# Patient Record
Sex: Male | Born: 1940
Health system: Southern US, Community
[De-identification: ages and names within clinical notes are randomized; demographics above are authoritative.]

## PROBLEM LIST (undated history)

## (undated) DIAGNOSIS — Z9289 Personal history of other medical treatment: Secondary | ICD-10-CM

## (undated) DIAGNOSIS — E781 Pure hyperglyceridemia: Secondary | ICD-10-CM

## (undated) DIAGNOSIS — I4891 Unspecified atrial fibrillation: Secondary | ICD-10-CM

## (undated) DIAGNOSIS — I350 Nonrheumatic aortic (valve) stenosis: Secondary | ICD-10-CM

## (undated) DIAGNOSIS — A0472 Enterocolitis due to Clostridium difficile, not specified as recurrent: Secondary | ICD-10-CM

## (undated) DIAGNOSIS — N183 Chronic kidney disease, stage 3 unspecified: Secondary | ICD-10-CM

## (undated) DIAGNOSIS — R011 Cardiac murmur, unspecified: Secondary | ICD-10-CM

## (undated) DIAGNOSIS — L400 Psoriasis vulgaris: Secondary | ICD-10-CM

## (undated) DIAGNOSIS — E785 Hyperlipidemia, unspecified: Secondary | ICD-10-CM

## (undated) DIAGNOSIS — R0600 Dyspnea, unspecified: Secondary | ICD-10-CM

## (undated) DIAGNOSIS — E1151 Type 2 diabetes mellitus with diabetic peripheral angiopathy without gangrene: Secondary | ICD-10-CM

## (undated) DIAGNOSIS — M1990 Unspecified osteoarthritis, unspecified site: Secondary | ICD-10-CM

## (undated) DIAGNOSIS — I509 Heart failure, unspecified: Secondary | ICD-10-CM

## (undated) DIAGNOSIS — Z860100 Personal history of colon polyps, unspecified: Secondary | ICD-10-CM

## (undated) DIAGNOSIS — I739 Peripheral vascular disease, unspecified: Secondary | ICD-10-CM

## (undated) DIAGNOSIS — K5520 Angiodysplasia of colon without hemorrhage: Secondary | ICD-10-CM

## (undated) DIAGNOSIS — I480 Paroxysmal atrial fibrillation: Secondary | ICD-10-CM

## (undated) DIAGNOSIS — K5521 Angiodysplasia of colon with hemorrhage: Secondary | ICD-10-CM

## (undated) DIAGNOSIS — I808 Phlebitis and thrombophlebitis of other sites: Secondary | ICD-10-CM

## (undated) DIAGNOSIS — E119 Type 2 diabetes mellitus without complications: Secondary | ICD-10-CM

## (undated) DIAGNOSIS — R7989 Other specified abnormal findings of blood chemistry: Secondary | ICD-10-CM

## (undated) DIAGNOSIS — I771 Stricture of artery: Secondary | ICD-10-CM

## (undated) DIAGNOSIS — I779 Disorder of arteries and arterioles, unspecified: Secondary | ICD-10-CM

## (undated) DIAGNOSIS — E039 Hypothyroidism, unspecified: Secondary | ICD-10-CM

## (undated) DIAGNOSIS — I2510 Atherosclerotic heart disease of native coronary artery without angina pectoris: Secondary | ICD-10-CM

## (undated) DIAGNOSIS — B958 Unspecified staphylococcus as the cause of diseases classified elsewhere: Secondary | ICD-10-CM

## (undated) DIAGNOSIS — L409 Psoriasis, unspecified: Secondary | ICD-10-CM

## (undated) DIAGNOSIS — I6529 Occlusion and stenosis of unspecified carotid artery: Secondary | ICD-10-CM

## (undated) DIAGNOSIS — I499 Cardiac arrhythmia, unspecified: Secondary | ICD-10-CM

## (undated) DIAGNOSIS — D509 Iron deficiency anemia, unspecified: Secondary | ICD-10-CM

## (undated) DIAGNOSIS — I5032 Chronic diastolic (congestive) heart failure: Secondary | ICD-10-CM

## (undated) DIAGNOSIS — I10 Essential (primary) hypertension: Secondary | ICD-10-CM

## (undated) DIAGNOSIS — R7881 Bacteremia: Secondary | ICD-10-CM

## (undated) DIAGNOSIS — T7840XA Allergy, unspecified, initial encounter: Secondary | ICD-10-CM

## (undated) DIAGNOSIS — I4819 Other persistent atrial fibrillation: Secondary | ICD-10-CM

## (undated) DIAGNOSIS — I214 Non-ST elevation (NSTEMI) myocardial infarction: Secondary | ICD-10-CM

## (undated) DIAGNOSIS — I5033 Acute on chronic diastolic (congestive) heart failure: Secondary | ICD-10-CM

## (undated) DIAGNOSIS — K219 Gastro-esophageal reflux disease without esophagitis: Secondary | ICD-10-CM

## (undated) HISTORY — DX: Chronic diastolic (congestive) heart failure: I50.32

## (undated) HISTORY — DX: Psoriasis vulgaris: L40.0

## (undated) HISTORY — DX: Personal history of colon polyps, unspecified: Z86.0100

## (undated) HISTORY — DX: Peripheral vascular disease, unspecified: I73.9

## (undated) HISTORY — DX: Acute on chronic diastolic (congestive) heart failure: I50.33

## (undated) HISTORY — DX: Nonrheumatic aortic (valve) stenosis: I35.0

## (undated) HISTORY — DX: Occlusion and stenosis of unspecified carotid artery: I65.29

## (undated) HISTORY — DX: Phlebitis and thrombophlebitis of other sites: I80.8

## (undated) HISTORY — DX: Stricture of artery: I77.1

## (undated) HISTORY — DX: Bacteremia: R78.81

## (undated) HISTORY — DX: Personal history of colonic polyps: Z86.010

## (undated) HISTORY — DX: Pure hyperglyceridemia: E78.1

## (undated) HISTORY — DX: Allergy, unspecified, initial encounter: T78.40XA

## (undated) HISTORY — DX: Unspecified osteoarthritis, unspecified site: M19.90

## (undated) HISTORY — DX: Paroxysmal atrial fibrillation: I48.0

## (undated) HISTORY — DX: Type 2 diabetes mellitus with diabetic peripheral angiopathy without gangrene: E11.51

## (undated) HISTORY — PX: KNEE ARTHROSCOPY: SUR90

## (undated) HISTORY — DX: Hypothyroidism, unspecified: E03.9

## (undated) HISTORY — DX: Angiodysplasia of colon with hemorrhage: K55.21

## (undated) HISTORY — DX: Other persistent atrial fibrillation: I48.19

## (undated) HISTORY — DX: Enterocolitis due to Clostridium difficile, not specified as recurrent: A04.72

## (undated) HISTORY — DX: Personal history of other medical treatment: Z92.89

## (undated) HISTORY — DX: Hyperlipidemia, unspecified: E78.5

## (undated) HISTORY — PX: SHOULDER OPEN ROTATOR CUFF REPAIR: SHX2407

## (undated) HISTORY — DX: Essential (primary) hypertension: I10

## (undated) HISTORY — DX: Other specified abnormal findings of blood chemistry: R79.89

## (undated) HISTORY — DX: Type 2 diabetes mellitus without complications: E11.9

## (undated) HISTORY — DX: Angiodysplasia of colon without hemorrhage: K55.20

## (undated) HISTORY — DX: Iron deficiency anemia, unspecified: D50.9

## (undated) HISTORY — DX: Atherosclerotic heart disease of native coronary artery without angina pectoris: I25.10

---

## 2001-05-13 ENCOUNTER — Encounter: Payer: Self-pay | Admitting: Emergency Medicine

## 2001-05-13 ENCOUNTER — Emergency Department (HOSPITAL_COMMUNITY): Admission: EM | Admit: 2001-05-13 | Discharge: 2001-05-13 | Payer: Self-pay | Admitting: Emergency Medicine

## 2003-11-29 ENCOUNTER — Emergency Department (HOSPITAL_COMMUNITY): Admission: EM | Admit: 2003-11-29 | Discharge: 2003-11-29 | Payer: Self-pay | Admitting: Emergency Medicine

## 2003-12-04 ENCOUNTER — Ambulatory Visit (HOSPITAL_COMMUNITY): Admission: RE | Admit: 2003-12-04 | Discharge: 2003-12-04 | Payer: Self-pay | Admitting: Specialist

## 2006-03-19 ENCOUNTER — Ambulatory Visit: Payer: Self-pay | Admitting: Internal Medicine

## 2006-04-09 ENCOUNTER — Ambulatory Visit: Payer: Self-pay | Admitting: Internal Medicine

## 2006-04-09 ENCOUNTER — Encounter (INDEPENDENT_AMBULATORY_CARE_PROVIDER_SITE_OTHER): Payer: Self-pay | Admitting: Specialist

## 2006-04-09 HISTORY — PX: COLONOSCOPY W/ POLYPECTOMY: SHX1380

## 2007-06-03 ENCOUNTER — Ambulatory Visit: Payer: Self-pay | Admitting: Internal Medicine

## 2007-06-17 ENCOUNTER — Encounter: Payer: Self-pay | Admitting: Internal Medicine

## 2007-06-17 ENCOUNTER — Ambulatory Visit: Payer: Self-pay | Admitting: Internal Medicine

## 2007-06-17 HISTORY — PX: COLONOSCOPY W/ POLYPECTOMY: SHX1380

## 2007-08-21 ENCOUNTER — Emergency Department (HOSPITAL_COMMUNITY): Admission: EM | Admit: 2007-08-21 | Discharge: 2007-08-21 | Payer: Self-pay | Admitting: Emergency Medicine

## 2008-06-06 ENCOUNTER — Emergency Department (HOSPITAL_COMMUNITY): Admission: EM | Admit: 2008-06-06 | Discharge: 2008-06-06 | Payer: Self-pay | Admitting: Emergency Medicine

## 2009-08-29 ENCOUNTER — Ambulatory Visit (HOSPITAL_COMMUNITY): Admission: RE | Admit: 2009-08-29 | Discharge: 2009-08-30 | Payer: Self-pay | Admitting: Orthopedic Surgery

## 2010-02-03 ENCOUNTER — Emergency Department (HOSPITAL_COMMUNITY): Admission: EM | Admit: 2010-02-03 | Discharge: 2010-02-03 | Payer: Self-pay | Admitting: Emergency Medicine

## 2010-06-18 ENCOUNTER — Encounter (INDEPENDENT_AMBULATORY_CARE_PROVIDER_SITE_OTHER): Payer: Self-pay | Admitting: *Deleted

## 2010-08-26 NOTE — Letter (Signed)
Summary: Colonoscopy Letter  [facility]  [place], [place] [address]   Phone: [phone]  Fax: [phone]      June 18, 2010 MRN: [id number]   MAKO ZHAO [address] [place],   [address]   Dear Mr. CORONA,   According to your medical record, it is time for you to schedule a Colonoscopy. The American Cancer Society recommends this procedure as a method to detect early colon cancer. Patients with a family history of colon cancer, or a personal history of colon polyps or inflammatory bowel disease are at increased risk.  This letter has been generated based on the recommendations made at the time of your procedure. If you feel that in your particular situation this may no longer apply, please contact our office.  Please call our office at [phone] to schedule this appointment or to update your records at your earliest convenience.  Thank you for cooperating with [name] to provide you with the very best care possible.   Sincerely,   Gatha Mayer, M.D.  [facility] [phone]

## 2010-10-16 LAB — CBC
HCT: 32.4 % — ABNORMAL LOW (ref 39.0–52.0)
MCHC: 37 g/dL — ABNORMAL HIGH (ref 30.0–36.0)
MCV: 81.9 fL (ref 78.0–100.0)
Platelets: 257 10*3/uL (ref 150–400)

## 2010-10-16 LAB — URINALYSIS, ROUTINE W REFLEX MICROSCOPIC
Bilirubin Urine: NEGATIVE
Glucose, UA: NEGATIVE mg/dL
Hgb urine dipstick: NEGATIVE
Ketones, ur: NEGATIVE mg/dL
Protein, ur: NEGATIVE mg/dL
Urobilinogen, UA: 0.2 mg/dL (ref 0.0–1.0)

## 2010-10-16 LAB — DIFFERENTIAL
Basophils Absolute: 0.1 10*3/uL (ref 0.0–0.1)
Eosinophils Relative: 2 % (ref 0–5)
Lymphocytes Relative: 17 % (ref 12–46)
Lymphs Abs: 0.8 10*3/uL (ref 0.7–4.0)
Neutro Abs: 3.2 10*3/uL (ref 1.7–7.7)

## 2010-10-16 LAB — APTT: aPTT: 33 seconds (ref 24–37)

## 2010-10-16 LAB — GLUCOSE, CAPILLARY
Glucose-Capillary: 163 mg/dL — ABNORMAL HIGH (ref 70–99)
Glucose-Capillary: 217 mg/dL — ABNORMAL HIGH (ref 70–99)

## 2010-10-16 LAB — COMPREHENSIVE METABOLIC PANEL
AST: 19 U/L (ref 0–37)
Albumin: 3.9 g/dL (ref 3.5–5.2)
BUN: 18 mg/dL (ref 6–23)
Calcium: 8.8 mg/dL (ref 8.4–10.5)
Chloride: 99 mEq/L (ref 96–112)
Creatinine, Ser: 0.94 mg/dL (ref 0.4–1.5)
GFR calc Af Amer: >60 mL/min (ref >60)
GFR calc non Af Amer: >60 mL/min (ref >60)
Total Bilirubin: 0.5 mg/dL (ref 0.3–1.2)

## 2010-10-16 LAB — PROTIME-INR: Prothrombin Time: 13.3 seconds (ref 11.6–15.2)

## 2011-01-02 ENCOUNTER — Encounter: Payer: Self-pay | Admitting: Internal Medicine

## 2011-01-27 ENCOUNTER — Ambulatory Visit (AMBULATORY_SURGERY_CENTER): Payer: Medicare Other | Admitting: *Deleted

## 2011-01-27 VITALS — Ht 71.0 in | Wt 254.3 lb

## 2011-01-27 DIAGNOSIS — Z860100 Personal history of colon polyps, unspecified: Secondary | ICD-10-CM

## 2011-01-27 DIAGNOSIS — Z1211 Encounter for screening for malignant neoplasm of colon: Secondary | ICD-10-CM

## 2011-01-27 MED ORDER — PEG-KCL-NACL-NASULF-NA ASC-C 100 G PO SOLR: ORAL | Status: DC

## 2011-02-09 ENCOUNTER — Telehealth: Payer: Self-pay | Admitting: Internal Medicine

## 2011-02-09 NOTE — Telephone Encounter (Signed)
Patient's wife states he had [place] food on Saturday and hot chocolate mix with water this am, no food this morning. Wife was asking could he still have exam. Explained to wife that as long as he starts clear liquid today and no solid foods today he should be okay to have exam. Explained to take prep like instructed and push fluids today and tonight. She understands. Sundra Aland

## 2011-02-10 ENCOUNTER — Encounter: Payer: Self-pay | Admitting: Internal Medicine

## 2011-02-10 ENCOUNTER — Ambulatory Visit (AMBULATORY_SURGERY_CENTER): Payer: Medicare Other | Admitting: Internal Medicine

## 2011-02-10 VITALS — BP 153/72 | HR 84 | Temp 98.5°F | Resp 18 | Ht 71.0 in | Wt 254.0 lb

## 2011-02-10 DIAGNOSIS — Z86010 Personal history of colonic polyps: Secondary | ICD-10-CM

## 2011-02-10 DIAGNOSIS — Z1211 Encounter for screening for malignant neoplasm of colon: Secondary | ICD-10-CM

## 2011-02-10 DIAGNOSIS — K648 Other hemorrhoids: Secondary | ICD-10-CM

## 2011-02-10 HISTORY — PX: COLONOSCOPY: SHX174

## 2011-02-10 MED ORDER — SODIUM CHLORIDE 0.9 % IV SOLN: 500.0000 mL | INTRAVENOUS | Status: DC

## 2011-02-10 NOTE — Patient Instructions (Signed)
Please refer to your blue and neon green sheets for instructions regarding diet and activity for the rest of today.  You may resume your medications as you would normally take them.  

## 2011-02-11 ENCOUNTER — Telehealth: Payer: Self-pay

## 2011-02-11 NOTE — Telephone Encounter (Signed)

## 2011-04-17 LAB — BASIC METABOLIC PANEL
CO2: 26
Chloride: 106
GFR calc Af Amer: >60
Potassium: 4.3
Sodium: 138

## 2011-09-29 DIAGNOSIS — R5383 Other fatigue: Secondary | ICD-10-CM | POA: Diagnosis not present

## 2011-09-29 DIAGNOSIS — Z6835 Body mass index (BMI) 35.0-35.9, adult: Secondary | ICD-10-CM | POA: Diagnosis not present

## 2011-09-29 DIAGNOSIS — E119 Type 2 diabetes mellitus without complications: Secondary | ICD-10-CM | POA: Diagnosis not present

## 2011-09-29 DIAGNOSIS — R5381 Other malaise: Secondary | ICD-10-CM | POA: Diagnosis not present

## 2011-09-29 DIAGNOSIS — L218 Other seborrheic dermatitis: Secondary | ICD-10-CM | POA: Diagnosis not present

## 2011-09-29 DIAGNOSIS — E039 Hypothyroidism, unspecified: Secondary | ICD-10-CM | POA: Diagnosis not present

## 2011-12-21 ENCOUNTER — Encounter (HOSPITAL_COMMUNITY): Payer: Self-pay | Admitting: Emergency Medicine

## 2011-12-21 ENCOUNTER — Inpatient Hospital Stay (HOSPITAL_COMMUNITY)
Admission: EM | Admit: 2011-12-21 | Discharge: 2011-12-23 | DRG: 812 | Disposition: A | Discharge status: Home / Self Care | Payer: Medicare Other | Attending: Internal Medicine | Admitting: Internal Medicine

## 2011-12-21 DIAGNOSIS — D649 Anemia, unspecified: Secondary | ICD-10-CM

## 2011-12-21 DIAGNOSIS — E1151 Type 2 diabetes mellitus with diabetic peripheral angiopathy without gangrene: Secondary | ICD-10-CM | POA: Diagnosis present

## 2011-12-21 DIAGNOSIS — E785 Hyperlipidemia, unspecified: Secondary | ICD-10-CM | POA: Diagnosis present

## 2011-12-21 DIAGNOSIS — E079 Disorder of thyroid, unspecified: Secondary | ICD-10-CM

## 2011-12-21 DIAGNOSIS — D509 Iron deficiency anemia, unspecified: Secondary | ICD-10-CM | POA: Diagnosis not present

## 2011-12-21 DIAGNOSIS — E869 Volume depletion, unspecified: Secondary | ICD-10-CM | POA: Diagnosis present

## 2011-12-21 DIAGNOSIS — E039 Hypothyroidism, unspecified: Secondary | ICD-10-CM | POA: Diagnosis present

## 2011-12-21 DIAGNOSIS — E119 Type 2 diabetes mellitus without complications: Secondary | ICD-10-CM | POA: Diagnosis not present

## 2011-12-21 DIAGNOSIS — E669 Obesity, unspecified: Secondary | ICD-10-CM | POA: Diagnosis present

## 2011-12-21 DIAGNOSIS — Z860100 Personal history of colon polyps, unspecified: Secondary | ICD-10-CM

## 2011-12-21 DIAGNOSIS — M109 Gout, unspecified: Secondary | ICD-10-CM | POA: Diagnosis present

## 2011-12-21 DIAGNOSIS — I10 Essential (primary) hypertension: Secondary | ICD-10-CM | POA: Diagnosis present

## 2011-12-21 DIAGNOSIS — Z7982 Long term (current) use of aspirin: Secondary | ICD-10-CM

## 2011-12-21 DIAGNOSIS — Z6832 Body mass index (BMI) 32.0-32.9, adult: Secondary | ICD-10-CM

## 2011-12-21 DIAGNOSIS — Z79899 Other long term (current) drug therapy: Secondary | ICD-10-CM

## 2011-12-21 DIAGNOSIS — R197 Diarrhea, unspecified: Secondary | ICD-10-CM

## 2011-12-21 DIAGNOSIS — M129 Arthropathy, unspecified: Secondary | ICD-10-CM | POA: Diagnosis present

## 2011-12-21 DIAGNOSIS — R000 Tachycardia, unspecified: Secondary | ICD-10-CM | POA: Diagnosis present

## 2011-12-21 DIAGNOSIS — I959 Hypotension, unspecified: Secondary | ICD-10-CM | POA: Diagnosis not present

## 2011-12-21 MED ORDER — SODIUM CHLORIDE 0.9 % IV BOLUS (SEPSIS)
1000.0000 mL | Freq: Once | INTRAVENOUS | Status: AC
  Administered 2011-12-22: 1000 mL via INTRAVENOUS

## 2011-12-21 MED ORDER — LOPERAMIDE HCL 2 MG PO CAPS
4.0000 mg | ORAL_CAPSULE | Freq: Once | ORAL | Status: AC
  Administered 2011-12-22: 4 mg via ORAL
  Filled 2011-12-21: qty 2

## 2011-12-21 NOTE — ED Provider Notes (Signed)
History     CSN: [id number]  Arrival date & time 12/21/11  1956   First MD Initiated Contact with Patient 12/21/11 2320      Chief Complaint  Patient presents with  . Dizziness    HPI  History provided by the patient and wife. Patient is a 71 year old male with history of hypertension, hyperlipidemia, diabetes who presents with complaints of lightheadedness and low blood pressure. Symptoms began earlier today and have been persistent. Patient also reports having history of multiple episodes of diarrhea over the past 2 days. Patient has been on several times an hour with loose watery stools. He denies any blood or mucus. Patient has had 2 episodes of diarrhea since waiting in the emergency room. His symptoms have been associated with slight nausea but he denies any other associated symptoms. Patient denies any fever, chills, sweats, vomiting or abdominal pains. Patient denies any chest pain or heart palpitations. Patient has continued to drink well. Patient is also taking his normal blood pressure medications regularly.    Past Medical History  Diagnosis Date  . Diabetes mellitus   . Hypertension   . Hyperlipidemia   . Gout   . Arthritis   . Thyroid disease     hypothyroidism  . Personal history of colonic polyps 2007, 2008    adenoma each time, largest 12 mm in 2007    Past Surgical History  Procedure Date  . Rotator cuff repair     left  . Knee arthroscopy     right  . Colonoscopy w/ polypectomy 04/09/2006    12 mm adenoma  . Colonoscopy w/ polypectomy 06/17/2007    5 mm adenoma  . Colonoscopy 02/10/2011    internal hemorrhoids    History reviewed. No pertinent family history.  History  Substance Use Topics  . Smoking status: Former Smoker    Quit date: 03/29/2006  . Smokeless tobacco: Not on file  . Alcohol Use: 0.6 oz/week    1 Cans of beer per week     occasional alcohol intake      Review of Systems  Constitutional: Positive for fever. Negative for  chills.  Respiratory: Negative for cough and shortness of breath.   Cardiovascular: Negative for chest pain.  Gastrointestinal: Positive for diarrhea. Negative for nausea, vomiting, abdominal pain and constipation.  Skin: Negative for rash.  Neurological: Positive for dizziness, light-headedness and headaches.    Allergies  Percocet  Home Medications   Current Outpatient Rx  Name Route Sig Dispense Refill  . ASPIRIN 81 MG PO TABS Oral Take 81 mg by mouth daily.    [name] CLONIDINE HCL 0.1 MG PO TABS Oral Take 0.1 mg by mouth 2 (two) times daily.    . IRON 325 (65 FE) MG PO TABS Oral Take 1 tablet by mouth daily.    [name] GLIPIZIDE 10 MG PO TABS Oral Take 10 mg by mouth daily.      [name] KRILL OIL 500 MG PO CAPS Oral Take 1 capsule by mouth daily.      [name] LEVOTHYROXINE SODIUM 150 MCG PO TABS Oral Take 150 mcg by mouth daily.    [name] LISINOPRIL-HYDROCHLOROTHIAZIDE 20-12.5 MG PO TABS Oral Take 1 tablet by mouth 2 (two) times daily.      [name] METFORMIN HCL 1000 MG PO TABS Oral Take 1,000 mg by mouth 2 (two) times daily with a meal.      . RED YEAST RICE PO Oral Take 1 tablet by mouth daily.  BP 116/63  Pulse 99  Temp(Src) 98.1 F (36.7 C) (Oral)  Resp 20  SpO2 99%  Physical Exam  Nursing note and vitals reviewed. Constitutional: He is oriented to person, place, and time. He appears well-developed and well-nourished. No distress.  HENT:  Head: Normocephalic and atraumatic.  Neck: Normal range of motion. Neck supple.       No meningeal signs  Cardiovascular: Normal rate and regular rhythm.   No murmur heard. Pulmonary/Chest: Effort normal and breath sounds normal.  Abdominal: Soft. He exhibits no distension. There is no tenderness. There is no rebound and no guarding.       Obese  Musculoskeletal: He exhibits no tenderness.  Neurological: He is alert and oriented to person, place, and time. He has normal strength. No cranial nerve deficit or sensory deficit.  Skin: Skin is warm. There is  pallor.  Psychiatric: He has a normal mood and affect. His behavior is normal.    ED Course  Procedures   Results for orders placed during the hospital encounter of 12/21/11  CBC      Component Value Range   WBC 5.3  4.0 - 10.5 (K/uL)   RBC 3.24 (*) 4.22 - 5.81 (MIL/uL)   Hemoglobin 8.5 (*) 13.0 - 17.0 (g/dL)   HCT 25.8 (*) 39.0 - 52.0 (%)   MCV 79.6  78.0 - 100.0 (fL)   MCH 26.2  26.0 - 34.0 (pg)   MCHC 32.9  30.0 - 36.0 (g/dL)   RDW 16.9 (*) 11.5 - 15.5 (%)   Platelets 206  150 - 400 (K/uL)  DIFFERENTIAL      Component Value Range   Neutrophils Relative 73  43 - 77 (%)   Neutro Abs 3.9  1.7 - 7.7 (K/uL)   Lymphocytes Relative 15  12 - 46 (%)   Lymphs Abs 0.8  0.7 - 4.0 (K/uL)   Monocytes Relative 9  3 - 12 (%)   Monocytes Absolute 0.5  0.1 - 1.0 (K/uL)   Eosinophils Relative 2  0 - 5 (%)   Eosinophils Absolute 0.1  0.0 - 0.7 (K/uL)   Basophils Relative 1  0 - 1 (%)   Basophils Absolute 0.0  0.0 - 0.1 (K/uL)  COMPREHENSIVE METABOLIC PANEL      Component Value Range   Sodium 138  135 - 145 (mEq/L)   Potassium 4.1  3.5 - 5.1 (mEq/L)   Chloride 103  96 - 112 (mEq/L)   CO2 22  19 - 32 (mEq/L)   Glucose, Bld 141 (*) 70 - 99 (mg/dL)   BUN 31 (*) 6 - 23 (mg/dL)   Creatinine, Ser 1.16  0.50 - 1.35 (mg/dL)   Calcium 9.1  8.4 - 10.5 (mg/dL)   Total Protein 7.2  6.0 - 8.3 (g/dL)   Albumin 3.5  3.5 - 5.2 (g/dL)   AST 14  0 - 37 (U/L)   ALT 10  0 - 53 (U/L)   Alkaline Phosphatase 93  39 - 117 (U/L)   Total Bilirubin 0.2 (*) 0.3 - 1.2 (mg/dL)   GFR calc non Af Amer 62 (*) >90 (mL/min)   GFR calc Af Amer 72 (*) >90 (mL/min)  BASIC METABOLIC PANEL      Component Value Range   Sodium 136  135 - 145 (mEq/L)   Potassium 4.2  3.5 - 5.1 (mEq/L)   Chloride 103  96 - 112 (mEq/L)   CO2 22  19 - 32 (mEq/L)   Glucose, Bld 186 (*)  70 - 99 (mg/dL)   BUN 26 (*) 6 - 23 (mg/dL)   Creatinine, Ser 1.07  0.50 - 1.35 (mg/dL)   Calcium 8.5  8.4 - 10.5 (mg/dL)   GFR calc non Af Amer 69 (*) >90  (mL/min)   GFR calc Af Amer 80 (*) >90 (mL/min)  TSH      Component Value Range   TSH 3.880  0.350 - 4.500 (uIU/mL)  VITAMIN B12      Component Value Range   Vitamin B-12 324  211 - 911 (pg/mL)  FOLATE      Component Value Range   Folate 10.3    IRON AND TIBC      Component Value Range   Iron 21 (*) 42 - 135 (ug/dL)   TIBC 265  215 - 435 (ug/dL)   Saturation Ratios 8 (*) 20 - 55 (%)   UIBC 244  125 - 400 (ug/dL)  RETICULOCYTES      Component Value Range   Retic Ct Pct 3.7 (*) 0.4 - 3.1 (%)   RBC. 2.81 (*) 4.22 - 5.81 (MIL/uL)   Retic Count, Manual 104.0  19.0 - 186.0 (K/uL)  CBC      Component Value Range   WBC 4.0  4.0 - 10.5 (K/uL)   RBC 2.81 (*) 4.22 - 5.81 (MIL/uL)   Hemoglobin 7.5 (*) 13.0 - 17.0 (g/dL)   HCT 22.3 (*) 39.0 - 52.0 (%)   MCV 79.4  78.0 - 100.0 (fL)   MCH 26.7  26.0 - 34.0 (pg)   MCHC 33.6  30.0 - 36.0 (g/dL)   RDW 17.0 (*) 11.5 - 15.5 (%)   Platelets 171  150 - 400 (K/uL)  FERRITIN      Component Value Range   Ferritin 557 (*) 22 - 322 (ng/mL)  PREPARE RBC (CROSSMATCH)      Component Value Range   Order Confirmation ORDER PROCESSED BY BLOOD BANK    TYPE AND SCREEN      Component Value Range   ABO/RH(D) A POS     Antibody Screen NEG     Sample Expiration 12/25/2011     Unit Number [id number]     Blood Component Type RED CELLS,LR     Unit division 00     Status of Unit ISSUED     Transfusion Status OK TO TRANSFUSE     Crossmatch Result Compatible     Unit Number [id number]     Blood Component Type RED CELLS,LR     Unit division 00     Status of Unit ISSUED     Transfusion Status OK TO TRANSFUSE     Crossmatch Result Compatible    ABO/RH      Component Value Range   ABO/RH(D) A POS    MAGNESIUM      Component Value Range   Magnesium 1.7  1.5 - 2.5 (mg/dL)  OCCULT BLOOD, POC DEVICE      Component Value Range   Fecal Occult Bld NEGATIVE         Ct Abdomen Pelvis Wo Contrast  12/22/2011  *RADIOLOGY REPORT*  Clinical Data: Severe  diarrhea and unexplained anemia. Hypotension.  CT ABDOMEN AND PELVIS WITHOUT CONTRAST  Technique:  Multidetector CT imaging of the abdomen and pelvis was performed following the standard protocol without intravenous contrast.  Comparison: None.  Findings: The lung bases are clear.  The unenhanced appearance of the liver, spleen, pancreas, adrenal glands, kidneys, abdominal aorta, and retroperitoneal lymph nodes is unremarkable.  There appear to be a vague low attenuation changes in the kidneys likely representing small cysts.  Cholelithiasis with large stones in the gallbladder neck region.  No gallbladder distension or and edema. The stomach and small bowel are decompressed.  Stool filled colon without distension.  No free air or free fluid in the abdomen. Prominent visceral adipose tissues.  Pelvis:  Calcification of the prostate gland without significant enlargement.  No bladder wall thickening.  Diverticula in the sigmoid colon without inflammation.  No free or loculated pelvic fluid collections.  The appendix is normal.  No retroperitoneal hematomas.  Normal alignment of the lumbar vertebrae with mild degenerative change.  IMPRESSION: Cholelithiasis.  The no acute process demonstrated in the abdomen pelvis.  No evidence of retroperitoneal or abdominal hematoma.  Original Report Authenticated By: Neale Burly, M.D.     1. Anemia   2. Diarrhea   3. DM (diabetes mellitus)   4. Iron deficiency anemia, unspecified       MDM  Patient seen and evaluated. Patient in no acute distress.  Patient with slight improvement of symptoms with fluids but still feeling very weak and fatigued. Hemoglobin low at concerning. Patient with negative Hemoccult. Patient discussed with attending physician and will plan for admission.   Spoke with triad hospitalist. They'll see patient and admit.     Date: 12/22/2011  Rate: 90  Rhythm: normal sinus rhythm  QRS Axis: normal  Intervals: normal  ST/T Wave  abnormalities: nonspecific T wave changes  Conduction Disutrbances:none  Narrative Interpretation:   Old EKG Reviewed: unchanged from 08/28/2009    Martie Lee, [place] 12/22/11 2315

## 2011-12-21 NOTE — ED Notes (Signed)
Pt family states pt was out in the yard and became dizzy so she took him in the house and checked his blood sugar which was normal and so she checked his blood pressure and thought it to be low  Pt states he continues to have light headedness   Pt is also c/o headache and states on the way here he started having some nausea

## 2011-12-21 NOTE — ED Notes (Signed)
Pt also reveals that he has been having more urgency with his urine lately as well.

## 2011-12-21 NOTE — ED Notes (Signed)
Pt sts he has been having severe diarrhea x 4 days. Was feeling weak earlier today with lower than normal blood pressure. Sts that he has been feeling nauseous today as well, no emesis. NAD. O2 sat 100% on room air. No other complaints at this time.

## 2011-12-22 ENCOUNTER — Inpatient Hospital Stay (HOSPITAL_COMMUNITY): Payer: Medicare Other

## 2011-12-22 ENCOUNTER — Encounter (HOSPITAL_COMMUNITY): Payer: Self-pay | Admitting: Internal Medicine

## 2011-12-22 DIAGNOSIS — E1151 Type 2 diabetes mellitus with diabetic peripheral angiopathy without gangrene: Secondary | ICD-10-CM | POA: Diagnosis present

## 2011-12-22 DIAGNOSIS — I959 Hypotension, unspecified: Secondary | ICD-10-CM | POA: Diagnosis not present

## 2011-12-22 DIAGNOSIS — R197 Diarrhea, unspecified: Secondary | ICD-10-CM

## 2011-12-22 DIAGNOSIS — Z7982 Long term (current) use of aspirin: Secondary | ICD-10-CM | POA: Diagnosis not present

## 2011-12-22 DIAGNOSIS — E869 Volume depletion, unspecified: Secondary | ICD-10-CM | POA: Diagnosis present

## 2011-12-22 DIAGNOSIS — I10 Essential (primary) hypertension: Secondary | ICD-10-CM | POA: Diagnosis present

## 2011-12-22 DIAGNOSIS — E119 Type 2 diabetes mellitus without complications: Secondary | ICD-10-CM

## 2011-12-22 DIAGNOSIS — M129 Arthropathy, unspecified: Secondary | ICD-10-CM | POA: Diagnosis present

## 2011-12-22 DIAGNOSIS — M109 Gout, unspecified: Secondary | ICD-10-CM | POA: Diagnosis present

## 2011-12-22 DIAGNOSIS — E669 Obesity, unspecified: Secondary | ICD-10-CM | POA: Diagnosis present

## 2011-12-22 DIAGNOSIS — D509 Iron deficiency anemia, unspecified: Secondary | ICD-10-CM | POA: Diagnosis not present

## 2011-12-22 DIAGNOSIS — E079 Disorder of thyroid, unspecified: Secondary | ICD-10-CM | POA: Diagnosis present

## 2011-12-22 DIAGNOSIS — E860 Dehydration: Secondary | ICD-10-CM | POA: Diagnosis not present

## 2011-12-22 DIAGNOSIS — Z79899 Other long term (current) drug therapy: Secondary | ICD-10-CM | POA: Diagnosis not present

## 2011-12-22 DIAGNOSIS — Z86010 Personal history of colonic polyps: Secondary | ICD-10-CM | POA: Diagnosis not present

## 2011-12-22 DIAGNOSIS — R112 Nausea with vomiting, unspecified: Secondary | ICD-10-CM | POA: Diagnosis not present

## 2011-12-22 DIAGNOSIS — R42 Dizziness and giddiness: Secondary | ICD-10-CM | POA: Diagnosis not present

## 2011-12-22 DIAGNOSIS — E039 Hypothyroidism, unspecified: Secondary | ICD-10-CM | POA: Diagnosis present

## 2011-12-22 DIAGNOSIS — E785 Hyperlipidemia, unspecified: Secondary | ICD-10-CM | POA: Diagnosis present

## 2011-12-22 DIAGNOSIS — D649 Anemia, unspecified: Secondary | ICD-10-CM | POA: Diagnosis not present

## 2011-12-22 DIAGNOSIS — R000 Tachycardia, unspecified: Secondary | ICD-10-CM | POA: Diagnosis present

## 2011-12-22 DIAGNOSIS — K8020 Calculus of gallbladder without cholecystitis without obstruction: Secondary | ICD-10-CM | POA: Diagnosis not present

## 2011-12-22 DIAGNOSIS — Z6832 Body mass index (BMI) 32.0-32.9, adult: Secondary | ICD-10-CM | POA: Diagnosis not present

## 2011-12-22 LAB — VITAMIN B12: Vitamin B-12: 324 pg/mL (ref 211–911)

## 2011-12-22 LAB — RETICULOCYTES
RBC.: 2.81 MIL/uL — ABNORMAL LOW (ref 4.22–5.81)
Retic Count, Absolute: 104 10*3/uL (ref 19.0–186.0)

## 2011-12-22 LAB — DIFFERENTIAL
Basophils Relative: 1 % (ref 0–1)
Lymphocytes Relative: 15 % (ref 12–46)
Lymphs Abs: 0.8 10*3/uL (ref 0.7–4.0)
Monocytes Absolute: 0.5 10*3/uL (ref 0.1–1.0)
Monocytes Relative: 9 % (ref 3–12)
Neutro Abs: 3.9 10*3/uL (ref 1.7–7.7)

## 2011-12-22 LAB — CBC
HCT: 22.3 % — ABNORMAL LOW (ref 39.0–52.0)
HCT: 25.8 % — ABNORMAL LOW (ref 39.0–52.0)
Hemoglobin: 8.5 g/dL — ABNORMAL LOW (ref 13.0–17.0)
MCHC: 32.9 g/dL (ref 30.0–36.0)
RDW: 17 % — ABNORMAL HIGH (ref 11.5–15.5)
WBC: 4 10*3/uL (ref 4.0–10.5)

## 2011-12-22 LAB — IRON AND TIBC: UIBC: 244 ug/dL (ref 125–400)

## 2011-12-22 LAB — COMPREHENSIVE METABOLIC PANEL
BUN: 31 mg/dL — ABNORMAL HIGH (ref 6–23)
CO2: 22 mEq/L (ref 19–32)
Chloride: 103 mEq/L (ref 96–112)
Creatinine, Ser: 1.16 mg/dL (ref 0.50–1.35)
GFR calc Af Amer: 72 mL/min — ABNORMAL LOW (ref >90)
GFR calc non Af Amer: 62 mL/min — ABNORMAL LOW (ref >90)
Glucose, Bld: 141 mg/dL — ABNORMAL HIGH (ref 70–99)
Total Bilirubin: 0.2 mg/dL — ABNORMAL LOW (ref 0.3–1.2)

## 2011-12-22 LAB — BASIC METABOLIC PANEL
BUN: 26 mg/dL — ABNORMAL HIGH (ref 6–23)
Calcium: 8.5 mg/dL (ref 8.4–10.5)
GFR calc Af Amer: 80 mL/min — ABNORMAL LOW (ref >90)
GFR calc non Af Amer: 69 mL/min — ABNORMAL LOW (ref >90)
Potassium: 4.2 mEq/L (ref 3.5–5.1)
Sodium: 136 mEq/L (ref 135–145)

## 2011-12-22 LAB — PREPARE RBC (CROSSMATCH)

## 2011-12-22 LAB — MAGNESIUM: Magnesium: 1.7 mg/dL (ref 1.5–2.5)

## 2011-12-22 LAB — FERRITIN: Ferritin: 557 ng/mL — ABNORMAL HIGH (ref 22–322)

## 2011-12-22 LAB — FOLATE: Folate: 10.3 ng/mL

## 2011-12-22 MED ORDER — DEXTROSE-NACL 5-0.9 % IV SOLN
INTRAVENOUS | Status: DC
  Administered 2011-12-22: 05:00:00 via INTRAVENOUS

## 2011-12-22 MED ORDER — ONDANSETRON HCL 4 MG/2ML IJ SOLN: 4.0000 mg | Freq: Three times a day (TID) | INTRAMUSCULAR | Status: AC | PRN

## 2011-12-22 MED ORDER — PANTOPRAZOLE SODIUM 40 MG PO TBEC
40.0000 mg | DELAYED_RELEASE_TABLET | Freq: Every day | ORAL | Status: DC
  Administered 2011-12-22 – 2011-12-23 (×2): 40 mg via ORAL
  Filled 2011-12-22 (×2): qty 1

## 2011-12-22 MED ORDER — ACETAMINOPHEN 325 MG PO TABS
650.0000 mg | ORAL_TABLET | Freq: Once | ORAL | Status: AC
  Administered 2011-12-22: 650 mg via ORAL
  Filled 2011-12-22: qty 2

## 2011-12-22 MED ORDER — KETOROLAC TROMETHAMINE 30 MG/ML IJ SOLN
30.0000 mg | Freq: Once | INTRAMUSCULAR | Status: AC
  Administered 2011-12-22: 30 mg via INTRAVENOUS
  Filled 2011-12-22: qty 1

## 2011-12-22 MED ORDER — DIPHENHYDRAMINE HCL 50 MG/ML IJ SOLN
25.0000 mg | Freq: Once | INTRAMUSCULAR | Status: AC
  Administered 2011-12-22: 25 mg via INTRAVENOUS
  Filled 2011-12-22: qty 1

## 2011-12-22 MED ORDER — SODIUM CHLORIDE 0.9 % IJ SOLN
3.0000 mL | Freq: Two times a day (BID) | INTRAMUSCULAR | Status: DC
  Administered 2011-12-22 – 2011-12-23 (×3): 3 mL via INTRAVENOUS

## 2011-12-22 MED ORDER — LEVOTHYROXINE SODIUM 150 MCG PO TABS
150.0000 ug | ORAL_TABLET | Freq: Every day | ORAL | Status: DC
  Administered 2011-12-22 – 2011-12-23 (×2): 150 ug via ORAL
  Filled 2011-12-22 (×5): qty 1

## 2011-12-22 MED ORDER — METOCLOPRAMIDE HCL 5 MG/ML IJ SOLN
10.0000 mg | Freq: Once | INTRAMUSCULAR | Status: AC
  Administered 2011-12-22: 10 mg via INTRAVENOUS
  Filled 2011-12-22: qty 2

## 2011-12-22 NOTE — ED Notes (Signed)
Pt hemoccult card neg

## 2011-12-22 NOTE — Progress Notes (Signed)
Pt  Having 3 beats of vt freq  About every minute or so.  Vs 147/64, 99.3, 103, 97% RA, and 18 resp.  infom sent via text page to Dr Broadus John.

## 2011-12-22 NOTE — H&P (Signed)
PCP:   [name], MD, MD   Chief Complaint: Dizziness   HPI: Michael [name] is an 71 y.o. male with history of hypertension on several medications including diuretic, history of diabetes, hyperlipidemia, hypothyroidism on supplement, colonic polyp, iron deficiency anemia on supplement, present to the emergency room as his wife check his blood pressure and found it to be in the 0000000 systolic. He also experienced lightheadedness but no frank syncope. Evaluation in emergency room did confirm hypotension, but responded promptly to IV fluid. He was found to have a hemoglobin of 8.5 g per decaliter, with MCV of 79, and black stool but guaiac negative. He has no abdominal pain, nausea, vomiting, but admitted to having severe diarrhea for several days. He was also found to have slight elevation of BUN to 31, normal creatinine. He has normal platelet count and normal white count as well.  There was no recent CBC to compare. He stated that he had colonoscopy about a year or so ago and no polyps or cancer was found. Hospitalist was asked to admit him for volume depletion, anemia, and hypotension.  Rewiew of Systems:  The patient denies anorexia, fever, weight loss,, vision loss, decreased hearing, hoarseness, chest pain, syncope, dyspnea on exertion, peripheral edema, balance deficits, hemoptysis, abdominal pain, , hematochezia, severe indigestion/heartburn, hematuria, incontinence, genital sores, muscle weakness, suspicious skin lesions, transient blindness, difficulty walking, depression, unusual weight change, abnormal bleeding, enlarged lymph nodes, angioedema, and breast masses.    Past Medical History  Diagnosis Date  . Diabetes mellitus   . Hypertension   . Hyperlipidemia   . Gout   . Arthritis   . Thyroid disease     hypothyroidism  . Personal history of colonic polyps 2007, 2008    adenoma each time, largest 12 mm in 2007    Past Surgical History  Procedure Date  . Rotator cuff repair       left  . Knee arthroscopy     right  . Colonoscopy w/ polypectomy 04/09/2006    12 mm adenoma  . Colonoscopy w/ polypectomy 06/17/2007    5 mm adenoma  . Colonoscopy 02/10/2011    internal hemorrhoids    Medications:  HOME MEDS: Prior to Admission medications   Medication Sig Start Date End Date Taking? Authorizing Provider  aspirin 81 MG tablet Take 81 mg by mouth daily.   Yes Historical Provider, MD  cloNIDine (CATAPRES) 0.1 MG tablet Take 0.1 mg by mouth 2 (two) times daily.   Yes Historical Provider, MD  Ferrous Sulfate (IRON) 325 (65 FE) MG TABS Take 1 tablet by mouth daily.   Yes Historical Provider, MD  glipiZIDE (GLUCOTROL) 10 MG tablet Take 10 mg by mouth daily.     Yes Historical Provider, MD  Javier Docker Oil 500 MG CAPS Take 1 capsule by mouth daily.     Yes Historical Provider, MD  levothyroxine (SYNTHROID, LEVOTHROID) 150 MCG tablet Take 150 mcg by mouth daily.   Yes Historical Provider, MD  lisinopril-hydrochlorothiazide (PRINZIDE,ZESTORETIC) 20-12.5 MG per tablet Take 1 tablet by mouth 2 (two) times daily.     Yes Historical Provider, MD  metFORMIN (GLUCOPHAGE) 1000 MG tablet Take 1,000 mg by mouth 2 (two) times daily with a meal.     Yes Historical Provider, MD  Red Yeast Rice Extract (RED YEAST RICE PO) Take 1 tablet by mouth daily.     Yes Historical Provider, MD     Allergies:  Allergies  Allergen Reactions  . Percocet (Oxycodone-Acetaminophen) Nausea  And Vomiting    Social History:   reports that he quit smoking about 5 years ago. He does not have any smokeless tobacco history on file. He reports that he drinks about .6 ounces of alcohol per week. He reports that he does not use illicit drugs.  Family History: History reviewed. No pertinent family history.   Physical Exam: Filed Vitals:   12/21/11 2307 12/21/11 2308 12/22/11 0419 12/22/11 0430  BP: 99/70 116/63 171/80 150/67  Pulse: 95 99 114   Temp:   98.3 F (36.8 C)   TempSrc:   Oral   Resp:   20    Height:   6' (1.829 m)   Weight:   112.3 kg (247 lb 9.2 oz)   SpO2:   98%    Blood pressure 150/67, pulse 114, temperature 98.3 F (36.8 C), temperature source Oral, resp. rate 20, height 6' (1.829 m), weight 112.3 kg (247 lb 9.2 oz), SpO2 98.00%.  GEN:  Pleasant person lying in the stretcher in no acute distress; cooperative with exam PSYCH:  alert and oriented x4; does not appear anxious or depressed; affect is appropriate. HEENT: Mucous membranes pink and anicteric; PERRLA; EOM intact; no cervical lymphadenopathy nor thyromegaly or carotid bruit; no JVD; Breasts:: Not examined CHEST WALL: No tenderness CHEST: Normal respiration, clear to auscultation bilaterally HEART: Regular rate and rhythm; no murmurs rubs or gallops BACK: No kyphosis or scoliosis; no CVA tenderness ABDOMEN: Obese, soft non-tender; no masses, no organomegaly, normal abdominal bowel sounds; no pannus; no intertriginous candida. Rectal Exam: Per ER, dark stool guaiac-negative. EXTREMITIES: No bone or joint deformity; age-appropriate arthropathy of the hands and knees; no edema; no ulcerations. Genitalia: not examined PULSES: 2+ and symmetric SKIN: Normal hydration no rash or ulceration CNS: Cranial nerves 2-12 grossly intact no focal lateralizing neurologic deficit   Labs & Imaging Results for orders placed during the hospital encounter of 12/21/11 (from the past 48 hour(s))  CBC     Status: Abnormal   Collection Time   12/22/11 12:23 AM      Component Value Range Comment   WBC 5.3  4.0 - 10.5 (K/uL)    RBC 3.24 (*) 4.22 - 5.81 (MIL/uL)    Hemoglobin 8.5 (*) 13.0 - 17.0 (g/dL)    HCT 25.8 (*) 39.0 - 52.0 (%)    MCV 79.6  78.0 - 100.0 (fL)    MCH 26.2  26.0 - 34.0 (pg)    MCHC 32.9  30.0 - 36.0 (g/dL)    RDW 16.9 (*) 11.5 - 15.5 (%)    Platelets 206  150 - 400 (K/uL)   DIFFERENTIAL     Status: Normal   Collection Time   12/22/11 12:23 AM      Component Value Range Comment   Neutrophils Relative 73  43 -  77 (%)    Neutro Abs 3.9  1.7 - 7.7 (K/uL)    Lymphocytes Relative 15  12 - 46 (%)    Lymphs Abs 0.8  0.7 - 4.0 (K/uL)    Monocytes Relative 9  3 - 12 (%)    Monocytes Absolute 0.5  0.1 - 1.0 (K/uL)    Eosinophils Relative 2  0 - 5 (%)    Eosinophils Absolute 0.1  0.0 - 0.7 (K/uL)    Basophils Relative 1  0 - 1 (%)    Basophils Absolute 0.0  0.0 - 0.1 (K/uL)   COMPREHENSIVE METABOLIC PANEL     Status: Abnormal   Collection Time  12/22/11 12:23 AM      Component Value Range Comment   Sodium 138  135 - 145 (mEq/L)    Potassium 4.1  3.5 - 5.1 (mEq/L)    Chloride 103  96 - 112 (mEq/L)    CO2 22  19 - 32 (mEq/L)    Glucose, Bld 141 (*) 70 - 99 (mg/dL)    BUN 31 (*) 6 - 23 (mg/dL)    Creatinine, Ser 1.16  0.50 - 1.35 (mg/dL)    Calcium 9.1  8.4 - 10.5 (mg/dL)    Total Protein 7.2  6.0 - 8.3 (g/dL)    Albumin 3.5  3.5 - 5.2 (g/dL)    AST 14  0 - 37 (U/L)    ALT 10  0 - 53 (U/L)    Alkaline Phosphatase 93  39 - 117 (U/L)    Total Bilirubin 0.2 (*) 0.3 - 1.2 (mg/dL)    GFR calc non Af Amer 62 (*) >90 (mL/min)    GFR calc Af Amer 72 (*) >90 (mL/min)    No results found.    Assessment Present on Admission:  .Hypotension .DM (diabetes mellitus) .Thyroid disease Unexplained anemia Dehydration diarrhea  PLAN: Will admit him to telemetry, and perform serial H&H every 6 hours. I will put him on a PPI. Will do anemia workup. I suspect he'll need a GI consult for possible EGD. Although his abdominal exam is benign, since the anemia is unexplained, I will obtain a plain abdominal CT. Because of his diarrhea, will send stool for study including PCR C. difficile, along with routine stool culture. With dehydration, and receiving IV fluid, it's quite possible his hemoglobin will drop further. With respect to his diabetes, we'll stop his medication given he is made n.p.o., and insulin sliding scale will be given. For his thyroid disease, we'll continue supplements and check TSH. He is a bit  tachycardic, which I suspect is volume depleted, and will follow with fluid replacement. He is stable, full code, and will be admitted to telemetry under triad hospitalist service.  I have stopped his diuretic along with his other antihypertensive medications for now pending further evaluation. I have also stopped his iron in case he needs an EGD.   Other plans as per orders.    Autie Vasudevan 12/22/2011, 4:33 AM

## 2011-12-22 NOTE — Progress Notes (Addendum)
Inpatient Diabetes Program Recommendations  AACE/ADA: New Consensus Statement on Inpatient Glycemic Control (2009)  Target Ranges:  Prepandial:   less than 140 mg/dL      Peak postprandial:   less than 180 mg/dL (1-2 hours)      Critically ill patients:  140 - 180 mg/dL   Reason for Visit: Pt has diabetes and takes Glipizide at home.  Inpatient Diabetes Program Recommendations Correction (SSI): Please check cbg's and use sensitive correction insulin Novolog scale tidwc. (Can use q 4hrs when NPO)  Note: Thank you, Rosita Kea, RN, CNS, Diabetes Coordinator [phone])

## 2011-12-22 NOTE — Progress Notes (Signed)
Pt seen and examined, admitted this am by Dr.Le Feels weak, but denies any other complaints No further diarrhea H/o Iron deficiency anemia, chronic intermittent diarrhea or constipation Colonoscopy normal in 7/12 by Dr.[name] CT Abd pelvis normal, heme negative black stool in ER Anemia panel pending, Transfuse 1unit PRBC today Will request GI eval for possible EGD/capsule endo Hold BP meds today Was made NPO by admitter for possible endoscopy will DC this.  [facility] [phone]

## 2011-12-22 NOTE — ED Notes (Signed)
Report called to 5E. Patient stable at this time. NAD. Patient taken to floor by Elveria Rising RN.

## 2011-12-22 NOTE — Consult Note (Signed)
Referring Provider: Dr.Joseph-TriadPrimary Care Physician:  [name], MD, MD Primary Gastroenterologist:  Dr.[name]  Reason for Consultation:  [name] deficiency,diarrhea  HPI: Michael [name] is a 71 y.o. male with hx of HTN, Hypothyroidism,AODM, arthritis and hx of colon polyps.He had colonoscopy in 2007,and in 2008 with adenom,atous polyps. Last colon was done in 01/2011 -no recurrent polyps,did have int . Hemorrhoids. Pt 's wife says he has had chronic problems with loose stools for several years-usually post prandial urgency with 3-4 Bm's per day. He has intermittent cramping and discomfort. Appetite good, weight stable. No changes in meds for some time. Wife says diarrhea has been no different in past several weeks. He was told by his primary MD Dr. [name] in March that he was Fe deficient, and was started on oral iron 325 mg daily.  Patient says his stools have been dark since starting iron-no change in diarrhea over the past couple weeks. Patient had complained of dizziness and lightheadedness early today, wife  checked his blood pressure which was 90 systolic and therefore, brought here to the emergency room.  In ER pt documented heme negative.  Labs show hgb 7.5/hct 22.3,mcv 79, lft's wnl. Hgb in feb 2011 was 12.  Past Medical History  Diagnosis Date  . Diabetes mellitus   . Hypertension   . Hyperlipidemia   . Gout   . Arthritis   . Thyroid disease     hypothyroidism  . Personal history of colonic polyps 2007, 2008    adenoma each time, largest 12 mm in 2007    Past Surgical History  Procedure Date  . Rotator cuff repair     left  . Knee arthroscopy     right  . Colonoscopy w/ polypectomy 04/09/2006    12 mm adenoma  . Colonoscopy w/ polypectomy 06/17/2007    5 mm adenoma  . Colonoscopy 02/10/2011    internal hemorrhoids    Prior to Admission medications   Medication Sig Start Date End Date Taking? Authorizing Provider  aspirin 81 MG tablet Take 81 mg by  mouth daily.   Yes Historical Provider, MD  cloNIDine (CATAPRES) 0.1 MG tablet Take 0.1 mg by mouth 2 (two) times daily.   Yes Historical Provider, MD  Ferrous Sulfate (IRON) 325 (65 FE) MG TABS Take 1 tablet by mouth daily.   Yes Historical Provider, MD  glipiZIDE (GLUCOTROL) 10 MG tablet Take 10 mg by mouth daily.     Yes Historical Provider, MD  Javier Docker Oil 500 MG CAPS Take 1 capsule by mouth daily.     Yes Historical Provider, MD  levothyroxine (SYNTHROID, LEVOTHROID) 150 MCG tablet Take 150 mcg by mouth daily.   Yes Historical Provider, MD  lisinopril-hydrochlorothiazide (PRINZIDE,ZESTORETIC) 20-12.5 MG per tablet Take 1 tablet by mouth 2 (two) times daily.     Yes Historical Provider, MD  metFORMIN (GLUCOPHAGE) 1000 MG tablet Take 1,000 mg by mouth 2 (two) times daily with a meal.     Yes Historical Provider, MD  Red Yeast Rice Extract (RED YEAST RICE PO) Take 1 tablet by mouth daily.     Yes Historical Provider, MD    Current Facility-Administered Medications  Medication Dose Route Frequency Provider Last Rate Last Dose  . acetaminophen (TYLENOL) tablet 650 mg  650 mg Oral Once Orvan Falconer, MD   650 mg at 12/22/11 0905  . diphenhydrAMINE (BENADRYL) injection 25 mg  25 mg Intravenous Once Orvan Falconer, MD   25 mg at 12/22/11 0905  . levothyroxine (  SYNTHROID, LEVOTHROID) tablet 150 mcg  150 mcg Oral Daily Orvan Falconer, MD   150 mcg at 12/22/11 0803  . loperamide (IMODIUM) capsule 4 mg  4 mg Oral Once Martie Lee, PA   4 mg at 12/22/11 0008  . ondansetron (ZOFRAN) injection 4 mg  4 mg Intravenous Q8H PRN Martie Lee, PA      . pantoprazole (PROTONIX) EC tablet 40 mg  40 mg Oral Q0600 Orvan Falconer, MD      . sodium chloride 0.9 % bolus 1,000 mL  1,000 mL Intravenous Once Martie Lee, PA   1,000 mL at 12/22/11 0005  . sodium chloride 0.9 % injection 3 mL  3 mL Intravenous Q12H Orvan Falconer, MD   3 mL at 12/22/11 0914  . DISCONTD: dextrose 5 %-0.9 % sodium chloride infusion   Intravenous Continuous [name], MD        Allergies as of 12/21/2011 - Review Complete 12/21/2011  Allergen Reaction Noted  . Percocet (oxycodone-acetaminophen) Nausea And Vomiting 01/27/2011    History reviewed. No pertinent family history.  History   Social History  . Marital Status: Married    Spouse Name: N/A    Number of Children: N/A  . Years of Education: N/A   Occupational History  . Not on file.   Social History Main Topics  . Smoking status: Former Smoker    Quit date: 03/29/2006  . Smokeless tobacco: Not on file  . Alcohol Use: 0.6 oz/week    1 Cans of beer per week     occasional alcohol intake  . Drug Use: No  . Sexually Active: Not on file   Other Topics Concern  . Not on file   Social History Narrative  . No narrative on file    Review of Systems: Pertinent positive and negative review of systems were noted in the above HPI section.  All other review of systems was otherwise negative.  Physical Exam: Vital signs in last 24 hours: Temp:  [98.1 F (36.7 C)-100 F (37.8 C)] 99.6 F (37.6 C) (05/28 1016) Pulse Rate:  [89-114] 96  (05/28 1016) Resp:  [18-20] 20  (05/28 1016) BP: (97-171)/(61-80) 155/65 mmHg (05/28 1016) SpO2:  [92 %-99 %] 92 % (05/28 1016) Weight:  [247 lb 9.2 oz (112.3 kg)] 247 lb 9.2 oz (112.3 kg) (05/28 0419) Last BM Date: 12/21/11 General:   Alert,  Well-developed, well-nourished, pleasant and cooperative in NAD,sleeepy Head:  Normocephalic and atraumatic. Eyes:  Sclera clear, no icterus.   Conjunctiva pink. Ears:  Normal auditory acuity. Nose:  No deformity, discharge,  or lesions. Mouth:  No deformity or lesions.   Neck:  Supple; no masses or thyromegaly. Lungs:  Clear throughout to auscultation.   No wheezes, crackles, or rhonchi. Heart:  Regular rate and rhythm; no murmurs, clicks, rubs,  or gallops. Abdomen:  Soft,nontender, BS active,nonpalp mass or hsm.   Rectal:  Deferred,documented heme negative earlier today  Msk:  Symmetrical without  gross deformities. . Pulses:  Normal pulses noted. Extremities:  Without clubbing or edema. Neurologic:  Alert and  oriented x4;  grossly normal neurologically. Skin:  Intact without significant lesions or rashes.. Psych:  Alert and cooperative. Normal mood and affect.  Intake/Output from previous day:   Intake/Output this shift: Total I/O In: 350 [Blood:350] Out: -   Lab Results:  Basename 12/22/11 0502 12/22/11 0023  WBC 4.0 5.3  HGB 7.5* 8.5*  HCT 22.3* 25.8*  PLT 171 206  BMET  Basename 12/22/11 0500 12/22/11 0023  NA 136 138  K 4.2 4.1  CL 103 103  CO2 22 22  GLUCOSE 186* 141*  BUN 26* 31*  CREATININE 1.07 1.16  CALCIUM 8.5 9.1   LFT  Basename 12/22/11 0023  PROT 7.2  ALBUMIN 3.5  AST 14  ALT 10  ALKPHOS 93  BILITOT 0.2*  BILIDIR --  IBILI --   PT/INR No results found for this basename: LABPROT:2,INR:2 in the last 72 hours      Studies/Results: Ct Abdomen Pelvis Wo Contrast  12/22/2011  *RADIOLOGY REPORT*  Clinical Data: Severe diarrhea and unexplained anemia. Hypotension.  CT ABDOMEN AND PELVIS WITHOUT CONTRAST  Technique:  Multidetector CT imaging of the abdomen and pelvis was performed following the standard protocol without intravenous contrast.  Comparison: None.  Findings: The lung bases are clear.  The unenhanced appearance of the liver, spleen, pancreas, adrenal glands, kidneys, abdominal aorta, and retroperitoneal lymph nodes is unremarkable.  There appear to be a vague low attenuation changes in the kidneys likely representing small cysts.  Cholelithiasis with large stones in the gallbladder neck region.  No gallbladder distension or and edema. The stomach and small bowel are decompressed.  Stool filled colon without distension.  No free air or free fluid in the abdomen. Prominent visceral adipose tissues.  Pelvis:  Calcification of the prostate gland without significant enlargement.  No bladder wall thickening.  Diverticula in the sigmoid colon  without inflammation.  No free or loculated pelvic fluid collections.  The appendix is normal.  No retroperitoneal hematomas.  Normal alignment of the lumbar vertebrae with mild degenerative change.  IMPRESSION: Cholelithiasis.  The no acute process demonstrated in the abdomen pelvis.  No evidence of retroperitoneal or abdominal hematoma.  Original Report Authenticated By: Neale Burly, M.D.    IMPRESSION:  #1  71 yo male with dizziness, and hypotension- ?etiology. On antihypertensives at home. ? Volume depletion but pt and family do not think his diarrhea sxs are any different than baseline. #2 Normocytic anemia- ,pt told Fe deficient 3 months ago, and started oral Fe- will obtain labs from his primary.  Pt is heme negative. R/o malabsorption, r/o myelodysplasia,intermittent GI losses #3 AODM #4 chronic diarrhea- R/O IBS, r/o med induced- glucophage, r/o celiac disease #5 Hypothyroidism #6  hx HTN #7 Hx adenomatous polyps- negative colon 01/2011  PLAN: #1 Pt to be transfused today #2  fe studies, B12/folate #3 Obtain labs from Dr. Chales Abrahams office #4 schedule for EGD with small bowel bx's in am with Dr. Atilano Ina discussed in detail with pt  And he is agreeable to proceed.  If this is unrevealing will consider outpt capsule endoscopy. Amy Esterwood  12/22/2011, 10:24 AM      GI ATTENDING  HISTORY, LABS, X-RAYS, PRIOR ENDOSCOPY REPORTS REVIEWED. PATIENT SEEN AND EXAMINED. WIFE, SON, MINISTER IN ROOM. AGREE WITH H&P AS OUTLINED BY AE,PAC. NEW PROBLEM OF IRON DEFICIENCY ANEMIA SINCE MARCH. PRIOR HX COLONIC ADENOMAS. ALSO, WITH CHRONIC ONGOING DIARRHEA. ADMITTED WITH DIZZINESS, LOW BP, AND ANEMIA. DENIES GI BLEED. STOOL HEME NEGATIVE. EXAM NEGATIVE EXCEPT FOR OBESITY AND PALLOR. AGREE WITH TRANSFUSIONS, ADDITIONAL BLOOD WORK AND ENDOSCOPY TOMORROW.The nature of the procedure, as well as the risks, benefits, and alternatives were carefully and thoroughly reviewed with the patient. Ample  time for discussion and questions allowed. The patient understood, was satisfied, and agreed to proceed. IF EGD NEGATIVE, OUTPATIENT CAPSULE ENDOSCOPY +/- HEMATOLOGY EVALUATION.  Docia Chuck. Geri Seminole., M.D. [facility] Division of  Gastroenterology .

## 2011-12-22 NOTE — Plan of Care (Signed)
Problem: Phase I Progression Outcomes Goal: Other Phase I Outcomes/Goals Outcome: Progressing hgb 7.5 2 units of prbcs given as ordered.  Cbc ordered for the am.

## 2011-12-22 NOTE — Progress Notes (Signed)
Pt wife was asking if the Dr could do a blood test to check for arthritis.  Encouraged her to ask in the am when the Dr rounds.

## 2011-12-22 NOTE — Progress Notes (Signed)
Pt had a 4 beat run of vt Dr Broadus John informed while rounding on pt.

## 2011-12-23 ENCOUNTER — Encounter (HOSPITAL_COMMUNITY): Payer: Self-pay | Admitting: *Deleted

## 2011-12-23 ENCOUNTER — Encounter (HOSPITAL_COMMUNITY)
Admission: EM | Disposition: A | Discharge status: Home / Self Care | Payer: Self-pay | Source: Home / Self Care | Attending: Internal Medicine

## 2011-12-23 DIAGNOSIS — R112 Nausea with vomiting, unspecified: Secondary | ICD-10-CM

## 2011-12-23 DIAGNOSIS — E860 Dehydration: Secondary | ICD-10-CM

## 2011-12-23 DIAGNOSIS — D509 Iron deficiency anemia, unspecified: Secondary | ICD-10-CM | POA: Diagnosis not present

## 2011-12-23 DIAGNOSIS — R197 Diarrhea, unspecified: Secondary | ICD-10-CM

## 2011-12-23 DIAGNOSIS — D649 Anemia, unspecified: Secondary | ICD-10-CM | POA: Diagnosis not present

## 2011-12-23 DIAGNOSIS — I959 Hypotension, unspecified: Secondary | ICD-10-CM

## 2011-12-23 HISTORY — PX: ESOPHAGOGASTRODUODENOSCOPY: SHX5428

## 2011-12-23 LAB — TYPE AND SCREEN
ABO/RH(D): A POS
Unit division: 0

## 2011-12-23 LAB — CBC
MCH: 26.4 pg (ref 26.0–34.0)
MCHC: 32.6 g/dL (ref 30.0–36.0)
Platelets: 154 10*3/uL (ref 150–400)

## 2011-12-23 LAB — BASIC METABOLIC PANEL
Calcium: 8.8 mg/dL (ref 8.4–10.5)
Creatinine, Ser: 1.14 mg/dL (ref 0.50–1.35)
GFR calc non Af Amer: 64 mL/min — ABNORMAL LOW (ref >90)
Sodium: 139 mEq/L (ref 135–145)

## 2011-12-23 SURGERY — EGD (ESOPHAGOGASTRODUODENOSCOPY)
Anesthesia: Moderate Sedation

## 2011-12-23 MED ORDER — BUTAMBEN-TETRACAINE-BENZOCAINE 2-2-14 % EX AERO
INHALATION_SPRAY | CUTANEOUS | Status: DC | PRN
  Administered 2011-12-23: 2 via TOPICAL

## 2011-12-23 MED ORDER — SODIUM CHLORIDE 0.9 % IV SOLN: INTRAVENOUS | Status: DC

## 2011-12-23 MED ORDER — IRON 325 (65 FE) MG PO TABS: 1.0000 | ORAL_TABLET | Freq: Every day | ORAL | Status: DC

## 2011-12-23 MED ORDER — FENTANYL NICU IV SYRINGE 50 MCG/ML
INJECTION | INTRAMUSCULAR | Status: DC | PRN
  Administered 2011-12-23 (×3): 25 ug via INTRAVENOUS

## 2011-12-23 MED ORDER — MIDAZOLAM HCL 10 MG/2ML IJ SOLN
INTRAMUSCULAR | Status: DC | PRN
  Administered 2011-12-23: 2 mg via INTRAVENOUS
  Administered 2011-12-23: 1 mg via INTRAVENOUS
  Administered 2011-12-23 (×2): 2 mg via INTRAVENOUS

## 2011-12-23 NOTE — Progress Notes (Signed)
Patient ID: Michael [name], male   DOB: 1941-02-20, 71 y.o.   MRN: [id number] [facility] Progress Note  Subjective: Feels better in general than on admit-fatigued. No c/o dizziness,lightheadedness etc. No further diarrhea so stool for c. Diff still not collected.  Labs from Dr. Velora [name]' office received- no fe studies done, hgb was 9.5 in March 2013  Objective:  Vital signs in last 24 hours: Temp:  [98.6 F (37 C)-100 F (37.8 C)] 98.7 F (37.1 C) (05/29 0600) Pulse Rate:  [88-99] 90  (05/29 0600) Resp:  [16-20] 18  (05/29 0600) BP: (94-174)/(57-81) 156/65 mmHg (05/29 0600) SpO2:  [92 %-97 %] 97 % (05/29 0600) Weight:  [241 lb 2.9 oz (109.4 kg)] 241 lb 2.9 oz (109.4 kg) (05/29 0500) Last BM Date: 12/21/11 General:   Alert,  Well-developed,    in NAD Heart:  Regular rate and rhythm; no murmurs Pulm;clear Abdomen:  Soft, nontender and nondistended. Normal bowel sounds, without guarding Extremities:  Without edema. Neurologic:  Alert and  oriented x4;  grossly normal neurologically. Psych:  Alert and cooperative. Normal mood and affect.  Intake/Output from previous day: 05/28 0701 - 05/29 0700 In: 1300 [P.O.:600; Blood:700] Out: -  Intake/Output this shift:    Lab Results:  Basename 12/23/11 0453 12/22/11 0502 12/22/11 0023  WBC 3.5* 4.0 5.3  HGB 9.2* 7.5* 8.5*  HCT 28.2* 22.3* 25.8*  PLT 154 171 206   BMET  Basename 12/23/11 0453 12/22/11 0500 12/22/11 0023  NA 139 136 138  K 4.8 4.2 4.1  CL 104 103 103  CO2 25 22 22   GLUCOSE 202* 186* 141*  BUN 20 26* 31*  CREATININE 1.14 1.07 1.16  CALCIUM 8.8 8.5 9.1   Ferritin 557, fe= 21, tibc=265, fe sat=8    Assessment / Plan: #1 71 yo male with hypotension-resolved / volume related, he was on  Lisinopril, and clonidine at home-both on hold here. #2 Fe  deficiency anemia,progressive, currently heme negative, on oral fe since 3 /2013 For EGD with small bowel bx's this am, then should be Ok from Michael [name] standpoint  for discharge- if bx's negative, we can arrange for outpatient capsule endoscopy with Dr. Carlean [name]. Would consider giving IV  Fe replacement while here. #3 chronic diarrhea- likely IBS vs med induced- would d/c Glucophage and assess response , doubt infectious, no diarrhea since admit. Principal Problem:  *Hypotension Active Problems:  Anemia  Diarrhea  DM (diabetes mellitus)  Thyroid disease  Iron deficiency anemia, unspecified     LOS: 2 days   Amy Esterwood  12/23/2011, 8:50 AM    GI ATTENDING  EGD UNREMARKABLE. DUODENAL BX TAKEN. WE WILL ARRANGE OUT PATIENT CAPSULE ENDOSCOPY AND FOLLOW UP WITH DR Michael [name] ([facility] PRIMARY GI). OK FOR D/C HOME.  Docia Chuck. Geri Seminole., M.D. [facility] Division of Gastroenterology

## 2011-12-23 NOTE — Discharge Summary (Signed)
Physician Discharge Summary  Michael [name] [id number] DOB: 07-Jan-1941 DOA: 12/21/2011  PCP: [name], MD, MD  Admit date: 12/21/2011 Discharge date: 12/23/2011  Discharge Diagnoses:  Principal Problem:  *Hypotension Active Problems:  Anemia  Diarrhea  DM (diabetes mellitus)  Thyroid disease  Iron deficiency anemia, unspecified   Discharge Condition: Stable  Disposition: Home with follow up with GI and PCP  History of present illness:  From original HPI:   Michael [name] is an 71 y.o. male with history of hypertension on several medications including diuretic, history of diabetes, hyperlipidemia, hypothyroidism on supplement, colonic polyp, iron deficiency anemia on supplement, present to the emergency room as his wife check his blood pressure and found it to be in the 0000000 systolic. He also experienced lightheadedness but no frank syncope. Evaluation in emergency room did confirm hypotension, but responded promptly to IV fluid. He was found to have a hemoglobin of 8.5 g per decaliter, with MCV of 79, and black stool but guaiac negative. He has no abdominal pain, nausea, vomiting, but admitted to having severe diarrhea for several days. He was also found to have slight elevation of BUN to 31, normal creatinine. He has normal platelet count and normal white count as well. There was no recent CBC to compare. He stated that he had colonoscopy about a year or so ago and no polyps or cancer was found. Hospitalist was asked to admit him for volume depletion, anemia, and hypotension.   Hospital Course:  While in house patient's blood pressure medication was held.  He was also transfused two units of blood.  EGD was performed which did not show any abnormalities although there were several duodenal biopsies obtained.  After examination by GI patient was indicated to follow up with them for further testing and evaluation (capsule endoscopy).  Currently patient has follow up exam with GI.    Patient has no active bleeding and his hypotension resolved with IVF rehydration.  Will plan on discharging off of hypertensive medication and aspirin.  Will have patient follow up with primary care physician for further recommendations.  Discharge Exam: Filed Vitals:   12/23/11 1502  BP: 149/59  Pulse: 82  Temp: 97.9 F (36.6 C)  Resp: 14   Filed Vitals:   12/23/11 1020 12/23/11 1030 12/23/11 1031 12/23/11 1502  BP: 132/66 130/68 130/68 149/59  Pulse:    82  Temp:    97.9 F (36.6 C)  TempSrc:    Oral  Resp: 11 14 14 14   Height:      Weight:      SpO2: 96% 96% 96% 96%   General: Alert, awake, oriented x3, in no acute distress. HEENT: No bruits, no goiter. Heart: Regular rate and rhythm, without murmurs, rubs, gallops. Lungs: Clear to auscultation bilaterally. Abdomen: Soft, nontender, nondistended, positive bowel sounds. Extremities: No clubbing cyanosis or edema with positive pedal pulses. Neuro: Grossly intact, nonfocal.   Discharge Instructions  Discharge Orders    Future Appointments: Provider: Department: Dept Phone: Center:   12/25/2011 2:00 PM [facility] [id number] [facility]   12/28/2011 8:00 AM [facility] [id number] [facility]     Future Orders Please Complete By Expires   Diet - low sodium heart healthy      Increase activity slowly      Discharge instructions      Comments:   Please follow up with Gastroenterology as indicated per your conversation recently with the hospital gastroenterologist.  Also  take your Iron supplement to 3 times per day.   Call MD for:  temperature >100.4      Call MD for:  persistant dizziness or light-headedness        Medication List  As of 12/23/2011  4:00 PM   STOP taking these medications         aspirin 81 MG tablet      cloNIDine 0.1 MG tablet      lisinopril-hydrochlorothiazide 20-12.5 MG per tablet      RED YEAST RICE PO         TAKE  these medications         glipiZIDE 10 MG tablet   Commonly known as: GLUCOTROL   Take 10 mg by mouth daily.      Iron 325 (65 FE) MG Tabs   Take 1 tablet by mouth daily. Take 1 tablet by mouth three times daily      Krill Oil 500 MG Caps   Take 1 capsule by mouth daily.      levothyroxine 150 MCG tablet   Commonly known as: SYNTHROID, LEVOTHROID   Take 150 mcg by mouth daily.      metFORMIN 1000 MG tablet   Commonly known as: GLUCOPHAGE   Take 1,000 mg by mouth 2 (two) times daily with a meal.              The results of significant diagnostics from this hospitalization (including imaging, microbiology, ancillary and laboratory) are listed below for reference.    Significant Diagnostic Studies: Ct Abdomen Pelvis Wo Contrast  12/22/2011  *RADIOLOGY REPORT*  Clinical Data: Severe diarrhea and unexplained anemia. Hypotension.  CT ABDOMEN AND PELVIS WITHOUT CONTRAST  Technique:  Multidetector CT imaging of the abdomen and pelvis was performed following the standard protocol without intravenous contrast.  Comparison: None.  Findings: The lung bases are clear.  The unenhanced appearance of the liver, spleen, pancreas, adrenal glands, kidneys, abdominal aorta, and retroperitoneal lymph nodes is unremarkable.  There appear to be a vague low attenuation changes in the kidneys likely representing small cysts.  Cholelithiasis with large stones in the gallbladder neck region.  No gallbladder distension or and edema. The stomach and small bowel are decompressed.  Stool filled colon without distension.  No free air or free fluid in the abdomen. Prominent visceral adipose tissues.  Pelvis:  Calcification of the prostate gland without significant enlargement.  No bladder wall thickening.  Diverticula in the sigmoid colon without inflammation.  No free or loculated pelvic fluid collections.  The appendix is normal.  No retroperitoneal hematomas.  Normal alignment of the lumbar vertebrae with mild  degenerative change.  IMPRESSION: Cholelithiasis.  The no acute process demonstrated in the abdomen pelvis.  No evidence of retroperitoneal or abdominal hematoma.  Original Report Authenticated By: Neale Burly, M.D.    Microbiology: No results found for this or any previous visit (from the past 240 hour(s)).   Labs: Basic Metabolic Panel:  Lab [id number] 12/22/11 0500 12/22/11 0023  NA 139 136 138  K 4.8 4.2 --  CL 104 103 103  CO2 25 22 22   GLUCOSE 202* 186* 141*  BUN 20 26* 31*  CREATININE 1.14 1.07 1.16  CALCIUM 8.8 8.5 9.1  MG -- 1.7 --  PHOS -- -- --   Liver Function Tests:  Lab 12/22/11 0023  AST 14  ALT 10  ALKPHOS 93  BILITOT 0.2*  PROT 7.2  ALBUMIN 3.5   No results found  for this basename: LIPASE:5,AMYLASE:5 in the last 168 hours No results found for this basename: AMMONIA:5 in the last 168 hours CBC:  Lab 12/23/11 0453 12/22/11 0502 12/22/11 0023  WBC 3.5* 4.0 5.3  NEUTROABS -- -- 3.9  HGB 9.2* 7.5* 8.5*  HCT 28.2* 22.3* 25.8*  MCV 81.0 79.4 79.6  PLT 154 171 206   Cardiac Enzymes: No results found for this basename: CKTOTAL:5,CKMB:5,CKMBINDEX:5,TROPONINI:5 in the last 168 hours BNP: No components found with this basename: POCBNP:5 CBG: No results found for this basename: GLUCAP:5 in the last 168 hours  Time coordinating discharge: 35 minutes placing orders, examining patient, documenting, physical exam  Signed:  [place], [facility] 12/23/2011, 4:00 PM

## 2011-12-23 NOTE — Op Note (Signed)
[facility] [place], [place]  [id number]  ENDOSCOPY PROCEDURE REPORT  [name]:  Michael [name], Michael [name]  MR#:  [id number] BIRTHDATE:  07/13/1941, 71 yrs. old  GENDER:  male  ENDOSCOPIST:  Docia Chuck. Geri Seminole, MD Referred by:  [facility],  PROCEDURE DATE:  12/23/2011 PROCEDURE:  EGD with biopsy, [id number] ASA CLASS:  Class II INDICATIONS:  iron deficiency anemia  MEDICATIONS:   Fentanyl 75 mcg IV, Versed 7 mg IV TOPICAL ANESTHETIC:  Cetacaine Spray  DESCRIPTION OF PROCEDURE:   After Michael risks benefits and alternatives of Michael procedure were thoroughly explained, informed consent was obtained.  Michael Pentax Gastroscope [id number] endoscope was introduced through Michael mouth and advanced to Michael third portion of Michael duodenum, without limitations.  Michael instrument was slowly withdrawn as Michael mucosa was fully examined. <<PROCEDUREIMAGES>>  Michael upper, middle, and distal third of Michael esophagus were carefully inspected and no abnormalities were noted. Michael z-line was well seen at Michael GEJ. Michael endoscope was pushed into Michael fundus which was normal including a retroflexed view. Michael antrum,gastric body, first, second, and third part of Michael duodenum were unremarkable. Multiple duoenal bx taken from each portion. Retroflexed views revealed no abnormalities.    Michael scope was then withdrawn from Michael [name] and Michael procedure completed.  COMPLICATIONS:  None  ENDOSCOPIC IMPRESSION: 1) Normal EGD to D3  RECOMMENDATIONS: 1) Await duodenal biopsy results 2) Capsule endoscopy as out [name] 3) GI follow-up with Dr [name] thereafter  [place]  ______________________________ Docia Chuck. Geri Seminole, MD  CC:  Bernerd Limbo, MD;  Silvano Rusk, MD;  Michael [name]  n. eSIGNED:   Docia Chuck. Geri Seminole at 12/23/2011 10:04 AM  Alba Cory, [id number]

## 2011-12-23 NOTE — Progress Notes (Signed)
Inpatient Diabetes Program Recommendations  AACE/ADA: New Consensus Statement on Inpatient Glycemic Control (2009)  Target Ranges:  Prepandial:   less than 140 mg/dL      Peak postprandial:   less than 180 mg/dL (1-2 hours)      Critically ill patients:  140 - 180 mg/dL   Reason for Visit: Pt has diabetes on Glipizide at home. Added carbohydrate modifed to diet orders.  Lab glucose at 200 mg/dL this am.  Please see request below.  Inpatient Diabetes Program Recommendations Correction (SSI): Please check cbg's and use sensitive correction insulin Novolog scale tidwc.  Note: Thank you, Rosita Kea, RN, CNS, Diabetes Coordinator [phone])

## 2011-12-24 ENCOUNTER — Encounter: Payer: Self-pay | Admitting: Internal Medicine

## 2011-12-24 ENCOUNTER — Encounter (HOSPITAL_COMMUNITY): Payer: Self-pay | Admitting: Internal Medicine

## 2011-12-24 NOTE — ED Provider Notes (Signed)
Medical screening examination/treatment/procedure(s) were performed by non-physician practitioner and as supervising physician I was immediately available for consultation/collaboration.   Trisha Mangle, MD 12/24/11 [phone]

## 2011-12-28 ENCOUNTER — Ambulatory Visit (INDEPENDENT_AMBULATORY_CARE_PROVIDER_SITE_OTHER): Payer: Medicare Other | Admitting: Internal Medicine

## 2011-12-28 DIAGNOSIS — D500 Iron deficiency anemia secondary to blood loss (chronic): Secondary | ICD-10-CM

## 2011-12-28 HISTORY — PX: GIVENS CAPSULE STUDY: SHX5432

## 2011-12-28 NOTE — Progress Notes (Signed)
Pt here at 0800am and reports he did the prep as ordered and had had several BMs. Leads attached to pt and then connected to computer module. Pt swallowed capsule without difficulty and lay on his side x 30 minutes w/o any complaints or signs of distress. Pt sent home with instructions. Capsule  LOT 2012- 45/20143S     25 Pt back at 4pm, leads removed, pt denied any problems and was discharged home.

## 2012-01-06 ENCOUNTER — Encounter: Payer: Self-pay | Admitting: Physician Assistant

## 2012-01-06 ENCOUNTER — Other Ambulatory Visit (INDEPENDENT_AMBULATORY_CARE_PROVIDER_SITE_OTHER): Payer: Medicare Other

## 2012-01-06 ENCOUNTER — Telehealth: Payer: Self-pay | Admitting: Internal Medicine

## 2012-01-06 ENCOUNTER — Telehealth: Payer: Self-pay

## 2012-01-06 DIAGNOSIS — D500 Iron deficiency anemia secondary to blood loss (chronic): Secondary | ICD-10-CM | POA: Diagnosis not present

## 2012-01-06 DIAGNOSIS — E119 Type 2 diabetes mellitus without complications: Secondary | ICD-10-CM | POA: Diagnosis not present

## 2012-01-06 LAB — CBC WITH DIFFERENTIAL/PLATELET
Eosinophils Relative: 1.2 % (ref 0.0–5.0)
HCT: 29.5 % — ABNORMAL LOW (ref 39.0–52.0)
Hemoglobin: 9.9 g/dL — ABNORMAL LOW (ref 13.0–17.0)
Lymphocytes Relative: 11.4 % — ABNORMAL LOW (ref 12.0–46.0)
Lymphs Abs: 0.7 10*3/uL (ref 0.7–4.0)
Monocytes Relative: 5.3 % (ref 3.0–12.0)
Platelets: 246 10*3/uL (ref 150.0–400.0)
WBC: 5.8 10*3/uL (ref 4.5–10.5)

## 2012-01-06 NOTE — Telephone Encounter (Signed)
Patient advised he will come for labs one day this week and see Dr [name] on 01/19/12

## 2012-01-06 NOTE — Telephone Encounter (Signed)
Message copied by Marlon Pel on Wed Jan 06, 2012  8:09 AM ------      Message from: Gatha Mayer      Created: Tue Jan 05, 2012  4:17 PM      Regarding: results and follow-up       Capsule endoscopy shows some AVM's - can be leaking blood from these            I need to discuss with him in office, and he also needs a CBC done before that visit to follow-up anemia            ? If he could see me this week - would be good but not essential

## 2012-01-06 NOTE — Telephone Encounter (Signed)
All questions answered.  He came for labs this am.

## 2012-01-11 DIAGNOSIS — E119 Type 2 diabetes mellitus without complications: Secondary | ICD-10-CM | POA: Diagnosis not present

## 2012-01-11 DIAGNOSIS — M130 Polyarthritis, unspecified: Secondary | ICD-10-CM | POA: Diagnosis not present

## 2012-01-11 DIAGNOSIS — Z87891 Personal history of nicotine dependence: Secondary | ICD-10-CM | POA: Diagnosis not present

## 2012-01-11 DIAGNOSIS — M109 Gout, unspecified: Secondary | ICD-10-CM | POA: Diagnosis not present

## 2012-01-19 ENCOUNTER — Encounter: Payer: Self-pay | Admitting: Internal Medicine

## 2012-01-19 ENCOUNTER — Ambulatory Visit (INDEPENDENT_AMBULATORY_CARE_PROVIDER_SITE_OTHER): Payer: Medicare Other | Admitting: Internal Medicine

## 2012-01-19 VITALS — BP 136/60 | HR 88 | Ht 71.0 in | Wt 246.0 lb

## 2012-01-19 DIAGNOSIS — D500 Iron deficiency anemia secondary to blood loss (chronic): Secondary | ICD-10-CM | POA: Diagnosis not present

## 2012-01-19 DIAGNOSIS — K5520 Angiodysplasia of colon without hemorrhage: Secondary | ICD-10-CM | POA: Diagnosis not present

## 2012-01-19 NOTE — Patient Instructions (Addendum)
You have AVM's (angiodysloasia is another name) of the small intestine that are leaking blood. Iron therapy can treat this but you may need to have the AVM's cauterized. Let's recheck your hemoglobin on July 8 - come to the lab in this office.  The heart rhythm problem you had in the hospital was transient non-sustained ventricular tachycardia. I think it was related to your anemia stressing your heart but check with Dr. Coletta Memos about this to see if he thinks anything else should be done.  Remember to not overdo it, rise slowly and move slower than usual until we can get the blood counts higher.   We will call you in July after the blood test.  Copy to Dr. Coletta Memos also  Gatha Mayer, MD, [facility]

## 2012-01-20 DIAGNOSIS — D500 Iron deficiency anemia secondary to blood loss (chronic): Secondary | ICD-10-CM | POA: Insufficient documentation

## 2012-01-20 DIAGNOSIS — K5521 Angiodysplasia of colon with hemorrhage: Secondary | ICD-10-CM | POA: Insufficient documentation

## 2012-01-20 HISTORY — DX: Angiodysplasia of colon with hemorrhage: K55.21

## 2012-01-20 NOTE — Progress Notes (Signed)
Patient ID: Michael [name], male   DOB: 1940/10/25, 71 y.o.   MRN: [id number]  The patient is here with his wife to discuss the results of his recent small bowel capsule endoscopy after upper endoscopy and colonoscopy did not reveal the cause of heme positive iron deficiency anemia. He had been hospitalized for that problem due to weakness and profound anemia. The capsule endoscopy showed angiodysplasia or AVMs in what is thought to be the proximal small bowel. He is currently on iron therapy with a small rise in hemoglobin over the last couple weeks, last hemoglobin being in mid-June. He has some dark stools on iron but does not think he bleeding. He has some postural hypotension symptoms when he rises quickly, he will become dizzy and he feels somewhat weak. He gets short of breath at times. He is not having chest pain.    Medications, allergies, past medical history, past surgical history, family history and social history are reviewed and updated in the EMR.   Physical exam is limited to general appearance is that of an obese somewhat chronically ill man, and vital signs which are listed.  Lab Results  Component Value Date   WBC 5.8 01/06/2012   HGB 9.9* 01/06/2012   HCT 29.5* 01/06/2012   MCV 79.9 01/06/2012   PLT 246.0 01/06/2012    Assessment and plan:  1. Iron deficiency anemia secondary to blood loss (chronic)   2. Angiodysplasia of intestinal tract    I am not entirely clear that I could reach the AVMs with a push enteroscopy. It is possible based upon the time they were seen. He has not any worse, he will go ahead and continue on iron and will recheck his hemoglobin several weeks. Depending upon what that shows a male he is doing we will continue with iron therapy orally, consider IV iron which we discuss today or possibly a push enteroscopy. There it should always be the option of a double balloon enteroscopy at one of the tertiary centers should it come to that but fortunately he has  not any worse on iron therapy at this point and hopefully he will improve with that.  He had some transient ventricular tachycardia when he was hospitalized with anemia. It was nonsustained. I suspect that nothing further needs to be done with this but I advised the patient and his wife to check with her primary care physician about this. He is also advised to move slowly in place himself well he is still anemic, and to rise slowly as he may suffer from some postural hypotension-like symptoms until his hemoglobin improves more.  I appreciate the opportunity to care for this patient.   CC: [name], MD

## 2012-02-01 ENCOUNTER — Other Ambulatory Visit (INDEPENDENT_AMBULATORY_CARE_PROVIDER_SITE_OTHER): Payer: Medicare Other

## 2012-02-01 DIAGNOSIS — D500 Iron deficiency anemia secondary to blood loss (chronic): Secondary | ICD-10-CM

## 2012-02-01 LAB — CBC WITH DIFFERENTIAL/PLATELET
Basophils Relative: 0.6 % (ref 0.0–3.0)
Eosinophils Relative: 2 % (ref 0.0–5.0)
HCT: 29.7 % — ABNORMAL LOW (ref 39.0–52.0)
Hemoglobin: 10.2 g/dL — ABNORMAL LOW (ref 13.0–17.0)
Lymphs Abs: 0.6 10*3/uL — ABNORMAL LOW (ref 0.7–4.0)
MCV: 80.4 fl (ref 78.0–100.0)
Monocytes Absolute: 0.3 10*3/uL (ref 0.1–1.0)
Neutro Abs: 3.7 10*3/uL (ref 1.4–7.7)
Platelets: 229 10*3/uL (ref 150.0–400.0)
WBC: 4.6 10*3/uL (ref 4.5–10.5)

## 2012-02-02 ENCOUNTER — Other Ambulatory Visit: Payer: Self-pay

## 2012-02-02 DIAGNOSIS — D649 Anemia, unspecified: Secondary | ICD-10-CM

## 2012-02-02 NOTE — Progress Notes (Signed)
Quick Note:  Hgb is climbing - a little I want him to have a CBC again in 2 weeks - use anemia dx on problem list  Remind him to let us know if he has any dark black stools (tar-like, not coal colored - from iron) or red blood per rectum ______

## 2012-02-19 ENCOUNTER — Other Ambulatory Visit (INDEPENDENT_AMBULATORY_CARE_PROVIDER_SITE_OTHER): Payer: Medicare Other

## 2012-02-19 DIAGNOSIS — D649 Anemia, unspecified: Secondary | ICD-10-CM

## 2012-02-19 LAB — CBC WITH DIFFERENTIAL/PLATELET
Basophils Absolute: 0 10*3/uL (ref 0.0–0.1)
Eosinophils Absolute: 0.1 10*3/uL (ref 0.0–0.7)
Lymphs Abs: 0.7 10*3/uL (ref 0.7–4.0)
MCHC: 34.2 g/dL (ref 30.0–36.0)
MCV: 81.5 fl (ref 78.0–100.0)
Monocytes Absolute: 0.3 10*3/uL (ref 0.1–1.0)
Neutrophils Relative %: 69.5 % (ref 43.0–77.0)
Platelets: 212 10*3/uL (ref 150.0–400.0)
RDW: 19.1 % — ABNORMAL HIGH (ref 11.5–14.6)
WBC: 3.6 10*3/uL — ABNORMAL LOW (ref 4.5–10.5)

## 2012-02-23 ENCOUNTER — Telehealth: Payer: Self-pay | Admitting: Internal Medicine

## 2012-02-23 NOTE — Telephone Encounter (Signed)
Dr. [name] I reviewed with the patient that labs are stable from last week please advise next step and future lab plans?

## 2012-02-23 NOTE — Progress Notes (Signed)
Quick Note:  Sorry for delay  Hgb stable - review of older labs shows that iron levels were ok  He may actually have an anemia of chronic disease and iron deficiency  Let's give iron another 6 weeks and check CBC then  If still a problem may need to see hematologist (blood specialist) if Dr. Coletta Memos agrees  I will copy dr. Coletta Memos ______

## 2012-02-23 NOTE — Telephone Encounter (Signed)
Left message for patient to call back  

## 2012-02-24 ENCOUNTER — Other Ambulatory Visit: Payer: Self-pay

## 2012-02-24 DIAGNOSIS — D500 Iron deficiency anemia secondary to blood loss (chronic): Secondary | ICD-10-CM

## 2012-03-02 DIAGNOSIS — E119 Type 2 diabetes mellitus without complications: Secondary | ICD-10-CM | POA: Diagnosis not present

## 2012-03-02 DIAGNOSIS — H2510 Age-related nuclear cataract, unspecified eye: Secondary | ICD-10-CM | POA: Diagnosis not present

## 2012-03-07 DIAGNOSIS — M25549 Pain in joints of unspecified hand: Secondary | ICD-10-CM | POA: Diagnosis not present

## 2012-03-07 DIAGNOSIS — Z87891 Personal history of nicotine dependence: Secondary | ICD-10-CM | POA: Diagnosis not present

## 2012-03-07 DIAGNOSIS — D649 Anemia, unspecified: Secondary | ICD-10-CM | POA: Diagnosis not present

## 2012-03-07 DIAGNOSIS — I10 Essential (primary) hypertension: Secondary | ICD-10-CM | POA: Diagnosis not present

## 2012-03-07 DIAGNOSIS — G90519 Complex regional pain syndrome I of unspecified upper limb: Secondary | ICD-10-CM | POA: Diagnosis not present

## 2012-03-16 DIAGNOSIS — M130 Polyarthritis, unspecified: Secondary | ICD-10-CM | POA: Diagnosis not present

## 2012-03-16 DIAGNOSIS — M25549 Pain in joints of unspecified hand: Secondary | ICD-10-CM | POA: Diagnosis not present

## 2012-03-16 DIAGNOSIS — M109 Gout, unspecified: Secondary | ICD-10-CM | POA: Diagnosis not present

## 2012-03-16 DIAGNOSIS — L408 Other psoriasis: Secondary | ICD-10-CM | POA: Diagnosis not present

## 2012-03-23 DIAGNOSIS — M109 Gout, unspecified: Secondary | ICD-10-CM | POA: Diagnosis not present

## 2012-03-23 DIAGNOSIS — M130 Polyarthritis, unspecified: Secondary | ICD-10-CM | POA: Diagnosis not present

## 2012-03-23 DIAGNOSIS — L408 Other psoriasis: Secondary | ICD-10-CM | POA: Diagnosis not present

## 2012-04-06 ENCOUNTER — Other Ambulatory Visit (INDEPENDENT_AMBULATORY_CARE_PROVIDER_SITE_OTHER): Payer: Medicare Other

## 2012-04-06 DIAGNOSIS — D500 Iron deficiency anemia secondary to blood loss (chronic): Secondary | ICD-10-CM

## 2012-04-06 LAB — CBC WITH DIFFERENTIAL/PLATELET
Basophils Relative: 0.4 % (ref 0.0–3.0)
Eosinophils Absolute: 0.1 10*3/uL (ref 0.0–0.7)
Eosinophils Relative: 1.7 % (ref 0.0–5.0)
Lymphocytes Relative: 15.3 % (ref 12.0–46.0)
Neutrophils Relative %: 75.7 % (ref 43.0–77.0)
Platelets: 212 10*3/uL (ref 150.0–400.0)
RBC: 3.93 Mil/uL — ABNORMAL LOW (ref 4.22–5.81)
WBC: 4.9 10*3/uL (ref 4.5–10.5)

## 2012-04-06 NOTE — Progress Notes (Signed)
Quick Note:  Hgb about the same Not really responding to iron  I recommend he follow-up with dr. Coletta Memos to see if hematologye evaluation appropriate - it may be but would defer to Dr. Coletta Memos  Please copy Dr. Coletta Memos on this note and lab result ______

## 2012-04-14 DIAGNOSIS — M109 Gout, unspecified: Secondary | ICD-10-CM | POA: Diagnosis not present

## 2012-04-14 DIAGNOSIS — L408 Other psoriasis: Secondary | ICD-10-CM | POA: Diagnosis not present

## 2012-04-14 DIAGNOSIS — M130 Polyarthritis, unspecified: Secondary | ICD-10-CM | POA: Diagnosis not present

## 2012-04-14 DIAGNOSIS — M25549 Pain in joints of unspecified hand: Secondary | ICD-10-CM | POA: Diagnosis not present

## 2012-05-09 DIAGNOSIS — Z23 Encounter for immunization: Secondary | ICD-10-CM | POA: Diagnosis not present

## 2012-05-09 DIAGNOSIS — Z6835 Body mass index (BMI) 35.0-35.9, adult: Secondary | ICD-10-CM | POA: Diagnosis not present

## 2012-05-09 DIAGNOSIS — D649 Anemia, unspecified: Secondary | ICD-10-CM | POA: Diagnosis not present

## 2012-05-09 DIAGNOSIS — Z87891 Personal history of nicotine dependence: Secondary | ICD-10-CM | POA: Diagnosis not present

## 2012-05-09 DIAGNOSIS — E119 Type 2 diabetes mellitus without complications: Secondary | ICD-10-CM | POA: Diagnosis not present

## 2012-05-25 DIAGNOSIS — D529 Folate deficiency anemia, unspecified: Secondary | ICD-10-CM | POA: Diagnosis not present

## 2012-05-25 DIAGNOSIS — Z79899 Other long term (current) drug therapy: Secondary | ICD-10-CM | POA: Diagnosis not present

## 2012-05-25 DIAGNOSIS — M25549 Pain in joints of unspecified hand: Secondary | ICD-10-CM | POA: Diagnosis not present

## 2012-05-25 DIAGNOSIS — L4050 Arthropathic psoriasis, unspecified: Secondary | ICD-10-CM | POA: Diagnosis not present

## 2012-07-11 DIAGNOSIS — Z79899 Other long term (current) drug therapy: Secondary | ICD-10-CM | POA: Diagnosis not present

## 2012-07-11 DIAGNOSIS — D529 Folate deficiency anemia, unspecified: Secondary | ICD-10-CM | POA: Diagnosis not present

## 2012-07-11 DIAGNOSIS — L4050 Arthropathic psoriasis, unspecified: Secondary | ICD-10-CM | POA: Diagnosis not present

## 2012-08-03 DIAGNOSIS — L408 Other psoriasis: Secondary | ICD-10-CM | POA: Diagnosis not present

## 2012-08-03 DIAGNOSIS — Z79899 Other long term (current) drug therapy: Secondary | ICD-10-CM | POA: Diagnosis not present

## 2012-08-09 DIAGNOSIS — H539 Unspecified visual disturbance: Secondary | ICD-10-CM | POA: Diagnosis not present

## 2012-08-09 DIAGNOSIS — I10 Essential (primary) hypertension: Secondary | ICD-10-CM | POA: Diagnosis not present

## 2012-08-09 DIAGNOSIS — D649 Anemia, unspecified: Secondary | ICD-10-CM | POA: Diagnosis not present

## 2012-08-09 DIAGNOSIS — E119 Type 2 diabetes mellitus without complications: Secondary | ICD-10-CM | POA: Diagnosis not present

## 2012-08-10 DIAGNOSIS — H348192 Central retinal vein occlusion, unspecified eye, stable: Secondary | ICD-10-CM | POA: Diagnosis not present

## 2012-08-10 DIAGNOSIS — H43819 Vitreous degeneration, unspecified eye: Secondary | ICD-10-CM | POA: Diagnosis not present

## 2012-08-10 DIAGNOSIS — H3581 Retinal edema: Secondary | ICD-10-CM | POA: Diagnosis not present

## 2012-08-10 DIAGNOSIS — E119 Type 2 diabetes mellitus without complications: Secondary | ICD-10-CM | POA: Diagnosis not present

## 2012-08-16 ENCOUNTER — Telehealth: Payer: Self-pay | Admitting: Internal Medicine

## 2012-08-16 NOTE — Telephone Encounter (Signed)
1 week history of abdominal burning and pain.  He has a history of iron def anemia and pain.  His wife reports that the pain is unimproved after 1 week.  He will come in and see Alonza Bogus, PA tomorrow at 10:00

## 2012-08-17 ENCOUNTER — Ambulatory Visit (INDEPENDENT_AMBULATORY_CARE_PROVIDER_SITE_OTHER): Payer: Medicare Other | Admitting: Physician Assistant

## 2012-08-17 ENCOUNTER — Other Ambulatory Visit (INDEPENDENT_AMBULATORY_CARE_PROVIDER_SITE_OTHER): Payer: Medicare Other

## 2012-08-17 ENCOUNTER — Encounter: Payer: Self-pay | Admitting: Physician Assistant

## 2012-08-17 VITALS — BP 140/72 | HR 88 | Ht 71.0 in | Wt 251.2 lb

## 2012-08-17 DIAGNOSIS — R1084 Generalized abdominal pain: Secondary | ICD-10-CM

## 2012-08-17 DIAGNOSIS — K219 Gastro-esophageal reflux disease without esophagitis: Secondary | ICD-10-CM | POA: Diagnosis not present

## 2012-08-17 DIAGNOSIS — M1990 Unspecified osteoarthritis, unspecified site: Secondary | ICD-10-CM

## 2012-08-17 DIAGNOSIS — M129 Arthropathy, unspecified: Secondary | ICD-10-CM | POA: Diagnosis not present

## 2012-08-17 DIAGNOSIS — R12 Heartburn: Secondary | ICD-10-CM | POA: Diagnosis not present

## 2012-08-17 LAB — COMPREHENSIVE METABOLIC PANEL
ALT: 19 U/L (ref 0–53)
AST: 23 U/L (ref 0–37)
Albumin: 4.3 g/dL (ref 3.5–5.2)
Calcium: 10 mg/dL (ref 8.4–10.5)
Chloride: 102 mEq/L (ref 96–112)
Potassium: 4.6 mEq/L (ref 3.5–5.1)
Sodium: 136 mEq/L (ref 135–145)
Total Protein: 7.9 g/dL (ref 6.0–8.3)

## 2012-08-17 LAB — CBC WITH DIFFERENTIAL/PLATELET
Basophils Absolute: 0 10*3/uL (ref 0.0–0.1)
Eosinophils Relative: 2.5 % (ref 0.0–5.0)
Lymphocytes Relative: 20.2 % (ref 12.0–46.0)
Monocytes Relative: 11.7 % (ref 3.0–12.0)
Platelets: 150 10*3/uL (ref 150.0–400.0)
RDW: 23 % — ABNORMAL HIGH (ref 11.5–14.6)
WBC: 2.7 10*3/uL — ABNORMAL LOW (ref 4.5–10.5)

## 2012-08-17 LAB — LIPASE: Lipase: 42 U/L (ref 11.0–59.0)

## 2012-08-17 MED ORDER — DICYCLOMINE HCL 10 MG PO CAPS: ORAL_CAPSULE | ORAL | Status: DC

## 2012-08-17 MED ORDER — OMEPRAZOLE 40 MG PO CPDR: 40.0000 mg | DELAYED_RELEASE_CAPSULE | Freq: Every day | ORAL | Status: DC

## 2012-08-17 NOTE — Patient Instructions (Addendum)
Please go to the basement level to have your labs drawn.  We sent prescriptions for Bentyl and Omeprazole to [facility]   You have been scheduled for a CT scan of the abdomen and pelvis at [facility] ([address]---this is in the same building as Press photographer).   You are scheduled on Fri 08-19-2012 at 2:00 Pm . You should arrive 15 minutes prior to your appointment time for registration. Please follow the written instructions below on the day of your exam:  WARNING: IF YOU ARE ALLERGIC TO IODINE/X-RAY DYE, PLEASE NOTIFY RADIOLOGY IMMEDIATELY AT [phone]! YOU WILL BE GIVEN A 13 HOUR PREMEDICATION PREP.  1) Do not eat or drink anything after 10:00 am  (4 hours prior to your test) 2) You have been given 2 bottles of oral contrast to drink. The solution may taste better if refrigerated, but do NOT add ice or any other liquid to this solution. Shake well before drinking.    Drink 1 bottle of contrast @ Noon (2 hours prior to your exam)  Drink 1 bottle of contrast @ 1:00 Pm  (1 hour prior to your exam)  You may take any medications as prescribed with a small amount of water except for the following: Metformin, Glucophage, Glucovance, Avandamet, Riomet, Fortamet, Actoplus Met, Janumet, Glumetza or Metaglip. The above medications must be held the day of the exam AND 48 hours after the exam.  The purpose of you drinking the oral contrast is to aid in the visualization of your intestinal tract. The contrast solution may cause some diarrhea. Before your exam is started, you will be given a small amount of fluid to drink. Depending on your individual set of symptoms, you may also receive an intravenous injection of x-ray contrast/dye. Plan on being at [facility] for 30 minutes or long, depending on the type of exam you are having performed.  If you have any questions regarding your exam or if you need to reschedule, you may call the CT department  at [phone] between the hours of 8:00 am and 5:00 pm, Monday-Friday.  ________________________________________________________________________

## 2012-08-17 NOTE — Progress Notes (Signed)
Subjective:    Patient ID: Michael [name], male    DOB: [birth year], 72 y.o.   MRN: [id number]  HPI Michael [name] is a pleasant 72 year old white male known to Dr. [name] who had undergone previous workup for iron deficiency anemia. He had endoscopy in May of 2013 which was normal and then colonoscopy in July of 2012 which was normal with the exception of internal hemorrhoids. He had capsule endoscopy done in June of 2013 which did show some proximal small bowel AVMs. At that time he was stable and not having any change in his hemoglobin and was decided not to move forward with push enteroscopy or double balloon enteroscopy and less he had recurrent more significant anemia. His hemoglobin in June of 2013 was 9.9 hematocrit of 29.5. He also has history of chronic inflammatory arthritis and has been followed by Dr. Charlestine Night. He had been on steroids long term and started methotrexate about 6 months ago in an effort to get him off of steroids. About 5 weeks ago ago he had his dose increased from 6 tablets weekly to 10 tablets weekly and shortly thereafter started having significant problems with heartburn and indigestion which she states was constant. He denies any dysphagia or odynophagia. They called Dr. Elmon Else office and were asked to start on omeprazole which he took for 14 days. He says after being on the omeprazole for a few days his symptoms improved significantly ,however he stopped taking it after 14 days because that was what was labeled on the box. Now over the past couple of weeks he has had ongoing problems with abdominal pain which he describes as being bilateral and crampy and sore in nature. He has not noticed any change in the symptoms with by mouth intake no change with bowel movements no diarrhea hematochezia or melena. He says his stools to stay dark because he is on iron. Has not had any fever no dysuria no nausea or vomiting. He is currently down to 5 mg per day of prednisone. His methotrexate has  been decreased to 6 tablets weekly over the past couple of weeks    Review of Systems  Constitutional: Negative.   HENT: Negative.   Eyes: Negative.   Respiratory: Negative.   Cardiovascular: Negative.   Gastrointestinal: Positive for abdominal pain.  Genitourinary: Negative.   Musculoskeletal: Positive for joint swelling and arthralgias.  Neurological: Negative.   Hematological: Negative.   Psychiatric/Behavioral: Negative.    Outpatient Encounter Prescriptions as of 08/17/2012  Medication Sig Dispense Refill  . acetaminophen (TYLENOL) 650 MG CR tablet Take 650 mg by mouth every 8 (eight) hours as needed. Take two tablets by mouth as needed      . allopurinol (ZYLOPRIM) 100 MG tablet Take 100 mg by mouth daily.      . Ferrous Sulfate (IRON) 325 (65 FE) MG TABS Take 1 tablet by mouth daily. Take 1 tablet by mouth three times daily  90 each  0  . gemfibrozil (LOPID) 600 MG tablet Take 600 mg by mouth 2 (two) times daily before a meal.      . glipiZIDE (GLUCOTROL) 10 MG tablet Take 10 mg by mouth daily.        [name] ketoconazole (NIZORAL) 2 % shampoo Apply 1 application topically daily.      Javier Docker Oil 500 MG CAPS Take 1 capsule by mouth daily.        [name] levothyroxine (SYNTHROID, LEVOTHROID) 150 MCG tablet Take 150 mcg by mouth daily.      [name]  lisinopril (PRINIVIL,ZESTRIL) 5 MG tablet Take 5 mg by mouth daily.      . metFORMIN (GLUCOPHAGE) 1000 MG tablet Take 1,000 mg by mouth 2 (two) times daily with a meal.        . methotrexate (RHEUMATREX) 2.5 MG tablet Take 2.5 mg by mouth once a week. Caution:Chemotherapy. Protect from light. Take 3 tablets by mouth in the morning and 3 tablets in the evening once a week      . Multiple Vitamin (MULTIVITAMIN) tablet Take 1 tablet by mouth daily.      . niacin (NIASPAN) 500 MG CR tablet Take 500 mg by mouth at bedtime.      . pravastatin (PRAVACHOL) 10 MG tablet Take 10 mg by mouth daily.      . sitaGLIPtin (JANUVIA) 100 MG tablet Take 100 mg by mouth  daily.      [name] dicyclomine (BENTYL) 10 MG capsule Take 1 tab 2-3 times daily as needed for cramping  90 capsule  1  . omeprazole (PRILOSEC) 40 MG capsule Take 1 capsule (40 mg total) by mouth daily.  90 capsule  3   Allergies  Allergen Reactions  . Percocet (Oxycodone-Acetaminophen) Nausea And Vomiting   Patient Active Problem List  Diagnosis  . Hypotension  . Diarrhea  . DM (diabetes mellitus)  . Thyroid disease  . Angiodysplasia of intestinal tract  . Iron deficiency anemia secondary to blood loss (chronic)   History   Social History Narrative  . No narrative on file   Family hx ;negative for colon cancer     Objective:   Physical Exam  well-developed older white male in no acute distress, pleasant accompanied by his wife blood pressure 140/72 pulse 88 height 5 foot 11 weight 251. HEENT; nontraumatic normocephalic EOMI PERRLA sclera anicteric, Supple no JVD, Cardiovascular; regular rate and rhythm with S1-S2 no murmur or gallop, Pulmonary;clear bilaterally, Abdomen; soft bowel sounds are active he is tender in the right upper quadrant and right mid quadrant and also mildly tender laterally in the left mid quadrant no guarding or rebound no palpable mass or hepatosplenomegaly, Rectal; exam not done, Extremities; no clubbing cyanosis or edema he does have arthritic changes of his hands bilaterally on Psych; mood and affect normal and appropriate       Assessment & Plan:  #74   72 year old white male with onset of heartburn and indigestion as well as rather generalized abdominal pain, more localized to the right abdomen on exam all coinciding with increased dose of methotrexate which is being used for his chronic inflammatory arthritis. I suspect his symptoms are all primarily side effects of methotrexate, he may have had an esophagitis which improved with PPI therapy. Etiology of the bilateral abdominal  pain is not clear-this again may be secondary to the methotrexate, will need to  rule out any hepatotoxicity, pancreatitis etc. Also rule out other intra-abdominal inflammatory process  #2 history of iron deficiency anemia felt secondary to proximal small bowel AVMs #3 adult-onset diabetes mellitus #4 hypothyroid  Plan; CBC with differential, lipase, and CMET today Restart PPI, he may finish what omeprazole 20 mg tablets he has at home and will send a prescription for omeprazole 40 mg by mouth daily to use over the next couple of months Add trial of Bentyl 10 mg by mouth twice a day as needed for cramping and discomfort CT scan of the abdomen and pelvis Patient and his wife are device to discuss ongoing GI symptoms with Dr. Shauna Hugh office and  see if further dose reduction of his methotrexate is indicated.

## 2012-08-19 ENCOUNTER — Ambulatory Visit (INDEPENDENT_AMBULATORY_CARE_PROVIDER_SITE_OTHER)
Admission: RE | Admit: 2012-08-19 | Discharge: 2012-08-19 | Disposition: A | Discharge status: Home / Self Care | Payer: Medicare Other | Source: Ambulatory Visit | Attending: Physician Assistant | Admitting: Physician Assistant

## 2012-08-19 DIAGNOSIS — R1084 Generalized abdominal pain: Secondary | ICD-10-CM

## 2012-08-19 DIAGNOSIS — K8020 Calculus of gallbladder without cholecystitis without obstruction: Secondary | ICD-10-CM | POA: Diagnosis not present

## 2012-08-19 MED ORDER — IOHEXOL 300 MG/ML  SOLN
100.0000 mL | Freq: Once | INTRAMUSCULAR | Status: AC | PRN
  Administered 2012-08-19: 100 mL via INTRAVENOUS

## 2012-08-19 NOTE — Progress Notes (Signed)
Agree with Ms. [name] management. Await CT. I would like to see him in follow-up in early March unless indicated otherwise by CT, clinical course.

## 2012-08-25 DIAGNOSIS — H348192 Central retinal vein occlusion, unspecified eye, stable: Secondary | ICD-10-CM | POA: Diagnosis not present

## 2012-09-02 DIAGNOSIS — Z79899 Other long term (current) drug therapy: Secondary | ICD-10-CM | POA: Diagnosis not present

## 2012-09-02 DIAGNOSIS — R6889 Other general symptoms and signs: Secondary | ICD-10-CM | POA: Diagnosis not present

## 2012-09-02 DIAGNOSIS — L408 Other psoriasis: Secondary | ICD-10-CM | POA: Diagnosis not present

## 2012-09-05 DIAGNOSIS — L4050 Arthropathic psoriasis, unspecified: Secondary | ICD-10-CM | POA: Diagnosis not present

## 2012-09-05 DIAGNOSIS — D709 Neutropenia, unspecified: Secondary | ICD-10-CM | POA: Diagnosis not present

## 2012-09-05 DIAGNOSIS — R1084 Generalized abdominal pain: Secondary | ICD-10-CM | POA: Diagnosis not present

## 2012-09-05 DIAGNOSIS — Z79899 Other long term (current) drug therapy: Secondary | ICD-10-CM | POA: Diagnosis not present

## 2012-09-10 ENCOUNTER — Observation Stay (HOSPITAL_COMMUNITY)
Admission: EM | Admit: 2012-09-10 | Discharge: 2012-09-11 | Disposition: A | Discharge status: Home / Self Care | Payer: Medicare Other | Attending: Internal Medicine | Admitting: Internal Medicine

## 2012-09-10 ENCOUNTER — Emergency Department (HOSPITAL_COMMUNITY): Payer: Medicare Other

## 2012-09-10 ENCOUNTER — Encounter (HOSPITAL_COMMUNITY): Payer: Self-pay | Admitting: Nurse Practitioner

## 2012-09-10 ENCOUNTER — Other Ambulatory Visit: Payer: Self-pay

## 2012-09-10 DIAGNOSIS — R079 Chest pain, unspecified: Secondary | ICD-10-CM | POA: Diagnosis present

## 2012-09-10 DIAGNOSIS — E039 Hypothyroidism, unspecified: Secondary | ICD-10-CM | POA: Insufficient documentation

## 2012-09-10 DIAGNOSIS — E669 Obesity, unspecified: Secondary | ICD-10-CM | POA: Insufficient documentation

## 2012-09-10 DIAGNOSIS — K209 Esophagitis, unspecified without bleeding: Secondary | ICD-10-CM | POA: Insufficient documentation

## 2012-09-10 DIAGNOSIS — R55 Syncope and collapse: Secondary | ICD-10-CM | POA: Diagnosis not present

## 2012-09-10 DIAGNOSIS — E119 Type 2 diabetes mellitus without complications: Secondary | ICD-10-CM | POA: Insufficient documentation

## 2012-09-10 DIAGNOSIS — E079 Disorder of thyroid, unspecified: Secondary | ICD-10-CM | POA: Diagnosis present

## 2012-09-10 DIAGNOSIS — R110 Nausea: Secondary | ICD-10-CM | POA: Diagnosis not present

## 2012-09-10 DIAGNOSIS — J40 Bronchitis, not specified as acute or chronic: Secondary | ICD-10-CM | POA: Diagnosis not present

## 2012-09-10 DIAGNOSIS — E785 Hyperlipidemia, unspecified: Secondary | ICD-10-CM | POA: Diagnosis not present

## 2012-09-10 DIAGNOSIS — Z79899 Other long term (current) drug therapy: Secondary | ICD-10-CM | POA: Insufficient documentation

## 2012-09-10 DIAGNOSIS — I10 Essential (primary) hypertension: Secondary | ICD-10-CM | POA: Diagnosis not present

## 2012-09-10 DIAGNOSIS — K5521 Angiodysplasia of colon with hemorrhage: Secondary | ICD-10-CM | POA: Insufficient documentation

## 2012-09-10 DIAGNOSIS — M129 Arthropathy, unspecified: Secondary | ICD-10-CM | POA: Diagnosis not present

## 2012-09-10 DIAGNOSIS — R404 Transient alteration of awareness: Secondary | ICD-10-CM | POA: Diagnosis not present

## 2012-09-10 DIAGNOSIS — M109 Gout, unspecified: Secondary | ICD-10-CM | POA: Diagnosis not present

## 2012-09-10 DIAGNOSIS — E1151 Type 2 diabetes mellitus with diabetic peripheral angiopathy without gangrene: Secondary | ICD-10-CM | POA: Diagnosis present

## 2012-09-10 LAB — CBC
Hemoglobin: 11.1 g/dL — ABNORMAL LOW (ref 13.0–17.0)
MCH: 31.4 pg (ref 26.0–34.0)
MCHC: 33.6 g/dL (ref 30.0–36.0)
Platelets: 139 10*3/uL — ABNORMAL LOW (ref 150–400)
RDW: 15.3 % (ref 11.5–15.5)

## 2012-09-10 LAB — COMPREHENSIVE METABOLIC PANEL
ALT: 14 U/L (ref 0–53)
AST: 17 U/L (ref 0–37)
Albumin: 3.9 g/dL (ref 3.5–5.2)
Alkaline Phosphatase: 41 U/L (ref 39–117)
Calcium: 9.2 mg/dL (ref 8.4–10.5)
GFR calc Af Amer: >90 mL/min (ref >90)
Potassium: 4.4 mEq/L (ref 3.5–5.1)
Sodium: 134 mEq/L — ABNORMAL LOW (ref 135–145)
Total Protein: 6.7 g/dL (ref 6.0–8.3)

## 2012-09-10 LAB — POCT I-STAT TROPONIN I: Troponin i, poc: 0.01 ng/mL (ref 0.00–0.08)

## 2012-09-10 LAB — TROPONIN I: Troponin I: <0.3 ng/mL (ref <0.30)

## 2012-09-10 LAB — GLUCOSE, CAPILLARY: Glucose-Capillary: 101 mg/dL — ABNORMAL HIGH (ref 70–99)

## 2012-09-10 MED ORDER — GEMFIBROZIL 600 MG PO TABS
600.0000 mg | ORAL_TABLET | Freq: Two times a day (BID) | ORAL | Status: DC
  Filled 2012-09-10 (×3): qty 1

## 2012-09-10 MED ORDER — ONDANSETRON HCL 4 MG PO TABS: 4.0000 mg | ORAL_TABLET | Freq: Four times a day (QID) | ORAL | Status: DC | PRN

## 2012-09-10 MED ORDER — SODIUM CHLORIDE 0.9 % IJ SOLN
3.0000 mL | Freq: Two times a day (BID) | INTRAMUSCULAR | Status: DC
  Administered 2012-09-10 – 2012-09-11 (×2): 3 mL via INTRAVENOUS

## 2012-09-10 MED ORDER — LEVOTHYROXINE SODIUM 150 MCG PO TABS
150.0000 ug | ORAL_TABLET | Freq: Every day | ORAL | Status: DC
Start: 2012-09-11 — End: 2012-09-11
  Administered 2012-09-11: 150 ug via ORAL
  Filled 2012-09-10 (×2): qty 1

## 2012-09-10 MED ORDER — ACETAMINOPHEN 325 MG PO TABS: 650.0000 mg | ORAL_TABLET | Freq: Four times a day (QID) | ORAL | Status: DC | PRN

## 2012-09-10 MED ORDER — ONDANSETRON HCL 4 MG/2ML IJ SOLN: 4.0000 mg | Freq: Four times a day (QID) | INTRAMUSCULAR | Status: DC | PRN

## 2012-09-10 MED ORDER — SIMVASTATIN 5 MG PO TABS
5.0000 mg | ORAL_TABLET | Freq: Every day | ORAL | Status: DC
  Administered 2012-09-10: 5 mg via ORAL
  Filled 2012-09-10 (×2): qty 1

## 2012-09-10 MED ORDER — SODIUM CHLORIDE 0.9 % IV SOLN: 250.0000 mL | INTRAVENOUS | Status: DC | PRN

## 2012-09-10 MED ORDER — LISINOPRIL 5 MG PO TABS
5.0000 mg | ORAL_TABLET | Freq: Every day | ORAL | Status: DC
  Administered 2012-09-10 – 2012-09-11 (×2): 5 mg via ORAL
  Filled 2012-09-10 (×2): qty 1

## 2012-09-10 MED ORDER — INSULIN ASPART 100 UNIT/ML ~~LOC~~ SOLN
0.0000 [IU] | Freq: Three times a day (TID) | SUBCUTANEOUS | Status: DC
  Administered 2012-09-11: 4 [IU] via SUBCUTANEOUS

## 2012-09-10 MED ORDER — ASPIRIN 81 MG PO CHEW
324.0000 mg | CHEWABLE_TABLET | Freq: Once | ORAL | Status: AC
  Administered 2012-09-10: 324 mg via ORAL
  Filled 2012-09-10: qty 4

## 2012-09-10 MED ORDER — ASPIRIN EC 81 MG PO TBEC
81.0000 mg | DELAYED_RELEASE_TABLET | Freq: Every day | ORAL | Status: DC
  Administered 2012-09-11: 81 mg via ORAL
  Filled 2012-09-10: qty 1

## 2012-09-10 MED ORDER — ENOXAPARIN SODIUM 60 MG/0.6ML ~~LOC~~ SOLN
60.0000 mg | SUBCUTANEOUS | Status: DC
  Administered 2012-09-10: 60 mg via SUBCUTANEOUS
  Filled 2012-09-10 (×2): qty 0.6

## 2012-09-10 MED ORDER — ONDANSETRON HCL 4 MG/2ML IJ SOLN
4.0000 mg | Freq: Once | INTRAMUSCULAR | Status: AC
  Administered 2012-09-10: 4 mg via INTRAVENOUS
  Filled 2012-09-10: qty 2

## 2012-09-10 MED ORDER — PANTOPRAZOLE SODIUM 40 MG PO TBEC
40.0000 mg | DELAYED_RELEASE_TABLET | Freq: Every day | ORAL | Status: DC
  Administered 2012-09-11: 40 mg via ORAL
  Filled 2012-09-10: qty 1

## 2012-09-10 MED ORDER — INSULIN ASPART 100 UNIT/ML ~~LOC~~ SOLN: 0.0000 [IU] | Freq: Every day | SUBCUTANEOUS | Status: DC

## 2012-09-10 MED ORDER — ALLOPURINOL 100 MG PO TABS
100.0000 mg | ORAL_TABLET | Freq: Every day | ORAL | Status: DC
  Administered 2012-09-11: 100 mg via ORAL
  Filled 2012-09-10 (×2): qty 1

## 2012-09-10 MED ORDER — SODIUM CHLORIDE 0.9 % IJ SOLN: 3.0000 mL | Freq: Two times a day (BID) | INTRAMUSCULAR | Status: DC

## 2012-09-10 MED ORDER — SODIUM CHLORIDE 0.9 % IJ SOLN: 3.0000 mL | INTRAMUSCULAR | Status: DC | PRN

## 2012-09-10 MED ORDER — ALBUTEROL SULFATE (5 MG/ML) 0.5% IN NEBU: 2.5000 mg | INHALATION_SOLUTION | RESPIRATORY_TRACT | Status: DC | PRN

## 2012-09-10 MED ORDER — ACETAMINOPHEN 650 MG RE SUPP: 650.0000 mg | Freq: Four times a day (QID) | RECTAL | Status: DC | PRN

## 2012-09-10 MED ORDER — ENOXAPARIN SODIUM 40 MG/0.4ML ~~LOC~~ SOLN: 40.0000 mg | SUBCUTANEOUS | Status: DC

## 2012-09-10 NOTE — H&P (Signed)
[facility] History and Physical  Michael [name] [id number] DOB: 1941/05/05 DOA: 09/10/2012   PCP: [name], MD  Specialists: He is followed by [facility] gastroenterology for history of AVMs and chronic bleeding.  Chief Complaint: Almost passed out  HPI: Michael [name] is a 72 y.o. male with a past medical history of type 2 diabetes, hypertension, gout, obesity, chronic GI bleeding who was in his usual state of health earlier today when he was the helping with the installation of a washer and dryer in his house. He was helping move the old washer and dryer out of the laundry room when the patient got lightheaded. He describes this as a swimmy sensation. Did not pass out. Did not have any focal weakness. He felt nauseated. He started sweating a lot. Denied any chest pain or shortness of breath during this episode. No fever or chills recently. No sickness recently. His wife got very concerned and she brought him to the hospital. In the hospital his symptoms resolved. Now is complaining of the feeling hungry. He denies any recent injuries or falls. He says, that he used to get leg swelling in the past, but not currently. He was seen by gastroenterology in January for esophagitis. It was felt to be secondary to Methotrexate and resolved after he was placed on a proton pump inhibitor. He also tells me that over the last week or so in the middle of the night he feels, as if he needs to burp. Gets discomfort in the middle of his chest which goes away after several minutes. Patient is not a very good historian. He did not know the names of many of his physicians. He did not know the medications that he takes at home.  Home Medications: Prior to Admission medications   Medication Sig Start Date End Date Taking? Authorizing Provider  acetaminophen (TYLENOL) 650 MG CR tablet Take 650 mg by mouth every 8 (eight) hours as needed. Take two tablets by mouth as needed    Historical Provider, MD  allopurinol  (ZYLOPRIM) 100 MG tablet Take 100 mg by mouth daily.    Historical Provider, MD  dicyclomine (BENTYL) 10 MG capsule Take 1 tab 2-3 times daily as needed for cramping 08/17/12   Amy S Esterwood, PA  Ferrous Sulfate (IRON) 325 (65 FE) MG TABS Take 1 tablet by mouth daily. Take 1 tablet by mouth three times daily 12/23/11   Velvet Bathe, MD  gemfibrozil (LOPID) 600 MG tablet Take 600 mg by mouth 2 (two) times daily before a meal.    Historical Provider, MD  glipiZIDE (GLUCOTROL) 10 MG tablet Take 10 mg by mouth daily.      Historical Provider, MD  ketoconazole (NIZORAL) 2 % shampoo Apply 1 application topically daily.    Historical Provider, MD  Javier Docker Oil 500 MG CAPS Take 1 capsule by mouth daily.      Historical Provider, MD  levothyroxine (SYNTHROID, LEVOTHROID) 150 MCG tablet Take 150 mcg by mouth daily.    Historical Provider, MD  lisinopril (PRINIVIL,ZESTRIL) 5 MG tablet Take 5 mg by mouth daily.    Historical Provider, MD  metFORMIN (GLUCOPHAGE) 1000 MG tablet Take 1,000 mg by mouth 2 (two) times daily with a meal.      Historical Provider, MD  methotrexate (RHEUMATREX) 2.5 MG tablet Take 2.5 mg by mouth once a week. Caution:Chemotherapy. Protect from light. Take 3 tablets by mouth in the morning and 3 tablets in the evening once a week  Historical Provider, MD  Multiple Vitamin (MULTIVITAMIN) tablet Take 1 tablet by mouth daily.    Historical Provider, MD  niacin (NIASPAN) 500 MG CR tablet Take 500 mg by mouth at bedtime.    Historical Provider, MD  omeprazole (PRILOSEC) 40 MG capsule Take 1 capsule (40 mg total) by mouth daily. 08/17/12   Amy S Esterwood, PA  pravastatin (PRAVACHOL) 10 MG tablet Take 10 mg by mouth daily.    Historical Provider, MD  sitaGLIPtin (JANUVIA) 100 MG tablet Take 100 mg by mouth daily.    Historical Provider, MD    Allergies:  Allergies  Allergen Reactions  . Percocet (Oxycodone-Acetaminophen) Nausea And Vomiting    Past Medical History: Past Medical History   Diagnosis Date  . Diabetes mellitus   . Hypertension   . Hyperlipidemia   . Gout   . Arthritis   . Thyroid disease     hypothyroidism  . Personal history of colonic polyps 2007, 2008    adenoma each time, largest 12 mm in 2007  . Iron deficiency anemia   . AVM (arteriovenous malformation) of colon     Past Surgical History  Procedure Laterality Date  . Rotator cuff repair      left  . Knee arthroscopy      right  . Colonoscopy w/ polypectomy  04/09/2006    12 mm adenoma  . Colonoscopy w/ polypectomy  06/17/2007    5 mm adenoma  . Colonoscopy  02/10/2011    internal hemorrhoids  . Esophagogastroduodenoscopy  12/23/2011    Procedure: ESOPHAGOGASTRODUODENOSCOPY (EGD);  Surgeon: Irene Shipper, MD;  Location: [facility];  Service: Endoscopy;  Laterality: N/A;  with small bowel bx's  . Givens capsule study  12/28/2011    Social History:  reports that he quit smoking about 6 years ago. He has never used smokeless tobacco. He reports that he drinks about 0.6 ounces of alcohol per week. He reports that he does not use illicit drugs.  Living Situation: Lives in [place] with his wife Activity Level: Independent with his usual activities   Family History:  Family History  Problem Relation Age of Onset  . Malignant hyperthermia Neg Hx   . Heart attack Father      Review of Systems - History obtained from the patient General ROS: positive for  - fatigue Psychological ROS: negative Ophthalmic ROS: negative ENT ROS: negative Allergy and Immunology ROS: negative Hematological and Lymphatic ROS: negative Endocrine ROS: negative Respiratory ROS: no cough, shortness of breath, or wheezing Cardiovascular ROS: as in hpi Gastrointestinal ROS: as in hpi Genito-Urinary ROS: no dysuria, trouble voiding, or hematuria Musculoskeletal ROS: negative Neurological ROS: no TIA or stroke symptoms Dermatological ROS: negative  Physical Examination  Filed Vitals:   09/10/12 1415 09/10/12  1417 09/10/12 1419 09/10/12 1550  BP: 120/76 130/63 128/67 145/52  Pulse: 79 85 85 75  Temp:      TempSrc:      Resp:    18  SpO2:    98%   General appearance: alert, cooperative, appears stated age and no distress Head: Normocephalic, without obvious abnormality, atraumatic Eyes: conjunctivae/corneas clear. PERRL, EOM's intact. Throat: lips, mucosa, and tongue normal; teeth and gums normal Neck: no adenopathy, no carotid bruit, no JVD, supple, symmetrical, trachea midline and thyroid not enlarged, symmetric, no tenderness/mass/nodules Resp: clear to auscultation bilaterally Cardio: regular rate and rhythm, S1, S2 normal. Systolic murmur at aortic area. No click, rub or gallop. Possible bruit left carotid. GI: soft, non-tender; bowel  sounds normal; no masses,  no organomegaly Extremities: extremities normal, atraumatic, no cyanosis or edema Pulses: 2+ and symmetric Skin: Skin color, texture, turgor normal. No rashes or lesions Lymph nodes: Cervical, supraclavicular, and axillary nodes normal. Neurologic: He is alert and oriented x3. No focal neurological deficits are present  Laboratory Data: Results for orders placed during the hospital encounter of 09/10/12 (from the past 48 hour(s))  CBC     Status: Abnormal   Collection Time    09/10/12  2:01 PM      Result Value Range   WBC 4.3  4.0 - 10.5 K/uL   RBC 3.54 (*) 4.22 - 5.81 MIL/uL   Hemoglobin 11.1 (*) 13.0 - 17.0 g/dL   HCT 33.0 (*) 39.0 - 52.0 %   MCV 93.2  78.0 - 100.0 fL   MCH 31.4  26.0 - 34.0 pg   MCHC 33.6  30.0 - 36.0 g/dL   RDW 15.3  11.5 - 15.5 %   Platelets 139 (*) 150 - 400 K/uL  COMPREHENSIVE METABOLIC PANEL     Status: Abnormal   Collection Time    09/10/12  2:01 PM      Result Value Range   Sodium 134 (*) 135 - 145 mEq/L   Potassium 4.4  3.5 - 5.1 mEq/L   Chloride 101  96 - 112 mEq/L   CO2 24  19 - 32 mEq/L   Glucose, Bld 279 (*) 70 - 99 mg/dL   BUN 22  6 - 23 mg/dL   Creatinine, Ser 0.91  0.50 - 1.35  mg/dL   Calcium 9.2  8.4 - 10.5 mg/dL   Total Protein 6.7  6.0 - 8.3 g/dL   Albumin 3.9  3.5 - 5.2 g/dL   AST 17  0 - 37 U/L   ALT 14  0 - 53 U/L   Alkaline Phosphatase 41  39 - 117 U/L   Total Bilirubin 0.4  0.3 - 1.2 mg/dL   GFR calc non Af Amer 84 (*) >90 mL/min   GFR calc Af Amer >90  >90 mL/min   Comment:            The eGFR has been calculated     using the CKD EPI equation.     This calculation has not been     validated in all clinical     situations.     eGFR's persistently     <90 mL/min signify     possible Chronic Kidney Disease.  MAGNESIUM     Status: None   Collection Time    09/10/12  2:01 PM      Result Value Range   Magnesium 1.7  1.5 - 2.5 mg/dL  POCT I-STAT TROPONIN I     Status: None   Collection Time    09/10/12  2:43 PM      Result Value Range   Troponin i, poc 0.01  0.00 - 0.08 ng/mL   Comment 3            Comment: Due to the release kinetics of cTnI,     a negative result within the first hours     of the onset of symptoms does not rule out     myocardial infarction with certainty.     If myocardial infarction is still suspected,     repeat the test at appropriate intervals.    Radiology Reports: Dg Chest 2 View  09/10/2012  *RADIOLOGY REPORT*  Clinical Data: Cough, congestion.  CHEST - 2 VIEW  Comparison: 08/28/2009  Findings: Cardiomegaly.  Mild peribronchial thickening.  No confluent airspace opacities or effusions.  No edema.  No acute bony abnormality.  IMPRESSION: Cardiomegaly, bronchitic changes.   Original Report Authenticated By: Rolm Baptise, M.D.     Electrocardiogram: Sinus rhythm 70 beats per minute. Normal axis. Intervals are normal. There is subtle ST changes and T inversion seen in V5 and V6. Subtle difference from EKG done in May of 2013.  Problem List  Principal Problem:   Near syncope Active Problems:   DM (diabetes mellitus)   Thyroid disease   Chest pain at rest   HTN (hypertension), benign   Obesity    Gout   Assessment: This is a 72 year old, Caucasian male with medical history as stated earlier, who presents after what sounds like a near syncopal episode at home with exertion. This could be an angina equivalent. His chest discomfort in the middle of the night is more suspicious for GI etiology. However, he does have multiple risk factors for coronary artery disease. He reports having a stress test many years ago. No report is available in our system. Likelihood of venous thromboembolism is quite low.  Plan: #1 near syncope: Orthostatics were performed, which were normal. We will admit the patient to telemetry. Cycle troponins. He did have a subtle EKG changes. EKG will be repeated. Echocardiogram and carotid Dopplers will be obtained. If he rules out for ACS and if he is asymptomatic, most of the workup can be pursued as an outpatient. Aspirin will be provided.  #2 history of type 2 diabetes: Sliding scale insulin will be provided. CBGs will be monitored.  #3 history of hypothyroidism: Continue with l-thyroxine. No indication to check thyroid function tests. Can be deferred to PCP.  #4 history of chronic GI bleed secondary to AVMs: His hemoglobin is baseline. Continue with iron supplementation post discharge. Continue with PPI for esophagitis that was diagnosed recently. Off note it was felt that the Methotrexate was contributing to his presentation and was discontinued after consultation with his rheumatologist.  DVT Prophylaxis: Enoxaparin Code Status: Full code Family Communication: Discussed with patient at bedside.  Disposition Plan: Likely return home when better  Further management decisions will depend on results of further testing and patient's response to treatment.  [facility]  [facility] Pager [phone]  If 7PM-7AM, please contact night-coverage [email or web address] Password [facility]  09/10/2012, 4:31 PM

## 2012-09-10 NOTE — ED Notes (Signed)
To [employer], Therapist, sports

## 2012-09-10 NOTE — ED Notes (Addendum)
Per EMS: Near syncope associated with BM; displayed diaphoresis and nausea. 62, 160/90 sitting, 78, 140/82 standing. Glucose 315. No chest pain or SOB. Refused Zofran en route.  20g PIV in right hand. Hx of  DM, gout, htn, hyperlipidemia.

## 2012-09-10 NOTE — ED Notes (Signed)
[id number] Expected date:09/10/12<BR> Expected time:12:09 PM<BR> Means of arrival:<BR> Comments:<BR> Near syncope

## 2012-09-10 NOTE — ED Provider Notes (Signed)
History     CSN: [id number]  Arrival date & time 09/10/12  1228   First MD Initiated Contact with Patient 09/10/12 1237      Chief Complaint  Patient presents with  . Near Syncope  . Nausea    (Consider location/radiation/quality/duration/timing/severity/associated sxs/prior treatment) HPI Comments: Mr. Forlenza presents via EMS from home.  He moved a washer and dryer this morning and was working with some wall board when he became suddenly nauseated.  He is reported to become sweaty and complained to his wife of feeling extremely lightheaded.  The lightheadedness has since resolved, however he reports ongoing nausea.  He denies fainting, CP, SOB, palpitations, and vomiting.  During this episode his blood sugar was checked by his wife and noted to be 280.  He had an elevate systolic BP also ([id number]).  Over the last 1-2 weeks he has complained of intermittent chest discomfort.  It has been especially noticeable at night.  He describes it as feeling like a burp that will not "come up".  He also reports having intermittent leg and calf cramps.  He however denies claudication and reflux.    The history is provided by the patient and the spouse. No language interpreter was used.    Past Medical History  Diagnosis Date  . Diabetes mellitus   . Hypertension   . Hyperlipidemia   . Gout   . Arthritis   . Thyroid disease     hypothyroidism  . Personal history of colonic polyps 2007, 2008    adenoma each time, largest 12 mm in 2007  . Iron deficiency anemia   . AVM (arteriovenous malformation) of colon     Past Surgical History  Procedure Laterality Date  . Rotator cuff repair      left  . Knee arthroscopy      right  . Colonoscopy w/ polypectomy  04/09/2006    12 mm adenoma  . Colonoscopy w/ polypectomy  06/17/2007    5 mm adenoma  . Colonoscopy  02/10/2011    internal hemorrhoids  . Esophagogastroduodenoscopy  12/23/2011    Procedure: ESOPHAGOGASTRODUODENOSCOPY (EGD);  Surgeon: Irene Shipper, MD;  Location: [facility];  Service: Endoscopy;  Laterality: N/A;  with small bowel bx's  . Givens capsule study  12/28/2011    Family History  Problem Relation Age of Onset  . Malignant hyperthermia Neg Hx     History  Substance Use Topics  . Smoking status: Former Smoker    Quit date: 03/29/2006  . Smokeless tobacco: Never Used  . Alcohol Use: 0.6 oz/week    1 Cans of beer per week     Comment: occasional alcohol intake      Review of Systems  Constitutional: Positive for diaphoresis and fatigue. Negative for fever, chills, activity change and appetite change.  HENT: Negative for nosebleeds, congestion, sore throat, rhinorrhea, trouble swallowing and neck stiffness.   Eyes: Negative for visual disturbance.  Respiratory: Negative for cough, choking, chest tightness, shortness of breath and wheezing.   Cardiovascular: Negative for chest pain, palpitations and leg swelling.  Gastrointestinal: Positive for nausea. Negative for vomiting, abdominal pain, diarrhea and constipation.  Endocrine: Negative for polyuria.  Genitourinary: Negative for dysuria.  Musculoskeletal: Negative for myalgias, back pain and joint swelling.  Skin: Negative for rash and wound.  Neurological: Positive for weakness (diffuse), light-headedness and headaches. Negative for dizziness, syncope and numbness.  Psychiatric/Behavioral: Negative for confusion.    Allergies  Percocet  Home Medications   Current  Outpatient Rx  Name  Route  Sig  Dispense  Refill  . acetaminophen (TYLENOL) 650 MG CR tablet   Oral   Take 650 mg by mouth every 8 (eight) hours as needed. Take two tablets by mouth as needed         . allopurinol (ZYLOPRIM) 100 MG tablet   Oral   Take 100 mg by mouth daily.         [name] dicyclomine (BENTYL) 10 MG capsule      Take 1 tab 2-3 times daily as needed for cramping   90 capsule   1   . Ferrous Sulfate (IRON) 325 (65 FE) MG TABS   Oral   Take 1 tablet by mouth daily.  Take 1 tablet by mouth three times daily   90 each   0   . gemfibrozil (LOPID) 600 MG tablet   Oral   Take 600 mg by mouth 2 (two) times daily before a meal.         . glipiZIDE (GLUCOTROL) 10 MG tablet   Oral   Take 10 mg by mouth daily.           [name] ketoconazole (NIZORAL) 2 % shampoo   Topical   Apply 1 application topically daily.         Javier Docker Oil 500 MG CAPS   Oral   Take 1 capsule by mouth daily.           [name] levothyroxine (SYNTHROID, LEVOTHROID) 150 MCG tablet   Oral   Take 150 mcg by mouth daily.         [name] lisinopril (PRINIVIL,ZESTRIL) 5 MG tablet   Oral   Take 5 mg by mouth daily.         . metFORMIN (GLUCOPHAGE) 1000 MG tablet   Oral   Take 1,000 mg by mouth 2 (two) times daily with a meal.           . methotrexate (RHEUMATREX) 2.5 MG tablet   Oral   Take 2.5 mg by mouth once a week. Caution:Chemotherapy. Protect from light. Take 3 tablets by mouth in the morning and 3 tablets in the evening once a week         . Multiple Vitamin (MULTIVITAMIN) tablet   Oral   Take 1 tablet by mouth daily.         . niacin (NIASPAN) 500 MG CR tablet   Oral   Take 500 mg by mouth at bedtime.         [name] omeprazole (PRILOSEC) 40 MG capsule   Oral   Take 1 capsule (40 mg total) by mouth daily.   90 capsule   3   . pravastatin (PRAVACHOL) 10 MG tablet   Oral   Take 10 mg by mouth daily.         . sitaGLIPtin (JANUVIA) 100 MG tablet   Oral   Take 100 mg by mouth daily.           BP 128/67  Pulse 85  Temp(Src) 98 F (36.7 C) (Oral)  Resp 18  SpO2 98%  Physical Exam  Nursing note and vitals reviewed. Constitutional: He is oriented to person, place, and time. He appears well-nourished. No distress.  Pt is obese   HENT:  Head: Normocephalic and atraumatic.  Right Ear: External ear normal.  Left Ear: External ear normal.  Nose: Nose normal.  Mouth/Throat: Oropharynx is clear and moist. No oropharyngeal exudate.  Eyes: Conjunctivae  are normal. Pupils are equal, round, and reactive to light. Right eye exhibits no discharge. Left eye exhibits no discharge. No scleral icterus.  Neck: Normal range of motion. Neck supple. No JVD present. No tracheal deviation present.  No carotid bruits   Cardiovascular: Normal rate, regular rhythm, S1 normal, S2 normal, normal heart sounds, intact distal pulses and normal pulses.   No extrasystoles are present. PMI is not displaced.  Exam reveals no gallop, no friction rub and no decreased pulses.   No murmur heard. Pulmonary/Chest: Effort normal and breath sounds normal. No stridor. No respiratory distress. He has no wheezes. He has no rales. He exhibits no tenderness.  Abdominal: Soft. Bowel sounds are normal. He exhibits no distension and no mass. There is no tenderness. There is no rebound and no guarding.  Musculoskeletal: Normal range of motion. He exhibits no edema and no tenderness.  Lymphadenopathy:    He has no cervical adenopathy.  Neurological: He is alert and oriented to person, place, and time. No cranial nerve deficit. He exhibits normal muscle tone.  Skin: Skin is warm. No rash noted. He is not diaphoretic. No erythema. No pallor.  Psychiatric: He has a normal mood and affect. His behavior is normal.    ED Course  Procedures (including critical care time)  Labs Reviewed  CBC  COMPREHENSIVE METABOLIC PANEL  CALCIUM, IONIZED  MAGNESIUM   No results found.   No diagnosis found.   Date: 09/10/2012 @ 1227  Rate: 70 bpm  Rhythm: sinus  QRS Axis: normal  Intervals: normal  ST/T Wave abnormalities: nonspecific ST changes  Conduction Disutrbances:note repol abnormality  Narrative Interpretation:   Old EKG Reviewed: unchanged      MDM  Pt presents for evaluation after a near syncopal event.  He has also been experiencing intermittent chest pain at night and intermittent leg cramping.  He currently c/o ongoing nausea, note stable VS and nl respiratory effort, NAD.   Will obtain orthostatic VS, basic labs, EKG, CXR, serum magnesium and ionized calcium.    1600.  Pt stable, NAD.  Labs are unremarkable.  Trop is negative x1.  CXR is clear.  He has multiple risk factors for CAD and the episodes of discomfort he and his wife describe are concerning for heart related chest discomfort.  Discussed his evaluation with Dr. Maryland Pink.  Plan admit for further mgmnt.     Perlie Mayo, MD 09/10/12 [phone]

## 2012-09-10 NOTE — ED Notes (Signed)
Per pt:  Pt had a BM and then about an hour afterwards, as he was cutting tile boards, he began feeling light-headed and nauseated.  Currently, feeling only nausea.  Wife at bedside.

## 2012-09-10 NOTE — Progress Notes (Signed)
Rx:  Brief Lovenox note  Wt=113 kg BMI= 34 Rx adjusted Lovenox to 0.5mg /kg (60mg ) daily for DVT prophylaxis in pt with BMI >30.   Dorrene German 09/10/2012 6:55 PM

## 2012-09-11 DIAGNOSIS — R55 Syncope and collapse: Secondary | ICD-10-CM | POA: Diagnosis not present

## 2012-09-11 DIAGNOSIS — R42 Dizziness and giddiness: Secondary | ICD-10-CM

## 2012-09-11 DIAGNOSIS — I10 Essential (primary) hypertension: Secondary | ICD-10-CM

## 2012-09-11 DIAGNOSIS — I517 Cardiomegaly: Secondary | ICD-10-CM | POA: Diagnosis not present

## 2012-09-11 DIAGNOSIS — E079 Disorder of thyroid, unspecified: Secondary | ICD-10-CM | POA: Diagnosis not present

## 2012-09-11 DIAGNOSIS — E119 Type 2 diabetes mellitus without complications: Secondary | ICD-10-CM | POA: Diagnosis not present

## 2012-09-11 LAB — TROPONIN I: Troponin I: <0.3 ng/mL (ref <0.30)

## 2012-09-11 LAB — COMPREHENSIVE METABOLIC PANEL
ALT: 14 U/L (ref 0–53)
AST: 22 U/L (ref 0–37)
Albumin: 3.6 g/dL (ref 3.5–5.2)
Calcium: 8.9 mg/dL (ref 8.4–10.5)
GFR calc Af Amer: 81 mL/min — ABNORMAL LOW (ref >90)
Sodium: 139 mEq/L (ref 135–145)
Total Protein: 6.6 g/dL (ref 6.0–8.3)

## 2012-09-11 LAB — CBC
HCT: 30.9 % — ABNORMAL LOW (ref 39.0–52.0)
Hemoglobin: 10.6 g/dL — ABNORMAL LOW (ref 13.0–17.0)
MCH: 31.9 pg (ref 26.0–34.0)
MCHC: 34.3 g/dL (ref 30.0–36.0)

## 2012-09-11 LAB — GLUCOSE, CAPILLARY: Glucose-Capillary: 174 mg/dL — ABNORMAL HIGH (ref 70–99)

## 2012-09-11 MED ORDER — PREDNISONE 5 MG PO TABS
5.0000 mg | ORAL_TABLET | Freq: Every morning | ORAL | Status: DC
  Administered 2012-09-11: 5 mg via ORAL
  Filled 2012-09-11: qty 1

## 2012-09-11 NOTE — Progress Notes (Signed)
VASCULAR LAB PRELIMINARY  PRELIMINARY  PRELIMINARY  PRELIMINARY  Carotid Dopplers completed.    Preliminary report:  There is <40% right ICA stenosis. Right vertebral artery flow is abnormal, exhibiting the "bunny sign," suggestive of a more proximal stenosis.  There is 40-50% left ICA stenosis.  Left vertebral artery flow is antegrade  Valisha Heslin, RVT 09/11/2012, 9:34 AM

## 2012-09-11 NOTE — Discharge Summary (Signed)
[facility]  Physician Discharge Summary   Patient ID: Michael [name] MRN: [id number] DOB/AGE: 70-Apr-1942 70 y.o.  Admit date: 09/10/2012 Discharge date: 09/11/2012  PCP: [name], MD  DISCHARGE DIAGNOSES:  Principal Problem:   Near syncope Active Problems:   DM (diabetes mellitus)   Thyroid disease   Chest pain at rest   HTN (hypertension), benign   Obesity   Gout   RECOMMENDATIONS FOR OUTPATIENT FOLLOW UP: 1. ECHO done today. Results not available at discharge. 2. Please consider referral to cardiology for stress testing if ECHO looks fine. 3. Has minimal stenosis in carotids. Will need yearly surveillance  DISCHARGE CONDITION: fair  Diet recommendation: Mod Carb  Filed Weights   09/10/12 1829  Weight: 118.389 kg (261 lb)    INITIAL HISTORY: Michael [name] is a 72 y.o. male with a past medical history of type 2 diabetes, hypertension, gout, obesity, chronic GI bleeding who was in his usual state of health on the day of admission when he was the helping with the installation of a washer and dryer in his house. He was helping move the old washer and dryer out of the laundry room when the patient got lightheaded. He describes this as a swimmy sensation. Did not pass out. Did not have any focal weakness. He felt nauseated. He started sweating a lot. Denied any chest pain or shortness of breath during this episode. No fever or chills recently. No sickness recently. His wife got very concerned and she brought him to the hospital. In the hospital his symptoms resolved. He denied any recent injuries or falls. He says, that he used to get leg swelling in the past, but not currently. He was seen by gastroenterology in January for esophagitis. It was felt to be secondary to Methotrexate and resolved after he was placed on a proton pump inhibitor. He also tells me that over the last week or so in the middle of the night he feels, as if he needs to burp. Gets discomfort in the  middle of his chest which goes away after several minutes. Patient is not a very good historian.   Consultations:  None  Procedures: ECHO done 2/16. Report not available  Carotid Doppler: Prelim Report: There is <40% right ICA stenosis. Right vertebral artery flow is abnormal, exhibiting the "bunny sign," suggestive of a more proximal stenosis. There is 40-50% left ICA stenosis. Left vertebral artery flow is antegrade   HOSPITAL COURSE:   #1 Near syncope/Chest Discomfort: Etiology unclear. He does have a history of orthostatic hypotension per patient. But orthostatics performed here were normal. No arrythmias noted on tele. No neurological deficits. Patient feels better today. No recurrence of symptoms. No chest pain. Troponins are normal. EKG has non specific changes. ECHO done but report is pending. If he doesn't have aortic stenosis or other concerning findings he should be considered for a stress test or referral to cardiology. PCP to see preliminary carotid dopplers report above. PCP to consider additional testing as needed. Aspirin was considered but because of GI bleed issues we will hold off for now. If he indeed does have heart disease risk-benefit ratio will change.   #2 History of type 2 diabetes: Continue home medication regimen.   #3 History of hypothyroidism: Continue with l-thyroxine.  #4 History of chronic GI bleed secondary to AVMs: His hemoglobin is baseline. Continue with iron supplementation post discharge. Continue with PPI for esophagitis that was diagnosed recently. Off note it was felt that the Methotrexate was  contributing to his presentation and was discontinued after consultation with his rheumatologist.   #5 Chronic arthritis: On prednisone at home. Continue with same.  Overall patient is stable. ECHo report to be followed up on. But he need not wait for results. If he can ambulate in hallway with no difficulty he can be discharged.   PERTINENT LABS:  The  results of significant diagnostics from this hospitalization (including imaging, microbiology, ancillary and laboratory) are listed below for reference.     Labs: Basic Metabolic Panel:  Recent Labs Lab 09/10/12 1401 09/11/12 0053  NA 134* 139  K 4.4 3.8  CL 101 102  CO2 24 27  GLUCOSE 279* 114*  BUN 22 21  CREATININE 0.91 1.06  CALCIUM 9.2 8.9  MG 1.7  --    Liver Function Tests:  Recent Labs Lab 09/10/12 1401 09/11/12 0053  AST 17 22  ALT 14 14  ALKPHOS 41 34*  BILITOT 0.4 0.4  PROT 6.7 6.6  ALBUMIN 3.9 3.6   CBC:  Recent Labs Lab 09/10/12 1401 09/11/12 0053  WBC 4.3 3.7*  HGB 11.1* 10.6*  HCT 33.0* 30.9*  MCV 93.2 93.1  PLT 139* 135*   Cardiac Enzymes:  Recent Labs Lab 09/10/12 2005 09/11/12 0053 09/11/12 0650  TROPONINI <0.30 <0.30 <0.30   CBG:  Recent Labs Lab 09/10/12 1833 09/10/12 2131 09/11/12 0725  GLUCAP 101* 106* 174*     IMAGING STUDIES Dg Chest 2 View  09/10/2012  *RADIOLOGY REPORT*  Clinical Data: Cough, congestion.  CHEST - 2 VIEW  Comparison: 08/28/2009  Findings: Cardiomegaly.  Mild peribronchial thickening.  No confluent airspace opacities or effusions.  No edema.  No acute bony abnormality.  IMPRESSION: Cardiomegaly, bronchitic changes.   Original Report Authenticated By: Rolm Baptise, M.D.    Ct Abdomen Pelvis W Contrast  08/19/2012  *RADIOLOGY REPORT*  Clinical Data: Diffuse abdominal pain and nausea.  CT ABDOMEN AND PELVIS WITH CONTRAST  Technique:  Multidetector CT imaging of the abdomen and pelvis was performed following the standard protocol during bolus administration of intravenous contrast.  Contrast: 132mL OMNIPAQUE IOHEXOL 300 MG/ML  SOLN  Comparison: 12/22/2011  Findings: Mild distal esophageal wall thickening is noted, suspicious for esophagitis.  Calcified gallstones are again seen, however there is no evidence of cholecystitis or biliary ductal dilatation.  The liver, spleen, pancreas, adrenal glands, and kidneys  are normal appearance.  No evidence of hydronephrosis.  A few tiny sub-centimeter renal cysts are noted.  No soft tissue masses or lymphadenopathy identified within the abdomen or pelvis.  Normal appendix is visualized.  No evidence of inflammatory process or abnormal fluid collections.  No evidence of bowel wall thickening, dilatation, or hernia.  IMPRESSION:  1.  Mild distal esophageal wall thickening, suspicious for esophagitis. 2.  Cholelithiasis.  No evidence of cholecystitis or biliary dilatation.   Original Report Authenticated By: Earle Gell, M.D.     DISCHARGE EXAMINATION: Filed Vitals:   09/10/12 1550 09/10/12 1829 09/10/12 2144 09/11/12 0600  BP: 145/52 167/93 154/58 134/78  Pulse: 75 70 71 74  Temp:  97.6 F (36.4 C) 98.3 F (36.8 C) 97.9 F (36.6 C)  TempSrc:  Oral Oral Oral  Resp: 18 18 20 18   Height:  5\' 10"  (1.778 m)    Weight:  118.389 kg (261 lb)    SpO2: 98% 98% 96% 97%   General appearance: alert, cooperative, appears stated age and no distress Resp: clear to auscultation bilaterally Cardio: regular rate and rhythm, S1, S2 normal,  no murmur, click, rub or gallop GI: soft, non-tender; bowel sounds normal; no masses,  no organomegaly Neurologic: Alert and oriented X 3, normal strength and tone. Normal symmetric reflexes. Normal coordination and gait  DISPOSITION: Home with wife  Discharge Orders   Future Orders Complete By Expires     Diet Carb Modified  As directed     Discharge instructions  As directed     Comments:      Call your PCP on Monday to schedule follow up. Seek attention if you develop chest pain or shortness of breath. The results of your Echocardiogram will be available later today or tomorrow.    Increase activity slowly  As directed       Current Discharge Medication List    CONTINUE these medications which have NOT CHANGED   Details  allopurinol (ZYLOPRIM) 100 MG tablet Take 200 mg by mouth every morning.     ferrous sulfate 325 (65 FE) MG  tablet Take 325 mg by mouth 3 (three) times daily with meals.    gemfibrozil (LOPID) 600 MG tablet Take 600 mg by mouth 2 (two) times daily before a meal.    glipiZIDE (GLUCOTROL XL) 10 MG 24 hr tablet Take 10 mg by mouth every morning.     KRILL OIL 1000 MG CAPS Take 1 capsule by mouth every morning.    levothyroxine (SYNTHROID, LEVOTHROID) 150 MCG tablet Take 150 mcg by mouth daily before breakfast.    lisinopril (PRINIVIL,ZESTRIL) 20 MG tablet Take 20 mg by mouth every morning.     metFORMIN (GLUCOPHAGE) 1000 MG tablet Take 1,000 mg by mouth 2 (two) times daily with a meal.      niacin 500 MG CR capsule Take 1,000 mg by mouth every morning.    omeprazole (PRILOSEC) 40 MG capsule Take 1 capsule (40 mg total) by mouth daily. Qty: 90 capsule, Refills: 3    pravastatin (PRAVACHOL) 10 MG tablet Take 10 mg by mouth at bedtime.    predniSONE (DELTASONE) 5 MG tablet Take 5 mg by mouth every morning.     sitaGLIPtin (JANUVIA) 100 MG tablet Take 100 mg by mouth every morning.    acetaminophen (TYLENOL) 650 MG CR tablet Take 650 mg by mouth every 8 (eight) hours as needed. Take two tablets by mouth as needed       Follow-up Information   Follow up with BOUSKA,DAVID E, MD. Schedule an appointment as soon as possible for a visit in 1 day. (to discuss referral to cardiology for testing and to go over ECHO report.)    Contact information:   [id number] [place] [address] [phone]       TOTAL DISCHARGE TIME: 35 mins  [facility] Pager [phone]  09/11/2012, 11:06 AM

## 2012-09-11 NOTE — Progress Notes (Signed)
He is taken to his vehicle in no distress after receiving and signing for receipt of d/c instructions.  He had ambulated without difficulty before d/c, and Dr. [place] Pink notified of this--He states it is ok to d/c pt.

## 2012-09-11 NOTE — Progress Notes (Signed)
  Echocardiogram 2D Echocardiogram has been performed.  Michael [name] 09/11/2012, 11:32 AM

## 2012-09-20 DIAGNOSIS — I517 Cardiomegaly: Secondary | ICD-10-CM | POA: Diagnosis not present

## 2012-09-20 DIAGNOSIS — I10 Essential (primary) hypertension: Secondary | ICD-10-CM | POA: Diagnosis not present

## 2012-09-20 DIAGNOSIS — I6529 Occlusion and stenosis of unspecified carotid artery: Secondary | ICD-10-CM | POA: Diagnosis not present

## 2012-09-20 DIAGNOSIS — R51 Headache: Secondary | ICD-10-CM | POA: Diagnosis not present

## 2012-09-22 DIAGNOSIS — H348192 Central retinal vein occlusion, unspecified eye, stable: Secondary | ICD-10-CM | POA: Diagnosis not present

## 2012-09-22 DIAGNOSIS — H3581 Retinal edema: Secondary | ICD-10-CM | POA: Diagnosis not present

## 2012-10-20 DIAGNOSIS — H43819 Vitreous degeneration, unspecified eye: Secondary | ICD-10-CM | POA: Diagnosis not present

## 2012-10-20 DIAGNOSIS — E119 Type 2 diabetes mellitus without complications: Secondary | ICD-10-CM | POA: Diagnosis not present

## 2012-10-20 DIAGNOSIS — H348192 Central retinal vein occlusion, unspecified eye, stable: Secondary | ICD-10-CM | POA: Diagnosis not present

## 2012-10-20 DIAGNOSIS — H3581 Retinal edema: Secondary | ICD-10-CM | POA: Diagnosis not present

## 2012-10-25 DIAGNOSIS — R6889 Other general symptoms and signs: Secondary | ICD-10-CM | POA: Diagnosis not present

## 2012-10-25 DIAGNOSIS — L408 Other psoriasis: Secondary | ICD-10-CM | POA: Diagnosis not present

## 2012-10-25 DIAGNOSIS — Z79899 Other long term (current) drug therapy: Secondary | ICD-10-CM | POA: Diagnosis not present

## 2012-10-26 DIAGNOSIS — M25549 Pain in joints of unspecified hand: Secondary | ICD-10-CM | POA: Diagnosis not present

## 2012-10-26 DIAGNOSIS — D529 Folate deficiency anemia, unspecified: Secondary | ICD-10-CM | POA: Diagnosis not present

## 2012-10-26 DIAGNOSIS — L4050 Arthropathic psoriasis, unspecified: Secondary | ICD-10-CM | POA: Diagnosis not present

## 2012-10-26 DIAGNOSIS — D709 Neutropenia, unspecified: Secondary | ICD-10-CM | POA: Diagnosis not present

## 2012-11-15 DIAGNOSIS — Z79899 Other long term (current) drug therapy: Secondary | ICD-10-CM | POA: Diagnosis not present

## 2012-11-15 DIAGNOSIS — R6889 Other general symptoms and signs: Secondary | ICD-10-CM | POA: Diagnosis not present

## 2012-11-15 DIAGNOSIS — L408 Other psoriasis: Secondary | ICD-10-CM | POA: Diagnosis not present

## 2012-11-16 DIAGNOSIS — D529 Folate deficiency anemia, unspecified: Secondary | ICD-10-CM | POA: Diagnosis not present

## 2012-11-16 DIAGNOSIS — Z79899 Other long term (current) drug therapy: Secondary | ICD-10-CM | POA: Diagnosis not present

## 2012-11-16 DIAGNOSIS — L4050 Arthropathic psoriasis, unspecified: Secondary | ICD-10-CM | POA: Diagnosis not present

## 2012-12-01 DIAGNOSIS — H43819 Vitreous degeneration, unspecified eye: Secondary | ICD-10-CM | POA: Diagnosis not present

## 2012-12-01 DIAGNOSIS — H3581 Retinal edema: Secondary | ICD-10-CM | POA: Diagnosis not present

## 2012-12-01 DIAGNOSIS — E119 Type 2 diabetes mellitus without complications: Secondary | ICD-10-CM | POA: Diagnosis not present

## 2012-12-01 DIAGNOSIS — H348192 Central retinal vein occlusion, unspecified eye, stable: Secondary | ICD-10-CM | POA: Diagnosis not present

## 2012-12-08 ENCOUNTER — Encounter: Payer: Self-pay | Admitting: Internal Medicine

## 2012-12-08 ENCOUNTER — Ambulatory Visit (INDEPENDENT_AMBULATORY_CARE_PROVIDER_SITE_OTHER): Payer: Medicare Other | Admitting: Internal Medicine

## 2012-12-08 ENCOUNTER — Other Ambulatory Visit (INDEPENDENT_AMBULATORY_CARE_PROVIDER_SITE_OTHER): Payer: Medicare Other

## 2012-12-08 VITALS — BP 138/68 | HR 84 | Temp 98.6°F | Ht 71.0 in | Wt 250.0 lb

## 2012-12-08 DIAGNOSIS — I10 Essential (primary) hypertension: Secondary | ICD-10-CM

## 2012-12-08 DIAGNOSIS — E669 Obesity, unspecified: Secondary | ICD-10-CM | POA: Diagnosis not present

## 2012-12-08 DIAGNOSIS — E119 Type 2 diabetes mellitus without complications: Secondary | ICD-10-CM

## 2012-12-08 DIAGNOSIS — E039 Hypothyroidism, unspecified: Secondary | ICD-10-CM

## 2012-12-08 DIAGNOSIS — E785 Hyperlipidemia, unspecified: Secondary | ICD-10-CM | POA: Diagnosis not present

## 2012-12-08 DIAGNOSIS — Z125 Encounter for screening for malignant neoplasm of prostate: Secondary | ICD-10-CM

## 2012-12-08 DIAGNOSIS — D638 Anemia in other chronic diseases classified elsewhere: Secondary | ICD-10-CM | POA: Diagnosis not present

## 2012-12-08 LAB — HEPATIC FUNCTION PANEL
ALT: 20 U/L (ref 0–53)
Bilirubin, Direct: 0 mg/dL (ref 0.0–0.3)
Total Bilirubin: 0.7 mg/dL (ref 0.3–1.2)

## 2012-12-08 LAB — CBC
HCT: 28.3 % — ABNORMAL LOW (ref 39.0–52.0)
Hemoglobin: 10.1 g/dL — ABNORMAL LOW (ref 13.0–17.0)
Platelets: 196 10*3/uL (ref 150.0–400.0)
RDW: 16.3 % — ABNORMAL HIGH (ref 11.5–14.6)
WBC: 2.8 10*3/uL — ABNORMAL LOW (ref 4.5–10.5)

## 2012-12-08 LAB — LDL CHOLESTEROL, DIRECT: Direct LDL: 52.8 mg/dL

## 2012-12-08 LAB — LIPID PANEL: Total CHOL/HDL Ratio: 5

## 2012-12-08 LAB — BASIC METABOLIC PANEL
BUN: 26 mg/dL — ABNORMAL HIGH (ref 6–23)
CO2: 29 mEq/L (ref 19–32)
Calcium: 8.8 mg/dL (ref 8.4–10.5)
Chloride: 106 mEq/L (ref 96–112)
Creatinine, Ser: 1 mg/dL (ref 0.4–1.5)
Glucose, Bld: 141 mg/dL — ABNORMAL HIGH (ref 70–99)

## 2012-12-08 LAB — PSA, MEDICARE: PSA: 0.4 ng/ml (ref 0.10–4.00)

## 2012-12-08 LAB — HEMOGLOBIN A1C: Hgb A1c MFr Bld: 5.6 % (ref 4.6–6.5)

## 2012-12-08 LAB — TSH: TSH: 1.9 u[IU]/mL (ref 0.35–5.50)

## 2012-12-08 NOTE — Progress Notes (Signed)
HPI  Pt presents to the clinic today to establish care. He is transferring care from Dr. Coletta Memos. He does have multiple chronic medical conditions. He does have high blood pressure, but does not monitor his blood pressure at home. He does have DM 2. His sugars range from 130-190. He is non compliant with his diet. He has no concerns today.  Flu: no Pneumovax: no Tetanus: 2011 Eye doctor: yearly Dentist: no (no teeth) Colonoscopy: 2012 (5 years)  Past Medical History  Diagnosis Date  . Diabetes mellitus   . Hypertension   . Hyperlipidemia   . Gout   . Arthritis   . Thyroid disease     hypothyroidism  . Personal history of colonic polyps 2007, 2008    adenoma each time, largest 12 mm in 2007  . Iron deficiency anemia   . AVM (arteriovenous malformation) of colon   . Allergy   . H/O transfusion of whole blood     Current Outpatient Prescriptions  Medication Sig Dispense Refill  . acetaminophen (TYLENOL) 650 MG CR tablet Take 650 mg by mouth every 8 (eight) hours as needed. Take two tablets by mouth as needed      . allopurinol (ZYLOPRIM) 100 MG tablet Take 200 mg by mouth every morning.       . ferrous sulfate 325 (65 FE) MG tablet Take 325 mg by mouth 3 (three) times daily with meals.      [name] gemfibrozil (LOPID) 600 MG tablet Take 600 mg by mouth 2 (two) times daily before a meal.      . glipiZIDE (GLUCOTROL XL) 10 MG 24 hr tablet Take 10 mg by mouth every morning.       [name] KRILL OIL 1000 MG CAPS Take 1 capsule by mouth every morning.      [name] levothyroxine (SYNTHROID, LEVOTHROID) 150 MCG tablet Take 150 mcg by mouth daily before breakfast.      . lisinopril (PRINIVIL,ZESTRIL) 20 MG tablet Take 20 mg by mouth every morning.       . metFORMIN (GLUCOPHAGE) 1000 MG tablet Take 1,000 mg by mouth 2 (two) times daily with a meal.        . niacin 500 MG CR capsule Take 1,000 mg by mouth every morning.      [name] omeprazole (PRILOSEC) 40 MG capsule Take 1 capsule (40 mg total) by mouth daily.   90 capsule  3  . pravastatin (PRAVACHOL) 10 MG tablet Take 10 mg by mouth at bedtime.      . predniSONE (DELTASONE) 5 MG tablet Take 5 mg by mouth every morning.        No current facility-administered medications for this visit.    Allergies  Allergen Reactions  . Percocet (Oxycodone-Acetaminophen) Nausea And Vomiting    Family History  Problem Relation Age of Onset  . Malignant hyperthermia Neg Hx   . Heart attack Father   . Lung cancer Father   . Diabetes Father   . Stroke Father   . Hypertension Father   . Stroke Mother   . Hypertension Mother     History   Social History  . Marital Status: Married    Spouse Name: N/A    Number of Children: N/A  . Years of Education: 12   Occupational History  .  Retired    Social History Main Topics  . Smoking status: Former Smoker    Quit date: 03/29/2006  . Smokeless tobacco: Never Used  . Alcohol Use: 0.6 oz/week  1 Cans of beer per week     Comment: occasional alcohol intake  . Drug Use: No  . Sexually Active: Not Currently   Other Topics Concern  . Not on file   Social History Narrative   Regular exercise-no   Caffeine Use-yes    ROS:  Constitutional: Denies fever, malaise, fatigue, headache or abrupt weight changes.  HEENT: Denies eye pain, eye redness, ear pain, ringing in the ears, wax buildup, runny nose, nasal congestion, bloody nose, or sore throat. Respiratory: Denies difficulty breathing, shortness of breath, cough or sputum production.   Cardiovascular: Denies chest pain, chest tightness, palpitations or swelling in the hands or feet.  Gastrointestinal: Denies urgency, pain with urination, blood in urine, odor or discharge. Musculoskeletal: Pt reports intermittent diarrhea. Denies abdominal pain, bloating, constipation, or blood in the stool.  GU: Denies frequency, s decrease in range of motion, difficulty with gait, muscle pain or joint pain and swelling.  Skin: Pt reports psoriasis of left arm  Denies redness, or ulcercations.  Neurological: Denies dizziness, difficulty with memory, difficulty with speech or problems with balance and coordination.   No other specific complaints in a complete review of systems (except as listed in HPI above).  PE:  BP 138/68  Pulse 84  Temp(Src) 98.6 F (37 C) (Oral)  Ht 5\' 11"  (1.803 m)  Wt 250 lb (113.399 kg)  BMI 34.88 kg/m2  SpO2 96% Wt Readings from Last 3 Encounters:  12/08/12 250 lb (113.399 kg)  09/10/12 261 lb (118.389 kg)  08/17/12 251 lb 3.2 oz (113.944 kg)    General: Appears his stated age, obese but well developed, well nourished in NAD. HEENT: Head: normal shape and size; Eyes: sclera white, no icterus, conjunctiva pink, PERRLA and EOMs intact; Ears: Tm's gray and intact, normal light reflex; Nose: mucosa pink and moist, septum midline; Throat/Mouth: Teeth present, mucosa pink and moist, no lesions or ulcerations noted.  Neck: Normal range of motion. Neck supple, trachea midline. No massses, lumps or thyromegaly present.  Cardiovascular: Normal rate and rhythm. S1,S2 noted. Aortic murmer noted. No rubs or gallops noted. No JVD or BLE edema. No carotid bruits noted. Pulmonary/Chest: Normal effort and positive vesicular breath sounds. No respiratory distress. No wheezes, rales or ronchi noted.  Abdomen: Soft and nontender. Normal bowel sounds, no bruits noted. No distention or masses noted. Liver, spleen and kidneys non palpable. Musculoskeletal: Normal range of motion. No signs of joint swelling. No difficulty with gait.  Neurological: Alert and oriented. Cranial nerves II-XII intact. Coordination normal. +DTRs bilaterally. Psychiatric: Mood and affect normal. Behavior is normal. Judgment and thought content normal.      Assessment and Plan:  Preventative Health:  Pt declines all preventative vaccines today Pt encouraged to work on diet and exercise Will obtain lab work today

## 2012-12-08 NOTE — Assessment & Plan Note (Signed)
Will check CBC today Stay off aspirin at this time

## 2012-12-08 NOTE — Assessment & Plan Note (Signed)
Will check TSH today Will refill synthroid based on levels

## 2012-12-08 NOTE — Assessment & Plan Note (Signed)
Will check A1C today Will titrate meds based on level Encouraged pt to work on diet and exercise Advised pt to avoid carbs and complex sugars in his diet

## 2012-12-08 NOTE — Assessment & Plan Note (Signed)
Well controlled Continue current therapy Encouraged pt to work on diet and exercise Encouraged pt to avoid salt in his diet

## 2012-12-08 NOTE — Patient Instructions (Signed)
Health Maintenance, Males A healthy lifestyle and preventative care can promote health and wellness.  Maintain regular health, dental, and eye exams.  Eat a healthy diet. Foods like vegetables, fruits, whole grains, low-fat dairy products, and lean protein foods contain the nutrients you need without too many calories. Decrease your intake of foods high in solid fats, added sugars, and salt. Get information about a proper diet from your caregiver, if necessary.  Regular physical exercise is one of the most important things you can do for your health. Most adults should get at least 150 minutes of moderate-intensity exercise (any activity that increases your heart rate and causes you to sweat) each week. In addition, most adults need muscle-strengthening exercises on 2 or more days a week.   Maintain a healthy weight. The body mass index (BMI) is a screening tool to identify possible weight problems. It provides an estimate of body fat based on height and weight. Your caregiver can help determine your BMI, and can help you achieve or maintain a healthy weight. For adults 20 years and older:  A BMI below 18.5 is considered underweight.  A BMI of 18.5 to 24.9 is normal.  A BMI of 25 to 29.9 is considered overweight.  A BMI of 30 and above is considered obese.  Maintain normal blood lipids and cholesterol by exercising and minimizing your intake of saturated fat. Eat a balanced diet with plenty of fruits and vegetables. Blood tests for lipids and cholesterol should begin at age 20 and be repeated every 5 years. If your lipid or cholesterol levels are high, you are over 50, or you are a high risk for heart disease, you may need your cholesterol levels checked more frequently.Ongoing high lipid and cholesterol levels should be treated with medicines, if diet and exercise are not effective.  If you smoke, find out from your caregiver how to quit. If you do not use tobacco, do not start.  If you  choose to drink alcohol, do not exceed 2 drinks per day. One drink is considered to be 12 ounces (355 mL) of beer, 5 ounces (148 mL) of wine, or 1.5 ounces (44 mL) of liquor.  Avoid use of street drugs. Do not share needles with anyone. Ask for help if you need support or instructions about stopping the use of drugs.  High blood pressure causes heart disease and increases the risk of stroke. Blood pressure should be checked at least every 1 to 2 years. Ongoing high blood pressure should be treated with medicines if weight loss and exercise are not effective.  If you are 45 to 70 years old, ask your caregiver if you should take aspirin to prevent heart disease.  Diabetes screening involves taking a blood sample to check your fasting blood sugar level. This should be done once every 3 years, after age 45, if you are within normal weight and without risk factors for diabetes. Testing should be considered at a younger age or be carried out more frequently if you are overweight and have at least 1 risk factor for diabetes.  Colorectal cancer can be detected and often prevented. Most routine colorectal cancer screening begins at the age of 50 and continues through age 75. However, your caregiver may recommend screening at an earlier age if you have risk factors for colon cancer. On a yearly basis, your caregiver may provide home test kits to check for hidden blood in the stool. Use of a small camera at the end of a tube,   to directly examine the colon (sigmoidoscopy or colonoscopy), can detect the earliest forms of colorectal cancer. Talk to your caregiver about this at age 50, when routine screening begins. Direct examination of the colon should be repeated every 5 to 10 years through age 75, unless early forms of pre-cancerous polyps or small growths are found.  Hepatitis C blood testing is recommended for all people born from 1945 through 1965 and any individual with known risks for hepatitis C.  Healthy  men should no longer receive prostate-specific antigen (PSA) blood tests as part of routine cancer screening. Consult with your caregiver about prostate cancer screening.  Testicular cancer screening is not recommended for adolescents or adult males who have no symptoms. Screening includes self-exam, caregiver exam, and other screening tests. Consult with your caregiver about any symptoms you have or any concerns you have about testicular cancer.  Practice safe sex. Use condoms and avoid high-risk sexual practices to reduce the spread of sexually transmitted infections (STIs).  Use sunscreen with a sun protection factor (SPF) of 30 or greater. Apply sunscreen liberally and repeatedly throughout the day. You should seek shade when your shadow is shorter than you. Protect yourself by wearing long sleeves, pants, a wide-brimmed hat, and sunglasses year round, whenever you are outdoors.  Notify your caregiver of new moles or changes in moles, especially if there is a change in shape or color. Also notify your caregiver if a mole is larger than the size of a pencil eraser.  A one-time screening for abdominal aortic aneurysm (AAA) and surgical repair of large AAAs by sound wave imaging (ultrasonography) is recommended for ages 65 to 75 years who are current or former smokers.  Stay current with your immunizations. Document Released: 01/09/2008 Document Revised: 10/05/2011 Document Reviewed: 12/08/2010 [employer] Patient Information 2013 [employer].  

## 2012-12-08 NOTE — Assessment & Plan Note (Signed)
encouraged pt to work on diet and exercise

## 2012-12-09 ENCOUNTER — Other Ambulatory Visit: Payer: Self-pay | Admitting: Internal Medicine

## 2012-12-09 ENCOUNTER — Telehealth: Payer: Self-pay | Admitting: Oncology

## 2012-12-09 DIAGNOSIS — D709 Neutropenia, unspecified: Secondary | ICD-10-CM

## 2012-12-09 NOTE — Telephone Encounter (Signed)
S/W PT WIFE IN RE NP APPT 06/19 @ 10:30 W/DR. [place] PACKET MAILED.

## 2013-01-06 ENCOUNTER — Other Ambulatory Visit: Payer: Self-pay | Admitting: Oncology

## 2013-01-06 DIAGNOSIS — D709 Neutropenia, unspecified: Secondary | ICD-10-CM

## 2013-01-12 ENCOUNTER — Ambulatory Visit: Payer: Medicare Other

## 2013-01-12 ENCOUNTER — Other Ambulatory Visit (HOSPITAL_BASED_OUTPATIENT_CLINIC_OR_DEPARTMENT_OTHER): Payer: Medicare Other | Admitting: Lab

## 2013-01-12 ENCOUNTER — Telehealth: Payer: Self-pay | Admitting: Oncology

## 2013-01-12 ENCOUNTER — Encounter: Payer: Self-pay | Admitting: Oncology

## 2013-01-12 ENCOUNTER — Ambulatory Visit (HOSPITAL_BASED_OUTPATIENT_CLINIC_OR_DEPARTMENT_OTHER): Payer: Medicare Other | Admitting: Oncology

## 2013-01-12 VITALS — BP 132/79 | HR 80 | Temp 97.8°F | Resp 18 | Ht 71.0 in | Wt 248.8 lb

## 2013-01-12 DIAGNOSIS — D709 Neutropenia, unspecified: Secondary | ICD-10-CM

## 2013-01-12 DIAGNOSIS — D500 Iron deficiency anemia secondary to blood loss (chronic): Secondary | ICD-10-CM | POA: Diagnosis not present

## 2013-01-12 LAB — COMPREHENSIVE METABOLIC PANEL (CC13)
Albumin: 3.8 g/dL (ref 3.5–5.0)
CO2: 25 mEq/L (ref 22–29)
Chloride: 104 mEq/L (ref 98–107)
Glucose: 231 mg/dl — ABNORMAL HIGH (ref 70–99)
Potassium: 4.3 mEq/L (ref 3.5–5.1)
Sodium: 138 mEq/L (ref 136–145)
Total Protein: 7.1 g/dL (ref 6.4–8.3)

## 2013-01-12 LAB — CBC WITH DIFFERENTIAL/PLATELET
Eosinophils Absolute: 0.1 10*3/uL (ref 0.0–0.5)
MONO#: 0.3 10*3/uL (ref 0.1–0.9)
NEUT#: 2.5 10*3/uL (ref 1.5–6.5)
Platelets: 185 10*3/uL (ref 140–400)
RBC: 3.33 10*6/uL — ABNORMAL LOW (ref 4.20–5.82)
RDW: 18.1 % — ABNORMAL HIGH (ref 11.0–14.6)
WBC: 3.5 10*3/uL — ABNORMAL LOW (ref 4.0–10.3)

## 2013-01-12 LAB — CHCC SMEAR

## 2013-01-12 NOTE — Progress Notes (Signed)
Checked in new patient. No financial issues. Didn't ask about POA/living will. They want communication phone and mail only.

## 2013-01-12 NOTE — Telephone Encounter (Signed)
Gave pt appt for lab and MD December 2014

## 2013-01-12 NOTE — Progress Notes (Signed)
Reason for Referral: Leukocytopenia and anemia.   HPI: Michael [name]  Is a pleasant 72 year old gentleman native of [place] where he lived the majority of his life. He is a gentleman with past medical history significant for diabetes and what appears to be autoimmune arthritis. He has been on methotrexate for his arthritis but he could not tolerate it and currently he is on prednisone for that. He also has a history of AVM of the colon and have had recurrent GI bleeds at times. He was referred to me for the evaluation or fluctuating total white cell count. His most recent CBC on 12/08/2012 showed his white cell count total of 2.8 but no differential was noted. On 09/11/2012 his total white cell count was 3.7 with the normal range of 4.0. His white cell count fluctuated as normal as 4.5 and 2011 down to 3.5 in May of 2013 to normal again in June of 2013 the total white cell count 5.8. His differential was always been normal with a with percentage of neutrophils within normal range.  Clinically, Michael [name] is asymptomatic from this. He does not report any recurrent sinopulmonary infections. He does not report cellulitis or rhonchi this or recurrent systemic infections. He had not had any weight loss or decline in his energy or performance status is continued to perform activities of daily living without any hindrance or decline. He does report that prednisone helps with his arthritis mostly in his hands.   Past Medical History  Diagnosis Date  . Diabetes mellitus   . Hypertension   . Hyperlipidemia   . Gout   . Arthritis   . Thyroid disease     hypothyroidism  . Personal history of colonic polyps 2007, 2008    adenoma each time, largest 12 mm in 2007  . Iron deficiency anemia   . AVM (arteriovenous malformation) of colon   . Allergy   . H/O transfusion of whole blood   :  Past Surgical History  Procedure Laterality Date  . Rotator cuff repair      left  . Knee arthroscopy      right  .  Colonoscopy w/ polypectomy  04/09/2006    12 mm adenoma  . Colonoscopy w/ polypectomy  06/17/2007    5 mm adenoma  . Colonoscopy  02/10/2011    internal hemorrhoids  . Esophagogastroduodenoscopy  12/23/2011    Procedure: ESOPHAGOGASTRODUODENOSCOPY (EGD);  Surgeon: Irene Shipper, MD;  Location: [facility];  Service: Endoscopy;  Laterality: N/A;  with small bowel bx's  . Givens capsule study  12/28/2011  :   Current Outpatient Prescriptions  Medication Sig Dispense Refill  . acetaminophen (TYLENOL) 650 MG CR tablet Take 650 mg by mouth every 8 (eight) hours as needed. Take two tablets by mouth as needed      . allopurinol (ZYLOPRIM) 100 MG tablet Take 200 mg by mouth every morning.       . ferrous sulfate 325 (65 FE) MG tablet Take 325 mg by mouth 3 (three) times daily with meals.      [name] gemfibrozil (LOPID) 600 MG tablet Take 600 mg by mouth 2 (two) times daily before a meal.      . glipiZIDE (GLUCOTROL XL) 10 MG 24 hr tablet Take 10 mg by mouth every morning.       [name] KRILL OIL 1000 MG CAPS Take 1 capsule by mouth every morning.      [name] levothyroxine (SYNTHROID, LEVOTHROID) 150 MCG tablet Take 150 mcg by  mouth daily before breakfast.      . lisinopril (PRINIVIL,ZESTRIL) 20 MG tablet Take 20 mg by mouth every morning.       . metFORMIN (GLUCOPHAGE) 1000 MG tablet Take 1,000 mg by mouth 2 (two) times daily with a meal.        . methotrexate (RHEUMATREX) 2.5 MG tablet Take 2.5 mg by mouth daily.      . niacin 500 MG CR capsule Take 1,000 mg by mouth every morning.      . pravastatin (PRAVACHOL) 10 MG tablet Take 10 mg by mouth at bedtime.      . predniSONE (DELTASONE) 5 MG tablet Take 5 mg by mouth every morning.       [name] omeprazole (PRILOSEC) 40 MG capsule Take 1 capsule (40 mg total) by mouth daily.  90 capsule  3   No current facility-administered medications for this visit.      Allergies  Allergen Reactions  . Percocet (Oxycodone-Acetaminophen) Nausea And Vomiting  :  Family History   Problem Relation Age of Onset  . Malignant hyperthermia Neg Hx   . Heart attack Father   . Lung cancer Father   . Diabetes Father   . Stroke Father   . Hypertension Father   . Stroke Mother   . Hypertension Mother   :  History   Social History  . Marital Status: Married    Spouse Name: N/A    Number of Children: N/A  . Years of Education: 12   Occupational History  .  Retired    Social History Main Topics  . Smoking status: Former Smoker    Quit date: 03/29/2006  . Smokeless tobacco: Never Used  . Alcohol Use: 0.6 oz/week    1 Cans of beer per week     Comment: occasional alcohol intake  . Drug Use: No  . Sexually Active: Not Currently   Other Topics Concern  . Not on file   Social History Narrative   Regular exercise-no   Caffeine Use-yes  :  A comprehensive review of systems was negative.  Exam: ECOG 0 Blood pressure 132/79, pulse 80, temperature 97.8 F (36.6 C), temperature source Oral, resp. rate 18, height 5\' 11"  (1.803 m), weight 248 lb 12.8 oz (112.855 kg), SpO2 99.00%. General appearance: alert and cooperative Head: Normocephalic, without obvious abnormality, atraumatic Eyes: conjunctivae/corneas clear. PERRL, EOM's intact. Fundi benign. Throat: lips, mucosa, and tongue normal; teeth and gums normal Neck: no adenopathy, no carotid bruit, no JVD, supple, symmetrical, trachea midline and thyroid not enlarged, symmetric, no tenderness/mass/nodules Back: symmetric, no curvature. ROM normal. No CVA tenderness. Resp: clear to auscultation bilaterally Chest wall: no tenderness Cardio: regular rate and rhythm, S1, S2 normal, no murmur, click, rub or gallop GI: soft, non-tender; bowel sounds normal; no masses,  no organomegaly Extremities: extremities normal, atraumatic, no cyanosis or edema Skin: Skin color, texture, turgor normal. No rashes or lesions Lymph nodes: Cervical, supraclavicular, and axillary nodes normal.   Recent Labs  01/12/13 1053  WBC  3.5*  HGB 11.0*  HCT 30.8*  PLT 185      Blood smear review: The smear was personally reviewed today and showed mild microcytosis and hypochromia. There is no schistocytes or fragments. The white cells appeared normal without any evidence of dysplasia or immaturity blasts.    Assessment and Plan:   71 year old gentleman with the following issues:  1. Leukocytopenia: His differential today appears normal with a total right cell count of 3.5 which is  close to the normal range of 3.9. His neutrophil percentage is 72% with an absolute neutrophil count of 2.5 all within normal range. The etiology of his fluctuating total right cell count most likely related to an out of a meal and phenomenon versus reactive benign leukopenia. He does have what appears to be autoimmune arthritis and certainly manifestation of autoimmune disease can cause mild leukocytopenia. Other conditions to be considered would include myelodysplastic syndrome or lymphoproliferative disorders are unlikely at this point. From a management standpoint, really no any further intervention is needed other than observation and surveillance and I will repeat his blood counts in about 6 months to confirm the presence of a normal differential. If any abnormalities noted on his differential with the next check we will consider further evaluation with peripheral blood flow cytometry and less likely a bone marrow biopsy.  2. Mild anemia and elevated RDW. This is most likely related to iron deficiency anemia and chronic blood loss from his AVM. He is on iron supplements at this point which seems to be adequate.  All his questions was answered today and he'll followup in 6 months.

## 2013-01-16 DIAGNOSIS — Z79899 Other long term (current) drug therapy: Secondary | ICD-10-CM | POA: Diagnosis not present

## 2013-01-16 DIAGNOSIS — L408 Other psoriasis: Secondary | ICD-10-CM | POA: Diagnosis not present

## 2013-01-16 DIAGNOSIS — R6889 Other general symptoms and signs: Secondary | ICD-10-CM | POA: Diagnosis not present

## 2013-01-17 DIAGNOSIS — Z79899 Other long term (current) drug therapy: Secondary | ICD-10-CM | POA: Diagnosis not present

## 2013-01-17 DIAGNOSIS — D529 Folate deficiency anemia, unspecified: Secondary | ICD-10-CM | POA: Diagnosis not present

## 2013-01-17 DIAGNOSIS — L4050 Arthropathic psoriasis, unspecified: Secondary | ICD-10-CM | POA: Diagnosis not present

## 2013-01-30 DIAGNOSIS — E119 Type 2 diabetes mellitus without complications: Secondary | ICD-10-CM | POA: Diagnosis not present

## 2013-01-30 DIAGNOSIS — H43819 Vitreous degeneration, unspecified eye: Secondary | ICD-10-CM | POA: Diagnosis not present

## 2013-01-30 DIAGNOSIS — H3581 Retinal edema: Secondary | ICD-10-CM | POA: Diagnosis not present

## 2013-01-30 DIAGNOSIS — H348192 Central retinal vein occlusion, unspecified eye, stable: Secondary | ICD-10-CM | POA: Diagnosis not present

## 2013-03-10 ENCOUNTER — Ambulatory Visit: Payer: Medicare Other | Admitting: Internal Medicine

## 2013-03-20 DIAGNOSIS — H3581 Retinal edema: Secondary | ICD-10-CM | POA: Diagnosis not present

## 2013-03-20 DIAGNOSIS — H348192 Central retinal vein occlusion, unspecified eye, stable: Secondary | ICD-10-CM | POA: Diagnosis not present

## 2013-04-11 DIAGNOSIS — Z79899 Other long term (current) drug therapy: Secondary | ICD-10-CM | POA: Diagnosis not present

## 2013-04-11 DIAGNOSIS — D529 Folate deficiency anemia, unspecified: Secondary | ICD-10-CM | POA: Diagnosis not present

## 2013-04-11 DIAGNOSIS — L4050 Arthropathic psoriasis, unspecified: Secondary | ICD-10-CM | POA: Diagnosis not present

## 2013-05-16 ENCOUNTER — Ambulatory Visit (INDEPENDENT_AMBULATORY_CARE_PROVIDER_SITE_OTHER): Payer: Medicare Other | Admitting: Internal Medicine

## 2013-05-16 ENCOUNTER — Other Ambulatory Visit (INDEPENDENT_AMBULATORY_CARE_PROVIDER_SITE_OTHER): Payer: Medicare Other

## 2013-05-16 ENCOUNTER — Encounter: Payer: Self-pay | Admitting: Internal Medicine

## 2013-05-16 VITALS — BP 142/80 | HR 68 | Ht 71.0 in | Wt 250.2 lb

## 2013-05-16 DIAGNOSIS — E079 Disorder of thyroid, unspecified: Secondary | ICD-10-CM

## 2013-05-16 DIAGNOSIS — E119 Type 2 diabetes mellitus without complications: Secondary | ICD-10-CM | POA: Diagnosis not present

## 2013-05-16 DIAGNOSIS — E781 Pure hyperglyceridemia: Secondary | ICD-10-CM | POA: Diagnosis not present

## 2013-05-16 DIAGNOSIS — I10 Essential (primary) hypertension: Secondary | ICD-10-CM

## 2013-05-16 DIAGNOSIS — E669 Obesity, unspecified: Secondary | ICD-10-CM

## 2013-05-16 DIAGNOSIS — M1990 Unspecified osteoarthritis, unspecified site: Secondary | ICD-10-CM

## 2013-05-16 DIAGNOSIS — M109 Gout, unspecified: Secondary | ICD-10-CM

## 2013-05-16 DIAGNOSIS — M129 Arthropathy, unspecified: Secondary | ICD-10-CM

## 2013-05-16 LAB — MICROALBUMIN / CREATININE URINE RATIO
Creatinine,U: 109.1 mg/dL
Microalb Creat Ratio: 1.1 mg/g (ref 0.0–30.0)
Microalb, Ur: 1.2 mg/dL (ref 0.0–1.9)

## 2013-05-16 LAB — LIPID PANEL
Cholesterol: 131 mg/dL (ref 0–200)
Total CHOL/HDL Ratio: 4

## 2013-05-16 NOTE — Assessment & Plan Note (Signed)
TSH normal

## 2013-05-16 NOTE — Assessment & Plan Note (Signed)
No issues Continue allopurinol

## 2013-05-16 NOTE — Assessment & Plan Note (Signed)
Follows with Dr. Tanda Rockers Continue current medications

## 2013-05-16 NOTE — Assessment & Plan Note (Signed)
On lopid and pravastatin Will recheck lipid profile today

## 2013-05-16 NOTE — Assessment & Plan Note (Signed)
Slightly elevated today Continue lisiniprol-consider increase if continues to remain elevated

## 2013-05-16 NOTE — Assessment & Plan Note (Signed)
Weight stable Try to incoporate aerobic exercise into your regimen

## 2013-05-16 NOTE — Assessment & Plan Note (Signed)
Will recheck A1C and microalbumin today Pt not checking his blood sugars routinely Foot exam today Pt declines preventative vaccines

## 2013-05-16 NOTE — Progress Notes (Signed)
Subjective:    Patient ID: Michael [name], male    DOB: 03/17/1941, 72 y.o.   MRN: [id number]  HPI  Pt presents to the clinic today for 6 month follow up of chronic medical conditions.  DM2: sugars range 150-200. He is on metformin and glipizide. He denies hypoglycemic events.  Hypothyroidism: TSH last check in 11/2012. On synthroid- no issues.  HTN: on lisinopril. Mildly elevated today but pt reports he is very nervous when he comes to the clinic.  Gout: no recent flares on allopurinol.  Hyperlipidemia: on pravastatin and lopid. Triglycerides very elevated at last visit. He has been trying to work on diet and exercise.  Arthritis: on prednisone and rheumetrex. Follows with Dr. Tanda Rockers.  Review of Systems      Past Medical History  Diagnosis Date  . Diabetes mellitus   . Hypertension   . Hyperlipidemia   . Gout   . Arthritis   . Thyroid disease     hypothyroidism  . Personal history of colonic polyps 2007, 2008    adenoma each time, largest 12 mm in 2007  . Iron deficiency anemia   . AVM (arteriovenous malformation) of colon   . Allergy   . H/O transfusion of whole blood     Current Outpatient Prescriptions  Medication Sig Dispense Refill  . acetaminophen (TYLENOL) 650 MG CR tablet Take 650 mg by mouth every 8 (eight) hours as needed. Take two tablets by mouth as needed      . allopurinol (ZYLOPRIM) 100 MG tablet Take 200 mg by mouth every morning.       . ferrous sulfate 325 (65 FE) MG tablet Take 325 mg by mouth 3 (three) times daily with meals.      [name] gemfibrozil (LOPID) 600 MG tablet Take 600 mg by mouth 2 (two) times daily before a meal.      . glipiZIDE (GLUCOTROL XL) 10 MG 24 hr tablet Take 10 mg by mouth every morning.       [name] KRILL OIL 1000 MG CAPS Take 1 capsule by mouth every morning.      [name] levothyroxine (SYNTHROID, LEVOTHROID) 150 MCG tablet Take 150 mcg by mouth daily before breakfast.      . lisinopril (PRINIVIL,ZESTRIL) 20 MG tablet Take 20 mg by mouth  every morning.       . metFORMIN (GLUCOPHAGE) 1000 MG tablet Take 1,000 mg by mouth 2 (two) times daily with a meal.        . methotrexate (RHEUMATREX) 2.5 MG tablet Take 2.5 mg by mouth daily.      . niacin 500 MG CR capsule Take 1,000 mg by mouth every morning.      [name] omeprazole (PRILOSEC) 40 MG capsule Take 1 capsule (40 mg total) by mouth daily.  90 capsule  3  . pravastatin (PRAVACHOL) 10 MG tablet Take 10 mg by mouth at bedtime.      . predniSONE (DELTASONE) 5 MG tablet Take 5 mg by mouth every morning.        No current facility-administered medications for this visit.    Allergies  Allergen Reactions  . Percocet [Oxycodone-Acetaminophen] Nausea And Vomiting    Family History  Problem Relation Age of Onset  . Malignant hyperthermia Neg Hx   . Heart attack Father   . Lung cancer Father   . Diabetes Father   . Stroke Father   . Hypertension Father   . Stroke Mother   . Hypertension Mother  History   Social History  . Marital Status: Married    Spouse Name: N/A    Number of Children: N/A  . Years of Education: 12   Occupational History  .  Retired    Social History Main Topics  . Smoking status: Former Smoker    Quit date: 03/29/2006  . Smokeless tobacco: Never Used  . Alcohol Use: 0.6 oz/week    1 Cans of beer per week     Comment: occasional alcohol intake  . Drug Use: No  . Sexual Activity: Not Currently   Other Topics Concern  . Not on file   Social History Narrative   Regular exercise-no   Caffeine Use-yes     Constitutional: Denies fever, malaise, fatigue, headache or abrupt weight changes.  Respiratory: Denies difficulty breathing, shortness of breath, cough or sputum production.   Cardiovascular: Denies chest pain, chest tightness, palpitations or swelling in the hands or feet.  Gastrointestinal: Denies abdominal pain, bloating, constipation, diarrhea or blood in the stool.  Musculoskeletal: Pt reports joint pain in hands. Denies decrease  in range of motion, difficulty with gait, muscle pain or joint swelling.  Skin: Denies redness, rashes, lesions or ulcercations.  Neurological: Denies dizziness, difficulty with memory, difficulty with speech or problems with balance and coordination.   No other specific complaints in a complete review of systems (except as listed in HPI above).  Objective:   Physical Exam   BP 142/80  Pulse 68  Ht 5\' 11"  (1.803 m)  Wt 250 lb 4 oz (113.513 kg)  BMI 34.92 kg/m2  SpO2 98% Wt Readings from Last 3 Encounters:  05/16/13 250 lb 4 oz (113.513 kg)  01/12/13 248 lb 12.8 oz (112.855 kg)  12/08/12 250 lb (113.399 kg)    General: Appears his stated age, obese but well developed, well nourished in NAD. Cardiovascular: Normal rate and rhythm. S1,S2 noted.  No murmur, rubs or gallops noted. No JVD or BLE edema. No carotid bruits noted. Pulmonary/Chest: Normal effort and positive vesicular breath sounds. No respiratory distress. No wheezes, rales or ronchi noted.  Abdomen: Soft and nontender. Normal bowel sounds, no bruits noted. No distention or masses noted. Liver, spleen and kidneys non palpable. Musculoskeletal: Normal range of motion. No signs of joint swelling. No difficulty with gait.  Neurological: Alert and oriented. Cranial nerves II-XII intact. Coordination normal. +DTRs bilaterally.  BMET    Component Value Date/Time   NA 138 01/12/2013 1053   NA 140 12/08/2012 1049   K 4.3 01/12/2013 1053   K 4.4 12/08/2012 1049   CL 104 01/12/2013 1053   CL 106 12/08/2012 1049   CO2 25 01/12/2013 1053   CO2 29 12/08/2012 1049   GLUCOSE 231* 01/12/2013 1053   GLUCOSE 141* 12/08/2012 1049   BUN 23.2 01/12/2013 1053   BUN 26* 12/08/2012 1049   CREATININE 1.1 01/12/2013 1053   CREATININE 1.0 12/08/2012 1049   CALCIUM 9.3 01/12/2013 1053   CALCIUM 8.8 12/08/2012 1049   GFRNONAA 70* 09/11/2012 0053   GFRAA 81* 09/11/2012 0053    Lipid Panel     Component Value Date/Time   CHOL 138 12/08/2012 1049   TRIG  514.0 Triglyceride is over 400; calculations on Lipids are invalid.* 12/08/2012 1049   HDL 26.90* 12/08/2012 1049   CHOLHDL 5 12/08/2012 1049   VLDL 102.8* 12/08/2012 1049    CBC    Component Value Date/Time   WBC 3.5* 01/12/2013 1053   WBC 2.8* 12/08/2012 1049   RBC 3.33*  01/12/2013 1053   RBC 3.14* 12/08/2012 1049   RBC 2.81* 12/22/2011 0502   HGB 11.0* 01/12/2013 1053   HGB 10.1* 12/08/2012 1049   HCT 30.8* 01/12/2013 1053   HCT 28.3* 12/08/2012 1049   PLT 185 01/12/2013 1053   PLT 196.0 12/08/2012 1049   MCV 92.5 01/12/2013 1053   MCV 90.3 12/08/2012 1049   MCH 33.1 01/12/2013 1053   MCH 31.9 09/11/2012 0053   MCHC 35.8 01/12/2013 1053   MCHC 35.5 12/08/2012 1049   RDW 18.1* 01/12/2013 1053   RDW 16.3* 12/08/2012 1049   LYMPHSABS 0.5* 01/12/2013 1053   LYMPHSABS 0.5* 08/17/2012 1124   MONOABS 0.3 01/12/2013 1053   MONOABS 0.3 08/17/2012 1124   EOSABS 0.1 01/12/2013 1053   EOSABS 0.1 08/17/2012 1124   BASOSABS 0.0 01/12/2013 1053   BASOSABS 0.0 08/17/2012 1124    Hgb A1C Lab Results  Component Value Date   HGBA1C 5.6 12/08/2012        Assessment & Plan:

## 2013-05-16 NOTE — Patient Instructions (Signed)
Hypertriglyceridemia  Diet for High blood levels of Triglycerides Most fats in food are triglycerides. Triglycerides in your blood are stored as fat in your body. High levels of triglycerides in your blood may put you at a greater risk for heart disease and stroke.  Normal triglyceride levels are less than 150 mg/dL. Borderline high levels are 150-199 mg/dl. High levels are 200 - 499 mg/dL, and very high triglyceride levels are greater than 500 mg/dL. The decision to treat high triglycerides is generally based on the level. For people with borderline or high triglyceride levels, treatment includes weight loss and exercise. Drugs are recommended for people with very high triglyceride levels. Many people who need treatment for high triglyceride levels have metabolic syndrome. This syndrome is a collection of disorders that often include: insulin resistance, high blood pressure, blood clotting problems, high cholesterol and triglycerides. TESTING PROCEDURE FOR TRIGLYCERIDES  You should not eat 4 hours before getting your triglycerides measured. The normal range of triglycerides is between 10 and 250 milligrams per deciliter (mg/dl). Some people may have extreme levels (1000 or above), but your triglyceride level may be too high if it is above 150 mg/dl, depending on what other risk factors you have for heart disease.  People with high blood triglycerides may also have high blood cholesterol levels. If you have high blood cholesterol as well as high blood triglycerides, your risk for heart disease is probably greater than if you only had high triglycerides. High blood cholesterol is one of the main risk factors for heart disease. CHANGING YOUR DIET  Your weight can affect your blood triglyceride level. If you are more than 20% above your ideal body weight, you may be able to lower your blood triglycerides by losing weight. Eating less and exercising regularly is the best way to combat this. Fat provides more  calories than any other food. The best way to lose weight is to eat less fat. Only 30% of your total calories should come from fat. Less than 7% of your diet should come from saturated fat. A diet low in fat and saturated fat is the same as a diet to decrease blood cholesterol. By eating a diet lower in fat, you may lose weight, lower your blood cholesterol, and lower your blood triglyceride level.  Eating a diet low in fat, especially saturated fat, may also help you lower your blood triglyceride level. Ask your dietitian to help you figure how much fat you can eat based on the number of calories your caregiver has prescribed for you.  Exercise, in addition to helping with weight loss may also help lower triglyceride levels.   Alcohol can increase blood triglycerides. You may need to stop drinking alcoholic beverages.  Too much carbohydrate in your diet may also increase your blood triglycerides. Some complex carbohydrates are necessary in your diet. These may include bread, rice, potatoes, other starchy vegetables and cereals.  Reduce "simple" carbohydrates. These may include pure sugars, candy, honey, and jelly without losing other nutrients. If you have the kind of high blood triglycerides that is affected by the amount of carbohydrates in your diet, you will need to eat less sugar and less high-sugar foods. Your caregiver can help you with this.  Adding 2-4 grams of fish oil (EPA+ DHA) may also help lower triglycerides. Speak with your caregiver before adding any supplements to your regimen. Following the Diet  Maintain your ideal weight. Your caregivers can help you with a diet. Generally, eating less food and getting more   exercise will help you lose weight. Joining a weight control group may also help. Ask your caregivers for a good weight control group in your area.  Eat low-fat foods instead of high-fat foods. This can help you lose weight too.  These foods are lower in fat. Eat MORE of these:    Dried beans, peas, and lentils.  Egg whites.  Low-fat cottage cheese.  Fish.  Lean cuts of meat, such as round, sirloin, rump, and flank (cut extra fat off meat you fix).  Whole grain breads, cereals and pasta.  Skim and nonfat dry milk.  Low-fat yogurt.  Poultry without the skin.  Cheese made with skim or part-skim milk, such as mozzarella, parmesan, farmers', ricotta, or pot cheese. These are higher fat foods. Eat LESS of these:   Whole milk and foods made from whole milk, such as American, blue, cheddar, monterey jack, and swiss cheese  High-fat meats, such as luncheon meats, sausages, knockwurst, bratwurst, hot dogs, ribs, corned beef, ground pork, and regular ground beef.  Fried foods. Limit saturated fats in your diet. Substituting unsaturated fat for saturated fat may decrease your blood triglyceride level. You will need to read package labels to know which products contain saturated fats.  These foods are high in saturated fat. Eat LESS of these:   Fried pork skins.  Whole milk.  Skin and fat from poultry.  Palm oil.  Butter.  Shortening.  Cream cheese.  Bacon.  Margarines and baked goods made from listed oils.  Vegetable shortenings.  Chitterlings.  Fat from meats.  Coconut oil.  Palm kernel oil.  Lard.  Cream.  Sour cream.  Fatback.  Coffee whiteners and non-dairy creamers made with these oils.  Cheese made from whole milk. Use unsaturated fats (both polyunsaturated and monounsaturated) moderately. Remember, even though unsaturated fats are better than saturated fats; you still want a diet low in total fat.  These foods are high in unsaturated fat:   Canola oil.  Sunflower oil.  Mayonnaise.  Almonds.  Peanuts.  Pine nuts.  Margarines made with these oils.  Safflower oil.  Olive oil.  Avocados.  Cashews.  Peanut butter.  Sunflower seeds.  Soybean oil.  Peanut  oil.  Olives.  Pecans.  Walnuts.  Pumpkin seeds. Avoid sugar and other high-sugar foods. This will decrease carbohydrates without decreasing other nutrients. Sugar in your food goes rapidly to your blood. When there is excess sugar in your blood, your liver may use it to make more triglycerides. Sugar also contains calories without other important nutrients.  Eat LESS of these:   Sugar, brown sugar, powdered sugar, jam, jelly, preserves, honey, syrup, molasses, pies, candy, cakes, cookies, frosting, pastries, colas, soft drinks, punches, fruit drinks, and regular gelatin.  Avoid alcohol. Alcohol, even more than sugar, may increase blood triglycerides. In addition, alcohol is high in calories and low in nutrients. Ask for sparkling water, or a diet soft drink instead of an alcoholic beverage. Suggestions for planning and preparing meals   Bake, broil, grill or roast meats instead of frying.  Remove fat from meats and skin from poultry before cooking.  Add spices, herbs, lemon juice or vinegar to vegetables instead of salt, rich sauces or gravies.  Use a non-stick skillet without fat or use no-stick sprays.  Cool and refrigerate stews and broth. Then remove the hardened fat floating on the surface before serving.  Refrigerate meat drippings and skim off fat to make low-fat gravies.  Serve more fish.  Use less butter,   margarine and other high-fat spreads on bread or vegetables.  Use skim or reconstituted non-fat dry milk for cooking.  Cook with low-fat cheeses.  Substitute low-fat yogurt or cottage cheese for all or part of the sour cream in recipes for sauces, dips or congealed salads.  Use half yogurt/half mayonnaise in salad recipes.  Substitute evaporated skim milk for cream. Evaporated skim milk or reconstituted non-fat dry milk can be whipped and substituted for whipped cream in certain recipes.  Choose fresh fruits for dessert instead of high-fat foods such as pies or  cakes. Fruits are naturally low in fat. When Dining Out   Order low-fat appetizers such as fruit or vegetable juice, pasta with vegetables or tomato sauce.  Select clear, rather than cream soups.  Ask that dressings and gravies be served on the side. Then use less of them.  Order foods that are baked, broiled, poached, steamed, stir-fried, or roasted.  Ask for margarine instead of butter, and use only a small amount.  Drink sparkling water, unsweetened tea or coffee, or diet soft drinks instead of alcohol or other sweet beverages. QUESTIONS AND ANSWERS ABOUT OTHER FATS IN THE BLOOD: SATURATED FAT, TRANS FAT, AND CHOLESTEROL What is trans fat? Trans fat is a type of fat that is formed when vegetable oil is hardened through a process called hydrogenation. This process helps makes foods more solid, gives them shape, and prolongs their shelf life. Trans fats are also called hydrogenated or partially hydrogenated oils.  What do saturated fat, trans fat, and cholesterol in foods have to do with heart disease? Saturated fat, trans fat, and cholesterol in the diet all raise the level of LDL "bad" cholesterol in the blood. The higher the LDL cholesterol, the greater the risk for coronary heart disease (CHD). Saturated fat and trans fat raise LDL similarly.  What foods contain saturated fat, trans fat, and cholesterol? High amounts of saturated fat are found in animal products, such as fatty cuts of meat, chicken skin, and full-fat dairy products like butter, whole milk, cream, and cheese, and in tropical vegetable oils such as palm, palm kernel, and coconut oil. Trans fat is found in some of the same foods as saturated fat, such as vegetable shortening, some margarines (especially hard or stick margarine), crackers, cookies, baked goods, fried foods, salad dressings, and other processed foods made with partially hydrogenated vegetable oils. Small amounts of trans fat also occur naturally in some animal  products, such as milk products, beef, and lamb. Foods high in cholesterol include liver, other organ meats, egg yolks, shrimp, and full-fat dairy products. How can I use the new food label to make heart-healthy food choices? Check the Nutrition Facts panel of the food label. Choose foods lower in saturated fat, trans fat, and cholesterol. For saturated fat and cholesterol, you can also use the Percent Daily Value (%DV): 5% DV or less is low, and 20% DV or more is high. (There is no %DV for trans fat.) Use the Nutrition Facts panel to choose foods low in saturated fat and cholesterol, and if the trans fat is not listed, read the ingredients and limit products that list shortening or hydrogenated or partially hydrogenated vegetable oil, which tend to be high in trans fat. POINTS TO REMEMBER:   Discuss your risk for heart disease with your caregivers, and take steps to reduce risk factors.  Change your diet. Choose foods that are low in saturated fat, trans fat, and cholesterol.  Add exercise to your daily routine if   it is not already being done. Participate in physical activity of moderate intensity, like brisk walking, for at least 30 minutes on most, and preferably all days of the week. No time? Break the 30 minutes into three, 10-minute segments during the day.  Stop smoking. If you do smoke, contact your caregiver to discuss ways in which they can help you quit.  Do not use street drugs.  Maintain a normal weight.  Maintain a healthy blood pressure.  Keep up with your blood work for checking the fats in your blood as directed by your caregiver. Document Released: 04/30/2004 Document Revised: 01/12/2012 Document Reviewed: 11/26/2008 [employer] Patient Information 2014 [employer].  

## 2013-05-24 DIAGNOSIS — L4050 Arthropathic psoriasis, unspecified: Secondary | ICD-10-CM | POA: Diagnosis not present

## 2013-05-24 DIAGNOSIS — Z79899 Other long term (current) drug therapy: Secondary | ICD-10-CM | POA: Diagnosis not present

## 2013-05-25 DIAGNOSIS — H3581 Retinal edema: Secondary | ICD-10-CM | POA: Diagnosis not present

## 2013-05-25 DIAGNOSIS — H348192 Central retinal vein occlusion, unspecified eye, stable: Secondary | ICD-10-CM | POA: Diagnosis not present

## 2013-06-07 DIAGNOSIS — L4050 Arthropathic psoriasis, unspecified: Secondary | ICD-10-CM | POA: Diagnosis not present

## 2013-06-07 DIAGNOSIS — D709 Neutropenia, unspecified: Secondary | ICD-10-CM | POA: Diagnosis not present

## 2013-06-07 DIAGNOSIS — D696 Thrombocytopenia, unspecified: Secondary | ICD-10-CM | POA: Diagnosis not present

## 2013-06-07 DIAGNOSIS — D529 Folate deficiency anemia, unspecified: Secondary | ICD-10-CM | POA: Diagnosis not present

## 2013-06-20 ENCOUNTER — Other Ambulatory Visit: Payer: Self-pay | Admitting: Internal Medicine

## 2013-06-28 DIAGNOSIS — L4050 Arthropathic psoriasis, unspecified: Secondary | ICD-10-CM | POA: Diagnosis not present

## 2013-06-28 DIAGNOSIS — Z79899 Other long term (current) drug therapy: Secondary | ICD-10-CM | POA: Diagnosis not present

## 2013-07-10 ENCOUNTER — Other Ambulatory Visit: Payer: Self-pay

## 2013-07-10 MED ORDER — METFORMIN HCL 1000 MG PO TABS: 1000.0000 mg | ORAL_TABLET | Freq: Two times a day (BID) | ORAL | Status: DC

## 2013-07-10 NOTE — Telephone Encounter (Signed)
Michael [name] left v/m requesting refill metformin to [facility] [address]. Advised Michael Lofstrom done.

## 2013-07-12 DIAGNOSIS — H3581 Retinal edema: Secondary | ICD-10-CM | POA: Diagnosis not present

## 2013-07-12 DIAGNOSIS — H348192 Central retinal vein occlusion, unspecified eye, stable: Secondary | ICD-10-CM | POA: Diagnosis not present

## 2013-07-12 DIAGNOSIS — E119 Type 2 diabetes mellitus without complications: Secondary | ICD-10-CM | POA: Diagnosis not present

## 2013-07-12 DIAGNOSIS — H43819 Vitreous degeneration, unspecified eye: Secondary | ICD-10-CM | POA: Diagnosis not present

## 2013-07-14 ENCOUNTER — Telehealth: Payer: Self-pay | Admitting: Oncology

## 2013-07-14 ENCOUNTER — Ambulatory Visit (HOSPITAL_BASED_OUTPATIENT_CLINIC_OR_DEPARTMENT_OTHER): Payer: Medicare Other | Admitting: Oncology

## 2013-07-14 ENCOUNTER — Other Ambulatory Visit (HOSPITAL_BASED_OUTPATIENT_CLINIC_OR_DEPARTMENT_OTHER): Payer: Medicare Other

## 2013-07-14 VITALS — BP 124/65 | HR 80 | Temp 97.6°F | Resp 18 | Ht 71.0 in | Wt 244.2 lb

## 2013-07-14 DIAGNOSIS — D500 Iron deficiency anemia secondary to blood loss (chronic): Secondary | ICD-10-CM

## 2013-07-14 DIAGNOSIS — Z862 Personal history of diseases of the blood and blood-forming organs and certain disorders involving the immune mechanism: Secondary | ICD-10-CM

## 2013-07-14 LAB — CBC WITH DIFFERENTIAL/PLATELET
BASO%: 0.5 % (ref 0.0–2.0)
Basophils Absolute: 0 10*3/uL (ref 0.0–0.1)
Eosinophils Absolute: 0.1 10*3/uL (ref 0.0–0.5)
LYMPH%: 7.6 % — ABNORMAL LOW (ref 14.0–49.0)
MCHC: 34 g/dL (ref 32.0–36.0)
MCV: 94.4 fL (ref 79.3–98.0)
MONO#: 0.5 10*3/uL (ref 0.1–0.9)
MONO%: 7.3 % (ref 0.0–14.0)
NEUT#: 5.5 10*3/uL (ref 1.5–6.5)
RBC: 3.33 10*6/uL — ABNORMAL LOW (ref 4.20–5.82)
RDW: 16.6 % — ABNORMAL HIGH (ref 11.0–14.6)
WBC: 6.7 10*3/uL (ref 4.0–10.3)

## 2013-07-14 LAB — CHCC SMEAR

## 2013-07-14 NOTE — Progress Notes (Signed)
Hematology and Oncology Follow Up Visit  Michael [name] [id number] May 05, 1941 72 y.o. 07/14/2013 10:40 AM Michael Organ, NP   Principle Diagnosis: 72 year old gentleman diagnosed with leukocytopenia and mild anemia. He was initially evaluated in June of 2014. His leukocytopenia is likely reactive and have resolved and his anemia is related to iron deficiency from AVM.   Current therapy: Oral iron replacement.  Interim History:  Michael [name] is a pleasant 72 year old gentleman presents today for a followup visit. Since his last visit, he been doing relatively fair without any new complications. He is tolerating oral iron replacement without any complications. He is not reporting any nausea or vomiting. Has not reported any hematochezia or melena. Has not reported any decline in his energy or performance status. Has not reported any recent hospitalizations or illnesses.  Medications: I have reviewed the patient's current medications.  Current Outpatient Prescriptions  Medication Sig Dispense Refill  . acetaminophen (TYLENOL) 650 MG CR tablet Take 650 mg by mouth every 8 (eight) hours as needed. Take two tablets by mouth as needed      . allopurinol (ZYLOPRIM) 100 MG tablet Take 200 mg by mouth every morning.       . ferrous sulfate 325 (65 FE) MG tablet Take 325 mg by mouth 3 (three) times daily with meals.      [name] gemfibrozil (LOPID) 600 MG tablet Take 600 mg by mouth 2 (two) times daily before a meal.      . glipiZIDE (GLUCOTROL XL) 10 MG 24 hr tablet Take 10 mg by mouth every morning.       [name] KRILL OIL 1000 MG CAPS Take 1 capsule by mouth every morning.      [name] levothyroxine (SYNTHROID, LEVOTHROID) 150 MCG tablet TAKE 1 TABLET BY MOUTH DAILY  90 tablet  0  . lisinopril (PRINIVIL,ZESTRIL) 20 MG tablet Take 20 mg by mouth every morning.       . metFORMIN (GLUCOPHAGE) 1000 MG tablet Take 1 tablet (1,000 mg total) by mouth 2 (two) times daily with a meal.  180 tablet  0  .  methotrexate (RHEUMATREX) 2.5 MG tablet Take 2.5 mg by mouth daily.      . niacin 500 MG CR capsule Take 1,000 mg by mouth every morning.      [name] omeprazole (PRILOSEC) 40 MG capsule Take 1 capsule (40 mg total) by mouth daily.  90 capsule  3  . pravastatin (PRAVACHOL) 10 MG tablet Take 10 mg by mouth at bedtime.      . predniSONE (DELTASONE) 5 MG tablet Take 5 mg by mouth every morning.        No current facility-administered medications for this visit.     Allergies:  Allergies  Allergen Reactions  . Percocet [Oxycodone-Acetaminophen] Nausea And Vomiting    Past Medical History, Surgical history, Social history, and Family History were reviewed and updated.  Review of Systems:  Remaining ROS negative. Physical Exam: Blood pressure 124/65, pulse 80, temperature 97.6 F (36.4 C), temperature source Oral, resp. rate 18, height 5\' 11"  (1.803 m), weight 244 lb 3.2 oz (110.768 kg). ECOG: 1 General appearance: alert Head: Normocephalic, without obvious abnormality, atraumatic Neck: no adenopathy, no carotid bruit, no JVD, supple, symmetrical, trachea midline and thyroid not enlarged, symmetric, no tenderness/mass/nodules Lymph nodes: Cervical, supraclavicular, and axillary nodes normal. Heart:regular rate and rhythm, S1, S2 normal, no murmur, click, rub or gallop Lung:chest clear, no wheezing, rales, normal symmetric air entry, Heart exam - S1, S2 normal,  no murmur, no gallop, rate regular Abdomin: soft, non-tender, without masses or organomegaly EXT:no erythema, induration, or nodules   Lab Results: Lab Results  Component Value Date   WBC 6.7 07/14/2013   HGB 10.7* 07/14/2013   HCT 31.4* 07/14/2013   MCV 94.4 07/14/2013   PLT 254 07/14/2013     Chemistry      Component Value Date/Time   NA 138 01/12/2013 1053   NA 140 12/08/2012 1049   K 4.3 01/12/2013 1053   K 4.4 12/08/2012 1049   CL 104 01/12/2013 1053   CL 106 12/08/2012 1049   CO2 25 01/12/2013 1053   CO2 29 12/08/2012  1049   BUN 23.2 01/12/2013 1053   BUN 26* 12/08/2012 1049   CREATININE 1.1 01/12/2013 1053   CREATININE 1.0 12/08/2012 1049      Component Value Date/Time   CALCIUM 9.3 01/12/2013 1053   CALCIUM 8.8 12/08/2012 1049   ALKPHOS 49 01/12/2013 1053   ALKPHOS 37* 12/08/2012 1049   AST 13 01/12/2013 1053   AST 20 12/08/2012 1049   ALT 9 01/12/2013 1053   ALT 20 12/08/2012 1049   BILITOT 0.77 01/12/2013 1053   BILITOT 0.7 12/08/2012 1049      Impression and Plan:  72 year old gentleman with the following issues:   1. Leukocytopenia: His white cell count is normal today. This most likely reactive finding due to autoimmune arthritis and we will continue to monitor him for that. It is possible that you have a lymphoproliferative disorder and for that we will continue to observe him.  2. Iron deficiency anemia: He is on iron replacement orally and his hemoglobin is relatively stable. I discussed with him the possibility of using IV iron in the future if needed to.  3. Followup: Will be in 6 months for a routine followup and laboratory testing.   Michael Vidas, MD 12/19/201410:40 AM

## 2013-07-14 NOTE — Telephone Encounter (Signed)
Called pt and left message regarding June 2015 lab and MD

## 2013-08-24 DIAGNOSIS — H348192 Central retinal vein occlusion, unspecified eye, stable: Secondary | ICD-10-CM | POA: Diagnosis not present

## 2013-08-24 DIAGNOSIS — E119 Type 2 diabetes mellitus without complications: Secondary | ICD-10-CM | POA: Diagnosis not present

## 2013-08-24 DIAGNOSIS — H3581 Retinal edema: Secondary | ICD-10-CM | POA: Diagnosis not present

## 2013-08-24 DIAGNOSIS — H43819 Vitreous degeneration, unspecified eye: Secondary | ICD-10-CM | POA: Diagnosis not present

## 2013-09-11 ENCOUNTER — Other Ambulatory Visit: Payer: Self-pay | Admitting: Internal Medicine

## 2013-10-04 ENCOUNTER — Other Ambulatory Visit: Payer: Self-pay | Admitting: Physician Assistant

## 2013-10-04 ENCOUNTER — Other Ambulatory Visit: Payer: Self-pay | Admitting: Internal Medicine

## 2013-10-09 DIAGNOSIS — L4050 Arthropathic psoriasis, unspecified: Secondary | ICD-10-CM | POA: Diagnosis not present

## 2013-10-09 DIAGNOSIS — Z79899 Other long term (current) drug therapy: Secondary | ICD-10-CM | POA: Diagnosis not present

## 2013-10-09 DIAGNOSIS — D696 Thrombocytopenia, unspecified: Secondary | ICD-10-CM | POA: Diagnosis not present

## 2013-10-09 DIAGNOSIS — D529 Folate deficiency anemia, unspecified: Secondary | ICD-10-CM | POA: Diagnosis not present

## 2013-10-09 DIAGNOSIS — D709 Neutropenia, unspecified: Secondary | ICD-10-CM | POA: Diagnosis not present

## 2013-10-09 LAB — CBC AND DIFFERENTIAL
HEMOGLOBIN: 12.4 g/dL — AB (ref 13.5–17.5)
Platelets: 161 10*3/uL (ref 150–399)
WBC: 4.6 10^3/mL

## 2013-10-09 LAB — HEPATIC FUNCTION PANEL
ALT: 17 U/L (ref 10–40)
AST: 17 U/L (ref 14–40)
Alkaline Phosphatase: 61 U/L (ref 25–125)
BILIRUBIN, TOTAL: 0.8 mg/dL

## 2013-10-09 LAB — BASIC METABOLIC PANEL
BUN: 20 mg/dL (ref 4–21)
Creatinine: 1 mg/dL (ref 0.6–1.3)
Glucose: 263 mg/dL
Potassium: 4.3 mmol/L (ref 3.4–5.3)
SODIUM: 137 mmol/L (ref 137–147)

## 2013-10-19 DIAGNOSIS — H43819 Vitreous degeneration, unspecified eye: Secondary | ICD-10-CM | POA: Diagnosis not present

## 2013-10-19 DIAGNOSIS — H3581 Retinal edema: Secondary | ICD-10-CM | POA: Diagnosis not present

## 2013-10-19 DIAGNOSIS — E119 Type 2 diabetes mellitus without complications: Secondary | ICD-10-CM | POA: Diagnosis not present

## 2013-10-19 DIAGNOSIS — H348192 Central retinal vein occlusion, unspecified eye, stable: Secondary | ICD-10-CM | POA: Diagnosis not present

## 2013-11-01 ENCOUNTER — Ambulatory Visit: Payer: Medicare Other | Admitting: Physician Assistant

## 2013-11-06 ENCOUNTER — Ambulatory Visit (INDEPENDENT_AMBULATORY_CARE_PROVIDER_SITE_OTHER): Payer: Medicare Other | Admitting: Internal Medicine

## 2013-11-06 ENCOUNTER — Encounter: Payer: Self-pay | Admitting: Internal Medicine

## 2013-11-06 VITALS — BP 176/80 | HR 76 | Temp 98.3°F | Resp 16 | Wt 246.0 lb

## 2013-11-06 DIAGNOSIS — E119 Type 2 diabetes mellitus without complications: Secondary | ICD-10-CM

## 2013-11-06 DIAGNOSIS — L4050 Arthropathic psoriasis, unspecified: Secondary | ICD-10-CM | POA: Insufficient documentation

## 2013-11-06 DIAGNOSIS — N320 Bladder-neck obstruction: Secondary | ICD-10-CM

## 2013-11-06 DIAGNOSIS — R51 Headache: Secondary | ICD-10-CM

## 2013-11-06 DIAGNOSIS — D500 Iron deficiency anemia secondary to blood loss (chronic): Secondary | ICD-10-CM

## 2013-11-06 DIAGNOSIS — R519 Headache, unspecified: Secondary | ICD-10-CM | POA: Insufficient documentation

## 2013-11-06 DIAGNOSIS — I6529 Occlusion and stenosis of unspecified carotid artery: Secondary | ICD-10-CM | POA: Insufficient documentation

## 2013-11-06 DIAGNOSIS — M109 Gout, unspecified: Secondary | ICD-10-CM

## 2013-11-06 DIAGNOSIS — R202 Paresthesia of skin: Secondary | ICD-10-CM

## 2013-11-06 DIAGNOSIS — R209 Unspecified disturbances of skin sensation: Secondary | ICD-10-CM

## 2013-11-06 DIAGNOSIS — I10 Essential (primary) hypertension: Secondary | ICD-10-CM

## 2013-11-06 DIAGNOSIS — E559 Vitamin D deficiency, unspecified: Secondary | ICD-10-CM

## 2013-11-06 MED ORDER — CARVEDILOL 12.5 MG PO TABS: 12.5000 mg | ORAL_TABLET | Freq: Two times a day (BID) | ORAL | Status: DC

## 2013-11-06 MED ORDER — VITAMIN D 1000 UNITS PO TABS: 1000.0000 [IU] | ORAL_TABLET | Freq: Every day | ORAL | Status: DC

## 2013-11-06 NOTE — Assessment & Plan Note (Signed)
Dr Gwenyth Bender

## 2013-11-06 NOTE — Progress Notes (Signed)
Pre visit review using our clinic review tool, if applicable. No additional management support is needed unless otherwise documented below in the visit note. 

## 2013-11-06 NOTE — Assessment & Plan Note (Signed)
Treat HTN Labs

## 2013-11-06 NOTE — Assessment & Plan Note (Signed)
Continue with current prescription therapy as reflected on the Med list. Labs  

## 2013-11-06 NOTE — Assessment & Plan Note (Signed)
On MTX Prednisone prn

## 2013-11-06 NOTE — Assessment & Plan Note (Signed)
Continue with current prescription therapy as reflected on the Med list. Carot doppler

## 2013-11-06 NOTE — Assessment & Plan Note (Signed)
See Rx 

## 2013-11-06 NOTE — Assessment & Plan Note (Signed)
Continue with current prescription therapy as reflected on the Med list.  

## 2013-11-07 ENCOUNTER — Other Ambulatory Visit (INDEPENDENT_AMBULATORY_CARE_PROVIDER_SITE_OTHER): Payer: Medicare Other

## 2013-11-07 ENCOUNTER — Telehealth: Payer: Self-pay | Admitting: Internal Medicine

## 2013-11-07 DIAGNOSIS — I6529 Occlusion and stenosis of unspecified carotid artery: Secondary | ICD-10-CM | POA: Diagnosis not present

## 2013-11-07 DIAGNOSIS — E119 Type 2 diabetes mellitus without complications: Secondary | ICD-10-CM | POA: Diagnosis not present

## 2013-11-07 DIAGNOSIS — L4050 Arthropathic psoriasis, unspecified: Secondary | ICD-10-CM

## 2013-11-07 DIAGNOSIS — M109 Gout, unspecified: Secondary | ICD-10-CM

## 2013-11-07 DIAGNOSIS — R51 Headache: Secondary | ICD-10-CM | POA: Diagnosis not present

## 2013-11-07 DIAGNOSIS — I10 Essential (primary) hypertension: Secondary | ICD-10-CM

## 2013-11-07 DIAGNOSIS — N320 Bladder-neck obstruction: Secondary | ICD-10-CM | POA: Diagnosis not present

## 2013-11-07 DIAGNOSIS — R209 Unspecified disturbances of skin sensation: Secondary | ICD-10-CM | POA: Diagnosis not present

## 2013-11-07 DIAGNOSIS — R202 Paresthesia of skin: Secondary | ICD-10-CM

## 2013-11-07 DIAGNOSIS — D500 Iron deficiency anemia secondary to blood loss (chronic): Secondary | ICD-10-CM

## 2013-11-07 DIAGNOSIS — E559 Vitamin D deficiency, unspecified: Secondary | ICD-10-CM

## 2013-11-07 LAB — VITAMIN B12: Vitamin B-12: 600 pg/mL (ref 211–911)

## 2013-11-07 LAB — LIPID PANEL
CHOLESTEROL: 149 mg/dL (ref 0–200)
HDL: 27.7 mg/dL — ABNORMAL LOW (ref >39.00)
LDL CALC: -7 mg/dL — AB (ref 0–99)
Total CHOL/HDL Ratio: 5
Triglycerides: 643 mg/dL — ABNORMAL HIGH (ref 0.0–149.0)
VLDL: 128.6 mg/dL — ABNORMAL HIGH (ref 0.0–40.0)

## 2013-11-07 LAB — CBC WITH DIFFERENTIAL/PLATELET
BASOS ABS: 0 10*3/uL (ref 0.0–0.1)
Basophils Relative: 0.4 % (ref 0.0–3.0)
EOS ABS: 0.1 10*3/uL (ref 0.0–0.7)
Eosinophils Relative: 1.4 % (ref 0.0–5.0)
HCT: 34.3 % — ABNORMAL LOW (ref 39.0–52.0)
HEMOGLOBIN: 12 g/dL — AB (ref 13.0–17.0)
Lymphocytes Relative: 13.6 % (ref 12.0–46.0)
Lymphs Abs: 0.5 10*3/uL — ABNORMAL LOW (ref 0.7–4.0)
MCHC: 34.9 g/dL (ref 30.0–36.0)
MCV: 89.9 fl (ref 78.0–100.0)
Monocytes Absolute: 0.2 10*3/uL (ref 0.1–1.0)
Monocytes Relative: 5.1 % (ref 3.0–12.0)
NEUTROS ABS: 3.1 10*3/uL (ref 1.4–7.7)
NEUTROS PCT: 79.5 % — AB (ref 43.0–77.0)
Platelets: 172 10*3/uL (ref 150.0–400.0)
RBC: 3.81 Mil/uL — ABNORMAL LOW (ref 4.22–5.81)
RDW: 16 % — AB (ref 11.5–14.6)
WBC: 3.9 10*3/uL — ABNORMAL LOW (ref 4.5–10.5)

## 2013-11-07 LAB — IBC PANEL
Iron: 106 ug/dL (ref 42–165)
SATURATION RATIOS: 34 % (ref 20.0–50.0)
Transferrin: 222.7 mg/dL (ref 212.0–360.0)

## 2013-11-07 LAB — TSH: TSH: 1.23 u[IU]/mL (ref 0.35–5.50)

## 2013-11-07 LAB — URINALYSIS
Bilirubin Urine: NEGATIVE
HGB URINE DIPSTICK: NEGATIVE
Ketones, ur: NEGATIVE
LEUKOCYTES UA: NEGATIVE
NITRITE: NEGATIVE
Specific Gravity, Urine: 1.02 (ref 1.000–1.030)
TOTAL PROTEIN, URINE-UPE24: NEGATIVE
Urine Glucose: NEGATIVE
Urobilinogen, UA: 0.2 (ref 0.0–1.0)
pH: 6 (ref 5.0–8.0)

## 2013-11-07 LAB — HEPATIC FUNCTION PANEL
ALK PHOS: 60 U/L (ref 39–117)
ALT: 18 U/L (ref 0–53)
AST: 14 U/L (ref 0–37)
Albumin: 4.1 g/dL (ref 3.5–5.2)
BILIRUBIN DIRECT: 0.1 mg/dL (ref 0.0–0.3)
Total Bilirubin: 0.8 mg/dL (ref 0.3–1.2)
Total Protein: 6.5 g/dL (ref 6.0–8.3)

## 2013-11-07 LAB — HEMOGLOBIN A1C: Hgb A1c MFr Bld: 6.2 % (ref 4.6–6.5)

## 2013-11-07 LAB — URIC ACID: Uric Acid, Serum: 5 mg/dL (ref 4.0–7.8)

## 2013-11-07 LAB — PSA: PSA: 0.5 ng/mL (ref 0.10–4.00)

## 2013-11-07 LAB — SEDIMENTATION RATE: SED RATE: 22 mm/h (ref 0–22)

## 2013-11-07 NOTE — Telephone Encounter (Signed)
Relevant patient education mailed to patient.  

## 2013-11-08 ENCOUNTER — Other Ambulatory Visit: Payer: Self-pay | Admitting: Internal Medicine

## 2013-11-08 LAB — VITAMIN D 25 HYDROXY (VIT D DEFICIENCY, FRACTURES): Vit D, 25-Hydroxy: 20 ng/mL — ABNORMAL LOW (ref 30–89)

## 2013-11-08 MED ORDER — ERGOCALCIFEROL 1.25 MG (50000 UT) PO CAPS: 50000.0000 [IU] | ORAL_CAPSULE | ORAL | Status: DC

## 2013-11-12 NOTE — Progress Notes (Signed)
   Subjective:    Patient ID: Michael [name], male    DOB: 04/04/1941, 73 y.o.   MRN: [id number]  HPI  C/o HTN, PVD, DM, hypothyroidism  BP Readings from Last 3 Encounters:  11/06/13 176/80  07/14/13 124/65  05/16/13 142/80      Review of Systems  Constitutional: Negative for appetite change, fatigue and unexpected weight change.  HENT: Negative for congestion, nosebleeds, sneezing, sore throat and trouble swallowing.   Eyes: Negative for itching and visual disturbance.  Respiratory: Negative for cough.   Cardiovascular: Negative for chest pain, palpitations and leg swelling.  Gastrointestinal: Negative for nausea, diarrhea, blood in stool and abdominal distention.  Genitourinary: Negative for frequency and hematuria.  Musculoskeletal: Negative for back pain, gait problem, joint swelling and neck pain.  Skin: Negative for rash.  Neurological: Negative for dizziness, tremors, speech difficulty and weakness.  Psychiatric/Behavioral: Negative for sleep disturbance, dysphoric mood and agitation. The patient is not nervous/anxious.        Objective:   Physical Exam  Constitutional: He is oriented to person, place, and time. He appears well-developed. No distress.  NAD  HENT:  Mouth/Throat: Oropharynx is clear and moist.  Eyes: Conjunctivae are normal. Pupils are equal, round, and reactive to light.  Neck: Normal range of motion. No JVD present. No thyromegaly present.  Cardiovascular: Normal rate, regular rhythm, normal heart sounds and intact distal pulses.  Exam reveals no gallop and no friction rub.   No murmur heard. Pulmonary/Chest: Effort normal and breath sounds normal. No respiratory distress. He has no wheezes. He has no rales. He exhibits no tenderness.  Abdominal: Soft. Bowel sounds are normal. He exhibits no distension and no mass. There is no tenderness. There is no rebound and no guarding.  Musculoskeletal: Normal range of motion. He exhibits no edema and no  tenderness.  Lymphadenopathy:    He has no cervical adenopathy.  Neurological: He is alert and oriented to person, place, and time. He has normal reflexes. No cranial nerve deficit. He exhibits normal muscle tone. He displays a negative Romberg sign. Coordination and gait normal.  No meningeal signs  Skin: Skin is warm and dry. No rash noted.  Psychiatric: He has a normal mood and affect. His behavior is normal. Judgment and thought content normal.     Lab Results  Component Value Date   WBC 3.9* 11/07/2013   HGB 12.0* 11/07/2013   HCT 34.3* 11/07/2013   PLT 172.0 11/07/2013   GLUCOSE 231* 01/12/2013   CHOL 149 11/07/2013   TRIG 643.0* 11/07/2013   HDL 27.70* 11/07/2013   LDLDIRECT 55.8 05/16/2013   LDLCALC -7* 11/07/2013   ALT 18 11/07/2013   AST 14 11/07/2013   NA 137 10/09/2013   K 4.3 10/09/2013   CL 104 01/12/2013   CREATININE 1.0 10/09/2013   BUN 20 10/09/2013   CO2 25 01/12/2013   TSH 1.23 11/07/2013   PSA 0.50 11/07/2013   INR 1.02 08/28/2009   HGBA1C 6.2 11/07/2013   MICROALBUR 1.2 05/16/2013        Assessment & Plan:

## 2013-11-13 ENCOUNTER — Encounter: Payer: Self-pay | Admitting: Internal Medicine

## 2013-11-13 ENCOUNTER — Ambulatory Visit (INDEPENDENT_AMBULATORY_CARE_PROVIDER_SITE_OTHER): Payer: Medicare Other | Admitting: Internal Medicine

## 2013-11-13 VITALS — BP 126/72 | HR 76 | Temp 97.6°F | Resp 16 | Wt 246.0 lb

## 2013-11-13 DIAGNOSIS — I10 Essential (primary) hypertension: Secondary | ICD-10-CM | POA: Diagnosis not present

## 2013-11-13 DIAGNOSIS — R51 Headache: Secondary | ICD-10-CM

## 2013-11-13 DIAGNOSIS — E119 Type 2 diabetes mellitus without complications: Secondary | ICD-10-CM

## 2013-11-13 DIAGNOSIS — L4050 Arthropathic psoriasis, unspecified: Secondary | ICD-10-CM

## 2013-11-13 DIAGNOSIS — I6529 Occlusion and stenosis of unspecified carotid artery: Secondary | ICD-10-CM | POA: Diagnosis not present

## 2013-11-13 DIAGNOSIS — E559 Vitamin D deficiency, unspecified: Secondary | ICD-10-CM | POA: Insufficient documentation

## 2013-11-13 DIAGNOSIS — D500 Iron deficiency anemia secondary to blood loss (chronic): Secondary | ICD-10-CM

## 2013-11-13 NOTE — Assessment & Plan Note (Signed)
Much better 

## 2013-11-13 NOTE — Assessment & Plan Note (Signed)
Continue with current prescription therapy as reflected on the Med list.  

## 2013-11-13 NOTE — Assessment & Plan Note (Signed)
Start Rx 

## 2013-11-13 NOTE — Assessment & Plan Note (Signed)
Better  Continue with current prescription therapy as reflected on the Med list. NAS diet

## 2013-11-13 NOTE — Progress Notes (Signed)
   Subjective:     HPI  F/u HTN, PVD, DM, hypothyroidism, Vit D def F/u occip HAs - better  BP Readings from Last 3 Encounters:  11/13/13 126/72  11/06/13 176/80  07/14/13 124/65      Review of Systems  Constitutional: Negative for appetite change, fatigue and unexpected weight change.  HENT: Negative for congestion, nosebleeds, sneezing, sore throat and trouble swallowing.   Eyes: Negative for itching and visual disturbance.  Respiratory: Negative for cough.   Cardiovascular: Negative for chest pain, palpitations and leg swelling.  Gastrointestinal: Negative for nausea, diarrhea, blood in stool and abdominal distention.  Genitourinary: Negative for frequency and hematuria.  Musculoskeletal: Negative for back pain, gait problem, joint swelling and neck pain.  Skin: Negative for rash.  Neurological: Negative for dizziness, tremors, speech difficulty and weakness.  Psychiatric/Behavioral: Negative for sleep disturbance, dysphoric mood and agitation. The patient is not nervous/anxious.        Objective:   Physical Exam  Constitutional: He is oriented to person, place, and time. He appears well-developed. No distress.  NAD  HENT:  Mouth/Throat: Oropharynx is clear and moist.  Eyes: Conjunctivae are normal. Pupils are equal, round, and reactive to light.  Neck: Normal range of motion. No JVD present. No thyromegaly present.  Cardiovascular: Normal rate, regular rhythm, normal heart sounds and intact distal pulses.  Exam reveals no gallop and no friction rub.   No murmur heard. Pulmonary/Chest: Effort normal and breath sounds normal. No respiratory distress. He has no wheezes. He has no rales. He exhibits no tenderness.  Abdominal: Soft. Bowel sounds are normal. He exhibits no distension and no mass. There is no tenderness. There is no rebound and no guarding.  Musculoskeletal: Normal range of motion. He exhibits no edema and no tenderness.  Lymphadenopathy:    He has no  cervical adenopathy.  Neurological: He is alert and oriented to person, place, and time. He has normal reflexes. No cranial nerve deficit. He exhibits normal muscle tone. He displays a negative Romberg sign. Coordination and gait normal.  No meningeal signs  Skin: Skin is warm and dry. No rash noted.  Psychiatric: He has a normal mood and affect. His behavior is normal. Judgment and thought content normal.     Lab Results  Component Value Date   WBC 3.9* 11/07/2013   HGB 12.0* 11/07/2013   HCT 34.3* 11/07/2013   PLT 172.0 11/07/2013   GLUCOSE 231* 01/12/2013   CHOL 149 11/07/2013   TRIG 643.0* 11/07/2013   HDL 27.70* 11/07/2013   LDLDIRECT 55.8 05/16/2013   LDLCALC -7* 11/07/2013   ALT 18 11/07/2013   AST 14 11/07/2013   NA 137 10/09/2013   K 4.3 10/09/2013   CL 104 01/12/2013   CREATININE 1.0 10/09/2013   BUN 20 10/09/2013   CO2 25 01/12/2013   TSH 1.23 11/07/2013   PSA 0.50 11/07/2013   INR 1.02 08/28/2009   HGBA1C 6.2 11/07/2013   MICROALBUR 1.2 05/16/2013        Assessment & Plan:

## 2013-11-13 NOTE — Assessment & Plan Note (Signed)
Continue with current prescription therapy as reflected on the Med list. MTX gives diarrhea

## 2013-11-13 NOTE — Progress Notes (Deleted)
Pre visit review using our clinic review tool, if applicable. No additional management support is needed unless otherwise documented below in the visit note. 

## 2013-11-14 ENCOUNTER — Telehealth: Payer: Self-pay | Admitting: Internal Medicine

## 2013-11-14 NOTE — Telephone Encounter (Signed)
Relevant patient education mailed to patient.  

## 2013-11-21 ENCOUNTER — Telehealth: Payer: Self-pay

## 2013-11-21 NOTE — Telephone Encounter (Signed)
Relevant patient education mailed to patient.  

## 2013-12-06 ENCOUNTER — Other Ambulatory Visit: Payer: Self-pay | Admitting: Internal Medicine

## 2013-12-06 NOTE — Telephone Encounter (Signed)
Last filled 08/2013 #90 by Webb Silversmith-- I see that pt is now seeing you--please advise if okay to refill

## 2013-12-28 DIAGNOSIS — H43819 Vitreous degeneration, unspecified eye: Secondary | ICD-10-CM | POA: Diagnosis not present

## 2013-12-28 DIAGNOSIS — H3581 Retinal edema: Secondary | ICD-10-CM | POA: Diagnosis not present

## 2013-12-28 DIAGNOSIS — H348192 Central retinal vein occlusion, unspecified eye, stable: Secondary | ICD-10-CM | POA: Diagnosis not present

## 2014-01-10 ENCOUNTER — Other Ambulatory Visit: Payer: Self-pay | Admitting: *Deleted

## 2014-01-10 MED ORDER — GLUCOSE BLOOD VI STRP: ORAL_STRIP | Status: DC

## 2014-01-12 ENCOUNTER — Ambulatory Visit: Payer: Medicare Other | Admitting: Oncology

## 2014-01-12 ENCOUNTER — Other Ambulatory Visit: Payer: Medicare Other

## 2014-01-15 ENCOUNTER — Telehealth: Payer: Self-pay | Admitting: Oncology

## 2014-01-15 DIAGNOSIS — Z79899 Other long term (current) drug therapy: Secondary | ICD-10-CM | POA: Diagnosis not present

## 2014-01-15 DIAGNOSIS — L4050 Arthropathic psoriasis, unspecified: Secondary | ICD-10-CM | POA: Diagnosis not present

## 2014-01-15 NOTE — Telephone Encounter (Signed)
[facility] 6/19, r/s to 6/23 per pt

## 2014-01-16 ENCOUNTER — Ambulatory Visit (HOSPITAL_BASED_OUTPATIENT_CLINIC_OR_DEPARTMENT_OTHER): Payer: Medicare Other | Admitting: Oncology

## 2014-01-16 ENCOUNTER — Encounter: Payer: Self-pay | Admitting: Oncology

## 2014-01-16 ENCOUNTER — Other Ambulatory Visit (HOSPITAL_BASED_OUTPATIENT_CLINIC_OR_DEPARTMENT_OTHER): Payer: Medicare Other

## 2014-01-16 VITALS — BP 176/78 | HR 69 | Temp 96.8°F | Resp 19 | Ht 71.0 in | Wt 241.3 lb

## 2014-01-16 DIAGNOSIS — I6529 Occlusion and stenosis of unspecified carotid artery: Secondary | ICD-10-CM | POA: Diagnosis not present

## 2014-01-16 DIAGNOSIS — D72819 Decreased white blood cell count, unspecified: Secondary | ICD-10-CM

## 2014-01-16 DIAGNOSIS — D500 Iron deficiency anemia secondary to blood loss (chronic): Secondary | ICD-10-CM

## 2014-01-16 LAB — IRON AND TIBC CHCC
%SAT: 48 % (ref 20–55)
Iron: 135 ug/dL (ref 42–163)
TIBC: 281 ug/dL (ref 202–409)
UIBC: 147 ug/dL (ref 117–376)

## 2014-01-16 LAB — CBC WITH DIFFERENTIAL/PLATELET
BASO%: 0.7 % (ref 0.0–2.0)
Basophils Absolute: 0 10*3/uL (ref 0.0–0.1)
EOS%: 2.5 % (ref 0.0–7.0)
Eosinophils Absolute: 0.1 10*3/uL (ref 0.0–0.5)
HEMATOCRIT: 33.5 % — AB (ref 38.4–49.9)
HGB: 11.5 g/dL — ABNORMAL LOW (ref 13.0–17.1)
LYMPH%: 19.3 % (ref 14.0–49.0)
MCH: 30.9 pg (ref 27.2–33.4)
MCHC: 34.4 g/dL (ref 32.0–36.0)
MCV: 89.7 fL (ref 79.3–98.0)
MONO#: 0.2 10*3/uL (ref 0.1–0.9)
MONO%: 6 % (ref 0.0–14.0)
NEUT#: 2.7 10*3/uL (ref 1.5–6.5)
NEUT%: 71.5 % (ref 39.0–75.0)
PLATELETS: 161 10*3/uL (ref 140–400)
RBC: 3.73 10*6/uL — ABNORMAL LOW (ref 4.20–5.82)
RDW: 17.5 % — ABNORMAL HIGH (ref 11.0–14.6)
WBC: 3.8 10*3/uL — ABNORMAL LOW (ref 4.0–10.3)
lymph#: 0.7 10*3/uL — ABNORMAL LOW (ref 0.9–3.3)

## 2014-01-16 LAB — COMPREHENSIVE METABOLIC PANEL (CC13)
ALK PHOS: 63 U/L (ref 40–150)
ALT: 8 U/L (ref 0–55)
AST: 9 U/L (ref 5–34)
Albumin: 3.9 g/dL (ref 3.5–5.0)
Anion Gap: 9 mEq/L (ref 3–11)
BILIRUBIN TOTAL: 0.72 mg/dL (ref 0.20–1.20)
BUN: 15.5 mg/dL (ref 7.0–26.0)
CO2: 25 mEq/L (ref 22–29)
Calcium: 8.9 mg/dL (ref 8.4–10.4)
Chloride: 106 mEq/L (ref 98–109)
Creatinine: 1.1 mg/dL (ref 0.7–1.3)
Glucose: 230 mg/dl — ABNORMAL HIGH (ref 70–140)
Potassium: 4.1 mEq/L (ref 3.5–5.1)
Sodium: 140 mEq/L (ref 136–145)
Total Protein: 6.6 g/dL (ref 6.4–8.3)

## 2014-01-16 LAB — FERRITIN CHCC: Ferritin: 496 ng/ml — ABNORMAL HIGH (ref 22–316)

## 2014-01-16 NOTE — Progress Notes (Signed)
Hematology and Oncology Follow Up Visit  Michael [name] [id number] [birth year] 73 y.o. 01/16/2014 9:58 AM Michael [name], MDBaity, Michael Fare, NP   Principle Diagnosis: 73 year old gentleman diagnosed with leukocytopenia and mild anemia. He was initially evaluated in June of 2014. His leukocytopenia is likely reactive and have resolved and his anemia is related to iron deficiency from AVM.   Current therapy: Oral iron replacement three times a day.   Interim History:  [name] is a pleasant 73 year old gentleman presents today for a followup visit with his wife. Since his last visit, he been doing relatively fair without any new complications. He is tolerating oral iron replacement without any complications. He is not reporting any nausea or vomiting. Has not reported any hematochezia or melena. He is not reporting constipation but reports some diarrhea associated with methotrexate Has not reported any decline in his energy or performance status. Has not reported any recent hospitalizations or illnesses. Has not reported any headaches or blurry vision or double vision. Is not reporting any chest pain shortness of breath or cough. Has not reported any hemoptysis or hematemesis. Does not report any nausea or vomiting or abdominal pain. Did not report any frequency urgency or hesitancy. He does have skin rashes associated with psoriasis. Rest of his review of systems unremarkable  Medications: I have reviewed the patient's current medications.  Current Outpatient Prescriptions  Medication Sig Dispense Refill  . acetaminophen (TYLENOL) 650 MG CR tablet Take 650 mg by mouth every 8 (eight) hours as needed. Take two tablets by mouth as needed      . allopurinol (ZYLOPRIM) 100 MG tablet Take 200 mg by mouth every morning.       . carvedilol (COREG) 12.5 MG tablet Take 1 tablet (12.5 mg total) by mouth 2 (two) times daily with a meal.  60 tablet  11  . cholecalciferol (VITAMIN D) 1000 UNITS tablet Take 1 tablet  (1,000 Units total) by mouth daily.  100 tablet  3  . ergocalciferol (VITAMIN D2) 50000 UNITS capsule Take 1 capsule (50,000 Units total) by mouth once a week.  6 capsule  0  . ferrous sulfate 325 (65 FE) MG tablet Take 325 mg by mouth 3 (three) times daily with meals.      [name] gemfibrozil (LOPID) 600 MG tablet Take 600 mg by mouth 2 (two) times daily before a meal.      . glipiZIDE (GLUCOTROL XL) 10 MG 24 hr tablet Take 10 mg by mouth every morning.       [name] glucose blood (ACCU-CHEK COMPACT STRIPS) test strip Use twice daily as instructed. Dx: 250.00, 401.1  100 each  3  . KRILL OIL 1000 MG CAPS Take 1 capsule by mouth every morning.      [name] levothyroxine (SYNTHROID, LEVOTHROID) 150 MCG tablet TAKE 1 TABLET BY MOUTH EVERY DAY BEFORE BREAKFAST  90 tablet  1  . lisinopril (PRINIVIL,ZESTRIL) 20 MG tablet Take 20 mg by mouth every morning.       . metFORMIN (GLUCOPHAGE) 1000 MG tablet Take 1 tablet (1,000 mg total) by mouth 2 (two) times daily with a meal.  180 tablet  0  . methotrexate (RHEUMATREX) 2.5 MG tablet Take 2.5 mg by mouth 4 (four) times a week.       [name] omeprazole (PRILOSEC) 40 MG capsule TAKE ONE CAPSULE BY MOUTH M EVERY DAY  90 capsule  0  . pravastatin (PRAVACHOL) 10 MG tablet Take 10 mg by mouth at bedtime.      [name]  predniSONE (DELTASONE) 5 MG tablet Take 5 mg by mouth every morning.        No current facility-administered medications for this visit.     Allergies:  Allergies  Allergen Reactions  . Percocet [Oxycodone-Acetaminophen] Nausea And Vomiting    Past Medical History, Surgical history, Social history, and Family History were reviewed and updated.  Physical Exam: Blood pressure 176/78, pulse 69, temperature 96.8 F (36 C), temperature source Oral, resp. rate 19, height 5\' 11"  (1.803 m), weight 241 lb 4.8 oz (109.453 kg). ECOG: 1 General appearance: alert awake not in any distress. Head: Normocephalic, without obvious abnormality Neck: no adenopathy Lymph nodes:  Cervical, supraclavicular, and axillary nodes normal. Heart:regular rate and rhythm, S1, S2 normal, no murmur, click, rub or gallop Lung:chest clear, no wheezing, rales, normal symmetric air entry. Abdomin: soft, non-tender, without masses or organomegaly EXT:no erythema, induration, or nodules   Lab Results: Lab Results  Component Value Date   WBC 3.8* 01/16/2014   HGB 11.5* 01/16/2014   HCT 33.5* 01/16/2014   MCV 89.7 01/16/2014   PLT 161 01/16/2014     Chemistry      Component Value Date/Time   NA 137 10/09/2013   NA 138 01/12/2013 1053   NA 140 12/08/2012 1049   K 4.3 10/09/2013   K 4.3 01/12/2013 1053   CL 104 01/12/2013 1053   CL 106 12/08/2012 1049   CO2 25 01/12/2013 1053   CO2 29 12/08/2012 1049   BUN 20 10/09/2013   BUN 23.2 01/12/2013 1053   BUN 26* 12/08/2012 1049   CREATININE 1.0 10/09/2013   CREATININE 1.1 01/12/2013 1053   CREATININE 1.0 12/08/2012 1049   GLU 263 10/09/2013      Component Value Date/Time   CALCIUM 9.3 01/12/2013 1053   CALCIUM 8.8 12/08/2012 1049   ALKPHOS 60 11/07/2013 0754   ALKPHOS 49 01/12/2013 1053   AST 14 11/07/2013 0754   AST 13 01/12/2013 1053   ALT 18 11/07/2013 0754   ALT 9 01/12/2013 1053   BILITOT 0.8 11/07/2013 0754   BILITOT 0.77 01/12/2013 1053      Impression and Plan:  73 year old gentleman with the following issues:   1. 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 I doubt he has a lymphoproliferative disorder but we will continue to monitor. 2. Iron deficiency anemia: He is on iron replacement orally and his hemoglobin is relatively stable. I discussed with him the possibility of using IV iron in the future if needed to. For the time being he declines.  3. Followup: Will be in 6 months for a routine followup and laboratory testing.   [facility], MD 6/23/20159:58 AM

## 2014-01-17 ENCOUNTER — Telehealth: Payer: Self-pay | Admitting: Oncology

## 2014-01-17 NOTE — Telephone Encounter (Signed)
lvm for pt regarding to Dec appt....mailed pt appt sched/avs and letter °

## 2014-02-06 DIAGNOSIS — L4050 Arthropathic psoriasis, unspecified: Secondary | ICD-10-CM | POA: Diagnosis not present

## 2014-02-06 DIAGNOSIS — Z79899 Other long term (current) drug therapy: Secondary | ICD-10-CM | POA: Diagnosis not present

## 2014-02-06 DIAGNOSIS — R1084 Generalized abdominal pain: Secondary | ICD-10-CM | POA: Diagnosis not present

## 2014-02-06 DIAGNOSIS — D529 Folate deficiency anemia, unspecified: Secondary | ICD-10-CM | POA: Diagnosis not present

## 2014-02-07 ENCOUNTER — Other Ambulatory Visit: Payer: Self-pay | Admitting: Internal Medicine

## 2014-02-09 ENCOUNTER — Inpatient Hospital Stay (HOSPITAL_COMMUNITY)
Admission: EM | Admit: 2014-02-09 | Discharge: 2014-02-12 | DRG: 340 | Disposition: A | Discharge status: Home / Self Care | Payer: Medicare Other | Attending: General Surgery | Admitting: General Surgery

## 2014-02-09 ENCOUNTER — Encounter (HOSPITAL_COMMUNITY): Payer: Self-pay | Admitting: Emergency Medicine

## 2014-02-09 ENCOUNTER — Encounter (HOSPITAL_COMMUNITY): Admission: EM | Disposition: A | Discharge status: Home / Self Care | Payer: Self-pay | Source: Home / Self Care

## 2014-02-09 ENCOUNTER — Encounter: Payer: Self-pay | Admitting: Internal Medicine

## 2014-02-09 ENCOUNTER — Telehealth: Payer: Self-pay | Admitting: Internal Medicine

## 2014-02-09 ENCOUNTER — Emergency Department (HOSPITAL_COMMUNITY): Payer: Medicare Other | Admitting: Registered Nurse

## 2014-02-09 ENCOUNTER — Ambulatory Visit (INDEPENDENT_AMBULATORY_CARE_PROVIDER_SITE_OTHER): Payer: Medicare Other | Admitting: Internal Medicine

## 2014-02-09 ENCOUNTER — Encounter (HOSPITAL_COMMUNITY): Payer: Medicare Other | Admitting: Registered Nurse

## 2014-02-09 ENCOUNTER — Emergency Department (HOSPITAL_COMMUNITY): Payer: Medicare Other

## 2014-02-09 VITALS — BP 150/62 | HR 76 | Temp 98.2°F | Resp 16 | Wt 234.0 lb

## 2014-02-09 DIAGNOSIS — K3533 Acute appendicitis with perforation and localized peritonitis, with abscess: Secondary | ICD-10-CM | POA: Diagnosis present

## 2014-02-09 DIAGNOSIS — E039 Hypothyroidism, unspecified: Secondary | ICD-10-CM | POA: Diagnosis present

## 2014-02-09 DIAGNOSIS — Z833 Family history of diabetes mellitus: Secondary | ICD-10-CM

## 2014-02-09 DIAGNOSIS — R197 Diarrhea, unspecified: Secondary | ICD-10-CM

## 2014-02-09 DIAGNOSIS — Z8249 Family history of ischemic heart disease and other diseases of the circulatory system: Secondary | ICD-10-CM

## 2014-02-09 DIAGNOSIS — Z860100 Personal history of colon polyps, unspecified: Secondary | ICD-10-CM

## 2014-02-09 DIAGNOSIS — I6529 Occlusion and stenosis of unspecified carotid artery: Secondary | ICD-10-CM | POA: Diagnosis not present

## 2014-02-09 DIAGNOSIS — E669 Obesity, unspecified: Secondary | ICD-10-CM | POA: Diagnosis present

## 2014-02-09 DIAGNOSIS — Z6832 Body mass index (BMI) 32.0-32.9, adult: Secondary | ICD-10-CM | POA: Diagnosis not present

## 2014-02-09 DIAGNOSIS — Z801 Family history of malignant neoplasm of trachea, bronchus and lung: Secondary | ICD-10-CM | POA: Diagnosis not present

## 2014-02-09 DIAGNOSIS — M1990 Unspecified osteoarthritis, unspecified site: Secondary | ICD-10-CM | POA: Diagnosis not present

## 2014-02-09 DIAGNOSIS — K37 Unspecified appendicitis: Secondary | ICD-10-CM | POA: Diagnosis present

## 2014-02-09 DIAGNOSIS — R1031 Right lower quadrant pain: Secondary | ICD-10-CM | POA: Diagnosis not present

## 2014-02-09 DIAGNOSIS — Z885 Allergy status to narcotic agent status: Secondary | ICD-10-CM | POA: Diagnosis not present

## 2014-02-09 DIAGNOSIS — E1159 Type 2 diabetes mellitus with other circulatory complications: Secondary | ICD-10-CM | POA: Diagnosis not present

## 2014-02-09 DIAGNOSIS — E785 Hyperlipidemia, unspecified: Secondary | ICD-10-CM | POA: Diagnosis present

## 2014-02-09 DIAGNOSIS — E079 Disorder of thyroid, unspecified: Secondary | ICD-10-CM | POA: Diagnosis present

## 2014-02-09 DIAGNOSIS — M109 Gout, unspecified: Secondary | ICD-10-CM | POA: Diagnosis present

## 2014-02-09 DIAGNOSIS — M129 Arthropathy, unspecified: Secondary | ICD-10-CM | POA: Diagnosis present

## 2014-02-09 DIAGNOSIS — I10 Essential (primary) hypertension: Secondary | ICD-10-CM | POA: Diagnosis present

## 2014-02-09 DIAGNOSIS — K3580 Unspecified acute appendicitis: Secondary | ICD-10-CM

## 2014-02-09 DIAGNOSIS — Z823 Family history of stroke: Secondary | ICD-10-CM | POA: Diagnosis not present

## 2014-02-09 DIAGNOSIS — R110 Nausea: Secondary | ICD-10-CM | POA: Diagnosis present

## 2014-02-09 DIAGNOSIS — D509 Iron deficiency anemia, unspecified: Secondary | ICD-10-CM | POA: Diagnosis present

## 2014-02-09 DIAGNOSIS — K35891 Other acute appendicitis without perforation, with gangrene: Secondary | ICD-10-CM | POA: Diagnosis present

## 2014-02-09 DIAGNOSIS — K5520 Angiodysplasia of colon without hemorrhage: Secondary | ICD-10-CM | POA: Diagnosis present

## 2014-02-09 DIAGNOSIS — E119 Type 2 diabetes mellitus without complications: Secondary | ICD-10-CM | POA: Diagnosis present

## 2014-02-09 DIAGNOSIS — Z87891 Personal history of nicotine dependence: Secondary | ICD-10-CM

## 2014-02-09 HISTORY — PX: APPENDECTOMY: SHX54

## 2014-02-09 HISTORY — PX: LAPAROSCOPIC APPENDECTOMY: SHX408

## 2014-02-09 LAB — COMPREHENSIVE METABOLIC PANEL
ALBUMIN: 3.8 g/dL (ref 3.5–5.2)
ALT: 11 U/L (ref 0–53)
ANION GAP: 17 — AB (ref 5–15)
AST: 15 U/L (ref 0–37)
Alkaline Phosphatase: 68 U/L (ref 39–117)
BILIRUBIN TOTAL: 1 mg/dL (ref 0.3–1.2)
BUN: 34 mg/dL — AB (ref 6–23)
CALCIUM: 9.4 mg/dL (ref 8.4–10.5)
CO2: 20 mEq/L (ref 19–32)
CREATININE: 1.28 mg/dL (ref 0.50–1.35)
Chloride: 95 mEq/L — ABNORMAL LOW (ref 96–112)
GFR calc Af Amer: 63 mL/min — ABNORMAL LOW (ref >90)
GFR calc non Af Amer: 55 mL/min — ABNORMAL LOW (ref >90)
Glucose, Bld: 290 mg/dL — ABNORMAL HIGH (ref 70–99)
Potassium: 4 mEq/L (ref 3.7–5.3)
Sodium: 132 mEq/L — ABNORMAL LOW (ref 137–147)
Total Protein: 7.6 g/dL (ref 6.0–8.3)

## 2014-02-09 LAB — URINALYSIS, ROUTINE W REFLEX MICROSCOPIC
Bilirubin Urine: NEGATIVE
Glucose, UA: NEGATIVE mg/dL
Hgb urine dipstick: NEGATIVE
KETONES UR: NEGATIVE mg/dL
LEUKOCYTES UA: NEGATIVE
NITRITE: NEGATIVE
Protein, ur: NEGATIVE mg/dL
Specific Gravity, Urine: 1.022 (ref 1.005–1.030)
Urobilinogen, UA: 0.2 mg/dL (ref 0.0–1.0)
pH: 5 (ref 5.0–8.0)

## 2014-02-09 LAB — CBC
HEMATOCRIT: 28.5 % — AB (ref 39.0–52.0)
Hemoglobin: 10.2 g/dL — ABNORMAL LOW (ref 13.0–17.0)
MCH: 31.4 pg (ref 26.0–34.0)
MCHC: 35.8 g/dL (ref 30.0–36.0)
MCV: 87.7 fL (ref 78.0–100.0)
Platelets: 174 10*3/uL (ref 150–400)
RBC: 3.25 MIL/uL — ABNORMAL LOW (ref 4.22–5.81)
RDW: 17.7 % — ABNORMAL HIGH (ref 11.5–15.5)
WBC: 5.6 10*3/uL (ref 4.0–10.5)

## 2014-02-09 LAB — GLUCOSE, POCT (MANUAL RESULT ENTRY): POC Glucose: 300 mg/dl — AB (ref 70–99)

## 2014-02-09 LAB — GLUCOSE, CAPILLARY: Glucose-Capillary: 279 mg/dL — ABNORMAL HIGH (ref 70–99)

## 2014-02-09 SURGERY — APPENDECTOMY, LAPAROSCOPIC
Anesthesia: General | Site: Abdomen

## 2014-02-09 MED ORDER — SODIUM CHLORIDE 0.9 % IV SOLN
INTRAVENOUS | Status: DC
  Administered 2014-02-10: 01:00:00 via INTRAVENOUS

## 2014-02-09 MED ORDER — FENTANYL CITRATE 0.05 MG/ML IJ SOLN
INTRAMUSCULAR | Status: AC
  Filled 2014-02-09: qty 5

## 2014-02-09 MED ORDER — FENTANYL CITRATE 0.05 MG/ML IJ SOLN
INTRAMUSCULAR | Status: DC | PRN
  Administered 2014-02-09: 100 ug via INTRAVENOUS

## 2014-02-09 MED ORDER — ONDANSETRON HCL 4 MG/2ML IJ SOLN
INTRAMUSCULAR | Status: AC
  Filled 2014-02-09: qty 2

## 2014-02-09 MED ORDER — IOHEXOL 300 MG/ML  SOLN
100.0000 mL | Freq: Once | INTRAMUSCULAR | Status: AC | PRN
  Administered 2014-02-09: 100 mL via INTRAVENOUS

## 2014-02-09 MED ORDER — INSULIN ASPART 100 UNIT/ML ~~LOC~~ SOLN
SUBCUTANEOUS | Status: AC
  Filled 2014-02-09: qty 1

## 2014-02-09 MED ORDER — KETOROLAC TROMETHAMINE 30 MG/ML IJ SOLN: 15.0000 mg | Freq: Once | INTRAMUSCULAR | Status: AC | PRN

## 2014-02-09 MED ORDER — IOHEXOL 300 MG/ML  SOLN
50.0000 mL | Freq: Once | INTRAMUSCULAR | Status: AC | PRN
  Administered 2014-02-09: 50 mL via ORAL

## 2014-02-09 MED ORDER — INSULIN ASPART 100 UNIT/ML ~~LOC~~ SOLN
0.0000 [IU] | Freq: Every day | SUBCUTANEOUS | Status: DC
  Administered 2014-02-09: 3 [IU] via SUBCUTANEOUS
  Administered 2014-02-10: 2 [IU] via SUBCUTANEOUS

## 2014-02-09 MED ORDER — FENTANYL CITRATE 0.05 MG/ML IJ SOLN: 25.0000 ug | INTRAMUSCULAR | Status: DC | PRN

## 2014-02-09 MED ORDER — INSULIN ASPART 100 UNIT/ML ~~LOC~~ SOLN
SUBCUTANEOUS | Status: DC | PRN
  Administered 2014-02-09: 5 [IU] via SUBCUTANEOUS

## 2014-02-09 MED ORDER — PROPOFOL 10 MG/ML IV BOLUS
INTRAVENOUS | Status: AC
  Filled 2014-02-09: qty 20

## 2014-02-09 MED ORDER — INSULIN ASPART 100 UNIT/ML ~~LOC~~ SOLN: 5.0000 [IU] | Freq: Once | SUBCUTANEOUS | Status: DC

## 2014-02-09 MED ORDER — NEOSTIGMINE METHYLSULFATE 10 MG/10ML IV SOLN
INTRAVENOUS | Status: AC
  Filled 2014-02-09: qty 1

## 2014-02-09 MED ORDER — LACTATED RINGERS IV SOLN
INTRAVENOUS | Status: DC | PRN
  Administered 2014-02-09 (×2): via INTRAVENOUS

## 2014-02-09 MED ORDER — SODIUM CHLORIDE 0.9 % IV SOLN
INTRAVENOUS | Status: AC
  Filled 2014-02-09: qty 1

## 2014-02-09 MED ORDER — BUPIVACAINE HCL (PF) 0.25 % IJ SOLN
INTRAMUSCULAR | Status: AC
  Filled 2014-02-09: qty 30

## 2014-02-09 MED ORDER — GLYCOPYRROLATE 0.2 MG/ML IJ SOLN
INTRAMUSCULAR | Status: DC | PRN
  Administered 2014-02-09: 0.6 mg via INTRAVENOUS

## 2014-02-09 MED ORDER — BUPIVACAINE-EPINEPHRINE 0.5% -1:200000 IJ SOLN
INTRAMUSCULAR | Status: AC
  Filled 2014-02-09: qty 1

## 2014-02-09 MED ORDER — PROMETHAZINE HCL 25 MG/ML IJ SOLN
INTRAMUSCULAR | Status: AC
  Filled 2014-02-09: qty 1

## 2014-02-09 MED ORDER — PREDNISONE 5 MG PO TABS: 5.0000 mg | ORAL_TABLET | Freq: Every morning | ORAL | Status: DC

## 2014-02-09 MED ORDER — GLYCOPYRROLATE 0.2 MG/ML IJ SOLN
INTRAMUSCULAR | Status: AC
  Filled 2014-02-09: qty 2

## 2014-02-09 MED ORDER — MIDAZOLAM HCL 5 MG/5ML IJ SOLN
INTRAMUSCULAR | Status: DC | PRN
  Administered 2014-02-09: 1 mg via INTRAVENOUS

## 2014-02-09 MED ORDER — PROMETHAZINE HCL 25 MG/ML IJ SOLN
6.2500 mg | INTRAMUSCULAR | Status: DC | PRN
  Administered 2014-02-09: 6.25 mg via INTRAVENOUS

## 2014-02-09 MED ORDER — SODIUM CHLORIDE 0.9 % IV SOLN
1.0000 g | INTRAVENOUS | Status: AC
  Administered 2014-02-09: 1 g via INTRAVENOUS
  Filled 2014-02-09: qty 1

## 2014-02-09 MED ORDER — SUCCINYLCHOLINE CHLORIDE 20 MG/ML IJ SOLN
INTRAMUSCULAR | Status: DC | PRN
  Administered 2014-02-09: 100 mg via INTRAVENOUS

## 2014-02-09 MED ORDER — ROCURONIUM BROMIDE 100 MG/10ML IV SOLN
INTRAVENOUS | Status: DC | PRN
  Administered 2014-02-09: 5 mg via INTRAVENOUS
  Administered 2014-02-09: 30 mg via INTRAVENOUS

## 2014-02-09 MED ORDER — LIDOCAINE HCL (CARDIAC) 20 MG/ML IV SOLN
INTRAVENOUS | Status: DC | PRN
  Administered 2014-02-09: 75 mg via INTRAVENOUS
  Administered 2014-02-09: 25 mg via INTRATRACHEAL

## 2014-02-09 MED ORDER — ONDANSETRON HCL 4 MG/2ML IJ SOLN
4.0000 mg | Freq: Once | INTRAMUSCULAR | Status: AC
  Administered 2014-02-09: 4 mg via INTRAVENOUS
  Filled 2014-02-09: qty 2

## 2014-02-09 MED ORDER — NEOSTIGMINE METHYLSULFATE 10 MG/10ML IV SOLN
INTRAVENOUS | Status: DC | PRN
  Administered 2014-02-09: 4 mg via INTRAVENOUS

## 2014-02-09 MED ORDER — PROPOFOL 10 MG/ML IV BOLUS
INTRAVENOUS | Status: DC | PRN
  Administered 2014-02-09: 200 mg via INTRAVENOUS

## 2014-02-09 MED ORDER — BUPIVACAINE HCL 0.25 % IJ SOLN
INTRAMUSCULAR | Status: DC | PRN
  Administered 2014-02-09: 10 mL

## 2014-02-09 MED ORDER — ONDANSETRON HCL 4 MG/2ML IJ SOLN
INTRAMUSCULAR | Status: DC | PRN
  Administered 2014-02-09: 4 mg via INTRAVENOUS

## 2014-02-09 MED ORDER — MEPERIDINE HCL 50 MG/ML IJ SOLN: 6.2500 mg | INTRAMUSCULAR | Status: DC | PRN

## 2014-02-09 MED ORDER — MIDAZOLAM HCL 2 MG/2ML IJ SOLN
INTRAMUSCULAR | Status: AC
  Filled 2014-02-09: qty 2

## 2014-02-09 MED ORDER — HYDROMORPHONE HCL PF 1 MG/ML IJ SOLN
1.0000 mg | Freq: Once | INTRAMUSCULAR | Status: AC
  Administered 2014-02-09: 1 mg via INTRAVENOUS
  Filled 2014-02-09: qty 1

## 2014-02-09 MED ORDER — ROCURONIUM BROMIDE 100 MG/10ML IV SOLN
INTRAVENOUS | Status: AC
  Filled 2014-02-09: qty 1

## 2014-02-09 MED ORDER — LIDOCAINE HCL (CARDIAC) 20 MG/ML IV SOLN
INTRAVENOUS | Status: AC
  Filled 2014-02-09: qty 5

## 2014-02-09 MED ORDER — INSULIN ASPART 100 UNIT/ML ~~LOC~~ SOLN
0.0000 [IU] | Freq: Three times a day (TID) | SUBCUTANEOUS | Status: DC
  Administered 2014-02-10: 11 [IU] via SUBCUTANEOUS
  Administered 2014-02-10: 4 [IU] via SUBCUTANEOUS
  Administered 2014-02-10: 7 [IU] via SUBCUTANEOUS
  Administered 2014-02-11 (×2): 4 [IU] via SUBCUTANEOUS
  Administered 2014-02-12: 3 [IU] via SUBCUTANEOUS

## 2014-02-09 MED ORDER — SODIUM CHLORIDE 0.9 % IV BOLUS (SEPSIS)
1000.0000 mL | Freq: Once | INTRAVENOUS | Status: AC
  Administered 2014-02-09: 1000 mL via INTRAVENOUS

## 2014-02-09 SURGICAL SUPPLY — 47 items
ADH SKN CLS APL DERMABOND .7 (GAUZE/BANDAGES/DRESSINGS) ×1
APPLIER CLIP 5 13 M/L LIGAMAX5 (MISCELLANEOUS)
APPLIER CLIP ROT 10 11.4 M/L (STAPLE)
APR CLP MED LRG 11.4X10 (STAPLE)
APR CLP MED LRG 5 ANG JAW (MISCELLANEOUS)
BAG SPEC RTRVL LRG 6X4 10 (ENDOMECHANICALS) ×1
CABLE HI FREQUENCY MONOPOLAR (ELECTROSURGICAL) IMPLANT
CANISTER SUCTION 2500CC (MISCELLANEOUS) ×3 IMPLANT
CHLORAPREP W/TINT 26ML (MISCELLANEOUS) ×3 IMPLANT
CLIP APPLIE 5 13 M/L LIGAMAX5 (MISCELLANEOUS) IMPLANT
CLIP APPLIE ROT 10 11.4 M/L (STAPLE) IMPLANT
CUTTER FLEX LINEAR 45M (STAPLE) ×2 IMPLANT
DECANTER SPIKE VIAL GLASS SM (MISCELLANEOUS) ×3 IMPLANT
DERMABOND ADVANCED (GAUZE/BANDAGES/DRESSINGS) ×2
DERMABOND ADVANCED .7 DNX12 (GAUZE/BANDAGES/DRESSINGS) ×1 IMPLANT
DRAIN CHANNEL 19F RND (DRAIN) ×2 IMPLANT
DRAPE LAPAROSCOPIC ABDOMINAL (DRAPES) ×3 IMPLANT
DRAPE UTILITY XL STRL (DRAPES) ×3 IMPLANT
ELECT REM PT RETURN 9FT ADLT (ELECTROSURGICAL) ×3
ELECTRODE REM PT RTRN 9FT ADLT (ELECTROSURGICAL) ×1 IMPLANT
EVACUATOR SILICONE 100CC (DRAIN) ×2 IMPLANT
GLOVE BIO SURGEON STRL SZ 6.5 (GLOVE) ×2 IMPLANT
GLOVE BIO SURGEONS STRL SZ 6.5 (GLOVE) ×1
GLOVE BIOGEL PI IND STRL 7.0 (GLOVE) ×1 IMPLANT
GLOVE BIOGEL PI INDICATOR 7.0 (GLOVE) ×2
GOWN SPEC L4 XLG W/TWL (GOWN DISPOSABLE) ×6 IMPLANT
KIT BASIN OR (CUSTOM PROCEDURE TRAY) ×3 IMPLANT
POUCH SPECIMEN RETRIEVAL 10MM (ENDOMECHANICALS) ×3 IMPLANT
RELOAD 45 VASCULAR/THIN (ENDOMECHANICALS) IMPLANT
RELOAD STAPLE 45 2.5 WHT GRN (ENDOMECHANICALS) IMPLANT
RELOAD STAPLE 45 3.5 BLU ETS (ENDOMECHANICALS) IMPLANT
RELOAD STAPLE TA45 3.5 REG BLU (ENDOMECHANICALS) ×3 IMPLANT
SET IRRIG TUBING LAPAROSCOPIC (IRRIGATION / IRRIGATOR) ×3 IMPLANT
SHEARS HARMONIC ACE PLUS 36CM (ENDOMECHANICALS) IMPLANT
SOLUTION ANTI FOG 6CC (MISCELLANEOUS) ×3 IMPLANT
SPONGE GAUZE 4X4 12PLY (GAUZE/BANDAGES/DRESSINGS) ×2 IMPLANT
SUT ETHILON 2 0 PS N (SUTURE) ×2 IMPLANT
SUT VIC AB 4-0 PS2 27 (SUTURE) ×3 IMPLANT
TAPE CLOTH SURG 4X10 WHT LF (GAUZE/BANDAGES/DRESSINGS) ×2 IMPLANT
TOWEL OR 17X26 10 PK STRL BLUE (TOWEL DISPOSABLE) ×3 IMPLANT
TOWEL OR NON WOVEN STRL DISP B (DISPOSABLE) ×3 IMPLANT
TRAY FOLEY CATH 14FRSI W/METER (CATHETERS) ×3 IMPLANT
TRAY LAP CHOLE (CUSTOM PROCEDURE TRAY) ×3 IMPLANT
TROCAR BLADELESS OPT 5 75 (ENDOMECHANICALS) ×6 IMPLANT
TROCAR XCEL 12X100 BLDLESS (ENDOMECHANICALS) IMPLANT
TROCAR XCEL BLUNT TIP 100MML (ENDOMECHANICALS) ×3 IMPLANT
TUBING INSUFFLATION 10FT LAP (TUBING) ×3 IMPLANT

## 2014-02-09 NOTE — Assessment & Plan Note (Signed)
Worse CBG 300 now Go to ER

## 2014-02-09 NOTE — Anesthesia Preprocedure Evaluation (Signed)
Anesthesia Evaluation  Patient identified by MRN, date of birth, ID band Patient awake    Reviewed: Allergy & Precautions, H&P , NPO status , Patient's Chart, lab work & pertinent test results  Airway Mallampati: II TM Distance: >3 FB Neck ROM: full    Dental no notable dental hx.    Pulmonary neg pulmonary ROS, former smoker,    Pulmonary exam normal       Cardiovascular Exercise Tolerance: Good hypertension, Pt. on medications + Peripheral Vascular Disease  Echo and carotid dopplers reviewed.   Neuro/Psych negative psych ROS   GI/Hepatic negative GI ROS, Neg liver ROS, GERD-  Medicated,  Endo/Other  diabetes, Poorly Controlled, Type 2, Oral Hypoglycemic Agents  Renal/GU negative Renal ROS  negative genitourinary   Musculoskeletal   Abdominal (+) + obese,   Peds  Hematology   Anesthesia Other Findings   Reproductive/Obstetrics negative OB ROS                           Anesthesia Physical Anesthesia Plan  ASA: II  Anesthesia Plan: General   Post-op Pain Management:    Induction: Intravenous, Rapid sequence and Cricoid pressure planned  Airway Management Planned: Oral ETT  Additional Equipment:   Intra-op Plan:   Post-operative Plan: Extubation in OR  Informed Consent: I have reviewed the patients History and Physical, chart, labs and discussed the procedure including the risks, benefits and alternatives for the proposed anesthesia with the patient or authorized representative who has indicated his/her understanding and acceptance.   Dental Advisory Given  Plan Discussed with: CRNA and Surgeon  Anesthesia Plan Comments:         Anesthesia Quick Evaluation

## 2014-02-09 NOTE — Progress Notes (Signed)
Pre visit review using our clinic review tool, if applicable. No additional management support is needed unless otherwise documented below in the visit note. 

## 2014-02-09 NOTE — Anesthesia Postprocedure Evaluation (Signed)
Anesthesia Post Note  Patient: Michael [name]  Procedure(s) Performed: Procedure(s) (LRB): APPENDECTOMY LAPAROSCOPIC (N/A)  Anesthesia type: General  Patient location: PACU  Post pain: Pain level controlled  Post assessment: Post-op Vital signs reviewed  Last Vitals:  Filed Vitals:   02/09/14 2230  BP:   Pulse:   Temp:   Resp: 12    Post vital signs: Reviewed  Level of consciousness: sedated  Complications: No apparent anesthesia complications

## 2014-02-09 NOTE — Progress Notes (Signed)
Patient ID: Michael [name], male   DOB: 1941/03/06, 73 y.o.   MRN: [id number]   Subjective:     Abdominal Pain This is a new problem. The current episode started in the past 7 days. The onset quality is gradual. The pain is located in the generalized abdominal region. The pain is severe. The quality of the pain is cramping. The abdominal pain radiates to the RLQ. Associated symptoms include anorexia and diarrhea. Pertinent negatives include no dysuria, frequency, hematuria, nausea, vomiting or weight loss. The pain is aggravated by bowel movement. The treatment provided no relief.  CBG's 200-300  F/u HTN, PVD, DM, hypothyroidism, Vit D def F/u occip HAs - better  BP Readings from Last 3 Encounters:  02/09/14 150/62  01/16/14 176/78  11/13/13 126/72   Wt Readings from Last 3 Encounters:  02/09/14 234 lb (106.142 kg)  01/16/14 241 lb 4.8 oz (109.453 kg)  11/13/13 246 lb (111.585 kg)   Past Medical History  Diagnosis Date  . Diabetes mellitus   . Hypertension   . Hyperlipidemia   . Gout   . Arthritis   . Thyroid disease     hypothyroidism  . Personal history of colonic polyps 2007, 2008    adenoma each time, largest 12 mm in 2007  . Iron deficiency anemia   . AVM (arteriovenous malformation) of colon   . Allergy   . H/O transfusion of whole blood    Past Surgical History  Procedure Laterality Date  . Rotator cuff repair      left  . Knee arthroscopy      right  . Colonoscopy w/ polypectomy  04/09/2006    12 mm adenoma  . Colonoscopy w/ polypectomy  06/17/2007    5 mm adenoma  . Colonoscopy  02/10/2011    internal hemorrhoids  . Esophagogastroduodenoscopy  12/23/2011    Procedure: ESOPHAGOGASTRODUODENOSCOPY (EGD);  Surgeon: Irene Shipper, MD;  Location: [facility];  Service: Endoscopy;  Laterality: N/A;  with small bowel bx's  . Givens capsule study  12/28/2011    reports that he quit smoking about 7 years ago. He has never used smokeless tobacco. He reports that he drinks  about .6 ounces of alcohol per week. He reports that he does not use illicit drugs. family history includes Diabetes in his father; Heart attack in his father; Hypertension in his father and mother; Lung cancer in his father; Stroke in his father and mother. There is no history of Malignant hyperthermia. Allergies  Allergen Reactions  . Percocet [Oxycodone-Acetaminophen] Nausea And Vomiting    Current Outpatient Prescriptions on File Prior to Visit  Medication Sig Dispense Refill  . acetaminophen (TYLENOL) 650 MG CR tablet Take 650 mg by mouth every 8 (eight) hours as needed. Take two tablets by mouth as needed      . allopurinol (ZYLOPRIM) 100 MG tablet Take 200 mg by mouth every morning.       . carvedilol (COREG) 12.5 MG tablet Take 1 tablet (12.5 mg total) by mouth 2 (two) times daily with a meal.  60 tablet  11  . cholecalciferol (VITAMIN D) 1000 UNITS tablet Take 1 tablet (1,000 Units total) by mouth daily.  100 tablet  3  . ergocalciferol (VITAMIN D2) 50000 UNITS capsule Take 1 capsule (50,000 Units total) by mouth once a week.  6 capsule  0  . ferrous sulfate 325 (65 FE) MG tablet Take 325 mg by mouth 3 (three) times daily with meals.      [name]  gemfibrozil (LOPID) 600 MG tablet Take 600 mg by mouth 2 (two) times daily before a meal.      . glipiZIDE (GLUCOTROL XL) 10 MG 24 hr tablet Take 10 mg by mouth every morning.       [name] glucose blood (ACCU-CHEK COMPACT STRIPS) test strip Use twice daily as instructed. Dx: 250.00, 401.1  100 each  3  . KRILL OIL 1000 MG CAPS Take 1 capsule by mouth every morning.      [name] levothyroxine (SYNTHROID, LEVOTHROID) 150 MCG tablet TAKE 1 TABLET BY MOUTH EVERY DAY BEFORE BREAKFAST  90 tablet  1  . lisinopril (PRINIVIL,ZESTRIL) 20 MG tablet Take 20 mg by mouth every morning.       . metFORMIN (GLUCOPHAGE) 1000 MG tablet Take 1 tablet (1,000 mg total) by mouth 2 (two) times daily with a meal.  180 tablet  0  . pravastatin (PRAVACHOL) 10 MG tablet Take 10 mg by  mouth at bedtime.      . predniSONE (DELTASONE) 5 MG tablet Take 5 mg by mouth every morning.       . methotrexate (RHEUMATREX) 2.5 MG tablet Take 2.5 mg by mouth 4 (four) times a week.       [name] omeprazole (PRILOSEC) 40 MG capsule TAKE ONE CAPSULE BY MOUTH M EVERY DAY  90 capsule  0   No current facility-administered medications on file prior to visit.     Review of Systems  Constitutional: Positive for fatigue and unexpected weight change. Negative for chills, weight loss and appetite change.  HENT: Negative for congestion, nosebleeds, sneezing, sore throat and trouble swallowing.   Eyes: Negative for itching and visual disturbance.  Respiratory: Negative for cough.   Cardiovascular: Negative for chest pain, palpitations and leg swelling.  Gastrointestinal: Positive for abdominal pain, diarrhea and anorexia. Negative for nausea, vomiting, blood in stool, abdominal distention and rectal pain.  Endocrine: Positive for polyuria.  Genitourinary: Negative for dysuria, urgency, frequency, hematuria and testicular pain.  Musculoskeletal: Negative for back pain, gait problem, joint swelling and neck pain.  Skin: Negative for rash.  Neurological: Negative for dizziness, tremors, speech difficulty and weakness.  Psychiatric/Behavioral: Negative for sleep disturbance, dysphoric mood and agitation. The patient is not nervous/anxious.        Objective:   Physical Exam  Constitutional: He is oriented to person, place, and time. He appears well-developed. No distress.  NAD Looks tired  HENT:  Mouth/Throat: No oropharyngeal exudate.  HEENT - dryish  Eyes: Conjunctivae are normal. Pupils are equal, round, and reactive to light.  Neck: Normal range of motion. No JVD present. No thyromegaly present.  Cardiovascular: Normal rate, regular rhythm, normal heart sounds and intact distal pulses.  Exam reveals no gallop and no friction rub.   No murmur heard. Pulmonary/Chest: Effort normal and breath  sounds normal. No respiratory distress. He has no wheezes. He has no rales. He exhibits no tenderness.  Abdominal: Soft. Bowel sounds are normal. He exhibits distension. He exhibits no mass. There is tenderness (+/- rebound). There is rebound. There is no guarding.  RLQ  Musculoskeletal: Normal range of motion. He exhibits no edema and no tenderness.  Lymphadenopathy:    He has no cervical adenopathy.  Neurological: He is alert and oriented to person, place, and time. He has normal reflexes. No cranial nerve deficit. He exhibits normal muscle tone. He displays a negative Romberg sign. Coordination and gait normal.  No meningeal signs  Skin: Skin is warm and dry. No rash noted.  Psychiatric: He has a normal mood and affect. His behavior is normal. Judgment and thought content normal.     Lab Results  Component Value Date   WBC 3.8* 01/16/2014   HGB 11.5* 01/16/2014   HCT 33.5* 01/16/2014   PLT 161 01/16/2014   GLUCOSE 230* 01/16/2014   CHOL 149 11/07/2013   TRIG 643.0* 11/07/2013   HDL 27.70* 11/07/2013   LDLDIRECT 55.8 05/16/2013   LDLCALC -7* 11/07/2013   ALT 8 01/16/2014   AST 9 01/16/2014   NA 140 01/16/2014   K 4.1 01/16/2014   CL 104 01/12/2013   CREATININE 1.1 01/16/2014   BUN 15.5 01/16/2014   CO2 25 01/16/2014   TSH 1.23 11/07/2013   PSA 0.50 11/07/2013   INR 1.02 08/28/2009   HGBA1C 6.2 11/07/2013   MICROALBUR 1.2 05/16/2013        Assessment & Plan:

## 2014-02-09 NOTE — Telephone Encounter (Signed)
Patient's wife reports that the patient is having cramping and abdominal pain.  His wife also reports that the patient is not with her, but she relays nausea,  cramping diarrhea and constipation.  He is a diabetic and BS are greater than 350 for the last few days.  I have given him an appt for 02/15/14, but asked in the meantime he see his primary care about uncontrolled blood sugars.

## 2014-02-09 NOTE — ED Notes (Signed)
Pt given urinal and made aware of need for urine sample. 

## 2014-02-09 NOTE — ED Notes (Signed)
Patient transported to CT 

## 2014-02-09 NOTE — Addendum Note (Signed)
Addended by: Cresenciano Lick on: 02/09/2014 04:38 PM   Modules accepted: Orders

## 2014-02-09 NOTE — ED Notes (Signed)
Bed: [id number] Expected date:  Expected time:  Means of arrival:  Comments: EMS-found on floor

## 2014-02-09 NOTE — Assessment & Plan Note (Signed)
7/15 acute - hold MTX Pt was sent to ER now

## 2014-02-09 NOTE — ED Notes (Signed)
Pt sent here from PCP to r/o appendicitis. Pt c/o of mid-line abdominal pain that radiates to RLQ, tender to palpation, onset Monday. Pt also c/o diarrea onset Tuesday. Denies N/V but reports decreased appetite.

## 2014-02-09 NOTE — Patient Instructions (Signed)
Go to ER now

## 2014-02-09 NOTE — ED Provider Notes (Signed)
CSN: [id number]     Arrival date & time 02/09/14  1645 History   First MD Initiated Contact with Patient 02/09/14 1649     Chief Complaint  Patient presents with  . Abdominal Pain  . R/O appendicitis      (Consider location/radiation/quality/duration/timing/severity/associated sxs/prior Treatment) HPI Comments: 73 year old male with a past medical history of diabetes, hypertension, hyperlipidemia, gout, thyroid disease, colonic polyps, iron deficiency anemia and colon AVM presents to the emergency department from his primary care physician's office for further evaluation of right lower quadrant abdominal pain. Patient started experiencing right lower quadrant abdominal pain 5 days ago, gradually worsening. Pain worse when he lays flat, radiating towards his back, slightly relieved when he lays on his right side. States he's had nonbloody diarrhea for 4 days, describes it as a watery appearance. Admits to nausea without emesis. He's had a slight decreased appetite and is starting to feel weak and fatigued. Denies fever or chills. 4 days ago he noticed his sugar was elevated and is urinating more frequently. Currently denies increased urinary frequency, urgency or dysuria. Denies history of abdominal surgeries, however has had 3 colonoscopies were adenomatous polyps have been found. Denies fever or chills. His PCP was concerned of possible appendicitis and requested patient be transferred to the emergency department for labs and a CT scan.  Patient is a 73 y.o. male presenting with abdominal pain. The history is provided by the patient, medical records and the spouse.  Abdominal Pain Associated symptoms: diarrhea, fatigue and nausea     Past Medical History  Diagnosis Date  . Diabetes mellitus   . Hypertension   . Hyperlipidemia   . Gout   . Arthritis   . Thyroid disease     hypothyroidism  . Personal history of colonic polyps 2007, 2008    adenoma each time, largest 12 mm in 2007  . Iron  deficiency anemia   . AVM (arteriovenous malformation) of colon   . Allergy   . H/O transfusion of whole blood    Past Surgical History  Procedure Laterality Date  . Rotator cuff repair      left  . Knee arthroscopy      right  . Colonoscopy w/ polypectomy  04/09/2006    12 mm adenoma  . Colonoscopy w/ polypectomy  06/17/2007    5 mm adenoma  . Colonoscopy  02/10/2011    internal hemorrhoids  . Esophagogastroduodenoscopy  12/23/2011    Procedure: ESOPHAGOGASTRODUODENOSCOPY (EGD);  Surgeon: Irene Shipper, MD;  Location: [facility];  Service: Endoscopy;  Laterality: N/A;  with small bowel bx's  . Givens capsule study  12/28/2011   Family History  Problem Relation Age of Onset  . Malignant hyperthermia Neg Hx   . Heart attack Father   . Lung cancer Father   . Diabetes Father   . Stroke Father   . Hypertension Father   . Stroke Mother   . Hypertension Mother    History  Substance Use Topics  . Smoking status: Former Smoker    Quit date: 03/29/2006  . Smokeless tobacco: Never Used  . Alcohol Use: 0.6 oz/week    1 Cans of beer per week     Comment: occasional alcohol intake    Review of Systems  Constitutional: Positive for appetite change and fatigue.  Gastrointestinal: Positive for nausea, abdominal pain and diarrhea.  Neurological: Positive for weakness.  All other systems reviewed and are negative.     Allergies  Percocet  Home Medications  Prior to Admission medications   Medication Sig Start Date End Date Taking? Authorizing Provider  acetaminophen (TYLENOL) 650 MG CR tablet Take 650 mg by mouth every 8 (eight) hours as needed. Take two tablets by mouth as needed    Historical Provider, MD  allopurinol (ZYLOPRIM) 100 MG tablet Take 200 mg by mouth every morning.     Historical Provider, MD  carvedilol (COREG) 12.5 MG tablet Take 1 tablet (12.5 mg total) by mouth 2 (two) times daily with a meal. 11/06/13   Aleksei [name] V, MD  cholecalciferol (VITAMIN D)  1000 UNITS tablet Take 1 tablet (1,000 Units total) by mouth daily. 11/06/13 11/06/14  Aleksei [name] V, MD  ergocalciferol (VITAMIN D2) 50000 UNITS capsule Take 1 capsule (50,000 Units total) by mouth once a week. 11/08/13   Aleksei [name] V, MD  ferrous sulfate 325 (65 FE) MG tablet Take 325 mg by mouth 3 (three) times daily with meals.    Historical Provider, MD  gemfibrozil (LOPID) 600 MG tablet Take 600 mg by mouth 2 (two) times daily before a meal.    Historical Provider, MD  glipiZIDE (GLUCOTROL XL) 10 MG 24 hr tablet Take 10 mg by mouth every morning.     Historical Provider, MD  glucose blood (ACCU-CHEK COMPACT STRIPS) test strip Use twice daily as instructed. Dx: 250.00, 401.1 01/10/14   Aleksei [name] V, MD  levothyroxine (SYNTHROID, LEVOTHROID) 150 MCG tablet TAKE 1 TABLET BY MOUTH EVERY DAY BEFORE BREAKFAST    Aleksei [name] V, MD  lisinopril (PRINIVIL,ZESTRIL) 20 MG tablet Take 20 mg by mouth every morning.     Historical Provider, MD  metFORMIN (GLUCOPHAGE) 1000 MG tablet Take 1 tablet (1,000 mg total) by mouth 2 (two) times daily with a meal. 07/10/13   Webb Silversmith, NP  omeprazole (PRILOSEC) 40 MG capsule TAKE ONE CAPSULE BY MOUTH M EVERY DAY 10/04/13   Gatha Mayer, MD  predniSONE (DELTASONE) 5 MG tablet Take 5 mg by mouth every morning.     Historical Provider, MD   BP 164/75  Pulse 77  Temp(Src) 98.1 F (36.7 C) (Oral)  Resp 20  Ht 5\' 11"  (1.803 m)  Wt 234 lb (106.142 kg)  BMI 32.65 kg/m2  SpO2 100% Physical Exam  Nursing note and vitals reviewed. Constitutional: He is oriented to person, place, and time. He appears well-developed and well-nourished. No distress.  HENT:  Head: Normocephalic and atraumatic.  Mouth/Throat: Oropharynx is clear and moist.  Eyes: Conjunctivae are normal. No scleral icterus.  Neck: Normal range of motion. Neck supple.  Cardiovascular: Normal rate, regular rhythm and normal heart sounds.   Pulmonary/Chest: Effort normal and breath  sounds normal.  Abdominal: Soft. Bowel sounds are normal. He exhibits distension (mild). There is tenderness.  TTP peri-umbilical and lower abdomen, most prominent RLQ with guarding and rebound.  Musculoskeletal: Normal range of motion. He exhibits no edema.  Neurological: He is alert and oriented to person, place, and time.  Skin: Skin is warm and dry. He is not diaphoretic.  Psychiatric: He has a normal mood and affect. His behavior is normal.    ED Course  Procedures (including critical care time) Labs Review Labs Reviewed  CBC - Abnormal; Notable for the following:    RBC 3.25 (*)    Hemoglobin 10.2 (*)    HCT 28.5 (*)    RDW 17.7 (*)    All other components within normal limits  COMPREHENSIVE METABOLIC PANEL - Abnormal; Notable for the following:  Sodium 132 (*)    Chloride 95 (*)    Glucose, Bld 290 (*)    BUN 34 (*)    GFR calc non Af Amer 55 (*)    GFR calc Af Amer 63 (*)    Anion gap 17 (*)    All other components within normal limits  URINALYSIS, ROUTINE W REFLEX MICROSCOPIC - Abnormal; Notable for the following:    Color, Urine AMBER (*)    APPearance CLOUDY (*)    All other components within normal limits    Imaging Review Ct Abdomen Pelvis W Contrast  02/09/2014   CLINICAL DATA:  Abdominal pain.  Weakness.  Nausea and vomiting.  EXAM: CT ABDOMEN AND PELVIS WITH CONTRAST  TECHNIQUE: Multidetector CT imaging of the abdomen and pelvis was performed using the standard protocol following bolus administration of intravenous contrast.  CONTRAST:  176mL OMNIPAQUE IOHEXOL 300 MG/ML SOLN, 69mL OMNIPAQUE IOHEXOL 300 MG/ML SOLN  COMPARISON:  08/19/2012  FINDINGS: The appendix is thickened and spaced 1 cm and thickened and cement portion with surrounding significant inflammatory change. There is no extraluminal air and no defined fluid collection is seen to suggest an abscess. A appendix more distally is normal in caliber. Appendix extends posteriorly and inferiorly from the  cecal tip in the right lower quadrant.  Clear lung bases. Heart is normal in size. Liver is mildly enlarged with evidence suggesting fatty infiltration. No liver mass or focal lesion. Spleen is mildly enlarged measuring 15 cm. No splenic mass or focal lesion.  There are gallstones. No acute cholecystitis. No bile duct dilation. Normal pancreas and adrenal glands. Small low-density renal lesions, likely cysts. Kidneys are otherwise unremarkable. No hydronephrosis. Ureters and bladder are unremarkable.  No pathologically enlarged lymph nodes.  No ascites.  Colon and small bowel are unremarkable.  IMPRESSION: 1. Acute appendicitis. There is significant periappendiceal inflammation, but no extraluminal air to suggest perforation and no evidence of an abscess. 2. No other acute findings.   Electronically Signed   By: Lajean Manes M.D.   On: 02/09/2014 19:21     EKG Interpretation None      MDM   Final diagnoses:  Acute appendicitis, unspecified acute appendicitis type   Pt presenting from PCP for further evaluation of abdominal pain. He is non-toxic appearing and in NAD. AFVSS. He has guarding and rebound tenderness, mostly in RLQ. Labs, CT abd/pelvis pending. 7:00 PM Pt now vomiting. Will give zofran. 7:40 PM Patient no longer vomiting. CT scan showing acute appendicitis without abscess or perforation. Patient will be admitted to Gen. surgery, I spoke with Dr. Marcello Moores who will evaluate patient for admission.  Case discussed with attending Dr. Leonides Schanz who also evaluated patient and agrees with plan of care.   Illene Labrador, PA-C 02/09/14 1940

## 2014-02-09 NOTE — Transfer of Care (Signed)
Immediate Anesthesia Transfer of Care Note  Patient: Michael [name]  Procedure(s) Performed: Procedure(s): APPENDECTOMY LAPAROSCOPIC (N/A)  Patient Location: PACU  Anesthesia Type:General  Level of Consciousness: awake, alert , oriented and patient cooperative  Airway & Oxygen Therapy: Patient Spontanous Breathing and Patient connected to face mask oxygen  Post-op Assessment: Report given to PACU RN, Post -op Vital signs reviewed and stable and Patient moving all extremities X 4  Post vital signs: stable  Complications: No apparent anesthesia complications

## 2014-02-09 NOTE — ED Provider Notes (Signed)
Medical screening examination/treatment/procedure(s) were conducted as a shared visit with non-physician practitioner(s) and myself.  I personally evaluated the patient during the encounter.   EKG Interpretation None      Pt is a 73 y.o. male who presents emergency department with several days of right lower quadrant pain, chills and diarrhea. He also has had anorexia. He is exquisitely tender to palpation in his right lower corner and with voluntary guarding. Will obtain abdominal labs, CT imaging to evaluate for colitis versus appendicitis.  [place], DO 02/09/14 1925

## 2014-02-09 NOTE — H&P (Signed)
Michael [name] is an 73 y.o. male.   Chief Complaint: RLQ pain HPI: States vague abd pain for the past 5 days.  Got worse over the last 48h.  Associated with diarrhea and nausea.  Blood sugars have been elevated as well.  Takes prednisone for arthritis.  Hasn't had any in about a week.  Denies chest pain or SOB with exertion.    Past Medical History  Diagnosis Date  . Diabetes mellitus   . Hypertension   . Hyperlipidemia   . Gout   . Arthritis   . Thyroid disease     hypothyroidism  . Personal history of colonic polyps 2007, 2008    adenoma each time, largest 12 mm in 2007  . Iron deficiency anemia   . AVM (arteriovenous malformation) of colon   . Allergy   . H/O transfusion of whole blood     Past Surgical History  Procedure Laterality Date  . Rotator cuff repair      left  . Knee arthroscopy      right  . Colonoscopy w/ polypectomy  04/09/2006    12 mm adenoma  . Colonoscopy w/ polypectomy  06/17/2007    5 mm adenoma  . Colonoscopy  02/10/2011    internal hemorrhoids  . Esophagogastroduodenoscopy  12/23/2011    Procedure: ESOPHAGOGASTRODUODENOSCOPY (EGD);  Surgeon: Irene Shipper, MD;  Location: [facility];  Service: Endoscopy;  Laterality: N/A;  with small bowel bx's  . Givens capsule study  12/28/2011    Family History  Problem Relation Age of Onset  . Malignant hyperthermia Neg Hx   . Heart attack Father   . Lung cancer Father   . Diabetes Father   . Stroke Father   . Hypertension Father   . Stroke Mother   . Hypertension Mother    Social History:  reports that he quit smoking about 7 years ago. He has never used smokeless tobacco. He reports that he drinks about .6 ounces of alcohol per week. He reports that he does not use illicit drugs.  Allergies:  Allergies  Allergen Reactions  . Percocet [Oxycodone-Acetaminophen] Nausea And Vomiting     (Not in a hospital admission)  Results for orders placed during the hospital encounter of 02/09/14 (from the past 48  hour(s))  CBC     Status: Abnormal   Collection Time    02/09/14  5:09 PM      Result Value Ref Range   WBC 5.6  4.0 - 10.5 K/uL   RBC 3.25 (*) 4.22 - 5.81 MIL/uL   Hemoglobin 10.2 (*) 13.0 - 17.0 g/dL   HCT 28.5 (*) 39.0 - 52.0 %   MCV 87.7  78.0 - 100.0 fL   MCH 31.4  26.0 - 34.0 pg   MCHC 35.8  30.0 - 36.0 g/dL   RDW 17.7 (*) 11.5 - 15.5 %   Platelets 174  150 - 400 K/uL  COMPREHENSIVE METABOLIC PANEL     Status: Abnormal   Collection Time    02/09/14  5:09 PM      Result Value Ref Range   Sodium 132 (*) 137 - 147 mEq/L   Potassium 4.0  3.7 - 5.3 mEq/L   Chloride 95 (*) 96 - 112 mEq/L   CO2 20  19 - 32 mEq/L   Glucose, Bld 290 (*) 70 - 99 mg/dL   BUN 34 (*) 6 - 23 mg/dL   Creatinine, Ser 1.28  0.50 - 1.35 mg/dL   Calcium  9.4  8.4 - 10.5 mg/dL   Total Protein 7.6  6.0 - 8.3 g/dL   Albumin 3.8  3.5 - 5.2 g/dL   AST 15  0 - 37 U/L   ALT 11  0 - 53 U/L   Alkaline Phosphatase 68  39 - 117 U/L   Total Bilirubin 1.0  0.3 - 1.2 mg/dL   GFR calc non Af Amer 55 (*) >90 mL/min   GFR calc Af Amer 63 (*) >90 mL/min   Comment: (NOTE)     The eGFR has been calculated using the CKD EPI equation.     This calculation has not been validated in all clinical situations.     eGFR's persistently <90 mL/min signify possible Chronic Kidney     Disease.   Anion gap 17 (*) 5 - 15  URINALYSIS, ROUTINE W REFLEX MICROSCOPIC     Status: Abnormal   Collection Time    02/09/14  6:05 PM      Result Value Ref Range   Color, Urine AMBER (*) YELLOW   Comment: BIOCHEMICALS MAY BE AFFECTED BY COLOR   APPearance CLOUDY (*) CLEAR   Specific Gravity, Urine 1.022  1.005 - 1.030   pH 5.0  5.0 - 8.0   Glucose, UA NEGATIVE  NEGATIVE mg/dL   Hgb urine dipstick NEGATIVE  NEGATIVE   Bilirubin Urine NEGATIVE  NEGATIVE   Ketones, ur NEGATIVE  NEGATIVE mg/dL   Protein, ur NEGATIVE  NEGATIVE mg/dL   Urobilinogen, UA 0.2  0.0 - 1.0 mg/dL   Nitrite NEGATIVE  NEGATIVE   Leukocytes, UA NEGATIVE  NEGATIVE    Comment: MICROSCOPIC NOT DONE ON URINES WITH NEGATIVE PROTEIN, BLOOD, LEUKOCYTES, NITRITE, OR GLUCOSE <1000 mg/dL.   Ct Abdomen Pelvis W Contrast  02/09/2014   CLINICAL DATA:  Abdominal pain.  Weakness.  Nausea and vomiting.  EXAM: CT ABDOMEN AND PELVIS WITH CONTRAST  TECHNIQUE: Multidetector CT imaging of the abdomen and pelvis was performed using the standard protocol following bolus administration of intravenous contrast.  CONTRAST:  190m OMNIPAQUE IOHEXOL 300 MG/ML SOLN, 548mOMNIPAQUE IOHEXOL 300 MG/ML SOLN  COMPARISON:  08/19/2012  FINDINGS: The appendix is thickened and spaced 1 cm and thickened and cement portion with surrounding significant inflammatory change. There is no extraluminal air and no defined fluid collection is seen to suggest an abscess. A appendix more distally is normal in caliber. Appendix extends posteriorly and inferiorly from the cecal tip in the right lower quadrant.  Clear lung bases. Heart is normal in size. Liver is mildly enlarged with evidence suggesting fatty infiltration. No liver mass or focal lesion. Spleen is mildly enlarged measuring 15 cm. No splenic mass or focal lesion.  There are gallstones. No acute cholecystitis. No bile duct dilation. Normal pancreas and adrenal glands. Small low-density renal lesions, likely cysts. Kidneys are otherwise unremarkable. No hydronephrosis. Ureters and bladder are unremarkable.  No pathologically enlarged lymph nodes.  No ascites.  Colon and small bowel are unremarkable.  IMPRESSION: 1. Acute appendicitis. There is significant periappendiceal inflammation, but no extraluminal air to suggest perforation and no evidence of an abscess. 2. No other acute findings.   Electronically Signed   By: DaLajean Manes.D.   On: 02/09/2014 19:21    Review of Systems  Constitutional: Negative for fever and chills.  Eyes: Negative for blurred vision.  Respiratory: Negative for cough and shortness of breath.   Cardiovascular: Negative for chest  pain.  Gastrointestinal: Positive for nausea, abdominal pain and diarrhea. Negative  for vomiting and blood in stool.  Genitourinary: Negative for dysuria, urgency and frequency.  Musculoskeletal: Negative for myalgias.  Neurological: Negative for dizziness, focal weakness, weakness and headaches.    Blood pressure 152/61, pulse 70, temperature 97.5 F (36.4 C), temperature source Oral, resp. rate 18, height _0  (1.803 m), weight 234 lb (106.142 kg), SpO2 98.00%. Physical Exam  Constitutional: He is oriented to person, place, and time. He appears well-developed and well-nourished.  HENT:  Head: Normocephalic and atraumatic.  Eyes: Conjunctivae are normal. Pupils are equal, round, and reactive to light.  Neck: Normal range of motion.  Cardiovascular: Normal rate and regular rhythm.   Respiratory: Effort normal and breath sounds normal.  GI: Soft. Bowel sounds are normal. There is tenderness. There is no rebound and no guarding.  RLQ  Musculoskeletal: Normal range of motion.  Neurological: He is alert and oriented to person, place, and time.  Skin: Skin is warm and dry.     Assessment/Plan Pt with signs and symptoms of appedicitis.  Risks and benefits of intervention discussed with patient.  Risks of surgery include bleeding, infection, leak and hernia.  Pt has agreed to proceed with surgery.    Nirvana Blanchett C. 09/04/1066, 1:66 PM

## 2014-02-09 NOTE — Op Note (Signed)
Michael [name] [id number]   PRE-OPERATIVE DIAGNOSIS:  appendicitis  POST-OPERATIVE DIAGNOSIS:  Gangrenous appendicitis   Procedure(s): APPENDECTOMY LAPAROSCOPIC  SURGEON:  Surgeon(s): Leighton Ruff, MD  ASSISTANT: none   ANESTHESIA:   local and general  EBL:   20ml  Delay start of Pharmacological VTE agent (>24hrs) due to surgical blood loss or risk of bleeding:  no  DRAINS: (60F) Jackson-Pratt drain(s) with closed bulb suction in the RLQ   SPECIMEN:  Source of Specimen:  appendix  DISPOSITION OF SPECIMEN:  PATHOLOGY  COUNTS:  YES  PLAN OF CARE: Admit for overnight observation  PATIENT DISPOSITION:  PACU - hemodynamically stable.   INDICATIONS: Patient with concerning symptoms & work up suspicious for appendicitis.  Surgery was recommended:  The anatomy & physiology of the digestive tract was discussed.  The pathophysiology of appendicitis was discussed.  Natural history risks without surgery was discussed.   I feel the risks of no intervention will lead to serious problems that outweigh the operative risks; therefore, I recommended diagnostic laparoscopy with removal of appendix to remove the pathology.  Laparoscopic & open techniques were discussed.   I noted a good likelihood this will help address the problem.    Risks such as bleeding, infection, abscess, leak, reoperation, possible ostomy, hernia, heart attack, death, and other risks were discussed.  Goals of post-operative recovery were discussed as well.  We will work to minimize complications.  Questions were answered.  The patient expresses understanding & wishes to proceed with surgery.  OR FINDINGS: Partially obliterated appendix with abscess cavity, severe adhesions to abd sidewall  DESCRIPTION:   The patient was identified & brought into the operating room. The patient was positioned supine with left arm tucked. SCDs were active during the entire case. The patient underwent general anesthesia without any  difficulty.  A foley catheter was inserted under sterile conditions. The abdomen was prepped and draped in a sterile fashion. A Surgical Timeout confirmed our plan.   I made a transverse incision through the inferior umbilical fold.  I made a nick in the infraumbilical fascia and confirmed peritoneal entry.  I placed a stay suture and then the [facility] port.  We induced carbon dioxide insufflation.  Camera inspection revealed no injury.  I placed additional ports under direct laparoscopic visualization.  I mobilized the terminal ileum to proximal ascending colon in a lateral to medial fashion.  I took care to avoid injuring any retroperitoneal structures.   I freed the appendix off its attachments to the ascending colon and cecal mesentery.  I elevated the appendix. I was able to free off the base of the appendix which was still viable.  I stapled the appendix off the cecum using a laparoscopic stapler. I took a small cuff of viable cecum.  I placed the appendix inside an EndoCatch bag in pieces and removed out the [place] port.  I did copious irrigation. Hemostasis was good in the mesoappendix, colon mesentery, and retroperitoneum. Staple line was intact on the cecum with no bleeding. I washed out the pelvis, retrohepatic space and right paracolic gutter. I washed out the left side as well.  Hemostasis is good. There was no perforation or injury.  I decided to leave a drain due to perforation of appendix and to monitor for bleeding.  I brought this out through one of the port sites and sutured into place with 2-0 Nylon suture.   I aspirated the carbon dioxide. I removed the ports. I closed the umbilical fascia site using  a 0 Vicryl stitch. I closed skin using 4-0 vicryl stitch.  Sterile dressings were applied.  Patient was extubated and sent to the recovery room.  I discussed the operative findings with the patient's family. I suspect the patient is going used in the hospital at least overnight and will need  antibiotics for a few days. Questions answered. They expressed understanding and appreciation.

## 2014-02-09 NOTE — Assessment & Plan Note (Signed)
7/15 x5d - worse; r/o appendicitis Pt was sent to ER Will needs labs/CT

## 2014-02-09 NOTE — Telephone Encounter (Signed)
Left message for patient to call back  

## 2014-02-10 DIAGNOSIS — K3533 Acute appendicitis with perforation and localized peritonitis, with abscess: Secondary | ICD-10-CM | POA: Diagnosis present

## 2014-02-10 DIAGNOSIS — Z801 Family history of malignant neoplasm of trachea, bronchus and lung: Secondary | ICD-10-CM | POA: Diagnosis not present

## 2014-02-10 DIAGNOSIS — R197 Diarrhea, unspecified: Secondary | ICD-10-CM | POA: Diagnosis present

## 2014-02-10 DIAGNOSIS — Z885 Allergy status to narcotic agent status: Secondary | ICD-10-CM | POA: Diagnosis not present

## 2014-02-10 DIAGNOSIS — E669 Obesity, unspecified: Secondary | ICD-10-CM | POA: Diagnosis present

## 2014-02-10 DIAGNOSIS — K5520 Angiodysplasia of colon without hemorrhage: Secondary | ICD-10-CM | POA: Diagnosis present

## 2014-02-10 DIAGNOSIS — R1031 Right lower quadrant pain: Secondary | ICD-10-CM | POA: Diagnosis not present

## 2014-02-10 DIAGNOSIS — R110 Nausea: Secondary | ICD-10-CM | POA: Diagnosis present

## 2014-02-10 DIAGNOSIS — D509 Iron deficiency anemia, unspecified: Secondary | ICD-10-CM | POA: Diagnosis present

## 2014-02-10 DIAGNOSIS — E785 Hyperlipidemia, unspecified: Secondary | ICD-10-CM | POA: Diagnosis present

## 2014-02-10 DIAGNOSIS — Z87891 Personal history of nicotine dependence: Secondary | ICD-10-CM | POA: Diagnosis not present

## 2014-02-10 DIAGNOSIS — K35891 Other acute appendicitis without perforation, with gangrene: Secondary | ICD-10-CM | POA: Diagnosis present

## 2014-02-10 DIAGNOSIS — E079 Disorder of thyroid, unspecified: Secondary | ICD-10-CM | POA: Diagnosis present

## 2014-02-10 DIAGNOSIS — Z833 Family history of diabetes mellitus: Secondary | ICD-10-CM | POA: Diagnosis not present

## 2014-02-10 DIAGNOSIS — E119 Type 2 diabetes mellitus without complications: Secondary | ICD-10-CM | POA: Diagnosis present

## 2014-02-10 DIAGNOSIS — M109 Gout, unspecified: Secondary | ICD-10-CM | POA: Diagnosis present

## 2014-02-10 DIAGNOSIS — I10 Essential (primary) hypertension: Secondary | ICD-10-CM | POA: Diagnosis present

## 2014-02-10 DIAGNOSIS — Z6832 Body mass index (BMI) 32.0-32.9, adult: Secondary | ICD-10-CM | POA: Diagnosis not present

## 2014-02-10 DIAGNOSIS — Z86010 Personal history of colonic polyps: Secondary | ICD-10-CM | POA: Diagnosis not present

## 2014-02-10 DIAGNOSIS — M129 Arthropathy, unspecified: Secondary | ICD-10-CM | POA: Diagnosis present

## 2014-02-10 DIAGNOSIS — E039 Hypothyroidism, unspecified: Secondary | ICD-10-CM | POA: Diagnosis present

## 2014-02-10 DIAGNOSIS — Z823 Family history of stroke: Secondary | ICD-10-CM | POA: Diagnosis not present

## 2014-02-10 DIAGNOSIS — Z8249 Family history of ischemic heart disease and other diseases of the circulatory system: Secondary | ICD-10-CM | POA: Diagnosis not present

## 2014-02-10 LAB — GLUCOSE, CAPILLARY
GLUCOSE-CAPILLARY: 200 mg/dL — AB (ref 70–99)
GLUCOSE-CAPILLARY: 257 mg/dL — AB (ref 70–99)
Glucose-Capillary: 287 mg/dL — ABNORMAL HIGH (ref 70–99)
Glucose-Capillary: 218 mg/dL — ABNORMAL HIGH (ref 70–99)
Glucose-Capillary: 260 mg/dL — ABNORMAL HIGH (ref 70–99)
Glucose-Capillary: 206 mg/dL — ABNORMAL HIGH (ref 70–99)

## 2014-02-10 LAB — BASIC METABOLIC PANEL
Anion gap: 16 — ABNORMAL HIGH (ref 5–15)
BUN: 26 mg/dL — AB (ref 6–23)
CALCIUM: 8.9 mg/dL (ref 8.4–10.5)
CO2: 22 meq/L (ref 19–32)
Chloride: 96 mEq/L (ref 96–112)
Creatinine, Ser: 1.07 mg/dL (ref 0.50–1.35)
GFR calc Af Amer: 79 mL/min — ABNORMAL LOW (ref >90)
GFR calc non Af Amer: 68 mL/min — ABNORMAL LOW (ref >90)
GLUCOSE: 228 mg/dL — AB (ref 70–99)
Potassium: 4.2 mEq/L (ref 3.7–5.3)
SODIUM: 134 meq/L — AB (ref 137–147)

## 2014-02-10 LAB — CBC
HEMATOCRIT: 27.4 % — AB (ref 39.0–52.0)
Hemoglobin: 9.5 g/dL — ABNORMAL LOW (ref 13.0–17.0)
MCH: 30.9 pg (ref 26.0–34.0)
MCHC: 34.7 g/dL (ref 30.0–36.0)
MCV: 89.3 fL (ref 78.0–100.0)
Platelets: 172 10*3/uL (ref 150–400)
RBC: 3.07 MIL/uL — ABNORMAL LOW (ref 4.22–5.81)
RDW: 17.8 % — ABNORMAL HIGH (ref 11.5–15.5)
WBC: 10.2 10*3/uL (ref 4.0–10.5)

## 2014-02-10 MED ORDER — CARVEDILOL 12.5 MG PO TABS
12.5000 mg | ORAL_TABLET | Freq: Two times a day (BID) | ORAL | Status: DC
  Administered 2014-02-10 – 2014-02-12 (×4): 12.5 mg via ORAL
  Filled 2014-02-10 (×7): qty 1

## 2014-02-10 MED ORDER — LISINOPRIL 20 MG PO TABS
20.0000 mg | ORAL_TABLET | Freq: Every morning | ORAL | Status: DC
  Administered 2014-02-10 – 2014-02-12 (×3): 20 mg via ORAL
  Filled 2014-02-10 (×3): qty 1

## 2014-02-10 MED ORDER — ONDANSETRON HCL 4 MG PO TABS
4.0000 mg | ORAL_TABLET | Freq: Four times a day (QID) | ORAL | Status: DC | PRN
Start: 2014-02-10 — End: 2014-02-12

## 2014-02-10 MED ORDER — ALLOPURINOL 100 MG PO TABS
200.0000 mg | ORAL_TABLET | Freq: Every morning | ORAL | Status: DC
  Administered 2014-02-10 – 2014-02-12 (×3): 200 mg via ORAL
  Filled 2014-02-10 (×3): qty 2

## 2014-02-10 MED ORDER — GEMFIBROZIL 600 MG PO TABS
600.0000 mg | ORAL_TABLET | Freq: Two times a day (BID) | ORAL | Status: DC
  Administered 2014-02-10 – 2014-02-12 (×5): 600 mg via ORAL
  Filled 2014-02-10 (×7): qty 1

## 2014-02-10 MED ORDER — ONDANSETRON HCL 4 MG/2ML IJ SOLN
4.0000 mg | Freq: Four times a day (QID) | INTRAMUSCULAR | Status: DC | PRN
  Administered 2014-02-10: 4 mg via INTRAVENOUS
  Filled 2014-02-10: qty 2

## 2014-02-10 MED ORDER — FERROUS SULFATE 325 (65 FE) MG PO TABS
325.0000 mg | ORAL_TABLET | Freq: Three times a day (TID) | ORAL | Status: DC
  Administered 2014-02-10 – 2014-02-12 (×7): 325 mg via ORAL
  Filled 2014-02-10 (×10): qty 1

## 2014-02-10 MED ORDER — IBUPROFEN 600 MG PO TABS
600.0000 mg | ORAL_TABLET | Freq: Four times a day (QID) | ORAL | Status: DC | PRN
  Filled 2014-02-10 (×2): qty 1

## 2014-02-10 MED ORDER — KCL IN DEXTROSE-NACL 20-5-0.45 MEQ/L-%-% IV SOLN
INTRAVENOUS | Status: DC
  Administered 2014-02-10 (×2): via INTRAVENOUS
  Filled 2014-02-10 (×2): qty 1000

## 2014-02-10 MED ORDER — BIOTENE DRY MOUTH MT LIQD
15.0000 mL | Freq: Two times a day (BID) | OROMUCOSAL | Status: DC
  Administered 2014-02-10 (×2): 15 mL via OROMUCOSAL

## 2014-02-10 MED ORDER — PANTOPRAZOLE SODIUM 40 MG PO TBEC
80.0000 mg | DELAYED_RELEASE_TABLET | Freq: Every day | ORAL | Status: DC
  Administered 2014-02-10 – 2014-02-12 (×3): 80 mg via ORAL
  Filled 2014-02-10 (×3): qty 2

## 2014-02-10 MED ORDER — MORPHINE SULFATE 2 MG/ML IJ SOLN
2.0000 mg | INTRAMUSCULAR | Status: DC | PRN
  Administered 2014-02-10: 4 mg via INTRAVENOUS
  Administered 2014-02-10 (×2): 2 mg via INTRAVENOUS
  Administered 2014-02-10: 4 mg via INTRAVENOUS
  Administered 2014-02-11: 2 mg via INTRAVENOUS
  Filled 2014-02-10: qty 1
  Filled 2014-02-10: qty 2
  Filled 2014-02-10 (×2): qty 1
  Filled 2014-02-10: qty 2

## 2014-02-10 MED ORDER — SODIUM CHLORIDE 0.9 % IV SOLN
1.0000 g | INTRAVENOUS | Status: DC
  Administered 2014-02-10: 1 g via INTRAVENOUS
  Filled 2014-02-10: qty 1

## 2014-02-10 MED ORDER — GLIPIZIDE ER 10 MG PO TB24
10.0000 mg | ORAL_TABLET | Freq: Every day | ORAL | Status: DC
  Administered 2014-02-10 – 2014-02-12 (×3): 10 mg via ORAL
  Filled 2014-02-10 (×5): qty 1

## 2014-02-10 MED ORDER — VITAMIN D3 25 MCG (1000 UNIT) PO TABS
1000.0000 [IU] | ORAL_TABLET | Freq: Every day | ORAL | Status: DC
  Administered 2014-02-10 – 2014-02-12 (×3): 1000 [IU] via ORAL
  Filled 2014-02-10 (×3): qty 1

## 2014-02-10 MED ORDER — LEVOTHYROXINE SODIUM 150 MCG PO TABS
150.0000 ug | ORAL_TABLET | Freq: Every day | ORAL | Status: DC
  Administered 2014-02-10 – 2014-02-12 (×3): 150 ug via ORAL
  Filled 2014-02-10 (×4): qty 1

## 2014-02-10 MED ORDER — ENOXAPARIN SODIUM 40 MG/0.4ML ~~LOC~~ SOLN
40.0000 mg | SUBCUTANEOUS | Status: DC
  Administered 2014-02-10 – 2014-02-11 (×2): 40 mg via SUBCUTANEOUS
  Filled 2014-02-10 (×3): qty 0.4

## 2014-02-10 MED ORDER — OXYCODONE-ACETAMINOPHEN 5-325 MG PO TABS: 1.0000 | ORAL_TABLET | ORAL | Status: DC | PRN

## 2014-02-10 NOTE — Progress Notes (Addendum)
States he can NOT tolerate percocet dt history of gi upset.  States he can tolerate anything else [id number].  Gave him IV MSo4 tonight for abdominal/back pain of 8/10 and pt got relief.  Temps are low grade 99.2 and 100.1.  Instructed to DB&C and IS.   Continues to refuse to walk hall even after education/rationale.  PT verbalizes understanding without futher questions.  Will continue to monitor.

## 2014-02-10 NOTE — Progress Notes (Signed)
1 Day Post-Op Lap Appy Subjective: Feels ok, hungry.  Pain controlled  Objective: Vital signs in last 24 hours: Temp:  [97.5 F (36.4 C)-100.4 F (38 C)] 100.4 F (38 C) (07/18 0600) Pulse Rate:  [58-93] 89 (07/18 0800) Resp:  [10-20] 18 (07/18 0600) BP: (103-164)/(52-87) 150/72 mmHg (07/18 0800) SpO2:  [95 %-100 %] 95 % (07/18 0600) Weight:  [234 lb (106.142 kg)] 234 lb (106.142 kg) (07/17 1655)   Intake/Output from previous day: 07/17 0701 - 07/18 0700 In: 1575 [I.V.:1575] Out: 455 [Urine:400; Drains:55] Intake/Output this shift: Total I/O In: 240 [P.O.:240] Out: -    General appearance: alert and cooperative GI: normal findings: soft, tender in RLQ JP with bloody drainage  Incision: no significant drainage  Lab Results:   Recent Labs  02/09/14 1709 02/10/14 0557  WBC 5.6 10.2  HGB 10.2* 9.5*  HCT 28.5* 27.4*  PLT 174 172   BMET  Recent Labs  02/09/14 1709 02/10/14 0557  NA 132* 134*  K 4.0 4.2  CL 95* 96  CO2 20 22  GLUCOSE 290* 228*  BUN 34* 26*  CREATININE 1.28 1.07  CALCIUM 9.4 8.9   PT/INR No results found for this basename: LABPROT, INR,  in the last 72 hours ABG No results found for this basename: PHART, PCO2, PO2, HCO3,  in the last 72 hours  MEDS, Scheduled . allopurinol  200 mg Oral q morning - 10a  . antiseptic oral rinse  15 mL Mouth Rinse BID  . carvedilol  12.5 mg Oral BID WC  . cholecalciferol  1,000 Units Oral Daily  . enoxaparin (LOVENOX) injection  40 mg Subcutaneous Q24H  . ertapenem (INVANZ) IV  1 g Intravenous Q24H  . ferrous sulfate  325 mg Oral TID PC  . gemfibrozil  600 mg Oral BID AC  . glipiZIDE  10 mg Oral QAC breakfast  . insulin aspart      . insulin aspart  0-20 Units Subcutaneous TID WC  . insulin aspart  0-5 Units Subcutaneous QHS  . levothyroxine  150 mcg Oral QAC breakfast  . lisinopril  20 mg Oral q morning - 10a  . pantoprazole  80 mg Oral Daily  . promethazine        Studies/Results: Ct Abdomen  Pelvis W Contrast  02/09/2014   CLINICAL DATA:  Abdominal pain.  Weakness.  Nausea and vomiting.  EXAM: CT ABDOMEN AND PELVIS WITH CONTRAST  TECHNIQUE: Multidetector CT imaging of the abdomen and pelvis was performed using the standard protocol following bolus administration of intravenous contrast.  CONTRAST:  136mL OMNIPAQUE IOHEXOL 300 MG/ML SOLN, 67mL OMNIPAQUE IOHEXOL 300 MG/ML SOLN  COMPARISON:  08/19/2012  FINDINGS: The appendix is thickened and spaced 1 cm and thickened and cement portion with surrounding significant inflammatory change. There is no extraluminal air and no defined fluid collection is seen to suggest an abscess. A appendix more distally is normal in caliber. Appendix extends posteriorly and inferiorly from the cecal tip in the right lower quadrant.  Clear lung bases. Heart is normal in size. Liver is mildly enlarged with evidence suggesting fatty infiltration. No liver mass or focal lesion. Spleen is mildly enlarged measuring 15 cm. No splenic mass or focal lesion.  There are gallstones. No acute cholecystitis. No bile duct dilation. Normal pancreas and adrenal glands. Small low-density renal lesions, likely cysts. Kidneys are otherwise unremarkable. No hydronephrosis. Ureters and bladder are unremarkable.  No pathologically enlarged lymph nodes.  No ascites.  Colon and small bowel are  unremarkable.  IMPRESSION: 1. Acute appendicitis. There is significant periappendiceal inflammation, but no extraluminal air to suggest perforation and no evidence of an abscess. 2. No other acute findings.   Electronically Signed   By: Lajean Manes M.D.   On: 02/09/2014 19:21    Assessment: s/p Procedure(s): APPENDECTOMY LAPAROSCOPIC Patient Active Problem List   Diagnosis Date Noted  . RLQ abdominal pain 02/09/2014  . Diarrhea 02/09/2014  . Appendicitis 02/09/2014  . Unspecified vitamin D deficiency 11/13/2013  . Carotid stenosis 11/06/2013  . Headache(784.0) 11/06/2013  . Psoriatic arthritis  11/06/2013  . Hypertriglyceridemia 05/16/2013  . HTN (hypertension), benign 09/10/2012  . Obesity 09/10/2012  . Gout 09/10/2012  . Angiodysplasia of intestinal tract 01/20/2012  . Iron deficiency anemia secondary to blood loss (chronic) 01/20/2012  . DM (diabetes mellitus) 12/22/2011  . Thyroid disease 12/22/2011    gangrenous appendicits  Plan: Advance diet Ambulate Cont Invanz for intra-abd infection Recheck labs in AM   LOS: 1 day     .Rosario Adie, [facility], [place]   02/10/2014 10:19 AM

## 2014-02-11 LAB — CBC
HEMATOCRIT: 22.4 % — AB (ref 39.0–52.0)
Hemoglobin: 7.8 g/dL — ABNORMAL LOW (ref 13.0–17.0)
MCH: 30.8 pg (ref 26.0–34.0)
MCHC: 34.8 g/dL (ref 30.0–36.0)
MCV: 88.5 fL (ref 78.0–100.0)
Platelets: 149 10*3/uL — ABNORMAL LOW (ref 150–400)
RBC: 2.53 MIL/uL — ABNORMAL LOW (ref 4.22–5.81)
RDW: 17.5 % — ABNORMAL HIGH (ref 11.5–15.5)
WBC: 6.2 10*3/uL (ref 4.0–10.5)

## 2014-02-11 LAB — GLUCOSE, CAPILLARY
GLUCOSE-CAPILLARY: 106 mg/dL — AB (ref 70–99)
Glucose-Capillary: 200 mg/dL — ABNORMAL HIGH (ref 70–99)
Glucose-Capillary: 186 mg/dL — ABNORMAL HIGH (ref 70–99)
Glucose-Capillary: 104 mg/dL — ABNORMAL HIGH (ref 70–99)

## 2014-02-11 MED ORDER — HYDROCODONE-ACETAMINOPHEN 5-325 MG PO TABS
1.0000 | ORAL_TABLET | ORAL | Status: DC | PRN
  Administered 2014-02-11 (×2): 2 via ORAL
  Administered 2014-02-12: 1 via ORAL
  Filled 2014-02-11 (×2): qty 2
  Filled 2014-02-11: qty 1

## 2014-02-11 MED ORDER — AMOXICILLIN-POT CLAVULANATE 875-125 MG PO TABS
1.0000 | ORAL_TABLET | Freq: Two times a day (BID) | ORAL | Status: DC
  Administered 2014-02-11 – 2014-02-12 (×3): 1 via ORAL
  Filled 2014-02-11 (×4): qty 1

## 2014-02-11 NOTE — Progress Notes (Signed)
Nutrition Brief Note  Patient identified on the Malnutrition Screening Tool (MST) Report  Wt Readings from Last 15 Encounters:  02/09/14 234 lb (106.142 kg)  02/09/14 234 lb (106.142 kg)  02/09/14 234 lb (106.142 kg)  01/16/14 241 lb 4.8 oz (109.453 kg)  11/13/13 246 lb (111.585 kg)  11/06/13 246 lb (111.585 kg)  07/14/13 244 lb 3.2 oz (110.768 kg)  05/16/13 250 lb 4 oz (113.513 kg)  01/12/13 248 lb 12.8 oz (112.855 kg)  12/08/12 250 lb (113.399 kg)  09/10/12 261 lb (118.389 kg)  08/17/12 251 lb 3.2 oz (113.944 kg)  01/19/12 246 lb (111.585 kg)  12/23/11 241 lb 2.9 oz (109.4 kg)  12/23/11 241 lb 2.9 oz (109.4 kg)    Body mass index is 32.65 kg/(m^2). Patient meets criteria for obesity based on current BMI.   Current diet order is carbohydrate modified. Labs and medications reviewed.   Pt reports that he is eating well and that he does not need any nutritional supplementation at this time. He says that he was eating well prior to admission, but says that weight loss was due to diarrhea and dehydration.  No nutrition interventions warranted at this time. If nutrition issues arise, please consult RD.   Terrace Arabia RD, LDN

## 2014-02-11 NOTE — Progress Notes (Signed)
2 Days Post-Op Lap Appy Subjective: Feels ok, hungry.  Not ambulating much.    Objective: Vital signs in last 24 hours: Temp:  [98.8 F (37.1 C)-100.1 F (37.8 C)] 99.3 F (37.4 C) (07/19 0520) Pulse Rate:  [82-88] 86 (07/19 0520) Resp:  [12-20] 12 (07/19 0520) BP: (113-152)/(37-64) 113/37 mmHg (07/19 0520) SpO2:  [96 %-97 %] 97 % (07/19 0520)   Intake/Output from previous day: 07/18 0701 - 07/19 0700 In: [place] [P.O.:1140; I.V.:500; IV Piggyback:50] Out: 512 [Urine:500; Drains:12] Intake/Output this shift:   General appearance: alert and cooperative GI: normal findings: soft, tender in RLQ JP with minimal clear drainage  Incision: no significant drainage  Lab Results:   Recent Labs  02/10/14 0557 02/11/14 0546  WBC 10.2 6.2  HGB 9.5* 7.8*  HCT 27.4* 22.4*  PLT 172 149*   BMET  Recent Labs  02/09/14 1709 02/10/14 0557  NA 132* 134*  K 4.0 4.2  CL 95* 96  CO2 20 22  GLUCOSE 290* 228*  BUN 34* 26*  CREATININE 1.28 1.07  CALCIUM 9.4 8.9   PT/INR No results found for this basename: LABPROT, INR,  in the last 72 hours ABG No results found for this basename: PHART, PCO2, PO2, HCO3,  in the last 72 hours  MEDS, Scheduled . allopurinol  200 mg Oral q morning - 10a  . amoxicillin-clavulanate  1 tablet Oral Q12H  . carvedilol  12.5 mg Oral BID WC  . cholecalciferol  1,000 Units Oral Daily  . enoxaparin (LOVENOX) injection  40 mg Subcutaneous Q24H  . ferrous sulfate  325 mg Oral TID PC  . gemfibrozil  600 mg Oral BID AC  . glipiZIDE  10 mg Oral QAC breakfast  . insulin aspart  0-20 Units Subcutaneous TID WC  . insulin aspart  0-5 Units Subcutaneous QHS  . levothyroxine  150 mcg Oral QAC breakfast  . lisinopril  20 mg Oral q morning - 10a  . pantoprazole  80 mg Oral Daily    Studies/Results: Ct Abdomen Pelvis W Contrast  02/09/2014   CLINICAL DATA:  Abdominal pain.  Weakness.  Nausea and vomiting.  EXAM: CT ABDOMEN AND PELVIS WITH CONTRAST  TECHNIQUE:  Multidetector CT imaging of the abdomen and pelvis was performed using the standard protocol following bolus administration of intravenous contrast.  CONTRAST:  185mL OMNIPAQUE IOHEXOL 300 MG/ML SOLN, 42mL OMNIPAQUE IOHEXOL 300 MG/ML SOLN  COMPARISON:  08/19/2012  FINDINGS: The appendix is thickened and spaced 1 cm and thickened and cement portion with surrounding significant inflammatory change. There is no extraluminal air and no defined fluid collection is seen to suggest an abscess. A appendix more distally is normal in caliber. Appendix extends posteriorly and inferiorly from the cecal tip in the right lower quadrant.  Clear lung bases. Heart is normal in size. Liver is mildly enlarged with evidence suggesting fatty infiltration. No liver mass or focal lesion. Spleen is mildly enlarged measuring 15 cm. No splenic mass or focal lesion.  There are gallstones. No acute cholecystitis. No bile duct dilation. Normal pancreas and adrenal glands. Small low-density renal lesions, likely cysts. Kidneys are otherwise unremarkable. No hydronephrosis. Ureters and bladder are unremarkable.  No pathologically enlarged lymph nodes.  No ascites.  Colon and small bowel are unremarkable.  IMPRESSION: 1. Acute appendicitis. There is significant periappendiceal inflammation, but no extraluminal air to suggest perforation and no evidence of an abscess. 2. No other acute findings.   Electronically Signed   By: Dedra Skeens.D.  On: 02/09/2014 19:21    Assessment: s/p Procedure(s): APPENDECTOMY LAPAROSCOPIC Patient Active Problem List   Diagnosis Date Noted  . Gangrenous appendicitis 02/10/2014  . RLQ abdominal pain 02/09/2014  . Diarrhea 02/09/2014  . Appendicitis 02/09/2014  . Unspecified vitamin D deficiency 11/13/2013  . Carotid stenosis 11/06/2013  . Headache(784.0) 11/06/2013  . Psoriatic arthritis 11/06/2013  . Hypertriglyceridemia 05/16/2013  . HTN (hypertension), benign 09/10/2012  . Obesity 09/10/2012   . Gout 09/10/2012  . Angiodysplasia of intestinal tract 01/20/2012  . Iron deficiency anemia secondary to blood loss (chronic) 01/20/2012  . DM (diabetes mellitus) 12/22/2011  . Thyroid disease 12/22/2011    gangrenous appendicits  Plan: Continue diet Ambulate Will switch to PO abx Possible d/c in AM   LOS: 2 days     .Rosario Adie, [facility], [place]   02/11/2014 9:35 AM

## 2014-02-12 ENCOUNTER — Telehealth: Payer: Self-pay | Admitting: Internal Medicine

## 2014-02-12 LAB — GLUCOSE, CAPILLARY
GLUCOSE-CAPILLARY: 144 mg/dL — AB (ref 70–99)
GLUCOSE-CAPILLARY: 171 mg/dL — AB (ref 70–99)

## 2014-02-12 MED ORDER — METFORMIN HCL 1000 MG PO TABS: 1000.0000 mg | ORAL_TABLET | Freq: Two times a day (BID) | ORAL | Status: DC

## 2014-02-12 MED ORDER — HYDROCODONE-ACETAMINOPHEN 5-325 MG PO TABS: 1.0000 | ORAL_TABLET | ORAL | Status: DC | PRN

## 2014-02-12 MED ORDER — AMOXICILLIN-POT CLAVULANATE 875-125 MG PO TABS: 1.0000 | ORAL_TABLET | Freq: Two times a day (BID) | ORAL | Status: DC

## 2014-02-12 NOTE — Telephone Encounter (Signed)
RX approved.

## 2014-02-12 NOTE — Discharge Instructions (Signed)
[facility] ______[facility] °LAPAROSCOPIC SURGERY: POST OP INSTRUCTIONS °Always review your discharge instruction sheet given to you by the facility where your surgery was performed. °IF YOU HAVE DISABILITY OR FAMILY LEAVE FORMS, YOU MUST BRING THEM TO THE OFFICE FOR PROCESSING.   °DO NOT GIVE THEM TO YOUR DOCTOR. ° °1. A prescription for pain medication may be given to you upon discharge.  Take your pain medication as prescribed, if needed.  If narcotic pain medicine is not needed, then you may take acetaminophen (Tylenol) or ibuprofen (Advil) as needed. °2. Take your usually prescribed medications unless otherwise directed. °3. If you need a refill on your pain medication, please contact your pharmacy.  They will contact our office to request authorization. Prescriptions will not be filled after 5pm or on week-ends. °4. You should follow a light diet the first few days after arrival home, such as soup and crackers, etc.  Be sure to include lots of fluids daily. °5. Most patients will experience some swelling and bruising in the area of the incisions.  Ice packs will help.  Swelling and bruising can take several days to resolve.  °6. It is common to experience some constipation if taking pain medication after surgery.  Increasing fluid intake and taking a stool softener (such as Colace) will usually help or prevent this problem from occurring.  A mild laxative (Milk of Magnesia or Miralax) should be taken according to package instructions if there are no bowel movements after 48 hours. °7. Unless discharge instructions indicate otherwise, you may remove your bandages 24-48 hours after surgery, and you may shower at that time.  You may have steri-strips (small skin tapes) in place directly over the incision.  These strips should be left on the skin for 7-10 days.  If your surgeon used skin glue on the incision, you may shower in 24 hours.  The glue will flake off over the next 2-3 weeks.  Any sutures or  staples will be removed at the office during your follow-up visit. °8. ACTIVITIES:  You may resume regular (light) daily activities beginning the next day--such as daily self-care, walking, climbing stairs--gradually increasing activities as tolerated.  You may have sexual intercourse when it is comfortable.  Refrain from any heavy lifting or straining until approved by your doctor. °a. You may drive when you are no longer taking prescription pain medication, you can comfortably wear a seatbelt, and you can safely maneuver your car and apply brakes. °b. RETURN TO WORK:  __________________________________________________________ °9. You should see your doctor in the office for a follow-up appointment approximately 2-3 weeks after your surgery.  Make sure that you call for this appointment within a day or two after you arrive home to insure a convenient appointment time. °10. OTHER INSTRUCTIONS: __________________________________________________________________________________________________________________________ __________________________________________________________________________________________________________________________ °WHEN TO CALL YOUR DOCTOR: °1. Fever over 101.0 °2. Inability to urinate °3. Continued bleeding from incision. °4. Increased pain, redness, or drainage from the incision. °5. Increasing abdominal pain ° °The clinic staff is available to answer your questions during regular business hours.  Please don’t hesitate to call and ask to speak to one of the nurses for clinical concerns.  If you have a medical emergency, go to the nearest emergency room or call 911.  A surgeon from [facility] is always on call at the hospital. °[address], [address], [place], [place]  [address] ? [address] [address], [place], [place]   [address] °[phone] ? [phone] ? FAX [phone] °Web site:   [email or web address] °

## 2014-02-12 NOTE — Telephone Encounter (Signed)
Pt requesting refill for metformin to go to [facility] on [address] and [address]. Please advise.

## 2014-02-12 NOTE — Discharge Summary (Signed)
Patient ID: Michael [name] MRN: [id number] DOB/AGE: 1941-06-20 73 y.o.  Admit date: 02/09/2014 Discharge date: 02/12/2014  Procedures: Laparoscopic appendectomy on [id number] by Dr. Leighton Ruff  Consults: None  Reason for Admission: States vague abd pain for the past 5 days. Got worse over the last 48h. Associated with diarrhea and nausea. Blood sugars have been elevated as well. Takes prednisone for arthritis. Hasn't had any in about a week. Denies chest pain or SOB with exertion.   Admission Diagnoses:  1. Acute appendicitis 2. Diabetes mellitus 3. Hypertension 4. Hypothyroidism 5. Iron deficiency anemia  Hospital Course: The patient was admitted and taken to the operating room where he underwent a laparoscopic appendectomy for a gangrenous appendicitis. He did have a JP drain left in place. He was placed on Invanz. His diet was able to be advanced on postoperative day one and his Colbert Ewing was continued. This was switched to oral Augmentin on postoperative day 2. His JP drain had minimal clear drainage was discontinued at this time. On postoperative day 3, the patient was tolerating a regular diet and his pain was well-controlled. He was stable for discharge home at this time.  PE: Abdomen: Soft, minimally tender in the right lower quadrant, all incisions are clean, dry, and intact. He has active bowel sounds.  Discharge Diagnoses:  Active Problems:   Appendicitis   Gangrenous appendicitis  status post laparoscopic appendectomy Diabetes mellitus Hypertension Iron deficiency anemia Hypothyroidism  Discharge Medications:   Medication List         acetaminophen 650 MG CR tablet  Commonly known as:  TYLENOL  Take 650 mg by mouth every 8 (eight) hours as needed. Take two tablets by mouth as needed     allopurinol 100 MG tablet  Commonly known as:  ZYLOPRIM  Take 200 mg by mouth every morning.     amoxicillin-clavulanate 875-125 MG per tablet  Commonly known as:  AUGMENTIN   Take 1 tablet by mouth every 12 (twelve) hours.     carvedilol 12.5 MG tablet  Commonly known as:  COREG  Take 1 tablet (12.5 mg total) by mouth 2 (two) times daily with a meal.     cholecalciferol 1000 UNITS tablet  Commonly known as:  VITAMIN D  Take 1 tablet (1,000 Units total) by mouth daily.     ergocalciferol 50000 UNITS capsule  Commonly known as:  VITAMIN D2  Take 1 capsule (50,000 Units total) by mouth once a week.     ferrous sulfate 325 (65 FE) MG tablet  Take 325 mg by mouth 3 (three) times daily with meals.     gemfibrozil 600 MG tablet  Commonly known as:  LOPID  Take 600 mg by mouth 2 (two) times daily before a meal.     glipiZIDE 10 MG 24 hr tablet  Commonly known as:  GLUCOTROL XL  Take 10 mg by mouth every morning.     HYDROcodone-acetaminophen 5-325 MG per tablet  Commonly known as:  NORCO/VICODIN  Take 1-2 tablets by mouth every 4 (four) hours as needed for moderate pain or severe pain.     levothyroxine 150 MCG tablet  Commonly known as:  SYNTHROID, LEVOTHROID  Take 150 mcg by mouth daily before breakfast.     lisinopril 20 MG tablet  Commonly known as:  PRINIVIL,ZESTRIL  Take 20 mg by mouth every morning.     metFORMIN 1000 MG tablet  Commonly known as:  GLUCOPHAGE  Take 1 tablet (1,000 mg total) by mouth 2 (  two) times daily with a meal.     omeprazole 40 MG capsule  Commonly known as:  PRILOSEC  Take 40 mg by mouth daily.     predniSONE 5 MG tablet  Commonly known as:  DELTASONE  Take 5 mg by mouth every morning.        Discharge Instructions:     Follow-up Information   Follow up with [facility] On 03/06/2014. (2:45pm, arrive at 2:15 PM for paperwork)    Contact information:   [address] [place]   [place] [address] [phone]       Signed: Henreitta Cea 02/12/2014, 10:32 AM

## 2014-02-13 ENCOUNTER — Encounter (HOSPITAL_COMMUNITY): Payer: Self-pay | Admitting: General Surgery

## 2014-02-15 ENCOUNTER — Ambulatory Visit: Payer: Medicare Other | Admitting: Nurse Practitioner

## 2014-03-01 DIAGNOSIS — H43819 Vitreous degeneration, unspecified eye: Secondary | ICD-10-CM | POA: Diagnosis not present

## 2014-03-01 DIAGNOSIS — H348192 Central retinal vein occlusion, unspecified eye, stable: Secondary | ICD-10-CM | POA: Diagnosis not present

## 2014-03-01 DIAGNOSIS — E1139 Type 2 diabetes mellitus with other diabetic ophthalmic complication: Secondary | ICD-10-CM | POA: Diagnosis not present

## 2014-03-01 DIAGNOSIS — H3581 Retinal edema: Secondary | ICD-10-CM | POA: Diagnosis not present

## 2014-03-06 ENCOUNTER — Ambulatory Visit (INDEPENDENT_AMBULATORY_CARE_PROVIDER_SITE_OTHER): Payer: Medicare Other | Admitting: General Surgery

## 2014-03-06 ENCOUNTER — Encounter (INDEPENDENT_AMBULATORY_CARE_PROVIDER_SITE_OTHER): Payer: Self-pay

## 2014-03-06 VITALS — BP 128/80 | HR 72 | Temp 98.3°F | Resp 14 | Ht 71.0 in | Wt 242.6 lb

## 2014-03-06 DIAGNOSIS — Z9049 Acquired absence of other specified parts of digestive tract: Secondary | ICD-10-CM

## 2014-03-06 DIAGNOSIS — Z9889 Other specified postprocedural states: Secondary | ICD-10-CM

## 2014-03-06 DIAGNOSIS — K3580 Unspecified acute appendicitis: Secondary | ICD-10-CM

## 2014-03-06 NOTE — Progress Notes (Addendum)
  Subjective: Michael [name] is a 73 y.o. male who had a laparoscopic appendectomy on 02/09/14 by Dr. Marcello Moores  returns to the clinic today.  Pathology reveals acute full thickness appendicitis and serositis, no tumor seen.  The patient is tolerating their diet well and is having no severe pain.  Bowel function is good.  The pre-operative symptoms of abdominal pain, nausea, and vomiting have resolved.  No problems with the wounds.  Pt is returning to normal activity / work.   Objective: Vital signs in last 24 hours: Reviewed   PE: General:  Alert, NAD, pleasant Abdomen:  soft, NT/ND, +bs, incisions appear well-healed with no sign of infection or bleeding, LLQ incision mildly erythematous without fluctuance, no drainage   Assessment/Plan  1.  S/P Laparoscopic Appendectomy: doing well, may resume regular activity without restrictions, Pt will follow up with [name] PRN and knows to call with questions or concerns.  Apply hot compresses to area over left lower abdominal incision and apply bacitracin twice a day with a band aid and if not improved call the office, you may need to be seen or need an antibiotic.    Coralie Keens, PA-C 03/06/2014

## 2014-03-06 NOTE — Patient Instructions (Addendum)
He may resume a regular diet and full activity.  He may follow-up on a as needed basis.  Apply hot compresses to area over left lower abdominal incision and apply triple ABX ointment twice a day with a band aid and if not improved call the office, you may need to be seen or need an antibiotic.

## 2014-03-08 ENCOUNTER — Other Ambulatory Visit: Payer: Self-pay | Admitting: Internal Medicine

## 2014-03-19 ENCOUNTER — Encounter: Payer: Self-pay | Admitting: Internal Medicine

## 2014-03-19 ENCOUNTER — Other Ambulatory Visit (INDEPENDENT_AMBULATORY_CARE_PROVIDER_SITE_OTHER): Payer: Medicare Other

## 2014-03-19 ENCOUNTER — Ambulatory Visit (INDEPENDENT_AMBULATORY_CARE_PROVIDER_SITE_OTHER): Payer: Medicare Other | Admitting: Internal Medicine

## 2014-03-19 VITALS — BP 170/80 | HR 80 | Temp 97.7°F | Resp 16 | Wt 240.0 lb

## 2014-03-19 DIAGNOSIS — N058 Unspecified nephritic syndrome with other morphologic changes: Secondary | ICD-10-CM

## 2014-03-19 DIAGNOSIS — I6529 Occlusion and stenosis of unspecified carotid artery: Secondary | ICD-10-CM

## 2014-03-19 DIAGNOSIS — M109 Gout, unspecified: Secondary | ICD-10-CM | POA: Diagnosis not present

## 2014-03-19 DIAGNOSIS — E1129 Type 2 diabetes mellitus with other diabetic kidney complication: Secondary | ICD-10-CM

## 2014-03-19 DIAGNOSIS — K3580 Unspecified acute appendicitis: Secondary | ICD-10-CM

## 2014-03-19 DIAGNOSIS — K35891 Other acute appendicitis without perforation, with gangrene: Secondary | ICD-10-CM

## 2014-03-19 DIAGNOSIS — L4050 Arthropathic psoriasis, unspecified: Secondary | ICD-10-CM

## 2014-03-19 DIAGNOSIS — I6523 Occlusion and stenosis of bilateral carotid arteries: Secondary | ICD-10-CM

## 2014-03-19 DIAGNOSIS — I658 Occlusion and stenosis of other precerebral arteries: Secondary | ICD-10-CM

## 2014-03-19 DIAGNOSIS — E1121 Type 2 diabetes mellitus with diabetic nephropathy: Secondary | ICD-10-CM

## 2014-03-19 DIAGNOSIS — E669 Obesity, unspecified: Secondary | ICD-10-CM

## 2014-03-19 DIAGNOSIS — L408 Other psoriasis: Secondary | ICD-10-CM

## 2014-03-19 DIAGNOSIS — L409 Psoriasis, unspecified: Secondary | ICD-10-CM | POA: Insufficient documentation

## 2014-03-19 DIAGNOSIS — I10 Essential (primary) hypertension: Secondary | ICD-10-CM

## 2014-03-19 LAB — CBC WITH DIFFERENTIAL/PLATELET
BASOS ABS: 0 10*3/uL (ref 0.0–0.1)
Basophils Relative: 0.6 % (ref 0.0–3.0)
EOS ABS: 0.1 10*3/uL (ref 0.0–0.7)
Eosinophils Relative: 3.2 % (ref 0.0–5.0)
HEMATOCRIT: 33.2 % — AB (ref 39.0–52.0)
HEMOGLOBIN: 11.2 g/dL — AB (ref 13.0–17.0)
LYMPHS ABS: 0.8 10*3/uL (ref 0.7–4.0)
LYMPHS PCT: 21.8 % (ref 12.0–46.0)
MCHC: 33.7 g/dL (ref 30.0–36.0)
MCV: 88.6 fl (ref 78.0–100.0)
MONOS PCT: 8.5 % (ref 3.0–12.0)
Monocytes Absolute: 0.3 10*3/uL (ref 0.1–1.0)
NEUTROS ABS: 2.3 10*3/uL (ref 1.4–7.7)
Neutrophils Relative %: 65.9 % (ref 43.0–77.0)
PLATELETS: 229 10*3/uL (ref 150.0–400.0)
RBC: 3.75 Mil/uL — ABNORMAL LOW (ref 4.22–5.81)
RDW: 16.3 % — ABNORMAL HIGH (ref 11.5–15.5)
WBC: 3.5 10*3/uL — ABNORMAL LOW (ref 4.0–10.5)

## 2014-03-19 LAB — BASIC METABOLIC PANEL
BUN: 17 mg/dL (ref 6–23)
CO2: 27 meq/L (ref 19–32)
Calcium: 9.2 mg/dL (ref 8.4–10.5)
Chloride: 103 mEq/L (ref 96–112)
Creatinine, Ser: 1 mg/dL (ref 0.4–1.5)
GFR: 75.56 mL/min (ref >60.00)
Glucose, Bld: 175 mg/dL — ABNORMAL HIGH (ref 70–99)
Potassium: 4.5 mEq/L (ref 3.5–5.1)
SODIUM: 138 meq/L (ref 135–145)

## 2014-03-19 LAB — LIPID PANEL
CHOL/HDL RATIO: 5
CHOLESTEROL: 146 mg/dL (ref 0–200)
HDL: 28.2 mg/dL — AB (ref >39.00)
NONHDL: 117.8
Triglycerides: 354 mg/dL — ABNORMAL HIGH (ref 0.0–149.0)
VLDL: 70.8 mg/dL — ABNORMAL HIGH (ref 0.0–40.0)

## 2014-03-19 LAB — HEPATIC FUNCTION PANEL
ALT: 11 U/L (ref 0–53)
AST: 17 U/L (ref 0–37)
Albumin: 4.2 g/dL (ref 3.5–5.2)
Alkaline Phosphatase: 54 U/L (ref 39–117)
BILIRUBIN DIRECT: 0.1 mg/dL (ref 0.0–0.3)
BILIRUBIN TOTAL: 1 mg/dL (ref 0.2–1.2)
Total Protein: 7.5 g/dL (ref 6.0–8.3)

## 2014-03-19 LAB — LDL CHOLESTEROL, DIRECT: Direct LDL: 77.2 mg/dL

## 2014-03-19 LAB — HEMOGLOBIN A1C: Hgb A1c MFr Bld: 5.1 % (ref 4.6–6.5)

## 2014-03-19 MED ORDER — CLOBETASOL PROPIONATE 0.05 % EX SOLN: 1.0000 "application " | Freq: Two times a day (BID) | CUTANEOUS | Status: DC

## 2014-03-19 MED ORDER — TRIAMCINOLONE ACETONIDE 0.5 % EX OINT: 1.0000 "application " | TOPICAL_OINTMENT | Freq: Three times a day (TID) | CUTANEOUS | Status: DC

## 2014-03-19 NOTE — Assessment & Plan Note (Signed)
Continue with current prescription therapy as reflected on the Med list.  

## 2014-03-19 NOTE — Assessment & Plan Note (Signed)
Risks associated with diet and treatment noncompliance were discussed. Compliance was encouraged. Labs

## 2014-03-19 NOTE — Assessment & Plan Note (Signed)
7/15 appendectomy

## 2014-03-19 NOTE — Progress Notes (Signed)
Pre visit review using our clinic review tool, if applicable. No additional management support is needed unless otherwise documented below in the visit note. 

## 2014-03-19 NOTE — Assessment & Plan Note (Signed)
BP is ok at home

## 2014-03-19 NOTE — Assessment & Plan Note (Signed)
Wt Readings from Last 3 Encounters:  03/19/14 240 lb (108.863 kg)  03/06/14 242 lb 9.6 oz (110.043 kg)  02/09/14 234 lb (106.142 kg)

## 2014-03-19 NOTE — Assessment & Plan Note (Signed)
Chronic  8/15 worse off MTX Triamc oint Clobetasol sol

## 2014-03-19 NOTE — Assessment & Plan Note (Signed)
Re-order Carot doppler

## 2014-03-19 NOTE — Progress Notes (Signed)
   Subjective:     HPI  F/u HTN, PVD, DM, hypothyroidism, Vit D def F/u occip HAs - better. BP is ok at home  BP Readings from Last 3 Encounters:  03/19/14 170/80  03/06/14 128/80  02/12/14 135/94      Review of Systems  Constitutional: Negative for appetite change, fatigue and unexpected weight change.  HENT: Negative for congestion, nosebleeds, sneezing, sore throat and trouble swallowing.   Eyes: Negative for itching and visual disturbance.  Respiratory: Negative for cough.   Cardiovascular: Negative for chest pain, palpitations and leg swelling.  Gastrointestinal: Negative for nausea, diarrhea, blood in stool and abdominal distention.  Genitourinary: Negative for frequency and hematuria.  Musculoskeletal: Negative for back pain, gait problem, joint swelling and neck pain.  Skin: Negative for rash.  Neurological: Negative for dizziness, tremors, speech difficulty and weakness.  Psychiatric/Behavioral: Negative for sleep disturbance, dysphoric mood and agitation. The patient is not nervous/anxious.        Objective:   Physical Exam  Constitutional: He is oriented to person, place, and time. He appears well-developed. No distress.  NAD  HENT:  Mouth/Throat: Oropharynx is clear and moist.  Eyes: Conjunctivae are normal. Pupils are equal, round, and reactive to light.  Neck: Normal range of motion. No JVD present. No thyromegaly present.  Cardiovascular: Normal rate, regular rhythm, normal heart sounds and intact distal pulses.  Exam reveals no gallop and no friction rub.   No murmur heard. Pulmonary/Chest: Effort normal and breath sounds normal. No respiratory distress. He has no wheezes. He has no rales. He exhibits no tenderness.  Abdominal: Soft. Bowel sounds are normal. He exhibits no distension and no mass. There is no tenderness. There is no rebound and no guarding.  Musculoskeletal: Normal range of motion. He exhibits no edema and no tenderness.  Lymphadenopathy:      He has no cervical adenopathy.  Neurological: He is alert and oriented to person, place, and time. He has normal reflexes. No cranial nerve deficit. He exhibits normal muscle tone. He displays a negative Romberg sign. Coordination and gait normal.  No meningeal signs  Skin: Skin is warm and dry. No rash noted.  Psychiatric: He has a normal mood and affect. His behavior is normal. Judgment and thought content normal.  psoriatc patches - trunk, extr-s, scalp   Lab Results  Component Value Date   WBC 6.2 02/11/2014   HGB 7.8* 02/11/2014   HCT 22.4* 02/11/2014   PLT 149* 02/11/2014   GLUCOSE 228* 02/10/2014   CHOL 149 11/07/2013   TRIG 643.0* 11/07/2013   HDL 27.70* 11/07/2013   LDLDIRECT 55.8 05/16/2013   LDLCALC -7* 11/07/2013   ALT 11 02/09/2014   AST 15 02/09/2014   NA 134* 02/10/2014   K 4.2 02/10/2014   CL 96 02/10/2014   CREATININE 1.07 02/10/2014   BUN 26* 02/10/2014   CO2 22 02/10/2014   TSH 1.23 11/07/2013   PSA 0.50 11/07/2013   INR 1.02 08/28/2009   HGBA1C 6.2 11/07/2013   MICROALBUR 1.2 05/16/2013   Hosp records reviewed      Assessment & Plan:

## 2014-03-19 NOTE — Assessment & Plan Note (Signed)
Chronic Dr Charlestine Night  8/15 not worse off MTX

## 2014-03-27 ENCOUNTER — Other Ambulatory Visit: Payer: Self-pay | Admitting: *Deleted

## 2014-03-27 DIAGNOSIS — I6523 Occlusion and stenosis of bilateral carotid arteries: Secondary | ICD-10-CM

## 2014-03-30 ENCOUNTER — Ambulatory Visit (HOSPITAL_COMMUNITY): Payer: Medicare Other | Attending: Cardiology | Admitting: Radiology

## 2014-03-30 DIAGNOSIS — E785 Hyperlipidemia, unspecified: Secondary | ICD-10-CM | POA: Diagnosis not present

## 2014-03-30 DIAGNOSIS — I6529 Occlusion and stenosis of unspecified carotid artery: Secondary | ICD-10-CM | POA: Diagnosis not present

## 2014-03-30 DIAGNOSIS — I658 Occlusion and stenosis of other precerebral arteries: Secondary | ICD-10-CM | POA: Diagnosis not present

## 2014-03-30 DIAGNOSIS — I6523 Occlusion and stenosis of bilateral carotid arteries: Secondary | ICD-10-CM

## 2014-03-30 DIAGNOSIS — E119 Type 2 diabetes mellitus without complications: Secondary | ICD-10-CM | POA: Diagnosis not present

## 2014-03-30 DIAGNOSIS — Z87891 Personal history of nicotine dependence: Secondary | ICD-10-CM | POA: Insufficient documentation

## 2014-03-30 DIAGNOSIS — I10 Essential (primary) hypertension: Secondary | ICD-10-CM | POA: Diagnosis not present

## 2014-03-30 NOTE — Progress Notes (Signed)
Carotid Duplex performed. 

## 2014-04-07 ENCOUNTER — Telehealth: Payer: Self-pay

## 2014-04-07 NOTE — Telephone Encounter (Signed)
LVM for pt to call back.   RE: BP recheck via 5 min. nurse visit 

## 2014-04-09 ENCOUNTER — Telehealth: Payer: Self-pay | Admitting: Internal Medicine

## 2014-04-09 NOTE — Telephone Encounter (Signed)
Patient wife would like call back in regards to patient lab results.

## 2014-04-09 NOTE — Telephone Encounter (Signed)
Called pt gave her md response from 03/19/14 on labs. Also wife ask about carotid doppler results done 03/30/14 gave her md response...Michael [name]

## 2014-04-10 DIAGNOSIS — E119 Type 2 diabetes mellitus without complications: Secondary | ICD-10-CM | POA: Diagnosis not present

## 2014-04-10 DIAGNOSIS — L4050 Arthropathic psoriasis, unspecified: Secondary | ICD-10-CM | POA: Diagnosis not present

## 2014-04-10 DIAGNOSIS — R109 Unspecified abdominal pain: Secondary | ICD-10-CM | POA: Diagnosis not present

## 2014-04-26 DIAGNOSIS — H3581 Retinal edema: Secondary | ICD-10-CM | POA: Diagnosis not present

## 2014-04-26 DIAGNOSIS — H34811 Central retinal vein occlusion, right eye: Secondary | ICD-10-CM | POA: Diagnosis not present

## 2014-05-18 ENCOUNTER — Telehealth: Payer: Self-pay | Admitting: Internal Medicine

## 2014-05-18 MED ORDER — GLUCOSE BLOOD VI STRP: ORAL_STRIP | Status: DC

## 2014-05-18 MED ORDER — ONETOUCH ULTRA 2 W/DEVICE KIT: PACK | Status: AC

## 2014-05-18 MED ORDER — ONETOUCH ULTRASOFT LANCETS MISC: Status: AC

## 2014-05-18 NOTE — Telephone Encounter (Signed)
Wife called and request new meter as old meter no longer makes strips or availability of strips very limited or hard to obtain. [place]. Please advise.

## 2014-05-18 NOTE — Telephone Encounter (Signed)
Called pt no answer LMOM monitor/supplies send to [facility]...Michael [name]

## 2014-06-05 DIAGNOSIS — Z7952 Long term (current) use of systemic steroids: Secondary | ICD-10-CM | POA: Diagnosis not present

## 2014-06-05 DIAGNOSIS — L4050 Arthropathic psoriasis, unspecified: Secondary | ICD-10-CM | POA: Diagnosis not present

## 2014-06-05 DIAGNOSIS — M25431 Effusion, right wrist: Secondary | ICD-10-CM | POA: Diagnosis not present

## 2014-06-05 DIAGNOSIS — M25442 Effusion, left hand: Secondary | ICD-10-CM | POA: Diagnosis not present

## 2014-06-05 DIAGNOSIS — M25432 Effusion, left wrist: Secondary | ICD-10-CM | POA: Diagnosis not present

## 2014-06-05 DIAGNOSIS — M25441 Effusion, right hand: Secondary | ICD-10-CM | POA: Diagnosis not present

## 2014-06-15 ENCOUNTER — Other Ambulatory Visit: Payer: Self-pay | Admitting: Internal Medicine

## 2014-06-25 ENCOUNTER — Other Ambulatory Visit: Payer: Self-pay | Admitting: Internal Medicine

## 2014-07-03 ENCOUNTER — Telehealth: Payer: Self-pay | Admitting: Oncology

## 2014-07-03 NOTE — Telephone Encounter (Signed)
Pt's wife called to r/s due to pt has another MD visit, [name] confirmed labs/ov, mailed out new schedule...Marland KitchenMarland KitchenMarland Kitchen KJ

## 2014-07-05 ENCOUNTER — Ambulatory Visit: Payer: Medicare Other | Admitting: Oncology

## 2014-07-05 ENCOUNTER — Other Ambulatory Visit: Payer: Medicare Other

## 2014-07-05 DIAGNOSIS — H34811 Central retinal vein occlusion, right eye: Secondary | ICD-10-CM | POA: Diagnosis not present

## 2014-07-05 DIAGNOSIS — H3581 Retinal edema: Secondary | ICD-10-CM | POA: Diagnosis not present

## 2014-07-09 ENCOUNTER — Other Ambulatory Visit: Payer: Self-pay | Admitting: Internal Medicine

## 2014-07-10 ENCOUNTER — Other Ambulatory Visit (INDEPENDENT_AMBULATORY_CARE_PROVIDER_SITE_OTHER): Payer: Medicare Other

## 2014-07-10 ENCOUNTER — Ambulatory Visit (INDEPENDENT_AMBULATORY_CARE_PROVIDER_SITE_OTHER): Payer: Medicare Other | Admitting: Internal Medicine

## 2014-07-10 ENCOUNTER — Encounter: Payer: Self-pay | Admitting: Internal Medicine

## 2014-07-10 VITALS — BP 160/90 | HR 77 | Temp 97.8°F | Wt 237.0 lb

## 2014-07-10 DIAGNOSIS — I10 Essential (primary) hypertension: Secondary | ICD-10-CM | POA: Diagnosis not present

## 2014-07-10 DIAGNOSIS — M25431 Effusion, right wrist: Secondary | ICD-10-CM | POA: Diagnosis not present

## 2014-07-10 DIAGNOSIS — L4050 Arthropathic psoriasis, unspecified: Secondary | ICD-10-CM | POA: Diagnosis not present

## 2014-07-10 DIAGNOSIS — L409 Psoriasis, unspecified: Secondary | ICD-10-CM | POA: Diagnosis not present

## 2014-07-10 DIAGNOSIS — E1129 Type 2 diabetes mellitus with other diabetic kidney complication: Secondary | ICD-10-CM

## 2014-07-10 DIAGNOSIS — E1165 Type 2 diabetes mellitus with hyperglycemia: Secondary | ICD-10-CM | POA: Diagnosis not present

## 2014-07-10 DIAGNOSIS — I6529 Occlusion and stenosis of unspecified carotid artery: Secondary | ICD-10-CM

## 2014-07-10 DIAGNOSIS — M25519 Pain in unspecified shoulder: Secondary | ICD-10-CM | POA: Insufficient documentation

## 2014-07-10 DIAGNOSIS — IMO0002 Reserved for concepts with insufficient information to code with codable children: Secondary | ICD-10-CM

## 2014-07-10 DIAGNOSIS — E119 Type 2 diabetes mellitus without complications: Secondary | ICD-10-CM | POA: Diagnosis not present

## 2014-07-10 DIAGNOSIS — M25511 Pain in right shoulder: Secondary | ICD-10-CM

## 2014-07-10 DIAGNOSIS — M1000 Idiopathic gout, unspecified site: Secondary | ICD-10-CM | POA: Diagnosis not present

## 2014-07-10 DIAGNOSIS — Z7952 Long term (current) use of systemic steroids: Secondary | ICD-10-CM | POA: Diagnosis not present

## 2014-07-10 LAB — BASIC METABOLIC PANEL
BUN: 18 mg/dL (ref 6–23)
CALCIUM: 8.5 mg/dL (ref 8.4–10.5)
CO2: 24 mEq/L (ref 19–32)
Chloride: 104 mEq/L (ref 96–112)
Creatinine, Ser: 0.9 mg/dL (ref 0.4–1.5)
GFR: 92.97 mL/min (ref >60.00)
GLUCOSE: 266 mg/dL — AB (ref 70–99)
Potassium: 4 mEq/L (ref 3.5–5.1)
SODIUM: 134 meq/L — AB (ref 135–145)

## 2014-07-10 LAB — HEMOGLOBIN A1C: Hgb A1c MFr Bld: 7.6 % — ABNORMAL HIGH (ref 4.6–6.5)

## 2014-07-10 MED ORDER — GLIPIZIDE ER 10 MG PO TB24: 10.0000 mg | ORAL_TABLET | Freq: Every morning | ORAL | Status: DC

## 2014-07-10 MED ORDER — METFORMIN HCL 1000 MG PO TABS: ORAL_TABLET | ORAL | Status: DC

## 2014-07-10 NOTE — Progress Notes (Signed)
Pre visit review using our clinic review tool, if applicable. No additional management support is needed unless otherwise documented below in the visit note. 

## 2014-07-10 NOTE — Assessment & Plan Note (Addendum)
Chronic  Continue with current prescription therapy as reflected on the Med list.  

## 2014-07-10 NOTE — Assessment & Plan Note (Signed)
Continue with current prescription therapy as reflected on the Med list.  

## 2014-07-10 NOTE — Progress Notes (Signed)
   Subjective:     HPI  F/u HTN, PVD, DM, hypothyroidism, Vit D def F/u occip HAs - better. BP is ok at home C/o R shoulder bl pain after putting up tree decorations  BP Readings from Last 3 Encounters:  07/10/14 160/90  03/19/14 170/80  03/06/14 128/80      Review of Systems  Constitutional: Negative for appetite change, fatigue and unexpected weight change.  HENT: Negative for congestion, nosebleeds, sneezing, sore throat and trouble swallowing.   Eyes: Negative for itching and visual disturbance.  Respiratory: Negative for cough.   Cardiovascular: Negative for chest pain, palpitations and leg swelling.  Gastrointestinal: Negative for nausea, diarrhea, blood in stool and abdominal distention.  Genitourinary: Negative for frequency and hematuria.  Musculoskeletal: Negative for back pain, joint swelling, gait problem and neck pain.  Skin: Negative for rash.  Neurological: Negative for dizziness, tremors, speech difficulty and weakness.  Psychiatric/Behavioral: Negative for sleep disturbance, dysphoric mood and agitation. The patient is not nervous/anxious.        Objective:   Physical Exam  Constitutional: He is oriented to person, place, and time. He appears well-developed. No distress.  NAD  HENT:  Mouth/Throat: Oropharynx is clear and moist.  Eyes: Conjunctivae are normal. Pupils are equal, round, and reactive to light.  Neck: Normal range of motion. No JVD present. No thyromegaly present.  Cardiovascular: Normal rate, regular rhythm, normal heart sounds and intact distal pulses.  Exam reveals no gallop and no friction rub.   No murmur heard. Pulmonary/Chest: Effort normal and breath sounds normal. No respiratory distress. He has no wheezes. He has no rales. He exhibits no tenderness.  Abdominal: Soft. Bowel sounds are normal. He exhibits no distension and no mass. There is no tenderness. There is no rebound and no guarding.  Musculoskeletal: Normal range of motion. He  exhibits no edema or tenderness.  Lymphadenopathy:    He has no cervical adenopathy.  Neurological: He is alert and oriented to person, place, and time. He has normal reflexes. No cranial nerve deficit. He exhibits normal muscle tone. He displays a negative Romberg sign. Coordination and gait normal.  No meningeal signs  Skin: Skin is warm and dry. Rash noted.  Psychiatric: He has a normal mood and affect. His behavior is normal. Judgment and thought content normal.  psoriatc patches - trunk, extr-s, scalp R sh blade is tender in suprsinat area  Lab Results  Component Value Date   WBC 3.5* 03/19/2014   HGB 11.2* 03/19/2014   HCT 33.2* 03/19/2014   PLT 229.0 03/19/2014   GLUCOSE 175* 03/19/2014   CHOL 146 03/19/2014   TRIG 354.0* 03/19/2014   HDL 28.20* 03/19/2014   LDLDIRECT 77.2 03/19/2014   LDLCALC -7* 11/07/2013   ALT 11 03/19/2014   AST 17 03/19/2014   NA 138 03/19/2014   K 4.5 03/19/2014   CL 103 03/19/2014   CREATININE 1.0 03/19/2014   BUN 17 03/19/2014   CO2 27 03/19/2014   TSH 1.23 11/07/2013   PSA 0.50 11/07/2013   INR 1.02 08/28/2009   HGBA1C 5.1 03/19/2014   MICROALBUR 1.2 05/16/2013   Hosp records reviewed      Assessment & Plan:  Patient ID: Michael [name], male   DOB: [birth year], 73 y.o.   MRN: [id number]

## 2014-07-10 NOTE — Assessment & Plan Note (Addendum)
12/15 posterior - MSK strain Declined inj in trigger point on shoulder blade Massage and stretch Heat

## 2014-07-10 NOTE — Assessment & Plan Note (Signed)
Chronic Better

## 2014-07-10 NOTE — Assessment & Plan Note (Signed)
LABS Continue with current prescription therapy as reflected on the Med list.

## 2014-07-16 DIAGNOSIS — L4050 Arthropathic psoriasis, unspecified: Secondary | ICD-10-CM | POA: Diagnosis not present

## 2014-07-16 DIAGNOSIS — E119 Type 2 diabetes mellitus without complications: Secondary | ICD-10-CM | POA: Diagnosis not present

## 2014-07-16 DIAGNOSIS — M25431 Effusion, right wrist: Secondary | ICD-10-CM | POA: Diagnosis not present

## 2014-07-16 DIAGNOSIS — Z7952 Long term (current) use of systemic steroids: Secondary | ICD-10-CM | POA: Diagnosis not present

## 2014-07-26 ENCOUNTER — Other Ambulatory Visit (HOSPITAL_BASED_OUTPATIENT_CLINIC_OR_DEPARTMENT_OTHER): Payer: Medicare Other

## 2014-07-26 ENCOUNTER — Ambulatory Visit (HOSPITAL_BASED_OUTPATIENT_CLINIC_OR_DEPARTMENT_OTHER): Payer: Medicare Other | Admitting: Oncology

## 2014-07-26 VITALS — BP 188/64 | HR 80 | Temp 97.7°F | Resp 20 | Ht 71.0 in | Wt 238.6 lb

## 2014-07-26 DIAGNOSIS — D509 Iron deficiency anemia, unspecified: Secondary | ICD-10-CM | POA: Diagnosis not present

## 2014-07-26 DIAGNOSIS — I6529 Occlusion and stenosis of unspecified carotid artery: Secondary | ICD-10-CM

## 2014-07-26 DIAGNOSIS — D500 Iron deficiency anemia secondary to blood loss (chronic): Secondary | ICD-10-CM

## 2014-07-26 LAB — CBC WITH DIFFERENTIAL/PLATELET
BASO%: 1.2 % (ref 0.0–2.0)
BASOS ABS: 0.1 10*3/uL (ref 0.0–0.1)
EOS%: 1.2 % (ref 0.0–7.0)
Eosinophils Absolute: 0.1 10*3/uL (ref 0.0–0.5)
HCT: 37.6 % — ABNORMAL LOW (ref 38.4–49.9)
HEMOGLOBIN: 12.8 g/dL — AB (ref 13.0–17.1)
LYMPH%: 7.7 % — ABNORMAL LOW (ref 14.0–49.0)
MCH: 29.1 pg (ref 27.2–33.4)
MCHC: 33.9 g/dL (ref 32.0–36.0)
MCV: 85.9 fL (ref 79.3–98.0)
MONO#: 0.3 10*3/uL (ref 0.1–0.9)
MONO%: 6.6 % (ref 0.0–14.0)
NEUT#: 4.3 10*3/uL (ref 1.5–6.5)
NEUT%: 83.3 % — AB (ref 39.0–75.0)
Platelets: 171 10*3/uL (ref 140–400)
RBC: 4.38 10*6/uL (ref 4.20–5.82)
RDW: 18.2 % — ABNORMAL HIGH (ref 11.0–14.6)
WBC: 5.2 10*3/uL (ref 4.0–10.3)
lymph#: 0.4 10*3/uL — ABNORMAL LOW (ref 0.9–3.3)

## 2014-07-26 LAB — COMPREHENSIVE METABOLIC PANEL (CC13)
ALT: 13 U/L (ref 0–55)
ANION GAP: 10 meq/L (ref 3–11)
AST: 13 U/L (ref 5–34)
Albumin: 3.7 g/dL (ref 3.5–5.0)
Alkaline Phosphatase: 77 U/L (ref 40–150)
BUN: 17.1 mg/dL (ref 7.0–26.0)
CO2: 26 meq/L (ref 22–29)
Calcium: 9.2 mg/dL (ref 8.4–10.4)
Chloride: 101 mEq/L (ref 98–109)
Creatinine: 1.1 mg/dL (ref 0.7–1.3)
EGFR: 64 mL/min/{1.73_m2} — ABNORMAL LOW (ref >90)
Glucose: 417 mg/dl — ABNORMAL HIGH (ref 70–140)
Potassium: 5.1 mEq/L (ref 3.5–5.1)
Sodium: 137 mEq/L (ref 136–145)
TOTAL PROTEIN: 7.6 g/dL (ref 6.4–8.3)
Total Bilirubin: 0.67 mg/dL (ref 0.20–1.20)

## 2014-07-26 NOTE — Addendum Note (Signed)
Addended by: Randolm Idol on: 07/26/2014 01:40 PM   Modules accepted: Medications

## 2014-07-26 NOTE — Progress Notes (Signed)
Hematology and Oncology Follow Up Visit  Michael [name] [id number] [birth year] 73 y.o. 07/26/2014 1:09 PM Michael [name], MDPlotnikov, Evie Lacks, MD   Principle Diagnosis: 73 year old gentleman diagnosed with leukocytopenia and mild anemia. He was initially evaluated in June of 2014. His leukocytopenia is likely reactive and have resolved and his anemia is related to iron deficiency from AVM.   Current therapy: Oral iron replacement three times a day.   Interim History:  Michael [name] presents today for a followup visit with his wife. Since his last visit, he discontinued methotrexate due 2 GI toxicity. Since doing that, he has been doing relatively well.  He is tolerating oral iron replacement without any complications. He is not reporting any nausea or vomiting. Has not reported any hematochezia or melena. Has not reported any decline in his energy or performance status. Has not reported any recent hospitalizations or illnesses. Has not reported any headaches or blurry vision or double vision. Is not reporting any chest pain shortness of breath or cough. Has not reported any hemoptysis or hematemesis. Does not report any nausea or vomiting or abdominal pain. Did not report any frequency urgency or hesitancy. He does have skin rashes associated with psoriasis. Rest of his review of systems unremarkable  Medications: I have reviewed the patient's current medications.  Current Outpatient Prescriptions  Medication Sig Dispense Refill  . acetaminophen (TYLENOL) 650 MG CR tablet Take 650 mg by mouth every 8 (eight) hours as needed. Take two tablets by mouth as needed    . allopurinol (ZYLOPRIM) 100 MG tablet Take 200 mg by mouth every morning.     . Blood Glucose Monitoring Suppl (ONE TOUCH ULTRA 2) W/DEVICE KIT Use as directed Dx E11.9 1 each 0  . carvedilol (COREG) 12.5 MG tablet Take 1 tablet (12.5 mg total) by mouth 2 (two) times daily with a meal. 60 tablet 11  . cholecalciferol (VITAMIN D) 1000  UNITS tablet Take 1 tablet (1,000 Units total) by mouth daily. 100 tablet 3  . clobetasol (TEMOVATE) 0.05 % external solution Apply 1 application topically 2 (two) times daily. On scalp 50 mL 4  . ergocalciferol (VITAMIN D2) 50000 UNITS capsule Take 1 capsule (50,000 Units total) by mouth once a week. 6 capsule 0  . ferrous sulfate 325 (65 FE) MG tablet Take 325 mg by mouth 3 (three) times daily with meals.    [name] gemfibrozil (LOPID) 600 MG tablet Take 600 mg by mouth 2 (two) times daily before a meal.    . glipiZIDE (GLUCOTROL XL) 10 MG 24 hr tablet Take 1 tablet (10 mg total) by mouth every morning. 90 tablet 3  . glucose blood (ONE TOUCH ULTRA TEST) test strip Use as instructed 30 each 11  . Lancets (ONETOUCH ULTRASOFT) lancets Use to check blood sugars daily 30 each 11  . levothyroxine (SYNTHROID, LEVOTHROID) 150 MCG tablet Take 150 mcg by mouth daily before breakfast.    . levothyroxine (SYNTHROID, LEVOTHROID) 150 MCG tablet Take 1 tablet (150 mcg total) by mouth daily before breakfast. 90 tablet 2  . lisinopril (PRINIVIL,ZESTRIL) 20 MG tablet Take 20 mg by mouth every morning.     . metFORMIN (GLUCOPHAGE) 1000 MG tablet TAKE 1 TABLET BY MOUTH TWICE DAILY WITH A MEAL 180 tablet 3  . omeprazole (PRILOSEC) 40 MG capsule TAKE ONE CAPSULE BY MOUTH EVERY DAY 90 capsule 0  . predniSONE (DELTASONE) 5 MG tablet Take 5 mg by mouth every morning.     . triamcinolone ointment (KENALOG) 0.5 %  Apply 1 application topically 3 (three) times daily. 90 g 3   No current facility-administered medications for this visit.     Allergies:  Allergies  Allergen Reactions  . Percocet [Oxycodone-Acetaminophen] Nausea And Vomiting    Past Medical History, Surgical history, Social history, and Family History were reviewed and updated.  Physical Exam: Blood pressure 188/64, pulse 80, temperature 97.7 F (36.5 C), temperature source Oral, resp. rate 20, height 5' 11"  (1.803 m), weight 238 lb 9.6 oz (108.228 kg),  SpO2 97 %. ECOG: 1 General appearance: alert awake not in any distress. Head: Normocephalic, without obvious abnormality Neck: no adenopathy Lymph nodes: Cervical, supraclavicular, and axillary nodes normal. Heart:regular rate and rhythm, S1, S2 normal, no murmur, click, rub or gallop Lung:chest clear, no wheezing, rales, normal symmetric air entry. Abdomin: soft, non-tender, without masses or organomegaly EXT:no erythema, induration, or nodules   Lab Results: Lab Results  Component Value Date   WBC 5.2 07/26/2014   HGB 12.8* 07/26/2014   HCT 37.6* 07/26/2014   MCV 85.9 07/26/2014   PLT 171 07/26/2014     Chemistry      Component Value Date/Time   NA 134* 07/10/2014 1112   NA 140 01/16/2014 0930   NA 137 10/09/2013   K 4.0 07/10/2014 1112   K 4.1 01/16/2014 0930   CL 104 07/10/2014 1112   CL 104 01/12/2013 1053   CO2 24 07/10/2014 1112   CO2 25 01/16/2014 0930   BUN 18 07/10/2014 1112   BUN 15.5 01/16/2014 0930   BUN 20 10/09/2013   CREATININE 0.9 07/10/2014 1112   CREATININE 1.1 01/16/2014 0930   CREATININE 1.0 10/09/2013   GLU 263 10/09/2013      Component Value Date/Time   CALCIUM 8.5 07/10/2014 1112   CALCIUM 8.9 01/16/2014 0930   ALKPHOS 54 03/19/2014 1132   ALKPHOS 63 01/16/2014 0930   AST 17 03/19/2014 1132   AST 9 01/16/2014 0930   ALT 11 03/19/2014 1132   ALT 8 01/16/2014 0930   BILITOT 1.0 03/19/2014 1132   BILITOT 0.72 01/16/2014 0930      Impression and Plan:  73 year old gentleman with the following issues:   1. 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 I doubt he has a lymphoproliferative disorder. His white cell count has normalized since stopping methotrexate. No further intervention or follow-up needed regarding this issue.  2. Iron deficiency anemia: He is on iron replacement orally and his hemoglobin is close to normal at this point. I recommended continuing iron supplements as he is  doing.  3. Followup: Will be as needed move forward.   [facility], MD 12/31/20151:09 PM

## 2014-08-09 DIAGNOSIS — H3581 Retinal edema: Secondary | ICD-10-CM | POA: Diagnosis not present

## 2014-08-09 DIAGNOSIS — E119 Type 2 diabetes mellitus without complications: Secondary | ICD-10-CM | POA: Diagnosis not present

## 2014-08-09 DIAGNOSIS — H34811 Central retinal vein occlusion, right eye: Secondary | ICD-10-CM | POA: Diagnosis not present

## 2014-08-14 DIAGNOSIS — Z7952 Long term (current) use of systemic steroids: Secondary | ICD-10-CM | POA: Diagnosis not present

## 2014-09-04 DIAGNOSIS — D61818 Other pancytopenia: Secondary | ICD-10-CM | POA: Diagnosis not present

## 2014-09-04 DIAGNOSIS — L4050 Arthropathic psoriasis, unspecified: Secondary | ICD-10-CM | POA: Diagnosis not present

## 2014-09-04 DIAGNOSIS — Z7952 Long term (current) use of systemic steroids: Secondary | ICD-10-CM | POA: Diagnosis not present

## 2014-09-07 ENCOUNTER — Telehealth: Payer: Self-pay | Admitting: Internal Medicine

## 2014-09-07 MED ORDER — LISINOPRIL 20 MG PO TABS: 20.0000 mg | ORAL_TABLET | Freq: Every morning | ORAL | Status: DC

## 2014-09-07 NOTE — Telephone Encounter (Signed)
Pt wife called looking for a refill on lisinopril (PRINIVIL,ZESTRIL) 20 MG tablet [id number]   [facility] on [address]

## 2014-09-07 NOTE — Telephone Encounter (Signed)
Called pt no answer LMOM refill [facility] to [facility].../lmb

## 2014-09-24 DIAGNOSIS — H34811 Central retinal vein occlusion, right eye: Secondary | ICD-10-CM | POA: Diagnosis not present

## 2014-09-24 DIAGNOSIS — H3581 Retinal edema: Secondary | ICD-10-CM | POA: Diagnosis not present

## 2014-10-08 ENCOUNTER — Ambulatory Visit (INDEPENDENT_AMBULATORY_CARE_PROVIDER_SITE_OTHER): Payer: Medicare Other | Admitting: Internal Medicine

## 2014-10-08 ENCOUNTER — Other Ambulatory Visit (INDEPENDENT_AMBULATORY_CARE_PROVIDER_SITE_OTHER): Payer: Medicare Other

## 2014-10-08 ENCOUNTER — Encounter: Payer: Self-pay | Admitting: Internal Medicine

## 2014-10-08 VITALS — BP 124/76 | HR 76 | Temp 97.5°F | Resp 18 | Wt 242.0 lb

## 2014-10-08 DIAGNOSIS — E079 Disorder of thyroid, unspecified: Secondary | ICD-10-CM

## 2014-10-08 DIAGNOSIS — I10 Essential (primary) hypertension: Secondary | ICD-10-CM

## 2014-10-08 DIAGNOSIS — R197 Diarrhea, unspecified: Secondary | ICD-10-CM

## 2014-10-08 DIAGNOSIS — E1140 Type 2 diabetes mellitus with diabetic neuropathy, unspecified: Secondary | ICD-10-CM | POA: Diagnosis not present

## 2014-10-08 DIAGNOSIS — M1000 Idiopathic gout, unspecified site: Secondary | ICD-10-CM

## 2014-10-08 DIAGNOSIS — E669 Obesity, unspecified: Secondary | ICD-10-CM | POA: Diagnosis not present

## 2014-10-08 LAB — BASIC METABOLIC PANEL
BUN: 18 mg/dL (ref 6–23)
CALCIUM: 9.3 mg/dL (ref 8.4–10.5)
CO2: 27 mEq/L (ref 19–32)
Chloride: 101 mEq/L (ref 96–112)
Creatinine, Ser: 0.98 mg/dL (ref 0.40–1.50)
GFR: 79.91 mL/min (ref >60.00)
Glucose, Bld: 283 mg/dL — ABNORMAL HIGH (ref 70–99)
POTASSIUM: 4.8 meq/L (ref 3.5–5.1)
SODIUM: 132 meq/L — AB (ref 135–145)

## 2014-10-08 LAB — HEMOGLOBIN A1C: HEMOGLOBIN A1C: 8.6 % — AB (ref 4.6–6.5)

## 2014-10-08 MED ORDER — OMEPRAZOLE 40 MG PO CPDR: 40.0000 mg | DELAYED_RELEASE_CAPSULE | Freq: Every day | ORAL | Status: DC

## 2014-10-08 MED ORDER — BETAMETHASONE DIPROPIONATE AUG 0.05 % EX OINT: TOPICAL_OINTMENT | Freq: Two times a day (BID) | CUTANEOUS | Status: DC

## 2014-10-08 NOTE — Assessment & Plan Note (Signed)
On Omeprazole 

## 2014-10-08 NOTE — Assessment & Plan Note (Signed)
On Allopurinol 

## 2014-10-08 NOTE — Progress Notes (Signed)
   Subjective:     HPI  F/u HTN, PVD, DM, hypothyroidism, Vit D def F/u occip HAs - better. BP is ok at home F/u R shoulder bl pain after putting up tree decorations - better  BP Readings from Last 3 Encounters:  10/08/14 124/76  07/26/14 188/64  07/10/14 160/90    Wt Readings from Last 3 Encounters:  10/08/14 242 lb (109.77 kg)  07/26/14 238 lb 9.6 oz (108.228 kg)  07/10/14 237 lb (107.502 kg)     Review of Systems  Constitutional: Negative for appetite change, fatigue and unexpected weight change.  HENT: Negative for congestion, nosebleeds, sneezing, sore throat and trouble swallowing.   Eyes: Negative for itching and visual disturbance.  Respiratory: Negative for cough.   Cardiovascular: Negative for chest pain, palpitations and leg swelling.  Gastrointestinal: Negative for nausea, diarrhea, blood in stool and abdominal distention.  Genitourinary: Negative for frequency and hematuria.  Musculoskeletal: Negative for back pain, joint swelling, gait problem and neck pain.  Skin: Negative for rash.  Neurological: Negative for dizziness, tremors, speech difficulty and weakness.  Psychiatric/Behavioral: Negative for sleep disturbance, dysphoric mood and agitation. The patient is not nervous/anxious.        Objective:   Physical Exam  Constitutional: He is oriented to person, place, and time. He appears well-developed. No distress.  NAD  HENT:  Mouth/Throat: Oropharynx is clear and moist.  Eyes: Conjunctivae are normal. Pupils are equal, round, and reactive to light.  Neck: Normal range of motion. No JVD present. No thyromegaly present.  Cardiovascular: Normal rate, regular rhythm, normal heart sounds and intact distal pulses.  Exam reveals no gallop and no friction rub.   No murmur heard. Pulmonary/Chest: Effort normal and breath sounds normal. No respiratory distress. He has no wheezes. He has no rales. He exhibits no tenderness.  Abdominal: Soft. Bowel sounds are  normal. He exhibits no distension and no mass. There is no tenderness. There is no rebound and no guarding.  Musculoskeletal: Normal range of motion. He exhibits no edema or tenderness.  Lymphadenopathy:    He has no cervical adenopathy.  Neurological: He is alert and oriented to person, place, and time. He has normal reflexes. No cranial nerve deficit. He exhibits normal muscle tone. He displays a negative Romberg sign. Coordination and gait normal.  No meningeal signs  Skin: Skin is warm and dry. Rash noted.  Psychiatric: He has a normal mood and affect. His behavior is normal. Judgment and thought content normal.  psoriatc patches - trunk, extr-s, scalp R sh blade is less tender in suprsinat area  Lab Results  Component Value Date   WBC 5.2 07/26/2014   HGB 12.8* 07/26/2014   HCT 37.6* 07/26/2014   PLT 171 07/26/2014   GLUCOSE 417* 07/26/2014   CHOL 146 03/19/2014   TRIG 354.0* 03/19/2014   HDL 28.20* 03/19/2014   LDLDIRECT 77.2 03/19/2014   LDLCALC -7* 11/07/2013   ALT 13 07/26/2014   AST 13 07/26/2014   NA 137 07/26/2014   K 5.1 07/26/2014   CL 104 07/10/2014   CREATININE 1.1 07/26/2014   BUN 17.1 07/26/2014   CO2 26 07/26/2014   TSH 1.23 11/07/2013   PSA 0.50 11/07/2013   INR 1.02 08/28/2009   HGBA1C 7.6* 07/10/2014   MICROALBUR 1.2 05/16/2013         Assessment & Plan:

## 2014-10-08 NOTE — Assessment & Plan Note (Signed)
Chronic on Metformin, Glipizide

## 2014-10-08 NOTE — Assessment & Plan Note (Signed)
Loose wt 

## 2014-10-08 NOTE — Progress Notes (Signed)
Pre visit review using our clinic review tool, if applicable. No additional management support is needed unless otherwise documented below in the visit note. 

## 2014-10-08 NOTE — Patient Instructions (Signed)
Milk free diet - no cheese for 2 weeks Hold Metformin for 2 weeks

## 2014-10-08 NOTE — Assessment & Plan Note (Signed)
Milk free diet Hold Metformin

## 2014-10-08 NOTE — Assessment & Plan Note (Signed)
Chronic - on Lisinopril

## 2014-10-11 ENCOUNTER — Inpatient Hospital Stay (HOSPITAL_COMMUNITY)
Admission: EM | Admit: 2014-10-11 | Discharge: 2014-10-13 | DRG: 310 | Disposition: A | Discharge status: Home / Self Care | Payer: Medicare Other | Attending: Cardiology | Admitting: Cardiology

## 2014-10-11 ENCOUNTER — Emergency Department (HOSPITAL_COMMUNITY): Payer: Medicare Other

## 2014-10-11 ENCOUNTER — Encounter (HOSPITAL_COMMUNITY): Payer: Self-pay | Admitting: *Deleted

## 2014-10-11 ENCOUNTER — Telehealth: Payer: Self-pay | Admitting: Internal Medicine

## 2014-10-11 DIAGNOSIS — R079 Chest pain, unspecified: Secondary | ICD-10-CM | POA: Diagnosis not present

## 2014-10-11 DIAGNOSIS — Z8249 Family history of ischemic heart disease and other diseases of the circulatory system: Secondary | ICD-10-CM | POA: Diagnosis not present

## 2014-10-11 DIAGNOSIS — E039 Hypothyroidism, unspecified: Secondary | ICD-10-CM | POA: Diagnosis present

## 2014-10-11 DIAGNOSIS — E669 Obesity, unspecified: Secondary | ICD-10-CM | POA: Diagnosis present

## 2014-10-11 DIAGNOSIS — R7989 Other specified abnormal findings of blood chemistry: Secondary | ICD-10-CM | POA: Insufficient documentation

## 2014-10-11 DIAGNOSIS — I4891 Unspecified atrial fibrillation: Secondary | ICD-10-CM | POA: Diagnosis not present

## 2014-10-11 DIAGNOSIS — Z833 Family history of diabetes mellitus: Secondary | ICD-10-CM

## 2014-10-11 DIAGNOSIS — E1165 Type 2 diabetes mellitus with hyperglycemia: Secondary | ICD-10-CM | POA: Diagnosis not present

## 2014-10-11 DIAGNOSIS — R0602 Shortness of breath: Secondary | ICD-10-CM | POA: Diagnosis not present

## 2014-10-11 DIAGNOSIS — I4892 Unspecified atrial flutter: Secondary | ICD-10-CM | POA: Diagnosis present

## 2014-10-11 DIAGNOSIS — R0789 Other chest pain: Secondary | ICD-10-CM | POA: Diagnosis not present

## 2014-10-11 DIAGNOSIS — Z87891 Personal history of nicotine dependence: Secondary | ICD-10-CM

## 2014-10-11 DIAGNOSIS — R778 Other specified abnormalities of plasma proteins: Secondary | ICD-10-CM | POA: Insufficient documentation

## 2014-10-11 DIAGNOSIS — Z823 Family history of stroke: Secondary | ICD-10-CM | POA: Diagnosis not present

## 2014-10-11 DIAGNOSIS — E781 Pure hyperglyceridemia: Secondary | ICD-10-CM | POA: Diagnosis not present

## 2014-10-11 DIAGNOSIS — I499 Cardiac arrhythmia, unspecified: Secondary | ICD-10-CM | POA: Diagnosis not present

## 2014-10-11 DIAGNOSIS — Z801 Family history of malignant neoplasm of trachea, bronchus and lung: Secondary | ICD-10-CM

## 2014-10-11 DIAGNOSIS — I471 Supraventricular tachycardia: Secondary | ICD-10-CM | POA: Diagnosis not present

## 2014-10-11 DIAGNOSIS — M1990 Unspecified osteoarthritis, unspecified site: Secondary | ICD-10-CM | POA: Diagnosis present

## 2014-10-11 DIAGNOSIS — I10 Essential (primary) hypertension: Secondary | ICD-10-CM | POA: Diagnosis not present

## 2014-10-11 DIAGNOSIS — I248 Other forms of acute ischemic heart disease: Secondary | ICD-10-CM | POA: Diagnosis not present

## 2014-10-11 LAB — I-STAT TROPONIN, ED: Troponin i, poc: 0.01 ng/mL (ref 0.00–0.08)

## 2014-10-11 LAB — BASIC METABOLIC PANEL
Anion gap: 14 (ref 5–15)
BUN: 22 mg/dL (ref 6–23)
CHLORIDE: 103 mmol/L (ref 96–112)
CO2: 19 mmol/L (ref 19–32)
Calcium: 9 mg/dL (ref 8.4–10.5)
Creatinine, Ser: 0.95 mg/dL (ref 0.50–1.35)
GFR calc Af Amer: >90 mL/min (ref >90)
GFR, EST NON AFRICAN AMERICAN: 81 mL/min — AB (ref >90)
Glucose, Bld: 312 mg/dL — ABNORMAL HIGH (ref 70–99)
Potassium: 5.7 mmol/L — ABNORMAL HIGH (ref 3.5–5.1)
Sodium: 136 mmol/L (ref 135–145)

## 2014-10-11 LAB — TROPONIN I: Troponin I: <0.03 ng/mL (ref <0.031)

## 2014-10-11 LAB — TSH: TSH: 1.441 u[IU]/mL (ref 0.350–4.500)

## 2014-10-11 LAB — CBC
HEMATOCRIT: 35.9 % — AB (ref 39.0–52.0)
HEMOGLOBIN: 13.2 g/dL (ref 13.0–17.0)
MCH: 30.8 pg (ref 26.0–34.0)
MCHC: 36.8 g/dL — ABNORMAL HIGH (ref 30.0–36.0)
MCV: 83.7 fL (ref 78.0–100.0)
PLATELETS: 210 10*3/uL (ref 150–400)
RBC: 4.29 MIL/uL (ref 4.22–5.81)
RDW: 16 % — ABNORMAL HIGH (ref 11.5–15.5)
WBC: 6.4 10*3/uL (ref 4.0–10.5)

## 2014-10-11 LAB — HEPARIN LEVEL (UNFRACTIONATED): Heparin Unfractionated: <0.1 IU/mL — ABNORMAL LOW (ref 0.30–0.70)

## 2014-10-11 LAB — MRSA PCR SCREENING: MRSA BY PCR: NEGATIVE

## 2014-10-11 LAB — MAGNESIUM: Magnesium: 2 mg/dL (ref 1.5–2.5)

## 2014-10-11 LAB — PROTIME-INR
INR: 0.95 (ref 0.00–1.49)
Prothrombin Time: 12.8 seconds (ref 11.6–15.2)

## 2014-10-11 LAB — GLUCOSE, CAPILLARY: GLUCOSE-CAPILLARY: 244 mg/dL — AB (ref 70–99)

## 2014-10-11 LAB — BRAIN NATRIURETIC PEPTIDE: B NATRIURETIC PEPTIDE 5: 46 pg/mL (ref 0.0–100.0)

## 2014-10-11 MED ORDER — HEPARIN (PORCINE) IN NACL 100-0.45 UNIT/ML-% IJ SOLN
2300.0000 [IU]/h | INTRAMUSCULAR | Status: DC
  Administered 2014-10-11: 1300 [IU]/h via INTRAVENOUS
  Administered 2014-10-12: 1500 [IU]/h via INTRAVENOUS
  Administered 2014-10-12: 2100 [IU]/h via INTRAVENOUS
  Administered 2014-10-13: 2300 [IU]/h via INTRAVENOUS
  Filled 2014-10-11 (×7): qty 250

## 2014-10-11 MED ORDER — HEPARIN BOLUS VIA INFUSION
4000.0000 [IU] | Freq: Once | INTRAVENOUS | Status: AC
  Administered 2014-10-11 (×2): 4000 [IU] via INTRAVENOUS
  Filled 2014-10-11: qty 4000

## 2014-10-11 MED ORDER — FLECAINIDE ACETATE 100 MG PO TABS
300.0000 mg | ORAL_TABLET | Freq: Once | ORAL | Status: AC
  Administered 2014-10-11: 300 mg via ORAL
  Filled 2014-10-11: qty 3

## 2014-10-11 MED ORDER — ASPIRIN 81 MG PO CHEW
324.0000 mg | CHEWABLE_TABLET | Freq: Once | ORAL | Status: AC
  Administered 2014-10-11: 324 mg via ORAL
  Filled 2014-10-11: qty 4

## 2014-10-11 MED ORDER — PANTOPRAZOLE SODIUM 40 MG PO TBEC
40.0000 mg | DELAYED_RELEASE_TABLET | Freq: Every day | ORAL | Status: DC
  Administered 2014-10-11 – 2014-10-13 (×3): 40 mg via ORAL
  Filled 2014-10-11 (×3): qty 1

## 2014-10-11 MED ORDER — SODIUM CHLORIDE 0.9 % IV BOLUS (SEPSIS)
250.0000 mL | Freq: Once | INTRAVENOUS | Status: AC
Start: 2014-10-11 — End: 2014-10-11
  Administered 2014-10-11: 250 mL via INTRAVENOUS

## 2014-10-11 MED ORDER — GEMFIBROZIL 600 MG PO TABS
600.0000 mg | ORAL_TABLET | Freq: Two times a day (BID) | ORAL | Status: DC
  Administered 2014-10-12 (×2): 600 mg via ORAL
  Filled 2014-10-11 (×5): qty 1

## 2014-10-11 MED ORDER — CLOBETASOL PROPIONATE 0.05 % EX SOLN: 1.0000 "application " | Freq: Two times a day (BID) | CUTANEOUS | Status: DC

## 2014-10-11 MED ORDER — DILTIAZEM HCL 100 MG IV SOLR
5.0000 mg/h | INTRAVENOUS | Status: DC
  Administered 2014-10-11: 5 mg/h via INTRAVENOUS
  Administered 2014-10-11: 12.5 mg/h via INTRAVENOUS
  Administered 2014-10-11: 15 mg/h via INTRAVENOUS
  Administered 2014-10-12: 5 mg/h via INTRAVENOUS

## 2014-10-11 MED ORDER — VITAMIN D3 25 MCG (1000 UNIT) PO TABS
1000.0000 [IU] | ORAL_TABLET | Freq: Every day | ORAL | Status: DC
  Administered 2014-10-12 – 2014-10-13 (×2): 1000 [IU] via ORAL
  Filled 2014-10-11 (×2): qty 1

## 2014-10-11 MED ORDER — FERROUS SULFATE 325 (65 FE) MG PO TABS
325.0000 mg | ORAL_TABLET | Freq: Three times a day (TID) | ORAL | Status: DC
  Administered 2014-10-12 – 2014-10-13 (×4): 325 mg via ORAL
  Filled 2014-10-11 (×7): qty 1

## 2014-10-11 MED ORDER — PREDNISONE 5 MG PO TABS
5.0000 mg | ORAL_TABLET | Freq: Every morning | ORAL | Status: DC
  Administered 2014-10-12 – 2014-10-13 (×2): 5 mg via ORAL
  Filled 2014-10-11 (×2): qty 1

## 2014-10-11 MED ORDER — ONDANSETRON HCL 4 MG/2ML IJ SOLN: 4.0000 mg | Freq: Four times a day (QID) | INTRAMUSCULAR | Status: DC | PRN

## 2014-10-11 MED ORDER — ACETAMINOPHEN 325 MG PO TABS
650.0000 mg | ORAL_TABLET | Freq: Three times a day (TID) | ORAL | Status: DC | PRN
  Administered 2014-10-13: 650 mg via ORAL
  Filled 2014-10-11 (×2): qty 2

## 2014-10-11 MED ORDER — ALLOPURINOL 100 MG PO TABS
200.0000 mg | ORAL_TABLET | Freq: Every morning | ORAL | Status: DC
  Administered 2014-10-12 – 2014-10-13 (×2): 200 mg via ORAL
  Filled 2014-10-11 (×2): qty 2

## 2014-10-11 MED ORDER — DILTIAZEM LOAD VIA INFUSION
10.0000 mg | Freq: Once | INTRAVENOUS | Status: AC
  Administered 2014-10-11 (×2): 10 mg via INTRAVENOUS

## 2014-10-11 MED ORDER — VITAMIN D (ERGOCALCIFEROL) 1.25 MG (50000 UNIT) PO CAPS
50000.0000 [IU] | ORAL_CAPSULE | ORAL | Status: DC
  Administered 2014-10-13: 50000 [IU] via ORAL
  Filled 2014-10-11: qty 1

## 2014-10-11 MED ORDER — INSULIN ASPART 100 UNIT/ML ~~LOC~~ SOLN
0.0000 [IU] | Freq: Three times a day (TID) | SUBCUTANEOUS | Status: DC
  Administered 2014-10-12: 11 [IU] via SUBCUTANEOUS
  Administered 2014-10-12 (×2): 7 [IU] via SUBCUTANEOUS

## 2014-10-11 MED ORDER — SODIUM CHLORIDE 0.9 % IV BOLUS (SEPSIS)
250.0000 mL | Freq: Once | INTRAVENOUS | Status: AC
  Administered 2014-10-11: 250 mL via INTRAVENOUS

## 2014-10-11 MED ORDER — LEVOTHYROXINE SODIUM 150 MCG PO TABS
150.0000 ug | ORAL_TABLET | Freq: Every day | ORAL | Status: DC
  Administered 2014-10-12: 150 ug via ORAL
  Filled 2014-10-11 (×3): qty 1

## 2014-10-11 NOTE — ED Notes (Signed)
[place], [place] notified Re: pts HR not improving, verbal order to continue monitoring

## 2014-10-11 NOTE — Telephone Encounter (Signed)
PLEASE NOTE: All timestamps contained within this report are represented as [place] Standard Time. CONFIDENTIALTY NOTICE: This fax transmission is intended only for the addressee. It contains information that is legally privileged, confidential or otherwise protected from use or disclosure. If you are not the intended recipient, you are strictly prohibited from reviewing, disclosing, copying using or disseminating any of this information or taking any action in reliance on or regarding this information. If you have received this fax in error, please notify us immediately by telephone so that we can arrange for its return to [name]. Phone: [phone], Toll-Free: [phone], Fax: [phone] Page: 1 of 1 Call Id: [id number] [address] [place] [facility] - Client [place] Patient Name: Michael [name] DOB: April 27, 1941 Initial Comment Caller states husband was had a normal morning, but after walk, heart was racing. 217/117 pulse 116. After resting 152/103 hr 32, then laying down 128/86 hr 155. Nurse Assessment Nurse: [name], RN, [name] Date/Time [name] Time): 10/11/2014 11:22:56 AM Confirm and document reason for call. If symptomatic, describe symptoms. ---Caller states husband was had a normal morning, but after walk, heart was racing. 217/117 pulse 116. After resting 152/103 hr 132, then laying down 128/86 hr 155. - she checked his again and his BP is 154/111 and heart rate 134 and then up to 148 in just a second - states is heart is skipping beats and feel like it is about to beat out of his chest has never felt this way before - he is short of breath states he feels like he needs to burp and he can't feels a lot of pressure in chest and he can not get relief Has the patient traveled out of the country within the last 30 days? ---Not Applicable Does the patient require triage? ---Yes Related visit to physician within the last 2 weeks? ---N/A Does  the PT have any chronic conditions? (i.e. diabetes, asthma, etc.) ---Yes List chronic conditions. ---see record known: diabetic high blood pressure Guidelines Guideline Title Affirmed Question Affirmed Notes Heart Rate and Heartbeat Questions Sounds like a life-threatening emergency to the triager Final Disposition User Clinical Call [name], RN, [place] states EMS is there now - his BP is 150/122 heart rate 150 she did check his BS and it was 363 - they are going to take him to hospital - advised [name] to call us back if she needs anything we will make the office aware he is going to hospital via ambulance

## 2014-10-11 NOTE — ED Notes (Signed)
Pt in from home via [facility], pt noted to have fast HR 158, EMS administered 6 mg Adenisone & the 12 mg Adenisone with rhythm going from SVT to appearing to go to A flutter, pt rcvd 10 mg bolus of cardiazem x 2, pt was started on 5 mg Cardiazem drip, pt A&O x4, follows commands, speaks in complete sentences, denies CP, initially SOB with exertion, pt hypertensive at home

## 2014-10-11 NOTE — H&P (Signed)
Patient ID: Michael [name] MRN: [id number], DOB/AGE: [birth year]   Admit date: 10/11/2014   Primary Physician: [name], MD Primary Cardiologist: Dr. Mare Ferrari   Pt. Profile:   74 year old male with past medical history of HTN, HLD, DM, history of hypothyroidism, history of iron deficiency anemia as result of proximal small bowel AVM present with new onset afib with RVR  Problem List  Past Medical History  Diagnosis Date  . Diabetes mellitus   . Hypertension   . Hyperlipidemia   . Gout   . Arthritis   . Thyroid disease     hypothyroidism  . Personal history of colonic polyps 2007, 2008    adenoma each time, largest 12 mm in 2007  . Iron deficiency anemia   . AVM (arteriovenous malformation) of colon   . Allergy   . H/O transfusion of whole blood     Past Surgical History  Procedure Laterality Date  . Rotator cuff repair      left  . Knee arthroscopy      right  . Colonoscopy w/ polypectomy  04/09/2006    12 mm adenoma  . Colonoscopy w/ polypectomy  06/17/2007    5 mm adenoma  . Colonoscopy  02/10/2011    internal hemorrhoids  . Esophagogastroduodenoscopy  12/23/2011    Procedure: ESOPHAGOGASTRODUODENOSCOPY (EGD);  Surgeon: Irene Shipper, MD;  Location: [facility];  Service: Endoscopy;  Laterality: N/A;  with small bowel bx's  . Givens capsule study  12/28/2011  . Laparoscopic appendectomy N/A 02/09/2014    Procedure: APPENDECTOMY LAPAROSCOPIC;  Surgeon: Leighton Ruff, MD;  Location: [facility];  Service: General;  Laterality: N/A;  . Appendectomy  02/09/2014     Allergies  Allergies  Allergen Reactions  . Percocet [Oxycodone-Acetaminophen] Nausea And Vomiting    HPI  The patient is a pleasant 74 year old male with past medical history of HTN, HLD, DM, history of hypothyroidism, history of iron deficiency anemia as result of proximal small bowel AVM. Patient underwent by pill endoscopy in 2013 by Dr. [name] which diagnosed him with proximal small bowel AVM.  He has not had recurrent GI bleed since that time. His hemoglobin has since returned to normal range. According to the patient, he has been compliant with thyroid medication and has not had any problem with his thyroid recently. He denies any recent exertional chest discomfort, shortness breath, lower extremity edema, orthopnea or paroxysmal nocturnal dyspnea. He has not had any recent signs of infection. He drinks one cup of coffee per day. He does not drink energy drink. He denies excessive EtOH use. He denies any prior cardiac history or history of arrhythmia. 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  While walking his dog around 10:30 AM on 10/11/2014, patient had some shortness of breath followed by palpitation feeling. He decided to seek medical attention at [facility]. On arrival significant laboratory shows hemoglobin 13.2. Negative troponin. Chest x-ray showed increased pulmonary vascular congestion without overt pulmonary edema, normal heart size, aortic atherosclerosis. His rhythm was originally similar to SVT, however after 2 doses of adenosine, it appears he has underlying atrial fibrillation. He was placed on diltiazem drip without significant control of his heart rate. He is currently on 12.5 of diltiazem and his heart rate is in the 110 to 140s. Cardiology has been consulted for new onset atrial fibrillation with difficult to controled HR.  Home Medications  Prior to Admission medications  Medication Sig Start Date End Date Taking? Authorizing Provider  acetaminophen (TYLENOL) 650 MG CR tablet Take 650 mg by mouth every 8 (eight) hours as needed. Take two tablets by mouth as needed    Historical Provider, MD  allopurinol (ZYLOPRIM) 100 MG tablet Take 200 mg by mouth every morning.     Historical Provider, MD  augmented betamethasone dipropionate (DIPROLENE) 0.05 % ointment Apply topically 2 (two) times daily. 10/08/14   Aleksei  [name] V, MD  Blood Glucose Monitoring Suppl (ONE TOUCH ULTRA 2) W/DEVICE KIT Use as directed Dx E11.9 05/18/14   Aleksei [name] V, MD  carvedilol (COREG) 12.5 MG tablet Take 1 tablet (12.5 mg total) by mouth 2 (two) times daily with a meal. 11/06/13   Aleksei [name] V, MD  cholecalciferol (VITAMIN D) 1000 UNITS tablet Take 1 tablet (1,000 Units total) by mouth daily. 11/06/13 11/06/14  Aleksei [name] V, MD  clobetasol (TEMOVATE) 0.05 % external solution Apply 1 application topically 2 (two) times daily. On scalp 03/19/14   [name] V, MD  ergocalciferol (VITAMIN D2) 50000 UNITS capsule Take 1 capsule (50,000 Units total) by mouth once a week. 11/08/13   Aleksei [name] V, MD  ferrous sulfate 325 (65 FE) MG tablet Take 325 mg by mouth 3 (three) times daily with meals.    Historical Provider, MD  gemfibrozil (LOPID) 600 MG tablet Take 600 mg by mouth 2 (two) times daily before a meal.    Historical Provider, MD  glipiZIDE (GLUCOTROL XL) 10 MG 24 hr tablet Take 1 tablet (10 mg total) by mouth every morning. 07/10/14   Aleksei [name] V, MD  glucose blood (ONE TOUCH ULTRA TEST) test strip Use as instructed 05/18/14   [name] V, MD  Lancets (ONETOUCH ULTRASOFT) lancets Use to check blood sugars daily 05/18/14   Aleksei [name] V, MD  levothyroxine (SYNTHROID, LEVOTHROID) 150 MCG tablet Take 1 tablet (150 mcg total) by mouth daily before breakfast. 06/25/14   Aleksei [name] V, MD  lisinopril (PRINIVIL,ZESTRIL) 20 MG tablet Take 1 tablet (20 mg total) by mouth every morning. 09/07/14   Aleksei [name] V, MD  metFORMIN (GLUCOPHAGE) 1000 MG tablet TAKE 1 TABLET BY MOUTH TWICE DAILY WITH A MEAL 07/10/14   Aleksei [name] V, MD  omeprazole (PRILOSEC) 40 MG capsule Take 1 capsule (40 mg total) by mouth daily. 10/08/14   Aleksei [name] V, MD  predniSONE (DELTASONE) 5 MG tablet Take 5 mg by mouth every morning.     Historical Provider, MD    Family History  Family  History  Problem Relation Age of Onset  . Malignant hyperthermia Neg Hx   . Heart attack Father   . Lung cancer Father   . Diabetes Father   . Stroke Father   . Hypertension Father   . Stroke Mother   . Hypertension Mother   . Cancer Brother     liver  . Heart disease Brother     chf    Social History  History   Social History  . Marital Status: Married    Spouse Name: N/A  . Number of Children: N/A  . Years of Education: 12   Occupational History  .  Retired    Social History Main Topics  . Smoking status: Former Smoker    Quit date: 03/29/2006  . Smokeless tobacco: Never Used  . Alcohol Use: 0.6 oz/week    1 Cans of beer per week     Comment: occasional alcohol intake  .  Drug Use: No  . Sexual Activity: Not Currently   Other Topics Concern  . Not on file   Social History Narrative   Regular exercise-no   Caffeine Use-yes     Review of Systems General:  No chills, fever, night sweats or weight changes.  Cardiovascular:  No chest pain, dyspnea on exertion, edema, orthopnea, paroxysmal nocturnal dyspnea. +palpitations Dermatological: No rash, lesions/masses Respiratory: No coug + dyspnea Urologic: No hematuria, dysuria Abdominal:   No vomiting, diarrhea, bright red blood per rectum, melena, or hematemesis +nausea Neurologic:  No visual changes, wkns, changes in mental status. All other systems reviewed and are otherwise negative except as noted above.  Physical Exam  Blood pressure 125/98, pulse 96, temperature 98.8 F (37.1 C), temperature source Oral, resp. rate 21, SpO2 95 %.  General: Pleasant, NAD Psych: Normal affect. Neuro: Alert and oriented X 3. Moves all extremities spontaneously. HEENT: Normal  Neck: Supple without bruits or JVD. Lungs:  Resp regular and unlabored, CTA. Heart: tachycardic. no s3, s4. 2/6 systolic Murmur Abdomen: Soft, non-tender, non-distended, BS + x 4.  Extremities: No clubbing, cyanosis or edema. DP/PT/Radials 2+ and  equal bilaterally.  Labs  Troponin [facility] Test)  Recent Labs  10/11/14 1239  TROPIPOC 0.01   No results for input(s): CKTOTAL, CKMB, TROPONINI in the last 72 hours. Lab Results  Component Value Date   WBC 6.4 10/11/2014   HGB 13.2 10/11/2014   HCT 35.9* 10/11/2014   MCV 83.7 10/11/2014   PLT 210 10/11/2014    Recent Labs Lab 10/08/14 1122  NA 132*  K 4.8  CL 101  CO2 27  BUN 18  CREATININE 0.98  CALCIUM 9.3  GLUCOSE 283*   Lab Results  Component Value Date   CHOL 146 03/19/2014   HDL 28.20* 03/19/2014   LDLCALC -7* 11/07/2013   TRIG 354.0* 03/19/2014   No results found for: DDIMER   Radiology/Studies  Dg Chest Port 1 View  10/11/2014   CLINICAL DATA:  74 year old male with irregular heart rate and shortness of breath  EXAM: PORTABLE CHEST - 1 VIEW  COMPARISON:  Prior chest x-ray 09/10/2012  FINDINGS: Stable cardiac and mediastinal contours. Heart is within normal limits for size. Atherosclerotic calcification present in the transverse aorta. Increased pulmonary vascular congestion without overt edema. Extensive linear artifact projects over the chest in the form of numerous cardiac leads. No evidence of pneumothorax, pleural effusion or focal airspace consolidation. No acute osseous abnormality.  IMPRESSION: 1. Increased pulmonary vascular congestion without overt pulmonary edema. 2. The heart remains within normal limits for size. 3. Aortic atherosclerosis.   Electronically Signed   By: Jacqulynn Cadet M.D.   On: 10/11/2014 13:15    ECG  Atrial flutter ablation with RVR   Echocardiogram 08/2012  - Left ventricle: The cavity size was normal. There was moderate concentric hypertrophy. Systolic function was hyperdynamic. The estimated ejection fraction was in the range of 70% to 75%. Wall motion was normal; there were no regional wall motion abnormalities. - Aortic valve: Possibly reduced annular size as the result ofmoderate calcification  of theannulus. Elevated velocity in the LVOT/proximal aorta suggestsvery mild stenosis, possibly at the valvular or subvalvular level. Trivial regurgitation. Valve area: 1.57cm^2(VTI). Valve area: 1.52cm^2 (Vmax). - Mitral valve: Calcified annulus. - Left atrium: The atrium was mildly dilated. - Atrial septum: No defect or patent foramen ovale was identified.    ASSESSMENT AND PLAN  1. New onset A. fib with RVR  - CHA2DS2-Vasc score  3 (age, HTN, DM)  - check TSH  - patient felt the symptom abruptly started around 10:30am, was in his USOH prior to that. HR poorly controlled despite IV diltiazem and adenosine  - will discuss with Dr. [place], may consider flecainide to try to convert him.  - on coreg at home, consider addition of metoprolol to better rate control  - He is a candidate for NOAC based on renal function, however h/o GIB with AVM problematic, will discuss with MD  2. HTN 3. HLD 4. DM 5. history of hypothyroidism 6. history of iron deficiency anemia as result of proximal small bowel AVM 7. Murmur: obtain repeat echo  Michael Corrigan, PA-C 10/11/2014, 1:33 PM Patient seen and examined and history reviewed. Agree with above findings and plan. 74 yo WM presents with sudden onset of tachycardiac and dyspnea at 10:30 today. No prior symptoms. He is on chronic beta blocker. In ED found to be in AFib with RVR. Rate difficult to control. He still complains of dyspnea. He did eat an egg sandwich prior to coming to ED. He has no prior cardiac history. CHAD-Vasc score of 3. Currently on IV diltiazem at 12.5 mg/hr with rate still 160. Exam reveals clear lung fields. Systolic murmur 2/6 at the apex. ECG shows LVH with QRS widening and repolarization abnormality. Will admit to step down unit. Check serial troponins. Continue efforts at rate control with IV diltiazem. Titrate as long as systolic BP >93. Hold Coreg with low BP.  Continue IV heparin. Unfortunately patient has eaten  today otherwise would consider DCCV. Will try Flecainide 300 mg oral x 1 to see if we can convert. If not will keep NPO after MN and plan DCCV tomorrow. Will check Echo once HR is slower. It will not be a good study at current HR.  Shaquira Moroz [place], [place] 10/11/2014 2:01 PM

## 2014-10-11 NOTE — ED Provider Notes (Signed)
CSN: [id number]     Arrival date & time 10/11/14  1218 History   First MD Initiated Contact with Patient 10/11/14 1218     Chief Complaint  Patient presents with  . Irregular Heart Beat     (Consider location/radiation/quality/duration/timing/severity/associated sxs/prior Treatment) HPI Comments: Patient is a 74 yo M PMHx significant for DM, HTN, HLD presenting to the ED for evaluation of palpitations that began around 10:30AM this morning with associated dyspnea on exertion and chest tightness. Patient states he is continuing to have palpitations, but his DOE and CP have improved. Denies any history of similar symptoms. Patient states that he had a stress test many years ago    Past Medical History  Diagnosis Date  . Diabetes mellitus   . Hypertension   . Hyperlipidemia   . Gout   . Arthritis   . Thyroid disease     hypothyroidism  . Personal history of colonic polyps 2007, 2008    adenoma each time, largest 12 mm in 2007  . Iron deficiency anemia   . AVM (arteriovenous malformation) of colon   . Allergy   . H/O transfusion of whole blood    Past Surgical History  Procedure Laterality Date  . Rotator cuff repair      left  . Knee arthroscopy      right  . Colonoscopy w/ polypectomy  04/09/2006    12 mm adenoma  . Colonoscopy w/ polypectomy  06/17/2007    5 mm adenoma  . Colonoscopy  02/10/2011    internal hemorrhoids  . Esophagogastroduodenoscopy  12/23/2011    Procedure: ESOPHAGOGASTRODUODENOSCOPY (EGD);  Surgeon: Irene Shipper, MD;  Location: [facility];  Service: Endoscopy;  Laterality: N/A;  with small bowel bx's  . Givens capsule study  12/28/2011  . Laparoscopic appendectomy N/A 02/09/2014    Procedure: APPENDECTOMY LAPAROSCOPIC;  Surgeon: Leighton Ruff, MD;  Location: [facility];  Service: General;  Laterality: N/A;  . Appendectomy  02/09/2014   Family History  Problem Relation Age of Onset  . Malignant hyperthermia Neg Hx   . Heart attack Father   . Lung cancer  Father   . Diabetes Father   . Stroke Father   . Hypertension Father   . Stroke Mother   . Hypertension Mother   . Cancer Brother     liver  . Heart disease Brother     chf   History  Substance Use Topics  . Smoking status: Former Smoker    Quit date: 03/29/2006  . Smokeless tobacco: Never Used  . Alcohol Use: 0.6 oz/week    1 Cans of beer per week     Comment: occasional alcohol intake    Review of Systems  Respiratory: Positive for chest tightness and shortness of breath.   Cardiovascular: Positive for palpitations.  All other systems reviewed and are negative.     Allergies  Percocet  Home Medications   Prior to Admission medications   Medication Sig Start Date End Date Taking? Authorizing Provider  acetaminophen (TYLENOL) 650 MG CR tablet Take 650 mg by mouth every 8 (eight) hours as needed. Take two tablets by mouth as needed   Yes Historical Provider, MD  allopurinol (ZYLOPRIM) 100 MG tablet Take 200 mg by mouth every morning.    Yes Historical Provider, MD  Blood Glucose Monitoring Suppl (ONE TOUCH ULTRA 2) W/DEVICE KIT Use as directed Dx E11.9 05/18/14  Yes Aleksei [name] V, MD  carvedilol (COREG) 12.5 MG tablet Take 1 tablet (12.5  mg total) by mouth 2 (two) times daily with a meal. 11/06/13  Yes Aleksei [name] V, MD  cholecalciferol (VITAMIN D) 1000 UNITS tablet Take 1 tablet (1,000 Units total) by mouth daily. 11/06/13 11/06/14 Yes Aleksei [name] V, MD  clobetasol (TEMOVATE) 0.05 % external solution Apply 1 application topically 2 (two) times daily. On scalp 03/19/14  Yes Aleksei [name] V, MD  ergocalciferol (VITAMIN D2) 50000 UNITS capsule Take 1 capsule (50,000 Units total) by mouth once a week. 11/08/13  Yes Aleksei [name] V, MD  ferrous sulfate 325 (65 FE) MG tablet Take 325 mg by mouth 3 (three) times daily with meals.   Yes Historical Provider, MD  gemfibrozil (LOPID) 600 MG tablet Take 600 mg by mouth 2 (two) times daily before a meal.   Yes  Historical Provider, MD  glipiZIDE (GLUCOTROL XL) 10 MG 24 hr tablet Take 1 tablet (10 mg total) by mouth every morning. 07/10/14  Yes Aleksei [name] V, MD  glucose blood (ONE TOUCH ULTRA TEST) test strip Use as instructed 05/18/14  Yes Aleksei [name] V, MD  Lancets (ONETOUCH ULTRASOFT) lancets Use to check blood sugars daily 05/18/14  Yes Aleksei [name] V, MD  levothyroxine (SYNTHROID, LEVOTHROID) 150 MCG tablet Take 1 tablet (150 mcg total) by mouth daily before breakfast. 06/25/14  Yes Aleksei [name] V, MD  lisinopril (PRINIVIL,ZESTRIL) 20 MG tablet Take 1 tablet (20 mg total) by mouth every morning. 09/07/14  Yes Aleksei [name] V, MD  omeprazole (PRILOSEC) 40 MG capsule Take 1 capsule (40 mg total) by mouth daily. 10/08/14  Yes Aleksei [name] V, MD  predniSONE (DELTASONE) 5 MG tablet Take 5 mg by mouth every morning.    Yes Historical Provider, MD   BP 102/89 mmHg  Pulse 158  Temp(Src) 98.8 F (37.1 C) (Oral)  Resp 20  SpO2 99% Physical Exam  Constitutional: He is oriented to person, place, and time. He appears well-developed and well-nourished. No distress.  HENT:  Head: Normocephalic and atraumatic.  Right Ear: External ear normal.  Left Ear: External ear normal.  Nose: Nose normal.  Mouth/Throat: Oropharynx is clear and moist.  Eyes: Conjunctivae are normal.  Neck: Normal range of motion. Neck supple.  Cardiovascular: Intact distal pulses.  An irregularly irregular rhythm present. Tachycardia present.   Pulmonary/Chest: Effort normal and breath sounds normal. No respiratory distress.  Abdominal: Soft. There is no tenderness.  Musculoskeletal: Normal range of motion. He exhibits no edema.  Neurological: He is alert and oriented to person, place, and time.  Skin: Skin is warm and dry. He is not diaphoretic.  Psychiatric: He has a normal mood and affect.  Nursing note and vitals reviewed.   ED Course  Procedures (including critical care time) Medications   diltiazem (CARDIZEM) 1 mg/mL load via infusion 10 mg (10 mg Intravenous New Bag/Given 10/11/14 1443)    And  diltiazem (CARDIZEM) 100 mg in dextrose 5 % 100 mL (1 mg/mL) infusion (15 mg/hr Intravenous Restarted 10/11/14 1443)  heparin ADULT infusion 100 units/mL (25000 units/250 mL) (1,300 Units/hr Intravenous New Bag/Given 10/11/14 1432)  flecainide (TAMBOCOR) tablet 300 mg (not administered)  aspirin chewable tablet 324 mg (324 mg Oral Given 10/11/14 1317)  heparin bolus via infusion 4,000 Units (4,000 Units Intravenous New Bag/Given 10/11/14 1442)  sodium chloride 0.9 % bolus 250 mL (0 mLs Intravenous Stopped 10/11/14 1451)    Labs Review Labs Reviewed  CBC - Abnormal; Notable for the following:    HCT 35.9 (*)    MCHC 36.8 (*)  RDW 16.0 (*)    All other components within normal limits  BASIC METABOLIC PANEL - Abnormal; Notable for the following:    Potassium 5.7 (*)    Glucose, Bld 312 (*)    GFR calc non Af Amer 81 (*)    All other components within normal limits  BRAIN NATRIURETIC PEPTIDE  TROPONIN I  PROTIME-INR  MAGNESIUM  TSH  HEPARIN LEVEL (UNFRACTIONATED)  Randolm Idol, ED    Imaging Review Dg Chest Port 1 View  10/11/2014   CLINICAL DATA:  74 year old male with irregular heart rate and shortness of breath  EXAM: PORTABLE CHEST - 1 VIEW  COMPARISON:  Prior chest x-ray 09/10/2012  FINDINGS: Stable cardiac and mediastinal contours. Heart is within normal limits for size. Atherosclerotic calcification present in the transverse aorta. Increased pulmonary vascular congestion without overt edema. Extensive linear artifact projects over the chest in the form of numerous cardiac leads. No evidence of pneumothorax, pleural effusion or focal airspace consolidation. No acute osseous abnormality.  IMPRESSION: 1. Increased pulmonary vascular congestion without overt pulmonary edema. 2. The heart remains within normal limits for size. 3. Aortic atherosclerosis.   Electronically Signed    By: Jacqulynn Cadet M.D.   On: 10/11/2014 13:15     EKG Interpretation   Date/Time:  Thursday October 11 2014 12:30:28 EDT Ventricular Rate:  146 PR Interval:    QRS Duration: 96 QT Interval:  282 QTC Calculation: 439 R Axis:   53 Text Interpretation:  Atrial fibrillation with rapid V-rate Repolarization  abnormality, prob rate related agree Confirmed by Johnney Killian, MD, Jeannie Done  [phone]) on 10/11/2014 12:41:07 PM      CRITICAL CARE Performed by: Baron Sane L   Total critical care time: 35 minutes  Critical care time was exclusive of separately billable procedures and treating other patients.  Critical care was necessary to treat or prevent imminent or life-threatening deterioration.  Critical care was time spent personally by me on the following activities: development of treatment plan with patient and/or surrogate as well as nursing, discussions with consultants, evaluation of patient's response to treatment, examination of patient, obtaining history from patient or surrogate, ordering and performing treatments and interventions, ordering and review of laboratory studies, ordering and review of radiographic studies, pulse oximetry and re-evaluation of patient's condition.  MDM   Final diagnoses:  Atrial fibrillation and flutter    Filed Vitals:   10/11/14 1451  BP: 102/89  Pulse: 158  Temp:   Resp: 20    I have reviewed nursing notes, vital signs, and all appropriate lab and imaging results for this patient.  Patient with new onset A. Fib. Labs reviewed. CXR reviewed. Cardizem bolus and gtt started. Cardiology consulted and will admit the patient for further management and evaluation. Patient d/w with Dr. Johnney Killian, agrees with plan.    Baron Sane, PA-C 10/11/14 1457  Charlesetta Shanks, MD 10/13/14 1539

## 2014-10-11 NOTE — Progress Notes (Signed)
ANTICOAGULATION CONSULT NOTE - Initial Consult  Pharmacy Consult for heparin  Indication: atrial fibrillation  Allergies  Allergen Reactions  . Percocet [Oxycodone-Acetaminophen] Nausea And Vomiting    Patient Measurements:   Heparin Dosing Weight: 89 kg  Vital Signs: Temp: 98.8 F (37.1 C) (03/17 1232) Temp Source: Oral (03/17 1232) BP: 125/98 mmHg (03/17 1245) Pulse Rate: 96 (03/17 1300)  Labs:  Recent Labs  10/11/14 1230  HGB 13.2  HCT 35.9*  PLT 210    Estimated Creatinine Clearance: 85.9 mL/min (by C-G formula based on Cr of 0.98).   Medical History: Past Medical History  Diagnosis Date  . Diabetes mellitus   . Hypertension   . Hyperlipidemia   . Gout   . Arthritis   . Thyroid disease     hypothyroidism  . Personal history of colonic polyps 2007, 2008    adenoma each time, largest 12 mm in 2007  . Iron deficiency anemia   . AVM (arteriovenous malformation) of colon   . Allergy   . H/O transfusion of whole blood     Medications:  No anticoagulant  Admission:  74 yo M in ED with HR 158, given adenosine x 2 with rhythm going from SVT to A flutter.  IV diltiazem started.  Pharmacy consulted to dose heparin for afib.  Wt 109.8 kg; CBC WNL.  No anticoagulant PTA.    Goal of Therapy:  Heparin level 0.3-0.7 units/ml Monitor platelets by anticoagulation protocol: Yes   Plan:  Give 4000 units bolus x 1 Start heparin infusion at 1300 units/hr Check anti-Xa level in 8 hours and daily while on heparin Continue to monitor H&H and platelets  Eudelia Bunch, Pharm.D. BP:[phone] 10/11/2014 1:33 PM

## 2014-10-11 NOTE — ED Notes (Signed)
[place], MD at bedside

## 2014-10-11 NOTE — ED Notes (Signed)
Cardiology paged d/t pts HR 160, Eulis Foster, MD notified

## 2014-10-11 NOTE — ED Notes (Signed)
Wife's cell phone number if needed:  [phone], Michael [name]

## 2014-10-11 NOTE — ED Notes (Signed)
Pt undressed, in gown, on monitor, continuous pulse oximetry, blood pressure cuff and oxygen [place] (3L); visitor at bedside

## 2014-10-12 ENCOUNTER — Encounter (HOSPITAL_COMMUNITY): Payer: Self-pay | Admitting: Anesthesiology

## 2014-10-12 ENCOUNTER — Encounter (HOSPITAL_COMMUNITY)
Admission: EM | Disposition: A | Discharge status: Home / Self Care | Payer: Self-pay | Source: Home / Self Care | Attending: Cardiology

## 2014-10-12 DIAGNOSIS — E781 Pure hyperglyceridemia: Secondary | ICD-10-CM

## 2014-10-12 DIAGNOSIS — I248 Other forms of acute ischemic heart disease: Secondary | ICD-10-CM

## 2014-10-12 DIAGNOSIS — I4891 Unspecified atrial fibrillation: Secondary | ICD-10-CM

## 2014-10-12 LAB — CBC
HEMATOCRIT: 29.6 % — AB (ref 39.0–52.0)
Hemoglobin: 10.1 g/dL — ABNORMAL LOW (ref 13.0–17.0)
MCH: 28.8 pg (ref 26.0–34.0)
MCHC: 34.1 g/dL (ref 30.0–36.0)
MCV: 84.3 fL (ref 78.0–100.0)
Platelets: 151 10*3/uL (ref 150–400)
RBC: 3.51 MIL/uL — ABNORMAL LOW (ref 4.22–5.81)
RDW: 16.3 % — AB (ref 11.5–15.5)
WBC: 5.2 10*3/uL (ref 4.0–10.5)

## 2014-10-12 LAB — TROPONIN I
Troponin I: 0.35 ng/mL — ABNORMAL HIGH (ref <0.031)
Troponin I: 0.33 ng/mL — ABNORMAL HIGH (ref <0.031)
Troponin I: 0.24 ng/mL — ABNORMAL HIGH (ref <0.031)

## 2014-10-12 LAB — GLUCOSE, CAPILLARY
GLUCOSE-CAPILLARY: 215 mg/dL — AB (ref 70–99)
GLUCOSE-CAPILLARY: 224 mg/dL — AB (ref 70–99)

## 2014-10-12 LAB — LIPID PANEL
CHOL/HDL RATIO: 7.5 ratio
CHOLESTEROL: 165 mg/dL (ref 0–200)
HDL: 22 mg/dL — AB (ref >39)
LDL Cholesterol: UNDETERMINED mg/dL (ref 0–99)
TRIGLYCERIDES: 792 mg/dL — AB (ref <150)
VLDL: UNDETERMINED mg/dL (ref 0–40)

## 2014-10-12 LAB — BASIC METABOLIC PANEL
Anion gap: 9 (ref 5–15)
BUN: 26 mg/dL — ABNORMAL HIGH (ref 6–23)
CO2: 24 mmol/L (ref 19–32)
Calcium: 8.2 mg/dL — ABNORMAL LOW (ref 8.4–10.5)
Chloride: 106 mmol/L (ref 96–112)
Creatinine, Ser: 1.1 mg/dL (ref 0.50–1.35)
GFR calc Af Amer: 75 mL/min — ABNORMAL LOW (ref >90)
GFR, EST NON AFRICAN AMERICAN: 65 mL/min — AB (ref >90)
GLUCOSE: 199 mg/dL — AB (ref 70–99)
POTASSIUM: 3.7 mmol/L (ref 3.5–5.1)
SODIUM: 139 mmol/L (ref 135–145)

## 2014-10-12 LAB — HEPARIN LEVEL (UNFRACTIONATED)
HEPARIN UNFRACTIONATED: 0.12 [IU]/mL — AB (ref 0.30–0.70)
HEPARIN UNFRACTIONATED: 0.13 [IU]/mL — AB (ref 0.30–0.70)
Heparin Unfractionated: <0.1 IU/mL — ABNORMAL LOW (ref 0.30–0.70)

## 2014-10-12 SURGERY — CARDIOVERSION
Anesthesia: Monitor Anesthesia Care

## 2014-10-12 MED ORDER — HEPARIN BOLUS VIA INFUSION
2000.0000 [IU] | Freq: Once | INTRAVENOUS | Status: AC
  Administered 2014-10-12: 2000 [IU] via INTRAVENOUS
  Filled 2014-10-12: qty 2000

## 2014-10-12 MED ORDER — LIDOCAINE HCL (CARDIAC) 20 MG/ML IV SOLN
INTRAVENOUS | Status: AC
  Filled 2014-10-12: qty 10

## 2014-10-12 MED ORDER — OFF THE BEAT BOOK
Freq: Once | Status: AC
  Administered 2014-10-12: 16:00:00
  Filled 2014-10-12: qty 1

## 2014-10-12 MED ORDER — PERFLUTREN LIPID MICROSPHERE
1.0000 mL | INTRAVENOUS | Status: AC | PRN
  Administered 2014-10-12: 2 mL via INTRAVENOUS
  Filled 2014-10-12: qty 10

## 2014-10-12 MED ORDER — ASPIRIN 81 MG PO CHEW
81.0000 mg | CHEWABLE_TABLET | Freq: Every day | ORAL | Status: DC
  Administered 2014-10-12 – 2014-10-13 (×2): 81 mg via ORAL
  Filled 2014-10-12 (×2): qty 1

## 2014-10-12 MED ORDER — METOPROLOL TARTRATE 25 MG PO TABS
25.0000 mg | ORAL_TABLET | Freq: Two times a day (BID) | ORAL | Status: DC
  Administered 2014-10-12 – 2014-10-13 (×2): 25 mg via ORAL
  Filled 2014-10-12 (×4): qty 1

## 2014-10-12 MED ORDER — PERFLUTREN LIPID MICROSPHERE
INTRAVENOUS | Status: AC
  Administered 2014-10-12: 2 mL via INTRAVENOUS
  Filled 2014-10-12: qty 10

## 2014-10-12 NOTE — Progress Notes (Signed)
Patient converted on flecainide upon repeat examination. BP soft. Plan to add rate control medication once BP improve.  Hilbert Corrigan PA Pager: [phone]

## 2014-10-12 NOTE — Progress Notes (Signed)
ANTICOAGULATION CONSULT NOTE - Follow Up Consult  Pharmacy Consult for Heparin  Indication: atrial fibrillation  Allergies  Allergen Reactions  . Percocet [Oxycodone-Acetaminophen] Nausea And Vomiting    Patient Measurements: Height: 5\' 11"  (180.3 cm) Weight: 242 lb 15.2 oz (110.2 kg) IBW/kg (Calculated) : 75.3  Heparin weight= 99kg   Vital Signs: Temp: 98 F (36.7 C) (03/18 2008) Temp Source: Oral (03/18 2008) BP: 144/56 mmHg (03/18 1900) Pulse Rate: 78 (03/18 1622)  Labs:  Recent Labs  10/11/14 1230  10/11/14 2300 10/12/14 0240 10/12/14 0248 10/12/14 0832 10/12/14 0900 10/12/14 1950  HGB 13.2  --   --   --  10.1*  --   --   --   HCT 35.9*  --   --   --  29.6*  --   --   --   PLT 210  --   --   --  151  --   --   --   LABPROT 12.8  --   --   --   --   --   --   --   INR 0.95  --   --   --   --   --   --   --   HEPARINUNFRC  --   < > <0.10* 0.12*  --   --  <0.10* 0.13*  CREATININE 0.95  --   --   --  1.10  --   --   --   TROPONINI <0.03  --  0.35*  --  0.33* 0.24*  --   --   < > = values in this interval not displayed.  Estimated Creatinine Clearance: 76.7 mL/min (by C-G formula based on Cr of 1.1).   Assessment: 74 yo male with new onset afib on heparin and heparin level is <goal, but trending up.  No infusion interuptions or occlusions noted.   Goal of Therapy:  Heparin level 0.3-0.7 units/ml Monitor platelets by anticoagulation protocol: Yes   Plan:  Increase heparin infusion to 2100 units/hr Heparin level in 6 hours Daily heparin level and CBC  Kris Burd, Pharm.D., BCPS Clinical Pharmacist Pager [phone] 10/12/2014 9:00 PM

## 2014-10-12 NOTE — Progress Notes (Signed)
ANTICOAGULATION CONSULT NOTE - Follow Up Consult  Pharmacy Consult for Heparin  Indication: atrial fibrillation  Allergies  Allergen Reactions  . Percocet [Oxycodone-Acetaminophen] Nausea And Vomiting    Patient Measurements: Height: 5\' 11"  (180.3 cm) Weight: 242 lb 15.2 oz (110.2 kg) IBW/kg (Calculated) : 75.3 Vital Signs: Temp: 98.2 F (36.8 C) (03/17 2300) Temp Source: Oral (03/17 2300) BP: 105/35 mmHg (03/17 2300) Pulse Rate: 73 (03/17 2300)  Labs:  Recent Labs  10/11/14 1230 10/11/14 2300  HGB 13.2  --   HCT 35.9*  --   PLT 210  --   LABPROT 12.8  --   INR 0.95  --   HEPARINUNFRC  --  <0.10*  CREATININE 0.95  --   TROPONINI <0.03  --     Estimated Creatinine Clearance: 88.8 mL/min (by C-G formula based on Cr of 0.95).   Assessment: Undetectable heparin level, apparently was off for unknown amt of time in ED (will be less aggressive with bolus/increase), other labs as above.   Goal of Therapy:  Heparin level 0.3-0.7 units/ml Monitor platelets by anticoagulation protocol: Yes   Plan:  -Heparin 2000 units BOLUS -Increase heparin drip to 1500 units/hr -0900 HL -Daily CBC/HL -Monitor for bleeding  Narda Bonds 10/12/2014,12:06 AM

## 2014-10-12 NOTE — Discharge Instructions (Signed)

## 2014-10-12 NOTE — Progress Notes (Signed)
   10/12/14 1300  Clinical Encounter Type  Visited With Patient and family together  Visit Type Initial;Spiritual support;Social support   Chaplain visited with patient to notify him of an event that happened to his brother while his brother was in the hospital and on his way to visit the patient. Patient's brother and sister-in-law were on there way to visit the patient when the patient's brother unexpectedly fell out in the hallway on the second floor. A response team provided care for the patient's brother and the patient's brother is now receiving care in the ED. Patient's brother was concerned but also explained that his brother has had a lot of health issues lately. Patient's wife was also present while chaplain was giving this news and she said she was would go down and be with the patient's brother later today. Chaplain notified the patient of this news per request of the patient's sister-in-law. Chaplain will continue to provide emotional and spiritual support for patient and patient's family as needed. Jareli Highland, Claudius Sis, Chaplain  1:13 PM

## 2014-10-12 NOTE — Progress Notes (Signed)
Echocardiogram 2D Echocardiogram with Definity has been performed.  Michael [name] 10/12/2014, 1:53 PM

## 2014-10-12 NOTE — Progress Notes (Addendum)
Patient Name: Michael [name] Date of Encounter: 10/12/2014  Principal Problem:   Atrial fibrillation with RVR Active Problems:   Obesity   Hypertriglyceridemia   HTN (hypertension)   Poorly controlled diabetes mellitus   Atrial fibrillation   Length of Stay: 1  SUBJECTIVE  The patient feels well, denies chest pain or SOB.   CURRENT MEDS . allopurinol  200 mg Oral q morning - 10a  . cholecalciferol  1,000 Units Oral Daily  . clobetasol  1 application Topical BID  . ferrous sulfate  325 mg Oral TID WC  . gemfibrozil  600 mg Oral BID AC  . insulin aspart  0-20 Units Subcutaneous TID WC  . levothyroxine  150 mcg Oral QAC breakfast  . pantoprazole  40 mg Oral Daily  . predniSONE  5 mg Oral q morning - 10a  . [START ON 10/13/2014] Vitamin D (Ergocalciferol)  50,000 Units Oral Weekly    OBJECTIVE  Filed Vitals:   10/12/14 0747 10/12/14 0800 10/12/14 0900 10/12/14 1000  BP: 140/57 125/50 166/57 128/42  Pulse: 69 70 76 72  Temp: 97.6 F (36.4 C)     TempSrc: Oral     Resp: 13 11 12 15   Height:      Weight:      SpO2: 98% 97% 98% 98%    Intake/Output Summary (Last 24 hours) at 10/12/14 1043 Last data filed at 10/12/14 1000  Gross per 24 hour  Intake   1413 ml  Output    400 ml  Net   1013 ml   Filed Weights   10/11/14 2000  Weight: 242 lb 15.2 oz (110.2 kg)    PHYSICAL EXAM  General: Pleasant, NAD. Neuro: Alert and oriented X 3. Moves all extremities spontaneously. Psych: Normal affect. HEENT:  Normal  Neck: Supple without bruits or JVD. Lungs:  Resp regular and unlabored, CTA. Heart: RRR no s3, s4, or murmurs. Abdomen: Soft, non-tender, non-distended, BS + x 4.  Extremities: No clubbing, cyanosis or edema. DP/PT/Radials 2+ and equal bilaterally.  Accessory Clinical Findings  CBC  Recent Labs  10/11/14 1230 10/12/14 0248  WBC 6.4 5.2  HGB 13.2 10.1*  HCT 35.9* 29.6*  MCV 83.7 84.3  PLT 210 [id number]   Basic Metabolic Panel  Recent Labs  10/11/14 1230 10/12/14 0248  NA 136 139  K 5.7* 3.7  CL 103 106  CO2 19 24  GLUCOSE 312* 199*  BUN 22 26*  CREATININE 0.95 1.10  CALCIUM 9.0 8.2*  MG 2.0  --    Cardiac Enzymes  Recent Labs  10/11/14 2300 10/12/14 0248 10/12/14 0832  TROPONINI 0.35* 0.33* 0.24*    Recent Labs  10/12/14 0248  CHOL 165  HDL 22*  LDLCALC UNABLE TO CALCULATE IF TRIGLYCERIDE OVER 400 mg/dL  TRIG 792*  CHOLHDL 7.5   Thyroid Function Tests  Recent Labs  10/11/14 1250  TSH 1.441   Radiology/Studies  Dg Chest Port 1 View  10/11/2014   CLINICAL DATA:  74 year old male with irregular heart rate and shortness of breath    IMPRESSION: 1. Increased pulmonary vascular congestion without overt pulmonary edema. 2. The heart remains within normal limits for size. 3. Aortic atherosclerosis.     TELE: SR  ECG: SR, normal PR and QRS interval, non-specific ST-T wave changes, QT/QTc 420/459 ms     ASSESSMENT AND PLAN  74 year old male with past medical history of HTN, HLD, DM, history of hypothyroidism, history of iron deficiency anemia as result of  proximal small bowel AVM present with new onset afib with RVR  1. New onset a-fib with RVR - the rate couldn't be controlled with iv Cardizem and the patient was given Flecainide 300 mg PO x 1. He spontaneously cardioverted to SR the last night and remains in SR.  2. His troponin is elevated - based on low elevation and flat results I assume this is demand ischemia sec to a-fib with RVR, however he has never had ischemic workup.  I would suggest that we schedule him for a nuclear stress test for tomorrow. Echocardiogram is also still pending. IF abnormal LVEF or regional wall motion abnormalities, he will be scheduled for a cath.   I will start metoprolol 25 mg po BID and aspirin 81 mg po daily.  3. HTN - start metoprolol 25 mg po bid  4. Poorly controlled diabetes - check HbA1c  Signed, Dorothy Spark MD, [facility] 10/12/2014

## 2014-10-12 NOTE — Care Management Note (Signed)
    Page 1 of 1   10/12/2014     8:56:39 AM CARE MANAGEMENT NOTE 10/12/2014  Patient:  JARE, BIGOS   Account Number:  [id number]  Date Initiated:  10/12/2014  Documentation initiated by:  Elissa Hefty  Subjective/Objective Assessment:   adm w at fib     Action/Plan:   lives w wife, pcp dr Alain Marion   Anticipated DC Date:     Anticipated DC Plan:  HOME/SELF CARE         Choice offered to / List presented to:             Status of service:   Medicare Important Message given?   (If response is "NO", the following Medicare IM given date fields will be blank) Date Medicare IM given:   Medicare IM given by:   Date Additional Medicare IM given:   Additional Medicare IM given by:    Discharge Disposition:    Per UR Regulation:  Reviewed for med. necessity/level of care/duration of stay  If discussed at [place] of Stay Meetings, dates discussed:    Comments:

## 2014-10-12 NOTE — Progress Notes (Signed)
ANTICOAGULATION CONSULT NOTE - Follow Up Consult  Pharmacy Consult for Heparin  Indication: atrial fibrillation  Allergies  Allergen Reactions  . Percocet [Oxycodone-Acetaminophen] Nausea And Vomiting    Patient Measurements: Height: 5\' 11"  (180.3 cm) Weight: 242 lb 15.2 oz (110.2 kg) IBW/kg (Calculated) : 75.3  Heparin weight= 99kg   Vital Signs: Temp: 97.6 F (36.4 C) (03/18 0747) Temp Source: Oral (03/18 0747) BP: 128/42 mmHg (03/18 1000) Pulse Rate: 72 (03/18 1000)  Labs:  Recent Labs  10/11/14 1230 10/11/14 2300 10/12/14 0240 10/12/14 0248 10/12/14 0832 10/12/14 0900  HGB 13.2  --   --  10.1*  --   --   HCT 35.9*  --   --  29.6*  --   --   PLT 210  --   --  151  --   --   LABPROT 12.8  --   --   --   --   --   INR 0.95  --   --   --   --   --   HEPARINUNFRC  --  <0.10* 0.12*  --   --  <0.10*  CREATININE 0.95  --   --  1.10  --   --   TROPONINI <0.03 0.35*  --  0.33* 0.24*  --     Estimated Creatinine Clearance: 76.7 mL/min (by C-G formula based on Cr of 1.1).   Assessment: 74 yo male with new onset afib on heparin and HL is undetectable. No infusion interuptions or occlusions noted.   Goal of Therapy:  Heparin level 0.3-0.7 units/ml Monitor platelets by anticoagulation protocol: Yes   Plan:  -Heparin bolus 2000 units and increase infusion to 1800 units/hr -Heparin level in 8 hours and daily with CBC daily   Hildred Laser, Pharm D 10/12/2014 11:32 AM

## 2014-10-12 NOTE — Progress Notes (Signed)
Inpatient Diabetes Program Recommendations  AACE/ADA: New Consensus Statement on Inpatient Glycemic Control (2013)  Target Ranges:  Prepandial:   less than 140 mg/dL      Peak postprandial:   less than 180 mg/dL (1-2 hours)      Critically ill patients:  140 - 180 mg/dL    Results for Michael [name], Michael [name] (MRN [id number]) as of 10/12/2014 12:53  Ref. Range 10/11/2014 21:08 10/12/2014 07:35 10/12/2014 11:51  Glucose-Capillary Latest Range: 70-99 mg/dL 244 (H) 215 (H) 224 (H)   Diabetes history: DM 2 Outpatient Diabetes medications:  Glipizide 10 mg Daily Current orders for Inpatient glycemic control: Novolog 0-20 units TID  Inpatient Diabetes Program Recommendations Insulin - Basal: Patient consistently in the 200's. Normally on Glipizide at home. Please consider Lantus 10 units Q24 hrs.   Thanks,  Tama Headings RN, MSN, [facility] Inpatient Diabetes Coordinator Team Pager [phone]

## 2014-10-13 ENCOUNTER — Inpatient Hospital Stay (HOSPITAL_COMMUNITY): Payer: Medicare Other

## 2014-10-13 ENCOUNTER — Telehealth: Payer: Self-pay | Admitting: Cardiology

## 2014-10-13 DIAGNOSIS — R778 Other specified abnormalities of plasma proteins: Secondary | ICD-10-CM | POA: Insufficient documentation

## 2014-10-13 DIAGNOSIS — R7989 Other specified abnormal findings of blood chemistry: Secondary | ICD-10-CM

## 2014-10-13 DIAGNOSIS — R079 Chest pain, unspecified: Secondary | ICD-10-CM

## 2014-10-13 DIAGNOSIS — E669 Obesity, unspecified: Secondary | ICD-10-CM

## 2014-10-13 LAB — GLUCOSE, CAPILLARY
GLUCOSE-CAPILLARY: 252 mg/dL — AB (ref 70–99)
GLUCOSE-CAPILLARY: 192 mg/dL — AB (ref 70–99)
GLUCOSE-CAPILLARY: 267 mg/dL — AB (ref 70–99)
Glucose-Capillary: 251 mg/dL — ABNORMAL HIGH (ref 70–99)

## 2014-10-13 LAB — CBC
HCT: 30.6 % — ABNORMAL LOW (ref 39.0–52.0)
Hemoglobin: 10.2 g/dL — ABNORMAL LOW (ref 13.0–17.0)
MCH: 28.3 pg (ref 26.0–34.0)
MCHC: 33.3 g/dL (ref 30.0–36.0)
MCV: 84.8 fL (ref 78.0–100.0)
Platelets: 127 10*3/uL — ABNORMAL LOW (ref 150–400)
RBC: 3.61 MIL/uL — ABNORMAL LOW (ref 4.22–5.81)
RDW: 16 % — AB (ref 11.5–15.5)
WBC: 4.4 10*3/uL (ref 4.0–10.5)

## 2014-10-13 LAB — HEPARIN LEVEL (UNFRACTIONATED)
HEPARIN UNFRACTIONATED: 0.25 [IU]/mL — AB (ref 0.30–0.70)
HEPARIN UNFRACTIONATED: 0.3 [IU]/mL (ref 0.30–0.70)

## 2014-10-13 MED ORDER — REGADENOSON 0.4 MG/5ML IV SOLN
0.4000 mg | Freq: Once | INTRAVENOUS | Status: AC
  Administered 2014-10-13: 0.4 mg via INTRAVENOUS
  Filled 2014-10-13: qty 5

## 2014-10-13 MED ORDER — TECHNETIUM TC 99M SESTAMIBI - CARDIOLITE
30.0000 | Freq: Once | INTRAVENOUS | Status: AC | PRN
  Administered 2014-10-13: 11:00:00 30 via INTRAVENOUS

## 2014-10-13 MED ORDER — APIXABAN 5 MG PO TABS: 5.0000 mg | ORAL_TABLET | Freq: Two times a day (BID) | ORAL | Status: DC

## 2014-10-13 MED ORDER — DILTIAZEM HCL ER COATED BEADS 120 MG PO CP24
120.0000 mg | ORAL_CAPSULE | Freq: Every day | ORAL | Status: DC
  Administered 2014-10-13: 120 mg via ORAL
  Filled 2014-10-13: qty 1

## 2014-10-13 MED ORDER — TECHNETIUM TC 99M SESTAMIBI GENERIC - CARDIOLITE
10.0000 | Freq: Once | INTRAVENOUS | Status: AC | PRN
  Administered 2014-10-13: 10 via INTRAVENOUS

## 2014-10-13 MED ORDER — APIXABAN 5 MG PO TABS
5.0000 mg | ORAL_TABLET | Freq: Two times a day (BID) | ORAL | Status: DC
  Administered 2014-10-13: 5 mg via ORAL
  Filled 2014-10-13 (×2): qty 1

## 2014-10-13 MED ORDER — DILTIAZEM HCL ER COATED BEADS 120 MG PO CP24: 120.0000 mg | ORAL_CAPSULE | Freq: Every day | ORAL | Status: DC

## 2014-10-13 MED ORDER — ACETAMINOPHEN 325 MG PO TABS
ORAL_TABLET | ORAL | Status: AC
  Filled 2014-10-13: qty 2

## 2014-10-13 MED ORDER — REGADENOSON 0.4 MG/5ML IV SOLN
INTRAVENOUS | Status: AC
  Administered 2014-10-13: 0.4 mg via INTRAVENOUS
  Filled 2014-10-13: qty 5

## 2014-10-13 MED ORDER — METOPROLOL TARTRATE 25 MG PO TABS: 25.0000 mg | ORAL_TABLET | Freq: Two times a day (BID) | ORAL | Status: DC

## 2014-10-13 MED ORDER — DILTIAZEM HCL 60 MG PO TABS: 120.0000 mg | ORAL_TABLET | Freq: Every day | ORAL | Status: DC

## 2014-10-13 NOTE — Progress Notes (Addendum)
ANTICOAGULATION CONSULT NOTE - Follow Up Consult  Pharmacy Consult for Heparin  Indication: atrial fibrillation  Allergies  Allergen Reactions  . Percocet [Oxycodone-Acetaminophen] Nausea And Vomiting    Patient Measurements: Height: 5\' 11"  (180.3 cm) Weight: 242 lb 15.2 oz (110.2 kg) IBW/kg (Calculated) : 75.3 Vital Signs: Temp: 97.5 F (36.4 C) (03/18 2324) Temp Source: Oral (03/18 2324) BP: 178/81 mmHg (03/18 2325) Pulse Rate: 73 (03/18 2325)  Labs:  Recent Labs  10/11/14 1230 10/11/14 2300  10/12/14 0248 10/12/14 0832 10/12/14 0900 10/12/14 1950 10/13/14 0300  HGB 13.2  --   --  10.1*  --   --   --  10.2*  HCT 35.9*  --   --  29.6*  --   --   --  30.6*  PLT 210  --   --  151  --   --   --  127*  LABPROT 12.8  --   --   --   --   --   --   --   INR 0.95  --   --   --   --   --   --   --   HEPARINUNFRC  --  <0.10*  < >  --   --  <0.10* 0.13* 0.25*  CREATININE 0.95  --   --  1.10  --   --   --   --   TROPONINI <0.03 0.35*  --  0.33* 0.24*  --   --   --   < > = values in this interval not displayed.  Estimated Creatinine Clearance: 76.7 mL/min (by C-G formula based on Cr of 1.1).   Assessment: Sub-therapeutic heparin level despite rate increase, no issues per RN.   Goal of Therapy:  Heparin level 0.3-0.7 units/ml Monitor platelets by anticoagulation protocol: Yes   Plan:  -Increase heparin drip to 2300 units/hr -1100 HL -Daily CBC/HL -Monitor for bleeding  Narda Bonds 10/13/2014,4:09 AM  1100 HL came back therapeutic at 0.3.  Plan: Continue heparin gtt at 2300 units/hr Check 8 hr confirmatory HL at 1800 Monitor daily HL, CBC, s/s of bleed  Elenor Quinones, PharmD Clinical Pharmacist Resident Pager [phone] 10/13/2014 11:54 AM

## 2014-10-13 NOTE — Telephone Encounter (Signed)
Paged by patient's wife. They were unable to pick up metoprolol, diltiazem, and apixaban because [facility] was [facility]. I called the prescriptions in to the [facility] on [address]. They will pick up tonight.

## 2014-10-13 NOTE — Progress Notes (Signed)
  I spoke with Dr. Meda Coffee, who reviewed stress test findings. He is ok for discharge. Will change to PO Cardizem and transition to NOAC, 5 mg Eliquis BID. Will arrange close OP f/u.   Kiwan Gadsden 10/13/2014

## 2014-10-13 NOTE — Discharge Summary (Signed)
Physician Discharge Summary  Patient ID: Michael [name] MRN: [id number] DOB/AGE: 03/10/1941 74 y.o.   Primary Cardiologist: Dr. Mare Ferrari  Admit date: 10/11/2014 Discharge date: 10/13/2014  Admission Diagnoses: Atrial Fibrillation w/ RVR  Discharge Diagnoses:  Principal Problem:   Atrial fibrillation with RVR Active Problems:   Obesity   Hypertriglyceridemia   HTN (hypertension)   Poorly controlled diabetes mellitus   Atrial fibrillation   Discharged Condition: stable  Hospital Course: 74 year old male with past medical history of HTN, HLD, DM, history of hypothyroidism, history of iron deficiency anemia as result of proximal small bowel AVM who presented to ED 10/11/14 with  new onset afib with RVR. TRH was normal. He was placed on IV Cardizem for rate control and IV heparin. 2D echo showed normal LVF, with EF of 55-60%. However, cardiac enzymes were cycled and were mildly elevated x 3 at 0.35-->0.33-->0.24. Subsequently, he underwent a NST that demonstrated no reversible ischemia or infarction. The results were reviewed by Dr. Meda Coffee who agreed. He ultimately spontaneously converted to NSR on IV Cardizem and was transitioned to PO, 120 mg. He was also placed on metoprolol BID. He was also transitioned to Eliquis given elevated CHA2DS2 VASc score of 3 (HTN, DM, Age 20-74). He was last seen and examined by Dr. Meda Coffee who determined he was stable for discharge home. He will f/u in 1-2 weeks. F/u CBC recommended given prior history of GIB and start of Eliquis. PPI therapy was continued.    Consults: None  Significant Diagnostic Studies:      Cardiac Panel (last 3 results)  Recent Labs  10/11/14 2300 10/12/14 0248 10/12/14 0832  TROPONINI 0.35* 0.33* 0.24*    2D Echo 10/12/14 Study Conclusions  - Left ventricle: The cavity size was normal. Wall thickness was increased in a pattern of mild LVH. Systolic function was normal. The estimated ejection fraction was in the range of  55% to 60%. - Aortic valve: Small systolic gradient without signficant stenosis. Valve area (VTI): 1.49 cm^2. Valve area (Vmax): 1.22 cm^2. Valve area (Vmean): 1.27 cm^2. - Left atrium: The atrium was mildly dilated. - Atrial septum: No defect or patent foramen ovale was identified.   NST 10/12/14 FINDINGS: Perfusion: No decreased activity in the left ventricle on stress imaging to suggest reversible ischemia or infarction.  Wall Motion: There is hypokinesia of the septum. Overall mild global hypokinesia.  Left Ventricular Ejection Fraction: 49 %  End diastolic volume 128 ml  End systolic volume 75 ml  IMPRESSION: 1. No reversible ischemia or infarction.  2. Septal hypokinesia.  3. Left ventricular ejection fraction 49%  4. Intermediate-risk stress test findings*. (Based on EF)    Treatments: See Hospital Course  Discharge Exam: Blood pressure 147/72, pulse 64, temperature 97.5 F (36.4 C), temperature source Oral, resp. rate 18, height 5' 11"  (1.803 m), weight 242 lb 15.2 oz (110.2 kg), SpO2 97 %.   Disposition: 01-Home or Self Care      Discharge Instructions    Diet - low sodium heart healthy    Complete by:  As directed      Increase activity slowly    Complete by:  As directed             Medication List    STOP taking these medications        carvedilol 12.5 MG tablet  Commonly known as:  COREG      TAKE these medications        acetaminophen 650 MG CR tablet  Commonly known as:  TYLENOL  Take 650 mg by mouth every 8 (eight) hours as needed. Take two tablets by mouth as needed     allopurinol 100 MG tablet  Commonly known as:  ZYLOPRIM  Take 200 mg by mouth every morning.     apixaban 5 MG Tabs tablet  Commonly known as:  ELIQUIS  Take 1 tablet (5 mg total) by mouth 2 (two) times daily.     Cholecalciferol 1000 UNITS Tbdp  Take 1,000 Units by mouth daily.     clobetasol 0.05 % external solution  Commonly known as:   TEMOVATE  Apply 1 application topically 2 (two) times daily. Apply to scalp     diltiazem 120 MG 24 hr capsule  Commonly known as:  CARDIZEM CD  Take 1 capsule (120 mg total) by mouth daily.     ergocalciferol 50000 UNITS capsule  Commonly known as:  VITAMIN D2  Take 1 capsule (50,000 Units total) by mouth once a week.     ferrous sulfate 325 (65 FE) MG tablet  Take 325 mg by mouth 3 (three) times daily with meals.     gemfibrozil 600 MG tablet  Commonly known as:  LOPID  Take 600 mg by mouth 2 (two) times daily before a meal.     glipiZIDE 10 MG 24 hr tablet  Commonly known as:  GLUCOTROL XL  Take 1 tablet (10 mg total) by mouth every morning.     glucose blood test strip  Commonly known as:  ONE TOUCH ULTRA TEST  Use as instructed     levothyroxine 150 MCG tablet  Commonly known as:  SYNTHROID, LEVOTHROID  Take 1 tablet (150 mcg total) by mouth daily before breakfast.     lisinopril 20 MG tablet  Commonly known as:  PRINIVIL,ZESTRIL  Take 1 tablet (20 mg total) by mouth every morning.     metoprolol tartrate 25 MG tablet  Commonly known as:  LOPRESSOR  Take 1 tablet (25 mg total) by mouth 2 (two) times daily.     omeprazole 40 MG capsule  Commonly known as:  PRILOSEC  Take 1 capsule (40 mg total) by mouth daily.     ONE TOUCH ULTRA 2 W/DEVICE Kit  - Use as directed  - Dx E11.9     onetouch ultrasoft lancets  Use to check blood sugars daily     predniSONE 5 MG tablet  Commonly known as:  DELTASONE  Take 5 mg by mouth every morning.        TIME SPENT ON DISCHARGE, INCLUDING PHYSICIAN TIME: >30 MINUTES  Signed: Ruby Dilone 10/13/2014, 2:06 PM

## 2014-10-13 NOTE — Progress Notes (Signed)
Patient Profile; 74 year old male with past medical history of HTN, HLD, DM, history of hypothyroidism, history of iron deficiency anemia as result of proximal small bowel AVM presented with new onset afib with RVR.  Subjective: Currently asymptomatic. Denies CP.   Objective: Vital signs in last 24 hours: Temp:  [97.5 F (36.4 C)-98 F (36.7 C)] 97.6 F (36.4 C) (03/19 0716) Pulse Rate:  [64-78] 64 (03/19 0410) Resp:  [11-26] 18 (03/19 0716) BP: (92-178)/(40-112) 162/60 mmHg (03/19 0716) SpO2:  [96 %-99 %] 96 % (03/19 0716) Last BM Date: 10/12/14  Intake/Output from previous day: 03/18 0701 - 03/19 0700 In: 1597 [P.O.:1000; I.V.:597] Out: -  Intake/Output this shift:   Medications . acetaminophen      . allopurinol  200 mg Oral q morning - 10a  . aspirin  81 mg Oral Daily  . cholecalciferol  1,000 Units Oral Daily  . ferrous sulfate  325 mg Oral TID WC  . gemfibrozil  600 mg Oral BID AC  . insulin aspart  0-20 Units Subcutaneous TID WC  . levothyroxine  150 mcg Oral QAC breakfast  . metoprolol tartrate  25 mg Oral BID  . pantoprazole  40 mg Oral Daily  . predniSONE  5 mg Oral q morning - 10a  . Vitamin D (Ergocalciferol)  50,000 Units Oral Weekly   PE: General appearance: alert, cooperative and no distress Neck: no carotid bruit and no JVD Lungs: clear to auscultation bilaterally Heart: regular rate and rhythm, S1, S2 normal, no murmur, click, rub or gallop Extremities: no LEE Pulses: 2+ and symmetric Skin: wrm and dry Neurologic: Grossly normal  Lab Results:   Recent Labs  10/11/14 1230 10/12/14 0248 10/13/14 0300  WBC 6.4 5.2 4.4  HGB 13.2 10.1* 10.2*  HCT 35.9* 29.6* 30.6*  PLT 210 151 127*   BMET  Recent Labs  10/11/14 1230 10/12/14 0248  NA 136 139  K 5.7* 3.7  CL 103 106  CO2 19 24  GLUCOSE 312* 199*  BUN 22 26*  CREATININE 0.95 1.10  CALCIUM 9.0 8.2*   PT/INR  Recent Labs  10/11/14 1230  LABPROT 12.8  INR 0.95    Cholesterol  Recent Labs  10/12/14 0248  CHOL 165   Cardiac Panel (last 3 results)  Recent Labs  10/11/14 2300 10/12/14 0248 10/12/14 0832  TROPONINI 0.35* 0.33* 0.24*    Studies/Results: 2D Echo 10/12/14 Study Conclusions  - Left ventricle: The cavity size was normal. Wall thickness was increased in a pattern of mild LVH. Systolic function was normal. The estimated ejection fraction was in the range of 55% to 60%. - Aortic valve: Small systolic gradient without signficant stenosis. Valve area (VTI): 1.49 cm^2. Valve area (Vmax): 1.22 cm^2. Valve area (Vmean): 1.27 cm^2. - Left atrium: The atrium was mildly dilated. - Atrial septum: No defect or patent foramen ovale was identified.   NST 10/13/14 - pending   Assessment/Plan  Principal Problem:   Atrial fibrillation with RVR Active Problems:   Obesity   Hypertriglyceridemia   HTN (hypertension)   Poorly controlled diabetes mellitus   Atrial fibrillation  1. Atrial Fibrillation: spontaneous conversion to NSR on IV Cardizem. Symptoms resolved. TSH normal. Normal LVF on 2D echo with mildly dialated left atrium. CHA2DS2 VASc score is 3 (HTN, DM, Age 64-74). Will need transition to oral anticoagulation if NST is negative. Keep on IV heparin for now in case NST is abnormal and LHC is needed. Can convert to PO Cardizem today with 2hr  IV Cardizem overlap. Continue metoprolol.   2. Elevated troponins: ? True type I NSTEMI vs type II secondary to demand ischemia in the setting of rapid atrial fibrillation. NST completed. Results pending. If abnormal, plan for [facility] on Monday.   3. HTN: BP increased during stress test. No symptoms of CP for dyspnea. Continue to monitor. Continue metoprolol. Will convert to PO Cardizem today.    4. Hyperglyceridemia: triglycerides severely elevated at 792. Unable to calculate LDL. HDL is low at 22. LFTs ok. Currently on gemfibrozil.   5. DM: poorly controlled. Hgb A1c elevated at 8.6.  Continue SSI. Metformin on hold.    LOS: 2 days   Brittainy M. Ladoris Gene 10/13/2014 8:54 AM   The patient was seen, examined and discussed with Brittainy M. Rosita Fire, PA-C and I agree with the above.   74 year old male admitted with new onset atrial fibrillation that spontaneously cardioverted to SR with administration of flecainide. The patient had minimal troponin elevation most probably secondary to demand ischemia associated with a-fib with RVR. His echocardiogram showed normal LVEF and no regional wall motion abnormalities. He is undergoing a stress test today. If normal we will discharge him home. His BP is elevated, I would increase his metoprolol to 50 mg po BID. We will follow in the clinic within 1 week.  He also has poorly controlled DM, metformin should be restarted and he needs to follow with his PCP. His severely elevated TAG despite being on gemfibrozil is most probably sec to uncontrolled diabetes. We will refer to our lipid clinic.  Dorothy Spark, MD 10/13/2014

## 2014-10-15 LAB — HEMOGLOBIN A1C
Hgb A1c MFr Bld: 8.4 % — ABNORMAL HIGH (ref 4.8–5.6)
Mean Plasma Glucose: 194 mg/dL

## 2014-10-17 ENCOUNTER — Telehealth: Payer: Self-pay | Admitting: *Deleted

## 2014-10-17 NOTE — Telephone Encounter (Signed)
[name] - Client [name] Patient Name: Michael [name] Gender: Male DOB: 1941-03-21 Age: 74 Y 10 D Return Phone Number: [phone] (Primary) Address: [address] City/State/Zip: [place] [address] Client [name] - Client Client Site [name] - Day Physician [name], [name] Contact Type Call Call Type Triage / Clinical Caller Name [name] Relationship To Patient Spouse Appointment Disposition EMR Appointment Not Necessary Info pasted into Epic Yes Return Phone Number [phone] (Primary) Chief Complaint Heart palpitations or irregular heartbeat Initial Comment Caller states husband was had a normal morning, but after walk, heart was racing. 217/117 pulse 116. After resting 152/103 hr 32, then laying down 128/86 hr 155. PreDisposition Call Doctor Nurse Assessment Nurse: [name], RN, [name] Date/Time [name] Time): 10/11/2014 11:22:56 AM Confirm and document reason for call. If symptomatic, describe symptoms. ---Caller states husband was had a normal morning, but after walk, heart was racing. 217/117 pulse 116. After resting 152/103 hr 132, then laying down 128/86 hr 155. - she checked his again and his BP is 154/111 and heart rate 134 and then up to 148 in just a second - states is heart is skipping beats and feel like it is about to beat out of his chest has never felt this way before - he is short of breath states he feels like he needs to burp and he can't feels a lot of pressure in chest and he can not get relief Has the patient traveled out of the country within the last 30 days? ---Not Applicable Does the patient require triage? ---Yes Related visit to physician within the last 2 weeks? ---N/A Does the PT have any chronic conditions? (i.e. diabetes, asthma, etc.) ---Yes List chronic conditions. ---see record known: diabetic high blood  pressure Guidelines Guideline Title Affirmed Question Affirmed Notes Nurse Date/Time (Eastern Time) Heart Rate and Heartbeat Questions Sounds like a lifethreatening emergency to the triager Michael [name] 10/11/2014 11:26:48 AM PLEASE NOTE: All timestamps contained within this report are represented as [place] Standard Time. CONFIDENTIALTY NOTICE: This fax transmission is intended only for the addressee. It contains information that is legally privileged, confidential or otherwise protected from use or disclosure. If you are not the intended recipient, you are strictly prohibited from reviewing, disclosing, copying using or disseminating any of this information or taking any action in reliance on or regarding this information. If you have received this fax in error, please notify us immediately by telephone so that we can arrange for its return to [name]. Phone: [phone], Toll-Free: [phone], Fax: [phone] Page: 2 of 2 Call Id: [id number] [name]. Time [name] Time) Disposition Final User 10/11/2014 11:27:19 AM Call EMS 911 Now Michael [name] 10/11/2014 11:29:44 AM Send To RN Personal [name], RN, [name] 10/11/2014 11:36:59 AM 911 Follow Up Call Attempted [name], RN, [name] Reason: EMS in home with pt now 10/11/2014 11:37:10 AM Clinical Call Yes [name], RN, Edwena Bunde Understands: Yes Disagree/Comply: Comply Care Advice Given Per Guideline CALL EMS 911 NOW: Immediate medical attention is needed. You need to hang up and call 911 (or an ambulance). (Triager Discretion: I'll call you back in a few minutes to be sure you were able to reach them.) CARE ADVICE given per Palpitations (Adult) guideline. After Care Instructions Given Call Event Type User Date / Time Description Comments User: [name], RN Date/Time [name] Time): 10/11/2014 11:36:36 AM [name] states EMS is there now - his BP is 150/122 heart rate 150  she did check his BS and it was 56 - they are going to take  him to hospital - advised [name] to call us back if she needs anything we will make the office aware he is going to hospital via ambulance

## 2014-10-26 HISTORY — PX: CARDIAC CATHETERIZATION: SHX172

## 2014-11-05 DIAGNOSIS — H43811 Vitreous degeneration, right eye: Secondary | ICD-10-CM | POA: Diagnosis not present

## 2014-11-05 DIAGNOSIS — H3581 Retinal edema: Secondary | ICD-10-CM | POA: Diagnosis not present

## 2014-11-05 DIAGNOSIS — H34811 Central retinal vein occlusion, right eye: Secondary | ICD-10-CM | POA: Diagnosis not present

## 2014-11-07 ENCOUNTER — Encounter: Payer: Self-pay | Admitting: Nurse Practitioner

## 2014-11-07 ENCOUNTER — Ambulatory Visit (INDEPENDENT_AMBULATORY_CARE_PROVIDER_SITE_OTHER): Payer: Medicare Other | Admitting: Nurse Practitioner

## 2014-11-07 VITALS — BP 170/68 | HR 71 | Ht 71.0 in | Wt 242.6 lb

## 2014-11-07 DIAGNOSIS — I10 Essential (primary) hypertension: Secondary | ICD-10-CM

## 2014-11-07 DIAGNOSIS — I4819 Other persistent atrial fibrillation: Secondary | ICD-10-CM | POA: Insufficient documentation

## 2014-11-07 DIAGNOSIS — E785 Hyperlipidemia, unspecified: Secondary | ICD-10-CM | POA: Diagnosis not present

## 2014-11-07 DIAGNOSIS — I480 Paroxysmal atrial fibrillation: Secondary | ICD-10-CM

## 2014-11-07 LAB — CBC WITH DIFFERENTIAL/PLATELET
BASOS PCT: 0.6 % (ref 0.0–3.0)
Basophils Absolute: 0 10*3/uL (ref 0.0–0.1)
EOS ABS: 0.1 10*3/uL (ref 0.0–0.7)
Eosinophils Relative: 0.9 % (ref 0.0–5.0)
HCT: 31.2 % — ABNORMAL LOW (ref 39.0–52.0)
Hemoglobin: 10.9 g/dL — ABNORMAL LOW (ref 13.0–17.0)
LYMPHS ABS: 0.5 10*3/uL — AB (ref 0.7–4.0)
LYMPHS PCT: 8.6 % — AB (ref 12.0–46.0)
MCHC: 35 g/dL (ref 30.0–36.0)
MCV: 82.3 fl (ref 78.0–100.0)
MONO ABS: 0.3 10*3/uL (ref 0.1–1.0)
MONOS PCT: 4.8 % (ref 3.0–12.0)
NEUTROS ABS: 5.4 10*3/uL (ref 1.4–7.7)
Neutrophils Relative %: 85.1 % — ABNORMAL HIGH (ref 43.0–77.0)
Platelets: 253 10*3/uL (ref 150.0–400.0)
RBC: 3.78 Mil/uL — ABNORMAL LOW (ref 4.22–5.81)
RDW: 17.5 % — AB (ref 11.5–15.5)
WBC: 6.4 10*3/uL (ref 4.0–10.5)

## 2014-11-07 LAB — BASIC METABOLIC PANEL
BUN: 19 mg/dL (ref 6–23)
CALCIUM: 9 mg/dL (ref 8.4–10.5)
CO2: 27 mEq/L (ref 19–32)
CREATININE: 1.03 mg/dL (ref 0.40–1.50)
Chloride: 101 mEq/L (ref 96–112)
GFR: 75.43 mL/min (ref >60.00)
Glucose, Bld: 230 mg/dL — ABNORMAL HIGH (ref 70–99)
POTASSIUM: 4.2 meq/L (ref 3.5–5.1)
Sodium: 135 mEq/L (ref 135–145)

## 2014-11-07 MED ORDER — METOPROLOL TARTRATE 50 MG PO TABS: 50.0000 mg | ORAL_TABLET | Freq: Two times a day (BID) | ORAL | Status: DC

## 2014-11-07 NOTE — Patient Instructions (Addendum)
Medication Instructions:  Increase Lopressor ( 50 mg ) twice a day, new prescription sent into your pharmacy today  Labwork: bmet/cbc  Testing/Procedures: None  Follow-Up with Dr. Mare Ferrari in June  Any Other Special Instructions Will Be Listed Below (If Applicable).  Patient was given Eliquis sample and card

## 2014-11-07 NOTE — Progress Notes (Signed)
Patient Name: Michael [name] Date of Encounter: 11/07/2014  Primary Care Provider:  [name], MD Primary Cardiologist:  Vaughan Browner, MD   Chief Complaint  74 year old male with recent admission for A. fib and RVR who presents for follow-up.  Past Medical History   Past Medical History  Diagnosis Date  . Type II diabetes mellitus   . Hypertension   . Hyperlipidemia   . Gout   . Arthritis   . Hypothyroidism   . Personal history of colonic polyps 2007, 2008    adenoma each time, largest 12 mm in 2007  . Iron deficiency anemia   . AVM (arteriovenous malformation) of colon   . Allergy   . H/O transfusion of whole blood   . PAF (paroxysmal atrial fibrillation)     a. 09/2014: Converted on Dilt;  b. CHA2DS2VASc = 3-->eliquis;  c. 09/2014 Echo: EF 55-60%, mild LVH, mildly dil LA.  [name] Elevated troponin     a. 09/2014 in setting of AF RVR-->Myoview: EF 49%, no ischemia/infarct->Med Rx.   Past Surgical History  Procedure Laterality Date  . Rotator cuff repair      left  . Knee arthroscopy      right  . Colonoscopy w/ polypectomy  04/09/2006    12 mm adenoma  . Colonoscopy w/ polypectomy  06/17/2007    5 mm adenoma  . Colonoscopy  02/10/2011    internal hemorrhoids  . Esophagogastroduodenoscopy  12/23/2011    Procedure: ESOPHAGOGASTRODUODENOSCOPY (EGD);  Surgeon: Irene Shipper, MD;  Location: [facility];  Service: Endoscopy;  Laterality: N/A;  with small bowel bx's  . Givens capsule study  12/28/2011  . Laparoscopic appendectomy N/A 02/09/2014    Procedure: APPENDECTOMY LAPAROSCOPIC;  Surgeon: Leighton Ruff, MD;  Location: [facility];  Service: General;  Laterality: N/A;  . Appendectomy  02/09/2014    Allergies  Allergies  Allergen Reactions  . Percocet [Oxycodone-Acetaminophen] Nausea And Vomiting    HPI  74 year old male with the above problem list. He was recently admitted to [facility] secondary to atrial fibrillation with rapid ventricular response in mid March. In  that setting, he did have mild elevation of troponin. He was placed on IV diltiazem and subsequently converted spontaneously to sinus rhythm. He was also placed on eliquis in the setting of a CHA2DS2VASc of 3. Because of elevated troponin, he underwent stress testing which was negative for ischemia. Echocardiogram showed normal LV function. He was subsequently discharged. As he does have a history of small bowel AVMs with prior GI bleed, PPI therapy has been continued. Since discharge, he has done well. He has noted an occasional palpitation lasting 1 or 2 seconds but nothing sustained. He denies chest pain, dyspnea, PND, orthopnea, dizziness, syncope, edema, or early satiety.  Home Medications  Prior to Admission medications   Medication Sig Start Date End Date Taking? Authorizing Provider  acetaminophen (TYLENOL) 650 MG CR tablet Take 650 mg by mouth every 8 (eight) hours as needed. Take two tablets by mouth as needed   Yes Historical Provider, MD  allopurinol (ZYLOPRIM) 100 MG tablet Take 200 mg by mouth every morning.    Yes Historical Provider, MD  apixaban (ELIQUIS) 5 MG TABS tablet Take 1 tablet (5 mg total) by mouth 2 (two) times daily. 10/13/14  Yes Brittainy Erie Noe, PA-C  Blood Glucose Monitoring Suppl (ONE TOUCH ULTRA 2) W/DEVICE KIT Use as directed Dx E11.9 05/18/14  Yes Aleksei [name] V, MD  Cholecalciferol 1000 UNITS TBDP Take 1,000  Units by mouth daily.   Yes Historical Provider, MD  clobetasol (TEMOVATE) 0.05 % external solution Apply 1 application topically 2 (two) times daily. Apply to scalp   Yes Historical Provider, MD  diltiazem (CARDIZEM CD) 120 MG 24 hr capsule Take 1 capsule (120 mg total) by mouth daily. 10/13/14  Yes Brittainy Erie Noe, PA-C  ergocalciferol (VITAMIN D2) 50000 UNITS capsule Take 1 capsule (50,000 Units total) by mouth once a week. 11/08/13  Yes Aleksei [name] V, MD  ferrous sulfate 325 (65 FE) MG tablet Take 325 mg by mouth 3 (three) times daily with  meals.   Yes Historical Provider, MD  gemfibrozil (LOPID) 600 MG tablet Take 600 mg by mouth 2 (two) times daily before a meal.   Yes Historical Provider, MD  glipiZIDE (GLUCOTROL XL) 10 MG 24 hr tablet Take 1 tablet (10 mg total) by mouth every morning. 07/10/14  Yes Aleksei [name] V, MD  glucose blood (ONE TOUCH ULTRA TEST) test strip Use as instructed 05/18/14  Yes Aleksei [name] V, MD  Lancets (ONETOUCH ULTRASOFT) lancets Use to check blood sugars daily 05/18/14  Yes Aleksei [name] V, MD  levothyroxine (SYNTHROID, LEVOTHROID) 150 MCG tablet Take 1 tablet (150 mcg total) by mouth daily before breakfast. 06/25/14  Yes Aleksei [name] V, MD  lisinopril (PRINIVIL,ZESTRIL) 20 MG tablet Take 1 tablet (20 mg total) by mouth every morning. 09/07/14  Yes Aleksei [name] V, MD  metoprolol tartrate (LOPRESSOR) 50 MG tablet Take 1 tablet (50 mg total) by mouth 2 (two) times daily. 11/07/14  Yes Rogelia Mire, NP  omeprazole (PRILOSEC) 40 MG capsule Take 1 capsule (40 mg total) by mouth daily. 10/08/14  Yes Aleksei [name] V, MD  predniSONE (DELTASONE) 5 MG tablet Take 5 mg by mouth every morning.    Yes Historical Provider, MD    Review of Systems  As above, doing well.  He has noted an occasional, rare fluttering in his chest lasting a second or 2. He denies chest pain, dyspnea, pnd, orthopnea, n, v, dizziness, syncope, edema, weight gain, or early satiety.  All other systems reviewed and are otherwise negative except as noted above.  Physical Exam  VS:  BP 170/68 mmHg  Pulse 71  Ht 5' 11"  (1.803 m)  Wt 242 lb 9.6 oz (110.043 kg)  BMI 33.85 kg/m2 , BMI Body mass index is 33.85 kg/(m^2). GEN: Well nourished, well developed, in no acute distress. HEENT: normal. Neck: Supple, no JVD, carotid bruits, or masses. Cardiac: RRR, no rubs, or gallops. 2/6 systolic ejection murmur loudest at the upper sternal border but heard throughout. No clubbing, cyanosis, edema.  Radials/DP/PT 2+ and  equal bilaterally.  Respiratory:  Respirations regular and unlabored, clear to auscultation bilaterally. GI: Soft, nontender, nondistended, BS + x 4. MS: no deformity or atrophy. Skin: warm and dry, no rash. Neuro:  Strength and sensation are intact. Psych: Normal affect.  Accessory Clinical Findings  ECG - regular sinus rhythm, 71, LVH.  Assessment & Plan  1.  Paroxysmal atrial fibrillation: Status post recent admission. He has been maintaining sinus rhythm on beta blocker and diltiazem therapy. He also is tolerating eliquis. His blood pressure today is 170/68 and 160/90 on repeat. In that setting I will titrate his diltiazem to 180 mg daily. I will check a CBC and be met today in the setting of new eliquis therapy.  2. Hypertension: As above, blood pressure elevated today. He reports that it can run into the 160s and 170s at home.  I'm increasing diltiazem to 180 mg daily. He will continue to follow his blood pressures at home.  3. Diabetes mellitus: Followed by primary care.  4. Recent troponin elevation: This occurred in the setting of A. fib with RVR. Stress testing was negative and echo showed normal LV function. Continue beta blocker therapy. He is not on aspirin the setting of ongoing eliquis therapy.  5. History of GI bleed: Check H&H today.  6. Disposition: Follow-up with Dr. Mare Ferrari in 2-3 months or sooner if necessary.    Murray Hodgkins, NP 11/07/2014, 4:39 PM

## 2014-11-13 ENCOUNTER — Emergency Department (HOSPITAL_COMMUNITY): Payer: Medicare Other

## 2014-11-13 ENCOUNTER — Encounter (HOSPITAL_COMMUNITY): Payer: Self-pay | Admitting: Emergency Medicine

## 2014-11-13 ENCOUNTER — Inpatient Hospital Stay (HOSPITAL_COMMUNITY)
Admission: EM | Admit: 2014-11-13 | Discharge: 2014-11-16 | DRG: 287 | Disposition: A | Discharge status: Home / Self Care | Payer: Medicare Other | Attending: Interventional Cardiology | Admitting: Interventional Cardiology

## 2014-11-13 DIAGNOSIS — M109 Gout, unspecified: Secondary | ICD-10-CM | POA: Diagnosis present

## 2014-11-13 DIAGNOSIS — L4050 Arthropathic psoriasis, unspecified: Secondary | ICD-10-CM | POA: Diagnosis present

## 2014-11-13 DIAGNOSIS — I248 Other forms of acute ischemic heart disease: Secondary | ICD-10-CM | POA: Diagnosis not present

## 2014-11-13 DIAGNOSIS — E669 Obesity, unspecified: Secondary | ICD-10-CM | POA: Diagnosis present

## 2014-11-13 DIAGNOSIS — D500 Iron deficiency anemia secondary to blood loss (chronic): Secondary | ICD-10-CM | POA: Diagnosis not present

## 2014-11-13 DIAGNOSIS — E781 Pure hyperglyceridemia: Secondary | ICD-10-CM | POA: Diagnosis present

## 2014-11-13 DIAGNOSIS — J9811 Atelectasis: Secondary | ICD-10-CM | POA: Diagnosis not present

## 2014-11-13 DIAGNOSIS — R079 Chest pain, unspecified: Secondary | ICD-10-CM | POA: Diagnosis not present

## 2014-11-13 DIAGNOSIS — Z6833 Body mass index (BMI) 33.0-33.9, adult: Secondary | ICD-10-CM | POA: Diagnosis not present

## 2014-11-13 DIAGNOSIS — Q2733 Arteriovenous malformation of digestive system vessel: Secondary | ICD-10-CM

## 2014-11-13 DIAGNOSIS — I6523 Occlusion and stenosis of bilateral carotid arteries: Secondary | ICD-10-CM

## 2014-11-13 DIAGNOSIS — I771 Stricture of artery: Secondary | ICD-10-CM | POA: Diagnosis present

## 2014-11-13 DIAGNOSIS — Z86010 Personal history of colonic polyps: Secondary | ICD-10-CM

## 2014-11-13 DIAGNOSIS — I480 Paroxysmal atrial fibrillation: Principal | ICD-10-CM | POA: Diagnosis present

## 2014-11-13 DIAGNOSIS — Z885 Allergy status to narcotic agent status: Secondary | ICD-10-CM

## 2014-11-13 DIAGNOSIS — I4891 Unspecified atrial fibrillation: Secondary | ICD-10-CM | POA: Diagnosis not present

## 2014-11-13 DIAGNOSIS — E119 Type 2 diabetes mellitus without complications: Secondary | ICD-10-CM | POA: Diagnosis present

## 2014-11-13 DIAGNOSIS — E039 Hypothyroidism, unspecified: Secondary | ICD-10-CM | POA: Diagnosis present

## 2014-11-13 DIAGNOSIS — I5031 Acute diastolic (congestive) heart failure: Secondary | ICD-10-CM | POA: Diagnosis not present

## 2014-11-13 DIAGNOSIS — I10 Essential (primary) hypertension: Secondary | ICD-10-CM | POA: Diagnosis present

## 2014-11-13 DIAGNOSIS — I2510 Atherosclerotic heart disease of native coronary artery without angina pectoris: Secondary | ICD-10-CM | POA: Diagnosis not present

## 2014-11-13 DIAGNOSIS — M1990 Unspecified osteoarthritis, unspecified site: Secondary | ICD-10-CM | POA: Diagnosis present

## 2014-11-13 DIAGNOSIS — I669 Occlusion and stenosis of unspecified cerebral artery: Secondary | ICD-10-CM | POA: Diagnosis not present

## 2014-11-13 DIAGNOSIS — Z87891 Personal history of nicotine dependence: Secondary | ICD-10-CM

## 2014-11-13 DIAGNOSIS — Z7901 Long term (current) use of anticoagulants: Secondary | ICD-10-CM

## 2014-11-13 DIAGNOSIS — E1151 Type 2 diabetes mellitus with diabetic peripheral angiopathy without gangrene: Secondary | ICD-10-CM

## 2014-11-13 HISTORY — DX: Disorder of arteries and arterioles, unspecified: I77.9

## 2014-11-13 HISTORY — DX: Peripheral vascular disease, unspecified: I73.9

## 2014-11-13 HISTORY — DX: Cardiac arrhythmia, unspecified: I49.9

## 2014-11-13 HISTORY — DX: Atherosclerotic heart disease of native coronary artery without angina pectoris: I25.10

## 2014-11-13 HISTORY — DX: Psoriasis, unspecified: L40.9

## 2014-11-13 HISTORY — DX: Gastro-esophageal reflux disease without esophagitis: K21.9

## 2014-11-13 LAB — CBC
HEMATOCRIT: 33.4 % — AB (ref 39.0–52.0)
HEMOGLOBIN: 11.4 g/dL — AB (ref 13.0–17.0)
MCH: 28.6 pg (ref 26.0–34.0)
MCHC: 34.1 g/dL (ref 30.0–36.0)
MCV: 83.9 fL (ref 78.0–100.0)
Platelets: 184 10*3/uL (ref 150–400)
RBC: 3.98 MIL/uL — ABNORMAL LOW (ref 4.22–5.81)
RDW: 16.5 % — AB (ref 11.5–15.5)
WBC: 9.4 10*3/uL (ref 4.0–10.5)

## 2014-11-13 LAB — GLUCOSE, CAPILLARY
Glucose-Capillary: 286 mg/dL — ABNORMAL HIGH (ref 70–99)
Glucose-Capillary: 315 mg/dL — ABNORMAL HIGH (ref 70–99)
Glucose-Capillary: 188 mg/dL — ABNORMAL HIGH (ref 70–99)

## 2014-11-13 LAB — BASIC METABOLIC PANEL
Anion gap: 12 (ref 5–15)
BUN: 22 mg/dL (ref 6–23)
CO2: 22 mmol/L (ref 19–32)
Calcium: 8.7 mg/dL (ref 8.4–10.5)
Chloride: 102 mmol/L (ref 96–112)
Creatinine, Ser: 0.94 mg/dL (ref 0.50–1.35)
GFR calc Af Amer: >90 mL/min (ref >90)
GFR, EST NON AFRICAN AMERICAN: 82 mL/min — AB (ref >90)
Glucose, Bld: 393 mg/dL — ABNORMAL HIGH (ref 70–99)
POTASSIUM: 3.9 mmol/L (ref 3.5–5.1)
Sodium: 136 mmol/L (ref 135–145)

## 2014-11-13 LAB — HEPATIC FUNCTION PANEL
ALK PHOS: 61 U/L (ref 39–117)
ALT: 15 U/L (ref 0–53)
AST: 24 U/L (ref 0–37)
Albumin: 3.4 g/dL — ABNORMAL LOW (ref 3.5–5.2)
BILIRUBIN TOTAL: 0.6 mg/dL (ref 0.3–1.2)
Total Protein: 5.8 g/dL — ABNORMAL LOW (ref 6.0–8.3)

## 2014-11-13 LAB — MRSA PCR SCREENING: MRSA by PCR: NEGATIVE

## 2014-11-13 LAB — PROTIME-INR
INR: 1.15 (ref 0.00–1.49)
Prothrombin Time: 14.9 seconds (ref 11.6–15.2)

## 2014-11-13 LAB — I-STAT TROPONIN, ED: TROPONIN I, POC: 0.02 ng/mL (ref 0.00–0.08)

## 2014-11-13 LAB — BRAIN NATRIURETIC PEPTIDE: B Natriuretic Peptide: 502.4 pg/mL — ABNORMAL HIGH (ref 0.0–100.0)

## 2014-11-13 LAB — TROPONIN I
TROPONIN I: 0.84 ng/mL — AB (ref <0.031)
Troponin I: 1.86 ng/mL (ref <0.031)

## 2014-11-13 LAB — TSH: TSH: 1.442 u[IU]/mL (ref 0.350–4.500)

## 2014-11-13 MED ORDER — AMIODARONE HCL 200 MG PO TABS
400.0000 mg | ORAL_TABLET | Freq: Every day | ORAL | Status: DC
  Administered 2014-11-13 – 2014-11-15 (×3): 400 mg via ORAL
  Filled 2014-11-13 (×3): qty 2

## 2014-11-13 MED ORDER — PREDNISONE 5 MG PO TABS: 5.0000 mg | ORAL_TABLET | Freq: Every day | ORAL | Status: DC | PRN

## 2014-11-13 MED ORDER — FUROSEMIDE 10 MG/ML IJ SOLN
40.0000 mg | Freq: Once | INTRAMUSCULAR | Status: AC
  Administered 2014-11-13: 40 mg via INTRAVENOUS
  Filled 2014-11-13: qty 4

## 2014-11-13 MED ORDER — PANTOPRAZOLE SODIUM 40 MG PO TBEC
40.0000 mg | DELAYED_RELEASE_TABLET | Freq: Every day | ORAL | Status: DC
Start: 2014-11-13 — End: 2014-11-16
  Administered 2014-11-13 – 2014-11-16 (×4): 40 mg via ORAL
  Filled 2014-11-13 (×4): qty 1

## 2014-11-13 MED ORDER — LEVOTHYROXINE SODIUM 75 MCG PO TABS
150.0000 ug | ORAL_TABLET | Freq: Every day | ORAL | Status: DC
  Administered 2014-11-14 – 2014-11-16 (×3): 150 ug via ORAL
  Filled 2014-11-13 (×3): qty 2

## 2014-11-13 MED ORDER — AMIODARONE LOAD VIA INFUSION
150.0000 mg | Freq: Once | INTRAVENOUS | Status: AC
  Administered 2014-11-13: 150 mg via INTRAVENOUS
  Filled 2014-11-13: qty 83.34

## 2014-11-13 MED ORDER — FERROUS SULFATE 325 (65 FE) MG PO TABS
325.0000 mg | ORAL_TABLET | Freq: Three times a day (TID) | ORAL | Status: DC
  Administered 2014-11-13 – 2014-11-16 (×7): 325 mg via ORAL
  Filled 2014-11-13 (×7): qty 1

## 2014-11-13 MED ORDER — DEXTROSE 5 % IV SOLN
5.0000 mg/h | Freq: Once | INTRAVENOUS | Status: AC
  Administered 2014-11-13: 5 mg/h via INTRAVENOUS
  Filled 2014-11-13: qty 100

## 2014-11-13 MED ORDER — APIXABAN 5 MG PO TABS
5.0000 mg | ORAL_TABLET | Freq: Two times a day (BID) | ORAL | Status: DC
  Administered 2014-11-13: 5 mg via ORAL
  Filled 2014-11-13: qty 1

## 2014-11-13 MED ORDER — AMIODARONE HCL IN DEXTROSE 360-4.14 MG/200ML-% IV SOLN: 30.0000 mg/h | INTRAVENOUS | Status: DC

## 2014-11-13 MED ORDER — INSULIN ASPART 100 UNIT/ML ~~LOC~~ SOLN
0.0000 [IU] | Freq: Three times a day (TID) | SUBCUTANEOUS | Status: DC
  Administered 2014-11-13: 11 [IU] via SUBCUTANEOUS
  Administered 2014-11-14: 5 [IU] via SUBCUTANEOUS
  Administered 2014-11-14: 8 [IU] via SUBCUTANEOUS
  Administered 2014-11-14 – 2014-11-16 (×5): 3 [IU] via SUBCUTANEOUS

## 2014-11-13 MED ORDER — ACETAMINOPHEN 325 MG PO TABS
650.0000 mg | ORAL_TABLET | Freq: Four times a day (QID) | ORAL | Status: DC | PRN
Start: 2014-11-13 — End: 2014-11-15
  Administered 2014-11-13: 650 mg via ORAL
  Filled 2014-11-13: qty 2

## 2014-11-13 MED ORDER — AMIODARONE HCL 200 MG PO TABS: 400.0000 mg | ORAL_TABLET | Freq: Two times a day (BID) | ORAL | Status: DC

## 2014-11-13 MED ORDER — CHOLECALCIFEROL 25 MCG (1000 UT) PO TBDP: 1000.0000 [IU] | ORAL_TABLET | Freq: Every day | ORAL | Status: DC

## 2014-11-13 MED ORDER — AMIODARONE HCL IN DEXTROSE 360-4.14 MG/200ML-% IV SOLN
60.0000 mg/h | INTRAVENOUS | Status: DC
  Administered 2014-11-13: 60 mg/h via INTRAVENOUS
  Filled 2014-11-13 (×2): qty 200

## 2014-11-13 MED ORDER — METOPROLOL TARTRATE 50 MG PO TABS
50.0000 mg | ORAL_TABLET | Freq: Two times a day (BID) | ORAL | Status: DC
  Administered 2014-11-13 – 2014-11-16 (×7): 50 mg via ORAL
  Filled 2014-11-13 (×7): qty 1

## 2014-11-13 MED ORDER — DILTIAZEM HCL ER COATED BEADS 180 MG PO CP24
180.0000 mg | ORAL_CAPSULE | Freq: Every day | ORAL | Status: DC
  Administered 2014-11-13 – 2014-11-16 (×4): 180 mg via ORAL
  Filled 2014-11-13 (×4): qty 1

## 2014-11-13 MED ORDER — HEPARIN (PORCINE) IN NACL 100-0.45 UNIT/ML-% IJ SOLN
1700.0000 [IU]/h | INTRAMUSCULAR | Status: DC
  Administered 2014-11-13: 1400 [IU]/h via INTRAVENOUS
  Administered 2014-11-14 – 2014-11-15 (×2): 1700 [IU]/h via INTRAVENOUS
  Filled 2014-11-13 (×3): qty 250

## 2014-11-13 MED ORDER — MORPHINE SULFATE 4 MG/ML IJ SOLN
4.0000 mg | Freq: Once | INTRAMUSCULAR | Status: AC
Start: 2014-11-13 — End: 2014-11-13
  Administered 2014-11-13: 4 mg via INTRAVENOUS
  Filled 2014-11-13: qty 1

## 2014-11-13 MED ORDER — VITAMIN D 1000 UNITS PO TABS
1000.0000 [IU] | ORAL_TABLET | Freq: Every day | ORAL | Status: DC
  Administered 2014-11-13 – 2014-11-16 (×3): 1000 [IU] via ORAL
  Filled 2014-11-13 (×4): qty 1

## 2014-11-13 MED ORDER — GEMFIBROZIL 600 MG PO TABS
600.0000 mg | ORAL_TABLET | Freq: Two times a day (BID) | ORAL | Status: DC
  Administered 2014-11-13 – 2014-11-16 (×5): 600 mg via ORAL
  Filled 2014-11-13 (×9): qty 1

## 2014-11-13 MED ORDER — ALLOPURINOL 100 MG PO TABS
200.0000 mg | ORAL_TABLET | Freq: Every morning | ORAL | Status: DC
  Administered 2014-11-13 – 2014-11-16 (×4): 200 mg via ORAL
  Filled 2014-11-13 (×4): qty 2

## 2014-11-13 MED ORDER — CLOBETASOL PROPIONATE 0.05 % EX CREA
1.0000 "application " | TOPICAL_CREAM | Freq: Two times a day (BID) | CUTANEOUS | Status: DC
  Administered 2014-11-14 – 2014-11-16 (×3): 1 via TOPICAL
  Filled 2014-11-13: qty 15

## 2014-11-13 MED ORDER — LISINOPRIL 20 MG PO TABS
20.0000 mg | ORAL_TABLET | Freq: Every morning | ORAL | Status: DC
  Administered 2014-11-14: 20 mg via ORAL
  Filled 2014-11-13 (×2): qty 1

## 2014-11-13 MED ORDER — GLIPIZIDE ER 10 MG PO TB24
10.0000 mg | ORAL_TABLET | Freq: Every morning | ORAL | Status: DC
  Administered 2014-11-16: 10 mg via ORAL
  Filled 2014-11-13 (×3): qty 1

## 2014-11-13 MED ORDER — ONDANSETRON HCL 4 MG/2ML IJ SOLN
4.0000 mg | Freq: Once | INTRAMUSCULAR | Status: AC
  Administered 2014-11-13: 4 mg via INTRAVENOUS
  Filled 2014-11-13: qty 2

## 2014-11-13 NOTE — Progress Notes (Addendum)
Pt converted to sinus brady at 1513, EKG done to confirm it.  Orlie Pollen, PA notified, no new orders received.  Will continue to closely monitor.   Orders received to DC Amiodarone gtt and to give p.o.  Will continue to closely monitor.

## 2014-11-13 NOTE — ED Notes (Signed)
Dr. Miachel Roux at bedside (cardiology)

## 2014-11-13 NOTE — Progress Notes (Signed)
Called by RN re: elevated cardiac enzymes  Pt had a significantly elevated heart rate 4 hours despite IV Cardizem. He was started on IV amiodarone and spontaneously converted to SR. He was taken off IV Rx and is on po Cardizem and Amiodarone.  He was on Eliquis prior to admission and this was continued.  Initial point-of-care troponin was 0.02. However, a subsequent troponin was elevated at 0.84. Michael [name] has not had chest pain and currently he is resting comfortably.  Plan: Continue current medical therapy for now. If his next troponin is significantly elevated, he develops chest pain or his ECG becomes abnormal, will hold Eliquis and add heparin.   His EF was 55-60 percent by echocardiogram in March 2016 and this will not be repeated.  Reassess in a.m. or when necessary.  Rosaria Ferries, PA-C 11/13/2014 6:08 PM Beeper [phone]

## 2014-11-13 NOTE — Progress Notes (Signed)
Inpatient Diabetes Program Recommendations  AACE/ADA: New Consensus Statement on Inpatient Glycemic Control (2013)  Target Ranges:  Prepandial:   less than 140 mg/dL      Peak postprandial:   less than 180 mg/dL (1-2 hours)      Critically ill patients:  140 - 180 mg/dL   Results for CHER, [facility] (MRN [id number]) as of 11/13/2014 13:22  Ref. Range 11/13/2014 12:04  Glucose-Capillary Latest Ref Range: 70-99 mg/dL 286 (H)   Reason for Visit: A.Fib RVR  Diabetes history: DM 2 Outpatient Diabetes medications: Glipizide 10 mg Daily Current orders for Inpatient glycemic control: None  Inpatient Diabetes Program Recommendations  Correction (SSI): Patient's glucose on admission was 393 mg/dl and is now 286 mg/dl. While inpatient, please consider ordering CBG's and Novolog 0-15 units TID, and Novolog 0-5 units QHS for bedtime coverage.  Thanks,  Tama Headings RN, MSN, [facility] Inpatient Diabetes Coordinator Team Pager [phone]

## 2014-11-13 NOTE — Progress Notes (Signed)
Notified Dr. Philbert Riser that pt's troponin is trending up; last result is 1.86 which is up from 0.84 earlier today. Orders received to hold pt's scheduled dose of eliquis and start IV heparin. Pt is chest pain free. Will continue to monitor.

## 2014-11-13 NOTE — Progress Notes (Signed)
CRITICAL VALUE ALERT  Critical value received:  Troponin  Date of notification:  11/13/14  Time of notification:  [id number]  Critical value read back: yes  Nurse who received alert:  Gerlene Fee  MD notified (1st page):  Rosaria Ferries  Time of first page:  [phone]  MD notified (2nd page):  Time of second page:  Responding MD:  Margreta Journey  Time MD responded:  1800

## 2014-11-13 NOTE — H&P (Addendum)
Patient ID: Michael [name] MRN: [id number], DOB/AGE: 03/04/1941   Admit date: 11/13/2014   Primary Physician: [name], MD Primary Cardiologist: Vaughan Browner, MD   Pt. Profile:  Michael [name] is a 74 year old male with past medical history of HTN, HLD, DM, PAF, hypothyroidism, history of iron deficiency anemia as result of proximal small bowel AVM who presented to ED 11/13/14 for afib with RVR.   HPI:  74 year old male with the above problem list. He was recently admitted 10/11/14 to [facility] secondary to atrial fibrillation with rapid ventricular response. In that setting, he did have mild elevation of troponin. He was placed on IV diltiazem and subsequently converted spontaneously to sinus rhythm. He was also placed on eliquis in the setting of a CHA2DS2VASc of 3. Because of elevated troponin, he underwent stress testing which was negative for ischemia. Echocardiogram showed normal LV function. He was subsequently discharged. As he does have a history of small bowel AVMs with prior GI bleed, PPI therapy has been continued. Since discharge, he has done well. He was maintaining sinus rhythm during his last clinic visit  11/07/14 and diltiazem was increased to 160m daily.   He reported an occasional palpitation lasting 1 or 2 seconds since discharge but nothing sustained. Patient has missed yesterday's 4/18 does of PO Cardizem. He does not remember if he has increased does of diltiazem. He did not missed any other medications. He was doing well upuntil this 3am 4/19 when he woke up with sick stomach. He had a few small brown vomite and then he developed dyspnea, weakness, substernal chest pressure. No radiation of pain. Patient ate usual sandwitch last night. EMS reports one episode of vomiting with bile appearance while enroute. He has received 3277mASA, 1SL nitro, 1031mardizem bolus and 31m53mrdizem infusion 4 mg zofran by EMS. POC trop is negative. INR 1.15. Currently his chest pain  is 4/10, initially it was 8/10, and he was unable to describe type of chest pain. He denies dizziness, syncope, edema, early satiety, abd pain, bright red blood per rectum, melena, hematemesis, or change in vision. He is drinking one cup of caffeinated coffee everyday.  Problem List  Past Medical History  Diagnosis Date  . Type II diabetes mellitus   . Hypertension   . Hyperlipidemia   . Gout   . Arthritis   . Hypothyroidism   . Personal history of colonic polyps 2007, 2008    adenoma each time, largest 12 mm in 2007  . Iron deficiency anemia   . AVM (arteriovenous malformation) of colon   . Allergy   . H/O transfusion of whole blood   . PAF (paroxysmal atrial fibrillation)     a. 09/2014: Converted on Dilt;  b. CHA2DS2VASc = 3-->eliquis;  c. 09/2014 Echo: EF 55-60%, mild LVH, mildly dil LA.  . ElMarland Kitchenvated troponin     a. 09/2014 in setting of AF RVR-->Myoview: EF 49%, no ischemia/infarct->Med Rx.    Past Surgical History  Procedure Laterality Date  . Rotator cuff repair      left  . Knee arthroscopy      right  . Colonoscopy w/ polypectomy  04/09/2006    12 mm adenoma  . Colonoscopy w/ polypectomy  06/17/2007    5 mm adenoma  . Colonoscopy  02/10/2011    internal hemorrhoids  . Esophagogastroduodenoscopy  12/23/2011    Procedure: ESOPHAGOGASTRODUODENOSCOPY (EGD);  Surgeon: JohnIrene Shipper;  [facility];  Service: Endoscopy;  Laterality:  N/A;  with small bowel bx's  . Givens capsule study  12/28/2011  . Laparoscopic appendectomy N/A 02/09/2014    Procedure: APPENDECTOMY LAPAROSCOPIC;  Surgeon: Leighton Ruff, MD;  Location: [facility];  Service: General;  Laterality: N/A;  . Appendectomy  02/09/2014     Allergies  Allergies  Allergen Reactions  . Percocet [Oxycodone-Acetaminophen] Nausea And Vomiting     Home Medications  Prior to Admission medications   Medication Sig Start Date End Date Taking? Authorizing Provider  acetaminophen (TYLENOL) 650 MG CR tablet Take  1,300 mg by mouth every 8 (eight) hours as needed for pain.    Yes Historical Provider, MD  allopurinol (ZYLOPRIM) 100 MG tablet Take 200 mg by mouth every morning.    Yes Historical Provider, MD  apixaban (ELIQUIS) 5 MG TABS tablet Take 1 tablet (5 mg total) by mouth 2 (two) times daily. 10/13/14  Yes Brittainy Erie Noe, PA-C  Blood Glucose Monitoring Suppl (ONE TOUCH ULTRA 2) W/DEVICE KIT Use as directed Dx E11.9 05/18/14  Yes Aleksei [name] V, MD  Cholecalciferol 1000 UNITS TBDP Take 1,000 Units by mouth daily.   Yes Historical Provider, MD  clobetasol (TEMOVATE) 0.05 % external solution Apply 1 application topically 2 (two) times daily. Apply to scalp   Yes Historical Provider, MD  diltiazem (CARDIZEM CD) 120 MG 24 hr capsule Take 1 capsule (120 mg total) by mouth daily. 10/13/14  Yes Brittainy Erie Noe, PA-C  ergocalciferol (VITAMIN D2) 50000 UNITS capsule Take 1 capsule (50,000 Units total) by mouth once a week. 11/08/13  Yes Aleksei [name] V, MD  ferrous sulfate 325 (65 FE) MG tablet Take 325 mg by mouth 3 (three) times daily with meals.   Yes Historical Provider, MD  gemfibrozil (LOPID) 600 MG tablet Take 600 mg by mouth 2 (two) times daily before a meal.   Yes Historical Provider, MD  glipiZIDE (GLUCOTROL XL) 10 MG 24 hr tablet Take 1 tablet (10 mg total) by mouth every morning. 07/10/14  Yes Aleksei [name] V, MD  glucose blood (ONE TOUCH ULTRA TEST) test strip Use as instructed 05/18/14  Yes Aleksei [name] V, MD  Lancets (ONETOUCH ULTRASOFT) lancets Use to check blood sugars daily 05/18/14  Yes Aleksei [name] V, MD  levothyroxine (SYNTHROID, LEVOTHROID) 150 MCG tablet Take 1 tablet (150 mcg total) by mouth daily before breakfast. 06/25/14  Yes Aleksei [name] V, MD  lisinopril (PRINIVIL,ZESTRIL) 20 MG tablet Take 1 tablet (20 mg total) by mouth every morning. 09/07/14  Yes Aleksei [name] V, MD  metoprolol tartrate (LOPRESSOR) 50 MG tablet Take 1 tablet (50 mg total) by  mouth 2 (two) times daily. 11/07/14  Yes Rogelia Mire, NP  omeprazole (PRILOSEC) 40 MG capsule Take 1 capsule (40 mg total) by mouth daily. 10/08/14  Yes Aleksei [name] V, MD  predniSONE (DELTASONE) 5 MG tablet Take 5 mg by mouth daily as needed (hand swelling).    Yes Historical Provider, MD    Family History  Family History  Problem Relation Age of Onset  . Malignant hyperthermia Neg Hx   . Heart attack Father   . Lung cancer Father   . Diabetes Father   . Stroke Father   . Hypertension Father   . Stroke Mother   . Hypertension Mother   . Cancer Brother     liver  . Heart disease Brother     chf   Family Status  Relation Status Death Age  . Father Deceased   . Mother Deceased   .  Brother Deceased   . Brother Alive      Social History  History   Social History  . Marital Status: Married    Spouse Name: N/A  . Number of Children: N/A  . Years of Education: 12   Occupational History  .  Retired    Social History Main Topics  . Smoking status: Former Smoker    Quit date: 03/29/2006  . Smokeless tobacco: Never Used  . Alcohol Use: 0.6 oz/week    1 Cans of beer per week     Comment: occasional alcohol intake  . Drug Use: No  . Sexual Activity: Not Currently   Other Topics Concern  . Not on file   Social History Narrative   Regular exercise-no   Caffeine Use-yes     Review of Systems  All other systems reviewed and are otherwise negative except as noted above.  Physical Exam  Blood pressure 139/86, pulse 136, temperature 98 F (36.7 C), temperature source Oral, resp. rate 18, height _0  (1.803 m), weight 240 lb (108.863 kg), SpO2 100 %.  General: Pleasant, obese, NAD Psych: Normal affect. Neuro: Alert and oriented X 3. Moves all extremities spontaneously. HEENT: Normal  Neck: Supple without bruits. Mild JVD. Lungs:  Resp regular and unlabored. bibasilar rales.  Heart: irregularly irregular with rapid rate, no murmurs. Abdomen: Soft,  non-tender, non-distended, BS + x 4.  Extremities: No clubbing, cyanosis or edema. DP/Radials 2+ and equal bilaterally. Skin: warm and scattered psoriatic lesions.   Labs  Lab Results  Component Value Date   WBC 9.4 11/13/2014   HGB 11.4* 11/13/2014   HCT 33.4* 11/13/2014   MCV 83.9 11/13/2014   PLT 184 11/13/2014     Recent Labs Lab 11/13/14 0704  NA 136  K 3.9  CL 102  CO2 22  BUN 22  CREATININE 0.94  CALCIUM 8.7  GLUCOSE 393*   Lab Results  Component Value Date   CHOL 165 10/12/2014   HDL 22* 10/12/2014   LDLCALC UNABLE TO CALCULATE IF TRIGLYCERIDE OVER 400 mg/dL 10/12/2014   TRIG 792* 10/12/2014     Radiology/Studies  Dg Chest Port 1 View  11/13/2014   CLINICAL DATA:  Chest pain and short of breath  EXAM: PORTABLE CHEST - 1 VIEW  COMPARISON:  10/11/2014  FINDINGS: Pulmonary vascular congestion with probable interstitial edema. Findings suggest fluid overload. No pleural effusion.  Hypoventilation with mild bibasilar atelectasis.  IMPRESSION: Pulmonary vascular congestion, possible mild interstitial edema  Mild bibasilar atelectasis.   Electronically Signed   By: Franchot Gallo M.D.   On: 11/13/2014 07:41    ECG Ventricular Rate: 149 PR Interval:  QRS Duration: 101 QT Interval: 296 QTC Calculation: 466 R Axis: 40 Text Interpretation: Atrial fibrillation with rapid V-rate Probable LVH  with secondary repol abnrm ST depression, probably rate related Baseline  wander in lead(s) II V3   Echo 10/12/14 Study Conclusions  - Left ventricle: The cavity size was normal. Wall thickness was increased in a pattern of mild LVH. Systolic function was normal. The estimated ejection fraction was in the range of 55% to 60%. - Aortic valve: Small systolic gradient without signficant stenosis. Valve area (VTI): 1.49 cm^2. Valve area (Vmax): 1.22 cm^2. Valve area (Vmean): 1.27 cm^2. - Left atrium: The atrium was mildly dilated. - Atrial septum: No defect  or patent foramen ovale was identified.  ASSESSMENT AND PLAN Principal Problem:   Atrial fibrillation with RVR Active Problems:   DM (diabetes mellitus)  HTN (hypertension), benign   Iron deficiency anemia due to chronic blood loss   Obesity   Hypertriglyceridemia   Psoriatic arthritis   Michael [name] is a 74 year old male with past medical history of HTN, HLD, DM, PAF, hypothyroidism, history of iron deficiency anemia as result of proximal small bowel AVM who presented to ED 11/13/14 for afib with RVR.   PLAN:  1. A. Fib with RVR - CHA2DS2-Vasc score 3 (age, HTN, DM). On Eliquis. h/o GIB with AVM problematic. Hemoglobin improved to 11.4 from 10.2 on 10/13/14. -Patient felt the symptom abruptly started around 3am with vomite, was in his USOH prior to that. HR poorly controlled despite IV diltiazem. Continue dilt gtt and then dilt po 14m. Plan to cardioversion tomorrow. No ischemic workup now as POC is neg.  -On Lisinopril 28mqd and metoprolol 5013mID at home. He had not taking his home medicine today. Will resume home meds. - Echo 3/18 showed LVEF of 55-60% with thick wall in pattern of mild LVH. Chest x ray showed pulmonary vascular congestion, possible mild interstitial edema  Mild bibasilar atelectasis. Cr 0.94. Will add one does of IV lasix.  2. HTN - Improved since admission. Continue meds as above.  3. DM  - Uncontrolled. Manage by sliding scale.   Signed, BhaLeanor KailA-C 11/13/2014, 9:05 AM  I have examined the patient and reviewed assessment and plan and discussed with patient.  Agree with above as stated.  Plan for cardioversion in AM.  Give dose of Lasix.  R/o for MI.  Will start amiodarone to control rhythm since he is quite symptomatic when he goes into AFib.  Check baseline TSH and LFTs also.  He will need to go to stepdown bed at least at this time due to rapid heart rate.  COntinue Eliquis at this time.  Acute diastolic heart failure due to high rate.    Laelynn Blizzard S.

## 2014-11-13 NOTE — Care Management Note (Signed)
    Page 1 of 1   11/16/2014     4:27:58 PM CARE MANAGEMENT NOTE 11/16/2014  Patient:  Michael [name], Michael [name]   Account Number:  [id number]  Date Initiated:  11/13/2014  Documentation initiated by:  Elenor Quinones  Subjective/Objective Assessment:   Pt admitted with A fib with RVR, pt missed Cardizem dose on previous day from admit     Action/Plan:   Pt is from home.  CM will continue to monitor for medication needs/management as needed   Anticipated DC Date:  11/23/2014   Anticipated DC Plan:  HOME/SELF CARE         Choice offered to / List presented to:             Status of service:  Completed, signed off Medicare Important Message given?  YES (If response is "NO", the following Medicare IM given date fields will be blank) Date Medicare IM given:  11/16/2014 Medicare IM given by:  Elenor Quinones Date Additional Medicare IM given:   Additional Medicare IM given by:    Discharge Disposition:  HOME/SELF CARE  Per UR Regulation:  Reviewed for med. necessity/level of care/duration of stay  If discussed at [place] of Stay Meetings, dates discussed:    Comments:  11/13/14  Elenor Quinones, RN, BSN [phone].  Case Manger will continue to follow pt in order to  determine if pt will need medication assistance (pt missed previous dose). [name]

## 2014-11-13 NOTE — Progress Notes (Signed)
P.O Cardizem administered at 1243, Cardizem gtt turned off at 14:41.  Will continue to closely monitor.

## 2014-11-13 NOTE — ED Notes (Signed)
Pt states pain is 8/10-- dr lockwood aware-- orders received

## 2014-11-13 NOTE — Progress Notes (Signed)
ANTICOAGULATION CONSULT NOTE - Initial Consult  Pharmacy Consult for heparin Indication: chest pain/ACS  Allergies  Allergen Reactions  . Percocet [Oxycodone-Acetaminophen] Nausea And Vomiting    Patient Measurements: Height: 5' 11"  (180.3 cm) Weight: 240 lb 3.2 oz (108.954 kg) IBW/kg (Calculated) : 75.3 Heparin Dosing Weight: 99 kg  Vital Signs: Temp: 98.4 F (36.9 C) (04/19 2005) Temp Source: Oral (04/19 2005) BP: 135/58 mmHg (04/19 2005) Pulse Rate: 64 (04/19 2005)  Labs:  Recent Labs  11/13/14 0704 11/13/14 1513 11/13/14 1941  HGB 11.4*  --   --   HCT 33.4*  --   --   PLT 184  --   --   LABPROT 14.9  --   --   INR 1.15  --   --   CREATININE 0.94  --   --   TROPONINI  --  0.84* 1.86*    Estimated Creatinine Clearance: 89.2 mL/min (by C-G formula based on Cr of 0.94).   Medical History: Past Medical History  Diagnosis Date  . Hypertension   . Hyperlipidemia   . Gout   . Arthritis   . Hypothyroidism   . Personal history of colonic polyps 2007, 2008    adenoma each time, largest 12 mm in 2007  . Iron deficiency anemia   . AVM (arteriovenous malformation) of colon   . Allergy   . H/O transfusion of whole blood   . PAF (paroxysmal atrial fibrillation)     a. 09/2014: Converted on Dilt;  b. CHA2DS2VASc = 3-->eliquis;  c. 09/2014 Echo: EF 55-60%, mild LVH, mildly dil LA.  [name] Elevated troponin     a. 09/2014 in setting of AF RVR-->Myoview: EF 49%, no ischemia/infarct->Med Rx.  [name] Dysrhythmia     ATRIAL FIBRILATION  . Type II diabetes mellitus     TYPE 2  . GERD (gastroesophageal reflux disease)   . Psoriasis     Medications:  Prescriptions prior to admission  Medication Sig Dispense Refill Last Dose  . acetaminophen (TYLENOL) 650 MG CR tablet Take 1,300 mg by mouth every 8 (eight) hours as needed for pain.    unk  . allopurinol (ZYLOPRIM) 100 MG tablet Take 200 mg by mouth every morning.    11/12/2014 at Unknown time  . apixaban (ELIQUIS) 5 MG TABS tablet  Take 1 tablet (5 mg total) by mouth 2 (two) times daily. 60 tablet 10 11/12/2014 at Unknown time  . Blood Glucose Monitoring Suppl (ONE TOUCH ULTRA 2) W/DEVICE KIT Use as directed Dx E11.9 1 each 0 11/12/2014 at Unknown time  . Cholecalciferol 1000 UNITS TBDP Take 1,000 Units by mouth daily.   11/12/2014 at Unknown time  . clobetasol (TEMOVATE) 0.05 % external solution Apply 1 application topically 2 (two) times daily. Apply to scalp   11/12/2014 at Unknown time  . diltiazem (CARDIZEM CD) 120 MG 24 hr capsule Take 1 capsule (120 mg total) by mouth daily. 30 capsule 5 11/11/2014  . ergocalciferol (VITAMIN D2) 50000 UNITS capsule Take 1 capsule (50,000 Units total) by mouth once a week. 6 capsule 0 11/10/2014  . ferrous sulfate 325 (65 FE) MG tablet Take 325 mg by mouth 3 (three) times daily with meals.   11/12/2014 at Unknown time  . gemfibrozil (LOPID) 600 MG tablet Take 600 mg by mouth 2 (two) times daily before a meal.   11/12/2014 at Unknown time  . glipiZIDE (GLUCOTROL XL) 10 MG 24 hr tablet Take 1 tablet (10 mg total) by mouth every morning. [place]  tablet 3 11/12/2014 at Unknown time  . glucose blood (ONE TOUCH ULTRA TEST) test strip Use as instructed 30 each 11 11/12/2014 at Unknown time  . Lancets (ONETOUCH ULTRASOFT) lancets Use to check blood sugars daily 30 each 11 11/12/2014 at Unknown time  . levothyroxine (SYNTHROID, LEVOTHROID) 150 MCG tablet Take 1 tablet (150 mcg total) by mouth daily before breakfast. 90 tablet 2 11/12/2014 at Unknown time  . lisinopril (PRINIVIL,ZESTRIL) 20 MG tablet Take 1 tablet (20 mg total) by mouth every morning. 90 tablet 3 11/12/2014 at Unknown time  . metoprolol tartrate (LOPRESSOR) 50 MG tablet Take 1 tablet (50 mg total) by mouth 2 (two) times daily. 60 tablet 6 11/12/2014 at 2100  . omeprazole (PRILOSEC) 40 MG capsule Take 1 capsule (40 mg total) by mouth daily. 90 capsule 3 11/12/2014 at Unknown time  . predniSONE (DELTASONE) 5 MG tablet Take 5 mg by mouth daily as needed  (hand swelling).    11/12/2014 at Unknown time    Assessment: 74 year old man to start on IV heparin for r/o ACS.  Troponins are trending up.  Mr. Demeyer was on apixaban prior to admission.  His last dose was at 1243 today.  Apixaban now on hold and heparin infusion to start.  Heparin levels will be elevated due to apixaban exposure, so aPTTs will be used to monitor anticoagulation effects.  Goal of Therapy:  aPTT 66-102 seconds Monitor platelets by anticoagulation protocol: Yes   Plan:  Start heparin infusion at 1400 units/hr Check anti-Xa level in 6 hours and daily while on heparin Continue to monitor H&H and platelets  Candie Mile 11/13/2014,9:59 PM

## 2014-11-13 NOTE — ED Provider Notes (Signed)
CSN: [id number]     Arrival date & time 11/13/14  7681 History   First MD Initiated Contact with Patient 11/13/14 [phone]     Chief Complaint  Patient presents with  . Chest Pain  . Shortness of Breath   HPI  Patient presents with dyspnea, nausea, weakness. Symptoms began about 4 hours prior to my evaluation. She was generally well prior to that onset. Since onset symptoms of been persistent, with chest pressure throughout the anterior thorax. No syncope, though the patient feels lightheaded. No weakness symmetrically, no dysarthria, or facial droop.  The patient was recently hospitalized for atrial fibrillation. Patient was discharged on Eliquis, Cardizem. He has been taking his Eliquis, but was unable to take Cardizem yesterday.  EMS reports that the patient was tachycardic, diaphoretic in route. He received 60 mg Cardizem in route. History of present illness provided by the patient and his wife.   Past Medical History  Diagnosis Date  . Type II diabetes mellitus   . Hypertension   . Hyperlipidemia   . Gout   . Arthritis   . Hypothyroidism   . Personal history of colonic polyps 2007, 2008    adenoma each time, largest 12 mm in 2007  . Iron deficiency anemia   . AVM (arteriovenous malformation) of colon   . Allergy   . H/O transfusion of whole blood   . PAF (paroxysmal atrial fibrillation)     a. 09/2014: Converted on Dilt;  b. CHA2DS2VASc = 3-->eliquis;  c. 09/2014 Echo: EF 55-60%, mild LVH, mildly dil LA.  [name] Elevated troponin     a. 09/2014 in setting of AF RVR-->Myoview: EF 49%, no ischemia/infarct->Med Rx.   Past Surgical History  Procedure Laterality Date  . Rotator cuff repair      left  . Knee arthroscopy      right  . Colonoscopy w/ polypectomy  04/09/2006    12 mm adenoma  . Colonoscopy w/ polypectomy  06/17/2007    5 mm adenoma  . Colonoscopy  02/10/2011    internal hemorrhoids  . Esophagogastroduodenoscopy  12/23/2011    Procedure:  ESOPHAGOGASTRODUODENOSCOPY (EGD);  Surgeon: Irene Shipper, MD;  Location: [facility];  Service: Endoscopy;  Laterality: N/A;  with small bowel bx's  . Givens capsule study  12/28/2011  . Laparoscopic appendectomy N/A 02/09/2014    Procedure: APPENDECTOMY LAPAROSCOPIC;  Surgeon: Leighton Ruff, MD;  Location: [facility];  Service: General;  Laterality: N/A;  . Appendectomy  02/09/2014   Family History  Problem Relation Age of Onset  . Malignant hyperthermia Neg Hx   . Heart attack Father   . Lung cancer Father   . Diabetes Father   . Stroke Father   . Hypertension Father   . Stroke Mother   . Hypertension Mother   . Cancer Brother     liver  . Heart disease Brother     chf   History  Substance Use Topics  . Smoking status: Former Smoker    Quit date: 03/29/2006  . Smokeless tobacco: Never Used  . Alcohol Use: 0.6 oz/week    1 Cans of beer per week     Comment: occasional alcohol intake    Review of Systems  Constitutional:       Per HPI, otherwise negative  HENT:       Per HPI, otherwise negative  Respiratory:       Per HPI, otherwise negative  Cardiovascular:       Per HPI, otherwise negative  Gastrointestinal: Negative for vomiting.  Endocrine:       Negative aside from HPI  Genitourinary:       Neg aside from HPI   Musculoskeletal:       Per HPI, otherwise negative  Skin: Negative.   Neurological: Negative for syncope.      Allergies  Percocet  Home Medications   Prior to Admission medications   Medication Sig Start Date End Date Taking? Authorizing Provider  acetaminophen (TYLENOL) 650 MG CR tablet Take 1,300 mg by mouth every 8 (eight) hours as needed for pain.    Yes Historical Provider, MD  allopurinol (ZYLOPRIM) 100 MG tablet Take 200 mg by mouth every morning.    Yes Historical Provider, MD  apixaban (ELIQUIS) 5 MG TABS tablet Take 1 tablet (5 mg total) by mouth 2 (two) times daily. 10/13/14  Yes Brittainy Erie Noe, PA-C  Blood Glucose Monitoring Suppl  (ONE TOUCH ULTRA 2) W/DEVICE KIT Use as directed Dx E11.9 05/18/14  Yes Aleksei [name] V, MD  Cholecalciferol 1000 UNITS TBDP Take 1,000 Units by mouth daily.   Yes Historical Provider, MD  clobetasol (TEMOVATE) 0.05 % external solution Apply 1 application topically 2 (two) times daily. Apply to scalp   Yes Historical Provider, MD  diltiazem (CARDIZEM CD) 120 MG 24 hr capsule Take 1 capsule (120 mg total) by mouth daily. 10/13/14  Yes Brittainy Erie Noe, PA-C  ergocalciferol (VITAMIN D2) 50000 UNITS capsule Take 1 capsule (50,000 Units total) by mouth once a week. 11/08/13  Yes Aleksei [name] V, MD  ferrous sulfate 325 (65 FE) MG tablet Take 325 mg by mouth 3 (three) times daily with meals.   Yes Historical Provider, MD  gemfibrozil (LOPID) 600 MG tablet Take 600 mg by mouth 2 (two) times daily before a meal.   Yes Historical Provider, MD  glipiZIDE (GLUCOTROL XL) 10 MG 24 hr tablet Take 1 tablet (10 mg total) by mouth every morning. 07/10/14  Yes Aleksei [name] V, MD  glucose blood (ONE TOUCH ULTRA TEST) test strip Use as instructed 05/18/14  Yes Aleksei [name] V, MD  Lancets (ONETOUCH ULTRASOFT) lancets Use to check blood sugars daily 05/18/14  Yes Aleksei [name] V, MD  levothyroxine (SYNTHROID, LEVOTHROID) 150 MCG tablet Take 1 tablet (150 mcg total) by mouth daily before breakfast. 06/25/14  Yes Aleksei [name] V, MD  lisinopril (PRINIVIL,ZESTRIL) 20 MG tablet Take 1 tablet (20 mg total) by mouth every morning. 09/07/14  Yes Aleksei [name] V, MD  metoprolol tartrate (LOPRESSOR) 50 MG tablet Take 1 tablet (50 mg total) by mouth 2 (two) times daily. 11/07/14  Yes Rogelia Mire, NP  omeprazole (PRILOSEC) 40 MG capsule Take 1 capsule (40 mg total) by mouth daily. 10/08/14  Yes Aleksei [name] V, MD  predniSONE (DELTASONE) 5 MG tablet Take 5 mg by mouth daily as needed (hand swelling).    Yes Historical Provider, MD   BP 176/85 mmHg  Pulse 142  Temp(Src) 98 F (36.7 C)  (Oral)  Resp 18  Ht 5' 11"  (1.803 m)  Wt 240 lb (108.863 kg)  BMI 33.49 kg/m2  SpO2 100% Physical Exam  Constitutional: He is oriented to person, place, and time. He appears well-developed. He appears distressed.  HENT:  Head: Normocephalic and atraumatic.  Eyes: Conjunctivae and EOM are normal.  Cardiovascular: An irregularly irregular rhythm present. Tachycardia present.   Pulmonary/Chest: Effort normal. No stridor. No respiratory distress.  Abdominal: He exhibits no distension.  Musculoskeletal: He exhibits no edema.  Neurological:  He is alert and oriented to person, place, and time.  Skin: Skin is warm. He is diaphoretic. There is pallor.  Psychiatric: He has a normal mood and affect.  Nursing note and vitals reviewed.   ED Course  Procedures (including critical care time) Labs Review Labs Reviewed  BASIC METABOLIC PANEL - Abnormal; Notable for the following:    Glucose, Bld 393 (*)    GFR calc non Af Amer 82 (*)    All other components within normal limits  CBC - Abnormal; Notable for the following:    RBC 3.98 (*)    Hemoglobin 11.4 (*)    HCT 33.4 (*)    RDW 16.5 (*)    All other components within normal limits  PROTIME-INR  Randolm Idol, ED    Imaging Review Dg Chest Port 1 View  11/13/2014   CLINICAL DATA:  Chest pain and short of breath  EXAM: PORTABLE CHEST - 1 VIEW  COMPARISON:  10/11/2014  FINDINGS: Pulmonary vascular congestion with probable interstitial edema. Findings suggest fluid overload. No pleural effusion.  Hypoventilation with mild bibasilar atelectasis.  IMPRESSION: Pulmonary vascular congestion, possible mild interstitial edema  Mild bibasilar atelectasis.   Electronically Signed   By: Franchot Gallo M.D.   On: 11/13/2014 07:41     EKG Interpretation   Date/Time:  Tuesday November 13 2014 06:41:56 EDT Ventricular Rate:  149 PR Interval:    QRS Duration: 101 QT Interval:  296 QTC Calculation: 466 R Axis:   40 Text Interpretation:  Atrial  fibrillation with rapid V-rate Probable LVH  with secondary repol abnrm ST depression, probably rate related Baseline  wander in lead(s) II V3 Confirmed by Lita Mains  MD, DAVID ([id number]) on  11/13/2014 6:48:28 AM        Update: Cardiology has been contacted.   Update: Patient now has sharp CP   7:32 AM  Patient's pain has resolved. HR 130's     2D Echo 10/12/14 Study Conclusions  - Left ventricle: The cavity size was normal. Wall thickness was   increased in a pattern of mild LVH. Systolic function was normal.   The estimated ejection fraction was in the range of 55% to 60%. - Aortic valve: Small systolic gradient without signficant   stenosis. Valve area (VTI): 1.49 cm^2. Valve area (Vmax): 1.22   cm^2. Valve area (Vmean): 1.27 cm^2. - Left atrium: The atrium was mildly dilated. - Atrial septum: No defect or patent foramen ovale was identified.     NST 10/12/14 FINDINGS: Perfusion: No decreased activity in the left ventricle on stress imaging to suggest reversible ischemia or infarction.   Wall Motion: There is hypokinesia of the septum. Overall mild global hypokinesia.   Left Ventricular Ejection Fraction: 49 %   End diastolic volume 448 ml   End systolic volume 75 ml   IMPRESSION: 1. No reversible ischemia or infarction.   2. Septal hypokinesia.   3. Left ventricular ejection fraction 49%   4. Intermediate-risk stress test findings*.  (Based on EF)   CHADSVASC2 - 3   9:06 AM CP resolved HR 130's, dilt @10   10:15 AM HR 130-140- no CP. Cardiology has seen and evaluated the patient.   MDM  Patient with history of atrial fibrillation presents with chest pain. Patient has a history of atrial fibrillation with rapid ventricular response, recent admission, and was generally well prior to developing his symptoms a few hours ago. Your initial labs are reassuring for low suspicion of ongoing coronary ischemia, but the  patient remained with notable  arrhythmia in spite of initiation of Cardizem drip. No evidence for concurrent infection. After several hours of increased Cardizem drip, patient may tachycardic, though pain resolved. Patient required admission to the cardiology unit for further evaluation and management.  CRITICAL CARE Performed by: Carmin Muskrat Total critical care time: 35 Critical care time was exclusive of separately billable procedures and treating other patients. Critical care was necessary to treat or prevent imminent or life-threatening deterioration. Critical care was time spent personally by me on the following activities: development of treatment plan with patient and/or surrogate as well as nursing, discussions with consultants, evaluation of patient's response to treatment, examination of patient, obtaining history from patient or surrogate, ordering and performing treatments and interventions, ordering and review of laboratory studies, ordering and review of radiographic studies, pulse oximetry and re-evaluation of patient's condition.    Carmin Muskrat, MD 11/13/14 1016

## 2014-11-13 NOTE — ED Notes (Signed)
EMS reports called to pts home for increased sob, CP, n/v, and lightheadedness; pt reports missing yesterdays dose of PO cardizem; pt reports one episode of vomiting before calling 911; EMS reports one episode of vomiting with bile appearance while enroute; 324mg  ASA, 1 SL nitro, 10mg  cardizem bolus and 50mg  cardizem infusion 4 mg zofran given enroute.

## 2014-11-14 ENCOUNTER — Encounter (HOSPITAL_COMMUNITY)
Admission: EM | Disposition: A | Discharge status: Home / Self Care | Payer: Self-pay | Source: Home / Self Care | Attending: Interventional Cardiology

## 2014-11-14 DIAGNOSIS — I10 Essential (primary) hypertension: Secondary | ICD-10-CM

## 2014-11-14 DIAGNOSIS — I480 Paroxysmal atrial fibrillation: Principal | ICD-10-CM

## 2014-11-14 LAB — TROPONIN I
Troponin I: 1.02 ng/mL (ref <0.031)
Troponin I: 1.14 ng/mL (ref <0.031)
Troponin I: 2 ng/mL (ref <0.031)

## 2014-11-14 LAB — CBC
HEMATOCRIT: 31 % — AB (ref 39.0–52.0)
Hemoglobin: 10.3 g/dL — ABNORMAL LOW (ref 13.0–17.0)
MCH: 28.2 pg (ref 26.0–34.0)
MCHC: 33.2 g/dL (ref 30.0–36.0)
MCV: 84.9 fL (ref 78.0–100.0)
Platelets: 157 10*3/uL (ref 150–400)
RBC: 3.65 MIL/uL — ABNORMAL LOW (ref 4.22–5.81)
RDW: 16.8 % — AB (ref 11.5–15.5)
WBC: 4.9 10*3/uL (ref 4.0–10.5)

## 2014-11-14 LAB — GLUCOSE, CAPILLARY
GLUCOSE-CAPILLARY: 215 mg/dL — AB (ref 70–99)
Glucose-Capillary: 169 mg/dL — ABNORMAL HIGH (ref 70–99)
Glucose-Capillary: 253 mg/dL — ABNORMAL HIGH (ref 70–99)
Glucose-Capillary: 232 mg/dL — ABNORMAL HIGH (ref 70–99)

## 2014-11-14 LAB — HEPARIN LEVEL (UNFRACTIONATED)
Heparin Unfractionated: 0.97 IU/mL — ABNORMAL HIGH (ref 0.30–0.70)
Heparin Unfractionated: 0.66 IU/mL (ref 0.30–0.70)

## 2014-11-14 LAB — APTT
aPTT: 45 seconds — ABNORMAL HIGH (ref 24–37)
aPTT: 48 seconds — ABNORMAL HIGH (ref 24–37)

## 2014-11-14 LAB — OCCULT BLOOD X 1 CARD TO LAB, STOOL: Fecal Occult Bld: NEGATIVE

## 2014-11-14 SURGERY — CARDIOVERSION
Anesthesia: Monitor Anesthesia Care

## 2014-11-14 MED ORDER — ASPIRIN EC 81 MG PO TBEC
81.0000 mg | DELAYED_RELEASE_TABLET | Freq: Every day | ORAL | Status: DC
  Administered 2014-11-15 – 2014-11-16 (×2): 81 mg via ORAL
  Filled 2014-11-14 (×2): qty 1

## 2014-11-14 MED ORDER — SODIUM CHLORIDE 0.9 % IV SOLN
1.0000 mL/kg/h | INTRAVENOUS | Status: DC
  Administered 2014-11-15 (×2): 1 mL/kg/h via INTRAVENOUS

## 2014-11-14 MED ORDER — ASPIRIN 81 MG PO CHEW
81.0000 mg | CHEWABLE_TABLET | ORAL | Status: AC
  Administered 2014-11-15: 81 mg via ORAL
  Filled 2014-11-14: qty 1

## 2014-11-14 MED ORDER — LISINOPRIL 20 MG PO TABS
20.0000 mg | ORAL_TABLET | Freq: Every morning | ORAL | Status: DC
  Administered 2014-11-15 – 2014-11-16 (×2): 20 mg via ORAL
  Filled 2014-11-14 (×2): qty 1

## 2014-11-14 NOTE — Progress Notes (Signed)
[place] for heparin Indication: chest pain/ACS  Allergies  Allergen Reactions  . Percocet [Oxycodone-Acetaminophen] Nausea And Vomiting    Labs:  Recent Labs  11/13/14 0704  11/13/14 1941 11/14/14 0800 11/14/14 0810 11/14/14 1154 11/14/14 1642  HGB 11.4*  --   --  10.3*  --   --   --   HCT 33.4*  --   --  31.0*  --   --   --   PLT 184  --   --  157  --   --   --   APTT  --   --   --   --  45*  --  48*  LABPROT 14.9  --   --   --   --   --   --   INR 1.15  --   --   --   --   --   --   HEPARINUNFRC  --   --   --   --  0.97*  --  0.66  CREATININE 0.94  --   --   --   --   --   --   TROPONINI  --   < > 1.86*  --  1.14* 1.02*  --   < > = values in this interval not displayed.  Estimated Creatinine Clearance: 89.1 mL/min (by C-G formula based on Cr of 0.94).   Assessment: 74 year old man to start on IV heparin for r/o ACS.  Troponins are trending up.  Mr. Konopa was on apixaban prior to admission.  His last dose was 4/19.  Apixaban now on hold and heparin infusion to start.  Heparin levels will be elevated due to apixaban exposure, so aPTTs will be used to monitor anticoagulation effects.  Initial PTT = 45 seconds  Anticoag: Eliquis PTA for afib. APTT 48 with HL 0.66 in goal range for r/o MI.  Goal of Therapy:  HL 0.3-0.7 Monitor platelets by anticoagulation protocol: Yes   Plan:  Continue heparin at 1700 units/hr   Lamonica Trueba S. Alford Highland, PharmD, [place] Clinical Staff Pharmacist Pager [phone]   11/14/2014,5:40 PM

## 2014-11-14 NOTE — Progress Notes (Signed)
[place] for heparin Indication: chest pain/ACS  Allergies  Allergen Reactions  . Percocet [Oxycodone-Acetaminophen] Nausea And Vomiting    Labs:  Recent Labs  11/13/14 0055 11/13/14 0704 11/13/14 1513 11/13/14 1941 11/14/14 0800 11/14/14 0810  HGB  --  11.4*  --   --  10.3*  --   HCT  --  33.4*  --   --  31.0*  --   PLT  --  184  --   --  157  --   APTT  --   --   --   --   --  45*  LABPROT  --  14.9  --   --   --   --   INR  --  1.15  --   --   --   --   HEPARINUNFRC  --   --   --   --   --  0.97*  CREATININE  --  0.94  --   --   --   --   TROPONINI 2.00*  --  0.84* 1.86*  --   --     Estimated Creatinine Clearance: 89.1 mL/min (by C-G formula based on Cr of 0.94).   Assessment: 74 year old man to start on IV heparin for r/o ACS.  Troponins are trending up.  Mr. Pannier was on apixaban prior to admission.  His last dose was at 1243 today.  Apixaban now on hold and heparin infusion to start.  Heparin levels will be elevated due to apixaban exposure, so aPTTs will be used to monitor anticoagulation effects.  Initial PTT = 45 seconds  Goal of Therapy:  aPTT 66-102 seconds Monitor platelets by anticoagulation protocol: Yes   Plan:  Increase heparin to 1700 units / hr Follow up 6 hour PTT and Heparin level  Thank you. Anette Guarneri, PharmD [phone] 11/14/2014,9:22 AM

## 2014-11-14 NOTE — Progress Notes (Signed)
Patient: Michael [name] / Admit Date: 11/13/2014 / Date of Encounter: 11/14/2014, 9:35 AM   Subjective: Patient reports he feels better this morning. He denies chest pain, palpitations, shortness of breath, abdominal pain, nausea, vomiting.    Objective: Telemetry: Maintaining SR s/p spontaneous conversion yesterday, rate 55-60 Physical Exam: Blood pressure 101/89, pulse 64, temperature 98.4 F (36.9 C), temperature source Oral, resp. rate 19, height 5\' 11"  (1.803 m), weight 239 lb 12.2 oz (108.755 kg), SpO2 98 %. General: Well developed, well nourished WM, in no acute distress. Lying flat in bed Head: Normocephalic, atraumatic, sclera non-icteric, nares are without discharge. Neck:  JVP not elevated. Lungs: Clear bilaterally to auscultation without wheezes, rales, or rhonchi. Breathing is unlabored. Heart: RRR S1 S2 with no murmurs, rubs or gallops.  Abdomen: Soft, non-tender, non-distended with normoactive bowel sounds. No rebound/guarding. Extremities: No clubbing or cyanosis. No edema. Distal pedal pulses are 2+ and equal bilaterally. Neuro: Alert and oriented X 3. Moves all extremities spontaneously. Psych: Responds to questions appropriately with a normal affect.   Intake/Output Summary (Last 24 hours) at 11/14/14 0935 Last data filed at 11/13/14 2100  Gross per 24 hour  Intake    480 ml  Output      0 ml  Net    480 ml    Inpatient Medications:  . allopurinol  200 mg Oral q morning - 10a  . amiodarone  400 mg Oral Daily  . cholecalciferol  1,000 Units Oral Daily  . clobetasol cream  1 application Topical BID  . diltiazem  180 mg Oral Daily  . ferrous sulfate  325 mg Oral TID WC  . gemfibrozil  600 mg Oral BID AC  . glipiZIDE  10 mg Oral q morning - 10a  . insulin aspart  0-15 Units Subcutaneous TID WC  . levothyroxine  150 mcg Oral QAC breakfast  . lisinopril  20 mg Oral q morning - 10a  . metoprolol tartrate  50 mg Oral BID  . pantoprazole  40 mg Oral Daily    Infusions:  . heparin 1,400 Units/hr (11/13/14 2315)    Labs:  Recent Labs  11/13/14 0704  NA 136  K 3.9  CL 102  CO2 22  GLUCOSE 393*  BUN 22  CREATININE 0.94  CALCIUM 8.7    Recent Labs  11/13/14 1513  AST 24  ALT 15  ALKPHOS 61  BILITOT 0.6  PROT 5.8*  ALBUMIN 3.4*    Recent Labs  11/13/14 0704 11/14/14 0800  WBC 9.4 4.9  HGB 11.4* 10.3*  HCT 33.4* 31.0*  MCV 83.9 84.9  PLT 184 157    Recent Labs  11/13/14 0055 11/13/14 1513 11/13/14 1941  TROPONINI 2.00* 0.84* 1.86*    Radiology/Studies:  Dg Chest Port 1 View  11/13/2014   CLINICAL DATA:  Chest pain and short of breath  EXAM: PORTABLE CHEST - 1 VIEW  COMPARISON:  10/11/2014  FINDINGS: Pulmonary vascular congestion with probable interstitial edema. Findings suggest fluid overload. No pleural effusion.  Hypoventilation with mild bibasilar atelectasis.  IMPRESSION: Pulmonary vascular congestion, possible mild interstitial edema  Mild bibasilar atelectasis.   Electronically Signed   By: Franchot Gallo M.D.   On: 11/13/2014 07:41     Assessment and Plan  74 yo male with history of HTN, HLD, DM, PAF, hypothyroidism, and IDA s/p proximal small bowel AVM. Diagnosed with AF in 09/2014, spont converted to NSR, placed on Eliquis. Readmitted 04/19 for afib with RVR after missing 1 dose  of diltiazem. Started on IV amiodarone -> oral and spontaneously converted to SR. Initial troponin 0.02, subsequent rise to 0.84, latest 1.86. Eliquis held, heparin started. Last echo 03/18 EF 55-60%, mild LVH.   1. Paroxysmal atrial fibrillation with RVR - Now s/p spontaneous cardioversion, maintaining SR, HR/BP stable - Continue amiodarone 400 mg PO daily (new this admission), diltiazem 180 mg daily, metoprolol 50 mg BID - Continue heparin per pharmacy pending decision regarding ischemic workup  2. Elevated troponin - Troponin 0.02, 0.84, 1.86 - higher than expected with AF RVR although this did last quite a while. Will  discuss further plan with MD. May need cardiac cath to exclude ischemia as trigger. Stress test 09/2014 without ischemia or infarction, EF 49% with septal hypokinesia but normal WM/EF on echo.  - last dose of Eliquis was on 4/19 at 12:43pm. - Echo 03/18 EF 55-60%, mild LVH   3. HTN - follow on current regimen  4. IDA with history of small bowel AVM, no reported bleeding on anticoagulation - Hemoglobin stable (10.3, around baseline for patient even before Eliquis) - Continue iron supplementation   5. DM 6. Hypothyroidism, on levothyroxine 7. HLD  Signed, Melina Copa PA-C Pager: [phone]   Patient seen and examined. Agree with assessment and plan. Recommend cardiac cath with Trop increasing to 1.86 and with wall motion and EF 49% on prior nuclear imaging. 2/6 aortic murmur on exam; no significant stenosis on echo 09/2014. The risks and benefits of a cardiac catheterization including, but not limited to, death, stroke, MI, kidney damage and bleeding were discussed with the patient who indicates understanding and agrees to proceed. Last dose of eliquis was at noon yesterday, will set up for cath tomorrow afternoon and give clear liquids in am for breakfast.   Troy Sine, MD, [facility] 11/14/2014 3:21 PM

## 2014-11-15 ENCOUNTER — Encounter (HOSPITAL_COMMUNITY)
Admission: EM | Disposition: A | Discharge status: Home / Self Care | Payer: Self-pay | Source: Home / Self Care | Attending: Interventional Cardiology

## 2014-11-15 DIAGNOSIS — I248 Other forms of acute ischemic heart disease: Secondary | ICD-10-CM | POA: Insufficient documentation

## 2014-11-15 DIAGNOSIS — I2510 Atherosclerotic heart disease of native coronary artery without angina pectoris: Secondary | ICD-10-CM

## 2014-11-15 HISTORY — PX: LEFT HEART CATHETERIZATION WITH CORONARY ANGIOGRAM: SHX5451

## 2014-11-15 LAB — GLUCOSE, CAPILLARY
Glucose-Capillary: 198 mg/dL — ABNORMAL HIGH (ref 70–99)
Glucose-Capillary: 166 mg/dL — ABNORMAL HIGH (ref 70–99)
Glucose-Capillary: 164 mg/dL — ABNORMAL HIGH (ref 70–99)
Glucose-Capillary: 198 mg/dL — ABNORMAL HIGH (ref 70–99)

## 2014-11-15 LAB — CBC
HCT: 30.7 % — ABNORMAL LOW (ref 39.0–52.0)
Hemoglobin: 10 g/dL — ABNORMAL LOW (ref 13.0–17.0)
MCH: 27.9 pg (ref 26.0–34.0)
MCHC: 32.6 g/dL (ref 30.0–36.0)
MCV: 85.5 fL (ref 78.0–100.0)
PLATELETS: 140 10*3/uL — AB (ref 150–400)
RBC: 3.59 MIL/uL — ABNORMAL LOW (ref 4.22–5.81)
RDW: 16.6 % — AB (ref 11.5–15.5)
WBC: 3.9 10*3/uL — AB (ref 4.0–10.5)

## 2014-11-15 LAB — HEPARIN LEVEL (UNFRACTIONATED): HEPARIN UNFRACTIONATED: 0.46 [IU]/mL (ref 0.30–0.70)

## 2014-11-15 LAB — BASIC METABOLIC PANEL
Anion gap: 11 (ref 5–15)
BUN: 17 mg/dL (ref 6–23)
CHLORIDE: 104 mmol/L (ref 96–112)
CO2: 25 mmol/L (ref 19–32)
CREATININE: 1.1 mg/dL (ref 0.50–1.35)
Calcium: 8.3 mg/dL — ABNORMAL LOW (ref 8.4–10.5)
GFR calc non Af Amer: 65 mL/min — ABNORMAL LOW (ref >90)
GFR, EST AFRICAN AMERICAN: 75 mL/min — AB (ref >90)
GLUCOSE: 232 mg/dL — AB (ref 70–99)
POTASSIUM: 3.9 mmol/L (ref 3.5–5.1)
Sodium: 140 mmol/L (ref 135–145)

## 2014-11-15 SURGERY — LEFT HEART CATHETERIZATION WITH CORONARY ANGIOGRAM
Anesthesia: LOCAL

## 2014-11-15 MED ORDER — NITROGLYCERIN 1 MG/10 ML FOR IR/CATH LAB
INTRA_ARTERIAL | Status: AC
  Filled 2014-11-15: qty 10

## 2014-11-15 MED ORDER — VERAPAMIL HCL 2.5 MG/ML IV SOLN
INTRAVENOUS | Status: AC
  Filled 2014-11-15: qty 2

## 2014-11-15 MED ORDER — ACETAMINOPHEN 325 MG PO TABS: 650.0000 mg | ORAL_TABLET | ORAL | Status: DC | PRN

## 2014-11-15 MED ORDER — FENTANYL CITRATE (PF) 100 MCG/2ML IJ SOLN
INTRAMUSCULAR | Status: AC
  Filled 2014-11-15: qty 2

## 2014-11-15 MED ORDER — ASPIRIN 81 MG PO CHEW: 81.0000 mg | CHEWABLE_TABLET | Freq: Every day | ORAL | Status: DC

## 2014-11-15 MED ORDER — ONDANSETRON HCL 4 MG/2ML IJ SOLN: 4.0000 mg | Freq: Four times a day (QID) | INTRAMUSCULAR | Status: DC | PRN

## 2014-11-15 MED ORDER — MIDAZOLAM HCL 2 MG/2ML IJ SOLN
INTRAMUSCULAR | Status: AC
  Filled 2014-11-15: qty 2

## 2014-11-15 MED ORDER — HEPARIN (PORCINE) IN NACL 2-0.9 UNIT/ML-% IJ SOLN
INTRAMUSCULAR | Status: AC
  Filled 2014-11-15: qty 1000

## 2014-11-15 MED ORDER — SODIUM CHLORIDE 0.9 % IV SOLN
INTRAVENOUS | Status: AC
  Administered 2014-11-15: 16:00:00 via INTRAVENOUS

## 2014-11-15 MED ORDER — HEPARIN (PORCINE) IN NACL 100-0.45 UNIT/ML-% IJ SOLN
1700.0000 [IU]/h | INTRAMUSCULAR | Status: DC
  Administered 2014-11-16: 1700 [IU]/h via INTRAVENOUS
  Filled 2014-11-15: qty 250

## 2014-11-15 MED ORDER — HEPARIN SODIUM (PORCINE) 1000 UNIT/ML IJ SOLN
INTRAMUSCULAR | Status: AC
  Filled 2014-11-15: qty 1

## 2014-11-15 MED ORDER — LIDOCAINE HCL (PF) 1 % IJ SOLN
INTRAMUSCULAR | Status: AC
  Filled 2014-11-15: qty 30

## 2014-11-15 NOTE — Progress Notes (Signed)
ANTICOAGULATION CONSULT NOTE - Follow Up Consult  Pharmacy Consult for Heparin Indication: atrial fibrillation  Allergies  Allergen Reactions  . Percocet [Oxycodone-Acetaminophen] Nausea And Vomiting    Patient Measurements: Height: 5\' 11"  (180.3 cm) Weight: 239 lb 12.8 oz (108.773 kg) IBW/kg (Calculated) : 75.3 Heparin Dosing Weight: 98.4 kg  Vital Signs: Temp: 97.7 F (36.5 C) (04/21 1131) Temp Source: Oral (04/21 1131) BP: 157/72 mmHg (04/21 1131) Pulse Rate: 69 (04/21 1453)  Labs:  Recent Labs  11/13/14 0704  11/13/14 1941 11/14/14 0800 11/14/14 0810 11/14/14 1154 11/14/14 1642 11/15/14 0602  HGB 11.4*  --   --  10.3*  --   --   --  10.0*  HCT 33.4*  --   --  31.0*  --   --   --  30.7*  PLT 184  --   --  157  --   --   --  140*  APTT  --   --   --   --  45*  --  48*  --   LABPROT 14.9  --   --   --   --   --   --   --   INR 1.15  --   --   --   --   --   --   --   HEPARINUNFRC  --   --   --   --  0.97*  --  0.66 0.46  CREATININE 0.94  --   --   --   --   --   --  1.10  TROPONINI  --   < > 1.86*  --  1.14* 1.02*  --   --   < > = values in this interval not displayed.  Estimated Creatinine Clearance: 76.2 mL/min (by C-G formula based on Cr of 1.1).   Assessment: 74 year old man to start on IV heparin for r/o ACS.  Mr. Lewellyn was on apixaban prior to admission.  Anticoagulation: on apixaban PTA for afib.  Found to have NSTEMI. Troponins rising.  NSTEMI, likely type 2 demand ischemia from AF RVR but will stop eliquis and start heparin. Cath with mild-moderate disease, normal LV function, borderline significant AS. Resume heparin post-cath 8 hrs after TR band removed (1900)  Goal of Therapy:  Heparin level 0.3-0.7 units/ml Monitor platelets by anticoagulation protocol: Yes   Plan:  At 0300 on 4/22, resume IV heparin at 1700 units/hr Will check a heparin level 6 hrs afterwards. Daily HL/CBC.   Knoxx Boeding S. Alford Highland, PharmD, BCPS Clinical Staff  Pharmacist Pager [phone]  [name] Stillinger 11/15/2014,4:31 PM

## 2014-11-15 NOTE — CV Procedure (Signed)
     Left Heart Catheterization with Coronary Angiography Report  Michael [name]  74 y.o.  male 24-Jul-1941  Procedure Date: 11/15/2014 Referring Physician: Candee Furbish, M.D. Primary Cardiologist:Thomas Mare Ferrari, M.D.  INDICATIONS: Presentation with recurrent atrial fibrillation and rapid ventricular response. Elevated cardiac troponin levels.  PROCEDURE: 1. Left heart catheterization; 2. Coronary angiography; 3. Left ventriculography; 4. Gradient measurement across innominate artery stenosis  CONSENT:  The risks, benefits, and details of the procedure were explained in detail to the patient. Risks including death, stroke, heart attack, kidney injury, allergy, limb ischemia, bleeding and radiation injury were discussed.  The patient verbalized understanding and wanted to proceed.  Informed written consent was obtained.  PROCEDURE TECHNIQUE:  After Xylocaine anesthesia a 5 French Slender sheath was placed in the right radial artery with an angiocath and the modified Seldinger technique.  Coronary angiography was done using a 5 F JR4, JL 3.5 cm, and 3.5 EBU catheter.  Left ventriculography was done using the JR 4 catheter and hand injection.   The digital images were reviewed. We did a pullback pressure across the region of heavy calcification and stenosis in the innominate artery. The case was then terminated and hemostasis was achieved in the right radial using a wrist band and 9 cc of air.   CONTRAST:  Total of 110 cc.  COMPLICATIONS:  None   HEMODYNAMICS:  Aortic pressure 156/61 mmHg; LV pressure 173/0 mmHg; LVEDP 16 mmHg;  AV gradient 17 mmHg; pullback gradient across the innominate artery obstruction 34 mmHg (144/56 mmHg -107/54 mmHg)  ANGIOGRAPHIC DATA:   The left main coronary artery is widely patent.  The left anterior descending artery is widely patent and wraps around the left ventricular apex. The first diagonal contains 50-70% narrowing in its dominant branch..  The left  circumflex artery is patent. There is 2 obtuse marginal branches. The second obtuse marginal contains proximal eccentric 50-70% narrowing. The mid circumflex after the origin of the second obtuse marginal contains 50% narrowing..  The ramus intermedius is small and widely patent.  The right coronary artery is dominant and widely patent..   INNOMINATE ANGIOGRAPHY:  There is heavy calcification with at least 50% obstruction in the innominate artery before the large and   LEFT VENTRICULOGRAM:  Left ventricular angiogram was done in the 30 RAO projection and revealed normal LV cavity size with normal LV contractility and EF 65%..   IMPRESSIONS:  1. Mild to moderate diagonal, circumflex and obtuse marginal disease.  2. Normal left ventricular systolic function.  3. Borderline significant innominate artery stenosis.   RECOMMENDATION:  Upper extremity and carotid Doppler evaluation.

## 2014-11-15 NOTE — Progress Notes (Signed)
.  Patient: Michael [name] / Admit Date: 11/13/2014 / Date of Encounter: 11/15/2014, 8:04 AM   Subjective: Patient reports he feels better this morning. He denies chest pain, palpitations, shortness of breath, abdominal pain, nausea, vomiting.    Objective: Telemetry: Maintaining SR s/p spontaneous conversion yesterday, rate 55-60 Physical Exam: Blood pressure 149/59, pulse 63, temperature 97.8 F (36.6 C), temperature source Oral, resp. rate 14, height 5\' 11"  (1.803 m), weight 239 lb 12.8 oz (108.773 kg), SpO2 100 %. General: Well developed, well nourished WM, in no acute distress. Lying flat in bed Head: Normocephalic, atraumatic, sclera non-icteric, nares are without discharge. Neck:  JVP not elevated. Lungs: Clear bilaterally to auscultation without wheezes, rales, or rhonchi. Breathing is unlabored. Heart: RRR S1 S2 with systolic murmur. Abdomen: Soft, non-tender, non-distended with normoactive bowel sounds. No rebound/guarding. Extremities: No clubbing or cyanosis. No edema. Distal pedal pulses are 2+ and equal bilaterally. Neuro: Alert and oriented X 3. Moves all extremities spontaneously. Psych: Responds to questions appropriately with a normal affect.  No intake or output data in the 24 hours ending 11/15/14 0804  Inpatient Medications:  . allopurinol  200 mg Oral q morning - 10a  . amiodarone  400 mg Oral Daily  . aspirin EC  81 mg Oral Daily  . cholecalciferol  1,000 Units Oral Daily  . clobetasol cream  1 application Topical BID  . diltiazem  180 mg Oral Daily  . ferrous sulfate  325 mg Oral TID WC  . gemfibrozil  600 mg Oral BID AC  . glipiZIDE  10 mg Oral q morning - 10a  . insulin aspart  0-15 Units Subcutaneous TID WC  . levothyroxine  150 mcg Oral QAC breakfast  . lisinopril  20 mg Oral q morning - 10a  . metoprolol tartrate  50 mg Oral BID  . pantoprazole  40 mg Oral Daily   Infusions:  . sodium chloride 1 mL/kg/hr (11/15/14 0422)  . heparin 1,700 Units/hr  (11/14/14 1544)    Labs:  Recent Labs  11/13/14 0704  NA 136  K 3.9  CL 102  CO2 22  GLUCOSE 393*  BUN 22  CREATININE 0.94  CALCIUM 8.7    Recent Labs  11/13/14 1513  AST 24  ALT 15  ALKPHOS 61  BILITOT 0.6  PROT 5.8*  ALBUMIN 3.4*    Recent Labs  11/14/14 0800 11/15/14 0602  WBC 4.9 3.9*  HGB 10.3* 10.0*  HCT 31.0* 30.7*  MCV 84.9 85.5  PLT 157 140*    Recent Labs  11/13/14 1513 11/13/14 1941 11/14/14 0810 11/14/14 1154  TROPONINI 0.84* 1.86* 1.14* 1.02*    Radiology/Studies:  Dg Chest Port 1 View  11/13/2014   CLINICAL DATA:  Chest pain and short of breath  EXAM: PORTABLE CHEST - 1 VIEW  COMPARISON:  10/11/2014  FINDINGS: Pulmonary vascular congestion with probable interstitial edema. Findings suggest fluid overload. No pleural effusion.  Hypoventilation with mild bibasilar atelectasis.  IMPRESSION: Pulmonary vascular congestion, possible mild interstitial edema  Mild bibasilar atelectasis.   Electronically Signed   By: Franchot Gallo M.D.   On: 11/13/2014 07:41     Assessment and Plan  74 yo male with history of HTN, HLD, DM, PAF, hypothyroidism, and IDA s/p proximal small bowel AVM. Diagnosed with AF in 09/2014, spont converted to NSR, placed on Eliquis. Readmitted 04/19 for afib with RVR after missing 1 dose of diltiazem. Started on IV amiodarone -> oral and spontaneously converted to SR. Initial troponin  0.02, subsequent rise to 0.84, latest 1.86. Eliquis held 4/19, heparin started. Last echo 03/18 EF 55-60%, mild LVH.   1. Paroxysmal atrial fibrillation with RVR - Now s/p spontaneous cardioversion, maintaining SR, HR/BP stable - Continue amiodarone 400 mg PO daily (new this admission), diltiazem 180 mg daily, metoprolol 50 mg BID - Continue heparin per pharmacy pending decision regarding ischemic workup  2. Elevated troponin - Troponin 0.02, 0.84, 1.86 - higher than expected with AF RVR although this did last quite a while. Stress test 09/2014  without ischemia or infarction, EF 49% with septal hypokinesia but normal WM/EF on echo. Cath today. BMP pending. - last dose of Eliquis was on 4/19 at 12:43pm. - Echo 03/18 EF 55-60%, mild LVH   3. HTN - follow on current regimen. Schedule to add Lisinopril 20mg  today at [id number]. Systolic BPs in 0000000 range.   4. IDA with history of small bowel AVM, no reported bleeding on anticoagulation - Hemoglobin stable (10.0, around baseline for patient even before Eliquis). Fecal occult blood is negative.  - Continue iron supplementation   5. DM 6. Hypothyroidism, on levothyroxine 7. HLD  Signed, Bhagat,Bhavinkumar, PA-C [phone]   Personally seen and examined. Agree with above. RRR now (prior AFIB) Cath today On Hep IV Will restart Eliquis post cath Amio 400 for AFIB Rhythm control If stable post cath, possible DC tomorrow  Candee Furbish, MD

## 2014-11-15 NOTE — H&P (View-Only) (Signed)
.  Patient: Michael [name] / Admit Date: 11/13/2014 / Date of Encounter: 11/15/2014, 8:04 AM   Subjective: Patient reports he feels better this morning. He denies chest pain, palpitations, shortness of breath, abdominal pain, nausea, vomiting.    Objective: Telemetry: Maintaining SR s/p spontaneous conversion yesterday, rate 55-60 Physical Exam: Blood pressure 149/59, pulse 63, temperature 97.8 F (36.6 C), temperature source Oral, resp. rate 14, height 5\' 11"  (1.803 m), weight 239 lb 12.8 oz (108.773 kg), SpO2 100 %. General: Well developed, well nourished WM, in no acute distress. Lying flat in bed Head: Normocephalic, atraumatic, sclera non-icteric, nares are without discharge. Neck:  JVP not elevated. Lungs: Clear bilaterally to auscultation without wheezes, rales, or rhonchi. Breathing is unlabored. Heart: RRR S1 S2 with systolic murmur. Abdomen: Soft, non-tender, non-distended with normoactive bowel sounds. No rebound/guarding. Extremities: No clubbing or cyanosis. No edema. Distal pedal pulses are 2+ and equal bilaterally. Neuro: Alert and oriented X 3. Moves all extremities spontaneously. Psych: Responds to questions appropriately with a normal affect.  No intake or output data in the 24 hours ending 11/15/14 0804  Inpatient Medications:  . allopurinol  200 mg Oral q morning - 10a  . amiodarone  400 mg Oral Daily  . aspirin EC  81 mg Oral Daily  . cholecalciferol  1,000 Units Oral Daily  . clobetasol cream  1 application Topical BID  . diltiazem  180 mg Oral Daily  . ferrous sulfate  325 mg Oral TID WC  . gemfibrozil  600 mg Oral BID AC  . glipiZIDE  10 mg Oral q morning - 10a  . insulin aspart  0-15 Units Subcutaneous TID WC  . levothyroxine  150 mcg Oral QAC breakfast  . lisinopril  20 mg Oral q morning - 10a  . metoprolol tartrate  50 mg Oral BID  . pantoprazole  40 mg Oral Daily   Infusions:  . sodium chloride 1 mL/kg/hr (11/15/14 0422)  . heparin 1,700 Units/hr  (11/14/14 1544)    Labs:  Recent Labs  11/13/14 0704  NA 136  K 3.9  CL 102  CO2 22  GLUCOSE 393*  BUN 22  CREATININE 0.94  CALCIUM 8.7    Recent Labs  11/13/14 1513  AST 24  ALT 15  ALKPHOS 61  BILITOT 0.6  PROT 5.8*  ALBUMIN 3.4*    Recent Labs  11/14/14 0800 11/15/14 0602  WBC 4.9 3.9*  HGB 10.3* 10.0*  HCT 31.0* 30.7*  MCV 84.9 85.5  PLT 157 140*    Recent Labs  11/13/14 1513 11/13/14 1941 11/14/14 0810 11/14/14 1154  TROPONINI 0.84* 1.86* 1.14* 1.02*    Radiology/Studies:  Dg Chest Port 1 View  11/13/2014   CLINICAL DATA:  Chest pain and short of breath  EXAM: PORTABLE CHEST - 1 VIEW  COMPARISON:  10/11/2014  FINDINGS: Pulmonary vascular congestion with probable interstitial edema. Findings suggest fluid overload. No pleural effusion.  Hypoventilation with mild bibasilar atelectasis.  IMPRESSION: Pulmonary vascular congestion, possible mild interstitial edema  Mild bibasilar atelectasis.   Electronically Signed   By: Franchot Gallo M.D.   On: 11/13/2014 07:41     Assessment and Plan  74 yo male with history of HTN, HLD, DM, PAF, hypothyroidism, and IDA s/p proximal small bowel AVM. Diagnosed with AF in 09/2014, spont converted to NSR, placed on Eliquis. Readmitted 04/19 for afib with RVR after missing 1 dose of diltiazem. Started on IV amiodarone -> oral and spontaneously converted to SR. Initial troponin  0.02, subsequent rise to 0.84, latest 1.86. Eliquis held 4/19, heparin started. Last echo 03/18 EF 55-60%, mild LVH.   1. Paroxysmal atrial fibrillation with RVR - Now s/p spontaneous cardioversion, maintaining SR, HR/BP stable - Continue amiodarone 400 mg PO daily (new this admission), diltiazem 180 mg daily, metoprolol 50 mg BID - Continue heparin per pharmacy pending decision regarding ischemic workup  2. Elevated troponin - Troponin 0.02, 0.84, 1.86 - higher than expected with AF RVR although this did last quite a while. Stress test 09/2014  without ischemia or infarction, EF 49% with septal hypokinesia but normal WM/EF on echo. Cath today. BMP pending. - last dose of Eliquis was on 4/19 at 12:43pm. - Echo 03/18 EF 55-60%, mild LVH   3. HTN - follow on current regimen. Schedule to add Lisinopril 20mg  today at [id number]. Systolic BPs in 0000000 range.   4. IDA with history of small bowel AVM, no reported bleeding on anticoagulation - Hemoglobin stable (10.0, around baseline for patient even before Eliquis). Fecal occult blood is negative.  - Continue iron supplementation   5. DM 6. Hypothyroidism, on levothyroxine 7. HLD  Signed, Bhagat,Bhavinkumar, PA-C [phone]   Personally seen and examined. Agree with above. RRR now (prior AFIB) Cath today On Hep IV Will restart Eliquis post cath Amio 400 for AFIB Rhythm control If stable post cath, possible DC tomorrow  Candee Furbish, MD

## 2014-11-15 NOTE — Interval H&P Note (Signed)
Cath Lab Visit (complete for each Cath Lab visit)  Clinical Evaluation Leading to the Procedure:   ACS: Yes.    Non-ACS:    Anginal Classification: No Symptoms  Anti-ischemic medical therapy: Maximal Therapy (2 or more classes of medications)  Non-Invasive Test Results: No non-invasive testing performed  Prior CABG: No previous CABG      History and Physical Interval Note:  11/15/2014 1:10 PM  Michael [name]  has presented today for surgery, with the diagnosis of cp  The various methods of treatment have been discussed with the patient and family. After consideration of risks, benefits and other options for treatment, the patient has consented to  Procedure(s): LEFT HEART CATHETERIZATION WITH CORONARY ANGIOGRAM (N/A) as a surgical intervention .  The patient's history has been reviewed, patient examined, no change in status, stable for surgery.  I have reviewed the patient's chart and labs.  Questions were answered to the patient's satisfaction.     Sinclair Grooms

## 2014-11-15 NOTE — Progress Notes (Signed)
Offered Pt assistance with bath, Pt declined bath supplies given to Pt

## 2014-11-15 NOTE — Progress Notes (Signed)
[place] for heparin Indication: chest pain/ACS  Allergies  Allergen Reactions  . Percocet [Oxycodone-Acetaminophen] Nausea And Vomiting    Labs:  Recent Labs  11/13/14 0704  11/13/14 1941 11/14/14 0800 11/14/14 0810 11/14/14 1154 11/14/14 1642 11/15/14 0602  HGB 11.4*  --   --  10.3*  --   --   --  10.0*  HCT 33.4*  --   --  31.0*  --   --   --  30.7*  PLT 184  --   --  157  --   --   --  140*  APTT  --   --   --   --  45*  --  48*  --   LABPROT 14.9  --   --   --   --   --   --   --   INR 1.15  --   --   --   --   --   --   --   HEPARINUNFRC  --   --   --   --  0.97*  --  0.66 0.46  CREATININE 0.94  --   --   --   --   --   --  1.10  TROPONINI  --   < > 1.86*  --  1.14* 1.02*  --   --   < > = values in this interval not displayed.  Estimated Creatinine Clearance: 76.2 mL/min (by C-G formula based on Cr of 1.1).   Assessment: 74 year old man to start on IV heparin for r/o ACS.  Troponins are trending up.  Mr. Show was on apixaban prior to admission.  His last dose was at 1243 today.  Apixaban now on hold and heparin infusion to start.  Heparin levels will be elevated due to apixaban exposure, so aPTTs will be used to monitor anticoagulation effects.  HL therapeutic  Goal of Therapy:  Heparin level = 0.3 to 0.7 Monitor platelets by anticoagulation protocol: Yes   Plan:  Continue heparin at 1700 units / hr Follow up after cath -- for Eliquis resumption  Thank you. Anette Guarneri, PharmD [phone] 11/15/2014,10:02 AM

## 2014-11-16 ENCOUNTER — Encounter (HOSPITAL_COMMUNITY): Payer: Self-pay | Admitting: Interventional Cardiology

## 2014-11-16 DIAGNOSIS — I248 Other forms of acute ischemic heart disease: Secondary | ICD-10-CM

## 2014-11-16 DIAGNOSIS — I6523 Occlusion and stenosis of bilateral carotid arteries: Secondary | ICD-10-CM

## 2014-11-16 LAB — BASIC METABOLIC PANEL
Anion gap: 8 (ref 5–15)
BUN: 11 mg/dL (ref 6–23)
CO2: 26 mmol/L (ref 19–32)
Calcium: 8.2 mg/dL — ABNORMAL LOW (ref 8.4–10.5)
Chloride: 106 mmol/L (ref 96–112)
Creatinine, Ser: 1.01 mg/dL (ref 0.50–1.35)
GFR calc non Af Amer: 72 mL/min — ABNORMAL LOW (ref >90)
GFR, EST AFRICAN AMERICAN: 84 mL/min — AB (ref >90)
GLUCOSE: 191 mg/dL — AB (ref 70–99)
Potassium: 3.8 mmol/L (ref 3.5–5.1)
Sodium: 140 mmol/L (ref 135–145)

## 2014-11-16 LAB — CBC
HCT: 30.2 % — ABNORMAL LOW (ref 39.0–52.0)
HEMOGLOBIN: 9.9 g/dL — AB (ref 13.0–17.0)
MCH: 27.9 pg (ref 26.0–34.0)
MCHC: 32.8 g/dL (ref 30.0–36.0)
MCV: 85.1 fL (ref 78.0–100.0)
Platelets: 134 10*3/uL — ABNORMAL LOW (ref 150–400)
RBC: 3.55 MIL/uL — AB (ref 4.22–5.81)
RDW: 16.6 % — ABNORMAL HIGH (ref 11.5–15.5)
WBC: 4.5 10*3/uL (ref 4.0–10.5)

## 2014-11-16 LAB — GLUCOSE, CAPILLARY
GLUCOSE-CAPILLARY: 183 mg/dL — AB (ref 70–99)
Glucose-Capillary: 164 mg/dL — ABNORMAL HIGH (ref 70–99)

## 2014-11-16 LAB — HEPARIN LEVEL (UNFRACTIONATED): HEPARIN UNFRACTIONATED: 0.22 [IU]/mL — AB (ref 0.30–0.70)

## 2014-11-16 MED ORDER — AMIODARONE HCL 200 MG PO TABS: 200.0000 mg | ORAL_TABLET | Freq: Every day | ORAL | Status: DC

## 2014-11-16 MED ORDER — AMIODARONE HCL 200 MG PO TABS
200.0000 mg | ORAL_TABLET | Freq: Every day | ORAL | Status: DC
  Administered 2014-11-16: 200 mg via ORAL
  Filled 2014-11-16: qty 1

## 2014-11-16 MED ORDER — APIXABAN 5 MG PO TABS
5.0000 mg | ORAL_TABLET | Freq: Two times a day (BID) | ORAL | Status: DC
  Administered 2014-11-16: 5 mg via ORAL
  Filled 2014-11-16: qty 1

## 2014-11-16 MED ORDER — ASPIRIN 81 MG PO TBEC: 81.0000 mg | DELAYED_RELEASE_TABLET | Freq: Every day | ORAL | Status: DC

## 2014-11-16 MED ORDER — DILTIAZEM HCL ER COATED BEADS 180 MG PO CP24: 180.0000 mg | ORAL_CAPSULE | Freq: Every day | ORAL | Status: DC

## 2014-11-16 NOTE — Discharge Summary (Signed)
Discharge Summary   Patient ID: ANTAVIUS SPERBECK,  MRN: [id number], DOB/AGE: 02-Dec-1940 74 y.o.  Admit date: 11/13/2014 Discharge date: 11/16/2014  Primary Care Provider: [name] Primary Cardiologist: Vaughan Browner, MD   Discharge Diagnoses Principal Problem:   Atrial fibrillation with RVR Active Problems:   DM (diabetes mellitus)   HTN (hypertension), benign   Iron deficiency anemia due to chronic blood loss   Obesity   Hypertriglyceridemia   Psoriatic arthritis   Paroxysmal a-fib   Demand ischemia   Allergies Allergies  Allergen Reactions  . Percocet [Oxycodone-Acetaminophen] Nausea And Vomiting    Procedures  Cath 11/15/14 ANGIOGRAPHIC DATA: The left main coronary artery is widely patent.  The left anterior descending artery is widely patent and wraps around the left ventricular apex. The first diagonal contains 50-70% narrowing in its dominant branch.. The left circumflex artery is patent. There is 2 obtuse marginal branches. The second obtuse marginal contains proximal eccentric 50-70% narrowing. The mid circumflex after the origin of the second obtuse marginal contains 50% narrowing.. The ramus intermedius is small and widely patent. The right coronary artery is dominant and widely patent..  IMPRESSIONS:  1. Mild to moderate diagonal, circumflex and obtuse marginal disease. 2. Normal left ventricular systolic function. 3. Borderline significant innominate artery stenosis.  RECOMMENDATION: Upper extremity and carotid Doppler evaluation.  History of Present Illness  JAAN FISCHEL is a 74 year old male with past medical history of HTN, HLD, DM, PAF, hypothyroidism, history of iron deficiency anemia as result of proximal small bowel AVM who presented to ED 11/13/14 for afib with RVR.   He was recently admitted 10/11/14 to [facility] secondary to atrial fibrillation with rapid ventricular response. In that setting, he did have mild elevation of troponin. He  was placed on IV diltiazem and subsequently converted spontaneously to sinus rhythm. Stress test 09/2014 without ischemia or infarction, EF 49% with septal hypokinesia but normal WM/EF on echo. He was also placed on eliquis in the setting of a CHA2DS2VASc of 3. He was subsequently discharged. Since discharge, he has done well. He was maintaining sinus rhythm during his last clinic visit 11/07/14 and diltiazem was increased to 160m daily. He reported an occasional palpitation lasting 1 or 2 seconds since discharge but nothing sustained. Patient has missed does of PO Cardizem on 4/18. He does not remembered if he had increased does of diltiazem. He did not missed any other medications. He was in his USOH prior to 3am 4/19 when he woke up with sick stomach. He had a few small brown vomite and then he developed dyspnea, weakness, substernal chest pressure. No radiation of pain. EMS reported one episode of vomiting with bile appearance while enroute. He received 3284mASA, 1SL nitro, 1029mardizem bolus and 53m36mrdizem infusion 4 mg zofran by EMS. POC trop is negative. INR 1.15. In ED, his chest pain was 4/10, initially it was 8/10. He was unable to describe type of chest pain. He denied dizziness, syncope, edema, early satiety, abd pain, bright red blood per rectum, melena, hematemesis, or change in vision. He was drinking one cup of caffeinated coffee everyday.  - His HR was poorly controlled despite IV diltiazem in ED. He was admitted 4/19 with plan to cardioversion next day. No ischemic workup at that time as POC was neg.   Hospital Course  1. Paroxysmal atrial fibrillation with RVR -- Pt was admitted 4/19 with plan to cardioversion next day. Amio drip was started to control rhythm since he was quite  symptomatic when he goes into a.Fib. One does of IV Lasix was given on admission as chest x ray showed pulmonary vascular congestion, possible mild interstitial edema. He spontaneously converted to sinus brady  @ 1513 4/19. Cardioversion cancelled. TSH was normal. Amio and Cardizem gtt was discontinued and switched to amio 480m po qd and dilt 1864mdaily. Patient has maintained SR since then. Eliquis was held and IV heparin started 4/19 in a setting of elevated troponin. Eliquis resumed and amiodarone decreased to 20047md 11/16/14. He is discharged stably NSR  on miodarone to 200m62m daily, diltiazem 180 mg daily, metoprolol 50 mg BID and Eliquis 5mg 52m.   2. Elevated troponin - Initial point-of-care troponin was 0.02. However, a subsequent troponin was elevated at 0.84 and then to 1.84. Mr. JonesHlavacnot had chest pain or associated symptoms. Eliquis was held and IV heparin started at night of 4/19. He was schedule for cath due to higher than expected elevated trop with AF AVR. Cath 4/21 showed mild to moderate diagonal, circumflex and obtuse marginal disease, normal left ventricular systolic function and borderline significant innominate artery stenosis. 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 Pending detailed result.    3. HTN - Pt has a mild elevated systolic BPs. Started home Lisinopril 20mg 92my 4/19. Systolic BPs in [id number] since then.   Discharge Vitals Blood pressure 142/53, pulse 66, temperature 97.7 F (36.5 C), temperature source Oral, resp. rate 14, height 5' 11"  (1.803 m), weight 240 lb 4.8 oz (108.999 kg), SpO2 98 %.  Filed Weights   11/14/14 0404 11/15/14 0412 11/16/14 0419  Weight: 239 lb 12.2 oz (108.755 kg) 239 lb 12.8 oz (108.773 kg) 240 lb 4.8 oz (108.999 kg)    CBC  Recent Labs  11/15/14 0602 11/16/14 0637  WBC 3.9* 4.5  HGB 10.0* 9.9*  HCT 30.7* 30.2*  MCV 85.5 85.1  PLT 140* 134*  852sic Metabolic Panel  Recent Labs  11/15/14 0602 11/16/14 0637  NA 140 140  K 3.9 3.8  CL 104 106  CO2 25 26  GLUCOSE 232* 191*  BUN 17 11  CREATININE 1.10 1.01  CALCIUM  8.3* 8.2*   Liver Function Tests  Recent Labs  11/13/14 1513  AST 24  ALT 15  ALKPHOS 61  BILITOT 0.6  PROT 5.8*  ALBUMIN 3.4*   Cardiac Enzymes  Recent Labs  11/13/14 1941 11/14/14 0810 11/14/14 1154  TROPONINI 1.86* 1.14* 1.02*    Recent Labs  11/13/14 1513  TSH 1.442    Disposition  Pt is being discharged home today in good condition. Please review Carotid Duplex and Upper extremity arterial duplex result with patient during office visit.  Follow-up Plans & Appointments      Follow-up Information    Follow up with Scott Richardson Dopp On 12/10/2014.   Specialty:  Physician Assistant   Why:  @3 :20 post hospital   Contact information:   [address] [phone] Discharge Medications    Medication List    TAKE these medications        acetaminophen 650 MG CR tablet  Commonly known as:  TYLENOL  Take 1,300 mg by mouth every 8 (eight) hours as needed for pain.     allopurinol 100 MG tablet  Commonly known as:  ZYLOPRIM  Take 200 mg by mouth every morning.  amiodarone 200 MG tablet  Commonly known as:  PACERONE  Take 1 tablet (200 mg total) by mouth daily.     apixaban 5 MG Tabs tablet  Commonly known as:  ELIQUIS  Take 1 tablet (5 mg total) by mouth 2 (two) times daily.     aspirin 81 MG EC tablet  Take 1 tablet (81 mg total) by mouth daily.     Cholecalciferol 1000 UNITS Tbdp  Take 1,000 Units by mouth daily.     clobetasol 0.05 % external solution  Commonly known as:  TEMOVATE  Apply 1 application topically 2 (two) times daily. Apply to scalp     diltiazem 180 MG 24 hr capsule  Commonly known as:  CARDIZEM CD  Take 1 capsule (180 mg total) by mouth daily.     ergocalciferol 50000 UNITS capsule  Commonly known as:  VITAMIN D2  Take 1 capsule (50,000 Units total) by mouth once a week.     ferrous sulfate 325 (65 FE) MG tablet  Take 325 mg by mouth 3 (three) times daily with meals.       gemfibrozil 600 MG tablet  Commonly known as:  LOPID  Take 600 mg by mouth 2 (two) times daily before a meal.     glipiZIDE 10 MG 24 hr tablet  Commonly known as:  GLUCOTROL XL  Take 1 tablet (10 mg total) by mouth every morning.     glucose blood test strip  Commonly known as:  ONE TOUCH ULTRA TEST  Use as instructed     levothyroxine 150 MCG tablet  Commonly known as:  SYNTHROID, LEVOTHROID  Take 1 tablet (150 mcg total) by mouth daily before breakfast.     lisinopril 20 MG tablet  Commonly known as:  PRINIVIL,ZESTRIL  Take 1 tablet (20 mg total) by mouth every morning.     metoprolol 50 MG tablet  Commonly known as:  LOPRESSOR  Take 1 tablet (50 mg total) by mouth 2 (two) times daily.     omeprazole 40 MG capsule  Commonly known as:  PRILOSEC  Take 1 capsule (40 mg total) by mouth daily.     ONE TOUCH ULTRA 2 W/DEVICE Kit  - Use as directed  - Dx E11.9     onetouch ultrasoft lancets  Use to check blood sugars daily     predniSONE 5 MG tablet  Commonly known as:  DELTASONE  Take 5 mg by mouth daily as needed (hand swelling).        Outstanding Labs/Studies  TSH & LFT as he stated on amiodorone on this admission.   Duration of Discharge Encounter   Greater than 30 minutes including physician time.  Signed, Dewie Ahart PA-C 11/16/2014, 3:05 PM

## 2014-11-16 NOTE — Progress Notes (Signed)
*  PRELIMINARY RESULTS* Vascular Ultrasound Carotid Duplex has been completed.  Bilaterally 40-59% ICA stenosis. 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 Left vertebral is patent with antegrade flow.   Upper extremity arterial duplex has been completed. 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 LEFT= Left upper extremity is within normal limits.   Landry Mellow, RDMS, RVT  11/16/2014, 2:25 PM

## 2014-11-16 NOTE — Discharge Instructions (Signed)
Radial Site Care Refer to this sheet in the next few weeks. These instructions provide you with information on caring for yourself after your procedure. Your caregiver may also give you more specific instructions. Your treatment has been planned according to current medical practices, but problems sometimes occur. Call your caregiver if you have any problems or questions after your procedure. HOME CARE INSTRUCTIONS  You may shower the day after the procedure.Remove the bandage (dressing) and gently wash the site with plain soap and water.Gently pat the site dry.  Do not apply powder or lotion to the site.  Do not submerge the affected site in water for 3 to 5 days.  Inspect the site at least twice daily.  Do not flex or bend the affected arm for 24 hours.  No lifting over 5 pounds (2.3 kg) for 5 days after your procedure.  Do not drive home if you are discharged the same day of the procedure. Have someone else drive you.  You may drive 24 hours after the procedure unless otherwise instructed by your caregiver.  Do not operate machinery or power tools for 24 hours.  A responsible adult should be with you for the first 24 hours after you arrive home. What to expect:  Any bruising will usually fade within 1 to 2 weeks.  Blood that collects in the tissue (hematoma) may be painful to the touch. It should usually decrease in size and tenderness within 1 to 2 weeks. SEEK IMMEDIATE MEDICAL CARE IF:  You have unusual pain at the radial site.  You have redness, warmth, swelling, or pain at the radial site.  You have drainage (other than a small amount of blood on the dressing).  You have chills.  You have a fever or persistent symptoms for more than 72 hours.  You have a fever and your symptoms suddenly get worse.  Your arm becomes pale, cool, tingly, or numb.  You have heavy bleeding from the site. Hold pressure on the site. Document Released: 08/15/2010 Document Revised:  10/05/2011 Document Reviewed: 08/15/2010 [facility] Patient Information 2015 [place], [place]. This information is not intended to replace advice given to you by your health care provider. Make sure you discuss any questions you have with your health care provider.     Do not drink alcohol.  Do not drink caffeinated beverages such as coffee, soda, and some teas. You may drink decaffeinated coffee, soda, or tea. As This can Cause A. Fib.

## 2014-11-16 NOTE — Progress Notes (Addendum)
.  Patient: Michael [name] / Admit Date: 11/13/2014 / Date of Encounter: 11/16/2014, 7:54 AM   Subjective: He denies chest pain, palpitations, shortness of breath, abdominal pain, nausea or vomiting. Reports he had carotid Duplex previously with moderate disease.    Objective: Telemetry: Maintaining SR s/p spontaneous conversion yesterday, rate 55-60 Physical Exam: Blood pressure 153/77, pulse 63, temperature 97.7 F (36.5 C), temperature source Oral, resp. rate 14, height 5\' 11"  (1.803 m), weight 240 lb 4.8 oz (108.999 kg), SpO2 98 %. General: Well developed, well nourished WM, in no acute distress. Sitting in a chair. Head: Normocephalic, atraumatic, sclera non-icteric, nares are without discharge. Neck:  JVP not elevated. Lungs: Clear bilaterally to auscultation without wheezes, rales, or rhonchi. Breathing is unlabored. Heart: RRR S1 S2 with systolic murmur. Abdomen: Soft, non-tender, non-distended with normoactive bowel sounds. No rebound/guarding. Extremities: No clubbing or cyanosis. No edema. Distal pedal pulses are 2+ and equal bilaterally. Right wrist cath site without erythema or edema.   Neuro: Alert and oriented X 3. Moves all extremities spontaneously. Psych: Responds to questions appropriately with a normal affect.   Intake/Output Summary (Last 24 hours) at 11/16/14 0754 Last data filed at 11/16/14 0600  Gross per 24 hour  Intake    871 ml  Output      0 ml  Net    871 ml    Inpatient Medications:  . allopurinol  200 mg Oral q morning - 10a  . amiodarone  400 mg Oral Daily  . aspirin EC  81 mg Oral Daily  . cholecalciferol  1,000 Units Oral Daily  . clobetasol cream  1 application Topical BID  . diltiazem  180 mg Oral Daily  . ferrous sulfate  325 mg Oral TID WC  . gemfibrozil  600 mg Oral BID AC  . glipiZIDE  10 mg Oral q morning - 10a  . insulin aspart  0-15 Units Subcutaneous TID WC  . levothyroxine  150 mcg Oral QAC breakfast  . lisinopril  20 mg Oral q  morning - 10a  . metoprolol tartrate  50 mg Oral BID  . pantoprazole  40 mg Oral Daily   Infusions:  . heparin 1,700 Units/hr (11/16/14 0400)    Labs:  Recent Labs  11/15/14 0602 11/16/14 0637  NA 140 140  K 3.9 3.8  CL 104 106  CO2 25 26  GLUCOSE 232* 191*  BUN 17 11  CREATININE 1.10 1.01  CALCIUM 8.3* 8.2*    Recent Labs  11/13/14 1513  AST 24  ALT 15  ALKPHOS 61  BILITOT 0.6  PROT 5.8*  ALBUMIN 3.4*    Recent Labs  11/15/14 0602 11/16/14 0637  WBC 3.9* 4.5  HGB 10.0* 9.9*  HCT 30.7* 30.2*  MCV 85.5 85.1  PLT 140* 134*    Recent Labs  11/13/14 1513 11/13/14 1941 11/14/14 0810 11/14/14 1154  TROPONINI 0.84* 1.86* 1.14* 1.02*    Radiology/Studies:   Carotid Doppler 03/30/2014 Heterogeneous plaque, bilaterally. 40-59% RICA stenosis. [id number] LICA stenosis. Right vertebral artery has to and fro flow. Left vertebral artery is patent with antegrade flow. Normal subclavian arteries. Bilaterally. F/u in 6 months.   Cath 11/15/14 IMPRESSIONS:  1. Mild to moderate diagonal, circumflex and obtuse marginal disease. 2. Normal left ventricular systolic function. 3. Borderline significant innominate artery stenosis. RECOMMENDATION: Upper extremity and carotid Doppler evaluation.    Assessment and Plan  74 yo male with history of HTN, HLD, DM, PAF, hypothyroidism, and IDA s/p proximal small  bowel AVM. Diagnosed with AF in 09/2014, spont converted to NSR, placed on Eliquis. Readmitted 04/19 for afib with RVR after missing 1 dose of diltiazem. Started on IV amiodarone -> oral and spontaneously converted to SR. Initial troponin 0.02, subsequent rise to 0.84, latest 1.86. Eliquis held 4/19, heparin started. Last echo 03/18 EF 55-60%, mild LVH.   1. Paroxysmal atrial fibrillation with RVR - Now s/p spontaneous cardioversion, maintaining SR, HR/BP stable - Will decrease amiodarone to 200mg  PO daily (new this admission), diltiazem 180 mg daily, metoprolol 50 mg  BID - Stop heparin. Retart Eliquis 5mg  BID.   2. Elevated troponin - Troponin 0.02, 0.84, 1.86 - higher than expected with AF RVR although this did last quite a while. Stress test 09/2014 without ischemia or infarction, EF 49% with septal hypokinesia but normal WM/EF on echo. Echo 03/18 EF 55-60%, mild LVH. - Cath 4/21 showed mild to moderate diagonal, circumflex and obtuse marginal disease, normal left ventricular systolic function and  borderline significant innominate artery stenosis. Will get upper extremity and carotid doppler today as per Dr. Tamala Julian. - Last carotid doppler (03/30/2013)  showed moderate narrowing (40-59% RICA stenosis, A999333 LICA stenosis) and recommended repeat doppler US is 6 months. He is due for study.  -- Last dose of Eliquis was on 4/19 at 12:43pm. Will restart Eliquis today.  3. HTN - follow on current regimen. Lisinopril 20mg  started yesterday. Systolic BPs in [id number] range.   4. IDA with history of small bowel AVM, no reported bleeding on anticoagulation - Hemoglobin stable (10.0, around baseline for patient even before Eliquis). Fecal occult blood is negative.  - Continue iron supplementation   5. DM 6. Hypothyroidism, on levothyroxine 7. HLD  Signed, Bhagat,Bhavinkumar, PA-C   Patient seen and examined. Agree with assessment and plan. Cath data reviewed. 50 - 70% Dx and Lcx OM2 stenosis; medical therapy. Maintaing NSR, back on Eliquis. Prob dc post Carotid/UE duplex imaging later today.   Troy Sine, MD, [facility] 11/16/2014 11:01 AM

## 2014-11-20 ENCOUNTER — Telehealth: Payer: Self-pay | Admitting: Internal Medicine

## 2014-11-20 NOTE — Telephone Encounter (Signed)
We do not have samples any more. Can you call the pt and let them know?

## 2014-11-20 NOTE — Telephone Encounter (Signed)
I don't have samples. Sorry! AP

## 2014-11-20 NOTE — Telephone Encounter (Signed)
Patient is wondering if you have any samples of apixaban (ELIQUIS) 5 MG TABS tablet [id number] for about 2 1/2 weeks to last until he gets paid. Please advise

## 2014-11-23 ENCOUNTER — Other Ambulatory Visit: Payer: Self-pay | Admitting: Cardiology

## 2014-11-23 ENCOUNTER — Telehealth: Payer: Self-pay | Admitting: Cardiology

## 2014-11-23 MED ORDER — FUROSEMIDE 20 MG PO TABS: 20.0000 mg | ORAL_TABLET | Freq: Every day | ORAL | Status: DC

## 2014-11-23 NOTE — Telephone Encounter (Signed)
Spoke with wife and patient has had a 5 pound weight gain in 2 days and shortness of breath Denies any swelling in feet and legs but clothes feel tight Blood pressure 151/72 HR 67 Discussed with Dr Meda Coffee (DOD) and will have patient add Lasix 20 mg daily and call back next week with update If worse call back sooner  Wife aware

## 2014-11-23 NOTE — Telephone Encounter (Signed)
New message     Wife calling   Pt c/o swelling: STAT is pt has developed SOB within 24 hours  1. How long have you been experiencing swelling? Started noticing today    2. Where is the swelling located? Stomach / chest area   3.  Are you currently taking a "fluid pill"?  No   4.  Are you currently SOB? Wife stating he is sob.    5.  Have you traveled recently? No.   Gain 5 lb in 2 days,.

## 2014-11-27 ENCOUNTER — Telehealth: Payer: Self-pay | Admitting: Cardiology

## 2014-11-27 NOTE — Telephone Encounter (Signed)
New message    Wife calling stating her husband was swelling the nurse at Dr. Mare Ferrari the nurse called in lasix.    C/O legs cramps & constipation.

## 2014-11-27 NOTE — Telephone Encounter (Signed)
Left message to call back on home number and cell number

## 2014-11-27 NOTE — Telephone Encounter (Signed)
Follow Up  Pt wife called back to follow up on previous message sent. States that the pt's legs are cramping pretty badly//sr

## 2014-11-27 NOTE — Telephone Encounter (Signed)
Spoke with wife and patients swelling/weight doing better He has not had any Lasix today secondary to constipation and legs cramps Reviewed with  Dr. Mare Ferrari and will have patient start K+ 20 meq daily and check labs next week at visit bmet (last K+ 3.8) Left message to call back

## 2014-11-28 ENCOUNTER — Encounter: Payer: Self-pay | Admitting: Internal Medicine

## 2014-11-28 ENCOUNTER — Ambulatory Visit (INDEPENDENT_AMBULATORY_CARE_PROVIDER_SITE_OTHER): Payer: Medicare Other | Admitting: Internal Medicine

## 2014-11-28 VITALS — BP 150/60 | HR 71 | Temp 97.5°F | Wt 241.0 lb

## 2014-11-28 DIAGNOSIS — B029 Zoster without complications: Secondary | ICD-10-CM | POA: Diagnosis not present

## 2014-11-28 DIAGNOSIS — I6523 Occlusion and stenosis of bilateral carotid arteries: Secondary | ICD-10-CM

## 2014-11-28 DIAGNOSIS — M79605 Pain in left leg: Secondary | ICD-10-CM | POA: Diagnosis not present

## 2014-11-28 MED ORDER — HYDROCODONE-ACETAMINOPHEN 5-325 MG PO TABS: 1.0000 | ORAL_TABLET | Freq: Four times a day (QID) | ORAL | Status: DC | PRN

## 2014-11-28 MED ORDER — VALACYCLOVIR HCL 1 G PO TABS: 1000.0000 mg | ORAL_TABLET | Freq: Three times a day (TID) | ORAL | Status: DC

## 2014-11-28 MED ORDER — TRIAMCINOLONE ACETONIDE 0.1 % EX CREA: 1.0000 "application " | TOPICAL_CREAM | Freq: Two times a day (BID) | CUTANEOUS | Status: DC

## 2014-11-28 NOTE — Telephone Encounter (Signed)
Spoke with wife this am and she stated patient is only experiencing the pain/cramping in one leg from knee up to hip Per wife that leg now has a "rash" on it that is painful  Pain did come prior to rash After further discussing with wife, patient will see PCP  Keep scheduled appointment  Per  Dr. Mare Ferrari ok to hold off on starting K+ at this time

## 2014-11-28 NOTE — Patient Instructions (Signed)

## 2014-11-28 NOTE — Assessment & Plan Note (Signed)
5/16 LLE extensive Valtrex x 10 d Kenalog topically Norco prn  Potential benefits of a short term opioids use as well as potential risks (i.e. addiction risk, apnea etc) and complications (i.e. Somnolence, constipation and others) were explained to the patient and were aknowledged.

## 2014-11-28 NOTE — Assessment & Plan Note (Signed)
Valtrex x 10 d Kenalog topically Norco prn  Potential benefits of a short term opioids use as well as potential risks (i.e. addiction risk, apnea etc) and complications (i.e. Somnolence, constipation and others) were explained to the patient and were aknowledged.

## 2014-11-28 NOTE — Progress Notes (Signed)
Pre visit review using our clinic review tool, if applicable. No additional management support is needed unless otherwise documented below in the visit note. 

## 2014-11-28 NOTE — Progress Notes (Signed)
Subjective:     Rash This is a new problem. The current episode started yesterday. The affected locations include the left upper leg and left lower leg. The rash is characterized by blistering, burning and redness. He was exposed to nothing. Pertinent negatives include no anorexia, congestion, cough, diarrhea, fatigue or sore throat. Past treatments include nothing. There is no history of varicella.    F/u HTN, PVD, DM, hypothyroidism, Vit D def F/u occip HAs - better. BP is ok at home F/u R shoulder bl pain after putting up tree decorations - better  BP Readings from Last 3 Encounters:  11/28/14 150/60  11/16/14 142/53  11/07/14 170/68    Wt Readings from Last 3 Encounters:  11/28/14 241 lb (109.317 kg)  11/16/14 240 lb 4.8 oz (108.999 kg)  11/07/14 242 lb 9.6 oz (110.043 kg)     Review of Systems  Constitutional: Negative for appetite change, fatigue and unexpected weight change.  HENT: Negative for congestion, nosebleeds, sneezing, sore throat and trouble swallowing.   Eyes: Negative for itching and visual disturbance.  Respiratory: Negative for cough.   Cardiovascular: Negative for chest pain, palpitations and leg swelling.  Gastrointestinal: Negative for nausea, diarrhea, blood in stool, abdominal distention and anorexia.  Genitourinary: Negative for frequency and hematuria.  Musculoskeletal: Negative for back pain, joint swelling, gait problem and neck pain.  Skin: Positive for rash.  Neurological: Negative for dizziness, tremors, speech difficulty and weakness.  Psychiatric/Behavioral: Negative for sleep disturbance, dysphoric mood and agitation. The patient is not nervous/anxious.        Objective:   Physical Exam  Constitutional: He is oriented to person, place, and time. He appears well-developed. No distress.  NAD  HENT:  Mouth/Throat: Oropharynx is clear and moist.  Eyes: Conjunctivae are normal. Pupils are equal, round, and reactive to light.  Neck:  Normal range of motion. No JVD present. No thyromegaly present.  Cardiovascular: Normal rate, regular rhythm, normal heart sounds and intact distal pulses.  Exam reveals no gallop and no friction rub.   No murmur heard. Pulmonary/Chest: Effort normal and breath sounds normal. No respiratory distress. He has no wheezes. He has no rales. He exhibits no tenderness.  Abdominal: Soft. Bowel sounds are normal. He exhibits no distension and no mass. There is no tenderness. There is no rebound and no guarding.  Musculoskeletal: Normal range of motion. He exhibits no edema or tenderness.  Lymphadenopathy:    He has no cervical adenopathy.  Neurological: He is alert and oriented to person, place, and time. He has normal reflexes. No cranial nerve deficit. He exhibits normal muscle tone. He displays a negative Romberg sign. Coordination and gait normal.  No meningeal signs  Skin: Skin is warm and dry. Rash noted.  Psychiatric: He has a normal mood and affect. His behavior is normal. Judgment and thought content normal.  psoriatc patches - trunk, extr-s, scalp R sh blade is less tender in suprsinat area Extensive multiple eryth patches on the whole LLE w/clusters of vesicles  Lab Results  Component Value Date   WBC 4.5 11/16/2014   HGB 9.9* 11/16/2014   HCT 30.2* 11/16/2014   PLT 134* 11/16/2014   GLUCOSE 191* 11/16/2014   CHOL 165 10/12/2014   TRIG 792* 10/12/2014   HDL 22* 10/12/2014   LDLDIRECT 77.2 03/19/2014   LDLCALC UNABLE TO CALCULATE IF TRIGLYCERIDE OVER 400 mg/dL 10/12/2014   ALT 15 11/13/2014   AST 24 11/13/2014   NA 140 11/16/2014   K 3.8 11/16/2014  CL 106 11/16/2014   CREATININE 1.01 11/16/2014   BUN 11 11/16/2014   CO2 26 11/16/2014   TSH 1.442 11/13/2014   PSA 0.50 11/07/2013   INR 1.15 11/13/2014   HGBA1C 8.4* 10/12/2014   MICROALBUR 1.2 05/16/2013         Assessment & Plan:

## 2014-12-10 ENCOUNTER — Encounter: Payer: Self-pay | Admitting: Physician Assistant

## 2014-12-10 ENCOUNTER — Ambulatory Visit (INDEPENDENT_AMBULATORY_CARE_PROVIDER_SITE_OTHER): Payer: Medicare Other | Admitting: Physician Assistant

## 2014-12-10 VITALS — BP 156/66 | HR 63 | Ht 71.0 in | Wt 243.0 lb

## 2014-12-10 DIAGNOSIS — I10 Essential (primary) hypertension: Secondary | ICD-10-CM

## 2014-12-10 DIAGNOSIS — I480 Paroxysmal atrial fibrillation: Secondary | ICD-10-CM

## 2014-12-10 DIAGNOSIS — E785 Hyperlipidemia, unspecified: Secondary | ICD-10-CM

## 2014-12-10 DIAGNOSIS — I771 Stricture of artery: Secondary | ICD-10-CM

## 2014-12-10 DIAGNOSIS — I6523 Occlusion and stenosis of bilateral carotid arteries: Secondary | ICD-10-CM

## 2014-12-10 DIAGNOSIS — M79601 Pain in right arm: Secondary | ICD-10-CM

## 2014-12-10 DIAGNOSIS — Z79899 Other long term (current) drug therapy: Secondary | ICD-10-CM

## 2014-12-10 DIAGNOSIS — I5032 Chronic diastolic (congestive) heart failure: Secondary | ICD-10-CM

## 2014-12-10 DIAGNOSIS — Z8719 Personal history of other diseases of the digestive system: Secondary | ICD-10-CM

## 2014-12-10 NOTE — Progress Notes (Signed)
Cardiology Office Note   Date:  12/10/2014   ID:  Michael [name] [birth year], MRN [id number]  PCP:  Michael Kehr, MD  Cardiologist:  Dr. Darlin [name]    Chief Complaint  Patient presents with  . Coronary Artery Disease  . Atrial Fibrillation  . PAD     History of Present Illness: Michael [name] is a 74 y.o. male with a hx of PAF, HTN, HL, diabetes, prior GI bleed (2/2 small bowel AVM). He was admitted in March with atrial fibrillation with RVR. He converted to sinus rhythm with rate control therapy. He was placed on Eliquis for anticoagulation. He had elevated troponins. This was felt to be related to demand ischemia. Echocardiogram demonstrated normal LV function. Inpatient stress testing demonstrated no ischemia. He was seen in follow-up by Michael Bayley, NP 11/07/14.    Admitted 4/19-4/22 with chest pain in the setting of atrial fibrillation with RVR. He had evidence of vascular congestion and mild interstitial edema on chest x-ray and was given a dose of IV Lasix. He was also placed on amiodarone and converted to normal sinus rhythm. He again had elevated troponins.  Cardiac catheterization demonstrated mild to moderate diagonal, circumflex and OM disease, normal LV function and borderline significant innominate artery stenosis. The patient underwent carotid US and upper extremity arterial duplex. He has bilateral ICA stenosis 40-59%. There was evidence of more proximal obstruction in the right upper extremity based upon waveform dampening.  He returns for follow-up. He is here with his wife. Since discharge, he developed shingles on his left leg. The pain is improved. The rash is almost resolved. He denies chest discomfort or shortness of breath. He denies palpitations. He denies syncope. He does have occasional dizziness. This seems to occur when he's out at the store. He thinks it may be related to low sugars. He denies near syncope. He called in to our office recently with  increasing LE edema. He was placed on Lasix with improvement in his symptoms. He denies orthopnea or PND. He does report a history of bilateral hand injuries with chronic deformity. Over the last several months, he has started to develop right arm pain about his elbow and forearm. He also has occasional pallor. His hand will also become cold.   Studies/Reports Reviewed Today:  Carotid US 11/16/14 Bilat ICA 40-59% Right vertebral demonstrates "to-fro"; waveform, suggesting a more proximal obstruction. Left vertebral is patent with antegrade flow.  Upper Extremity Arterial Duplex 11/16/14 RUE demonstrates dampened waveforms suggestive of more proximal obstruction LUE waveforms WNL  LHC 11/15/14 LM:  widely patent. LAD:  widely patent; D1 50-70%  LCx:  Patent.  OM2 proximal eccentric 50-70%; mid CFX after origin of OM2 50% RI: widely patent. RCA:  widely patent.. INNOMINATE ANGIOGRAPHY:  There is heavy calcification with at least 50% obstruction in the innominate artery LV gram EF 65%..  Echo 10/12/14 - Mild LVH. EF 55% to 60%. - Aortic valve: Small systolic gradient without signficant stenosis. Valve area (VTI): 1.49 cm^2. Valve area (Vmax): 1.22 cm^2. Valve area (Vmean): 1.27 cm^2. - Left atrium: The atrium was mildly dilated. - Atrial septum: No defect or patent foramen ovale was identified.  Nuclear 10/13/14 IMPRESSION: 1. No reversible ischemia or infarction. 2. Septal hypokinesia. 3. Left ventricular ejection fraction 49% 4. Intermediate-risk stress test findings*.  (Based on EF)    Past Medical History  Diagnosis Date  . Hypertension   . Hyperlipidemia   . Gout   . Arthritis   .  Hypothyroidism   . Personal history of colonic polyps 2007, 2008    adenoma each time, largest 12 mm in 2007  . Iron deficiency anemia   . AVM (arteriovenous malformation) of colon   . Allergy   . H/O transfusion of whole blood   . PAF (paroxysmal atrial fibrillation)     a. 09/2014: Converted  on Dilt;  b. CHA2DS2VASc = 3-->eliquis;  c. 09/2014 Echo: EF 55-60%, mild LVH, mildly dil LA.  [name] Elevated troponin     a. 09/2014 in setting of AF RVR-->Myoview: EF 49%, no ischemia/infarct->Med Rx.  [name] Dysrhythmia     ATRIAL FIBRILATION  . Type II diabetes mellitus     TYPE 2  . GERD (gastroesophageal reflux disease)   . Psoriasis   . CAD (coronary artery disease)     a. Cath 10/2014 - Mild to moderate diagonal, circumflex and obtuse marginal disease. Innominate artery sternosis.  . Carotid artery disease     a. Carotid dopple 03/2014 15-83% RICA &  09-40% LICA stenosis b. 7/68    Past Surgical History  Procedure Laterality Date  . Rotator cuff repair      left  . Knee arthroscopy      right  . Colonoscopy w/ polypectomy  04/09/2006    12 mm adenoma  . Colonoscopy w/ polypectomy  06/17/2007    5 mm adenoma  . Colonoscopy  02/10/2011    internal hemorrhoids  . Esophagogastroduodenoscopy  12/23/2011    Procedure: ESOPHAGOGASTRODUODENOSCOPY (EGD);  Surgeon: Irene Shipper, MD;  Location: [facility];  Service: Endoscopy;  Laterality: N/A;  with small bowel bx's  . Givens capsule study  12/28/2011  . Laparoscopic appendectomy N/A 02/09/2014    Procedure: APPENDECTOMY LAPAROSCOPIC;  Surgeon: Leighton Ruff, MD;  Location: [facility];  Service: General;  Laterality: N/A;  . Appendectomy  02/09/2014  . Left heart catheterization with coronary angiogram N/A 11/15/2014    Procedure: LEFT HEART CATHETERIZATION WITH CORONARY ANGIOGRAM;  Surgeon: Belva Crome, MD;  Location: [name] CATH LAB;  Service: Cardiovascular;  Laterality: N/A;     Current Outpatient Prescriptions  Medication Sig Dispense Refill  . acetaminophen (TYLENOL) 650 MG CR tablet Take 1,300 mg by mouth every 8 (eight) hours as needed for pain.     [name] allopurinol (ZYLOPRIM) 100 MG tablet Take 200 mg by mouth every morning.     [name] amiodarone (PACERONE) 200 MG tablet Take 1 tablet (200 mg total) by mouth daily. 30 tablet 6  . apixaban (ELIQUIS)  5 MG TABS tablet Take 1 tablet (5 mg total) by mouth 2 (two) times daily. 60 tablet 10  . aspirin EC 81 MG EC tablet Take 1 tablet (81 mg total) by mouth daily. 30 tablet 11  . Blood Glucose Monitoring Suppl (ONE TOUCH ULTRA 2) W/DEVICE KIT Use as directed Dx E11.9 1 each 0  . Cholecalciferol 1000 UNITS TBDP Take 1,000 Units by mouth daily.    . clobetasol (TEMOVATE) 0.05 % external solution Apply 1 application topically 2 (two) times daily. Apply to scalp    . diltiazem (CARDIZEM CD) 180 MG 24 hr capsule Take 1 capsule (180 mg total) by mouth daily. 30 capsule 11  . ergocalciferol (VITAMIN D2) 50000 UNITS capsule Take 1 capsule (50,000 Units total) by mouth once a week. 6 capsule 0  . ferrous sulfate 325 (65 FE) MG tablet Take 325 mg by mouth 3 (three) times daily with meals.    . furosemide (LASIX) 20 MG tablet TAKE  1 TABLET(20 MG) BY MOUTH DAILY 90 tablet 0  . gemfibrozil (LOPID) 600 MG tablet Take 600 mg by mouth 2 (two) times daily before a meal.    . glipiZIDE (GLUCOTROL XL) 10 MG 24 hr tablet Take 1 tablet (10 mg total) by mouth every morning. 90 tablet 3  . glucose blood (ONE TOUCH ULTRA TEST) test strip Use as instructed 30 each 11  . HYDROcodone-acetaminophen (NORCO/VICODIN) 5-325 MG per tablet Take 1 tablet by mouth every 6 (six) hours as needed for moderate pain or severe pain. 90 tablet 0  . Lancets (ONETOUCH ULTRASOFT) lancets Use to check blood sugars daily 30 each 11  . levothyroxine (SYNTHROID, LEVOTHROID) 150 MCG tablet Take 1 tablet (150 mcg total) by mouth daily before breakfast. 90 tablet 2  . lisinopril (PRINIVIL,ZESTRIL) 20 MG tablet Take 1 tablet (20 mg total) by mouth every morning. 90 tablet 3  . metoprolol tartrate (LOPRESSOR) 50 MG tablet Take 1 tablet (50 mg total) by mouth 2 (two) times daily. 60 tablet 6  . omeprazole (PRILOSEC) 40 MG capsule Take 1 capsule (40 mg total) by mouth daily. 90 capsule 3  . predniSONE (DELTASONE) 5 MG tablet Take 5 mg by mouth daily as  needed (hand swelling).     . triamcinolone cream (KENALOG) 0.1 % Apply 1 application topically 2 (two) times daily. 80 g 3  . valACYclovir (VALTREX) 1000 MG tablet Take 1 tablet (1,000 mg total) by mouth 3 (three) times daily. 30 tablet 0   No current facility-administered medications for this visit.    Allergies:   Percocet    Social History:  The patient  reports that he quit smoking about 8 years ago. He has never used smokeless tobacco. He reports that he drinks about 0.6 oz of alcohol per week. He reports that he does not use illicit drugs.   Family History:  The patient's family history includes Cancer in his brother; Diabetes in his father; Heart attack in his father; Heart disease in his brother; Hypertension in his father and mother; Lung cancer in his father; Stroke in his father and mother. There is no history of Malignant hyperthermia.    ROS:   Please see the history of present illness.   Review of Systems  Constitution: Positive for chills.  HENT: Positive for headaches.   Skin: Positive for rash.  All other systems reviewed and are negative.    PHYSICAL EXAM: VS:  BP 156/66 mmHg  Pulse 63  Ht 5' 11"  (1.803 m)  Wt 243 lb (110.224 kg)  BMI 33.91 kg/m2  SpO2 97%    Wt Readings from Last 3 Encounters:  12/10/14 243 lb (110.224 kg)  11/28/14 241 lb (109.317 kg)  11/16/14 240 lb 4.8 oz (108.999 kg)     GEN: Well nourished, well developed, in no acute distress HEENT: normal Neck: no JVD, no masses Cardiac:  Normal S1/S2, RRR; 2/6 harsh systolic murmur RUSB,  no rubs or gallops, trace bilateral LE edema ; right wrist without hematoma or mass  Respiratory:  clear to auscultation bilaterally, no wheezing, rhonchi or rales. GI: soft, nontender, nondistended  MS: no deformity or atrophy Skin: warm and dry  Neuro:  CNs II-XII intact, Strength and sensation are intact Psych: Normal affect   EKG:  EKG is ordered today.  It demonstrates:   NSR, HR 63, normal axis,  inferolateral T-wave inversions   Recent Labs: 10/11/2014: Magnesium 2.0 11/13/2014: ALT 15; B Natriuretic Peptide 502.4*; TSH 1.442 11/16/2014: BUN 11;  Creatinine 1.01; Hemoglobin 9.9*; Platelets 134*; Potassium 3.8; Sodium 140    Lipid Panel    Component Value Date/Time   CHOL 165 10/12/2014 0248   TRIG 792* 10/12/2014 0248   HDL 22* 10/12/2014 0248   CHOLHDL 7.5 10/12/2014 0248   VLDL UNABLE TO CALCULATE IF TRIGLYCERIDE OVER 400 mg/dL 10/12/2014 0248   LDLCALC UNABLE TO CALCULATE IF TRIGLYCERIDE OVER 400 mg/dL 10/12/2014 0248   LDLDIRECT 77.2 03/19/2014 1132      ASSESSMENT AND PLAN:  PAF (paroxysmal atrial fibrillation)  Maintaining NSR. He is tolerating Eliquis. He denies any bleeding problems. He remains on amiodarone. I will obtain pulmonary function tests with DLCO for baseline. He will need follow-up LFTs and TSH when he sees Dr. Mare Ferrari next month. We will also need to consider repeat CBC at that time. He sees an eye doctor on a yearly basis.    Chronic diastolic CHF (congestive heart failure) Volume appears stable on low-dose Lasix. Check BMET today.   Coronary artery disease  No angina. I reviewed his cardiac catheterization findings with him today. Continue beta blocker. We will need to consider statin therapy. See discussion below regarding lipid clinic. Given his history of GI bleeding and the fact that he is on Eliquis, he does not need aspirin. Discontinue aspirin.   Carotid stenosis, bilateral  Recent carotid US demonstrates bilateral ICA 40-59%. Repeat carotid US will be obtained in one year.   Brachiocephalic artery stenosis, right He has symptoms suspicious for right arm claudication. He denies any steal symptoms. I reviewed his case today with Dr. Tamala Julian, who did his cardiac catheterization. We will arrange an aortic arch CT angiogram with upper extremity runoff to rule out significant right innominate artery stenosis.    Essential hypertension Blood  pressure elevated today. He tells me that his blood pressures at home are more optimal. Continue to monitor for now. Consider increasing lisinopril if his blood pressure remains greater than 140/90   Hyperlipidemia  His triglycerides are significantly elevated. He is on gemfibrozil. With coronary artery disease, he would benefit from statin therapy. I have recommended referral to our lipid clinic for better optimization of his lipids. This will be arranged.   History of GI bleed Recent hemoglobin stable. Consider repeat CBC at follow-up.    Current medicines are reviewed at length with the patient today.  Concerns regarding medicines are as outlined above.  The following changes have been made:    DC Aspirin     Labs/ tests ordered today include:  Orders Placed This Encounter  Procedures  . CT ANGIO CHEST AORTA W/CM &/OR WO/CM  . CT Angio Up Extrem Right W/Cm &/Or Wo/Cm  . Basic Metabolic Panel (BMET)  . Hepatic function panel  . TSH  . EKG 12-Lead  . Pulmonary function test    Disposition:   FU with  Dr. Mare Ferrari next month as planned.    Signed, Versie Starks, MHS 12/10/2014 [facility] [place], [place], [place]  [address] Phone: [phone]; Fax: [phone]

## 2014-12-10 NOTE — Patient Instructions (Addendum)
Medication Instructions:  Your physician recommends that you continue on your current medications as directed. Please refer to the Current Medication list given to you today.   Labwork: 1. TODAY BMET  2. 01/16/15 TSH AND LIVER PANEL TO BE DONE WHEN YOU SEE DR. BRACKBILL  Testing/Procedures: 1. YOU WILL NEED TO BE SCHEDULED FOR A CT-A CHEST TO BE DONE AT [place] (AORTIC ARCH CT-A WITH UE RUN OFF... R/O RIGHT INTERMITTENT ARTERY STENOSIS)  2. YOU WILL NEED TO BE SCHEDULED FOR CT-A UPPER EXTREMITY RIGHT; (AORTIC ARCH CT-A WITH UE RUN OFF... R/O RIGHT INTERMITTENT ARTERY STENOSIS)  3. Your physician has requested that you have a carotid duplex THIS IS TO BE DONE IN 1 YEAR. This test is an ultrasound of the carotid arteries in your neck. It looks at blood flow through these arteries that supply the brain with blood. Allow one hour for this exam. There are no restrictions or special instructions.  4. Your physician has recommended that you have a pulmonary function test AT [place]. Pulmonary Function Tests are a group of tests that measure how well air moves in and out of your lungs.  5. YOU ARE BEING REFERRED TO LIPID CLINIC; DX DYSLIPIDEMIA  Follow-Up: YOU HAVE A FOLLOW UP WITH DR. Mare Ferrari 01/16/15  Any Other Special Instructions Will Be Listed Below (If Applicable).

## 2014-12-11 ENCOUNTER — Telehealth: Payer: Self-pay | Admitting: *Deleted

## 2014-12-11 LAB — BASIC METABOLIC PANEL
BUN: 20 mg/dL (ref 6–23)
CO2: 26 meq/L (ref 19–32)
CREATININE: 1.19 mg/dL (ref 0.40–1.50)
Calcium: 9 mg/dL (ref 8.4–10.5)
Chloride: 103 mEq/L (ref 96–112)
GFR: 63.83 mL/min (ref >60.00)
GLUCOSE: 71 mg/dL (ref 70–99)
Potassium: 4.2 mEq/L (ref 3.5–5.1)
SODIUM: 136 meq/L (ref 135–145)

## 2014-12-11 NOTE — Telephone Encounter (Signed)
Pt's wife notified lab results as well as to stop the ASA per Brynda Rim. PA since pt is on Eliquis. Wife verbalized understanding by phone to results and instructions.

## 2014-12-13 ENCOUNTER — Ambulatory Visit (HOSPITAL_COMMUNITY)
Admission: RE | Admit: 2014-12-13 | Discharge: 2014-12-13 | Disposition: A | Discharge status: Home / Self Care | Payer: Medicare Other | Source: Ambulatory Visit | Attending: Physician Assistant | Admitting: Physician Assistant

## 2014-12-13 DIAGNOSIS — R918 Other nonspecific abnormal finding of lung field: Secondary | ICD-10-CM | POA: Insufficient documentation

## 2014-12-13 DIAGNOSIS — I871 Compression of vein: Secondary | ICD-10-CM | POA: Diagnosis not present

## 2014-12-13 DIAGNOSIS — I700 Atherosclerosis of aorta: Secondary | ICD-10-CM | POA: Insufficient documentation

## 2014-12-13 DIAGNOSIS — I708 Atherosclerosis of other arteries: Secondary | ICD-10-CM | POA: Diagnosis not present

## 2014-12-13 DIAGNOSIS — I771 Stricture of artery: Secondary | ICD-10-CM | POA: Diagnosis not present

## 2014-12-13 DIAGNOSIS — K8020 Calculus of gallbladder without cholecystitis without obstruction: Secondary | ICD-10-CM | POA: Insufficient documentation

## 2014-12-13 DIAGNOSIS — M79601 Pain in right arm: Secondary | ICD-10-CM | POA: Diagnosis not present

## 2014-12-13 DIAGNOSIS — J9809 Other diseases of bronchus, not elsewhere classified: Secondary | ICD-10-CM | POA: Diagnosis not present

## 2014-12-13 MED ORDER — IOHEXOL 350 MG/ML SOLN
100.0000 mL | Freq: Once | INTRAVENOUS | Status: AC | PRN
  Administered 2014-12-13: 100 mL via INTRAVENOUS

## 2014-12-14 ENCOUNTER — Ambulatory Visit (INDEPENDENT_AMBULATORY_CARE_PROVIDER_SITE_OTHER): Payer: Medicare Other | Admitting: Pharmacist

## 2014-12-14 ENCOUNTER — Telehealth: Payer: Self-pay | Admitting: *Deleted

## 2014-12-14 ENCOUNTER — Ambulatory Visit: Payer: Medicare Other | Admitting: Pharmacist

## 2014-12-14 DIAGNOSIS — E781 Pure hyperglyceridemia: Secondary | ICD-10-CM | POA: Diagnosis not present

## 2014-12-14 DIAGNOSIS — I6523 Occlusion and stenosis of bilateral carotid arteries: Secondary | ICD-10-CM

## 2014-12-14 MED ORDER — ATORVASTATIN CALCIUM 40 MG PO TABS: 40.0000 mg | ORAL_TABLET | Freq: Every day | ORAL | Status: DC

## 2014-12-14 MED ORDER — FENOFIBRATE 160 MG PO TABS: 160.0000 mg | ORAL_TABLET | Freq: Every day | ORAL | Status: DC

## 2014-12-14 NOTE — Patient Instructions (Signed)
Stop Gemfibrozil   Start Fenofibrate 160mg  once daily with food and Lipitor 40mg  once daily.   Talk with Dr. Alain Marion about adjusting your diabetic medications.   Recheck your labs in 3 months.

## 2014-12-14 NOTE — Telephone Encounter (Signed)
lmptcb on both home and cell # to go over CTA  results as well as recommendation to refer to Dr. Fletcher Anon.

## 2014-12-17 NOTE — Telephone Encounter (Signed)
Follow Up        Pt's wife returning Carol's phone call.

## 2014-12-17 NOTE — Telephone Encounter (Signed)
Wife notified of CTA results by phone with verbal understanding to Plan of care. Wife aware our office will call with an appt for Dr. Fletcher Anon to discuss further in the findings in CTA. Wife said ok to leave detailed message of date and time of appt on machine. Wife said thank you for all of our help.

## 2014-12-18 ENCOUNTER — Other Ambulatory Visit: Payer: Self-pay | Admitting: Internal Medicine

## 2014-12-18 ENCOUNTER — Ambulatory Visit (INDEPENDENT_AMBULATORY_CARE_PROVIDER_SITE_OTHER): Payer: Medicare Other | Admitting: Internal Medicine

## 2014-12-18 ENCOUNTER — Encounter: Payer: Self-pay | Admitting: Internal Medicine

## 2014-12-18 VITALS — BP 180/68 | HR 63 | Wt 243.0 lb

## 2014-12-18 DIAGNOSIS — M79605 Pain in left leg: Secondary | ICD-10-CM

## 2014-12-18 DIAGNOSIS — B0229 Other postherpetic nervous system involvement: Secondary | ICD-10-CM | POA: Diagnosis not present

## 2014-12-18 DIAGNOSIS — E0822 Diabetes mellitus due to underlying condition with diabetic chronic kidney disease: Secondary | ICD-10-CM

## 2014-12-18 DIAGNOSIS — I6523 Occlusion and stenosis of bilateral carotid arteries: Secondary | ICD-10-CM | POA: Diagnosis not present

## 2014-12-18 MED ORDER — GABAPENTIN 100 MG PO CAPS: 100.0000 mg | ORAL_CAPSULE | Freq: Three times a day (TID) | ORAL | Status: DC | PRN

## 2014-12-18 NOTE — Progress Notes (Signed)
Pre visit review using our clinic review tool, if applicable. No additional management support is needed unless otherwise documented below in the visit note. 

## 2014-12-18 NOTE — Assessment & Plan Note (Signed)
Gabapentin po 

## 2014-12-18 NOTE — Patient Instructions (Signed)
Postherpetic Neuralgia Postherpetic neuralgia (PHN) is nerve pain that occurs after a shingles infection. Shingles is a painful rash that appears on one side of the body, usually on your trunk or face. Shingles is caused by the varicella-zoster virus. This is the same virus that causes chickenpox. In people who have had chickenpox, the virus can resurface years later and cause shingles. You may have PHN if you continue to have pain for 3 months after your shingles rash has gone away. PHN appears in the same area where you had the shingles rash. For most people, PHN goes away within 1 year.  Getting a vaccination for shingles can prevent PHN. This vaccine is recommended for people older than 50. It may prevent shingles and may also lower your risk of PHN if you do get shingles. CAUSES PHN is caused by damage to your nerves from the varicella-zoster virus. This damage makes your nerves overly sensitive.  RISK FACTORS Aging is the biggest risk factor for developing PHN. Most people who get PHN are older than 60. Other risk factors include:  Having very bad pain before your shingles rash starts.  Having a very bad rash.  Having shingles in the nerve that supplies your face and eye (trigeminal nerve). SIGNS AND SYMPTOMS Pain is the main symptom of PHN. The pain is often very bad and may be described as stabbing, burning, or feeling like an electric shock. The pain may come and go or may be there all the time. Pain may be triggered by light touches on the skin or changes in temperature. You may have itching along with the pain. DIAGNOSIS  Your health care provider may diagnose PHN based on your symptoms and your history of shingles. Lab studies and other diagnostic tests are usually not needed. TREATMENT  There is no cure for PHN. Treatment for PHN will focus on pain relief. Over-the-counter pain relievers do not usually relieve PHN pain. You may need to work with a pain specialist. Treatment may  include:  Antidepressant medicines to help with pain and improve sleep.  Antiseizure medicines to relieve nerve pain.  Strong pain relievers (opioids).  A numbing patch worn on the skin (lidocaine patch). HOME CARE INSTRUCTIONS It may take a long time to recover from PHN. Work closely with your health care provider, and have a good support system at home.   Take all medicines as directed by your health care provider.  Wear loose, comfortable clothing.  Cover sensitive areas with a dressing to reduce friction from clothing rubbing on the area.  If cold does not make your pain worse, try applying a cool compress or cooling gel pack to the area.  Talk to your health care provider if you feel depressed or desperate. Living with long-term pain can be depressing. SEEK MEDICAL CARE IF:  Your medicine is not helping.  You are struggling to manage your pain at home. Document Released: 10/03/2002 Document Revised: 11/27/2013 Document Reviewed: 07/04/2013 Exit[employer] Patient Information 2015 [employer]. This information is not intended to replace advice given to you by your health care provider. Make sure you discuss any questions you have with your health care provider.  

## 2014-12-18 NOTE — Progress Notes (Signed)
Subjective:     HPI  C/o LLE pain following shingles - whole leg. Pain is 4/10 in the daytime, much worse at night...  F/u HTN, PVD, DM, hypothyroidism, Vit D def F/u occip HAs - better. BP is ok at home F/u R shoulder bl pain after putting up tree decorations - better  BP Readings from Last 3 Encounters:  12/18/14 180/68  12/10/14 156/66  11/28/14 150/60    Wt Readings from Last 3 Encounters:  12/18/14 243 lb (110.224 kg)  12/10/14 243 lb (110.224 kg)  11/28/14 241 lb (109.317 kg)     Review of Systems  Constitutional: Negative for appetite change and unexpected weight change.  HENT: Negative for nosebleeds, sneezing and trouble swallowing.   Eyes: Negative for itching and visual disturbance.  Cardiovascular: Negative for chest pain, palpitations and leg swelling.  Gastrointestinal: Negative for nausea, blood in stool and abdominal distention.  Genitourinary: Negative for frequency and hematuria.  Musculoskeletal: Negative for back pain, joint swelling, gait problem and neck pain.  Neurological: Negative for dizziness, tremors, speech difficulty and weakness.  Psychiatric/Behavioral: Negative for sleep disturbance, dysphoric mood and agitation. The patient is not nervous/anxious.        Objective:   Physical Exam  Constitutional: He is oriented to person, place, and time. He appears well-developed. No distress.  NAD  HENT:  Mouth/Throat: Oropharynx is clear and moist.  Eyes: Conjunctivae are normal. Pupils are equal, round, and reactive to light.  Neck: Normal range of motion. No JVD present. No thyromegaly present.  Cardiovascular: Normal rate, regular rhythm, normal heart sounds and intact distal pulses.  Exam reveals no gallop and no friction rub.   No murmur heard. Pulmonary/Chest: Effort normal and breath sounds normal. No respiratory distress. He has no wheezes. He has no rales. He exhibits no tenderness.  Abdominal: Soft. Bowel sounds are normal. He  exhibits no distension and no mass. There is no tenderness. There is no rebound and no guarding.  Musculoskeletal: Normal range of motion. He exhibits no edema or tenderness.  Lymphadenopathy:    He has no cervical adenopathy.  Neurological: He is alert and oriented to person, place, and time. He has normal reflexes. No cranial nerve deficit. He exhibits normal muscle tone. He displays a negative Romberg sign. Coordination and gait normal.  No meningeal signs  Skin: Skin is warm and dry. Rash noted.  Psychiatric: He has a normal mood and affect. His behavior is normal. Judgment and thought content normal.  psoriatc patches - trunk, extr-s, scalp R sh blade is less tender in suprsinat area No new rash  Lab Results  Component Value Date   WBC 4.5 11/16/2014   HGB 9.9* 11/16/2014   HCT 30.2* 11/16/2014   PLT 134* 11/16/2014   GLUCOSE 71 12/10/2014   CHOL 165 10/12/2014   TRIG 792* 10/12/2014   HDL 22* 10/12/2014   LDLDIRECT 77.2 03/19/2014   LDLCALC UNABLE TO CALCULATE IF TRIGLYCERIDE OVER 400 mg/dL 10/12/2014   ALT 15 11/13/2014   AST 24 11/13/2014   NA 136 12/10/2014   K 4.2 12/10/2014   CL 103 12/10/2014   CREATININE 1.19 12/10/2014   BUN 20 12/10/2014   CO2 26 12/10/2014   TSH 1.442 11/13/2014   PSA 0.50 11/07/2013   INR 1.15 11/13/2014   HGBA1C 8.4* 10/12/2014   MICROALBUR 1.2 05/16/2013   12/13/14 CT IMPRESSION: Proximal right subclavian stenosis secondary to calcified plaque formation, again degree of stenosis is difficult to truly assess but estimated  at greater than 50% with poststenotic dilatation of the more peripheral right subclavian artery.  This could result in subclavian steal phenomenon.  Otherwise the right upper extremity vasculature appears patent with note of a high origin of the right radial artery above the elbow, a normal variant.  Cholelithiasis  Abdominal atherosclerosis as described  No acute process demonstrated by arterial phase  imaging.  Central bilateral bronchial thickening with low lung volumes, nonspecific.   Electronically Signed  By: Jerilynn Mages. Shick M.D.  On: 12/13/2014 16:16      Assessment & Plan:

## 2014-12-18 NOTE — Assessment & Plan Note (Signed)
5/16 LLE Will try Gabapentin

## 2014-12-18 NOTE — Assessment & Plan Note (Signed)
Chronic on Metformin, Glipizide

## 2014-12-25 ENCOUNTER — Encounter: Payer: Self-pay | Admitting: Internal Medicine

## 2015-01-07 DIAGNOSIS — H43811 Vitreous degeneration, right eye: Secondary | ICD-10-CM | POA: Diagnosis not present

## 2015-01-07 DIAGNOSIS — H3581 Retinal edema: Secondary | ICD-10-CM | POA: Diagnosis not present

## 2015-01-07 DIAGNOSIS — H34811 Central retinal vein occlusion, right eye: Secondary | ICD-10-CM | POA: Diagnosis not present

## 2015-01-07 DIAGNOSIS — E119 Type 2 diabetes mellitus without complications: Secondary | ICD-10-CM | POA: Diagnosis not present

## 2015-01-08 ENCOUNTER — Ambulatory Visit (INDEPENDENT_AMBULATORY_CARE_PROVIDER_SITE_OTHER): Payer: Medicare Other | Admitting: Internal Medicine

## 2015-01-08 ENCOUNTER — Telehealth: Payer: Self-pay | Admitting: *Deleted

## 2015-01-08 ENCOUNTER — Encounter: Payer: Self-pay | Admitting: Physician Assistant

## 2015-01-08 DIAGNOSIS — Z79899 Other long term (current) drug therapy: Secondary | ICD-10-CM

## 2015-01-08 LAB — PULMONARY FUNCTION TEST
DL/VA % pred: 87 %
DL/VA: 4.05 ml/min/mmHg/L
DLCO UNC % PRED: 69 %
DLCO unc: 23.38 ml/min/mmHg
FEF 25-75 POST: 4.01 L/s
FEF 25-75 Pre: 2.81 L/sec
FEF2575-%Change-Post: 42 %
FEF2575-%Pred-Post: 163 %
FEF2575-%Pred-Pre: 114 %
FEV1-%Change-Post: 9 %
FEV1-%PRED-POST: 97 %
FEV1-%Pred-Pre: 88 %
FEV1-PRE: 2.93 L
FEV1-Post: 3.22 L
FEV1FVC-%Change-Post: 2 %
FEV1FVC-%PRED-PRE: 107 %
FEV6-%Change-Post: 6 %
FEV6-%Pred-Post: 93 %
FEV6-%Pred-Pre: 87 %
FEV6-Post: 3.99 L
FEV6-Pre: 3.73 L
FEV6FVC-%CHANGE-POST: 0 %
FEV6FVC-%Pred-Post: 106 %
FEV6FVC-%Pred-Pre: 106 %
FVC-%Change-Post: 7 %
FVC-%PRED-PRE: 82 %
FVC-%Pred-Post: 88 %
FVC-Post: 3.99 L
FVC-Pre: 3.73 L
POST FEV1/FVC RATIO: 81 %
PRE FEV1/FVC RATIO: 78 %
PRE FEV6/FVC RATIO: 100 %
Post FEV6/FVC ratio: 100 %
RV % pred: 91 %
RV: 2.34 L
TLC % pred: 83 %
TLC: 6.01 L

## 2015-01-08 NOTE — Progress Notes (Signed)
Cardiology Office Note   Date:  01/08/2015   ID:  Michael [name] Jul 06, 1941, MRN [id number]  PCP:  [name], MD  Cardiologist:  Dr. Darlin Coco    Chief Complaint  Patient presents with  . Hyperlipidemia     History of Present Illness: Michael [name] is a 74 y.o. male with a hx of PAF, HTN, HL, diabetes, prior GI bleed (2/2 small bowel AVM).  Cardiac catheterization demonstrated mild to moderate diagonal, circumflex and OM disease, normal LV function and borderline significant innominate artery stenosis. The patient underwent carotid US and upper extremity arterial duplex. He has bilateral ICA stenosis 40-59%. He is seen today for evaluation of his elevated TG and to evaluated for statin therapy in light of new ASCVD diagnosis.    Pt is currently taking gemfibrozil for his history of elevated TG.  He has also tried Niacin and pravastatin in the past.  He did not have issues with these but did not get the results needed.  His current A1c is 8.4.    Diet:  Pt states he avoids fried foods.  She likes steamed vegetables and is trying to limit potatoes and pasta.  He will have eggs and cereal for breakfast, bologna or ham sandwich for lunch with potato chips.  He likes to snack on fresh fruits such as banasa, strawberries and watermelon.  He drinks water with flavoring, diet soft drinks and unsweet tea with lemon.    Exercise:  Pt does not have any regular exercise routine.  He will work in the garden or yard at home and is doing some minor Architect projects.   LDL goal< 70 mg/dL Current Therapy: gemfibrozil 639m BID Intolerances: Niacin, pravastatin   Labs:  09/2014: TC 165, TG 792, HDL 22, LDL not calcuated    Past Medical History  Diagnosis Date  . Hypertension   . Hyperlipidemia   . Gout   . Arthritis   . Hypothyroidism   . Personal history of colonic polyps 2007, 2008    adenoma each time, largest 12 mm in 2007  . Iron deficiency anemia   . AVM  (arteriovenous malformation) of colon   . Allergy   . H/O transfusion of whole blood   . PAF (paroxysmal atrial fibrillation)     a. 09/2014: Converted on Dilt;  b. CHA2DS2VASc = 3-->eliquis;  c. 09/2014 Echo: EF 55-60%, mild LVH, mildly dil LA.  .Marland KitchenElevated troponin     a. 09/2014 in setting of AF RVR-->Myoview: EF 49%, no ischemia/infarct->Med Rx.  .Marland KitchenDysrhythmia     ATRIAL FIBRILATION  . Type II diabetes mellitus     TYPE 2  . GERD (gastroesophageal reflux disease)   . Psoriasis   . CAD (coronary artery disease)     a. Cath 10/2014 - Mild to moderate diagonal, circumflex and obtuse marginal disease. Innominate artery sternosis.  . Carotid artery disease     a. Carotid dopple 03/2014 453-74%RICA &  682-70%LICA stenosis b. 47/86    Current Outpatient Prescriptions  Medication Sig Dispense Refill  . acetaminophen (TYLENOL) 650 MG CR tablet Take 1,300 mg by mouth every 8 (eight) hours as needed for pain.     .Marland Kitchenallopurinol (ZYLOPRIM) 100 MG tablet Take 200 mg by mouth every morning.     .Marland Kitchenamiodarone (PACERONE) 200 MG tablet Take 1 tablet (200 mg total) by mouth daily. 30 tablet 6  . apixaban (ELIQUIS) 5 MG TABS tablet Take 1 tablet (5  mg total) by mouth 2 (two) times daily. 60 tablet 10  . atorvastatin (LIPITOR) 40 MG tablet Take 1 tablet (40 mg total) by mouth daily. 30 tablet 6  . Blood Glucose Monitoring Suppl (ONE TOUCH ULTRA 2) W/DEVICE KIT Use as directed Dx E11.9 1 each 0  . Cholecalciferol 1000 UNITS TBDP Take 1,000 Units by mouth daily.    . clobetasol (TEMOVATE) 0.05 % external solution Apply 1 application topically 2 (two) times daily. Apply to scalp    . diltiazem (CARDIZEM CD) 180 MG 24 hr capsule Take 1 capsule (180 mg total) by mouth daily. 30 capsule 11  . ergocalciferol (VITAMIN D2) 50000 UNITS capsule Take 1 capsule (50,000 Units total) by mouth once a week. 6 capsule 0  . fenofibrate 160 MG tablet Take 1 tablet (160 mg total) by mouth daily. 30 tablet 6  . ferrous sulfate  325 (65 FE) MG tablet Take 325 mg by mouth 3 (three) times daily with meals.    . furosemide (LASIX) 20 MG tablet TAKE 1 TABLET(20 MG) BY MOUTH DAILY 90 tablet 0  . gabapentin (NEURONTIN) 100 MG capsule TAKE 1 TO 2 CAPSULES BY MOUTH THREE TIMES DAILY AS NEEDED 540 capsule 3  . glipiZIDE (GLUCOTROL XL) 10 MG 24 hr tablet Take 1 tablet (10 mg total) by mouth every morning. 90 tablet 3  . glucose blood (ONE TOUCH ULTRA TEST) test strip Use as instructed 30 each 11  . Lancets (ONETOUCH ULTRASOFT) lancets Use to check blood sugars daily 30 each 11  . levothyroxine (SYNTHROID, LEVOTHROID) 150 MCG tablet Take 1 tablet (150 mcg total) by mouth daily before breakfast. 90 tablet 2  . lisinopril (PRINIVIL,ZESTRIL) 20 MG tablet Take 1 tablet (20 mg total) by mouth every morning. 90 tablet 3  . metoprolol tartrate (LOPRESSOR) 50 MG tablet Take 1 tablet (50 mg total) by mouth 2 (two) times daily. 60 tablet 6  . omeprazole (PRILOSEC) 40 MG capsule Take 1 capsule (40 mg total) by mouth daily. 90 capsule 3  . predniSONE (DELTASONE) 5 MG tablet Take 5 mg by mouth daily as needed (hand swelling).     . triamcinolone cream (KENALOG) 0.1 % Apply 1 application topically 2 (two) times daily. 80 g 3  . valACYclovir (VALTREX) 1000 MG tablet Take 1 tablet (1,000 mg total) by mouth 3 (three) times daily. 30 tablet 0   No current facility-administered medications for this visit.    Allergies:   Percocet    Social History:  The patient  reports that he quit smoking about 8 years ago. He has never used smokeless tobacco. He reports that he drinks about 0.6 oz of alcohol per week. He reports that he does not use illicit drugs.   Family History:  The patient's family history includes Cancer in his brother; Diabetes in his father; Heart attack in his father; Heart disease in his brother; Hypertension in his father and mother; Lung cancer in his father; Stroke in his father and mother. There is no history of Malignant  hyperthermia.    ROS   ASSESSMENT AND PLAN:  1.  Hyperlipidemia-  Pt's TG are severely elevated.  He is on a fibrate but elevated A1c may also be contributing to this elevation.  Discussed need to lower blood sugars in order to get adequate control of TG.  Suggested he follow up with PCP to determine if medication changes should be made.  Given his new finding of carotid stenosis, will also need statin therapy.  Gemfibrozil has a higher chance of myalgias in combination with statins so will change to fenofibrate.  Will add Lipitor rather than Crestor as statin for cost reason.  Will plan to recheck labs in 3 months.

## 2015-01-08 NOTE — Telephone Encounter (Signed)
Pt's wife DPR on file; wife notified of PFT results and to continue on current regimen. Wife verbalized understanding to plan of care.

## 2015-01-08 NOTE — Progress Notes (Signed)
PPPpp

## 2015-01-10 ENCOUNTER — Encounter: Payer: Self-pay | Admitting: Internal Medicine

## 2015-01-10 ENCOUNTER — Other Ambulatory Visit (INDEPENDENT_AMBULATORY_CARE_PROVIDER_SITE_OTHER): Payer: Medicare Other

## 2015-01-10 ENCOUNTER — Ambulatory Visit (INDEPENDENT_AMBULATORY_CARE_PROVIDER_SITE_OTHER): Payer: Medicare Other | Admitting: Internal Medicine

## 2015-01-10 VITALS — BP 144/68 | HR 64 | Temp 97.6°F | Ht 71.0 in | Wt 245.2 lb

## 2015-01-10 DIAGNOSIS — B029 Zoster without complications: Secondary | ICD-10-CM

## 2015-01-10 DIAGNOSIS — I6523 Occlusion and stenosis of bilateral carotid arteries: Secondary | ICD-10-CM

## 2015-01-10 DIAGNOSIS — E1165 Type 2 diabetes mellitus with hyperglycemia: Secondary | ICD-10-CM

## 2015-01-10 DIAGNOSIS — G2581 Restless legs syndrome: Secondary | ICD-10-CM | POA: Diagnosis not present

## 2015-01-10 DIAGNOSIS — E1140 Type 2 diabetes mellitus with diabetic neuropathy, unspecified: Secondary | ICD-10-CM

## 2015-01-10 LAB — BASIC METABOLIC PANEL
BUN: 23 mg/dL (ref 6–23)
CALCIUM: 9.2 mg/dL (ref 8.4–10.5)
CHLORIDE: 103 meq/L (ref 96–112)
CO2: 27 mEq/L (ref 19–32)
CREATININE: 1.24 mg/dL (ref 0.40–1.50)
GFR: 60.86 mL/min (ref >60.00)
Glucose, Bld: 212 mg/dL — ABNORMAL HIGH (ref 70–99)
Potassium: 4 mEq/L (ref 3.5–5.1)
Sodium: 135 mEq/L (ref 135–145)

## 2015-01-10 LAB — HEMOGLOBIN A1C: HEMOGLOBIN A1C: 5.9 % (ref 4.6–6.5)

## 2015-01-10 MED ORDER — CANAGLIFLOZIN 100 MG PO TABS: 100.0000 mg | ORAL_TABLET | Freq: Every day | ORAL | Status: DC

## 2015-01-10 NOTE — Progress Notes (Signed)
Subjective:     HPI  F/u LLE pain following shingles - whole leg. Pain is much better. C/o restless legs - much worse at night...  F/u HTN, PVD, DM, hypothyroidism, Vit D def. CBG>200 at home F/u occip HAs - better. BP is ok at home F/u R shoulder bl pain after putting up tree decorations - better  BP Readings from Last 3 Encounters:  01/10/15 144/68  12/18/14 180/68  12/10/14 156/66    Wt Readings from Last 3 Encounters:  01/10/15 245 lb 4 oz (111.245 kg)  12/18/14 243 lb (110.224 kg)  12/10/14 243 lb (110.224 kg)     Review of Systems  Constitutional: Negative for appetite change and unexpected weight change.  HENT: Negative for nosebleeds, sneezing and trouble swallowing.   Eyes: Negative for itching and visual disturbance.  Cardiovascular: Negative for chest pain, palpitations and leg swelling.  Gastrointestinal: Negative for nausea, blood in stool and abdominal distention.  Genitourinary: Negative for frequency and hematuria.  Musculoskeletal: Negative for back pain, joint swelling, gait problem and neck pain.  Neurological: Negative for dizziness, tremors, speech difficulty and weakness.  Psychiatric/Behavioral: Negative for sleep disturbance, dysphoric mood and agitation. The patient is not nervous/anxious.        Objective:   Physical Exam  Constitutional: He is oriented to person, place, and time. He appears well-developed. No distress.  NAD  HENT:  Mouth/Throat: Oropharynx is clear and moist.  Eyes: Conjunctivae are normal. Pupils are equal, round, and reactive to light.  Neck: Normal range of motion. No JVD present. No thyromegaly present.  Cardiovascular: Normal rate, regular rhythm, normal heart sounds and intact distal pulses.  Exam reveals no gallop and no friction rub.   No murmur heard. Pulmonary/Chest: Effort normal and breath sounds normal. No respiratory distress. He has no wheezes. He has no rales. He exhibits no tenderness.  Abdominal: Soft.  Bowel sounds are normal. He exhibits no distension and no mass. There is no tenderness. There is no rebound and no guarding.  Musculoskeletal: Normal range of motion. He exhibits no edema or tenderness.  Lymphadenopathy:    He has no cervical adenopathy.  Neurological: He is alert and oriented to person, place, and time. He has normal reflexes. No cranial nerve deficit. He exhibits normal muscle tone. He displays a negative Romberg sign. Coordination and gait normal.  No meningeal signs  Skin: Skin is warm and dry. Rash noted.  Psychiatric: He has a normal mood and affect. His behavior is normal. Judgment and thought content normal.  psoriatc patches - trunk, extr-s, scalp R sh blade is less tender in suprsinat area No new rash  Lab Results  Component Value Date   WBC 4.5 11/16/2014   HGB 9.9* 11/16/2014   HCT 30.2* 11/16/2014   PLT 134* 11/16/2014   GLUCOSE 71 12/10/2014   CHOL 165 10/12/2014   TRIG 792* 10/12/2014   HDL 22* 10/12/2014   LDLDIRECT 77.2 03/19/2014   LDLCALC UNABLE TO CALCULATE IF TRIGLYCERIDE OVER 400 mg/dL 10/12/2014   ALT 15 11/13/2014   AST 24 11/13/2014   NA 136 12/10/2014   K 4.2 12/10/2014   CL 103 12/10/2014   CREATININE 1.19 12/10/2014   BUN 20 12/10/2014   CO2 26 12/10/2014   TSH 1.442 11/13/2014   PSA 0.50 11/07/2013   INR 1.15 11/13/2014   HGBA1C 8.4* 10/12/2014   MICROALBUR 1.2 05/16/2013   12/13/14 CT IMPRESSION: Proximal right subclavian stenosis secondary to calcified plaque formation, again degree of stenosis  is difficult to truly assess but estimated at greater than 50% with poststenotic dilatation of the more peripheral right subclavian artery.  This could result in subclavian steal phenomenon.  Otherwise the right upper extremity vasculature appears patent with note of a high origin of the right radial artery above the elbow, a normal variant.  Cholelithiasis  Abdominal atherosclerosis as described  No acute process  demonstrated by arterial phase imaging.  Central bilateral bronchial thickening with low lung volumes, nonspecific.   Electronically Signed  By: Jerilynn Mages. Shick M.D.  On: 12/13/2014 16:16      Assessment & Plan:

## 2015-01-10 NOTE — Assessment & Plan Note (Signed)
On Glucotrol 6/16 added Invokana Other options discussed

## 2015-01-10 NOTE — Assessment & Plan Note (Signed)
2016 chronic On gabapentin now Improve CBG control

## 2015-01-10 NOTE — Progress Notes (Signed)
Pre visit review using our clinic review tool, if applicable. No additional management support is needed unless otherwise documented below in the visit note. 

## 2015-01-10 NOTE — Assessment & Plan Note (Signed)
Rash is resolving Pain is better

## 2015-01-16 ENCOUNTER — Encounter: Payer: Self-pay | Admitting: Cardiology

## 2015-01-16 ENCOUNTER — Ambulatory Visit (INDEPENDENT_AMBULATORY_CARE_PROVIDER_SITE_OTHER): Payer: Medicare Other | Admitting: Cardiology

## 2015-01-16 ENCOUNTER — Other Ambulatory Visit (INDEPENDENT_AMBULATORY_CARE_PROVIDER_SITE_OTHER): Payer: Medicare Other | Admitting: *Deleted

## 2015-01-16 VITALS — BP 140/68 | HR 64 | Ht 71.0 in | Wt 247.8 lb

## 2015-01-16 DIAGNOSIS — Z79899 Other long term (current) drug therapy: Secondary | ICD-10-CM | POA: Diagnosis not present

## 2015-01-16 DIAGNOSIS — I480 Paroxysmal atrial fibrillation: Secondary | ICD-10-CM | POA: Diagnosis not present

## 2015-01-16 DIAGNOSIS — E785 Hyperlipidemia, unspecified: Secondary | ICD-10-CM | POA: Diagnosis not present

## 2015-01-16 DIAGNOSIS — K8020 Calculus of gallbladder without cholecystitis without obstruction: Secondary | ICD-10-CM | POA: Insufficient documentation

## 2015-01-16 DIAGNOSIS — I771 Stricture of artery: Secondary | ICD-10-CM | POA: Insufficient documentation

## 2015-01-16 DIAGNOSIS — I6523 Occlusion and stenosis of bilateral carotid arteries: Secondary | ICD-10-CM | POA: Diagnosis not present

## 2015-01-16 DIAGNOSIS — I708 Atherosclerosis of other arteries: Secondary | ICD-10-CM | POA: Diagnosis not present

## 2015-01-16 DIAGNOSIS — I10 Essential (primary) hypertension: Secondary | ICD-10-CM

## 2015-01-16 HISTORY — DX: Stricture of artery: I77.1

## 2015-01-16 LAB — HEPATIC FUNCTION PANEL
ALK PHOS: 46 U/L (ref 39–117)
ALT: 19 U/L (ref 0–53)
AST: 25 U/L (ref 0–37)
Albumin: 4.4 g/dL (ref 3.5–5.2)
BILIRUBIN DIRECT: 0.1 mg/dL (ref 0.0–0.3)
BILIRUBIN TOTAL: 0.4 mg/dL (ref 0.2–1.2)
Total Protein: 7 g/dL (ref 6.0–8.3)

## 2015-01-16 LAB — TSH: TSH: 2.34 u[IU]/mL (ref 0.35–4.50)

## 2015-01-16 NOTE — Progress Notes (Addendum)
Cardiology Office Note   Date:  01/16/2015   ID:  [name], Kidney 01/25/1941, MRN [id number]  PCP:  [name], MD  Cardiologist: Darlin Coco MD  No chief complaint on file.    History of Present Illness: Michael [name] is a 74 y.o. male who presents for a scheduled follow-up office visit.  Michael [name] is a 74 y.o. male with a hx of PAF, HTN, HL, diabetes, prior GI bleed (2/2 small bowel AVM). He was admitted in March 2016 with atrial fibrillation with RVR. He converted to sinus rhythm with rate control therapy. He was placed on Eliquis for anticoagulation. He had elevated troponins. This was felt to be related to demand ischemia. Echocardiogram demonstrated normal LV function. Inpatient stress testing demonstrated no ischemia. He was seen in follow-up by Ignacia Bayley, NP 11/07/14.   Admitted 4/19-4/22 with chest pain in the setting of atrial fibrillation with RVR. He had evidence of vascular congestion and mild interstitial edema on chest x-ray and was given a dose of IV Lasix. He was also placed on amiodarone and converted to normal sinus rhythm. He again had elevated troponins. Cardiac catheterization demonstrated mild to moderate diagonal, circumflex and OM disease, normal LV function and borderline significant innominate artery stenosis. The patient underwent carotid US and upper extremity arterial duplex. He has bilateral ICA stenosis 40-59%. There was evidence of more proximal obstruction in the right upper extremity based upon waveform dampening.  The patient went on to have a CT angiogram which showed 50% stenosis of the right subclavian artery.  The patient experiences weakness of the right hand.  His blood pressure in the right arm is falsely low.  He mentioned that after his last visit he was expecting a referral to Dr. Fletcher Anon but for some reason this did not occur.  He returns for follow-up. He is here with his wife.  He is still having post herpetic neuralgia  symptoms from previous shingles on his left leg. The pain is improved. The rash is  resolved. He denies chest discomfort or shortness of breath. He denies palpitations. He denies syncope. He does have occasional dizziness. This seems to occur when he's out at the store. He thinks it may be related to low sugars. He denies near syncope. He called in to our office recently with increasing LE edema. He was placed on Lasix with improvement in his symptoms. He denies orthopnea or PND. At the time of his CT angiogram he was noted to have multiple deep tendon gallstones.  This was news to him.  He has not had any symptoms from his gallbladder. Patient has had no recurrent atrial fibrillation.  He remains on amiodarone.  We are checking labs today.  Past Medical History  Diagnosis Date  . Hypertension   . Hyperlipidemia   . Gout   . Arthritis   . Hypothyroidism   . Personal history of colonic polyps 2007, 2008    adenoma each time, largest 12 mm in 2007  . Iron deficiency anemia   . AVM (arteriovenous malformation) of colon   . Allergy   . H/O transfusion of whole blood   . PAF (paroxysmal atrial fibrillation)     a. 09/2014: Converted on Dilt;  b. CHA2DS2VASc = 3-->eliquis;  c. 09/2014 Echo: EF 55-60%, mild LVH, mildly dil LA.  [name] Elevated troponin     a. 09/2014 in setting of AF RVR-->Myoview: EF 49%, no ischemia/infarct->Med Rx.  [name] Dysrhythmia  ATRIAL FIBRILATION  . Type II diabetes mellitus     TYPE 2  . GERD (gastroesophageal reflux disease)   . Psoriasis   . CAD (coronary artery disease)     a. Cath 10/2014 - Mild to moderate diagonal, circumflex and obtuse marginal disease. Innominate artery sternosis.  . Carotid artery disease     a. Carotid dopple 03/2014 76-19% RICA &  50-93% LICA stenosis b. 2/67  . History of PFTs     PFTs 6/16:  FVC 3.73 (87%), FEV1 2.93 (88%), FEV1/FVC 78%, DLCO 69%    Past Surgical History  Procedure Laterality Date  . Rotator cuff repair      left  . Knee  arthroscopy      right  . Colonoscopy w/ polypectomy  04/09/2006    12 mm adenoma  . Colonoscopy w/ polypectomy  06/17/2007    5 mm adenoma  . Colonoscopy  02/10/2011    internal hemorrhoids  . Esophagogastroduodenoscopy  12/23/2011    Procedure: ESOPHAGOGASTRODUODENOSCOPY (EGD);  Surgeon: Irene Shipper, MD;  Location: [facility];  Service: Endoscopy;  Laterality: N/A;  with small bowel bx's  . Givens capsule study  12/28/2011  . Laparoscopic appendectomy N/A 02/09/2014    Procedure: APPENDECTOMY LAPAROSCOPIC;  Surgeon: Leighton Ruff, MD;  Location: [facility];  Service: General;  Laterality: N/A;  . Appendectomy  02/09/2014  . Left heart catheterization with coronary angiogram N/A 11/15/2014    Procedure: LEFT HEART CATHETERIZATION WITH CORONARY ANGIOGRAM;  Surgeon: Belva Crome, MD;  Location: [facility] CATH LAB;  Service: Cardiovascular;  Laterality: N/A;     Current Outpatient Prescriptions  Medication Sig Dispense Refill  . acetaminophen (TYLENOL) 650 MG CR tablet Take 1,300 mg by mouth every 8 (eight) hours as needed for pain.     [name] allopurinol (ZYLOPRIM) 100 MG tablet Take 200 mg by mouth every morning.     [name] amiodarone (PACERONE) 200 MG tablet Take 1 tablet (200 mg total) by mouth daily. 30 tablet 6  . apixaban (ELIQUIS) 5 MG TABS tablet Take 1 tablet (5 mg total) by mouth 2 (two) times daily. 60 tablet 10  . atorvastatin (LIPITOR) 40 MG tablet Take 1 tablet (40 mg total) by mouth daily. 30 tablet 6  . Blood Glucose Monitoring Suppl (ONE TOUCH ULTRA 2) W/DEVICE KIT Use as directed Dx E11.9 1 each 0  . canagliflozin (INVOKANA) 100 MG TABS tablet Take 1 tablet (100 mg total) by mouth daily. 30 tablet 11  . CARTIA XT 120 MG 24 hr capsule TAke 1 capsule by mouth once daily  11  . Cholecalciferol 1000 UNITS TBDP Take 1,000 Units by mouth daily.    . clobetasol (TEMOVATE) 0.05 % external solution Apply 1 application topically 2 (two) times daily. Apply to scalp    . diltiazem (CARDIZEM CD) 180 MG  24 hr capsule Take 1 capsule (180 mg total) by mouth daily. 30 capsule 11  . ergocalciferol (VITAMIN D2) 50000 UNITS capsule Take 1 capsule (50,000 Units total) by mouth once a week. 6 capsule 0  . fenofibrate 160 MG tablet Take 1 tablet (160 mg total) by mouth daily. 30 tablet 6  . ferrous sulfate 325 (65 FE) MG tablet Take 325 mg by mouth 3 (three) times daily with meals.    . furosemide (LASIX) 20 MG tablet TAKE 1 TABLET(20 MG) BY MOUTH DAILY 90 tablet 0  . gabapentin (NEURONTIN) 100 MG capsule Take 100-200 mg by mouth 3 (three) times daily as needed (pain).    [name]  glipiZIDE (GLUCOTROL XL) 10 MG 24 hr tablet Take 1 tablet (10 mg total) by mouth every morning. 90 tablet 3  . glucose blood (ONE TOUCH ULTRA TEST) test strip Use as instructed 30 each 11  . Lancets (ONETOUCH ULTRASOFT) lancets Use to check blood sugars daily 30 each 11  . levothyroxine (SYNTHROID, LEVOTHROID) 150 MCG tablet Take 1 tablet (150 mcg total) by mouth daily before breakfast. 90 tablet 2  . lisinopril (PRINIVIL,ZESTRIL) 20 MG tablet Take 1 tablet (20 mg total) by mouth every morning. 90 tablet 3  . metoprolol tartrate (LOPRESSOR) 50 MG tablet Take 1 tablet (50 mg total) by mouth 2 (two) times daily. 60 tablet 6  . omeprazole (PRILOSEC) 40 MG capsule Take 1 capsule (40 mg total) by mouth daily. 90 capsule 3  . predniSONE (DELTASONE) 5 MG tablet Take 5 mg by mouth daily as needed (hand swelling).     . triamcinolone cream (KENALOG) 0.1 % Apply 1 application topically 2 (two) times daily. 80 g 3  . valACYclovir (VALTREX) 1000 MG tablet Take 1 g by mouth daily.  0   No current facility-administered medications for this visit.    Allergies:   Percocet    Social History:  The patient  reports that he quit smoking about 8 years ago. He has never used smokeless tobacco. He reports that he drinks about 0.6 oz of alcohol per week. He reports that he does not use illicit drugs.   Family History:  The patient's family history  includes Cancer in his brother; Diabetes in his father; Heart attack in his father; Heart disease in his brother; Hypertension in his father and mother; Lung cancer in his father; Stroke in his father and mother. There is no history of Malignant hyperthermia.    ROS:  Please see the history of present illness.   Otherwise, review of systems are positive for none.   All other systems are reviewed and negative.    PHYSICAL EXAM: VS:  BP 140/68 mmHg  Pulse 64  Ht _0  (1.803 m)  Wt 247 lb 12.8 oz (112.401 kg)  BMI 34.58 kg/m2  SpO2 96% , BMI Body mass index is 34.58 kg/(m^2). GEN: Well nourished, well developed, in no acute distress HEENT: normal Neck: Bilateral carotid bruits.  There is a bruit over the right subclavian artery. Cardiac: RRR; no murmurs, rubs, or gallops,no edema.   the right radial pulse is significantly weaker than the left radial pulse Respiratory:  clear to auscultation bilaterally, normal work of breathing GI: soft, nontender, nondistended, + BS MS: no deformity or atrophy Skin: warm and dry, no rash Neuro:  Strength and sensation are intact Psych: euthymic mood, full affect   EKG:  EKG is not ordered today.    Recent Labs: 10/11/2014: Magnesium 2.0 11/13/2014: B Natriuretic Peptide 502.4* 11/16/2014: Hemoglobin 9.9*; Platelets 134* 01/10/2015: BUN 23; Creatinine, Ser 1.24; Potassium 4.0; Sodium 135 01/16/2015: ALT 19; TSH 2.34    Lipid Panel    Component Value Date/Time   CHOL 165 10/12/2014 0248   TRIG 792* 10/12/2014 0248   HDL 22* 10/12/2014 0248   CHOLHDL 7.5 10/12/2014 0248   VLDL UNABLE TO CALCULATE IF TRIGLYCERIDE OVER 400 mg/dL 10/12/2014 0248   LDLCALC UNABLE TO CALCULATE IF TRIGLYCERIDE OVER 400 mg/dL 10/12/2014 0248   LDLDIRECT 77.2 03/19/2014 1132      Wt Readings from Last 3 Encounters:  01/16/15 247 lb 12.8 oz (112.401 kg)  01/10/15 245 lb 4 oz (111.245 kg)  12/18/14  243 lb (110.224 kg)      Other studies Reviewed: Additional  studies/ records that were reviewed today include: Recent consultation with Elber[facility]. Review of the above records demonstrates: Gemfibrozil was changed to fibrillate and Lipitor was added.  So far the patient is not having any myalgias from his lipid therapy   ASSESSMENT AND PLAN:   PAF (paroxysmal atrial fibrillation)  Maintaining NSR. He is tolerating Eliquis. He denies any bleeding problems. He remains on amiodarone. . He sees an eye doctor on a yearly basis.   Chronic diastolic CHF (congestive heart failure) Volume appears stable on low-dose Lasix.  Coronary artery disease  No angina.    Carotid stenosis, bilateral  Recent carotid US demonstrates bilateral ICA 40-59%. Repeat carotid US will be obtained in one year.   Brachiocephalic artery stenosis, right He has symptoms suspicious for right arm claudication.   Essential hypertension Stable  Hyperlipidemia  Followed in lipid clinic.  Tolerating Lipitor and fenofibrate  History of GI bleed Recent hemoglobin stable.    Current medicines are reviewed at length with the patient today.  The patient does not have concerns regarding medicines.  The following changes have been made:  no change  Labs/ tests ordered today include:   Orders Placed This Encounter  Procedures  . Hepatic function panel  . Basic metabolic panel  . TSH  . CBC with Differential/Platelet    His physician: The patient will continue current medication.  He will return in 4 months for office visit EKG CBC TSH hepatic function panel and basal metabolic panel. We will refer him to Dr. Fletcher Anon regarding his right subclavian artery stenosis  Signed, Darlin Coco MD 01/16/2015 3:15 PM    [address], [place], [place]  [address] Phone: [phone]; Fax: [phone]

## 2015-01-16 NOTE — Patient Instructions (Signed)
Medication Instructions:  Your physician recommends that you continue on your current medications as directed. Please refer to the Current Medication list given to you today.  Labwork: NONE  Testing/Procedures: NONE  Follow-Up: Your physician wants you to follow-up in: 4 MONTH OV/TSH/HFP/EKG/CBC/BMET You will receive a reminder letter in the mail two months in advance. If you don't receive a letter, please call our office to schedule the follow-up appointment.  Any Other Special Instructions Will Be Listed Below (If Applicable). WILL ARRANGE FOR YOU TO SEE DR Fletcher Anon

## 2015-01-19 ENCOUNTER — Other Ambulatory Visit: Payer: Self-pay | Admitting: Internal Medicine

## 2015-01-20 ENCOUNTER — Emergency Department (HOSPITAL_COMMUNITY): Payer: Medicare Other

## 2015-01-20 ENCOUNTER — Encounter (HOSPITAL_COMMUNITY): Payer: Self-pay | Admitting: Nurse Practitioner

## 2015-01-20 ENCOUNTER — Emergency Department (HOSPITAL_COMMUNITY)
Admission: EM | Admit: 2015-01-20 | Discharge: 2015-01-20 | Disposition: A | Discharge status: Home / Self Care | Payer: Medicare Other | Attending: Emergency Medicine | Admitting: Emergency Medicine

## 2015-01-20 DIAGNOSIS — Z79899 Other long term (current) drug therapy: Secondary | ICD-10-CM | POA: Insufficient documentation

## 2015-01-20 DIAGNOSIS — D509 Iron deficiency anemia, unspecified: Secondary | ICD-10-CM | POA: Diagnosis not present

## 2015-01-20 DIAGNOSIS — E785 Hyperlipidemia, unspecified: Secondary | ICD-10-CM | POA: Insufficient documentation

## 2015-01-20 DIAGNOSIS — E119 Type 2 diabetes mellitus without complications: Secondary | ICD-10-CM | POA: Insufficient documentation

## 2015-01-20 DIAGNOSIS — M1990 Unspecified osteoarthritis, unspecified site: Secondary | ICD-10-CM | POA: Diagnosis not present

## 2015-01-20 DIAGNOSIS — Z87891 Personal history of nicotine dependence: Secondary | ICD-10-CM | POA: Insufficient documentation

## 2015-01-20 DIAGNOSIS — K219 Gastro-esophageal reflux disease without esophagitis: Secondary | ICD-10-CM | POA: Insufficient documentation

## 2015-01-20 DIAGNOSIS — M109 Gout, unspecified: Secondary | ICD-10-CM | POA: Diagnosis not present

## 2015-01-20 DIAGNOSIS — I10 Essential (primary) hypertension: Secondary | ICD-10-CM | POA: Diagnosis not present

## 2015-01-20 DIAGNOSIS — R110 Nausea: Secondary | ICD-10-CM | POA: Diagnosis not present

## 2015-01-20 DIAGNOSIS — I2510 Atherosclerotic heart disease of native coronary artery without angina pectoris: Secondary | ICD-10-CM | POA: Diagnosis not present

## 2015-01-20 DIAGNOSIS — R011 Cardiac murmur, unspecified: Secondary | ICD-10-CM | POA: Insufficient documentation

## 2015-01-20 DIAGNOSIS — J9809 Other diseases of bronchus, not elsewhere classified: Secondary | ICD-10-CM | POA: Diagnosis not present

## 2015-01-20 DIAGNOSIS — Z86010 Personal history of colonic polyps: Secondary | ICD-10-CM | POA: Insufficient documentation

## 2015-01-20 DIAGNOSIS — R030 Elevated blood-pressure reading, without diagnosis of hypertension: Secondary | ICD-10-CM | POA: Diagnosis not present

## 2015-01-20 LAB — COMPREHENSIVE METABOLIC PANEL
ALBUMIN: 3.9 g/dL (ref 3.5–5.0)
ALT: 19 U/L (ref 17–63)
ANION GAP: 8 (ref 5–15)
AST: 45 U/L — ABNORMAL HIGH (ref 15–41)
Alkaline Phosphatase: 43 U/L (ref 38–126)
BUN: 20 mg/dL (ref 6–20)
CHLORIDE: 104 mmol/L (ref 101–111)
CO2: 25 mmol/L (ref 22–32)
Calcium: 8.6 mg/dL — ABNORMAL LOW (ref 8.9–10.3)
Creatinine, Ser: 1.18 mg/dL (ref 0.61–1.24)
GFR calc Af Amer: >60 mL/min (ref >60)
GFR, EST NON AFRICAN AMERICAN: 60 mL/min — AB (ref >60)
GLUCOSE: 254 mg/dL — AB (ref 65–99)
POTASSIUM: 4.6 mmol/L (ref 3.5–5.1)
Sodium: 137 mmol/L (ref 135–145)
Total Bilirubin: 0.7 mg/dL (ref 0.3–1.2)
Total Protein: 6.1 g/dL — ABNORMAL LOW (ref 6.5–8.1)

## 2015-01-20 LAB — CBC WITH DIFFERENTIAL/PLATELET
BASOS ABS: 0 10*3/uL (ref 0.0–0.1)
BASOS PCT: 0 % (ref 0–1)
EOS PCT: 3 % (ref 0–5)
Eosinophils Absolute: 0.2 10*3/uL (ref 0.0–0.7)
HEMATOCRIT: 34.7 % — AB (ref 39.0–52.0)
Hemoglobin: 12.1 g/dL — ABNORMAL LOW (ref 13.0–17.0)
Lymphocytes Relative: 12 % (ref 12–46)
Lymphs Abs: 0.7 10*3/uL (ref 0.7–4.0)
MCH: 28.8 pg (ref 26.0–34.0)
MCHC: 34.9 g/dL (ref 30.0–36.0)
MCV: 82.6 fL (ref 78.0–100.0)
Monocytes Absolute: 0.4 10*3/uL (ref 0.1–1.0)
Monocytes Relative: 7 % (ref 3–12)
NEUTROS PCT: 78 % — AB (ref 43–77)
Neutro Abs: 4.3 10*3/uL (ref 1.7–7.7)
Platelets: 171 10*3/uL (ref 150–400)
RBC: 4.2 MIL/uL — AB (ref 4.22–5.81)
RDW: 16.4 % — ABNORMAL HIGH (ref 11.5–15.5)
WBC: 5.5 10*3/uL (ref 4.0–10.5)

## 2015-01-20 LAB — I-STAT TROPONIN, ED
TROPONIN I, POC: 0.01 ng/mL (ref 0.00–0.08)
Troponin i, poc: 0 ng/mL (ref 0.00–0.08)

## 2015-01-20 LAB — PROTIME-INR
INR: 1.17 (ref 0.00–1.49)
Prothrombin Time: 15.1 seconds (ref 11.6–15.2)

## 2015-01-20 LAB — LIPASE, BLOOD: Lipase: 27 U/L (ref 22–51)

## 2015-01-20 MED ORDER — ONDANSETRON 4 MG PO TBDP: 4.0000 mg | ORAL_TABLET | Freq: Three times a day (TID) | ORAL | Status: DC | PRN

## 2015-01-20 MED ORDER — ONDANSETRON HCL 4 MG/2ML IJ SOLN: 4.0000 mg | Freq: Once | INTRAMUSCULAR | Status: DC

## 2015-01-20 NOTE — ED Provider Notes (Signed)
CSN: [id number]     Arrival date & time 01/20/15  0255 History  This chart was scribed for Everlene Balls, MD by Evelene Croon, ED Scribe. This patient was seen in room D35C/D35C and the patient's care was started 3:19 AM.     Chief Complaint  Patient presents with  . Hypertension  . Nausea    The history is provided by the patient. No language interpreter was used.     HPI Comments:  Michael [name] is a 74 y.o. male with a PMHx of HTN, CAD and DM who presents to the Emergency Department via EMS complaining of constant  nausea that started about hour PTA. Wife reports blood sugar of  252 this am. She notes she was unable to get BP reading at home so she called EMS and they were able to manually obtain BP of 190/100. Wife notes earlier in the day pt complained of right hand pain which he has had intermittently for awhile. Hand pain had resolved when nausea began. He also denies vomiting, diarrhea, CP , and  SOB. He is complaint with HTN medications. Pt has an appointment on 02/07/15 with Dr Fletcher Anon for evaluation of right subclavian artery stenosis. No alleviating factors noted.   Past Medical History  Diagnosis Date  . Hypertension   . Hyperlipidemia   . Gout   . Arthritis   . Hypothyroidism   . Personal history of colonic polyps 2007, 2008    adenoma each time, largest 12 mm in 2007  . Iron deficiency anemia   . AVM (arteriovenous malformation) of colon   . Allergy   . H/O transfusion of whole blood   . PAF (paroxysmal atrial fibrillation)     a. 09/2014: Converted on Dilt;  b. CHA2DS2VASc = 3-->eliquis;  c. 09/2014 Echo: EF 55-60%, mild LVH, mildly dil LA.  [name] Elevated troponin     a. 09/2014 in setting of AF RVR-->Myoview: EF 49%, no ischemia/infarct->Med Rx.  [name] Dysrhythmia     ATRIAL FIBRILATION  . Type II diabetes mellitus     TYPE 2  . GERD (gastroesophageal reflux disease)   . Psoriasis   . CAD (coronary artery disease)     a. Cath 10/2014 - Mild to moderate diagonal, circumflex  and obtuse marginal disease. Innominate artery sternosis.  . Carotid artery disease     a. Carotid dopple 03/2014 09-23% RICA &  30-07% LICA stenosis b. 6/22  . History of PFTs     PFTs 6/16:  FVC 3.73 (87%), FEV1 2.93 (88%), FEV1/FVC 78%, DLCO 69%   Past Surgical History  Procedure Laterality Date  . Rotator cuff repair      left  . Knee arthroscopy      right  . Colonoscopy w/ polypectomy  04/09/2006    12 mm adenoma  . Colonoscopy w/ polypectomy  06/17/2007    5 mm adenoma  . Colonoscopy  02/10/2011    internal hemorrhoids  . Esophagogastroduodenoscopy  12/23/2011    Procedure: ESOPHAGOGASTRODUODENOSCOPY (EGD);  Surgeon: Irene Shipper, MD;  Location: [facility];  Service: Endoscopy;  Laterality: N/A;  with small bowel bx's  . Givens capsule study  12/28/2011  . Laparoscopic appendectomy N/A 02/09/2014    Procedure: APPENDECTOMY LAPAROSCOPIC;  Surgeon: Leighton Ruff, MD;  Location: [facility];  Service: General;  Laterality: N/A;  . Appendectomy  02/09/2014  . Left heart catheterization with coronary angiogram N/A 11/15/2014    Procedure: LEFT HEART CATHETERIZATION WITH CORONARY ANGIOGRAM;  Surgeon: Lynnell Dike  Tamala Julian, MD;  Location: [facility] CATH [facility];  Service: Cardiovascular;  Laterality: N/A;   Family History  Problem Relation Age of Onset  . Malignant hyperthermia Neg Hx   . Heart attack Father   . Lung cancer Father   . Diabetes Father   . Stroke Father   . Hypertension Father   . Stroke Mother   . Hypertension Mother   . Cancer Brother     liver  . Heart disease Brother     chf   History  Substance Use Topics  . Smoking status: Former Smoker    Quit date: 03/29/2006  . Smokeless tobacco: Never Used  . Alcohol Use: 0.6 oz/week    1 Cans of beer per week     Comment: occasional alcohol intake    Review of Systems  A complete 10 system review of systems was obtained and all systems are negative except as noted in the HPI and PMH.    Allergies  Percocet  Home Medications    Prior to Admission medications   Medication Sig Start Date End Date Taking? Authorizing Provider  acetaminophen (TYLENOL) 650 MG CR tablet Take 1,300 mg by mouth every 8 (eight) hours as needed for pain.     Historical Provider, MD  allopurinol (ZYLOPRIM) 100 MG tablet Take 200 mg by mouth every morning.     Historical Provider, MD  amiodarone (PACERONE) 200 MG tablet Take 1 tablet (200 mg total) by mouth daily. 11/16/14   Bhavinkumar Bhagat, PA  apixaban (ELIQUIS) 5 MG TABS tablet Take 1 tablet (5 mg total) by mouth 2 (two) times daily. 10/13/14   Brittainy Erie Noe, PA-C  atorvastatin (LIPITOR) 40 MG tablet Take 1 tablet (40 mg total) by mouth daily. 12/14/14   Darlin Coco, MD  Blood Glucose Monitoring Suppl (ONE TOUCH ULTRA 2) W/DEVICE KIT Use as directed Dx E11.9 05/18/14   [name] V, MD  canagliflozin (INVOKANA) 100 MG TABS tablet Take 1 tablet (100 mg total) by mouth daily. 01/10/15   Aleksei [name] V, MD  CARTIA XT 120 MG 24 hr capsule TAke 1 capsule by mouth once daily 01/02/15   Historical Provider, MD  Cholecalciferol 1000 UNITS TBDP Take 1,000 Units by mouth daily.    Historical Provider, MD  clobetasol (TEMOVATE) 0.05 % external solution Apply 1 application topically 2 (two) times daily. Apply to scalp    Historical Provider, MD  diltiazem (CARDIZEM CD) 180 MG 24 hr capsule Take 1 capsule (180 mg total) by mouth daily. 11/16/14   Bhavinkumar Bhagat, PA  ergocalciferol (VITAMIN D2) 50000 UNITS capsule Take 1 capsule (50,000 Units total) by mouth once a week. 11/08/13   Aleksei [name] V, MD  fenofibrate 160 MG tablet Take 1 tablet (160 mg total) by mouth daily. 12/14/14   Darlin Coco, MD  ferrous sulfate 325 (65 FE) MG tablet Take 325 mg by mouth 3 (three) times daily with meals.    Historical Provider, MD  furosemide (LASIX) 20 MG tablet TAKE 1 TABLET(20 MG) BY MOUTH DAILY 11/23/14   Darlin Coco, MD  gabapentin (NEURONTIN) 100 MG capsule Take 100-200 mg by mouth  3 (three) times daily as needed (pain).    Historical Provider, MD  glipiZIDE (GLUCOTROL XL) 10 MG 24 hr tablet Take 1 tablet (10 mg total) by mouth every morning. 07/10/14   Aleksei [name] V, MD  glucose blood (ONE TOUCH ULTRA TEST) test strip Use as instructed 05/18/14   Cassandria Anger, MD  Lancets (ONETOUCH ULTRASOFT)  lancets Use to check blood sugars daily 05/18/14   Cassandria Anger, MD  levothyroxine (SYNTHROID, LEVOTHROID) 150 MCG tablet Take 1 tablet (150 mcg total) by mouth daily before breakfast. 06/25/14   [name] V, MD  lisinopril (PRINIVIL,ZESTRIL) 20 MG tablet Take 1 tablet (20 mg total) by mouth every morning. 09/07/14   Aleksei [name] V, MD  metoprolol tartrate (LOPRESSOR) 50 MG tablet Take 1 tablet (50 mg total) by mouth 2 (two) times daily. 11/07/14   Rogelia Mire, NP  omeprazole (PRILOSEC) 40 MG capsule Take 1 capsule (40 mg total) by mouth daily. 10/08/14   Aleksei [name] V, MD  predniSONE (DELTASONE) 5 MG tablet Take 5 mg by mouth daily as needed (hand swelling).     Historical Provider, MD  triamcinolone cream (KENALOG) 0.1 % Apply 1 application topically 2 (two) times daily. 11/28/14   Aleksei [name] V, MD  valACYclovir (VALTREX) 1000 MG tablet Take 1 g by mouth daily. 11/28/14   Historical Provider, MD   BP 193/61 mmHg  Pulse 60  Temp(Src) 97.7 F (36.5 C) (Oral)  Resp 16  Ht 5' 11"  (1.803 m)  Wt 247 lb (112.038 kg)  BMI 34.46 kg/m2  SpO2 99% Physical Exam  Constitutional: He is oriented to person, place, and time. Vital signs are normal. He appears well-developed and well-nourished.  Non-toxic appearance. He does not appear ill. No distress.  HENT:  Head: Normocephalic and atraumatic.  Nose: Nose normal.  Mouth/Throat: Oropharynx is clear and moist. No oropharyngeal exudate.  Eyes: Conjunctivae and EOM are normal. Pupils are equal, round, and reactive to light. No scleral icterus.  Neck: Normal range of motion. Neck supple. No  tracheal deviation, no edema, no erythema and normal range of motion present. No thyroid mass and no thyromegaly present.  Cardiovascular: Normal rate, regular rhythm, S1 normal, S2 normal, intact distal pulses and normal pulses.  Exam reveals no gallop and no friction rub.   Murmur (Systolic) heard. Pulses:      Radial pulses are 2+ on the right side, and 2+ on the left side.       Dorsalis pedis pulses are 2+ on the right side, and 2+ on the left side.  Pulmonary/Chest: Effort normal and breath sounds normal. No respiratory distress. He has no wheezes. He has no rhonchi. He has no rales.  Abdominal: Soft. Normal appearance and bowel sounds are normal. He exhibits no distension, no ascites and no mass. There is no hepatosplenomegaly. There is no tenderness. There is no rebound, no guarding and no CVA tenderness.  Musculoskeletal: Normal range of motion. He exhibits no edema or tenderness.  Lymphadenopathy:    He has no cervical adenopathy.  Neurological: He is alert and oriented to person, place, and time. He has normal strength. No cranial nerve deficit or sensory deficit.  Skin: Skin is warm, dry and intact. No petechiae and no rash noted. He is not diaphoretic. No erythema. No pallor.  Psychiatric: He has a normal mood and affect. His behavior is normal. Judgment normal.  Nursing note and vitals reviewed.   ED Course  Procedures   DIAGNOSTIC STUDIES:  Oxygen Saturation is 99% on RA, normal by my interpretation.    COORDINATION OF CARE:  3:24 AM Will order zofran, CXR and labwork. Discussed treatment plan with pt at bedside and pt agreed to plan.  Labs Review Labs Reviewed - No data to display  Imaging Review No results found.   EKG Interpretation   Date/Time:  Sunday  January 20 2015 03:01:17 EDT Ventricular Rate:  60 PR Interval:  187 QRS Duration: 117 QT Interval:  472 QTC Calculation: 472 R Axis:   46 Text Interpretation:  Sinus rhythm Nonspecific intraventricular  conduction  delay Borderline repolarization abnormality No significant change since  last tracing Confirmed by Glynn Octave [phone]) on 01/20/2015  3:07:40 AM      MDM   Final diagnoses:  None    Patient presents emergency department for sudden onset nausea which began while laying down. After taking his blood pressure, is elevated at 190/100. Here in the emergency department it has spontaneously come down to 170/50. His nausea persists. He has no chest pain or shortness of breath. History is not consistent with ACS. He was given Zofran for treatment. Initial troponin is negative, EKG does not show signs of ischemia. Because patient has multiple risk factors will keep him for 3 hour troponin.    Repeat troponin negative.  No further episodes in the ED.  Will give Rx for zofran andd have him fu with PCP for further eval.  He continues to have no abdominal pain.  I doubt abdominal pathology.  He appears well and in NAD.  Repeat EKG is unchanged.  His VS remain within is normal limits and he is safe for DC.   I personally performed the services described in this documentation, which was scribed in my presence. The recorded information has been reviewed and is accurate.   Everlene Balls, MD 01/20/15 1536

## 2015-01-20 NOTE — Discharge Instructions (Signed)
Nausea, Adult Michael [name], your blood work today is normal. Take medication as needed for nausea. See a primary care physician within 3 days for close follow-up. If any symptoms worsen come back to the emergency department immediately. Thank you. Nausea means you feel sick to your stomach or need to throw up (vomit). It may be a sign of a more serious problem. If nausea gets worse, you may throw up. If you throw up a lot, you may lose too much body fluid (dehydration). HOME CARE   Get plenty of rest.  Ask your doctor how to replace body fluid losses (rehydrate).  Eat small amounts of food. Sip liquids more often.  Take all medicines as told by your doctor. GET HELP RIGHT AWAY IF:  You have a fever.  You pass out (faint).  You keep throwing up or have blood in your throw up.  You are very weak, have dry lips or a dry mouth, or you are very thirsty (dehydrated).  You have dark or bloody poop (stool).  You have very bad chest or belly (abdominal) pain.  You do not get better after 2 days, or you get worse.  You have a headache. MAKE SURE YOU:  Understand these instructions.  Will watch your condition.  Will get help right away if you are not doing well or get worse. Document Released: 07/02/2011 Document Revised: 10/05/2011 Document Reviewed: 07/02/2011 [facility] Patient Information 2015 [place], [place]. This information is not intended to replace advice given to you by your health care provider. Make sure you discuss any questions you have with your health care provider.

## 2015-01-20 NOTE — ED Notes (Signed)
Per EMS called out for nausea started an hour ago and unreadable BP on automatic cuff. EMS did manuel BP and got 190/100. Patient was recently put on glipizide- CBG 250. Denies abdominal pain, vomiting or diarrhea. Denies headaches, chest pain, blurry vision.  Mild elevation in V1 & V2 and AV block

## 2015-01-20 NOTE — ED Notes (Signed)
Dr. Oni at bedside. 

## 2015-01-21 ENCOUNTER — Other Ambulatory Visit: Payer: Self-pay

## 2015-01-21 ENCOUNTER — Telehealth: Payer: Self-pay | Admitting: Internal Medicine

## 2015-01-21 NOTE — Telephone Encounter (Signed)
Please call patient's [name] at [phone] with patient's lab results. She will be there till about 11:30

## 2015-01-21 NOTE — Telephone Encounter (Signed)
Left mess for patient to call back.  

## 2015-01-25 NOTE — Telephone Encounter (Signed)
Pt's wife informed of below.    Entered by Cassandria Anger, MD at 01/10/2015 9:21 PM    Dear Mr Ronnald Ramp,  Your diabetic control is much better! You can use Invokana if it is making you feel better.  Sincerely,  [name], MD

## 2015-02-05 ENCOUNTER — Encounter: Payer: Self-pay | Admitting: Cardiovascular Disease

## 2015-02-05 ENCOUNTER — Ambulatory Visit (INDEPENDENT_AMBULATORY_CARE_PROVIDER_SITE_OTHER): Payer: Medicare Other | Admitting: Cardiovascular Disease

## 2015-02-05 VITALS — BP 158/70 | HR 55 | Ht 71.0 in | Wt 246.1 lb

## 2015-02-05 DIAGNOSIS — I6523 Occlusion and stenosis of bilateral carotid arteries: Secondary | ICD-10-CM

## 2015-02-05 DIAGNOSIS — I771 Stricture of artery: Secondary | ICD-10-CM | POA: Diagnosis not present

## 2015-02-05 NOTE — Progress Notes (Signed)
Cardiology Office Note   Date:  02/05/2015   ID:  Michael [name] July 25, 1941, MRN [id number]  PCP:  [name], MD  Cardiologist: Darlin Coco MD   History of Present Illness: Michael [name] is a 74 y.o. male who was referred from evaluation of right subclavian artery stenosis.   Michael [name] is a 74 y.o. male with a hx of PAF, HTN, HL, diabetes, prior GI bleed (2/2 small bowel AVM). He was admitted in March 2016 with atrial fibrillation with RVR. He converted to sinus rhythm with rate control therapy. He was placed on Eliquis for anticoagulation. He had elevated troponins. This was felt to be related to demand ischemia. Echocardiogram demonstrated normal LV function. Inpatient stress testing demonstrated no ischemia.   Admitted 4/19-4/22 with chest pain in the setting of atrial fibrillation with RVR. He had evidence of vascular congestion and mild interstitial edema on chest x-ray and was given a dose of IV Lasix. He was also placed on amiodarone and converted to normal sinus rhythm. He again had elevated troponins. Cardiac catheterization demonstrated mild to moderate diagonal, circumflex and OM disease, normal LV function and borderline significant innominate artery stenosis.  The catheterization was done via the right radial artery in the patient was noted to have calcified stenosis involving the right innominate artery. The patient underwent carotid US and upper extremity arterial duplex. He has bilateral ICA stenosis 40-59%. There was evidence of more proximal obstruction in the right upper extremity based upon waveform dampening.  The patient went on to have a CT angiogram which showed  Greater than 50% stenosis of the right subclavian artery.  The patient experiences weakness of the right hand.  His blood pressure in the right arm is falsely low.     he is known to have lower blood pressure readings in the right arm. He complains of intermittent cold feeling in his right  arm with no significant associated pain. He reports chronic contraction in his right hand and has been seen by rheumatology and treated with methotrexate. These records are not available. The hand contraction has limited his use of right arm overall and he relies mostly in his left arm although he is right-handed. Other than the intermittent cold feeling in the arm, he has no  Significant claudication there. He also has no symptoms of posterior steal.  Past Medical History  Diagnosis Date  . Hypertension   . Hyperlipidemia   . Gout   . Arthritis   . Hypothyroidism   . Personal history of colonic polyps 2007, 2008    adenoma each time, largest 12 mm in 2007  . Iron deficiency anemia   . AVM (arteriovenous malformation) of colon   . Allergy   . H/O transfusion of whole blood   . PAF (paroxysmal atrial fibrillation)     a. 09/2014: Converted on Dilt;  b. CHA2DS2VASc = 3-->eliquis;  c. 09/2014 Echo: EF 55-60%, mild LVH, mildly dil LA.  [name] Elevated troponin     a. 09/2014 in setting of AF RVR-->Myoview: EF 49%, no ischemia/infarct->Med Rx.  [name] Dysrhythmia     ATRIAL FIBRILATION  . Type II diabetes mellitus     TYPE 2  . GERD (gastroesophageal reflux disease)   . Psoriasis   . CAD (coronary artery disease)     a. Cath 10/2014 - Mild to moderate diagonal, circumflex and obtuse marginal disease. Innominate artery sternosis.  . Carotid artery disease     a. Carotid dopple  03/2014 65-79% RICA &  03-83% LICA stenosis b. 3/38  . History of PFTs     PFTs 6/16:  FVC 3.73 (87%), FEV1 2.93 (88%), FEV1/FVC 78%, DLCO 69%    Past Surgical History  Procedure Laterality Date  . Rotator cuff repair      left  . Knee arthroscopy      right  . Colonoscopy w/ polypectomy  04/09/2006    12 mm adenoma  . Colonoscopy w/ polypectomy  06/17/2007    5 mm adenoma  . Colonoscopy  02/10/2011    internal hemorrhoids  . Esophagogastroduodenoscopy  12/23/2011    Procedure: ESOPHAGOGASTRODUODENOSCOPY (EGD);  Surgeon:  Irene Shipper, MD;  Location: [facility];  Service: Endoscopy;  Laterality: N/A;  with small bowel bx's  . Givens capsule study  12/28/2011  . Laparoscopic appendectomy N/A 02/09/2014    Procedure: APPENDECTOMY LAPAROSCOPIC;  Surgeon: Leighton Ruff, MD;  Location: [facility];  Service: General;  Laterality: N/A;  . Appendectomy  02/09/2014  . Left heart catheterization with coronary angiogram N/A 11/15/2014    Procedure: LEFT HEART CATHETERIZATION WITH CORONARY ANGIOGRAM;  Surgeon: Belva Crome, MD;  Location: [facility] CATH LAB;  Service: Cardiovascular;  Laterality: N/A;     Current Outpatient Prescriptions  Medication Sig Dispense Refill  . acetaminophen (TYLENOL) 650 MG CR tablet Take 1,300 mg by mouth every 8 (eight) hours as needed for pain.     [name] allopurinol (ZYLOPRIM) 100 MG tablet TAKE 2 TABLETS BY MOUTH DAILY 60 tablet 11  . amiodarone (PACERONE) 200 MG tablet Take 1 tablet (200 mg total) by mouth daily. 30 tablet 6  . apixaban (ELIQUIS) 5 MG TABS tablet Take 1 tablet (5 mg total) by mouth 2 (two) times daily. 60 tablet 10  . atorvastatin (LIPITOR) 40 MG tablet Take 1 tablet (40 mg total) by mouth daily. 30 tablet 6  . Blood Glucose Monitoring Suppl (ONE TOUCH ULTRA 2) W/DEVICE KIT Use as directed Dx E11.9 1 each 0  . canagliflozin (INVOKANA) 100 MG TABS tablet Take 1 tablet (100 mg total) by mouth daily. 30 tablet 11  . CARTIA XT 120 MG 24 hr capsule TAke 1 capsule by mouth once daily  11  . Cholecalciferol 1000 UNITS TBDP Take 1,000 Units by mouth daily.    . clobetasol (TEMOVATE) 0.05 % external solution Apply 1 application topically 2 (two) times daily. Apply to scalp    . diltiazem (CARDIZEM CD) 180 MG 24 hr capsule Take 1 capsule (180 mg total) by mouth daily. 30 capsule 11  . ergocalciferol (VITAMIN D2) 50000 UNITS capsule Take 1 capsule (50,000 Units total) by mouth once a week. 6 capsule 0  . fenofibrate 160 MG tablet Take 1 tablet (160 mg total) by mouth daily. 30 tablet 6  . ferrous  sulfate 325 (65 FE) MG tablet Take 325 mg by mouth 3 (three) times daily with meals.    . furosemide (LASIX) 20 MG tablet TAKE 1 TABLET(20 MG) BY MOUTH DAILY 90 tablet 0  . gabapentin (NEURONTIN) 100 MG capsule Take 100-200 mg by mouth 3 (three) times daily as needed (pain).    [name] glipiZIDE (GLUCOTROL XL) 10 MG 24 hr tablet Take 1 tablet (10 mg total) by mouth every morning. 90 tablet 3  . glucose blood (ONE TOUCH ULTRA TEST) test strip Use as instructed 30 each 11  . Lancets (ONETOUCH ULTRASOFT) lancets Use to check blood sugars daily 30 each 11  . levothyroxine (SYNTHROID, LEVOTHROID) 150 MCG tablet Take  1 tablet (150 mcg total) by mouth daily before breakfast. 90 tablet 2  . lisinopril (PRINIVIL,ZESTRIL) 20 MG tablet Take 1 tablet (20 mg total) by mouth every morning. 90 tablet 3  . metoprolol tartrate (LOPRESSOR) 50 MG tablet Take 1 tablet (50 mg total) by mouth 2 (two) times daily. 60 tablet 6  . omeprazole (PRILOSEC) 40 MG capsule Take 1 capsule (40 mg total) by mouth daily. 90 capsule 3  . ondansetron (ZOFRAN ODT) 4 MG disintegrating tablet Take 1 tablet (4 mg total) by mouth every 8 (eight) hours as needed for nausea or vomiting. 12 tablet 0  . predniSONE (DELTASONE) 5 MG tablet Take 5 mg by mouth daily as needed (hand swelling).     . triamcinolone cream (KENALOG) 0.1 % Apply 1 application topically 2 (two) times daily. 80 g 3  . valACYclovir (VALTREX) 1000 MG tablet Take 1 g by mouth daily.  0   No current facility-administered medications for this visit.    Allergies:   Percocet    Social History:  The patient  reports that he quit smoking about 8 years ago. He has never used smokeless tobacco. He reports that he drinks about 0.6 oz of alcohol per week. He reports that he does not use illicit drugs.   Family History:  The patient's family history includes Cancer in his brother; Diabetes in his father; Heart attack in his father; Heart disease in his brother; Hypertension in his  father and mother; Lung cancer in his father; Stroke in his father and mother. There is no history of Malignant hyperthermia.    ROS:  Please see the history of present illness.   Otherwise, review of systems are positive for none.   All other systems are reviewed and negative.    PHYSICAL EXAM: VS:  There were no vitals taken for this visit. , BMI There is no weight on file to calculate BMI. GEN: Well nourished, well developed, in no acute distress HEENT: normal Neck: Bilateral carotid bruits.  There is a bruit over the right subclavian artery. Cardiac: RRR; no murmurs, rubs, or gallops,no edema.   the right radial pulse is significantly weaker than the left radial pulse Respiratory:  clear to auscultation bilaterally, normal work of breathing GI: soft, nontender, nondistended, + BS MS: no deformity or atrophy Skin: warm and dry, no rash Neuro:  Strength and sensation are intact Psych: euthymic mood, full affect   vascular: radial pulses +1 on the right side and +2 on the left side. Femoral pulse is normal bilaterally and distal pulses are palpable.   EKG:  EKG is not ordered today.    Recent Labs: 10/11/2014: Magnesium 2.0 11/13/2014: B Natriuretic Peptide 502.4* 01/16/2015: TSH 2.34 01/20/2015: ALT 19; BUN 20; Creatinine, Ser 1.18; Hemoglobin 12.1*; Platelets 171; Potassium 4.6; Sodium 137    Lipid Panel    Component Value Date/Time   CHOL 165 10/12/2014 0248   TRIG 792* 10/12/2014 0248   HDL 22* 10/12/2014 0248   CHOLHDL 7.5 10/12/2014 0248   VLDL UNABLE TO CALCULATE IF TRIGLYCERIDE OVER 400 mg/dL 10/12/2014 0248   LDLCALC UNABLE TO CALCULATE IF TRIGLYCERIDE OVER 400 mg/dL 10/12/2014 0248   LDLDIRECT 77.2 03/19/2014 1132      Wt Readings from Last 3 Encounters:  01/20/15 247 lb (112.038 kg)  01/16/15 247 lb 12.8 oz (112.401 kg)  01/10/15 245 lb 4 oz (111.245 kg)      Other studies Reviewed: Additional studies/ records that were reviewed today include: Recent  consultation with [facility]. Review of the above records demonstrates: Gemfibrozil was changed to fibrillate and Lipitor was added.  So far the patient is not having any myalgias from his lipid therapy   ASSESSMENT AND PLAN:   Brachiocephalic artery stenosis, right  I reviewed the cardiac catheterization images. There is significant stenosis affecting the right innominate artery which is heavily calcified with post stenotic dilatation. Symptoms  Include intermittent cold feeling in the arm but no clear claudication. The contraction in his hand has limited his use of his right arm which might explain the lack of significant pain. The etiology of the hand contraction is not entirely clear to me. I requested records from rheumatology as this might affect the decision whether to proceed with revascularization or not. Based on the images, the procedure is likely associated with a higher risk of embolization than average. Thus, I do not recommend revascularization unless he has significant limiting symptoms. I will reevaluate him in few months after reviewing his records.  PAF (paroxysmal atrial fibrillation)  Maintaining NSR. He is tolerating Eliquis. He denies any bleeding problems. He remains on amiodarone.   Chronic diastolic CHF (congestive heart failure) Volume appears stable on low-dose Lasix.  Coronary artery disease  No angina.    Carotid stenosis, bilateral  Recent carotid US demonstrates bilateral ICA 40-59%. Repeat carotid US will be obtained in one year.   Signed, Kathlyn Sacramento, MD   02/05/2015 9:05 AM    [facility] [place], [place], [place]  [address] Phone: [phone]; Fax: [phone]

## 2015-02-05 NOTE — Patient Instructions (Signed)
Medication Instructions:  Your physician recommends that you continue on your current medications as directed. Please refer to the Current Medication list given to you today.   Labwork: none  Testing/Procedures: none  Follow-Up: Your physician wants you to follow-up in: 3 months.  You will receive a reminder letter in the mail two months in advance. If you don't receive a letter, please call our office to schedule the follow-up appointment.

## 2015-03-08 ENCOUNTER — Encounter: Payer: Self-pay | Admitting: Internal Medicine

## 2015-03-08 ENCOUNTER — Other Ambulatory Visit (INDEPENDENT_AMBULATORY_CARE_PROVIDER_SITE_OTHER): Payer: Medicare Other

## 2015-03-08 ENCOUNTER — Ambulatory Visit (INDEPENDENT_AMBULATORY_CARE_PROVIDER_SITE_OTHER)
Admission: RE | Admit: 2015-03-08 | Discharge: 2015-03-08 | Disposition: A | Discharge status: Home / Self Care | Payer: Medicare Other | Source: Ambulatory Visit | Attending: Internal Medicine | Admitting: Internal Medicine

## 2015-03-08 ENCOUNTER — Ambulatory Visit (INDEPENDENT_AMBULATORY_CARE_PROVIDER_SITE_OTHER): Payer: Medicare Other | Admitting: Internal Medicine

## 2015-03-08 VITALS — BP 148/68 | HR 59 | Temp 98.5°F | Wt 253.0 lb

## 2015-03-08 DIAGNOSIS — E0829 Diabetes mellitus due to underlying condition with other diabetic kidney complication: Secondary | ICD-10-CM | POA: Diagnosis not present

## 2015-03-08 DIAGNOSIS — S3992XA Unspecified injury of lower back, initial encounter: Secondary | ICD-10-CM | POA: Diagnosis not present

## 2015-03-08 DIAGNOSIS — S299XXA Unspecified injury of thorax, initial encounter: Secondary | ICD-10-CM | POA: Diagnosis not present

## 2015-03-08 DIAGNOSIS — N318 Other neuromuscular dysfunction of bladder: Secondary | ICD-10-CM

## 2015-03-08 DIAGNOSIS — M545 Low back pain, unspecified: Secondary | ICD-10-CM | POA: Insufficient documentation

## 2015-03-08 DIAGNOSIS — N23 Unspecified renal colic: Secondary | ICD-10-CM | POA: Diagnosis not present

## 2015-03-08 DIAGNOSIS — M549 Dorsalgia, unspecified: Secondary | ICD-10-CM | POA: Diagnosis not present

## 2015-03-08 DIAGNOSIS — M546 Pain in thoracic spine: Secondary | ICD-10-CM | POA: Diagnosis not present

## 2015-03-08 DIAGNOSIS — I6523 Occlusion and stenosis of bilateral carotid arteries: Secondary | ICD-10-CM | POA: Diagnosis not present

## 2015-03-08 LAB — POCT URINALYSIS DIPSTICK
Bilirubin, UA: NEGATIVE
Blood, UA: NEGATIVE
KETONES UA: NEGATIVE
Leukocytes, UA: NEGATIVE
Nitrite, UA: NEGATIVE
PH UA: 6
Spec Grav, UA: >=1.03
Urobilinogen, UA: NEGATIVE

## 2015-03-08 LAB — BASIC METABOLIC PANEL
BUN: 20 mg/dL (ref 6–23)
CALCIUM: 9.1 mg/dL (ref 8.4–10.5)
CO2: 29 mEq/L (ref 19–32)
CREATININE: 1.26 mg/dL (ref 0.40–1.50)
Chloride: 103 mEq/L (ref 96–112)
GFR: 59.72 mL/min — AB (ref >60.00)
GLUCOSE: 236 mg/dL — AB (ref 70–99)
Potassium: 4.3 mEq/L (ref 3.5–5.1)
Sodium: 137 mEq/L (ref 135–145)

## 2015-03-08 LAB — TSH: TSH: 2.37 u[IU]/mL (ref 0.35–4.50)

## 2015-03-08 LAB — HEMOGLOBIN A1C: Hgb A1c MFr Bld: 6.6 % — ABNORMAL HIGH (ref 4.6–6.5)

## 2015-03-08 MED ORDER — GLUCOSE BLOOD VI STRP: ORAL_STRIP | Status: DC

## 2015-03-08 MED ORDER — HYDROCODONE-ACETAMINOPHEN 5-325 MG PO TABS: 1.0000 | ORAL_TABLET | Freq: Two times a day (BID) | ORAL | Status: DC | PRN

## 2015-03-08 NOTE — Assessment & Plan Note (Signed)
UA Treat DM

## 2015-03-08 NOTE — Progress Notes (Signed)
Pre visit review using our clinic review tool, if applicable. No additional management support is needed unless otherwise documented below in the visit note. 

## 2015-03-08 NOTE — Assessment & Plan Note (Signed)
lower thoracic and upper lumbar back pain x 4-5 d, severe X-rays UA Norco prn

## 2015-03-08 NOTE — Assessment & Plan Note (Signed)
Poor control Metformin, Glipizide

## 2015-03-09 DIAGNOSIS — E119 Type 2 diabetes mellitus without complications: Secondary | ICD-10-CM | POA: Diagnosis not present

## 2015-03-10 ENCOUNTER — Encounter: Payer: Self-pay | Admitting: Internal Medicine

## 2015-03-10 NOTE — Progress Notes (Signed)
Subjective:  Patient ID: Michael [name], male    DOB: Nov 07, 1940  Age: 74 y.o. MRN: [id number]  CC: No chief complaint on file.   HPI QUINCEY QUESINBERRY presents for severe new LBP, urinary frequency - not new; elevated sugars...  Outpatient Prescriptions Prior to Visit  Medication Sig Dispense Refill  . acetaminophen (TYLENOL) 650 MG CR tablet Take 1,300 mg by mouth every 8 (eight) hours as needed for pain.     [name] allopurinol (ZYLOPRIM) 100 MG tablet TAKE 2 TABLETS BY MOUTH DAILY 60 tablet 11  . amiodarone (PACERONE) 200 MG tablet Take 1 tablet (200 mg total) by mouth daily. 30 tablet 6  . apixaban (ELIQUIS) 5 MG TABS tablet Take 1 tablet (5 mg total) by mouth 2 (two) times daily. 60 tablet 10  . atorvastatin (LIPITOR) 40 MG tablet Take 1 tablet (40 mg total) by mouth daily. 30 tablet 6  . Blood Glucose Monitoring Suppl (ONE TOUCH ULTRA 2) W/DEVICE KIT Use as directed Dx E11.9 1 each 0  . Cholecalciferol 1000 UNITS TBDP Take 1,000 Units by mouth daily.    . clobetasol (TEMOVATE) 0.05 % external solution Apply 1 application topically 2 (two) times daily. Apply to scalp    . diltiazem (CARDIZEM CD) 180 MG 24 hr capsule Take 1 capsule (180 mg total) by mouth daily. 30 capsule 11  . ergocalciferol (VITAMIN D2) 50000 UNITS capsule Take 1 capsule (50,000 Units total) by mouth once a week. 6 capsule 0  . fenofibrate 160 MG tablet Take 1 tablet (160 mg total) by mouth daily. 30 tablet 6  . ferrous sulfate 325 (65 FE) MG tablet Take 325 mg by mouth 3 (three) times daily with meals.    . furosemide (LASIX) 20 MG tablet TAKE 1 TABLET(20 MG) BY MOUTH DAILY 90 tablet 0  . gabapentin (NEURONTIN) 100 MG capsule Take 100-200 mg by mouth 3 (three) times daily as needed (pain).    . Lancets (ONETOUCH ULTRASOFT) lancets Use to check blood sugars daily 30 each 11  . levothyroxine (SYNTHROID, LEVOTHROID) 150 MCG tablet Take 1 tablet (150 mcg total) by mouth daily before breakfast. 90 tablet 2  . lisinopril  (PRINIVIL,ZESTRIL) 20 MG tablet Take 1 tablet (20 mg total) by mouth every morning. 90 tablet 3  . metoprolol tartrate (LOPRESSOR) 50 MG tablet Take 1 tablet (50 mg total) by mouth 2 (two) times daily. 60 tablet 6  . omeprazole (PRILOSEC) 40 MG capsule Take 1 capsule (40 mg total) by mouth daily. 90 capsule 3  . predniSONE (DELTASONE) 5 MG tablet Take 5 mg by mouth daily as needed (hand swelling).     . triamcinolone cream (KENALOG) 0.1 % Apply 1 application topically 2 (two) times daily. 80 g 3  . valACYclovir (VALTREX) 1000 MG tablet Take 1 g by mouth daily.  0  . glipiZIDE (GLUCOTROL XL) 10 MG 24 hr tablet Take 1 tablet (10 mg total) by mouth every morning. 90 tablet 3  . glucose blood (ONE TOUCH ULTRA TEST) test strip Use as instructed 30 each 11  . canagliflozin (INVOKANA) 100 MG TABS tablet Take 1 tablet (100 mg total) by mouth daily. (Patient not taking: Reported on 03/08/2015) 30 tablet 11   No facility-administered medications prior to visit.    ROS Review of Systems  Constitutional: Negative for appetite change, fatigue and unexpected weight change.  HENT: Negative for congestion, nosebleeds, sneezing, sore throat and trouble swallowing.   Eyes: Negative for itching and visual disturbance.  Respiratory: Negative for cough.   Cardiovascular: Negative for chest pain, palpitations and leg swelling.  Gastrointestinal: Negative for nausea, diarrhea, blood in stool and abdominal distention.  Genitourinary: Positive for urgency and frequency. Negative for hematuria.  Musculoskeletal: Positive for back pain and arthralgias. Negative for joint swelling, gait problem and neck pain.  Skin: Negative for rash.  Neurological: Negative for dizziness, tremors, speech difficulty and weakness.  Psychiatric/Behavioral: Negative for sleep disturbance, dysphoric mood and agitation. The patient is not nervous/anxious.     Objective:  BP 148/68 mmHg  Pulse 59  Temp(Src) 98.5 F (36.9 C) (Oral)  Wt  253 lb (114.76 kg)  SpO2 98%  BP Readings from Last 3 Encounters:  03/08/15 148/68  02/05/15 158/70  01/20/15 143/62    Wt Readings from Last 3 Encounters:  03/08/15 253 lb (114.76 kg)  02/05/15 246 lb 1.9 oz (111.639 kg)  01/20/15 247 lb (112.038 kg)    Physical Exam  Lab Results  Component Value Date   WBC 5.5 01/20/2015   HGB 12.1* 01/20/2015   HCT 34.7* 01/20/2015   PLT 171 01/20/2015   GLUCOSE 236* 03/08/2015   CHOL 165 10/12/2014   TRIG 792* 10/12/2014   HDL 22* 10/12/2014   LDLDIRECT 77.2 03/19/2014   LDLCALC UNABLE TO CALCULATE IF TRIGLYCERIDE OVER 400 mg/dL 10/12/2014   ALT 19 01/20/2015   AST 45* 01/20/2015   NA 137 03/08/2015   K 4.3 03/08/2015   CL 103 03/08/2015   CREATININE 1.26 03/08/2015   BUN 20 03/08/2015   CO2 29 03/08/2015   TSH 2.37 03/08/2015   PSA 0.50 11/07/2013   INR 1.17 01/20/2015   HGBA1C 6.6* 03/08/2015   MICROALBUR 1.2 05/16/2013    Dg Thoracic Spine 2 View  03/08/2015   CLINICAL DATA:  Pain for 3 days.  Prior fall.  Initial evaluation.  EXAM: THORACIC SPINE 2 VIEWS  COMPARISON:  None.  FINDINGS: Paraspinal soft tissues are normal. No acute bony abnormality identified. Normal alignment. Diffuse degenerative change thoracic spine.  IMPRESSION: Diffuse degenerative change noted of the thoracic spine. No acute abnormality.   Electronically Signed   By: Marcello Moores  Register   On: 03/08/2015 14:07   Dg Lumbar Spine 2-3 Views  03/08/2015   CLINICAL DATA:  Fall.  Back pain.  Initial evaluation.  EXAM: LUMBAR SPINE - 2-3 VIEW  COMPARISON:  CT 02/09/2014.  FINDINGS: Gallstones. Diffuse multilevel degenerative change. No acute abnormality identified. No evidence of fracture. Normal alignment mineralization. Aortoiliac atherosclerotic vascular disease.  IMPRESSION: 1. Diffuse degenerative change.  No acute abnormality. 2. Gallstones. 3. Aortoiliac atherosclerotic vascular disease.   Electronically Signed   By: Marcello Moores  Register   On: 03/08/2015 14:09     Assessment & Plan:   Diagnoses and all orders for this visit:  Midline low back pain without sciatica -     DG Thoracic Spine 2 View; Future -     DG Lumbar Spine 2-3 Views; Future -     Basic metabolic panel; Future -     TSH; Future -     Hemoglobin A1c; Future  Kidney pain -     POCT urinalysis dipstick -     Basic metabolic panel; Future -     TSH; Future -     Hemoglobin A1c; Future  Diabetes mellitus due to underlying condition with other diabetic kidney complication -     Basic metabolic panel; Future -     TSH; Future -     Hemoglobin A1c;  Future  Frequency-urgency syndrome -     Basic metabolic panel; Future -     TSH; Future -     Hemoglobin A1c; Future  Other orders -     glucose blood (ONE TOUCH ULTRA TEST) test strip; Use as instructed qid prn -     HYDROcodone-acetaminophen (NORCO/VICODIN) 5-325 MG per tablet; Take 1 tablet by mouth 2 (two) times daily as needed for moderate pain or severe pain.   I have changed Mr. Wolk glucose blood. I am also having him start on HYDROcodone-acetaminophen. Additionally, I am having him maintain his acetaminophen, predniSONE, ferrous sulfate, ergocalciferol, ONE TOUCH ULTRA 2, onetouch ultrasoft, levothyroxine, lisinopril, omeprazole, Cholecalciferol, clobetasol, apixaban, metoprolol, amiodarone, diltiazem, furosemide, triamcinolone cream, fenofibrate, atorvastatin, canagliflozin, valACYclovir, gabapentin, allopurinol, CARTIA XT, and glipiZIDE.  Meds ordered this encounter  Medications  . CARTIA XT 120 MG 24 hr capsule    Sig: TK 1 C PO QD    Refill:  11  . glipiZIDE (GLUCOTROL) 10 MG tablet    Sig: TAKE ONE TABLET PO ONCE DAILY IN THE MORNING.    Refill:  10  . glucose blood (ONE TOUCH ULTRA TEST) test strip    Sig: Use as instructed qid prn    Dispense:  100 each    Refill:  11    Use to check blood sugars daily Dx E11.9  . HYDROcodone-acetaminophen (NORCO/VICODIN) 5-325 MG per tablet    Sig: Take 1 tablet by  mouth 2 (two) times daily as needed for moderate pain or severe pain.    Dispense:  100 tablet    Refill:  0     Follow-up: Return in about 4 weeks (around 04/05/2015) for a follow-up visit.  [name], MD

## 2015-03-11 ENCOUNTER — Other Ambulatory Visit: Payer: Medicare Other

## 2015-03-11 DIAGNOSIS — H34811 Central retinal vein occlusion, right eye: Secondary | ICD-10-CM | POA: Diagnosis not present

## 2015-03-11 DIAGNOSIS — E119 Type 2 diabetes mellitus without complications: Secondary | ICD-10-CM | POA: Diagnosis not present

## 2015-03-11 DIAGNOSIS — H43811 Vitreous degeneration, right eye: Secondary | ICD-10-CM | POA: Diagnosis not present

## 2015-03-11 DIAGNOSIS — H3581 Retinal edema: Secondary | ICD-10-CM | POA: Diagnosis not present

## 2015-03-12 ENCOUNTER — Ambulatory Visit: Payer: Medicare Other | Admitting: Internal Medicine

## 2015-03-20 ENCOUNTER — Other Ambulatory Visit (INDEPENDENT_AMBULATORY_CARE_PROVIDER_SITE_OTHER): Payer: Medicare Other | Admitting: *Deleted

## 2015-03-20 DIAGNOSIS — E781 Pure hyperglyceridemia: Secondary | ICD-10-CM

## 2015-03-20 LAB — LIPID PANEL
CHOL/HDL RATIO: 4
Cholesterol: 100 mg/dL (ref 0–200)
HDL: 22.3 mg/dL — AB (ref >39.00)
Triglycerides: 795 mg/dL — ABNORMAL HIGH (ref 0.0–149.0)

## 2015-03-20 LAB — HEPATIC FUNCTION PANEL
ALBUMIN: 4.3 g/dL (ref 3.5–5.2)
ALK PHOS: 42 U/L (ref 39–117)
ALT: 15 U/L (ref 0–53)
AST: 18 U/L (ref 0–37)
BILIRUBIN TOTAL: 0.5 mg/dL (ref 0.2–1.2)
Bilirubin, Direct: 0.1 mg/dL (ref 0.0–0.3)
Total Protein: 7 g/dL (ref 6.0–8.3)

## 2015-03-20 LAB — LDL CHOLESTEROL, DIRECT: Direct LDL: 26 mg/dL

## 2015-03-22 ENCOUNTER — Telehealth: Payer: Self-pay | Admitting: Internal Medicine

## 2015-03-22 DIAGNOSIS — L409 Psoriasis, unspecified: Secondary | ICD-10-CM

## 2015-03-22 NOTE — Telephone Encounter (Signed)
Patient need an appointment to see a dermatologist for psoriasis, it's on hands and face, please advise

## 2015-03-25 NOTE — Telephone Encounter (Signed)
Ok Thx 

## 2015-04-02 ENCOUNTER — Other Ambulatory Visit: Payer: Self-pay | Admitting: *Deleted

## 2015-04-02 MED ORDER — LEVOTHYROXINE SODIUM 150 MCG PO TABS: 150.0000 ug | ORAL_TABLET | Freq: Every day | ORAL | Status: DC

## 2015-04-05 ENCOUNTER — Ambulatory Visit (INDEPENDENT_AMBULATORY_CARE_PROVIDER_SITE_OTHER): Payer: Medicare Other | Admitting: Internal Medicine

## 2015-04-05 ENCOUNTER — Encounter: Payer: Self-pay | Admitting: Internal Medicine

## 2015-04-05 VITALS — BP 139/72 | HR 55 | Wt 250.0 lb

## 2015-04-05 DIAGNOSIS — E0922 Drug or chemical induced diabetes mellitus with diabetic chronic kidney disease: Secondary | ICD-10-CM

## 2015-04-05 DIAGNOSIS — M5440 Lumbago with sciatica, unspecified side: Secondary | ICD-10-CM

## 2015-04-05 DIAGNOSIS — I10 Essential (primary) hypertension: Secondary | ICD-10-CM

## 2015-04-05 DIAGNOSIS — I6523 Occlusion and stenosis of bilateral carotid arteries: Secondary | ICD-10-CM | POA: Diagnosis not present

## 2015-04-05 DIAGNOSIS — I480 Paroxysmal atrial fibrillation: Secondary | ICD-10-CM | POA: Diagnosis not present

## 2015-04-05 MED ORDER — HYDROCODONE-ACETAMINOPHEN 5-325 MG PO TABS
1.0000 | ORAL_TABLET | Freq: Two times a day (BID) | ORAL | Status: DC | PRN
Start: 2015-04-05 — End: 2015-12-16

## 2015-04-05 NOTE — Assessment & Plan Note (Signed)
Norco prn  Potential benefits of a long term opioids use as well as potential risks (i.e. addiction risk, apnea etc) and complications (i.e. Somnolence, constipation and others) were explained to the patient and were aknowledged. 

## 2015-04-05 NOTE — Progress Notes (Signed)
Subjective:  Patient ID: Michael [name], male    DOB: [birth year]  Age: 74 y.o. MRN: [id number]  CC: No chief complaint on file.   HPI TRASK VOSLER presents for a LBP f/u - it is 4/10 or higher; overall better. C/o urinary frequency w/Invokana. F/u DM. CBG is 300s. A1c was 6.6%  Outpatient Prescriptions Prior to Visit  Medication Sig Dispense Refill  . acetaminophen (TYLENOL) 650 MG CR tablet Take 1,300 mg by mouth every 8 (eight) hours as needed for pain.     [name] allopurinol (ZYLOPRIM) 100 MG tablet TAKE 2 TABLETS BY MOUTH DAILY 60 tablet 11  . amiodarone (PACERONE) 200 MG tablet Take 1 tablet (200 mg total) by mouth daily. 30 tablet 6  . apixaban (ELIQUIS) 5 MG TABS tablet Take 1 tablet (5 mg total) by mouth 2 (two) times daily. 60 tablet 10  . atorvastatin (LIPITOR) 40 MG tablet Take 1 tablet (40 mg total) by mouth daily. 30 tablet 6  . Blood Glucose Monitoring Suppl (ONE TOUCH ULTRA 2) W/DEVICE KIT Use as directed Dx E11.9 1 each 0  . canagliflozin (INVOKANA) 100 MG TABS tablet Take 1 tablet (100 mg total) by mouth daily. 30 tablet 11  . Cholecalciferol 1000 UNITS TBDP Take 1,000 Units by mouth daily.    . clobetasol (TEMOVATE) 0.05 % external solution Apply 1 application topically 2 (two) times daily. Apply to scalp    . diltiazem (CARDIZEM CD) 180 MG 24 hr capsule Take 1 capsule (180 mg total) by mouth daily. 30 capsule 11  . ergocalciferol (VITAMIN D2) 50000 UNITS capsule Take 1 capsule (50,000 Units total) by mouth once a week. 6 capsule 0  . fenofibrate 160 MG tablet Take 1 tablet (160 mg total) by mouth daily. 30 tablet 6  . ferrous sulfate 325 (65 FE) MG tablet Take 325 mg by mouth 3 (three) times daily with meals.    . furosemide (LASIX) 20 MG tablet TAKE 1 TABLET(20 MG) BY MOUTH DAILY 90 tablet 0  . gabapentin (NEURONTIN) 100 MG capsule Take 100-200 mg by mouth 3 (three) times daily as needed (pain).    [name] glipiZIDE (GLUCOTROL) 10 MG tablet TAKE ONE TABLET PO ONCE DAILY IN THE  MORNING.  10  . glucose blood (ONE TOUCH ULTRA TEST) test strip Use as instructed qid prn 100 each 11  . Lancets (ONETOUCH ULTRASOFT) lancets Use to check blood sugars daily 30 each 11  . levothyroxine (SYNTHROID, LEVOTHROID) 150 MCG tablet Take 1 tablet (150 mcg total) by mouth daily before breakfast. 90 tablet 3  . lisinopril (PRINIVIL,ZESTRIL) 20 MG tablet Take 1 tablet (20 mg total) by mouth every morning. 90 tablet 3  . metoprolol tartrate (LOPRESSOR) 50 MG tablet Take 1 tablet (50 mg total) by mouth 2 (two) times daily. 60 tablet 6  . omeprazole (PRILOSEC) 40 MG capsule Take 1 capsule (40 mg total) by mouth daily. 90 capsule 3  . predniSONE (DELTASONE) 5 MG tablet Take 5 mg by mouth daily as needed (hand swelling).     . triamcinolone cream (KENALOG) 0.1 % Apply 1 application topically 2 (two) times daily. 80 g 3  . valACYclovir (VALTREX) 1000 MG tablet Take 1 g by mouth daily.  0  . HYDROcodone-acetaminophen (NORCO/VICODIN) 5-325 MG per tablet Take 1 tablet by mouth 2 (two) times daily as needed for moderate pain or severe pain. 100 tablet 0  . CARTIA XT 120 MG 24 hr capsule TK 1 C PO QD  11  No facility-administered medications prior to visit.    ROS Review of Systems  Constitutional: Negative for appetite change, fatigue and unexpected weight change.  HENT: Negative for congestion, nosebleeds, sneezing, sore throat and trouble swallowing.   Eyes: Negative for itching and visual disturbance.  Respiratory: Negative for cough.   Cardiovascular: Negative for chest pain, palpitations and leg swelling.  Gastrointestinal: Negative for nausea, diarrhea, blood in stool and abdominal distention.  Genitourinary: Positive for urgency and frequency. Negative for hematuria.  Musculoskeletal: Positive for back pain, arthralgias and gait problem. Negative for joint swelling and neck pain.  Skin: Negative for rash.  Neurological: Negative for dizziness, tremors, speech difficulty and weakness.    Psychiatric/Behavioral: Negative for suicidal ideas, sleep disturbance, dysphoric mood and agitation. The patient is not nervous/anxious.     Objective:  BP 139/72 mmHg  Pulse 55  Wt 250 lb (113.399 kg)  SpO2 97%  BP Readings from Last 3 Encounters:  04/05/15 139/72  03/08/15 148/68  02/05/15 158/70    Wt Readings from Last 3 Encounters:  04/05/15 250 lb (113.399 kg)  03/08/15 253 lb (114.76 kg)  02/05/15 246 lb 1.9 oz (111.639 kg)    Physical Exam  Constitutional: He is oriented to person, place, and time. He appears well-developed. No distress.  NAD  HENT:  Mouth/Throat: Oropharynx is clear and moist.  Eyes: Conjunctivae are normal. Pupils are equal, round, and reactive to light.  Neck: Normal range of motion. No JVD present. No thyromegaly present.  Cardiovascular: Normal rate, regular rhythm, normal heart sounds and intact distal pulses.  Exam reveals no gallop and no friction rub.   No murmur heard. Pulmonary/Chest: Effort normal and breath sounds normal. No respiratory distress. He has no wheezes. He has no rales. He exhibits no tenderness.  Abdominal: Soft. Bowel sounds are normal. He exhibits no distension and no mass. There is no tenderness. There is no rebound and no guarding.  Musculoskeletal: Normal range of motion. He exhibits no edema or tenderness.  Lymphadenopathy:    He has no cervical adenopathy.  Neurological: He is alert and oriented to person, place, and time. He has normal reflexes. No cranial nerve deficit. He exhibits normal muscle tone. He displays a negative Romberg sign. Coordination and gait normal.  Skin: Skin is warm and dry. No rash noted.  Psychiatric: He has a normal mood and affect. His behavior is normal. Judgment and thought content normal.  LS is tender  Lab Results  Component Value Date   WBC 5.5 01/20/2015   HGB 12.1* 01/20/2015   HCT 34.7* 01/20/2015   PLT 171 01/20/2015   GLUCOSE 236* 03/08/2015   CHOL 100 03/20/2015   TRIG *  03/20/2015    795.0 Triglyceride is over 400; calculations on Lipids are invalid.   HDL 22.30* 03/20/2015   LDLDIRECT 26.0 03/20/2015   LDLCALC UNABLE TO CALCULATE IF TRIGLYCERIDE OVER 400 mg/dL 10/12/2014   ALT 15 03/20/2015   AST 18 03/20/2015   NA 137 03/08/2015   K 4.3 03/08/2015   CL 103 03/08/2015   CREATININE 1.26 03/08/2015   BUN 20 03/08/2015   CO2 29 03/08/2015   TSH 2.37 03/08/2015   PSA 0.50 11/07/2013   INR 1.17 01/20/2015   HGBA1C 6.6* 03/08/2015   MICROALBUR 1.2 05/16/2013    Dg Thoracic Spine 2 View  03/08/2015   CLINICAL DATA:  Pain for 3 days.  Prior fall.  Initial evaluation.  EXAM: THORACIC SPINE 2 VIEWS  COMPARISON:  None.  FINDINGS: Paraspinal  soft tissues are normal. No acute bony abnormality identified. Normal alignment. Diffuse degenerative change thoracic spine.  IMPRESSION: Diffuse degenerative change noted of the thoracic spine. No acute abnormality.   Electronically Signed   By: Marcello Moores  Register   On: 03/08/2015 14:07   Dg Lumbar Spine 2-3 Views  03/08/2015   CLINICAL DATA:  Fall.  Back pain.  Initial evaluation.  EXAM: LUMBAR SPINE - 2-3 VIEW  COMPARISON:  CT 02/09/2014.  FINDINGS: Gallstones. Diffuse multilevel degenerative change. No acute abnormality identified. No evidence of fracture. Normal alignment mineralization. Aortoiliac atherosclerotic vascular disease.  IMPRESSION: 1. Diffuse degenerative change.  No acute abnormality. 2. Gallstones. 3. Aortoiliac atherosclerotic vascular disease.   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  I am having Mr. Lia maintain his acetaminophen, predniSONE, ferrous sulfate, ergocalciferol, ONE TOUCH ULTRA 2, onetouch ultrasoft, lisinopril, omeprazole, Cholecalciferol, clobetasol, apixaban, metoprolol, amiodarone, diltiazem, furosemide, triamcinolone cream, fenofibrate, atorvastatin, canagliflozin, valACYclovir, gabapentin, allopurinol, CARTIA XT, glipiZIDE, glucose blood, levothyroxine, and HYDROcodone-acetaminophen.  Meds ordered this encounter  Medications  . HYDROcodone-acetaminophen (NORCO/VICODIN) 5-325 MG per tablet    Sig: Take 1 tablet by mouth 2 (two) times daily as needed for moderate pain or severe pain.    Dispense:  100 tablet    Refill:  0     Follow-up: Return in about 2 months (around 06/05/2015) for a follow-up visit.  [name], MD

## 2015-04-05 NOTE — Progress Notes (Signed)
Pre visit review using our clinic review tool, if applicable. No additional management support is needed unless otherwise documented below in the visit note. 

## 2015-04-05 NOTE — Assessment & Plan Note (Signed)
Metformin, Glipizide, Invikana Insulin advised - he will think about it

## 2015-04-05 NOTE — Assessment & Plan Note (Signed)
On amlodipine, cardizem, Eliquis 

## 2015-04-05 NOTE — Assessment & Plan Note (Signed)
On amlodipine, cardizem 

## 2015-04-15 DIAGNOSIS — Z79899 Other long term (current) drug therapy: Secondary | ICD-10-CM | POA: Diagnosis not present

## 2015-04-15 DIAGNOSIS — L409 Psoriasis, unspecified: Secondary | ICD-10-CM | POA: Diagnosis not present

## 2015-04-16 ENCOUNTER — Ambulatory Visit: Payer: Medicare Other | Admitting: Internal Medicine

## 2015-05-08 DIAGNOSIS — H34811 Central retinal vein occlusion, right eye, with macular edema: Secondary | ICD-10-CM | POA: Diagnosis not present

## 2015-05-08 DIAGNOSIS — E119 Type 2 diabetes mellitus without complications: Secondary | ICD-10-CM | POA: Diagnosis not present

## 2015-05-08 DIAGNOSIS — H43811 Vitreous degeneration, right eye: Secondary | ICD-10-CM | POA: Diagnosis not present

## 2015-05-30 DIAGNOSIS — Z029 Encounter for administrative examinations, unspecified: Secondary | ICD-10-CM | POA: Diagnosis not present

## 2015-06-04 ENCOUNTER — Other Ambulatory Visit: Payer: Self-pay | Admitting: Nurse Practitioner

## 2015-06-05 ENCOUNTER — Ambulatory Visit: Payer: Medicare Other | Admitting: Internal Medicine

## 2015-06-25 ENCOUNTER — Encounter: Payer: Self-pay | Admitting: Cardiovascular Disease

## 2015-06-25 ENCOUNTER — Ambulatory Visit (INDEPENDENT_AMBULATORY_CARE_PROVIDER_SITE_OTHER): Payer: Medicare Other | Admitting: Cardiovascular Disease

## 2015-06-25 VITALS — BP 112/64 | HR 59 | Ht 71.0 in | Wt 260.0 lb

## 2015-06-25 DIAGNOSIS — I6523 Occlusion and stenosis of bilateral carotid arteries: Secondary | ICD-10-CM | POA: Diagnosis not present

## 2015-06-25 DIAGNOSIS — I481 Persistent atrial fibrillation: Secondary | ICD-10-CM | POA: Diagnosis not present

## 2015-06-25 DIAGNOSIS — I739 Peripheral vascular disease, unspecified: Secondary | ICD-10-CM

## 2015-06-25 DIAGNOSIS — I4819 Other persistent atrial fibrillation: Secondary | ICD-10-CM

## 2015-06-25 MED ORDER — AMIODARONE HCL 200 MG PO TABS: 200.0000 mg | ORAL_TABLET | Freq: Every day | ORAL | Status: DC

## 2015-06-25 NOTE — Patient Instructions (Signed)

## 2015-06-25 NOTE — Progress Notes (Signed)
Cardiology Office Note   Date:  06/25/2015   ID:  Michael, [name] [birth year], MRN [id number]  PCP:  [name], MD  Cardiologist: Darlin Coco MD   History of Present Illness: Michael [name] is a 74 y.o. male who was is here today for a follow-up visit regarding right subclavian artery stenosis.   He has known hx of PAF, HTN, HL, diabetes, prior GI bleed (2/2 small bowel AVM). He was admitted in March 2016 with atrial fibrillation with RVR. He converted to sinus rhythm with rate control therapy. He was placed on Eliquis for anticoagulation. He had elevated troponins. This was felt to be related to demand ischemia. Echocardiogram demonstrated normal LV function. Inpatient stress testing demonstrated no ischemia.  He had cardiac catheterization in April of this year which demonstrated mild to moderate diagonal, circumflex and OM disease, normal LV function and borderline significant innominate artery stenosis.  The catheterization was done via the right radial artery in the patient was noted to have calcified stenosis involving the right innominate artery. The patient underwent carotid US and upper extremity arterial duplex. He has bilateral ICA stenosis 40-59%. There was evidence of more proximal obstruction in the right upper extremity based upon waveform dampening.  The patient went on to have a CT angiogram which showed  Greater than 50% stenosis of the right subclavian artery.   The patient has known history of psoriasis arthritis affecting his hands, wrists and shoulders. He has significant contraction in his right wrist. During my initial evaluation, I felt that most of his symptoms are related to that and not to subclavian artery stenosis. I was able to obtain his records from Dr. Charlestine Night his rheumatologist. The patient was started on Humira with significant improvement in his right arm symptoms. He currently denies claudication and his arm does not get cold anymore.  Past  Medical History  Diagnosis Date  . Hypertension   . Hyperlipidemia   . Gout   . Arthritis   . Hypothyroidism   . Personal history of colonic polyps 2007, 2008    adenoma each time, largest 12 mm in 2007  . Iron deficiency anemia   . AVM (arteriovenous malformation) of colon   . Allergy   . H/O transfusion of whole blood   . PAF (paroxysmal atrial fibrillation) ([place])     a. 09/2014: Converted on Dilt;  b. CHA2DS2VASc = 3-->eliquis;  c. 09/2014 Echo: EF 55-60%, mild LVH, mildly dil LA.  [name] Elevated troponin     a. 09/2014 in setting of AF RVR-->Myoview: EF 49%, no ischemia/infarct->Med Rx.  [name] Dysrhythmia     ATRIAL FIBRILATION  . Type II diabetes mellitus ([place])     TYPE 2  . GERD (gastroesophageal reflux disease)   . Psoriasis   . CAD (coronary artery disease)     a. Cath 10/2014 - Mild to moderate diagonal, circumflex and obtuse marginal disease. Innominate artery sternosis.  . Carotid artery disease ([place])     a. Carotid dopple 03/2014 70-62% RICA &  37-62% LICA stenosis b. 8/31  . History of PFTs     PFTs 6/16:  FVC 3.73 (87%), FEV1 2.93 (88%), FEV1/FVC 78%, DLCO 69%    Past Surgical History  Procedure Laterality Date  . Rotator cuff repair      left  . Knee arthroscopy      right  . Colonoscopy w/ polypectomy  04/09/2006    12 mm adenoma  . Colonoscopy w/ polypectomy  06/17/2007  5 mm adenoma  . Colonoscopy  02/10/2011    internal hemorrhoids  . Esophagogastroduodenoscopy  12/23/2011    Procedure: ESOPHAGOGASTRODUODENOSCOPY (EGD);  Surgeon: Irene Shipper, MD;  Location: [facility];  Service: Endoscopy;  Laterality: N/A;  with small bowel bx's  . Givens capsule study  12/28/2011  . Laparoscopic appendectomy N/A 02/09/2014    Procedure: APPENDECTOMY LAPAROSCOPIC;  Surgeon: Leighton Ruff, MD;  Location: [facility];  Service: General;  Laterality: N/A;  . Appendectomy  02/09/2014  . Left heart catheterization with coronary angiogram N/A 11/15/2014    Procedure: LEFT HEART  CATHETERIZATION WITH CORONARY ANGIOGRAM;  Surgeon: Belva Crome, MD;  Location: [facility] CATH LAB;  Service: Cardiovascular;  Laterality: N/A;     Current Outpatient Prescriptions  Medication Sig Dispense Refill  . acetaminophen (TYLENOL) 650 MG CR tablet Take 1,300 mg by mouth every 8 (eight) hours as needed for pain.     [name] allopurinol (ZYLOPRIM) 100 MG tablet TAKE 2 TABLETS BY MOUTH DAILY 60 tablet 11  . amiodarone (PACERONE) 200 MG tablet Take 1 tablet (200 mg total) by mouth daily. 30 tablet 6  . apixaban (ELIQUIS) 5 MG TABS tablet Take 1 tablet (5 mg total) by mouth 2 (two) times daily. 60 tablet 10  . atorvastatin (LIPITOR) 40 MG tablet Take 1 tablet (40 mg total) by mouth daily. 30 tablet 6  . Blood Glucose Monitoring Suppl (ONE TOUCH ULTRA 2) W/DEVICE KIT Use as directed Dx E11.9 1 each 0  . CARTIA XT 120 MG 24 hr capsule TAke 1 capsule by mouth once daily  11  . Cholecalciferol 1000 UNITS TBDP Take 1,000 Units by mouth daily.    . clobetasol (TEMOVATE) 0.05 % external solution Apply 1 application topically 2 (two) times daily. Apply to scalp    . diltiazem (CARDIZEM CD) 180 MG 24 hr capsule Take 1 capsule (180 mg total) by mouth daily. 30 capsule 11  . ergocalciferol (VITAMIN D2) 50000 UNITS capsule Take 1 capsule (50,000 Units total) by mouth once a week. 6 capsule 0  . fenofibrate 160 MG tablet Take 1 tablet (160 mg total) by mouth daily. 30 tablet 6  . ferrous sulfate 325 (65 FE) MG tablet Take 325 mg by mouth 3 (three) times daily with meals.    . furosemide (LASIX) 20 MG tablet TAKE 1 TABLET(20 MG) BY MOUTH DAILY 90 tablet 0  . glipiZIDE (GLUCOTROL) 10 MG tablet TAKE ONE TABLET BY MOUTH  ONCE DAILY IN THE MORNING.  10  . glucose blood (ONE TOUCH ULTRA TEST) test strip Use as instructed qid prn 100 each 11  . HUMIRA PEN 40 MG/0.8ML PNKT Inject 40 mg into the skin every 14 (fourteen) days.    [name] HYDROcodone-acetaminophen (NORCO/VICODIN) 5-325 MG per tablet Take 1 tablet by mouth 2 (two)  times daily as needed for moderate pain or severe pain. 100 tablet 0  . Lancets (ONETOUCH ULTRASOFT) lancets Use to check blood sugars daily 30 each 11  . levothyroxine (SYNTHROID, LEVOTHROID) 150 MCG tablet Take 1 tablet (150 mcg total) by mouth daily before breakfast. 90 tablet 3  . lisinopril (PRINIVIL,ZESTRIL) 20 MG tablet Take 1 tablet (20 mg total) by mouth every morning. 90 tablet 3  . metoprolol (LOPRESSOR) 50 MG tablet TAKE 1 TABLET(50 MG) BY MOUTH TWICE DAILY 60 tablet 8  . omeprazole (PRILOSEC) 40 MG capsule Take 1 capsule (40 mg total) by mouth daily. 90 capsule 3  . predniSONE (DELTASONE) 5 MG tablet Take 5 mg by  mouth daily as needed (hand swelling).     . triamcinolone cream (KENALOG) 0.1 % Apply 1 application topically 2 (two) times daily. 80 g 3  . valACYclovir (VALTREX) 1000 MG tablet Take 1 g by mouth daily.  0   No current facility-administered medications for this visit.    Allergies:   Percocet    Social History:  The patient  reports that he quit smoking about 9 years ago. He has never used smokeless tobacco. He reports that he drinks about 0.6 oz of alcohol per week. He reports that he does not use illicit drugs.   Family History:  The patient's family history includes Cancer in his brother; Diabetes in his father; Heart attack in his father; Heart disease in his brother; Hypertension in his father and mother; Lung cancer in his father; Stroke in his father and mother. There is no history of Malignant hyperthermia.    ROS:  Please see the history of present illness.   Otherwise, review of systems are positive for none.   All other systems are reviewed and negative.    PHYSICAL EXAM: VS:  BP 112/64 mmHg  Pulse 59  Ht _0  (1.803 m)  Wt 260 lb (117.935 kg)  BMI 36.28 kg/m2  SpO2 94% , BMI Body mass index is 36.28 kg/(m^2). GEN: Well nourished, well developed, in no acute distress HEENT: normal Neck: Bilateral carotid bruits.  There is a bruit over the right  subclavian artery. Cardiac: RRR; there is a 1/6 systolic ejection murmur in the aortic area, rubs, or gallops,no edema.   the right radial pulse is significantly weaker than the left radial pulse Respiratory:  clear to auscultation bilaterally, normal work of breathing GI: soft, nontender, nondistended, + BS MS: no deformity or atrophy Skin: warm and dry, no rash Neuro:  Strength and sensation are intact Psych: euthymic mood, full affect   vascular: radial pulses +1 on the right side and +2 on the left side. Femoral pulse is normal bilaterally and distal pulses are palpable.   EKG:  Sinus rhythm with nonspecific ST and T wave changes.    Recent Labs: 10/11/2014: Magnesium 2.0 11/13/2014: B Natriuretic Peptide 502.4* 01/20/2015: Hemoglobin 12.1*; Platelets 171 03/08/2015: BUN 20; Creatinine, Ser 1.26; Potassium 4.3; Sodium 137; TSH 2.37 03/20/2015: ALT 15    Lipid Panel    Component Value Date/Time   CHOL 100 03/20/2015 1003   TRIG * 03/20/2015 1003    795.0 Triglyceride is over 400; calculations on Lipids are invalid.   HDL 22.30* 03/20/2015 1003   CHOLHDL 4 03/20/2015 1003   VLDL UNABLE TO CALCULATE IF TRIGLYCERIDE OVER 400 mg/dL 10/12/2014 0248   LDLCALC UNABLE TO CALCULATE IF TRIGLYCERIDE OVER 400 mg/dL 10/12/2014 0248   LDLDIRECT 26.0 03/20/2015 1003      Wt Readings from Last 3 Encounters:  06/25/15 260 lb (117.935 kg)  04/05/15 250 lb (113.399 kg)  03/08/15 253 lb (114.76 kg)      Other studies Reviewed: Additional studies/ records that were reviewed today include: Recent consultation with Elberta Leather[facility]. Review of the above records demonstrates: Gemfibrozil was changed to fibrillate and Lipitor was added.  So far the patient is not having any myalgias from his lipid therapy   ASSESSMENT AND PLAN:   Brachiocephalic artery stenosis, right Most of his symptoms seem to be related to psoriasis arthritis and not claudication. He responded very well to treatment with  Humira and currently there is no indication for revascularization. Continue to monitor symptoms.  PAF (paroxysmal atrial fibrillation)  Maintaining NSR. He is tolerating Eliquis. He denies any bleeding problems. He remains on amiodarone.   Chronic diastolic CHF (congestive heart failure) Volume appears stable on low-dose Lasix.  Coronary artery disease  No angina.    Carotid stenosis, bilateral  Carotid Doppler in April 2016 showed bilateral ICA 40-59%. Repeat carotid US in April 2017.    Signed, Kathlyn Sacramento, MD   06/25/2015 10:31 AM    [facility] [place], [place], [place]  [address] Phone: [phone]; Fax: [phone]

## 2015-07-05 DIAGNOSIS — H34811 Central retinal vein occlusion, right eye, with macular edema: Secondary | ICD-10-CM | POA: Diagnosis not present

## 2015-07-05 DIAGNOSIS — H35363 Drusen (degenerative) of macula, bilateral: Secondary | ICD-10-CM | POA: Diagnosis not present

## 2015-07-05 DIAGNOSIS — E119 Type 2 diabetes mellitus without complications: Secondary | ICD-10-CM | POA: Diagnosis not present

## 2015-07-05 DIAGNOSIS — H3582 Retinal ischemia: Secondary | ICD-10-CM | POA: Diagnosis not present

## 2015-07-05 DIAGNOSIS — H43811 Vitreous degeneration, right eye: Secondary | ICD-10-CM | POA: Diagnosis not present

## 2015-07-23 ENCOUNTER — Other Ambulatory Visit: Payer: Self-pay

## 2015-07-23 MED ORDER — ATORVASTATIN CALCIUM 40 MG PO TABS: 40.0000 mg | ORAL_TABLET | Freq: Every day | ORAL | Status: DC

## 2015-08-09 ENCOUNTER — Other Ambulatory Visit: Payer: Self-pay | Admitting: Cardiology

## 2015-08-26 ENCOUNTER — Other Ambulatory Visit: Payer: Self-pay | Admitting: Internal Medicine

## 2015-09-04 DIAGNOSIS — L409 Psoriasis, unspecified: Secondary | ICD-10-CM | POA: Diagnosis not present

## 2015-09-13 DIAGNOSIS — H35363 Drusen (degenerative) of macula, bilateral: Secondary | ICD-10-CM | POA: Diagnosis not present

## 2015-09-13 DIAGNOSIS — H3582 Retinal ischemia: Secondary | ICD-10-CM | POA: Diagnosis not present

## 2015-09-13 DIAGNOSIS — H43811 Vitreous degeneration, right eye: Secondary | ICD-10-CM | POA: Diagnosis not present

## 2015-09-13 DIAGNOSIS — E119 Type 2 diabetes mellitus without complications: Secondary | ICD-10-CM | POA: Diagnosis not present

## 2015-09-13 DIAGNOSIS — H34811 Central retinal vein occlusion, right eye, with macular edema: Secondary | ICD-10-CM | POA: Diagnosis not present

## 2015-09-17 ENCOUNTER — Other Ambulatory Visit (INDEPENDENT_AMBULATORY_CARE_PROVIDER_SITE_OTHER): Payer: Medicare Other

## 2015-09-17 ENCOUNTER — Encounter: Payer: Self-pay | Admitting: Internal Medicine

## 2015-09-17 ENCOUNTER — Ambulatory Visit (INDEPENDENT_AMBULATORY_CARE_PROVIDER_SITE_OTHER): Payer: Medicare Other | Admitting: Internal Medicine

## 2015-09-17 VITALS — BP 140/60 | HR 61 | Wt 265.0 lb

## 2015-09-17 DIAGNOSIS — K5520 Angiodysplasia of colon without hemorrhage: Secondary | ICD-10-CM | POA: Diagnosis not present

## 2015-09-17 DIAGNOSIS — L4050 Arthropathic psoriasis, unspecified: Secondary | ICD-10-CM

## 2015-09-17 DIAGNOSIS — N183 Chronic kidney disease, stage 3 (moderate): Secondary | ICD-10-CM | POA: Diagnosis not present

## 2015-09-17 DIAGNOSIS — I10 Essential (primary) hypertension: Secondary | ICD-10-CM

## 2015-09-17 DIAGNOSIS — E0922 Drug or chemical induced diabetes mellitus with diabetic chronic kidney disease: Secondary | ICD-10-CM

## 2015-09-17 LAB — BASIC METABOLIC PANEL
BUN: 29 mg/dL — AB (ref 6–23)
CHLORIDE: 101 meq/L (ref 96–112)
CO2: 26 mEq/L (ref 19–32)
Calcium: 9.4 mg/dL (ref 8.4–10.5)
Creatinine, Ser: 1.63 mg/dL — ABNORMAL HIGH (ref 0.40–1.50)
GFR: 44.3 mL/min — AB (ref >60.00)
Glucose, Bld: 281 mg/dL — ABNORMAL HIGH (ref 70–99)
POTASSIUM: 4.7 meq/L (ref 3.5–5.1)
SODIUM: 133 meq/L — AB (ref 135–145)

## 2015-09-17 LAB — TSH: TSH: 1.89 u[IU]/mL (ref 0.35–4.50)

## 2015-09-17 LAB — HEMOGLOBIN A1C: HEMOGLOBIN A1C: 8.3 % — AB (ref 4.6–6.5)

## 2015-09-17 NOTE — Assessment & Plan Note (Signed)
CBC

## 2015-09-17 NOTE — Progress Notes (Signed)
 Subjective:  Patient ID: Michael [name], male    DOB: 05/24/1941  Age: 75 y.o. MRN: [id number]  CC: No chief complaint on file.   HPI Lashun W Yanko presents for psoriatic arthritis - better on Humira; gout - doing ok, HTN, hypothyroidism  Outpatient Prescriptions Prior to Visit  Medication Sig Dispense Refill  . acetaminophen (TYLENOL) 650 MG CR tablet Take 1,300 mg by mouth every 8 (eight) hours as needed for pain.     . allopurinol (ZYLOPRIM) 100 MG tablet TAKE 2 TABLETS BY MOUTH DAILY 60 tablet 11  . amiodarone (PACERONE) 200 MG tablet Take 1 tablet (200 mg total) by mouth daily. 30 tablet 11  . apixaban (ELIQUIS) 5 MG TABS tablet Take 1 tablet (5 mg total) by mouth 2 (two) times daily. 60 tablet 10  . atorvastatin (LIPITOR) 40 MG tablet Take 1 tablet (40 mg total) by mouth daily. 30 tablet 6  . Blood Glucose Monitoring Suppl (ONE TOUCH ULTRA 2) W/DEVICE KIT Use as directed Dx E11.9 1 each 0  . Cholecalciferol 1000 UNITS TBDP Take 1,000 Units by mouth daily.    . clobetasol (TEMOVATE) 0.05 % external solution Apply 1 application topically 2 (two) times daily. Apply to scalp    . diltiazem (CARDIZEM CD) 180 MG 24 hr capsule Take 1 capsule (180 mg total) by mouth daily. 30 capsule 11  . ergocalciferol (VITAMIN D2) 50000 UNITS capsule Take 1 capsule (50,000 Units total) by mouth once a week. 6 capsule 0  . fenofibrate 160 MG tablet TAKE 1 TABLET(160 MG) BY MOUTH DAILY 30 tablet 11  . fenofibrate 160 MG tablet TAKE 1 TABLET(160 MG) BY MOUTH DAILY 30 tablet 11  . ferrous sulfate 325 (65 FE) MG tablet Take 325 mg by mouth 3 (three) times daily with meals.    . furosemide (LASIX) 20 MG tablet TAKE 1 TABLET(20 MG) BY MOUTH DAILY 90 tablet 0  . glipiZIDE (GLUCOTROL) 10 MG tablet TAKE ONE TABLET BY MOUTH  ONCE DAILY IN THE MORNING.  10  . HUMIRA PEN 40 MG/0.8ML PNKT Inject 40 mg into the skin every 14 (fourteen) days.    . HYDROcodone-acetaminophen (NORCO/VICODIN) 5-325 MG per tablet Take 1  tablet by mouth 2 (two) times daily as needed for moderate pain or severe pain. 100 tablet 0  . Lancets (ONETOUCH ULTRASOFT) lancets Use to check blood sugars daily 30 each 11  . levothyroxine (SYNTHROID, LEVOTHROID) 150 MCG tablet Take 1 tablet (150 mcg total) by mouth daily before breakfast. 90 tablet 3  . lisinopril (PRINIVIL,ZESTRIL) 20 MG tablet Take 1 tablet (20 mg total) by mouth every morning. 90 tablet 3  . metoprolol (LOPRESSOR) 50 MG tablet TAKE 1 TABLET(50 MG) BY MOUTH TWICE DAILY 60 tablet 8  . omeprazole (PRILOSEC) 40 MG capsule Take 1 capsule (40 mg total) by mouth daily. 90 capsule 3  . ONE TOUCH ULTRA TEST test strip CHECK BLOOD GLUCOSE (SUGAR) DAILY 100 each 0  . triamcinolone cream (KENALOG) 0.1 % Apply 1 application topically 2 (two) times daily. 80 g 3  . valACYclovir (VALTREX) 1000 MG tablet Take 1 g by mouth daily.  0  . predniSONE (DELTASONE) 5 MG tablet Take 5 mg by mouth daily as needed (hand swelling). Reported on 09/17/2015     No facility-administered medications prior to visit.    ROS Review of Systems  Constitutional: Negative for appetite change, fatigue and unexpected weight change.  HENT: Negative for congestion, nosebleeds, sneezing, sore throat and trouble swallowing.     Eyes: Negative for itching and visual disturbance.  Respiratory: Negative for cough.   Cardiovascular: Negative for chest pain, palpitations and leg swelling.  Gastrointestinal: Negative for nausea, diarrhea, blood in stool and abdominal distention.  Genitourinary: Negative for frequency and hematuria.  Musculoskeletal: Positive for arthralgias and gait problem. Negative for back pain, joint swelling and neck pain.  Skin: Negative for rash.  Neurological: Negative for dizziness, tremors, speech difficulty and weakness.  Psychiatric/Behavioral: Negative for suicidal ideas, sleep disturbance, dysphoric mood and agitation. The patient is not nervous/anxious.     Objective:  BP 140/60 mmHg   Pulse 61  Wt 265 lb (120.203 kg)  SpO2 96%  BP Readings from Last 3 Encounters:  09/17/15 140/60  06/25/15 112/64  04/05/15 139/72    Wt Readings from Last 3 Encounters:  09/17/15 265 lb (120.203 kg)  06/25/15 260 lb (117.935 kg)  04/05/15 250 lb (113.399 kg)    Physical Exam  Constitutional: He is oriented to person, place, and time. He appears well-developed. No distress.  NAD  HENT:  Mouth/Throat: Oropharynx is clear and moist.  Eyes: Conjunctivae are normal. Pupils are equal, round, and reactive to light.  Neck: Normal range of motion. No JVD present. No thyromegaly present.  Cardiovascular: Normal rate, regular rhythm, normal heart sounds and intact distal pulses.  Exam reveals no gallop and no friction rub.   No murmur heard. Pulmonary/Chest: Effort normal and breath sounds normal. No respiratory distress. He has no wheezes. He has no rales. He exhibits no tenderness.  Abdominal: Soft. Bowel sounds are normal. He exhibits no distension and no mass. There is no tenderness. There is no rebound and no guarding.  Musculoskeletal: Normal range of motion. He exhibits tenderness. He exhibits no edema.  Lymphadenopathy:    He has no cervical adenopathy.  Neurological: He is alert and oriented to person, place, and time. He has normal reflexes. No cranial nerve deficit. He exhibits normal muscle tone. He displays a negative Romberg sign. Coordination and gait normal.  Skin: Skin is warm and dry. No rash noted.  Psychiatric: He has a normal mood and affect. His behavior is normal. Judgment and thought content normal.  obese  Lab Results  Component Value Date   WBC 5.5 01/20/2015   HGB 12.1* 01/20/2015   HCT 34.7* 01/20/2015   PLT 171 01/20/2015   GLUCOSE 236* 03/08/2015   CHOL 100 03/20/2015   TRIG * 03/20/2015    795.0 Triglyceride is over 400; calculations on Lipids are invalid.   HDL 22.30* 03/20/2015   LDLDIRECT 26.0 03/20/2015   LDLCALC UNABLE TO CALCULATE IF  TRIGLYCERIDE OVER 400 mg/dL 10/12/2014   ALT 15 03/20/2015   AST 18 03/20/2015   NA 137 03/08/2015   K 4.3 03/08/2015   CL 103 03/08/2015   CREATININE 1.26 03/08/2015   BUN 20 03/08/2015   CO2 29 03/08/2015   TSH 2.37 03/08/2015   PSA 0.50 11/07/2013   INR 1.17 01/20/2015   HGBA1C 6.6* 03/08/2015   MICROALBUR 1.2 05/16/2013    Dg Thoracic Spine 2 View  03/08/2015  CLINICAL DATA:  Pain for 3 days.  Prior fall.  Initial evaluation. EXAM: THORACIC SPINE 2 VIEWS COMPARISON:  None. FINDINGS: Paraspinal soft tissues are normal. No acute bony abnormality identified. Normal alignment. Diffuse degenerative change thoracic spine. IMPRESSION: Diffuse degenerative change noted of the thoracic spine. No acute abnormality. Electronically Signed   By: Marcello Moores  Register   On: 03/08/2015 14:07   Dg Lumbar Spine 2-3 Views  03/08/2015  CLINICAL DATA:  Fall.  Back pain.  Initial evaluation. EXAM: LUMBAR SPINE - 2-3 VIEW COMPARISON:  CT 02/09/2014. FINDINGS: Gallstones. Diffuse multilevel degenerative change. No acute abnormality identified. No evidence of fracture. Normal alignment mineralization. Aortoiliac atherosclerotic vascular disease. IMPRESSION: 1. Diffuse degenerative change.  No acute abnormality. 2. Gallstones. 3. Aortoiliac atherosclerotic vascular disease. Electronically Signed   By: Thomas  Register   On: 03/08/2015 14:09    Assessment & Plan:   There are no diagnoses linked to this encounter. I am having Mr. Eissler maintain his acetaminophen, predniSONE, ferrous sulfate, ergocalciferol, ONE TOUCH ULTRA 2, onetouch ultrasoft, lisinopril, omeprazole, Cholecalciferol, clobetasol, apixaban, diltiazem, furosemide, triamcinolone cream, valACYclovir, allopurinol, glipiZIDE, levothyroxine, HYDROcodone-acetaminophen, metoprolol, HUMIRA PEN, amiodarone, atorvastatin, fenofibrate, fenofibrate, and ONE TOUCH ULTRA TEST.  No orders of the defined types were placed in this encounter.     Follow-up: No  Follow-up on file.  [name] , MD  

## 2015-09-17 NOTE — Assessment & Plan Note (Signed)
Humira 

## 2015-09-17 NOTE — Assessment & Plan Note (Signed)
Lisinopril 

## 2015-09-17 NOTE — Assessment & Plan Note (Signed)
Loose wt 

## 2015-09-17 NOTE — Progress Notes (Signed)
Pre visit review using our clinic review tool, if applicable. No additional management support is needed unless otherwise documented below in the visit note. 

## 2015-10-04 ENCOUNTER — Telehealth: Payer: Self-pay | Admitting: Internal Medicine

## 2015-10-04 ENCOUNTER — Other Ambulatory Visit: Payer: Self-pay

## 2015-10-04 MED ORDER — BLOOD PRESSURE MONITOR KIT: PACK | Status: AC

## 2015-10-04 MED ORDER — APIXABAN 5 MG PO TABS
5.0000 mg | ORAL_TABLET | Freq: Two times a day (BID) | ORAL | Status: DC
Start: 2015-10-04 — End: 2015-10-14

## 2015-10-04 NOTE — Telephone Encounter (Signed)
Called wife back no answer LMOM rx has been sent...Michael [name]

## 2015-10-04 NOTE — Telephone Encounter (Signed)
Is requesting script for blood pressure cuff.  Would like to get cuff from [facility] at [address] and [address].

## 2015-10-06 ENCOUNTER — Other Ambulatory Visit: Payer: Self-pay | Admitting: Internal Medicine

## 2015-10-14 ENCOUNTER — Other Ambulatory Visit: Payer: Self-pay | Admitting: Cardiology

## 2015-10-21 ENCOUNTER — Telehealth: Payer: Self-pay | Admitting: Cardiovascular Disease

## 2015-10-21 NOTE — Telephone Encounter (Signed)
New message ° ° ° ° ° °Talk to the nurse regarding eliquis °

## 2015-10-22 NOTE — Telephone Encounter (Signed)
Assistance denied by [employer] due to having Rx drug coverage. He will be getting a letter stating this as well.

## 2015-11-05 ENCOUNTER — Other Ambulatory Visit: Payer: Self-pay | Admitting: Internal Medicine

## 2015-11-06 ENCOUNTER — Telehealth: Payer: Self-pay | Admitting: *Deleted

## 2015-11-06 NOTE — Telephone Encounter (Signed)
sending a message to sharon yow to have script printed & signed by Dr. Fletcher Anon and faxed to [name] for eliquis 5 mg.

## 2015-11-07 ENCOUNTER — Other Ambulatory Visit: Payer: Self-pay

## 2015-11-07 MED ORDER — APIXABAN 5 MG PO TABS
5.0000 mg | ORAL_TABLET | Freq: Two times a day (BID) | ORAL | Status: DC
Start: 2015-11-07 — End: 2016-05-13

## 2015-11-07 NOTE — Telephone Encounter (Signed)
Prescription signed and faxed to [facility], [phone]

## 2015-11-15 ENCOUNTER — Telehealth: Payer: Self-pay | Admitting: *Deleted

## 2015-11-15 ENCOUNTER — Other Ambulatory Visit: Payer: Self-pay | Admitting: *Deleted

## 2015-11-15 ENCOUNTER — Other Ambulatory Visit: Payer: Self-pay | Admitting: Cardiovascular Disease

## 2015-11-15 ENCOUNTER — Other Ambulatory Visit: Payer: Self-pay | Admitting: Nurse Practitioner

## 2015-11-15 MED ORDER — METOPROLOL TARTRATE 50 MG PO TABS: ORAL_TABLET | ORAL | Status: DC

## 2015-11-15 NOTE — Telephone Encounter (Signed)
Per patients wife, the patient assistance application was approved for eliquis but they will not receive it in the mail until next week. She would like some samples for him as he will be out tomorrow. Samples placed at the front desk.

## 2015-11-15 NOTE — Telephone Encounter (Signed)
Review for refill, Thank you. 

## 2015-11-21 ENCOUNTER — Other Ambulatory Visit: Payer: Self-pay | Admitting: Internal Medicine

## 2015-11-26 ENCOUNTER — Telehealth: Payer: Self-pay | Admitting: Cardiovascular Disease

## 2015-11-26 NOTE — Telephone Encounter (Signed)
Received fax from [facility] regarding possible drug interaction between metoprolol, amiodarone and cartia XT. Per Dr. Fletcher Anon, pt needs f/u w/Dr. Mare Ferrari in [place] to discuss. Pt last seen by Dr. Mare Ferrari 01/16/15 w/instructions for 4 month f/u.  Pt sees Dr. Fletcher Anon for vascular issues.  Called pt to schedule OV. Left message on pt VM to call back.

## 2015-11-26 NOTE — Telephone Encounter (Signed)
Pt called back and scheduled 5/26, 11am appt w/Dr. Johnsie Cancel

## 2015-12-02 ENCOUNTER — Other Ambulatory Visit: Payer: Self-pay | Admitting: Internal Medicine

## 2015-12-02 DIAGNOSIS — I6523 Occlusion and stenosis of bilateral carotid arteries: Secondary | ICD-10-CM

## 2015-12-03 ENCOUNTER — Other Ambulatory Visit: Payer: Self-pay | Admitting: Cardiology

## 2015-12-06 DIAGNOSIS — H43811 Vitreous degeneration, right eye: Secondary | ICD-10-CM | POA: Diagnosis not present

## 2015-12-06 DIAGNOSIS — E119 Type 2 diabetes mellitus without complications: Secondary | ICD-10-CM | POA: Diagnosis not present

## 2015-12-06 DIAGNOSIS — H35363 Drusen (degenerative) of macula, bilateral: Secondary | ICD-10-CM | POA: Diagnosis not present

## 2015-12-06 DIAGNOSIS — H34811 Central retinal vein occlusion, right eye, with macular edema: Secondary | ICD-10-CM | POA: Diagnosis not present

## 2015-12-06 DIAGNOSIS — H3582 Retinal ischemia: Secondary | ICD-10-CM | POA: Diagnosis not present

## 2015-12-12 ENCOUNTER — Ambulatory Visit (HOSPITAL_COMMUNITY)
Admission: RE | Admit: 2015-12-12 | Discharge: 2015-12-12 | Disposition: A | Discharge status: Home / Self Care | Payer: Medicare Other | Source: Ambulatory Visit | Attending: Physician Assistant | Admitting: Physician Assistant

## 2015-12-12 DIAGNOSIS — I739 Peripheral vascular disease, unspecified: Secondary | ICD-10-CM | POA: Diagnosis not present

## 2015-12-12 DIAGNOSIS — I10 Essential (primary) hypertension: Secondary | ICD-10-CM | POA: Diagnosis not present

## 2015-12-12 DIAGNOSIS — E785 Hyperlipidemia, unspecified: Secondary | ICD-10-CM | POA: Insufficient documentation

## 2015-12-12 DIAGNOSIS — I6523 Occlusion and stenosis of bilateral carotid arteries: Secondary | ICD-10-CM | POA: Diagnosis not present

## 2015-12-12 DIAGNOSIS — Z87891 Personal history of nicotine dependence: Secondary | ICD-10-CM | POA: Diagnosis not present

## 2015-12-12 DIAGNOSIS — E119 Type 2 diabetes mellitus without complications: Secondary | ICD-10-CM | POA: Insufficient documentation

## 2015-12-16 ENCOUNTER — Other Ambulatory Visit: Payer: Self-pay

## 2015-12-16 ENCOUNTER — Other Ambulatory Visit (INDEPENDENT_AMBULATORY_CARE_PROVIDER_SITE_OTHER): Payer: Medicare Other

## 2015-12-16 ENCOUNTER — Ambulatory Visit (INDEPENDENT_AMBULATORY_CARE_PROVIDER_SITE_OTHER): Payer: Medicare Other | Admitting: Internal Medicine

## 2015-12-16 ENCOUNTER — Encounter: Payer: Self-pay | Admitting: Internal Medicine

## 2015-12-16 ENCOUNTER — Ambulatory Visit: Payer: Medicare Other

## 2015-12-16 VITALS — BP 174/70 | HR 63 | Temp 97.7°F | Ht 71.0 in | Wt 262.8 lb

## 2015-12-16 DIAGNOSIS — E119 Type 2 diabetes mellitus without complications: Secondary | ICD-10-CM

## 2015-12-16 DIAGNOSIS — I6523 Occlusion and stenosis of bilateral carotid arteries: Secondary | ICD-10-CM | POA: Diagnosis not present

## 2015-12-16 DIAGNOSIS — N183 Chronic kidney disease, stage 3 unspecified: Secondary | ICD-10-CM

## 2015-12-16 DIAGNOSIS — I480 Paroxysmal atrial fibrillation: Secondary | ICD-10-CM | POA: Diagnosis not present

## 2015-12-16 DIAGNOSIS — Z9111 Patient's noncompliance with dietary regimen: Secondary | ICD-10-CM

## 2015-12-16 DIAGNOSIS — I10 Essential (primary) hypertension: Secondary | ICD-10-CM

## 2015-12-16 DIAGNOSIS — M5440 Lumbago with sciatica, unspecified side: Secondary | ICD-10-CM

## 2015-12-16 DIAGNOSIS — Z9114 Patient's other noncompliance with medication regimen: Secondary | ICD-10-CM

## 2015-12-16 DIAGNOSIS — E0922 Drug or chemical induced diabetes mellitus with diabetic chronic kidney disease: Secondary | ICD-10-CM

## 2015-12-16 DIAGNOSIS — Z91199 Patient's noncompliance with other medical treatment and regimen due to unspecified reason: Secondary | ICD-10-CM

## 2015-12-16 DIAGNOSIS — Z0000 Encounter for general adult medical examination without abnormal findings: Secondary | ICD-10-CM | POA: Diagnosis not present

## 2015-12-16 LAB — BASIC METABOLIC PANEL
BUN: 16 mg/dL (ref 6–23)
CO2: 27 mEq/L (ref 19–32)
Calcium: 9 mg/dL (ref 8.4–10.5)
Chloride: 99 mEq/L (ref 96–112)
Creatinine, Ser: 1.39 mg/dL (ref 0.40–1.50)
GFR: 53.21 mL/min — AB (ref >60.00)
GLUCOSE: 398 mg/dL — AB (ref 70–99)
POTASSIUM: 4.5 meq/L (ref 3.5–5.1)
SODIUM: 132 meq/L — AB (ref 135–145)

## 2015-12-16 LAB — HEMOGLOBIN A1C: HEMOGLOBIN A1C: 8.4 % — AB (ref 4.6–6.5)

## 2015-12-16 MED ORDER — DILTIAZEM HCL ER COATED BEADS 180 MG PO CP24: 180.0000 mg | ORAL_CAPSULE | Freq: Every day | ORAL | Status: DC

## 2015-12-16 MED ORDER — FUROSEMIDE 20 MG PO TABS: 20.0000 mg | ORAL_TABLET | Freq: Every day | ORAL | Status: DC

## 2015-12-16 MED ORDER — HYDROCODONE-ACETAMINOPHEN 5-325 MG PO TABS: 1.0000 | ORAL_TABLET | Freq: Two times a day (BID) | ORAL | Status: DC | PRN

## 2015-12-16 NOTE — Patient Instructions (Signed)
  Mr. Nagle , Thank you for taking time to come for your Medicare Wellness Visit. I appreciate your ongoing commitment to your health goals. Please review the following plan we discussed and let me know if I can assist you in the future.     These are the goals we discussed: Goals    . patient     Keep active;  Carry sugar (jelly bean, raisins; BE PREPARED for a low blood sugar since you get little warning        May want to visit the [facility] center to help stabilize BS  Diabetes and weight loss; Address: [address], [place], [place] [address]  Phone: [phone]   Deaf & Hard of Hearing Division Services  No reviews  [facility]  [id number]  [phone]  ----   This is a list of the screening recommended for you and due dates:  Health Maintenance  Topic Date Due  . Complete foot exam   09/30/1952  . Eye exam for diabetics  09/30/1952  . Tetanus Vaccine  09/30/1961  . Pneumonia vaccines (2 of 2 - PPSV23) 07/27/2012  . Flu Shot  03/16/2016*  . Shingles Vaccine  03/16/2016*  . Hemoglobin A1C  03/16/2016  . Colon Cancer Screening  12/25/2019  *Topic was postponed. The date shown is not the original due date.    '

## 2015-12-16 NOTE — Assessment & Plan Note (Signed)
On amlodipine, cardizem, Eliquis 

## 2015-12-16 NOTE — Assessment & Plan Note (Signed)
Chronic on Metformin, Glipizide 2016 off Invokana Insulin discussed Labs

## 2015-12-16 NOTE — Assessment & Plan Note (Signed)
Chronic - he is supposed to be on Lisinopril, Metoprolol, Lasix, Diltazem He is out of some of his meds: asked to re-start Risks associated with treatment and diet (NAS) noncompliance were discussed. Compliance was encouraged.

## 2015-12-16 NOTE — Progress Notes (Signed)
Subjective:   Michael [name] is a 75 y.o. male who presents for Medicare Annual/Subsequent preventive examination.  Review of Systems:   HRA assessment completed during visit; [place], [place]/ seeing Dr. Jacalyn Lefevre at [place],  The Patient was informed that this wellness visit is to identify risk and educate on how to reduce risk for increase disease through lifestyle changes.   ROS deferred to CPE exam with physician completed recently Family and medical hx given below;  Father had MI, lung cancer, DM, stroke, HTN ( he did  smoke)  Mother had stroke, HTN Brother cancer and HD  Lifestyle review:  DM: A1c up to 8.3 08/2015; was running 6.6 / glipizide 32m;  HTN; elevated today but stated he was of Lasix Hyperlipidemia: treated for elevated Triglycerides; cho 100, HDl 22; LDL 26/ on fenofibrate Taking Humira q 2 weeks   Psychosocial Lives with spouse  Tobacco: no/ quit x 182yo; with a bout of pneumonia  ETOH: Occasional beer; 2 a month  Medication review/ Went off his lasix since RX ran out Also request pain med refill  BMI: 36.5  Diet;   BS in the am; varies; takes occasionally; wife checks;  States last low was one day last week; got light headed;  Had breakfast 6:30am and this was 9:30 am; hits him very quickly Has occurred for the last year 1 or 2 times a month  Breakfast; bowl of cereal; sausage biscuit; bacon and eggs;  Lunch; sandwich; cheese and crackers  Supper: Last night polish sausage; potatoes au gratin  He cooks for dinner;  Eats a lot of fruits   Exercise;  strenuous yard work; putting up pool, digging up a garden with a shovel  Low blood sugars not asso with exercise  Helping sister with log cabin; added room; pHunter   No falls; no falls this past year Removal of clutter clearing paths through the home,    CWarehouse manager YES Smoke detectors YES Firearms safety reviewed and will keep in a safe place if these exist. Driving accidents;  no;   Advised to use sun protection or large brim hat; no sunscreen; encouraged to wear a hat  Stressors (1-5)  stress around 2   Depression: Denies feeling depressed or hopeless; voices pleasure in daily life  Cognitive; Denies memory issues; activing building;  Manages checkbook, medications; no failures of task Ad8 score reviewed for issues;  Mobilization and Functional losses from last year to this year? Can make a fist; psoriasis cleared up and is happy with the result of Humira   Sleep pattern changes; good some nights and not others; has h/a occasionally  Enc to check his bs next time you wake up with h/a  Urinary or fecal incontinence reviewed: no  Counseling Health Maintenance Foot exam due  Colonoscopy; 11/2014; repeat 3 years; 11/2017/ (patient states 10 years)  EKG: 05/2015 Hearing:  Hearing has been checked; does not want a hearing adi 1000hz  left and right; given information regarding free hearing aid  Ophthalmology exam: eye exams every year;  Gets OD injection mac degeneration;   Immunizations Due: TD; PSV 23/ defer to Dr. PAlain Marionsecondary to Humira States his other doctor told him not to get  vaccinations; Stated these vaccines are not live but can discuss with Dr. PAlain Marion  Advanced Directive; completed; bring to doctor's office   Health Recommendations and Referrals To [facility] and [facility] Given information; He has not been but his wife has.  Barriers to Success States wife feels Diabetes can't be controlled;  Educated that diet; whether sugar of fat based always turns to sugar and is stored if not used. Encouraged him to get hypoglycemic episodes under control and to carry sugar or candy    Current Care Team reviewed and updated  Cardiac Risk Factors include: advanced age (>16mn, >>71women);dyslipidemia;hypertension;family history of premature cardiovascular disease;obesity (BMI >30kg/m2);sedentary lifestyle     Objective:      Vitals: BP 174/70 mmHg  Pulse 63  Temp(Src) 97.7 F (36.5 C)  Ht 5' 11"  (1.803 m)  Wt 262 lb 12 oz (119.183 kg)  BMI 36.66 kg/m2  SpO2 98%  Body mass index is 36.66 kg/(m^2).  Tobacco History  Smoking status  . Former Smoker -- 0.70 packs/day for 48 years  . Types: Cigarettes  . Quit date: 03/29/2006  Smokeless tobacco  . Never Used     Counseling given: Yes   Past Medical History  Diagnosis Date  . Hypertension   . Hyperlipidemia   . Gout   . Arthritis   . Hypothyroidism   . Personal history of colonic polyps 2007, 2008    adenoma each time, largest 12 mm in 2007  . Iron deficiency anemia   . AVM (arteriovenous malformation) of colon   . Allergy   . H/O transfusion of whole blood   . PAF (paroxysmal atrial fibrillation) ([place]     a. 09/2014: Converted on Dilt;  b. CHA2DS2VASc = 3-->eliquis;  c. 09/2014 Echo: EF 55-60%, mild LVH, mildly dil LA.  .Marland KitchenElevated troponin     a. 09/2014 in setting of AF RVR-->Myoview: EF 49%, no ischemia/infarct->Med Rx.  .Marland KitchenDysrhythmia     ATRIAL FIBRILATION  . Type II diabetes mellitus ([facility]     TYPE 2  . GERD (gastroesophageal reflux disease)   . Psoriasis   . CAD (coronary artery disease)     a. Cath 10/2014 - Mild to moderate diagonal, circumflex and obtuse marginal disease. Innominate artery sternosis.  . Carotid artery disease ([facility]     a. Carotid dopple 03/2014 434-28%RICA &  676-81%LICA stenosis b. 41/57 . History of PFTs     PFTs 6/16:  FVC 3.73 (87%), FEV1 2.93 (88%), FEV1/FVC 78%, DLCO 69%   Past Surgical History  Procedure Laterality Date  . Rotator cuff repair      left  . Knee arthroscopy      right  . Colonoscopy w/ polypectomy  04/09/2006    12 mm adenoma  . Colonoscopy w/ polypectomy  06/17/2007    5 mm adenoma  . Colonoscopy  02/10/2011    internal hemorrhoids  . Esophagogastroduodenoscopy  12/23/2011    Procedure: ESOPHAGOGASTRODUODENOSCOPY (EGD);  Surgeon: JIrene Shipper MD;  [facility];  Service:  Endoscopy;  Laterality: N/A;  with small bowel bx's  . Givens capsule study  12/28/2011  . Laparoscopic appendectomy N/A 02/09/2014    Procedure: APPENDECTOMY LAPAROSCOPIC;  Surgeon: ALeighton Ruff MD;  Location: [facility];  Service: General;  Laterality: N/A;  . Appendectomy  02/09/2014  . Left heart catheterization with coronary angiogram N/A 11/15/2014    Procedure: LEFT HEART CATHETERIZATION WITH CORONARY ANGIOGRAM;  Surgeon: HBelva Crome MD;  [facility];  Service: Cardiovascular;  Laterality: N/A;   Family History  Problem Relation Age of Onset  . Malignant hyperthermia Neg Hx   . Heart attack Father   . Lung cancer Father   . Diabetes Father   .  Stroke Father   . Hypertension Father   . Stroke Mother   . Hypertension Mother   . Cancer Brother     liver  . Heart disease Brother     chf   History  Sexual Activity  . Sexual Activity: Not Currently    Outpatient Encounter Prescriptions as of 12/16/2015  Medication Sig  . acetaminophen (TYLENOL) 650 MG CR tablet Take 1,300 mg by mouth every 8 (eight) hours as needed for pain.   [name] allopurinol (ZYLOPRIM) 100 MG tablet TAKE 2 TABLETS BY MOUTH DAILY  . amiodarone (PACERONE) 200 MG tablet Take 1 tablet (200 mg total) by mouth daily.  [name] apixaban (ELIQUIS) 5 MG TABS tablet Take 1 tablet (5 mg total) by mouth 2 (two) times daily.  [name] atorvastatin (LIPITOR) 40 MG tablet Take 1 tablet (40 mg total) by mouth daily.  . Blood Glucose Monitoring Suppl (ONE TOUCH ULTRA 2) W/DEVICE KIT Use as directed Dx E11.9  . Blood Pressure Monitor KIT Use to check blood pressure daily Dx I10  . Cholecalciferol 1000 UNITS TBDP Take 1,000 Units by mouth daily.  . ergocalciferol (VITAMIN D2) 50000 UNITS capsule Take 1 capsule (50,000 Units total) by mouth once a week.  . fenofibrate 160 MG tablet TAKE 1 TABLET(160 MG) BY MOUTH DAILY  . fenofibrate 160 MG tablet TAKE 1 TABLET(160 MG) BY MOUTH DAILY  . ferrous sulfate 325 (65 FE) MG tablet Take 325 mg  by mouth 3 (three) times daily with meals.  [name] glipiZIDE (GLUCOTROL) 10 MG tablet TAKE ONE TABLET BY MOUTH ONCE DAILY IN THE MORNING.  [name] HUMIRA PEN 40 MG/0.8ML PNKT Inject 40 mg into the skin every 14 (fourteen) days.  . Lancets (ONETOUCH ULTRASOFT) lancets Use to check blood sugars daily  . levothyroxine (SYNTHROID, LEVOTHROID) 150 MCG tablet Take 1 tablet (150 mcg total) by mouth daily before breakfast.  . lisinopril (PRINIVIL,ZESTRIL) 20 MG tablet TAKE 1 TABLET BY MOUTH EVERY MORNING  . metoprolol (LOPRESSOR) 50 MG tablet TAKE 1 TABLET(50 MG) BY MOUTH TWICE DAILY  . omeprazole (PRILOSEC) 40 MG capsule TAKE 1 CAPSULE BY MOUTH EVERY DAY  . ONE TOUCH ULTRA TEST test strip CHECK BLOOD GLUCOSE (SUGAR) DAILY  . valACYclovir (VALTREX) 1000 MG tablet Take 1 g by mouth daily.  . [DISCONTINUED] diltiazem (CARDIZEM CD) 180 MG 24 hr capsule Take 1 capsule (180 mg total) by mouth daily.  . clobetasol (TEMOVATE) 0.05 % external solution Apply 1 application topically 2 (two) times daily. Reported on 12/16/2015  . furosemide (LASIX) 20 MG tablet Take 1 tablet (20 mg total) by mouth daily.  [name] HYDROcodone-acetaminophen (NORCO/VICODIN) 5-325 MG tablet Take 1 tablet by mouth 2 (two) times daily as needed for moderate pain or severe pain.  . predniSONE (DELTASONE) 5 MG tablet Take 5 mg by mouth daily as needed (hand swelling). Reported on 12/16/2015  . triamcinolone cream (KENALOG) 0.1 % Apply 1 application topically 2 (two) times daily. (Patient not taking: Reported on 12/16/2015)  . [DISCONTINUED] furosemide (LASIX) 20 MG tablet TAKE 1 TABLET(20 MG) BY MOUTH DAILY (Patient not taking: Reported on 12/16/2015)  . [DISCONTINUED] HYDROcodone-acetaminophen (NORCO/VICODIN) 5-325 MG per tablet Take 1 tablet by mouth 2 (two) times daily as needed for moderate pain or severe pain. (Patient not taking: Reported on 12/16/2015)   No facility-administered encounter medications on file as of 12/16/2015.    Activities of Daily  Living In your present state of health, do you have any difficulty performing the following activities: 12/16/2015  Hearing? Y  Vision? Y  Difficulty concentrating or making decisions? N  Walking or climbing stairs? N  Dressing or bathing? N  Doing errands, shopping? N  Preparing Food and eating ? N  Using the Toilet? N  In the past six months, have you accidently leaked urine? N  Do you have problems with loss of bowel control? N  Managing your Medications? N  Managing your Finances? N  Housekeeping or managing your Housekeeping? N    Patient Care Team: Cassandria Anger, MD as PCP - General (Internal Medicine) Gatha Mayer, MD as Consulting Physician (Gastroenterology) Hurley Cisco, MD as Consulting Physician (Rheumatology) Wyatt Portela, MD as Consulting Physician (Oncology) Remer Macho, MD as Attending Physician (Ophthalmology)   Assessment:     Exercise Activities and Dietary recommendations Current Exercise Habits: Home exercise routine;The patient has a physically strenous job, but has no regular exercise apart from work., Intensity: Moderate, Exercise limited by: Other - see comments  Goals    . patient     Keep active;  Carry sugar (jelly bean, raisins; BE PREPARED for a low blood sugar since you get little warning      Fall Risk Fall Risk  12/16/2015 07/26/2014 12/08/2012  Falls in the past year? No No No   Depression Screen PHQ 2/9 Scores 12/16/2015 07/26/2014 12/08/2012  PHQ - 2 Score 0 0 0    Cognitive Testing No flowsheet data found.  Ad8 score 0  Immunization History  Administered Date(s) Administered  . Pneumococcal Conjugate-13 07/28/2011   Declined any vaccines;   Screening Tests Health Maintenance  Topic Date Due  . FOOT EXAM  09/30/1952  . OPHTHALMOLOGY EXAM  09/30/1952  . TETANUS/TDAP  09/30/1961  . PNA vac Low Risk Adult (2 of 2 - PPSV23) 07/27/2012  . INFLUENZA VACCINE  03/16/2016 (Originally 02/25/2016)  . ZOSTAVAX   03/16/2016 (Originally 10/01/2002)  . HEMOGLOBIN A1C  03/16/2016  . COLONOSCOPY  12/25/2019      Plan:     Declined TD and PSV 23; States he will not take with Humira; was educated these vaccines are not "live".   Discussed nutrition and inc'd fat intake; Voices little motivation to change at this time; Did not have time to coach regarding his food choices; He did state he bought a grill recently;  Instructed to consider going to the diabetes and nutrition center at [facility]; Given their number if he changes his mind.  Will carry sugar with him for BS levels; Given information on tracking BS fasting and 2 hours pc and why this is important  During the course of the visit the patient was educated and counseled about the following appropriate screening and preventive services:   Vaccines to include Pneumoccal, Influenza, Hepatitis B, Td, Zostavax, HCV/ declines pneumonia and tetanus  Electrocardiogram 05/2015  Cardiovascular Disease /HTN elevated but not taking lasix;   Colorectal cancer screening/ completed 11/2014 stated he had 10 years but was told the report stated 3 years  Diabetes screening/ elevated and discussed; does not know why;   Prostate Cancer Screening- deferred   Glaucoma screening/ states annual eye check  Nutrition counseling / discussed grilled food; eats a lot of pork and fat in diet   Smoking cessation counseling/ discussed possible CT of lung due to 30 pack year hx; does not voice interest at this time.  Patient Instructions (the written plan) was given to the patient.    Wynetta Fines, RN  12/16/2015

## 2015-12-16 NOTE — Assessment & Plan Note (Signed)
Norco prn 

## 2015-12-16 NOTE — Progress Notes (Signed)
Subjective:  Patient ID: Michael [name], male    DOB: Feb 04, 1941  Age: 75 y.o. MRN: [id number]  CC: Medicare Wellness   HPI   Michael [name] presents for DM, HTN, CAD f/u. Pt refused shots    Outpatient Prescriptions Prior to Visit  Medication Sig Dispense Refill  . acetaminophen (TYLENOL) 650 MG CR tablet Take 1,300 mg by mouth every 8 (eight) hours as needed for pain.     [name] allopurinol (ZYLOPRIM) 100 MG tablet TAKE 2 TABLETS BY MOUTH DAILY 60 tablet 11  . amiodarone (PACERONE) 200 MG tablet Take 1 tablet (200 mg total) by mouth daily. 30 tablet 11  . apixaban (ELIQUIS) 5 MG TABS tablet Take 1 tablet (5 mg total) by mouth 2 (two) times daily. 60 tablet 5  . atorvastatin (LIPITOR) 40 MG tablet Take 1 tablet (40 mg total) by mouth daily. 30 tablet 6  . Blood Glucose Monitoring Suppl (ONE TOUCH ULTRA 2) W/DEVICE KIT Use as directed Dx E11.9 1 each 0  . Blood Pressure Monitor KIT Use to check blood pressure daily Dx I10 1 each 0  . Cholecalciferol 1000 UNITS TBDP Take 1,000 Units by mouth daily.    [name] diltiazem (CARDIZEM CD) 180 MG 24 hr capsule Take 1 capsule (180 mg total) by mouth daily. 30 capsule 11  . ergocalciferol (VITAMIN D2) 50000 UNITS capsule Take 1 capsule (50,000 Units total) by mouth once a week. 6 capsule 0  . fenofibrate 160 MG tablet TAKE 1 TABLET(160 MG) BY MOUTH DAILY 30 tablet 11  . fenofibrate 160 MG tablet TAKE 1 TABLET(160 MG) BY MOUTH DAILY 30 tablet 11  . ferrous sulfate 325 (65 FE) MG tablet Take 325 mg by mouth 3 (three) times daily with meals.    [name] glipiZIDE (GLUCOTROL) 10 MG tablet TAKE ONE TABLET BY MOUTH ONCE DAILY IN THE MORNING. 30 tablet 5  . HUMIRA PEN 40 MG/0.8ML PNKT Inject 40 mg into the skin every 14 (fourteen) days.    . Lancets (ONETOUCH ULTRASOFT) lancets Use to check blood sugars daily 30 each 11  . levothyroxine (SYNTHROID, LEVOTHROID) 150 MCG tablet Take 1 tablet (150 mcg total) by mouth daily before breakfast. 90 tablet 3  . lisinopril  (PRINIVIL,ZESTRIL) 20 MG tablet TAKE 1 TABLET BY MOUTH EVERY MORNING 90 tablet 3  . metoprolol (LOPRESSOR) 50 MG tablet TAKE 1 TABLET(50 MG) BY MOUTH TWICE DAILY 180 tablet 1  . omeprazole (PRILOSEC) 40 MG capsule TAKE 1 CAPSULE BY MOUTH EVERY DAY 90 capsule 1  . ONE TOUCH ULTRA TEST test strip CHECK BLOOD GLUCOSE (SUGAR) DAILY 100 each 0  . valACYclovir (VALTREX) 1000 MG tablet Take 1 g by mouth daily.  0  . clobetasol (TEMOVATE) 0.05 % external solution Apply 1 application topically 2 (two) times daily. Reported on 12/16/2015    . furosemide (LASIX) 20 MG tablet TAKE 1 TABLET(20 MG) BY MOUTH DAILY (Patient not taking: Reported on 12/16/2015) 90 tablet 0  . HYDROcodone-acetaminophen (NORCO/VICODIN) 5-325 MG per tablet Take 1 tablet by mouth 2 (two) times daily as needed for moderate pain or severe pain. (Patient not taking: Reported on 12/16/2015) 100 tablet 0  . predniSONE (DELTASONE) 5 MG tablet Take 5 mg by mouth daily as needed (hand swelling). Reported on 12/16/2015    . triamcinolone cream (KENALOG) 0.1 % Apply 1 application topically 2 (two) times daily. (Patient not taking: Reported on 12/16/2015) 80 g 3   No facility-administered medications prior to visit.    ROS  Review of Systems  Constitutional: Negative for appetite change, fatigue and unexpected weight change.  HENT: Negative for congestion, nosebleeds, sneezing, sore throat and trouble swallowing.   Eyes: Negative for itching and visual disturbance.  Respiratory: Negative for cough.   Cardiovascular: Negative for chest pain, palpitations and leg swelling.  Gastrointestinal: Negative for nausea, diarrhea, blood in stool and abdominal distention.  Genitourinary: Negative for frequency and hematuria.  Musculoskeletal: Positive for back pain and arthralgias. Negative for joint swelling, gait problem and neck pain.  Skin: Negative for rash.  Neurological: Negative for dizziness, tremors, speech difficulty and weakness.    Psychiatric/Behavioral: Negative for suicidal ideas, sleep disturbance, dysphoric mood and agitation. The patient is not nervous/anxious.     Objective:  BP 174/70 mmHg  Pulse 63  Temp(Src) 97.7 F (36.5 C)  Ht _0  (1.803 m)  Wt 262 lb 12 oz (119.183 kg)  BMI 36.66 kg/m2  SpO2 98%  BP Readings from Last 3 Encounters:  12/16/15 174/70  09/17/15 140/60  06/25/15 112/64    Wt Readings from Last 3 Encounters:  12/16/15 262 lb 12 oz (119.183 kg)  09/17/15 265 lb (120.203 kg)  06/25/15 260 lb (117.935 kg)    Physical Exam  Constitutional: He is oriented to person, place, and time. He appears well-developed. No distress.  NAD  HENT:  Mouth/Throat: Oropharynx is clear and moist.  Eyes: Conjunctivae are normal. Pupils are equal, round, and reactive to light.  Neck: Normal range of motion. No JVD present. No thyromegaly present.  Cardiovascular: Normal rate, regular rhythm, normal heart sounds and intact distal pulses.  Exam reveals no gallop and no friction rub.   No murmur heard. Pulmonary/Chest: Effort normal and breath sounds normal. No respiratory distress. He has no wheezes. He has no rales. He exhibits no tenderness.  Abdominal: Soft. Bowel sounds are normal. He exhibits no distension and no mass. There is no tenderness. There is no rebound and no guarding.  Musculoskeletal: Normal range of motion. He exhibits tenderness. He exhibits no edema.  Lymphadenopathy:    He has no cervical adenopathy.  Neurological: He is alert and oriented to person, place, and time. He has normal reflexes. No cranial nerve deficit. He exhibits normal muscle tone. He displays a negative Romberg sign. Coordination and gait normal.  Skin: Skin is warm and dry. No rash noted.  Psychiatric: He has a normal mood and affect. His behavior is normal. Judgment and thought content normal.  Stiff hands, LS  Obese  Lab Results  Component Value Date   WBC 5.5 01/20/2015   HGB 12.1* 01/20/2015   HCT  34.7* 01/20/2015   PLT 171 01/20/2015   GLUCOSE 281* 09/17/2015   CHOL 100 03/20/2015   TRIG * 03/20/2015    795.0 Triglyceride is over 400; calculations on Lipids are invalid.   HDL 22.30* 03/20/2015   LDLDIRECT 26.0 03/20/2015   LDLCALC UNABLE TO CALCULATE IF TRIGLYCERIDE OVER 400 mg/dL 10/12/2014   ALT 15 03/20/2015   AST 18 03/20/2015   NA 133* 09/17/2015   K 4.7 09/17/2015   CL 101 09/17/2015   CREATININE 1.63* 09/17/2015   BUN 29* 09/17/2015   CO2 26 09/17/2015   TSH 1.89 09/17/2015   PSA 0.50 11/07/2013   INR 1.17 01/20/2015   HGBA1C 8.3* 09/17/2015   MICROALBUR 1.2 05/16/2013    No results found.  Assessment & Plan:   There are no diagnoses linked to this encounter. I am having Mr. Hanawalt maintain his acetaminophen, predniSONE, ferrous  sulfate, ergocalciferol, ONE TOUCH ULTRA 2, onetouch ultrasoft, Cholecalciferol, clobetasol, diltiazem, furosemide, triamcinolone cream, valACYclovir, allopurinol, levothyroxine, HYDROcodone-acetaminophen, HUMIRA PEN, amiodarone, atorvastatin, fenofibrate, fenofibrate, ONE TOUCH ULTRA TEST, Blood Pressure Monitor, glipiZIDE, omeprazole, apixaban, metoprolol, and lisinopril.  No orders of the defined types were placed in this encounter.     Follow-up: No Follow-up on file.  [name], MD

## 2015-12-16 NOTE — Assessment & Plan Note (Signed)
Discussed risks

## 2015-12-18 ENCOUNTER — Other Ambulatory Visit: Payer: Self-pay | Admitting: Internal Medicine

## 2015-12-18 MED ORDER — METFORMIN HCL 500 MG PO TABS: 500.0000 mg | ORAL_TABLET | Freq: Every day | ORAL | Status: DC

## 2015-12-20 ENCOUNTER — Encounter: Payer: Self-pay | Admitting: Cardiovascular Disease

## 2015-12-20 ENCOUNTER — Ambulatory Visit (INDEPENDENT_AMBULATORY_CARE_PROVIDER_SITE_OTHER): Payer: Medicare Other | Admitting: Cardiovascular Disease

## 2015-12-20 VITALS — BP 160/66 | HR 68 | Ht 71.0 in | Wt 263.8 lb

## 2015-12-20 DIAGNOSIS — R0989 Other specified symptoms and signs involving the circulatory and respiratory systems: Secondary | ICD-10-CM

## 2015-12-20 DIAGNOSIS — I6523 Occlusion and stenosis of bilateral carotid arteries: Secondary | ICD-10-CM | POA: Diagnosis not present

## 2015-12-20 NOTE — Progress Notes (Signed)
Patient ID: Michael [name], male   DOB: 11/24/1940, 75 y.o.   MRN: [id number]     Cardiology Office Note   Date:  12/20/2015   ID:  Michael, [name] [birth year], MRN [id number]  PCP:  [name], MD  Cardiologist:   Jenkins Rouge, MD   No chief complaint on file.     History of Present Illness: Michael [name] is a 75 y.o. male who presents for evaluation of PVD with right subclavian stenosis and PAF on eliquis for anticoagulaiton PVD followed by Dr Fletcher Anon Previous patient of Dr Mare Ferrari.  He was admitted in March 2016 with atrial fibrillation with RVR. He converted to sinus rhythm with rate control therapy. He was placed on Eliquis for anticoagulation. He had elevated troponins. This was felt to be related to demand ischemia. Echocardiogram demonstrated normal LV function. Inpatient stress testing demonstrated no ischemia. He was seen in follow-up by Ignacia Bayley, NP 11/07/14.   Admitted 4/19-4/22 with chest pain in the setting of atrial fibrillation with RVR. He had evidence of vascular congestion and mild interstitial edema on chest x-ray and was given a dose of IV Lasix. He was also placed on amiodarone and converted to normal sinus rhythm. He again had elevated troponins. Cardiac catheterization demonstrated mild to moderate diagonal, circumflex and OM disease, normal LV function and borderline significant innominate artery stenosis. The patient underwent carotid US and upper extremity arterial duplex. He has bilateral ICA stenosis 40-59%. There was evidence of more proximal obstruction in the right upper extremity based upon waveform dampening. The patient went on to have a CT angiogram which showed 50% stenosis of the right subclavian artery  F/U with Dr Fletcher Anon no intervention planned felt a lot of his symptoms from psoriasis He sees Dr Charlestine Night for this and [place]  Most recent carotid 12/12/15 reviewed and bilateral 40-59% ICA stenosis     Past Medical History  Diagnosis Date    . Hypertension   . Hyperlipidemia   . Gout   . Arthritis   . Hypothyroidism   . Personal history of colonic polyps 2007, 2008    adenoma each time, largest 12 mm in 2007  . Iron deficiency anemia   . AVM (arteriovenous malformation) of colon   . Allergy   . H/O transfusion of whole blood   . PAF (paroxysmal atrial fibrillation) ([name])     a. 09/2014: Converted on Dilt;  b. CHA2DS2VASc = 3-->eliquis;  c. 09/2014 Echo: EF 55-60%, mild LVH, mildly dil LA.  [name] Elevated troponin     a. 09/2014 in setting of AF RVR-->Myoview: EF 49%, no ischemia/infarct->Med Rx.  [name] Dysrhythmia     ATRIAL FIBRILATION  . Type II diabetes mellitus ([place])     TYPE 2  . GERD (gastroesophageal reflux disease)   . Psoriasis   . CAD (coronary artery disease)     a. Cath 10/2014 - Mild to moderate diagonal, circumflex and obtuse marginal disease. Innominate artery sternosis.  . Carotid artery disease ([place])     a. Carotid dopple 03/2014 62-83% RICA &  66-29% LICA stenosis b. 4/76  . History of PFTs     PFTs 6/16:  FVC 3.73 (87%), FEV1 2.93 (88%), FEV1/FVC 78%, DLCO 69%    Past Surgical History  Procedure Laterality Date  . Rotator cuff repair      left  . Knee arthroscopy      right  . Colonoscopy w/ polypectomy  04/09/2006    12 mm  adenoma  . Colonoscopy w/ polypectomy  06/17/2007    5 mm adenoma  . Colonoscopy  02/10/2011    internal hemorrhoids  . Esophagogastroduodenoscopy  12/23/2011    Procedure: ESOPHAGOGASTRODUODENOSCOPY (EGD);  Surgeon: Irene Shipper, MD;  Location: [facility];  Service: Endoscopy;  Laterality: N/A;  with small bowel bx's  . Givens capsule study  12/28/2011  . Laparoscopic appendectomy N/A 02/09/2014    Procedure: APPENDECTOMY LAPAROSCOPIC;  Surgeon: Leighton Ruff, MD;  Location: [facility];  Service: General;  Laterality: N/A;  . Appendectomy  02/09/2014  . Left heart catheterization with coronary angiogram N/A 11/15/2014    Procedure: LEFT HEART CATHETERIZATION WITH CORONARY ANGIOGRAM;   Surgeon: Belva Crome, MD;  Location: [facility] CATH LAB;  Service: Cardiovascular;  Laterality: N/A;     Current Outpatient Prescriptions  Medication Sig Dispense Refill  . acetaminophen (TYLENOL) 650 MG CR tablet Take 1,300 mg by mouth every 8 (eight) hours as needed for pain.     [name] allopurinol (ZYLOPRIM) 100 MG tablet TAKE 2 TABLETS BY MOUTH DAILY 60 tablet 11  . amiodarone (PACERONE) 200 MG tablet Take 1 tablet (200 mg total) by mouth daily. 30 tablet 11  . apixaban (ELIQUIS) 5 MG TABS tablet Take 1 tablet (5 mg total) by mouth 2 (two) times daily. 60 tablet 5  . atorvastatin (LIPITOR) 40 MG tablet Take 1 tablet (40 mg total) by mouth daily. 30 tablet 6  . Blood Glucose Monitoring Suppl (ONE TOUCH ULTRA 2) W/DEVICE KIT Use as directed Dx E11.9 1 each 0  . Blood Pressure Monitor KIT Use to check blood pressure daily Dx I10 1 each 0  . Cholecalciferol 1000 UNITS TBDP Take 1,000 Units by mouth daily.    . clobetasol (TEMOVATE) 0.05 % external solution Apply 1 application topically 2 (two) times daily. Reported on 12/16/2015    . diltiazem (CARDIZEM CD) 180 MG 24 hr capsule Take 1 capsule (180 mg total) by mouth daily. 30 capsule 10  . ergocalciferol (VITAMIN D2) 50000 UNITS capsule Take 1 capsule (50,000 Units total) by mouth once a week. 6 capsule 0  . fenofibrate 160 MG tablet TAKE 1 TABLET(160 MG) BY MOUTH DAILY 30 tablet 11  . ferrous sulfate 325 (65 FE) MG tablet Take 325 mg by mouth 3 (three) times daily with meals.    . furosemide (LASIX) 20 MG tablet Take 1 tablet (20 mg total) by mouth daily. 90 tablet 3  . glipiZIDE (GLUCOTROL) 10 MG tablet TAKE ONE TABLET BY MOUTH ONCE DAILY IN THE MORNING. 30 tablet 5  . HUMIRA PEN 40 MG/0.8ML PNKT Inject 40 mg into the skin every 14 (fourteen) days.    [name] HYDROcodone-acetaminophen (NORCO/VICODIN) 5-325 MG tablet Take 1 tablet by mouth 2 (two) times daily as needed for moderate pain or severe pain. 100 tablet 0  . Lancets (ONETOUCH ULTRASOFT) lancets  Use to check blood sugars daily 30 each 11  . levothyroxine (SYNTHROID, LEVOTHROID) 150 MCG tablet Take 1 tablet (150 mcg total) by mouth daily before breakfast. 90 tablet 3  . lisinopril (PRINIVIL,ZESTRIL) 20 MG tablet TAKE 1 TABLET BY MOUTH EVERY MORNING 90 tablet 3  . metFORMIN (GLUCOPHAGE) 500 MG tablet Take 1 tablet (500 mg total) by mouth daily with breakfast. 30 tablet 11  . metoprolol (LOPRESSOR) 50 MG tablet TAKE 1 TABLET(50 MG) BY MOUTH TWICE DAILY 180 tablet 1  . omeprazole (PRILOSEC) 40 MG capsule TAKE 1 CAPSULE BY MOUTH EVERY DAY 90 capsule 1  . ONE TOUCH  ULTRA TEST test strip CHECK BLOOD GLUCOSE (SUGAR) DAILY 100 each 0  . predniSONE (DELTASONE) 5 MG tablet Take 5 mg by mouth daily as needed (hand swelling). Reported on 12/16/2015    . triamcinolone cream (KENALOG) 0.1 % Apply 1 application topically 2 (two) times daily. 80 g 3  . valACYclovir (VALTREX) 1000 MG tablet Take 1 g by mouth daily.  0   No current facility-administered medications for this visit.    Allergies:   Percocet    Social History:  The patient  reports that he quit smoking about 9 years ago. His smoking use included Cigarettes. He has a 33.6 pack-year smoking history. He has never used smokeless tobacco. He reports that he drinks about 0.6 oz of alcohol per week. He reports that he does not use illicit drugs.   Family History:  The patient's family history includes Cancer in his brother; Diabetes in his father; Heart attack in his father; Heart disease in his brother; Hypertension in his father and mother; Lung cancer in his father; Stroke in his father and mother. There is no history of Malignant hyperthermia.    ROS:  Please see the history of present illness.   Otherwise, review of systems are positive for none.   All other systems are reviewed and negative.    PHYSICAL EXAM: VS:  BP 160/66 mmHg  Pulse 68  Ht _0  (1.803 m)  Wt 119.659 kg (263 lb 12.8 oz)  BMI 36.81 kg/m2 , BMI Body mass index is  36.81 kg/(m^2). Affect appropriate Obese white male  HEENT: normal Neck supple with no adenopathy JVP normal bilateral  bruits no thyromegaly bilateral subclavian bruits Decreased right radial pulse  Lungs clear with no wheezing and good diaphragmatic motion Heart:  S1/S2SEM  murmur, no rub, gallop or click PMI normal Abdomen: benighn, BS positve, no tenderness, no AAA no bruit.  No HSM or HJR Distal pulses intact with no bruits No edema Neuro non-focal Skin warm and dry No muscular weakness    EKG:  06/25/15 SR rate 58 nonspecific ST changes    Recent Labs: 01/20/2015: Hemoglobin 12.1*; Platelets 171 03/20/2015: ALT 15 09/17/2015: TSH 1.89 12/16/2015: BUN 16; Creatinine, Ser 1.39; Potassium 4.5; Sodium 132*    Lipid Panel    Component Value Date/Time   CHOL 100 03/20/2015 1003   TRIG * 03/20/2015 1003    795.0 Triglyceride is over 400; calculations on Lipids are invalid.   HDL 22.30* 03/20/2015 1003   CHOLHDL 4 03/20/2015 1003   VLDL UNABLE TO CALCULATE IF TRIGLYCERIDE OVER 400 mg/dL 10/12/2014 0248   LDLCALC UNABLE TO CALCULATE IF TRIGLYCERIDE OVER 400 mg/dL 10/12/2014 0248   LDLDIRECT 26.0 03/20/2015 1003      Wt Readings from Last 3 Encounters:  12/20/15 119.659 kg (263 lb 12.8 oz)  12/16/15 119.183 kg (262 lb 12 oz)  09/17/15 120.203 kg (265 lb)      Other studies Reviewed: Additional studies/ records that were reviewed today include: notes Dr Brunetta Genera [name] studies cath and labs .    ASSESSMENT AND PLAN:  1. PAF:  maint NSR on Eliquis continue pacerone and cardizem  2. CAD:  Moderate by cath 10/2015 continue asa and beta blocker 3. PVD:  Diffuse bruits shoulders, carotids Right subclavian stenosis with no arm weakness F/u DR Fletcher Anon in November 4. DM  Discussed low carb diet.  Target hemoglobin A1c is 6.5 or less.  Continue current medications. 5. Chol:  On statin and fibrates for triglycerides.  Diet discussed  6. HTN:  Well controlled.  Continue  current medications and low sodium Dash type diet.   7. Murmur:  AV sclerosis no stenosis by echo 2016 observe      Current medicines are reviewed at length with the patient today.  The patient does not have concerns regarding medicines.  The following changes have been made:  no change  Labs/ tests ordered today include: none   No orders of the defined types were placed in this encounter.     Disposition:   FU with Arida 6 months and me in a year     Signed, Jenkins Rouge, MD  12/20/2015 11:12 AM    [facility] [address], [place], [place]  [address] Phone: [phone]; Fax: [phone]

## 2015-12-20 NOTE — Patient Instructions (Addendum)
Medication Instructions:  Your physician recommends that you continue on your current medications as directed. Please refer to the Current Medication list given to you today.  Labwork: NONE  Testing/Procedures: NONE  Follow-Up: Your physician wants you to follow-up in : 6 month with Dr. Fletcher Anon. You will receive a reminder letter in the mail two months in advance. If you don't receive a letter, please call our office to schedule the follow-up appointment.   Your physician wants you to follow-up in: 12 months with Dr. Johnsie Cancel. You will receive a reminder letter in the mail two months in advance. If you don't receive a letter, please call our office to schedule the follow-up appointment.   If you need a refill on your cardiac medications before your next appointment, please call your pharmacy.

## 2016-01-21 ENCOUNTER — Other Ambulatory Visit: Payer: Self-pay | Admitting: Internal Medicine

## 2016-01-27 ENCOUNTER — Other Ambulatory Visit: Payer: Self-pay | Admitting: Internal Medicine

## 2016-02-03 ENCOUNTER — Other Ambulatory Visit: Payer: Self-pay | Admitting: *Deleted

## 2016-02-03 MED ORDER — ALLOPURINOL 100 MG PO TABS: 200.0000 mg | ORAL_TABLET | Freq: Every day | ORAL | Status: DC

## 2016-02-18 ENCOUNTER — Encounter: Payer: Self-pay | Admitting: Internal Medicine

## 2016-03-06 DIAGNOSIS — H43811 Vitreous degeneration, right eye: Secondary | ICD-10-CM | POA: Diagnosis not present

## 2016-03-06 DIAGNOSIS — H3582 Retinal ischemia: Secondary | ICD-10-CM | POA: Diagnosis not present

## 2016-03-06 DIAGNOSIS — H35363 Drusen (degenerative) of macula, bilateral: Secondary | ICD-10-CM | POA: Diagnosis not present

## 2016-03-06 DIAGNOSIS — H34811 Central retinal vein occlusion, right eye, with macular edema: Secondary | ICD-10-CM | POA: Diagnosis not present

## 2016-03-06 LAB — HM DIABETES EYE EXAM

## 2016-03-09 ENCOUNTER — Other Ambulatory Visit: Payer: Self-pay | Admitting: *Deleted

## 2016-03-09 ENCOUNTER — Other Ambulatory Visit: Payer: Self-pay | Admitting: Cardiovascular Disease

## 2016-03-09 MED ORDER — ATORVASTATIN CALCIUM 40 MG PO TABS: 40.0000 mg | ORAL_TABLET | Freq: Every day | ORAL | 2 refills | Status: DC

## 2016-03-14 ENCOUNTER — Other Ambulatory Visit: Payer: Self-pay | Admitting: Internal Medicine

## 2016-03-17 ENCOUNTER — Ambulatory Visit (INDEPENDENT_AMBULATORY_CARE_PROVIDER_SITE_OTHER): Payer: Medicare Other | Admitting: Internal Medicine

## 2016-03-17 ENCOUNTER — Other Ambulatory Visit (INDEPENDENT_AMBULATORY_CARE_PROVIDER_SITE_OTHER): Payer: Medicare Other

## 2016-03-17 ENCOUNTER — Inpatient Hospital Stay (HOSPITAL_COMMUNITY)
Admission: EM | Admit: 2016-03-17 | Discharge: 2016-03-21 | DRG: 871 | Disposition: A | Discharge status: Home / Self Care | Payer: Medicare Other | Attending: Internal Medicine | Admitting: Internal Medicine

## 2016-03-17 ENCOUNTER — Encounter: Payer: Self-pay | Admitting: Internal Medicine

## 2016-03-17 ENCOUNTER — Encounter (HOSPITAL_COMMUNITY): Payer: Self-pay

## 2016-03-17 ENCOUNTER — Emergency Department (HOSPITAL_COMMUNITY): Payer: Medicare Other

## 2016-03-17 VITALS — BP 140/60 | HR 86 | Wt 257.0 lb

## 2016-03-17 DIAGNOSIS — K219 Gastro-esophageal reflux disease without esophagitis: Secondary | ICD-10-CM | POA: Diagnosis present

## 2016-03-17 DIAGNOSIS — L080 Pyoderma: Secondary | ICD-10-CM

## 2016-03-17 DIAGNOSIS — N183 Chronic kidney disease, stage 3 unspecified: Secondary | ICD-10-CM | POA: Diagnosis present

## 2016-03-17 DIAGNOSIS — Z6834 Body mass index (BMI) 34.0-34.9, adult: Secondary | ICD-10-CM

## 2016-03-17 DIAGNOSIS — E860 Dehydration: Secondary | ICD-10-CM | POA: Diagnosis present

## 2016-03-17 DIAGNOSIS — E1122 Type 2 diabetes mellitus with diabetic chronic kidney disease: Secondary | ICD-10-CM | POA: Diagnosis present

## 2016-03-17 DIAGNOSIS — J189 Pneumonia, unspecified organism: Secondary | ICD-10-CM | POA: Diagnosis not present

## 2016-03-17 DIAGNOSIS — E1165 Type 2 diabetes mellitus with hyperglycemia: Secondary | ICD-10-CM

## 2016-03-17 DIAGNOSIS — I480 Paroxysmal atrial fibrillation: Secondary | ICD-10-CM | POA: Diagnosis present

## 2016-03-17 DIAGNOSIS — R079 Chest pain, unspecified: Secondary | ICD-10-CM | POA: Diagnosis not present

## 2016-03-17 DIAGNOSIS — N179 Acute kidney failure, unspecified: Secondary | ICD-10-CM

## 2016-03-17 DIAGNOSIS — L4050 Arthropathic psoriasis, unspecified: Secondary | ICD-10-CM | POA: Diagnosis not present

## 2016-03-17 DIAGNOSIS — A419 Sepsis, unspecified organism: Principal | ICD-10-CM

## 2016-03-17 DIAGNOSIS — I4819 Other persistent atrial fibrillation: Secondary | ICD-10-CM | POA: Diagnosis present

## 2016-03-17 DIAGNOSIS — Z79899 Other long term (current) drug therapy: Secondary | ICD-10-CM

## 2016-03-17 DIAGNOSIS — Z794 Long term (current) use of insulin: Secondary | ICD-10-CM

## 2016-03-17 DIAGNOSIS — E669 Obesity, unspecified: Secondary | ICD-10-CM | POA: Diagnosis present

## 2016-03-17 DIAGNOSIS — Z87891 Personal history of nicotine dependence: Secondary | ICD-10-CM

## 2016-03-17 DIAGNOSIS — D509 Iron deficiency anemia, unspecified: Secondary | ICD-10-CM | POA: Diagnosis not present

## 2016-03-17 DIAGNOSIS — E871 Hypo-osmolality and hyponatremia: Secondary | ICD-10-CM | POA: Diagnosis present

## 2016-03-17 DIAGNOSIS — E785 Hyperlipidemia, unspecified: Secondary | ICD-10-CM | POA: Diagnosis not present

## 2016-03-17 DIAGNOSIS — E781 Pure hyperglyceridemia: Secondary | ICD-10-CM | POA: Diagnosis present

## 2016-03-17 DIAGNOSIS — E1151 Type 2 diabetes mellitus with diabetic peripheral angiopathy without gangrene: Secondary | ICD-10-CM

## 2016-03-17 DIAGNOSIS — Z7901 Long term (current) use of anticoagulants: Secondary | ICD-10-CM | POA: Diagnosis not present

## 2016-03-17 DIAGNOSIS — D500 Iron deficiency anemia secondary to blood loss (chronic): Secondary | ICD-10-CM | POA: Diagnosis present

## 2016-03-17 DIAGNOSIS — I6523 Occlusion and stenosis of bilateral carotid arteries: Secondary | ICD-10-CM | POA: Diagnosis not present

## 2016-03-17 DIAGNOSIS — E039 Hypothyroidism, unspecified: Secondary | ICD-10-CM

## 2016-03-17 DIAGNOSIS — G2581 Restless legs syndrome: Secondary | ICD-10-CM | POA: Diagnosis present

## 2016-03-17 DIAGNOSIS — L409 Psoriasis, unspecified: Secondary | ICD-10-CM

## 2016-03-17 DIAGNOSIS — I129 Hypertensive chronic kidney disease with stage 1 through stage 4 chronic kidney disease, or unspecified chronic kidney disease: Secondary | ICD-10-CM | POA: Diagnosis present

## 2016-03-17 DIAGNOSIS — M109 Gout, unspecified: Secondary | ICD-10-CM | POA: Diagnosis present

## 2016-03-17 DIAGNOSIS — D899 Disorder involving the immune mechanism, unspecified: Secondary | ICD-10-CM | POA: Diagnosis present

## 2016-03-17 DIAGNOSIS — R21 Rash and other nonspecific skin eruption: Secondary | ICD-10-CM | POA: Diagnosis present

## 2016-03-17 DIAGNOSIS — N189 Chronic kidney disease, unspecified: Secondary | ICD-10-CM

## 2016-03-17 DIAGNOSIS — I2510 Atherosclerotic heart disease of native coronary artery without angina pectoris: Secondary | ICD-10-CM | POA: Diagnosis present

## 2016-03-17 DIAGNOSIS — I10 Essential (primary) hypertension: Secondary | ICD-10-CM | POA: Diagnosis present

## 2016-03-17 DIAGNOSIS — D638 Anemia in other chronic diseases classified elsewhere: Secondary | ICD-10-CM | POA: Diagnosis present

## 2016-03-17 DIAGNOSIS — Z7952 Long term (current) use of systemic steroids: Secondary | ICD-10-CM

## 2016-03-17 HISTORY — DX: Chronic kidney disease, stage 3 (moderate): N18.3

## 2016-03-17 HISTORY — DX: Chronic kidney disease, stage 3 unspecified: N18.30

## 2016-03-17 LAB — CBC
HEMATOCRIT: 32.3 % — AB (ref 39.0–52.0)
HEMOGLOBIN: 10.8 g/dL — AB (ref 13.0–17.0)
MCH: 29.8 pg (ref 26.0–34.0)
MCHC: 33.4 g/dL (ref 30.0–36.0)
MCV: 89.2 fL (ref 78.0–100.0)
Platelets: 150 10*3/uL (ref 150–400)
RBC: 3.62 MIL/uL — ABNORMAL LOW (ref 4.22–5.81)
RDW: 15.6 % — AB (ref 11.5–15.5)
WBC: 9 10*3/uL (ref 4.0–10.5)

## 2016-03-17 LAB — I-STAT TROPONIN, ED: TROPONIN I, POC: 0 ng/mL (ref 0.00–0.08)

## 2016-03-17 LAB — CBC WITH DIFFERENTIAL/PLATELET
BASOS ABS: 0 10*3/uL (ref 0.0–0.1)
Basophils Relative: 0.3 % (ref 0.0–3.0)
Eosinophils Absolute: 0 10*3/uL (ref 0.0–0.7)
Eosinophils Relative: 0.2 % (ref 0.0–5.0)
HEMATOCRIT: 30.9 % — AB (ref 39.0–52.0)
HEMOGLOBIN: 10.8 g/dL — AB (ref 13.0–17.0)
Lymphs Abs: 0.6 10*3/uL — ABNORMAL LOW (ref 0.7–4.0)
MCHC: 34.8 g/dL (ref 30.0–36.0)
MCV: 87.3 fl (ref 78.0–100.0)
MONOS PCT: 7.4 % (ref 3.0–12.0)
Monocytes Absolute: 0.8 10*3/uL (ref 0.1–1.0)
NEUTROS ABS: 9.4 10*3/uL — AB (ref 1.4–7.7)
Neutrophils Relative %: 86.5 % — ABNORMAL HIGH (ref 43.0–77.0)
Platelets: 180 10*3/uL (ref 150.0–400.0)
RBC: 3.55 Mil/uL — AB (ref 4.22–5.81)
RDW: 16.2 % — ABNORMAL HIGH (ref 11.5–15.5)
WBC: 10.8 10*3/uL — AB (ref 4.0–10.5)

## 2016-03-17 LAB — IBC PANEL
Iron: 21 ug/dL — ABNORMAL LOW (ref 42–165)
SATURATION RATIOS: 6.9 % — AB (ref 20.0–50.0)
TRANSFERRIN: 216 mg/dL (ref 212.0–360.0)

## 2016-03-17 LAB — I-STAT CG4 LACTIC ACID, ED
Lactic Acid, Venous: 1.9 mmol/L (ref 0.5–1.9)
Lactic Acid, Venous: 1.52 mmol/L (ref 0.5–1.9)

## 2016-03-17 LAB — BASIC METABOLIC PANEL
ANION GAP: 9 (ref 5–15)
BUN: 32 mg/dL — ABNORMAL HIGH (ref 6–23)
BUN: 32 mg/dL — AB (ref 6–20)
CALCIUM: 8.6 mg/dL (ref 8.4–10.5)
CALCIUM: 8.7 mg/dL — AB (ref 8.9–10.3)
CO2: 25 meq/L (ref 19–32)
CO2: 24 mmol/L (ref 22–32)
Chloride: 95 mEq/L — ABNORMAL LOW (ref 96–112)
Chloride: 98 mmol/L — ABNORMAL LOW (ref 101–111)
Creatinine, Ser: 1.88 mg/dL — ABNORMAL HIGH (ref 0.40–1.50)
Creatinine, Ser: 1.95 mg/dL — ABNORMAL HIGH (ref 0.61–1.24)
GFR calc Af Amer: 38 mL/min — ABNORMAL LOW (ref >60)
GFR, EST NON AFRICAN AMERICAN: 32 mL/min — AB (ref >60)
GFR: 37.53 mL/min — AB (ref >60.00)
GLUCOSE: 280 mg/dL — AB (ref 65–99)
Glucose, Bld: 340 mg/dL — ABNORMAL HIGH (ref 70–99)
Potassium: 5 mEq/L (ref 3.5–5.1)
Potassium: 4.5 mmol/L (ref 3.5–5.1)
SODIUM: 128 meq/L — AB (ref 135–145)
Sodium: 131 mmol/L — ABNORMAL LOW (ref 135–145)

## 2016-03-17 LAB — APTT: APTT: 46 s — AB (ref 24–36)

## 2016-03-17 LAB — HEMOGLOBIN A1C: HEMOGLOBIN A1C: 8.9 % — AB (ref 4.6–6.5)

## 2016-03-17 LAB — PROCALCITONIN: Procalcitonin: 0.45 ng/mL

## 2016-03-17 LAB — LACTIC ACID, PLASMA: LACTIC ACID, VENOUS: 1.5 mmol/L (ref 0.5–1.9)

## 2016-03-17 LAB — GLUCOSE, CAPILLARY: Glucose-Capillary: 239 mg/dL — ABNORMAL HIGH (ref 65–99)

## 2016-03-17 LAB — PROTIME-INR
INR: 1.62
Prothrombin Time: 19.4 seconds — ABNORMAL HIGH (ref 11.4–15.2)

## 2016-03-17 MED ORDER — METOPROLOL TARTRATE 50 MG PO TABS
50.0000 mg | ORAL_TABLET | Freq: Two times a day (BID) | ORAL | Status: DC
  Administered 2016-03-18 – 2016-03-21 (×8): 50 mg via ORAL
  Filled 2016-03-17 (×8): qty 1

## 2016-03-17 MED ORDER — CLOBETASOL PROPIONATE 0.05 % EX SOLN: 1.0000 "application " | Freq: Two times a day (BID) | CUTANEOUS | Status: DC

## 2016-03-17 MED ORDER — INSULIN PEN NEEDLE 31G X 5 MM MISC: 1.0000 [IU] | Freq: Four times a day (QID) | 11 refills | Status: DC

## 2016-03-17 MED ORDER — ACETAMINOPHEN 325 MG PO TABS
ORAL_TABLET | ORAL | Status: AC
  Filled 2016-03-17: qty 2

## 2016-03-17 MED ORDER — ALLOPURINOL 100 MG PO TABS
200.0000 mg | ORAL_TABLET | Freq: Every day | ORAL | Status: DC
  Administered 2016-03-18 – 2016-03-21 (×4): 200 mg via ORAL
  Filled 2016-03-17 (×4): qty 2

## 2016-03-17 MED ORDER — DOXYCYCLINE HYCLATE 100 MG PO TABS: 100.0000 mg | ORAL_TABLET | Freq: Two times a day (BID) | ORAL | 0 refills | Status: DC

## 2016-03-17 MED ORDER — VANCOMYCIN HCL 10 G IV SOLR
2000.0000 mg | Freq: Once | INTRAVENOUS | Status: AC
  Administered 2016-03-18: 2000 mg via INTRAVENOUS
  Filled 2016-03-17 (×2): qty 2000

## 2016-03-17 MED ORDER — PIPERACILLIN-TAZOBACTAM 3.375 G IVPB 30 MIN: 3.3750 g | Freq: Once | INTRAVENOUS | Status: DC

## 2016-03-17 MED ORDER — INSULIN GLARGINE 300 UNIT/ML ~~LOC~~ SOPN: 11.0000 [IU] | PEN_INJECTOR | Freq: Every morning | SUBCUTANEOUS | 5 refills | Status: DC

## 2016-03-17 MED ORDER — HYDROCODONE-ACETAMINOPHEN 5-325 MG PO TABS: 1.0000 | ORAL_TABLET | Freq: Two times a day (BID) | ORAL | Status: DC | PRN

## 2016-03-17 MED ORDER — APIXABAN 5 MG PO TABS
5.0000 mg | ORAL_TABLET | Freq: Two times a day (BID) | ORAL | Status: DC
  Administered 2016-03-18 – 2016-03-21 (×8): 5 mg via ORAL
  Filled 2016-03-17 (×9): qty 1

## 2016-03-17 MED ORDER — VALACYCLOVIR HCL 500 MG PO TABS
1000.0000 mg | ORAL_TABLET | Freq: Every day | ORAL | Status: DC
  Administered 2016-03-18 – 2016-03-21 (×4): 1000 mg via ORAL
  Filled 2016-03-17 (×4): qty 2

## 2016-03-17 MED ORDER — VANCOMYCIN HCL 10 G IV SOLR
1250.0000 mg | INTRAVENOUS | Status: AC
  Administered 2016-03-19 – 2016-03-20 (×2): 1250 mg via INTRAVENOUS
  Filled 2016-03-17 (×3): qty 1250

## 2016-03-17 MED ORDER — FERROUS SULFATE 325 (65 FE) MG PO TABS
325.0000 mg | ORAL_TABLET | Freq: Three times a day (TID) | ORAL | Status: DC
  Administered 2016-03-18 – 2016-03-21 (×11): 325 mg via ORAL
  Filled 2016-03-17 (×11): qty 1

## 2016-03-17 MED ORDER — ALBUTEROL SULFATE (2.5 MG/3ML) 0.083% IN NEBU: 2.5000 mg | INHALATION_SOLUTION | RESPIRATORY_TRACT | Status: DC | PRN

## 2016-03-17 MED ORDER — INSULIN GLARGINE 100 UNIT/ML ~~LOC~~ SOLN
8.0000 [IU] | Freq: Every morning | SUBCUTANEOUS | Status: DC
  Administered 2016-03-18 – 2016-03-21 (×4): 8 [IU] via SUBCUTANEOUS
  Filled 2016-03-17 (×4): qty 0.08

## 2016-03-17 MED ORDER — DIPHENHYDRAMINE HCL 50 MG/ML IJ SOLN
25.0000 mg | Freq: Once | INTRAMUSCULAR | Status: AC
  Administered 2016-03-17: 25 mg via INTRAVENOUS
  Filled 2016-03-17: qty 1

## 2016-03-17 MED ORDER — ACETAMINOPHEN 325 MG PO TABS
650.0000 mg | ORAL_TABLET | Freq: Once | ORAL | Status: AC | PRN
  Administered 2016-03-17: 650 mg via ORAL

## 2016-03-17 MED ORDER — AMIODARONE HCL 200 MG PO TABS
200.0000 mg | ORAL_TABLET | Freq: Every day | ORAL | Status: DC
  Administered 2016-03-18 – 2016-03-21 (×4): 200 mg via ORAL
  Filled 2016-03-17 (×4): qty 1

## 2016-03-17 MED ORDER — VITAMIN D 1000 UNITS PO TABS
1000.0000 [IU] | ORAL_TABLET | Freq: Every day | ORAL | Status: DC
  Administered 2016-03-18 – 2016-03-21 (×4): 1000 [IU] via ORAL
  Filled 2016-03-17 (×4): qty 1

## 2016-03-17 MED ORDER — DILTIAZEM HCL ER COATED BEADS 180 MG PO CP24
180.0000 mg | ORAL_CAPSULE | Freq: Every day | ORAL | Status: DC
  Administered 2016-03-18 – 2016-03-21 (×4): 180 mg via ORAL
  Filled 2016-03-17 (×4): qty 1

## 2016-03-17 MED ORDER — ACETAMINOPHEN 325 MG PO TABS
650.0000 mg | ORAL_TABLET | Freq: Four times a day (QID) | ORAL | Status: AC | PRN
  Administered 2016-03-18: 650 mg via ORAL
  Filled 2016-03-17: qty 2

## 2016-03-17 MED ORDER — SODIUM CHLORIDE 0.9 % IV BOLUS (SEPSIS)
1000.0000 mL | Freq: Once | INTRAVENOUS | Status: AC
  Administered 2016-03-17: 1000 mL via INTRAVENOUS

## 2016-03-17 MED ORDER — LEVOFLOXACIN IN D5W 750 MG/150ML IV SOLN
750.0000 mg | INTRAVENOUS | Status: DC
  Administered 2016-03-17: 750 mg via INTRAVENOUS
  Filled 2016-03-17: qty 150

## 2016-03-17 MED ORDER — LEVOTHYROXINE SODIUM 150 MCG PO TABS
150.0000 ug | ORAL_TABLET | Freq: Every day | ORAL | Status: DC
  Administered 2016-03-18 – 2016-03-21 (×4): 150 ug via ORAL
  Filled 2016-03-17: qty 2
  Filled 2016-03-17 (×2): qty 1
  Filled 2016-03-17: qty 2
  Filled 2016-03-17: qty 1
  Filled 2016-03-17: qty 2
  Filled 2016-03-17: qty 1

## 2016-03-17 MED ORDER — FENOFIBRATE 160 MG PO TABS
160.0000 mg | ORAL_TABLET | Freq: Every day | ORAL | Status: DC
  Administered 2016-03-18 – 2016-03-21 (×4): 160 mg via ORAL
  Filled 2016-03-17 (×4): qty 1

## 2016-03-17 MED ORDER — METOCLOPRAMIDE HCL 5 MG/ML IJ SOLN
10.0000 mg | Freq: Once | INTRAMUSCULAR | Status: AC
  Administered 2016-03-17: 10 mg via INTRAVENOUS
  Filled 2016-03-17: qty 2

## 2016-03-17 MED ORDER — INSULIN ASPART 100 UNIT/ML ~~LOC~~ SOLN
0.0000 [IU] | Freq: Three times a day (TID) | SUBCUTANEOUS | Status: DC
  Administered 2016-03-18 (×2): 5 [IU] via SUBCUTANEOUS
  Administered 2016-03-18: 3 [IU] via SUBCUTANEOUS
  Administered 2016-03-19: 5 [IU] via SUBCUTANEOUS
  Administered 2016-03-19 (×2): 3 [IU] via SUBCUTANEOUS
  Administered 2016-03-20 (×2): 5 [IU] via SUBCUTANEOUS
  Administered 2016-03-20: 3 [IU] via SUBCUTANEOUS
  Administered 2016-03-21: 7 [IU] via SUBCUTANEOUS
  Administered 2016-03-21: 3 [IU] via SUBCUTANEOUS

## 2016-03-17 MED ORDER — ATORVASTATIN CALCIUM 40 MG PO TABS
40.0000 mg | ORAL_TABLET | Freq: Every day | ORAL | Status: DC
  Administered 2016-03-18 – 2016-03-20 (×3): 40 mg via ORAL
  Filled 2016-03-17 (×3): qty 1

## 2016-03-17 MED ORDER — PANTOPRAZOLE SODIUM 40 MG PO TBEC
40.0000 mg | DELAYED_RELEASE_TABLET | Freq: Every day | ORAL | Status: DC
  Administered 2016-03-18 – 2016-03-21 (×4): 40 mg via ORAL
  Filled 2016-03-17 (×4): qty 1

## 2016-03-17 MED ORDER — PIPERACILLIN-TAZOBACTAM 3.375 G IVPB
3.3750 g | Freq: Three times a day (TID) | INTRAVENOUS | Status: DC
  Administered 2016-03-18 – 2016-03-20 (×7): 3.375 g via INTRAVENOUS
  Filled 2016-03-17 (×9): qty 50

## 2016-03-17 MED ORDER — DOXYCYCLINE HYCLATE 100 MG PO TABS
100.0000 mg | ORAL_TABLET | Freq: Two times a day (BID) | ORAL | Status: DC
  Administered 2016-03-18 – 2016-03-21 (×7): 100 mg via ORAL
  Filled 2016-03-17 (×8): qty 1

## 2016-03-17 MED ORDER — DM-GUAIFENESIN ER 30-600 MG PO TB12: 1.0000 | ORAL_TABLET | Freq: Two times a day (BID) | ORAL | Status: DC | PRN

## 2016-03-17 MED ORDER — HYDRALAZINE HCL 20 MG/ML IJ SOLN: 5.0000 mg | INTRAMUSCULAR | Status: DC | PRN

## 2016-03-17 MED ORDER — SODIUM CHLORIDE 0.9 % IV SOLN
INTRAVENOUS | Status: DC
  Administered 2016-03-18: 02:00:00 via INTRAVENOUS

## 2016-03-17 MED ORDER — SODIUM CHLORIDE 0.9 % IV SOLN
INTRAVENOUS | Status: DC
  Administered 2016-03-18: 01:00:00 via INTRAVENOUS

## 2016-03-17 MED ORDER — LEVOFLOXACIN IN D5W 750 MG/150ML IV SOLN: 750.0000 mg | INTRAVENOUS | Status: DC

## 2016-03-17 MED ORDER — CLOBETASOL PROPIONATE 0.05 % EX CREA
TOPICAL_CREAM | Freq: Two times a day (BID) | CUTANEOUS | Status: DC
  Filled 2016-03-17: qty 15

## 2016-03-17 MED ORDER — TRIAMCINOLONE ACETONIDE 0.1 % EX CREA
1.0000 "application " | TOPICAL_CREAM | Freq: Two times a day (BID) | CUTANEOUS | Status: DC
  Filled 2016-03-17: qty 15

## 2016-03-17 MED ORDER — SODIUM CHLORIDE 0.9 % IV BOLUS (SEPSIS): 1500.0000 mL | Freq: Once | INTRAVENOUS | Status: DC

## 2016-03-17 NOTE — Assessment & Plan Note (Signed)
On Glucotrol Invokana - stopped, Metformin - stopped

## 2016-03-17 NOTE — ED Triage Notes (Signed)
Onset 2-3 days ago NP cough.

## 2016-03-17 NOTE — Progress Notes (Signed)
Pharmacy Antibiotic Note  Michael [name] is a 75 y.o. male with a PMH of DM, HTN, CAD, psoriasis admitted on 03/17/2016 with c/o rash. Additionally pt complains of SOB, weakness, and cough x2-3 days.  Pharmacy has been consulted for levofloxacin dosing for CAP. Pt is afebrile, HR 80, WBC 10.8 upon admission, down to 9.0, Lactic acid 1.9. CrCl = 44.5 ml/min, Scr 1.95 upon admission.    Plan: Levofloxacin 750 mg IV q48 hrs Monitor Tmax, WBC, clinical picture, renal function F/u BCxr, ABX LOT   Height: 6' (182.9 cm) Weight: 257 lb (116.6 kg) IBW/kg (Calculated) : 77.6  Temp (24hrs), Avg:102.6 F (39.2 C), Min:102.6 F (39.2 C), Max:102.6 F (39.2 C)   Recent Labs Lab 03/17/16 1034 03/17/16 1634 03/17/16 1658  WBC 10.8* 9.0  --   CREATININE 1.88* 1.95*  --   LATICACIDVEN  --   --  1.90    Estimated Creatinine Clearance: 44.5 mL/min (by C-G formula based on SCr of 1.95 mg/dL).    Allergies  Allergen Reactions  . Percocet [Oxycodone-Acetaminophen] Nausea And Vomiting  . Invokana [Canagliflozin]     Side effects  . Metformin And Related     Upset stomach    Antimicrobials this admission:  8/22 levofloxacin >>   Dose adjustments this admission: none  Microbiology results: 8/22 BCx: sent  Thank you for allowing pharmacy to be a part of this patient's care.  Carlean Jews, Pharm.D. PGY1 Pharmacy Resident 8/22/20178:16 PM Pager [phone]

## 2016-03-17 NOTE — Assessment & Plan Note (Addendum)
  F/u w/Dr Denna Haggard - check re: Humira

## 2016-03-17 NOTE — Assessment & Plan Note (Signed)
On amlodipine, cardizem, Eliquis 

## 2016-03-17 NOTE — ED Provider Notes (Signed)
[facility] DEPT Provider Note   CSN: [id number] Arrival date & time: 03/17/16  1621     History   Chief Complaint Chief Complaint  Patient presents with  . Chest Pain  . Cough  . Weakness    HPI RAYQUON USELMAN is a 75 y.o. male hx of HTN, HL, afib on eliquis, DM, Cough, fever, weakness. Patient states that he has been weak for the last several days and has been having some productive cough. He also feels progressively weaker and has a hard time getting out of bed today. Denies any urinary symptoms or abdominal pain or vomiting. Patient went to primary care office today and had labs that showed acute renal failure. Still not feeling better so came for evaluation. No recent admissions. Denies sick contacts.   The history is provided by the patient.    Past Medical History:  Diagnosis Date  . Allergy   . Arthritis   . AVM (arteriovenous malformation) of colon   . CAD (coronary artery disease)    a. Cath 10/2014 - Mild to moderate diagonal, circumflex and obtuse marginal disease. Innominate artery sternosis.  . Carotid artery disease ([place])    a. Carotid dopple 03/2014 25-36% RICA &  64-40% LICA stenosis b. 3/47  . CKD (chronic kidney disease), stage III   . Dysrhythmia    ATRIAL FIBRILATION  . Elevated troponin    a. 09/2014 in setting of AF RVR-->Myoview: EF 49%, no ischemia/infarct->Med Rx.  . GERD (gastroesophageal reflux disease)   . Gout   . H/O transfusion of whole blood   . History of PFTs    PFTs 6/16:  FVC 3.73 (87%), FEV1 2.93 (88%), FEV1/FVC 78%, DLCO 69%  . Hyperlipidemia   . Hypertension   . Hypothyroidism   . Iron deficiency anemia   . PAF (paroxysmal atrial fibrillation) ([place])    a. 09/2014: Converted on Dilt;  b. CHA2DS2VASc = 3-->eliquis;  c. 09/2014 Echo: EF 55-60%, mild LVH, mildly dil LA.  [name] Personal history of colonic polyps 2007, 2008   adenoma each time, largest 12 mm in 2007  . Psoriasis   . Type II diabetes mellitus ([place])    TYPE 2    Patient  Active Problem List   Diagnosis Date Noted  . Pustular rash 03/17/2016  . Hypothyroidism 03/17/2016  . Acute renal failure superimposed on stage 3 chronic kidney disease ([place]) 03/17/2016  . Noncompliance with diet and medication regimen 12/16/2015  . LBP (low back pain) 03/08/2015  . Frequency-urgency syndrome 03/08/2015  . Subclavian artery stenosis, right 01/16/2015  . Cholelithiasis 01/16/2015  . Restless leg syndrome 01/10/2015  . Postherpetic neuralgia 12/18/2014  . Herpes zoster 11/28/2014  . Leg pain, left 11/28/2014  . Demand ischemia ([place])   . Paroxysmal a-fib ([place]) 11/13/2014  . PAF (paroxysmal atrial fibrillation) ([place])   . Hyperlipidemia   . Hypertension   . Elevated troponin   . Poorly controlled diabetes mellitus ([place]) 10/11/2014  . Pain in joint, shoulder region 07/10/2014  . Psoriasis 03/19/2014  . Gangrenous appendicitis 02/10/2014  . RLQ abdominal pain 02/09/2014  . Diarrhea 02/09/2014  . Appendicitis 02/09/2014  . Unspecified vitamin D deficiency 11/13/2013  . Carotid stenosis 11/06/2013  . Headache(784.0) 11/06/2013  . Psoriatic arthritis ([place]) 11/06/2013  . Hypertriglyceridemia 05/16/2013  . HTN (hypertension), benign 09/10/2012  . Obesity 09/10/2012  . Gout 09/10/2012  . Angiodysplasia of intestinal tract 01/20/2012  . Iron deficiency anemia due to chronic blood loss 01/20/2012  . DM (diabetes  mellitus) ([place]) 12/22/2011  . Thyroid disease 12/22/2011    Past Surgical History:  Procedure Laterality Date  . APPENDECTOMY  02/09/2014  . COLONOSCOPY  02/10/2011   internal hemorrhoids  . COLONOSCOPY W/ POLYPECTOMY  04/09/2006   12 mm adenoma  . COLONOSCOPY W/ POLYPECTOMY  06/17/2007   5 mm adenoma  . ESOPHAGOGASTRODUODENOSCOPY  12/23/2011   Procedure: ESOPHAGOGASTRODUODENOSCOPY (EGD);  Surgeon: Irene Shipper, MD;  Location: [facility];  Service: Endoscopy;  Laterality: N/A;  with small bowel bx's  . GIVENS CAPSULE STUDY  12/28/2011  . KNEE ARTHROSCOPY      right  . LAPAROSCOPIC APPENDECTOMY N/A 02/09/2014   Procedure: APPENDECTOMY LAPAROSCOPIC;  Surgeon: Leighton Ruff, MD;  Location: [facility];  Service: General;  Laterality: N/A;  . LEFT HEART CATHETERIZATION WITH CORONARY ANGIOGRAM N/A 11/15/2014   Procedure: LEFT HEART CATHETERIZATION WITH CORONARY ANGIOGRAM;  Surgeon: Belva Crome, MD;  Location: [facility] CATH LAB;  Service: Cardiovascular;  Laterality: N/A;  . ROTATOR CUFF REPAIR     left       Home Medications    Prior to Admission medications   Medication Sig Start Date End Date Taking? Authorizing Provider  acetaminophen (TYLENOL) 650 MG CR tablet Take 1,300 mg by mouth every 8 (eight) hours as needed for pain.     Historical Provider, MD  allopurinol (ZYLOPRIM) 100 MG tablet Take 2 tablets (200 mg total) by mouth daily. 02/03/16   Evie Lacks [name], MD  amiodarone (PACERONE) 200 MG tablet Take 1 tablet (200 mg total) by mouth daily. 06/25/15   Wellington Hampshire, MD  apixaban (ELIQUIS) 5 MG TABS tablet Take 1 tablet (5 mg total) by mouth 2 (two) times daily. 11/07/15   Wellington Hampshire, MD  atorvastatin (LIPITOR) 40 MG tablet Take 1 tablet (40 mg total) by mouth daily. 03/09/16   Josue Hector, MD  Blood Glucose Monitoring Suppl (ONE TOUCH ULTRA 2) W/DEVICE KIT Use as directed Dx E11.9 05/18/14   Cassandria Anger, MD  Blood Pressure Monitor KIT Use to check blood pressure daily Dx I10 10/04/15   Cassandria Anger, MD  Cholecalciferol 1000 UNITS TBDP Take 1,000 Units by mouth daily.    Historical Provider, MD  clobetasol (TEMOVATE) 0.05 % external solution Apply 1 application topically 2 (two) times daily. Reported on 12/16/2015    Historical Provider, MD  diltiazem (CARDIZEM CD) 180 MG 24 hr capsule Take 1 capsule (180 mg total) by mouth daily. 12/16/15   Wellington Hampshire, MD  doxycycline (VIBRA-TABS) 100 MG tablet Take 1 tablet (100 mg total) by mouth 2 (two) times daily. 03/17/16   Cassandria Anger, MD  ergocalciferol (VITAMIN D2)  50000 UNITS capsule Take 1 capsule (50,000 Units total) by mouth once a week. 11/08/13   Cassandria Anger, MD  fenofibrate 160 MG tablet TAKE 1 TABLET(160 MG) BY MOUTH DAILY 08/09/15   Darlin Coco, MD  ferrous sulfate 325 (65 FE) MG tablet Take 325 mg by mouth 3 (three) times daily with meals.    Historical Provider, MD  furosemide (LASIX) 20 MG tablet Take 1 tablet (20 mg total) by mouth daily. 12/16/15   Evie Lacks [name], MD  glipiZIDE (GLUCOTROL) 10 MG tablet TAKE ONE TABLET BY MOUTH ONCE DAILY IN THE MORNING. 01/21/16   Evie Lacks [name], MD  glucose blood (ONE TOUCH ULTRA TEST) test strip Use to check blood sugar daily. Dx: E11.9 01/27/16   Evie Lacks [name], MD  HUMIRA PEN 40 MG/0.8ML PNKT Inject  40 mg into the skin every 14 (fourteen) days. 06/18/15   Historical Provider, MD  HYDROcodone-acetaminophen (NORCO/VICODIN) 5-325 MG tablet Take 1 tablet by mouth 2 (two) times daily as needed for moderate pain or severe pain. 12/16/15   Evie Lacks [name], MD  Insulin Glargine (TOUJEO SOLOSTAR) 300 UNIT/ML SOPN Inject 11 Units into the skin every morning. Titrate up by 1 unit every 1-2 days for goal sugars of 120-140 03/17/16   Evie Lacks [name], MD  Insulin Pen Needle 31G X 5 MM MISC 1 Units by Does not apply route 4 (four) times daily. 03/17/16   Cassandria Anger, MD  Lancets (ONETOUCH ULTRASOFT) lancets Use to check blood sugars daily 05/18/14   Cassandria Anger, MD  levothyroxine (SYNTHROID, LEVOTHROID) 150 MCG tablet TAKE 1 TABLET BY MOUTH DAILY BEFORE BREAKFAST 03/16/16   Evie Lacks [name], MD  lisinopril (PRINIVIL,ZESTRIL) 20 MG tablet TAKE 1 TABLET BY MOUTH EVERY MORNING 11/21/15   Cassandria Anger, MD  metoprolol (LOPRESSOR) 50 MG tablet TAKE 1 TABLET(50 MG) BY MOUTH TWICE DAILY 11/15/15   Wellington Hampshire, MD  omeprazole (PRILOSEC) 40 MG capsule TAKE 1 CAPSULE BY MOUTH EVERY DAY 11/05/15   Cassandria Anger, MD  predniSONE (DELTASONE) 5 MG tablet Take 5 mg by mouth daily as  needed (hand swelling). Reported on 12/16/2015    Historical Provider, MD  triamcinolone cream (KENALOG) 0.1 % Apply 1 application topically 2 (two) times daily. 11/28/14   Evie Lacks [name], MD  valACYclovir (VALTREX) 1000 MG tablet Take 1 g by mouth daily. 11/28/14   Historical Provider, MD    Family History Family History  Problem Relation Age of Onset  . Heart attack Father   . Lung cancer Father   . Diabetes Father   . Stroke Father   . Hypertension Father   . Stroke Mother   . Hypertension Mother   . Cancer Brother     liver  . Heart disease Brother     chf  . Malignant hyperthermia Neg Hx     Social History Social History  Substance Use Topics  . Smoking status: Former Smoker    Packs/day: 0.70    Years: 48.00    Types: Cigarettes    Quit date: 03/29/2006  . Smokeless tobacco: Never Used  . Alcohol use 0.6 oz/week    1 Cans of beer per week     Comment: occasional alcohol intake     Allergies   Percocet [oxycodone-acetaminophen]; Invokana [canagliflozin]; and Metformin and related   Review of Systems Review of Systems  Respiratory: Positive for cough.   Cardiovascular: Positive for chest pain.  Neurological: Positive for weakness.  All other systems reviewed and are negative.    Physical Exam Updated Vital Signs BP (!) 153/43   Pulse 83   Temp 102.6 F (39.2 C) (Oral)   Resp 20   Ht 6' (1.829 m)   Wt 257 lb (116.6 kg)   SpO2 96%   BMI 34.86 kg/m   Physical Exam  Constitutional: He is oriented to person, place, and time.  Tired   HENT:  Head: Normocephalic.  Eyes: EOM are normal. Pupils are equal, round, and reactive to light.  Neck: Normal range of motion.  Cardiovascular: Normal rate, regular rhythm and normal heart sounds.   Pulmonary/Chest: Effort normal.  Crackles L side   Abdominal: Soft. Bowel sounds are normal.  Musculoskeletal: Normal range of motion.  Neurological: He is alert and oriented to person, place, and time.  Skin: Skin  is warm.  Psychiatric: He has a normal mood and affect. His behavior is normal.  Nursing note and vitals reviewed.    ED Treatments / Results  Labs (all labs ordered are listed, but only abnormal results are displayed) Labs Reviewed  BASIC METABOLIC PANEL - Abnormal; Notable for the following:       Result Value   Sodium 131 (*)    Chloride 98 (*)    Glucose, Bld 280 (*)    BUN 32 (*)    Creatinine, Ser 1.95 (*)    Calcium 8.7 (*)    GFR calc non Af Amer 32 (*)    GFR calc Af Amer 38 (*)    All other components within normal limits  CBC - Abnormal; Notable for the following:    RBC 3.62 (*)    Hemoglobin 10.8 (*)    HCT 32.3 (*)    RDW 15.6 (*)    All other components within normal limits  CULTURE, BLOOD (ROUTINE X 2)  CULTURE, BLOOD (ROUTINE X 2)  I-STAT TROPOININ, ED  I-STAT CG4 LACTIC ACID, ED  I-STAT CG4 LACTIC ACID, ED    EKG  EKG Interpretation None       Radiology Dg Chest 2 View  Result Date: 03/17/2016 CLINICAL DATA:  Left chest pain EXAM: CHEST  2 VIEW COMPARISON:  01/20/2015 FINDINGS: Left upper lobe opacity, suspicious for pneumonia. No pleural effusion or pneumothorax. The heart is normal in size. Visualized osseous structures are within normal limits. IMPRESSION: Left upper lobe opacity, suspicious for pneumonia. Follow-up chest radiographs are suggested in 4-6 weeks to document resolution. Electronically Signed   By: [name] M.D.   On: 03/17/2016 17:46    Procedures Procedures (including critical care time)  Medications Ordered in ED Medications  levofloxacin (LEVAQUIN) IVPB 750 mg (750 mg Intravenous New Bag/Given 03/17/16 2014)  acetaminophen (TYLENOL) tablet 650 mg (650 mg Oral Given 03/17/16 1644)  sodium chloride 0.9 % bolus 1,000 mL (1,000 mLs Intravenous New Bag/Given 03/17/16 2014)  metoCLOPramide (REGLAN) injection 10 mg (10 mg Intravenous Given 03/17/16 2029)  diphenhydrAMINE (BENADRYL) injection 25 mg (25 mg Intravenous Given  03/17/16 2029)     Initial Impression / Assessment and Plan / ED Course  I have reviewed the triage vital signs and the nursing notes.  Pertinent labs & imaging results that were available during my care of the patient were reviewed by me and considered in my medical decision making (see chart for details).  Clinical Course   ANURAG SCARFO is a 75 y.o. male here with weakness. Febrile in triage, not hypotensive or tachycardic. Appears dehydrated. Will do sepsis workup and give IVF.  8:46 PM Labs showed acute renal failure with Cr 2. Lactate nl. Na 131 likely from dehydration. CXR showed L upper lobe pneumonia. Given levaquin. Will admit.     Final Clinical Impressions(s) / ED Diagnoses   Final diagnoses:  None    New Prescriptions New Prescriptions   No medications on file     Drenda Freeze, MD 03/17/16 2047

## 2016-03-17 NOTE — ED Notes (Signed)
Report attempted 

## 2016-03-17 NOTE — Progress Notes (Signed)
Subjective:  Patient ID: Michael [name], male    DOB: [birth year]  Age: 75 y.o. MRN: [id number]  CC: No chief complaint on file.   HPI Michael [name] presents for DM, HTN, CAD, psoriasis w/arthropathy f/u. C/o rash - pimples and boils on arms and legs CBGs 300-400 at home  Outpatient Medications Prior to Visit  Medication Sig Dispense Refill  . acetaminophen (TYLENOL) 650 MG CR tablet Take 1,300 mg by mouth every 8 (eight) hours as needed for pain.     [name] allopurinol (ZYLOPRIM) 100 MG tablet Take 2 tablets (200 mg total) by mouth daily. 60 tablet 11  . amiodarone (PACERONE) 200 MG tablet Take 1 tablet (200 mg total) by mouth daily. 30 tablet 11  . apixaban (ELIQUIS) 5 MG TABS tablet Take 1 tablet (5 mg total) by mouth 2 (two) times daily. 60 tablet 5  . atorvastatin (LIPITOR) 40 MG tablet Take 1 tablet (40 mg total) by mouth daily. 90 tablet 2  . Blood Glucose Monitoring Suppl (ONE TOUCH ULTRA 2) W/DEVICE KIT Use as directed Dx E11.9 1 each 0  . Blood Pressure Monitor KIT Use to check blood pressure daily Dx I10 1 each 0  . Cholecalciferol 1000 UNITS TBDP Take 1,000 Units by mouth daily.    . clobetasol (TEMOVATE) 0.05 % external solution Apply 1 application topically 2 (two) times daily. Reported on 12/16/2015    . diltiazem (CARDIZEM CD) 180 MG 24 hr capsule Take 1 capsule (180 mg total) by mouth daily. 30 capsule 10  . ergocalciferol (VITAMIN D2) 50000 UNITS capsule Take 1 capsule (50,000 Units total) by mouth once a week. 6 capsule 0  . fenofibrate 160 MG tablet TAKE 1 TABLET(160 MG) BY MOUTH DAILY 30 tablet 11  . ferrous sulfate 325 (65 FE) MG tablet Take 325 mg by mouth 3 (three) times daily with meals.    . furosemide (LASIX) 20 MG tablet Take 1 tablet (20 mg total) by mouth daily. 90 tablet 3  . glipiZIDE (GLUCOTROL) 10 MG tablet TAKE ONE TABLET BY MOUTH ONCE DAILY IN THE MORNING. 30 tablet 11  . glucose blood (ONE TOUCH ULTRA TEST) test strip Use to check blood sugar daily. Dx:  E11.9 100 each 3  . HUMIRA PEN 40 MG/0.8ML PNKT Inject 40 mg into the skin every 14 (fourteen) days.    [name] HYDROcodone-acetaminophen (NORCO/VICODIN) 5-325 MG tablet Take 1 tablet by mouth 2 (two) times daily as needed for moderate pain or severe pain. 100 tablet 0  . Lancets (ONETOUCH ULTRASOFT) lancets Use to check blood sugars daily 30 each 11  . levothyroxine (SYNTHROID, LEVOTHROID) 150 MCG tablet TAKE 1 TABLET BY MOUTH DAILY BEFORE BREAKFAST 90 tablet 3  . lisinopril (PRINIVIL,ZESTRIL) 20 MG tablet TAKE 1 TABLET BY MOUTH EVERY MORNING 90 tablet 3  . metFORMIN (GLUCOPHAGE) 500 MG tablet Take 1 tablet (500 mg total) by mouth daily with breakfast. 30 tablet 11  . metoprolol (LOPRESSOR) 50 MG tablet TAKE 1 TABLET(50 MG) BY MOUTH TWICE DAILY 180 tablet 1  . omeprazole (PRILOSEC) 40 MG capsule TAKE 1 CAPSULE BY MOUTH EVERY DAY 90 capsule 1  . predniSONE (DELTASONE) 5 MG tablet Take 5 mg by mouth daily as needed (hand swelling). Reported on 12/16/2015    . triamcinolone cream (KENALOG) 0.1 % Apply 1 application topically 2 (two) times daily. 80 g 3  . valACYclovir (VALTREX) 1000 MG tablet Take 1 g by mouth daily.  0   No facility-administered medications prior to  visit.     ROS Review of Systems  Constitutional: Negative for appetite change, fatigue and unexpected weight change.  HENT: Negative for congestion, nosebleeds, sneezing, sore throat and trouble swallowing.   Eyes: Negative for itching and visual disturbance.  Respiratory: Negative for cough.   Cardiovascular: Negative for chest pain, palpitations and leg swelling.  Gastrointestinal: Negative for abdominal distention, blood in stool, diarrhea and nausea.  Genitourinary: Negative for frequency and hematuria.  Musculoskeletal: Positive for arthralgias, back pain, gait problem and joint swelling. Negative for neck pain.  Skin: Positive for rash.  Neurological: Negative for dizziness, tremors, speech difficulty and weakness.    Psychiatric/Behavioral: Negative for agitation, dysphoric mood and sleep disturbance. The patient is not nervous/anxious.     Objective:  BP 140/60   Pulse 86   Wt 257 lb (116.6 kg)   SpO2 97%   BMI 35.84 kg/m   BP Readings from Last 3 Encounters:  03/17/16 140/60  12/20/15 (!) 160/66  12/16/15 (!) 174/70    Wt Readings from Last 3 Encounters:  03/17/16 257 lb (116.6 kg)  12/20/15 263 lb 12.8 oz (119.7 kg)  12/16/15 262 lb 12 oz (119.2 kg)    Physical Exam  Constitutional: He is oriented to person, place, and time. He appears well-developed. No distress.  NAD  HENT:  Mouth/Throat: Oropharynx is clear and moist.  Eyes: Conjunctivae are normal. Pupils are equal, round, and reactive to light.  Neck: Normal range of motion. No JVD present. No thyromegaly present.  Cardiovascular: Normal rate, regular rhythm, normal heart sounds and intact distal pulses.  Exam reveals no gallop and no friction rub.   No murmur heard. Pulmonary/Chest: Effort normal and breath sounds normal. No respiratory distress. He has no wheezes. He has no rales. He exhibits no tenderness.  Abdominal: Soft. Bowel sounds are normal. He exhibits no distension and no mass. There is no tenderness. There is no rebound and no guarding.  Musculoskeletal: Normal range of motion. He exhibits tenderness. He exhibits no edema.  Lymphadenopathy:    He has no cervical adenopathy.  Neurological: He is alert and oriented to person, place, and time. He has normal reflexes. No cranial nerve deficit. He exhibits normal muscle tone. He displays a negative Romberg sign. Coordination abnormal. Gait normal.  Skin: Skin is warm and dry. Rash noted.  Psychiatric: He has a normal mood and affect. His behavior is normal. Judgment and thought content normal.  small pustules and ulcers on arms and legs One boil on R thigh   Lab Results  Component Value Date   WBC 5.5 01/20/2015   HGB 12.1 (L) 01/20/2015   HCT 34.7 (L) 01/20/2015    PLT 171 01/20/2015   GLUCOSE 398 (H) 12/16/2015   CHOL 100 03/20/2015   TRIG (H) 03/20/2015    795.0 Triglyceride is over 400; calculations on Lipids are invalid.   HDL 22.30 (L) 03/20/2015   LDLDIRECT 26.0 03/20/2015   LDLCALC UNABLE TO CALCULATE IF TRIGLYCERIDE OVER 400 mg/dL 10/12/2014   ALT 15 03/20/2015   AST 18 03/20/2015   NA 132 (L) 12/16/2015   K 4.5 12/16/2015   CL 99 12/16/2015   CREATININE 1.39 12/16/2015   BUN 16 12/16/2015   CO2 27 12/16/2015   TSH 1.89 09/17/2015   PSA 0.50 11/07/2013   INR 1.17 01/20/2015   HGBA1C 8.4 (H) 12/16/2015   MICROALBUR 1.2 05/16/2013    No results found.  Assessment & Plan:   There are no diagnoses linked to this  encounter. I am having Mr. Noreen maintain his acetaminophen, predniSONE, ferrous sulfate, ergocalciferol, ONE TOUCH ULTRA 2, onetouch ultrasoft, Cholecalciferol, clobetasol, triamcinolone cream, valACYclovir, HUMIRA PEN, amiodarone, fenofibrate, Blood Pressure Monitor, omeprazole, apixaban, metoprolol, lisinopril, furosemide, HYDROcodone-acetaminophen, diltiazem, metFORMIN, glipiZIDE, glucose blood, allopurinol, atorvastatin, and levothyroxine.  No orders of the defined types were placed in this encounter.    Follow-up: No Follow-up on file.  [name], MD

## 2016-03-17 NOTE — ED Notes (Signed)
Pt states he is not allergic to Acetaminophen.

## 2016-03-17 NOTE — Progress Notes (Signed)
Attempted report to ED nurse.  

## 2016-03-17 NOTE — ED Triage Notes (Signed)
Pt was seen at PCP office this morning, no EKG done.    Pt c/o left pain underneath breast x several days thought it was a pulled muscle.  Onset today pt has c/o being weak, shortness of breath.

## 2016-03-17 NOTE — Assessment & Plan Note (Signed)
Dr Denna Haggard - on [facility] 2017

## 2016-03-17 NOTE — H&P (Addendum)
History and Physical    Michael [name]:[id number] DOB: February 26, 1941 DOA: 03/17/2016  Referring MD/NP/PA:   PCP: [name], MD   Patient coming from:  The patient is coming from home.  At baseline, pt is independent for most of ADL.     Chief Complaint: Chest pain, shortness of breath, cough, fever, chills, rash  HPI: Michael [name] is a 75 y.o. male with medical history significant of psoriasis on Humira, hypertension, hyperlipidemia, diabetes mellitus, GERD, hypothyroidism, gout, PAF on Eliquis, CAd, AVM of colon, CKD-III, who presents with shortness, cough, fever, chills, rash.   Patient states that he has been having chest pain, dry cough, SOB, fever, chills in the past 3 days. His chest pain is located in the left side of chest, constant, moderate, radiating to the back, sharp, pleuritic. It is aggravated by coughing and deep breath. No tenderness over calf areas. Patient denies nausea, vomiting, diarrhea, abdominal pain, symptoms of UTI or unilateral weakness. Pt notice some scattered pimple rashes in legs and arms recently.  ED Course: pt was found to have  WBC 10.8, temperature 102.6, no tachycardia, has tachypnea, oxygen saturation 93% on room air, lactate 1.90, troponin negative, was seen in renal function, sodium 131. Chest x-ray showed infiltration left upper lobe. Will admit to elemetry bed as inpt.  Review of Systems:   General: has fevers, chills, no changes in body weight, has poor appetite, has fatigue HEENT: no blurry vision, hearing changes or sore throat Respiratory: has dyspnea, coughing, no wheezing CV: has chest pain, no palpitations GI: no nausea, vomiting, abdominal pain, diarrhea, constipation GU: no dysuria, burning on urination, increased urinary frequency, hematuria  Ext: no leg edema Neuro: no unilateral weakness, numbness, or tingling, no vision change or hearing loss Skin: has rash in legs and arms.  MSK: No muscle spasm, no deformity, no limitation  of range of movement in spin Heme: No easy bruising.  Travel history: No recent long distant travel.  Allergy:  Allergies  Allergen Reactions  . Percocet [Oxycodone-Acetaminophen] Nausea And Vomiting  . Invokana [Canagliflozin]     Side effects  . Metformin And Related     Upset stomach    Past Medical History:  Diagnosis Date  . Allergy   . Arthritis   . AVM (arteriovenous malformation) of colon   . CAD (coronary artery disease)    a. Cath 10/2014 - Mild to moderate diagonal, circumflex and obtuse marginal disease. Innominate artery sternosis.  . Carotid artery disease ([place])    a. Carotid dopple 03/2014 31-54% RICA &  00-86% LICA stenosis b. 7/61  . CKD (chronic kidney disease), stage III   . Dysrhythmia    ATRIAL FIBRILATION  . Elevated troponin    a. 09/2014 in setting of AF RVR-->Myoview: EF 49%, no ischemia/infarct->Med Rx.  . GERD (gastroesophageal reflux disease)   . Gout   . H/O transfusion of whole blood   . History of PFTs    PFTs 6/16:  FVC 3.73 (87%), FEV1 2.93 (88%), FEV1/FVC 78%, DLCO 69%  . Hyperlipidemia   . Hypertension   . Hypothyroidism   . Iron deficiency anemia   . PAF (paroxysmal atrial fibrillation) ([place])    a. 09/2014: Converted on Dilt;  b. CHA2DS2VASc = 3-->eliquis;  c. 09/2014 Echo: EF 55-60%, mild LVH, mildly dil LA.  [name] Personal history of colonic polyps 2007, 2008   adenoma each time, largest 12 mm in 2007  . Psoriasis   . Type II diabetes mellitus ([place])  TYPE 2    Past Surgical History:  Procedure Laterality Date  . APPENDECTOMY  02/09/2014  . COLONOSCOPY  02/10/2011   internal hemorrhoids  . COLONOSCOPY W/ POLYPECTOMY  04/09/2006   12 mm adenoma  . COLONOSCOPY W/ POLYPECTOMY  06/17/2007   5 mm adenoma  . ESOPHAGOGASTRODUODENOSCOPY  12/23/2011   Procedure: ESOPHAGOGASTRODUODENOSCOPY (EGD);  Surgeon: Irene Shipper, MD;  Location: [facility];  Service: Endoscopy;  Laterality: N/A;  with small bowel bx's  . GIVENS CAPSULE STUDY  12/28/2011    . KNEE ARTHROSCOPY     right  . LAPAROSCOPIC APPENDECTOMY N/A 02/09/2014   Procedure: APPENDECTOMY LAPAROSCOPIC;  Surgeon: Leighton Ruff, MD;  Location: [facility];  Service: General;  Laterality: N/A;  . LEFT HEART CATHETERIZATION WITH CORONARY ANGIOGRAM N/A 11/15/2014   Procedure: LEFT HEART CATHETERIZATION WITH CORONARY ANGIOGRAM;  Surgeon: Belva Crome, MD;  Location: [facility] CATH LAB;  Service: Cardiovascular;  Laterality: N/A;  . ROTATOR CUFF REPAIR     left    Social History:  reports that he quit smoking about 9 years ago. His smoking use included Cigarettes. He has a 33.60 pack-year smoking history. He has never used smokeless tobacco. He reports that he drinks about 0.6 oz of alcohol per week . He reports that he does not use drugs.  Family History:  Family History  Problem Relation Age of Onset  . Heart attack Father   . Lung cancer Father   . Diabetes Father   . Stroke Father   . Hypertension Father   . Stroke Mother   . Hypertension Mother   . Cancer Brother     liver  . Heart disease Brother     chf  . Malignant hyperthermia Neg Hx      Prior to Admission medications   Medication Sig Start Date End Date Taking? Authorizing Provider  acetaminophen (TYLENOL) 650 MG CR tablet Take 1,300 mg by mouth every 8 (eight) hours as needed for pain.     Historical Provider, MD  allopurinol (ZYLOPRIM) 100 MG tablet Take 2 tablets (200 mg total) by mouth daily. 02/03/16   Evie Lacks [name], MD  amiodarone (PACERONE) 200 MG tablet Take 1 tablet (200 mg total) by mouth daily. 06/25/15   Wellington Hampshire, MD  apixaban (ELIQUIS) 5 MG TABS tablet Take 1 tablet (5 mg total) by mouth 2 (two) times daily. 11/07/15   Wellington Hampshire, MD  atorvastatin (LIPITOR) 40 MG tablet Take 1 tablet (40 mg total) by mouth daily. 03/09/16   Josue Hector, MD  Blood Glucose Monitoring Suppl (ONE TOUCH ULTRA 2) W/DEVICE KIT Use as directed Dx E11.9 05/18/14   Cassandria Anger, MD  Blood Pressure Monitor KIT  Use to check blood pressure daily Dx I10 10/04/15   Cassandria Anger, MD  Cholecalciferol 1000 UNITS TBDP Take 1,000 Units by mouth daily.    Historical Provider, MD  clobetasol (TEMOVATE) 0.05 % external solution Apply 1 application topically 2 (two) times daily. Reported on 12/16/2015    Historical Provider, MD  diltiazem (CARDIZEM CD) 180 MG 24 hr capsule Take 1 capsule (180 mg total) by mouth daily. 12/16/15   Wellington Hampshire, MD  doxycycline (VIBRA-TABS) 100 MG tablet Take 1 tablet (100 mg total) by mouth 2 (two) times daily. 03/17/16   Cassandria Anger, MD  ergocalciferol (VITAMIN D2) 50000 UNITS capsule Take 1 capsule (50,000 Units total) by mouth once a week. 11/08/13   Cassandria Anger, MD  fenofibrate  160 MG tablet TAKE 1 TABLET(160 MG) BY MOUTH DAILY 08/09/15   Darlin Coco, MD  ferrous sulfate 325 (65 FE) MG tablet Take 325 mg by mouth 3 (three) times daily with meals.    Historical Provider, MD  furosemide (LASIX) 20 MG tablet Take 1 tablet (20 mg total) by mouth daily. 12/16/15   Evie Lacks [name], MD  glipiZIDE (GLUCOTROL) 10 MG tablet TAKE ONE TABLET BY MOUTH ONCE DAILY IN THE MORNING. 01/21/16   Evie Lacks [name], MD  glucose blood (ONE TOUCH ULTRA TEST) test strip Use to check blood sugar daily. Dx: E11.9 01/27/16   Evie Lacks [name], MD  HUMIRA PEN 40 MG/0.8ML PNKT Inject 40 mg into the skin every 14 (fourteen) days. 06/18/15   Historical Provider, MD  HYDROcodone-acetaminophen (NORCO/VICODIN) 5-325 MG tablet Take 1 tablet by mouth 2 (two) times daily as needed for moderate pain or severe pain. 12/16/15   Evie Lacks [name], MD  Insulin Glargine (TOUJEO SOLOSTAR) 300 UNIT/ML SOPN Inject 11 Units into the skin every morning. Titrate up by 1 unit every 1-2 days for goal sugars of 120-140 03/17/16   Evie Lacks [name], MD  Insulin Pen Needle 31G X 5 MM MISC 1 Units by Does not apply route 4 (four) times daily. 03/17/16   Cassandria Anger, MD  Lancets (ONETOUCH ULTRASOFT)  lancets Use to check blood sugars daily 05/18/14   Cassandria Anger, MD  levothyroxine (SYNTHROID, LEVOTHROID) 150 MCG tablet TAKE 1 TABLET BY MOUTH DAILY BEFORE BREAKFAST 03/16/16   Evie Lacks [name], MD  lisinopril (PRINIVIL,ZESTRIL) 20 MG tablet TAKE 1 TABLET BY MOUTH EVERY MORNING 11/21/15   Cassandria Anger, MD  metoprolol (LOPRESSOR) 50 MG tablet TAKE 1 TABLET(50 MG) BY MOUTH TWICE DAILY 11/15/15   Wellington Hampshire, MD  omeprazole (PRILOSEC) 40 MG capsule TAKE 1 CAPSULE BY MOUTH EVERY DAY 11/05/15   Cassandria Anger, MD  predniSONE (DELTASONE) 5 MG tablet Take 5 mg by mouth daily as needed (hand swelling). Reported on 12/16/2015    Historical Provider, MD  triamcinolone cream (KENALOG) 0.1 % Apply 1 application topically 2 (two) times daily. 11/28/14   Evie Lacks [name], MD  valACYclovir (VALTREX) 1000 MG tablet Take 1 g by mouth daily. 11/28/14   Historical Provider, MD    Physical Exam: Vitals:   03/17/16 1637 03/17/16 1844 03/17/16 2004 03/17/16 2005  BP:  (!) 146/52 (!) 153/43   Pulse:  80  83  Resp:  _0 Temp:      TempSrc:      SpO2:  97%  96%  Weight: 116.6 kg (257 lb)     Height: 6' (1.829 m)      General: Not in acute distress HEENT:       Eyes: PERRL, EOMI, no scleral icterus.       ENT: No discharge from the ears and nose, no pharynx injection, no tonsillar enlargement.        Neck: No JVD, no bruit, no mass felt. Heme: No neck lymph node enlargement. Cardiac: S1/S2, RRR, No murmurs, No gallops or rubs. Respiratory:  Has coarse breathing sound. No rales, wheezing, rhonchi or rubs. GI: Soft, nondistended, nontender, no rebound pain, no organomegaly, BS present. GU: No hematuria Ext: No pitting leg edema bilaterally. 2+DP/PT pulse bilaterally. Musculoskeletal: No joint deformities, No joint redness or warmth, no limitation of ROM in spin. Skin: has rash in legs and arms. Some rashes are dome-shaped papules  Neuro: Alert, oriented X3, cranial  nerves II-XII  grossly intact, moves all extremities normally.  Psych: Patient is not psychotic, no suicidal or hemocidal ideation.  Labs on Admission: I have personally reviewed following labs and imaging studies  CBC:  Recent Labs Lab 03/17/16 1034 03/17/16 1634  WBC 10.8* 9.0  NEUTROABS 9.4*  --   HGB 10.8* 10.8*  HCT 30.9* 32.3*  MCV 87.3 89.2  PLT 180.0 154   Basic Metabolic Panel:  Recent Labs Lab 03/17/16 1034 03/17/16 1634  NA 128* 131*  K 5.0 4.5  CL 95* 98*  CO2 25 24  GLUCOSE 340* 280*  BUN 32* 32*  CREATININE 1.88* 1.95*  CALCIUM 8.6 8.7*   GFR: Estimated Creatinine Clearance: 44.5 mL/min (by C-G formula based on SCr of 1.95 mg/dL). Liver Function Tests: No results for input(s): AST, ALT, ALKPHOS, BILITOT, PROT, ALBUMIN in the last 168 hours. No results for input(s): LIPASE, AMYLASE in the last 168 hours. No results for input(s): AMMONIA in the last 168 hours. Coagulation Profile: No results for input(s): INR, PROTIME in the last 168 hours. Cardiac Enzymes: No results for input(s): CKTOTAL, CKMB, CKMBINDEX, TROPONINI in the last 168 hours. BNP (last 3 results) No results for input(s): PROBNP in the last 8760 hours. HbA1C:  Recent Labs  03/17/16 1034  HGBA1C 8.9*   CBG: No results for input(s): GLUCAP in the last 168 hours. Lipid Profile: No results for input(s): CHOL, HDL, LDLCALC, TRIG, CHOLHDL, LDLDIRECT in the last 72 hours. Thyroid Function Tests: No results for input(s): TSH, T4TOTAL, FREET4, T3FREE, THYROIDAB in the last 72 hours. Anemia Panel:  Recent Labs  03/17/16 1034  IRON 21*   Urine analysis:    Component Value Date/Time   COLORURINE AMBER (A) 02/09/2014 1805   APPEARANCEUR CLOUDY (A) 02/09/2014 1805   LABSPEC 1.022 02/09/2014 1805   PHURINE 5.0 02/09/2014 1805   GLUCOSEU NEGATIVE 02/09/2014 1805   GLUCOSEU NEGATIVE 11/07/2013 0754   HGBUR NEGATIVE 02/09/2014 1805   BILIRUBINUR neg 03/08/2015 1105   KETONESUR NEGATIVE 02/09/2014  1805   PROTEINUR NEGATIVE 02/09/2014 1805   UROBILINOGEN negative 03/08/2015 1105   UROBILINOGEN 0.2 02/09/2014 1805   NITRITE negative 03/08/2015 1105   NITRITE NEGATIVE 02/09/2014 1805   LEUKOCYTESUR Negative 03/08/2015 1105   Sepsis Labs: _0 (procalcitonin:4,lacticidven:4) )No results found for this or any previous visit (from the past 240 hour(s)).   Radiological Exams on Admission: Dg Chest 2 View  Result Date: 03/17/2016 CLINICAL DATA:  Left chest pain EXAM: CHEST  2 VIEW COMPARISON:  01/20/2015 FINDINGS: Left upper lobe opacity, suspicious for pneumonia. No pleural effusion or pneumothorax. The heart is normal in size. Visualized osseous structures are within normal limits. IMPRESSION: Left upper lobe opacity, suspicious for pneumonia. Follow-up chest radiographs are suggested in 4-6 weeks to document resolution. Electronically Signed   By: [name] M.D.   On: 03/17/2016 17:46     EKG:  Not done in ED, will get one.   Assessment/Plan Principal Problem:   CAP (community acquired pneumonia) Active Problems:   DM (diabetes mellitus) ([place])   Iron deficiency anemia due to chronic blood loss   HTN (hypertension), benign   Gout   Hypertriglyceridemia   Psoriasis   PAF (paroxysmal atrial fibrillation) ([facility])   Hyperlipidemia   Hypothyroidism   Acute renal failure superimposed on stage 3 chronic kidney disease ([facility])   Sepsis ([facility])   Rash   CAP and sepsis: Patient's cough, shortness of breath, pleuritic chest pain, leukocytosis, plus chest x-ray findings of infiltration left upper  lobe, consistent with CAP. Pt meets  criterior for sepsis with fever, tachypnea and leukocytosis. Lactate is normal. Hemodynamically stable. Pt was given one dose of Levaquin in ED, will broaden the coverage due to immunosuppressed state.  - Will admit to telemetry bed as inpt - IV Vancomycin, Zosyn and doxycyline - prn Mucinex for cough and prn Norco for CP - prn Albuterol Nebs for  SOB - Urine legionella and S. pneumococcal antigen - Follow up blood culture x2, sputum culture and respiratory virus panel - will get Procalcitonin and trend lactic acid level per sepsis protocol - IVF: 2.5L of NS bolus in ED, followed by 100 mL per hour of NS  PAF: CHA2DS2-VASc Score is 3, needs oral anticoagulation. Patient is on Eliquis at home. Heart rate is well controlled. -continue Eliquis -continue metoprolol, Cardizem  and amiodarone  DM-II: Last A1c 8.9 on 03/17/16, poorly controled. Patient is taking glargine insulin, glipizide at home. Blood sugar 280 on admission -will decrease glargine insulin dose from 11 to 8 units daily -SSI  HTN: -Hold lisinopril and Lasix -Continue metoprolol, Cardizem -IV hydralazine.  Gout: -continue home allopurinol   HLD: Last LDL was 26 on 03/20/15 -Continue home medications: Fenofibrate and Lipitor  Psoriasis: Stable. Patient is on Humira every 2 weeks, last dose was on Thursday. -Continue Humira, not due for next dose yet -also on Valtrex (likely for PPx).  Hypothyroidism: Last TSH was 1.89 on 09/17/15 -Continue home Synthroid  Acute renal failure superimposed on stage 3 chronic kidney disease ([place]): Baseline Cre is 1.3-1.6. His cre is 1.95 is on admission. Likely due to prerenal secondary to dehydration and continuation of ACEI, diruetics. - IVF as above - Check FeUrea - Follow up renal function by BMP - Hold lasix and lisinopril  GERD: -Protonix  Rash: Unclear etiology. Some rashes are dome-shaped papules--> Molluscum contagiosum? Dose not seem to be due to insect bite. -observe. -On doxycycline as above.   DVT ppx: On Eliquis Code Status: Full code Family Communication: None at bed side.   Disposition Plan:  Anticipate discharge back to previous home environment Consults called:  none Admission status: Obs / tele      Date of Service 03/17/2016    Ivor Costa [facility] Pager [phone]  If 7PM-7AM, please  contact night-coverage [email or web address] Password [facility] 03/17/2016, 9:35 PM

## 2016-03-17 NOTE — Assessment & Plan Note (Signed)
Summer 2017 pimples and boils on arms and legs ?staph infection Doxy x 10 d F/u w/Dr Denna Haggard - check re: Humira

## 2016-03-17 NOTE — Progress Notes (Signed)
Pre visit review using our clinic review tool, if applicable. No additional management support is needed unless otherwise documented below in the visit note. 

## 2016-03-18 ENCOUNTER — Inpatient Hospital Stay (HOSPITAL_COMMUNITY): Payer: Medicare Other

## 2016-03-18 DIAGNOSIS — J189 Pneumonia, unspecified organism: Secondary | ICD-10-CM

## 2016-03-18 LAB — CBC
HCT: 26.8 % — ABNORMAL LOW (ref 39.0–52.0)
HEMOGLOBIN: 8.9 g/dL — AB (ref 13.0–17.0)
MCH: 29.4 pg (ref 26.0–34.0)
MCHC: 33.2 g/dL (ref 30.0–36.0)
MCV: 88.4 fL (ref 78.0–100.0)
Platelets: 124 10*3/uL — ABNORMAL LOW (ref 150–400)
RBC: 3.03 MIL/uL — AB (ref 4.22–5.81)
RDW: 15.8 % — ABNORMAL HIGH (ref 11.5–15.5)
WBC: 7.5 10*3/uL (ref 4.0–10.5)

## 2016-03-18 LAB — BASIC METABOLIC PANEL
ANION GAP: 7 (ref 5–15)
BUN: 33 mg/dL — ABNORMAL HIGH (ref 6–20)
CHLORIDE: 101 mmol/L (ref 101–111)
CO2: 22 mmol/L (ref 22–32)
Calcium: 7.9 mg/dL — ABNORMAL LOW (ref 8.9–10.3)
Creatinine, Ser: 2.04 mg/dL — ABNORMAL HIGH (ref 0.61–1.24)
GFR calc non Af Amer: 31 mL/min — ABNORMAL LOW (ref >60)
GFR, EST AFRICAN AMERICAN: 36 mL/min — AB (ref >60)
Glucose, Bld: 260 mg/dL — ABNORMAL HIGH (ref 65–99)
POTASSIUM: 3.9 mmol/L (ref 3.5–5.1)
SODIUM: 130 mmol/L — AB (ref 135–145)

## 2016-03-18 LAB — GLUCOSE, CAPILLARY
GLUCOSE-CAPILLARY: 254 mg/dL — AB (ref 65–99)
GLUCOSE-CAPILLARY: 260 mg/dL — AB (ref 65–99)
GLUCOSE-CAPILLARY: 228 mg/dL — AB (ref 65–99)
Glucose-Capillary: 258 mg/dL — ABNORMAL HIGH (ref 65–99)

## 2016-03-18 LAB — STREP PNEUMONIAE URINARY ANTIGEN: STREP PNEUMO URINARY ANTIGEN: NEGATIVE

## 2016-03-18 LAB — LACTIC ACID, PLASMA: Lactic Acid, Venous: 1.4 mmol/L (ref 0.5–1.9)

## 2016-03-18 LAB — CREATININE, URINE, RANDOM: Creatinine, Urine: 149.32 mg/dL

## 2016-03-18 MED ORDER — SODIUM CHLORIDE 0.9 % IV BOLUS (SEPSIS): 1000.0000 mL | Freq: Once | INTRAVENOUS | Status: DC

## 2016-03-18 MED ORDER — SODIUM CHLORIDE 0.9 % IV SOLN
INTRAVENOUS | Status: DC
Start: 2016-03-18 — End: 2016-03-19
  Administered 2016-03-19: 01:00:00 via INTRAVENOUS

## 2016-03-18 NOTE — Progress Notes (Signed)
Inpatient Diabetes Program Recommendations  AACE/ADA: New Consensus Statement on Inpatient Glycemic Control (2015)  Target Ranges:  Prepandial:   less than 140 mg/dL      Peak postprandial:   less than 180 mg/dL (1-2 hours)      Critically ill patients:  140 - 180 mg/dL   Lab Results  Component Value Date   GLUCAP 260 (H) 03/18/2016   HGBA1C 8.9 (H) 03/17/2016    Review of Glycemic Control:  Results for Michael [name], Michael [name] (MRN [id number]) as of 03/18/2016 13:49  Ref. Range 03/17/2016 22:34 03/18/2016 08:08 03/18/2016 11:31  Glucose-Capillary Latest Ref Range: 65 - 99 mg/dL 239 (H) 254 (H) 260 (H)   Diabetes history: Type 2 diabetes Outpatient Diabetes medications: Glipizide 10 mg daily, Toujeo 11 units q AM Current orders for Inpatient glycemic control: Novolog sensitive tid with meals, Lantus 8 units q AM Inpatient Diabetes Program Recommendations:   May consider increasing Lantus to 12 units q AM.  Thanks, Adah Perl, RN, BC-ADM Inpatient Diabetes Coordinator Pager [phone] (8a-5p)

## 2016-03-18 NOTE — Progress Notes (Signed)
PROGRESS NOTE                                                                                                                                                                                                             Patient Demographics:    Michael [name], is a 75 y.o. male, DOB - [birth year], [id number]  Admit date - 03/17/2016   Admitting Physician Ivor Costa, MD  Outpatient Primary MD for the patient is [name], MD  LOS - 1  Chief Complaint  Patient presents with  . Chest Pain  . Cough  . Weakness       Brief Narrative     Michael [name] is a 75 y.o. male with medical history significant of psoriasis on Humira, hypertension, hyperlipidemia, diabetes mellitus, GERD, hypothyroidism, gout, PAF on Eliquis, CAd, AVM of colon, CKD-III, who presents with shortness, cough, fever, chills, rash.   Patient states that he has been having chest pain, dry cough, SOB, fever, chills in the past 3 days. His chest pain is located in the left side of chest, constant, moderate, radiating to the back, sharp, pleuritic. It is aggravated by coughing and deep breath. No tenderness over calf areas. Patient denies nausea, vomiting, diarrhea, abdominal pain, symptoms of UTI or unilateral weakness. Pt notice some scattered pimple rashes in legs and arms recently.  ED Course: pt was found to have  WBC 10.8, temperature 102.6, no tachycardia, has tachypnea, oxygen saturation 93% on room air, lactate 1.90, troponin negative, was seen in renal function, sodium 131. Chest x-ray showed infiltration left upper lobe. Will admit to elemetry bed as inpt.    Subjective:    Michael [name] today has, No headache, No chest pain, No abdominal pain - No Nausea, No new weakness tingling or numbness, mild Cough - no SOB.     Assessment  & Plan :     1. CAP with Sepsis - Patient is immunocompromised as he is on Humira, seems to have responded well to  empiric IV antibiotics which are Vancomycin, Zosyn and Doxy, clinically improved, sepsis physiology has resolved after IV fluids, lactate is normal, will continue to hydrate and monitor cultures. Supportive care with oxygen and nebulizer treatments as needed.  2. Paroxysmal atrial fibrillation [place] vasc 2 score of 3. Continue Eliquis, beta blocker, Cardizem and amiodarone combination. Monitor blood  pressure.  3. Dyslipidemia. Continue statin.  4. Psoriasis. On Humira outpatient.  5. Hypothyroidism. Continue Synthroid.  6. Essential hypertension. Continue combination of beta blocker and Cardizem.  7. GERD. On PPI.  8. ARF on CKD 3. Baseline creatinine close to 1.5. Worse due to #1 above, hydrate, check renal ultrasound monitor BMP.  9. Gout. On allopurinol.   10. Hyponatremia due to dehydration from sepsis. Hydrate and monitor.  11.DM type II. On Lantus and sliding scale.  Lab Results  Component Value Date   HGBA1C 8.9 (H) 03/17/2016   CBG (last 3)   Recent Labs  03/17/16 2234 03/18/16 0808  GLUCAP 239* 254*    Family Communication  :  None present  Code Status :  Full  Diet : Heart Healthy  Disposition Plan  :  Stay inpt  Consults  :  None  Procedures  :    DVT Prophylaxis  :  Eliquis  Lab Results  Component Value Date   PLT 150 03/17/2016    Inpatient Medications  Scheduled Meds: . sodium chloride   Intravenous STAT  . allopurinol  200 mg Oral Daily  . amiodarone  200 mg Oral Daily  . apixaban  5 mg Oral BID  . atorvastatin  40 mg Oral q1800  . cholecalciferol  1,000 Units Oral Daily  . clobetasol cream   Topical BID  . diltiazem  180 mg Oral Daily  . doxycycline  100 mg Oral Q12H  . fenofibrate  160 mg Oral Daily  . ferrous sulfate  325 mg Oral TID WC  . insulin aspart  0-9 Units Subcutaneous TID WC  . insulin glargine  8 Units Subcutaneous q morning - 10a  . levothyroxine  150 mcg Oral QAC breakfast  . metoprolol  50 mg Oral BID  .  pantoprazole  40 mg Oral Daily  . piperacillin-tazobactam  3.375 g Intravenous Once  . piperacillin-tazobactam (ZOSYN)  IV  3.375 g Intravenous Q8H  . sodium chloride  1,000 mL Intravenous Once  . sodium chloride  1,500 mL Intravenous Once  . triamcinolone cream  1 application Topical BID  . valACYclovir  1,000 mg Oral Daily  . [START ON 03/19/2016] vancomycin  1,250 mg Intravenous Q24H   Continuous Infusions: . sodium chloride     PRN Meds:.albuterol, dextromethorphan-guaiFENesin, hydrALAZINE, HYDROcodone-acetaminophen  Antibiotics  :    Anti-infectives    Start     Dose/Rate Route Frequency Ordered Stop   03/19/16 0600  vancomycin (VANCOCIN) 1,250 mg in sodium chloride 0.9 % 250 mL IVPB     1,250 mg 166.7 mL/hr over 90 Minutes Intravenous Every 24 hours 03/17/16 2058     03/18/16 2014  levofloxacin (LEVAQUIN) IVPB 750 mg  Status:  Discontinued     750 mg 100 mL/hr over 90 Minutes Intravenous Every 24 hours 03/17/16 2050 03/17/16 2107   03/18/16 1000  valACYclovir (VALTREX) tablet 1,000 mg     1,000 mg Oral Daily 03/17/16 2050     03/18/16 1000  doxycycline (VIBRA-TABS) tablet 100 mg     100 mg Oral Every 12 hours 03/17/16 2107     03/18/16 0400  piperacillin-tazobactam (ZOSYN) IVPB 3.375 g     3.375 g 12.5 mL/hr over 240 Minutes Intravenous Every 8 hours 03/17/16 2058     03/17/16 2130  piperacillin-tazobactam (ZOSYN) IVPB 3.375 g     3.375 g 100 mL/hr over 30 Minutes Intravenous  Once 03/17/16 2058     03/17/16 2130  vancomycin (VANCOCIN) 2,000 mg  in sodium chloride 0.9 % 500 mL IVPB     2,000 mg 250 mL/hr over 120 Minutes Intravenous  Once 03/17/16 2058 03/18/16 0656   03/17/16 2015  levofloxacin (LEVAQUIN) IVPB 750 mg  Status:  Discontinued     750 mg 100 mL/hr over 90 Minutes Intravenous Every 48 hours 03/17/16 2006 03/17/16 2050         Objective:   Vitals:   03/17/16 2145 03/17/16 2217 03/18/16 0538 03/18/16 0812  BP: 162/61 (!) 159/61 (!) 144/56 (!) 134/58    Pulse: 86 93 78 71  Resp: 20 20 18 17   Temp:  99.3 F (37.4 C) (!) 102.6 F (39.2 C) 98.2 F (36.8 C)  TempSrc:  Oral Oral Oral  SpO2: 98% 95% 96% 96%  Weight:  116.2 kg (256 lb 1.6 oz)    Height:        Wt Readings from Last 3 Encounters:  03/17/16 116.2 kg (256 lb 1.6 oz)  03/17/16 116.6 kg (257 lb)  12/20/15 119.7 kg (263 lb 12.8 oz)     Intake/Output Summary (Last 24 hours) at 03/18/16 0916 Last data filed at 03/18/16 [id number]  Gross per 24 hour  Intake              120 ml  Output              200 ml  Net              -80 ml     Physical Exam  Awake Alert, Oriented X 3, No new F.N deficits, Normal affect Michael [name],PERRAL Supple Neck,No JVD, No cervical lymphadenopathy appriciated.  Symmetrical Chest wall movement, Good air movement bilaterally, few LLL rales RRR,No Gallops,Rubs or new Murmurs, No Parasternal Heave +ve B.Sounds, Abd Soft, No tenderness, No organomegaly appriciated, No rebound - guarding or rigidity. 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 will monitor     Data Review:    CBC  Recent Labs Lab 03/17/16 1034 03/17/16 1634  WBC 10.8* 9.0  HGB 10.8* 10.8*  HCT 30.9* 32.3*  PLT 180.0 150  MCV 87.3 89.2  MCH  --  29.8  MCHC 34.8 33.4  RDW 16.2* 15.6*  LYMPHSABS 0.6*  --   MONOABS 0.8  --   EOSABS 0.0  --   BASOSABS 0.0  --     Chemistries   Recent Labs Lab 03/17/16 1034 03/17/16 1634  NA 128* 131*  K 5.0 4.5  CL 95* 98*  CO2 25 24  GLUCOSE 340* 280*  BUN 32* 32*  CREATININE 1.88* 1.95*  CALCIUM 8.6 8.7*   ------------------------------------------------------------------------------------------------------------------ No results for input(s): CHOL, HDL, LDLCALC, TRIG, CHOLHDL, LDLDIRECT in the last 72 hours.  Lab Results  Component Value Date   HGBA1C 8.9 (H) 03/17/2016    ------------------------------------------------------------------------------------------------------------------ No results for input(s): TSH, T4TOTAL, T3FREE, THYROIDAB in the last 72 hours.  Invalid input(s): FREET3 ------------------------------------------------------------------------------------------------------------------  Recent Labs  03/17/16 1034  IRON 21*    Coagulation profile  Recent Labs Lab 03/17/16 2111  INR 1.62    No results for input(s): DDIMER in the last 72 hours.  Cardiac Enzymes No results for input(s): CKMB, TROPONINI, MYOGLOBIN in the last 168 hours.  Invalid input(s): CK ------------------------------------------------------------------------------------------------------------------    Component Value Date/Time   BNP 502.4 (H) 11/13/2014 1513    Micro Results No results found for this or any previous visit (from the past 240 hour(s)).  Radiology  Reports Dg Chest 2 View  Result Date: 03/17/2016 CLINICAL DATA:  Left chest pain EXAM: CHEST  2 VIEW COMPARISON:  01/20/2015 FINDINGS: Left upper lobe opacity, suspicious for pneumonia. No pleural effusion or pneumothorax. The heart is normal in size. Visualized osseous structures are within normal limits. IMPRESSION: Left upper lobe opacity, suspicious for pneumonia. Follow-up chest radiographs are suggested in 4-6 weeks to document resolution. Electronically Signed   By: [name] M.D.   On: 03/17/2016 17:46    Time Spent in minutes  30   Brison Fiumara K M.D on 03/18/2016 at 9:16 AM  Between 7am to 7pm - Pager - [phone]  After 7pm go to [email or web address] - password [facility]  [facility] -  Office  [phone]

## 2016-03-18 NOTE — Evaluation (Signed)
Physical Therapy Evaluation Patient Details Name: Michael [name] MRN: [id number] DOB: 12-02-1940 Today's Date: 03/18/2016   History of Present Illness  Michael Shiflet Jonesis a 75 y.o.malewith medical history significant of psoriasis on Humira, hypertension, hyperlipidemia, diabetes mellitus, GERD, hypothyroidism, gout, PAF on Eliquis, CAd, AVM of colon, CKD-III,who presents with shortness, cough, fever, chills, rash. CAP with Sepsis  Clinical Impression  Pt admitted with above diagnosis. Pt currently with functional limitations due to the deficits listed below (see PT Problem List).  Pt will benefit from skilled PT to increase their independence and safety with mobility to allow discharge to the venue listed below.       Follow Up Recommendations No PT follow up;Other (comment) (Anticipate speedy progress and no PT followup needs)    Equipment Recommendations  Other (comment);None recommended by PT (perhaps consider cane)    Recommendations for Other Services       Precautions / Restrictions Precautions Precaution Comments: Fall risk is present, but minimal      Mobility  Bed Mobility Overal bed mobility: Needs Assistance Bed Mobility: Supine to Sit     Supine to sit: Supervision     General bed mobility comments: Cues to initiate and to use rails   Transfers Overall transfer level: Needs assistance Equipment used: 1 person hand held assist Transfers: Sit to/from Stand Sit to Stand: Min assist;Mod assist         General transfer comment: Min assist to steady; light mod assist after initial stand due t small loss of balance  Ambulation/Gait Ambulation/Gait assistance: Min assist Ambulation Distance (Feet): 150 Feet Assistive device: None (and pushing IV pole) Gait Pattern/deviations: Decreased step length - right;Decreased step length - left;Wide base of support;Decreased stride length     General Gait Details: Cues to self-monitor for activity tolerance; noting  initially a few small losses of balance which eased off as he walked further; UE support on IV pole helped as well  Stairs            Wheelchair Mobility    Modified Rankin (Stroke Patients Only)       Balance Overall balance assessment: Needs assistance Sitting-balance support: No upper extremity supported Sitting balance-Leahy Scale: Good       Standing balance-Leahy Scale: Fair                               Pertinent Vitals/Pain Pain Assessment: Faces Faces Pain Scale: Hurts little more Pain Location: Reports pain in chest and ribs, related to tightness with breathing and coughing Pain Descriptors / Indicators: Discomfort;Tightness Pain Intervention(s): Monitored during session    Home Living Family/patient expects to be discharged to:: Private residence Living Arrangements: Spouse/significant other Available Help at Discharge: Family;Available PRN/intermittently Type of Home: House Home Access: Stairs to enter Entrance Stairs-Rails: Right;Left;Can reach both Entrance Stairs-Number of Steps: 8 Home Layout: One level Home Equipment: None      Prior Function Level of Independence: Independent               Hand Dominance        Extremity/Trunk Assessment   Upper Extremity Assessment: Generalized weakness           Lower Extremity Assessment: Generalized weakness         Communication   Communication: No difficulties  Cognition Arousal/Alertness: Awake/alert Behavior During Therapy: WFL for tasks assessed/performed Overall Cognitive Status: Within Functional Limits for tasks assessed  General Comments General comments (skin integrity, edema, etc.): Entire session conducted on Room Air; O2 sats remained greater than or equal to 93%     Exercises        Assessment/Plan    PT Assessment Patient needs continued PT services  PT Diagnosis Difficulty walking;Generalized weakness   PT Problem  List Decreased strength;Decreased activity tolerance;Decreased balance;Decreased mobility;Decreased coordination;Decreased knowledge of use of DME  PT Treatment Interventions DME instruction;Gait training;Stair training;Functional mobility training;Therapeutic activities;Therapeutic exercise;Patient/family education   PT Goals (Current goals can be found in the Care Plan section) Acute Rehab PT Goals Patient Stated Goal: Hopes to be home soon PT Goal Formulation: With patient Time For Goal Achievement: 03/25/16 Potential to Achieve Goals: Good    Frequency Min 3X/week   Barriers to discharge        Co-evaluation               End of Session Equipment Utilized During Treatment: Gait belt Activity Tolerance: Patient tolerated treatment well Patient left: in bed;with call bell/phone within reach Nurse Communication: Mobility status         Time: 1339-1410 (end time is approximate) PT Time Calculation (min) (ACUTE ONLY): 31 min   Charges:   PT Evaluation $PT Eval Moderate Complexity: 1 Procedure PT Treatments $Gait Training: 8-22 mins   PT G CodesQuin [name] 03/18/2016, 2:56 PM  Roney Marion, [place] Pager [phone] Office [phone]

## 2016-03-19 LAB — BASIC METABOLIC PANEL
ANION GAP: 8 (ref 5–15)
BUN: 29 mg/dL — ABNORMAL HIGH (ref 6–20)
CO2: 22 mmol/L (ref 22–32)
Calcium: 7.7 mg/dL — ABNORMAL LOW (ref 8.9–10.3)
Chloride: 102 mmol/L (ref 101–111)
Creatinine, Ser: 1.85 mg/dL — ABNORMAL HIGH (ref 0.61–1.24)
GFR, EST AFRICAN AMERICAN: 40 mL/min — AB (ref >60)
GFR, EST NON AFRICAN AMERICAN: 34 mL/min — AB (ref >60)
GLUCOSE: 217 mg/dL — AB (ref 65–99)
POTASSIUM: 4.4 mmol/L (ref 3.5–5.1)
Sodium: 132 mmol/L — ABNORMAL LOW (ref 135–145)

## 2016-03-19 LAB — LEGIONELLA PNEUMOPHILA SEROGP 1 UR AG: L. PNEUMOPHILA SEROGP 1 UR AG: NEGATIVE

## 2016-03-19 LAB — GLUCOSE, CAPILLARY
GLUCOSE-CAPILLARY: 213 mg/dL — AB (ref 65–99)
GLUCOSE-CAPILLARY: 288 mg/dL — AB (ref 65–99)
GLUCOSE-CAPILLARY: 233 mg/dL — AB (ref 65–99)

## 2016-03-19 LAB — C DIFFICILE QUICK SCREEN W PCR REFLEX
C DIFFICLE (CDIFF) ANTIGEN: POSITIVE — AB
C Diff toxin: NEGATIVE

## 2016-03-19 LAB — UREA NITROGEN, URINE: UREA NITROGEN UR: 1225 mg/dL

## 2016-03-19 LAB — CLOSTRIDIUM DIFFICILE BY PCR: CDIFFPCR: POSITIVE — AB

## 2016-03-19 MED ORDER — INSULIN ASPART 100 UNIT/ML ~~LOC~~ SOLN
4.0000 [IU] | Freq: Three times a day (TID) | SUBCUTANEOUS | Status: DC
  Administered 2016-03-19 – 2016-03-21 (×6): 4 [IU] via SUBCUTANEOUS

## 2016-03-19 MED ORDER — SODIUM CHLORIDE 0.9 % IV SOLN
INTRAVENOUS | Status: DC
Start: 2016-03-19 — End: 2016-03-20
  Administered 2016-03-19 (×2): via INTRAVENOUS

## 2016-03-19 NOTE — Progress Notes (Signed)
PROGRESS NOTE                                                                                                                                                                                                             Patient Demographics:    Michael [name], is a 75 y.o. male, DOB - [birth year], [id number]  Admit date - 03/17/2016   Admitting Physician Ivor Costa, MD  Outpatient Primary MD for the patient is [name], MD  LOS - 2  Chief Complaint  Patient presents with  . Chest Pain  . Cough  . Weakness       Brief Narrative     Michael [name] is a 75 y.o. male with medical history significant of psoriasis on Humira, hypertension, hyperlipidemia, diabetes mellitus, GERD, hypothyroidism, gout, PAF on Eliquis, CAd, AVM of colon, CKD-III, who presents with shortness, cough, fever, chills, rash.   Patient states that he has been having chest pain, dry cough, SOB, fever, chills in the past 3 days. His chest pain is located in the left side of chest, constant, moderate, radiating to the back, sharp, pleuritic. It is aggravated by coughing and deep breath. No tenderness over calf areas. Patient denies nausea, vomiting, diarrhea, abdominal pain, symptoms of UTI or unilateral weakness. Pt notice some scattered pimple rashes in legs and arms recently.  ED Course: pt was found to have  WBC 10.8, temperature 102.6, no tachycardia, has tachypnea, oxygen saturation 93% on room air, lactate 1.90, troponin negative, was seen in renal function, sodium 131. Chest x-ray showed infiltration left upper lobe. Will admit to elemetry bed as inpt.    Subjective:    Michael [name] today has, No headache, No chest pain, No abdominal pain - No Nausea, No new weakness tingling or numbness, mild Cough - no SOB.     Assessment  & Plan :     1. CAP with Sepsis - Patient is immunocompromised as he is on Humira, seems to have responded well to  empiric IV antibiotics which are Vancomycin, Zosyn and Doxy, clinically improved, sepsis physiology has resolved after IV fluids, lactate is normal, will continue to hydrate and monitor cultures. Supportive care with oxygen and nebulizer treatments as needed.  2. Paroxysmal atrial fibrillation [place] vasc 2 score of 3. Continue Eliquis, beta blocker, Cardizem and amiodarone combination. Monitor blood  pressure.  3. Dyslipidemia. Continue statin.  4. Psoriasis. On Humira outpatient.  5. Hypothyroidism. Continue Synthroid.  6. Essential hypertension. Continue combination of beta blocker and Cardizem.  7. GERD. On PPI.  8. ARF on CKD 3. Baseline creatinine close to 1.5. Worse due to #1 above, hydrate, stable renal ultrasound, monitor BMP.  9. Gout. On allopurinol.   10. Hyponatremia due to dehydration from sepsis. Hydrate and monitor.  11.DM type II. On Lantus and sliding scale add premeal Novolog.  Lab Results  Component Value Date   HGBA1C 8.9 (H) 03/17/2016   CBG (last 3)   Recent Labs  03/18/16 1645 03/18/16 2034 03/19/16 0813  GLUCAP 228* 258* 213*    Family Communication  :  None present  Code Status :  Full  Diet : Heart Healthy  Disposition Plan  :  Stay inpt  Consults  :  None  Procedures  :    DVT Prophylaxis  :  Eliquis  Lab Results  Component Value Date   PLT 124 (L) 03/18/2016    Inpatient Medications  Scheduled Meds: . allopurinol  200 mg Oral Daily  . amiodarone  200 mg Oral Daily  . apixaban  5 mg Oral BID  . atorvastatin  40 mg Oral q1800  . cholecalciferol  1,000 Units Oral Daily  . diltiazem  180 mg Oral Daily  . doxycycline  100 mg Oral Q12H  . fenofibrate  160 mg Oral Daily  . ferrous sulfate  325 mg Oral TID WC  . insulin aspart  0-9 Units Subcutaneous TID WC  . insulin glargine  8 Units Subcutaneous q morning - 10a  . levothyroxine  150 mcg Oral QAC breakfast  . metoprolol  50 mg Oral BID  . pantoprazole  40 mg Oral Daily  .  piperacillin-tazobactam  3.375 g Intravenous Once  . piperacillin-tazobactam (ZOSYN)  IV  3.375 g Intravenous Q8H  . sodium chloride  1,500 mL Intravenous Once  . valACYclovir  1,000 mg Oral Daily  . vancomycin  1,250 mg Intravenous Q24H   Continuous Infusions: . sodium chloride 120 mL/hr at 03/19/16 0122   PRN Meds:.albuterol, dextromethorphan-guaiFENesin, hydrALAZINE, HYDROcodone-acetaminophen  Antibiotics  :    Anti-infectives    Start     Dose/Rate Route Frequency Ordered Stop   03/19/16 0600  vancomycin (VANCOCIN) 1,250 mg in sodium chloride 0.9 % 250 mL IVPB     1,250 mg 166.7 mL/hr over 90 Minutes Intravenous Every 24 hours 03/17/16 2058     03/18/16 2014  levofloxacin (LEVAQUIN) IVPB 750 mg  Status:  Discontinued     750 mg 100 mL/hr over 90 Minutes Intravenous Every 24 hours 03/17/16 2050 03/17/16 2107   03/18/16 1000  valACYclovir (VALTREX) tablet 1,000 mg     1,000 mg Oral Daily 03/17/16 2050     03/18/16 1000  doxycycline (VIBRA-TABS) tablet 100 mg     100 mg Oral Every 12 hours 03/17/16 2107     03/18/16 0400  piperacillin-tazobactam (ZOSYN) IVPB 3.375 g     3.375 g 12.5 mL/hr over 240 Minutes Intravenous Every 8 hours 03/17/16 2058     03/17/16 2130  piperacillin-tazobactam (ZOSYN) IVPB 3.375 g     3.375 g 100 mL/hr over 30 Minutes Intravenous  Once 03/17/16 2058     03/17/16 2130  vancomycin (VANCOCIN) 2,000 mg in sodium chloride 0.9 % 500 mL IVPB     2,000 mg 250 mL/hr over 120 Minutes Intravenous  Once 03/17/16 2058 03/18/16 [id number]  03/17/16 2015  levofloxacin (LEVAQUIN) IVPB 750 mg  Status:  Discontinued     750 mg 100 mL/hr over 90 Minutes Intravenous Every 48 hours 03/17/16 2006 03/17/16 2050         Objective:   Vitals:   03/18/16 0812 03/18/16 1650 03/18/16 2107 03/19/16 0421  BP: (!) 134/58 (!) 128/55 (!) 148/54 (!) 142/70  Pulse: 71 79 80 77  Resp: 17 16 18 19   Temp: 98.2 F (36.8 C) 98.9 F (37.2 C) 99.5 F (37.5 C) 98.7 F (37.1 C)    TempSrc: Oral Oral Oral Oral  SpO2: 96% 92% 93% 95%  Weight:      Height:        Wt Readings from Last 3 Encounters:  03/17/16 116.2 kg (256 lb 1.6 oz)  03/17/16 116.6 kg (257 lb)  12/20/15 119.7 kg (263 lb 12.8 oz)     Intake/Output Summary (Last 24 hours) at 03/19/16 0842 Last data filed at 03/19/16 0603  Gross per 24 hour  Intake             4246 ml  Output             2602 ml  Net             1644 ml     Physical Exam  Awake Alert, Oriented X 3, No new F.N deficits, Normal affect [place].AT,PERRAL Supple Neck,No JVD, No cervical lymphadenopathy appriciated.  Symmetrical Chest wall movement, Good air movement bilaterally, few LLL rales RRR,No Gallops,Rubs or new Murmurs, No Parasternal Heave +ve B.Sounds, Abd Soft, No tenderness, No organomegaly appriciated, No rebound - guarding or rigidity. No Cyanosis, Clubbing or edema, No new Rash or bruise, has chronic macular rash in his lower extremities will monitor     Data Review:    CBC  Recent Labs Lab 03/17/16 1034 03/17/16 1634 03/18/16 0915  WBC 10.8* 9.0 7.5  HGB 10.8* 10.8* 8.9*  HCT 30.9* 32.3* 26.8*  PLT 180.0 150 124*  MCV 87.3 89.2 88.4  MCH  --  29.8 29.4  MCHC 34.8 33.4 33.2  RDW 16.2* 15.6* 15.8*  LYMPHSABS 0.6*  --   --   MONOABS 0.8  --   --   EOSABS 0.0  --   --   BASOSABS 0.0  --   --     Chemistries   Recent Labs Lab 03/17/16 1034 03/17/16 1634 03/18/16 0915  NA 128* 131* 130*  K 5.0 4.5 3.9  CL 95* 98* 101  CO2 25 24 22   GLUCOSE 340* 280* 260*  BUN 32* 32* 33*  CREATININE 1.88* 1.95* 2.04*  CALCIUM 8.6 8.7* 7.9*   ------------------------------------------------------------------------------------------------------------------ No results for input(s): CHOL, HDL, LDLCALC, TRIG, CHOLHDL, LDLDIRECT in the last 72 hours.  Lab Results  Component Value Date   HGBA1C 8.9 (H) 03/17/2016    ------------------------------------------------------------------------------------------------------------------ No results for input(s): TSH, T4TOTAL, T3FREE, THYROIDAB in the last 72 hours.  Invalid input(s): FREET3 ------------------------------------------------------------------------------------------------------------------  Recent Labs  03/17/16 1034  IRON 21*    Coagulation profile  Recent Labs Lab 03/17/16 2111  INR 1.62    No results for input(s): DDIMER in the last 72 hours.  Cardiac Enzymes No results for input(s): CKMB, TROPONINI, MYOGLOBIN in the last 168 hours.  Invalid input(s): CK ------------------------------------------------------------------------------------------------------------------    Component Value Date/Time   BNP 502.4 (H) 11/13/2014 1513    Micro Results Recent Results (from the past 240 hour(s))  Blood culture (routine x 2)     Status:  None (Preliminary result)   Collection Time: 03/17/16  8:13 PM  Result Value Ref Range Status   Specimen Description BLOOD RIGHT ANTECUBITAL  Final   Special Requests BOTTLES DRAWN AEROBIC AND ANAEROBIC 5CC  Final   Culture NO GROWTH < 24 HOURS  Final   Report Status PENDING  Incomplete  Blood culture (routine x 2)     Status: None (Preliminary result)   Collection Time: 03/17/16  8:19 PM  Result Value Ref Range Status   Specimen Description BLOOD LEFT ANTECUBITAL  Final   Special Requests BOTTLES DRAWN AEROBIC AND ANAEROBIC 5CC  Final   Culture NO GROWTH < 24 HOURS  Final   Report Status PENDING  Incomplete    Radiology Reports Dg Chest 2 View  Result Date: 03/17/2016 CLINICAL DATA:  Left chest pain EXAM: CHEST  2 VIEW COMPARISON:  01/20/2015 FINDINGS: Left upper lobe opacity, suspicious for pneumonia. No pleural effusion or pneumothorax. The heart is normal in size. Visualized osseous structures are within normal limits. IMPRESSION: Left upper lobe opacity, suspicious for pneumonia.  Follow-up chest radiographs are suggested in 4-6 weeks to document resolution. Electronically Signed   By: [name] M.D.   On: 03/17/2016 17:46   US Renal  Result Date: 03/18/2016 CLINICAL DATA:  Acute renal failure. EXAM: RENAL / URINARY TRACT ULTRASOUND COMPLETE COMPARISON:  Abdominal CT 02/09/2014 FINDINGS: Right Kidney: Length: 11.9 cm. Echogenicity within normal limits. No mass or hydronephrosis visualized. Left Kidney: Length: 12.5 cm. Small cyst in the interpolar region measures 10 mm maximally. No suspicious cortical findings. No evidence of hydronephrosis. Bladder: Appears normal for degree of bladder distention. Cholelithiasis and increased hepatic echogenicity most consistent with steatosis are noted incidentally. IMPRESSION: 1. Negative renal ultrasound.  No hydronephrosis. 2. Cholelithiasis and hepatic steatosis noted. Electronically Signed   By: Richardean Sale M.D.   On: 03/18/2016 10:27    Time Spent in minutes  30   SINGH,PRASHANT K M.D on 03/19/2016 at 8:42 AM  Between 7am to 7pm - Pager - [phone]  After 7pm go to [email or web address] - password [facility]  [facility] -  Office  [phone]

## 2016-03-20 ENCOUNTER — Telehealth: Payer: Self-pay

## 2016-03-20 LAB — CBC
HCT: 24.4 % — ABNORMAL LOW (ref 39.0–52.0)
HEMOGLOBIN: 8.2 g/dL — AB (ref 13.0–17.0)
MCH: 29.6 pg (ref 26.0–34.0)
MCHC: 33.6 g/dL (ref 30.0–36.0)
MCV: 88.1 fL (ref 78.0–100.0)
PLATELETS: 156 10*3/uL (ref 150–400)
RBC: 2.77 MIL/uL — ABNORMAL LOW (ref 4.22–5.81)
RDW: 16 % — AB (ref 11.5–15.5)
WBC: 3.7 10*3/uL — ABNORMAL LOW (ref 4.0–10.5)

## 2016-03-20 LAB — GLUCOSE, CAPILLARY
GLUCOSE-CAPILLARY: 255 mg/dL — AB (ref 65–99)
Glucose-Capillary: 295 mg/dL — ABNORMAL HIGH (ref 65–99)
Glucose-Capillary: 217 mg/dL — ABNORMAL HIGH (ref 65–99)
Glucose-Capillary: 258 mg/dL — ABNORMAL HIGH (ref 65–99)
Glucose-Capillary: 305 mg/dL — ABNORMAL HIGH (ref 65–99)

## 2016-03-20 LAB — BASIC METABOLIC PANEL
Anion gap: 8 (ref 5–15)
BUN: 22 mg/dL — AB (ref 6–20)
CALCIUM: 7.9 mg/dL — AB (ref 8.9–10.3)
CO2: 22 mmol/L (ref 22–32)
CREATININE: 1.58 mg/dL — AB (ref 0.61–1.24)
Chloride: 104 mmol/L (ref 101–111)
GFR calc non Af Amer: 42 mL/min — ABNORMAL LOW (ref >60)
GFR, EST AFRICAN AMERICAN: 48 mL/min — AB (ref >60)
Glucose, Bld: 219 mg/dL — ABNORMAL HIGH (ref 65–99)
Potassium: 4.1 mmol/L (ref 3.5–5.1)
SODIUM: 134 mmol/L — AB (ref 135–145)

## 2016-03-20 MED ORDER — CEFPODOXIME PROXETIL 200 MG PO TABS
200.0000 mg | ORAL_TABLET | Freq: Two times a day (BID) | ORAL | Status: DC
  Administered 2016-03-20 – 2016-03-21 (×3): 200 mg via ORAL
  Filled 2016-03-20 (×4): qty 1

## 2016-03-20 NOTE — Progress Notes (Signed)
Physical Therapy Treatment Patient Details Name: Michael [name] MRN: [id number] DOB: May 28, 1941 Today's Date: 03/20/2016    History of Present Illness Michael Crysler Jonesis a 75 y.o.malewith medical history significant of psoriasis on Humira, hypertension, hyperlipidemia, diabetes mellitus, GERD, hypothyroidism, gout, PAF on Eliquis, CAd, AVM of colon, CKD-III,who presents with shortness of breath, cough, fever, chills, rash. CAP with Sepsis    PT Comments    Pt progressing well with mobility, he ambulated 400' without an assistive device, no loss of balance. He had mild SOB, SaO2 99% on room air. PT goals met, will sign off. Encouraged pt to walk in halls 2-3x/day.   Follow Up Recommendations  No PT follow up (Anticipate speedy progress and no PT followup needs)     Equipment Recommendations  None recommended by PT    Recommendations for Other Services       Precautions / Restrictions Precautions Precautions: None Restrictions Weight Bearing Restrictions: No    Mobility  Bed Mobility               General bed mobility comments: up in recliner  Transfers Overall transfer level: Modified independent Equipment used: None Transfers: Sit to/from Stand           General transfer comment: uses armrests for sit to/from stand  Ambulation/Gait Ambulation/Gait assistance: Independent Ambulation Distance (Feet): 400 Feet Assistive device: None (and pushing IV pole) Gait Pattern/deviations: WFL(Within Functional Limits)     General Gait Details: steady, no LOB, SaO2 99% on RA, 1-2/4 dyspnea   Stairs            Wheelchair Mobility    Modified Rankin (Stroke Patients Only)       Balance Overall balance assessment: Independent                                  Cognition Arousal/Alertness: Awake/alert Behavior During Therapy: WFL for tasks assessed/performed Overall Cognitive Status: Within Functional Limits for tasks assessed                       Exercises      General Comments        Pertinent Vitals/Pain Pain Assessment: No/denies pain    Home Living                      Prior Function            PT Goals (current goals can now be found in the care plan section) Acute Rehab PT Goals Patient Stated Goal: work in garden, walk dog PT Goal Formulation: With patient/family Time For Goal Achievement: 03/25/16 Potential to Achieve Goals: Good Progress towards PT goals: Goals met/education completed, patient discharged from PT    Frequency  Min 3X/week    PT Plan Current plan remains appropriate    Co-evaluation             End of Session Equipment Utilized During Treatment: Gait belt Activity Tolerance: Patient tolerated treatment well Patient left: with call bell/phone within reach;in chair;with family/visitor present     Time: 2585-2778 PT Time Calculation (min) (ACUTE ONLY): 17 min  Charges:  $Gait Training: 8-22 mins                    G Codes:      Philomena Doheny 03/20/2016, 11:33 AM [phone]

## 2016-03-20 NOTE — Progress Notes (Addendum)
Inpatient Diabetes Program Recommendations  AACE/ADA: New Consensus Statement on Inpatient Glycemic Control (2015)  Target Ranges:  Prepandial:   less than 140 mg/dL      Peak postprandial:   less than 180 mg/dL (1-2 hours)      Critically ill patients:  140 - 180 mg/dL   Lab Results  Component Value Date   GLUCAP 217 (H) 03/20/2016   HGBA1C 8.9 (H) 03/17/2016    Review of Glycemic Control:  Results for [name], Michael [name] (MRN [id number]) as of 03/20/2016 12:10  Ref. Range 03/19/2016 08:13 03/19/2016 12:08 03/19/2016 16:32 03/19/2016 20:27 03/20/2016 08:03  Glucose-Capillary Latest Ref Range: 65 - 99 mg/dL 213 (H) 288 (H) 233 (H) 295 (H) 217 (H)   Diabetes history: Type 2 diabetes Outpatient Diabetes medications: Glucotrol 10 mg once daily, Toujeo 11 units daily Current orders for Inpatient glycemic control:  Lantus 8 units daily, Novolog 4 units tid with meals (hold if patient eats less than 50%), Novolog sensitive tid with meals  Inpatient Diabetes Program Recommendations:   Please consider increasing Lantus to 15 units daily.   Thanks, Adah Perl, RN, BC-ADM Inpatient Diabetes Coordinator Pager [phone] (8a-5p)   [phone]- Spoke with patient and wife regarding diabetes management. They state that he was started on insulin-Toujeo by his PCP on the day he was admitted to the hospital.  Wife is also on insulin and she gave him his first injection prior to coming to the hospital.  Discussed that Toujeo primarily affects fasting blood sugars and not meal intake.  Wife had several questions regarding "lows" that she had experienced with insulin.  Stressed the importance of avoiding low blood sugars.  Also discussed diet and exercise.  A1C indicates uncontrolled diabetes, however they state that his A1C has been in the 10%'s in the past.  Wife discussed concerns regarding PCP and states that she intends to get them a new dr.  Also discussed that glucotrol can cause hypoglycemia and treatment.    Thanks, Adah Perl, RN, BC-ADM

## 2016-03-20 NOTE — Care Management Important Message (Signed)
Important Message  Patient Details  Name: MOUSSA KOWALCHUK MRN: [id number] Date of Birth: 1940/08/27   Medicare Important Message Given:  Yes    Diamante Rubin, Rory Percy, RN 03/20/2016, 3:01 PM

## 2016-03-20 NOTE — Progress Notes (Addendum)
PROGRESS NOTE                                                                                                                                                                                                             Patient Demographics:    Michael [name], is a 75 y.o. male, DOB - October 07, 1940, [id number]  Admit date - 03/17/2016   Admitting Physician Ivor Costa, MD  Outpatient Primary MD for the patient is [name], MD  LOS - 3  Chief Complaint  Patient presents with  . Chest Pain  . Cough  . Weakness       Brief Narrative     Michael [name] is a 75 y.o. male with medical history significant of psoriasis on Humira, hypertension, hyperlipidemia, diabetes mellitus, GERD, hypothyroidism, gout, PAF on Eliquis, CAd, AVM of colon, CKD-III, who presents with shortness, cough, fever, chills, rash.   Patient states that he has been having chest pain, dry cough, SOB, fever, chills in the past 3 days. His chest pain is located in the left side of chest, constant, moderate, radiating to the back, sharp, pleuritic. It is aggravated by coughing and deep breath. No tenderness over calf areas. Patient denies nausea, vomiting, diarrhea, abdominal pain, symptoms of UTI or unilateral weakness. Pt notice some scattered pimple rashes in legs and arms recently.  ED Course: pt was found to have  WBC 10.8, temperature 102.6, no tachycardia, has tachypnea, oxygen saturation 93% on room air, lactate 1.90, troponin negative, was seen in renal function, sodium 131. Chest x-ray showed infiltration left upper lobe. Will admit to elemetry bed as inpt.    Subjective:    Michael [name] today has, No headache, No chest pain, No abdominal pain - No Nausea, No new weakness tingling or numbness, mild Cough - no SOB.  Had diarrhea yesterday which has completely resolved on 03/20/2016.   Assessment  & Plan :     1. CAP with Sepsis - Patient is  immunocompromised as he is on Humira, seems to have responded well to empiric IV antibiotics which are Vancomycin, Zosyn and Doxy, clinically improved, sepsis physiology has resolved after IV fluids, lactate is normal, will continue to hydrate and monitor cultures. Supportive care with oxygen and nebulizer treatments as needed. Will transition antibiotics to oral Vantin and doxycycline on 03/20/2016.  2. Paroxysmal  atrial fibrillation [place] vasc 2 score of 3. Continue Eliquis, beta blocker, Cardizem and amiodarone combination. Monitor blood pressure.  3. Dyslipidemia. Continue statin.  4. Psoriasis. On Humira outpatient.  5. Hypothyroidism. Continue Synthroid.  6. Essential hypertension. Continue combination of beta blocker and Cardizem.  7. GERD. On PPI.  8. ARF on CKD 3. Baseline creatinine close to 1.5. Worse due to #1 above, hydrate, stable renal ultrasound, monitor BMP.  9. Gout. On allopurinol.   10. Hyponatremia due to dehydration from sepsis. Hydrate and monitor.  11. Diarrhea on 03/19/2016. No further stools in the last 12 hours on 03/20/2016, stool studies suggest C. difficile: Eyes A she, he is clinically better, no leukocytosis will continue to monitor.   12. Anemia of chronic disease. Dilutional fall from IV fluids, outpatient monitor in one week. Outpatient workup by PCP as needed.   13. DM type II. On Lantus and sliding scale add premeal Novolog.  Lab Results  Component Value Date   HGBA1C 8.9 (H) 03/17/2016   CBG (last 3)   Recent Labs  03/19/16 1208 03/19/16 1632 03/20/16 0803  GLUCAP 288* 233* 217*    Family Communication  :  None present  Code Status :  Full  Diet : Heart Healthy  Disposition Plan  :  Home 03/21/2016  Consults  :  None  Procedures  :    DVT Prophylaxis  :  Eliquis  Lab Results  Component Value Date   PLT 156 03/20/2016    Inpatient Medications  Scheduled Meds: . allopurinol  200 mg Oral Daily  . amiodarone  200 mg Oral  Daily  . apixaban  5 mg Oral BID  . atorvastatin  40 mg Oral q1800  . cholecalciferol  1,000 Units Oral Daily  . diltiazem  180 mg Oral Daily  . doxycycline  100 mg Oral Q12H  . fenofibrate  160 mg Oral Daily  . ferrous sulfate  325 mg Oral TID WC  . insulin aspart  0-9 Units Subcutaneous TID WC  . insulin aspart  4 Units Subcutaneous TID WC  . insulin glargine  8 Units Subcutaneous q morning - 10a  . levothyroxine  150 mcg Oral QAC breakfast  . metoprolol  50 mg Oral BID  . pantoprazole  40 mg Oral Daily  . piperacillin-tazobactam  3.375 g Intravenous Once  . piperacillin-tazobactam (ZOSYN)  IV  3.375 g Intravenous Q8H  . sodium chloride  1,500 mL Intravenous Once  . valACYclovir  1,000 mg Oral Daily   Continuous Infusions:   PRN Meds:.albuterol, dextromethorphan-guaiFENesin, hydrALAZINE, HYDROcodone-acetaminophen  Antibiotics  :    Anti-infectives    Start     Dose/Rate Route Frequency Ordered Stop   03/19/16 0600  vancomycin (VANCOCIN) 1,250 mg in sodium chloride 0.9 % 250 mL IVPB     1,250 mg 166.7 mL/hr over 90 Minutes Intravenous Every 24 hours 03/17/16 2058 03/20/16 0755   03/18/16 2014  levofloxacin (LEVAQUIN) IVPB 750 mg  Status:  Discontinued     750 mg 100 mL/hr over 90 Minutes Intravenous Every 24 hours 03/17/16 2050 03/17/16 2107   03/18/16 1000  valACYclovir (VALTREX) tablet 1,000 mg     1,000 mg Oral Daily 03/17/16 2050     03/18/16 1000  doxycycline (VIBRA-TABS) tablet 100 mg     100 mg Oral Every 12 hours 03/17/16 2107     03/18/16 0400  piperacillin-tazobactam (ZOSYN) IVPB 3.375 g     3.375 g 12.5 mL/hr over 240 Minutes Intravenous Every 8  hours 03/17/16 2058     03/17/16 2130  piperacillin-tazobactam (ZOSYN) IVPB 3.375 g     3.375 g 100 mL/hr over 30 Minutes Intravenous  Once 03/17/16 2058     03/17/16 2130  vancomycin (VANCOCIN) 2,000 mg in sodium chloride 0.9 % 500 mL IVPB     2,000 mg 250 mL/hr over 120 Minutes Intravenous  Once 03/17/16 2058  03/18/16 0656   03/17/16 2015  levofloxacin (LEVAQUIN) IVPB 750 mg  Status:  Discontinued     750 mg 100 mL/hr over 90 Minutes Intravenous Every 48 hours 03/17/16 2006 03/17/16 2050         Objective:   Vitals:   03/19/16 1641 03/19/16 2029 03/20/16 0411 03/20/16 0808  BP: (!) 153/60 (!) 174/54 (!) 162/55 (!) 177/60  Pulse: 71 77 69 71  Resp:  19 18 19   Temp:  98.1 F (36.7 C) 99.4 F (37.4 C) 98 F (36.7 C)  TempSrc:  Oral Oral Oral  SpO2:  97% 97% 98%  Weight:      Height:        Wt Readings from Last 3 Encounters:  03/17/16 116.2 kg (256 lb 1.6 oz)  03/17/16 116.6 kg (257 lb)  12/20/15 119.7 kg (263 lb 12.8 oz)     Intake/Output Summary (Last 24 hours) at 03/20/16 0853 Last data filed at 03/20/16 [id number]  Gross per 24 hour  Intake          2083.33 ml  Output             1551 ml  Net           532.33 ml     Physical Exam  Awake Alert, Oriented X 3, No new F.N deficits, Normal affect [place].AT,PERRAL Supple Neck,No JVD, No cervical lymphadenopathy appriciated.  Symmetrical Chest wall movement, Good air movement bilaterally, few LLL rales RRR,No Gallops,Rubs or new Murmurs, No Parasternal Heave +ve B.Sounds, Abd Soft, No tenderness, No organomegaly appriciated, No rebound - guarding or rigidity. No Cyanosis, Clubbing or edema, No new Rash or bruise, has chronic macular rash in his lower extremities will monitor     Data Review:    CBC  Recent Labs Lab 03/17/16 1034 03/17/16 1634 03/18/16 0915 03/20/16 0542  WBC 10.8* 9.0 7.5 3.7*  HGB 10.8* 10.8* 8.9* 8.2*  HCT 30.9* 32.3* 26.8* 24.4*  PLT 180.0 150 124* 156  MCV 87.3 89.2 88.4 88.1  MCH  --  29.8 29.4 29.6  MCHC 34.8 33.4 33.2 33.6  RDW 16.2* 15.6* 15.8* 16.0*  LYMPHSABS 0.6*  --   --   --   MONOABS 0.8  --   --   --   EOSABS 0.0  --   --   --   BASOSABS 0.0  --   --   --     Chemistries   Recent Labs Lab 03/17/16 1034 03/17/16 1634 03/18/16 0915 03/19/16 0729 03/20/16 0542  NA 128* 131*  130* 132* 134*  K 5.0 4.5 3.9 4.4 4.1  CL 95* 98* 101 102 104  CO2 25 24 22 22 22   GLUCOSE 340* 280* 260* 217* 219*  BUN 32* 32* 33* 29* 22*  CREATININE 1.88* 1.95* 2.04* 1.85* 1.58*  CALCIUM 8.6 8.7* 7.9* 7.7* 7.9*   ------------------------------------------------------------------------------------------------------------------ No results for input(s): CHOL, HDL, LDLCALC, TRIG, CHOLHDL, LDLDIRECT in the last 72 hours.  Lab Results  Component Value Date   HGBA1C 8.9 (H) 03/17/2016   ------------------------------------------------------------------------------------------------------------------ No results for input(s): TSH, T4TOTAL, T3FREE,  THYROIDAB in the last 72 hours.  Invalid input(s): FREET3 ------------------------------------------------------------------------------------------------------------------  Recent Labs  03/17/16 1034  IRON 21*    Coagulation profile  Recent Labs Lab 03/17/16 2111  INR 1.62    No results for input(s): DDIMER in the last 72 hours.  Cardiac Enzymes No results for input(s): CKMB, TROPONINI, MYOGLOBIN in the last 168 hours.  Invalid input(s): CK ------------------------------------------------------------------------------------------------------------------    Component Value Date/Time   BNP 502.4 (H) 11/13/2014 1513    Micro Results Recent Results (from the past 240 hour(s))  Blood culture (routine x 2)     Status: None (Preliminary result)   Collection Time: 03/17/16  8:13 PM  Result Value Ref Range Status   Specimen Description BLOOD RIGHT ANTECUBITAL  Final   Special Requests BOTTLES DRAWN AEROBIC AND ANAEROBIC 5CC  Final   Culture NO GROWTH 2 DAYS  Final   Report Status PENDING  Incomplete  Blood culture (routine x 2)     Status: None (Preliminary result)   Collection Time: 03/17/16  8:19 PM  Result Value Ref Range Status   Specimen Description BLOOD LEFT ANTECUBITAL  Final   Special Requests BOTTLES DRAWN AEROBIC  AND ANAEROBIC 5CC  Final   Culture NO GROWTH 2 DAYS  Final   Report Status PENDING  Incomplete  C difficile quick scan w PCR reflex     Status: Abnormal   Collection Time: 03/19/16  7:46 PM  Result Value Ref Range Status   C Diff antigen POSITIVE (A) NEGATIVE Final   C Diff toxin NEGATIVE NEGATIVE Final   C Diff interpretation Results are indeterminate. See PCR results.  Final  Clostridium Difficile by PCR     Status: Abnormal   Collection Time: 03/19/16  7:46 PM  Result Value Ref Range Status   Toxigenic C Difficile by pcr POSITIVE (A) NEGATIVE Final    Comment: Positive for toxigenic C. difficile with little to no toxin production. Only treat if clinical presentation suggests symptomatic illness.    Radiology Reports Dg Chest 2 View  Result Date: 03/17/2016 CLINICAL DATA:  Left chest pain EXAM: CHEST  2 VIEW COMPARISON:  01/20/2015 FINDINGS: Left upper lobe opacity, suspicious for pneumonia. No pleural effusion or pneumothorax. The heart is normal in size. Visualized osseous structures are within normal limits. IMPRESSION: Left upper lobe opacity, suspicious for pneumonia. Follow-up chest radiographs are suggested in 4-6 weeks to document resolution. Electronically Signed   By: [name] M.D.   On: 03/17/2016 17:46   US Renal  Result Date: 03/18/2016 CLINICAL DATA:  Acute renal failure. EXAM: RENAL / URINARY TRACT ULTRASOUND COMPLETE COMPARISON:  Abdominal CT 02/09/2014 FINDINGS: Right Kidney: Length: 11.9 cm. Echogenicity within normal limits. No mass or hydronephrosis visualized. Left Kidney: Length: 12.5 cm. Small cyst in the interpolar region measures 10 mm maximally. No suspicious cortical findings. No evidence of hydronephrosis. Bladder: Appears normal for degree of bladder distention. Cholelithiasis and increased hepatic echogenicity most consistent with steatosis are noted incidentally. IMPRESSION: 1. Negative renal ultrasound.  No hydronephrosis. 2. Cholelithiasis and  hepatic steatosis noted. Electronically Signed   By: Richardean Sale M.D.   On: 03/18/2016 10:27    Time Spent in minutes  30   Bertine Schlottman K M.D on 03/20/2016 at 8:53 AM  Between 7am to 7pm - Pager - [phone]  After 7pm go to [email or web address] - password [email or web address]  [facility] -  Office  [phone]

## 2016-03-20 NOTE — Telephone Encounter (Signed)
Wife called about her husbands results. She had got a VM yesterday. They are in the hospital at the time. She is upset about this result thing. And as a few questions for you. Can please call the wife back on her cell once you have a chance. THank you.

## 2016-03-21 LAB — GLUCOSE, CAPILLARY
GLUCOSE-CAPILLARY: 233 mg/dL — AB (ref 65–99)
GLUCOSE-CAPILLARY: 301 mg/dL — AB (ref 65–99)

## 2016-03-21 MED ORDER — DOXYCYCLINE HYCLATE 100 MG PO TABS: 100.0000 mg | ORAL_TABLET | Freq: Two times a day (BID) | ORAL | 0 refills | Status: DC

## 2016-03-21 MED ORDER — LISINOPRIL 20 MG PO TABS: 20.0000 mg | ORAL_TABLET | Freq: Every morning | ORAL | 3 refills | Status: DC

## 2016-03-21 MED ORDER — CEFPODOXIME PROXETIL 200 MG PO TABS: 200.0000 mg | ORAL_TABLET | Freq: Two times a day (BID) | ORAL | 0 refills | Status: DC

## 2016-03-21 NOTE — Progress Notes (Signed)
Discharge instructions and medications reviewed with patient and patient's wife. All questions answered. Medications sent home with patient (topical meds).

## 2016-03-21 NOTE — Discharge Instructions (Signed)
Follow with Primary MD [name], MD in 2-3 days   Get CBC, CMP, 2 view Chest X ray checked  by Primary MD or SNF MD in 5-7 days ( we routinely change or add medications that can affect your baseline labs and fluid status, therefore we recommend that you get the mentioned basic workup next visit with your PCP, your PCP may decide not to get them or add new tests based on their clinical decision)   Activity: As tolerated with Full fall precautions use walker/cane & assistance as needed   Disposition Home     Diet:   Heart Healthy Low Carb.  Accuchecks 4 times/day, Once in AM empty stomach and then before each meal. Log in all results and show them to your Prim.MD in 3 days. If any glucose reading is under 80 or above 300 call your Prim MD immidiately. Follow Low glucose instructions for glucose under 80 as instructed.   For Heart failure patients - Check your Weight same time everyday, if you gain over 2 pounds, or you develop in leg swelling, experience more shortness of breath or chest pain, call your Primary MD immediately. Follow Cardiac Low Salt Diet and 1.5 lit/day fluid restriction.   On your next visit with your primary care physician please Get Medicines reviewed and adjusted.   Please request your Prim.MD to go over all Hospital Tests and Procedure/Radiological results at the follow up, please get all Hospital records sent to your Prim MD by signing hospital release before you go home.   If you experience worsening of your admission symptoms, develop shortness of breath, life threatening emergency, suicidal or homicidal thoughts you must seek medical attention immediately by calling 911 or calling your MD immediately  if symptoms less severe.  You Must read complete instructions/literature along with all the possible adverse reactions/side effects for all the Medicines you take and that have been prescribed to you. Take any new Medicines after you have completely  understood and accpet all the possible adverse reactions/side effects.   Do not drive, operate heavy machinery, perform activities at heights, swimming or participation in water activities or provide baby sitting services if your were admitted for syncope or siezures until you have seen by Primary MD or a Neurologist and advised to do so again.  Do not drive when taking Pain medications.    Do not take more than prescribed Pain, Sleep and Anxiety Medications  Special Instructions: If you have smoked or chewed Tobacco  in the last 2 yrs please stop smoking, stop any regular Alcohol  and or any Recreational drug use.  Wear Seat belts while driving.   Please note  You were cared for by a hospitalist during your hospital stay. If you have any questions about your discharge medications or the care you received while you were in the hospital after you are discharged, you can call the unit and asked to speak with the hospitalist on call if the hospitalist that took care of you is not available. Once you are discharged, your primary care physician will handle any further medical issues. Please note that NO REFILLS for any discharge medications will be authorized once you are discharged, as it is imperative that you return to your primary care physician (or establish a relationship with a primary care physician if you do not have one) for your aftercare needs so that they can reassess your need for medications and monitor your lab values.

## 2016-03-21 NOTE — Discharge Summary (Signed)
                                                                                 Michael [name] MRN:[id number] DOB: 09/27/1940 DOA: 03/17/2016  PCP: [name] [name], MD  Admit date: 03/17/2016  Discharge date: 03/21/2016  Admitted From: Home   Disposition:  Home   Recommendations for Outpatient Follow-up:   Follow up with PCP in 1-2 weeks  PCP Please obtain BMP/CBC, 2 view CXR in 1week,  (see Discharge instructions)   PCP Please follow up on the following pending results: None   Home Health: None Equipment/Devices: None  Consultations: None Discharge Condition: Stable   CODE STATUS: Full   Diet Recommendation: Heart Healthy Low Carb   Chief Complaint  Patient presents with  . Chest Pain  . Cough  . Weakness     Brief history of present illness from the day of admission and additional interim summary    Michael [name] a 75 y.o.malewith medical history significant of psoriasis on Humira, hypertension, hyperlipidemia, diabetes mellitus, GERD, hypothyroidism, gout, PAF on Eliquis, CAd, AVM of colon, CKD-III,who presents with shortness, cough, fever, chills, rash.   Patient states that he has been having chest pain, dry cough, SOB,fever, chills in the past 3 days. His chest pain is located in the left side of chest, constant, moderate, radiating to the back, sharp, pleuritic. It is aggravated by coughing and deep breath. No tenderness over calf areas. Patient denies nausea, vomiting, diarrhea, abdominal pain, symptoms of UTI or unilateral weakness. Pt notice somescatteredpimple rashes in legs and arms recently.  ED Course:pt was found to have WBC 10.8, temperature 102.6, no tachycardia, has tachypnea, oxygen saturation 93% on room air, lactate 1.90, troponin negative, was seen in renal function, sodium 131. Chest x-ray showed infiltration left upper lobe.  Hospital issues addressed     1. CAP with Sepsis - Patient is immunocompromised as he is on Humira, seems to have responded well to empiric IV antibiotics which are Vancomycin, Zosyn and Doxy, clinically improved, sepsis physiology has resolved after IV fluids, lactate normalized and today he is completely symptom-free off oxygen, eager to go home, able to stand up and walk by himself does not want home health PT RN . Will transition antibiotics to oral Vantin and doxycycline for 5 more days with follow-up with PCP for repeat CBC, BMP and a 2 view chest x-ray in the next 3-5 days.  2. Paroxysmal atrial fibrillation Chad vasc 2 score of 3. Continue Eliquis, beta blocker, Cardizem and amiodarone combination. Stable blood pressure and rate.  3. Dyslipidemia. Continue statin.  4. Psoriasis. On Humira outpatient.  5. Hypothyroidism. Continue Synthroid.  6. Essential hypertension. Renew home regimen except hold lisinopril for another day due to recent ARF.  7. GERD. On PPI.  8. ARF on CKD 3. Baseline creatinine close to 1.5. Now at baseline after hydration.  9. Gout. On allopurinol.   10. Hyponatremia due to dehydration from sepsis. Proved with hydration and repeat BMP in 3-4 days by PCP.  11. Diarrhea on 03/19/2016. No further stools in the last 12 hours on 03/20/2016, stool studies suggest C. difficile colonization, diarrhea has completely resolved in   the last 24 hours.   12. Anemia of chronic disease. Dilutional fall from IV fluids, outpatient monitor in one week. Outpatient workup by PCP as needed.   13. DM type II. Resume home regimen follow with PCP.   Discharge diagnosis     Principal Problem:   CAP (community acquired pneumonia) Active Problems:   DM (diabetes mellitus) (HCC)   Iron deficiency anemia due to chronic blood loss   HTN (hypertension), benign   Gout   Hypertriglyceridemia   Psoriasis   PAF (paroxysmal atrial fibrillation) (HCC)   Hyperlipidemia   Hypothyroidism   Acute  renal failure superimposed on stage 3 chronic kidney disease (HCC)   Sepsis (HCC)   Rash    Discharge instructions    Discharge Instructions    Discharge instructions    Complete by:  As directed   Follow with Primary MD [name] [name], MD in 2-3 days   Get CBC, CMP, 2 view Chest X ray checked  by Primary MD or SNF MD in 5-7 days ( we routinely change or add medications that can affect your baseline labs and fluid status, therefore we recommend that you get the mentioned basic workup next visit with your PCP, your PCP may decide not to get them or add new tests based on their clinical decision)   Activity: As tolerated with Full fall precautions use walker/cane & assistance as needed   Disposition Home     Diet:   Heart Healthy Low Carb.  Accuchecks 4 times/day, Once in AM empty stomach and then before each meal. Log in all results and show them to your Prim.MD in 3 days. If any glucose reading is under 80 or above 300 call your Prim MD immidiately. Follow Low glucose instructions for glucose under 80 as instructed.   For Heart failure patients - Check your Weight same time everyday, if you gain over 2 pounds, or you develop in leg swelling, experience more shortness of breath or chest pain, call your Primary MD immediately. Follow Cardiac Low Salt Diet and 1.5 lit/day fluid restriction.   On your next visit with your primary care physician please Get Medicines reviewed and adjusted.   Please request your Prim.MD to go over all Hospital Tests and Procedure/Radiological results at the follow up, please get all Hospital records sent to your Prim MD by signing hospital release before you go home.   If you experience worsening of your admission symptoms, develop shortness of breath, life threatening emergency, suicidal or homicidal thoughts you must seek medical attention immediately by calling 911 or calling your MD immediately  if symptoms less severe.  You Must read complete  instructions/literature along with all the possible adverse reactions/side effects for all the Medicines you take and that have been prescribed to you. Take any new Medicines after you have completely understood and accpet all the possible adverse reactions/side effects.   Do not drive, operate heavy machinery, perform activities at heights, swimming or participation in water activities or provide baby sitting services if your were admitted for syncope or siezures until you have seen by Primary MD or a Neurologist and advised to do so again.  Do not drive when taking Pain medications.    Do not take more than prescribed Pain, Sleep and Anxiety Medications  Special Instructions: If you have smoked or chewed Tobacco  in the last 2 yrs please stop smoking, stop any regular Alcohol  and or any Recreational drug use.  Wear Seat belts while driving.     Please note  You were cared for by a hospitalist during your hospital stay. If you have any questions about your discharge medications or the care you received while you were in the hospital after you are discharged, you can call the unit and asked to speak with the hospitalist on call if the hospitalist that took care of you is not available. Once you are discharged, your primary care physician will handle any further medical issues. Please note that NO REFILLS for any discharge medications will be authorized once you are discharged, as it is imperative that you return to your primary care physician (or establish a relationship with a primary care physician if you do not have one) for your aftercare needs so that they can reassess your need for medications and monitor your lab values.   Increase activity slowly    Complete by:  As directed      Discharge Medications     Medication List    TAKE these medications   acetaminophen 650 MG CR tablet Commonly known as:  TYLENOL Take 1,300 mg by mouth every 8 (eight) hours as needed for pain.     allopurinol 100 MG tablet Commonly known as:  ZYLOPRIM Take 2 tablets (200 mg total) by mouth daily.   amiodarone 200 MG tablet Commonly known as:  PACERONE Take 1 tablet (200 mg total) by mouth daily.   apixaban 5 MG Tabs tablet Commonly known as:  ELIQUIS Take 1 tablet (5 mg total) by mouth 2 (two) times daily.   atorvastatin 40 MG tablet Commonly known as:  LIPITOR Take 1 tablet (40 mg total) by mouth daily.   Blood Pressure Monitor Kit Use to check blood pressure daily Dx I10   cefpodoxime 200 MG tablet Commonly known as:  VANTIN Take 1 tablet (200 mg total) by mouth every 12 (twelve) hours.   Cholecalciferol 1000 units Tbdp Take 1,000 Units by mouth daily.   diltiazem 180 MG 24 hr capsule Commonly known as:  CARDIZEM CD Take 1 capsule (180 mg total) by mouth daily.   doxycycline 100 MG tablet Commonly known as:  VIBRA-TABS Take 1 tablet (100 mg total) by mouth 2 (two) times daily.   ergocalciferol 50000 units capsule Commonly known as:  VITAMIN D2 Take 1 capsule (50,000 Units total) by mouth once a week.   fenofibrate 160 MG tablet TAKE 1 TABLET(160 MG) BY MOUTH DAILY   ferrous sulfate 325 (65 FE) MG tablet Take 325 mg by mouth 3 (three) times daily with meals.   furosemide 20 MG tablet Commonly known as:  LASIX Take 1 tablet (20 mg total) by mouth daily.   glipiZIDE 10 MG tablet Commonly known as:  GLUCOTROL TAKE ONE TABLET BY MOUTH ONCE DAILY IN THE MORNING.   glucose blood test strip Commonly known as:  ONE TOUCH ULTRA TEST Use to check blood sugar daily. Dx: E11.9   HUMIRA PEN 40 MG/0.8ML Pnkt Generic drug:  Adalimumab Inject 40 mg into the skin every 14 (fourteen) days.   HYDROcodone-acetaminophen 5-325 MG tablet Commonly known as:  NORCO/VICODIN Take 1 tablet by mouth 2 (two) times daily as needed for moderate pain or severe pain.   Insulin Glargine 300 UNIT/ML Sopn Commonly known as:  TOUJEO SOLOSTAR Inject 11 Units into the skin every  morning. Titrate up by 1 unit every 1-2 days for goal sugars of 120-140   Insulin Pen Needle 31G X 5 MM Misc 1 Units by Does not apply route 4 (four) times daily.     levothyroxine 150 MCG tablet Commonly known as:  SYNTHROID, LEVOTHROID TAKE 1 TABLET BY MOUTH DAILY BEFORE BREAKFAST   lisinopril 20 MG tablet Commonly known as:  PRINIVIL,ZESTRIL Take 1 tablet (20 mg total) by mouth every morning. Start taking on:  03/23/2016 What changed:  See the new instructions.   metoprolol 50 MG tablet Commonly known as:  LOPRESSOR TAKE 1 TABLET(50 MG) BY MOUTH TWICE DAILY   omeprazole 40 MG capsule Commonly known as:  PRILOSEC TAKE 1 CAPSULE BY MOUTH EVERY DAY   ONE TOUCH ULTRA 2 w/Device Kit Use as directed Dx E11.9   onetouch ultrasoft lancets Use to check blood sugars daily       Follow-up Information    [name], MD. Schedule an appointment as soon as possible for a visit in 3 day(s).   Specialty:  Internal Medicine Contact information: [place] [address] [phone]           Major procedures and Radiology Reports - PLEASE review detailed and final reports thoroughly  -        Dg Chest 2 View  Result Date: 03/17/2016 CLINICAL DATA:  Left chest pain EXAM: CHEST  2 VIEW COMPARISON:  01/20/2015 FINDINGS: Left upper lobe opacity, suspicious for pneumonia. No pleural effusion or pneumothorax. The heart is normal in size. Visualized osseous structures are within normal limits. IMPRESSION: Left upper lobe opacity, suspicious for pneumonia. Follow-up chest radiographs are suggested in 4-6 weeks to document resolution. Electronically Signed   By: [name] M.D.   On: 03/17/2016 17:46   US Renal  Result Date: 03/18/2016 CLINICAL DATA:  Acute renal failure. EXAM: RENAL / URINARY TRACT ULTRASOUND COMPLETE COMPARISON:  Abdominal CT 02/09/2014 FINDINGS: Right Kidney: Length: 11.9 cm. Echogenicity within normal limits. No mass or hydronephrosis  visualized. Left Kidney: Length: 12.5 cm. Small cyst in the interpolar region measures 10 mm maximally. No suspicious cortical findings. No evidence of hydronephrosis. Bladder: Appears normal for degree of bladder distention. Cholelithiasis and increased hepatic echogenicity most consistent with steatosis are noted incidentally. IMPRESSION: 1. Negative renal ultrasound.  No hydronephrosis. 2. Cholelithiasis and hepatic steatosis noted. Electronically Signed   By: Richardean Sale M.D.   On: 03/18/2016 10:27    Micro Results    Recent Results (from the past 240 hour(s))  Blood culture (routine x 2)     Status: None (Preliminary result)   Collection Time: 03/17/16  8:13 PM  Result Value Ref Range Status   Specimen Description BLOOD RIGHT ANTECUBITAL  Final   Special Requests BOTTLES DRAWN AEROBIC AND ANAEROBIC 5CC  Final   Culture NO GROWTH 3 DAYS  Final   Report Status PENDING  Incomplete  Blood culture (routine x 2)     Status: None (Preliminary result)   Collection Time: 03/17/16  8:19 PM  Result Value Ref Range Status   Specimen Description BLOOD LEFT ANTECUBITAL  Final   Special Requests BOTTLES DRAWN AEROBIC AND ANAEROBIC 5CC  Final   Culture NO GROWTH 3 DAYS  Final   Report Status PENDING  Incomplete  C difficile quick scan w PCR reflex     Status: Abnormal   Collection Time: 03/19/16  7:46 PM  Result Value Ref Range Status   C Diff antigen POSITIVE (A) NEGATIVE Final   C Diff toxin NEGATIVE NEGATIVE Final   C Diff interpretation Results are indeterminate. See PCR results.  Final  Clostridium Difficile by PCR     Status: Abnormal   Collection Time: 03/19/16  7:46 PM  Result Value Ref Range Status   Toxigenic C Difficile by pcr POSITIVE (A) NEGATIVE Final    Comment: Positive for toxigenic C. difficile with little to no toxin production. Only treat if clinical presentation suggests symptomatic illness.    Today   Subjective    Lofton Leon today has no headache,no chest  abdominal pain,no new weakness tingling or numbness, feels much better wants to go home today.    Objective   Blood pressure (!) 172/53, pulse 68, temperature 98.2 F (36.8 C), temperature source Oral, resp. rate 18, height 6' (1.829 m), weight 116.2 kg (256 lb 1.6 oz), SpO2 96 %.   Intake/Output Summary (Last 24 hours) at 03/21/16 1016 Last data filed at 03/21/16 0644  Gross per 24 hour  Intake              480 ml  Output                0 ml  Net              480 ml    Exam Awake Alert, Oriented x 3, No new F.N deficits, Normal affect [place].AT,PERRAL Supple Neck,No JVD, No cervical lymphadenopathy appriciated.  Symmetrical Chest wall movement, Good air movement bilaterally, CTAB RRR,No Gallops,Rubs or new Murmurs, No Parasternal Heave +ve B.Sounds, Abd Soft, Non tender, No organomegaly appriciated, No rebound -guarding or rigidity. No Cyanosis, Clubbing or edema, No new Rash or bruise   Data Review   CBC w Diff: Lab Results  Component Value Date   WBC 3.7 (L) 03/20/2016   HGB 8.2 (L) 03/20/2016   HGB 12.8 (L) 07/26/2014   HCT 24.4 (L) 03/20/2016   HCT 37.6 (L) 07/26/2014   PLT 156 03/20/2016   PLT 171 07/26/2014   LYMPHOPCT 5.6 Repeated and verified X2. (L) 03/17/2016   LYMPHOPCT 7.7 (L) 07/26/2014   MONOPCT 7.4 03/17/2016   MONOPCT 6.6 07/26/2014   EOSPCT 0.2 03/17/2016   EOSPCT 1.2 07/26/2014   BASOPCT 0.3 03/17/2016   BASOPCT 1.2 07/26/2014    CMP: Lab Results  Component Value Date   NA 134 (L) 03/20/2016   NA 137 07/26/2014   K 4.1 03/20/2016   K 5.1 07/26/2014   CL 104 03/20/2016   CL 104 01/12/2013   CO2 22 03/20/2016   CO2 26 07/26/2014   BUN 22 (H) 03/20/2016   BUN 17.1 07/26/2014   CREATININE 1.58 (H) 03/20/2016   CREATININE 1.1 07/26/2014   GLU 263 10/09/2013   PROT 7.0 03/20/2015   PROT 7.6 07/26/2014   ALBUMIN 4.3 03/20/2015   ALBUMIN 3.7 07/26/2014   BILITOT 0.5 03/20/2015   BILITOT 0.67 07/26/2014   ALKPHOS 42 03/20/2015   ALKPHOS 77  07/26/2014   AST 18 03/20/2015   AST 13 07/26/2014   ALT 15 03/20/2015   ALT 13 07/26/2014  .   Total Time in preparing paper work, data evaluation and todays exam - 35 minutes  Thurnell Lose M.D on 03/21/2016 at 10:16 AM  [facility]   Office  [phone]

## 2016-03-22 LAB — CULTURE, BLOOD (ROUTINE X 2)
CULTURE: NO GROWTH
Culture: NO GROWTH

## 2016-03-23 ENCOUNTER — Telehealth: Payer: Self-pay | Admitting: *Deleted

## 2016-03-23 NOTE — Telephone Encounter (Signed)
Transition Care Management Follow-up Telephone Call   Date discharged? 03/21/16  How have you been since you were released from the hospital? Spoke with wife she states he seem to be doing good   Do you understand why you were in the hospital? YES   Do you understand the discharge instructions? YES   Where were you discharged to? Home   Items Reviewed:  Medications reviewed: YES  Allergies reviewed: YES  Dietary changes reviewed: YES  Referrals reviewed: No referral needed   Functional Questionnaire:   Activities of Daily Living (ADLs):   She states she are independent in the following: ambulation, bathing and hygiene, feeding, continence, grooming, toileting and dressing States he require assistance with the following: ambulation   Any transportation issues/concerns?: NO   Any patient concerns? NO   Confirmed importance and date/time of follow-up visits scheduled YES, appt already made 03/24/16  Provider Appointment booked with Dr. Alain Marion  Confirmed with patient if condition begins to worsen call PCP or go to the ER.  Patient was given the office number and encouraged to call back with question or concerns.  : YES

## 2016-03-23 NOTE — Telephone Encounter (Signed)
I called pt's wife- I answered all her questions and advised her to keep pt's ROV for hosp f/u as scheduled on 03/24/16.

## 2016-03-24 ENCOUNTER — Encounter: Payer: Self-pay | Admitting: Internal Medicine

## 2016-03-24 ENCOUNTER — Ambulatory Visit (INDEPENDENT_AMBULATORY_CARE_PROVIDER_SITE_OTHER): Payer: Medicare Other | Admitting: Internal Medicine

## 2016-03-24 VITALS — BP 140/70 | HR 58 | Temp 97.7°F | Wt 253.0 lb

## 2016-03-24 DIAGNOSIS — Z794 Long term (current) use of insulin: Secondary | ICD-10-CM | POA: Diagnosis not present

## 2016-03-24 DIAGNOSIS — Z23 Encounter for immunization: Secondary | ICD-10-CM | POA: Diagnosis not present

## 2016-03-24 DIAGNOSIS — E119 Type 2 diabetes mellitus without complications: Secondary | ICD-10-CM

## 2016-03-24 DIAGNOSIS — I480 Paroxysmal atrial fibrillation: Secondary | ICD-10-CM

## 2016-03-24 DIAGNOSIS — E1122 Type 2 diabetes mellitus with diabetic chronic kidney disease: Secondary | ICD-10-CM

## 2016-03-24 DIAGNOSIS — J189 Pneumonia, unspecified organism: Secondary | ICD-10-CM | POA: Diagnosis not present

## 2016-03-24 DIAGNOSIS — N183 Chronic kidney disease, stage 3 unspecified: Secondary | ICD-10-CM

## 2016-03-24 DIAGNOSIS — Z5189 Encounter for other specified aftercare: Secondary | ICD-10-CM | POA: Diagnosis not present

## 2016-03-24 DIAGNOSIS — M1000 Idiopathic gout, unspecified site: Secondary | ICD-10-CM

## 2016-03-24 MED ORDER — INSULIN GLARGINE 300 UNIT/ML ~~LOC~~ SOPN: 30.0000 [IU] | PEN_INJECTOR | Freq: Every morning | SUBCUTANEOUS | 5 refills | Status: DC

## 2016-03-24 NOTE — Assessment & Plan Note (Signed)
On amlodipine, cardizem, Eliquis 

## 2016-03-24 NOTE — Assessment & Plan Note (Signed)
Allopurinol  

## 2016-03-24 NOTE — Assessment & Plan Note (Signed)
Titrate up Toujeo by 1-2 u a day: goal sugars of 120-150

## 2016-03-24 NOTE — Assessment & Plan Note (Signed)
Finish po abx RTC 6 wks w/a CXR

## 2016-03-24 NOTE — Progress Notes (Signed)
Subjective:  Patient ID: Michael [name], male    DOB: 1941/04/27  Age: 75 y.o. MRN: [id number]  CC: No chief complaint on file.   HPI Michael [name] presents for a post-hosp stay for pneumonia, LUL opacity on the CXR - d/c'd on 8/26. F/u DM - high CBGs >250-300 on 25 u/day of Toujeo  Outpatient Medications Prior to Visit  Medication Sig Dispense Refill  . acetaminophen (TYLENOL) 650 MG CR tablet Take 1,300 mg by mouth every 8 (eight) hours as needed for pain.     [name] allopurinol (ZYLOPRIM) 100 MG tablet Take 2 tablets (200 mg total) by mouth daily. 60 tablet 11  . amiodarone (PACERONE) 200 MG tablet Take 1 tablet (200 mg total) by mouth daily. 30 tablet 11  . apixaban (ELIQUIS) 5 MG TABS tablet Take 1 tablet (5 mg total) by mouth 2 (two) times daily. 60 tablet 5  . atorvastatin (LIPITOR) 40 MG tablet Take 1 tablet (40 mg total) by mouth daily. 90 tablet 2  . Blood Glucose Monitoring Suppl (ONE TOUCH ULTRA 2) W/DEVICE KIT Use as directed Dx E11.9 1 each 0  . Blood Pressure Monitor KIT Use to check blood pressure daily Dx I10 1 each 0  . cefpodoxime (VANTIN) 200 MG tablet Take 1 tablet (200 mg total) by mouth every 12 (twelve) hours. 10 tablet 0  . Cholecalciferol 1000 UNITS TBDP Take 1,000 Units by mouth daily.    [name] diltiazem (CARDIZEM CD) 180 MG 24 hr capsule Take 1 capsule (180 mg total) by mouth daily. 30 capsule 10  . doxycycline (VIBRA-TABS) 100 MG tablet Take 1 tablet (100 mg total) by mouth 2 (two) times daily. 10 tablet 0  . ergocalciferol (VITAMIN D2) 50000 UNITS capsule Take 1 capsule (50,000 Units total) by mouth once a week. 6 capsule 0  . fenofibrate 160 MG tablet TAKE 1 TABLET(160 MG) BY MOUTH DAILY 30 tablet 11  . ferrous sulfate 325 (65 FE) MG tablet Take 325 mg by mouth 3 (three) times daily with meals.    . furosemide (LASIX) 20 MG tablet Take 1 tablet (20 mg total) by mouth daily. 90 tablet 3  . glipiZIDE (GLUCOTROL) 10 MG tablet TAKE ONE TABLET BY MOUTH ONCE DAILY IN THE  MORNING. 30 tablet 11  . glucose blood (ONE TOUCH ULTRA TEST) test strip Use to check blood sugar daily. Dx: E11.9 100 each 3  . HUMIRA PEN 40 MG/0.8ML PNKT Inject 40 mg into the skin every 14 (fourteen) days.    [name] HYDROcodone-acetaminophen (NORCO/VICODIN) 5-325 MG tablet Take 1 tablet by mouth 2 (two) times daily as needed for moderate pain or severe pain. 100 tablet 0  . Insulin Glargine (TOUJEO SOLOSTAR) 300 UNIT/ML SOPN Inject 11 Units into the skin every morning. Titrate up by 1 unit every 1-2 days for goal sugars of 120-140 3 pen 5  . Insulin Pen Needle 31G X 5 MM MISC 1 Units by Does not apply route 4 (four) times daily. 150 each 11  . Lancets (ONETOUCH ULTRASOFT) lancets Use to check blood sugars daily 30 each 11  . levothyroxine (SYNTHROID, LEVOTHROID) 150 MCG tablet TAKE 1 TABLET BY MOUTH DAILY BEFORE BREAKFAST 90 tablet 3  . lisinopril (PRINIVIL,ZESTRIL) 20 MG tablet Take 1 tablet (20 mg total) by mouth every morning. 90 tablet 3  . metoprolol (LOPRESSOR) 50 MG tablet TAKE 1 TABLET(50 MG) BY MOUTH TWICE DAILY 180 tablet 1  . omeprazole (PRILOSEC) 40 MG capsule TAKE 1 CAPSULE BY MOUTH  EVERY DAY 90 capsule 1   No facility-administered medications prior to visit.     ROS Review of Systems  Constitutional: Negative for appetite change, fatigue and unexpected weight change.  HENT: Negative for congestion, nosebleeds, sneezing, sore throat and trouble swallowing.   Eyes: Negative for itching and visual disturbance.  Respiratory: Positive for cough.   Cardiovascular: Negative for chest pain, palpitations and leg swelling.  Gastrointestinal: Negative for abdominal distention, blood in stool, diarrhea and nausea.  Genitourinary: Negative for frequency and hematuria.  Musculoskeletal: Negative for back pain, gait problem, joint swelling and neck pain.  Skin: Negative for rash.  Neurological: Negative for dizziness, tremors, speech difficulty and weakness.  Psychiatric/Behavioral: Negative  for agitation, dysphoric mood and sleep disturbance. The patient is not nervous/anxious.     Objective:  BP 140/70   Pulse (!) 58   Temp 97.7 F (36.5 C) (Oral)   Wt 253 lb (114.8 kg)   SpO2 97%   BMI 34.31 kg/m   BP Readings from Last 3 Encounters:  03/24/16 140/70  03/21/16 (!) 172/53  03/17/16 140/60    Wt Readings from Last 3 Encounters:  03/24/16 253 lb (114.8 kg)  03/17/16 256 lb 1.6 oz (116.2 kg)  03/17/16 257 lb (116.6 kg)    Physical Exam  Constitutional: He is oriented to person, place, and time. He appears well-developed. No distress.  NAD  HENT:  Mouth/Throat: Oropharynx is clear and moist.  Eyes: Conjunctivae are normal. Pupils are equal, round, and reactive to light.  Neck: Normal range of motion. No JVD present. No thyromegaly present.  Cardiovascular: Normal rate, regular rhythm, normal heart sounds and intact distal pulses.  Exam reveals no gallop and no friction rub.   No murmur heard. Pulmonary/Chest: Effort normal and breath sounds normal. No respiratory distress. He has no wheezes. He has no rales. He exhibits no tenderness.  Abdominal: Soft. Bowel sounds are normal. He exhibits no distension and no mass. There is no tenderness. There is no rebound and no guarding.  Musculoskeletal: Normal range of motion. He exhibits edema and tenderness.  Lymphadenopathy:    He has no cervical adenopathy.  Neurological: He is alert and oriented to person, place, and time. He has normal reflexes. No cranial nerve deficit. He exhibits normal muscle tone. He displays a negative Romberg sign. Coordination abnormal. Gait normal.  Skin: Skin is warm and dry. Rash noted.  Psychiatric: He has a normal mood and affect. His behavior is normal. Judgment and thought content normal.    Lab Results  Component Value Date   WBC 3.7 (L) 03/20/2016   HGB 8.2 (L) 03/20/2016   HCT 24.4 (L) 03/20/2016   PLT 156 03/20/2016   GLUCOSE 219 (H) 03/20/2016   CHOL 100 03/20/2015   TRIG  (H) 03/20/2015    795.0 Triglyceride is over 400; calculations on Lipids are invalid.   HDL 22.30 (L) 03/20/2015   LDLDIRECT 26.0 03/20/2015   LDLCALC UNABLE TO CALCULATE IF TRIGLYCERIDE OVER 400 mg/dL 10/12/2014   ALT 15 03/20/2015   AST 18 03/20/2015   NA 134 (L) 03/20/2016   K 4.1 03/20/2016   CL 104 03/20/2016   CREATININE 1.58 (H) 03/20/2016   BUN 22 (H) 03/20/2016   CO2 22 03/20/2016   TSH 1.89 09/17/2015   PSA 0.50 11/07/2013   INR 1.62 03/17/2016   HGBA1C 8.9 (H) 03/17/2016   MICROALBUR 1.2 05/16/2013    Dg Chest 2 View  Result Date: 03/17/2016 CLINICAL DATA:  Left chest pain  EXAM: CHEST  2 VIEW COMPARISON:  01/20/2015 FINDINGS: Left upper lobe opacity, suspicious for pneumonia. No pleural effusion or pneumothorax. The heart is normal in size. Visualized osseous structures are within normal limits. IMPRESSION: Left upper lobe opacity, suspicious for pneumonia. Follow-up chest radiographs are suggested in 4-6 weeks to document resolution. Electronically Signed   By: [name] M.D.   On: 03/17/2016 17:46   US Renal  Result Date: 03/18/2016 CLINICAL DATA:  Acute renal failure. EXAM: RENAL / URINARY TRACT ULTRASOUND COMPLETE COMPARISON:  Abdominal CT 02/09/2014 FINDINGS: Right Kidney: Length: 11.9 cm. Echogenicity within normal limits. No mass or hydronephrosis visualized. Left Kidney: Length: 12.5 cm. Small cyst in the interpolar region measures 10 mm maximally. No suspicious cortical findings. No evidence of hydronephrosis. Bladder: Appears normal for degree of bladder distention. Cholelithiasis and increased hepatic echogenicity most consistent with steatosis are noted incidentally. IMPRESSION: 1. Negative renal ultrasound.  No hydronephrosis. 2. Cholelithiasis and hepatic steatosis noted. Electronically Signed   By: Richardean Sale M.D.   On: 03/18/2016 10:27    Assessment & Plan:   There are no diagnoses linked to this encounter. I am having Mr. Manfred maintain his  acetaminophen, ferrous sulfate, ergocalciferol, ONE TOUCH ULTRA 2, onetouch ultrasoft, Cholecalciferol, HUMIRA PEN, amiodarone, fenofibrate, Blood Pressure Monitor, omeprazole, apixaban, metoprolol, furosemide, HYDROcodone-acetaminophen, diltiazem, glipiZIDE, glucose blood, allopurinol, atorvastatin, levothyroxine, Insulin Glargine, Insulin Pen Needle, lisinopril, cefpodoxime, and doxycycline.  No orders of the defined types were placed in this encounter.    Follow-up: No Follow-up on file.  [name], MD

## 2016-03-24 NOTE — Patient Instructions (Signed)
Titrate up Toujeo by 1-2 u a day: goal sugars of 120-150

## 2016-03-24 NOTE — Progress Notes (Signed)
Pre visit review using our clinic review tool, if applicable. No additional management support is needed unless otherwise documented below in the visit note. 

## 2016-04-03 ENCOUNTER — Telehealth: Payer: Self-pay | Admitting: Internal Medicine

## 2016-04-03 MED ORDER — GLUCOSE BLOOD VI STRP: ORAL_STRIP | 3 refills | Status: DC

## 2016-04-03 MED ORDER — GLUCOSE BLOOD VI STRP: 1.0000 | ORAL_STRIP | Freq: Two times a day (BID) | 3 refills | Status: DC

## 2016-04-03 NOTE — Telephone Encounter (Signed)
Done. See meds.  

## 2016-04-03 NOTE — Telephone Encounter (Signed)
Pharmacy called back he states the pt states MD told him he would send new rx on his testing strips because he is testing more than once a day. We are needing updated script. Per chart last ov pt should be checking blood sugars twice a day. Sending new rx...Michael [name]

## 2016-04-03 NOTE — Addendum Note (Signed)
Addended by: Earnstine Regal on: 04/03/2016 02:54 PM   Modules accepted: Orders

## 2016-04-03 NOTE — Telephone Encounter (Signed)
Patient is needing 100 quantity of one touch ultra II test strips to be sent to [place].  States that the script needs to read that patient is on insulin.

## 2016-04-06 MED ORDER — GLUCOSE BLOOD VI STRP: 1.0000 | ORAL_STRIP | Freq: Four times a day (QID) | 5 refills | Status: DC

## 2016-04-06 NOTE — Telephone Encounter (Signed)
Called [name] spoke w/pharmacist inform rx was faxed on Friday which they did recioeve, but pt states MD increase it to four timea a day, and if he is insulin dependant it need to be on script. Inform will update script and fax to them...Michael [name]

## 2016-04-06 NOTE — Addendum Note (Signed)
Addended by: Earnstine Regal on: 04/06/2016 09:49 AM   Modules accepted: Orders

## 2016-04-06 NOTE — Telephone Encounter (Signed)
Patient wife called to advise that the patient was told at the hospital that he is supposed to be checking 4 times daily. [facility] is also stating that they still have not received the updated DX code to fill the one sent 04/03/2016

## 2016-04-08 NOTE — Telephone Encounter (Signed)
We have sent new rx for pt to check four times a day. Insurance will not cover unless it is document in pt chanrt

## 2016-04-08 NOTE — Telephone Encounter (Signed)
Pt wife called back about this. States the pharmacy needs a code and new prescription. Please advise thanks.

## 2016-04-09 NOTE — Telephone Encounter (Signed)
Dr. Alain Marion- Please addend your 03/24/16 to states that you want patient testing CBGs at home four times daily. This is needed for insurance purposes so patient can fill his Dm testing supplies. Once done I will fax your note to pharmacy at [phone].

## 2016-04-10 ENCOUNTER — Telehealth: Payer: Self-pay | Admitting: Internal Medicine

## 2016-04-10 NOTE — Telephone Encounter (Signed)
Michael [name] called from Michael [name] said she fax over request for more documentation or note of the change of test strip in order for medicare to pay for it. Please check help pt , he is out of this.

## 2016-04-14 NOTE — Telephone Encounter (Signed)
Dr. Alain Marion- Please addend your 03/24/16 to state that you want patient testing CBGs at home four times daily AND WHY. It must be in OV note, not just on Rx. This is needed for insurance purposes so patient can fill his DM testing supplies. Once done I will fax your note to pharmacy at [phone].

## 2016-04-15 ENCOUNTER — Ambulatory Visit (INDEPENDENT_AMBULATORY_CARE_PROVIDER_SITE_OTHER)
Admission: RE | Admit: 2016-04-15 | Discharge: 2016-04-15 | Disposition: A | Discharge status: Home / Self Care | Payer: Medicare Other | Source: Ambulatory Visit | Attending: Internal Medicine | Admitting: Internal Medicine

## 2016-04-15 ENCOUNTER — Other Ambulatory Visit (INDEPENDENT_AMBULATORY_CARE_PROVIDER_SITE_OTHER): Payer: Medicare Other

## 2016-04-15 DIAGNOSIS — N183 Chronic kidney disease, stage 3 unspecified: Secondary | ICD-10-CM

## 2016-04-15 DIAGNOSIS — Z794 Long term (current) use of insulin: Secondary | ICD-10-CM

## 2016-04-15 DIAGNOSIS — J189 Pneumonia, unspecified organism: Secondary | ICD-10-CM

## 2016-04-15 DIAGNOSIS — E1122 Type 2 diabetes mellitus with diabetic chronic kidney disease: Secondary | ICD-10-CM

## 2016-04-15 LAB — BASIC METABOLIC PANEL
BUN: 27 mg/dL — ABNORMAL HIGH (ref 6–23)
CALCIUM: 9 mg/dL (ref 8.4–10.5)
CHLORIDE: 99 meq/L (ref 96–112)
CO2: 28 meq/L (ref 19–32)
Creatinine, Ser: 1.56 mg/dL — ABNORMAL HIGH (ref 0.40–1.50)
GFR: 46.53 mL/min — ABNORMAL LOW (ref >60.00)
GLUCOSE: 321 mg/dL — AB (ref 70–99)
Potassium: 5 mEq/L (ref 3.5–5.1)
SODIUM: 134 meq/L — AB (ref 135–145)

## 2016-04-15 NOTE — Telephone Encounter (Signed)
OK. Thx

## 2016-04-17 ENCOUNTER — Encounter: Payer: Self-pay | Admitting: Internal Medicine

## 2016-04-17 ENCOUNTER — Ambulatory Visit (INDEPENDENT_AMBULATORY_CARE_PROVIDER_SITE_OTHER): Payer: Medicare Other | Admitting: Internal Medicine

## 2016-04-17 DIAGNOSIS — E1151 Type 2 diabetes mellitus with diabetic peripheral angiopathy without gangrene: Secondary | ICD-10-CM | POA: Diagnosis not present

## 2016-04-17 DIAGNOSIS — J189 Pneumonia, unspecified organism: Secondary | ICD-10-CM

## 2016-04-17 DIAGNOSIS — I6523 Occlusion and stenosis of bilateral carotid arteries: Secondary | ICD-10-CM

## 2016-04-17 DIAGNOSIS — I10 Essential (primary) hypertension: Secondary | ICD-10-CM | POA: Diagnosis not present

## 2016-04-17 DIAGNOSIS — I480 Paroxysmal atrial fibrillation: Secondary | ICD-10-CM | POA: Diagnosis not present

## 2016-04-17 DIAGNOSIS — D649 Anemia, unspecified: Secondary | ICD-10-CM

## 2016-04-17 MED ORDER — INSULIN GLARGINE 300 UNIT/ML ~~LOC~~ SOPN: 50.0000 [IU] | PEN_INJECTOR | Freq: Every day | SUBCUTANEOUS | 11 refills | Status: DC

## 2016-04-17 NOTE — Progress Notes (Signed)
Pre visit review using our clinic review tool, if applicable. No additional management support is needed unless otherwise documented below in the visit note. 

## 2016-04-17 NOTE — Patient Instructions (Signed)
Titrate Toujeo up

## 2016-04-17 NOTE — Assessment & Plan Note (Signed)
On amlodipine, cardizem, Eliquis

## 2016-04-17 NOTE — Assessment & Plan Note (Signed)
On Toujeo 45 u/d - 2017. Poor control Titrate up Toujeo

## 2016-04-17 NOTE — Assessment & Plan Note (Signed)
Re-start Iron

## 2016-04-17 NOTE — Assessment & Plan Note (Signed)
Chronic - on Lisinopril, Metoprolol, Lasix, Diltazem Risks associated with treatment and diet (NAS) noncompliance were discussed. Compliance was encouraged. NAS diet

## 2016-04-17 NOTE — Assessment & Plan Note (Signed)
Repeat CXR in 4 weeks

## 2016-04-17 NOTE — Progress Notes (Signed)
Subjective:  Patient ID: [name], male    DOB: 1941-06-05  Age: 75 y.o. MRN: [id number]  CC: No chief complaint on file.   HPI JOHNN KRASOWSKI presents for CAP w/sepsis - d/c'd on 8/26 after a few days hospital stay. CBGs are high on 45 u/d. F/u DM, HTN, arthritis  Outpatient Medications Prior to Visit  Medication Sig Dispense Refill  . acetaminophen (TYLENOL) 650 MG CR tablet Take 1,300 mg by mouth every 8 (eight) hours as needed for pain.     [name] allopurinol (ZYLOPRIM) 100 MG tablet Take 2 tablets (200 mg total) by mouth daily. 60 tablet 11  . amiodarone (PACERONE) 200 MG tablet Take 1 tablet (200 mg total) by mouth daily. 30 tablet 11  . apixaban (ELIQUIS) 5 MG TABS tablet Take 1 tablet (5 mg total) by mouth 2 (two) times daily. 60 tablet 5  . atorvastatin (LIPITOR) 40 MG tablet Take 1 tablet (40 mg total) by mouth daily. 90 tablet 2  . Blood Glucose Monitoring Suppl (ONE TOUCH ULTRA 2) W/DEVICE KIT Use as directed Dx E11.9 1 each 0  . Blood Pressure Monitor KIT Use to check blood pressure daily Dx I10 1 each 0  . Cholecalciferol 1000 UNITS TBDP Take 1,000 Units by mouth daily.    [name] diltiazem (CARDIZEM CD) 180 MG 24 hr capsule Take 1 capsule (180 mg total) by mouth daily. 30 capsule 10  . ergocalciferol (VITAMIN D2) 50000 UNITS capsule Take 1 capsule (50,000 Units total) by mouth once a week. 6 capsule 0  . fenofibrate 160 MG tablet TAKE 1 TABLET(160 MG) BY MOUTH DAILY 30 tablet 11  . ferrous sulfate 325 (65 FE) MG tablet Take 325 mg by mouth 3 (three) times daily with meals.    . furosemide (LASIX) 20 MG tablet Take 1 tablet (20 mg total) by mouth daily. 90 tablet 3  . glucose blood (ONE TOUCH ULTRA TEST) test strip 1 each by Other route 4 (four) times daily. Use to check blood sugar four times a day  Dx: E11.9- insulin Dependant 120 each 5  . HUMIRA PEN 40 MG/0.8ML PNKT Inject 40 mg into the skin every 14 (fourteen) days.    [name] HYDROcodone-acetaminophen (NORCO/VICODIN) 5-325 MG  tablet Take 1 tablet by mouth 2 (two) times daily as needed for moderate pain or severe pain. 100 tablet 0  . Insulin Glargine (TOUJEO SOLOSTAR) 300 UNIT/ML SOPN Inject 30 Units into the skin every morning. Titrate up by 1 unit every 1-2 days for goal sugars of 120-150 3 pen 5  . Insulin Pen Needle 31G X 5 MM MISC 1 Units by Does not apply route 4 (four) times daily. 150 each 11  . Lancets (ONETOUCH ULTRASOFT) lancets Use to check blood sugars daily 30 each 11  . levothyroxine (SYNTHROID, LEVOTHROID) 150 MCG tablet TAKE 1 TABLET BY MOUTH DAILY BEFORE BREAKFAST 90 tablet 3  . lisinopril (PRINIVIL,ZESTRIL) 20 MG tablet Take 1 tablet (20 mg total) by mouth every morning. 90 tablet 3  . metoprolol (LOPRESSOR) 50 MG tablet TAKE 1 TABLET(50 MG) BY MOUTH TWICE DAILY 180 tablet 1  . omeprazole (PRILOSEC) 40 MG capsule TAKE 1 CAPSULE BY MOUTH EVERY DAY 90 capsule 1  . cefpodoxime (VANTIN) 200 MG tablet Take 1 tablet (200 mg total) by mouth every 12 (twelve) hours. (Patient not taking: Reported on 04/17/2016) 10 tablet 0  . doxycycline (VIBRA-TABS) 100 MG tablet Take 1 tablet (100 mg total) by mouth 2 (two) times daily. (  Patient not taking: Reported on 04/17/2016) 10 tablet 0   No facility-administered medications prior to visit.     ROS Review of Systems  Constitutional: Negative for appetite change, fatigue and unexpected weight change.  HENT: Negative for congestion, nosebleeds, sneezing, sore throat and trouble swallowing.   Eyes: Negative for itching and visual disturbance.  Respiratory: Positive for cough.   Cardiovascular: Negative for chest pain, palpitations and leg swelling.  Gastrointestinal: Negative for abdominal distention, blood in stool, diarrhea and nausea.  Genitourinary: Negative for frequency and hematuria.  Musculoskeletal: Negative for back pain, gait problem, joint swelling and neck pain.  Skin: Negative for rash.  Neurological: Negative for dizziness, tremors, speech difficulty  and weakness.  Psychiatric/Behavioral: Negative for agitation, dysphoric mood and sleep disturbance. The patient is not nervous/anxious.     Objective:  BP (!) 150/68   Pulse (!) 57   Wt 255 lb (115.7 kg)   SpO2 97%   BMI 34.58 kg/m   BP Readings from Last 3 Encounters:  04/17/16 (!) 150/68  03/24/16 140/70  03/21/16 (!) 172/53    Wt Readings from Last 3 Encounters:  04/17/16 255 lb (115.7 kg)  03/24/16 253 lb (114.8 kg)  03/17/16 256 lb 1.6 oz (116.2 kg)    Physical Exam  Constitutional: He is oriented to person, place, and time. He appears well-developed. No distress.  NAD  HENT:  Mouth/Throat: Oropharynx is clear and moist.  Eyes: Conjunctivae are normal. Pupils are equal, round, and reactive to light.  Neck: Normal range of motion. No JVD present. No thyromegaly present.  Cardiovascular: Normal rate, regular rhythm, normal heart sounds and intact distal pulses.  Exam reveals no gallop and no friction rub.   No murmur heard. Pulmonary/Chest: Effort normal and breath sounds normal. No respiratory distress. He has no wheezes. He has no rales. He exhibits no tenderness.  Abdominal: Soft. Bowel sounds are normal. He exhibits no distension and no mass. There is no tenderness. There is no rebound and no guarding.  Musculoskeletal: Normal range of motion. He exhibits tenderness. He exhibits no edema.  Lymphadenopathy:    He has no cervical adenopathy.  Neurological: He is alert and oriented to person, place, and time. He has normal reflexes. No cranial nerve deficit. He exhibits normal muscle tone. He displays a negative Romberg sign. Coordination and gait normal.  Skin: Skin is warm and dry. No rash noted.  Psychiatric: He has a normal mood and affect. His behavior is normal. Judgment and thought content normal.    Lab Results  Component Value Date   WBC 3.7 (L) 03/20/2016   HGB 8.2 (L) 03/20/2016   HCT 24.4 (L) 03/20/2016   PLT 156 03/20/2016   GLUCOSE 321 (H)  04/15/2016   CHOL 100 03/20/2015   TRIG (H) 03/20/2015    795.0 Triglyceride is over 400; calculations on Lipids are invalid.   HDL 22.30 (L) 03/20/2015   LDLDIRECT 26.0 03/20/2015   LDLCALC UNABLE TO CALCULATE IF TRIGLYCERIDE OVER 400 mg/dL 10/12/2014   ALT 15 03/20/2015   AST 18 03/20/2015   NA 134 (L) 04/15/2016   K 5.0 04/15/2016   CL 99 04/15/2016   CREATININE 1.56 (H) 04/15/2016   BUN 27 (H) 04/15/2016   CO2 28 04/15/2016   TSH 1.89 09/17/2015   PSA 0.50 11/07/2013   INR 1.62 03/17/2016   HGBA1C 8.9 (H) 03/17/2016   MICROALBUR 1.2 05/16/2013    Dg Chest 2 View  Result Date: 04/15/2016 CLINICAL DATA:  Followup pneumonia  EXAM: CHEST  2 VIEW COMPARISON:  03/17/2016 FINDINGS: Cardiac shadow is stable. The lungs are well aerated bilaterally. Patchy changes are noted along the pleural margin in the left lung base improved from the prior exam consistent with resolving pneumonia. No bony abnormality is seen. IMPRESSION: Improved infiltrate in the left base. Continued follow-up is recommended. Electronically Signed   By: Inez Catalina M.D.   On: 04/15/2016 08:49    Assessment & Plan:   There are no diagnoses linked to this encounter. I have discontinued Mr. Zalesky cefpodoxime and doxycycline. I am also having him maintain his acetaminophen, ferrous sulfate, ergocalciferol, ONE TOUCH ULTRA 2, onetouch ultrasoft, Cholecalciferol, HUMIRA PEN, amiodarone, fenofibrate, Blood Pressure Monitor, omeprazole, apixaban, metoprolol, furosemide, HYDROcodone-acetaminophen, diltiazem, allopurinol, atorvastatin, levothyroxine, Insulin Pen Needle, lisinopril, Insulin Glargine, and glucose blood.  No orders of the defined types were placed in this encounter.    Follow-up: No Follow-up on file.  [name], MD

## 2016-04-20 DIAGNOSIS — L409 Psoriasis, unspecified: Secondary | ICD-10-CM | POA: Diagnosis not present

## 2016-04-20 DIAGNOSIS — L4052 Psoriatic arthritis mutilans: Secondary | ICD-10-CM | POA: Diagnosis not present

## 2016-05-03 ENCOUNTER — Other Ambulatory Visit: Payer: Self-pay | Admitting: Internal Medicine

## 2016-05-08 ENCOUNTER — Ambulatory Visit (INDEPENDENT_AMBULATORY_CARE_PROVIDER_SITE_OTHER): Payer: Medicare Other | Admitting: Internal Medicine

## 2016-05-08 ENCOUNTER — Other Ambulatory Visit (INDEPENDENT_AMBULATORY_CARE_PROVIDER_SITE_OTHER): Payer: Medicare Other

## 2016-05-08 ENCOUNTER — Encounter: Payer: Self-pay | Admitting: Internal Medicine

## 2016-05-08 ENCOUNTER — Ambulatory Visit (INDEPENDENT_AMBULATORY_CARE_PROVIDER_SITE_OTHER)
Admission: RE | Admit: 2016-05-08 | Discharge: 2016-05-08 | Disposition: A | Discharge status: Home / Self Care | Payer: Medicare Other | Source: Ambulatory Visit | Attending: Internal Medicine | Admitting: Internal Medicine

## 2016-05-08 VITALS — BP 124/62 | HR 56 | Ht 71.0 in | Wt 256.4 lb

## 2016-05-08 DIAGNOSIS — J189 Pneumonia, unspecified organism: Secondary | ICD-10-CM | POA: Diagnosis not present

## 2016-05-08 DIAGNOSIS — I6523 Occlusion and stenosis of bilateral carotid arteries: Secondary | ICD-10-CM | POA: Diagnosis not present

## 2016-05-08 LAB — CBC WITH DIFFERENTIAL/PLATELET
BASOS ABS: 0 10*3/uL (ref 0.0–0.1)
Basophils Relative: 0.4 % (ref 0.0–3.0)
EOS ABS: 0.1 10*3/uL (ref 0.0–0.7)
Eosinophils Relative: 3.3 % (ref 0.0–5.0)
HCT: 34.3 % — ABNORMAL LOW (ref 39.0–52.0)
HEMOGLOBIN: 12.3 g/dL — AB (ref 13.0–17.0)
Lymphocytes Relative: 23.7 % (ref 12.0–46.0)
Lymphs Abs: 1 10*3/uL (ref 0.7–4.0)
MCHC: 35.9 g/dL (ref 30.0–36.0)
MCV: 88.9 fl (ref 78.0–100.0)
MONO ABS: 0.3 10*3/uL (ref 0.1–1.0)
Monocytes Relative: 6.8 % (ref 3.0–12.0)
Neutro Abs: 2.9 10*3/uL (ref 1.4–7.7)
Neutrophils Relative %: 65.8 % (ref 43.0–77.0)
Platelets: 192 10*3/uL (ref 150.0–400.0)
RBC: 3.86 Mil/uL — AB (ref 4.22–5.81)
RDW: 17 % — ABNORMAL HIGH (ref 11.5–15.5)
WBC: 4.4 10*3/uL (ref 4.0–10.5)

## 2016-05-08 LAB — BASIC METABOLIC PANEL
BUN: 25 mg/dL — ABNORMAL HIGH (ref 6–23)
CALCIUM: 9.3 mg/dL (ref 8.4–10.5)
CHLORIDE: 104 meq/L (ref 96–112)
CO2: 30 meq/L (ref 19–32)
Creatinine, Ser: 1.55 mg/dL — ABNORMAL HIGH (ref 0.40–1.50)
GFR: 46.87 mL/min — ABNORMAL LOW (ref >60.00)
Glucose, Bld: 220 mg/dL — ABNORMAL HIGH (ref 70–99)
Potassium: 4.8 mEq/L (ref 3.5–5.1)
SODIUM: 139 meq/L (ref 135–145)

## 2016-05-08 LAB — SEDIMENTATION RATE: SED RATE: 3 mm/h (ref 0–20)

## 2016-05-08 NOTE — Progress Notes (Signed)
Subjective:    Patient ID: Michael [name], male    DOB: 07-21-1941,    MRN: [id number]  HPI  55 yowm quit smoking 2007 with no evidence copd by pfts 01/08/15  with dx of outpt pna but completely recovered symptomatically then abruptly ill and admit with CAP presented with Left lower cp/ weakness  And  Temp 102> admit:    Admit date: 03/17/2016  Discharge date: 03/21/2016    Recommendations for Outpatient Follow-up:   Follow up with PCP in 1-2 weeks  PCP Please obtain BMP/CBC, 2 view CXR in 1week,  (see Discharge instructions)   PCP Please follow up on the following pending results: None   Home Health: None Equipment/Devices: None  Consultations: None Discharge Condition: Stable   CODE STATUS: Full   Diet Recommendation: Heart Healthy Low Carb      Chief Complaint  Patient presents with  . Chest Pain  . Cough  . Weakness     Brief history of present illness from the day of admission and additional interim summary    Michael [name] medical history significant of psoriasis on Humira, hypertension, hyperlipidemia, diabetes mellitus, GERD, hypothyroidism, gout, PAF on Eliquis, CAd, AVM of colon, CKD-III,who presents with shortness, cough, fever, chills, rash.   Patient states that he has been having chest pain, dry cough, SOB,fever, chills in the past 3 days. His chest pain is located in the left side of chest, constant, moderate, radiating to the back, sharp, pleuritic. It is aggravated by coughing and deep breath. No tenderness over calf areas. Patient denies nausea, vomiting, diarrhea, abdominal pain, symptoms of UTI or unilateral weakness. Pt notice somescatteredpimple rashes in legs and arms recently.  ED Course:pt was found to have WBC 10.8, temperature 102.6, no tachycardia, has tachypnea, oxygen saturation 93% on room air, lactate 1.90, troponin negative, was seen in renal function, sodium 131. Chest x-ray showed infiltration left  upper lobe.  Hospital issues addressed    1. CAP with Sepsis - Patient is immunocompromised as he is on Humira, seems to have responded well to empiric IV antibiotics which are Vancomycin, Zosyn and Doxy, clinically improved, sepsis physiology has resolved after IV fluids, lactate normalized and today he is completely symptom-free off oxygen, eager to go home, able to stand up and walk by himself does not want home health PT RN .Will transition antibiotics to oral Vantin and doxycycline for 5 more days with follow-up with PCP for repeat CBC, BMP and a 2 view chest x-ray in the next 3-5 days.  2. Paroxysmal atrial fibrillation [place] vasc 2 score of 3. Continue Eliquis, beta blocker, Cardizem and amiodarone combination. Stable blood pressure and rate.  3. Dyslipidemia. Continue statin.  4. Psoriasis. On Humira outpatient.  5. Hypothyroidism. Continue Synthroid.  6. Essential hypertension. Renew home regimen except hold lisinopril for another day due to recent ARF.  7. GERD. On PPI.  8. ARF on CKD 3. Baseline creatinine close to 1.5. Now at baseline after hydration.  9. Gout. On allopurinol.   10. Hyponatremia due to dehydration from sepsis. Proved with hydration and repeat BMP in 3-4 days by PCP.  11. Diarrhea on 03/19/2016. No further stools in the last 12 hours on 03/20/2016, stool studies suggest C. difficile colonization, diarrhea has completely resolved in the last 24 hours.   12. Anemia of chronic disease. Dilutional fall from IV fluids, outpatient monitor in one week. Outpatient workup by PCP as needed.   13. DM type II.  Resume home regimen follow with PCP.   Discharge diagnosis     Principal Problem:   CAP (community acquired pneumonia) Active Problems:   DM (diabetes mellitus) ([place])   Iron deficiency anemia due to chronic blood loss   HTN (hypertension), benign   Gout   Hypertriglyceridemia   Psoriasis   PAF (paroxysmal atrial fibrillation) (H[facility])    Hyperlipidemia   Hypothyroidism   Acute renal failure superimposed on stage 3 chronic kidney disease ([place])   Sepsis ([place])   Rash    Discharge instructions        Discharge Instructions    Discharge instructions    Complete by:  As directed   Follow with Primary Michael [name] Michael Kehr, Michael [name] in 2-3 days   Get CBC, CMP, 2 view Chest X ray checked  by Primary Michael [name] or SNF Michael [name] in 5-7 days ( we routinely change or add medications that can affect your baseline labs and fluid status, therefore we recommend that you get the mentioned basic workup next visit with your PCP, your PCP may decide not to get them or add new tests based on their clinical decision)    05/08/2016 1st [facility] office visit/ Michael [name]   Chief Complaint  Patient presents with  . Advice Only    self referral for abn cxr after being treated for PNA>  denies any breathing complaints at this time.      Took last dose of Humira one week p discharge and then dermatologist rec hold further dosing   L cp was never really pleuritic and never really coughed up purulent sputum but L post cp and fever resolved on abx   Not limited by breathing from desired activities    No obvious day to day or daytime variability or assoc excess/ purulent sputum or mucus plugs or hemoptysis or cp or chest tightness, subjective wheeze or overt sinus or hb symptoms. No unusual exp hx or h/o childhood pna/ asthma or knowledge of premature birth.  Sleeping ok without nocturnal  or early am exacerbation  of respiratory  c/o's or need for noct saba. Also denies any obvious fluctuation of symptoms with weather or environmental changes or other aggravating or alleviating factors except as outlined above   Current Medications, Allergies, Complete Past Medical History, Past Surgical History, Family History, and Social History were reviewed in [employer] record.              Review of Systems  Constitutional: Negative for  fever and unexpected weight change.  HENT: Negative for congestion, dental problem, ear pain, nosebleeds, postnasal drip, rhinorrhea, sinus pressure, sneezing, sore throat and trouble swallowing.   Eyes: Negative for redness and itching.  Respiratory: Negative for cough, chest tightness, shortness of breath and wheezing.   Cardiovascular: Negative for palpitations and leg swelling.  Gastrointestinal: Negative for nausea and vomiting.  Genitourinary: Negative for dysuria.  Musculoskeletal: Negative for joint swelling.  Skin: Negative for rash.  Neurological: Negative for headaches.  Hematological: Does not bruise/bleed easily.  Psychiatric/Behavioral: Negative for dysphoric mood. The patient is not nervous/anxious.        Objective:   Physical Exam amb wm nad    Wt Readings from Last 3 Encounters:  05/08/16 256 lb 6.4 oz (116.3 kg)  04/17/16 255 lb (115.7 kg)  03/24/16 253 lb (114.8 kg)    Vital signs reviewed  - Note on arrival 02 sats  98% on RA          HEENT:  nl   turbinates, and oropharynx. Nl external ear canals without cough reflex- edentulous   NECK :  without JVD/Nodes/TM/ nl carotid upstrokes bilaterally   LUNGS: no acc muscle use,  Nl contour chest which is clear to A and P bilaterally without cough on insp or exp maneuvers   CV:  RRR  no s3 or murmur or increase in P2, no edema   ABD:  soft and nontender with nl inspiratory excursion in the supine position. No bruits or organomegaly, bowel sounds nl  MS:  Nl gait/ ext warm without deformities, calf tenderness, cyanosis or clubbing No obvious joint restrictions   SKIN: warm and dry without lesions    NEURO:  alert, approp, nl sensorium with  no motor deficits      CXR PA and Lateral:   05/08/2016 :    I personally reviewed images and agree with radiology impression as follows:    Slight further interval clearing of the peripheral infiltrate in the left lower lung laterally. Stable changes elsewhere.  Given the patient's clinical improvement, an additional follow-up chest x-ray in 3-4 weeks is recommended. If the patient's symptoms worsen in the mean time, chest CT scanning would likely be the most useful next imaging step.  Stable cardiomegaly.  Aortic atherosclerosis.     Labs ordered/ reviewed:    Chemistry      Component Value Date/Time   NA 139 05/08/2016 1615   NA 137 07/26/2014 1239   K 4.8 05/08/2016 1615   K 5.1 07/26/2014 1239   CL 104 05/08/2016 1615   CL 104 01/12/2013 1053   CO2 30 05/08/2016 1615   CO2 26 07/26/2014 1239   BUN 25 (H) 05/08/2016 1615   BUN 17.1 07/26/2014 1239   CREATININE 1.55 (H) 05/08/2016 1615   CREATININE 1.1 07/26/2014 1239   GLU 263 10/09/2013      Component Value Date/Time   CALCIUM 9.3 05/08/2016 1615   CALCIUM 9.2 07/26/2014 1239   ALKPHOS 42 03/20/2015 1003   ALKPHOS 77 07/26/2014 1239   AST 18 03/20/2015 1003   AST 13 07/26/2014 1239   ALT 15 03/20/2015 1003   ALT 13 07/26/2014 1239   BILITOT 0.5 03/20/2015 1003   BILITOT 0.67 07/26/2014 1239        Lab Results  Component Value Date   WBC 4.4 05/08/2016   HGB 12.3 (L) 05/08/2016   HCT 34.3 (L) 05/08/2016   MCV 88.9 05/08/2016   PLT 192.0 05/08/2016     Lab Results  Component Value Date   DDIMER <0.19 05/08/2016      Lab Results  Component Value Date   TSH 1.89 09/17/2015          Lab Results  Component Value Date   ESRSEDRATE 3 05/08/2016   ESRSEDRATE 22 11/07/2013            Assessment & Plan:

## 2016-05-08 NOTE — Patient Instructions (Signed)
Please remember to go to the lab and x-ray department downstairs for your tests - we will call you with the results when they are available.    Definitely leave off humira for as long as you can unless the arthritis/ psoriasis get a lot worse than now  Please schedule a follow up office visit in 4 weeks, sooner if needed

## 2016-05-09 LAB — D-DIMER, QUANTITATIVE: D-Dimer, Quant: <0.19 mcg/mL FEU (ref <0.50)

## 2016-05-09 NOTE — Assessment & Plan Note (Signed)
Body mass index is 35.76  Lab Results  Component Value Date   TSH 1.89 09/17/2015     Contributing to gerd tendency/ doe/reviewed the need and the process to achieve and maintain neg calorie balance > defer f/u primary care including intermittently monitoring thyroid status

## 2016-05-09 NOTE — Assessment & Plan Note (Signed)
03/17/16 Probably  Lingular > resolved 90% radiographically 05/08/2016  - ESR 05/08/2016 = 3    No evidence of boop or a late parapneumonic process here > most likely the use of Humira interupted the natural hx of CAP but should gradually resolved 100% and be able to restart the Humira then   Total time devoted to counseling  = 35/65mreview case with pt including hospital records/  discussion of options/alternatives/ personally creating written instructions  in presence of pt  then going over those specific  Instructions directly with the pt including how to use all of the meds but in particular covering each new medication in detail and the difference between the maintenance/automatic meds and the prns using an action plan format for the latter.

## 2016-05-11 ENCOUNTER — Encounter (HOSPITAL_COMMUNITY): Payer: Self-pay | Admitting: Emergency Medicine

## 2016-05-11 ENCOUNTER — Observation Stay (HOSPITAL_COMMUNITY)
Admission: EM | Admit: 2016-05-11 | Discharge: 2016-05-13 | Disposition: A | Discharge status: Home / Self Care | Payer: Medicare Other | Attending: Internal Medicine | Admitting: Internal Medicine

## 2016-05-11 DIAGNOSIS — R109 Unspecified abdominal pain: Secondary | ICD-10-CM | POA: Diagnosis not present

## 2016-05-11 DIAGNOSIS — K922 Gastrointestinal hemorrhage, unspecified: Secondary | ICD-10-CM | POA: Diagnosis present

## 2016-05-11 DIAGNOSIS — Z833 Family history of diabetes mellitus: Secondary | ICD-10-CM | POA: Diagnosis not present

## 2016-05-11 DIAGNOSIS — E1151 Type 2 diabetes mellitus with diabetic peripheral angiopathy without gangrene: Secondary | ICD-10-CM | POA: Diagnosis present

## 2016-05-11 DIAGNOSIS — I5022 Chronic systolic (congestive) heart failure: Secondary | ICD-10-CM | POA: Insufficient documentation

## 2016-05-11 DIAGNOSIS — Z885 Allergy status to narcotic agent status: Secondary | ICD-10-CM | POA: Diagnosis not present

## 2016-05-11 DIAGNOSIS — K635 Polyp of colon: Secondary | ICD-10-CM | POA: Diagnosis not present

## 2016-05-11 DIAGNOSIS — E039 Hypothyroidism, unspecified: Secondary | ICD-10-CM | POA: Diagnosis not present

## 2016-05-11 DIAGNOSIS — M1990 Unspecified osteoarthritis, unspecified site: Secondary | ICD-10-CM | POA: Insufficient documentation

## 2016-05-11 DIAGNOSIS — I481 Persistent atrial fibrillation: Secondary | ICD-10-CM | POA: Diagnosis not present

## 2016-05-11 DIAGNOSIS — Z888 Allergy status to other drugs, medicaments and biological substances status: Secondary | ICD-10-CM | POA: Diagnosis not present

## 2016-05-11 DIAGNOSIS — K648 Other hemorrhoids: Secondary | ICD-10-CM | POA: Insufficient documentation

## 2016-05-11 DIAGNOSIS — A0472 Enterocolitis due to Clostridium difficile, not specified as recurrent: Secondary | ICD-10-CM | POA: Diagnosis not present

## 2016-05-11 DIAGNOSIS — D62 Acute posthemorrhagic anemia: Secondary | ICD-10-CM | POA: Insufficient documentation

## 2016-05-11 DIAGNOSIS — K219 Gastro-esophageal reflux disease without esophagitis: Secondary | ICD-10-CM | POA: Insufficient documentation

## 2016-05-11 DIAGNOSIS — I480 Paroxysmal atrial fibrillation: Secondary | ICD-10-CM | POA: Insufficient documentation

## 2016-05-11 DIAGNOSIS — E785 Hyperlipidemia, unspecified: Secondary | ICD-10-CM | POA: Diagnosis not present

## 2016-05-11 DIAGNOSIS — I10 Essential (primary) hypertension: Secondary | ICD-10-CM | POA: Diagnosis present

## 2016-05-11 DIAGNOSIS — Z86010 Personal history of colonic polyps: Secondary | ICD-10-CM | POA: Diagnosis not present

## 2016-05-11 DIAGNOSIS — K2970 Gastritis, unspecified, without bleeding: Secondary | ICD-10-CM | POA: Diagnosis not present

## 2016-05-11 DIAGNOSIS — K2971 Gastritis, unspecified, with bleeding: Secondary | ICD-10-CM

## 2016-05-11 DIAGNOSIS — Z825 Family history of asthma and other chronic lower respiratory diseases: Secondary | ICD-10-CM | POA: Insufficient documentation

## 2016-05-11 DIAGNOSIS — Z7901 Long term (current) use of anticoagulants: Secondary | ICD-10-CM | POA: Insufficient documentation

## 2016-05-11 DIAGNOSIS — Z87891 Personal history of nicotine dependence: Secondary | ICD-10-CM | POA: Insufficient documentation

## 2016-05-11 DIAGNOSIS — M109 Gout, unspecified: Secondary | ICD-10-CM | POA: Insufficient documentation

## 2016-05-11 DIAGNOSIS — Z800 Family history of malignant neoplasm of digestive organs: Secondary | ICD-10-CM | POA: Insufficient documentation

## 2016-05-11 DIAGNOSIS — I482 Chronic atrial fibrillation: Secondary | ICD-10-CM | POA: Insufficient documentation

## 2016-05-11 DIAGNOSIS — E1122 Type 2 diabetes mellitus with diabetic chronic kidney disease: Secondary | ICD-10-CM | POA: Insufficient documentation

## 2016-05-11 DIAGNOSIS — E1165 Type 2 diabetes mellitus with hyperglycemia: Secondary | ICD-10-CM | POA: Insufficient documentation

## 2016-05-11 DIAGNOSIS — Z794 Long term (current) use of insulin: Secondary | ICD-10-CM | POA: Insufficient documentation

## 2016-05-11 DIAGNOSIS — I2510 Atherosclerotic heart disease of native coronary artery without angina pectoris: Secondary | ICD-10-CM | POA: Insufficient documentation

## 2016-05-11 DIAGNOSIS — I130 Hypertensive heart and chronic kidney disease with heart failure and stage 1 through stage 4 chronic kidney disease, or unspecified chronic kidney disease: Secondary | ICD-10-CM | POA: Insufficient documentation

## 2016-05-11 DIAGNOSIS — K644 Residual hemorrhoidal skin tags: Principal | ICD-10-CM | POA: Diagnosis present

## 2016-05-11 DIAGNOSIS — N183 Chronic kidney disease, stage 3 (moderate): Secondary | ICD-10-CM | POA: Diagnosis not present

## 2016-05-11 DIAGNOSIS — Z8249 Family history of ischemic heart disease and other diseases of the circulatory system: Secondary | ICD-10-CM | POA: Insufficient documentation

## 2016-05-11 DIAGNOSIS — Z823 Family history of stroke: Secondary | ICD-10-CM | POA: Insufficient documentation

## 2016-05-11 DIAGNOSIS — I4819 Other persistent atrial fibrillation: Secondary | ICD-10-CM | POA: Diagnosis present

## 2016-05-11 LAB — URINALYSIS, ROUTINE W REFLEX MICROSCOPIC
BILIRUBIN URINE: NEGATIVE
GLUCOSE, UA: NEGATIVE mg/dL
HGB URINE DIPSTICK: NEGATIVE
Ketones, ur: NEGATIVE mg/dL
Leukocytes, UA: NEGATIVE
Nitrite: NEGATIVE
PROTEIN: NEGATIVE mg/dL
Specific Gravity, Urine: 1.021 (ref 1.005–1.030)
pH: 5.5 (ref 5.0–8.0)

## 2016-05-11 LAB — CBC
HCT: 32.2 % — ABNORMAL LOW (ref 39.0–52.0)
Hemoglobin: 11.3 g/dL — ABNORMAL LOW (ref 13.0–17.0)
MCH: 30.6 pg (ref 26.0–34.0)
MCHC: 35.1 g/dL (ref 30.0–36.0)
MCV: 87.3 fL (ref 78.0–100.0)
PLATELETS: 156 10*3/uL (ref 150–400)
RBC: 3.69 MIL/uL — ABNORMAL LOW (ref 4.22–5.81)
RDW: 15.4 % (ref 11.5–15.5)
WBC: 4.6 10*3/uL (ref 4.0–10.5)

## 2016-05-11 LAB — COMPREHENSIVE METABOLIC PANEL
ALBUMIN: 4.1 g/dL (ref 3.5–5.0)
ALK PHOS: 36 U/L — AB (ref 38–126)
ALT: 25 U/L (ref 17–63)
AST: 27 U/L (ref 15–41)
Anion gap: 8 (ref 5–15)
BUN: 27 mg/dL — AB (ref 6–20)
CALCIUM: 8.8 mg/dL — AB (ref 8.9–10.3)
CO2: 24 mmol/L (ref 22–32)
CREATININE: 1.52 mg/dL — AB (ref 0.61–1.24)
Chloride: 107 mmol/L (ref 101–111)
GFR calc non Af Amer: 44 mL/min — ABNORMAL LOW (ref >60)
GFR, EST AFRICAN AMERICAN: 51 mL/min — AB (ref >60)
GLUCOSE: 255 mg/dL — AB (ref 65–99)
Potassium: 4.1 mmol/L (ref 3.5–5.1)
SODIUM: 139 mmol/L (ref 135–145)
Total Bilirubin: 0.5 mg/dL (ref 0.3–1.2)
Total Protein: 6.8 g/dL (ref 6.5–8.1)

## 2016-05-11 LAB — LIPASE, BLOOD: Lipase: 30 U/L (ref 11–51)

## 2016-05-11 NOTE — Progress Notes (Signed)
lmtcb

## 2016-05-11 NOTE — ED Triage Notes (Signed)
Pt reports blood in bowl movements since September. Today symptoms are worse. Also with abdominal pain. Denies N/V. NAT at triage VSS.

## 2016-05-11 NOTE — ED Provider Notes (Addendum)
[facility] DEPT Provider Note   CSN: [id number] Arrival date & time: 05/11/16  1457     History   Chief Complaint Chief Complaint  Patient presents with  . Blood In Stools    HPI Michael [name] is a 75 y.o. male.  The history is provided by the patient.  CC: bloody stool  Onset/Duration: several weeks Timing: intermittent Quality: bright red and marroon Severity: mild initially, moderate today Modifying Factors:  Improved by: nothing   Worsened by: diarrhea Associated Signs/Symptoms:  Pertinent (+): intermittent abd cramping, melena  Pertinent (-): fevers, chills, n/v, Context: h/o AVM in the small bowel. Admitted in Aug for CAP, noted to have C. Diff during admission. Developed hemorrhoids due to diarrhea.   Past Medical History:  Diagnosis Date  . Allergy   . Arthritis   . AVM (arteriovenous malformation) of colon   . CAD (coronary artery disease)    a. Cath 10/2014 - Mild to moderate diagonal, circumflex and obtuse marginal disease. Innominate artery sternosis.  . Carotid artery disease ([place])    a. Carotid dopple 03/2014 56-43% RICA &  32-95% LICA stenosis b. 1/88  . CKD (chronic kidney disease), stage III   . Dysrhythmia    ATRIAL FIBRILATION  . Elevated troponin    a. 09/2014 in setting of AF RVR-->Myoview: EF 49%, no ischemia/infarct->Med Rx.  . GERD (gastroesophageal reflux disease)   . Gout   . H/O transfusion of whole blood   . History of PFTs    PFTs 6/16:  FVC 3.73 (87%), FEV1 2.93 (88%), FEV1/FVC 78%, DLCO 69%  . Hyperlipidemia   . Hypertension   . Hypothyroidism   . Iron deficiency anemia   . PAF (paroxysmal atrial fibrillation) ([place])    a. 09/2014: Converted on Dilt;  b. CHA2DS2VASc = 3-->eliquis;  c. 09/2014 Echo: EF 55-60%, mild LVH, mildly dil LA.  [name] Personal history of colonic polyps 2007, 2008   adenoma each time, largest 12 mm in 2007  . Psoriasis   . Type II diabetes mellitus ([place])    TYPE 2    Patient Active Problem List   Diagnosis Date Noted  . Pustular rash 03/17/2016  . Hypothyroidism 03/17/2016  . Acute renal failure superimposed on stage 3 chronic kidney disease ([place]) 03/17/2016  . CAP (community acquired pneumonia) 03/17/2016  . Sepsis ([place]) 03/17/2016  . Rash 03/17/2016  . Noncompliance with diet and medication regimen 12/16/2015  . LBP (low back pain) 03/08/2015  . Frequency-urgency syndrome 03/08/2015  . Subclavian artery stenosis, right ([place]) 01/16/2015  . Cholelithiasis 01/16/2015  . Restless leg syndrome 01/10/2015  . Postherpetic neuralgia 12/18/2014  . Herpes zoster 11/28/2014  . Leg pain, left 11/28/2014  . Demand ischemia ([place])   . PAF (paroxysmal atrial fibrillation) ([place])   . Hyperlipidemia   . Hypertension   . Elevated troponin   . Poorly controlled diabetes mellitus ([place]) 10/11/2014  . Pain in joint, shoulder region 07/10/2014  . Psoriasis 03/19/2014  . Gangrenous appendicitis 02/10/2014  . RLQ abdominal pain 02/09/2014  . Diarrhea 02/09/2014  . Appendicitis 02/09/2014  . Unspecified vitamin D deficiency 11/13/2013  . Carotid stenosis 11/06/2013  . Headache(784.0) 11/06/2013  . Psoriatic arthritis ([place]) 11/06/2013  . Hypertriglyceridemia 05/16/2013  . HTN (hypertension), benign 09/10/2012  . Morbid obesity due to excess calories ([place]) 09/10/2012  . Gout 09/10/2012  . Angiodysplasia of intestinal tract 01/20/2012  . Iron deficiency anemia due to chronic blood loss 01/20/2012  . Anemia 12/22/2011  . DM (diabetes  mellitus), type 2 with peripheral vascular complications ([place]) 62/56/3893  . Thyroid disease 12/22/2011    Past Surgical History:  Procedure Laterality Date  . APPENDECTOMY  02/09/2014  . COLONOSCOPY  02/10/2011   internal hemorrhoids  . COLONOSCOPY W/ POLYPECTOMY  04/09/2006   12 mm adenoma  . COLONOSCOPY W/ POLYPECTOMY  06/17/2007   5 mm adenoma  . ESOPHAGOGASTRODUODENOSCOPY  12/23/2011   Procedure: ESOPHAGOGASTRODUODENOSCOPY (EGD);  Surgeon: Irene Shipper,  MD;  Location: [facility];  Service: Endoscopy;  Laterality: N/A;  with small bowel bx's  . GIVENS CAPSULE STUDY  12/28/2011  . KNEE ARTHROSCOPY     right  . LAPAROSCOPIC APPENDECTOMY N/A 02/09/2014   Procedure: APPENDECTOMY LAPAROSCOPIC;  Surgeon: Leighton Ruff, MD;  Location: [facility];  Service: General;  Laterality: N/A;  . LEFT HEART CATHETERIZATION WITH CORONARY ANGIOGRAM N/A 11/15/2014   Procedure: LEFT HEART CATHETERIZATION WITH CORONARY ANGIOGRAM;  Surgeon: Belva Crome, MD;  Location: [facility] CATH LAB;  Service: Cardiovascular;  Laterality: N/A;  . ROTATOR CUFF REPAIR     left       Home Medications    Prior to Admission medications   Medication Sig Start Date End Date Taking? Authorizing Provider  acetaminophen (TYLENOL) 650 MG CR tablet Take 1,300 mg by mouth every 8 (eight) hours as needed for pain.    Yes Historical Provider, MD  allopurinol (ZYLOPRIM) 100 MG tablet Take 2 tablets (200 mg total) by mouth daily. 02/03/16  Yes Evie Lacks [name], MD  amiodarone (PACERONE) 200 MG tablet Take 1 tablet (200 mg total) by mouth daily. 06/25/15  Yes Wellington Hampshire, MD  apixaban (ELIQUIS) 5 MG TABS tablet Take 1 tablet (5 mg total) by mouth 2 (two) times daily. 11/07/15  Yes Wellington Hampshire, MD  atorvastatin (LIPITOR) 40 MG tablet Take 1 tablet (40 mg total) by mouth daily. 03/09/16  Yes Josue Hector, MD  Cholecalciferol 1000 UNITS TBDP Take 1,000-5,000 Units by mouth daily. Takes 1000 units everyday except for on Saturday patient takes 5000 units   Yes Historical Provider, MD  diltiazem (CARDIZEM CD) 180 MG 24 hr capsule Take 1 capsule (180 mg total) by mouth daily. 12/16/15  Yes Wellington Hampshire, MD  fenofibrate 160 MG tablet TAKE 1 TABLET(160 MG) BY MOUTH DAILY 08/09/15  Yes Darlin Coco, MD  ferrous sulfate 325 (65 FE) MG tablet Take 325 mg by mouth 3 (three) times daily with meals.   Yes Historical Provider, MD  furosemide (LASIX) 20 MG tablet Take 1 tablet (20 mg total) by mouth  daily. 12/16/15  Yes Evie Lacks [name], MD  glipiZIDE (GLUCOTROL) 10 MG tablet Take 10 mg by mouth daily. 05/06/16  Yes Historical Provider, MD  HUMIRA PEN 40 MG/0.8ML PNKT Inject 40 mg into the skin every 14 (fourteen) days. 06/18/15  Yes Historical Provider, MD  HYDROcodone-acetaminophen (NORCO/VICODIN) 5-325 MG tablet Take 1 tablet by mouth 2 (two) times daily as needed for moderate pain or severe pain. 12/16/15  Yes Evie Lacks [name], MD  Insulin Glargine (TOUJEO SOLOSTAR) 300 UNIT/ML SOPN Inject 50 Units into the skin daily. Titrate up by 1 unit every 1-2 days for goal sugars of 120-150 04/17/16 04/17/20 Yes Aleksei V [name], MD  levothyroxine (SYNTHROID, LEVOTHROID) 150 MCG tablet TAKE 1 TABLET BY MOUTH DAILY BEFORE BREAKFAST 03/16/16  Yes Evie Lacks [name], MD  lisinopril (PRINIVIL,ZESTRIL) 20 MG tablet Take 1 tablet (20 mg total) by mouth every morning. 03/23/16  Yes Thurnell Lose, MD  metoprolol Tonia [place])  50 MG tablet TAKE 1 TABLET(50 MG) BY MOUTH TWICE DAILY 11/15/15  Yes Wellington Hampshire, MD  omeprazole (PRILOSEC) 40 MG capsule TAKE 1 CAPSULE BY MOUTH EVERY DAY 05/04/16  Yes Evie Lacks [name], MD  Blood Glucose Monitoring Suppl (ONE TOUCH ULTRA 2) W/DEVICE KIT Use as directed Dx E11.9 05/18/14   Cassandria Anger, MD  Blood Pressure Monitor KIT Use to check blood pressure daily Dx I10 10/04/15   Cassandria Anger, MD  ergocalciferol (VITAMIN D2) 50000 UNITS capsule Take 1 capsule (50,000 Units total) by mouth once a week. Patient not taking: Reported on 05/11/2016 11/08/13   Evie Lacks [name], MD  glucose blood (ONE TOUCH ULTRA TEST) test strip 1 each by Other route 4 (four) times daily. Use to check blood sugar four times a day  Dx: E11.9- insulin Dependant 04/06/16   Evie Lacks [name], MD  Insulin Pen Needle 31G X 5 MM MISC 1 Units by Does not apply route 4 (four) times daily. 03/17/16   Evie Lacks [name], MD  Lancets Glory Rosebush ULTRASOFT) lancets Use to check blood sugars  daily 05/18/14   Cassandria Anger, MD    Family History Family History  Problem Relation Age of Onset  . Heart attack Father   . Lung cancer Father   . Diabetes Father   . Stroke Father   . Hypertension Father   . Stroke Mother   . Hypertension Mother   . Cancer Brother     liver  . Heart disease Brother     chf  . COPD Sister   . Malignant hyperthermia Neg Hx     Social History Social History  Substance Use Topics  . Smoking status: Former Smoker    Packs/day: 0.70    Years: 48.00    Types: Cigarettes    Quit date: 03/29/2006  . Smokeless tobacco: Never Used  . Alcohol use 0.6 oz/week    1 Cans of beer per week     Comment: occasional alcohol intake     Allergies   Percocet [oxycodone-acetaminophen]; Invokana [canagliflozin]; and Metformin and related   Review of Systems Review of Systems  Constitutional: Negative for chills and fever.  HENT: Negative for ear pain and sore throat.   Eyes: Negative for pain and visual disturbance.  Respiratory: Negative for cough and shortness of breath.   Cardiovascular: Negative for chest pain and palpitations.  Gastrointestinal: Positive for abdominal pain, blood in stool and diarrhea. Negative for nausea and vomiting.  Genitourinary: Negative for dysuria and hematuria.  Musculoskeletal: Negative for arthralgias and back pain.  Skin: Negative for color change and rash.  Neurological: Negative for seizures and syncope.  All other systems reviewed and are negative.    Physical Exam Updated Vital Signs BP 169/64   Pulse (!) 56   Temp 97.8 F (36.6 C) (Oral)   Resp 19   Ht 5' 11"  (1.803 m)   Wt 256 lb 8 oz (116.3 kg)   SpO2 99%   BMI 35.77 kg/m    Physical Exam  Constitutional: He is oriented to person, place, and time. He appears well-developed and well-nourished. No distress.  HENT:  Head: Normocephalic and atraumatic.  Nose: Nose normal.  Eyes: Conjunctivae and EOM are normal. Pupils are equal, round, and  reactive to light. Right eye exhibits no discharge. Left eye exhibits no discharge. No scleral icterus.  Neck: Normal range of motion. Neck supple.  Cardiovascular: Normal rate and regular rhythm.  Exam reveals no gallop and  no friction rub.   No murmur heard. Pulmonary/Chest: Effort normal and breath sounds normal. No stridor. No respiratory distress. He has no rales.  Abdominal: Soft. He exhibits no distension. There is tenderness in the suprapubic area and left lower quadrant. There is no rigidity, no rebound, no guarding and negative Murphy's sign.  Musculoskeletal: He exhibits no edema or tenderness.  Neurological: He is alert and oriented to person, place, and time.  Skin: Skin is warm and dry. No rash noted. He is not diaphoretic. No erythema.  Psychiatric: He has a normal mood and affect.  Vitals reviewed.    ED Treatments / Results  Labs (all labs ordered are listed, but only abnormal results are displayed) Labs Reviewed  COMPREHENSIVE METABOLIC PANEL - Abnormal; Notable for the following:       Result Value   Glucose, Bld 255 (*)    BUN 27 (*)    Creatinine, Ser 1.52 (*)    Calcium 8.8 (*)    Alkaline Phosphatase 36 (*)    GFR calc non Af Amer 44 (*)    GFR calc Af Amer 51 (*)    All other components within normal limits  CBC - Abnormal; Notable for the following:    RBC 3.69 (*)    Hemoglobin 11.3 (*)    HCT 32.2 (*)    All other components within normal limits  LIPASE, BLOOD  URINALYSIS, ROUTINE W REFLEX MICROSCOPIC (NOT AT [facility])    EKG  EKG Interpretation None       Radiology Ct Abdomen Pelvis Wo Contrast   Procedures Procedures (including critical care time)  Medications Ordered in ED Medications  iopamidol (ISOVUE-300) 61 % injection 30 mL (30 mLs Oral Contrast Given 05/12/16 0015)  apixaban (ELIQUIS) tablet 5 mg (5 mg Oral Given 05/12/16 0152)  metoprolol tartrate (LOPRESSOR) tablet 50 mg (50 mg Oral Given 05/12/16 0152)     Initial  Impression / Assessment and Plan / ED Course  I have reviewed the triage vital signs and the nursing notes.  Pertinent labs & imaging results that were available during my care of the patient were reviewed by me and considered in my medical decision making (see chart for details).  Clinical Course    75 year old male with a history of A. fib on Eliquis, as well as small assessment AVMs, and hemorrhoids presents to the ED with 1 day of worsening bloody stools. History most consistent with lower GI bleed given the lack of melena. Hemoglobin stable at 11.3. Abdomen tender in left lower quadrant. We'll obtain CT scan to rule out infectious process suspicion given positive C. difficile on most recent admission.  Patient already has gastroenterologist with whom he can follow up with closely. Patient is currently well-appearing with no evidence of toxicity. If his CT scan is  And no further bloody BM, the patient will be cleared for discharge with strict return precautions. Close GI follow-up for continued workup and management was recommended.  Patient care turned over to Dr Stark Jock at 0200. Patient case and results discussed in detail; please see their note for further ED managment.       Fatima Blank, MD 05/12/16 1228

## 2016-05-12 ENCOUNTER — Encounter (HOSPITAL_COMMUNITY): Payer: Self-pay | Admitting: Internal Medicine

## 2016-05-12 ENCOUNTER — Emergency Department (HOSPITAL_COMMUNITY): Payer: Medicare Other

## 2016-05-12 DIAGNOSIS — K922 Gastrointestinal hemorrhage, unspecified: Secondary | ICD-10-CM

## 2016-05-12 DIAGNOSIS — I10 Essential (primary) hypertension: Secondary | ICD-10-CM | POA: Diagnosis not present

## 2016-05-12 DIAGNOSIS — I480 Paroxysmal atrial fibrillation: Secondary | ICD-10-CM

## 2016-05-12 DIAGNOSIS — K644 Residual hemorrhoidal skin tags: Secondary | ICD-10-CM | POA: Diagnosis not present

## 2016-05-12 DIAGNOSIS — R109 Unspecified abdominal pain: Secondary | ICD-10-CM | POA: Diagnosis not present

## 2016-05-12 LAB — GLUCOSE, CAPILLARY
GLUCOSE-CAPILLARY: 198 mg/dL — AB (ref 65–99)
GLUCOSE-CAPILLARY: 175 mg/dL — AB (ref 65–99)
Glucose-Capillary: 143 mg/dL — ABNORMAL HIGH (ref 65–99)
Glucose-Capillary: 163 mg/dL — ABNORMAL HIGH (ref 65–99)
Glucose-Capillary: 204 mg/dL — ABNORMAL HIGH (ref 65–99)

## 2016-05-12 LAB — COMPREHENSIVE METABOLIC PANEL
ALT: 21 U/L (ref 17–63)
ANION GAP: 7 (ref 5–15)
AST: 26 U/L (ref 15–41)
Albumin: 4 g/dL (ref 3.5–5.0)
Alkaline Phosphatase: 31 U/L — ABNORMAL LOW (ref 38–126)
BUN: 25 mg/dL — ABNORMAL HIGH (ref 6–20)
CHLORIDE: 104 mmol/L (ref 101–111)
CO2: 27 mmol/L (ref 22–32)
Calcium: 8.7 mg/dL — ABNORMAL LOW (ref 8.9–10.3)
Creatinine, Ser: 1.41 mg/dL — ABNORMAL HIGH (ref 0.61–1.24)
GFR, EST AFRICAN AMERICAN: 56 mL/min — AB (ref >60)
GFR, EST NON AFRICAN AMERICAN: 48 mL/min — AB (ref >60)
Glucose, Bld: 173 mg/dL — ABNORMAL HIGH (ref 65–99)
POTASSIUM: 3.6 mmol/L (ref 3.5–5.1)
Sodium: 138 mmol/L (ref 135–145)
TOTAL PROTEIN: 6.9 g/dL (ref 6.5–8.1)
Total Bilirubin: 0.9 mg/dL (ref 0.3–1.2)

## 2016-05-12 LAB — CBC
HCT: 32 % — ABNORMAL LOW (ref 39.0–52.0)
HCT: 29.8 % — ABNORMAL LOW (ref 39.0–52.0)
HEMOGLOBIN: 10.8 g/dL — AB (ref 13.0–17.0)
Hemoglobin: 10.2 g/dL — ABNORMAL LOW (ref 13.0–17.0)
MCH: 30.3 pg (ref 26.0–34.0)
MCH: 29.8 pg (ref 26.0–34.0)
MCHC: 33.8 g/dL (ref 30.0–36.0)
MCHC: 34.2 g/dL (ref 30.0–36.0)
MCV: 89.6 fL (ref 78.0–100.0)
MCV: 87.1 fL (ref 78.0–100.0)
PLATELETS: 116 10*3/uL — AB (ref 150–400)
Platelets: 124 10*3/uL — ABNORMAL LOW (ref 150–400)
RBC: 3.57 MIL/uL — ABNORMAL LOW (ref 4.22–5.81)
RBC: 3.42 MIL/uL — ABNORMAL LOW (ref 4.22–5.81)
RDW: 15.6 % — ABNORMAL HIGH (ref 11.5–15.5)
RDW: 15.5 % (ref 11.5–15.5)
WBC: 3.8 10*3/uL — ABNORMAL LOW (ref 4.0–10.5)
WBC: 3.1 10*3/uL — AB (ref 4.0–10.5)

## 2016-05-12 LAB — LACTIC ACID, PLASMA: LACTIC ACID, VENOUS: 1.1 mmol/L (ref 0.5–1.9)

## 2016-05-12 LAB — C DIFFICILE QUICK SCREEN W PCR REFLEX
C DIFFICILE (CDIFF) TOXIN: NEGATIVE
C Diff antigen: POSITIVE — AB

## 2016-05-12 LAB — HEMOGLOBIN AND HEMATOCRIT, BLOOD
HCT: 33.7 % — ABNORMAL LOW (ref 39.0–52.0)
HEMOGLOBIN: 11.5 g/dL — AB (ref 13.0–17.0)

## 2016-05-12 MED ORDER — ONDANSETRON HCL 4 MG PO TABS: 4.0000 mg | ORAL_TABLET | Freq: Four times a day (QID) | ORAL | Status: DC | PRN

## 2016-05-12 MED ORDER — ONDANSETRON HCL 4 MG/2ML IJ SOLN: 4.0000 mg | Freq: Four times a day (QID) | INTRAMUSCULAR | Status: DC | PRN

## 2016-05-12 MED ORDER — PEG-KCL-NACL-NASULF-NA ASC-C 100 G PO SOLR
0.5000 | Freq: Once | ORAL | Status: AC
  Administered 2016-05-12: 100 g via ORAL
  Filled 2016-05-12: qty 1

## 2016-05-12 MED ORDER — SODIUM CHLORIDE 0.9 % IV SOLN
INTRAVENOUS | Status: DC
  Administered 2016-05-12: 20:00:00 via INTRAVENOUS

## 2016-05-12 MED ORDER — AMIODARONE HCL 200 MG PO TABS: 200.0000 mg | ORAL_TABLET | Freq: Every day | ORAL | Status: DC

## 2016-05-12 MED ORDER — PEG-KCL-NACL-NASULF-NA ASC-C 100 G PO SOLR
0.5000 | Freq: Once | ORAL | Status: AC
  Administered 2016-05-13: 100 g via ORAL

## 2016-05-12 MED ORDER — HYDRALAZINE HCL 20 MG/ML IJ SOLN: 10.0000 mg | INTRAMUSCULAR | Status: DC | PRN

## 2016-05-12 MED ORDER — IOPAMIDOL (ISOVUE-300) INJECTION 61%
30.0000 mL | Freq: Once | INTRAVENOUS | Status: AC | PRN
  Administered 2016-05-12: 30 mL via ORAL

## 2016-05-12 MED ORDER — ATORVASTATIN CALCIUM 40 MG PO TABS
40.0000 mg | ORAL_TABLET | Freq: Every day | ORAL | Status: DC
  Administered 2016-05-12: 40 mg via ORAL
  Filled 2016-05-12: qty 1

## 2016-05-12 MED ORDER — METOPROLOL TARTRATE 50 MG PO TABS
50.0000 mg | ORAL_TABLET | Freq: Two times a day (BID) | ORAL | Status: DC
  Administered 2016-05-12 – 2016-05-13 (×3): 50 mg via ORAL
  Filled 2016-05-12 (×3): qty 1

## 2016-05-12 MED ORDER — LEVOTHYROXINE SODIUM 75 MCG PO TABS
150.0000 ug | ORAL_TABLET | Freq: Every day | ORAL | Status: DC
  Administered 2016-05-12: 150 ug via ORAL
  Filled 2016-05-12: qty 2

## 2016-05-12 MED ORDER — LISINOPRIL 20 MG PO TABS
20.0000 mg | ORAL_TABLET | Freq: Every morning | ORAL | Status: DC
  Administered 2016-05-12 – 2016-05-13 (×2): 20 mg via ORAL
  Filled 2016-05-12 (×2): qty 1

## 2016-05-12 MED ORDER — PEG-KCL-NACL-NASULF-NA ASC-C 100 G PO SOLR: 1.0000 | Freq: Once | ORAL | Status: DC

## 2016-05-12 MED ORDER — ALLOPURINOL 100 MG PO TABS
200.0000 mg | ORAL_TABLET | Freq: Every day | ORAL | Status: DC
  Administered 2016-05-12 – 2016-05-13 (×2): 200 mg via ORAL
  Filled 2016-05-12 (×2): qty 2

## 2016-05-12 MED ORDER — APIXABAN 5 MG PO TABS
5.0000 mg | ORAL_TABLET | Freq: Once | ORAL | Status: AC
  Administered 2016-05-12: 5 mg via ORAL
  Filled 2016-05-12: qty 1

## 2016-05-12 MED ORDER — METOPROLOL TARTRATE 25 MG PO TABS
50.0000 mg | ORAL_TABLET | Freq: Once | ORAL | Status: AC
  Administered 2016-05-12: 50 mg via ORAL
  Filled 2016-05-12: qty 2

## 2016-05-12 MED ORDER — FENOFIBRATE 160 MG PO TABS
160.0000 mg | ORAL_TABLET | Freq: Every day | ORAL | Status: DC
  Administered 2016-05-12 – 2016-05-13 (×2): 160 mg via ORAL
  Filled 2016-05-12 (×2): qty 1

## 2016-05-12 MED ORDER — FAMOTIDINE IN NACL 20-0.9 MG/50ML-% IV SOLN
20.0000 mg | Freq: Two times a day (BID) | INTRAVENOUS | Status: DC
  Administered 2016-05-12 – 2016-05-13 (×3): 20 mg via INTRAVENOUS
  Filled 2016-05-12 (×3): qty 50

## 2016-05-12 MED ORDER — INSULIN GLARGINE 100 UNIT/ML ~~LOC~~ SOLN
10.0000 [IU] | Freq: Every day | SUBCUTANEOUS | Status: DC
  Administered 2016-05-13: 10 [IU] via SUBCUTANEOUS
  Filled 2016-05-12 (×2): qty 0.1

## 2016-05-12 MED ORDER — AMIODARONE HCL 200 MG PO TABS
200.0000 mg | ORAL_TABLET | Freq: Every day | ORAL | Status: DC
  Administered 2016-05-12 – 2016-05-13 (×2): 200 mg via ORAL
  Filled 2016-05-12 (×3): qty 1

## 2016-05-12 MED ORDER — FERROUS SULFATE 325 (65 FE) MG PO TABS
325.0000 mg | ORAL_TABLET | Freq: Three times a day (TID) | ORAL | Status: DC
  Administered 2016-05-12 – 2016-05-13 (×4): 325 mg via ORAL
  Filled 2016-05-12 (×3): qty 1

## 2016-05-12 MED ORDER — DILTIAZEM HCL ER COATED BEADS 180 MG PO CP24
180.0000 mg | ORAL_CAPSULE | Freq: Every day | ORAL | Status: DC
  Administered 2016-05-12 – 2016-05-13 (×2): 180 mg via ORAL
  Filled 2016-05-12 (×2): qty 1

## 2016-05-12 MED ORDER — INSULIN ASPART 100 UNIT/ML ~~LOC~~ SOLN
0.0000 [IU] | SUBCUTANEOUS | Status: DC
Start: 2016-05-12 — End: 2016-05-13
  Administered 2016-05-12: 3 [IU] via SUBCUTANEOUS
  Administered 2016-05-12 (×2): 2 [IU] via SUBCUTANEOUS
  Administered 2016-05-13: 1 [IU] via SUBCUTANEOUS
  Administered 2016-05-13: 3 [IU] via SUBCUTANEOUS

## 2016-05-12 NOTE — Plan of Care (Signed)
Problem: Education: Goal: Knowledge of [facility] Education information/materials will improve Outcome: Completed/Met Date Met: 05/12/16 Education provided regarding plan of care. Family at bedside

## 2016-05-12 NOTE — H&P (Signed)
History and Physical    [name] QDI:[id number] DOB: 07-21-1941 DOA: 05/11/2016  PCP: [name], MD  Patient coming from: Home.  Chief Complaint: Rectal bleeding.  HPI: Michael [name] is a 75 y.o. male with history of GI bleed from gastric AVMs, persistent atrial fibrillation, systolic heart failure, diabetes mellitus, hyperlipidemia and hypertension presents to the ER because of rectal bleeding. Yesterday around 2 PM patient had a large bout of frank rectal bleeding at home. Patient had further episodes in the ER at least 3. Each episode is associated with some crampy abdominal pain. Denies any nausea vomiting. Patient is on apixaban for anticoagulation secondary to atrial fibrillation. ER physician had discussed with Dr. Havery Moros, on-call gastroenterologist. Gastroenterology has recommended further observation for GI bleed. On my exam patient is hemodynamically stable. Abdomen appears benign. Denies any chest pain or shortness of breath. Patient's last colonoscopy in our system was in 2012, which only showed internal hemorrhoids. At that time patient also had capsule endoscopy which showed AVMs.  ED Course: Hemoglobin is around 11 1 g drop from recent.  Review of Systems: As per HPI, rest all negative.   Past Medical History:  Diagnosis Date  . Allergy   . Arthritis   . AVM (arteriovenous malformation) of colon   . CAD (coronary artery disease)    a. Cath 10/2014 - Mild to moderate diagonal, circumflex and obtuse marginal disease. Innominate artery sternosis.  . Carotid artery disease ([place])    a. Carotid dopple 03/2014 40-76% RICA &  80-88% LICA stenosis b. 1/10  . CKD (chronic kidney disease), stage III   . Dysrhythmia    ATRIAL FIBRILATION  . Elevated troponin    a. 09/2014 in setting of AF RVR-->Myoview: EF 49%, no ischemia/infarct->Med Rx.  . GERD (gastroesophageal reflux disease)   . Gout   . H/O transfusion of whole blood   . History of PFTs    PFTs 6/16:  FVC  3.73 (87%), FEV1 2.93 (88%), FEV1/FVC 78%, DLCO 69%  . Hyperlipidemia   . Hypertension   . Hypothyroidism   . Iron deficiency anemia   . PAF (paroxysmal atrial fibrillation) ([place])    a. 09/2014: Converted on Dilt;  b. CHA2DS2VASc = 3-->eliquis;  c. 09/2014 Echo: EF 55-60%, mild LVH, mildly dil LA.  [name] Personal history of colonic polyps 2007, 2008   adenoma each time, largest 12 mm in 2007  . Psoriasis   . Type II diabetes mellitus ([place])    TYPE 2    Past Surgical History:  Procedure Laterality Date  . APPENDECTOMY  02/09/2014  . COLONOSCOPY  02/10/2011   internal hemorrhoids  . COLONOSCOPY W/ POLYPECTOMY  04/09/2006   12 mm adenoma  . COLONOSCOPY W/ POLYPECTOMY  06/17/2007   5 mm adenoma  . ESOPHAGOGASTRODUODENOSCOPY  12/23/2011   Procedure: ESOPHAGOGASTRODUODENOSCOPY (EGD);  Surgeon: Irene Shipper, MD;  Location: [facility];  Service: Endoscopy;  Laterality: N/A;  with small bowel bx's  . GIVENS CAPSULE STUDY  12/28/2011  . KNEE ARTHROSCOPY     right  . LAPAROSCOPIC APPENDECTOMY N/A 02/09/2014   Procedure: APPENDECTOMY LAPAROSCOPIC;  Surgeon: Leighton Ruff, MD;  Location: [facility];  Service: General;  Laterality: N/A;  . LEFT HEART CATHETERIZATION WITH CORONARY ANGIOGRAM N/A 11/15/2014   Procedure: LEFT HEART CATHETERIZATION WITH CORONARY ANGIOGRAM;  Surgeon: Belva Crome, MD;  Location: [facility] CATH LAB;  Service: Cardiovascular;  Laterality: N/A;  . ROTATOR CUFF REPAIR     left  reports that he quit smoking about 10 years ago. His smoking use included Cigarettes. He has a 33.60 pack-year smoking history. He has never used smokeless tobacco. He reports that he drinks about 0.6 oz of alcohol per week . He reports that he does not use drugs.  Allergies  Allergen Reactions  . Percocet [Oxycodone-Acetaminophen] Nausea And Vomiting  . Invokana [Canagliflozin]     Side effects  . Metformin And Related     Upset stomach    Family History  Problem Relation Age of Onset  . Heart attack  Father   . Lung cancer Father   . Diabetes Father   . Stroke Father   . Hypertension Father   . Stroke Mother   . Hypertension Mother   . Cancer Brother     liver  . Heart disease Brother     chf  . COPD Sister   . Malignant hyperthermia Neg Hx     Prior to Admission medications   Medication Sig Start Date End Date Taking? Authorizing Provider  acetaminophen (TYLENOL) 650 MG CR tablet Take 1,300 mg by mouth every 8 (eight) hours as needed for pain.    Yes Historical Provider, MD  allopurinol (ZYLOPRIM) 100 MG tablet Take 2 tablets (200 mg total) by mouth daily. 02/03/16  Yes Evie Lacks [name], MD  amiodarone (PACERONE) 200 MG tablet Take 1 tablet (200 mg total) by mouth daily. 06/25/15  Yes Wellington Hampshire, MD  apixaban (ELIQUIS) 5 MG TABS tablet Take 1 tablet (5 mg total) by mouth 2 (two) times daily. 11/07/15  Yes Wellington Hampshire, MD  atorvastatin (LIPITOR) 40 MG tablet Take 1 tablet (40 mg total) by mouth daily. 03/09/16  Yes Josue Hector, MD  Cholecalciferol 1000 UNITS TBDP Take 1,000-5,000 Units by mouth daily. Takes 1000 units everyday except for on Saturday patient takes 5000 units   Yes Historical Provider, MD  diltiazem (CARDIZEM CD) 180 MG 24 hr capsule Take 1 capsule (180 mg total) by mouth daily. 12/16/15  Yes Wellington Hampshire, MD  fenofibrate 160 MG tablet TAKE 1 TABLET(160 MG) BY MOUTH DAILY 08/09/15  Yes Darlin Coco, MD  ferrous sulfate 325 (65 FE) MG tablet Take 325 mg by mouth 3 (three) times daily with meals.   Yes Historical Provider, MD  furosemide (LASIX) 20 MG tablet Take 1 tablet (20 mg total) by mouth daily. 12/16/15  Yes Evie Lacks [name], MD  glipiZIDE (GLUCOTROL) 10 MG tablet Take 10 mg by mouth daily. 05/06/16  Yes Historical Provider, MD  HUMIRA PEN 40 MG/0.8ML PNKT Inject 40 mg into the skin every 14 (fourteen) days. 06/18/15  Yes Historical Provider, MD  HYDROcodone-acetaminophen (NORCO/VICODIN) 5-325 MG tablet Take 1 tablet by mouth 2 (two) times  daily as needed for moderate pain or severe pain. 12/16/15  Yes Evie Lacks [name], MD  Insulin Glargine (TOUJEO SOLOSTAR) 300 UNIT/ML SOPN Inject 50 Units into the skin daily. Titrate up by 1 unit every 1-2 days for goal sugars of 120-150 04/17/16 04/17/20 Yes Aleksei V [name], MD  levothyroxine (SYNTHROID, LEVOTHROID) 150 MCG tablet TAKE 1 TABLET BY MOUTH DAILY BEFORE BREAKFAST 03/16/16  Yes Evie Lacks [name], MD  lisinopril (PRINIVIL,ZESTRIL) 20 MG tablet Take 1 tablet (20 mg total) by mouth every morning. 03/23/16  Yes Thurnell Lose, MD  metoprolol (LOPRESSOR) 50 MG tablet TAKE 1 TABLET(50 MG) BY MOUTH TWICE DAILY 11/15/15  Yes Wellington Hampshire, MD  omeprazole (PRILOSEC) 40 MG capsule TAKE 1 CAPSULE BY MOUTH  EVERY DAY 05/04/16  Yes Evie Lacks [name], MD  Blood Glucose Monitoring Suppl (ONE TOUCH ULTRA 2) W/DEVICE KIT Use as directed Dx E11.9 05/18/14   Cassandria Anger, MD  Blood Pressure Monitor KIT Use to check blood pressure daily Dx I10 10/04/15   Cassandria Anger, MD  ergocalciferol (VITAMIN D2) 50000 UNITS capsule Take 1 capsule (50,000 Units total) by mouth once a week. Patient not taking: Reported on 05/11/2016 11/08/13   Evie Lacks [name], MD  glucose blood (ONE TOUCH ULTRA TEST) test strip 1 each by Other route 4 (four) times daily. Use to check blood sugar four times a day  Dx: E11.9- insulin Dependant 04/06/16   Evie Lacks [name], MD  Insulin Pen Needle 31G X 5 MM MISC 1 Units by Does not apply route 4 (four) times daily. 03/17/16   Cassandria Anger, MD  Lancets [facility] ULTRASOFT) lancets Use to check blood sugars daily 05/18/14   Cassandria Anger, MD    Physical Exam: Vitals:   05/12/16 0152 05/12/16 0252 05/12/16 0501 05/12/16 0539  BP: 169/64 156/65 155/67 (!) 192/68  Pulse: (!) 56 (!) 52 (!) 52 (!) 51  Resp:  18 18 18   Temp:    97.7 F (36.5 C)  TempSrc:    Oral  SpO2:  100% 98% 98%  Weight:      Height:          Constitutional: Moderately built  and nourished. Vitals:   05/12/16 0152 05/12/16 0252 05/12/16 0501 05/12/16 0539  BP: 169/64 156/65 155/67 (!) 192/68  Pulse: (!) 56 (!) 52 (!) 52 (!) 51  Resp:  18 18 18   Temp:    97.7 F (36.5 C)  TempSrc:    Oral  SpO2:  100% 98% 98%  Weight:      Height:       Eyes: Anicteric no pallor. ENMT: No discharge from the ears eyes nose or mouth. Neck: No mass felt. No JVD appreciated. Respiratory: No rhonchi or crepitations. Cardiovascular: S1 and S2 heard. No murmurs appreciated. Abdomen: Soft nontender bowel sounds present. No guarding or rigidity. Musculoskeletal: No edema. No joint effusion. Skin: No rash. Skin appears warm. Neurologic: Alert awake oriented to time place and person. Moves all extremities. Psychiatric: Appears normal. Normal affect.   Labs on Admission: I have personally reviewed following labs and imaging studies  CBC:  Recent Labs Lab 05/08/16 1615 05/11/16 1644  WBC 4.4 4.6  NEUTROABS 2.9  --   HGB 12.3* 11.3*  HCT 34.3* 32.2*  MCV 88.9 87.3  PLT 192.0 884   Basic Metabolic Panel:  Recent Labs Lab 05/08/16 1615 05/11/16 1644  NA 139 139  K 4.8 4.1  CL 104 107  CO2 30 24  GLUCOSE 220* 255*  BUN 25* 27*  CREATININE 1.55* 1.52*  CALCIUM 9.3 8.8*   GFR: Estimated Creatinine Clearance: 56.1 mL/min (by C-G formula based on SCr of 1.52 mg/dL (H)). Liver Function Tests:  Recent Labs Lab 05/11/16 1644  AST 27  ALT 25  ALKPHOS 36*  BILITOT 0.5  PROT 6.8  ALBUMIN 4.1    Recent Labs Lab 05/11/16 1644  LIPASE 30   No results for input(s): AMMONIA in the last 168 hours. Coagulation Profile: No results for input(s): INR, PROTIME in the last 168 hours. Cardiac Enzymes: No results for input(s): CKTOTAL, CKMB, CKMBINDEX, TROPONINI in the last 168 hours. BNP (last 3 results) No results for input(s): PROBNP in the last 8760 hours. HbA1C: No results  for input(s): HGBA1C in the last 72 hours. CBG:  Recent Labs Lab 05/12/16 0536    GLUCAP 163*   Lipid Profile: No results for input(s): CHOL, HDL, LDLCALC, TRIG, CHOLHDL, LDLDIRECT in the last 72 hours. Thyroid Function Tests: No results for input(s): TSH, T4TOTAL, FREET4, T3FREE, THYROIDAB in the last 72 hours. Anemia Panel: No results for input(s): VITAMINB12, FOLATE, FERRITIN, TIBC, IRON, RETICCTPCT in the last 72 hours. Urine analysis:    Component Value Date/Time   COLORURINE YELLOW 05/11/2016 1600   APPEARANCEUR CLEAR 05/11/2016 1600   LABSPEC 1.021 05/11/2016 1600   PHURINE 5.5 05/11/2016 1600   GLUCOSEU NEGATIVE 05/11/2016 1600   GLUCOSEU NEGATIVE 11/07/2013 0754   HGBUR NEGATIVE 05/11/2016 1600   BILIRUBINUR NEGATIVE 05/11/2016 1600   BILIRUBINUR neg 03/08/2015 1105   KETONESUR NEGATIVE 05/11/2016 1600   PROTEINUR NEGATIVE 05/11/2016 1600   UROBILINOGEN negative 03/08/2015 1105   UROBILINOGEN 0.2 02/09/2014 1805   NITRITE NEGATIVE 05/11/2016 1600   LEUKOCYTESUR NEGATIVE 05/11/2016 1600   Sepsis Labs: @LABRCNTIP (procalcitonin:4,lacticidven:4) )No results found for this or any previous visit (from the past 240 hour(s)).   Radiological Exams on Admission: Ct Abdomen Pelvis Wo Contrast  Result Date: 05/12/2016 CLINICAL DATA:  Initial evaluation for acute abdominal pain, blood in stool. EXAM: CT ABDOMEN AND PELVIS WITHOUT CONTRAST TECHNIQUE: Multidetector CT imaging of the abdomen and pelvis was performed following the standard protocol without IV contrast. COMPARISON:  Prior CT from 02/09/2014. FINDINGS: Lower chest: Mild scattered atelectatic changes present within the visualized lung bases. Visualized lungs are otherwise clear. Hepatobiliary: The liver demonstrates a normal appearance. Multiple calcified stones present within the gallbladder lumen. No CT evidence for acute cholecystitis. No biliary dilatation. Pancreas: Pancreas within normal limits. Spleen: Spleen within normal limits. Adrenals/Urinary Tract: Adrenal glands are normal. Kidneys equal in  size. No nephrolithiasis or hydronephrosis. Mild bilateral perinephric stranding. 11 mm hyperdense left renal lesion, indeterminate. Ureters of normal caliber without acute abnormality. Stomach/Bowel: Stomach within normal limits. No evidence for bowel obstruction. Appendix not visualize, compatible with prior appendectomy. No acute inflammatory changes about the bowels. Vascular/Lymphatic: Intra-abdominal aorta of normal caliber without evidence for aneurysm. Scattered atheromatous plaque within the aorta and its branch vessels. No adenopathy. Reproductive: Prostate within normal limits. Other: No free air or fluid. Musculoskeletal: No acute osseous abnormality. No worrisome lytic or blastic osseous lesions. IMPRESSION: 1. No CT evidence for acute intra-abdominal or pelvic process. 2. Cholelithiasis. 3. 11 mm hyperdense left renal lesion. While this is felt to most likely reflect a small proteinaceous and/or hemorrhagic cyst, this is incompletely evaluated on this noncontrast examination. Further evaluation with dedicated renal mass protocol CT and/or MRI is recommended for full characterization. Electronically Signed   By: Jeannine Boga M.D.   On: 05/12/2016 02:26    Assessment/Plan Principal Problem:   Acute GI bleeding Active Problems:   DM (diabetes mellitus), type 2 with peripheral vascular complications ([facility])   PAF (paroxysmal atrial fibrillation) ([place])   Hypertension   Hypothyroidism    1. Acute GI bleeding - likely lower GI bleed since patient has frank rectal bleeding. Patient is hemodynamically stable. Will hold off Apixaban. Patient's last dose of apixaban was this morning around 1 AM. I have discussed with patient and patient's wife about the risk of holding apixaban and stroke. Patient and patient's wife is agreeable to hold apixaban due to active GI bleed. Follow CBC closely. Will keep patient nothing by mouth except medications. Patient does have a history of gastric AVMs. Last  colonoscopy  in 2012. 2. Hypertension uncontrolled - continue lisinopril Cardizem and metoprolol and I have placed patient on when necessary IV hydralazine. 3. Chronic atrial fibrillation - continue amiodarone, metoprolol and Cardizem. Holding off apixaban due to GI bleed. Patient agreeable to holding. Chads 2 vasc score is more than 2. 4. Diabetes mellitus type 2 - since patient is nothing by mouth I have placed patient on decreased dose of long-acting insulin Lantus 10 units with sliding scale coverage. Holding glipizide for now. 5. Hypothyroidism on Synthroid. 6. Blood loss anemia - follow CBC. 7. Chronic systolic heart failure appears compensated holding Lasix for now due to GI bleed.   DVT prophylaxis: SCDs. Code Status: Full code.  Family Communication: Patient's wife.  Disposition Plan: Home.  Consults called: ER physician had discussed with Dr. Havery Moros on-call gastroenterologist.  Admission status: Observation. Telemetry.    Rise Patience MD [facility] Pager [phone].  If 7PM-7AM, please contact night-coverage [email or web address] Password [email or web address]  05/12/2016, 5:59 AM

## 2016-05-12 NOTE — Progress Notes (Addendum)
Patient had a BM this afternoon. Unable to collect CDIFF specimen, lab will not accept d/t consistency. Stools are formed.

## 2016-05-12 NOTE — Progress Notes (Signed)
Michael [name] is a 76 y.o. male with history of GI bleed from gastric AVMs, persistent atrial fibrillation, systolic heart failure, diabetes mellitus, hyperlipidemia and hypertension presents to the ER because of rectal bleeding,. GI consulted for further evaluation.  H&H stable around 10.   Please see Dr Moise Boring note for detailed H&P.   Hosie Poisson, MD [phone]

## 2016-05-12 NOTE — Consult Note (Signed)
Referring Provider: EDP Primary Care Physician:  [name], MD Primary Gastroenterologist:  Dr. [name]  Reason for Consultation:  GIB   HPI: Michael [name] is a 75 y.o. male with history of IDA thought to be in part from small bowel AVMs, persistent atrial fibrillation (on Eliquis), systolic heart failure, diabetes mellitus, hyperlipidemia, and hypertension who presented to the ER because of rectal bleeding. Yesterday around 2 PM patient had a large bout of frank rectal bleeding at home. Patient had 2 further episodes in the ER. Each episode is associated with some crampy abdominal pain. Denies any nausea or vomiting. Patient is on apixaban for anticoagulation secondary to atrial fibrillation (last dose was given at 200 AM in the ED last night).  CT scan abdomen and pelvis without contrast showed no acute issues.  Lactic acid normal.  Hgb 11.3 grams, down from 12.3 grams three days prior.  Last colonoscopy 01/2011 by Dr. [name] was normal except for internal hemorrhoids.  Repeat recommended in 5 years due to history of adenomas in the past.  EGD 11/2011 was normal to D3 (by Dr. Henrene Pastor).  WCE in 12/2011 showed some small bowel AVM's.  Just of note, patient was hospitalized in August for CAP and was having a lot of diarrhea while admitted.  Cdiff Ag was positive but toxin was negative so he was not treated since it was documented that he did not have a BM in 24 hours.  He then also had a positive PCR, however.  Regardless, he was not treated and both the patient and his wife tell me that he had severe diarrhea in the hospital and has been having diarrhea at home since his discharge, sometimes 15+ times per day.     Past Medical History:  Diagnosis Date  . Allergy   . Arthritis   . AVM (arteriovenous malformation) of colon   . CAD (coronary artery disease)    a. Cath 10/2014 - Mild to moderate diagonal, circumflex and obtuse marginal disease. Innominate artery sternosis.  . Carotid  artery disease ([place])    a. Carotid dopple 03/2014 02-77% RICA &  41-28% LICA stenosis b. 7/86  . CKD (chronic kidney disease), stage III   . Dysrhythmia    ATRIAL FIBRILATION  . Elevated troponin    a. 09/2014 in setting of AF RVR-->Myoview: EF 49%, no ischemia/infarct->Med Rx.  . GERD (gastroesophageal reflux disease)   . Gout   . H/O transfusion of whole blood   . History of PFTs    PFTs 6/16:  FVC 3.73 (87%), FEV1 2.93 (88%), FEV1/FVC 78%, DLCO 69%  . Hyperlipidemia   . Hypertension   . Hypothyroidism   . Iron deficiency anemia   . PAF (paroxysmal atrial fibrillation) ([place])    a. 09/2014: Converted on Dilt;  b. CHA2DS2VASc = 3-->eliquis;  c. 09/2014 Echo: EF 55-60%, mild LVH, mildly dil LA.  [name] Personal history of colonic polyps 2007, 2008   adenoma each time, largest 12 mm in 2007  . Psoriasis   . Type II diabetes mellitus ([place])    TYPE 2    Past Surgical History:  Procedure Laterality Date  . APPENDECTOMY  02/09/2014  . COLONOSCOPY  02/10/2011   internal hemorrhoids  . COLONOSCOPY W/ POLYPECTOMY  04/09/2006   12 mm adenoma  . COLONOSCOPY W/ POLYPECTOMY  06/17/2007   5 mm adenoma  . ESOPHAGOGASTRODUODENOSCOPY  12/23/2011   Procedure: ESOPHAGOGASTRODUODENOSCOPY (EGD);  Surgeon: Irene Shipper, MD;  Location: [facility];  Service: Endoscopy;  Laterality: N/A;  with small bowel bx's  . GIVENS CAPSULE STUDY  12/28/2011  . KNEE ARTHROSCOPY     right  . LAPAROSCOPIC APPENDECTOMY N/A 02/09/2014   Procedure: APPENDECTOMY LAPAROSCOPIC;  Surgeon: Leighton Ruff, MD;  Location: [facility];  Service: General;  Laterality: N/A;  . LEFT HEART CATHETERIZATION WITH CORONARY ANGIOGRAM N/A 11/15/2014   Procedure: LEFT HEART CATHETERIZATION WITH CORONARY ANGIOGRAM;  Surgeon: Belva Crome, MD;  Location: [facility] CATH LAB;  Service: Cardiovascular;  Laterality: N/A;  . ROTATOR CUFF REPAIR     left    Prior to Admission medications   Medication Sig Start Date End Date Taking? Authorizing Provider    acetaminophen (TYLENOL) 650 MG CR tablet Take 1,300 mg by mouth every 8 (eight) hours as needed for pain.    Yes Historical Provider, MD  allopurinol (ZYLOPRIM) 100 MG tablet Take 2 tablets (200 mg total) by mouth daily. 02/03/16  Yes Evie Lacks [name], MD  amiodarone (PACERONE) 200 MG tablet Take 1 tablet (200 mg total) by mouth daily. 06/25/15  Yes Wellington Hampshire, MD  apixaban (ELIQUIS) 5 MG TABS tablet Take 1 tablet (5 mg total) by mouth 2 (two) times daily. 11/07/15  Yes Wellington Hampshire, MD  atorvastatin (LIPITOR) 40 MG tablet Take 1 tablet (40 mg total) by mouth daily. 03/09/16  Yes Josue Hector, MD  Cholecalciferol 1000 UNITS TBDP Take 1,000-5,000 Units by mouth daily. Takes 1000 units everyday except for on Saturday patient takes 5000 units   Yes Historical Provider, MD  diltiazem (CARDIZEM CD) 180 MG 24 hr capsule Take 1 capsule (180 mg total) by mouth daily. 12/16/15  Yes Wellington Hampshire, MD  fenofibrate 160 MG tablet TAKE 1 TABLET(160 MG) BY MOUTH DAILY 08/09/15  Yes Darlin Coco, MD  ferrous sulfate 325 (65 FE) MG tablet Take 325 mg by mouth 3 (three) times daily with meals.   Yes Historical Provider, MD  furosemide (LASIX) 20 MG tablet Take 1 tablet (20 mg total) by mouth daily. 12/16/15  Yes Evie Lacks [name], MD  glipiZIDE (GLUCOTROL) 10 MG tablet Take 10 mg by mouth daily. 05/06/16  Yes Historical Provider, MD  HUMIRA PEN 40 MG/0.8ML PNKT Inject 40 mg into the skin every 14 (fourteen) days. 06/18/15  Yes Historical Provider, MD  HYDROcodone-acetaminophen (NORCO/VICODIN) 5-325 MG tablet Take 1 tablet by mouth 2 (two) times daily as needed for moderate pain or severe pain. 12/16/15  Yes Evie Lacks [name], MD  Insulin Glargine (TOUJEO SOLOSTAR) 300 UNIT/ML SOPN Inject 50 Units into the skin daily. Titrate up by 1 unit every 1-2 days for goal sugars of 120-150 04/17/16 04/17/20 Yes Aleksei V [name], MD  levothyroxine (SYNTHROID, LEVOTHROID) 150 MCG tablet TAKE 1 TABLET BY MOUTH  DAILY BEFORE BREAKFAST 03/16/16  Yes Evie Lacks [name], MD  lisinopril (PRINIVIL,ZESTRIL) 20 MG tablet Take 1 tablet (20 mg total) by mouth every morning. 03/23/16  Yes Thurnell Lose, MD  metoprolol (LOPRESSOR) 50 MG tablet TAKE 1 TABLET(50 MG) BY MOUTH TWICE DAILY 11/15/15  Yes Wellington Hampshire, MD  omeprazole (PRILOSEC) 40 MG capsule TAKE 1 CAPSULE BY MOUTH EVERY DAY 05/04/16  Yes Cassandria Anger, MD  Blood Glucose Monitoring Suppl (ONE TOUCH ULTRA 2) W/DEVICE KIT Use as directed Dx E11.9 05/18/14   Cassandria Anger, MD  Blood Pressure Monitor KIT Use to check blood pressure daily Dx I10 10/04/15   Cassandria Anger, MD  ergocalciferol (VITAMIN D2) 50000 UNITS capsule Take 1 capsule (50,000 Units  total) by mouth once a week. Patient not taking: Reported on 05/11/2016 11/08/13   Evie Lacks [name], MD  glucose blood (ONE TOUCH ULTRA TEST) test strip 1 each by Other route 4 (four) times daily. Use to check blood sugar four times a day  Dx: E11.9- insulin Dependant 04/06/16   Evie Lacks [name], MD  Insulin Pen Needle 31G X 5 MM MISC 1 Units by Does not apply route 4 (four) times daily. 03/17/16   Cassandria Anger, MD  Lancets [facility] ULTRASOFT) lancets Use to check blood sugars daily 05/18/14   Cassandria Anger, MD    Current Facility-Administered Medications  Medication Dose Route Frequency Provider Last Rate Last Dose  . 0.9 %  sodium chloride infusion   Intravenous Continuous Rise Patience, MD      . allopurinol (ZYLOPRIM) tablet 200 mg  200 mg Oral Daily Rise Patience, MD      . amiodarone (PACERONE) tablet 200 mg  200 mg Oral Daily Rise Patience, MD      . atorvastatin (LIPITOR) tablet 40 mg  40 mg Oral q1800 Rise Patience, MD      . diltiazem (CARDIZEM CD) 24 hr capsule 180 mg  180 mg Oral Daily Rise Patience, MD      . famotidine (PEPCID) IVPB 20 mg premix  20 mg Intravenous Q12H Rise Patience, MD      . fenofibrate tablet 160 mg  160  mg Oral Daily Rise Patience, MD      . ferrous sulfate tablet 325 mg  325 mg Oral TID WC Rise Patience, MD      . hydrALAZINE (APRESOLINE) injection 10 mg  10 mg Intravenous Q4H PRN Rise Patience, MD      . insulin aspart (novoLOG) injection 0-9 Units  0-9 Units Subcutaneous Q4H Rise Patience, MD      . insulin glargine (LANTUS) injection 10 Units  10 Units Subcutaneous Daily Rise Patience, MD      . levothyroxine (SYNTHROID, LEVOTHROID) tablet 150 mcg  150 mcg Oral QAC breakfast Rise Patience, MD      . lisinopril (PRINIVIL,ZESTRIL) tablet 20 mg  20 mg Oral q morning - 10a Rise Patience, MD      . metoprolol (LOPRESSOR) tablet 50 mg  50 mg Oral BID Rise Patience, MD      . ondansetron [facility]) tablet 4 mg  4 mg Oral Q6H PRN Rise Patience, MD       Or  . ondansetron [facility]) injection 4 mg  4 mg Intravenous Q6H PRN Rise Patience, MD        Allergies as of 05/11/2016 - Review Complete 05/11/2016  Allergen Reaction Noted  . Percocet [oxycodone-acetaminophen] Nausea And Vomiting 01/27/2011  . Invokana [canagliflozin]  03/17/2016  . Metformin and related  03/17/2016    Family History  Problem Relation Age of Onset  . Heart attack Father   . Lung cancer Father   . Diabetes Father   . Stroke Father   . Hypertension Father   . Stroke Mother   . Hypertension Mother   . Cancer Brother     liver  . Heart disease Brother     chf  . COPD Sister   . Malignant hyperthermia Neg Hx     Social History   Social History  . Marital status: Married    Spouse name: N/A  . Number of children: N/A  .  Years of education: 22   Occupational History  .  Retired    Social History Main Topics  . Smoking status: Former Smoker    Packs/day: 0.70    Years: 48.00    Types: Cigarettes    Quit date: 03/29/2006  . Smokeless tobacco: Never Used  . Alcohol use 0.6 oz/week    1 Cans of beer per week     Comment: occasional alcohol intake  .  Drug use: No  . Sexual activity: Not Currently   Other Topics Concern  . Not on file   Social History Narrative   Regular exercise-no   Caffeine Use-yes    Review of Systems: Ten point ROS is O/W negative except as mentioned in HPI.  Physical Exam: Vital signs in last 24 hours: Temp:  [97.4 F (36.3 C)-98.2 F (36.8 C)] 97.4 F (36.3 C) (10/17 0815) Pulse Rate:  [51-67] 55 (10/17 0815) Resp:  [16-20] 20 (10/17 0815) BP: (139-216)/(57-74) 139/57 (10/17 0815) SpO2:  [97 %-100 %] 98 % (10/17 0815) Weight:  [255 lb 6.4 oz (115.8 kg)-256 lb 8 oz (116.3 kg)] 255 lb 6.4 oz (115.8 kg) (10/17 0815) Last BM Date: 05/12/16 General:  Alert, Well-developed, well-nourished, pleasant and cooperative in NAD Head:  Normocephalic and atraumatic. Eyes:  Sclera clear, no icterus.  Conjunctiva pink. Ears:  Normal auditory acuity. Mouth:  No deformity or lesions.   Lungs:  Clear throughout to auscultation.  No wheezes, crackles, or rhonchi.  Heart:  Irregularly irregular. Abdomen:  Soft, non-distended.  BS present.  Mild lower abdominal TTP. Rectal:  Deferred  Msk:  Symmetrical without gross deformities. Pulses:  Normal pulses noted. Extremities:  Without clubbing or edema. Neurologic:  Alert and oriented x 4;  grossly normal neurologically. Skin:  Intact without significant lesions or rashes. Psych:  Alert and cooperative. Normal mood and affect.  Lab Results:  Recent Labs  05/11/16 1644 05/12/16 0616  WBC 4.6 3.8*  HGB 11.3* 10.8*  HCT 32.2* 32.0*  PLT 156 124*   BMET  Recent Labs  05/11/16 1644 05/12/16 0616  NA 139 138  K 4.1 3.6  CL 107 104  CO2 24 27  GLUCOSE 255* 173*  BUN 27* 25*  CREATININE 1.52* 1.41*  CALCIUM 8.8* 8.7*   LFT  Recent Labs  05/12/16 0616  PROT 6.9  ALBUMIN 4.0  AST 26  ALT 21  ALKPHOS 31*  BILITOT 0.9   Studies/Results: Ct Abdomen Pelvis Wo Contrast  Result Date: 05/12/2016 CLINICAL DATA:  Initial evaluation for acute abdominal  pain, blood in stool. EXAM: CT ABDOMEN AND PELVIS WITHOUT CONTRAST TECHNIQUE: Multidetector CT imaging of the abdomen and pelvis was performed following the standard protocol without IV contrast. COMPARISON:  Prior CT from 02/09/2014. FINDINGS: Lower chest: Mild scattered atelectatic changes present within the visualized lung bases. Visualized lungs are otherwise clear. Hepatobiliary: The liver demonstrates a normal appearance. Multiple calcified stones present within the gallbladder lumen. No CT evidence for acute cholecystitis. No biliary dilatation. Pancreas: Pancreas within normal limits. Spleen: Spleen within normal limits. Adrenals/Urinary Tract: Adrenal glands are normal. Kidneys equal in size. No nephrolithiasis or hydronephrosis. Mild bilateral perinephric stranding. 11 mm hyperdense left renal lesion, indeterminate. Ureters of normal caliber without acute abnormality. Stomach/Bowel: Stomach within normal limits. No evidence for bowel obstruction. Appendix not visualize, compatible with prior appendectomy. No acute inflammatory changes about the bowels. Vascular/Lymphatic: Intra-abdominal aorta of normal caliber without evidence for aneurysm. Scattered atheromatous plaque within the aorta and its branch vessels. No  adenopathy. Reproductive: Prostate within normal limits. Other: No free air or fluid. Musculoskeletal: No acute osseous abnormality. No worrisome lytic or blastic osseous lesions. IMPRESSION: 1. No CT evidence for acute intra-abdominal or pelvic process. 2. Cholelithiasis. 3. 11 mm hyperdense left renal lesion. While this is felt to most likely reflect a small proteinaceous and/or hemorrhagic cyst, this is incompletely evaluated on this noncontrast examination. Further evaluation with dedicated renal mass protocol CT and/or MRI is recommended for full characterization. Electronically Signed   By: Jeannine Boga M.D.   On: 05/12/2016 02:26   IMPRESSION:  -75 year old male who presented  with rectal bleeding in the setting of anticoagulation with Eliquis for atrial fibrillation. -Rectal bleeding:  Is overdue for colonoscopy.  No evidence of diverticular disease on previous studies or imaging.  Patient reports diarrhea and small amounts of bleeding related to hemorrhoid irritation with that.  Also, patient was positive for Cdiff in August and was not treated.  Cdiff does not usually cause bleeding, but I suppose it is possible with ongoing, untreated infection. -Diarrhea:  Positive for Cdiff at hospitalization in August but not treated.  This has been severe,  15+ times per day at times. -Persistent atrial fibrillation, on Eliquis:  Last dose was today, 10/17 at 200 AM. -Chronic anemia:  Evaluated thoroughly from GI standpoint in the past.  Thought in part to be from small bowel AVM's.  Maintained on oral iron supplements at home.  Hgb stable in the setting of acute GI bleeding. -IDDM -Psoriasis and arthritis:  On Humira.  PLAN: -Discussed with Dr. Havery Moros, patient's nurse, patient, and wife/son.  Will plan to check Cdiff PCR.  If positive then will plan to treat that and observe for further bleeding.  If no further bleeding then will complete Cdiff treatment and can have outpatient colonoscopy in several weeks. -If Cdiff is negative then will proceed with colonoscopy +/- EGD on 10/18. -Monitor Hgb. -If re-bleeds briskly then may need nuc med bleeding scan.   Ren Grasse D.  05/12/2016, 9:28 AM  Pager number [phone]

## 2016-05-12 NOTE — Progress Notes (Addendum)
Nursing Note: Received report from [place]. Pt arrived and ambulatory.Alert X4 T-97.7 P-51 R-18 Bp 192/68.PO2.98% on r/a.Pt denies pain.wbb

## 2016-05-12 NOTE — Care Management Obs Status (Signed)
[facility] NOTIFICATION   Patient Details  Name: Michael [name] MRN: [id number] Date of Birth: August 22, 1940   Medicare Observation Status Notification Given:  Yes    MahabirJuliann Pulse, RN 05/12/2016, 12:23 PM

## 2016-05-12 NOTE — Care Management Note (Signed)
Case Management Note  Patient Details  Name: Michael [name] MRN: [id number] Date of Birth: 06-16-1941  Subjective/Objective:  75 y/o m admitted w/Acute GIB. Gi cons;NPO. From home.                  Action/Plan:d/c plan home.   Expected Discharge Date:                  Expected Discharge Plan:  Home/Self Care  In-House Referral:     Discharge planning Services  CM Consult  Post Acute Care Choice:    Choice offered to:     DME Arranged:    DME Agency:     HH Arranged:    HH Agency:     Status of Service:  In process, will continue to follow  If discussed at Long Length of Stay Meetings, dates discussed:    Additional Comments:  Dessa Phi, RN 05/12/2016, 12:23 PM

## 2016-05-13 ENCOUNTER — Encounter (HOSPITAL_COMMUNITY): Payer: Self-pay

## 2016-05-13 ENCOUNTER — Encounter (HOSPITAL_COMMUNITY)
Admission: EM | Disposition: A | Discharge status: Home / Self Care | Payer: Self-pay | Source: Home / Self Care | Attending: Emergency Medicine

## 2016-05-13 ENCOUNTER — Observation Stay (HOSPITAL_COMMUNITY): Payer: Medicare Other | Admitting: Anesthesiology

## 2016-05-13 DIAGNOSIS — D122 Benign neoplasm of ascending colon: Secondary | ICD-10-CM | POA: Diagnosis not present

## 2016-05-13 DIAGNOSIS — I480 Paroxysmal atrial fibrillation: Secondary | ICD-10-CM | POA: Diagnosis not present

## 2016-05-13 DIAGNOSIS — K922 Gastrointestinal hemorrhage, unspecified: Secondary | ICD-10-CM | POA: Diagnosis not present

## 2016-05-13 DIAGNOSIS — E1122 Type 2 diabetes mellitus with diabetic chronic kidney disease: Secondary | ICD-10-CM | POA: Diagnosis not present

## 2016-05-13 DIAGNOSIS — N183 Chronic kidney disease, stage 3 (moderate): Secondary | ICD-10-CM | POA: Diagnosis not present

## 2016-05-13 DIAGNOSIS — I482 Chronic atrial fibrillation: Secondary | ICD-10-CM | POA: Diagnosis not present

## 2016-05-13 DIAGNOSIS — D120 Benign neoplasm of cecum: Secondary | ICD-10-CM | POA: Diagnosis not present

## 2016-05-13 DIAGNOSIS — A0472 Enterocolitis due to Clostridium difficile, not specified as recurrent: Secondary | ICD-10-CM | POA: Diagnosis not present

## 2016-05-13 DIAGNOSIS — E039 Hypothyroidism, unspecified: Secondary | ICD-10-CM | POA: Diagnosis not present

## 2016-05-13 DIAGNOSIS — I130 Hypertensive heart and chronic kidney disease with heart failure and stage 1 through stage 4 chronic kidney disease, or unspecified chronic kidney disease: Secondary | ICD-10-CM | POA: Diagnosis not present

## 2016-05-13 DIAGNOSIS — I129 Hypertensive chronic kidney disease with stage 1 through stage 4 chronic kidney disease, or unspecified chronic kidney disease: Secondary | ICD-10-CM | POA: Diagnosis not present

## 2016-05-13 DIAGNOSIS — K648 Other hemorrhoids: Secondary | ICD-10-CM

## 2016-05-13 DIAGNOSIS — K644 Residual hemorrhoidal skin tags: Principal | ICD-10-CM

## 2016-05-13 DIAGNOSIS — K649 Unspecified hemorrhoids: Secondary | ICD-10-CM | POA: Diagnosis not present

## 2016-05-13 DIAGNOSIS — I5022 Chronic systolic (congestive) heart failure: Secondary | ICD-10-CM | POA: Diagnosis not present

## 2016-05-13 DIAGNOSIS — K219 Gastro-esophageal reflux disease without esophagitis: Secondary | ICD-10-CM | POA: Diagnosis not present

## 2016-05-13 DIAGNOSIS — K635 Polyp of colon: Secondary | ICD-10-CM | POA: Diagnosis not present

## 2016-05-13 DIAGNOSIS — E1151 Type 2 diabetes mellitus with diabetic peripheral angiopathy without gangrene: Secondary | ICD-10-CM | POA: Diagnosis not present

## 2016-05-13 DIAGNOSIS — I481 Persistent atrial fibrillation: Secondary | ICD-10-CM | POA: Diagnosis not present

## 2016-05-13 HISTORY — PX: COLONOSCOPY WITH PROPOFOL: SHX5780

## 2016-05-13 HISTORY — DX: Enterocolitis due to Clostridium difficile, not specified as recurrent: A04.72

## 2016-05-13 LAB — HEMOGLOBIN AND HEMATOCRIT, BLOOD
HEMATOCRIT: 31.5 % — AB (ref 39.0–52.0)
HEMATOCRIT: 32 % — AB (ref 39.0–52.0)
HEMOGLOBIN: 10.7 g/dL — AB (ref 13.0–17.0)
HEMOGLOBIN: 10.6 g/dL — AB (ref 13.0–17.0)

## 2016-05-13 LAB — GLUCOSE, CAPILLARY
GLUCOSE-CAPILLARY: 206 mg/dL — AB (ref 65–99)
GLUCOSE-CAPILLARY: 177 mg/dL — AB (ref 65–99)
Glucose-Capillary: 178 mg/dL — ABNORMAL HIGH (ref 65–99)

## 2016-05-13 LAB — CLOSTRIDIUM DIFFICILE BY PCR: CDIFFPCR: POSITIVE — AB

## 2016-05-13 SURGERY — COLONOSCOPY WITH PROPOFOL
Anesthesia: Monitor Anesthesia Care

## 2016-05-13 MED ORDER — PROPOFOL 10 MG/ML IV BOLUS
INTRAVENOUS | Status: AC
  Filled 2016-05-13: qty 60

## 2016-05-13 MED ORDER — LACTATED RINGERS IV SOLN
INTRAVENOUS | Status: DC | PRN
  Administered 2016-05-13: 12:00:00 via INTRAVENOUS

## 2016-05-13 MED ORDER — VANCOMYCIN 50 MG/ML ORAL SOLUTION
125.0000 mg | Freq: Four times a day (QID) | ORAL | Status: DC
  Administered 2016-05-13: 125 mg via ORAL
  Filled 2016-05-13 (×2): qty 2.5

## 2016-05-13 MED ORDER — PROPOFOL 500 MG/50ML IV EMUL
INTRAVENOUS | Status: DC | PRN
  Administered 2016-05-13: 75 ug/kg/min via INTRAVENOUS

## 2016-05-13 MED ORDER — HYDROCORTISONE ACETATE 25 MG RE SUPP: 25.0000 mg | Freq: Two times a day (BID) | RECTAL | 0 refills | Status: DC

## 2016-05-13 MED ORDER — VANCOMYCIN 50 MG/ML ORAL SOLUTION: 125.0000 mg | Freq: Four times a day (QID) | ORAL | 0 refills | Status: AC

## 2016-05-13 MED ORDER — APIXABAN 5 MG PO TABS: 5.0000 mg | ORAL_TABLET | Freq: Two times a day (BID) | ORAL | 5 refills | Status: DC

## 2016-05-13 MED ORDER — LIDOCAINE HCL (CARDIAC) 20 MG/ML IV SOLN
INTRAVENOUS | Status: DC | PRN
  Administered 2016-05-13: 50 mg via INTRAVENOUS

## 2016-05-13 MED ORDER — LIDOCAINE 2% (20 MG/ML) 5 ML SYRINGE
INTRAMUSCULAR | Status: AC
  Filled 2016-05-13: qty 5

## 2016-05-13 MED ORDER — HYDROCORTISONE ACETATE 25 MG RE SUPP
25.0000 mg | Freq: Two times a day (BID) | RECTAL | Status: DC
  Filled 2016-05-13: qty 1

## 2016-05-13 SURGICAL SUPPLY — 25 items

## 2016-05-13 NOTE — Discharge Instructions (Addendum)
Hemorrhoids Hemorrhoids are puffy (swollen) veins around the rectum or anus. Hemorrhoids can cause pain, itching, bleeding, or irritation. HOME CARE  Eat foods with fiber, such as whole grains, beans, nuts, fruits, and vegetables. Ask your doctor about taking products with added fiber in them (fibersupplements).  Drink enough fluid to keep your pee (urine) clear or pale yellow.  Exercise often.  Go to the bathroom when you have the urge to poop. Do not wait.  Avoid straining to poop (bowel movement).  Keep the butt area dry and clean. Use wet toilet paper or moist paper towels.  Medicated creams and medicine inserted into the anus (anal suppository) may be used or applied as told.  Only take medicine as told by your doctor.  Take a warm water bath (sitz bath) for 15-20 minutes to ease pain. Do this 3-4 times a day.  Place ice packs on the area if it is tender or puffy. Use the ice packs between the warm water baths.  Put ice in a plastic bag.  Place a towel between your skin and the bag.  Leave the ice on for 15-20 minutes, 03-04 times a day.  Do not use a donut-shaped pillow or sit on the toilet for a long time. GET HELP RIGHT AWAY IF:   You have more pain that is not controlled by treatment or medicine.  You have bleeding that will not stop.  You have trouble or are unable to poop (bowel movement).  You have pain or puffiness outside the area of the hemorrhoids. MAKE SURE YOU:   Understand these instructions.  Will watch your condition.  Will get help right away if you are not doing well or get worse.   This information is not intended to replace advice given to you by your health care provider. Make sure you discuss any questions you have with your health care provider.   Document Released: 04/21/2008 Document Revised: 06/29/2012 Document Reviewed: 05/24/2012 Elsevier Interactive Patient Education 2016 Else[employer] Inc[employer]   Clostridium Difficile  Infection Clostridium difficile (C. difficile or C. diff) is a bacterium normally found in the intestinal tract or colon. C. difficile infection causes diarrhea and sometimes a severe disease called pseudomembranous colitis (C. difficile colitis). C. difficile colitis can damage the lining of the colon or cause the colon to become very large (toxic megacolon). Older adults and people with certain medical conditions have a greater risk of getting C. difficile infections. CAUSES The balance of bacteria in your colon can change when you are sick, especially when taking antibiotic medicine. Taking antibiotics may allow the C. difficile to grow, multiply, and make a toxin that causes C. difficile infection.  SYMPTOMS  Diarrhea.  Fever.  Fatigue.  Loss of appetite.  Nausea.  Abdominal swelling, pain, or tenderness.  Dehydration. DIAGNOSIS Your health care provider may suspect C. difficile infection based on your symptoms and if you have taken antibiotics recently. Your health care provider may also order:  A lab test that can detect the toxin in your stool.  A sigmoidoscopy or colonoscopy to look at the appearance of your colon. These procedures involve passing an instrument through your rectum to look at the inside of your colon. Your health care provider will help determine if these tests are necessary. TREATMENT Treatment may include:  Taking antibiotics that keep C. difficile from growing.  Stopping the antibiotics you were on before the C. difficile infection began. Only do this if instructed to do so by your health care provider.  IV fluids and correction of electrolyte imbalance.  Surgery to remove the infected part of the intestines. This is rare. HOME CARE INSTRUCTIONS  Drink enough fluids to keep your urine clear or pale yellow. Avoid milk, caffeine, and alcohol.  Ask your health care provider for specific rehydration instructions.  Eat small, frequent meals rather than  large meals.  Take your antibiotics as directed. Finish them even if you start to feel better.  Do not use medicines to slow diarrhea. This could delay healing or cause problems.  Wash your hands thoroughly after using the bathroom and before preparing food. Make sure people who live with you wash their hands often, too.  Clean all surfaces with a product that contains chlorine bleach. SEEK MEDICAL CARE IF:  Your diarrhea lasts longer than expected or comes back after you finish your antibiotic medicine for the C. difficile infection.  You have trouble staying hydrated.  You have a fever. SEEK IMMEDIATE MEDICAL CARE IF:  You have increasing abdominal pain or tenderness.  You have blood in your stools, or your stools look dark black and tarry.  You cannot eat or drink without vomiting.   This information is not intended to replace advice given to you by your health care provider. Make sure you discuss any questions you have with your health care provider.   Document Released: 04/22/2005 Document Revised: 08/03/2014 Document Reviewed: 01/14/2015 Elsevier Interactive Patient Education [employer].

## 2016-05-13 NOTE — Discharge Summary (Signed)
Physician Discharge Summary  Michael [name] FTD:[id number] DOB: 04-18-1941 DOA: 05/11/2016  PCP: [name], MD  Admit date: 05/11/2016 Discharge date: 05/13/2016  Admitted From: Home Disposition: Home  Recommendations for Outpatient Follow-up:  1. Follow up with PCP in 1-2 weeks. GI will arrange outpatient follow-up repeat colonoscopy and polypectomy. 2. Patient will complete 2 weeks course of oral vancomycin for C. difficile on 11/5. 3. Patient will resume anticoagulation on 10/22. 4. Please review CT abdomen result, incidental finding of hyperdense left renal lesion (hemorrhagic versus proteinaceous cyst, recommends renal CT versus MRI as outpatient)  Home Health: None Equipment/Devices: None  Discharge Condition: Fair CODE STATUS: Full code Diet recommendation: Diabetic/heart healthy   Discharge Diagnoses:  Principal Problem:   Acute lower GI bleeding   Active Problems:   Bleeding external hemorrhoids   Clostridium difficile colitis   DM (diabetes mellitus), type 2 with peripheral vascular complications (HCC)   PAF (paroxysmal atrial fibrillation) (HCC)   Hypertension   Hypothyroidism   Brief narrative/history of present illness 75 year old male with history of GI bleed from gastric AVMs, history of internal hemorrhoids, persistent A. fib on anticoagulant and, systolic CHF, diabetes mellitus, hyperlipidemia, hypertension presented to the ED with and a history of rectal bleed associated with crampy abdominal pain. He had further 3 episodes of bleeding in the ED. He also reported having several loose bowel movements. Denied any nausea, vomiting or hematemesis. Vitals were stable. Noted for slight drop in H&H. [facility] consulted from ED and recommended monitor under observation.   Hospital course Acute lower GI bleed No further symptom. Anticoagulation held. H&H remained stable. [facility] consulted and patient underwent colonoscopy showing:  1.nonthrombosed  external hemorrhoids;  2. Large sessile polyp in the distal descending colon/hepatic flexure, no signs of bleeding. Few 3-6 minute polyps in the sigmoid colon. Normal ileum.  3. two sessile polyps in the ascending colon, 4-7 mm in size.   4. Nonbleeding internal hemorrhoids.  None of the polyps were removed in the setting of active GI bleed on antibiotics and. GI suspected hemorrhoidal bleed in the setting of anticoagulation and C. difficile diarrhea. Recommend to hold anticoagulation for next few days. Plan is for repeat colonoscopy with polypectomy as outpatient.  Patient instructed on daily fiber supplement, Anusol when necessary and sitz bath for hemorrhoids.  Resume diet and can be discharged home with outpatient follow-up.   Diarrhea presumed due to C. difficile colitis. Positive by PCR. Treating with 2 weeks of oral vancomycin.  Diabetes mellitus type 2, uncontrolled Last A1c of 8.9. Continue home dose Lantus and glipizide.  A. fib Rate controlled. Continue metoprolol. Holding Eliquis for next few days.  Uncontrolled hypertension Resume home blood pressure medications. Currently stable.   hypothyroidism Continue Synthroid  Acute Blood loss anemia  Mild. No transfusion needed. Currently stable.    chronic systolic CHF Euvolemic. Continue home medications.  Coronary artery disease/carotid artery disease Cardiac cath in 10/2014 swelling mild to moderate disease. Continue beta blocker and statin.  Chronic kidney disease stage III Stable at baseline.   ? Proteinaceous versus hemorrhagic left renal cyst (measuring 11 mm) Seen incidentally on CT scan. Recommend further evaluation with dedicated renal mass protocol CT or MRI.   Family communication: None at bedside  Consults: [facility]  Disposition: Home with outpatient follow-up   Discharge Instructions     Medication List    STOP taking these medications   ergocalciferol 50000 units capsule Commonly known  as:  VITAMIN D2  TAKE these medications   acetaminophen 650 MG CR tablet Commonly known as:  TYLENOL Take 1,300 mg by mouth every 8 (eight) hours as needed for pain.   allopurinol 100 MG tablet Commonly known as:  ZYLOPRIM Take 2 tablets (200 mg total) by mouth daily.   amiodarone 200 MG tablet Commonly known as:  PACERONE Take 1 tablet (200 mg total) by mouth daily.   apixaban 5 MG Tabs tablet Commonly known as:  ELIQUIS Take 1 tablet (5 mg total) by mouth 2 (two) times daily. Start taking on:  05/17/2016   atorvastatin 40 MG tablet Commonly known as:  LIPITOR Take 1 tablet (40 mg total) by mouth daily.   Blood Pressure Monitor Kit Use to check blood pressure daily Dx I10   Cholecalciferol 1000 units Tbdp Take 1,000-5,000 Units by mouth daily. Takes 1000 units everyday except for on Saturday patient takes 5000 units   diltiazem 180 MG 24 hr capsule Commonly known as:  CARDIZEM CD Take 1 capsule (180 mg total) by mouth daily.   fenofibrate 160 MG tablet TAKE 1 TABLET(160 MG) BY MOUTH DAILY   ferrous sulfate 325 (65 FE) MG tablet Take 325 mg by mouth 3 (three) times daily with meals.   furosemide 20 MG tablet Commonly known as:  LASIX Take 1 tablet (20 mg total) by mouth daily.   glipiZIDE 10 MG tablet Commonly known as:  GLUCOTROL Take 10 mg by mouth daily.   glucose blood test strip Commonly known as:  ONE TOUCH ULTRA TEST 1 each by Other route 4 (four) times daily. Use to check blood sugar four times a day  Dx: E11.9- insulin Dependant   HUMIRA PEN 40 MG/0.8ML Pnkt Generic drug:  Adalimumab Inject 40 mg into the skin every 14 (fourteen) days.   HYDROcodone-acetaminophen 5-325 MG tablet Commonly known as:  NORCO/VICODIN Take 1 tablet by mouth 2 (two) times daily as needed for moderate pain or severe pain.   hydrocortisone 25 MG suppository Commonly known as:  ANUSOL-HC Place 1 suppository (25 mg total) rectally 2 (two) times daily.   Insulin  Glargine 300 UNIT/ML Sopn Commonly known as:  TOUJEO SOLOSTAR Inject 50 Units into the skin daily. Titrate up by 1 unit every 1-2 days for goal sugars of 120-150   Insulin Pen Needle 31G X 5 MM Misc 1 Units by Does not apply route 4 (four) times daily.   levothyroxine 150 MCG tablet Commonly known as:  SYNTHROID, LEVOTHROID TAKE 1 TABLET BY MOUTH DAILY BEFORE BREAKFAST   lisinopril 20 MG tablet Commonly known as:  PRINIVIL,ZESTRIL Take 1 tablet (20 mg total) by mouth every morning.   metoprolol 50 MG tablet Commonly known as:  LOPRESSOR TAKE 1 TABLET(50 MG) BY MOUTH TWICE DAILY   omeprazole 40 MG capsule Commonly known as:  PRILOSEC TAKE 1 CAPSULE BY MOUTH EVERY DAY   ONE TOUCH ULTRA 2 w/Device Kit Use as directed Dx E11.9   onetouch ultrasoft lancets Use to check blood sugars daily   vancomycin 50 mg/mL oral solution Commonly known as:  VANCOCIN Take 2.5 mLs (125 mg total) by mouth every 6 (six) hours.      Follow-up Information    Silvano Rusk, MD Follow up on 06/08/2016.   Specialty:  Gastroenterology Why:  3:30 pm Contact information: [address] [place] [address] [phone]        [name], MD. Schedule an appointment as soon as possible for a visit in 1 week(s).   Specialty:  Internal Medicine Contact information: [place] [address] [phone]          Allergies  Allergen Reactions  . Percocet [Oxycodone-Acetaminophen] Nausea And Vomiting  . Invokana [Canagliflozin]     Side effects  . Metformin And Related     Upset stomach      Procedures/Studies: Ct Abdomen Pelvis Wo Contrast  Result Date: 05/12/2016 CLINICAL DATA:  Initial evaluation for acute abdominal pain, blood in stool. EXAM: CT ABDOMEN AND PELVIS WITHOUT CONTRAST TECHNIQUE: Multidetector CT imaging of the abdomen and pelvis was performed following the standard protocol without IV contrast. COMPARISON:  Prior CT from 02/09/2014. FINDINGS: Lower  chest: Mild scattered atelectatic changes present within the visualized lung bases. Visualized lungs are otherwise clear. Hepatobiliary: The liver demonstrates a normal appearance. Multiple calcified stones present within the gallbladder lumen. No CT evidence for acute cholecystitis. No biliary dilatation. Pancreas: Pancreas within normal limits. Spleen: Spleen within normal limits. Adrenals/Urinary Tract: Adrenal glands are normal. Kidneys equal in size. No nephrolithiasis or hydronephrosis. Mild bilateral perinephric stranding. 11 mm hyperdense left renal lesion, indeterminate. Ureters of normal caliber without acute abnormality. Stomach/Bowel: Stomach within normal limits. No evidence for bowel obstruction. Appendix not visualize, compatible with prior appendectomy. No acute inflammatory changes about the bowels. Vascular/Lymphatic: Intra-abdominal aorta of normal caliber without evidence for aneurysm. Scattered atheromatous plaque within the aorta and its branch vessels. No adenopathy. Reproductive: Prostate within normal limits. Other: No free air or fluid. Musculoskeletal: No acute osseous abnormality. No worrisome lytic or blastic osseous lesions. IMPRESSION: 1. No CT evidence for acute intra-abdominal or pelvic process. 2. Cholelithiasis. 3. 11 mm hyperdense left renal lesion. While this is felt to most likely reflect a small proteinaceous and/or hemorrhagic cyst, this is incompletely evaluated on this noncontrast examination. Further evaluation with dedicated renal mass protocol CT and/or MRI is recommended for full characterization. Electronically Signed   By: Jeannine Boga M.D.   On: 05/12/2016 02:26   Dg Chest 2 View  Result Date: 05/08/2016 CLINICAL DATA:  Follow-up of pneumonia. No current complaints. Former smoker. History of coronary artery disease. EXAM: CHEST  2 VIEW COMPARISON:  PA and lateral chest x-ray of April 15, 2016 FINDINGS: The lungs arm adequately inflated. The left  lateral parenchymal density is less conspicuous but has not cleared completely. There remain mildly increased perihilar lung markings on the right. There is stable mild cardiomegaly without pulmonary vascular congestion. There is calcification in the wall of the aortic arch. The bony thorax exhibits no acute abnormality. IMPRESSION: Slight further interval clearing of the peripheral infiltrate in the left lower lung laterally. Stable changes elsewhere. Given the patient's clinical improvement, an additional follow-up chest x-ray in 3-4 weeks is recommended. If the patient's symptoms worsen in the mean time, chest CT scanning would likely be the most useful next imaging step. Stable cardiomegaly.  Aortic atherosclerosis. Electronically Signed   By: David  Martinique M.D.   On: 05/08/2016 16:48   Dg Chest 2 View  Result Date: 04/15/2016 CLINICAL DATA:  Followup pneumonia EXAM: CHEST  2 VIEW COMPARISON:  03/17/2016 FINDINGS: Cardiac shadow is stable. The lungs are well aerated bilaterally. Patchy changes are noted along the pleural margin in the left lung base improved from the prior exam consistent with resolving pneumonia. No bony abnormality is seen. IMPRESSION: Improved infiltrate in the left base. Continued follow-up is recommended. Electronically Signed   By: Inez Catalina M.D.   On: 04/15/2016 08:49       Subjective:  Multiple bowel movements following bowel prep. No further bleeding.  Discharge Exam: Vitals:   05/13/16 1225 05/13/16 1306  BP: (!) 172/60 (!) 113/51  Pulse: 63 (!) 56  Resp: 17 14  Temp:     Vitals:   05/12/16 2014 05/13/16 0605 05/13/16 1225 05/13/16 1306  BP: (!) 117/58 (!) 155/64 (!) 172/60 (!) 113/51  Pulse: (!) 57 (!) 56 63 (!) 56  Resp: _0 Temp: 97.6 F (36.4 C) 97.8 F (36.6 C)    TempSrc: Oral Oral  Oral  SpO2: 99% 100% 100% 100%  Weight:      Height:        General: Elderly male not in distress HEENT: Pallor present, moist mucosa Chest: Clear to  auscultation bilaterally Cardiovascular: S1 and S2 irregular, no murmurs rub or gallop Abdominal: Soft, NT, ND, bowel sounds + Extremities: Warm, no edema CNS: Alert and oriented    The results of significant diagnostics from this hospitalization (including imaging, microbiology, ancillary and laboratory) are listed below for reference.     Microbiology: Recent Results (from the past 240 hour(s))  C difficile quick scan w PCR reflex     Status: Abnormal   Collection Time: 05/12/16  8:00 PM  Result Value Ref Range Status   C Diff antigen POSITIVE (A) NEGATIVE Final   C Diff toxin NEGATIVE NEGATIVE Final   C Diff interpretation Results are indeterminate. See PCR results.  Final  Clostridium Difficile by PCR     Status: Abnormal   Collection Time: 05/12/16  8:00 PM  Result Value Ref Range Status   Toxigenic C Difficile by pcr POSITIVE (A) NEGATIVE Final    Comment: Positive for toxigenic C. difficile with little to no toxin production. Only treat if clinical presentation suggests symptomatic illness. Performed at [facility]: BNP (last 3 results) No results for input(s): BNP in the last 8760 hours. Basic Metabolic Panel:  Recent Labs Lab 05/08/16 1615 05/11/16 1644 05/12/16 0616  NA 139 139 138  K 4.8 4.1 3.6  CL 104 107 104  CO2 _1 GLUCOSE 220* 255* 173*  BUN 25* 27* 25*  CREATININE 1.55* 1.52* 1.41*  CALCIUM 9.3 8.8* 8.7*   Liver Function Tests:  Recent Labs Lab 05/11/16 1644 05/12/16 0616  AST 27 26  ALT 25 21  ALKPHOS 36* 31*  BILITOT 0.5 0.9  PROT 6.8 6.9  ALBUMIN 4.1 4.0    Recent Labs Lab 05/11/16 1644  LIPASE 30   No results for input(s): AMMONIA in the last 168 hours. CBC:  Recent Labs Lab 05/08/16 1615 05/11/16 1644 05/12/16 0616 05/12/16 1049 05/12/16 1805 05/13/16 0151 05/13/16 1014  WBC 4.4 4.6 3.8* 3.1*  --   --   --   NEUTROABS 2.9  --   --   --   --   --   --   HGB 12.3* 11.3* 10.8* 10.2* 11.5* 10.7*  10.6*  HCT 34.3* 32.2* 32.0* 29.8* 33.7* 31.5* 32.0*  MCV 88.9 87.3 89.6 87.1  --   --   --   PLT 192.0 156 124* 116*  --   --   --    Cardiac Enzymes: No results for input(s): CKTOTAL, CKMB, CKMBINDEX, TROPONINI in the last 168 hours. BNP: Invalid input(s): POCBNP CBG:  Recent Labs Lab 05/12/16 2022 05/12/16 2352 05/13/16 0602 05/13/16 1027 05/13/16 1427  GLUCAP 175* 143* 206* 178* 177*   D-Dimer No results for input(s):  DDIMER in the last 72 hours. Hgb A1c No results for input(s): HGBA1C in the last 72 hours. Lipid Profile No results for input(s): CHOL, HDL, LDLCALC, TRIG, CHOLHDL, LDLDIRECT in the last 72 hours. Thyroid function studies No results for input(s): TSH, T4TOTAL, T3FREE, THYROIDAB in the last 72 hours.  Invalid input(s): FREET3 Anemia work up No results for input(s): VITAMINB12, FOLATE, FERRITIN, TIBC, IRON, RETICCTPCT in the last 72 hours. Urinalysis    Component Value Date/Time   COLORURINE YELLOW 05/11/2016 1600   APPEARANCEUR CLEAR 05/11/2016 1600   LABSPEC 1.021 05/11/2016 1600   PHURINE 5.5 05/11/2016 1600   GLUCOSEU NEGATIVE 05/11/2016 1600   GLUCOSEU NEGATIVE 11/07/2013 0754   HGBUR NEGATIVE 05/11/2016 1600   BILIRUBINUR NEGATIVE 05/11/2016 1600   BILIRUBINUR neg 03/08/2015 1105   KETONESUR NEGATIVE 05/11/2016 1600   PROTEINUR NEGATIVE 05/11/2016 1600   UROBILINOGEN negative 03/08/2015 1105   UROBILINOGEN 0.2 02/09/2014 1805   NITRITE NEGATIVE 05/11/2016 1600   LEUKOCYTESUR NEGATIVE 05/11/2016 1600   Sepsis Labs Invalid input(s): PROCALCITONIN,  WBC,  LACTICIDVEN Microbiology Recent Results (from the past 240 hour(s))  C difficile quick scan w PCR reflex     Status: Abnormal   Collection Time: 05/12/16  8:00 PM  Result Value Ref Range Status   C Diff antigen POSITIVE (A) NEGATIVE Final   C Diff toxin NEGATIVE NEGATIVE Final   C Diff interpretation Results are indeterminate. See PCR results.  Final  Clostridium Difficile by PCR      Status: Abnormal   Collection Time: 05/12/16  8:00 PM  Result Value Ref Range Status   Toxigenic C Difficile by pcr POSITIVE (A) NEGATIVE Final    Comment: Positive for toxigenic C. difficile with little to no toxin production. Only treat if clinical presentation suggests symptomatic illness. Performed at [facility]      Time coordinating discharge: < 30 minutes  SIGNED:   Louellen Molder, MD  [facility] 05/13/2016, 3:37 PM Pager   If 7PM-7AM, please contact night-coverage [email or web address] Password [email or web address]

## 2016-05-13 NOTE — ED Provider Notes (Signed)
Michael [name] from Dr. Leonette Monarch at shift change. Patient presents with rectal bleeding. He has a history of previous GI bleeds related to AVM at the distal small intestine. He is currently taking eliquis. His hemoglobin is down 1 g and CT scan of the abdomen and pelvis is negative. This patient was discussed with Dr. Havery Moros from gastroenterology who is recommended admission. He will be admitted to the hospitalist service under the care of Dr. Hal Hope.   Veryl Speak, MD 05/13/16 [phone]

## 2016-05-13 NOTE — Op Note (Signed)
[facility] Patient Name: Michael [name] Procedure Date: 05/13/2016 MRN: [id number] Attending MD: Carlota Raspberry. Latorria Zeoli MD, MD Date of Birth: 08/13/1940 CSN: [id number] Age: 75 Admit Type: Inpatient Procedure:                Colonoscopy Indications:              Evaluation of unexplained GI bleeding on Eliquis                            (last dose 36 hours ago), recent history of                            diarrhea with C Diff positive stools Providers:                Remo Lipps P. Joud Ingwersen MD, MD, Malka So, RN,                            William Dalton, Technician Referring MD:              Medicines:                Monitored Anesthesia Care Complications:            No immediate complications. Estimated blood loss:                            Minimal. Estimated Blood Loss:     Estimated blood loss was minimal. Procedure:                Pre-Anesthesia Assessment:                           - Prior to the procedure, a History and Physical                            was performed, and patient medications and                            allergies were reviewed. The patient's tolerance of                            previous anesthesia was also reviewed. The risks                            and benefits of the procedure and the sedation                            options and risks were discussed with the patient.                            All questions were answered, and informed consent                            was obtained. Prior Anticoagulants: The patient has  taken Eliquis (apixaban), last dose was 1 day prior                            to procedure. ASA Grade Assessment: III - A patient                            with severe systemic disease. After reviewing the                            risks and benefits, the patient was deemed in                            satisfactory condition to undergo the procedure.  After obtaining informed consent, the colonoscope                            was passed under direct vision. Throughout the                            procedure, the patient's blood pressure, pulse, and                            oxygen saturations were monitored continuously. The                            EC-[id number] ([id number]) scope was introduced through                            the anus and advanced to the the terminal ileum,                            with identification of the appendiceal orifice and                            IC valve. The colonoscopy was performed without                            difficulty. The patient tolerated the procedure                            well. The quality of the bowel preparation was                            adequate. The terminal ileum, ileocecal valve,                            appendiceal orifice, and rectum were photographed. Scope In: 12:36:36 PM Scope Out: 09:32:67 PM Scope Withdrawal Time: 0 hours 15 minutes 22 seconds  Total Procedure Duration: 0 hours 20 minutes 18 seconds  Findings:      The perianal exam findings include non-thrombosed external hemorrhoids /       skin tags      A large sessile polyp was found in the distal ascending colon / hepatic  flexure. The polyp was sessile. With contact with the endoscope and       forceps to evaluate the size and view it in entirety, it was slightly       friable in the setting of Eliquis use, but did not have any evidence       that it had been bleeding      Two sessile polyps were found in the ascending colon. The polyps were 4       to 7 mm in size.      A few sessile polyps were found in the sigmoid colon. The polyps were 3       to 6 mm in size.      The terminal ileum appeared normal. There was mild scratches along the       cecum / IC valve when intubating the ileum in the setting of Eliquis use.      Non-bleeding internal hemorrhoids were found during retroflexion. The        hemorrhoids were large and inflamed.      The exam was otherwise without abnormality. The prep was fair on       intubation however following time spent lavaging, adequate views were       obtained. No evidence of pseudomembranes. Impression:               - Non-thrombosed external hemorrhoids found on                            perianal exam.                           - One large polyp in the distal ascending colon.                           - Two 4 to 7 mm polyps in the ascending colon.                           - A few 3 to 6 mm polyps in the sigmoid colon.                           - The examined portion of the ileum was normal.                           - Non-bleeding large inflamed internal hemorrhoids.                           - The examination was otherwise normal.                           Overall, I suspect the patient has had hemorrhoidal                            bleeding in the setting of recent Eliquis use and                            diarrhea, presumably due to C Diff. Multiple polyps  seen today, including one large polyp, however                            these were not removed given the patient's Eliquis                            use (last dose 36 hours ago) which needs to be held                            3 days prior to polypectomy attempt with his GFR,                            given associated risk of bleeding. Moderate Sedation:      No moderate sedation, case performed with MAC Recommendation:           - Continue present medications including vancomycin                            for treatment of C diff given patient's diarrhea                            (although colon did not have evidence of                            pseudomembranes)                           - Regular diet                           - Daily fiber supplement, Anusol PRN, and Sitz                            baths for hemorrhoids                           -  Patient is stable for discharge later today if no                            further bleeding (he has not had bleeding since                            admission)                           - Hold Eliquis for a few more days until                            hemorrhoids better managed and C diff has begun                            treated                           - Repeat colonoscopy for polypectomy in upcoming  weeks as outpatient once through this acute issue.                            Eliquis would need to be held for 3 days prior, we                            will help coordinate (will be with Dr. [name],                            primary GI)                           Please call with questions / concerns.                           - Patient has a contact number available for                            emergencies. The signs and symptoms of potential                            delayed complications were discussed with the                            patient. Return to normal activities tomorrow.                            Written discharge instructions were provided to the                            patient. Procedure Code(s):        --- Professional ---                           [phone], Colonoscopy, flexible; diagnostic, including                            collection of specimen(s) by brushing or washing,                            when performed (separate procedure) Diagnosis Code(s):        --- Professional ---                           D12.2, Benign neoplasm of ascending colon                           D12.5, Benign neoplasm of sigmoid colon                           K64.4, Residual hemorrhoidal skin tags                           K64.8, Other hemorrhoids  K92.2, Gastrointestinal hemorrhage, unspecified CPT copyright 2016 [employer]. All rights reserved. The codes documented in this report are preliminary and  upon coder review may  be revised to meet current compliance requirements. Remo Lipps P. Shaine Mount MD, MD 05/13/2016 1:16:43 PM This report has been signed electronically. Number of Addenda: 0

## 2016-05-13 NOTE — Anesthesia Preprocedure Evaluation (Signed)
Anesthesia Evaluation  Patient identified by MRN, date of birth, ID band Patient awake    Reviewed: Allergy & Precautions, NPO status , Patient's Chart, lab work & pertinent test results  Airway Mallampati: II  TM Distance: >3 FB Neck ROM: Full    Dental no notable dental hx.    Pulmonary neg pulmonary ROS, former smoker,    Pulmonary exam normal breath sounds clear to auscultation       Cardiovascular hypertension, Normal cardiovascular exam+ dysrhythmias Atrial Fibrillation  Rhythm:Regular Rate:Normal     Neuro/Psych negative neurological ROS  negative psych ROS   GI/Hepatic Neg liver ROS, GERD  ,  Endo/Other  diabetesHypothyroidism   Renal/GU Renal InsufficiencyRenal disease  negative genitourinary   Musculoskeletal negative musculoskeletal ROS (+)   Abdominal   Peds negative pediatric ROS (+)  Hematology negative hematology ROS (+)   Anesthesia Other Findings   Reproductive/Obstetrics negative OB ROS                             Anesthesia Physical Anesthesia Plan  ASA: III  Anesthesia Plan: MAC   Post-op Pain Management:    Induction: Intravenous  Airway Management Planned: Nasal Cannula  Additional Equipment:   Intra-op Plan:   Post-operative Plan:   Informed Consent: I have reviewed the patients History and Physical, chart, labs and discussed the procedure including the risks, benefits and alternatives for the proposed anesthesia with the patient or authorized representative who has indicated his/her understanding and acceptance.   Dental advisory given  Plan Discussed with: CRNA and Surgeon  Anesthesia Plan Comments:         Anesthesia Quick Evaluation

## 2016-05-13 NOTE — H&P (View-Only) (Signed)
Referring Provider: EDP Primary Care Physician:  [name], MD Primary Gastroenterologist:  Dr. [name]  Reason for Consultation:  GIB   HPI: Michael [name] is a 75 y.o. male with history of IDA thought to be in part from small bowel AVMs, persistent atrial fibrillation (on Eliquis), systolic heart failure, diabetes mellitus, hyperlipidemia, and hypertension who presented to the ER because of rectal bleeding. Yesterday around 2 PM patient had a large bout of frank rectal bleeding at home. Patient had 2 further episodes in the ER. Each episode is associated with some crampy abdominal pain. Denies any nausea or vomiting. Patient is on apixaban for anticoagulation secondary to atrial fibrillation (last dose was given at 200 AM in the ED last night).  CT scan abdomen and pelvis without contrast showed no acute issues.  Lactic acid normal.  Hgb 11.3 grams, down from 12.3 grams three days prior.  Last colonoscopy 01/2011 by Dr. [name] was normal except for internal hemorrhoids.  Repeat recommended in 5 years due to history of adenomas in the past.  EGD 11/2011 was normal to D3 (by Dr. Henrene Pastor).  WCE in 12/2011 showed some small bowel AVM's.  Just of note, patient was hospitalized in August for CAP and was having a lot of diarrhea while admitted.  Cdiff Ag was positive but toxin was negative so he was not treated since it was documented that he did not have a BM in 24 hours.  He then also had a positive PCR, however.  Regardless, he was not treated and both the patient and his wife tell me that he had severe diarrhea in the hospital and has been having diarrhea at home since his discharge, sometimes 15+ times per day.     Past Medical History:  Diagnosis Date  . Allergy   . Arthritis   . AVM (arteriovenous malformation) of colon   . CAD (coronary artery disease)    a. Cath 10/2014 - Mild to moderate diagonal, circumflex and obtuse marginal disease. Innominate artery sternosis.  . Carotid  artery disease ([place])    a. Carotid dopple 03/2014 02-77% RICA &  41-28% LICA stenosis b. 7/86  . CKD (chronic kidney disease), stage III   . Dysrhythmia    ATRIAL FIBRILATION  . Elevated troponin    a. 09/2014 in setting of AF RVR-->Myoview: EF 49%, no ischemia/infarct->Med Rx.  . GERD (gastroesophageal reflux disease)   . Gout   . H/O transfusion of whole blood   . History of PFTs    PFTs 6/16:  FVC 3.73 (87%), FEV1 2.93 (88%), FEV1/FVC 78%, DLCO 69%  . Hyperlipidemia   . Hypertension   . Hypothyroidism   . Iron deficiency anemia   . PAF (paroxysmal atrial fibrillation) ([place])    a. 09/2014: Converted on Dilt;  b. CHA2DS2VASc = 3-->eliquis;  c. 09/2014 Echo: EF 55-60%, mild LVH, mildly dil LA.  [name] Personal history of colonic polyps 2007, 2008   adenoma each time, largest 12 mm in 2007  . Psoriasis   . Type II diabetes mellitus ([place])    TYPE 2    Past Surgical History:  Procedure Laterality Date  . APPENDECTOMY  02/09/2014  . COLONOSCOPY  02/10/2011   internal hemorrhoids  . COLONOSCOPY W/ POLYPECTOMY  04/09/2006   12 mm adenoma  . COLONOSCOPY W/ POLYPECTOMY  06/17/2007   5 mm adenoma  . ESOPHAGOGASTRODUODENOSCOPY  12/23/2011   Procedure: ESOPHAGOGASTRODUODENOSCOPY (EGD);  Surgeon: Irene Shipper, MD;  Location: [facility];  Service: Endoscopy;  Laterality: N/A;  with small bowel bx's  . GIVENS CAPSULE STUDY  12/28/2011  . KNEE ARTHROSCOPY     right  . LAPAROSCOPIC APPENDECTOMY N/A 02/09/2014   Procedure: APPENDECTOMY LAPAROSCOPIC;  Surgeon: Leighton Ruff, MD;  Location: [facility];  Service: General;  Laterality: N/A;  . LEFT HEART CATHETERIZATION WITH CORONARY ANGIOGRAM N/A 11/15/2014   Procedure: LEFT HEART CATHETERIZATION WITH CORONARY ANGIOGRAM;  Surgeon: Belva Crome, MD;  Location: [facility] CATH LAB;  Service: Cardiovascular;  Laterality: N/A;  . ROTATOR CUFF REPAIR     left    Prior to Admission medications   Medication Sig Start Date End Date Taking? Authorizing Provider    acetaminophen (TYLENOL) 650 MG CR tablet Take 1,300 mg by mouth every 8 (eight) hours as needed for pain.    Yes Historical Provider, MD  allopurinol (ZYLOPRIM) 100 MG tablet Take 2 tablets (200 mg total) by mouth daily. 02/03/16  Yes Evie Lacks [name], MD  amiodarone (PACERONE) 200 MG tablet Take 1 tablet (200 mg total) by mouth daily. 06/25/15  Yes Wellington Hampshire, MD  apixaban (ELIQUIS) 5 MG TABS tablet Take 1 tablet (5 mg total) by mouth 2 (two) times daily. 11/07/15  Yes Wellington Hampshire, MD  atorvastatin (LIPITOR) 40 MG tablet Take 1 tablet (40 mg total) by mouth daily. 03/09/16  Yes Josue Hector, MD  Cholecalciferol 1000 UNITS TBDP Take 1,000-5,000 Units by mouth daily. Takes 1000 units everyday except for on Saturday patient takes 5000 units   Yes Historical Provider, MD  diltiazem (CARDIZEM CD) 180 MG 24 hr capsule Take 1 capsule (180 mg total) by mouth daily. 12/16/15  Yes Wellington Hampshire, MD  fenofibrate 160 MG tablet TAKE 1 TABLET(160 MG) BY MOUTH DAILY 08/09/15  Yes Darlin Coco, MD  ferrous sulfate 325 (65 FE) MG tablet Take 325 mg by mouth 3 (three) times daily with meals.   Yes Historical Provider, MD  furosemide (LASIX) 20 MG tablet Take 1 tablet (20 mg total) by mouth daily. 12/16/15  Yes Evie Lacks [name], MD  glipiZIDE (GLUCOTROL) 10 MG tablet Take 10 mg by mouth daily. 05/06/16  Yes Historical Provider, MD  HUMIRA PEN 40 MG/0.8ML PNKT Inject 40 mg into the skin every 14 (fourteen) days. 06/18/15  Yes Historical Provider, MD  HYDROcodone-acetaminophen (NORCO/VICODIN) 5-325 MG tablet Take 1 tablet by mouth 2 (two) times daily as needed for moderate pain or severe pain. 12/16/15  Yes Evie Lacks [name], MD  Insulin Glargine (TOUJEO SOLOSTAR) 300 UNIT/ML SOPN Inject 50 Units into the skin daily. Titrate up by 1 unit every 1-2 days for goal sugars of 120-150 04/17/16 04/17/20 Yes Aleksei V [name], MD  levothyroxine (SYNTHROID, LEVOTHROID) 150 MCG tablet TAKE 1 TABLET BY MOUTH  DAILY BEFORE BREAKFAST 03/16/16  Yes Evie Lacks [name], MD  lisinopril (PRINIVIL,ZESTRIL) 20 MG tablet Take 1 tablet (20 mg total) by mouth every morning. 03/23/16  Yes Thurnell Lose, MD  metoprolol (LOPRESSOR) 50 MG tablet TAKE 1 TABLET(50 MG) BY MOUTH TWICE DAILY 11/15/15  Yes Wellington Hampshire, MD  omeprazole (PRILOSEC) 40 MG capsule TAKE 1 CAPSULE BY MOUTH EVERY DAY 05/04/16  Yes Cassandria Anger, MD  Blood Glucose Monitoring Suppl (ONE TOUCH ULTRA 2) W/DEVICE KIT Use as directed Dx E11.9 05/18/14   Cassandria Anger, MD  Blood Pressure Monitor KIT Use to check blood pressure daily Dx I10 10/04/15   Cassandria Anger, MD  ergocalciferol (VITAMIN D2) 50000 UNITS capsule Take 1 capsule (50,000 Units  total) by mouth once a week. Patient not taking: Reported on 05/11/2016 11/08/13   Evie Lacks [name], MD  glucose blood (ONE TOUCH ULTRA TEST) test strip 1 each by Other route 4 (four) times daily. Use to check blood sugar four times a day  Dx: E11.9- insulin Dependant 04/06/16   Evie Lacks [name], MD  Insulin Pen Needle 31G X 5 MM MISC 1 Units by Does not apply route 4 (four) times daily. 03/17/16   Cassandria Anger, MD  Lancets [facility] ULTRASOFT) lancets Use to check blood sugars daily 05/18/14   Cassandria Anger, MD    Current Facility-Administered Medications  Medication Dose Route Frequency Provider Last Rate Last Dose  . 0.9 %  sodium chloride infusion   Intravenous Continuous Rise Patience, MD      . allopurinol (ZYLOPRIM) tablet 200 mg  200 mg Oral Daily Rise Patience, MD      . amiodarone (PACERONE) tablet 200 mg  200 mg Oral Daily Rise Patience, MD      . atorvastatin (LIPITOR) tablet 40 mg  40 mg Oral q1800 Rise Patience, MD      . diltiazem (CARDIZEM CD) 24 hr capsule 180 mg  180 mg Oral Daily Rise Patience, MD      . famotidine (PEPCID) IVPB 20 mg premix  20 mg Intravenous Q12H Rise Patience, MD      . fenofibrate tablet 160 mg  160  mg Oral Daily Rise Patience, MD      . ferrous sulfate tablet 325 mg  325 mg Oral TID WC Rise Patience, MD      . hydrALAZINE (APRESOLINE) injection 10 mg  10 mg Intravenous Q4H PRN Rise Patience, MD      . insulin aspart (novoLOG) injection 0-9 Units  0-9 Units Subcutaneous Q4H Rise Patience, MD      . insulin glargine (LANTUS) injection 10 Units  10 Units Subcutaneous Daily Rise Patience, MD      . levothyroxine (SYNTHROID, LEVOTHROID) tablet 150 mcg  150 mcg Oral QAC breakfast Rise Patience, MD      . lisinopril (PRINIVIL,ZESTRIL) tablet 20 mg  20 mg Oral q morning - 10a Rise Patience, MD      . metoprolol (LOPRESSOR) tablet 50 mg  50 mg Oral BID Rise Patience, MD      . ondansetron [facility]) tablet 4 mg  4 mg Oral Q6H PRN Rise Patience, MD       Or  . ondansetron [facility]) injection 4 mg  4 mg Intravenous Q6H PRN Rise Patience, MD        Allergies as of 05/11/2016 - Review Complete 05/11/2016  Allergen Reaction Noted  . Percocet [oxycodone-acetaminophen] Nausea And Vomiting 01/27/2011  . Invokana [canagliflozin]  03/17/2016  . Metformin and related  03/17/2016    Family History  Problem Relation Age of Onset  . Heart attack Father   . Lung cancer Father   . Diabetes Father   . Stroke Father   . Hypertension Father   . Stroke Mother   . Hypertension Mother   . Cancer Brother     liver  . Heart disease Brother     chf  . COPD Sister   . Malignant hyperthermia Neg Hx     Social History   Social History  . Marital status: Married    Spouse name: N/A  . Number of children: N/A  .  Years of education: 22   Occupational History  .  Retired    Social History Main Topics  . Smoking status: Former Smoker    Packs/day: 0.70    Years: 48.00    Types: Cigarettes    Quit date: 03/29/2006  . Smokeless tobacco: Never Used  . Alcohol use 0.6 oz/week    1 Cans of beer per week     Comment: occasional alcohol intake  .  Drug use: No  . Sexual activity: Not Currently   Other Topics Concern  . Not on file   Social History Narrative   Regular exercise-no   Caffeine Use-yes    Review of Systems: Ten point ROS is O/W negative except as mentioned in HPI.  Physical Exam: Vital signs in last 24 hours: Temp:  [97.4 F (36.3 C)-98.2 F (36.8 C)] 97.4 F (36.3 C) (10/17 0815) Pulse Rate:  [51-67] 55 (10/17 0815) Resp:  [16-20] 20 (10/17 0815) BP: (139-216)/(57-74) 139/57 (10/17 0815) SpO2:  [97 %-100 %] 98 % (10/17 0815) Weight:  [255 lb 6.4 oz (115.8 kg)-256 lb 8 oz (116.3 kg)] 255 lb 6.4 oz (115.8 kg) (10/17 0815) Last BM Date: 05/12/16 General:  Alert, Well-developed, well-nourished, pleasant and cooperative in NAD Head:  Normocephalic and atraumatic. Eyes:  Sclera clear, no icterus.  Conjunctiva pink. Ears:  Normal auditory acuity. Mouth:  No deformity or lesions.   Lungs:  Clear throughout to auscultation.  No wheezes, crackles, or rhonchi.  Heart:  Irregularly irregular. Abdomen:  Soft, non-distended.  BS present.  Mild lower abdominal TTP. Rectal:  Deferred  Msk:  Symmetrical without gross deformities. Pulses:  Normal pulses noted. Extremities:  Without clubbing or edema. Neurologic:  Alert and oriented x 4;  grossly normal neurologically. Skin:  Intact without significant lesions or rashes. Psych:  Alert and cooperative. Normal mood and affect.  Lab Results:  Recent Labs  05/11/16 1644 05/12/16 0616  WBC 4.6 3.8*  HGB 11.3* 10.8*  HCT 32.2* 32.0*  PLT 156 124*   BMET  Recent Labs  05/11/16 1644 05/12/16 0616  NA 139 138  K 4.1 3.6  CL 107 104  CO2 24 27  GLUCOSE 255* 173*  BUN 27* 25*  CREATININE 1.52* 1.41*  CALCIUM 8.8* 8.7*   LFT  Recent Labs  05/12/16 0616  PROT 6.9  ALBUMIN 4.0  AST 26  ALT 21  ALKPHOS 31*  BILITOT 0.9   Studies/Results: Ct Abdomen Pelvis Wo Contrast  Result Date: 05/12/2016 CLINICAL DATA:  Initial evaluation for acute abdominal  pain, blood in stool. EXAM: CT ABDOMEN AND PELVIS WITHOUT CONTRAST TECHNIQUE: Multidetector CT imaging of the abdomen and pelvis was performed following the standard protocol without IV contrast. COMPARISON:  Prior CT from 02/09/2014. FINDINGS: Lower chest: Mild scattered atelectatic changes present within the visualized lung bases. Visualized lungs are otherwise clear. Hepatobiliary: The liver demonstrates a normal appearance. Multiple calcified stones present within the gallbladder lumen. No CT evidence for acute cholecystitis. No biliary dilatation. Pancreas: Pancreas within normal limits. Spleen: Spleen within normal limits. Adrenals/Urinary Tract: Adrenal glands are normal. Kidneys equal in size. No nephrolithiasis or hydronephrosis. Mild bilateral perinephric stranding. 11 mm hyperdense left renal lesion, indeterminate. Ureters of normal caliber without acute abnormality. Stomach/Bowel: Stomach within normal limits. No evidence for bowel obstruction. Appendix not visualize, compatible with prior appendectomy. No acute inflammatory changes about the bowels. Vascular/Lymphatic: Intra-abdominal aorta of normal caliber without evidence for aneurysm. Scattered atheromatous plaque within the aorta and its branch vessels. No  adenopathy. Reproductive: Prostate within normal limits. Other: No free air or fluid. Musculoskeletal: No acute osseous abnormality. No worrisome lytic or blastic osseous lesions. IMPRESSION: 1. No CT evidence for acute intra-abdominal or pelvic process. 2. Cholelithiasis. 3. 11 mm hyperdense left renal lesion. While this is felt to most likely reflect a small proteinaceous and/or hemorrhagic cyst, this is incompletely evaluated on this noncontrast examination. Further evaluation with dedicated renal mass protocol CT and/or MRI is recommended for full characterization. Electronically Signed   By: Jeannine Boga M.D.   On: 05/12/2016 02:26   IMPRESSION:  -75 year old male who presented  with rectal bleeding in the setting of anticoagulation with Eliquis for atrial fibrillation. -Rectal bleeding:  Is overdue for colonoscopy.  No evidence of diverticular disease on previous studies or imaging.  Patient reports diarrhea and small amounts of bleeding related to hemorrhoid irritation with that.  Also, patient was positive for Cdiff in August and was not treated.  Cdiff does not usually cause bleeding, but I suppose it is possible with ongoing, untreated infection. -Diarrhea:  Positive for Cdiff at hospitalization in August but not treated.  This has been severe,  15+ times per day at times. -Persistent atrial fibrillation, on Eliquis:  Last dose was today, 10/17 at 200 AM. -Chronic anemia:  Evaluated thoroughly from GI standpoint in the past.  Thought in part to be from small bowel AVM's.  Maintained on oral iron supplements at home.  Hgb stable in the setting of acute GI bleeding. -IDDM -Psoriasis and arthritis:  On Humira.  PLAN: -Discussed with Dr. Havery Moros, patient's nurse, patient, and wife/son.  Will plan to check Cdiff PCR.  If positive then will plan to treat that and observe for further bleeding.  If no further bleeding then will complete Cdiff treatment and can have outpatient colonoscopy in several weeks. -If Cdiff is negative then will proceed with colonoscopy +/- EGD on 10/18. -Monitor Hgb. -If re-bleeds briskly then may need nuc med bleeding scan.   Rosbel Buckner D.  05/12/2016, 9:28 AM  Pager number [phone]

## 2016-05-13 NOTE — Anesthesia Postprocedure Evaluation (Signed)
Anesthesia Post Note  Patient: Michael [name]  Procedure(s) Performed: Procedure(s) (LRB): COLONOSCOPY WITH PROPOFOL (N/A)  Patient location during evaluation: PACU Anesthesia Type: MAC Level of consciousness: awake and alert Pain management: pain level controlled Vital Signs Assessment: post-procedure vital signs reviewed and stable Respiratory status: spontaneous breathing, nonlabored ventilation, respiratory function stable and patient connected to nasal cannula oxygen Cardiovascular status: stable and blood pressure returned to baseline Anesthetic complications: no    Last Vitals:  Vitals:   05/13/16 1225 05/13/16 1306  BP: (!) 172/60 (!) 113/51  Pulse: 63 (!) 56  Resp: 17 14  Temp:      Last Pain:  Vitals:   05/13/16 1306  TempSrc: Oral  PainSc:                  Crawford Tamura S

## 2016-05-13 NOTE — Transfer of Care (Signed)
Immediate Anesthesia Transfer of Care Note  Patient: Michael [name]  Procedure(s) Performed: Procedure(s): COLONOSCOPY WITH PROPOFOL (N/A)  Patient Location: PACU and Endoscopy Unit  Anesthesia Type:MAC  Level of Consciousness: oriented, sedated and patient cooperative  Airway & Oxygen Therapy: Patient Spontanous Breathing and Patient connected to nasal cannula oxygen  Post-op Assessment: Report given to RN and Post -op Vital signs reviewed and stable  Post vital signs: Reviewed and stable  Last Vitals:  Vitals:   05/13/16 0605 05/13/16 1225  BP: (!) 155/64 (!) 172/60  Pulse: (!) 56 63  Resp: 20 17  Temp: 36.6 C     Last Pain:  Vitals:   05/13/16 0853  TempSrc:   PainSc: Asleep         Complications: No apparent anesthesia complications

## 2016-05-13 NOTE — Interval H&P Note (Signed)
History and Physical Interval Note:  05/13/2016 12:28 PM  Michael [name]  has presented today for surgery, with the diagnosis of GI bleeding  The various methods of treatment have been discussed with the patient and family. After consideration of risks, benefits and other options for treatment, the patient has consented to  Procedure(s): COLONOSCOPY WITH PROPOFOL (N/A) as a surgical intervention .  The patient's history has been reviewed, patient examined, no change in status, stable for surgery.  I have reviewed the patient's chart and labs.  Questions were answered to the patient's satisfaction.     [name]

## 2016-05-14 ENCOUNTER — Encounter (HOSPITAL_COMMUNITY): Payer: Self-pay | Admitting: Gastroenterology

## 2016-05-15 ENCOUNTER — Ambulatory Visit: Payer: Medicare Other | Admitting: Internal Medicine

## 2016-05-18 ENCOUNTER — Encounter: Payer: Self-pay | Admitting: Nurse Practitioner

## 2016-05-18 ENCOUNTER — Other Ambulatory Visit (INDEPENDENT_AMBULATORY_CARE_PROVIDER_SITE_OTHER): Payer: Medicare Other

## 2016-05-18 ENCOUNTER — Other Ambulatory Visit: Payer: Medicare Other

## 2016-05-18 ENCOUNTER — Ambulatory Visit (INDEPENDENT_AMBULATORY_CARE_PROVIDER_SITE_OTHER): Payer: Medicare Other | Admitting: Nurse Practitioner

## 2016-05-18 VITALS — BP 162/86 | HR 57 | Temp 97.6°F | Ht 71.0 in | Wt 259.0 lb

## 2016-05-18 DIAGNOSIS — R197 Diarrhea, unspecified: Secondary | ICD-10-CM | POA: Diagnosis not present

## 2016-05-18 DIAGNOSIS — D509 Iron deficiency anemia, unspecified: Secondary | ICD-10-CM

## 2016-05-18 DIAGNOSIS — K644 Residual hemorrhoidal skin tags: Secondary | ICD-10-CM

## 2016-05-18 DIAGNOSIS — I6523 Occlusion and stenosis of bilateral carotid arteries: Secondary | ICD-10-CM | POA: Diagnosis not present

## 2016-05-18 DIAGNOSIS — R42 Dizziness and giddiness: Secondary | ICD-10-CM

## 2016-05-18 LAB — CBC WITH DIFFERENTIAL/PLATELET
BASOS PCT: 0.3 % (ref 0.0–3.0)
Basophils Absolute: 0 10*3/uL (ref 0.0–0.1)
EOS PCT: 3.2 % (ref 0.0–5.0)
Eosinophils Absolute: 0.2 10*3/uL (ref 0.0–0.7)
HEMATOCRIT: 30.9 % — AB (ref 39.0–52.0)
HEMOGLOBIN: 10.9 g/dL — AB (ref 13.0–17.0)
LYMPHS PCT: 20.9 % (ref 12.0–46.0)
Lymphs Abs: 1 10*3/uL (ref 0.7–4.0)
MCHC: 35.3 g/dL (ref 30.0–36.0)
MCV: 88 fl (ref 78.0–100.0)
Monocytes Absolute: 0.4 10*3/uL (ref 0.1–1.0)
Monocytes Relative: 7.5 % (ref 3.0–12.0)
NEUTROS ABS: 3.2 10*3/uL (ref 1.4–7.7)
Neutrophils Relative %: 68.1 % (ref 43.0–77.0)
Platelets: 208 10*3/uL (ref 150.0–400.0)
RBC: 3.51 Mil/uL — ABNORMAL LOW (ref 4.22–5.81)
RDW: 16.1 % — AB (ref 11.5–15.5)
WBC: 4.7 10*3/uL (ref 4.0–10.5)

## 2016-05-18 LAB — BASIC METABOLIC PANEL
BUN: 26 mg/dL — AB (ref 6–23)
CALCIUM: 9.3 mg/dL (ref 8.4–10.5)
CO2: 27 mEq/L (ref 19–32)
CREATININE: 1.55 mg/dL — AB (ref 0.40–1.50)
Chloride: 106 mEq/L (ref 96–112)
GFR: 46.87 mL/min — AB (ref >60.00)
GLUCOSE: 127 mg/dL — AB (ref 70–99)
Potassium: 4.2 mEq/L (ref 3.5–5.1)
Sodium: 139 mEq/L (ref 135–145)

## 2016-05-18 NOTE — Patient Instructions (Addendum)
You will be called with appt for head Ct. Encourage adequate adequate oral hydration. Maintain current medications.  patient declines referral to dietician at this time. Encouraged patient to maintain heart healthy and low carb diet. Follow up with GI as scheduled Change positions slowly.  Return to opthalmologist for re eval of eyes due to worsening blurry vision.

## 2016-05-18 NOTE — Progress Notes (Signed)
Subjective:  Patient ID: Michael [name], male    DOB: [birth year]  Age: 75 y.o. MRN: [id number]  CC: Rectal Bleeding (follow up  " Pt stated his doing much better no more bleeding.) and Dizziness (Also, pt stated when walking feels light headed and vision )  Michael [name] was hospitalized for 2days. due to sudden onset of rectal bleeding. Discharged 05/13/16 diagnostic colonoscopy done and indicates polyps and hemorrhoids. It is suspected by Gi that bleeding was from hemorrhoids. Patient is scheduled to return to GI next month for repeat diagnostic colonoscopy and removal of polyps   Rectal Bleeding   The current episode started more than 1 week ago. The onset was sudden. The problem has been resolved. The patient is experiencing no pain. The stool is described as soft. Associated symptoms include hemorrhoids. Pertinent negatives include no anorexia, no fever, no abdominal pain, no nausea, no rectal pain, no vomiting, no hematuria, no chest pain, no headaches, no coughing and no rash. Recently, medical care has been given at another facility (hospitalization, diagnostic colonoscopy done). Services received include tests performed and medications given.  Dizziness  This is a recurrent problem. The current episode started more than 1 month ago. The problem occurs constantly. The problem has been gradually worsening. Associated symptoms include vertigo and a visual change. Pertinent negatives include no abdominal pain, anorexia, chest pain, chills, congestion, coughing, fever, headaches, nausea, neck pain, numbness, rash, swollen glands, urinary symptoms, vomiting or weakness. The symptoms are aggravated by bending, twisting and walking. He has tried nothing for the symptoms.   Diabetes: Admits to not being compliant with diet. Home blood sugar ranges from 200-300s. He declines referral to dietician.  Outpatient Medications Prior to Visit  Medication Sig Dispense Refill  . acetaminophen (TYLENOL) 650  MG CR tablet Take 1,300 mg by mouth every 8 (eight) hours as needed for pain.     [name] allopurinol (ZYLOPRIM) 100 MG tablet Take 2 tablets (200 mg total) by mouth daily. 60 tablet 11  . amiodarone (PACERONE) 200 MG tablet Take 1 tablet (200 mg total) by mouth daily. 30 tablet 11  . apixaban (ELIQUIS) 5 MG TABS tablet Take 1 tablet (5 mg total) by mouth 2 (two) times daily. 60 tablet 5  . atorvastatin (LIPITOR) 40 MG tablet Take 1 tablet (40 mg total) by mouth daily. 90 tablet 2  . Blood Glucose Monitoring Suppl (ONE TOUCH ULTRA 2) W/DEVICE KIT Use as directed Dx E11.9 1 each 0  . Blood Pressure Monitor KIT Use to check blood pressure daily Dx I10 1 each 0  . Cholecalciferol 1000 UNITS TBDP Take 1,000-5,000 Units by mouth daily. Takes 1000 units everyday except for on Saturday patient takes 5000 units    . diltiazem (CARDIZEM CD) 180 MG 24 hr capsule Take 1 capsule (180 mg total) by mouth daily. 30 capsule 10  . fenofibrate 160 MG tablet TAKE 1 TABLET(160 MG) BY MOUTH DAILY 30 tablet 11  . ferrous sulfate 325 (65 FE) MG tablet Take 325 mg by mouth 3 (three) times daily with meals.    . furosemide (LASIX) 20 MG tablet Take 1 tablet (20 mg total) by mouth daily. 90 tablet 3  . glipiZIDE (GLUCOTROL) 10 MG tablet Take 10 mg by mouth daily.    [name] glucose blood (ONE TOUCH ULTRA TEST) test strip 1 each by Other route 4 (four) times daily. Use to check blood sugar four times a day  Dx: E11.9- insulin Dependant 120 each 5  . HUMIRA  PEN 40 MG/0.8ML PNKT Inject 40 mg into the skin every 14 (fourteen) days.    [name] HYDROcodone-acetaminophen (NORCO/VICODIN) 5-325 MG tablet Take 1 tablet by mouth 2 (two) times daily as needed for moderate pain or severe pain. 100 tablet 0  . hydrocortisone (ANUSOL-HC) 25 MG suppository Place 1 suppository (25 mg total) rectally 2 (two) times daily. 12 suppository 0  . Insulin Glargine (TOUJEO SOLOSTAR) 300 UNIT/ML SOPN Inject 50 Units into the skin daily. Titrate up by 1 unit every  1-2 days for goal sugars of 120-150 6 mL 11  . Insulin Pen Needle 31G X 5 MM MISC 1 Units by Does not apply route 4 (four) times daily. 150 each 11  . Lancets (ONETOUCH ULTRASOFT) lancets Use to check blood sugars daily 30 each 11  . levothyroxine (SYNTHROID, LEVOTHROID) 150 MCG tablet TAKE 1 TABLET BY MOUTH DAILY BEFORE BREAKFAST 90 tablet 3  . lisinopril (PRINIVIL,ZESTRIL) 20 MG tablet Take 1 tablet (20 mg total) by mouth every morning. 90 tablet 3  . metoprolol (LOPRESSOR) 50 MG tablet TAKE 1 TABLET(50 MG) BY MOUTH TWICE DAILY 180 tablet 1  . omeprazole (PRILOSEC) 40 MG capsule TAKE 1 CAPSULE BY MOUTH EVERY DAY 90 capsule 3  . vancomycin (VANCOCIN) 50 mg/mL oral solution Take 2.5 mLs (125 mg total) by mouth every 6 (six) hours. 140 mL 0   No facility-administered medications prior to visit.     ROS See HPI  Objective:  BP (!) 162/86 (BP Location: Left Arm, Patient Position: Sitting, Cuff Size: Normal)   Pulse (!) 57   Temp 97.6 F (36.4 C)   Ht 5' 11"  (1.803 m)   Wt 259 lb (117.5 kg)   SpO2 97%   BMI 36.12 kg/m   BP Readings from Last 3 Encounters:  05/18/16 (!) 162/86  05/13/16 (!) 113/51  05/08/16 124/62    Wt Readings from Last 3 Encounters:  05/18/16 259 lb (117.5 kg)  05/12/16 255 lb 6.4 oz (115.8 kg)  05/08/16 256 lb 6.4 oz (116.3 kg)    Physical Exam  Constitutional: He is oriented to person, place, and time. No distress.  HENT:  Right Ear: Tympanic membrane, external ear and ear canal normal.  Left Ear: Tympanic membrane, external ear and ear canal normal.  Nose: Nose normal.  Mouth/Throat: Oropharynx is clear and moist. No oropharyngeal exudate.  Eyes: No scleral icterus.  Neck: Normal range of motion. Neck supple.  Cardiovascular: Normal rate, regular rhythm and intact distal pulses.   Murmur heard. Pulmonary/Chest: Effort normal and breath sounds normal.  Abdominal: Soft. Bowel sounds are normal. He exhibits no distension. There is no tenderness.    Musculoskeletal: He exhibits edema. He exhibits no tenderness.  Mild bilateral ankle edema (non pitting), no change per patient.  Lymphadenopathy:    He has no cervical adenopathy.  Neurological: He is alert and oriented to person, place, and time.  Skin: Skin is warm and dry. No rash noted.  Psychiatric: He has a normal mood and affect.  Vitals reviewed.   Lab Results  Component Value Date   WBC 4.7 05/18/2016   HGB 10.9 (L) 05/18/2016   HCT 30.9 (L) 05/18/2016   PLT 208.0 05/18/2016   GLUCOSE 127 (H) 05/18/2016   CHOL 100 03/20/2015   TRIG (H) 03/20/2015    795.0 Triglyceride is over 400; calculations on Lipids are invalid.   HDL 22.30 (L) 03/20/2015   LDLDIRECT 26.0 03/20/2015   LDLCALC UNABLE TO CALCULATE IF TRIGLYCERIDE OVER 400 mg/dL  10/12/2014   ALT 21 05/12/2016   AST 26 05/12/2016   NA 139 05/18/2016   K 4.2 05/18/2016   CL 106 05/18/2016   CREATININE 1.55 (H) 05/18/2016   BUN 26 (H) 05/18/2016   CO2 27 05/18/2016   TSH 1.89 09/17/2015   PSA 0.50 11/07/2013   INR 1.62 03/17/2016   HGBA1C 8.9 (H) 03/17/2016   MICROALBUR 1.2 05/16/2013    Ct Abdomen Pelvis Wo Contrast  Result Date: 05/12/2016 CLINICAL DATA:  Initial evaluation for acute abdominal pain, blood in stool. EXAM: CT ABDOMEN AND PELVIS WITHOUT CONTRAST TECHNIQUE: Multidetector CT imaging of the abdomen and pelvis was performed following the standard protocol without IV contrast. COMPARISON:  Prior CT from 02/09/2014. FINDINGS: Lower chest: Mild scattered atelectatic changes present within the visualized lung bases. Visualized lungs are otherwise clear. Hepatobiliary: The liver demonstrates a normal appearance. Multiple calcified stones present within the gallbladder lumen. No CT evidence for acute cholecystitis. No biliary dilatation. Pancreas: Pancreas within normal limits. Spleen: Spleen within normal limits. Adrenals/Urinary Tract: Adrenal glands are normal. Kidneys equal in size. No nephrolithiasis or  hydronephrosis. Mild bilateral perinephric stranding. 11 mm hyperdense left renal lesion, indeterminate. Ureters of normal caliber without acute abnormality. Stomach/Bowel: Stomach within normal limits. No evidence for bowel obstruction. Appendix not visualize, compatible with prior appendectomy. No acute inflammatory changes about the bowels. Vascular/Lymphatic: Intra-abdominal aorta of normal caliber without evidence for aneurysm. Scattered atheromatous plaque within the aorta and its branch vessels. No adenopathy. Reproductive: Prostate within normal limits. Other: No free air or fluid. Musculoskeletal: No acute osseous abnormality. No worrisome lytic or blastic osseous lesions. IMPRESSION: 1. No CT evidence for acute intra-abdominal or pelvic process. 2. Cholelithiasis. 3. 11 mm hyperdense left renal lesion. While this is felt to most likely reflect a small proteinaceous and/or hemorrhagic cyst, this is incompletely evaluated on this noncontrast examination. Further evaluation with dedicated renal mass protocol CT and/or MRI is recommended for full characterization. Electronically Signed   By: Jeannine Boga M.D.   On: 05/12/2016 02:26    Assessment & Plan:   Jadore was seen today for rectal bleeding and dizziness.  Diagnoses and all orders for this visit:  Dizziness of unknown cause -     CBC w/Diff; Future -     Basic Metabolic Panel (BMET); Future -     CT Head Wo Contrast; Future  Iron deficiency anemia, unspecified iron deficiency anemia type -     CBC w/Diff; Future  Bleeding external hemorrhoids -     CBC w/Diff; Future  Diarrhea, unspecified type -     CBC w/Diff; Future -     Basic Metabolic Panel (BMET); Future   I am having Mr. Borghi maintain his acetaminophen, ferrous sulfate, ONE TOUCH ULTRA 2, onetouch ultrasoft, Cholecalciferol, HUMIRA PEN, amiodarone, fenofibrate, Blood Pressure Monitor, metoprolol, furosemide, HYDROcodone-acetaminophen, diltiazem, allopurinol,  atorvastatin, levothyroxine, Insulin Pen Needle, lisinopril, glucose blood, Insulin Glargine, omeprazole, glipiZIDE, apixaban, hydrocortisone, and vancomycin.  No orders of the defined types were placed in this encounter.  Recent Results (from the past 2160 hour(s))  Urea nitrogen, urine     Status: None   Collection Time: 03/17/16 12:03 AM  Result Value Ref Range   Urea Nitrogen, Ur 1,225 Not Estab. mg/dL    Comment: (NOTE) Performed At: [facility] [place], [place] [id number] Lindon Romp MD [id number]   Legionella Pneumophila Serogp 1 Ur Ag     Status: None   Collection Time: 03/17/16 12:03 AM  Result Value Ref Range   L. pneumophila Serogp 1 Ur Ag Negative Negative    Comment: (NOTE) Presumptive negative for L. pneumophila serogroup 1 antigen in urine, suggesting no recent or current infection. Legionnaires' disease cannot be ruled out since other serogroups and species may also cause disease. Performed At: [facility] [place], [place] [id number] Lindon Romp MD [id number]    Source of Sample URINE, RANDOM   Hemoglobin A1c     Status: Abnormal   Collection Time: 03/17/16 10:34 AM  Result Value Ref Range   Hgb A1c MFr Bld 8.9 (H) 4.6 - 6.5 %    Comment: Glycemic Control Guidelines for People with Diabetes:Non Diabetic:  <6%Goal of Therapy: <7%Additional Action Suggested:  >6%   Basic metabolic panel     Status: Abnormal   Collection Time: 03/17/16 10:34 AM  Result Value Ref Range   Sodium 128 (L) 135 - 145 mEq/L   Potassium 5.0 3.5 - 5.1 mEq/L   Chloride 95 (L) 96 - 112 mEq/L   CO2 25 19 - 32 mEq/L   Glucose, Bld 340 (H) 70 - 99 mg/dL   BUN 32 (H) 6 - 23 mg/dL   Creatinine, Ser 1.88 (H) 0.40 - 1.50 mg/dL   Calcium 8.6 8.4 - 10.5 mg/dL   GFR 37.53 (L) >60.00 mL/min  CBC with Differential/Platelet     Status: Abnormal   Collection Time: 03/17/16 10:34 AM  Result Value Ref Range   WBC 10.8 (H) 4.0 - 10.5  K/uL   RBC 3.55 (L) 4.22 - 5.81 Mil/uL   Hemoglobin 10.8 (L) 13.0 - 17.0 g/dL   HCT 30.9 (L) 39.0 - 52.0 %   MCV 87.3 78.0 - 100.0 fl   MCHC 34.8 30.0 - 36.0 g/dL   RDW 16.2 (H) 11.5 - 15.5 %   Platelets 180.0 150.0 - 400.0 K/uL   Neutrophils Relative % 86.5 Repeated and verified X2. (H) 43.0 - 77.0 %   Lymphocytes Relative 5.6 Repeated and verified X2. (L) 12.0 - 46.0 %   Monocytes Relative 7.4 3.0 - 12.0 %   Eosinophils Relative 0.2 0.0 - 5.0 %   Basophils Relative 0.3 0.0 - 3.0 %   Neutro Abs 9.4 (H) 1.4 - 7.7 K/uL   Lymphs Abs 0.6 (L) 0.7 - 4.0 K/uL   Monocytes Absolute 0.8 0.1 - 1.0 K/uL   Eosinophils Absolute 0.0 0.0 - 0.7 K/uL   Basophils Absolute 0.0 0.0 - 0.1 K/uL  IBC panel     Status: Abnormal   Collection Time: 03/17/16 10:34 AM  Result Value Ref Range   Iron 21 (L) 42 - 165 ug/dL   Transferrin 216.0 212.0 - 360.0 mg/dL   Saturation Ratios 6.9 (L) 20.0 - 76.7 %  Basic metabolic panel     Status: Abnormal   Collection Time: 03/17/16  4:34 PM  Result Value Ref Range   Sodium 131 (L) 135 - 145 mmol/L   Potassium 4.5 3.5 - 5.1 mmol/L   Chloride 98 (L) 101 - 111 mmol/L   CO2 24 22 - 32 mmol/L   Glucose, Bld 280 (H) 65 - 99 mg/dL   BUN 32 (H) 6 - 20 mg/dL   Creatinine, Ser 1.95 (H) 0.61 - 1.24 mg/dL   Calcium 8.7 (L) 8.9 - 10.3 mg/dL   GFR calc non Af Amer 32 (L) >60 mL/min   GFR calc Af Amer 38 (L) >60 mL/min    Comment: (NOTE) The eGFR has been calculated using  the CKD EPI equation. This calculation has not been validated in all clinical situations. eGFR's persistently <60 mL/min signify possible Chronic Kidney Disease.    Anion gap 9 5 - 15  CBC     Status: Abnormal   Collection Time: 03/17/16  4:34 PM  Result Value Ref Range   WBC 9.0 4.0 - 10.5 K/uL   RBC 3.62 (L) 4.22 - 5.81 MIL/uL   Hemoglobin 10.8 (L) 13.0 - 17.0 g/dL   HCT 32.3 (L) 39.0 - 52.0 %   MCV 89.2 78.0 - 100.0 fL   MCH 29.8 26.0 - 34.0 pg   MCHC 33.4 30.0 - 36.0 g/dL   RDW 15.6 (H) 11.5 -  15.5 %   Platelets 150 150 - 400 K/uL  I-stat troponin, ED     Status: None   Collection Time: 03/17/16  4:58 PM  Result Value Ref Range   Troponin i, poc 0.00 0.00 - 0.08 ng/mL   Comment 3            Comment: Due to the release kinetics of cTnI, a negative result within the first hours of the onset of symptoms does not rule out myocardial infarction with certainty. If myocardial infarction is still suspected, repeat the test at appropriate intervals.   I-Stat CG4 Lactic Acid, ED     Status: None   Collection Time: 03/17/16  4:58 PM  Result Value Ref Range   Lactic Acid, Venous 1.90 0.5 - 1.9 mmol/L  Blood culture (routine x 2)     Status: None   Collection Time: 03/17/16  8:13 PM  Result Value Ref Range   Specimen Description BLOOD RIGHT ANTECUBITAL    Special Requests BOTTLES DRAWN AEROBIC AND ANAEROBIC 5CC    Culture NO GROWTH 5 DAYS    Report Status 03/22/2016 FINAL   Blood culture (routine x 2)     Status: None   Collection Time: 03/17/16  8:19 PM  Result Value Ref Range   Specimen Description BLOOD LEFT ANTECUBITAL    Special Requests BOTTLES DRAWN AEROBIC AND ANAEROBIC 5CC    Culture NO GROWTH 5 DAYS    Report Status 03/22/2016 FINAL   I-Stat CG4 Lactic Acid, ED     Status: None   Collection Time: 03/17/16  8:33 PM  Result Value Ref Range   Lactic Acid, Venous 1.52 0.5 - 1.9 mmol/L  Procalcitonin     Status: None   Collection Time: 03/17/16  9:11 PM  Result Value Ref Range   Procalcitonin 0.45 ng/mL    Comment:        Interpretation: PCT (Procalcitonin) <= 0.5 ng/mL: Systemic infection (sepsis) is not likely. Local bacterial infection is possible. (NOTE)         ICU PCT Algorithm               Non ICU PCT Algorithm    ----------------------------     ------------------------------         PCT < 0.25 ng/mL                 PCT < 0.1 ng/mL     Stopping of antibiotics            Stopping of antibiotics       strongly encouraged.               strongly  encouraged.    ----------------------------     ------------------------------       PCT level decrease by  PCT < 0.25 ng/mL       >= 80% from peak PCT       OR PCT 0.25 - 0.5 ng/mL          Stopping of antibiotics                                             encouraged.     Stopping of antibiotics           encouraged.    ----------------------------     ------------------------------       PCT level decrease by              PCT >= 0.25 ng/mL       < 80% from peak PCT        AND PCT >= 0.5 ng/mL            Continuin g antibiotics                                              encouraged.       Continuing antibiotics            encouraged.    ----------------------------     ------------------------------     PCT level increase compared          PCT > 0.5 ng/mL         with peak PCT AND          PCT >= 0.5 ng/mL             Escalation of antibiotics                                          strongly encouraged.      Escalation of antibiotics        strongly encouraged.   Protime-INR     Status: Abnormal   Collection Time: 03/17/16  9:11 PM  Result Value Ref Range   Prothrombin Time 19.4 (H) 11.4 - 15.2 seconds   INR 1.62   APTT     Status: Abnormal   Collection Time: 03/17/16  9:11 PM  Result Value Ref Range   aPTT 46 (H) 24 - 36 seconds    Comment:        IF BASELINE aPTT IS ELEVATED, SUGGEST PATIENT RISK ASSESSMENT BE USED TO DETERMINE APPROPRIATE ANTICOAGULANT THERAPY.   Lactic acid, plasma     Status: None   Collection Time: 03/17/16  9:21 PM  Result Value Ref Range   Lactic Acid, Venous 1.5 0.5 - 1.9 mmol/L  Glucose, capillary     Status: Abnormal   Collection Time: 03/17/16 10:34 PM  Result Value Ref Range   Glucose-Capillary 239 (H) 65 - 99 mg/dL  Lactic acid, plasma     Status: None   Collection Time: 03/17/16 11:45 PM  Result Value Ref Range   Lactic Acid, Venous 1.4 0.5 - 1.9 mmol/L  Creatinine, urine, random     Status: None   Collection Time:  03/17/16 11:59 PM  Result Value Ref Range   Creatinine, Urine 149.32 mg/dL  Strep pneumoniae urinary antigen     Status:  None   Collection Time: 03/17/16 11:59 PM  Result Value Ref Range   Strep Pneumo Urinary Antigen NEGATIVE NEGATIVE    Comment:        Infection due to S. pneumoniae cannot be absolutely ruled out since the antigen present may be below the detection limit of the test.   Glucose, capillary     Status: Abnormal   Collection Time: 03/18/16  8:08 AM  Result Value Ref Range   Glucose-Capillary 254 (H) 65 - 99 mg/dL  CBC     Status: Abnormal   Collection Time: 03/18/16  9:15 AM  Result Value Ref Range   WBC 7.5 4.0 - 10.5 K/uL   RBC 3.03 (L) 4.22 - 5.81 MIL/uL   Hemoglobin 8.9 (L) 13.0 - 17.0 g/dL   HCT 26.8 (L) 39.0 - 52.0 %   MCV 88.4 78.0 - 100.0 fL   MCH 29.4 26.0 - 34.0 pg   MCHC 33.2 30.0 - 36.0 g/dL   RDW 15.8 (H) 11.5 - 15.5 %   Platelets 124 (L) 150 - 400 K/uL  Basic metabolic panel     Status: Abnormal   Collection Time: 03/18/16  9:15 AM  Result Value Ref Range   Sodium 130 (L) 135 - 145 mmol/L   Potassium 3.9 3.5 - 5.1 mmol/L   Chloride 101 101 - 111 mmol/L   CO2 22 22 - 32 mmol/L   Glucose, Bld 260 (H) 65 - 99 mg/dL   BUN 33 (H) 6 - 20 mg/dL   Creatinine, Ser 2.04 (H) 0.61 - 1.24 mg/dL   Calcium 7.9 (L) 8.9 - 10.3 mg/dL   GFR calc non Af Amer 31 (L) >60 mL/min   GFR calc Af Amer 36 (L) >60 mL/min    Comment: (NOTE) The eGFR has been calculated using the CKD EPI equation. This calculation has not been validated in all clinical situations. eGFR's persistently <60 mL/min signify possible Chronic Kidney Disease.    Anion gap 7 5 - 15  Glucose, capillary     Status: Abnormal   Collection Time: 03/18/16 11:31 AM  Result Value Ref Range   Glucose-Capillary 260 (H) 65 - 99 mg/dL  Glucose, capillary     Status: Abnormal   Collection Time: 03/18/16  4:45 PM  Result Value Ref Range   Glucose-Capillary 228 (H) 65 - 99 mg/dL  Glucose, capillary      Status: Abnormal   Collection Time: 03/18/16  8:34 PM  Result Value Ref Range   Glucose-Capillary 258 (H) 65 - 99 mg/dL  Basic metabolic panel     Status: Abnormal   Collection Time: 03/19/16  7:29 AM  Result Value Ref Range   Sodium 132 (L) 135 - 145 mmol/L   Potassium 4.4 3.5 - 5.1 mmol/L   Chloride 102 101 - 111 mmol/L   CO2 22 22 - 32 mmol/L   Glucose, Bld 217 (H) 65 - 99 mg/dL   BUN 29 (H) 6 - 20 mg/dL   Creatinine, Ser 1.85 (H) 0.61 - 1.24 mg/dL   Calcium 7.7 (L) 8.9 - 10.3 mg/dL   GFR calc non Af Amer 34 (L) >60 mL/min   GFR calc Af Amer 40 (L) >60 mL/min    Comment: (NOTE) The eGFR has been calculated using the CKD EPI equation. This calculation has not been validated in all clinical situations. eGFR's persistently <60 mL/min signify possible Chronic Kidney Disease.    Anion gap 8 5 - 15  Glucose, capillary  Status: Abnormal   Collection Time: 03/19/16  8:13 AM  Result Value Ref Range   Glucose-Capillary 213 (H) 65 - 99 mg/dL  Glucose, capillary     Status: Abnormal   Collection Time: 03/19/16 12:08 PM  Result Value Ref Range   Glucose-Capillary 288 (H) 65 - 99 mg/dL  Glucose, capillary     Status: Abnormal   Collection Time: 03/19/16  4:32 PM  Result Value Ref Range   Glucose-Capillary 233 (H) 65 - 99 mg/dL  C difficile quick scan w PCR reflex     Status: Abnormal   Collection Time: 03/19/16  7:46 PM  Result Value Ref Range   C Diff antigen POSITIVE (A) NEGATIVE   C Diff toxin NEGATIVE NEGATIVE   C Diff interpretation Results are indeterminate. See PCR results.   Clostridium Difficile by PCR     Status: Abnormal   Collection Time: 03/19/16  7:46 PM  Result Value Ref Range   Toxigenic C Difficile by pcr POSITIVE (A) NEGATIVE    Comment: Positive for toxigenic C. difficile with little to no toxin production. Only treat if clinical presentation suggests symptomatic illness.  Glucose, capillary     Status: Abnormal   Collection Time: 03/19/16  8:27 PM  Result  Value Ref Range   Glucose-Capillary 295 (H) 65 - 99 mg/dL  CBC     Status: Abnormal   Collection Time: 03/20/16  5:42 AM  Result Value Ref Range   WBC 3.7 (L) 4.0 - 10.5 K/uL   RBC 2.77 (L) 4.22 - 5.81 MIL/uL   Hemoglobin 8.2 (L) 13.0 - 17.0 g/dL   HCT 24.4 (L) 39.0 - 52.0 %   MCV 88.1 78.0 - 100.0 fL   MCH 29.6 26.0 - 34.0 pg   MCHC 33.6 30.0 - 36.0 g/dL   RDW 16.0 (H) 11.5 - 15.5 %   Platelets 156 150 - 400 K/uL  Basic metabolic panel     Status: Abnormal   Collection Time: 03/20/16  5:42 AM  Result Value Ref Range   Sodium 134 (L) 135 - 145 mmol/L   Potassium 4.1 3.5 - 5.1 mmol/L   Chloride 104 101 - 111 mmol/L   CO2 22 22 - 32 mmol/L   Glucose, Bld 219 (H) 65 - 99 mg/dL   BUN 22 (H) 6 - 20 mg/dL   Creatinine, Ser 1.58 (H) 0.61 - 1.24 mg/dL   Calcium 7.9 (L) 8.9 - 10.3 mg/dL   GFR calc non Af Amer 42 (L) >60 mL/min   GFR calc Af Amer 48 (L) >60 mL/min    Comment: (NOTE) The eGFR has been calculated using the CKD EPI equation. This calculation has not been validated in all clinical situations. eGFR's persistently <60 mL/min signify possible Chronic Kidney Disease.    Anion gap 8 5 - 15  Glucose, capillary     Status: Abnormal   Collection Time: 03/20/16  8:03 AM  Result Value Ref Range   Glucose-Capillary 217 (H) 65 - 99 mg/dL  Glucose, capillary     Status: Abnormal   Collection Time: 03/20/16 12:17 PM  Result Value Ref Range   Glucose-Capillary 258 (H) 65 - 99 mg/dL  Glucose, capillary     Status: Abnormal   Collection Time: 03/20/16  4:13 PM  Result Value Ref Range   Glucose-Capillary 255 (H) 65 - 99 mg/dL  Glucose, capillary     Status: Abnormal   Collection Time: 03/20/16  8:02 PM  Result Value Ref Range  Glucose-Capillary 305 (H) 65 - 99 mg/dL  Glucose, capillary     Status: Abnormal   Collection Time: 03/21/16  8:42 AM  Result Value Ref Range   Glucose-Capillary 233 (H) 65 - 99 mg/dL  Glucose, capillary     Status: Abnormal   Collection Time: 03/21/16  11:23 AM  Result Value Ref Range   Glucose-Capillary 301 (H) 65 - 99 mg/dL  Basic metabolic panel     Status: Abnormal   Collection Time: 04/15/16  8:25 AM  Result Value Ref Range   Sodium 134 (L) 135 - 145 mEq/L   Potassium 5.0 3.5 - 5.1 mEq/L   Chloride 99 96 - 112 mEq/L   CO2 28 19 - 32 mEq/L   Glucose, Bld 321 (H) 70 - 99 mg/dL   BUN 27 (H) 6 - 23 mg/dL   Creatinine, Ser 1.56 (H) 0.40 - 1.50 mg/dL   Calcium 9.0 8.4 - 10.5 mg/dL   GFR 46.53 (L) >60.00 mL/min  CBC with Differential/Platelet     Status: Abnormal   Collection Time: 05/08/16  4:15 PM  Result Value Ref Range   WBC 4.4 4.0 - 10.5 K/uL   RBC 3.86 (L) 4.22 - 5.81 Mil/uL   Hemoglobin 12.3 (L) 13.0 - 17.0 g/dL   HCT 34.3 (L) 39.0 - 52.0 %   MCV 88.9 78.0 - 100.0 fl   MCHC 35.9 30.0 - 36.0 g/dL   RDW 17.0 (H) 11.5 - 15.5 %   Platelets 192.0 150.0 - 400.0 K/uL   Neutrophils Relative % 65.8 43.0 - 77.0 %   Lymphocytes Relative 23.7 12.0 - 46.0 %   Monocytes Relative 6.8 3.0 - 12.0 %   Eosinophils Relative 3.3 0.0 - 5.0 %   Basophils Relative 0.4 0.0 - 3.0 %   Neutro Abs 2.9 1.4 - 7.7 K/uL   Lymphs Abs 1.0 0.7 - 4.0 K/uL   Monocytes Absolute 0.3 0.1 - 1.0 K/uL   Eosinophils Absolute 0.1 0.0 - 0.7 K/uL   Basophils Absolute 0.0 0.0 - 0.1 K/uL  Sedimentation rate     Status: None   Collection Time: 05/08/16  4:15 PM  Result Value Ref Range   Sed Rate 3 0 - 20 mm/hr  Basic metabolic panel     Status: Abnormal   Collection Time: 05/08/16  4:15 PM  Result Value Ref Range   Sodium 139 135 - 145 mEq/L   Potassium 4.8 3.5 - 5.1 mEq/L   Chloride 104 96 - 112 mEq/L   CO2 30 19 - 32 mEq/L   Glucose, Bld 220 (H) 70 - 99 mg/dL   BUN 25 (H) 6 - 23 mg/dL   Creatinine, Ser 1.55 (H) 0.40 - 1.50 mg/dL   Calcium 9.3 8.4 - 10.5 mg/dL   GFR 46.87 (L) >60.00 mL/min  D-Dimer, Quantitative     Status: None   Collection Time: 05/08/16  4:15 PM  Result Value Ref Range   D-Dimer, Quant <0.19 <0.50 mcg/mL FEU    Comment:   The  D-Dimer test is used frequently to exclude an acute PE or DVT.  In patients with a low to moderate clinical risk assessment and a D-Dimer result <0.50 mcg/mL FEU, the likelihood of a PE or DVT is very low.  However, a thromboembolic event should not be excluded solely on the basis of the D-Dimer level.  Increased levels of D-Dimer are associated with a PE, DVT, DIC, malignancies, inflammation, sepsis, surgery, trauma, pregnancy, and advancing patient age. [facility] 2006  70:488(8): 916-945]   For additional information, please refer to: http://education.questdiagn[employer].com/faq/FAQ149 (This link is being provided for information/ educational purposes only)     Urinalysis, Routine w reflex microscopic     Status: None   Collection Time: 05/11/16  4:00 PM  Result Value Ref Range   Color, Urine YELLOW YELLOW   APPearance CLEAR CLEAR   Specific Gravity, Urine 1.021 1.005 - 1.030   pH 5.5 5.0 - 8.0   Glucose, UA NEGATIVE NEGATIVE mg/dL   Hgb urine dipstick NEGATIVE NEGATIVE   Bilirubin Urine NEGATIVE NEGATIVE   Ketones, ur NEGATIVE NEGATIVE mg/dL   Protein, ur NEGATIVE NEGATIVE mg/dL   Nitrite NEGATIVE NEGATIVE   Leukocytes, UA NEGATIVE NEGATIVE    Comment: MICROSCOPIC NOT DONE ON URINES WITH NEGATIVE PROTEIN, BLOOD, LEUKOCYTES, NITRITE, OR GLUCOSE <1000 mg/dL.  Lipase, blood     Status: None   Collection Time: 05/11/16  4:44 PM  Result Value Ref Range   Lipase 30 11 - 51 U/L  Comprehensive metabolic panel     Status: Abnormal   Collection Time: 05/11/16  4:44 PM  Result Value Ref Range   Sodium 139 135 - 145 mmol/L   Potassium 4.1 3.5 - 5.1 mmol/L   Chloride 107 101 - 111 mmol/L   CO2 24 22 - 32 mmol/L   Glucose, Bld 255 (H) 65 - 99 mg/dL   BUN 27 (H) 6 - 20 mg/dL   Creatinine, Ser 1.52 (H) 0.61 - 1.24 mg/dL   Calcium 8.8 (L) 8.9 - 10.3 mg/dL   Total Protein 6.8 6.5 - 8.1 g/dL   Albumin 4.1 3.5 - 5.0 g/dL   AST 27 15 - 41 U/L   ALT 25 17 - 63 U/L   Alkaline Phosphatase 36  (L) 38 - 126 U/L   Total Bilirubin 0.5 0.3 - 1.2 mg/dL   GFR calc non Af Amer 44 (L) >60 mL/min   GFR calc Af Amer 51 (L) >60 mL/min    Comment: (NOTE) The eGFR has been calculated using the CKD EPI equation. This calculation has not been validated in all clinical situations. eGFR's persistently <60 mL/min signify possible Chronic Kidney Disease.    Anion gap 8 5 - 15  CBC     Status: Abnormal   Collection Time: 05/11/16  4:44 PM  Result Value Ref Range   WBC 4.6 4.0 - 10.5 K/uL   RBC 3.69 (L) 4.22 - 5.81 MIL/uL   Hemoglobin 11.3 (L) 13.0 - 17.0 g/dL   HCT 32.2 (L) 39.0 - 52.0 %   MCV 87.3 78.0 - 100.0 fL   MCH 30.6 26.0 - 34.0 pg   MCHC 35.1 30.0 - 36.0 g/dL   RDW 15.4 11.5 - 15.5 %   Platelets 156 150 - 400 K/uL  Glucose, capillary     Status: Abnormal   Collection Time: 05/12/16  5:36 AM  Result Value Ref Range   Glucose-Capillary 163 (H) 65 - 99 mg/dL  CBC     Status: Abnormal   Collection Time: 05/12/16  6:16 AM  Result Value Ref Range   WBC 3.8 (L) 4.0 - 10.5 K/uL   RBC 3.57 (L) 4.22 - 5.81 MIL/uL   Hemoglobin 10.8 (L) 13.0 - 17.0 g/dL   HCT 32.0 (L) 39.0 - 52.0 %   MCV 89.6 78.0 - 100.0 fL   MCH 30.3 26.0 - 34.0 pg   MCHC 33.8 30.0 - 36.0 g/dL   RDW 15.6 (H) 11.5 - 15.5 %   Platelets  124 (L) 150 - 400 K/uL  Comprehensive metabolic panel     Status: Abnormal   Collection Time: 05/12/16  6:16 AM  Result Value Ref Range   Sodium 138 135 - 145 mmol/L   Potassium 3.6 3.5 - 5.1 mmol/L   Chloride 104 101 - 111 mmol/L   CO2 27 22 - 32 mmol/L   Glucose, Bld 173 (H) 65 - 99 mg/dL   BUN 25 (H) 6 - 20 mg/dL   Creatinine, Ser 1.41 (H) 0.61 - 1.24 mg/dL   Calcium 8.7 (L) 8.9 - 10.3 mg/dL   Total Protein 6.9 6.5 - 8.1 g/dL   Albumin 4.0 3.5 - 5.0 g/dL   AST 26 15 - 41 U/L   ALT 21 17 - 63 U/L   Alkaline Phosphatase 31 (L) 38 - 126 U/L   Total Bilirubin 0.9 0.3 - 1.2 mg/dL   GFR calc non Af Amer 48 (L) >60 mL/min   GFR calc Af Amer 56 (L) >60 mL/min    Comment:  (NOTE) The eGFR has been calculated using the CKD EPI equation. This calculation has not been validated in all clinical situations. eGFR's persistently <60 mL/min signify possible Chronic Kidney Disease.    Anion gap 7 5 - 15  Lactic acid, plasma     Status: None   Collection Time: 05/12/16  6:16 AM  Result Value Ref Range   Lactic Acid, Venous 1.1 0.5 - 1.9 mmol/L  CBC     Status: Abnormal   Collection Time: 05/12/16 10:49 AM  Result Value Ref Range   WBC 3.1 (L) 4.0 - 10.5 K/uL   RBC 3.42 (L) 4.22 - 5.81 MIL/uL   Hemoglobin 10.2 (L) 13.0 - 17.0 g/dL   HCT 29.8 (L) 39.0 - 52.0 %   MCV 87.1 78.0 - 100.0 fL   MCH 29.8 26.0 - 34.0 pg   MCHC 34.2 30.0 - 36.0 g/dL   RDW 15.5 11.5 - 15.5 %   Platelets 116 (L) 150 - 400 K/uL    Comment: SPECIMEN CHECKED FOR CLOTS REPEATED TO VERIFY PLATELET COUNT CONFIRMED BY SMEAR   Glucose, capillary     Status: Abnormal   Collection Time: 05/12/16  3:56 PM  Result Value Ref Range   Glucose-Capillary 204 (H) 65 - 99 mg/dL   Comment 1 Notify RN    Comment 2 Document in Chart   Hemoglobin and hematocrit, blood     Status: Abnormal   Collection Time: 05/12/16  6:05 PM  Result Value Ref Range   Hemoglobin 11.5 (L) 13.0 - 17.0 g/dL   HCT 33.7 (L) 39.0 - 52.0 %  Glucose, capillary     Status: Abnormal   Collection Time: 05/12/16  7:00 PM  Result Value Ref Range   Glucose-Capillary 198 (H) 65 - 99 mg/dL   Comment 1 Notify RN    Comment 2 Document in Chart   C difficile quick scan w PCR reflex     Status: Abnormal   Collection Time: 05/12/16  8:00 PM  Result Value Ref Range   C Diff antigen POSITIVE (A) NEGATIVE   C Diff toxin NEGATIVE NEGATIVE   C Diff interpretation Results are indeterminate. See PCR results.   Clostridium Difficile by PCR     Status: Abnormal   Collection Time: 05/12/16  8:00 PM  Result Value Ref Range   Toxigenic C Difficile by pcr POSITIVE (A) NEGATIVE    Comment: Positive for toxigenic C. difficile with little to no  toxin  production. Only treat if clinical presentation suggests symptomatic illness. Performed at [facility]   Glucose, capillary     Status: Abnormal   Collection Time: 05/12/16  8:22 PM  Result Value Ref Range   Glucose-Capillary 175 (H) 65 - 99 mg/dL  Glucose, capillary     Status: Abnormal   Collection Time: 05/12/16 11:52 PM  Result Value Ref Range   Glucose-Capillary 143 (H) 65 - 99 mg/dL  Hemoglobin and hematocrit, blood     Status: Abnormal   Collection Time: 05/13/16  1:51 AM  Result Value Ref Range   Hemoglobin 10.7 (L) 13.0 - 17.0 g/dL   HCT 31.5 (L) 39.0 - 52.0 %  Glucose, capillary     Status: Abnormal   Collection Time: 05/13/16  6:02 AM  Result Value Ref Range   Glucose-Capillary 206 (H) 65 - 99 mg/dL  Hemoglobin and hematocrit, blood     Status: Abnormal   Collection Time: 05/13/16 10:14 AM  Result Value Ref Range   Hemoglobin 10.6 (L) 13.0 - 17.0 g/dL   HCT 32.0 (L) 39.0 - 52.0 %  Glucose, capillary     Status: Abnormal   Collection Time: 05/13/16 10:27 AM  Result Value Ref Range   Glucose-Capillary 178 (H) 65 - 99 mg/dL  Glucose, capillary     Status: Abnormal   Collection Time: 05/13/16  2:27 PM  Result Value Ref Range   Glucose-Capillary 177 (H) 65 - 99 mg/dL  CBC w/Diff     Status: Abnormal   Collection Time: 05/18/16  2:04 PM  Result Value Ref Range   WBC 4.7 4.0 - 10.5 K/uL   RBC 3.51 (L) 4.22 - 5.81 Mil/uL   Hemoglobin 10.9 (L) 13.0 - 17.0 g/dL   HCT 30.9 (L) 39.0 - 52.0 %   MCV 88.0 78.0 - 100.0 fl   MCHC 35.3 30.0 - 36.0 g/dL   RDW 16.1 (H) 11.5 - 15.5 %   Platelets 208.0 150.0 - 400.0 K/uL   Neutrophils Relative % 68.1 43.0 - 77.0 %   Lymphocytes Relative 20.9 12.0 - 46.0 %   Monocytes Relative 7.5 3.0 - 12.0 %   Eosinophils Relative 3.2 0.0 - 5.0 %   Basophils Relative 0.3 0.0 - 3.0 %   Neutro Abs 3.2 1.4 - 7.7 K/uL   Lymphs Abs 1.0 0.7 - 4.0 K/uL   Monocytes Absolute 0.4 0.1 - 1.0 K/uL   Eosinophils Absolute 0.2 0.0 - 0.7 K/uL    Basophils Absolute 0.0 0.0 - 0.1 K/uL  Basic Metabolic Panel (BMET)     Status: Abnormal   Collection Time: 05/18/16  2:04 PM  Result Value Ref Range   Sodium 139 135 - 145 mEq/L   Potassium 4.2 3.5 - 5.1 mEq/L   Chloride 106 96 - 112 mEq/L   CO2 27 19 - 32 mEq/L   Glucose, Bld 127 (H) 70 - 99 mg/dL   BUN 26 (H) 6 - 23 mg/dL   Creatinine, Ser 1.55 (H) 0.40 - 1.50 mg/dL   Calcium 9.3 8.4 - 10.5 mg/dL   GFR 46.87 (L) >60.00 mL/min    Follow-up: Return if symptoms worsen or fail to improve.  Wilfred Lacy, NP

## 2016-05-18 NOTE — Progress Notes (Signed)
Pre visit review using our clinic review tool, if applicable. No additional management support is needed unless otherwise documented below in the visit note. 

## 2016-05-21 ENCOUNTER — Telehealth: Payer: Self-pay | Admitting: *Deleted

## 2016-05-21 MED ORDER — APIXABAN 5 MG PO TABS: 5.0000 mg | ORAL_TABLET | Freq: Two times a day (BID) | ORAL | 3 refills | Status: DC

## 2016-05-21 NOTE — Telephone Encounter (Signed)
Left msg on triage stating trying to get assistance w/ [employer] for husband Eliquis. They are needing prescription sent to them. Called dpt wife back she gave a fax # where script need to go. Faxed to  [phone]...Johny Chess

## 2016-06-01 ENCOUNTER — Telehealth: Payer: Self-pay | Admitting: *Deleted

## 2016-06-01 ENCOUNTER — Other Ambulatory Visit: Payer: Self-pay

## 2016-06-01 MED ORDER — APIXABAN 5 MG PO TABS
5.0000 mg | ORAL_TABLET | Freq: Two times a day (BID) | ORAL | 5 refills | Status: DC
Start: 2016-06-01 — End: 2016-06-02

## 2016-06-01 NOTE — Telephone Encounter (Signed)
Patients wife called and stated that patients pcp sent an rx for eliquis to [facility] for patient assistance. Per patients wife, Roosvelt Harps received it, but they are requiring an rx from Dr Fletcher Anon as he is the physician that originally prescribed it. It needs to be faxed to [phone]. Thanks, MI

## 2016-06-02 MED ORDER — APIXABAN 5 MG PO TABS: 5.0000 mg | ORAL_TABLET | Freq: Two times a day (BID) | ORAL | 3 refills | Status: DC

## 2016-06-02 NOTE — Telephone Encounter (Signed)
Prescription printed and signed by Dr Fletcher Anon.  This should be a 90 day supply (#180) with 3 refills. Rx faxed to [phone].

## 2016-06-05 ENCOUNTER — Encounter: Payer: Self-pay | Admitting: Internal Medicine

## 2016-06-05 ENCOUNTER — Ambulatory Visit (INDEPENDENT_AMBULATORY_CARE_PROVIDER_SITE_OTHER): Payer: Medicare Other | Admitting: Internal Medicine

## 2016-06-05 ENCOUNTER — Ambulatory Visit (INDEPENDENT_AMBULATORY_CARE_PROVIDER_SITE_OTHER)
Admission: RE | Admit: 2016-06-05 | Discharge: 2016-06-05 | Disposition: A | Discharge status: Home / Self Care | Payer: Medicare Other | Source: Ambulatory Visit | Attending: Internal Medicine | Admitting: Internal Medicine

## 2016-06-05 VITALS — BP 148/80 | HR 65 | Ht 71.0 in | Wt 266.2 lb

## 2016-06-05 DIAGNOSIS — I10 Essential (primary) hypertension: Secondary | ICD-10-CM | POA: Diagnosis not present

## 2016-06-05 DIAGNOSIS — J189 Pneumonia, unspecified organism: Secondary | ICD-10-CM

## 2016-06-05 DIAGNOSIS — I6523 Occlusion and stenosis of bilateral carotid arteries: Secondary | ICD-10-CM | POA: Diagnosis not present

## 2016-06-05 DIAGNOSIS — R0602 Shortness of breath: Secondary | ICD-10-CM | POA: Insufficient documentation

## 2016-06-05 MED ORDER — VALSARTAN 160 MG PO TABS: 160.0000 mg | ORAL_TABLET | Freq: Every day | ORAL | 11 refills | Status: DC

## 2016-06-05 NOTE — Progress Notes (Signed)
Subjective:    Patient ID: Michael [name], male    DOB: Nov 30, 1940,    MRN: [id number]     Brief patient profile:  71 yowm quit smoking 2007 with no evidence copd by pfts 01/08/15  maint on humira for psoriasis/arthritis  with dx of outpt pna but completely recovered symptomatically then abruptly ill and admit with CAP presented with Left lower cp/ weakness  And  Temp 102> admit:    Admit date: 03/17/2016  Discharge date: 03/21/2016    Recommendations for Outpatient Follow-up:   Follow up with PCP in 1-2 weeks  PCP Please obtain BMP/CBC, 2 view CXR in 1week,  (see Discharge instructions)   PCP Please follow up on the following pending results: None   Home Health: None Equipment/Devices: None  Consultations: None Discharge Condition: Stable   CODE STATUS: Full   Diet Recommendation: Heart Healthy Low Carb      Chief Complaint  Patient presents with  . Chest Pain  . Cough  . Weakness     Brief history of present illness from the day of admission and additional interim summary    Michael Bucklin Jonesis a 75 y.o.malewith medical history significant of psoriasis on Humira, hypertension, hyperlipidemia, diabetes mellitus, GERD, hypothyroidism, gout, PAF on Eliquis, CAd, AVM of colon, CKD-III,who presents with shortness, cough, fever, chills, rash.   Patient states that he has been having chest pain, dry cough, SOB,fever, chills in the past 3 days. His chest pain is located in the left side of chest, constant, moderate, radiating to the back, sharp, pleuritic. It is aggravated by coughing and deep breath. No tenderness over calf areas. Patient denies nausea, vomiting, diarrhea, abdominal pain, symptoms of UTI or unilateral weakness. Pt notice somescatteredpimple rashes in legs and arms recently.  ED Course:pt was found to have WBC 10.8, temperature 102.6, no tachycardia, has tachypnea, oxygen saturation 93% on room air, lactate 1.90, troponin negative, was seen in  renal function, sodium 131. Chest x-ray showed infiltration left upper lobe.  Hospital issues addressed    1. CAP with Sepsis - Patient is immunocompromised as he is on Humira, seems to have responded well to empiric IV antibiotics which are Vancomycin, Zosyn and Doxy, clinically improved, sepsis physiology has resolved after IV fluids, lactate normalized and today he is completely symptom-free off oxygen, eager to go home, able to stand up and walk by himself does not want home health PT RN .Will transition antibiotics to oral Vantin and doxycycline for 5 more days with follow-up with PCP for repeat CBC, BMP and a 2 view chest x-ray in the next 3-5 days.  2. Paroxysmal atrial fibrillation [place] vasc 2 score of 3. Continue Eliquis, beta blocker, Cardizem and amiodarone combination. Stable blood pressure and rate.  3. Dyslipidemia. Continue statin.  4. Psoriasis. On Humira outpatient.  5. Hypothyroidism. Continue Synthroid.  6. Essential hypertension. Renew home regimen except hold lisinopril for another day due to recent ARF.  7. GERD. On PPI.  8. ARF on CKD 3. Baseline creatinine close to 1.5. Now at baseline after hydration.  9. Gout. On allopurinol.   10. Hyponatremia due to dehydration from sepsis. Proved with hydration and repeat BMP in 3-4 days by PCP.  11. Diarrhea on 03/19/2016. No further stools in the last 12 hours on 03/20/2016, stool studies suggest C. difficile colonization, diarrhea has completely resolved in the last 24 hours.   12. Anemia of chronic disease. Dilutional fall from IV fluids, outpatient monitor in one week. Outpatient  workup by PCP as needed.   13. DM type II. Resume home regimen follow with PCP.   Discharge diagnosis     Principal Problem:   CAP (community acquired pneumonia) Active Problems:   DM (diabetes mellitus) ([place])   Iron deficiency anemia due to chronic blood loss   HTN (hypertension), benign   Gout    Hypertriglyceridemia   Psoriasis   PAF (paroxysmal atrial fibrillation) ([facility])   Hyperlipidemia   Hypothyroidism   Acute renal failure superimposed on stage 3 chronic kidney disease ([place])   Sepsis ([place])   Rash    Discharge instructions        Discharge Instructions    Discharge instructions    Complete by:  As directed   Follow with Primary MD [name], MD in 2-3 days   Get CBC, CMP, 2 view Chest X ray checked  by Primary MD or SNF MD in 5-7 days ( we routinely change or add medications that can affect your baseline labs and fluid status, therefore we recommend that you get the mentioned basic workup next visit with your PCP, your PCP may decide not to get them or add new tests based on their clinical decision)    05/08/2016 1st [facility] office visit/ [name]   Chief Complaint  Patient presents with  . Advice Only    self referral for abn cxr after being treated for PNA>  denies any breathing complaints at this time.   Took last dose of Humira one week p discharge and then dermatologist rec hold further dosing  L cp was never really pleuritic and never really coughed up purulent sputum but L post cp and fever resolved on abx  Not limited by breathing from desired activities   rec Please remember to go to the lab and x-ray department downstairs for your tests - we will call you with the results when they are available. Definitely leave off humira for as long as you can unless the arthritis/ psoriasis get a lot worse than now     06/05/2016  f/u ov/[name] re: HCAP  Chief Complaint  Patient presents with  . Follow-up    Pt. states he has been getting short winded lately, Pt. denies coughing,chest pain,  doe = MMRC2 = can't walk a nl pace on a flat grade s sob but does fine slow and flat eg shopping  No obvious day to day or daytime variability or assoc excess/ purulent sputum or mucus plugs or hemoptysis or cp or chest tightness, subjective wheeze or overt  sinus or hb symptoms. No unusual exp hx or h/o childhood pna/ asthma or knowledge of premature birth.  Sleeping ok without nocturnal  or early am exacerbation  of respiratory  c/o's or need for noct saba. Also denies any obvious fluctuation of symptoms with weather or environmental changes or other aggravating or alleviating factors except as outlined above   Current Medications, Allergies, Complete Past Medical History, Past Surgical History, Family History, and Social History were reviewed in [employer] record.  ROS  The following are not active complaints unless bolded sore throat, dysphagia, dental problems, itching, sneezing,  nasal congestion or excess/ purulent secretions, ear ache,   fever, chills, sweats, unintended wt loss, classically pleuritic or exertional cp,  orthopnea pnd or leg swelling, presyncope, palpitations, abdominal pain, anorexia, nausea, vomiting, diarrhea  or change in bowel or bladder habits, change in stools or urine, dysuria,hematuria,  rash, arthralgias, visual complaints, headache, numbness, weakness or ataxia  or problems with walking or coordination,  change in mood/affect or memory.                  Objective:   Physical Exam  amb wm nad   06/05/2016    266   05/08/16 256 lb 6.4 oz (116.3 kg)  04/17/16 255 lb (115.7 kg)  03/24/16 253 lb (114.8 kg)    Vital signs reviewed  - Note on arrival 02 sats  98% on RA    HEENT: nl   turbinates, and oropharynx. Nl external ear canals without cough reflex- edentulous   NECK :  without JVD/Nodes/TM/ nl carotid upstrokes bilaterally   LUNGS: no acc muscle use,  Nl contour chest which is clear to A and P bilaterally without cough on insp or exp maneuvers   CV:  RRR  no s3 or murmur or increase in P2, no edema   ABD:  soft and nontender with nl inspiratory excursion in the supine position. No bruits or organomegaly, bowel sounds nl  MS:  Nl gait/ ext warm without deformities, calf  tenderness, cyanosis or clubbing No obvious joint restrictions   SKIN: warm and dry without lesions    NEURO:  alert, approp, nl sensorium with  no motor deficits          CXR PA and Lateral:   06/05/2016 :    I personally reviewed images and agree with radiology impression as follows:    There is persistent patchy opacity in the lingular region. Lungs elsewhere clear. Heart is upper normal in size with pulmonary vascularity within normal limits. There is atherosclerotic calcification in the aorta. No bone lesions. No adenopathy       Lab Results  Component Value Date   ESRSEDRATE 3 05/08/2016   ESRSEDRATE 22 11/07/2013    Assessment & Plan:

## 2016-06-05 NOTE — Patient Instructions (Addendum)
Stop prinivil now  Valsartan 160 mg one daily in place of prinivil  Work on weight loss   Please remember to go to the x-ray department downstairs for your tests - we will call you with the results when they are available.     Please schedule a follow up office visit in 4 weeks, sooner if needed with pfts on return - ok to do at [facility]

## 2016-06-06 NOTE — Assessment & Plan Note (Addendum)
Amiodarone rx since at least 11/16/14 per EPIC med records PFTs  01/08/15   FVC 3.73 (82%)  With Dlco 69 corrects to 87% for alv vol - 06/05/2016  Walked RA x 2 laps @ 185 ft each stopped due to sob with sats 90% slow pace   Concerned about  recent Pna and exp to amiodarone chronically either  of which may have contributed to sob/borderline sats but for now no reason to change rx > will have him return for full pfts and serial walks  Patients typically have been on amiodarone for 6-12 months before this complication manifests.  Of note, serial clinical evaluation for symptoms such as cough dyspnea or fevers is  the preferred method of monitoring for pulmonary toxicity because a decrease in DLCO or lung volumes is a nonspecific for toxicity. Pathologically amiodarone pulmonary toxicity may appear as interstitial pneumonitis, eosinophilic pneumonia, organizing pneumonia, pulmonary fibrosis or less commonly as diffuse alveolar hemorrhage, pulmonary nodules or pleural effusions.  Risk factors for pulmonary toxicity include age greater than 60, daily dose greater than equal to 400 mg, a high cumulative dose, or pre-existing lung disease  - so he may have 3/4 risk factors here and needs to be carefully monitored and will try off acei x 4 weeks to see if contributing to any of his symptoms.  I had an extended discussion with the patient/wife  reviewing all relevant studies completed to date and  lasting 15 to 20 minutes of a 25 minute visit    Each maintenance medication was reviewed in detail including most importantly the difference between maintenance and prns and under what circumstances the prns are to be triggered using an action plan format that is not reflected in the computer generated alphabetically organized AVS.    Please see instructions for details which were reviewed in writing and the patient given a copy highlighting the part that I personally wrote and discussed at today's ov.

## 2016-06-06 NOTE — Assessment & Plan Note (Addendum)
03/17/16 Probably  Lingular > resolved 90% radiographically 05/08/2016  - ESR 05/08/2016 = 3   - 06/05/2016 minimal residual changes lingular   Clinically completely resolved with minimal changes in lingula that do not need additional w/u at this point and certainly ok to resume humira at this point If needed for psoriasis

## 2016-06-06 NOTE — Assessment & Plan Note (Signed)
-   trial off acei 06/05/2016 due to doe not fully explained  In the best review of chronic cough to date ( NEJM 2016 375 3810-1751) ,  ACEi are now felt to cause cough in up to  20% of pts which is a 4 fold increase from previous reports and does not include the variety of non-specific complaints we see in pulmonary clinic in pts on ACEi but previously attributed to another dx like  Copd/asthma and  include PNDS, throat and chest congestion, "bronchitis", unexplained dyspnea and noct "strangling" sensations, and hoarseness, but also  atypical /refractory GERD symptoms like dysphagia and "bad heartburn"   The only way I know  to prove this is not an "ACEi Case" is a trial off ACEi x a minimum of 6 weeks then regroup.  rec try diovan 160 mg daily given the difficulty sorting out all of his non-specific resp complaints prior to return with full pfts

## 2016-06-06 NOTE — Assessment & Plan Note (Signed)
Body mass index is 37.13 trending up   Lab Results  Component Value Date   TSH 1.89 09/17/2015     Contributing to gerd tendency/ doe/reviewed the need and the process to achieve and maintain neg calorie balance > defer f/u primary care including intermittently monitoring thyroid status

## 2016-06-08 ENCOUNTER — Ambulatory Visit (INDEPENDENT_AMBULATORY_CARE_PROVIDER_SITE_OTHER): Payer: Medicare Other | Admitting: Internal Medicine

## 2016-06-08 ENCOUNTER — Telehealth: Payer: Self-pay

## 2016-06-08 ENCOUNTER — Encounter: Payer: Self-pay | Admitting: Internal Medicine

## 2016-06-08 VITALS — BP 160/62 | HR 78 | Ht 71.0 in | Wt 261.0 lb

## 2016-06-08 DIAGNOSIS — D122 Benign neoplasm of ascending colon: Secondary | ICD-10-CM

## 2016-06-08 DIAGNOSIS — D125 Benign neoplasm of sigmoid colon: Secondary | ICD-10-CM

## 2016-06-08 DIAGNOSIS — I6523 Occlusion and stenosis of bilateral carotid arteries: Secondary | ICD-10-CM

## 2016-06-08 DIAGNOSIS — K635 Polyp of colon: Secondary | ICD-10-CM

## 2016-06-08 DIAGNOSIS — I4891 Unspecified atrial fibrillation: Secondary | ICD-10-CM

## 2016-06-08 DIAGNOSIS — Z7901 Long term (current) use of anticoagulants: Secondary | ICD-10-CM | POA: Diagnosis not present

## 2016-06-08 NOTE — Progress Notes (Signed)
Spoke with the pt's spouse and notified of results

## 2016-06-08 NOTE — Telephone Encounter (Signed)
  06/08/2016   RE: ELIZAH MIERZWA DOB: March 10, 1941 MRN: [id number]   Dear Dr. Johnsie Cancel    We have scheduled the above patient for an endoscopic procedure. Our records show that he is on anticoagulation therapy.   Please advise if patient may come off his therapy of Eliquis for 3 days prior to the procedure, which is scheduled for 06/25/2016.  Please fax back/ or route the completed form to [place] at [phone].   Sincerely,    Patty Martinique

## 2016-06-08 NOTE — Progress Notes (Signed)
Michael [name] 75 y.o. 1940/08/22 [id number]  Assessment & Plan:   Encounter Diagnoses  Name Primary?  . Polyp of ascending colon, unspecified type Yes  . Polyp of sigmoid colon, unspecified type   . Long term current use of anticoagulant   . Atrial fibrillation, unspecified type [facility])    Colonoscopy and polypectomy is appropriate. I reviewed the risks benefits and indications including bleeding perforation possible hospitalization other injury etc. I've also reviewed the need to hold his Eliquis for about 3 days before due to his reduced GFR. This does pose a possible risk of stroke that is rare but unlikely. He is aware of this and accepts that risk. We will double check with his cardiologist for holding the anticoagulation, as I have recommended.   Subjective:   Chief Complaint: colon polyps  HPI The patient is an elderly white man here with his wife, I had seen him in the past and removed colon polyps and he was due for a surveillance colonoscopy this year. He was sick in the hospital with pneumonia in September and then returned last month with C. difficile colitis and bleeding. He had a colonoscopy, apparently his L it was was still on board, it was thought that he was bleeding from hemorrhoids but he had several polyps in the colon noted but not removed because of the anticoagulation. His creatinine was elevated some and is only been on liquids for 36 hours of Dr. Havery Moros deferred polypectomy.  - One large polyp in the distal ascending colon. - Two 4 to 7 mm polyps in the ascending colon. - A few 3 to 6 mm polyps in the sigmoid colon. Allergies  Allergen Reactions  . Percocet [Oxycodone-Acetaminophen] Nausea And Vomiting  . Invokana [Canagliflozin]     Side effects  . Metformin And Related     Upset stomach   Outpatient Medications Prior to Visit  Medication Sig Dispense Refill  . acetaminophen (TYLENOL) 650 MG CR tablet Take 1,300 mg by mouth every 8 (eight) hours  as needed for pain.     [name] allopurinol (ZYLOPRIM) 100 MG tablet Take 2 tablets (200 mg total) by mouth daily. 60 tablet 11  . amiodarone (PACERONE) 200 MG tablet Take 1 tablet (200 mg total) by mouth daily. 30 tablet 11  . apixaban (ELIQUIS) 5 MG TABS tablet Take 1 tablet (5 mg total) by mouth 2 (two) times daily. 180 tablet 3  . atorvastatin (LIPITOR) 40 MG tablet Take 1 tablet (40 mg total) by mouth daily. 90 tablet 2  . Blood Glucose Monitoring Suppl (ONE TOUCH ULTRA 2) W/DEVICE KIT Use as directed Dx E11.9 1 each 0  . Blood Pressure Monitor KIT Use to check blood pressure daily Dx I10 1 each 0  . Cholecalciferol 1000 UNITS TBDP Take 1,000-5,000 Units by mouth daily. Takes 1000 units everyday except for on Saturday patient takes 5000 units    . diltiazem (CARDIZEM CD) 180 MG 24 hr capsule Take 1 capsule (180 mg total) by mouth daily. 30 capsule 10  . fenofibrate 160 MG tablet TAKE 1 TABLET(160 MG) BY MOUTH DAILY 30 tablet 11  . ferrous sulfate 325 (65 FE) MG tablet Take 325 mg by mouth 3 (three) times daily with meals.    . furosemide (LASIX) 20 MG tablet Take 1 tablet (20 mg total) by mouth daily. 90 tablet 3  . glipiZIDE (GLUCOTROL) 10 MG tablet Take 10 mg by mouth daily.    [name] glucose blood (ONE TOUCH ULTRA TEST)  test strip 1 each by Other route 4 (four) times daily. Use to check blood sugar four times a day  Dx: E11.9- insulin Dependant 120 each 5  . HUMIRA PEN 40 MG/0.8ML PNKT Inject 40 mg into the skin every 14 (fourteen) days.    [name] HYDROcodone-acetaminophen (NORCO/VICODIN) 5-325 MG tablet Take 1 tablet by mouth 2 (two) times daily as needed for moderate pain or severe pain. 100 tablet 0  . hydrocortisone (ANUSOL-HC) 25 MG suppository Place 1 suppository (25 mg total) rectally 2 (two) times daily. 12 suppository 0  . Insulin Glargine (TOUJEO SOLOSTAR) 300 UNIT/ML SOPN Inject 50 Units into the skin daily. Titrate up by 1 unit every 1-2 days for goal sugars of 120-150 6 mL 11  . Insulin Pen  Needle 31G X 5 MM MISC 1 Units by Does not apply route 4 (four) times daily. 150 each 11  . Lancets (ONETOUCH ULTRASOFT) lancets Use to check blood sugars daily 30 each 11  . levothyroxine (SYNTHROID, LEVOTHROID) 150 MCG tablet TAKE 1 TABLET BY MOUTH DAILY BEFORE BREAKFAST 90 tablet 3  . metoprolol (LOPRESSOR) 50 MG tablet TAKE 1 TABLET(50 MG) BY MOUTH TWICE DAILY 180 tablet 1  . omeprazole (PRILOSEC) 40 MG capsule TAKE 1 CAPSULE BY MOUTH EVERY DAY 90 capsule 3  . valsartan (DIOVAN) 160 MG tablet Take 1 tablet (160 mg total) by mouth daily. 30 tablet 11   No facility-administered medications prior to visit.    Past Medical History:  Diagnosis Date  . Allergy   . Arthritis   . AVM (arteriovenous malformation) of colon   . CAD (coronary artery disease)    a. Cath 10/2014 - Mild to moderate diagonal, circumflex and obtuse marginal disease. Innominate artery sternosis.  . Carotid artery disease ([place])    a. Carotid dopple 03/2014 25-85% RICA &  27-78% LICA stenosis b. 2/42  . CKD (chronic kidney disease), stage III   . Dysrhythmia    ATRIAL FIBRILATION  . Elevated troponin    a. 09/2014 in setting of AF RVR-->Myoview: EF 49%, no ischemia/infarct->Med Rx.  . GERD (gastroesophageal reflux disease)   . Gout   . H/O transfusion of whole blood   . History of PFTs    PFTs 6/16:  FVC 3.73 (87%), FEV1 2.93 (88%), FEV1/FVC 78%, DLCO 69%  . Hyperlipidemia   . Hypertension   . Hypothyroidism   . Iron deficiency anemia   . PAF (paroxysmal atrial fibrillation) ([place])    a. 09/2014: Converted on Dilt;  b. CHA2DS2VASc = 3-->eliquis;  c. 09/2014 Echo: EF 55-60%, mild LVH, mildly dil LA.  [name] Personal history of colonic polyps 2007, 2008   adenoma each time, largest 12 mm in 2007  . Psoriasis   . Type II diabetes mellitus ([place])    TYPE 2   Past Surgical History:  Procedure Laterality Date  . APPENDECTOMY  02/09/2014  . COLONOSCOPY  02/10/2011   internal hemorrhoids  . COLONOSCOPY W/ POLYPECTOMY   04/09/2006   12 mm adenoma  . COLONOSCOPY W/ POLYPECTOMY  06/17/2007   5 mm adenoma  . COLONOSCOPY WITH PROPOFOL N/A 05/13/2016   Procedure: COLONOSCOPY WITH PROPOFOL;  Surgeon: Manus Gunning, MD;  Location: [facility];  Service: Gastroenterology;  Laterality: N/A;  . ESOPHAGOGASTRODUODENOSCOPY  12/23/2011   Procedure: ESOPHAGOGASTRODUODENOSCOPY (EGD);  Surgeon: Irene Shipper, MD;  Location: [facility];  Service: Endoscopy;  Laterality: N/A;  with small bowel bx's  . GIVENS CAPSULE STUDY  12/28/2011  . KNEE ARTHROSCOPY  right  . LAPAROSCOPIC APPENDECTOMY N/A 02/09/2014   Procedure: APPENDECTOMY LAPAROSCOPIC;  Surgeon: Leighton Ruff, MD;  Location: [facility];  Service: General;  Laterality: N/A;  . LEFT HEART CATHETERIZATION WITH CORONARY ANGIOGRAM N/A 11/15/2014   Procedure: LEFT HEART CATHETERIZATION WITH CORONARY ANGIOGRAM;  Surgeon: Belva Crome, MD;  Location: [facility];  Service: Cardiovascular;  Laterality: N/A;  . ROTATOR CUFF REPAIR     left   Social History   Social History  . Marital status: Married    Spouse name: N/A  . Number of children: N/A  . Years of education: 68   Occupational History  .  Retired    Social History Main Topics  . Smoking status: Former Smoker    Packs/day: 0.70    Years: 48.00    Types: Cigarettes    Quit date: 03/29/2006  . Smokeless tobacco: Never Used  . Alcohol use 0.6 oz/week    1 Cans of beer per week     Comment: occasional alcohol intake  . Drug use: No  . Sexual activity: Not Currently   Other Topics Concern  . None   Social History Narrative   Regular exercise-no   Caffeine Use-yes   Family History  Problem Relation Age of Onset  . Heart attack Father   . Lung cancer Father   . Diabetes Father   . Stroke Father   . Hypertension Father   . Stroke Mother   . Hypertension Mother   . Cancer Brother     liver  . Heart disease Brother     chf  . COPD Sister   . Malignant hyperthermia Neg Hx        Review  of Systems Getting stronger after his hospitalizations.  Objective:   Physical Exam BP (!) 160/62   Pulse 78   Ht _0  (1.803 m)   Wt 261 lb (118.4 kg)   BMI 36.40 kg/m  No acute distress Eyes are anicteric Lungs clear Heart S1-S2 Abdomen is obese and soft

## 2016-06-08 NOTE — Patient Instructions (Signed)

## 2016-06-08 NOTE — Telephone Encounter (Signed)
Ok to stop eliquis 2 days before endoscopy

## 2016-06-09 ENCOUNTER — Telehealth: Payer: Self-pay

## 2016-06-09 NOTE — Telephone Encounter (Signed)
-----   Message from Irene Shipper, MD sent at 06/09/2016  1:58 PM EST ----- Has renal insufficiency. 3 days. Thanks ----- Message ----- From: Audrea Muscat, CMA Sent: 06/09/2016  11:18 AM To: Irene Shipper, MD  You had indicated you wanted patient to hold his Eliquis for 3 days prior to procedure.  I sent letter to Dr. Johnsie Cancel specifically asking about 3 days and his response was he could hold it for 2.  Just wanted to clarify which number I should use.

## 2016-06-09 NOTE — Telephone Encounter (Signed)
Spoke with patient's wife and told her that patient should hold his Eliquis for 3 days prior to her procedure.  She acknowledged and understood

## 2016-06-10 ENCOUNTER — Ambulatory Visit
Admission: RE | Admit: 2016-06-10 | Discharge: 2016-06-10 | Disposition: A | Discharge status: Home / Self Care | Payer: Medicare Other | Source: Ambulatory Visit | Attending: Nurse Practitioner | Admitting: Nurse Practitioner

## 2016-06-10 DIAGNOSIS — R42 Dizziness and giddiness: Secondary | ICD-10-CM | POA: Diagnosis not present

## 2016-06-15 ENCOUNTER — Telehealth: Payer: Self-pay | Admitting: Internal Medicine

## 2016-06-15 NOTE — Telephone Encounter (Signed)
lmtcb for pt's wife, [name].

## 2016-06-16 ENCOUNTER — Ambulatory Visit (INDEPENDENT_AMBULATORY_CARE_PROVIDER_SITE_OTHER): Payer: Medicare Other | Admitting: Cardiovascular Disease

## 2016-06-16 ENCOUNTER — Telehealth: Payer: Self-pay | Admitting: Internal Medicine

## 2016-06-16 ENCOUNTER — Encounter: Payer: Self-pay | Admitting: Cardiovascular Disease

## 2016-06-16 VITALS — BP 164/60 | HR 68 | Ht 71.0 in | Wt 262.6 lb

## 2016-06-16 DIAGNOSIS — I480 Paroxysmal atrial fibrillation: Secondary | ICD-10-CM | POA: Diagnosis not present

## 2016-06-16 DIAGNOSIS — I739 Peripheral vascular disease, unspecified: Secondary | ICD-10-CM

## 2016-06-16 DIAGNOSIS — R0602 Shortness of breath: Secondary | ICD-10-CM | POA: Diagnosis not present

## 2016-06-16 DIAGNOSIS — I2510 Atherosclerotic heart disease of native coronary artery without angina pectoris: Secondary | ICD-10-CM | POA: Diagnosis not present

## 2016-06-16 DIAGNOSIS — I6523 Occlusion and stenosis of bilateral carotid arteries: Secondary | ICD-10-CM

## 2016-06-16 DIAGNOSIS — I779 Disorder of arteries and arterioles, unspecified: Secondary | ICD-10-CM

## 2016-06-16 NOTE — Telephone Encounter (Signed)
Per [place], ok to release results of ct----I tried to call patient on phone number listed below, no mailbox set up yet, no way to leave message----ct results show that ct scan is normal, no neuro explanation for dizziness, can talk with Michael [name] if any further questions

## 2016-06-16 NOTE — Telephone Encounter (Signed)
Wife of pt called in and would like to get CT results.  Pt has CT done last week at [facility]

## 2016-06-16 NOTE — Patient Instructions (Signed)
Medication Instructions:  Your physician recommends that you continue on your current medications as directed. Please refer to the Current Medication list given to you today.  Labwork: No new orders.   Testing/Procedures: Your physician has requested that you have an echocardiogram. Echocardiography is a painless test that uses sound waves to create images of your heart. It provides your doctor with information about the size and shape of your heart and how well your heart's chambers and valves are working. This procedure takes approximately one hour. There are no restrictions for this procedure.  Follow-Up: Your physician wants you to follow-up in: 1 YEAR with Dr Fletcher Anon.  You will receive a reminder letter in the mail two months in advance. If you don't receive a letter, please call our office to schedule the follow-up appointment.   Any Other Special Instructions Will Be Listed Below (If Applicable).     If you need a refill on your cardiac medications before your next appointment, please call your pharmacy.

## 2016-06-16 NOTE — Telephone Encounter (Signed)
lmtcb x2 for pt's wife. 

## 2016-06-16 NOTE — Progress Notes (Signed)
Cardiology Office Note   Date:  06/16/2016   ID:  Michael [name] 02/28/1941, MRN [id number]  PCP:  [name], MD  Cardiologist:  Dr. Canary Brim, MD   No chief complaint on file.     History of Present Illness: Michael [name] is a 75 y.o. male who presents for a follow-up visit regarding right subclavian artery stenosis.   He has known hx of PAF, HTN, HL, diabetes, prior GI bleed (2/2 small bowel AVM) and moderate CAD.  He had cardiac catheterization in April of 2016 which demonstrated mild to moderate diagonal, circumflex and OM disease, normal LV function and borderline significant innominate artery stenosis.  The catheterization was done via the right radial artery in the patient was noted to have calcified stenosis involving the right innominate artery. The patient underwent carotid US and upper extremity arterial duplex. He has bilateral ICA stenosis 40-59%. There was evidence of more proximal obstruction in the right upper extremity based upon waveform dampening.  The patient went on to have a CT angiogram which showed  Greater than 50% stenosis of the right subclavian artery.   The patient has known history of psoriasis arthritis affecting his hands, wrists and shoulders. He has significant contraction in his right wrist. His symptoms improved significantly with Humira. Right subclavian stenosis was felt to be mostly asymptomatic and thus I have recommended medical therapy. He was hospitalized in August for pneumonia and again in October for C. difficile colitis. He has been complaining of worsening exertional dyspnea since then and was seen by pulmonary. He was most recently switched from lisinopril to valsartan. He denies any chest discomfort. He has no significant right arm claudication.   Past Medical History:  Diagnosis Date  . Allergy   . Arthritis   . AVM (arteriovenous malformation) of colon   . CAD (coronary artery disease)    a. Cath 10/2014 - Mild  to moderate diagonal, circumflex and obtuse marginal disease. Innominate artery sternosis.  . Carotid artery disease ([place])    a. Carotid dopple 03/2014 77-82% RICA &  42-35% LICA stenosis b. 3/61  . CKD (chronic kidney disease), stage III   . Dysrhythmia    ATRIAL FIBRILATION  . Elevated troponin    a. 09/2014 in setting of AF RVR-->Myoview: EF 49%, no ischemia/infarct->Med Rx.  . GERD (gastroesophageal reflux disease)   . Gout   . H/O transfusion of whole blood   . History of PFTs    PFTs 6/16:  FVC 3.73 (87%), FEV1 2.93 (88%), FEV1/FVC 78%, DLCO 69%  . Hyperlipidemia   . Hypertension   . Hypothyroidism   . Iron deficiency anemia   . PAF (paroxysmal atrial fibrillation) ([place])    a. 09/2014: Converted on Dilt;  b. CHA2DS2VASc = 3-->eliquis;  c. 09/2014 Echo: EF 55-60%, mild LVH, mildly dil LA.  [name] Personal history of colonic polyps 2007, 2008   adenoma each time, largest 12 mm in 2007  . Psoriasis   . Type II diabetes mellitus ([place])    TYPE 2    Past Surgical History:  Procedure Laterality Date  . APPENDECTOMY  02/09/2014  . COLONOSCOPY  02/10/2011   internal hemorrhoids  . COLONOSCOPY W/ POLYPECTOMY  04/09/2006   12 mm adenoma  . COLONOSCOPY W/ POLYPECTOMY  06/17/2007   5 mm adenoma  . COLONOSCOPY WITH PROPOFOL N/A 05/13/2016   Procedure: COLONOSCOPY WITH PROPOFOL;  Surgeon: Manus Gunning, MD;  Location: [facility];  Service: Gastroenterology;  Laterality: N/A;  . ESOPHAGOGASTRODUODENOSCOPY  12/23/2011   Procedure: ESOPHAGOGASTRODUODENOSCOPY (EGD);  Surgeon: Irene Shipper, MD;  Location: [facility];  Service: Endoscopy;  Laterality: N/A;  with small bowel bx's  . GIVENS CAPSULE STUDY  12/28/2011  . KNEE ARTHROSCOPY     right  . LAPAROSCOPIC APPENDECTOMY N/A 02/09/2014   Procedure: APPENDECTOMY LAPAROSCOPIC;  Surgeon: Leighton Ruff, MD;  Location: [facility];  Service: General;  Laterality: N/A;  . LEFT HEART CATHETERIZATION WITH CORONARY ANGIOGRAM N/A 11/15/2014   Procedure:  LEFT HEART CATHETERIZATION WITH CORONARY ANGIOGRAM;  Surgeon: Belva Crome, MD;  Location[facility] CATH LAB;  Service: Cardiovascular;  Laterality: N/A;  . ROTATOR CUFF REPAIR     left     Current Outpatient Prescriptions  Medication Sig Dispense Refill  . acetaminophen (TYLENOL) 650 MG CR tablet Take 1,300 mg by mouth every 8 (eight) hours as needed for pain.     [name] allopurinol (ZYLOPRIM) 100 MG tablet Take 2 tablets (200 mg total) by mouth daily. 60 tablet 11  . amiodarone (PACERONE) 200 MG tablet Take 1 tablet (200 mg total) by mouth daily. 30 tablet 11  . apixaban (ELIQUIS) 5 MG TABS tablet Take 1 tablet (5 mg total) by mouth 2 (two) times daily. 180 tablet 3  . atorvastatin (LIPITOR) 40 MG tablet Take 1 tablet (40 mg total) by mouth daily. 90 tablet 2  . Blood Glucose Monitoring Suppl (ONE TOUCH ULTRA 2) W/DEVICE KIT Use as directed Dx E11.9 1 each 0  . Blood Pressure Monitor KIT Use to check blood pressure daily Dx I10 1 each 0  . Cholecalciferol 1000 UNITS TBDP Take 1,000-5,000 Units by mouth daily. Takes 1000 units everyday except for on Saturday patient takes 5000 units    . diltiazem (CARDIZEM CD) 180 MG 24 hr capsule Take 1 capsule (180 mg total) by mouth daily. 30 capsule 10  . fenofibrate 160 MG tablet TAKE 1 TABLET(160 MG) BY MOUTH DAILY 30 tablet 11  . ferrous sulfate 325 (65 FE) MG tablet Take 325 mg by mouth 3 (three) times daily with meals.    . furosemide (LASIX) 20 MG tablet Take 1 tablet (20 mg total) by mouth daily. 90 tablet 3  . glipiZIDE (GLUCOTROL) 10 MG tablet Take 10 mg by mouth daily.    [name] glucose blood (ONE TOUCH ULTRA TEST) test strip 1 each by Other route 4 (four) times daily. Use to check blood sugar four times a day  Dx: E11.9- insulin Dependant 120 each 5  . HUMIRA PEN 40 MG/0.8ML PNKT Inject 40 mg into the skin every 14 (fourteen) days.    [name] HYDROcodone-acetaminophen (NORCO/VICODIN) 5-325 MG tablet Take 1 tablet by mouth 2 (two) times daily as needed for  moderate pain or severe pain. 100 tablet 0  . Insulin Glargine (TOUJEO SOLOSTAR) 300 UNIT/ML SOPN Inject 50 Units into the skin daily. Titrate up by 1 unit every 1-2 days for goal sugars of 120-150 6 mL 11  . Insulin Pen Needle 31G X 5 MM MISC 1 Units by Does not apply route 4 (four) times daily. 150 each 11  . Lancets (ONETOUCH ULTRASOFT) lancets Use to check blood sugars daily 30 each 11  . levothyroxine (SYNTHROID, LEVOTHROID) 150 MCG tablet TAKE 1 TABLET BY MOUTH DAILY BEFORE BREAKFAST 90 tablet 3  . metoprolol (LOPRESSOR) 50 MG tablet TAKE 1 TABLET(50 MG) BY MOUTH TWICE DAILY 180 tablet 1  . omeprazole (PRILOSEC) 40 MG capsule TAKE 1 CAPSULE BY MOUTH EVERY DAY  90 capsule 3  . valsartan (DIOVAN) 160 MG tablet Take 1 tablet (160 mg total) by mouth daily. 30 tablet 11   No current facility-administered medications for this visit.     Allergies:   Percocet [oxycodone-acetaminophen]; Invokana [canagliflozin]; and Metformin and related    Social History:  The patient  reports that he quit smoking about 10 years ago. His smoking use included Cigarettes. He has a 33.60 pack-year smoking history. He has never used smokeless tobacco. He reports that he drinks about 0.6 oz of alcohol per week . He reports that he does not use drugs.   Family History:  The patient's family history includes COPD in his sister; Cancer in his brother; Diabetes in his father; Heart attack in his father; Heart disease in his brother; Hypertension in his father and mother; Lung cancer in his father; Stroke in his father and mother.    ROS:  Please see the history of present illness.   Otherwise, review of systems are positive for none.   All other systems are reviewed and negative.    PHYSICAL EXAM: VS:  BP (!) 164/60   Pulse 68   Ht 5' 11"  (3.500 m)   Wt 262 lb 9.6 oz (119.1 kg)   BMI 36.63 kg/m  , BMI Body mass index is 36.63 kg/m. GEN: Well nourished, well developed, in no acute distress  HEENT: normal  Neck:  no JVD, carotid bruits, or masses Cardiac: RRR; no  rubs, or gallops,no edema . There is 3/6 crescendo decrescendo systolic murmur in the aortic area which is mid peaking with slightly diminished S2 Respiratory:  clear to auscultation bilaterally, normal work of breathing GI: soft, nontender, nondistended, + BS MS: no deformity or atrophy  Skin: warm and dry, no rash Neuro:  Strength and sensation are intact Psych: euthymic mood, full affect Vascular: Radial pulses +1 on the right and +2 on the left.  EKG:  EKG is not ordered today.    Recent Labs: 09/17/2015: TSH 1.89 05/12/2016: ALT 21 05/18/2016: BUN 26; Creatinine, Ser 1.55; Hemoglobin 10.9; Platelets 208.0; Potassium 4.2; Sodium 139    Lipid Panel    Component Value Date/Time   CHOL 100 03/20/2015 1003   TRIG (H) 03/20/2015 1003    795.0 Triglyceride is over 400; calculations on Lipids are invalid.   HDL 22.30 (L) 03/20/2015 1003   CHOLHDL 4 03/20/2015 1003   VLDL UNABLE TO CALCULATE IF TRIGLYCERIDE OVER 400 mg/dL 10/12/2014 0248   LDLCALC UNABLE TO CALCULATE IF TRIGLYCERIDE OVER 400 mg/dL 10/12/2014 0248   LDLDIRECT 26.0 03/20/2015 1003      Wt Readings from Last 3 Encounters:  06/16/16 262 lb 9.6 oz (119.1 kg)  06/08/16 261 lb (118.4 kg)  06/05/16 266 lb 3.2 oz (120.7 kg)      No flowsheet data found.    ASSESSMENT AND PLAN:  1.  Exertional dyspnea: The patient reports worsening dyspnea since August which has been attributed to his pneumonia. He does have a cardiac murmur suggestive of aortic stenosis with no recent evaluation. Thus, I requested an echocardiogram.  2. Right subclavian stenosis: Appears to be asymptomatic. Continue medical therapy.  3. Moderate coronary artery disease: No clear angina.  4. Paroxysmal atrial fibrillation: Continue anticoagulation with Eliquis.he remains on amiodarone 200 mg once daily.  5. Bilateral carotid stenosis: Most recently evaluated in May 2017. Repeat study in one  year.  Disposition:   FU with me in 1 year  Signed,  Kathlyn Sacramento, MD  06/16/2016  10:30 AM    [facility]

## 2016-06-17 ENCOUNTER — Telehealth: Payer: Self-pay | Admitting: Pulmonary Disease

## 2016-06-17 NOTE — Telephone Encounter (Signed)
Pt called asking if he can go back on Huimira. Pt was taken off in august 17 during an episode of PNA Recent CXR does not show any acute infiltrate.  Told wife that it is ok from our perspective to go back on Humira. They will follow with the rheumatologist.

## 2016-06-17 NOTE — Telephone Encounter (Signed)
lmtcb x3 for pt's wife 

## 2016-06-23 ENCOUNTER — Ambulatory Visit: Payer: Medicare Other | Admitting: Cardiovascular Disease

## 2016-06-24 ENCOUNTER — Other Ambulatory Visit: Payer: Self-pay | Admitting: Cardiovascular Disease

## 2016-06-25 ENCOUNTER — Ambulatory Visit (AMBULATORY_SURGERY_CENTER): Payer: Medicare Other | Admitting: Internal Medicine

## 2016-06-25 ENCOUNTER — Other Ambulatory Visit: Payer: Self-pay

## 2016-06-25 ENCOUNTER — Encounter: Payer: Self-pay | Admitting: Internal Medicine

## 2016-06-25 VITALS — BP 128/64 | HR 54 | Temp 97.1°F | Resp 14 | Ht 71.0 in | Wt 262.0 lb

## 2016-06-25 DIAGNOSIS — Z1211 Encounter for screening for malignant neoplasm of colon: Secondary | ICD-10-CM | POA: Diagnosis present

## 2016-06-25 DIAGNOSIS — D122 Benign neoplasm of ascending colon: Secondary | ICD-10-CM | POA: Diagnosis not present

## 2016-06-25 DIAGNOSIS — I10 Essential (primary) hypertension: Secondary | ICD-10-CM | POA: Diagnosis not present

## 2016-06-25 DIAGNOSIS — Z1212 Encounter for screening for malignant neoplasm of rectum: Secondary | ICD-10-CM

## 2016-06-25 DIAGNOSIS — E039 Hypothyroidism, unspecified: Secondary | ICD-10-CM | POA: Diagnosis not present

## 2016-06-25 DIAGNOSIS — Z86010 Personal history of colonic polyps: Secondary | ICD-10-CM | POA: Diagnosis not present

## 2016-06-25 DIAGNOSIS — D120 Benign neoplasm of cecum: Secondary | ICD-10-CM | POA: Diagnosis not present

## 2016-06-25 DIAGNOSIS — I4891 Unspecified atrial fibrillation: Secondary | ICD-10-CM | POA: Diagnosis not present

## 2016-06-25 DIAGNOSIS — E119 Type 2 diabetes mellitus without complications: Secondary | ICD-10-CM | POA: Diagnosis not present

## 2016-06-25 LAB — GLUCOSE, CAPILLARY
GLUCOSE-CAPILLARY: 131 mg/dL — AB (ref 65–99)
Glucose-Capillary: 92 mg/dL (ref 65–99)

## 2016-06-25 MED ORDER — METOPROLOL TARTRATE 50 MG PO TABS: ORAL_TABLET | ORAL | 3 refills | Status: DC

## 2016-06-25 MED ORDER — SODIUM CHLORIDE 0.9 % IV SOLN: 500.0000 mL | INTRAVENOUS | Status: DC

## 2016-06-25 NOTE — Progress Notes (Signed)
To PACU, vss patent aw report to rn 

## 2016-06-25 NOTE — Op Note (Signed)
[place] Patient Name: Michael [name] Procedure Date: 06/25/2016 2:52 PM MRN: [id number] Endoscopist: Gatha Mayer , MD Age: 75 Referring MD:  Date of Birth: 1941/02/21 Gender: Male Account #: [id number] Procedure:                Colonoscopy Indications:              Suspected adenomatous polyps in the colon, For                            therapy of adenomatous polyps in the colon Medicines:                Propofol per Anesthesia, Monitored Anesthesia Care Procedure:                Pre-Anesthesia Assessment:                           - Prior to the procedure, a History and Physical                            was performed, and patient medications and                            allergies were reviewed. The patient's tolerance of                            previous anesthesia was also reviewed. The risks                            and benefits of the procedure and the sedation                            options and risks were discussed with the patient.                            All questions were answered, and informed consent                            was obtained. Prior Anticoagulants: The patient                            last took Eliquis (apixaban) 3 days prior to the                            procedure. ASA Grade Assessment: III - A patient                            with severe systemic disease. After reviewing the                            risks and benefits, the patient was deemed in                            satisfactory condition to undergo the procedure.  After obtaining informed consent, the colonoscope                            was passed under direct vision. Throughout the                            procedure, the patient's blood pressure, pulse, and                            oxygen saturations were monitored continuously. The                            Model CF-HQ190L [id number]) scope was introduced                             through the anus and advanced to the the cecum,                            identified by appendiceal orifice and ileocecal                            valve. The colonoscopy was performed without                            difficulty. The patient tolerated the procedure                            well. The quality of the bowel preparation was                            good. The ileocecal valve, appendiceal orifice, and                            rectum were photographed. Scope In: 2:58:15 PM Scope Out: 3:23:39 PM Scope Withdrawal Time: 0 hours 20 minutes 40 seconds  Total Procedure Duration: 0 hours 25 minutes 24 seconds  Findings:                 The perianal and digital rectal examinations were                            normal.                           A 18 mm polyp was found in the ascending colon. The                            polyp was sessile. The polyp was removed with a hot                            snare. Resection and retrieval were complete.                            Verification of patient identification for the  specimen was done. Estimated blood loss: none. To                            prevent bleeding after the polypectomy, two                            hemostatic clips were successfully placed (MR                            conditional). There was no bleeding during, or at                            the end, of the procedure.                           A 2 mm polyp was found in the cecum. The polyp was                            sessile. The polyp was removed with a cold biopsy                            forceps. Resection and retrieval were complete.                            Verification of patient identification for the                            specimen was done. Estimated blood loss was minimal.                           The exam was otherwise without abnormality on                            direct and retroflexion views.                            Polyps were seen/suspected on prior exam in sigmoid                            colon but those few diminutive polyps not seen on                            this exam despite multiple passes - there were some                            small submucosal bulges but no discrete polyps. Complications:            No immediate complications. Estimated Blood Loss:     Estimated blood loss was minimal. Impression:               - One 18 mm polyp in the ascending colon, removed  with a hot snare. Resected and retrieved. Clips (MR                            conditional) were placed given need for eliquis                           - One 2 mm polyp in the cecum, removed with a cold                            biopsy forceps. Resected and retrieved.                           - The examination was otherwise normal on direct                            and retroflexion views. Recommendation:           - Patient has a contact number available for                            emergencies. The signs and symptoms of potential                            delayed complications were discussed with the                            patient. Return to normal activities tomorrow.                            Written discharge instructions were provided to the                            patient.                           - Resume previous diet.                           - Continue present medications.                           - Resume Eliquis (apixaban) at prior dose in 3 days.                           - Repeat colonoscopy is recommended for                            surveillance. The colonoscopy date will be                            determined after pathology results from today's                            exam become available for review. Gatha Mayer, MD 06/25/2016 3:39:27 PM This report has been signed electronically.

## 2016-06-25 NOTE — Patient Instructions (Signed)
YOU HAD AN ENDOSCOPIC PROCEDURE TODAY AT [facility]:   Refer to the procedure report that was given to you for any specific questions about what was found during the examination.  If the procedure report does not answer your questions, please call your gastroenterologist to clarify.  If you requested that your care partner not be given the details of your procedure findings, then the procedure report has been included in a sealed envelope for you to review at your convenience later.  YOU SHOULD EXPECT: Some feelings of bloating in the abdomen. Passage of more gas than usual.  Walking can help get rid of the air that was put into your GI tract during the procedure and reduce the bloating. If you had a lower endoscopy (such as a colonoscopy or flexible sigmoidoscopy) you may notice spotting of blood in your stool or on the toilet paper. If you underwent a bowel prep for your procedure, you may not have a normal bowel movement for a few days.  Please Note:  You might notice some irritation and congestion in your nose or some drainage.  This is from the oxygen used during your procedure.  There is no need for concern and it should clear up in a day or so.  SYMPTOMS TO REPORT IMMEDIATELY:   Following lower endoscopy (colonoscopy or flexible sigmoidoscopy):  Excessive amounts of blood in the stool  Significant tenderness or worsening of abdominal pains  Swelling of the abdomen that is new, acute  Fever of 100F or higher   Following upper endoscopy (EGD)  Vomiting of blood or coffee ground material  New chest pain or pain under the shoulder blades  Painful or persistently difficult swallowing  New shortness of breath  Fever of 100F or higher  Black, tarry-looking stools  For urgent or emergent issues, a gastroenterologist can be reached at any hour by calling [phone].   DIET:  We do recommend a small meal at first, but then you may proceed to your regular diet.  Drink  plenty of fluids but you should avoid alcoholic beverages for 24 hours.  ACTIVITY:  You should plan to take it easy for the rest of today and you should NOT DRIVE or use heavy machinery until tomorrow (because of the sedation medicines used during the test).    FOLLOW UP: Our staff will call the number listed on your records the next business day following your procedure to check on you and address any questions or concerns that you may have regarding the information given to you following your procedure. If we do not reach you, we will leave a message.  However, if you are feeling well and you are not experiencing any problems, there is no need to return our call.  We will assume that you have returned to your regular daily activities without incident.  If any biopsies were taken you will be contacted by phone or by letter within the next 1-3 weeks.  Please call us at [phone] if you have not heard about the biopsies in 3 weeks.    SIGNATURES/CONFIDENTIALITY: You and/or your care partner have signed paperwork which will be entered into your electronic medical record.  These signatures attest to the fact that that the information above on your After Visit Summary has been reviewed and is understood.  Full responsibility of the confidentiality of this discharge information lies with you and/or your care-partner.   Information on polyps given to you today  A card  to carry in your wallet that says you have 2 clips in ascending colon was given to you today  Resume Eliquis on Dec 4th

## 2016-06-25 NOTE — Progress Notes (Signed)
Called to room to assist during endoscopic procedure.  Patient ID and intended procedure confirmed with present staff. Received instructions for my participation in the procedure from the performing physician.  

## 2016-06-26 ENCOUNTER — Telehealth: Payer: Self-pay

## 2016-06-26 NOTE — Telephone Encounter (Signed)
  Follow up Call-  Call back number 06/25/2016  Post procedure Call Back phone  # [phone]  Permission to leave phone message Yes  Some recent data might be hidden    Patient was called for follow up after his procedure on 06/25/2016. I spoke with the patients wife and she reports that Michael [name] has returned to his normal daily activities without any complications.

## 2016-06-30 ENCOUNTER — Telehealth: Payer: Self-pay | Admitting: Nurse Practitioner

## 2016-06-30 NOTE — Telephone Encounter (Signed)
Wife called to get results of last CT.  Gave Charlottes response.  Wife is requesting call back.  States patient is still having dizzy spells.  States they are worse when he bends over.

## 2016-07-01 NOTE — Telephone Encounter (Signed)
Left vm for pt to call back, if dizziness continue, pt might need to follow up with PCP.

## 2016-07-02 ENCOUNTER — Encounter: Payer: Self-pay | Admitting: Internal Medicine

## 2016-07-02 DIAGNOSIS — Z86010 Personal history of colonic polyps: Secondary | ICD-10-CM

## 2016-07-02 DIAGNOSIS — Z860101 Personal history of adenomatous and serrated colon polyps: Secondary | ICD-10-CM | POA: Insufficient documentation

## 2016-07-02 HISTORY — DX: Personal history of colonic polyps: Z86.010

## 2016-07-02 HISTORY — DX: Personal history of adenomatous and serrated colon polyps: Z86.0101

## 2016-07-02 NOTE — Progress Notes (Signed)
2 tubular adenomas max 18 mm Recall colonoscopy 2020

## 2016-07-03 NOTE — Telephone Encounter (Signed)
Spoke with pt and his spouse, advise to change position slowly to prevent dizziness. And consider making an appt with Dr. Camila Li for further evaluation.

## 2016-07-06 ENCOUNTER — Telehealth: Payer: Self-pay | Admitting: Internal Medicine

## 2016-07-06 NOTE — Telephone Encounter (Signed)
I reviewed the results of the letter with the patient.  They will call back for any additional questions or concerns.

## 2016-07-07 ENCOUNTER — Ambulatory Visit (HOSPITAL_COMMUNITY)
Admission: RE | Admit: 2016-07-07 | Discharge: 2016-07-07 | Disposition: A | Discharge status: Home / Self Care | Payer: Medicare Other | Source: Ambulatory Visit | Attending: Internal Medicine | Admitting: Internal Medicine

## 2016-07-07 ENCOUNTER — Ambulatory Visit (INDEPENDENT_AMBULATORY_CARE_PROVIDER_SITE_OTHER)
Admission: RE | Admit: 2016-07-07 | Discharge: 2016-07-07 | Disposition: A | Discharge status: Home / Self Care | Payer: Medicare Other | Source: Ambulatory Visit | Attending: Internal Medicine | Admitting: Internal Medicine

## 2016-07-07 ENCOUNTER — Other Ambulatory Visit: Payer: Self-pay | Admitting: Nurse Practitioner

## 2016-07-07 ENCOUNTER — Ambulatory Visit (INDEPENDENT_AMBULATORY_CARE_PROVIDER_SITE_OTHER): Payer: Medicare Other | Admitting: Internal Medicine

## 2016-07-07 ENCOUNTER — Encounter: Payer: Self-pay | Admitting: Internal Medicine

## 2016-07-07 VITALS — BP 140/60 | HR 58 | Ht 71.0 in | Wt 260.0 lb

## 2016-07-07 DIAGNOSIS — I10 Essential (primary) hypertension: Secondary | ICD-10-CM

## 2016-07-07 DIAGNOSIS — R42 Dizziness and giddiness: Secondary | ICD-10-CM

## 2016-07-07 DIAGNOSIS — R0602 Shortness of breath: Secondary | ICD-10-CM | POA: Diagnosis not present

## 2016-07-07 DIAGNOSIS — J189 Pneumonia, unspecified organism: Secondary | ICD-10-CM

## 2016-07-07 DIAGNOSIS — J181 Lobar pneumonia, unspecified organism: Secondary | ICD-10-CM | POA: Diagnosis not present

## 2016-07-07 DIAGNOSIS — I6523 Occlusion and stenosis of bilateral carotid arteries: Secondary | ICD-10-CM

## 2016-07-07 LAB — PULMONARY FUNCTION TEST
DL/VA % PRED: 87 %
DL/VA: 4.07 ml/min/mmHg/L
DLCO UNC % PRED: 67 %
DLCO UNC: 22.66 ml/min/mmHg
FEF 25-75 POST: 2.29 L/s
FEF 25-75 PRE: 2.34 L/s
FEF2575-%CHANGE-POST: -1 %
FEF2575-%PRED-POST: 96 %
FEF2575-%PRED-PRE: 98 %
FEV1-%Change-Post: -4 %
FEV1-%Pred-Post: 78 %
FEV1-%Pred-Pre: 81 %
FEV1-Post: 2.53 L
FEV1-Pre: 2.65 L
FEV1FVC-%CHANGE-POST: -5 %
FEV1FVC-%PRED-PRE: 102 %
FEV6-%CHANGE-POST: 3 %
FEV6-%PRED-POST: 85 %
FEV6-%Pred-Pre: 83 %
FEV6-PRE: 3.48 L
FEV6-Post: 3.59 L
FEV6FVC-%CHANGE-POST: 0 %
FEV6FVC-%PRED-PRE: 105 %
FEV6FVC-%Pred-Post: 106 %
FVC-%CHANGE-POST: 1 %
FVC-%PRED-POST: 80 %
FVC-%Pred-Pre: 79 %
FVC-Post: 3.59 L
FVC-Pre: 3.53 L
POST FEV1/FVC RATIO: 71 %
PRE FEV1/FVC RATIO: 75 %
Post FEV6/FVC ratio: 100 %
Pre FEV6/FVC Ratio: 99 %
RV % pred: 141 %
RV: 3.64 L
TLC % pred: 97 %
TLC: 7.06 L

## 2016-07-07 MED ORDER — ALBUTEROL SULFATE (2.5 MG/3ML) 0.083% IN NEBU
2.5000 mg | INHALATION_SOLUTION | Freq: Once | RESPIRATORY_TRACT | Status: AC
  Administered 2016-07-07: 2.5 mg via RESPIRATORY_TRACT

## 2016-07-07 MED ORDER — MECLIZINE HCL 12.5 MG PO TABS: 12.5000 mg | ORAL_TABLET | Freq: Two times a day (BID) | ORAL | 0 refills | Status: DC | PRN

## 2016-07-07 NOTE — Progress Notes (Signed)
Subjective:    Patient ID: Michael [name], male    DOB: 01-Dec-1940,    MRN: [id number]     Brief patient profile:  59 yowm quit smoking 2007 with no evidence copd by pfts 01/08/15  maint on humira for psoriasis/arthritis  with dx of outpt pna but completely recovered symptomatically then abruptly ill and admit with CAP presented with Left lower cp/ weakness  And  Temp 102> admit with dx CAP  Admit date: 03/17/2016  Discharge date: 03/21/2016   Brief history of present illness from the day of admission and additional interim summary    Michael Morera Jonesis a 75 y.o.malewith medical history significant of psoriasis on Humira, hypertension, hyperlipidemia, diabetes mellitus, GERD, hypothyroidism, gout, PAF on Eliquis, CAd, AVM of colon, CKD-III,who presents with shortness, cough, fever, chills, rash.   Patient states that he has been having chest pain, dry cough, SOB,fever, chills in the past 3 days. His chest pain is located in the left side of chest, constant, moderate, radiating to the back, sharp, pleuritic. It is aggravated by coughing and deep breath. No tenderness over calf areas. Patient denies nausea, vomiting, diarrhea, abdominal pain, symptoms of UTI or unilateral weakness. Pt notice somescatteredpimple rashes in legs and arms recently.  ED Course:pt was found to have WBC 10.8, temperature 102.6, no tachycardia, has tachypnea, oxygen saturation 93% on room air, lactate 1.90, troponin negative, was seen in renal function, sodium 131. Chest x-ray showed infiltration left upper lobe.  Hospital issues addressed    1. CAP with Sepsis - Patient is immunocompromised as he is on Humira, seems to have responded well to empiric IV antibiotics which are Vancomycin, Zosyn and Doxy, clinically improved, sepsis physiology has resolved after IV fluids, lactate normalized and today he is completely symptom-free off oxygen, eager to go home, able to stand up and walk by himself does not want  home health PT RN .Will transition antibiotics to oral Vantin and doxycycline for 5 more days with follow-up with PCP for repeat CBC, BMP and a 2 view chest x-ray in the next 3-5 days.  2. Paroxysmal atrial fibrillation [place] vasc 2 score of 3. Continue Eliquis, beta blocker, Cardizem and amiodarone combination. Stable blood pressure and rate.  3. Dyslipidemia. Continue statin.  4. Psoriasis. On Humira outpatient.  5. Hypothyroidism. Continue Synthroid.  6. Essential hypertension. Renew home regimen except hold lisinopril for another day due to recent ARF.  7. GERD. On PPI.  8. ARF on CKD 3. Baseline creatinine close to 1.5. Now at baseline after hydration.  9. Gout. On allopurinol.   10. Hyponatremia due to dehydration from sepsis. Proved with hydration and repeat BMP in 3-4 days by PCP.  11. Diarrhea on 03/19/2016. No further stools in the last 12 hours on 03/20/2016, stool studies suggest C. difficile colonization, diarrhea has completely resolved in the last 24 hours.   12. Anemia of chronic disease. Dilutional fall from IV fluids, outpatient monitor in one week. Outpatient workup by PCP as needed.   13. DM type II. Resume home regimen follow with PCP.   Discharge diagnosis     Principal Problem:   CAP (community acquired pneumonia) Active Problems:   DM (diabetes mellitus) ([place])   Iron deficiency anemia due to chronic blood loss   HTN (hypertension), benign   Gout   Hypertriglyceridemia   Psoriasis   PAF (paroxysmal atrial fibrillation) ([facility])   Hyperlipidemia   Hypothyroidism   Acute renal failure superimposed on stage 3 chronic kidney disease (  [place])   Sepsis ([employer])   Rash    Discharge instructions        Discharge Instructions    Discharge instructions    Complete by:  As directed   Follow with Primary MD Michael Kehr, MD in 2-3 days   Get CBC, CMP, 2 view Chest X ray checked  by Primary MD or SNF MD in 5-7 days ( we routinely change  or add medications that can affect your baseline labs and fluid status, therefore we recommend that you get the mentioned basic workup next visit with your PCP, your PCP may decide not to get them or add new tests based on their clinical decision)    05/08/2016 1st [facility] office visit/ Michael [name]   Chief Complaint  Patient presents with  . Advice Only    self referral for abn cxr after being treated for PNA>  denies any breathing complaints at this time.   Took last dose of Humira one week p discharge and then dermatologist rec hold further dosing  L cp was never really pleuritic and never really coughed up purulent sputum but L post cp and fever resolved on abx  Not limited by breathing from desired activities   rec Please remember to go to the lab and x-ray department downstairs for your tests - we will call you with the results when they are available. Definitely leave off humira for as long as you can unless the arthritis/ psoriasis get a lot worse than now     06/05/2016  f/u ov/Michael [name] re: HCAP  Chief Complaint  Patient presents with  . Follow-up    Pt. states he has been getting short winded lately, Pt. denies coughing,chest pain,  doe = MMRC2 = can't walk a nl pace on a flat grade s sob but does fine slow and flat eg shopping rec Stop prinivil now Valsartan 160 mg one daily in place of prinivil Work on weight loss      07/07/2016  f/u ov/Michael [name] re:  HCAP/sob with  pfts ok x low erv / still on amiodarone  Chief Complaint  Patient presents with  . Follow-up    PFT's done today. Breathing has improved some. No new co's.    improving ex tol back to baseline   No obvious day to day or daytime variability or assoc excess/ purulent sputum or mucus plugs or hemoptysis or cp or chest tightness, subjective wheeze or overt sinus or hb symptoms. No unusual exp hx or h/o childhood pna/ asthma or knowledge of premature birth.  Sleeping ok without nocturnal  or early am exacerbation   of respiratory  c/o's or need for noct saba. Also denies any obvious fluctuation of symptoms with weather or environmental changes or other aggravating or alleviating factors except as outlined above   Current Medications, Allergies, Complete Past Medical History, Past Surgical History, Family History, and Social History were reviewed in [employer] record.  ROS  The following are not active complaints unless bolded sore throat, dysphagia, dental problems, itching, sneezing,  nasal congestion or excess/ purulent secretions, ear ache,   fever, chills, sweats, unintended wt loss, classically pleuritic or exertional cp,  orthopnea pnd or leg swelling, presyncope, palpitations, abdominal pain, anorexia, nausea, vomiting, diarrhea  or change in bowel or bladder habits, change in stools or urine, dysuria,hematuria,  rash, arthralgias, visual complaints, headache, numbness, weakness or ataxia or problems with walking or coordination,  change in mood/affect or memory.  Objective:   Physical Exam  amb wm nad  07/07/2016    261   06/05/2016    266   05/08/16 256 lb 6.4 oz (116.3 kg)  04/17/16 255 lb (115.7 kg)  03/24/16 253 lb (114.8 kg)    Vital signs reviewed   - Note on arrival 02 sats  97% on RA     HEENT: nl   turbinates, and oropharynx. Nl external ear canals without cough reflex- edentulous   NECK :  without JVD/Nodes/TM/ nl carotid upstrokes bilaterally   LUNGS: no acc muscle use,  Nl contour chest which is clear to A and P bilaterally without cough on insp or exp maneuvers   CV:  RRR  no s3 or murmur or increase in P2, no edema   ABD:  soft and nontender with nl inspiratory excursion in the supine position. No bruits or organomegaly, bowel sounds nl  MS:  Nl gait/ ext warm without deformities, calf tenderness, cyanosis or clubbing No obvious joint restrictions   SKIN: warm and dry without lesions    NEURO:  alert, approp, nl sensorium  with  no motor deficits   CXR PA and Lateral:   07/07/2016 :    I personally reviewed images and agree with radiology impression as follows:    Improved opacities in the lingula with residual linear densities, likely scarring. No active disease.                 Assessment & Plan:   Outpatient Encounter Prescriptions as of 07/07/2016  Medication Sig  . acetaminophen (TYLENOL) 650 MG CR tablet Take 1,300 mg by mouth every 8 (eight) hours as needed for pain.   [name] allopurinol (ZYLOPRIM) 100 MG tablet Take 2 tablets (200 mg total) by mouth daily.  [name] amiodarone (PACERONE) 200 MG tablet TAKE 1 TABLET BY MOUTH DAILY  . apixaban (ELIQUIS) 5 MG TABS tablet Take 1 tablet (5 mg total) by mouth 2 (two) times daily.  [name] atorvastatin (LIPITOR) 40 MG tablet Take 1 tablet (40 mg total) by mouth daily.  . Blood Glucose Monitoring Suppl (ONE TOUCH ULTRA 2) W/DEVICE KIT Use as directed Dx E11.9  . Blood Pressure Monitor KIT Use to check blood pressure daily Dx I10  . Cholecalciferol 1000 UNITS TBDP Take 1,000-5,000 Units by mouth daily. Takes 1000 units everyday except for on Saturday patient takes 5000 units  . diltiazem (CARDIZEM CD) 180 MG 24 hr capsule Take 1 capsule (180 mg total) by mouth daily.  . fenofibrate 160 MG tablet TAKE 1 TABLET(160 MG) BY MOUTH DAILY  . ferrous sulfate 325 (65 FE) MG tablet Take 325 mg by mouth 3 (three) times daily with meals.  . furosemide (LASIX) 20 MG tablet Take 1 tablet (20 mg total) by mouth daily.  [name] glipiZIDE (GLUCOTROL) 10 MG tablet Take 10 mg by mouth daily.  [name] glucose blood (ONE TOUCH ULTRA TEST) test strip 1 each by Other route 4 (four) times daily. Use to check blood sugar four times a day  Dx: E11.9- insulin Dependant  . HUMIRA PEN 40 MG/0.8ML PNKT Inject 40 mg into the skin every 14 (fourteen) days.  [name] HYDROcodone-acetaminophen (NORCO/VICODIN) 5-325 MG tablet Take 1 tablet by mouth 2 (two) times daily as needed for moderate pain or severe pain.  . Insulin  Glargine (TOUJEO SOLOSTAR) 300 UNIT/ML SOPN Inject 50 Units into the skin daily. Titrate up by 1 unit every 1-2 days for goal sugars of 120-150  . Insulin Pen Needle 31G  X 5 MM MISC 1 Units by Does not apply route 4 (four) times daily.  . Lancets (ONETOUCH ULTRASOFT) lancets Use to check blood sugars daily  . levothyroxine (SYNTHROID, LEVOTHROID) 150 MCG tablet TAKE 1 TABLET BY MOUTH DAILY BEFORE BREAKFAST  . metoprolol (LOPRESSOR) 50 MG tablet TAKE 1 TABLET(50 MG) BY MOUTH TWICE DAILY  . omeprazole (PRILOSEC) 40 MG capsule TAKE 1 CAPSULE BY MOUTH EVERY DAY  . valsartan (DIOVAN) 160 MG tablet Take 1 tablet (160 mg total) by mouth daily.   Facility-Administered Encounter Medications as of 07/07/2016  Medication  . 0.9 %  sodium chloride infusion

## 2016-07-07 NOTE — Assessment & Plan Note (Addendum)
Amiodarone rx since at least 11/16/14 per EPIC med records PFTs  01/08/15   FVC 3.73 (82%)  With Dlco 69 corrects to 87% for alv vol - 06/05/2016  Walked RA x 2 laps @ 185 ft each stopped due to sob with sats 90% slow pace - trial off acei 06/05/2016 >>> - PFT's  07/07/2016  FVC 3.53. ( 79%) with ERV 23  with DLCO  67 % corrects to 87 % for alv volume and ERV = 23 %  Improved off acei - see hbp a/p  No evidence of amiodarone tox, copd or asthma or any primary lung dz but main issue is low erv due to wt (see separate a/p)    I had an extended final summary discussion with the patient/wife  reviewing all relevant studies completed to date and  lasting 15 to 20 minutes of a 25 minute visit on the following issues:    Each maintenance medication was reviewed in detail including most importantly the difference between maintenance and as needed and under what circumstances the prns are to be used.  Please see AVS for  customized  Instructions which were reviewed in detail in writing with the patient and a copy provided.    Pulmonary f/u can be on a prn basis

## 2016-07-07 NOTE — Patient Instructions (Addendum)
The only abnormality on your pfts is your weight   Weight control is simply a matter of calorie balance which needs to be tilted in your favor by eating less and exercising more.  To get the most out of exercise, you need to be continuously aware that you are short of breath, but never out of breath, for 30 minutes daily. As you improve, it will actually be easier for you to do the same amount of exercise  in  30 minutes so always push to the level where you are short of breath.  If this does not result in gradual weight reduction then I strongly recommend you see a nutritionist with a food diary x 2 weeks so that we can work out a negative calorie balance which is universally effective in steady weight loss programs.  Think of your calorie balance like you do your bank account where in this case you want the balance to go down so you must take in less calories than you burn up.  It's just that simple:  Hard to do, but easy to understand.  Good luck!   Please remember to go to the  x-ray department downstairs for your tests - we will call you with the results when they are available.   If you are satisfied with your treatment plan,  let your doctor know and he/she can either refill your medications or you can return here when your prescription runs out.     If in any way you are not 100% satisfied,  please tell us.  If 100% better, tell your friends!  Pulmonary follow up is as needed

## 2016-07-07 NOTE — Progress Notes (Signed)
Spoke with pt's spouse and notified of results per Dr. Wert. Pt verbalized understanding and denied any questions. 

## 2016-07-08 ENCOUNTER — Encounter: Payer: Self-pay | Admitting: Internal Medicine

## 2016-07-08 NOTE — Assessment & Plan Note (Signed)
On amlodipine, cardizem - trial off acei 06/05/2016 due to doe not fully explained > improved 07/07/2016  On diovan 160 mg daily   Although even in retrospect it may not be clear the ACEi contributed to the pt's symptoms,  Pt improved off them and adding them back at this point or in the future would risk confusion in interpretation of non-specific respiratory symptoms to which this patient is prone  ie  Better not to muddy the waters here.   Follow up per Primary Care planned

## 2016-07-08 NOTE — Assessment & Plan Note (Signed)
03/17/16 Probably  Lingular > resolved 90% radiographically 05/08/2016  - ESR 05/08/2016 = 3   - 06/05/2016 minimal residual changes lingula > resolved 07/07/2016   No further pulmonary follow-up needed for this problem.

## 2016-07-08 NOTE — Assessment & Plan Note (Signed)
Body mass index is 36.26  .  Lab Results  Component Value Date   TSH 1.89 09/17/2015     Contributing to gerd tendency/ doe/reviewed the need and the process to achieve and maintain neg calorie balance > defer f/u primary care including intermittently monitoring thyroid status

## 2016-07-10 ENCOUNTER — Telehealth: Payer: Self-pay

## 2016-07-10 NOTE — Telephone Encounter (Signed)
Called [facility].  Received fax from recent eye exam.

## 2016-07-14 ENCOUNTER — Other Ambulatory Visit: Payer: Self-pay

## 2016-07-14 ENCOUNTER — Ambulatory Visit (HOSPITAL_COMMUNITY): Payer: Medicare Other | Attending: Cardiovascular Disease

## 2016-07-14 DIAGNOSIS — E785 Hyperlipidemia, unspecified: Secondary | ICD-10-CM | POA: Insufficient documentation

## 2016-07-14 DIAGNOSIS — I350 Nonrheumatic aortic (valve) stenosis: Secondary | ICD-10-CM | POA: Insufficient documentation

## 2016-07-14 DIAGNOSIS — R0602 Shortness of breath: Secondary | ICD-10-CM | POA: Insufficient documentation

## 2016-07-14 DIAGNOSIS — I071 Rheumatic tricuspid insufficiency: Secondary | ICD-10-CM | POA: Insufficient documentation

## 2016-07-14 DIAGNOSIS — I10 Essential (primary) hypertension: Secondary | ICD-10-CM | POA: Diagnosis not present

## 2016-07-14 DIAGNOSIS — E119 Type 2 diabetes mellitus without complications: Secondary | ICD-10-CM | POA: Insufficient documentation

## 2016-07-22 ENCOUNTER — Encounter: Payer: Self-pay | Admitting: Internal Medicine

## 2016-07-22 ENCOUNTER — Other Ambulatory Visit: Payer: Self-pay

## 2016-07-22 DIAGNOSIS — I350 Nonrheumatic aortic (valve) stenosis: Secondary | ICD-10-CM

## 2016-07-22 DIAGNOSIS — I272 Pulmonary hypertension, unspecified: Secondary | ICD-10-CM

## 2016-07-22 MED ORDER — FUROSEMIDE 40 MG PO TABS: 40.0000 mg | ORAL_TABLET | Freq: Every day | ORAL | 6 refills | Status: DC

## 2016-07-23 NOTE — Telephone Encounter (Signed)
Notes received and abstracted

## 2016-07-27 ENCOUNTER — Encounter (HOSPITAL_COMMUNITY): Payer: Self-pay | Admitting: Emergency Medicine

## 2016-07-27 ENCOUNTER — Emergency Department (HOSPITAL_COMMUNITY): Payer: Medicare Other

## 2016-07-27 ENCOUNTER — Inpatient Hospital Stay (HOSPITAL_COMMUNITY)
Admission: EM | Admit: 2016-07-27 | Discharge: 2016-08-04 | DRG: 247 | Disposition: A | Discharge status: Home Health | Payer: Medicare Other | Attending: Family Medicine | Admitting: Family Medicine

## 2016-07-27 DIAGNOSIS — K8020 Calculus of gallbladder without cholecystitis without obstruction: Secondary | ICD-10-CM | POA: Diagnosis present

## 2016-07-27 DIAGNOSIS — I6523 Occlusion and stenosis of bilateral carotid arteries: Secondary | ICD-10-CM | POA: Diagnosis present

## 2016-07-27 DIAGNOSIS — I808 Phlebitis and thrombophlebitis of other sites: Secondary | ICD-10-CM | POA: Diagnosis not present

## 2016-07-27 DIAGNOSIS — Z801 Family history of malignant neoplasm of trachea, bronchus and lung: Secondary | ICD-10-CM | POA: Diagnosis not present

## 2016-07-27 DIAGNOSIS — I10 Essential (primary) hypertension: Secondary | ICD-10-CM | POA: Diagnosis not present

## 2016-07-27 DIAGNOSIS — L03114 Cellulitis of left upper limb: Secondary | ICD-10-CM | POA: Diagnosis present

## 2016-07-27 DIAGNOSIS — J189 Pneumonia, unspecified organism: Secondary | ICD-10-CM | POA: Diagnosis not present

## 2016-07-27 DIAGNOSIS — E781 Pure hyperglyceridemia: Secondary | ICD-10-CM | POA: Diagnosis present

## 2016-07-27 DIAGNOSIS — G8929 Other chronic pain: Secondary | ICD-10-CM | POA: Diagnosis present

## 2016-07-27 DIAGNOSIS — M7989 Other specified soft tissue disorders: Secondary | ICD-10-CM | POA: Diagnosis not present

## 2016-07-27 DIAGNOSIS — B9561 Methicillin susceptible Staphylococcus aureus infection as the cause of diseases classified elsewhere: Secondary | ICD-10-CM | POA: Diagnosis not present

## 2016-07-27 DIAGNOSIS — M1990 Unspecified osteoarthritis, unspecified site: Secondary | ICD-10-CM | POA: Diagnosis not present

## 2016-07-27 DIAGNOSIS — D649 Anemia, unspecified: Secondary | ICD-10-CM | POA: Diagnosis not present

## 2016-07-27 DIAGNOSIS — Z7901 Long term (current) use of anticoagulants: Secondary | ICD-10-CM | POA: Diagnosis not present

## 2016-07-27 DIAGNOSIS — I4819 Other persistent atrial fibrillation: Secondary | ICD-10-CM | POA: Diagnosis present

## 2016-07-27 DIAGNOSIS — I272 Pulmonary hypertension, unspecified: Secondary | ICD-10-CM | POA: Diagnosis present

## 2016-07-27 DIAGNOSIS — E785 Hyperlipidemia, unspecified: Secondary | ICD-10-CM | POA: Diagnosis present

## 2016-07-27 DIAGNOSIS — I2510 Atherosclerotic heart disease of native coronary artery without angina pectoris: Secondary | ICD-10-CM | POA: Diagnosis not present

## 2016-07-27 DIAGNOSIS — E875 Hyperkalemia: Secondary | ICD-10-CM | POA: Diagnosis present

## 2016-07-27 DIAGNOSIS — R609 Edema, unspecified: Secondary | ICD-10-CM

## 2016-07-27 DIAGNOSIS — K922 Gastrointestinal hemorrhage, unspecified: Secondary | ICD-10-CM | POA: Diagnosis not present

## 2016-07-27 DIAGNOSIS — Z823 Family history of stroke: Secondary | ICD-10-CM | POA: Diagnosis not present

## 2016-07-27 DIAGNOSIS — Z8249 Family history of ischemic heart disease and other diseases of the circulatory system: Secondary | ICD-10-CM | POA: Diagnosis not present

## 2016-07-27 DIAGNOSIS — Z794 Long term (current) use of insulin: Secondary | ICD-10-CM | POA: Diagnosis not present

## 2016-07-27 DIAGNOSIS — I5031 Acute diastolic (congestive) heart failure: Secondary | ICD-10-CM | POA: Diagnosis not present

## 2016-07-27 DIAGNOSIS — E039 Hypothyroidism, unspecified: Secondary | ICD-10-CM | POA: Diagnosis present

## 2016-07-27 DIAGNOSIS — I6529 Occlusion and stenosis of unspecified carotid artery: Secondary | ICD-10-CM | POA: Diagnosis present

## 2016-07-27 DIAGNOSIS — L02414 Cutaneous abscess of left upper limb: Secondary | ICD-10-CM | POA: Diagnosis present

## 2016-07-27 DIAGNOSIS — I480 Paroxysmal atrial fibrillation: Secondary | ICD-10-CM | POA: Diagnosis present

## 2016-07-27 DIAGNOSIS — Z8619 Personal history of other infectious and parasitic diseases: Secondary | ICD-10-CM | POA: Diagnosis not present

## 2016-07-27 DIAGNOSIS — A0471 Enterocolitis due to Clostridium difficile, recurrent: Secondary | ICD-10-CM | POA: Diagnosis not present

## 2016-07-27 DIAGNOSIS — I130 Hypertensive heart and chronic kidney disease with heart failure and stage 1 through stage 4 chronic kidney disease, or unspecified chronic kidney disease: Secondary | ICD-10-CM | POA: Diagnosis present

## 2016-07-27 DIAGNOSIS — N183 Chronic kidney disease, stage 3 (moderate): Secondary | ICD-10-CM | POA: Diagnosis present

## 2016-07-27 DIAGNOSIS — Z833 Family history of diabetes mellitus: Secondary | ICD-10-CM | POA: Diagnosis not present

## 2016-07-27 DIAGNOSIS — R0602 Shortness of breath: Secondary | ICD-10-CM

## 2016-07-27 DIAGNOSIS — I214 Non-ST elevation (NSTEMI) myocardial infarction: Secondary | ICD-10-CM | POA: Diagnosis not present

## 2016-07-27 DIAGNOSIS — R1011 Right upper quadrant pain: Secondary | ICD-10-CM | POA: Diagnosis not present

## 2016-07-27 DIAGNOSIS — K5521 Angiodysplasia of colon with hemorrhage: Secondary | ICD-10-CM | POA: Diagnosis present

## 2016-07-27 DIAGNOSIS — Z87891 Personal history of nicotine dependence: Secondary | ICD-10-CM | POA: Diagnosis not present

## 2016-07-27 DIAGNOSIS — D61818 Other pancytopenia: Secondary | ICD-10-CM | POA: Diagnosis present

## 2016-07-27 DIAGNOSIS — K5520 Angiodysplasia of colon without hemorrhage: Secondary | ICD-10-CM | POA: Diagnosis present

## 2016-07-27 DIAGNOSIS — B958 Unspecified staphylococcus as the cause of diseases classified elsewhere: Secondary | ICD-10-CM

## 2016-07-27 DIAGNOSIS — E1151 Type 2 diabetes mellitus with diabetic peripheral angiopathy without gangrene: Secondary | ICD-10-CM | POA: Diagnosis not present

## 2016-07-27 DIAGNOSIS — Z5181 Encounter for therapeutic drug level monitoring: Secondary | ICD-10-CM | POA: Diagnosis not present

## 2016-07-27 DIAGNOSIS — K219 Gastro-esophageal reflux disease without esophagitis: Secondary | ICD-10-CM | POA: Diagnosis present

## 2016-07-27 DIAGNOSIS — Z7982 Long term (current) use of aspirin: Secondary | ICD-10-CM

## 2016-07-27 DIAGNOSIS — M00032 Staphylococcal arthritis, left wrist: Secondary | ICD-10-CM | POA: Diagnosis not present

## 2016-07-27 DIAGNOSIS — Z955 Presence of coronary angioplasty implant and graft: Secondary | ICD-10-CM

## 2016-07-27 DIAGNOSIS — Z885 Allergy status to narcotic agent status: Secondary | ICD-10-CM | POA: Diagnosis not present

## 2016-07-27 DIAGNOSIS — E1122 Type 2 diabetes mellitus with diabetic chronic kidney disease: Secondary | ICD-10-CM | POA: Diagnosis present

## 2016-07-27 DIAGNOSIS — Z95828 Presence of other vascular implants and grafts: Secondary | ICD-10-CM | POA: Diagnosis not present

## 2016-07-27 DIAGNOSIS — R0789 Other chest pain: Secondary | ICD-10-CM | POA: Diagnosis not present

## 2016-07-27 DIAGNOSIS — R7881 Bacteremia: Secondary | ICD-10-CM | POA: Diagnosis not present

## 2016-07-27 DIAGNOSIS — Z888 Allergy status to other drugs, medicaments and biological substances status: Secondary | ICD-10-CM | POA: Diagnosis not present

## 2016-07-27 DIAGNOSIS — I350 Nonrheumatic aortic (valve) stenosis: Secondary | ICD-10-CM | POA: Diagnosis not present

## 2016-07-27 DIAGNOSIS — I5032 Chronic diastolic (congestive) heart failure: Secondary | ICD-10-CM | POA: Diagnosis present

## 2016-07-27 DIAGNOSIS — I25110 Atherosclerotic heart disease of native coronary artery with unstable angina pectoris: Secondary | ICD-10-CM | POA: Diagnosis not present

## 2016-07-27 DIAGNOSIS — T8172XA Complication of vein following a procedure, not elsewhere classified, initial encounter: Secondary | ICD-10-CM | POA: Diagnosis not present

## 2016-07-27 DIAGNOSIS — Z8701 Personal history of pneumonia (recurrent): Secondary | ICD-10-CM

## 2016-07-27 DIAGNOSIS — I209 Angina pectoris, unspecified: Secondary | ICD-10-CM | POA: Diagnosis not present

## 2016-07-27 DIAGNOSIS — R079 Chest pain, unspecified: Secondary | ICD-10-CM | POA: Diagnosis not present

## 2016-07-27 DIAGNOSIS — Z7984 Long term (current) use of oral hypoglycemic drugs: Secondary | ICD-10-CM | POA: Diagnosis not present

## 2016-07-27 DIAGNOSIS — Z825 Family history of asthma and other chronic lower respiratory diseases: Secondary | ICD-10-CM

## 2016-07-27 DIAGNOSIS — R197 Diarrhea, unspecified: Secondary | ICD-10-CM | POA: Diagnosis not present

## 2016-07-27 DIAGNOSIS — Z79899 Other long term (current) drug therapy: Secondary | ICD-10-CM

## 2016-07-27 DIAGNOSIS — S61502A Unspecified open wound of left wrist, initial encounter: Secondary | ICD-10-CM | POA: Diagnosis not present

## 2016-07-27 DIAGNOSIS — N179 Acute kidney failure, unspecified: Secondary | ICD-10-CM | POA: Diagnosis not present

## 2016-07-27 DIAGNOSIS — M25532 Pain in left wrist: Secondary | ICD-10-CM | POA: Diagnosis not present

## 2016-07-27 DIAGNOSIS — E1022 Type 1 diabetes mellitus with diabetic chronic kidney disease: Secondary | ICD-10-CM | POA: Diagnosis not present

## 2016-07-27 DIAGNOSIS — I708 Atherosclerosis of other arteries: Secondary | ICD-10-CM | POA: Diagnosis present

## 2016-07-27 DIAGNOSIS — I739 Peripheral vascular disease, unspecified: Secondary | ICD-10-CM

## 2016-07-27 DIAGNOSIS — Z452 Encounter for adjustment and management of vascular access device: Secondary | ICD-10-CM | POA: Diagnosis not present

## 2016-07-27 DIAGNOSIS — R112 Nausea with vomiting, unspecified: Secondary | ICD-10-CM | POA: Diagnosis not present

## 2016-07-27 HISTORY — DX: Unspecified staphylococcus as the cause of diseases classified elsewhere: B95.8

## 2016-07-27 LAB — COMPREHENSIVE METABOLIC PANEL
ALT: 22 U/L (ref 17–63)
AST: 60 U/L — ABNORMAL HIGH (ref 15–41)
Albumin: 3.5 g/dL (ref 3.5–5.0)
Alkaline Phosphatase: 33 U/L — ABNORMAL LOW (ref 38–126)
Anion gap: 12 (ref 5–15)
BUN: 26 mg/dL — ABNORMAL HIGH (ref 6–20)
CHLORIDE: 105 mmol/L (ref 101–111)
CO2: 19 mmol/L — AB (ref 22–32)
CREATININE: 1.74 mg/dL — AB (ref 0.61–1.24)
Calcium: 8.3 mg/dL — ABNORMAL LOW (ref 8.9–10.3)
GFR calc Af Amer: 43 mL/min — ABNORMAL LOW (ref >60)
GFR, EST NON AFRICAN AMERICAN: 37 mL/min — AB (ref >60)
Glucose, Bld: 449 mg/dL — ABNORMAL HIGH (ref 65–99)
Potassium: 5.2 mmol/L — ABNORMAL HIGH (ref 3.5–5.1)
SODIUM: 136 mmol/L (ref 135–145)
Total Bilirubin: 1.4 mg/dL — ABNORMAL HIGH (ref 0.3–1.2)
Total Protein: 5.9 g/dL — ABNORMAL LOW (ref 6.5–8.1)

## 2016-07-27 LAB — CBC
HEMATOCRIT: 22.7 % — AB (ref 39.0–52.0)
HEMOGLOBIN: 8.4 g/dL — AB (ref 13.0–17.0)
MCH: 30.8 pg (ref 26.0–34.0)
MCHC: 37 g/dL — AB (ref 30.0–36.0)
MCV: 83.2 fL (ref 78.0–100.0)
Platelets: 121 10*3/uL — ABNORMAL LOW (ref 150–400)
RBC: 2.73 MIL/uL — ABNORMAL LOW (ref 4.22–5.81)
RDW: 15.8 % — ABNORMAL HIGH (ref 11.5–15.5)
WBC: 2.8 10*3/uL — ABNORMAL LOW (ref 4.0–10.5)

## 2016-07-27 LAB — I-STAT TROPONIN, ED
Troponin i, poc: 0.03 ng/mL (ref 0.00–0.08)
Troponin i, poc: 0.09 ng/mL (ref 0.00–0.08)

## 2016-07-27 LAB — GLUCOSE, CAPILLARY
GLUCOSE-CAPILLARY: 170 mg/dL — AB (ref 65–99)
GLUCOSE-CAPILLARY: 206 mg/dL — AB (ref 65–99)
GLUCOSE-CAPILLARY: 308 mg/dL — AB (ref 65–99)
Glucose-Capillary: 313 mg/dL — ABNORMAL HIGH (ref 65–99)
Glucose-Capillary: 255 mg/dL — ABNORMAL HIGH (ref 65–99)

## 2016-07-27 LAB — TYPE AND SCREEN
ABO/RH(D): A POS
Antibody Screen: NEGATIVE

## 2016-07-27 LAB — HEMOGLOBIN AND HEMATOCRIT, BLOOD
HEMATOCRIT: 32.3 % — AB (ref 39.0–52.0)
Hemoglobin: 11.8 g/dL — ABNORMAL LOW (ref 13.0–17.0)

## 2016-07-27 LAB — TSH: TSH: 4.452 u[IU]/mL (ref 0.350–4.500)

## 2016-07-27 LAB — IRON AND TIBC
Iron: 62 ug/dL (ref 45–182)
Saturation Ratios: 17 % — ABNORMAL LOW (ref 17.9–39.5)
TIBC: 371 ug/dL (ref 250–450)
UIBC: 309 ug/dL

## 2016-07-27 LAB — TROPONIN I
Troponin I: 0.06 ng/mL (ref <0.03)
Troponin I: 0.29 ng/mL (ref <0.03)
Troponin I: 1.63 ng/mL (ref <0.03)

## 2016-07-27 LAB — ABO/RH: ABO/RH(D): A POS

## 2016-07-27 LAB — FERRITIN: Ferritin: 456 ng/mL — ABNORMAL HIGH (ref 24–336)

## 2016-07-27 LAB — APTT: aPTT: 33 seconds (ref 24–36)

## 2016-07-27 LAB — HEPARIN LEVEL (UNFRACTIONATED): Heparin Unfractionated: 0.52 IU/mL (ref 0.30–0.70)

## 2016-07-27 LAB — BRAIN NATRIURETIC PEPTIDE: B NATRIURETIC PEPTIDE 5: 64.5 pg/mL (ref 0.0–100.0)

## 2016-07-27 LAB — OCCULT BLOOD X 1 CARD TO LAB, STOOL: FECAL OCCULT BLD: NEGATIVE

## 2016-07-27 LAB — LIPASE, BLOOD: Lipase: 43 U/L (ref 11–51)

## 2016-07-27 LAB — POTASSIUM: POTASSIUM: 4.3 mmol/L (ref 3.5–5.1)

## 2016-07-27 MED ORDER — INSULIN GLARGINE 100 UNIT/ML ~~LOC~~ SOLN
50.0000 [IU] | Freq: Every day | SUBCUTANEOUS | Status: DC
  Administered 2016-07-28 – 2016-07-30 (×3): 50 [IU] via SUBCUTANEOUS
  Filled 2016-07-27 (×5): qty 0.5

## 2016-07-27 MED ORDER — AMIODARONE HCL 200 MG PO TABS
200.0000 mg | ORAL_TABLET | Freq: Every day | ORAL | Status: DC
  Administered 2016-07-27 – 2016-08-04 (×9): 200 mg via ORAL
  Filled 2016-07-27 (×9): qty 1

## 2016-07-27 MED ORDER — HYDROCODONE-ACETAMINOPHEN 5-325 MG PO TABS
1.0000 | ORAL_TABLET | Freq: Two times a day (BID) | ORAL | Status: DC | PRN
  Administered 2016-07-29 – 2016-08-01 (×2): 1 via ORAL
  Filled 2016-07-27 (×3): qty 1

## 2016-07-27 MED ORDER — GI COCKTAIL ~~LOC~~
30.0000 mL | Freq: Once | ORAL | Status: AC
  Administered 2016-07-27: 30 mL via ORAL
  Filled 2016-07-27: qty 30

## 2016-07-27 MED ORDER — NITROGLYCERIN 0.4 MG SL SUBL: 0.4000 mg | SUBLINGUAL_TABLET | SUBLINGUAL | Status: DC | PRN

## 2016-07-27 MED ORDER — LEVOTHYROXINE SODIUM 75 MCG PO TABS
150.0000 ug | ORAL_TABLET | Freq: Every day | ORAL | Status: DC
  Administered 2016-07-27 – 2016-08-04 (×9): 150 ug via ORAL
  Filled 2016-07-27: qty 6
  Filled 2016-07-27: qty 2
  Filled 2016-07-27 (×2): qty 1
  Filled 2016-07-27: qty 2
  Filled 2016-07-27: qty 3
  Filled 2016-07-27 (×2): qty 2
  Filled 2016-07-27: qty 1
  Filled 2016-07-27 (×3): qty 2

## 2016-07-27 MED ORDER — HYDRALAZINE HCL 20 MG/ML IJ SOLN
10.0000 mg | Freq: Four times a day (QID) | INTRAMUSCULAR | Status: DC | PRN
  Administered 2016-07-27 – 2016-08-01 (×3): 10 mg via INTRAVENOUS
  Filled 2016-07-27 (×3): qty 1

## 2016-07-27 MED ORDER — INSULIN ASPART 100 UNIT/ML ~~LOC~~ SOLN
0.0000 [IU] | SUBCUTANEOUS | Status: DC
  Administered 2016-07-27: 5 [IU] via SUBCUTANEOUS
  Administered 2016-07-27: 8 [IU] via SUBCUTANEOUS
  Administered 2016-07-27 (×2): 11 [IU] via SUBCUTANEOUS
  Administered 2016-07-28: 5 [IU] via SUBCUTANEOUS
  Administered 2016-07-28: 3 [IU] via SUBCUTANEOUS
  Administered 2016-07-28 (×2): 5 [IU] via SUBCUTANEOUS

## 2016-07-27 MED ORDER — DILTIAZEM HCL ER COATED BEADS 180 MG PO CP24
180.0000 mg | ORAL_CAPSULE | Freq: Every day | ORAL | Status: DC
Start: 2016-07-27 — End: 2016-08-04
  Administered 2016-07-27 – 2016-08-04 (×9): 180 mg via ORAL
  Filled 2016-07-27 (×10): qty 1

## 2016-07-27 MED ORDER — ATORVASTATIN CALCIUM 40 MG PO TABS
40.0000 mg | ORAL_TABLET | Freq: Every day | ORAL | Status: DC
  Administered 2016-07-27 – 2016-08-04 (×9): 40 mg via ORAL
  Filled 2016-07-27 (×9): qty 1

## 2016-07-27 MED ORDER — FUROSEMIDE 20 MG PO TABS: 40.0000 mg | ORAL_TABLET | Freq: Every day | ORAL | Status: DC

## 2016-07-27 MED ORDER — ACETAMINOPHEN 325 MG PO TABS
650.0000 mg | ORAL_TABLET | ORAL | Status: DC | PRN
  Administered 2016-07-29 – 2016-07-30 (×3): 650 mg via ORAL
  Filled 2016-07-27 (×3): qty 2

## 2016-07-27 MED ORDER — ASPIRIN 81 MG PO CHEW
324.0000 mg | CHEWABLE_TABLET | Freq: Once | ORAL | Status: AC
  Administered 2016-07-27: 324 mg via ORAL
  Filled 2016-07-27: qty 4

## 2016-07-27 MED ORDER — MECLIZINE HCL 12.5 MG PO TABS
12.5000 mg | ORAL_TABLET | Freq: Two times a day (BID) | ORAL | Status: DC | PRN
  Filled 2016-07-27: qty 1

## 2016-07-27 MED ORDER — SODIUM CHLORIDE 0.9 % IV BOLUS (SEPSIS)
1000.0000 mL | Freq: Once | INTRAVENOUS | Status: AC
  Administered 2016-07-27: 1000 mL via INTRAVENOUS

## 2016-07-27 MED ORDER — HEPARIN (PORCINE) IN NACL 100-0.45 UNIT/ML-% IJ SOLN
2200.0000 [IU]/h | INTRAMUSCULAR | Status: DC
  Administered 2016-07-27: 1500 [IU]/h via INTRAVENOUS
  Administered 2016-07-29: 2000 [IU]/h via INTRAVENOUS
  Filled 2016-07-27 (×3): qty 250

## 2016-07-27 MED ORDER — ALLOPURINOL 100 MG PO TABS
200.0000 mg | ORAL_TABLET | Freq: Every day | ORAL | Status: DC
  Administered 2016-07-27 – 2016-08-04 (×9): 200 mg via ORAL
  Filled 2016-07-27 (×10): qty 2

## 2016-07-27 MED ORDER — METOPROLOL TARTRATE 25 MG PO TABS
50.0000 mg | ORAL_TABLET | Freq: Two times a day (BID) | ORAL | Status: DC
  Administered 2016-07-27 – 2016-07-29 (×5): 50 mg via ORAL
  Filled 2016-07-27 (×3): qty 1
  Filled 2016-07-27: qty 2
  Filled 2016-07-27: qty 1

## 2016-07-27 MED ORDER — ONDANSETRON HCL 4 MG/2ML IJ SOLN
4.0000 mg | Freq: Once | INTRAMUSCULAR | Status: AC
  Administered 2016-07-27: 4 mg via INTRAVENOUS
  Filled 2016-07-27: qty 2

## 2016-07-27 MED ORDER — PANTOPRAZOLE SODIUM 40 MG IV SOLR
40.0000 mg | Freq: Two times a day (BID) | INTRAVENOUS | Status: DC
  Administered 2016-07-27 – 2016-07-28 (×3): 40 mg via INTRAVENOUS
  Filled 2016-07-27 (×3): qty 40

## 2016-07-27 MED ORDER — ONDANSETRON HCL 4 MG/2ML IJ SOLN
4.0000 mg | Freq: Four times a day (QID) | INTRAMUSCULAR | Status: DC | PRN
  Administered 2016-07-29 – 2016-08-03 (×5): 4 mg via INTRAVENOUS
  Filled 2016-07-27 (×5): qty 2

## 2016-07-27 MED ORDER — FENOFIBRATE 160 MG PO TABS
160.0000 mg | ORAL_TABLET | Freq: Every day | ORAL | Status: DC
  Administered 2016-07-27 – 2016-08-04 (×9): 160 mg via ORAL
  Filled 2016-07-27 (×9): qty 1

## 2016-07-27 NOTE — Progress Notes (Addendum)
Patient admitted after midnight, please see H&P.    Chest pain-- patient describes more of a RUQ pain to me - Pt with hx of CAD - He was treated with ASA 324 mg, GI cocktail, and Zofran in ED with resolution of pain - trend troponin-- trending up-- will consult cardiology (await return call) and start heparin - He is hypertensive-- resume BP meds - Monitor on telemetry for ischemic changes  Acute on chronic anemia; pancytopenia  - WBC is stable at 2,800 with no fever or apparent infection - Platelets stable at 120,000  - Hgb is 8.4 on admission, down from 10-range in October 2017  - Pt denies melena or hematochezia, reports hx of SB AVMs  - Check FOBT, continue Protonix 40 mg IV q12h  -check Fe levels  Acute kidney injury superimposed on CKD stage III  - SCr is 1.74 on admission, up from apparent baseline of 1.4  - Suspect a prerenal azotemia in setting of recent loose stools  - BMP in AM  Atrial fibrillation, paroxysmal - In a sinus rhythm on admission - CHADS-VASc 5 (age, CHF, CAD, HTN, DM)  - He is anticoagulated with Eliquis, held on admission given drop in Hgb  - Continue amiodarone as tolerated   Hypertension  - Hypertensive in ED and treated with Lopressor in setting of chest pain  - Lasix and valsartan held as above given bump in SCr - Continue Diltiazem, Lopressor with PRN hydralazine  Chronic diastolic CHF  - Pt appears roughly euvolemic on admission  - TTE (07/14/16) with EF 36-64%, grade 1 diastolic dysfunction, mod AS, mild LAE, and mild TR  - follow daily wts and strict I/Os, resume Lasix as appropriate   Cholelithiasis  - Pt is tender in RUQ with cholelithiasis noted on [name] without evidence for acute cholecystitis  - HIDA and possible non-emergent surgical consultation   Hypothyroidism - Check TSH given worsening anemia and malaise  - Continue current-dose Synthroid for now   Eulogio Bear

## 2016-07-27 NOTE — ED Notes (Signed)
Patient transported to X-ray 

## 2016-07-27 NOTE — Progress Notes (Signed)
Pt has arrived to the floor alert and oriented in no apparent distress. Pt ambulated to the bed. Heart monitor box 2w08 applied , CCMD notified. We'll continue to monitor.

## 2016-07-27 NOTE — ED Provider Notes (Signed)
[facility] DEPT Provider Note   CSN: [id number] Arrival date & time: 07/27/16  0130  By signing my name below, I, Neta Mends, attest that this documentation has been prepared under the direction and in the presence of Deno Etienne, DO . Electronically Signed: Neta Mends, ED Scribe. 07/27/2016. 1:39 AM.   History   Chief Complaint Chief Complaint  Patient presents with  . Shortness of Breath    The history is provided by the patient. No language interpreter was used.   HPI Comments:  Michael [name] is a 76 y.o. male who presents to the Emergency Department complaining of an episode of chest pain that began 2 hours ago. Pt reports that he laid down to sleep and felt like he had to burp, and then became short of breath. Pt notes that he becomes short of breath when laying down flat. Pt reports associated pain radiating down his left arm. No alleviating factors noted. Pt denies diaphoresis, vomiting.   Past Medical History:  Diagnosis Date  . Allergy   . Arthritis   . AVM (arteriovenous malformation) of colon   . CAD (coronary artery disease)    a. Cath 10/2014 - Mild to moderate diagonal, circumflex and obtuse marginal disease. Innominate artery sternosis.  . Carotid artery disease ([place])    a. Carotid dopple 03/2014 80-03% RICA &  49-17% LICA stenosis b. 9/15  . CKD (chronic kidney disease), stage III   . Clostridium difficile colitis 05/13/2016  . Dysrhythmia    ATRIAL FIBRILATION  . Elevated troponin    a. 09/2014 in setting of AF RVR-->Myoview: EF 49%, no ischemia/infarct->Med Rx.  . GERD (gastroesophageal reflux disease)   . Gout   . H/O transfusion of whole blood   . History of PFTs    PFTs 6/16:  FVC 3.73 (87%), FEV1 2.93 (88%), FEV1/FVC 78%, DLCO 69%  . Hx of adenomatous colonic polyps 07/02/2016  . Hyperlipidemia   . Hypertension   . Hypothyroidism   . Iron deficiency anemia   . PAF (paroxysmal atrial fibrillation) ([place])    a. 09/2014: Converted on D[place];   b. CHA2DS2VASc = 3-->eliquis;  c. 09/2014 Echo: EF 55-60%, mild LVH, mildly dil LA.  [name] Personal history of colonic polyps 2007, 2008   adenoma each time, largest 12 mm in 2007  . Psoriasis   . Type II diabetes mellitus ([place])    TYPE 2    Patient Active Problem List   Diagnosis Date Noted  . Upper GI bleed 07/27/2016  . Hyperkalemia 07/27/2016  . GIB (gastrointestinal bleeding) 07/27/2016  . Chronic diastolic CHF (congestive heart failure) ([place]) 07/27/2016  . Hx of adenomatous colonic polyps 07/02/2016  . Shortness of breath 06/05/2016  . Bleeding external hemorrhoids 05/13/2016  . Pustular rash 03/17/2016  . Hypothyroidism 03/17/2016  . Acute renal failure superimposed on stage 3 chronic kidney disease ([place]) 03/17/2016  . CAP (community acquired pneumonia) 03/17/2016  . Sepsis ([place]) 03/17/2016  . Rash 03/17/2016  . Noncompliance with diet and medication regimen 12/16/2015  . LBP (low back pain) 03/08/2015  . Frequency-urgency syndrome 03/08/2015  . Subclavian artery stenosis, right ([place]) 01/16/2015  . Cholelithiasis 01/16/2015  . Restless leg syndrome 01/10/2015  . Postherpetic neuralgia 12/18/2014  . Herpes zoster 11/28/2014  . Leg pain, left 11/28/2014  . Demand ischemia ([place])   . PAF (paroxysmal atrial fibrillation) ([place])   . Hyperlipidemia   . Essential hypertension   . Elevated troponin   . Poorly controlled diabetes mellitus (  [place]) 10/11/2014  . Pain in joint, shoulder region 07/10/2014  . Psoriasis 03/19/2014  . Gangrenous appendicitis 02/10/2014  . RLQ abdominal pain 02/09/2014  . Diarrhea 02/09/2014  . Appendicitis 02/09/2014  . Unspecified vitamin D deficiency 11/13/2013  . Carotid stenosis 11/06/2013  . Headache(784.0) 11/06/2013  . Psoriatic arthritis ([place]) 11/06/2013  . Hypertriglyceridemia 05/16/2013  . Chest pain 09/10/2012  . HTN (hypertension), benign 09/10/2012  . Morbid obesity due to excess calories ([place]) 09/10/2012  . Gout 09/10/2012  .  Angiodysplasia of intestinal tract 01/20/2012  . Iron deficiency anemia due to chronic blood loss 01/20/2012  . Pancytopenia ([place]) 12/22/2011  . DM (diabetes mellitus), type 2 with peripheral vascular complications ([place]) 00/93/8182  . Thyroid disease 12/22/2011    Past Surgical History:  Procedure Laterality Date  . APPENDECTOMY  02/09/2014  . COLONOSCOPY  02/10/2011   internal hemorrhoids  . COLONOSCOPY W/ POLYPECTOMY  04/09/2006   12 mm adenoma  . COLONOSCOPY W/ POLYPECTOMY  06/17/2007   5 mm adenoma  . COLONOSCOPY WITH PROPOFOL N/A 05/13/2016   Procedure: COLONOSCOPY WITH PROPOFOL;  Surgeon: Manus Gunning, MD;  Location: [facility] ENDOSCOPY;  Service: Gastroenterology;  Laterality: N/A;  . ESOPHAGOGASTRODUODENOSCOPY  12/23/2011   Procedure: ESOPHAGOGASTRODUODENOSCOPY (EGD);  Surgeon: Irene Shipper, MD;  Location: [facility];  Service: Endoscopy;  Laterality: N/A;  with small bowel bx's  . GIVENS CAPSULE STUDY  12/28/2011  . KNEE ARTHROSCOPY     right  . LAPAROSCOPIC APPENDECTOMY N/A 02/09/2014   Procedure: APPENDECTOMY LAPAROSCOPIC;  Surgeon: Leighton Ruff, MD;  Location: [facility] ORS;  Service: General;  Laterality: N/A;  . LEFT HEART CATHETERIZATION WITH CORONARY ANGIOGRAM N/A 11/15/2014   Procedure: LEFT HEART CATHETERIZATION WITH CORONARY ANGIOGRAM;  Surgeon: Belva Crome, MD;  Location: [facility] CATH LAB;  Service: Cardiovascular;  Laterality: N/A;  . ROTATOR CUFF REPAIR     left       Home Medications    Prior to Admission medications   Medication Sig Start Date End Date Taking? Authorizing Provider  acetaminophen (TYLENOL) 650 MG CR tablet Take 1,300 mg by mouth every 8 (eight) hours as needed for pain.    Yes Historical Provider, MD  allopurinol (ZYLOPRIM) 100 MG tablet Take 2 tablets (200 mg total) by mouth daily. 02/03/16  Yes Evie Lacks [name], MD  amiodarone (PACERONE) 200 MG tablet TAKE 1 TABLET BY MOUTH DAILY 06/24/16  Yes Wellington Hampshire, MD  apixaban (ELIQUIS) 5 MG TABS  tablet Take 1 tablet (5 mg total) by mouth 2 (two) times daily. 06/02/16  Yes Wellington Hampshire, MD  atorvastatin (LIPITOR) 40 MG tablet Take 1 tablet (40 mg total) by mouth daily. 03/09/16  Yes Josue Hector, MD  Cholecalciferol 1000 UNITS TBDP Take 1,000-5,000 Units by mouth daily. Takes 1000 units everyday except for on Saturday patient takes 5000 units   Yes Historical Provider, MD  diltiazem (CARDIZEM CD) 180 MG 24 hr capsule Take 1 capsule (180 mg total) by mouth daily. 12/16/15  Yes Wellington Hampshire, MD  fenofibrate 160 MG tablet TAKE 1 TABLET(160 MG) BY MOUTH DAILY 08/09/15  Yes Darlin Coco, MD  ferrous sulfate 325 (65 FE) MG tablet Take 325 mg by mouth 3 (three) times daily with meals.   Yes Historical Provider, MD  furosemide (LASIX) 40 MG tablet Take 1 tablet (40 mg total) by mouth daily. 07/22/16  Yes Wellington Hampshire, MD  glipiZIDE (GLUCOTROL) 10 MG tablet Take 10 mg by mouth daily. 05/06/16  Yes Historical  Provider, MD  HUMIRA PEN 40 MG/0.8ML PNKT Inject 40 mg into the skin every 14 (fourteen) days. 06/18/15  Yes Historical Provider, MD  HYDROcodone-acetaminophen (NORCO/VICODIN) 5-325 MG tablet Take 1 tablet by mouth 2 (two) times daily as needed for moderate pain or severe pain. 12/16/15  Yes Evie Lacks [name], MD  Insulin Glargine (TOUJEO SOLOSTAR) 300 UNIT/ML SOPN Inject 50 Units into the skin daily. Titrate up by 1 unit every 1-2 days for goal sugars of 120-150 Patient taking differently: Inject 52 Units into the skin daily.  04/17/16 04/17/20 Yes Evie Lacks [name], MD  levothyroxine (SYNTHROID, LEVOTHROID) 150 MCG tablet TAKE 1 TABLET BY MOUTH DAILY BEFORE BREAKFAST 03/16/16  Yes Cassandria Anger, MD  meclizine (ANTIVERT) 12.5 MG tablet Take 1 tablet (12.5 mg total) by mouth 2 (two) times daily as needed for dizziness (do not use for more than 3days). 07/07/16  Yes Charlene Brooke Nche, NP  metoprolol (LOPRESSOR) 50 MG tablet TAKE 1 TABLET(50 MG) BY MOUTH TWICE DAILY 06/25/16  Yes  Wellington Hampshire, MD  omeprazole (PRILOSEC) 40 MG capsule TAKE 1 CAPSULE BY MOUTH EVERY DAY 05/04/16  Yes Evie Lacks [name], MD  valsartan (DIOVAN) 160 MG tablet Take 1 tablet (160 mg total) by mouth daily. 06/05/16  Yes Tanda Rockers, MD  Blood Glucose Monitoring Suppl (ONE TOUCH ULTRA 2) W/DEVICE KIT Use as directed Dx E11.9 05/18/14   Evie Lacks [name], MD  Blood Pressure Monitor KIT Use to check blood pressure daily Dx I10 10/04/15   Evie Lacks [name], MD  glucose blood (ONE TOUCH ULTRA TEST) test strip 1 each by Other route 4 (four) times daily. Use to check blood sugar four times a day  Dx: E11.9- insulin Dependant 04/06/16   Evie Lacks [name], MD  Insulin Pen Needle 31G X 5 MM MISC 1 Units by Does not apply route 4 (four) times daily. 03/17/16   Evie Lacks [name], MD  Lancets Glory Rosebush ULTRASOFT) lancets Use to check blood sugars daily 05/18/14   Cassandria Anger, MD    Family History Family History  Problem Relation Age of Onset  . Heart attack Father   . Lung cancer Father   . Diabetes Father   . Stroke Father   . Hypertension Father   . Stroke Mother   . Hypertension Mother   . Cancer Brother     liver  . Heart disease Brother     chf  . COPD Sister   . Malignant hyperthermia Neg Hx     Social History Social History  Substance Use Topics  . Smoking status: Former Smoker    Packs/day: 0.70    Years: 48.00    Types: Cigarettes    Quit date: 03/29/2006  . Smokeless tobacco: Never Used  . Alcohol use 0.6 oz/week    1 Cans of beer per week     Comment: occasional alcohol intake     Allergies   Percocet [oxycodone-acetaminophen]; Invokana [canagliflozin]; and Metformin and related   Review of Systems Review of Systems  Constitutional: Negative for chills, diaphoresis and fever.  HENT: Negative for congestion and facial swelling.   Eyes: Negative for discharge and visual disturbance.  Respiratory: Positive for shortness of breath.   Cardiovascular:  Positive for chest pain. Negative for palpitations.  Gastrointestinal: Negative for abdominal pain, diarrhea and vomiting.  Musculoskeletal: Negative for arthralgias and myalgias.  Skin: Negative for color change and rash.  Neurological: Negative for tremors, syncope and headaches.  Psychiatric/Behavioral: Negative for confusion  and dysphoric mood.  All other systems reviewed and are negative.    Physical Exam Updated Vital Signs BP (!) 154/51   Pulse 69   Temp 97.9 F (36.6 C)   Resp 14   SpO2 96%   Physical Exam  Constitutional: He is oriented to person, place, and time. He appears well-developed and well-nourished.  HENT:  Head: Normocephalic and atraumatic.  Eyes: EOM are normal. Pupils are equal, round, and reactive to light.  Neck: Normal range of motion. Neck supple. No JVD present.  Cardiovascular: Normal rate and regular rhythm.  Exam reveals no gallop and no friction rub.   No murmur heard. Pulmonary/Chest: No respiratory distress. He has no wheezes.  5/6 systolic ejection posterior to pulmonic   Abdominal: He exhibits no distension. There is no rebound and no guarding.  Tenderness to epigastrium  Musculoskeletal: Normal range of motion.  Neurological: He is alert and oriented to person, place, and time.  Skin: No rash noted. No pallor.  Psychiatric: He has a normal mood and affect. His behavior is normal.  Nursing note and vitals reviewed.    ED Treatments / Results   DIAGNOSTIC STUDIES:  Oxygen Saturation is 100% on RA, normal by my interpretation.    COORDINATION OF CARE:  1:39 AM Discussed treatment plan with pt at bedside and pt agreed to plan.   Labs (all labs ordered are listed, but only abnormal results are displayed) Labs Reviewed  CBC - Abnormal; Notable for the following:       Result Value   WBC 2.8 (*)    RBC 2.73 (*)    Hemoglobin 8.4 (*)    HCT 22.7 (*)    MCHC 37.0 (*)    RDW 15.8 (*)    Platelets 121 (*)    All other components  within normal limits  COMPREHENSIVE METABOLIC PANEL - Abnormal; Notable for the following:    Potassium 5.2 (*)    CO2 19 (*)    Glucose, Bld 449 (*)    BUN 26 (*)    Creatinine, Ser 1.74 (*)    Calcium 8.3 (*)    Total Protein 5.9 (*)    AST 60 (*)    Alkaline Phosphatase 33 (*)    Total Bilirubin 1.4 (*)    GFR calc non Af Amer 37 (*)    GFR calc Af Amer 43 (*)    All other components within normal limits  I-STAT TROPOININ, ED - Abnormal; Notable for the following:    Troponin i, poc 0.09 (*)    All other components within normal limits  LIPASE, BLOOD  HEMOGLOBIN A1C  TROPONIN I  TROPONIN I  TROPONIN I  HEMOGLOBIN AND HEMATOCRIT, BLOOD  HEMOGLOBIN AND HEMATOCRIT, BLOOD  POTASSIUM  BRAIN NATRIURETIC PEPTIDE  TSH  I-STAT TROPOININ, ED  POC OCCULT BLOOD, ED  I-STAT TROPOININ, ED  TYPE AND SCREEN  ABO/RH  TYPE AND SCREEN    EKG  EKG Interpretation  Date/Time:  Monday July 27 2016 01:41:14 EST Ventricular Rate:  70 PR Interval:    QRS Duration: 112 QT Interval:  445 QTC Calculation: 481 R Axis:   50 Text Interpretation:  Sinus rhythm Incomplete left bundle branch block Borderline prolonged QT interval No old tracing to compare Confirmed by Sanchez Hemmer MD, DANIEL [phone]) on 07/27/2016 1:54:23 AM       EKG Interpretation  Date/Time:  Monday July 27 2016 18:29:93 EST Ventricular Rate:  68 PR Interval:    QRS Duration: 116 QT  Interval:  449 QTC Calculation: 478 R Axis:   53 Text Interpretation:  Sinus rhythm Nonspecific intraventricular conduction delay Nonspecific T abnormalities, lateral leads No significant change since last tracing Confirmed by Miria Cappelli MD, DANIEL [phone]) on 07/27/2016 3:53:59 AM        Radiology Dg Chest 2 View  Result Date: 07/27/2016 CLINICAL DATA:  Generalized chest pressure today.  Recent pneumonia. EXAM: CHEST  2 VIEW COMPARISON:  Chest radiograph July 07, 2016 FINDINGS: Cardiomediastinal silhouette is normal. Mildly calcified aortic  knob. Patchy RIGHT middle lobe airspace opacity. Linear densities project LEFT mid and lower lung zone. No pleural effusion. No pneumothorax. Soft tissue planes included osseous structures are nonsuspicious. IMPRESSION: Similar patchy RIGHT middle lobe and lingular/LEFT lung base atelectasis/scarring. Electronically Signed   By: Elon Alas M.D.   On: 07/27/2016 02:21   US Abdomen Limited Ruq  Result Date: 07/27/2016 CLINICAL DATA:  Right upper quadrant pain and chest pain, 4 hours duration. EXAM: US ABDOMEN LIMITED - RIGHT UPPER QUADRANT COMPARISON:  None. FINDINGS: Gallbladder: Multiple mobile calculi, measuring up to 1.2 cm. No gallbladder wall thickening or pericholecystic fluid. The patient was not tender over the gallbladder. Common bile duct: Diameter: 3 mm Liver: Mild coarsening of hepatic echotexture without discrete lesion. IMPRESSION: Cholelithiasis without sonographic evidence of acute cholecystitis. Electronically Signed   By: Andreas Newport M.D.   On: 07/27/2016 03:55    Procedures Procedures (including critical care time)  Medications Ordered in ED Medications  pantoprazole (PROTONIX) injection 40 mg (not administered)  meclizine (ANTIVERT) tablet 12.5 mg (not administered)  metoprolol tartrate (LOPRESSOR) tablet 50 mg (not administered)  amiodarone (PACERONE) tablet 200 mg (not administered)  levothyroxine (SYNTHROID, LEVOTHROID) tablet 150 mcg (not administered)  atorvastatin (LIPITOR) tablet 40 mg (not administered)  allopurinol (ZYLOPRIM) tablet 200 mg (not administered)  diltiazem (CARDIZEM CD) 24 hr capsule 180 mg (not administered)  HYDROcodone-acetaminophen (NORCO/VICODIN) 5-325 MG per tablet 1 tablet (not administered)  fenofibrate tablet 160 mg (not administered)  nitroGLYCERIN (NITROSTAT) SL tablet 0.4 mg (not administered)  acetaminophen (TYLENOL) tablet 650 mg (not administered)  ondansetron (ZOFRAN) injection 4 mg (not administered)  insulin glargine  (LANTUS) injection 50 Units (not administered)  insulin aspart (novoLOG) injection 0-15 Units (not administered)  aspirin chewable tablet 324 mg (324 mg Oral Given 07/27/16 0152)  gi cocktail (Maalox,Lidocaine,Donnatal) (30 mLs Oral Given 07/27/16 0152)  sodium chloride 0.9 % bolus 1,000 mL (0 mLs Intravenous Stopped 07/27/16 0409)  ondansetron (ZOFRAN) injection 4 mg (4 mg Intravenous Given 07/27/16 0311)     Initial Impression / Assessment and Plan / ED Course  I have reviewed the triage vital signs and the nursing notes.  Pertinent labs & imaging results that were available during my care of the patient were reviewed by me and considered in my medical decision making (see chart for details).  Clinical Course     76 yo M With a chief complaint of chest pain shortness of breath. This occurred this evening when he was lying back flat. Improved when he sat up. Did not try anything else for this. Has a history of off and on rectal bleeding and has been seen by Dr. [name]. He had a recent colonoscopy on November 1 per the family was unremarkable. He has been having multiple bowel movements a day. Denies dark stool or blood in the stool. Is mostly when he wipes with patient. Labs here show a 2 and half gram hemoglobin drop. Patient has been having symptoms of weakness in  fatigue. Feel that this is likely symptomatic anemia. With epigastric pain concern for upper GI bleed. Start on Protonix. Right upper quadrant ultrasound performed due to LFT elevation showing cholelithiasis without cholecystitis.  The patients results and plan were reviewed and discussed.   Any x-rays performed were independently reviewed by myself.   Differential diagnosis were considered with the presenting HPI.  Medications  pantoprazole (PROTONIX) injection 40 mg (not administered)  meclizine (ANTIVERT) tablet 12.5 mg (not administered)  metoprolol tartrate (LOPRESSOR) tablet 50 mg (not administered)  amiodarone (PACERONE)  tablet 200 mg (not administered)  levothyroxine (SYNTHROID, LEVOTHROID) tablet 150 mcg (not administered)  atorvastatin (LIPITOR) tablet 40 mg (not administered)  allopurinol (ZYLOPRIM) tablet 200 mg (not administered)  diltiazem (CARDIZEM CD) 24 hr capsule 180 mg (not administered)  HYDROcodone-acetaminophen (NORCO/VICODIN) 5-325 MG per tablet 1 tablet (not administered)  fenofibrate tablet 160 mg (not administered)  nitroGLYCERIN (NITROSTAT) SL tablet 0.4 mg (not administered)  acetaminophen (TYLENOL) tablet 650 mg (not administered)  ondansetron (ZOFRAN) injection 4 mg (not administered)  insulin glargine (LANTUS) injection 50 Units (not administered)  insulin aspart (novoLOG) injection 0-15 Units (not administered)  aspirin chewable tablet 324 mg (324 mg Oral Given 07/27/16 0152)  gi cocktail (Maalox,Lidocaine,Donnatal) (30 mLs Oral Given 07/27/16 0152)  sodium chloride 0.9 % bolus 1,000 mL (0 mLs Intravenous Stopped 07/27/16 0409)  ondansetron (ZOFRAN) injection 4 mg (4 mg Intravenous Given 07/27/16 0311)    Vitals:   07/27/16 0404 07/27/16 0415 07/27/16 0500 07/27/16 0545  BP:  158/72 (!) 149/54 (!) 154/51  Pulse: 70 71 72 69  Resp: 16 18 10 14   Temp:      SpO2: 97% 95% 96% 96%    Final diagnoses:  RUQ abdominal pain  Symptomatic anemia  Upper GI bleed    Admission/ observation were discussed with the admitting physician, patient and/or family and they are comfortable with the plan.    Final Clinical Impressions(s) / ED Diagnoses   Final diagnoses:  RUQ abdominal pain  Symptomatic anemia  Upper GI bleed    New Prescriptions New Prescriptions   No medications on file  I personally performed the services described in this documentation, which was scribed in my presence. The recorded information has been reviewed and is accurate.      Deno Etienne, DO 07/27/16 [phone]

## 2016-07-27 NOTE — Progress Notes (Signed)
[place] for heparin (apixaban PTA) Indication: chest pain/ACS and atrial fibrillation  Allergies  Allergen Reactions  . Percocet [Oxycodone-Acetaminophen] Nausea And Vomiting  . Invokana [Canagliflozin]     Side effects  . Metformin And Related     Upset stomach    Patient Measurements: Height: 5\' 11"  (180.3 cm) Weight: 262 lb 8 oz (119.1 kg) IBW/kg (Calculated) : 75.3 Heparin Dosing Weight: 101 kg   Vital Signs: Temp: 98 F (36.7 C) (01/01 1406) Temp Source: Oral (01/01 1406) BP: 179/65 (01/01 1406) Pulse Rate: 60 (01/01 1406)  Labs:  Recent Labs  07/27/16 0156 07/27/16 0536 07/27/16 0957  HGB 8.4*  --  11.8*  HCT 22.7*  --  32.3*  PLT 121*  --   --   CREATININE 1.74*  --   --   TROPONINI  --  0.06* 0.29*    Estimated Creatinine Clearance: 49.6 mL/min (by C-G formula based on SCr of 1.74 mg/dL (H)).   Medical History: Past Medical History:  Diagnosis Date  . Allergy   . Arthritis   . AVM (arteriovenous malformation) of colon   . CAD (coronary artery disease)    a. Cath 10/2014 - Mild to moderate diagonal, circumflex and obtuse marginal disease. Innominate artery sternosis.  . Carotid artery disease ([place])    a. Carotid dopple 03/2014 00-34% RICA &  91-79% LICA stenosis b. 1/50  . CKD (chronic kidney disease), stage III   . Clostridium difficile colitis 05/13/2016  . Dysrhythmia    ATRIAL FIBRILATION  . Elevated troponin    a. 09/2014 in setting of AF RVR-->Myoview: EF 49%, no ischemia/infarct->Med Rx.  . GERD (gastroesophageal reflux disease)   . Gout   . H/O transfusion of whole blood   . History of PFTs    PFTs 6/16:  FVC 3.73 (87%), FEV1 2.93 (88%), FEV1/FVC 78%, DLCO 69%  . Hx of adenomatous colonic polyps 07/02/2016  . Hyperlipidemia   . Hypertension   . Hypothyroidism   . Iron deficiency anemia   . PAF (paroxysmal atrial fibrillation) ([place])    a. 09/2014: Converted on Dilt;  b. CHA2DS2VASc = 3-->eliquis;   c. 09/2014 Echo: EF 55-60%, mild LVH, mildly dil LA.  [name] Personal history of colonic polyps 2007, 2008   adenoma each time, largest 12 mm in 2007  . Psoriasis   . Type II diabetes mellitus ([place])    TYPE 2    Assessment: 23 yoM to start on IV heparin for r/o ACS.  Troponins are trending up.  [name] was on apixaban prior to admission for atrial fibrillation.  Apixaban now on hold, with last dose on 12/31 in the evening, and heparin infusion to start.  Heparin levels will be elevated due to apixaban exposure, so aPTTs will be used to monitor anticoagulation effects. Hgb stable with noted chronic pancytopenia.    Goal of Therapy:  Heparin level 0.3-0.7 units/ml aPTT 66-102 seconds Monitor platelets by anticoagulation protocol: Yes    Plan:  1. Start heparin infusion at 1500 units/hr 2. Obtain baseline aPTT and heparin level  3. APTT in 8 hours   Vincenza Hews, PharmD, BCPS 07/27/2016, 4:16 PM Pager: [phone]

## 2016-07-27 NOTE — Progress Notes (Signed)
Lab called with a Troponin value of 1.63 paged doctor Eliseo Squires and in formed her

## 2016-07-27 NOTE — ED Triage Notes (Signed)
Pt presents to the ED by [facility] for SOB. Pt laid down at home and started to feel short of breath. Upon EMS arrival BP was 230/88. Received 2 nitro with some relief and BP is now 141/92. Vital stable

## 2016-07-27 NOTE — H&P (Signed)
History and Physical    Michael [name]:[id number] DOB: Mar 10, 1941 DOA: 07/27/2016  PCP: [name], MD   Patient coming from: Home  Chief Complaint: Chest pain   HPI: Michael [name] is a 76 y.o. male with medical history significant for insulin-dependent diabetes mellitus, hypertension, coronary artery disease, chronic kidney disease stage III, paroxysmal atrial fibrillation on Eliquis, and reported small bowel AVMs who presents to the emergency department with chest pain and dyspnea. Patient reports that he had been in his usual state of health aside from some loose stools, but shortly after lying down to sleep tonight, developed acute onset of pressure sensation in the mid to lower chest with acute dyspnea. There was radiation of the pain down the left arm, but no diaphoresis or nausea. Patient also describes a general malaise and vague sense of unwellness. He did not attempt any interventions prior to coming in for evaluation. He denies any recent fevers or chills and has not had a cough. He denies any significant extremity edema and denies palpitations. He denies any vomiting, melena, or hematochezia.  ED Course: Upon arrival to the ED, patient is found to be afebrile, saturating adequately on room air, and with vitals otherwise stable. EKG featured sinus rhythm with nonspecific interventricular conduction delay. Chest x-ray features some patchy right middle lobe and lingular atelectasis or scarring. Chemistry panel was notable for a potassium of 5.2, BUN of 26, and serum creatinine 1.74, up from an apparent baseline of 1.4. Glucose is elevated to a value of 449. CBC features of pancytopenia with stable leukopenia of 2800, stable thrombocytopenia with platelets 121,000, and a hemoglobin of 8.4, down from the 10 range when last checked in October. Patient was given a 324 mg aspirin in the emergency department, a GI cocktail, Zofran, and 1 L of normal saline. There was concern for a possible GI  bleed and he was started on IV Protonix 40 mg IV. Patient has had no respiratory distress in the ED and has remained hemodynamically stable. There is been no recurrence of his chest pain. He will be observed on the telemetry unit for ongoing evaluation and management of chest pain concerning for possible ACS, as well as acute on chronic kidney disease and acute on chronic anemia concerning for possible GI bleeding.  Review of Systems:  All other systems reviewed and apart from HPI, are negative.  Past Medical History:  Diagnosis Date  . Allergy   . Arthritis   . AVM (arteriovenous malformation) of colon   . CAD (coronary artery disease)    a. Cath 10/2014 - Mild to moderate diagonal, circumflex and obtuse marginal disease. Innominate artery sternosis.  . Carotid artery disease ([place])    a. Carotid dopple 03/2014 61-44% RICA &  31-54% LICA stenosis b. 0/08  . CKD (chronic kidney disease), stage III   . Clostridium difficile colitis 05/13/2016  . Dysrhythmia    ATRIAL FIBRILATION  . Elevated troponin    a. 09/2014 in setting of AF RVR-->Myoview: EF 49%, no ischemia/infarct->Med Rx.  . GERD (gastroesophageal reflux disease)   . Gout   . H/O transfusion of whole blood   . History of PFTs    PFTs 6/16:  FVC 3.73 (87%), FEV1 2.93 (88%), FEV1/FVC 78%, DLCO 69%  . Hx of adenomatous colonic polyps 07/02/2016  . Hyperlipidemia   . Hypertension   . Hypothyroidism   . Iron deficiency anemia   . PAF (paroxysmal atrial fibrillation) ([place])    a. 09/2014: Converted on  Dilt;  b. CHA2DS2VASc = 3-->eliquis;  c. 09/2014 Echo: EF 55-60%, mild LVH, mildly dil LA.  [name] Personal history of colonic polyps 2007, 2008   adenoma each time, largest 12 mm in 2007  . Psoriasis   . Type II diabetes mellitus ([place])    TYPE 2    Past Surgical History:  Procedure Laterality Date  . APPENDECTOMY  02/09/2014  . COLONOSCOPY  02/10/2011   internal hemorrhoids  . COLONOSCOPY W/ POLYPECTOMY  04/09/2006   12 mm adenoma  .  COLONOSCOPY W/ POLYPECTOMY  06/17/2007   5 mm adenoma  . COLONOSCOPY WITH PROPOFOL N/A 05/13/2016   Procedure: COLONOSCOPY WITH PROPOFOL;  Surgeon: Manus Gunning, MD;  Location: [facility] END[facility]COPY;  Service: Gastroenterology;  Laterality: N/A;  . ESOPHAGOGASTRODUODENOSCOPY  12/23/2011   Procedure: ESOPHAGOGASTRODUODENOSCOPY (EGD);  Surgeon: Irene Shipper, MD;  Location: [facility];  Service: Endoscopy;  Laterality: N/A;  with small bowel bx's  . GIVENS CAPSULE STUDY  12/28/2011  . KNEE ARTHROSCOPY     right  . LAPAROSCOPIC APPENDECTOMY N/A 02/09/2014   Procedure: APPENDECTOMY LAPAROSCOPIC;  Surgeon: Leighton Ruff, MD;  Location: [facility] ORS;  Service: General;  Laterality: N/A;  . LEFT HEART CATHETERIZATION WITH CORONARY ANGIOGRAM N/A 11/15/2014   Procedure: LEFT HEART CATHETERIZATION WITH CORONARY ANGIOGRAM;  Surgeon: Belva Crome, MD;  Location: [facility] CATH LAB;  Service: Cardiovascular;  Laterality: N/A;  . ROTATOR CUFF REPAIR     left     reports that he quit smoking about 10 years ago. His smoking use included Cigarettes. He has a 33.60 pack-year smoking history. He has never used smokeless tobacco. He reports that he drinks about 0.6 oz of alcohol per week . He reports that he does not use drugs.  Allergies  Allergen Reactions  . Percocet [Oxycodone-Acetaminophen] Nausea And Vomiting  . Invokana [Canagliflozin]     Side effects  . Metformin And Related     Upset stomach    Family History  Problem Relation Age of Onset  . Heart attack Father   . Lung cancer Father   . Diabetes Father   . Stroke Father   . Hypertension Father   . Stroke Mother   . Hypertension Mother   . Cancer Brother     liver  . Heart disease Brother     chf  . COPD Sister   . Malignant hyperthermia Neg Hx      Prior to Admission medications   Medication Sig Start Date End Date Taking? Authorizing Provider  acetaminophen (TYLENOL) 650 MG CR tablet Take 1,300 mg by mouth every 8 (eight) hours as needed  for pain.    Yes Historical Provider, MD  allopurinol (ZYLOPRIM) 100 MG tablet Take 2 tablets (200 mg total) by mouth daily. 02/03/16  Yes Evie Lacks [name], MD  amiodarone (PACERONE) 200 MG tablet TAKE 1 TABLET BY MOUTH DAILY 06/24/16  Yes Wellington Hampshire, MD  apixaban (ELIQUIS) 5 MG TABS tablet Take 1 tablet (5 mg total) by mouth 2 (two) times daily. 06/02/16  Yes Wellington Hampshire, MD  atorvastatin (LIPITOR) 40 MG tablet Take 1 tablet (40 mg total) by mouth daily. 03/09/16  Yes Josue Hector, MD  Cholecalciferol 1000 UNITS TBDP Take 1,000-5,000 Units by mouth daily. Takes 1000 units everyday except for on Saturday patient takes 5000 units   Yes Historical Provider, MD  diltiazem (CARDIZEM CD) 180 MG 24 hr capsule Take 1 capsule (180 mg total) by mouth daily. 12/16/15  Yes Rogue Jury  A Fletcher Anon, MD  fenofibrate 160 MG tablet TAKE 1 TABLET(160 MG) BY MOUTH DAILY 08/09/15  Yes Darlin Coco, MD  ferrous sulfate 325 (65 FE) MG tablet Take 325 mg by mouth 3 (three) times daily with meals.   Yes Historical Provider, MD  furosemide (LASIX) 40 MG tablet Take 1 tablet (40 mg total) by mouth daily. 07/22/16  Yes Wellington Hampshire, MD  glipiZIDE (GLUCOTROL) 10 MG tablet Take 10 mg by mouth daily. 05/06/16  Yes Historical Provider, MD  HUMIRA PEN 40 MG/0.8ML PNKT Inject 40 mg into the skin every 14 (fourteen) days. 06/18/15  Yes Historical Provider, MD  HYDROcodone-acetaminophen (NORCO/VICODIN) 5-325 MG tablet Take 1 tablet by mouth 2 (two) times daily as needed for moderate pain or severe pain. 12/16/15  Yes Evie Lacks [name], MD  Insulin Glargine (TOUJEO SOLOSTAR) 300 UNIT/ML SOPN Inject 50 Units into the skin daily. Titrate up by 1 unit every 1-2 days for goal sugars of 120-150 Patient taking differently: Inject 52 Units into the skin daily.  04/17/16 04/17/20 Yes Evie Lacks [name], MD  levothyroxine (SYNTHROID, LEVOTHROID) 150 MCG tablet TAKE 1 TABLET BY MOUTH DAILY BEFORE BREAKFAST 03/16/16  Yes Cassandria Anger, MD  meclizine (ANTIVERT) 12.5 MG tablet Take 1 tablet (12.5 mg total) by mouth 2 (two) times daily as needed for dizziness (do not use for more than 3days). 07/07/16  Yes Charlene Brooke Nche, NP  metoprolol (LOPRESSOR) 50 MG tablet TAKE 1 TABLET(50 MG) BY MOUTH TWICE DAILY 06/25/16  Yes Wellington Hampshire, MD  omeprazole (PRILOSEC) 40 MG capsule TAKE 1 CAPSULE BY MOUTH EVERY DAY 05/04/16  Yes Evie Lacks [name], MD  valsartan (DIOVAN) 160 MG tablet Take 1 tablet (160 mg total) by mouth daily. 06/05/16  Yes Tanda Rockers, MD  Blood Glucose Monitoring Suppl (ONE TOUCH ULTRA 2) W/DEVICE KIT Use as directed Dx E11.9 05/18/14   Evie Lacks [name], MD  Blood Pressure Monitor KIT Use to check blood pressure daily Dx I10 10/04/15   Evie Lacks [name], MD  glucose blood (ONE TOUCH ULTRA TEST) test strip 1 each by Other route 4 (four) times daily. Use to check blood sugar four times a day  Dx: E11.9- insulin Dependant 04/06/16   Evie Lacks [name], MD  Insulin Pen Needle 31G X 5 MM MISC 1 Units by Does not apply route 4 (four) times daily. 03/17/16   Cassandria Anger, MD  Lancets [facility] ULTRASOFT) lancets Use to check blood sugars daily 05/18/14   Cassandria Anger, MD    Physical Exam: Vitals:   07/27/16 0245 07/27/16 0300 07/27/16 0402 07/27/16 0404  BP: (!) 152/135 170/61 180/66   Pulse: 66 66  70  Resp: _0 Temp:      SpO2: 97% 97%  97%      Constitutional: NAD, calm, comfortable. Obese Eyes: PERTLA, lids and conjunctivae normal ENMT: Mucous membranes are moist. Posterior pharynx clear of any exudate or lesions.   Neck: normal, supple, no masses, no thyromegaly Respiratory: clear to auscultation bilaterally, no wheezing, no crackles. Normal respiratory effort.  Cardiovascular: S1 & S2 heard, regular rate and rhythm, grade 3 holosystolic murmur at upper sternal border. Trace pretibial edema b/l. No significant JVD. Abdomen: No distension, RUQ tenderness without  rebound pain or guarding, no masses palpated. Bowel sounds normal.  Musculoskeletal: no clubbing / cyanosis. No joint deformity upper and lower extremities. Normal muscle tone.  Skin: no significant rashes, lesions, ulcers. Warm, dry, well-perfused. Neurologic: CN  2-12 grossly intact. Sensation intact, DTR normal. Strength 5/5 in all 4 limbs.  Psychiatric: Normal judgment and insight. Alert and oriented x 3. Normal mood and affect.     Labs on Admission: I have personally reviewed following labs and imaging studies  CBC:  Recent Labs Lab 07/27/16 0156  WBC 2.8*  HGB 8.4*  HCT 22.7*  MCV 83.2  PLT 258*   Basic Metabolic Panel:  Recent Labs Lab 07/27/16 0156  NA 136  K 5.2*  CL 105  CO2 19*  GLUCOSE 449*  BUN 26*  CREATININE 1.74*  CALCIUM 8.3*   GFR: CrCl cannot be calculated (Unknown ideal weight.). Liver Function Tests:  Recent Labs Lab 07/27/16 0156  AST 60*  ALT 22  ALKPHOS 33*  BILITOT 1.4*  PROT 5.9*  ALBUMIN 3.5    Recent Labs Lab 07/27/16 0156  LIPASE 43   No results for input(s): AMMONIA in the last 168 hours. Coagulation Profile: No results for input(s): INR, PROTIME in the last 168 hours. Cardiac Enzymes: No results for input(s): CKTOTAL, CKMB, CKMBINDEX, TROPONINI in the last 168 hours. BNP (last 3 results) No results for input(s): PROBNP in the last 8760 hours. HbA1C: No results for input(s): HGBA1C in the last 72 hours. CBG: No results for input(s): GLUCAP in the last 168 hours. Lipid Profile: No results for input(s): CHOL, HDL, LDLCALC, TRIG, CHOLHDL, LDLDIRECT in the last 72 hours. Thyroid Function Tests: No results for input(s): TSH, T4TOTAL, FREET4, T3FREE, THYROIDAB in the last 72 hours. Anemia Panel: No results for input(s): VITAMINB12, FOLATE, FERRITIN, TIBC, IRON, RETICCTPCT in the last 72 hours. Urine analysis:    Component Value Date/Time   COLORURINE YELLOW 05/11/2016 1600   APPEARANCEUR CLEAR 05/11/2016 1600    LABSPEC 1.021 05/11/2016 1600   PHURINE 5.5 05/11/2016 1600   GLUCOSEU NEGATIVE 05/11/2016 1600   GLUCOSEU NEGATIVE 11/07/2013 0754   HGBUR NEGATIVE 05/11/2016 1600   BILIRUBINUR NEGATIVE 05/11/2016 1600   BILIRUBINUR neg 03/08/2015 1105   KETONESUR NEGATIVE 05/11/2016 1600   PROTEINUR NEGATIVE 05/11/2016 1600   UROBILINOGEN negative 03/08/2015 1105   UROBILINOGEN 0.2 02/09/2014 1805   NITRITE NEGATIVE 05/11/2016 1600   LEUKOCYTESUR NEGATIVE 05/11/2016 1600   Sepsis Labs: _0 (procalcitonin:4,lacticidven:4) )No results found for this or any previous visit (from the past 240 hour(s)).   Radiological Exams on Admission: Dg Chest 2 View  Result Date: 07/27/2016 CLINICAL DATA:  Generalized chest pressure today.  Recent pneumonia. EXAM: CHEST  2 VIEW COMPARISON:  Chest radiograph July 07, 2016 FINDINGS: Cardiomediastinal silhouette is normal. Mildly calcified aortic knob. Patchy RIGHT middle lobe airspace opacity. Linear densities project LEFT mid and lower lung zone. No pleural effusion. No pneumothorax. Soft tissue planes included osseous structures are nonsuspicious. IMPRESSION: Similar patchy RIGHT middle lobe and lingular/LEFT lung base atelectasis/scarring. Electronically Signed   By: Elon Alas M.D.   On: 07/27/2016 02:21   US Abdomen Limited Ruq  Result Date: 07/27/2016 CLINICAL DATA:  Right upper quadrant pain and chest pain, 4 hours duration. EXAM: US ABDOMEN LIMITED - RIGHT UPPER QUADRANT COMPARISON:  None. FINDINGS: Gallbladder: Multiple mobile calculi, measuring up to 1.2 cm. No gallbladder wall thickening or pericholecystic fluid. The patient was not tender over the gallbladder. Common bile duct: Diameter: 3 mm Liver: Mild coarsening of hepatic echotexture without discrete lesion. IMPRESSION: Cholelithiasis without sonographic evidence of acute cholecystitis. Electronically Signed   By: Andreas Newport M.D.   On: 07/27/2016 03:55    EKG: Independently reviewed.  Sinus rhythm, non-specific IVCD  Assessment/Plan:  1. Chest pain  - Pt with hx of CAD, presents with acute CP and SOB at rest - He was treated with ASA 324 mg, GI cocktail, and Zofran in ED with resolution of pain - Initial troponin wnl and no acute ischemic features on EKG  - He is hypertensive and will be given Lopressor now, continue Lipitor - Monitor on telemetry for ischemic changes, obtain serial troponin measurements, and repeat EKG   2. Acute on chronic anemia; pancytopenia  - WBC is stable at 2,800 with no fever or apparent infection - Platelets stable at 120,000  - Hgb is 8.4 on admission, down from 10-range in October 2017  - Pt denies melena or hematochezia, reports hx of SB AVMs  - Check FOBT, continue Protonix 40 mg IV q12h, type and screen, serial H&H, hold Eliquis for now   3. Acute kidney injury superimposed on CKD stage III  - SCr is 1.74 on admission, up from apparent baseline of 1.4  - Suspect a prerenal azotemia in setting of recent loose stools  - He was given a liter of NS in ED  - Plan to hold Lasix and valsartan for now, SLIV, allow PO fluids, and repeat chem panel   4. Atrial fibrillation, paroxysmal - In a sinus rhythm on admission - CHADS-VASc 5 (age, CHF, CAD, HTN, DM)  - He is anticoagulated with Eliquis, held on admission given drop in Hgb  - Continue amiodarone as tolerated   5. Hypertension  - Hypertensive in ED and treated with Lopressor in setting of chest pain  - Lasix and valsartan held as above given bump in SCr - Continue Diltiazem, Lopressor   6. Chronic diastolic CHF  - Pt appears roughly euvolemic on admission  - TTE (07/14/16) with EF 72-09%, grade 1 diastolic dysfunction, mod AS, mild LAE, and mild TR  - Lasix held on admission in setting of AKI; he was given 1 L NS in ED - SLIV, follow daily wts and strict I/Os, resume Lasix as appropriate   7. Cholelithiasis  - Pt is tender in RUQ with cholelithiasis noted on [name] without evidence  for acute cholecystitis  - May need non-emergent surgical consultation   8. Hypothyroidism - Check TSH given worsening anemia and malaise  - Continue current-dose Synthroid for now    DVT prophylaxis: Eliquis Code Status: Full   Family Communication: Wife updated at bedside Disposition Plan: Observe on telemetry Consults called: None Admission status: Observation    Vianne Bulls, MD [facility] Pager [phone]  If 7PM-7AM, please contact night-coverage [email or web address] Password [email or web address]  07/27/2016, 5:37 AM

## 2016-07-28 ENCOUNTER — Other Ambulatory Visit: Payer: Self-pay | Admitting: *Deleted

## 2016-07-28 DIAGNOSIS — I779 Disorder of arteries and arterioles, unspecified: Secondary | ICD-10-CM

## 2016-07-28 DIAGNOSIS — I480 Paroxysmal atrial fibrillation: Secondary | ICD-10-CM

## 2016-07-28 DIAGNOSIS — I10 Essential (primary) hypertension: Secondary | ICD-10-CM

## 2016-07-28 DIAGNOSIS — I771 Stricture of artery: Secondary | ICD-10-CM

## 2016-07-28 DIAGNOSIS — R079 Chest pain, unspecified: Secondary | ICD-10-CM

## 2016-07-28 DIAGNOSIS — I350 Nonrheumatic aortic (valve) stenosis: Secondary | ICD-10-CM

## 2016-07-28 DIAGNOSIS — G478 Other sleep disorders: Secondary | ICD-10-CM

## 2016-07-28 DIAGNOSIS — I5032 Chronic diastolic (congestive) heart failure: Secondary | ICD-10-CM

## 2016-07-28 LAB — TROPONIN I
TROPONIN I: 1.88 ng/mL — AB (ref <0.03)
Troponin I: 1.58 ng/mL (ref <0.03)

## 2016-07-28 LAB — GLUCOSE, CAPILLARY
GLUCOSE-CAPILLARY: 211 mg/dL — AB (ref 65–99)
GLUCOSE-CAPILLARY: 255 mg/dL — AB (ref 65–99)
GLUCOSE-CAPILLARY: 264 mg/dL — AB (ref 65–99)
Glucose-Capillary: 233 mg/dL — ABNORMAL HIGH (ref 65–99)
Glucose-Capillary: 241 mg/dL — ABNORMAL HIGH (ref 65–99)

## 2016-07-28 LAB — BASIC METABOLIC PANEL
ANION GAP: 7 (ref 5–15)
BUN: 22 mg/dL — ABNORMAL HIGH (ref 6–20)
CALCIUM: 8.8 mg/dL — AB (ref 8.9–10.3)
CHLORIDE: 102 mmol/L (ref 101–111)
CO2: 25 mmol/L (ref 22–32)
Creatinine, Ser: 1.49 mg/dL — ABNORMAL HIGH (ref 0.61–1.24)
GFR calc non Af Amer: 45 mL/min — ABNORMAL LOW (ref >60)
GFR, EST AFRICAN AMERICAN: 52 mL/min — AB (ref >60)
Glucose, Bld: 205 mg/dL — ABNORMAL HIGH (ref 65–99)
POTASSIUM: 4 mmol/L (ref 3.5–5.1)
Sodium: 134 mmol/L — ABNORMAL LOW (ref 135–145)

## 2016-07-28 LAB — HEMOGLOBIN A1C
Hgb A1c MFr Bld: 7.1 % — ABNORMAL HIGH (ref 4.8–5.6)
MEAN PLASMA GLUCOSE: 157 mg/dL

## 2016-07-28 LAB — CBC
HEMATOCRIT: 33.8 % — AB (ref 39.0–52.0)
HEMOGLOBIN: 11.8 g/dL — AB (ref 13.0–17.0)
MCH: 29.5 pg (ref 26.0–34.0)
MCHC: 34.9 g/dL (ref 30.0–36.0)
MCV: 84.5 fL (ref 78.0–100.0)
Platelets: 129 10*3/uL — ABNORMAL LOW (ref 150–400)
RBC: 4 MIL/uL — AB (ref 4.22–5.81)
RDW: 15.6 % — ABNORMAL HIGH (ref 11.5–15.5)
WBC: 5.4 10*3/uL (ref 4.0–10.5)

## 2016-07-28 LAB — HEPARIN LEVEL (UNFRACTIONATED): HEPARIN UNFRACTIONATED: 0.51 [IU]/mL (ref 0.30–0.70)

## 2016-07-28 LAB — APTT
APTT: 47 s — AB (ref 24–36)
aPTT: 67 seconds — ABNORMAL HIGH (ref 24–36)
aPTT: 65 seconds — ABNORMAL HIGH (ref 24–36)

## 2016-07-28 MED ORDER — INSULIN ASPART 100 UNIT/ML ~~LOC~~ SOLN
0.0000 [IU] | Freq: Three times a day (TID) | SUBCUTANEOUS | Status: DC
  Administered 2016-07-28 – 2016-07-29 (×2): 5 [IU] via SUBCUTANEOUS

## 2016-07-28 MED ORDER — SODIUM CHLORIDE 0.9 % IV SOLN
INTRAVENOUS | Status: DC
  Administered 2016-07-28 – 2016-07-29 (×2): via INTRAVENOUS

## 2016-07-28 MED ORDER — INSULIN ASPART 100 UNIT/ML ~~LOC~~ SOLN
0.0000 [IU] | Freq: Every day | SUBCUTANEOUS | Status: DC
  Administered 2016-07-28: 3 [IU] via SUBCUTANEOUS

## 2016-07-28 MED ORDER — SODIUM CHLORIDE 0.9% FLUSH: 3.0000 mL | INTRAVENOUS | Status: DC | PRN

## 2016-07-28 MED ORDER — SODIUM CHLORIDE 0.9% FLUSH: 3.0000 mL | Freq: Two times a day (BID) | INTRAVENOUS | Status: DC

## 2016-07-28 MED ORDER — SODIUM CHLORIDE 0.9 % IV SOLN: 250.0000 mL | INTRAVENOUS | Status: DC | PRN

## 2016-07-28 MED ORDER — ASPIRIN 81 MG PO CHEW
81.0000 mg | CHEWABLE_TABLET | ORAL | Status: AC
  Administered 2016-07-29: 81 mg via ORAL
  Filled 2016-07-28: qty 1

## 2016-07-28 NOTE — Progress Notes (Signed)
PROGRESS NOTE    Michael [name]  PQZ:[id number] DOB: [birth year] DOA: 07/27/2016 PCP: [name], MD   Outpatient Specialists:     Brief Narrative:  Michael [name] is a 76 y.o. male with medical history significant for insulin-dependent diabetes mellitus, hypertension, coronary artery disease, chronic kidney disease stage III, paroxysmal atrial fibrillation on Eliquis, and reported small bowel AVMs who presents to the emergency department with chest pain and dyspnea. Patient reports that he had been in his usual state of health aside from some loose stools, but shortly after lying down to sleep tonight, developed acute onset of pressure sensation in the mid to lower chest with acute dyspnea. There was radiation of the pain down the left arm, but no diaphoresis or nausea. Patient also describes a general malaise and vague sense of unwellness. He did not attempt any interventions prior to coming in for evaluation. He denies any recent fevers or chills and has not had a cough. He denies any significant extremity edema and denies palpitations. He denies any vomiting, melena, or hematochezia.  ED Course: Upon arrival to the ED, patient is found to be afebrile, saturating adequately on room air, and with vitals otherwise stable. EKG featured sinus rhythm with nonspecific interventricular conduction delay. Chest x-ray features some patchy right middle lobe and lingular atelectasis or scarring. Chemistry panel was notable for a potassium of 5.2, BUN of 26, and serum creatinine 1.74, up from an apparent baseline of 1.4. Glucose is elevated to a value of 449. CBC features of pancytopenia with stable leukopenia of 2800, stable thrombocytopenia with platelets 121,000, and a hemoglobin of 8.4, down from the 10 range when last checked in October. Patient was given a 324 mg aspirin in the emergency department, a GI cocktail, Zofran, and 1 L of normal saline. There was concern for a possible GI bleed and he was  started on IV Protonix 40 mg IV. Patient has had no respiratory distress in the ED and has remained hemodynamically stable. There is been no recurrence of his chest pain. He will be observed on the telemetry unit for ongoing evaluation and management of chest pain concerning for possible ACS, as well as acute on chronic kidney disease and acute on chronic anemia concerning for possible GI bleeding.   Assessment & Plan:   Principal Problem:   Chest pain Active Problems:   Pancytopenia (HCC)   DM (diabetes mellitus), type 2 with peripheral vascular complications (HCC)   PAF (paroxysmal atrial fibrillation) (HCC)   Essential hypertension   Cholelithiasis   Hypothyroidism   Acute renal failure superimposed on stage 3 chronic kidney disease (HCC)   Hyperkalemia   GIB (gastrointestinal bleeding)   Chronic diastolic CHF (congestive heart failure) (HCC)   Chest pain-- patient describes more of a RUQ pain to me - Pt with hx of CAD - He was treated with ASA 324 mg, GI cocktail, and Zofran in ED with resolution of pain - troponin trending up-- cardiology consult -heparin started - Monitor on telemetry for ischemic changes  DM -SSI -lantus -HgbA1c: 7.1-- improved from baseline  Acute on chronic anemia; pancytopenia  - suspect Hgb of 8 was lab error-- repeat is 11  Acute kidney injury superimposed on CKD stage III  - SCr is 1.74 on admission, up from apparent baseline of 1.4  - Suspect a prerenal azotemia in setting of recent loose stools  - BMP in AM  Atrial fibrillation, paroxysmal - In a sinus rhythm on admission - CHADS-VASc 5 (age,  CHF, CAD, HTN, DM)  - He is anticoagulated with Eliquis, held on admission given drop in Hgb  - Continue amiodarone as tolerated  Hypertension  - Hypertensive in ED and treated with Lopressor in setting of chest pain  - Lasix and valsartan held as above given bump in SCr - Continue Diltiazem, Lopressorwith PRN hydralazine  Chronic  diastolic CHF - Pt appears roughly euvolemic on admission  - TTE (07/14/16) with EF 16-96%, grade 1 diastolic dysfunction, mod AS, mild LAE, and mild TR   Cholelithiasis  - Pt is tender in RUQ with cholelithiasis noted on [name] without evidence for acute cholecystitis  - HIDA vs outpatient referral to [facility]  Hypothyroidism - TSH WNL - Continue current-dose Synthroid for now    DVT prophylaxis:  heparin  Code Status: Full Code   Family Communication:   Disposition Plan:     Consultants:   cardiology    Subjective: No chest pain and ate fine yesterday  Objective: Vitals:   07/27/16 2019 07/27/16 2152 07/28/16 0440 07/28/16 0925  BP: (!) 180/66 (!) 156/65 (!) 165/57 (!) 150/73  Pulse: (!) 55 61 (!) 58 61  Resp: 18  20   Temp: 97.8 F (36.6 C)  98.2 F (36.8 C)   TempSrc: Oral  Oral   SpO2: 98%  98% 99%  Weight:   117.6 kg (259 lb 4.8 oz)   Height:        Intake/Output Summary (Last 24 hours) at 07/28/16 1301 Last data filed at 07/28/16 0800  Gross per 24 hour  Intake           865.15 ml  Output                0 ml  Net           865.15 ml   Filed Weights   07/27/16 0638 07/28/16 0440  Weight: 119.1 kg (262 lb 8 oz) 117.6 kg (259 lb 4.8 oz)    Examination:  General exam: Appears calm and comfortable  Respiratory system: Clear to auscultation. Respiratory effort normal. Cardiovascular system: S1 & S2 heard, RRR. + murmurs Gastrointestinal system: Abdomen is nondistended, soft and nontender. No organomegaly or masses felt. Normal bowel sounds heard. Central nervous system: Alert and oriented. No focal neurological deficits.     Data Reviewed: I have personally reviewed following labs and imaging studies  CBC:  Recent Labs Lab 07/27/16 0156 07/27/16 0957 07/28/16 0258  WBC 2.8*  --  5.4  HGB 8.4* 11.8* 11.8*  HCT 22.7* 32.3* 33.8*  MCV 83.2  --  84.5  PLT 121*  --  789*   Basic Metabolic Panel:  Recent Labs Lab 07/27/16 0156  07/27/16 0957 07/28/16 0258  NA 136  --  134*  K 5.2* 4.3 4.0  CL 105  --  102  CO2 19*  --  25  GLUCOSE 449*  --  205*  BUN 26*  --  22*  CREATININE 1.74*  --  1.49*  CALCIUM 8.3*  --  8.8*   GFR: Estimated Creatinine Clearance: 57.6 mL/min (by C-G formula based on SCr of 1.49 mg/dL (H)). Liver Function Tests:  Recent Labs Lab 07/27/16 0156  AST 60*  ALT 22  ALKPHOS 33*  BILITOT 1.4*  PROT 5.9*  ALBUMIN 3.5    Recent Labs Lab 07/27/16 0156  LIPASE 43   No results for input(s): AMMONIA in the last 168 hours. Coagulation Profile: No results for input(s): INR, PROTIME in the last  168 hours. Cardiac Enzymes:  Recent Labs Lab 07/27/16 0536 07/27/16 0957 07/27/16 1641  TROPONINI 0.06* 0.29* 1.63*   BNP (last 3 results) No results for input(s): PROBNP in the last 8760 hours. HbA1C:  Recent Labs  07/27/16 0536  HGBA1C 7.1*   CBG:  Recent Labs Lab 07/27/16 2012 07/27/16 2356 07/28/16 0426 07/28/16 0831 07/28/16 1111  GLUCAP 255* 170* 211* 233* 241*   Lipid Profile: No results for input(s): CHOL, HDL, LDLCALC, TRIG, CHOLHDL, LDLDIRECT in the last 72 hours. Thyroid Function Tests:  Recent Labs  07/27/16 0536  TSH 4.452   Anemia Panel:  Recent Labs  07/27/16 0957  FERRITIN 456*  TIBC 371  IRON 62   Urine analysis:    Component Value Date/Time   COLORURINE YELLOW 05/11/2016 1600   APPEARANCEUR CLEAR 05/11/2016 1600   LABSPEC 1.021 05/11/2016 1600   PHURINE 5.5 05/11/2016 1600   GLUCOSEU NEGATIVE 05/11/2016 1600   GLUCOSEU NEGATIVE 11/07/2013 0754   HGBUR NEGATIVE 05/11/2016 1600   BILIRUBINUR NEGATIVE 05/11/2016 1600   BILIRUBINUR neg 03/08/2015 1105   KETONESUR NEGATIVE 05/11/2016 1600   PROTEINUR NEGATIVE 05/11/2016 1600   UROBILINOGEN negative 03/08/2015 1105   UROBILINOGEN 0.2 02/09/2014 1805   NITRITE NEGATIVE 05/11/2016 1600   LEUKOCYTESUR NEGATIVE 05/11/2016 1600   Sepsis  Labs: @LABRCNTIP (procalcitonin:4,lacticidven:4)  )No results found for this or any previous visit (from the past 240 hour(s)).    Anti-infectives    None       Radiology Studies: Dg Chest 2 View  Result Date: 07/27/2016 CLINICAL DATA:  Generalized chest pressure today.  Recent pneumonia. EXAM: CHEST  2 VIEW COMPARISON:  Chest radiograph July 07, 2016 FINDINGS: Cardiomediastinal silhouette is normal. Mildly calcified aortic knob. Patchy RIGHT middle lobe airspace opacity. Linear densities project LEFT mid and lower lung zone. No pleural effusion. No pneumothorax. Soft tissue planes included osseous structures are nonsuspicious. IMPRESSION: Similar patchy RIGHT middle lobe and lingular/LEFT lung base atelectasis/scarring. Electronically Signed   By: Elon Alas M.D.   On: 07/27/2016 02:21   US Abdomen Limited Ruq  Result Date: 07/27/2016 CLINICAL DATA:  Right upper quadrant pain and chest pain, 4 hours duration. EXAM: US ABDOMEN LIMITED - RIGHT UPPER QUADRANT COMPARISON:  None. FINDINGS: Gallbladder: Multiple mobile calculi, measuring up to 1.2 cm. No gallbladder wall thickening or pericholecystic fluid. The patient was not tender over the gallbladder. Common bile duct: Diameter: 3 mm Liver: Mild coarsening of hepatic echotexture without discrete lesion. IMPRESSION: Cholelithiasis without sonographic evidence of acute cholecystitis. Electronically Signed   By: Andreas Newport M.D.   On: 07/27/2016 03:55        Scheduled Meds: . allopurinol  200 mg Oral Daily  . amiodarone  200 mg Oral Daily  . atorvastatin  40 mg Oral q1800  . diltiazem  180 mg Oral Daily  . fenofibrate  160 mg Oral Daily  . insulin aspart  0-15 Units Subcutaneous Q4H  . insulin glargine  50 Units Subcutaneous Daily  . levothyroxine  150 mcg Oral QAC breakfast  . metoprolol  50 mg Oral BID  . pantoprazole  40 mg Intravenous Q12H   Continuous Infusions: . heparin 1,800 Units/hr (07/28/16 0205)      LOS: 1 day    Time spent: 25 min    [place], DO [facility] Pager [phone]  If 7PM-7AM, please contact night-coverage [email or web address] Password [email or web address] 07/28/2016, 1:01 PM

## 2016-07-28 NOTE — Progress Notes (Signed)
Michael [name] for heparin (apixaban PTA) Indication: chest pain/ACS and atrial fibrillation  Allergies  Allergen Reactions  . Percocet [Oxycodone-Acetaminophen] Nausea And Vomiting  . Invokana [Canagliflozin]     Side effects  . Metformin And Related     Upset stomach    Patient Measurements: Height: 5\' 11"  (180.3 cm) Weight: 259 lb 4.8 oz (117.6 kg) IBW/kg (Calculated) : 75.3 Heparin Dosing Weight: 101 kg   Vital Signs: Temp: 98.2 F (36.8 C) (01/02 0440) Temp Source: Oral (01/02 0440) BP: 165/57 (01/02 0440) Pulse Rate: 58 (01/02 0440)  Labs:  Recent Labs  07/27/16 0156 07/27/16 0536 07/27/16 0957 07/27/16 1641 07/28/16 0008 07/28/16 0258  HGB 8.4*  --  11.8*  --   --  11.8*  HCT 22.7*  --  32.3*  --   --  33.8*  PLT 121*  --   --   --   --  129*  APTT  --   --   --  33 47*  --   HEPARINUNFRC  --   --   --  0.52  --   --   CREATININE 1.74*  --   --   --   --  1.49*  TROPONINI  --  0.06* 0.29* 1.63*  --   --     Estimated Creatinine Clearance: 57.6 mL/min (by C-G formula based on SCr of 1.49 mg/dL (H)).   Medical History: Past Medical History:  Diagnosis Date  . Allergy   . Arthritis   . AVM (arteriovenous malformation) of colon   . CAD (coronary artery disease)    a. Cath 10/2014 - Mild to moderate diagonal, circumflex and obtuse marginal disease. Innominate artery sternosis.  . Carotid artery disease ([place])    a. Carotid dopple 03/2014 16-10% RICA &  96-04% LICA stenosis b. 5/40  . CKD (chronic kidney disease), stage III   . Clostridium difficile colitis 05/13/2016  . Dysrhythmia    ATRIAL FIBRILATION  . Elevated troponin    a. 09/2014 in setting of AF RVR-->Myoview: EF 49%, no ischemia/infarct->Med Rx.  . GERD (gastroesophageal reflux disease)   . Gout   . H/O transfusion of whole blood   . History of PFTs    PFTs 6/16:  FVC 3.73 (87%), FEV1 2.93 (88%), FEV1/FVC 78%, DLCO 69%  . Hx of adenomatous colonic polyps  07/02/2016  . Hyperlipidemia   . Hypertension   . Hypothyroidism   . Iron deficiency anemia   . PAF (paroxysmal atrial fibrillation) ([place])    a. 09/2014: Converted on Dilt;  b. CHA2DS2VASc = 3-->eliquis;  c. 09/2014 Echo: EF 55-60%, mild LVH, mildly dil LA.  [name] Personal history of colonic polyps 2007, 2008   adenoma each time, largest 12 mm in 2007  . Psoriasis   . Type II diabetes mellitus ([place])    TYPE 2    Assessment: 62 yoM continuing on IV heparin for r/o ACS, troponins trending up. On apixaban prior to admission for atrial fibrillation. Apixaban now on hold, with last dose 12/31 PM. Heparin levels elevated due to apixaban exposure, so aPTTs will be used to monitor anticoagulation effects until heparin levels correlating. Hgb stable at 11.8 with noted chronic pancytopenia. No bleeding documented. Cardiology consulted.  APTT now therapeutic (67) on heparin at 1800 units/h. Heparin level 0.51.   Goal of Therapy:  Heparin level 0.3-0.7 units/ml aPTT 66-102 seconds Monitor platelets by anticoagulation protocol: Yes    Plan:  Heparin infusion at 1800 units/hr  8h APTT to confirm Daily aPTT/heparin level/CBC Monitor for s/sx bleeding Apixaban on hold - F/u Cards plans   Elicia Lamp, PharmD, BCPS Clinical Pharmacist 07/28/2016 9:01 AM

## 2016-07-28 NOTE — Progress Notes (Signed)
ANTICOAGULATION CONSULT NOTE - Follow Up Consult  Pharmacy Consult for heparin Indication: chest pain/ACS and atrial fibrillation  Labs:  Recent Labs  07/27/16 0156 07/27/16 0536 07/27/16 0957 07/27/16 1641 07/28/16 0008  HGB 8.4*  --  11.8*  --   --   HCT 22.7*  --  32.3*  --   --   PLT 121*  --   --   --   --   APTT  --   --   --  33 47*  HEPARINUNFRC  --   --   --  0.52  --   CREATININE 1.74*  --   --   --   --   TROPONINI  --  0.06* 0.29* 1.63*  --     Assessment: 75yo male below goal on heparin with initial dosing while Eliquis on hold.  Goal of Therapy:  aPTT 66-102 seconds   Plan:  Will increase heparin gtt by 3 units/kg/hr to 1800 units/hr and check PTT in 8hr.  Wynona Neat, PharmD, BCPS  07/28/2016,1:48 AM

## 2016-07-28 NOTE — Consult Note (Addendum)
CARDIOLOGY CONSULT NOTE   Patient ID: Michael [name] MRN: [id number] DOB/AGE: October 09, 1940 76 y.o.  Admit date: 07/27/2016  Primary Physician   [name], MD Primary Cardiologist   Dr. Johnsie Cancel PV: Dr. Fletcher Anon Reason for Consultation   NSTEMI Requesting Physician  Dr. Eliseo Squires  HPI: Michael [name] is a 76 y.o. male with a history of non obstructive CAD, PAF on eliquis, prior hx of GI bleed due to AVM requiring blood transfusion, , CKD stage III, HTN, HLD, IDA, DM, bilateral carotid stenosis and right subclavian artery stenosis who came by EMS for chest pain and dyspnea 07/27/16.   He had cardiac catheterization in April of 2016 which demonstrated mild to moderate diagonal, circumflex and OM disease, normal LV function and borderline significant innominate artery stenosis.  The catheterization was done via the right radial artery in the patient was noted to have calcified stenosis involving the right innominate artery. The patient underwent carotid US and upper extremity arterial duplex. He has bilateral ICA stenosis 40-59%. There was evidence of more proximal obstruction in the right upper extremity based upon waveform dampening.  The patient went on to have a CT angiogram which showed  Greater than 50% stenosis of the right subclavian artery.  The patient has known history of psoriasis arthritis affecting his hands, wrists and shoulders. He has significant contraction in his right wrist. His symptoms improved significantly with Humira.   Last seen by Dr. Fletcher Anon 06/16/16. Right subclavian stenosis was felt to be mostly asymptomatic and recommended medical therapy. Complain of exertional dyspnea since hospitalization for pneumonia in Aug 2017. Noted systolic murmur. F/u echo showed 06/2016 showed  normal EF with moderate aortic stenosis. There was moderate pulmonary hypertension. Increased lasix to 40 mg once daily.  Seen in usual state of health up until midnight Sunday. He was laying on a bed and had a  sudden onset epigastric/lower sternal pressure that radiated to his left shoulder. He also had a nausea felt like "burping sensation". Noted blood pressure of 195/104. He also had a severe shortness of breath leading to EMS activation. No improvement of sublingual nitroglycerin x 2. Pain resolved after Zofran and GI cocktail in ER. Patient also complains of chronic abdominal pain for the past 3 months. This has been somewhat similar to when he had a GI bleed in 2012.  EKG shows normal sinus rhythm with nonspecific T-wave inversion in lateral lead which appears similar to prior EKGs. BNP normal. Troponin I was 0.06 which trended up to 1.63. On IV heparin. Hemoglobin A1c 7.1. Blood sugar has been running high. No chest pain since admission. Hemoglobin 8.4 with pancytopenia. Lipase normal. Abdominal ultrasound shows cholelithiasis without evidence of acute cholecystitis.  Cardiology asked for further management of non-STEMI. The patient denies any palpitation, lower extremity edema, orthopnea, PND, syncope.   Past Medical History:  Diagnosis Date  . Allergy   . Arthritis   . AVM (arteriovenous malformation) of colon   . CAD (coronary artery disease)    a. Cath 10/2014 - Mild to moderate diagonal, circumflex and obtuse marginal disease. Innominate artery sternosis.  . Carotid artery disease ([place])    a. Carotid dopple 03/2014 40-98% RICA &  11-91% LICA stenosis b. 4/78  . CKD (chronic kidney disease), stage III   . Clostridium difficile colitis 05/13/2016  . Dysrhythmia    ATRIAL FIBRILATION  . Elevated troponin    a. 09/2014 in setting of AF RVR-->Myoview: EF 49%, no ischemia/infarct->Med Rx.  . GERD (gastroesophageal reflux  disease)   . Gout   . H/O transfusion of whole blood   . History of PFTs    PFTs 6/16:  FVC 3.73 (87%), FEV1 2.93 (88%), FEV1/FVC 78%, DLCO 69%  . Hx of adenomatous colonic polyps 07/02/2016  . Hyperlipidemia   . Hypertension   . Hypothyroidism   . Iron deficiency anemia     . PAF (paroxysmal atrial fibrillation) ([place])    a. 09/2014: Converted on Dilt;  b. CHA2DS2VASc = 3-->eliquis;  c. 09/2014 Echo: EF 55-60%, mild LVH, mildly dil LA.  [name] Personal history of colonic polyps 2007, 2008   adenoma each time, largest 12 mm in 2007  . Psoriasis   . Type II diabetes mellitus ([place])    TYPE 2     Past Surgical History:  Procedure Laterality Date  . APPENDECTOMY  02/09/2014  . COLONOSCOPY  02/10/2011   internal hemorrhoids  . COLONOSCOPY W/ POLYPECTOMY  04/09/2006   12 mm adenoma  . COLONOSCOPY W/ POLYPECTOMY  06/17/2007   5 mm adenoma  . COLONOSCOPY WITH PROPOFOL N/A 05/13/2016   Procedure: COLONOSCOPY WITH PROPOFOL;  Surgeon: Manus Gunning, MD;  Location: [facility];  Service: Gastroenterology;  Laterality: N/A;  . ESOPHAGOGASTRODUODENOSCOPY  12/23/2011   Procedure: ESOPHAGOGASTRODUODENOSCOPY (EGD);  Surgeon: Irene Shipper, MD;  Location: [facility];  Service: Endoscopy;  Laterality: N/A;  with small bowel bx's  . GIVENS CAPSULE STUDY  12/28/2011  . KNEE ARTHROSCOPY Right   . LAPAROSCOPIC APPENDECTOMY N/A 02/09/2014   Procedure: APPENDECTOMY LAPAROSCOPIC;  Surgeon: Leighton Ruff, MD;  Location: [facility] ORS;  Service: General;  Laterality: N/A;  . LEFT HEART CATHETERIZATION WITH CORONARY ANGIOGRAM N/A 11/15/2014   Procedure: LEFT HEART CATHETERIZATION WITH CORONARY ANGIOGRAM;  Surgeon: Belva Crome, MD;  Location: [facility] CATH LAB;  Service: Cardiovascular;  Laterality: N/A;  . ROTATOR CUFF REPAIR     left    Allergies  Allergen Reactions  . Percocet [Oxycodone-Acetaminophen] Nausea And Vomiting  . Invokana [Canagliflozin]     Side effects  . Metformin And Related     Upset stomach    I have reviewed the patient's current medications . allopurinol  200 mg Oral Daily  . amiodarone  200 mg Oral Daily  . atorvastatin  40 mg Oral q1800  . diltiazem  180 mg Oral Daily  . fenofibrate  160 mg Oral Daily  . insulin aspart  0-15 Units Subcutaneous Q4H  .  insulin glargine  50 Units Subcutaneous Daily  . levothyroxine  150 mcg Oral QAC breakfast  . metoprolol  50 mg Oral BID  . pantoprazole  40 mg Intravenous Q12H   . heparin 1,800 Units/hr (07/28/16 0205)   acetaminophen, hydrALAZINE, HYDROcodone-acetaminophen, meclizine, nitroGLYCERIN, ondansetron (ZOFRAN) IV  Prior to Admission medications   Medication Sig Start Date End Date Taking? Authorizing Provider  acetaminophen (TYLENOL) 650 MG CR tablet Take 1,300 mg by mouth every 8 (eight) hours as needed for pain.    Yes Historical Provider, MD  allopurinol (ZYLOPRIM) 100 MG tablet Take 2 tablets (200 mg total) by mouth daily. 02/03/16  Yes Evie Lacks [name], MD  amiodarone (PACERONE) 200 MG tablet TAKE 1 TABLET BY MOUTH DAILY 06/24/16  Yes Wellington Hampshire, MD  apixaban (ELIQUIS) 5 MG TABS tablet Take 1 tablet (5 mg total) by mouth 2 (two) times daily. 06/02/16  Yes Wellington Hampshire, MD  atorvastatin (LIPITOR) 40 MG tablet Take 1 tablet (40 mg total) by mouth daily. 03/09/16  Yes Josue Hector,  MD  Cholecalciferol 1000 UNITS TBDP Take 1,000-5,000 Units by mouth dail   y. Takes 1000 units everyday except for on Saturday patient takes 5000 units   Yes Historical Provider, MD  diltiazem (CARDIZEM CD) 180 MG 24 hr capsule Take 1 capsule (180 mg total) by mouth daily. 12/16/15  Yes Wellington Hampshire, MD  fenofibrate 160 MG tablet TAKE 1 TABLET(160 MG) BY MOUTH DAILY 08/09/15  Yes Darlin Coco, MD  ferrous sulfate 325 (65 FE) MG tablet Take 325 mg by mouth 3 (three) times daily with meals.   Yes Historical Provider, MD  furosemide (LASIX) 40 MG tablet Take 1 tablet (40 mg total) by mouth daily. 07/22/16  Yes Wellington Hampshire, MD  glipiZIDE (GLUCOTROL) 10 MG tablet Take 10 mg by mouth daily. 05/06/16  Yes Historical Provider, MD  HUMIRA PEN 40 MG/0.8ML PNKT Inject 40 mg into the skin every 14 (fourteen) days. 06/18/15  Yes Historical Provider, MD  HYDROcodone-acetaminophen (NORCO/VICODIN) 5-325 MG  tablet Take 1 tablet by mouth 2 (two) times daily as needed for moderate pain or severe pain. 12/16/15  Yes Evie Lacks [name], MD  Insulin Glargine (TOUJEO SOLOSTAR) 300 UNIT/ML SOPN Inject 50 Units into the skin daily. Titrate up by 1 unit every 1-2 days for goal sugars of 120-150 Patient taking differently: Inject 52 Units into the skin daily.  04/17/16 04/17/20 Yes Evie Lacks [name], MD  levothyroxine (SYNTHROID, LEVOTHROID) 150 MCG tablet TAKE 1 TABLET BY MOUTH DAILY BEFORE BREAKFAST 03/16/16  Yes Cassandria Anger, MD  meclizine (ANTIVERT) 12.5 MG tablet Take 1 tablet (12.5 mg total) by mouth 2 (two) times daily as needed for dizziness (do not use for more than 3days). 07/07/16  Yes Charlene Brooke Nche, NP  metoprolol (LOPRESSOR) 50 MG tablet TAKE 1 TABLET(50 MG) BY MOUTH TWICE DAILY 06/25/16  Yes Wellington Hampshire, MD  omeprazole (PRILOSEC) 40 MG capsule TAKE 1 CAPSULE BY MOUTH EVERY DAY 05/04/16  Yes Evie Lacks [name], MD  valsartan (DIOVAN) 160 MG tablet Take 1 tablet (160 mg total) by mouth daily. 06/05/16  Yes Tanda Rockers, MD  Blood Glucose Monitoring Suppl (ONE TOUCH ULTRA 2) W/DEVICE KIT Use as directed Dx E11.9 05/18/14   Evie Lacks [name], MD  Blood Pressure Monitor KIT Use to check blood pressure daily Dx I10 10/04/15   Evie Lacks [name], MD  glucose blood (ONE TOUCH ULTRA TEST) test strip 1 each by Other route 4 (four) times daily. Use to check blood sugar four times a day  Dx: E11.9- insulin Dependant 04/06/16   Evie Lacks [name], MD  Insulin Pen Needle 31G X 5 MM MISC 1 Units by Does not apply route 4 (four) times daily. 03/17/16   Evie Lacks [name], MD  Lancets Glory Rosebush ULTRASOFT) lancets Use to check blood sugars daily 05/18/14   Cassandria Anger, MD     Social History   Social History  . Marital status: Married    Spouse name: N/A  . Number of children: N/A  . Years of education: 26   Occupational History  .  Retired    Social History Main Topics  .  Smoking status: Former Smoker    Packs/day: 0.70    Years: 48.00    Types: Cigarettes    Quit date: 03/29/2006  . Smokeless tobacco: Never Used  . Alcohol use 0.6 oz/week    1 Cans of beer per week     Comment: occasional alcohol intake  . Drug use: No  .  Sexual activity: Not Currently   Other Topics Concern  . Not on file   Social History Narrative   Regular exercise-no   Caffeine Use-yes    Family Status  Relation Status  . Father Deceased  . Mother Deceased  . Brother Deceased  . Brother Alive  . Maternal Grandmother Deceased  . Maternal Grandfather Deceased  . Paternal Grandmother Deceased  . Paternal Grandfather Deceased  . Sister   . Neg Hx    Family History  Problem Relation Age of Onset  . Heart attack Father   . Lung cancer Father   . Diabetes Father   . Stroke Father   . Hypertension Father   . Stroke Mother   . Hypertension Mother   . Cancer Brother     liver  . Heart disease Brother     chf  . COPD Sister   . Malignant hyperthermia Neg Hx      ROS:  Full 14 point review of systems complete and found to be negative unless listed above.  Physical Exam: Blood pressure (!) 150/73, pulse 61, temperature 98.2 F (36.8 C), temperature source Oral, resp. rate 20, height 5' 11" (1.803 m), weight 259 lb 4.8 oz (117.6 kg), SpO2 99 %.  General: Well developed, well nourished, male in no acute distress Head: Eyes PERRLA, No xanthomas. Normocephalic and atraumatic, oropharynx without edema or exudate.  Lungs: Resp regular and unlabored, CTA. Heart: RRR no s3, s4. Systolic murmurs..   Neck: No carotid bruits. No lymphadenopathy.  No JVD. Abdomen: Bowel sounds present, abdomen soft and non-tender without masses or hernias noted. Msk:  No spine or cva tenderness. No weakness, no joint deformities or effusions. Extremities: No clubbing, cyanosis or edema. DP/PT/Radials 2+ and equal bilaterally. Neuro: Alert and oriented X 3. No focal deficits noted. Psych:  Good  affect, responds appropriately Skin: No rashes or lesions noted.  Labs:   Lab Results  Component Value Date   WBC 5.4 07/28/2016   HGB 11.8 (L) 07/28/2016   HCT 33.8 (L) 07/28/2016   MCV 84.5 07/28/2016   PLT 129 (L) 07/28/2016   No results for input(s): INR in the last 72 hours.  Recent Labs Lab 07/27/16 0156  07/28/16 0258  NA 136  --  134*  K 5.2*  < > 4.0  CL 105  --  102  CO2 19*  --  25  BUN 26*  --  22*  CREATININE 1.74*  --  1.49*  CALCIUM 8.3*  --  8.8*  PROT 5.9*  --   --   BILITOT 1.4*  --   --   ALKPHOS 33*  --   --   ALT 22  --   --   AST 60*  --   --   GLUCOSE 449*  --  205*  ALBUMIN 3.5  --   --   < > = values in this interval not displayed. Magnesium  Date Value Ref Range Status  10/11/2014 2.0 1.5 - 2.5 mg/dL Final    Comment:    POST-ULTRACENTRIFUGATION HEMOLYZED SPECIMEN, RESULTS MAY BE AFFECTED     Recent Labs  07/27/16 0536 07/27/16 0957 07/27/16 1641  TROPONINI 0.06* 0.29* 1.63*    Recent Labs  07/27/16 0205 07/27/16 0518  TROPIPOC 0.03 0.09*   No results found for: PROBNP Lab Results  Component Value Date   CHOL 100 03/20/2015   HDL 22.30 (L) 03/20/2015   LDLCALC UNABLE TO CALCULATE IF TRIGLYCERIDE OVER 400 mg/dL 10/12/2014  TRIG (H) 03/20/2015    795.0 Triglyceride is over 400; calculations on Lipids are invalid.   Lab Results  Component Value Date   DDIMER <0.19 05/08/2016   Lipase  Date/Time Value Ref Range Status  07/27/2016 01:56 AM 43 11 - 51 U/L Final   TSH  Date/Time Value Ref Range Status  07/27/2016 05:36 AM 4.452 0.350 - 4.500 uIU/mL Final    Comment:    Performed by a 3rd Generation assay with a functional sensitivity of <=0.01 uIU/mL.  09/17/2015 03:33 PM 1.89 0.35 - 4.50 uIU/mL Final   Vitamin B-12  Date/Time Value Ref Range Status  11/07/2013 07:54 AM 600 211 - 911 pg/mL Final   Folate  Date/Time Value Ref Range Status  12/22/2011 05:00 AM 10.3 ng/mL Final    Comment:    (NOTE) Reference  Ranges        Deficient:       0.4 - 3.3 ng/mL        Indeterminate:   3.4 - 5.4 ng/mL        Normal:              > 5.4 ng/mL   Ferritin  Date/Time Value Ref Range Status  07/27/2016 09:57 AM 456 (H) 24 - 336 ng/mL Final    Comment:    POST-ULTRACENTRIFUGATION  01/16/2014 09:29 AM 496 (H) 22 - 316 ng/ml Final   TIBC  Date/Time Value Ref Range Status  07/27/2016 09:57 AM 371 250 - 450 ug/dL Final  01/16/2014 09:29 AM 281 202 - 409 ug/dL Final   Iron  Date/Time Value Ref Range Status  07/27/2016 09:57 AM 62 45 - 182 ug/dL Final    Comment:    POST-ULTRACENTRIFUGATION  01/16/2014 09:29 AM 135 42 - 163 ug/dL Final   Retic Ct Pct  Date/Time Value Ref Range Status  12/22/2011 05:02 AM 3.7 (H) 0.4 - 3.1 % Final    Echo: 07/14/16  LV EF: 60% -   65%  ------------------------------------------------------------------- Indications:      SOB (R06.00).  ------------------------------------------------------------------- History:   Risk factors:  Hypertension. Diabetes mellitus. Dyslipidemia.  ------------------------------------------------------------------- Study Conclusions  - Left ventricle: The cavity size was normal. There was moderate   concentric hypertrophy. Systolic function was normal. The   estimated ejection fraction was in the range of 60% to 65%. Wall   motion was normal; there were no regional wall motion   abnormalities. Doppler parameters are consistent with abnormal   left ventricular relaxation (grade 1 diastolic dysfunction).   Doppler parameters are consistent with indeterminate ventricular   filling pressure. - Aortic valve: Trileaflet; mildly thickened, mildly calcified   leaflets. Valve mobility was restricted. There was moderate   stenosis. There was no regurgitation. Peak velocity (S): 332   cm/s. Mean gradient (S): 26 mm Hg. Valve area (VTI): 1.33 cm^2.   Valve area (Vmax): 1.32 cm^2. Valve area (Vmean): 1.22 cm^2. - Mitral valve:  Transvalvular velocity was within the normal range.   There was no evidence for stenosis. There was no regurgitation. - Left atrium: The atrium was mildly dilated. - Right ventricle: The cavity size was normal. Wall thickness was   normal. Systolic function was normal. - Atrial septum: No defect or patent foramen ovale was identified. - Tricuspid valve: There was mild regurgitation. - Pulmonary arteries: Systolic pressure was moderately increased.   PA peak pressure: 52 mm Hg (S).  Cath: 11/15/14 ANGIOGRAPHIC DATA:   The left main coronary artery is widely patent.  The left   anterior descending artery is widely patent and wraps around the left ventricular apex. The first diagonal contains 50-70% narrowing in its dominant branch..  The left circumflex artery is patent. There is 2 obtuse marginal branches. The second obtuse marginal contains proximal eccentric 50-70% narrowing. The mid circumflex after the origin of the second obtuse marginal contains 50% narrowing..  The ramus intermedius is small and widely patent.  The right coronary artery is dominant and widely patent..   INNOMINATE ANGIOGRAPHY:  There is heavy calcification with at least 50% obstruction in the innominate artery before the large and   LEFT VENTRICULOGRAM:  Left ventricular angiogram was done in the 30 RAO projection and revealed normal LV cavity size with normal LV contractility and EF 65%..   IMPRESSIONS:  1. Mild to moderate diagonal, circumflex and obtuse marginal disease.  2. Normal left ventricular systolic function.  3. Borderline significant innominate artery stenosis.   RECOMMENDATION:  Upper extremity and carotid Doppler evaluation.  Radiology:  Dg Chest 2 View  Result Date: 07/27/2016 CLINICAL DATA:  Generalized chest pressure today.  Recent pneumonia. EXAM: CHEST  2 VIEW COMPARISON:  Chest radiograph July 07, 2016 FINDINGS: Cardiomediastinal silhouette is normal. Mildly calcified  aortic knob. Patchy RIGHT middle lobe airspace opacity. Linear densities project LEFT mid and lower lung zone. No pleural effusion. No pneumothorax. Soft tissue planes included osseous structures are nonsuspicious. IMPRESSION: Similar patchy RIGHT middle lobe and lingular/LEFT lung base atelectasis/scarring. Electronically Signed   By: Elon Alas M.D.   On: 07/27/2016 02:21   US Abdomen Limited Ruq  Result Date: 07/27/2016 CLINICAL DATA:  Right upper quadrant pain and chest pain, 4 hours duration. EXAM: US ABDOMEN LIMITED - RIGHT UPPER QUADRANT COMPARISON:  None. FINDINGS: Gallbladder: Multiple mobile calculi, measuring up to 1.2 cm. No gallbladder wall thickening or pericholecystic fluid. The patient was not tender over the gallbladder. Common bile duct: Diameter: 3 mm Liver: Mild coarsening of hepatic echotexture without discrete lesion. IMPRESSION: Cholelithiasis without sonographic evidence of acute cholecystitis. Electronically Signed   By: Andreas Newport M.D.   On: 07/27/2016 03:55    ASSESSMENT AND PLAN:     1. NSTEMI - His chest is typical and atypical symptoms. Chest pain resolved after GI cocktail in ER. Peak of troponin 1.6. Continue IV heparin. Will cycle troponin to see trend. EKG without acute changes. Currently chest pain-free. Keep nothing by mouth. We will discuss with M.D. regarding planned. Hx of moderate CAD. Likely cath tomorrow.   2. Chronic abdominal pain with cholelithiasis - Will defer further management per primary.  3. PAF - Maintaining sinus rhythm. CHADSVASc score of 5. Continue amiodarone and Eliquis for anticoagulation.  4. Bilateral carotid stenosis - Most recently evaluated in May 2017. Repeat study in one year.  5. Right subclavian stenosis -  Appears to be asymptomatic. Continue medical therapy. Followed by Dr. Fletcher Anon.  6. CKD stage II - Baseline creatinine around 1.4-1.5. Kidney function improved with IV hydration  7. Acute on chronic anemia with  pancytopenia - Hgb 8.4. History of GI bleed requiring blood transfusion. Stool guaiac negative this admission.   SignedLeanor Kail, PA 07/28/2016, 11:42 AM Pager [phone]  Co-Sign MD  Patient seen and examined. Agree with assessment and plan.  Michael [name] is a 76 year old Caucasian male who has a history of previously diagnosed nonobstructive CAD, mild to moderate bilateral carotid disease, and right subclavian artery stenosis.  Cardiac catheterization in April 2016 showed mild to moderate diagonal, circumflex and obtuse marginal disease.  On CT imaging.  He was found to have greater than 50% stenosis of the right subclavian artery.  He has been followed by Dr. Audelia Acton for his PVD.  He has been followed by Dr. Johnsie Cancel for his CAD.  On New Year's Eve, he developed epigastric and sternal chest pressure with left arm and neck radiation.  His blood pressure increased to 349-611 systolic and he developed increasing shortness of breath leading to EMS activation.  His ECG showed sinus rhythm at 60 bpm, nonspecific T changes.  The patient has a history of PAF.  It has been on eloquence anticoagulation.  His last dose of eloquence was 9:30 PM on 07/26/2016.  Since being hospitalized, his serum troponin has increased from 0.06 > 0.29 > 1.63.  A recent echo Doppler study from July 13, 2016 reveals an ejection fraction at 60-65% with grade 1 diastolic dysfunction.  There was moderate aortic stenosis with a mean gradient of 26 and a peak instantaneous gradient of 44 mmHg. There was mild left atrial dilatation and moderate pulmonary hypertension with estimated PA pressure at 52 mm.  On PE his BP is elevated at 150/73.  Pulse is in the 60s.  HEENT is notable for a Malinpatti scale of 3/4.  He has a thick neck.  He has carotid bruits versus transmitted aortic stenosis murmur.  Lungs are clear without wheezes.  Rhythm is regular.  There is a 2/6 AS murmur.  There is no S3 gallop.  There is mild central  adiposity.  Pulses are 2+.  There is no edema.  He has negative Homans sign.  Neurologic exam is grossly nonfocal.  He is ruled in for non-ST segment elevation MI.  His last dose of eliquis was on December 31,PM.  I have recommended a definitive evaluation with cardiac catheterization.  With his moderate aortic stenosis and pulmonary hypertension, I will schedule him for a right and left heart cardiac catheterization study.  With his AS and right subclavian stenosis would recommend a femoral approach.  I have reviewed the risks, indications, and alternatives to cardiac catheterization, possible angioplasty, and stenting with the patient. Risks include but are not limited to bleeding, infection, vascular injury, stroke, myocardial infection, arrhythmia, kidney injury, radiation-related injury in the case of prolonged fluoroscopy use, emergency cardiac surgery, and death. The patient understands the risks of serious complication is 1-2 in 6435 with diagnostic cardiac cath and 1-2% or less with angioplasty/stenting.  On further questioning, the patient also gives a history highly suggestive of obstructive sleep apnea.  According to his wife, the patient snores very loudly.  Remotely, he has had some witnessed apneic events, but none recently.  His sleep is nonrestorative.  He wakes up fatigued.  He has a history of PAF.  He ultimately will require a sleep study to investigate for obstructive sleep apnea which can be set up post discharge as an outpatient.  Troy Sine, MD, [facility] 07/28/2016 1:40 PM

## 2016-07-28 NOTE — Progress Notes (Signed)
[facility] for heparin (apixaban PTA) Indication: chest pain/ACS and atrial fibrillation  Allergies  Allergen Reactions  . Percocet [Oxycodone-Acetaminophen] Nausea And Vomiting  . Invokana [Canagliflozin]     Side effects  . Metformin And Related     Upset stomach    Patient Measurements: Height: 5\' 11"  (180.3 cm) Weight: 259 lb 4.8 oz (117.6 kg) IBW/kg (Calculated) : 75.3 Heparin Dosing Weight: 101 kg   Vital Signs: BP: 159/62 (01/02 1500) Pulse Rate: 58 (01/02 1500)  Labs:  Recent Labs  07/27/16 0156  07/27/16 0957  07/27/16 1641 07/28/16 0008 07/28/16 0258 07/28/16 1037 07/28/16 1417 07/28/16 1819  HGB 8.4*  --  11.8*  --   --   --  11.8*  --   --   --   HCT 22.7*  --  32.3*  --   --   --  33.8*  --   --   --   PLT 121*  --   --   --   --   --  129*  --   --   --   APTT  --   --   --   < > 33 47*  --  67*  --  65*  HEPARINUNFRC  --   --   --   --  0.52  --   --  0.51  --   --   CREATININE 1.74*  --   --   --   --   --  1.49*  --   --   --   TROPONINI  --   < > 0.29*  --  1.63*  --   --   --  1.88* 1.58*  < > = values in this interval not displayed.  Estimated Creatinine Clearance: 57.6 mL/min (by C-G formula based on SCr of 1.49 mg/dL (H)).   Medical History: Past Medical History:  Diagnosis Date  . Allergy   . Arthritis   . AVM (arteriovenous malformation) of colon   . CAD (coronary artery disease)    a. Cath 10/2014 - Mild to moderate diagonal, circumflex and obtuse marginal disease. Innominate artery sternosis.  . Carotid artery disease ([place])    a. Carotid dopple 03/2014 15-17% RICA &  61-60% LICA stenosis b. 7/37  . CKD (chronic kidney disease), stage III   . Clostridium difficile colitis 05/13/2016  . Dysrhythmia    ATRIAL FIBRILATION  . Elevated troponin    a. 09/2014 in setting of AF RVR-->Myoview: EF 49%, no ischemia/infarct->Med Rx.  . GERD (gastroesophageal reflux disease)   . Gout   . H/O transfusion of  whole blood   . History of PFTs    PFTs 6/16:  FVC 3.73 (87%), FEV1 2.93 (88%), FEV1/FVC 78%, DLCO 69%  . Hx of adenomatous colonic polyps 07/02/2016  . Hyperlipidemia   . Hypertension   . Hypothyroidism   . Iron deficiency anemia   . PAF (paroxysmal atrial fibrillation) ([place])    a. 09/2014: Converted on Dilt;  b. CHA2DS2VASc = 3-->eliquis;  c. 09/2014 Echo: EF 55-60%, mild LVH, mildly dil LA.  [name] Personal history of colonic polyps 2007, 2008   adenoma each time, largest 12 mm in 2007  . Psoriasis   . Type II diabetes mellitus ([facility])    TYPE 2    Assessment: 76 yo M continuing on IV heparin for r/o ACS, troponins trending up. On apixaban prior to admission for atrial fibrillation. Apixaban now on hold, with  last dose 12/31 PM. Heparin levels elevated due to apixaban exposure, so aPTTs will be used to monitor anticoagulation effects until heparin levels correlating. Hgb stable at 11.8 with noted chronic pancytopenia. No bleeding documented. Cardiology consulted with plans for possible cath 1/2.  APTT now slightly SUBtherapeutic (65) on heparin at 1800 units/h.  Goal of Therapy:  Heparin level 0.3-0.7 units/ml aPTT 66-102 seconds Monitor platelets by anticoagulation protocol: Yes   Plan:  Increase heparin infusion to 2000 units/hr aPTT and heparin level at 0200 1/3 Daily aPTT/heparin level/CBC Monitor for s/sx bleeding Apixaban on hold - F/u Cards plans - cath 1/3?  [employer], Pharm.D., BCPS Clinical Pharmacist Pager [phone] 07/28/2016 8:01 PM

## 2016-07-28 NOTE — Care Management Note (Addendum)
Case Management Note  Patient Details  Name: Michael [name] MRN: [id number] Date of Birth: [birth year]  Subjective/Objective:    Pt presented for chest pain, AKI and HTN. Pt is from home with wife. PTA pt independent. Has family support of daughter and son-n-law at the bedside. Plan will be to return home once stable.            Action/Plan: CM will continue to monitor for additional needs.  1/3 [place], BSN - s/p coronary stent, will be on plavix and asa.  NCM will cont to follow for dc needs.   1/4 1538 NCM made referral to [facility].   Expected Discharge Date:                  Expected Discharge Plan:  Home/Self Care  In-House Referral:     Discharge planning Services  CM Consult  Post Acute Care Choice:    Choice offered to:     DME Arranged:    DME Agency:     HH Arranged:    HH Agency:     Status of Service:  In process, will continue to follow  If discussed at Long Length of Stay Meetings, dates discussed:    Additional Comments:  Bethena Roys, RN 07/28/2016, 4:40 PM

## 2016-07-29 ENCOUNTER — Encounter (HOSPITAL_COMMUNITY)
Admission: EM | Disposition: A | Discharge status: Home Health | Payer: Self-pay | Source: Home / Self Care | Attending: Family Medicine

## 2016-07-29 DIAGNOSIS — I214 Non-ST elevation (NSTEMI) myocardial infarction: Secondary | ICD-10-CM

## 2016-07-29 HISTORY — PX: CARDIAC CATHETERIZATION: SHX172

## 2016-07-29 LAB — CBC
HEMATOCRIT: 34.9 % — AB (ref 39.0–52.0)
HEMOGLOBIN: 12.3 g/dL — AB (ref 13.0–17.0)
MCH: 30.1 pg (ref 26.0–34.0)
MCHC: 35.2 g/dL (ref 30.0–36.0)
MCV: 85.5 fL (ref 78.0–100.0)
Platelets: 141 10*3/uL — ABNORMAL LOW (ref 150–400)
RBC: 4.08 MIL/uL — AB (ref 4.22–5.81)
RDW: 15.7 % — ABNORMAL HIGH (ref 11.5–15.5)
WBC: 6 10*3/uL (ref 4.0–10.5)

## 2016-07-29 LAB — POCT I-STAT 3, VENOUS BLOOD GAS (G3P V)
Acid-base deficit: 1 mmol/L (ref 0.0–2.0)
Bicarbonate: 24.9 mmol/L (ref 20.0–28.0)
O2 Saturation: 58 %
PCO2 VEN: 43.5 mmHg — AB (ref 44.0–60.0)
PH VEN: 7.365 (ref 7.250–7.430)
TCO2: 26 mmol/L (ref 0–100)
pO2, Ven: 31 mmHg — CL (ref 32.0–45.0)

## 2016-07-29 LAB — BASIC METABOLIC PANEL
ANION GAP: 8 (ref 5–15)
BUN: 22 mg/dL — ABNORMAL HIGH (ref 6–20)
CO2: 25 mmol/L (ref 22–32)
Calcium: 8.9 mg/dL (ref 8.9–10.3)
Chloride: 102 mmol/L (ref 101–111)
Creatinine, Ser: 1.56 mg/dL — ABNORMAL HIGH (ref 0.61–1.24)
GFR, EST AFRICAN AMERICAN: 49 mL/min — AB (ref >60)
GFR, EST NON AFRICAN AMERICAN: 42 mL/min — AB (ref >60)
Glucose, Bld: 225 mg/dL — ABNORMAL HIGH (ref 65–99)
POTASSIUM: 4 mmol/L (ref 3.5–5.1)
SODIUM: 135 mmol/L (ref 135–145)

## 2016-07-29 LAB — POCT I-STAT 3, ART BLOOD GAS (G3+)
Acid-base deficit: 2 mmol/L (ref 0.0–2.0)
Bicarbonate: 22.8 mmol/L (ref 20.0–28.0)
O2 Saturation: 94 %
PCO2 ART: 38.7 mmHg (ref 32.0–48.0)
PH ART: 7.377 (ref 7.350–7.450)
TCO2: 24 mmol/L (ref 0–100)
pO2, Arterial: 74 mmHg — ABNORMAL LOW (ref 83.0–108.0)

## 2016-07-29 LAB — TROPONIN I: Troponin I: 1.78 ng/mL (ref <0.03)

## 2016-07-29 LAB — PROTIME-INR
INR: 1.01
PROTHROMBIN TIME: 13.3 s (ref 11.4–15.2)

## 2016-07-29 LAB — GLUCOSE, CAPILLARY
GLUCOSE-CAPILLARY: 318 mg/dL — AB (ref 65–99)
Glucose-Capillary: 261 mg/dL — ABNORMAL HIGH (ref 65–99)
Glucose-Capillary: 211 mg/dL — ABNORMAL HIGH (ref 65–99)

## 2016-07-29 LAB — POCT ACTIVATED CLOTTING TIME
ACTIVATED CLOTTING TIME: 417 s
ACTIVATED CLOTTING TIME: 142 s
Activated Clotting Time: 131 seconds

## 2016-07-29 LAB — APTT
APTT: 92 s — AB (ref 24–36)
aPTT: 56 seconds — ABNORMAL HIGH (ref 24–36)

## 2016-07-29 LAB — HEPARIN LEVEL (UNFRACTIONATED)
Heparin Unfractionated: 0.31 IU/mL (ref 0.30–0.70)
Heparin Unfractionated: 0.47 IU/mL (ref 0.30–0.70)

## 2016-07-29 SURGERY — RIGHT/LEFT HEART CATH AND CORONARY ANGIOGRAPHY
Anesthesia: LOCAL

## 2016-07-29 MED ORDER — HYDRALAZINE HCL 20 MG/ML IJ SOLN
INTRAMUSCULAR | Status: DC | PRN
  Administered 2016-07-29 (×2): 10 mg via INTRAVENOUS

## 2016-07-29 MED ORDER — FENTANYL CITRATE (PF) 100 MCG/2ML IJ SOLN
INTRAMUSCULAR | Status: DC | PRN
  Administered 2016-07-29: 50 ug via INTRAVENOUS
  Administered 2016-07-29 (×2): 25 ug via INTRAVENOUS

## 2016-07-29 MED ORDER — INSULIN ASPART 100 UNIT/ML ~~LOC~~ SOLN
0.0000 [IU] | Freq: Three times a day (TID) | SUBCUTANEOUS | Status: DC
  Administered 2016-07-30: 5 [IU] via SUBCUTANEOUS
  Administered 2016-07-30: 07:00:00 8 [IU] via SUBCUTANEOUS
  Administered 2016-07-30 – 2016-07-31 (×2): 3 [IU] via SUBCUTANEOUS
  Administered 2016-07-31 – 2016-08-01 (×3): 5 [IU] via SUBCUTANEOUS
  Administered 2016-08-01: 3 [IU] via SUBCUTANEOUS
  Administered 2016-08-01: 5 [IU] via SUBCUTANEOUS
  Administered 2016-08-02 (×2): 3 [IU] via SUBCUTANEOUS
  Administered 2016-08-02: 5 [IU] via SUBCUTANEOUS
  Administered 2016-08-03 – 2016-08-04 (×4): 3 [IU] via SUBCUTANEOUS

## 2016-07-29 MED ORDER — ASPIRIN 81 MG PO CHEW
81.0000 mg | CHEWABLE_TABLET | Freq: Every day | ORAL | Status: DC
  Administered 2016-07-30 – 2016-08-04 (×6): 81 mg via ORAL
  Filled 2016-07-29 (×6): qty 1

## 2016-07-29 MED ORDER — LIDOCAINE HCL (PF) 1 % IJ SOLN
INTRAMUSCULAR | Status: AC
  Filled 2016-07-29: qty 30

## 2016-07-29 MED ORDER — ANGIOPLASTY BOOK
Freq: Once | Status: AC
  Administered 2016-07-29: 21:00:00
  Filled 2016-07-29: qty 1

## 2016-07-29 MED ORDER — IOPAMIDOL (ISOVUE-370) INJECTION 76%
INTRAVENOUS | Status: AC
  Filled 2016-07-29: qty 50

## 2016-07-29 MED ORDER — NITROGLYCERIN 1 MG/10 ML FOR IR/CATH LAB
INTRA_ARTERIAL | Status: AC
  Filled 2016-07-29: qty 10

## 2016-07-29 MED ORDER — MORPHINE SULFATE (PF) 2 MG/ML IV SOLN
2.0000 mg | Freq: Once | INTRAVENOUS | Status: AC
  Administered 2016-07-29: 17:00:00 2 mg via INTRAVENOUS
  Filled 2016-07-29: qty 1

## 2016-07-29 MED ORDER — HEPARIN (PORCINE) IN NACL 100-0.45 UNIT/ML-% IJ SOLN
2200.0000 [IU]/h | INTRAMUSCULAR | Status: DC
  Administered 2016-07-30: 03:00:00 2200 [IU]/h via INTRAVENOUS
  Filled 2016-07-29: qty 250

## 2016-07-29 MED ORDER — BIVALIRUDIN 250 MG IV SOLR
INTRAVENOUS | Status: AC
  Filled 2016-07-29: qty 250

## 2016-07-29 MED ORDER — SODIUM CHLORIDE 0.9 % IV SOLN: 250.0000 mL | INTRAVENOUS | Status: DC | PRN

## 2016-07-29 MED ORDER — CLOPIDOGREL BISULFATE 300 MG PO TABS
ORAL_TABLET | ORAL | Status: AC
  Filled 2016-07-29: qty 2

## 2016-07-29 MED ORDER — FENTANYL CITRATE (PF) 100 MCG/2ML IJ SOLN
INTRAMUSCULAR | Status: AC
  Filled 2016-07-29: qty 2

## 2016-07-29 MED ORDER — LIDOCAINE HCL (PF) 1 % IJ SOLN
INTRAMUSCULAR | Status: DC | PRN
  Administered 2016-07-29: 12 mL

## 2016-07-29 MED ORDER — MIDAZOLAM HCL 2 MG/2ML IJ SOLN
INTRAMUSCULAR | Status: AC
  Filled 2016-07-29: qty 2

## 2016-07-29 MED ORDER — NITROGLYCERIN 1 MG/10 ML FOR IR/CATH LAB
INTRA_ARTERIAL | Status: DC | PRN
  Administered 2016-07-29: 200 ug via INTRACORONARY

## 2016-07-29 MED ORDER — BIVALIRUDIN 250 MG IV SOLR
INTRAVENOUS | Status: DC | PRN
  Administered 2016-07-29 (×2): 1.75 mg/kg/h via INTRAVENOUS

## 2016-07-29 MED ORDER — HEART ATTACK BOUNCING BOOK
Freq: Once | Status: AC
  Administered 2016-07-29: 21:00:00
  Filled 2016-07-29: qty 1

## 2016-07-29 MED ORDER — BIVALIRUDIN BOLUS VIA INFUSION - CUPID
INTRAVENOUS | Status: DC | PRN
  Administered 2016-07-29: 88.35 mg via INTRAVENOUS

## 2016-07-29 MED ORDER — SODIUM CHLORIDE 0.9 % IV SOLN
INTRAVENOUS | Status: AC
  Administered 2016-07-29: 14:00:00 via INTRAVENOUS

## 2016-07-29 MED ORDER — CLOPIDOGREL BISULFATE 300 MG PO TABS
ORAL_TABLET | ORAL | Status: DC | PRN
Start: 2016-07-29 — End: 2016-07-29
  Administered 2016-07-29: 600 mg via ORAL

## 2016-07-29 MED ORDER — LABETALOL HCL 5 MG/ML IV SOLN
10.0000 mg | INTRAVENOUS | Status: AC | PRN
  Administered 2016-07-29 (×4): 10 mg via INTRAVENOUS
  Filled 2016-07-29 (×4): qty 4

## 2016-07-29 MED ORDER — SODIUM CHLORIDE 0.9% FLUSH: 3.0000 mL | INTRAVENOUS | Status: DC | PRN

## 2016-07-29 MED ORDER — IOPAMIDOL (ISOVUE-370) INJECTION 76%
INTRAVENOUS | Status: DC | PRN
Start: 2016-07-29 — End: 2016-07-29
  Administered 2016-07-29: 175 mL via INTRA_ARTERIAL

## 2016-07-29 MED ORDER — HEPARIN (PORCINE) IN NACL 2-0.9 UNIT/ML-% IJ SOLN
INTRAMUSCULAR | Status: AC
  Filled 2016-07-29: qty 1000

## 2016-07-29 MED ORDER — HYDRALAZINE HCL 20 MG/ML IJ SOLN
INTRAMUSCULAR | Status: AC
  Filled 2016-07-29: qty 1

## 2016-07-29 MED ORDER — HEPARIN (PORCINE) IN NACL 2-0.9 UNIT/ML-% IJ SOLN
INTRAMUSCULAR | Status: DC | PRN
  Administered 2016-07-29: 1000 mL

## 2016-07-29 MED ORDER — CLOPIDOGREL BISULFATE 75 MG PO TABS
75.0000 mg | ORAL_TABLET | Freq: Every day | ORAL | Status: DC
  Administered 2016-07-30 – 2016-08-04 (×6): 75 mg via ORAL
  Filled 2016-07-29 (×6): qty 1

## 2016-07-29 MED ORDER — SODIUM CHLORIDE 0.9% FLUSH
3.0000 mL | Freq: Two times a day (BID) | INTRAVENOUS | Status: DC
  Administered 2016-07-31 – 2016-08-01 (×2): 3 mL via INTRAVENOUS

## 2016-07-29 MED ORDER — HYDRALAZINE HCL 20 MG/ML IJ SOLN
10.0000 mg | INTRAMUSCULAR | Status: AC | PRN
  Administered 2016-07-29 (×5): 10 mg via INTRAVENOUS
  Filled 2016-07-29 (×5): qty 1

## 2016-07-29 MED ORDER — INSULIN ASPART 100 UNIT/ML ~~LOC~~ SOLN
0.0000 [IU] | Freq: Every day | SUBCUTANEOUS | Status: DC
  Administered 2016-07-29: 23:00:00 4 [IU] via SUBCUTANEOUS

## 2016-07-29 MED ORDER — IOPAMIDOL (ISOVUE-370) INJECTION 76%
INTRAVENOUS | Status: AC
  Filled 2016-07-29: qty 100

## 2016-07-29 MED ORDER — MIDAZOLAM HCL 2 MG/2ML IJ SOLN
INTRAMUSCULAR | Status: DC | PRN
  Administered 2016-07-29 (×2): 1 mg via INTRAVENOUS

## 2016-07-29 SURGICAL SUPPLY — 26 items
BALLN ANGIOSCULPT RX 2.0X10 (BALLOONS) ×2
BALLN EUPHORA RX 2.0X12 (BALLOONS) ×2
BALLN ~~LOC~~ MOZEC 2.5X15 (BALLOONS) ×2
BALLN ~~LOC~~ MOZEC 2.5X28 (BALLOONS) ×2
BALLOON ANGIOSCULPT RX 2.0X10 (BALLOONS) IMPLANT
BALLOON EUPHORA RX 2.0X12 (BALLOONS) IMPLANT
BALLOON ~~LOC~~ MOZEC 2.5X15 (BALLOONS) IMPLANT
BALLOON ~~LOC~~ MOZEC 2.5X28 (BALLOONS) IMPLANT
CATH EXPO 5F FL5 (CATHETERS) ×1 IMPLANT
CATH INFINITI 5FR MULTPACK ANG (CATHETERS) ×1 IMPLANT
CATH SWAN GANZ 7F STRAIGHT (CATHETERS) ×1 IMPLANT
CATH VISTA GUIDE 6FR XB3.5 (CATHETERS) ×1 IMPLANT
KIT ENCORE 26 ADVANTAGE (KITS) ×1 IMPLANT
KIT HEART LEFT (KITS) ×2 IMPLANT
PACK CARDIAC CATHETERIZATION (CUSTOM PROCEDURE TRAY) ×2 IMPLANT
SET INTRODUCER MICROPUNCT 5F (INTRODUCER) ×1 IMPLANT
SHEATH PINNACLE 5F 10CM (SHEATH) ×1 IMPLANT
SHEATH PINNACLE 6F 10CM (SHEATH) ×1 IMPLANT
SHEATH PINNACLE 7F 10CM (SHEATH) ×1 IMPLANT
STENT SYNERGY DES 2.25X38 (Permanent Stent) ×1 IMPLANT
TRANSDUCER W/STOPCOCK (MISCELLANEOUS) ×3 IMPLANT
TUBING CIL FLEX 10 FLL-RA (TUBING) ×2 IMPLANT
WIRE EMERALD 3MM-J .025X260CM (WIRE) ×1 IMPLANT
WIRE EMERALD 3MM-J .035X150CM (WIRE) ×1 IMPLANT
WIRE HI TORQ BMW 190CM (WIRE) ×1 IMPLANT
WIRE RUNTHROUGH .014X180CM (WIRE) ×1 IMPLANT

## 2016-07-29 NOTE — Progress Notes (Signed)
Patient received from cath lab at 1400.  Per report, left wrist had IV infiltrate prior to arrival to cath lab.  Area very tender to palp, red, warm.  Per report, Dr. Saunders Revel aware and examined.  Patient given comfort care for this, including  Elevation of arm, medication, and ice.  Wrist still described as a significant burning sensation. Dr. Eliseo Squires informed.

## 2016-07-29 NOTE — Progress Notes (Signed)
Inpatient Diabetes Program Recommendations  AACE/ADA: New Consensus Statement on Inpatient Glycemic Control (2015)  Target Ranges:  Prepandial:   less than 140 mg/dL      Peak postprandial:   less than 180 mg/dL (1-2 hours)      Critically ill patients:  140 - 180 mg/dL   Lab Results  Component Value Date   GLUCAP 261 (H) 07/29/2016   HGBA1C 7.1 (H) 07/27/2016    Review of Glycemic Control Results for Michael [name], Michael [name] (MRN [id number]) as of 07/29/2016 07:40  Ref. Range 07/28/2016 08:31 07/28/2016 11:11 07/28/2016 16:20 07/28/2016 21:28 07/29/2016 06:20  Glucose-Capillary Latest Ref Range: 65 - 99 mg/dL 233 (H) 241 (H) 255 (H) 264 (H) 261 (H)   Inpatient Diabetes Program Recommendations:  Noted NPO today for procedure. CBGs elevated 233-261 over the past 24 hrs. Please consider: -Increase Lantus to 52 units daily -Increase Novolog correction to moderate 0-15 q 4 hrs -Add Novolog meal coverage as needed when eating post procedure  Thank you, Bethena Roys E. Krisi Azua, RN, MSN, CDE Inpatient Glycemic Control Team Team Pager [phone] (8am-5pm) 07/29/2016 7:42 AM

## 2016-07-29 NOTE — H&P (View-Only) (Signed)
CARDIOLOGY CONSULT NOTE   Patient ID: Michael [name] MRN: [id number] DOB/AGE: October 09, 1940 76 y.o.  Admit date: 07/27/2016  Primary Physician   [name], MD Primary Cardiologist   Dr. Johnsie Cancel PV: Dr. Fletcher Anon Reason for Consultation   NSTEMI Requesting Physician  Dr. Eliseo Squires  HPI: Michael [name] is a 76 y.o. male with a history of non obstructive CAD, PAF on eliquis, prior hx of GI bleed due to AVM requiring blood transfusion, , CKD stage III, HTN, HLD, IDA, DM, bilateral carotid stenosis and right subclavian artery stenosis who came by EMS for chest pain and dyspnea 07/27/16.   He had cardiac catheterization in April of 2016 which demonstrated mild to moderate diagonal, circumflex and OM disease, normal LV function and borderline significant innominate artery stenosis.  The catheterization was done via the right radial artery in the patient was noted to have calcified stenosis involving the right innominate artery. The patient underwent carotid US and upper extremity arterial duplex. He has bilateral ICA stenosis 40-59%. There was evidence of more proximal obstruction in the right upper extremity based upon waveform dampening.  The patient went on to have a CT angiogram which showed  Greater than 50% stenosis of the right subclavian artery.  The patient has known history of psoriasis arthritis affecting his hands, wrists and shoulders. He has significant contraction in his right wrist. His symptoms improved significantly with Humira.   Last seen by Dr. Fletcher Anon 06/16/16. Right subclavian stenosis was felt to be mostly asymptomatic and recommended medical therapy. Complain of exertional dyspnea since hospitalization for pneumonia in Aug 2017. Noted systolic murmur. F/u echo showed 06/2016 showed  normal EF with moderate aortic stenosis. There was moderate pulmonary hypertension. Increased lasix to 40 mg once daily.  Seen in usual state of health up until midnight Sunday. He was laying on a bed and had a  sudden onset epigastric/lower sternal pressure that radiated to his left shoulder. He also had a nausea felt like "burping sensation". Noted blood pressure of 195/104. He also had a severe shortness of breath leading to EMS activation. No improvement of sublingual nitroglycerin x 2. Pain resolved after Zofran and GI cocktail in ER. Patient also complains of chronic abdominal pain for the past 3 months. This has been somewhat similar to when he had a GI bleed in 2012.  EKG shows normal sinus rhythm with nonspecific T-wave inversion in lateral lead which appears similar to prior EKGs. BNP normal. Troponin I was 0.06 which trended up to 1.63. On IV heparin. Hemoglobin A1c 7.1. Blood sugar has been running high. No chest pain since admission. Hemoglobin 8.4 with pancytopenia. Lipase normal. Abdominal ultrasound shows cholelithiasis without evidence of acute cholecystitis.  Cardiology asked for further management of non-STEMI. The patient denies any palpitation, lower extremity edema, orthopnea, PND, syncope.   Past Medical History:  Diagnosis Date  . Allergy   . Arthritis   . AVM (arteriovenous malformation) of colon   . CAD (coronary artery disease)    a. Cath 10/2014 - Mild to moderate diagonal, circumflex and obtuse marginal disease. Innominate artery sternosis.  . Carotid artery disease ([place])    a. Carotid dopple 03/2014 40-98% RICA &  11-91% LICA stenosis b. 4/78  . CKD (chronic kidney disease), stage III   . Clostridium difficile colitis 05/13/2016  . Dysrhythmia    ATRIAL FIBRILATION  . Elevated troponin    a. 09/2014 in setting of AF RVR-->Myoview: EF 49%, no ischemia/infarct->Med Rx.  . GERD (gastroesophageal reflux  disease)   . Gout   . H/O transfusion of whole blood   . History of PFTs    PFTs 6/16:  FVC 3.73 (87%), FEV1 2.93 (88%), FEV1/FVC 78%, DLCO 69%  . Hx of adenomatous colonic polyps 07/02/2016  . Hyperlipidemia   . Hypertension   . Hypothyroidism   . Iron deficiency anemia     . PAF (paroxysmal atrial fibrillation) ([place])    a. 09/2014: Converted on Dilt;  b. CHA2DS2VASc = 3-->eliquis;  c. 09/2014 Echo: EF 55-60%, mild LVH, mildly dil LA.  [name] Personal history of colonic polyps 2007, 2008   adenoma each time, largest 12 mm in 2007  . Psoriasis   . Type II diabetes mellitus ([place])    TYPE 2     Past Surgical History:  Procedure Laterality Date  . APPENDECTOMY  02/09/2014  . COLONOSCOPY  02/10/2011   internal hemorrhoids  . COLONOSCOPY W/ POLYPECTOMY  04/09/2006   12 mm adenoma  . COLONOSCOPY W/ POLYPECTOMY  06/17/2007   5 mm adenoma  . COLONOSCOPY WITH PROPOFOL N/A 05/13/2016   Procedure: COLONOSCOPY WITH PROPOFOL;  Surgeon: Manus Gunning, MD;  Location: [facility];  Service: Gastroenterology;  Laterality: N/A;  . ESOPHAGOGASTRODUODENOSCOPY  12/23/2011   Procedure: ESOPHAGOGASTRODUODENOSCOPY (EGD);  Surgeon: Irene Shipper, MD;  Location: [facility];  Service: Endoscopy;  Laterality: N/A;  with small bowel bx's  . GIVENS CAPSULE STUDY  12/28/2011  . KNEE ARTHROSCOPY Right   . LAPAROSCOPIC APPENDECTOMY N/A 02/09/2014   Procedure: APPENDECTOMY LAPAROSCOPIC;  Surgeon: Leighton Ruff, MD;  Location: [facility] ORS;  Service: General;  Laterality: N/A;  . LEFT HEART CATHETERIZATION WITH CORONARY ANGIOGRAM N/A 11/15/2014   Procedure: LEFT HEART CATHETERIZATION WITH CORONARY ANGIOGRAM;  Surgeon: Belva Crome, MD;  Location: [facility] CATH LAB;  Service: Cardiovascular;  Laterality: N/A;  . ROTATOR CUFF REPAIR     left    Allergies  Allergen Reactions  . Percocet [Oxycodone-Acetaminophen] Nausea And Vomiting  . Invokana [Canagliflozin]     Side effects  . Metformin And Related     Upset stomach    I have reviewed the patient's current medications . allopurinol  200 mg Oral Daily  . amiodarone  200 mg Oral Daily  . atorvastatin  40 mg Oral q1800  . diltiazem  180 mg Oral Daily  . fenofibrate  160 mg Oral Daily  . insulin aspart  0-15 Units Subcutaneous Q4H  .  insulin glargine  50 Units Subcutaneous Daily  . levothyroxine  150 mcg Oral QAC breakfast  . metoprolol  50 mg Oral BID  . pantoprazole  40 mg Intravenous Q12H   . heparin 1,800 Units/hr (07/28/16 0205)   acetaminophen, hydrALAZINE, HYDROcodone-acetaminophen, meclizine, nitroGLYCERIN, ondansetron (ZOFRAN) IV  Prior to Admission medications   Medication Sig Start Date End Date Taking? Authorizing Provider  acetaminophen (TYLENOL) 650 MG CR tablet Take 1,300 mg by mouth every 8 (eight) hours as needed for pain.    Yes Historical Provider, MD  allopurinol (ZYLOPRIM) 100 MG tablet Take 2 tablets (200 mg total) by mouth daily. 02/03/16  Yes Evie Lacks [name], MD  amiodarone (PACERONE) 200 MG tablet TAKE 1 TABLET BY MOUTH DAILY 06/24/16  Yes Wellington Hampshire, MD  apixaban (ELIQUIS) 5 MG TABS tablet Take 1 tablet (5 mg total) by mouth 2 (two) times daily. 06/02/16  Yes Wellington Hampshire, MD  atorvastatin (LIPITOR) 40 MG tablet Take 1 tablet (40 mg total) by mouth daily. 03/09/16  Yes Josue Hector,  MD  Cholecalciferol 1000 UNITS TBDP Take 1,000-5,000 Units by mouth dail   y. Takes 1000 units everyday except for on Saturday patient takes 5000 units   Yes Historical Provider, MD  diltiazem (CARDIZEM CD) 180 MG 24 hr capsule Take 1 capsule (180 mg total) by mouth daily. 12/16/15  Yes Wellington Hampshire, MD  fenofibrate 160 MG tablet TAKE 1 TABLET(160 MG) BY MOUTH DAILY 08/09/15  Yes Darlin Coco, MD  ferrous sulfate 325 (65 FE) MG tablet Take 325 mg by mouth 3 (three) times daily with meals.   Yes Historical Provider, MD  furosemide (LASIX) 40 MG tablet Take 1 tablet (40 mg total) by mouth daily. 07/22/16  Yes Wellington Hampshire, MD  glipiZIDE (GLUCOTROL) 10 MG tablet Take 10 mg by mouth daily. 05/06/16  Yes Historical Provider, MD  HUMIRA PEN 40 MG/0.8ML PNKT Inject 40 mg into the skin every 14 (fourteen) days. 06/18/15  Yes Historical Provider, MD  HYDROcodone-acetaminophen (NORCO/VICODIN) 5-325 MG  tablet Take 1 tablet by mouth 2 (two) times daily as needed for moderate pain or severe pain. 12/16/15  Yes Evie Lacks [name], MD  Insulin Glargine (TOUJEO SOLOSTAR) 300 UNIT/ML SOPN Inject 50 Units into the skin daily. Titrate up by 1 unit every 1-2 days for goal sugars of 120-150 Patient taking differently: Inject 52 Units into the skin daily.  04/17/16 04/17/20 Yes Evie Lacks [name], MD  levothyroxine (SYNTHROID, LEVOTHROID) 150 MCG tablet TAKE 1 TABLET BY MOUTH DAILY BEFORE BREAKFAST 03/16/16  Yes Cassandria Anger, MD  meclizine (ANTIVERT) 12.5 MG tablet Take 1 tablet (12.5 mg total) by mouth 2 (two) times daily as needed for dizziness (do not use for more than 3days). 07/07/16  Yes Charlene Brooke Nche, NP  metoprolol (LOPRESSOR) 50 MG tablet TAKE 1 TABLET(50 MG) BY MOUTH TWICE DAILY 06/25/16  Yes Wellington Hampshire, MD  omeprazole (PRILOSEC) 40 MG capsule TAKE 1 CAPSULE BY MOUTH EVERY DAY 05/04/16  Yes Evie Lacks [name], MD  valsartan (DIOVAN) 160 MG tablet Take 1 tablet (160 mg total) by mouth daily. 06/05/16  Yes Tanda Rockers, MD  Blood Glucose Monitoring Suppl (ONE TOUCH ULTRA 2) W/DEVICE KIT Use as directed Dx E11.9 05/18/14   Evie Lacks [name], MD  Blood Pressure Monitor KIT Use to check blood pressure daily Dx I10 10/04/15   Evie Lacks [name], MD  glucose blood (ONE TOUCH ULTRA TEST) test strip 1 each by Other route 4 (four) times daily. Use to check blood sugar four times a day  Dx: E11.9- insulin Dependant 04/06/16   Evie Lacks [name], MD  Insulin Pen Needle 31G X 5 MM MISC 1 Units by Does not apply route 4 (four) times daily. 03/17/16   Evie Lacks [name], MD  Lancets Glory Rosebush ULTRASOFT) lancets Use to check blood sugars daily 05/18/14   Cassandria Anger, MD     Social History   Social History  . Marital status: Married    Spouse name: N/A  . Number of children: N/A  . Years of education: 26   Occupational History  .  Retired    Social History Main Topics  .  Smoking status: Former Smoker    Packs/day: 0.70    Years: 48.00    Types: Cigarettes    Quit date: 03/29/2006  . Smokeless tobacco: Never Used  . Alcohol use 0.6 oz/week    1 Cans of beer per week     Comment: occasional alcohol intake  . Drug use: No  .  Sexual activity: Not Currently   Other Topics Concern  . Not on file   Social History Narrative   Regular exercise-no   Caffeine Use-yes    Family Status  Relation Status  . Father Deceased  . Mother Deceased  . Brother Deceased  . Brother Alive  . Maternal Grandmother Deceased  . Maternal Grandfather Deceased  . Paternal Grandmother Deceased  . Paternal Grandfather Deceased  . Sister   . Neg Hx    Family History  Problem Relation Age of Onset  . Heart attack Father   . Lung cancer Father   . Diabetes Father   . Stroke Father   . Hypertension Father   . Stroke Mother   . Hypertension Mother   . Cancer Brother     liver  . Heart disease Brother     chf  . COPD Sister   . Malignant hyperthermia Neg Hx      ROS:  Full 14 point review of systems complete and found to be negative unless listed above.  Physical Exam: Blood pressure (!) 150/73, pulse 61, temperature 98.2 F (36.8 C), temperature source Oral, resp. rate 20, height 5' 11" (1.803 m), weight 259 lb 4.8 oz (117.6 kg), SpO2 99 %.  General: Well developed, well nourished, male in no acute distress Head: Eyes PERRLA, No xanthomas. Normocephalic and atraumatic, oropharynx without edema or exudate.  Lungs: Resp regular and unlabored, CTA. Heart: RRR no s3, s4. Systolic murmurs..   Neck: No carotid bruits. No lymphadenopathy.  No JVD. Abdomen: Bowel sounds present, abdomen soft and non-tender without masses or hernias noted. Msk:  No spine or cva tenderness. No weakness, no joint deformities or effusions. Extremities: No clubbing, cyanosis or edema. DP/PT/Radials 2+ and equal bilaterally. Neuro: Alert and oriented X 3. No focal deficits noted. Psych:  Good  affect, responds appropriately Skin: No rashes or lesions noted.  Labs:   Lab Results  Component Value Date   WBC 5.4 07/28/2016   HGB 11.8 (L) 07/28/2016   HCT 33.8 (L) 07/28/2016   MCV 84.5 07/28/2016   PLT 129 (L) 07/28/2016   No results for input(s): INR in the last 72 hours.  Recent Labs Lab 07/27/16 0156  07/28/16 0258  NA 136  --  134*  K 5.2*  < > 4.0  CL 105  --  102  CO2 19*  --  25  BUN 26*  --  22*  CREATININE 1.74*  --  1.49*  CALCIUM 8.3*  --  8.8*  PROT 5.9*  --   --   BILITOT 1.4*  --   --   ALKPHOS 33*  --   --   ALT 22  --   --   AST 60*  --   --   GLUCOSE 449*  --  205*  ALBUMIN 3.5  --   --   < > = values in this interval not displayed. Magnesium  Date Value Ref Range Status  10/11/2014 2.0 1.5 - 2.5 mg/dL Final    Comment:    POST-ULTRACENTRIFUGATION HEMOLYZED SPECIMEN, RESULTS MAY BE AFFECTED     Recent Labs  07/27/16 0536 07/27/16 0957 07/27/16 1641  TROPONINI 0.06* 0.29* 1.63*    Recent Labs  07/27/16 0205 07/27/16 0518  TROPIPOC 0.03 0.09*   No results found for: PROBNP Lab Results  Component Value Date   CHOL 100 03/20/2015   HDL 22.30 (L) 03/20/2015   LDLCALC UNABLE TO CALCULATE IF TRIGLYCERIDE OVER 400 mg/dL 10/12/2014  TRIG (H) 03/20/2015    795.0 Triglyceride is over 400; calculations on Lipids are invalid.   Lab Results  Component Value Date   DDIMER <0.19 05/08/2016   Lipase  Date/Time Value Ref Range Status  07/27/2016 01:56 AM 43 11 - 51 U/L Final   TSH  Date/Time Value Ref Range Status  07/27/2016 05:36 AM 4.452 0.350 - 4.500 uIU/mL Final    Comment:    Performed by a 3rd Generation assay with a functional sensitivity of <=0.01 uIU/mL.  09/17/2015 03:33 PM 1.89 0.35 - 4.50 uIU/mL Final   Vitamin B-12  Date/Time Value Ref Range Status  11/07/2013 07:54 AM 600 211 - 911 pg/mL Final   Folate  Date/Time Value Ref Range Status  12/22/2011 05:00 AM 10.3 ng/mL Final    Comment:    (NOTE) Reference  Ranges        Deficient:       0.4 - 3.3 ng/mL        Indeterminate:   3.4 - 5.4 ng/mL        Normal:              > 5.4 ng/mL   Ferritin  Date/Time Value Ref Range Status  07/27/2016 09:57 AM 456 (H) 24 - 336 ng/mL Final    Comment:    POST-ULTRACENTRIFUGATION  01/16/2014 09:29 AM 496 (H) 22 - 316 ng/ml Final   TIBC  Date/Time Value Ref Range Status  07/27/2016 09:57 AM 371 250 - 450 ug/dL Final  01/16/2014 09:29 AM 281 202 - 409 ug/dL Final   Iron  Date/Time Value Ref Range Status  07/27/2016 09:57 AM 62 45 - 182 ug/dL Final    Comment:    POST-ULTRACENTRIFUGATION  01/16/2014 09:29 AM 135 42 - 163 ug/dL Final   Retic Ct Pct  Date/Time Value Ref Range Status  12/22/2011 05:02 AM 3.7 (H) 0.4 - 3.1 % Final    Echo: 07/14/16  LV EF: 60% -   65%  ------------------------------------------------------------------- Indications:      SOB (R06.00).  ------------------------------------------------------------------- History:   Risk factors:  Hypertension. Diabetes mellitus. Dyslipidemia.  ------------------------------------------------------------------- Study Conclusions  - Left ventricle: The cavity size was normal. There was moderate   concentric hypertrophy. Systolic function was normal. The   estimated ejection fraction was in the range of 60% to 65%. Wall   motion was normal; there were no regional wall motion   abnormalities. Doppler parameters are consistent with abnormal   left ventricular relaxation (grade 1 diastolic dysfunction).   Doppler parameters are consistent with indeterminate ventricular   filling pressure. - Aortic valve: Trileaflet; mildly thickened, mildly calcified   leaflets. Valve mobility was restricted. There was moderate   stenosis. There was no regurgitation. Peak velocity (S): 332   cm/s. Mean gradient (S): 26 mm Hg. Valve area (VTI): 1.33 cm^2.   Valve area (Vmax): 1.32 cm^2. Valve area (Vmean): 1.22 cm^2. - Mitral valve:  Transvalvular velocity was within the normal range.   There was no evidence for stenosis. There was no regurgitation. - Left atrium: The atrium was mildly dilated. - Right ventricle: The cavity size was normal. Wall thickness was   normal. Systolic function was normal. - Atrial septum: No defect or patent foramen ovale was identified. - Tricuspid valve: There was mild regurgitation. - Pulmonary arteries: Systolic pressure was moderately increased.   PA peak pressure: 52 mm Hg (S).  Cath: 11/15/14 ANGIOGRAPHIC DATA:   The left main coronary artery is widely patent.  The left  anterior descending artery is widely patent and wraps around the left ventricular apex. The first diagonal contains 50-70% narrowing in its dominant branch..  The left circumflex artery is patent. There is 2 obtuse marginal branches. The second obtuse marginal contains proximal eccentric 50-70% narrowing. The mid circumflex after the origin of the second obtuse marginal contains 50% narrowing..  The ramus intermedius is small and widely patent.  The right coronary artery is dominant and widely patent..   INNOMINATE ANGIOGRAPHY:  There is heavy calcification with at least 50% obstruction in the innominate artery before the large and   LEFT VENTRICULOGRAM:  Left ventricular angiogram was done in the 30 RAO projection and revealed normal LV cavity size with normal LV contractility and EF 65%..   IMPRESSIONS:  1. Mild to moderate diagonal, circumflex and obtuse marginal disease.  2. Normal left ventricular systolic function.  3. Borderline significant innominate artery stenosis.   RECOMMENDATION:  Upper extremity and carotid Doppler evaluation.  Radiology:  Dg Chest 2 View  Result Date: 07/27/2016 CLINICAL DATA:  Generalized chest pressure today.  Recent pneumonia. EXAM: CHEST  2 VIEW COMPARISON:  Chest radiograph July 07, 2016 FINDINGS: Cardiomediastinal silhouette is normal. Mildly calcified  aortic knob. Patchy RIGHT middle lobe airspace opacity. Linear densities project LEFT mid and lower lung zone. No pleural effusion. No pneumothorax. Soft tissue planes included osseous structures are nonsuspicious. IMPRESSION: Similar patchy RIGHT middle lobe and lingular/LEFT lung base atelectasis/scarring. Electronically Signed   By: Elon Alas M.D.   On: 07/27/2016 02:21   US Abdomen Limited Ruq  Result Date: 07/27/2016 CLINICAL DATA:  Right upper quadrant pain and chest pain, 4 hours duration. EXAM: US ABDOMEN LIMITED - RIGHT UPPER QUADRANT COMPARISON:  None. FINDINGS: Gallbladder: Multiple mobile calculi, measuring up to 1.2 cm. No gallbladder wall thickening or pericholecystic fluid. The patient was not tender over the gallbladder. Common bile duct: Diameter: 3 mm Liver: Mild coarsening of hepatic echotexture without discrete lesion. IMPRESSION: Cholelithiasis without sonographic evidence of acute cholecystitis. Electronically Signed   By: Andreas Newport M.D.   On: 07/27/2016 03:55    ASSESSMENT AND PLAN:     1. NSTEMI - His chest is typical and atypical symptoms. Chest pain resolved after GI cocktail in ER. Peak of troponin 1.6. Continue IV heparin. Will cycle troponin to see trend. EKG without acute changes. Currently chest pain-free. Keep nothing by mouth. We will discuss with M.D. regarding planned. Hx of moderate CAD. Likely cath tomorrow.   2. Chronic abdominal pain with cholelithiasis - Will defer further management per primary.  3. PAF - Maintaining sinus rhythm. CHADSVASc score of 5. Continue amiodarone and Eliquis for anticoagulation.  4. Bilateral carotid stenosis - Most recently evaluated in May 2017. Repeat study in one year.  5. Right subclavian stenosis -  Appears to be asymptomatic. Continue medical therapy. Followed by Dr. Fletcher Anon.  6. CKD stage II - Baseline creatinine around 1.4-1.5. Kidney function improved with IV hydration  7. Acute on chronic anemia with  pancytopenia - Hgb 8.4. History of GI bleed requiring blood transfusion. Stool guaiac negative this admission.   SignedLeanor Kail, PA 07/28/2016, 11:42 AM Pager [phone]  Co-Sign MD  Patient seen and examined. Agree with assessment and plan.  Michael [name] is a 76 year old Caucasian male who has a history of previously diagnosed nonobstructive CAD, mild to moderate bilateral carotid disease, and right subclavian artery stenosis.  Cardiac catheterization in April 2016 showed mild to moderate diagonal, circumflex and obtuse marginal disease.  On CT imaging.  He was found to have greater than 50% stenosis of the right subclavian artery.  He has been followed by Dr. Audelia Acton for his PVD.  He has been followed by Dr. Johnsie Cancel for his CAD.  On New Year's Eve, he developed epigastric and sternal chest pressure with left arm and neck radiation.  His blood pressure increased to 349-611 systolic and he developed increasing shortness of breath leading to EMS activation.  His ECG showed sinus rhythm at 60 bpm, nonspecific T changes.  The patient has a history of PAF.  It has been on eloquence anticoagulation.  His last dose of eloquence was 9:30 PM on 07/26/2016.  Since being hospitalized, his serum troponin has increased from 0.06 > 0.29 > 1.63.  A recent echo Doppler study from July 13, 2016 reveals an ejection fraction at 60-65% with grade 1 diastolic dysfunction.  There was moderate aortic stenosis with a mean gradient of 26 and a peak instantaneous gradient of 44 mmHg. There was mild left atrial dilatation and moderate pulmonary hypertension with estimated PA pressure at 52 mm.  On PE his BP is elevated at 150/73.  Pulse is in the 60s.  HEENT is notable for a Malinpatti scale of 3/4.  He has a thick neck.  He has carotid bruits versus transmitted aortic stenosis murmur.  Lungs are clear without wheezes.  Rhythm is regular.  There is a 2/6 AS murmur.  There is no S3 gallop.  There is mild central  adiposity.  Pulses are 2+.  There is no edema.  He has negative Homans sign.  Neurologic exam is grossly nonfocal.  He is ruled in for non-ST segment elevation MI.  His last dose of eliquis was on December 31,PM.  I have recommended a definitive evaluation with cardiac catheterization.  With his moderate aortic stenosis and pulmonary hypertension, I will schedule him for a right and left heart cardiac catheterization study.  With his AS and right subclavian stenosis would recommend a femoral approach.  I have reviewed the risks, indications, and alternatives to cardiac catheterization, possible angioplasty, and stenting with the patient. Risks include but are not limited to bleeding, infection, vascular injury, stroke, myocardial infection, arrhythmia, kidney injury, radiation-related injury in the case of prolonged fluoroscopy use, emergency cardiac surgery, and death. The patient understands the risks of serious complication is 1-2 in 6435 with diagnostic cardiac cath and 1-2% or less with angioplasty/stenting.  On further questioning, the patient also gives a history highly suggestive of obstructive sleep apnea.  According to his wife, the patient snores very loudly.  Remotely, he has had some witnessed apneic events, but none recently.  His sleep is nonrestorative.  He wakes up fatigued.  He has a history of PAF.  He ultimately will require a sleep study to investigate for obstructive sleep apnea which can be set up post discharge as an outpatient.  Troy Sine, MD, [facility] 07/28/2016 1:40 PM

## 2016-07-29 NOTE — Progress Notes (Signed)
PROGRESS NOTE    Michael [name]  HBZ:[id number] DOB: October 28, 1940 DOA: 07/27/2016 PCP: [name], MD   Outpatient Specialists:     Brief Narrative:  Michael [name] is a 76 y.o. male with medical history significant for insulin-dependent diabetes mellitus, hypertension, coronary artery disease, chronic kidney disease stage III, paroxysmal atrial fibrillation on Eliquis, and reported small bowel AVMs who presents to the emergency department with chest pain and dyspnea. Patient reports that he had been in his usual state of health aside from some loose stools, but shortly after lying down to sleep tonight, developed acute onset of pressure sensation in the mid to lower chest with acute dyspnea. There was radiation of the pain down the left arm, but no diaphoresis or nausea. Patient also describes a general malaise and vague sense of unwellness. He did not attempt any interventions prior to coming in for evaluation. He denies any recent fevers or chills and has not had a cough. He denies any significant extremity edema and denies palpitations. He denies any vomiting, melena, or hematochezia.  ED Course: Upon arrival to the ED, patient is found to be afebrile, saturating adequately on room air, and with vitals otherwise stable. EKG featured sinus rhythm with nonspecific interventricular conduction delay. Chest x-ray features some patchy right middle lobe and lingular atelectasis or scarring. Chemistry panel was notable for a potassium of 5.2, BUN of 26, and serum creatinine 1.74, up from an apparent baseline of 1.4. Glucose is elevated to a value of 449. CBC features of pancytopenia with stable leukopenia of 2800, stable thrombocytopenia with platelets 121,000, and a hemoglobin of 8.4, down from the 10 range when last checked in October. Patient was given a 324 mg aspirin in the emergency department, a GI cocktail, Zofran, and 1 L of normal saline. There was concern for a possible GI bleed and he was  started on IV Protonix 40 mg IV. Patient has had no respiratory distress in the ED and has remained hemodynamically stable. There is been no recurrence of his chest pain. He will be observed on the telemetry unit for ongoing evaluation and management of chest pain concerning for possible ACS, as well as acute on chronic kidney disease and acute on chronic anemia concerning for possible GI bleeding.   Assessment & Plan:   Principal Problem:   Chest pain Active Problems:   Pancytopenia (HCC)   DM (diabetes mellitus), type 2 with peripheral vascular complications (HCC)   PAF (paroxysmal atrial fibrillation) (HCC)   Essential hypertension   Cholelithiasis   Hypothyroidism   Acute renal failure superimposed on stage 3 chronic kidney disease (HCC)   Hyperkalemia   GIB (gastrointestinal bleeding)   Chronic diastolic CHF (congestive heart failure) (HCC)   Moderate aortic stenosis   Chest pain-- patient describes more of a RUQ pain to me - Pt with hx of CAD - He was treated with ASA 324 mg, GI cocktail, and Zofran in ED with resolution of pain - troponin trending up-- cardiology consult-- for cath 1/3 -heparin started - Monitor on telemetry for ischemic changes  DM -SSI- adjust to moderate -lantus- will increase after prcedure -HgbA1c: 7.1-- improved from baseline  Acute on chronic anemia; pancytopenia  - suspect Hgb of 8 was lab error-- repeat is 11  Acute kidney injury superimposed on CKD stage III  - SCr is 1.74 on admission, up from apparent baseline of 1.4  - Suspect a prerenal azotemia in setting of recent loose stools  - BMP daily  Atrial fibrillation, paroxysmal - In a sinus rhythm on admission - CHADS-VASc 5 (age, CHF, CAD, HTN, DM)- resume blood thinner at d/c - Continue amiodarone as tolerated  Hypertension  - Hypertensive in ED and treated with Lopressor in setting of chest pain  - Lasix and valsartan held as above given bump in SCr - Continue Diltiazem,  Lopressorwith PRN hydralazine  Chronic diastolic CHF - Pt appears roughly euvolemic on admission  - TTE (07/14/16) with EF 12-45%, grade 1 diastolic dysfunction, mod AS, mild LAE, and mild TR   Cholelithiasis  - Pt is tender in RUQ with cholelithiasis noted on [name] without evidence for acute cholecystitis  - HIDA as outpatient  Hypothyroidism - TSH WNL - Continue current-dose Synthroid for now    DVT prophylaxis:  heparin  Code Status: Full Code   Family Communication:   Disposition Plan:     Consultants:   cardiology    Subjective: Awaiting cath today  Objective: Vitals:   07/28/16 0925 07/28/16 1500 07/28/16 2057 07/29/16 0423  BP: (!) 150/73 (!) 159/62 (!) 166/69 (!) 179/65  Pulse: 61 (!) 58 (!) 56 73  Resp:   20 20  Temp:   97.7 F (36.5 C) 97.3 F (36.3 C)  TempSrc:   Oral Oral  SpO2: 99% 99% 97% 100%  Weight:    117.8 kg (259 lb 11.2 oz)  Height:        Intake/Output Summary (Last 24 hours) at 07/29/16 1002 Last data filed at 07/29/16 0900  Gross per 24 hour  Intake              480 ml  Output                0 ml  Net              480 ml   Filed Weights   07/27/16 0638 07/28/16 0440 07/29/16 0423  Weight: 119.1 kg (262 lb 8 oz) 117.6 kg (259 lb 4.8 oz) 117.8 kg (259 lb 11.2 oz)    Examination:  General exam: Appears calm and comfortable  Respiratory system: Clear to auscultation. Respiratory effort normal. Cardiovascular system: S1 & S2 heard, RRR. + murmurs Gastrointestinal system: Abdomen is nondistended, soft and nontender. No organomegaly or masses felt. Normal bowel sounds heard. Central nervous system: Alert and oriented. No focal neurological deficits.     Data Reviewed: I have personally reviewed following labs and imaging studies  CBC:  Recent Labs Lab 07/27/16 0156 07/27/16 0957 07/28/16 0258 07/29/16 0122  WBC 2.8*  --  5.4 6.0  HGB 8.4* 11.8* 11.8* 12.3*  HCT 22.7* 32.3* 33.8* 34.9*  MCV 83.2  --  84.5 85.5    PLT 121*  --  129* 809*   Basic Metabolic Panel:  Recent Labs Lab 07/27/16 0156 07/27/16 0957 07/28/16 0258 07/29/16 0122  NA 136  --  134* 135  K 5.2* 4.3 4.0 4.0  CL 105  --  102 102  CO2 19*  --  25 25  GLUCOSE 449*  --  205* 225*  BUN 26*  --  22* 22*  CREATININE 1.74*  --  1.49* 1.56*  CALCIUM 8.3*  --  8.8* 8.9   GFR: Estimated Creatinine Clearance: 55.1 mL/min (by C-G formula based on SCr of 1.56 mg/dL (H)). Liver Function Tests:  Recent Labs Lab 07/27/16 0156  AST 60*  ALT 22  ALKPHOS 33*  BILITOT 1.4*  PROT 5.9*  ALBUMIN 3.5    Recent Labs  Lab 07/27/16 0156  LIPASE 43   No results for input(s): AMMONIA in the last 168 hours. Coagulation Profile:  Recent Labs Lab 07/29/16 0122  INR 1.01   Cardiac Enzymes:  Recent Labs Lab 07/27/16 0957 07/27/16 1641 07/28/16 1417 07/28/16 1819 07/29/16 0122  TROPONINI 0.29* 1.63* 1.88* 1.58* 1.78*   BNP (last 3 results) No results for input(s): PROBNP in the last 8760 hours. HbA1C:  Recent Labs  07/27/16 0536  HGBA1C 7.1*   CBG:  Recent Labs Lab 07/28/16 0831 07/28/16 1111 07/28/16 1620 07/28/16 2128 07/29/16 0620  GLUCAP 233* 241* 255* 264* 261*   Lipid Profile: No results for input(s): CHOL, HDL, LDLCALC, TRIG, CHOLHDL, LDLDIRECT in the last 72 hours. Thyroid Function Tests:  Recent Labs  07/27/16 0536  TSH 4.452   Anemia Panel:  Recent Labs  07/27/16 0957  FERRITIN 456*  TIBC 371  IRON 62   Urine analysis:    Component Value Date/Time   COLORURINE YELLOW 05/11/2016 1600   APPEARANCEUR CLEAR 05/11/2016 1600   LABSPEC 1.021 05/11/2016 1600   PHURINE 5.5 05/11/2016 1600   GLUCOSEU NEGATIVE 05/11/2016 1600   GLUCOSEU NEGATIVE 11/07/2013 0754   HGBUR NEGATIVE 05/11/2016 1600   BILIRUBINUR NEGATIVE 05/11/2016 1600   BILIRUBINUR neg 03/08/2015 1105   KETONESUR NEGATIVE 05/11/2016 1600   PROTEINUR NEGATIVE 05/11/2016 1600   UROBILINOGEN negative 03/08/2015 1105    UROBILINOGEN 0.2 02/09/2014 1805   NITRITE NEGATIVE 05/11/2016 1600   LEUKOCYTESUR NEGATIVE 05/11/2016 1600     )No results found for this or any previous visit (from the past 240 hour(s)).    Anti-infectives    None       Radiology Studies: No results found.      Scheduled Meds: . allopurinol  200 mg Oral Daily  . amiodarone  200 mg Oral Daily  . atorvastatin  40 mg Oral q1800  . diltiazem  180 mg Oral Daily  . fenofibrate  160 mg Oral Daily  . insulin aspart  0-5 Units Subcutaneous QHS  . insulin aspart  0-9 Units Subcutaneous TID WC  . insulin glargine  50 Units Subcutaneous Daily  . levothyroxine  150 mcg Oral QAC breakfast  . metoprolol  50 mg Oral BID  . sodium chloride flush  3 mL Intravenous Q12H   Continuous Infusions: . sodium chloride 75 mL/hr at 07/28/16 2235  . heparin 2,200 Units/hr (07/29/16 0205)     LOS: 2 days    Time spent: 25 min    [place], DO [facility] Pager [phone]  If 7PM-7AM, please contact night-coverage [email or web address] Password [email or web address] 07/29/2016, 10:02 AM

## 2016-07-29 NOTE — Progress Notes (Signed)
[place] for heparin (apixaban PTA) Indication: chest pain/ACS and atrial fibrillation  Allergies  Allergen Reactions  . Percocet [Oxycodone-Acetaminophen] Nausea And Vomiting  . Invokana [Canagliflozin]     Side effects  . Metformin And Related     Upset stomach    Patient Measurements: Height: 5\' 11"  (180.3 cm) Weight: 259 lb 11.2 oz (117.8 kg) IBW/kg (Calculated) : 75.3 Heparin Dosing Weight: 101 kg   Vital Signs: Temp: 97.3 F (36.3 C) (01/03 0423) Temp Source: Oral (01/03 0423) BP: 179/65 (01/03 0423) Pulse Rate: 73 (01/03 0423)  Labs:  Recent Labs  07/27/16 0156  07/27/16 0957  07/28/16 0258 07/28/16 1037 07/28/16 1417 07/28/16 1819 07/29/16 0122 07/29/16 0854  HGB 8.4*  --  11.8*  --  11.8*  --   --   --  12.3*  --   HCT 22.7*  --  32.3*  --  33.8*  --   --   --  34.9*  --   PLT 121*  --   --   --  129*  --   --   --  141*  --   APTT  --   --   --   < >  --  67*  --  65* 56* 92*  LABPROT  --   --   --   --   --   --   --   --  13.3  --   INR  --   --   --   --   --   --   --   --  1.01  --   HEPARINUNFRC  --   --   --   < >  --  0.51  --   --  0.31 0.47  CREATININE 1.74*  --   --   --  1.49*  --   --   --  1.56*  --   TROPONINI  --   < > 0.29*  < >  --   --  1.88* 1.58* 1.78*  --   < > = values in this interval not displayed.  Estimated Creatinine Clearance: 55.1 mL/min (by C-G formula based on SCr of 1.56 mg/dL (H)).   Medical History: Past Medical History:  Diagnosis Date  . Allergy   . Arthritis   . AVM (arteriovenous malformation) of colon   . CAD (coronary artery disease)    a. Cath 10/2014 - Mild to moderate diagonal, circumflex and obtuse marginal disease. Innominate artery sternosis.  . Carotid artery disease ([place])    a. Carotid dopple 03/2014 85-02% RICA &  77-41% LICA stenosis b. 2/87  . CKD (chronic kidney disease), stage III   . Clostridium difficile colitis 05/13/2016  . Dysrhythmia    ATRIAL  FIBRILATION  . Elevated troponin    a. 09/2014 in setting of AF RVR-->Myoview: EF 49%, no ischemia/infarct->Med Rx.  . GERD (gastroesophageal reflux disease)   . Gout   . H/O transfusion of whole blood   . History of PFTs    PFTs 6/16:  FVC 3.73 (87%), FEV1 2.93 (88%), FEV1/FVC 78%, DLCO 69%  . Hx of adenomatous colonic polyps 07/02/2016  . Hyperlipidemia   . Hypertension   . Hypothyroidism   . Iron deficiency anemia   . PAF (paroxysmal atrial fibrillation) ([place])    a. 09/2014: Converted on D[place];  b. CHA2DS2VASc = 3-->eliquis;  c. 09/2014 Echo: EF 55-60%, mild LVH, mildly dil LA.  [name] Personal  history of colonic polyps 2007, 2008   adenoma each time, largest 12 mm in 2007  . Psoriasis   . Type II diabetes mellitus (HCC)    TYPE 2    Assessment: 76 yo M continuing on IV heparin for r/o ACS, troponins trending up. On apixaban prior to admission for atrial fibrillation. Apixaban now on hold, with last dose 12/31 PM. Heparin levels elevated due to apixaban exposure, so aPTTs will be used to monitor anticoagulation effects until heparin levels correlating. CBC stable with noted chronic pancytopenia. No bleeding documented. Cath for 1/3.  APTT now therapeutic (92) on heparin at 2200 units/h. Heparin level 0.47 this AM. Appears to be correlating.   Goal of Therapy:  Heparin level 0.3-0.7 units/ml aPTT 66-102 seconds Monitor platelets by anticoagulation protocol: Yes   Plan:  Heparin infusion at 2200 units/hr Daily aPTT/heparin level/CBC Monitor for s/sx bleeding Apixaban on hold - F/u plans post-cath 1/3   Elicia Lamp, PharmD, BCPS Clinical Pharmacist 07/29/2016 9:56 AM

## 2016-07-29 NOTE — Progress Notes (Signed)
ANTICOAGULATION CONSULT NOTE - Follow Up Consult  Pharmacy Consult for heparin Indication: Afib and NSTEMI  Labs:  Recent Labs  07/27/16 0156  07/27/16 0957 07/27/16 1641  07/28/16 0258 07/28/16 1037 07/28/16 1417 07/28/16 1819 07/29/16 0122  HGB 8.4*  --  11.8*  --   --  11.8*  --   --   --  12.3*  HCT 22.7*  --  32.3*  --   --  33.8*  --   --   --  34.9*  PLT 121*  --   --   --   --  129*  --   --   --  141*  APTT  --   --   --  33  < >  --  67*  --  65* 56*  HEPARINUNFRC  --   --   --  0.52  --   --  0.51  --   --  0.31  CREATININE 1.74*  --   --   --   --  1.49*  --   --   --   --   TROPONINI  --   < > 0.29* 1.63*  --   --   --  1.88* 1.58*  --   < > = values in this interval not displayed.   Assessment: 76yo male now w/ lower PTT despite rate increase; RN reports IV had infiltrated and needed new IV site.  Goal of Therapy:  aPTT 66-102 seconds   Plan:  Will increase heparin gtt by 10% to 2200 units/hr and check level in 6hr.  Wynona Neat, PharmD, BCPS  07/29/2016,1:59 AM

## 2016-07-29 NOTE — Progress Notes (Signed)
Notified Traid of Pt's troponin level of 1.78. No new orders at this time. Will continue to monitor. Isac Caddy, RN

## 2016-07-29 NOTE — Progress Notes (Addendum)
[place] for heparin (apixaban PTA) Indication: atrial fibrillation  Allergies  Allergen Reactions  . Percocet [Oxycodone-Acetaminophen] Nausea And Vomiting  . Invokana [Canagliflozin]     Side effects  . Metformin And Related     Upset stomach    Patient Measurements: Height: 5\' 11"  (180.3 cm) Weight: 259 lb 11.2 oz (117.8 kg) IBW/kg (Calculated) : 75.3 Heparin Dosing Weight: 101 kg   Vital Signs: Temp: 97.3 F (36.3 C) (01/03 0423) Temp Source: Oral (01/03 0423) BP: 179/90 (01/03 1345) Pulse Rate: 72 (01/03 1345)  Labs:  Recent Labs  07/27/16 0156  07/27/16 0957  07/28/16 0258 07/28/16 1037 07/28/16 1417 07/28/16 1819 07/29/16 0122 07/29/16 0854  HGB 8.4*  --  11.8*  --  11.8*  --   --   --  12.3*  --   HCT 22.7*  --  32.3*  --  33.8*  --   --   --  34.9*  --   PLT 121*  --   --   --  129*  --   --   --  141*  --   APTT  --   --   --   < >  --  67*  --  65* 56* 92*  LABPROT  --   --   --   --   --   --   --   --  13.3  --   INR  --   --   --   --   --   --   --   --  1.01  --   HEPARINUNFRC  --   --   --   < >  --  0.51  --   --  0.31 0.47  CREATININE 1.74*  --   --   --  1.49*  --   --   --  1.56*  --   TROPONINI  --   < > 0.29*  < >  --   --  1.88* 1.58* 1.78*  --   < > = values in this interval not displayed.  Estimated Creatinine Clearance: 55.1 mL/min (by C-G formula based on SCr of 1.56 mg/dL (H)).   Assessment: 76 yo M admitted for r/o ACS, troponins trending up. On apixaban prior to admission for atrial fibrillation. Apixaban now on hold, with last dose 12/31 PM. Heparin levels elevated due to apixaban exposure, so aPTTs will be used to monitor anticoagulation effects until heparin levels correlating. CBC stable with noted chronic pancytopenia. No bleeding documented.  Patient is s/p cath with PCI to OM1 with placement of DES. Pharmacy consulted to resume IV heparin 8 hrs post sheath removal however this has not  been done yet.  Heparin level was correlating with aPTT this morning so only monitoring heparin levels now. Last heparin level was therapeutic on 2200 units/hr.  Goal of Therapy:  Heparin level 0.3-0.7 units/ml Monitor platelets by anticoagulation protocol: Yes   Plan:  F/u sheath removal and restarting of IV heparin Monitor for s/sx bleeding Apixaban on hold   Renold Genta, PharmD, BCPS Clinical Pharmacist Phone for tonight - [place] - [phone] 07/29/2016 3:45 PM   Addendum: Damaris Schooner with Clair Gulling, RN and sheath out at 18:40.   Resume heparin drip at 2200 units/hr with no bolus at 02:45 on 1/4 8 hr heparin level Daily heparin level and CBC Monitor for s/sx of bleeding   Renold Genta, PharmD, BCPS Clinical Pharmacist 07/29/2016 8:25  PM       

## 2016-07-29 NOTE — Interval H&P Note (Signed)
History and Physical Interval Note:  07/29/2016 11:29 AM  Michael [name]  has presented today for cardiac catheterization, with the diagnosis of shortness of breath. The various methods of treatment have been discussed with the patient and family. After consideration of risks, benefits and other options for treatment, the patient has consented to  Procedure(s): Right/Left Heart Cath and Coronary Angiography (N/A) as a surgical intervention .  The patient's history has been reviewed, patient examined, no change in status, stable for surgery.  I have reviewed the patient's chart and labs.  Questions were answered to the patient's satisfaction.    Cath Lab Visit (complete for each Cath Lab visit)  Clinical Evaluation Leading to the Procedure:   ACS: Yes.    Non-ACS:  N/A  Lou Irigoyen

## 2016-07-29 NOTE — Progress Notes (Signed)
Patient Name: Michael [name] Date of Encounter: 07/29/2016  Primary Cardiologist: Dr. Debbe Odea Problem List     Principal Problem:   Chest pain Active Problems:   Pancytopenia ([place])   DM (diabetes mellitus), type 2 with peripheral vascular complications (HCC)   PAF (paroxysmal atrial fibrillation) (HCC)   Essential hypertension   Cholelithiasis   Hypothyroidism   Acute renal failure superimposed on stage 3 chronic kidney disease (HCC)   Hyperkalemia   GIB (gastrointestinal bleeding)   Chronic diastolic CHF (congestive heart failure) (HCC)   Moderate aortic stenosis     Subjective   Feels well, denies chest pain and SOB.   Inpatient Medications    Scheduled Meds: . allopurinol  200 mg Oral Daily  . amiodarone  200 mg Oral Daily  . atorvastatin  40 mg Oral q1800  . diltiazem  180 mg Oral Daily  . fenofibrate  160 mg Oral Daily  . insulin aspart  0-5 Units Subcutaneous QHS  . insulin aspart  0-9 Units Subcutaneous TID WC  . insulin glargine  50 Units Subcutaneous Daily  . levothyroxine  150 mcg Oral QAC breakfast  . metoprolol  50 mg Oral BID  . sodium chloride flush  3 mL Intravenous Q12H   Continuous Infusions: . sodium chloride 75 mL/hr at 07/28/16 2235  . heparin 2,200 Units/hr (07/29/16 0205)   PRN Meds: sodium chloride, acetaminophen, hydrALAZINE, HYDROcodone-acetaminophen, meclizine, nitroGLYCERIN, ondansetron (ZOFRAN) IV, sodium chloride flush   Vital Signs    Vitals:   07/28/16 0925 07/28/16 1500 07/28/16 2057 07/29/16 0423  BP: (!) 150/73 (!) 159/62 (!) 166/69 (!) 179/65  Pulse: 61 (!) 58 (!) 56 73  Resp:   20 20  Temp:   97.7 F (36.5 C) 97.3 F (36.3 C)  TempSrc:   Oral Oral  SpO2: 99% 99% 97% 100%  Weight:    259 lb 11.2 oz (117.8 kg)  Height:        Intake/Output Summary (Last 24 hours) at 07/29/16 0813 Last data filed at 07/28/16 1700  Gross per 24 hour  Intake              480 ml  Output                0 ml  Net               480 ml   Filed Weights   07/27/16 0638 07/28/16 0440 07/29/16 0423  Weight: 262 lb 8 oz (119.1 kg) 259 lb 4.8 oz (117.6 kg) 259 lb 11.2 oz (117.8 kg)    Physical Exam   GEN: Well nourished, well developed, in no acute distress.  HEENT: Grossly normal.  Neck: Supple, no JVD, carotid bruits, or masses. Cardiac: RRR, no murmurs, rubs, or gallops. No clubbing, cyanosis, edema.  Radials/DP/PT 2+ and equal bilaterally.  Respiratory:  Respirations regular and unlabored, clear to auscultation bilaterally. GI: Soft, nontender, nondistended, BS + x 4. MS: no deformity or atrophy. Skin: warm and dry, no rash. Neuro:  Strength and sensation are intact. Psych: AAOx3.  Normal affect.  Labs    CBC  Recent Labs  07/28/16 0258 07/29/16 0122  WBC 5.4 6.0  HGB 11.8* 12.3*  HCT 33.8* 34.9*  MCV 84.5 85.5  PLT 129* 706*   Basic Metabolic Panel  Recent Labs  07/28/16 0258 07/29/16 0122  NA 134* 135  K 4.0 4.0  CL 102 102  CO2 25 25  GLUCOSE 205* 225*  BUN 22* 22*  CREATININE 1.49* 1.56*  CALCIUM 8.8* 8.9   Liver Function Tests  Recent Labs  07/27/16 0156  AST 60*  ALT 22  ALKPHOS 33*  BILITOT 1.4*  PROT 5.9*  ALBUMIN 3.5    Recent Labs  07/27/16 0156  LIPASE 43   Cardiac Enzymes  Recent Labs  07/28/16 1417 07/28/16 1819 07/29/16 0122  TROPONINI 1.88* 1.58* 1.78*   Hemoglobin A1C  Recent Labs  07/27/16 0536  HGBA1C 7.1*   Thyroid Function Tests  Recent Labs  07/27/16 0536  TSH 4.452    Telemetry    NSR - Personally Reviewed  ECG    NSR, old septal infarct.- Personally Reviewed  Radiology    No results found.   Patient Profile     Mr. Ahart is a 76 year old male with a past medical history of non obstructive CAD, PAF on eliquis, prior hx of GI bleed due to AVM requiring blood transfusion, CKD stage III, HTN, HLD, IDA, DM, bilateral carotid stenosis and right subclavian artery stenosis. He was admitted on 07/27/16 with SOB and chest  pain.   Assessment & Plan    1. NSTEMI: His chest has typical and atypical symptoms. Chest pain resolved after GI cocktail in ER. Peak of troponin 1.78. Continue IV heparin.  EKG without acute changes.   For cath today.   He had cardiac catheterization in April of 2016which demonstrated mild to moderate diagonal, circumflex and OM disease, normal LV function and borderline significant innominate artery stenosis. The catheterization was done via the right radial artery in the patient was noted to have calcified stenosis involving the right innominate artery. The patient underwent carotid US and upper extremity arterial duplex. He has bilateral ICA stenosis 40-59%  2. Chronic abdominal pain with cholelithiasis Will defer further management per primary.  3. PAF Maintaining sinus rhythm. CHADSVASc score of 5. Continue amiodarone and Eliquis for anticoagulation.  4. Bilateral carotid stenosis - Most recently evaluated in May 2017. Repeat study in one year.  5. Right subclavian stenosis -  Appears to be asymptomatic. Continue medical therapy. Followed by Dr. Fletcher Anon.  6. CKD stage II - Baseline creatinine around 1.4-1.5. Kidney function improved with IV hydration  7. Acute on chronic anemia with pancytopenia - Hgb 12.8. History of GI bleed requiring blood transfusion. Stool guaiac negative this admission.  Signed, Arbutus Leas, NP  07/29/2016, 8:13 AM

## 2016-07-30 ENCOUNTER — Inpatient Hospital Stay (HOSPITAL_COMMUNITY): Payer: Medicare Other

## 2016-07-30 ENCOUNTER — Encounter (HOSPITAL_COMMUNITY): Payer: Self-pay | Admitting: Internal Medicine

## 2016-07-30 ENCOUNTER — Other Ambulatory Visit: Payer: Medicare Other

## 2016-07-30 DIAGNOSIS — N179 Acute kidney failure, unspecified: Secondary | ICD-10-CM

## 2016-07-30 DIAGNOSIS — N183 Chronic kidney disease, stage 3 (moderate): Secondary | ICD-10-CM

## 2016-07-30 DIAGNOSIS — I209 Angina pectoris, unspecified: Secondary | ICD-10-CM

## 2016-07-30 DIAGNOSIS — I2510 Atherosclerotic heart disease of native coronary artery without angina pectoris: Secondary | ICD-10-CM

## 2016-07-30 DIAGNOSIS — R509 Fever, unspecified: Secondary | ICD-10-CM

## 2016-07-30 LAB — HEPATIC FUNCTION PANEL
ALBUMIN: 3.5 g/dL (ref 3.5–5.0)
ALK PHOS: 36 U/L — AB (ref 38–126)
ALT: 24 U/L (ref 17–63)
AST: 40 U/L (ref 15–41)
BILIRUBIN DIRECT: 0.4 mg/dL (ref 0.1–0.5)
BILIRUBIN TOTAL: 1.3 mg/dL — AB (ref 0.3–1.2)
Indirect Bilirubin: 0.9 mg/dL (ref 0.3–0.9)
Total Protein: 6.7 g/dL (ref 6.5–8.1)

## 2016-07-30 LAB — BASIC METABOLIC PANEL
Anion gap: 13 (ref 5–15)
BUN: 29 mg/dL — ABNORMAL HIGH (ref 6–20)
CALCIUM: 8.4 mg/dL — AB (ref 8.9–10.3)
CO2: 18 mmol/L — AB (ref 22–32)
CREATININE: 2.03 mg/dL — AB (ref 0.61–1.24)
Chloride: 103 mmol/L (ref 101–111)
GFR calc non Af Amer: 31 mL/min — ABNORMAL LOW (ref >60)
GFR, EST AFRICAN AMERICAN: 36 mL/min — AB (ref >60)
GLUCOSE: 250 mg/dL — AB (ref 65–99)
Potassium: 4 mmol/L (ref 3.5–5.1)
Sodium: 134 mmol/L — ABNORMAL LOW (ref 135–145)

## 2016-07-30 LAB — URIC ACID: Uric Acid, Serum: 4.3 mg/dL — ABNORMAL LOW (ref 4.4–7.6)

## 2016-07-30 LAB — BLOOD CULTURE ID PANEL (REFLEXED)
Acinetobacter baumannii: NOT DETECTED
CANDIDA ALBICANS: NOT DETECTED
CANDIDA GLABRATA: NOT DETECTED
CANDIDA TROPICALIS: NOT DETECTED
Candida krusei: NOT DETECTED
Candida parapsilosis: NOT DETECTED
ENTEROBACTER CLOACAE COMPLEX: NOT DETECTED
ESCHERICHIA COLI: NOT DETECTED
Enterobacteriaceae species: NOT DETECTED
Enterococcus species: NOT DETECTED
Haemophilus influenzae: NOT DETECTED
KLEBSIELLA PNEUMONIAE: NOT DETECTED
Klebsiella oxytoca: NOT DETECTED
LISTERIA MONOCYTOGENES: NOT DETECTED
Methicillin resistance: NOT DETECTED
NEISSERIA MENINGITIDIS: NOT DETECTED
PROTEUS SPECIES: NOT DETECTED
Pseudomonas aeruginosa: NOT DETECTED
STREPTOCOCCUS AGALACTIAE: NOT DETECTED
STREPTOCOCCUS PYOGENES: NOT DETECTED
STREPTOCOCCUS SPECIES: NOT DETECTED
Serratia marcescens: NOT DETECTED
Staphylococcus aureus (BCID): DETECTED — AB
Staphylococcus species: DETECTED — AB
Streptococcus pneumoniae: NOT DETECTED

## 2016-07-30 LAB — CBC
HEMATOCRIT: 32.8 % — AB (ref 39.0–52.0)
Hemoglobin: 10.8 g/dL — ABNORMAL LOW (ref 13.0–17.0)
MCH: 29.2 pg (ref 26.0–34.0)
MCHC: 32.9 g/dL (ref 30.0–36.0)
MCV: 88.6 fL (ref 78.0–100.0)
PLATELETS: 100 10*3/uL — AB (ref 150–400)
RBC: 3.7 MIL/uL — ABNORMAL LOW (ref 4.22–5.81)
RDW: 16.4 % — AB (ref 11.5–15.5)
WBC: 8.5 10*3/uL (ref 4.0–10.5)

## 2016-07-30 LAB — INFLUENZA PANEL BY PCR (TYPE A & B)
INFLAPCR: NEGATIVE
Influenza B By PCR: NEGATIVE

## 2016-07-30 LAB — GLUCOSE, CAPILLARY
GLUCOSE-CAPILLARY: 225 mg/dL — AB (ref 65–99)
GLUCOSE-CAPILLARY: 193 mg/dL — AB (ref 65–99)
GLUCOSE-CAPILLARY: 184 mg/dL — AB (ref 65–99)
Glucose-Capillary: 276 mg/dL — ABNORMAL HIGH (ref 65–99)

## 2016-07-30 LAB — HEPARIN LEVEL (UNFRACTIONATED): Heparin Unfractionated: 0.55 IU/mL (ref 0.30–0.70)

## 2016-07-30 LAB — MRSA PCR SCREENING: MRSA by PCR: NEGATIVE

## 2016-07-30 MED ORDER — SODIUM CHLORIDE 0.9 % IV SOLN
INTRAVENOUS | Status: DC
  Administered 2016-07-30 – 2016-08-04 (×5): via INTRAVENOUS

## 2016-07-30 MED ORDER — INSULIN GLARGINE 100 UNIT/ML ~~LOC~~ SOLN
60.0000 [IU] | Freq: Every day | SUBCUTANEOUS | Status: DC
  Administered 2016-07-31 – 2016-08-04 (×5): 60 [IU] via SUBCUTANEOUS
  Filled 2016-07-30 (×6): qty 0.6

## 2016-07-30 MED ORDER — ACETAMINOPHEN 325 MG PO TABS
650.0000 mg | ORAL_TABLET | ORAL | Status: DC | PRN
  Administered 2016-07-30 – 2016-08-03 (×5): 650 mg via ORAL
  Filled 2016-07-30 (×5): qty 2

## 2016-07-30 MED ORDER — OSELTAMIVIR PHOSPHATE 30 MG PO CAPS
30.0000 mg | ORAL_CAPSULE | Freq: Two times a day (BID) | ORAL | Status: DC
  Administered 2016-07-30 (×2): 30 mg via ORAL
  Filled 2016-07-30 (×2): qty 1

## 2016-07-30 MED ORDER — VANCOMYCIN HCL IN DEXTROSE 1-5 GM/200ML-% IV SOLN
1000.0000 mg | Freq: Once | INTRAVENOUS | Status: DC
  Filled 2016-07-30: qty 200

## 2016-07-30 MED ORDER — SODIUM CHLORIDE 0.9 % IV SOLN
1250.0000 mg | INTRAVENOUS | Status: DC
  Administered 2016-07-31: 10:00:00 1250 mg via INTRAVENOUS
  Filled 2016-07-30 (×2): qty 1250

## 2016-07-30 MED ORDER — METOPROLOL TARTRATE 50 MG PO TABS
62.5000 mg | ORAL_TABLET | Freq: Two times a day (BID) | ORAL | Status: DC
  Administered 2016-07-30 – 2016-07-31 (×2): 62.5 mg via ORAL
  Filled 2016-07-30: qty 2
  Filled 2016-07-30: qty 1

## 2016-07-30 MED ORDER — VANCOMYCIN HCL 10 G IV SOLR
2000.0000 mg | Freq: Once | INTRAVENOUS | Status: AC
  Administered 2016-07-30: 2000 mg via INTRAVENOUS
  Filled 2016-07-30: qty 2000

## 2016-07-30 MED ORDER — CEFAZOLIN SODIUM-DEXTROSE 2-4 GM/100ML-% IV SOLN
2.0000 g | Freq: Three times a day (TID) | INTRAVENOUS | Status: AC
  Administered 2016-07-30: 2 g via INTRAVENOUS
  Filled 2016-07-30: qty 100

## 2016-07-30 MED ORDER — APIXABAN 5 MG PO TABS
5.0000 mg | ORAL_TABLET | Freq: Two times a day (BID) | ORAL | Status: DC
  Administered 2016-07-30 – 2016-08-04 (×11): 5 mg via ORAL
  Filled 2016-07-30 (×11): qty 1

## 2016-07-30 NOTE — Progress Notes (Signed)
PROGRESS NOTE    Michael [name]  DDU:[id number] DOB: 18-May-1941 DOA: 07/27/2016 PCP: [name], MD   Outpatient Specialists:     Brief Narrative:  Michael [name] is a 76 y.o. male with medical history significant for insulin-dependent diabetes mellitus, hypertension, coronary artery disease, chronic kidney disease stage III, paroxysmal atrial fibrillation on Eliquis, and reported small bowel AVMs who presents to the emergency department with chest pain and dyspnea. Patient reports that he had been in his usual state of health aside from some loose stools, but shortly after lying down to sleep tonight, developed acute onset of pressure sensation in the mid to lower chest with acute dyspnea. There was radiation of the pain down the left arm, but no diaphoresis or nausea. Patient also describes a general malaise and vague sense of unwellness. Found to have NSTEMI and cath done-- stent placed.  After cath, patient developed fever with some altered mental status.  Appears to have some cellulitis on left wrist- will r/o flu and give IV abx.  Blood cultures pending   Assessment & Plan:   Principal Problem:   Chest pain Active Problems:   Pancytopenia (HCC)   DM (diabetes mellitus), type 2 with peripheral vascular complications (HCC)   PAF (paroxysmal atrial fibrillation) (HCC)   Essential hypertension   Cholelithiasis   Hypothyroidism   Acute renal failure superimposed on stage 3 chronic kidney disease (HCC)   Hyperkalemia   GIB (gastrointestinal bleeding)   Chronic diastolic CHF (congestive heart failure) (HCC)   Nonrheumatic aortic valve stenosis   NSTEMI (non-ST elevated myocardial infarction) (HCC)   Chest pain-- patient describes more of a RUQ pain to me - Pt with hx of CAD - troponin trending up-- cardiology consult-- s/p cath 1/3 - stent placed  Fever -r/o flu-- start tamiflu until back -blood cultures pending -vanc for possible left wrist cellulitis x ray shows some soft  tissue swelling and possible hematom (wife reports that EMS placed IV there)  DM -SSI- adjust to moderate -lantus -HgbA1c: 7.1-- improved from previous  Acute on chronic anemia; pancytopenia  - suspect Hgb of 8 was lab error-- repeat is 11  Acute kidney injury superimposed on CKD stage III  - SCr is 1.74 on admission, up from apparent baseline of 1.4  - IVF - BMP daily  Atrial fibrillation, paroxysmal - In a sinus rhythm on admission - CHADS-VASc 5 (age, CHF, CAD, HTN, DM)- resume blood thinner at d/c - Continue amiodarone as tolerated  Hypertension  - Hypertensive in ED and treated with Lopressor in setting of chest pain  - Lasix and valsartan held as above given bump in SCr - Continue Diltiazem, Lopressorwith PRN hydralazine  Chronic diastolic CHF - Pt appears roughly euvolemic on admission  - TTE (07/14/16) with EF 23-76%, grade 1 diastolic dysfunction, mod AS, mild LAE, and mild TR   Cholelithiasis  - Pt was tender in RUQ with cholelithiasis noted on [name] without evidence for acute cholecystitis upon admission -no current RUQ pain  - HIDA as outpatient  Hypothyroidism - TSH WNL - Continue current-dose Synthroid for now    DVT prophylaxis:  heparin  Code Status: Full Code   Family Communication: Wife updated at bedside  Disposition Plan:     Consultants:   cardiology    Subjective: Awaiting cath today  Objective: Vitals:   07/29/16 2200 07/29/16 2300 07/30/16 0700 07/30/16 0855  BP:  (!) 160/42 (!) 172/62 (!) 170/46  Pulse:   (!) 101 93  Resp: [name])  24 17 (!) 24 (!) 22  Temp: 98.8 F (37.1 C)  (!) 103.1 F (39.5 C) (!) 103 F (39.4 C)  TempSrc: Oral  Oral Oral  SpO2:   93% 93%  Weight:   118 kg (260 lb 2.3 oz)   Height:        Intake/Output Summary (Last 24 hours) at 07/30/16 1302 Last data filed at 07/30/16 0900  Gross per 24 hour  Intake          1162.97 ml  Output              600 ml  Net           562.97 ml   Filed  Weights   07/28/16 0440 07/29/16 0423 07/30/16 0700  Weight: 117.6 kg (259 lb 4.8 oz) 117.8 kg (259 lb 11.2 oz) 118 kg (260 lb 2.3 oz)    Examination:  General exam: ill appearing, alert but occasionally confused Respiratory system: diminished, no wheezing Cardiovascular system: S1 & S2 heard, RRR. + murmurs Gastrointestinal system: Abdomen is nondistended, soft and nontender. No organomegaly or masses felt. Normal bowel sounds heard. Central nervous system: will awake and answer questions     Data Reviewed: I have personally reviewed following labs and imaging studies  CBC:  Recent Labs Lab 07/27/16 0156 07/27/16 0957 07/28/16 0258 07/29/16 0122 07/30/16 0346  WBC 2.8*  --  5.4 6.0 8.5  HGB 8.4* 11.8* 11.8* 12.3* 10.8*  HCT 22.7* 32.3* 33.8* 34.9* 32.8*  MCV 83.2  --  84.5 85.5 88.6  PLT 121*  --  129* 141* 245*   Basic Metabolic Panel:  Recent Labs Lab 07/27/16 0156 07/27/16 0957 07/28/16 0258 07/29/16 0122 07/30/16 0346  NA 136  --  134* 135 134*  K 5.2* 4.3 4.0 4.0 4.0  CL 105  --  102 102 103  CO2 19*  --  25 25 18*  GLUCOSE 449*  --  205* 225* 250*  BUN 26*  --  22* 22* 29*  CREATININE 1.74*  --  1.49* 1.56* 2.03*  CALCIUM 8.3*  --  8.8* 8.9 8.4*   GFR: Estimated Creatinine Clearance: 42.4 mL/min (by C-G formula based on SCr of 2.03 mg/dL (H)). Liver Function Tests:  Recent Labs Lab 07/27/16 0156 07/30/16 0751  AST 60* 40  ALT 22 24  ALKPHOS 33* 36*  BILITOT 1.4* 1.3*  PROT 5.9* 6.7  ALBUMIN 3.5 3.5    Recent Labs Lab 07/27/16 0156  LIPASE 43   No results for input(s): AMMONIA in the last 168 hours. Coagulation Profile:  Recent Labs Lab 07/29/16 0122  INR 1.01   Cardiac Enzymes:  Recent Labs Lab 07/27/16 0957 07/27/16 1641 07/28/16 1417 07/28/16 1819 07/29/16 0122  TROPONINI 0.29* 1.63* 1.88* 1.58* 1.78*   BNP (last 3 results) No results for input(s): PROBNP in the last 8760 hours. HbA1C: No results for input(s):  HGBA1C in the last 72 hours. CBG:  Recent Labs Lab 07/29/16 0620 07/29/16 1634 07/29/16 2214 07/30/16 0658 07/30/16 1241  GLUCAP 261* 211* 318* 276* 225*   Lipid Profile: No results for input(s): CHOL, HDL, LDLCALC, TRIG, CHOLHDL, LDLDIRECT in the last 72 hours. Thyroid Function Tests: No results for input(s): TSH, T4TOTAL, FREET4, T3FREE, THYROIDAB in the last 72 hours. Anemia Panel: No results for input(s): VITAMINB12, FOLATE, FERRITIN, TIBC, IRON, RETICCTPCT in the last 72 hours. Urine analysis:    Component Value Date/Time   COLORURINE YELLOW 05/11/2016 1600   APPEARANCEUR CLEAR 05/11/2016 1600  LABSPEC 1.021 05/11/2016 1600   PHURINE 5.5 05/11/2016 1600   GLUCOSEU NEGATIVE 05/11/2016 1600   GLUCOSEU NEGATIVE 11/07/2013 0754   HGBUR NEGATIVE 05/11/2016 1600   BILIRUBINUR NEGATIVE 05/11/2016 1600   BILIRUBINUR neg 03/08/2015 1105   KETONESUR NEGATIVE 05/11/2016 1600   PROTEINUR NEGATIVE 05/11/2016 1600   UROBILINOGEN negative 03/08/2015 1105   UROBILINOGEN 0.2 02/09/2014 1805   NITRITE NEGATIVE 05/11/2016 1600   LEUKOCYTESUR NEGATIVE 05/11/2016 1600     ) Recent Results (from the past 240 hour(s))  MRSA PCR Screening     Status: None   Collection Time: 07/30/16 10:25 AM  Result Value Ref Range Status   MRSA by PCR NEGATIVE NEGATIVE Final    Comment:        The GeneXpert MRSA Assay (FDA approved for NASAL specimens only), is one component of a comprehensive MRSA colonization surveillance program. It is not intended to diagnose MRSA infection nor to guide or monitor treatment for MRSA infections.       Anti-infectives    Start     Dose/Rate Route Frequency Ordered Stop   07/31/16 0800  vancomycin (VANCOCIN) 1,250 mg in sodium chloride 0.9 % 250 mL IVPB     1,250 mg 166.7 mL/hr over 90 Minutes Intravenous Every 24 hours 07/30/16 0759     07/30/16 0800  vancomycin (VANCOCIN) IVPB 1000 mg/200 mL premix  Status:  Discontinued     1,000 mg 200 mL/hr over  60 Minutes Intravenous  Once 07/30/16 0753 07/30/16 0759   07/30/16 0800  vancomycin (VANCOCIN) 2,000 mg in sodium chloride 0.9 % 500 mL IVPB     2,000 mg 250 mL/hr over 120 Minutes Intravenous  Once 07/30/16 0759 07/30/16 1053       Radiology Studies: Dg Wrist Complete Left  Result Date: 07/30/2016 CLINICAL DATA:  Swelling and bulging wound along the central posterior wrist EXAM: LEFT WRIST - COMPLETE 3+ VIEW COMPARISON:  06/06/2008 FINDINGS: No acute fracture or dislocation. Moderate osteoarthritis of the radiocarpal joint. Mild osteoarthritis of the scaphotrapeziotrapezoid joint. Mild osteoarthritis of the first [place] joint. Moderate osteoarthritis of the first [place] joint. Periarticular erosions involving the first metacarpal head as can be seen with gout. Soft tissue swelling along the dorsal aspect the hand which may represent a small hematoma given the patient's history. Peripheral vascular atherosclerotic disease. IMPRESSION: 1.  No acute osseous injury of the left hand. 2. Periarticular erosions involving the first metacarpal head as can be seen with gout. Electronically Signed   By: Kathreen Devoid   On: 07/30/2016 09:39        Scheduled Meds: . allopurinol  200 mg Oral Daily  . amiodarone  200 mg Oral Daily  . apixaban  5 mg Oral BID  . aspirin  81 mg Oral Daily  . atorvastatin  40 mg Oral q1800  . clopidogrel  75 mg Oral Q breakfast  . diltiazem  180 mg Oral Daily  . fenofibrate  160 mg Oral Daily  . insulin aspart  0-15 Units Subcutaneous TID WC  . insulin aspart  0-5 Units Subcutaneous QHS  . insulin glargine  50 Units Subcutaneous Daily  . levothyroxine  150 mcg Oral QAC breakfast  . metoprolol  62.5 mg Oral BID  . sodium chloride flush  3 mL Intravenous Q12H  . [START ON 07/31/2016] vancomycin  1,250 mg Intravenous Q24H   Continuous Infusions: . sodium chloride 75 mL/hr at 07/30/16 1120     LOS: 3 days    Time spent:  12 min    [place], DO [facility] Pager [phone]  If 7PM-7AM, please contact night-coverage [email or web address] Password [email or web address] 07/30/2016, 1:02 PM

## 2016-07-30 NOTE — Consult Note (Signed)
   [facility] CM Inpatient Consult   07/30/2016  Michael [name] Oct 24, 1940 [id number]    Went to bedside to speak with patient and wife at bedside to discuss [place] Management program per inpatient [facility] request. [name] was resting. Explained services to Mrs. Roeder who indicates she handles [name] affairs regarding medications and the like. States she does not think [name] needs [facility] Care Management at this time. Provided [facility] Care Management brochure and contact information to call in future should they change their mind. Made inpatient RNCM aware that [place] Management program services were declined.    Marthenia Rolling, MSN-Ed, RN,BSN [facility] Liaison [phone]

## 2016-07-30 NOTE — Progress Notes (Signed)
CARDIAC REHAB PHASE I   Per RN, pt confused, vomiting, initiating antibiotics. Will hold today with plans to follow up tomorrow.   Lenna Sciara, RN, BSN 07/30/2016 8:50 AM

## 2016-07-30 NOTE — Progress Notes (Signed)
Patient Name: Michael [name] Date of Encounter: 07/30/2016  Primary Cardiologist: Dr. Debbe Odea Problem List     Principal Problem:   Chest pain Active Problems:   Pancytopenia ([place])   DM (diabetes mellitus), type 2 with peripheral vascular complications (HCC)   PAF (paroxysmal atrial fibrillation) (HCC)   Essential hypertension   Cholelithiasis   Hypothyroidism   Acute renal failure superimposed on stage 3 chronic kidney disease (HCC)   Hyperkalemia   GIB (gastrointestinal bleeding)   Chronic diastolic CHF (congestive heart failure) ([place])   Nonrheumatic aortic valve stenosis   NSTEMI (non-ST elevated myocardial infarction) ([place])     Subjective   Feels ok this morning, having pain in his left arm from infected site.   Inpatient Medications    Scheduled Meds: . allopurinol  200 mg Oral Daily  . amiodarone  200 mg Oral Daily  . aspirin  81 mg Oral Daily  . atorvastatin  40 mg Oral q1800  . clopidogrel  75 mg Oral Q breakfast  . diltiazem  180 mg Oral Daily  . fenofibrate  160 mg Oral Daily  . insulin aspart  0-15 Units Subcutaneous TID WC  . insulin aspart  0-5 Units Subcutaneous QHS  . insulin glargine  50 Units Subcutaneous Daily  . levothyroxine  150 mcg Oral QAC breakfast  . metoprolol  50 mg Oral BID  . sodium chloride flush  3 mL Intravenous Q12H  . [START ON 07/31/2016] vancomycin  1,250 mg Intravenous Q24H  . vancomycin  2,000 mg Intravenous Once   Continuous Infusions: . heparin 2,200 Units/hr (07/30/16 0600)   PRN Meds: sodium chloride, acetaminophen, hydrALAZINE, HYDROcodone-acetaminophen, meclizine, nitroGLYCERIN, ondansetron (ZOFRAN) IV, sodium chloride flush   Vital Signs    Vitals:   07/29/16 2100 07/29/16 2200 07/29/16 2300 07/30/16 0700  BP: (!) 177/60  (!) 160/42 (!) 172/62  Pulse:    (!) 101  Resp: 20 (!) 24 17 (!) 24  Temp:  98.8 F (37.1 C)  (!) 103.1 F (39.5 C)  TempSrc:  Oral  Oral  SpO2: 96%   93%  Weight:    260 lb 2.3  oz (118 kg)  Height:        Intake/Output Summary (Last 24 hours) at 07/30/16 0821 Last data filed at 07/30/16 0600  Gross per 24 hour  Intake           922.97 ml  Output              200 ml  Net           722.97 ml   Filed Weights   07/28/16 0440 07/29/16 0423 07/30/16 0700  Weight: 259 lb 4.8 oz (117.6 kg) 259 lb 11.2 oz (117.8 kg) 260 lb 2.3 oz (118 kg)    Physical Exam    GEN: Well nourished, well developed, in no acute distress.  HEENT: Grossly normal.  Neck: Supple, no JVD, carotid bruits, or masses. Cardiac: RRR, no murmurs, rubs, or gallops. No clubbing, cyanosis, edema.  Radials/DP/PT 2+ and equal bilaterally.  Respiratory:  Respirations regular and unlabored, clear to auscultation bilaterally. GI: Soft, nontender, nondistended, BS + x 4. MS: no deformity or atrophy. Skin: warm and dry, no rash. Neuro:  Strength and sensation are intact. Psych: AAOx3.  Normal affect.  Labs    CBC  Recent Labs  07/29/16 0122 07/30/16 0346  WBC 6.0 8.5  HGB 12.3* 10.8*  HCT 34.9* 32.8*  MCV 85.5 88.6  PLT  141* 078*   Basic Metabolic Panel  Recent Labs  07/29/16 0122 07/30/16 0346  NA 135 134*  K 4.0 4.0  CL 102 103  CO2 25 18*  GLUCOSE 225* 250*  BUN 22* 29*  CREATININE 1.56* 2.03*  CALCIUM 8.9 8.4*   Cardiac Enzymes  Recent Labs  07/28/16 1417 07/28/16 1819 07/29/16 0122  TROPONINI 1.88* 1.58* 1.78*     Telemetry    NSR- Personally Reviewed    Radiology    No results found.  Cardiac Studies   Coronary Stent Intervention  Right/Left Heart Cath and Coronary Angiography 07/30/15    There is mild aortic valve stenosis.   Conclusions: 1. 80%, hazy stenosis involving proximal OM1, most likely representing culprit lesion. 2. Stable appearance of 60% D1 stenosis. 3. Mild to moderate non-obstructive CAD involving the LAD, AV-groove LCx, and RCA. 4. Mildly elevated left and right heart filling pressures. 5. Mild pulmonary hypertension. 6. Low  normal Fick cardiac output. 7. Mild aortic stenosis. 8. Successful PCI to OM1 with placement of a Synergy 2.25 x 38 mm drug-eluting stent (post-dilated with 2.5 mm Harrod balloon) with 0% residual stenosis and TIMI-3 flow.  Recommendations: 1. Triple therapy with aspirin, clopidogrel, and apixaban x 1 month, after which time discontinuation of aspirin can be considered. 2. Aggressive secondary prevention. 3. Gentle hydration this afternoon, given CKD and mildly elevated left and right heart pressures. 4. Remove right femoral venous and arterial sheaths 2 hours after discontinuation of bivalirudin.    Patient Profile   [name] is a 76 year old male with a past medical history of non obstructive CAD, PAF on eliquis, prior hx of GI bleed due to AVM requiring blood transfusion, CKD stage III, HTN, HLD, IDA, DM, bilateral carotid stenosisand right subclavian artery stenosis. He was admitted on 07/27/16 with SOB and chest pain.   Assessment & Plan    1. NSTEMI: S/p successful stenting to OM1. Will be on triple therapy with ASA+ Plavix+ Apixaban for one month, then will stop ASA.   He is chest pain free.   Will continue high intensity statin and fenofibrate. Will continue metoprolol.   2. Chronic abdominal pain with cholelithiasis Will defer further management per primary.  3. PAF Maintaining sinus rhythm. CHADSVASc score of 5. Continue amiodarone and Eliquisfor anticoagulation.  4. Bilateral carotid stenosis - Most recently evaluated in May 2017. Repeat study in one year.  5.Right subclavian stenosis - Appears to be asymptomatic. Continue medical therapy. Followed by Dr. Fletcher Anon.  6. CKD stage II - Baseline creatinine around 1.4-1.5.   7. Acute on chronic anemia with pancytopenia - Hgb 12.8. History of GI bleed requiring blood transfusion. Stool guaiac negative this admission.  8. Left wrist abscess: Starting IV antibiotics per primary team.    Signed, Arbutus Leas, NP    07/30/2016, 8:21 AM   Patient seen and examined. Agree with assessment and plan. Feels better; no recurrent chest pain. Cath findings reviewed; moderate AS; s/p Synergy DES stent to OM vessel with plan for 1 month of triple dug therapy, then dc ASA and continue plavix/eliquis.  HR 103;  Will increase metorpolol to 62.5 mg bid. Cr increased today to 2.03; continue hydration.   Troy Sine, MD, [facility] 07/30/2016 10:45 AM

## 2016-07-30 NOTE — Progress Notes (Signed)
Pharmacy Antibiotic Note  Michael [name] is a 76 y.o. male admitted on 07/27/2016, now with cellulitis.  Pharmacy has been consulted for vancomycin dosing.  Plan: Vancomycin 2000mg  x1 then 1250mg  IV every 24 hours.  Goal trough 10-15 mcg/mL.  Height: 5\' 11"  (180.3 cm) Weight: 260 lb 2.3 oz (118 kg) IBW/kg (Calculated) : 75.3  Temp (24hrs), Avg:100.1 F (37.8 C), Min:97.7 F (36.5 C), Max:103.1 F (39.5 C)   Recent Labs Lab 07/27/16 0156 07/28/16 0258 07/29/16 0122 07/30/16 0346  WBC 2.8* 5.4 6.0 8.5  CREATININE 1.74* 1.49* 1.56* 2.03*    Estimated Creatinine Clearance: 42.4 mL/min (by C-G formula based on SCr of 2.03 mg/dL (H)).    Allergies  Allergen Reactions  . Percocet [Oxycodone-Acetaminophen] Nausea And Vomiting  . Invokana [Canagliflozin]     Side effects  . Metformin And Related     Upset stomach     Thank you for allowing pharmacy to be a part of this patient's care.  Wynona Neat, PharmD, BCPS  07/30/2016 7:57 AM

## 2016-07-30 NOTE — Progress Notes (Addendum)
PHARMACY - PHYSICIAN COMMUNICATION CRITICAL VALUE ALERT - BLOOD CULTURE IDENTIFICATION (BCID)  Results for orders placed or performed during the hospital encounter of 07/27/16  Blood Culture ID Panel (Reflexed) (Collected: 07/30/2016  7:54 AM)  Result Value Ref Range   Enterococcus species NOT DETECTED NOT DETECTED   Listeria monocytogenes NOT DETECTED NOT DETECTED   Staphylococcus species DETECTED (A) NOT DETECTED   Staphylococcus aureus DETECTED (A) NOT DETECTED   Methicillin resistance NOT DETECTED NOT DETECTED   Streptococcus species NOT DETECTED NOT DETECTED   Streptococcus agalactiae NOT DETECTED NOT DETECTED   Streptococcus pneumoniae NOT DETECTED NOT DETECTED   Streptococcus pyogenes NOT DETECTED NOT DETECTED   Acinetobacter baumannii NOT DETECTED NOT DETECTED   Enterobacteriaceae species NOT DETECTED NOT DETECTED   Enterobacter cloacae complex NOT DETECTED NOT DETECTED   Escherichia coli NOT DETECTED NOT DETECTED   Klebsiella oxytoca NOT DETECTED NOT DETECTED   Klebsiella pneumoniae NOT DETECTED NOT DETECTED   Proteus species NOT DETECTED NOT DETECTED   Serratia marcescens NOT DETECTED NOT DETECTED   Haemophilus influenzae NOT DETECTED NOT DETECTED   Neisseria meningitidis NOT DETECTED NOT DETECTED   Pseudomonas aeruginosa NOT DETECTED NOT DETECTED   Candida albicans NOT DETECTED NOT DETECTED   Candida glabrata NOT DETECTED NOT DETECTED   Candida krusei NOT DETECTED NOT DETECTED   Candida parapsilosis NOT DETECTED NOT DETECTED   Candida tropicalis NOT DETECTED NOT DETECTED    Name of physician (or Provider) Contacted: Tylene Fantasia  Changes to prescribed antibiotics required:  Add Ancef 2g IV q8h for better coverage of MSSA bacteremia. Follow-up renal function to adjust dose as needed.    Brain Hilts 07/30/2016  10:29 PM

## 2016-07-30 NOTE — Progress Notes (Signed)
Inpatient Diabetes Program Recommendations  AACE/ADA: New Consensus Statement on Inpatient Glycemic Control (2015)  Target Ranges:  Prepandial:   less than 140 mg/dL      Peak postprandial:   less than 180 mg/dL (1-2 hours)      Critically ill patients:  140 - 180 mg/dL   Lab Results  Component Value Date   GLUCAP 225 (H) 07/30/2016   HGBA1C 7.1 (H) 07/27/2016    Review of Glycemic Control:  Results for YOVANNI, FRENETTE (MRN [id number]) as of 07/30/2016 12:49  Ref. Range 07/29/2016 06:20 07/29/2016 16:34 07/29/2016 22:14 07/30/2016 06:58 07/30/2016 12:41  Glucose-Capillary Latest Ref Range: 65 - 99 mg/dL 261 (H) 211 (H) 318 (H) 276 (H) 225 (H)   Diabetes history: Type 2 diabetes Outpatient Diabetes medications: Glipizide 10 mg daily, Toujeo 52 units daily Current orders for Inpatient glycemic control:  Novolog moderate tid with meals and HS, Lantus 50 units daily Inpatient Diabetes Program Recommendations:    Please consider increasing Lantus to 60 units daily.    Thanks, Adah Perl, RN, BC-ADM Inpatient Diabetes Coordinator Pager [phone] (8a-5p)

## 2016-07-30 NOTE — Progress Notes (Signed)
[place] for heparin (apixaban PTA) Indication: atrial fibrillation  Allergies  Allergen Reactions  . Percocet [Oxycodone-Acetaminophen] Nausea And Vomiting  . Invokana [Canagliflozin]     Side effects  . Metformin And Related     Upset stomach    Patient Measurements: Height: 5\' 11"  (180.3 cm) Weight: 260 lb 2.3 oz (118 kg) IBW/kg (Calculated) : 75.3 Heparin Dosing Weight: 101 kg   Vital Signs: Temp: 103 F (39.4 C) (01/04 0855) Temp Source: Oral (01/04 0855) BP: 170/46 (01/04 0855) Pulse Rate: 93 (01/04 0855)  Labs:  Recent Labs  07/28/16 0258  07/28/16 1417 07/28/16 1819 07/29/16 0122 07/29/16 0854 07/30/16 0346 07/30/16 0751  HGB 11.8*  --   --   --  12.3*  --  10.8*  --   HCT 33.8*  --   --   --  34.9*  --  32.8*  --   PLT 129*  --   --   --  141*  --  100*  --   APTT  --   < >  --  65* 56* 92*  --   --   LABPROT  --   --   --   --  13.3  --   --   --   INR  --   --   --   --  1.01  --   --   --   HEPARINUNFRC  --   < >  --   --  0.31 0.47  --  0.55  CREATININE 1.49*  --   --   --  1.56*  --  2.03*  --   TROPONINI  --   --  1.88* 1.58* 1.78*  --   --   --   < > = values in this interval not displayed.  Estimated Creatinine Clearance: 42.4 mL/min (by C-G formula based on SCr of 2.03 mg/dL (H)).   Assessment: 76 yo M admitted for r/o ACS, troponins trending up. On apixaban prior to admission for atrial fibrillation. Apixaban on hold, with last dose 12/31. Patient is s/p cath with PCI to OM1 with placement of DES. Apixiban to restart today (noted plans for triple therapy with aspirin, clopidogrel, and apixaban x 1 month; then d/c aspirin).   Goal of Therapy:  Heparin level 0.3-0.7 units/ml Monitor platelets by anticoagulation protocol: Yes   Plan:  -restart apixiban 5mg  po bid -discontinue heparin  Hildred Laser, Pharm D 07/30/2016 11:19 AM

## 2016-07-31 DIAGNOSIS — I10 Essential (primary) hypertension: Secondary | ICD-10-CM

## 2016-07-31 DIAGNOSIS — Z87891 Personal history of nicotine dependence: Secondary | ICD-10-CM

## 2016-07-31 DIAGNOSIS — R112 Nausea with vomiting, unspecified: Secondary | ICD-10-CM

## 2016-07-31 DIAGNOSIS — Z8619 Personal history of other infectious and parasitic diseases: Secondary | ICD-10-CM

## 2016-07-31 DIAGNOSIS — Z823 Family history of stroke: Secondary | ICD-10-CM

## 2016-07-31 DIAGNOSIS — Z955 Presence of coronary angioplasty implant and graft: Secondary | ICD-10-CM

## 2016-07-31 DIAGNOSIS — I2510 Atherosclerotic heart disease of native coronary artery without angina pectoris: Secondary | ICD-10-CM

## 2016-07-31 DIAGNOSIS — Z825 Family history of asthma and other chronic lower respiratory diseases: Secondary | ICD-10-CM

## 2016-07-31 DIAGNOSIS — I739 Peripheral vascular disease, unspecified: Secondary | ICD-10-CM

## 2016-07-31 DIAGNOSIS — I6523 Occlusion and stenosis of bilateral carotid arteries: Secondary | ICD-10-CM

## 2016-07-31 DIAGNOSIS — I214 Non-ST elevation (NSTEMI) myocardial infarction: Principal | ICD-10-CM

## 2016-07-31 DIAGNOSIS — Z885 Allergy status to narcotic agent status: Secondary | ICD-10-CM

## 2016-07-31 DIAGNOSIS — Z888 Allergy status to other drugs, medicaments and biological substances status: Secondary | ICD-10-CM

## 2016-07-31 DIAGNOSIS — I808 Phlebitis and thrombophlebitis of other sites: Secondary | ICD-10-CM | POA: Diagnosis present

## 2016-07-31 DIAGNOSIS — R1011 Right upper quadrant pain: Secondary | ICD-10-CM

## 2016-07-31 DIAGNOSIS — R197 Diarrhea, unspecified: Secondary | ICD-10-CM

## 2016-07-31 DIAGNOSIS — Z801 Family history of malignant neoplasm of trachea, bronchus and lung: Secondary | ICD-10-CM

## 2016-07-31 DIAGNOSIS — Z800 Family history of malignant neoplasm of digestive organs: Secondary | ICD-10-CM

## 2016-07-31 DIAGNOSIS — Z7901 Long term (current) use of anticoagulants: Secondary | ICD-10-CM

## 2016-07-31 DIAGNOSIS — Z833 Family history of diabetes mellitus: Secondary | ICD-10-CM

## 2016-07-31 DIAGNOSIS — T8172XA Complication of vein following a procedure, not elsewhere classified, initial encounter: Secondary | ICD-10-CM

## 2016-07-31 DIAGNOSIS — R7881 Bacteremia: Secondary | ICD-10-CM

## 2016-07-31 DIAGNOSIS — Z8249 Family history of ischemic heart disease and other diseases of the circulatory system: Secondary | ICD-10-CM

## 2016-07-31 HISTORY — DX: Peripheral vascular disease, unspecified: I73.9

## 2016-07-31 HISTORY — DX: Atherosclerotic heart disease of native coronary artery without angina pectoris: I25.10

## 2016-07-31 LAB — GLUCOSE, CAPILLARY
GLUCOSE-CAPILLARY: 210 mg/dL — AB (ref 65–99)
GLUCOSE-CAPILLARY: 192 mg/dL — AB (ref 65–99)
GLUCOSE-CAPILLARY: 200 mg/dL — AB (ref 65–99)
Glucose-Capillary: 206 mg/dL — ABNORMAL HIGH (ref 65–99)

## 2016-07-31 LAB — BASIC METABOLIC PANEL
Anion gap: 8 (ref 5–15)
BUN: 32 mg/dL — AB (ref 6–20)
CHLORIDE: 102 mmol/L (ref 101–111)
CO2: 21 mmol/L — ABNORMAL LOW (ref 22–32)
Calcium: 7.5 mg/dL — ABNORMAL LOW (ref 8.9–10.3)
Creatinine, Ser: 2.02 mg/dL — ABNORMAL HIGH (ref 0.61–1.24)
GFR calc Af Amer: 36 mL/min — ABNORMAL LOW (ref >60)
GFR calc non Af Amer: 31 mL/min — ABNORMAL LOW (ref >60)
GLUCOSE: 204 mg/dL — AB (ref 65–99)
POTASSIUM: 3.6 mmol/L (ref 3.5–5.1)
SODIUM: 131 mmol/L — AB (ref 135–145)

## 2016-07-31 MED ORDER — VANCOMYCIN 50 MG/ML ORAL SOLUTION
125.0000 mg | Freq: Four times a day (QID) | ORAL | Status: DC
  Administered 2016-07-31 – 2016-08-04 (×18): 125 mg via ORAL
  Filled 2016-07-31 (×19): qty 2.5

## 2016-07-31 MED ORDER — METOPROLOL TARTRATE 25 MG PO TABS
75.0000 mg | ORAL_TABLET | Freq: Two times a day (BID) | ORAL | Status: DC
  Administered 2016-07-31 – 2016-08-04 (×8): 75 mg via ORAL
  Filled 2016-07-31 (×8): qty 1

## 2016-07-31 MED ORDER — CEFAZOLIN SODIUM-DEXTROSE 2-4 GM/100ML-% IV SOLN
2.0000 g | Freq: Three times a day (TID) | INTRAVENOUS | Status: DC
  Administered 2016-07-31 – 2016-08-04 (×14): 2 g via INTRAVENOUS
  Filled 2016-07-31 (×15): qty 100

## 2016-07-31 MED ORDER — HYDROCODONE-ACETAMINOPHEN 5-325 MG PO TABS
1.0000 | ORAL_TABLET | Freq: Four times a day (QID) | ORAL | Status: DC | PRN
  Administered 2016-07-31 – 2016-08-04 (×7): 2 via ORAL
  Filled 2016-07-31 (×3): qty 2
  Filled 2016-07-31: qty 1
  Filled 2016-07-31 (×3): qty 2

## 2016-07-31 NOTE — Progress Notes (Signed)
CARDIAC REHAB PHASE I   Discussed MI, stent, restrictions, diet with wife and pt. Also discussed CRPII and will send referral to [facility]. Pt in process of urgently moving rooms so I did not finish education. RN suggested not walking today. ? Needs PT c/s. Will f/u tomorrow. [phone]  [place], ACSM 07/31/2016 10:42 AM

## 2016-07-31 NOTE — Progress Notes (Signed)
Patient Name: Michael [name] Date of Encounter: 07/31/2016  Primary Cardiologist: Dr. Debbe Odea Problem List     Principal Problem:   Chest pain Active Problems:   Pancytopenia ([place])   DM (diabetes mellitus), type 2 with peripheral vascular complications (HCC)   PAF (paroxysmal atrial fibrillation) (HCC)   Essential hypertension   Cholelithiasis   Hypothyroidism   Acute renal failure superimposed on stage 3 chronic kidney disease (HCC)   Hyperkalemia   GIB (gastrointestinal bleeding)   Chronic diastolic CHF (congestive heart failure) (HCC)   Nonrheumatic aortic valve stenosis   NSTEMI (non-ST elevated myocardial infarction) ([place])   Fever     Subjective   Denies chest pain, left wrist is tender and erythematous.   Inpatient Medications    Scheduled Meds: . allopurinol  200 mg Oral Daily  . amiodarone  200 mg Oral Daily  . apixaban  5 mg Oral BID  . aspirin  81 mg Oral Daily  . atorvastatin  40 mg Oral q1800  .  ceFAZolin (ANCEF) IV  2 g Intravenous Q8H  . clopidogrel  75 mg Oral Q breakfast  . diltiazem  180 mg Oral Daily  . fenofibrate  160 mg Oral Daily  . insulin aspart  0-15 Units Subcutaneous TID WC  . insulin aspart  0-5 Units Subcutaneous QHS  . insulin glargine  60 Units Subcutaneous Daily  . levothyroxine  150 mcg Oral QAC breakfast  . metoprolol  62.5 mg Oral BID  . sodium chloride flush  3 mL Intravenous Q12H  . vancomycin  1,250 mg Intravenous Q24H   Continuous Infusions: . sodium chloride 75 mL/hr at 07/31/16 0700   PRN Meds: sodium chloride, acetaminophen, hydrALAZINE, HYDROcodone-acetaminophen, meclizine, nitroGLYCERIN, ondansetron (ZOFRAN) IV, sodium chloride flush   Vital Signs    Vitals:   07/31/16 0300 07/31/16 0355 07/31/16 0600 07/31/16 0816  BP:  (!) 165/53  (!) 170/61  Pulse: 87 86 76 82  Resp: 15 18 13 13   Temp: 100.1 F (37.8 C)  98.8 F (37.1 C) 98.8 F (37.1 C)  TempSrc: Oral  Oral Oral  SpO2: 94% 99% 97% 97%    Weight:  261 lb 0.4 oz (118.4 kg)    Height:        Intake/Output Summary (Last 24 hours) at 07/31/16 0856 Last data filed at 07/31/16 0700  Gross per 24 hour  Intake             1955 ml  Output               50 ml  Net             1905 ml   Filed Weights   07/29/16 0423 07/30/16 0700 07/31/16 0355  Weight: 259 lb 11.2 oz (117.8 kg) 260 lb 2.3 oz (118 kg) 261 lb 0.4 oz (118.4 kg)    Physical Exam   GEN: Well nourished, well developed, in no acute distress.  HEENT: Grossly normal.  Neck: Supple, no JVD, carotid bruits, or masses. Cardiac: RRR, no murmurs, rubs, or gallops. No clubbing, cyanosis, edema.  Radials/DP/PT 2+ and equal bilaterally.  Respiratory:  Respirations regular and unlabored, clear to auscultation bilaterally. GI: Soft, nontender, nondistended, BS + x 4. MS: no deformity or atrophy. Skin: warm and dry, no rash. Neuro:  Strength and sensation are intact. Psych: AAOx3.  Normal affect.  Labs    CBC  Recent Labs  07/29/16 0122 07/30/16 0346  WBC 6.0 8.5  HGB  12.3* 10.8*  HCT 34.9* 32.8*  MCV 85.5 88.6  PLT 141* 563*   Basic Metabolic Panel  Recent Labs  07/30/16 0346 07/31/16 0455  NA 134* 131*  K 4.0 3.6  CL 103 102  CO2 18* 21*  GLUCOSE 250* 204*  BUN 29* 32*  CREATININE 2.03* 2.02*  CALCIUM 8.4* 7.5*   Liver Function Tests  Recent Labs  07/30/16 0751  AST 40  ALT 24  ALKPHOS 36*  BILITOT 1.3*  PROT 6.7  ALBUMIN 3.5   Cardiac Enzymes  Recent Labs  07/28/16 1417 07/28/16 1819 07/29/16 0122  TROPONINI 1.88* 1.58* 1.78*   Lipid Panel     Component Value Date/Time   CHOL 100 03/20/2015 1003   TRIG (H) 03/20/2015 1003    795.0 Triglyceride is over 400; calculations on Lipids are invalid.   HDL 22.30 (L) 03/20/2015 1003   CHOLHDL 4 03/20/2015 1003   VLDL UNABLE TO CALCULATE IF TRIGLYCERIDE OVER 400 mg/dL 10/12/2014 0248   LDLCALC UNABLE TO CALCULATE IF TRIGLYCERIDE OVER 400 mg/dL 10/12/2014 0248   LDLDIRECT 26.0  03/20/2015 1003    Telemetry     NSR- Personally Reviewed  ECG    Sinus tach, inferior ST changes. - Personally Reviewed  Radiology    Dg Wrist Complete Left  Result Date: 07/30/2016 CLINICAL DATA:  Swelling and bulging wound along the central posterior wrist EXAM: LEFT WRIST - COMPLETE 3+ VIEW COMPARISON:  06/06/2008 FINDINGS: No acute fracture or dislocation. Moderate osteoarthritis of the radiocarpal joint. Mild osteoarthritis of the scaphotrapeziotrapezoid joint. Mild osteoarthritis of the first [place] joint. Moderate osteoarthritis of the first [place] joint. Periarticular erosions involving the first metacarpal head as can be seen with gout. Soft tissue swelling along the dorsal aspect the hand which may represent a small hematoma given the patient's history. Peripheral vascular atherosclerotic disease. IMPRESSION: 1.  No acute osseous injury of the left hand. 2. Periarticular erosions involving the first metacarpal head as can be seen with gout. Electronically Signed   By: Kathreen Devoid   On: 07/30/2016 09:39   Dg Chest Port 1 View  Result Date: 07/30/2016 CLINICAL DATA:  Increasing shortness of Breath EXAM: PORTABLE CHEST 1 VIEW COMPARISON:  07/27/2016 FINDINGS: Cardiac shadow remains enlarged. The lungs are again well aerated without focal infiltrate. Very mild vascular congestion is seen without interstitial edema. No bony abnormality is noted. IMPRESSION: Mild vascular prominence without interstitial edema. Electronically Signed   By: Inez Catalina M.D.   On: 07/30/2016 13:58    Cardiac Studies   Coronary Stent Intervention  Right/Left Heart Cath and Coronary Angiography  Conclusions: 1. 80%, hazy stenosis involving proximal OM1, most likely representing culprit lesion. 2. Stable appearance of 60% D1 stenosis. 3. Mild to moderate non-obstructive CAD involving the LAD, AV-groove LCx, and RCA. 4. Mildly elevated left and right heart filling pressures. 5. Mild pulmonary  hypertension. 6. Low normal Fick cardiac output. 7. Mild aortic stenosis. 8. Successful PCI to OM1 with placement of a Synergy 2.25 x 38 mm drug-eluting stent (post-dilated with 2.5 mm [place] balloon) with 0% residual stenosis and TIMI-3 flow.  Recommendations: 1. Triple therapy with aspirin, clopidogrel, and apixaban x 1 month, after which time discontinuation of aspirin can be considered. 2. Aggressive secondary prevention. 3. Gentle hydration this afternoon, given CKD and mildly elevated left and right heart pressures. 4. Remove right femoral venous and arterial sheaths 2 hours after discontinuation of bivalirudin.    Patient Profile     Mr. Goodenow is  a 76 year old male with a past medical history of non obstructive CAD, PAF on eliquis, prior hx of GI bleed due to AVM requiring blood transfusion, CKD stage III, HTN, HLD, IDA, DM, bilateral carotid stenosisand right subclavian artery stenosis. He was admitted on 07/27/16 with SOB and chest pain.   Assessment & Plan    1. NSTEMI: S/p successful stenting to OM1. Will be on triple therapy with ASA+ Plavix+ Apixaban for one month, then will stop ASA.   He is chest pain free.   Will continue high intensity statin and fenofibrate. Will continue metoprolol.   2. Chronic abdominal pain with cholelithiasis Will defer further management per primary.  3. PAF Maintaining sinus rhythm. CHADSVASc score of 5. Continue amiodarone and Eliquisfor anticoagulation.  4. Bilateral carotid stenosis - Most recently evaluated in May 2017. Repeat study in one year.  5.Right subclavian stenosis - Appears to be asymptomatic. Continue medical therapy. Followed by Dr. Fletcher Anon.  6. CKD stage II - Baseline creatinine around 1.4-1.5.   7. Acute on chronic anemia with pancytopenia - Hgb 12.8. History of GI bleed requiring blood transfusion. Stool guaiac negative this admission.  8. Left wrist abscess: Starting IV antibiotics per primary team.    9. Tachycardia: Metoprolol increased to 62.5 mg BID, rates better controlled.   Signed, Arbutus Leas, NP  07/31/2016, 8:56 AM    Patient seen and examined. Agree with assessment and plan. Feels well; no recurrent cp since PCI.. Cr slightly improved today. BP increased; will further titrate metoprolol to 75 mg bid. Will f/u lipid panel with previous significant TG elevation on statin/fenofibrate; if TG still elevated add vascepa or lovaza 2 capsules bid  Troy Sine, MD, [facility] 07/31/2016 11:20 AM

## 2016-07-31 NOTE — Consult Note (Signed)
[place] for Infectious Disease    Date of Admission:  07/27/2016           Day day 1 cefazolin       Reason for Consult: Automatic consultation for staph aureus bacteremia      Principal Problem:   Staphylococcus aureus bacteremia Active Problems:   Septic thrombophlebitis of upper extremity   History of Clostridium difficile colitis   Pancytopenia ([place])   DM (diabetes mellitus), type 2 with peripheral vascular complications (HCC)   Angiodysplasia of intestinal tract   Hypertriglyceridemia   Carotid stenosis   PAF (paroxysmal atrial fibrillation) (HCC)   Hyperlipidemia   Essential hypertension   Cholelithiasis   Hypothyroidism   Hyperkalemia   Chronic diastolic CHF (congestive heart failure) (HCC)   Nonrheumatic aortic valve stenosis   NSTEMI (non-ST elevated myocardial infarction) ([place])   CAD (coronary artery disease)   PAD (peripheral artery disease) ([place])   . allopurinol  200 mg Oral Daily  . amiodarone  200 mg Oral Daily  . apixaban  5 mg Oral BID  . aspirin  81 mg Oral Daily  . atorvastatin  40 mg Oral q1800  .  ceFAZolin (ANCEF) IV  2 g Intravenous Q8H  . clopidogrel  75 mg Oral Q breakfast  . diltiazem  180 mg Oral Daily  . fenofibrate  160 mg Oral Daily  . insulin aspart  0-15 Units Subcutaneous TID WC  . insulin aspart  0-5 Units Subcutaneous QHS  . insulin glargine  60 Units Subcutaneous Daily  . levothyroxine  150 mcg Oral QAC breakfast  . metoprolol  75 mg Oral BID  . sodium chloride flush  3 mL Intravenous Q12H    Recommendations: 1. Continue cefazolin 2. Repeat blood cultures 3. Transthoracic echocardiogram 4. Monitor left arm septic thrombophlebitis closely 5. Hold off on PICC placement until we know blood cultures are negative  6. Stool for C. difficile assay 7. Enteric precautions 8. Start oral vancomycin  Assessment: Mr. Longsworth has developed postoperative thrombophlebitis had a previous IV site in his left arm complicated  by MSSA bacteremia. I will treat with IV cefazolin. If his thrombophlebitis does not improve fairly promptly he could need surgical intervention. The role of anticoagulation in septic thrombophlebitis is uncertain but he is already on apixaban. It is unlikely that he has endocarditis but I will order a transthoracic echocardiogram. I've ordered repeat blood cultures and will hold off on PICC placement until I know that his cultures are negative.  He has a history of C. difficile colitis and has started having diarrhea again. I will check a C. difficile assay and start empiric oral vancomycin.    HPI: Michael [name] is a 76 y.o. male with coronary artery disease who was admitted with chest pain on 07/26/2016. He was found to have a NSTEMI. Several days ago he began to notice swelling of the site of his initial saline lock IV in the dorsum of his left wrist. That IV was placed in the field by EMS. He was removed. Two days ago he began to have high fever and chills. Blood cultures have grown MSSA. Yesterday he underwent cardiac catheterization with PTCA and stenting of an obtuse marginal obstructive lesion.  He had C. difficile colitis last fall and was treated with oral vancomycin. He started having diarrhea again around 3 AM this morning. He has also had some nausea without vomiting today.    Review of Systems: Review  of Systems  Constitutional: Positive for chills, fever and malaise/fatigue. Negative for diaphoresis and weight loss.  HENT: Negative for sore throat.   Respiratory: Negative for cough, sputum production and shortness of breath.   Cardiovascular: Negative for chest pain.  Gastrointestinal: Positive for diarrhea and nausea. Negative for abdominal pain, heartburn and vomiting.  Genitourinary: Negative for dysuria and frequency.  Musculoskeletal: Negative for joint pain and myalgias.  Skin: Negative for rash.  Neurological: Negative for dizziness and headaches.  Psychiatric/Behavioral:  Negative for depression and substance abuse. The patient is not nervous/anxious.     Past Medical History:  Diagnosis Date  . Allergy   . Arthritis   . AVM (arteriovenous malformation) of colon   . CAD (coronary artery disease)    a. Cath 10/2014 - Mild to moderate diagonal, circumflex and obtuse marginal disease. Innominate artery sternosis.  . Carotid artery disease ([place])    a. Carotid dopple 03/2014 18-29% RICA &  93-71% LICA stenosis b. 6/96  . CKD (chronic kidney disease), stage III   . Clostridium difficile colitis 05/13/2016  . Dysrhythmia    ATRIAL FIBRILATION  . Elevated troponin    a. 09/2014 in setting of AF RVR-->Myoview: EF 49%, no ischemia/infarct->Med Rx.  . GERD (gastroesophageal reflux disease)   . Gout   . H/O transfusion of whole blood   . History of PFTs    PFTs 6/16:  FVC 3.73 (87%), FEV1 2.93 (88%), FEV1/FVC 78%, DLCO 69%  . Hx of adenomatous colonic polyps 07/02/2016  . Hyperlipidemia   . Hypertension   . Hypothyroidism   . Iron deficiency anemia   . PAF (paroxysmal atrial fibrillation) ([place])    a. 09/2014: Converted on Dilt;  b. CHA2DS2VASc = 3-->eliquis;  c. 09/2014 Echo: EF 55-60%, mild LVH, mildly dil LA.  [name] Personal history of colonic polyps 2007, 2008   adenoma each time, largest 12 mm in 2007  . Psoriasis   . Type II diabetes mellitus ([place])    TYPE 2    Social History  Substance Use Topics  . Smoking status: Former Smoker    Packs/day: 0.70    Years: 48.00    Types: Cigarettes    Quit date: 03/29/2006  . Smokeless tobacco: Never Used  . Alcohol use 0.6 oz/week    1 Cans of beer per week     Comment: occasional alcohol intake    Family History  Problem Relation Age of Onset  . Heart attack Father   . Lung cancer Father   . Diabetes Father   . Stroke Father   . Hypertension Father   . Stroke Mother   . Hypertension Mother   . Cancer Brother     liver  . Heart disease Brother     chf  . COPD Sister   . Malignant hyperthermia Neg Hx      Allergies  Allergen Reactions  . Percocet [Oxycodone-Acetaminophen] Nausea And Vomiting  . Invokana [Canagliflozin]     Side effects  . Metformin And Related     Upset stomach    OBJECTIVE: Blood pressure (!) 180/51, pulse 90, temperature 98.3 F (36.8 C), temperature source Oral, resp. rate 14, height 5\' 11"  (1.803 m), weight 261 lb 0.4 oz (118.4 kg), SpO2 96 %.  Physical Exam  Constitutional: He is oriented to person, place, and time.  He is a little uncomfortable due to left wrist pain but otherwise is in no distress. His wife is at the bedside.  HENT:  Mouth/Throat: No  oropharyngeal exudate.  Eyes: Conjunctivae are normal.  Cardiovascular: Normal rate and regular rhythm.   No murmur heard. Pulmonary/Chest: Effort normal and breath sounds normal. He has no wheezes. He has no rales.  Abdominal: Soft. He exhibits distension. He exhibits no mass. There is tenderness.  Musculoskeletal:  He has diffuse swelling over the dorsum of the left wrist extending up to mid forearm. There is a hematoma at the site of his recent venipuncture.  Neurological: He is alert and oriented to person, place, and time.  Skin: No rash noted.  Psychiatric: Mood and affect normal.    Lab Results Lab Results  Component Value Date   WBC 8.5 07/30/2016   HGB 10.8 (L) 07/30/2016   HCT 32.8 (L) 07/30/2016   MCV 88.6 07/30/2016   PLT 100 (L) 07/30/2016    Lab Results  Component Value Date   CREATININE 2.02 (H) 07/31/2016   BUN 32 (H) 07/31/2016   NA 131 (L) 07/31/2016   K 3.6 07/31/2016   CL 102 07/31/2016   CO2 21 (L) 07/31/2016    Lab Results  Component Value Date   ALT 24 07/30/2016   AST 40 07/30/2016   ALKPHOS 36 (L) 07/30/2016   BILITOT 1.3 (H) 07/30/2016     Microbiology: Recent Results (from the past 240 hour(s))  Culture, blood (Routine X 2) w Reflex to ID Panel     Status: None (Preliminary result)   Collection Time: 07/30/16  7:51 AM  Result Value Ref Range Status    Specimen Description BLOOD RIGHT HAND  Final   Special Requests IN PEDIATRIC BOTTLE 1CC  Final   Culture  Setup Time   Final    GRAM POSITIVE COCCI IN CLUSTERS AEROBIC BOTTLE ONLY CRITICAL VALUE NOTED.  VALUE IS CONSISTENT WITH PREVIOUSLY REPORTED AND CALLED VALUE.    Culture   Final    GRAM POSITIVE COCCI CULTURE REINCUBATED FOR BETTER GROWTH    Report Status PENDING  Incomplete  Culture, blood (Routine X 2) w Reflex to ID Panel     Status: None (Preliminary result)   Collection Time: 07/30/16  7:54 AM  Result Value Ref Range Status   Specimen Description BLOOD RIGHT HAND  Final   Special Requests BOTTLES DRAWN AEROBIC AND ANAEROBIC 5CC EA  Final   Culture  Setup Time   Final    GRAM POSITIVE COCCI IN CLUSTERS IN BOTH AEROBIC AND ANAEROBIC BOTTLES CRITICAL RESULT CALLED TO, READ BACK BY AND VERIFIED WITH: J MILLEN PHARMD 2206 07/30/16 A BROWNING    Culture   Final    GRAM POSITIVE COCCI CULTURE REINCUBATED FOR BETTER GROWTH    Report Status PENDING  Incomplete  Blood Culture ID Panel (Reflexed)     Status: Abnormal   Collection Time: 07/30/16  7:54 AM  Result Value Ref Range Status   Enterococcus species NOT DETECTED NOT DETECTED Final   Listeria monocytogenes NOT DETECTED NOT DETECTED Final   Staphylococcus species DETECTED (A) NOT DETECTED Final    Comment: CRITICAL RESULT CALLED TO, READ BACK BY AND VERIFIED WITH: J MILLEN PHARMD 2206 07/30/16 A BROWNING    Staphylococcus aureus DETECTED (A) NOT DETECTED Final    Comment: CRITICAL RESULT CALLED TO, READ BACK BY AND VERIFIED WITH: J MILLEN PHARMD 2206 07/30/16 A BROWNING    Methicillin resistance NOT DETECTED NOT DETECTED Final   Streptococcus species NOT DETECTED NOT DETECTED Final   Streptococcus agalactiae NOT DETECTED NOT DETECTED Final   Streptococcus pneumoniae NOT DETECTED NOT DETECTED Final  Streptococcus pyogenes NOT DETECTED NOT DETECTED Final   Acinetobacter baumannii NOT DETECTED NOT DETECTED Final    Enterobacteriaceae species NOT DETECTED NOT DETECTED Final   Enterobacter cloacae complex NOT DETECTED NOT DETECTED Final   Escherichia coli NOT DETECTED NOT DETECTED Final   Klebsiella oxytoca NOT DETECTED NOT DETECTED Final   Klebsiella pneumoniae NOT DETECTED NOT DETECTED Final   Proteus species NOT DETECTED NOT DETECTED Final   Serratia marcescens NOT DETECTED NOT DETECTED Final   Haemophilus influenzae NOT DETECTED NOT DETECTED Final   Neisseria meningitidis NOT DETECTED NOT DETECTED Final   Pseudomonas aeruginosa NOT DETECTED NOT DETECTED Final   Candida albicans NOT DETECTED NOT DETECTED Final   Candida glabrata NOT DETECTED NOT DETECTED Final   Candida krusei NOT DETECTED NOT DETECTED Final   Candida parapsilosis NOT DETECTED NOT DETECTED Final   Candida tropicalis NOT DETECTED NOT DETECTED Final  MRSA PCR Screening     Status: None   Collection Time: 07/30/16 10:25 AM  Result Value Ref Range Status   MRSA by PCR NEGATIVE NEGATIVE Final    Comment:        The GeneXpert MRSA Assay (FDA approved for NASAL specimens only), is one component of a comprehensive MRSA colonization surveillance program. It is not intended to diagnose MRSA infection nor to guide or monitor treatment for MRSA infections.     Michel Bickers, MD [facility] [phone] pager   [phone] cell 07/31/2016, 11:40 AM

## 2016-07-31 NOTE — Progress Notes (Signed)
PROGRESS NOTE    Michael [name]  GHW:[id number] DOB: 12/05/1940 DOA: 07/27/2016 PCP: [name], MD    Brief Narrative: 76 y.o. male with medical history significant for insulin-dependent diabetes mellitus, hypertension, coronary artery disease, chronic kidney disease stage III, paroxysmal atrial fibrillation on Eliquis, and reported small bowel AVMs who presents to the emergency department with chest pain and dyspnea.  Patient ended up having cardiac catheter evaluation. Subsequently developed fevers and was found to have cellulitis of left wrist as well as Staphylococcus aureus bacteremia.   Assessment & Plan:   Principal Problem:   Staphylococcus aureus bacteremia - Patient placed on IV antibiotics continue Ancef and vancomycin - continue current regimen.  Active Problems:   Pancytopenia ([place])   DM (diabetes mellitus), type 2 with peripheral vascular complications (HCC) - diabetic diet, SSI    Angiodysplasia of intestinal tract   Hypertriglyceridemia   Carotid stenosis    PAF (paroxysmal atrial fibrillation) ([place]) - Patient currently on Cardizem and beta blocker. Also on eliquis    Hyperlipidemia - stable on lipitor    Essential hypertension - continue cardizem and metroprolol if BP remains consistently elevated will plan on increasing or adding another antihypertensive medication.    Cholelithiasis - Stable    Hypothyroidism - We'll continue Synthroid, stable no hypothyroidism symptoms reported    Hyperkalemia - Resolved    Chronic diastolic CHF (congestive heart failure) (HCC)   Nonrheumatic aortic valve stenosis/NSTEMI (non-ST elevated myocardial infarction) (HCC)/ CAD (coronary artery disease)/ - Cardiology on board and managing medication recommendations. Of note cardiology recommending aspirin plus Plavix plus apixaban for 1 month then stopping the aspirin     PAD (peripheral artery disease) (HCC)   Septic thrombophlebitis of upper extremity   History  of Clostridium difficile colitis   DVT prophylaxis: pt on Eliquis Code Status: Full Family Communication: d/c patient directly Disposition Plan: pending improvement in condition and resolution of fevers. Pt may require further testing   Consultants:   ID  Cardiology  Procedures: Cardiac cath  Antimicrobials: Cefazolin, Vancomycin   Subjective: Pt has no new complaints no acute issues reported overnight.  Objective: Vitals:   07/31/16 0815 07/31/16 0816 07/31/16 1011 07/31/16 1045  BP: (!) 170/61 (!) 170/61 (!) 199/50 (!) 180/51  Pulse: 80 82 89 90  Resp: 13 13 14 14   Temp:  98.8 F (37.1 C)  98.3 F (36.8 C)  TempSrc:  Oral  Oral  SpO2: 100% 97% 100% 96%  Weight:      Height:        Intake/Output Summary (Last 24 hours) at 07/31/16 1534 Last data filed at 07/31/16 1000  Gross per 24 hour  Intake             1700 ml  Output               50 ml  Net             1650 ml   Filed Weights   07/29/16 0423 07/30/16 0700 07/31/16 0355  Weight: 117.8 kg (259 lb 11.2 oz) 118 kg (260 lb 2.3 oz) 118.4 kg (261 lb 0.4 oz)    Examination:  General exam: Appears calm and comfortable  Respiratory system: Clear to auscultation. Respiratory effort normal. Cardiovascular system: S1 & S2 heard, RRR. No JVD, murmurs, rubs, gallops or clicks. No pedal edema. Gastrointestinal system: Abdomen is nondistended, soft and nontender. No organomegaly or masses felt. Normal bowel sounds heard. Central nervous system: Alert and oriented. No focal  neurological deficits. Extremities: Symmetric 5 x 5 power. Skin: No rashes, lesions or ulcers Psychiatry: Judgement and insight appear normal. Mood & affect appropriate.   Data Reviewed: I have personally reviewed following labs and imaging studies  CBC:  Recent Labs Lab 07/27/16 0156 07/27/16 0957 07/28/16 0258 07/29/16 0122 07/30/16 0346  WBC 2.8*  --  5.4 6.0 8.5  HGB 8.4* 11.8* 11.8* 12.3* 10.8*  HCT 22.7* 32.3* 33.8* 34.9* 32.8*    MCV 83.2  --  84.5 85.5 88.6  PLT 121*  --  129* 141* 916*   Basic Metabolic Panel:  Recent Labs Lab 07/27/16 0156 07/27/16 0957 07/28/16 0258 07/29/16 0122 07/30/16 0346 07/31/16 0455  NA 136  --  134* 135 134* 131*  K 5.2* 4.3 4.0 4.0 4.0 3.6  CL 105  --  102 102 103 102  CO2 19*  --  25 25 18* 21*  GLUCOSE 449*  --  205* 225* 250* 204*  BUN 26*  --  22* 22* 29* 32*  CREATININE 1.74*  --  1.49* 1.56* 2.03* 2.02*  CALCIUM 8.3*  --  8.8* 8.9 8.4* 7.5*   GFR: Estimated Creatinine Clearance: 42.6 mL/min (by C-G formula based on SCr of 2.02 mg/dL (H)). Liver Function Tests:  Recent Labs Lab 07/27/16 0156 07/30/16 0751  AST 60* 40  ALT 22 24  ALKPHOS 33* 36*  BILITOT 1.4* 1.3*  PROT 5.9* 6.7  ALBUMIN 3.5 3.5    Recent Labs Lab 07/27/16 0156  LIPASE 43   No results for input(s): AMMONIA in the last 168 hours. Coagulation Profile:  Recent Labs Lab 07/29/16 0122  INR 1.01   Cardiac Enzymes:  Recent Labs Lab 07/27/16 0957 07/27/16 1641 07/28/16 1417 07/28/16 1819 07/29/16 0122  TROPONINI 0.29* 1.63* 1.88* 1.58* 1.78*   BNP (last 3 results) No results for input(s): PROBNP in the last 8760 hours. HbA1C: No results for input(s): HGBA1C in the last 72 hours. CBG:  Recent Labs Lab 07/30/16 1241 07/30/16 1704 07/30/16 2101 07/31/16 0618 07/31/16 1205  GLUCAP 225* 193* 184* 210* 192*   Lipid Profile: No results for input(s): CHOL, HDL, LDLCALC, TRIG, CHOLHDL, LDLDIRECT in the last 72 hours. Thyroid Function Tests: No results for input(s): TSH, T4TOTAL, FREET4, T3FREE, THYROIDAB in the last 72 hours. Anemia Panel: No results for input(s): VITAMINB12, FOLATE, FERRITIN, TIBC, IRON, RETICCTPCT in the last 72 hours. Sepsis Labs: No results for input(s): PROCALCITON, LATICACIDVEN in the last 168 hours.  Recent Results (from the past 240 hour(s))  Culture, blood (Routine X 2) w Reflex to ID Panel     Status: Abnormal (Preliminary result)    Collection Time: 07/30/16  7:51 AM  Result Value Ref Range Status   Specimen Description BLOOD RIGHT HAND  Final   Special Requests IN PEDIATRIC BOTTLE 1CC  Final   Culture  Setup Time   Final    GRAM POSITIVE COCCI IN CLUSTERS AEROBIC BOTTLE ONLY CRITICAL VALUE NOTED.  VALUE IS CONSISTENT WITH PREVIOUSLY REPORTED AND CALLED VALUE.    Culture STAPHYLOCOCCUS AUREUS (A)  Final   Report Status PENDING  Incomplete  Culture, blood (Routine X 2) w Reflex to ID Panel     Status: None (Preliminary result)   Collection Time: 07/30/16  7:54 AM  Result Value Ref Range Status   Specimen Description BLOOD RIGHT HAND  Final   Special Requests BOTTLES DRAWN AEROBIC AND ANAEROBIC 5CC EA  Final   Culture  Setup Time   Final    GRAM  POSITIVE COCCI IN CLUSTERS IN BOTH AEROBIC AND ANAEROBIC BOTTLES CRITICAL RESULT CALLED TO, READ BACK BY AND VERIFIED WITH: J MILLEN PHARMD 2206 07/30/16 A BROWNING    Culture   Final    GRAM POSITIVE COCCI CULTURE REINCUBATED FOR BETTER GROWTH    Report Status PENDING  Incomplete  Blood Culture ID Panel (Reflexed)     Status: Abnormal   Collection Time: 07/30/16  7:54 AM  Result Value Ref Range Status   Enterococcus species NOT DETECTED NOT DETECTED Final   Listeria monocytogenes NOT DETECTED NOT DETECTED Final   Staphylococcus species DETECTED (A) NOT DETECTED Final    Comment: CRITICAL RESULT CALLED TO, READ BACK BY AND VERIFIED WITH: J MILLEN PHARMD 2206 07/30/16 A BROWNING    Staphylococcus aureus DETECTED (A) NOT DETECTED Final    Comment: CRITICAL RESULT CALLED TO, READ BACK BY AND VERIFIED WITH: J MILLEN PHARMD 2206 07/30/16 A BROWNING    Methicillin resistance NOT DETECTED NOT DETECTED Final   Streptococcus species NOT DETECTED NOT DETECTED Final   Streptococcus agalactiae NOT DETECTED NOT DETECTED Final   Streptococcus pneumoniae NOT DETECTED NOT DETECTED Final   Streptococcus pyogenes NOT DETECTED NOT DETECTED Final   Acinetobacter baumannii NOT DETECTED  NOT DETECTED Final   Enterobacteriaceae species NOT DETECTED NOT DETECTED Final   Enterobacter cloacae complex NOT DETECTED NOT DETECTED Final   Escherichia coli NOT DETECTED NOT DETECTED Final   Klebsiella oxytoca NOT DETECTED NOT DETECTED Final   Klebsiella pneumoniae NOT DETECTED NOT DETECTED Final   Proteus species NOT DETECTED NOT DETECTED Final   Serratia marcescens NOT DETECTED NOT DETECTED Final   Haemophilus influenzae NOT DETECTED NOT DETECTED Final   Neisseria meningitidis NOT DETECTED NOT DETECTED Final   Pseudomonas aeruginosa NOT DETECTED NOT DETECTED Final   Candida albicans NOT DETECTED NOT DETECTED Final   Candida glabrata NOT DETECTED NOT DETECTED Final   Candida krusei NOT DETECTED NOT DETECTED Final   Candida parapsilosis NOT DETECTED NOT DETECTED Final   Candida tropicalis NOT DETECTED NOT DETECTED Final  MRSA PCR Screening     Status: None   Collection Time: 07/30/16 10:25 AM  Result Value Ref Range Status   MRSA by PCR NEGATIVE NEGATIVE Final    Comment:        The GeneXpert MRSA Assay (FDA approved for NASAL specimens only), is one component of a comprehensive MRSA colonization surveillance program. It is not intended to diagnose MRSA infection nor to guide or monitor treatment for MRSA infections.          Radiology Studies: Dg Wrist Complete Left  Result Date: 07/30/2016 CLINICAL DATA:  Swelling and bulging wound along the central posterior wrist EXAM: LEFT WRIST - COMPLETE 3+ VIEW COMPARISON:  06/06/2008 FINDINGS: No acute fracture or dislocation. Moderate osteoarthritis of the radiocarpal joint. Mild osteoarthritis of the scaphotrapeziotrapezoid joint. Mild osteoarthritis of the first [place] joint. Moderate osteoarthritis of the first [place] joint. Periarticular erosions involving the first metacarpal head as can be seen with gout. Soft tissue swelling along the dorsal aspect the hand which may represent a small hematoma given the patient's history.  Peripheral vascular atherosclerotic disease. IMPRESSION: 1.  No acute osseous injury of the left hand. 2. Periarticular erosions involving the first metacarpal head as can be seen with gout. Electronically Signed   By: Kathreen Devoid   On: 07/30/2016 09:39   Dg Chest Port 1 View  Result Date: 07/30/2016 CLINICAL DATA:  Increasing shortness of Breath EXAM: PORTABLE CHEST 1 VIEW  COMPARISON:  07/27/2016 FINDINGS: Cardiac shadow remains enlarged. The lungs are again well aerated without focal infiltrate. Very mild vascular congestion is seen without interstitial edema. No bony abnormality is noted. IMPRESSION: Mild vascular prominence without interstitial edema. Electronically Signed   By: Inez Catalina M.D.   On: 07/30/2016 13:58        Scheduled Meds: . allopurinol  200 mg Oral Daily  . amiodarone  200 mg Oral Daily  . apixaban  5 mg Oral BID  . aspirin  81 mg Oral Daily  . atorvastatin  40 mg Oral q1800  .  ceFAZolin (ANCEF) IV  2 g Intravenous Q8H  . clopidogrel  75 mg Oral Q breakfast  . diltiazem  180 mg Oral Daily  . fenofibrate  160 mg Oral Daily  . insulin aspart  0-15 Units Subcutaneous TID WC  . insulin aspart  0-5 Units Subcutaneous QHS  . insulin glargine  60 Units Subcutaneous Daily  . levothyroxine  150 mcg Oral QAC breakfast  . metoprolol  75 mg Oral BID  . sodium chloride flush  3 mL Intravenous Q12H  . vancomycin  125 mg Oral Q6H   Continuous Infusions: . sodium chloride 75 mL/hr at 07/31/16 0700     LOS: 4 days    Time spent: > 35 minutes  Velvet Bathe, MD [facility] Pager [phone]  If 7PM-7AM, please contact night-coverage [email or web address] Password [facility] 07/31/2016, 3:34 PM

## 2016-08-01 ENCOUNTER — Inpatient Hospital Stay (HOSPITAL_COMMUNITY): Payer: Medicare Other

## 2016-08-01 DIAGNOSIS — I5031 Acute diastolic (congestive) heart failure: Secondary | ICD-10-CM

## 2016-08-01 DIAGNOSIS — M25532 Pain in left wrist: Secondary | ICD-10-CM

## 2016-08-01 DIAGNOSIS — M7989 Other specified soft tissue disorders: Secondary | ICD-10-CM

## 2016-08-01 LAB — GLUCOSE, CAPILLARY
GLUCOSE-CAPILLARY: 203 mg/dL — AB (ref 65–99)
Glucose-Capillary: 176 mg/dL — ABNORMAL HIGH (ref 65–99)
Glucose-Capillary: 209 mg/dL — ABNORMAL HIGH (ref 65–99)
Glucose-Capillary: 194 mg/dL — ABNORMAL HIGH (ref 65–99)

## 2016-08-01 LAB — CULTURE, BLOOD (ROUTINE X 2)

## 2016-08-01 LAB — LIPID PANEL
Cholesterol: 101 mg/dL (ref 0–200)
HDL: 22 mg/dL — ABNORMAL LOW (ref >40)
LDL CALC: 10 mg/dL (ref 0–99)
Total CHOL/HDL Ratio: 4.6 RATIO
Triglycerides: 344 mg/dL — ABNORMAL HIGH (ref <150)
VLDL: 69 mg/dL — AB (ref 0–40)

## 2016-08-01 LAB — C DIFFICILE QUICK SCREEN W PCR REFLEX
C DIFFICLE (CDIFF) ANTIGEN: POSITIVE — AB
C Diff toxin: NEGATIVE

## 2016-08-01 LAB — ECHOCARDIOGRAM COMPLETE
HEIGHTINCHES: 71 in
WEIGHTICAEL: 4176.39 [oz_av]

## 2016-08-01 LAB — CLOSTRIDIUM DIFFICILE BY PCR: Toxigenic C. Difficile by PCR: POSITIVE — AB

## 2016-08-01 NOTE — Progress Notes (Signed)
PROGRESS NOTE    Michael [name]  IRS:[id number] DOB: 09/14/1940 DOA: 07/27/2016 PCP: [name], MD    Brief Narrative: 76 y.o. male with medical history significant for insulin-dependent diabetes mellitus, hypertension, coronary artery disease, chronic kidney disease stage III, paroxysmal atrial fibrillation on Eliquis, and reported small bowel AVMs who presents to the emergency department with chest pain and dyspnea.  Patient ended up having cardiac catheter evaluation. Subsequently developed fevers and was found to have cellulitis of left wrist as well as Staphylococcus aureus bacteremia.   Assessment & Plan:   Principal Problem:   Staphylococcus aureus bacteremia - Patient placed on IV antibiotics continue Ancef  - continue current regimen. - ID on board and recommended obtaining TTE. Orders reviewed and TTE pending. Picc line to be placed once repeat blood cultures negative.  Diarrhea - Pt started on oral vanc while C diff test pending. Results not available.  Active Problems:   Pancytopenia ([place])   DM (diabetes mellitus), type 2 with peripheral vascular complications (HCC) - diabetic diet, SSI    Angiodysplasia of intestinal tract   Hypertriglyceridemia   Carotid stenosis    PAF (paroxysmal atrial fibrillation) ([place]) - Patient currently on Cardizem and beta blocker. Also on eliquis    Hyperlipidemia - stable on lipitor    Essential hypertension - continue cardizem and metroprolol if BP remains consistently elevated will plan on increasing or adding another antihypertensive medication.    Cholelithiasis - Stable    Hypothyroidism - We'll continue Synthroid, stable no hypothyroidism symptoms reported    Hyperkalemia - Resolved    Chronic diastolic CHF (congestive heart failure) (HCC)   Nonrheumatic aortic valve stenosis/NSTEMI (non-ST elevated myocardial infarction) (HCC)/ CAD (coronary artery disease)/ - Cardiology on board and managing medication  recommendations. Of note cardiology recommending aspirin plus Plavix plus apixaban for 1 month then stopping the aspirin     PAD (peripheral artery disease) (HCC)   Septic thrombophlebitis of upper extremity   History of Clostridium difficile colitis   DVT prophylaxis: pt on Eliquis Code Status: Full Family Communication: d/c patient directly Disposition Plan: pending improvement in condition and resolution of fevers. Pt may require further testing   Consultants:   ID  Cardiology  Procedures: Cardiac cath  Antimicrobials: Cefazolin, Vancomycin   Subjective: Pt has no new complaints this morning.  Objective: Vitals:   07/31/16 1045 07/31/16 1652 07/31/16 2016 08/01/16 0603  BP: (!) 180/51 (!) 154/56 (!) 170/70 (!) 171/61  Pulse: 90 72 77 88  Resp: 14 15 19 19   Temp: 98.3 F (36.8 C) 97.6 F (36.4 C) 99 F (37.2 C) 98.9 F (37.2 C)  TempSrc: Oral Oral Oral Oral  SpO2: 96% 97% 98% 98%  Weight:      Height:        Intake/Output Summary (Last 24 hours) at 08/01/16 0824 Last data filed at 08/01/16 0700  Gross per 24 hour  Intake             1550 ml  Output              850 ml  Net              700 ml   Filed Weights   07/29/16 0423 07/30/16 0700 07/31/16 0355  Weight: 117.8 kg (259 lb 11.2 oz) 118 kg (260 lb 2.3 oz) 118.4 kg (261 lb 0.4 oz)    Examination:  General exam: Appears calm and comfortable  Respiratory system: Clear to auscultation. Respiratory effort normal. Cardiovascular  system: S1 & S2 heard, RRR. No JVD, murmurs, rubs, gallops or clicks. No pedal edema. Gastrointestinal system: Abdomen is nondistended, soft and nontender. No organomegaly or masses felt. Normal bowel sounds heard. Central nervous system: Alert and oriented. No focal neurological deficits. Extremities: Symmetric 5 x 5 power. Skin: No lesions or ulcers on limited exam. Warm and dry Psychiatry: Judgement and insight appear normal. Mood & affect appropriate.   Data Reviewed: I  have personally reviewed following labs and imaging studies  CBC:  Recent Labs Lab 07/27/16 0156 07/27/16 0957 07/28/16 0258 07/29/16 0122 07/30/16 0346  WBC 2.8*  --  5.4 6.0 8.5  HGB 8.4* 11.8* 11.8* 12.3* 10.8*  HCT 22.7* 32.3* 33.8* 34.9* 32.8*  MCV 83.2  --  84.5 85.5 88.6  PLT 121*  --  129* 141* 941*   Basic Metabolic Panel:  Recent Labs Lab 07/27/16 0156 07/27/16 0957 07/28/16 0258 07/29/16 0122 07/30/16 0346 07/31/16 0455  NA 136  --  134* 135 134* 131*  K 5.2* 4.3 4.0 4.0 4.0 3.6  CL 105  --  102 102 103 102  CO2 19*  --  25 25 18* 21*  GLUCOSE 449*  --  205* 225* 250* 204*  BUN 26*  --  22* 22* 29* 32*  CREATININE 1.74*  --  1.49* 1.56* 2.03* 2.02*  CALCIUM 8.3*  --  8.8* 8.9 8.4* 7.5*   GFR: Estimated Creatinine Clearance: 42.6 mL/min (by C-G formula based on SCr of 2.02 mg/dL (H)). Liver Function Tests:  Recent Labs Lab 07/27/16 0156 07/30/16 0751  AST 60* 40  ALT 22 24  ALKPHOS 33* 36*  BILITOT 1.4* 1.3*  PROT 5.9* 6.7  ALBUMIN 3.5 3.5    Recent Labs Lab 07/27/16 0156  LIPASE 43   No results for input(s): AMMONIA in the last 168 hours. Coagulation Profile:  Recent Labs Lab 07/29/16 0122  INR 1.01   Cardiac Enzymes:  Recent Labs Lab 07/27/16 0957 07/27/16 1641 07/28/16 1417 07/28/16 1819 07/29/16 0122  TROPONINI 0.29* 1.63* 1.88* 1.58* 1.78*   BNP (last 3 results) No results for input(s): PROBNP in the last 8760 hours. HbA1C: No results for input(s): HGBA1C in the last 72 hours. CBG:  Recent Labs Lab 07/31/16 0618 07/31/16 1205 07/31/16 1703 07/31/16 2131 08/01/16 0740  GLUCAP 210* 192* 206* 200* 176*   Lipid Profile:  Recent Labs  08/01/16 0440  CHOL 101  HDL 22*  LDLCALC 10  TRIG 344*  CHOLHDL 4.6   Thyroid Function Tests: No results for input(s): TSH, T4TOTAL, FREET4, T3FREE, THYROIDAB in the last 72 hours. Anemia Panel: No results for input(s): VITAMINB12, FOLATE, FERRITIN, TIBC, IRON, RETICCTPCT  in the last 72 hours. Sepsis Labs: No results for input(s): PROCALCITON, LATICACIDVEN in the last 168 hours.  Recent Results (from the past 240 hour(s))  Culture, blood (Routine X 2) w Reflex to ID Panel     Status: Abnormal (Preliminary result)   Collection Time: 07/30/16  7:51 AM  Result Value Ref Range Status   Specimen Description BLOOD RIGHT HAND  Final   Special Requests IN PEDIATRIC BOTTLE 1CC  Final   Culture  Setup Time   Final    GRAM POSITIVE COCCI IN CLUSTERS AEROBIC BOTTLE ONLY CRITICAL VALUE NOTED.  VALUE IS CONSISTENT WITH PREVIOUSLY REPORTED AND CALLED VALUE.    Culture STAPHYLOCOCCUS AUREUS (A)  Final   Report Status PENDING  Incomplete  Culture, blood (Routine X 2) w Reflex to ID Panel  Status: None (Preliminary result)   Collection Time: 07/30/16  7:54 AM  Result Value Ref Range Status   Specimen Description BLOOD RIGHT HAND  Final   Special Requests BOTTLES DRAWN AEROBIC AND ANAEROBIC 5CC EA  Final   Culture  Setup Time   Final    GRAM POSITIVE COCCI IN CLUSTERS IN BOTH AEROBIC AND ANAEROBIC BOTTLES CRITICAL RESULT CALLED TO, READ BACK BY AND VERIFIED WITH: J MILLEN PHARMD 2206 07/30/16 A BROWNING    Culture   Final    GRAM POSITIVE COCCI CULTURE REINCUBATED FOR BETTER GROWTH    Report Status PENDING  Incomplete  Blood Culture ID Panel (Reflexed)     Status: Abnormal   Collection Time: 07/30/16  7:54 AM  Result Value Ref Range Status   Enterococcus species NOT DETECTED NOT DETECTED Final   Listeria monocytogenes NOT DETECTED NOT DETECTED Final   Staphylococcus species DETECTED (A) NOT DETECTED Final    Comment: CRITICAL RESULT CALLED TO, READ BACK BY AND VERIFIED WITH: J MILLEN PHARMD 2206 07/30/16 A BROWNING    Staphylococcus aureus DETECTED (A) NOT DETECTED Final    Comment: CRITICAL RESULT CALLED TO, READ BACK BY AND VERIFIED WITH: J MILLEN PHARMD 2206 07/30/16 A BROWNING    Methicillin resistance NOT DETECTED NOT DETECTED Final   Streptococcus  species NOT DETECTED NOT DETECTED Final   Streptococcus agalactiae NOT DETECTED NOT DETECTED Final   Streptococcus pneumoniae NOT DETECTED NOT DETECTED Final   Streptococcus pyogenes NOT DETECTED NOT DETECTED Final   Acinetobacter baumannii NOT DETECTED NOT DETECTED Final   Enterobacteriaceae species NOT DETECTED NOT DETECTED Final   Enterobacter cloacae complex NOT DETECTED NOT DETECTED Final   Escherichia coli NOT DETECTED NOT DETECTED Final   Klebsiella oxytoca NOT DETECTED NOT DETECTED Final   Klebsiella pneumoniae NOT DETECTED NOT DETECTED Final   Proteus species NOT DETECTED NOT DETECTED Final   Serratia marcescens NOT DETECTED NOT DETECTED Final   Haemophilus influenzae NOT DETECTED NOT DETECTED Final   Neisseria meningitidis NOT DETECTED NOT DETECTED Final   Pseudomonas aeruginosa NOT DETECTED NOT DETECTED Final   Candida albicans NOT DETECTED NOT DETECTED Final   Candida glabrata NOT DETECTED NOT DETECTED Final   Candida krusei NOT DETECTED NOT DETECTED Final   Candida parapsilosis NOT DETECTED NOT DETECTED Final   Candida tropicalis NOT DETECTED NOT DETECTED Final  MRSA PCR Screening     Status: None   Collection Time: 07/30/16 10:25 AM  Result Value Ref Range Status   MRSA by PCR NEGATIVE NEGATIVE Final    Comment:        The GeneXpert MRSA Assay (FDA approved for NASAL specimens only), is one component of a comprehensive MRSA colonization surveillance program. It is not intended to diagnose MRSA infection nor to guide or monitor treatment for MRSA infections.          Radiology Studies: Dg Wrist Complete Left  Result Date: 07/30/2016 CLINICAL DATA:  Swelling and bulging wound along the central posterior wrist EXAM: LEFT WRIST - COMPLETE 3+ VIEW COMPARISON:  06/06/2008 FINDINGS: No acute fracture or dislocation. Moderate osteoarthritis of the radiocarpal joint. Mild osteoarthritis of the scaphotrapeziotrapezoid joint. Mild osteoarthritis of the first [place] joint.  Moderate osteoarthritis of the first [place] joint. Periarticular erosions involving the first metacarpal head as can be seen with gout. Soft tissue swelling along the dorsal aspect the hand which may represent a small hematoma given the patient's history. Peripheral vascular atherosclerotic disease. IMPRESSION: 1.  No acute osseous injury of  the left hand. 2. Periarticular erosions involving the first metacarpal head as can be seen with gout. Electronically Signed   By: Kathreen Devoid   On: 07/30/2016 09:39   Dg Chest Port 1 View  Result Date: 07/30/2016 CLINICAL DATA:  Increasing shortness of Breath EXAM: PORTABLE CHEST 1 VIEW COMPARISON:  07/27/2016 FINDINGS: Cardiac shadow remains enlarged. The lungs are again well aerated without focal infiltrate. Very mild vascular congestion is seen without interstitial edema. No bony abnormality is noted. IMPRESSION: Mild vascular prominence without interstitial edema. Electronically Signed   By: Inez Catalina M.D.   On: 07/30/2016 13:58        Scheduled Meds: . allopurinol  200 mg Oral Daily  . amiodarone  200 mg Oral Daily  . apixaban  5 mg Oral BID  . aspirin  81 mg Oral Daily  . atorvastatin  40 mg Oral q1800  .  ceFAZolin (ANCEF) IV  2 g Intravenous Q8H  . clopidogrel  75 mg Oral Q breakfast  . diltiazem  180 mg Oral Daily  . fenofibrate  160 mg Oral Daily  . insulin aspart  0-15 Units Subcutaneous TID WC  . insulin aspart  0-5 Units Subcutaneous QHS  . insulin glargine  60 Units Subcutaneous Daily  . levothyroxine  150 mcg Oral QAC breakfast  . metoprolol  75 mg Oral BID  . sodium chloride flush  3 mL Intravenous Q12H  . vancomycin  125 mg Oral Q6H   Continuous Infusions: . sodium chloride 75 mL/hr at 07/31/16 0700     LOS: 5 days    Time spent: > 35 minutes  Velvet Bathe, MD [facility] Pager [phone]  If 7PM-7AM, please contact night-coverage [email or web address] Password [email or web address] 08/01/2016, 8:24 AM

## 2016-08-01 NOTE — Progress Notes (Signed)
Patient ID: Michael [name], male   DOB: 1941-07-02, 76 y.o.   MRN: [id number]          [facility]  Date of Admission:  07/27/2016           Day 2 cefazolin        Day 2 oral vancomycin  Principal Problem:   Staphylococcus aureus bacteremia Active Problems:   Septic thrombophlebitis of upper extremity   History of Clostridium difficile colitis   Pancytopenia (HCC)   DM (diabetes mellitus), type 2 with peripheral vascular complications (HCC)   Angiodysplasia of intestinal tract   Hypertriglyceridemia   Carotid stenosis   PAF (paroxysmal atrial fibrillation) (HCC)   Hyperlipidemia   Essential hypertension   Cholelithiasis   Hypothyroidism   Hyperkalemia   Chronic diastolic CHF (congestive heart failure) ([place])   Nonrheumatic aortic valve stenosis   NSTEMI (non-ST elevated myocardial infarction) ([place])   CAD (coronary artery disease)   PAD (peripheral artery disease) ([place])   . allopurinol  200 mg Oral Daily  . amiodarone  200 mg Oral Daily  . apixaban  5 mg Oral BID  . aspirin  81 mg Oral Daily  . atorvastatin  40 mg Oral q1800  .  ceFAZolin (ANCEF) IV  2 g Intravenous Q8H  . clopidogrel  75 mg Oral Q breakfast  . diltiazem  180 mg Oral Daily  . fenofibrate  160 mg Oral Daily  . insulin aspart  0-15 Units Subcutaneous TID WC  . insulin aspart  0-5 Units Subcutaneous QHS  . insulin glargine  60 Units Subcutaneous Daily  . levothyroxine  150 mcg Oral QAC breakfast  . metoprolol  75 mg Oral BID  . sodium chloride flush  3 mL Intravenous Q12H  . vancomycin  125 mg Oral Q6H    SUBJECTIVE: He is feeling slightly better but still having considerable pain in his left hand. He is also still having diarrhea.  Review of Systems: Review of Systems  Constitutional: Positive for malaise/fatigue. Negative for chills, diaphoresis, fever and weight loss.  HENT: Negative for sore throat.   Respiratory: Negative for cough, sputum production and shortness of  breath.   Cardiovascular: Negative for chest pain.  Gastrointestinal: Positive for diarrhea. Negative for abdominal pain, heartburn, nausea and vomiting.  Genitourinary: Negative for dysuria and frequency.  Musculoskeletal: Positive for joint pain. Negative for myalgias.  Skin: Negative for rash.    Past Medical History:  Diagnosis Date  . Allergy   . Arthritis   . AVM (arteriovenous malformation) of colon   . CAD (coronary artery disease)    a. Cath 10/2014 - Mild to moderate diagonal, circumflex and obtuse marginal disease. Innominate artery sternosis.  . Carotid artery disease ([place])    a. Carotid dopple 03/2014 15-17% RICA &  61-60% LICA stenosis b. 7/37  . CKD (chronic kidney disease), stage III   . Clostridium difficile colitis 05/13/2016  . Dysrhythmia    ATRIAL FIBRILATION  . Elevated troponin    a. 09/2014 in setting of AF RVR-->Myoview: EF 49%, no ischemia/infarct->Med Rx.  . GERD (gastroesophageal reflux disease)   . Gout   . H/O transfusion of whole blood   . History of PFTs    PFTs 6/16:  FVC 3.73 (87%), FEV1 2.93 (88%), FEV1/FVC 78%, DLCO 69%  . Hx of adenomatous colonic polyps 07/02/2016  . Hyperlipidemia   . Hypertension   . Hypothyroidism   . Iron deficiency anemia   . PAF (paroxysmal  atrial fibrillation) ([place])    a. 09/2014: Converted on Dilt;  b. CHA2DS2VASc = 3-->eliquis;  c. 09/2014 Echo: EF 55-60%, mild LVH, mildly dil LA.  [name] Personal history of colonic polyps 2007, 2008   adenoma each time, largest 12 mm in 2007  . Psoriasis   . Type II diabetes mellitus ([place])    TYPE 2    Social History  Substance Use Topics  . Smoking status: Former Smoker    Packs/day: 0.70    Years: 48.00    Types: Cigarettes    Quit date: 03/29/2006  . Smokeless tobacco: Never Used  . Alcohol use 0.6 oz/week    1 Cans of beer per week     Comment: occasional alcohol intake    Family History  Problem Relation Age of Onset  . Heart attack Father   . Lung cancer Father   .  Diabetes Father   . Stroke Father   . Hypertension Father   . Stroke Mother   . Hypertension Mother   . Cancer Brother     liver  . Heart disease Brother     chf  . COPD Sister   . Malignant hyperthermia Neg Hx    Allergies  Allergen Reactions  . Percocet [Oxycodone-Acetaminophen] Nausea And Vomiting  . Invokana [Canagliflozin]     Side effects  . Metformin And Related     Upset stomach    OBJECTIVE: Vitals:   07/31/16 2016 08/01/16 0603 08/01/16 1339 08/01/16 1354  BP: (!) 170/70 (!) 171/61 (!) 221/115 (!) 162/46  Pulse: 77 88 66   Resp: 19 19 20    Temp: 99 F (37.2 C) 98.9 F (37.2 C) 98.1 F (36.7 C)   TempSrc: Oral Oral Oral   SpO2: 98% 98% 100%   Weight:      Height:       Body mass index is 36.41 kg/m.  Physical Exam  Constitutional: He is oriented to person, place, and time.  He is resting quietly in bed.  Cardiovascular: Normal rate and regular rhythm.   No murmur heard. Pulmonary/Chest: Effort normal and breath sounds normal.  Abdominal: Soft. There is no tenderness.  Musculoskeletal:  The swelling on the dorsum of his left hand and wrist is slightly decreased since yesterday.  Neurological: He is alert and oriented to person, place, and time.  Psychiatric: Mood and affect normal.    Lab Results Lab Results  Component Value Date   WBC 8.5 07/30/2016   HGB 10.8 (L) 07/30/2016   HCT 32.8 (L) 07/30/2016   MCV 88.6 07/30/2016   PLT 100 (L) 07/30/2016    Lab Results  Component Value Date   CREATININE 2.02 (H) 07/31/2016   BUN 32 (H) 07/31/2016   NA 131 (L) 07/31/2016   K 3.6 07/31/2016   CL 102 07/31/2016   CO2 21 (L) 07/31/2016    Lab Results  Component Value Date   ALT 24 07/30/2016   AST 40 07/30/2016   ALKPHOS 36 (L) 07/30/2016   BILITOT 1.3 (H) 07/30/2016     Microbiology: Recent Results (from the past 240 hour(s))  Culture, blood (Routine X 2) w Reflex to ID Panel     Status: Abnormal   Collection Time: 07/30/16  7:51 AM    Result Value Ref Range Status   Specimen Description BLOOD RIGHT HAND  Final   Special Requests IN PEDIATRIC BOTTLE [place]  Final   Culture  Setup Time   Final    GRAM POSITIVE COCCI  IN CLUSTERS AEROBIC BOTTLE ONLY CRITICAL VALUE NOTED.  VALUE IS CONSISTENT WITH PREVIOUSLY REPORTED AND CALLED VALUE.    Culture (A)  Final    STAPHYLOCOCCUS AUREUS SUSCEPTIBILITIES PERFORMED ON PREVIOUS CULTURE WITHIN THE LAST 5 DAYS.    Report Status 08/01/2016 FINAL  Final  Culture, blood (Routine X 2) w Reflex to ID Panel     Status: Abnormal   Collection Time: 07/30/16  7:54 AM  Result Value Ref Range Status   Specimen Description BLOOD RIGHT HAND  Final   Special Requests BOTTLES DRAWN AEROBIC AND ANAEROBIC 5CC EA  Final   Culture  Setup Time   Final    GRAM POSITIVE COCCI IN CLUSTERS IN BOTH AEROBIC AND ANAEROBIC BOTTLES CRITICAL RESULT CALLED TO, READ BACK BY AND VERIFIED WITH: J MILLEN PHARMD 2206 07/30/16 A BROWNING    Culture STAPHYLOCOCCUS AUREUS (A)  Final   Report Status 08/01/2016 FINAL  Final   Organism ID, Bacteria STAPHYLOCOCCUS AUREUS  Final      Susceptibility   Staphylococcus aureus - MIC*    CIPROFLOXACIN <=0.5 SENSITIVE Sensitive     ERYTHROMYCIN <=0.25 SENSITIVE Sensitive     GENTAMICIN <=0.5 SENSITIVE Sensitive     OXACILLIN 0.5 SENSITIVE Sensitive     TETRACYCLINE <=1 SENSITIVE Sensitive     VANCOMYCIN <=0.5 SENSITIVE Sensitive     TRIMETH/SULFA <=10 SENSITIVE Sensitive     CLINDAMYCIN <=0.25 SENSITIVE Sensitive     RIFAMPIN <=0.5 SENSITIVE Sensitive     Inducible Clindamycin NEGATIVE Sensitive     * STAPHYLOCOCCUS AUREUS  Blood Culture ID Panel (Reflexed)     Status: Abnormal   Collection Time: 07/30/16  7:54 AM  Result Value Ref Range Status   Enterococcus species NOT DETECTED NOT DETECTED Final   Listeria monocytogenes NOT DETECTED NOT DETECTED Final   Staphylococcus species DETECTED (A) NOT DETECTED Final    Comment: CRITICAL RESULT CALLED TO, READ BACK BY AND  VERIFIED WITH: J MILLEN PHARMD 2206 07/30/16 A BROWNING    Staphylococcus aureus DETECTED (A) NOT DETECTED Final    Comment: CRITICAL RESULT CALLED TO, READ BACK BY AND VERIFIED WITH: J MILLEN PHARMD 2206 07/30/16 A BROWNING    Methicillin resistance NOT DETECTED NOT DETECTED Final   Streptococcus species NOT DETECTED NOT DETECTED Final   Streptococcus agalactiae NOT DETECTED NOT DETECTED Final   Streptococcus pneumoniae NOT DETECTED NOT DETECTED Final   Streptococcus pyogenes NOT DETECTED NOT DETECTED Final   Acinetobacter baumannii NOT DETECTED NOT DETECTED Final   Enterobacteriaceae species NOT DETECTED NOT DETECTED Final   Enterobacter cloacae complex NOT DETECTED NOT DETECTED Final   Escherichia coli NOT DETECTED NOT DETECTED Final   Klebsiella oxytoca NOT DETECTED NOT DETECTED Final   Klebsiella pneumoniae NOT DETECTED NOT DETECTED Final   Proteus species NOT DETECTED NOT DETECTED Final   Serratia marcescens NOT DETECTED NOT DETECTED Final   Haemophilus influenzae NOT DETECTED NOT DETECTED Final   Neisseria meningitidis NOT DETECTED NOT DETECTED Final   Pseudomonas aeruginosa NOT DETECTED NOT DETECTED Final   Candida albicans NOT DETECTED NOT DETECTED Final   Candida glabrata NOT DETECTED NOT DETECTED Final   Candida krusei NOT DETECTED NOT DETECTED Final   Candida parapsilosis NOT DETECTED NOT DETECTED Final   Candida tropicalis NOT DETECTED NOT DETECTED Final  MRSA PCR Screening     Status: None   Collection Time: 07/30/16 10:25 AM  Result Value Ref Range Status   MRSA by PCR NEGATIVE NEGATIVE Final    Comment:  The GeneXpert MRSA Assay (FDA approved for NASAL specimens only), is one component of a comprehensive MRSA colonization surveillance program. It is not intended to diagnose MRSA infection nor to guide or monitor treatment for MRSA infections.   Culture, blood (routine x 2)     Status: None (Preliminary result)   Collection Time: 07/31/16 10:51 AM  Result  Value Ref Range Status   Specimen Description BLOOD LEFT ASSIST CONTROL  Final   Special Requests BOTTLES DRAWN AEROBIC ONLY 5CC  Final   Culture NO GROWTH < 24 HOURS  Final   Report Status PENDING  Incomplete  Culture, blood (routine x 2)     Status: None (Preliminary result)   Collection Time: 07/31/16 10:51 AM  Result Value Ref Range Status   Specimen Description BLOOD RIGHT ASSIST CONTROL  Final   Special Requests BOTTLES DRAWN AEROBIC ONLY 5CC  Final   Culture NO GROWTH < 24 HOURS  Final   Report Status PENDING  Incomplete  C difficile quick scan w PCR reflex     Status: Abnormal   Collection Time: 07/31/16 11:49 AM  Result Value Ref Range Status   C Diff antigen POSITIVE (A) NEGATIVE Final   C Diff toxin NEGATIVE NEGATIVE Final   C Diff interpretation Results are indeterminate. See PCR results.  Final  Clostridium Difficile by PCR     Status: Abnormal   Collection Time: 07/31/16 11:49 AM  Result Value Ref Range Status   Toxigenic C Difficile by pcr POSITIVE (A) NEGATIVE Final    Comment: Positive for toxigenic C. difficile with little to no toxin production. Only treat if clinical presentation suggests symptomatic illness.     ASSESSMENT: He has septic thrombophlebitis of his left hand secondary to a previous IV complicated by MSSA bacteremia. Repeat blood cultures and echocardiogram are pending. I will have a PICC placed once I know cultures are negative. If the swelling and pain in his left hand is not getting better soon I would consider vascular surgery consult.  He had C. difficile colitis last fall. He is having diarrhea now. His C. difficile antigen is positive but the toxin is negative. Nonetheless he needs to stay on oral vancomycin to help prevent a full-blown relapse.  PLAN: 1. Continue current antibiotics 2. Await results of repeat blood cultures and echocardiogram 3. Consider vascular surgery consult  Michel Bickers, MD [facility] for [place] [phone] pager   [phone] cell 08/01/2016, 3:18 PM

## 2016-08-01 NOTE — Progress Notes (Signed)
CARDIAC REHAB PHASE I   Spoke with pt's nurse. Still no order for PT at this time. Pt would benefit from PT prior to ambulation with cardiac rehab. Will follow PT progress, with plans to follow up Monday.   Lenna Sciara, RN, BSN 08/01/2016 1:20 PM

## 2016-08-02 LAB — GLUCOSE, CAPILLARY
GLUCOSE-CAPILLARY: 204 mg/dL — AB (ref 65–99)
Glucose-Capillary: 180 mg/dL — ABNORMAL HIGH (ref 65–99)
Glucose-Capillary: 176 mg/dL — ABNORMAL HIGH (ref 65–99)
Glucose-Capillary: 154 mg/dL — ABNORMAL HIGH (ref 65–99)

## 2016-08-02 NOTE — Evaluation (Signed)
Physical Therapy Evaluation Patient Details Name: Michael [name] MRN: [id number] DOB: 16-Jan-1941 Today's Date: 08/02/2016   History of Present Illness  Patient is a 76 y/o male with hx of HTN, HLD, DM, hypothyroidism, gout, CAD, AVM of colon, CKD presents with CP and SOB. S/p cardiac cath. Found to have fevers and cellulitis of left wrist as well as Staphylococcus aureus bacteremia  Clinical Impression  Patient presents with pain, limited AROM/strength left wrist/digits, dyspnea on exertion and impaired endurance/mobility s/p above. Tolerated gait training with min guard assist for safety. Pt not able to functionally use LUE due to pain and swelling. Encouraged elevation of LUE and AROM of digits as tolerated. Pt has support from spouse at home. Encouraged ambulation with RN. Will follow acutely to maximize independence and mobility prior to return home.    Follow Up Recommendations No PT follow up;Supervision - Intermittent    Equipment Recommendations  None recommended by PT    Recommendations for Other Services OT consult     Precautions / Restrictions Precautions Precautions: Fall Restrictions Weight Bearing Restrictions: No      Mobility  Bed Mobility Overal bed mobility: Needs Assistance Bed Mobility: Supine to Sit     Supine to sit: Supervision;HOB elevated     General bed mobility comments: Increased effort to get to EOB, use of rail and time but no assist needed.  Transfers Overall transfer level: Needs assistance Equipment used: None Transfers: Sit to/from Stand Sit to Stand: Supervision         General transfer comment: Supervision for safety. Guarding LUE to stand.  Ambulation/Gait Ambulation/Gait assistance: Min guard Ambulation Distance (Feet): 150 Feet Assistive device: None Gait Pattern/deviations: Step-through pattern;Decreased stride length;Wide base of support;Staggering right;Staggering left Gait velocity: decreased Gait velocity interpretation:  <1.8 ft/sec, indicative of risk for recurrent falls General Gait Details: 3/4 DOE. Holding LUE close to chest in guarded position due to pain in left wrist. 2 standing rest breaks.   Stairs            Wheelchair Mobility    Modified Rankin (Stroke Patients Only)       Balance Overall balance assessment: Needs assistance Sitting-balance support: Feet supported;No upper extremity supported Sitting balance-Leahy Scale: Fair Sitting balance - Comments: total A to donn socks due to not able to use LUE due to pain   Standing balance support: During functional activity Standing balance-Leahy Scale: Fair                               Pertinent Vitals/Pain Pain Assessment: Faces Faces Pain Scale: Hurts even more Pain Location: left wrist Pain Descriptors / Indicators: Sore;Aching;Grimacing;Guarding Pain Intervention(s): Monitored during session;Repositioned    Home Living Family/patient expects to be discharged to:: Private residence Living Arrangements: Spouse/significant other Available Help at Discharge: Family;Available PRN/intermittently Type of Home: House Home Access: Stairs to enter Entrance Stairs-Rails: Right;Left;Can reach both Entrance Stairs-Number of Steps: 3 or 12 Home Layout: One level Home Equipment: None      Prior Function Level of Independence: Independent         Comments: Drives, cooks.     Hand Dominance   Dominant Hand: Right    Extremity/Trunk Assessment   Upper Extremity Assessment Upper Extremity Assessment: Defer to OT evaluation;LUE deficits/detail LUE Deficits / Details: Limited AROM at wrist, digits due to swelling and pain. Elbow and shoulder AROM WFL. Erythema and redness present.    Lower Extremity Assessment Lower  Extremity Assessment: Overall WFL for tasks assessed       Communication   Communication: HOH  Cognition Arousal/Alertness: Awake/alert Behavior During Therapy: WFL for tasks  assessed/performed Overall Cognitive Status: Within Functional Limits for tasks assessed                      General Comments      Exercises     Assessment/Plan    PT Assessment Patient needs continued PT services  PT Problem List Decreased mobility;Cardiopulmonary status limiting activity;Decreased activity tolerance;Decreased range of motion;Pain;Decreased balance;Decreased skin integrity          PT Treatment Interventions DME instruction;Therapeutic activities;Gait training;Therapeutic exercise;Patient/family education;Balance training;Functional mobility training;Stair training    PT Goals (Current goals can be found in the Care Plan section)  Acute Rehab PT Goals Patient Stated Goal: to make this wrist pain go away PT Goal Formulation: With patient Time For Goal Achievement: 08/16/16 Potential to Achieve Goals: Good    Frequency Min 3X/week   Barriers to discharge Inaccessible home environment stairs to enter home    Co-evaluation               End of Session Equipment Utilized During Treatment: Gait belt Activity Tolerance: Patient tolerated treatment well Patient left: in bed;with call bell/phone within reach Nurse Communication: Mobility status         Time: 0750-0810 PT Time Calculation (min) (ACUTE ONLY): 20 min   Charges:   PT Evaluation $PT Eval Low Complexity: 1 Procedure     PT G Codes:        Aldean Pipe A Avantika Shere 08/02/2016, 9:24 AM Wray Kearns, PT, DPT [phone]

## 2016-08-02 NOTE — Progress Notes (Signed)
PROGRESS NOTE    Michael [name]  WUJ:[id number] DOB: [birth year] DOA: 07/27/2016 PCP: [name], MD    Brief Narrative: 76 y.o. male with medical history significant for insulin-dependent diabetes mellitus, hypertension, coronary artery disease, chronic kidney disease stage III, paroxysmal atrial fibrillation on Eliquis, and reported small bowel AVMs who presents to the emergency department with chest pain and dyspnea.  Patient ended up having cardiac catheter evaluation. Subsequently developed fevers and was found to have cellulitis of left wrist as well as Staphylococcus aureus bacteremia.  Pt reports swelling has decreased over arm.   Assessment & Plan:   Principal Problem:   Staphylococcus aureus bacteremia - Patient placed on IV antibiotics continue Ancef  - continue current regimen. - ID on board and recommended obtaining TTE. Orders reviewed and TTE pending. Picc line to be placed once repeat blood cultures negative.  Diarrhea - Pt started on oral vanc while C diff test positive but toxin negative. Still on oral vancomycin will defer to ID  Active Problems:   Pancytopenia (HCC)   DM (diabetes mellitus), type 2 with peripheral vascular complications (HCC) - diabetic diet, SSI    Angiodysplasia of intestinal tract   Hypertriglyceridemia   Carotid stenosis    PAF (paroxysmal atrial fibrillation) ([place]) - Patient currently on Cardizem and beta blocker. Also on eliquis    Hyperlipidemia - stable on lipitor    Essential hypertension - continue cardizem and metroprolol if BP remains consistently elevated will plan on increasing or adding another antihypertensive medication.    Cholelithiasis - Stable    Hypothyroidism - We'll continue Synthroid, stable no hypothyroidism symptoms reported    Hyperkalemia - Resolved    Chronic diastolic CHF (congestive heart failure) (HCC)   Nonrheumatic aortic valve stenosis/NSTEMI (non-ST elevated myocardial infarction) (HCC)/  CAD (coronary artery disease)/ - Cardiology on board and managing medication recommendations. Of note cardiology recommending aspirin plus Plavix plus apixaban for 1 month then stopping the aspirin     PAD (peripheral artery disease) (HCC)   Septic thrombophlebitis of upper extremity   History of Clostridium difficile colitis   DVT prophylaxis: pt on Eliquis Code Status: Full Family Communication: d/c patient directly Disposition Plan: pending results and final recommendations from ID   Consultants:   ID  Cardiology  Procedures: Cardiac cath  Antimicrobials: Cefazolin, Vancomycin   Subjective: Pt has no new complaints this morning.  Objective: Vitals:   08/01/16 1940 08/02/16 0510 08/02/16 0956 08/02/16 1137  BP: (!) 175/60 (!) 158/64 (!) 191/59 (!) 151/76  Pulse: 72 74 82 88  Resp:  17  18  Temp: 98.4 F (36.9 C) 98.3 F (36.8 C)  99.1 F (37.3 C)  TempSrc: Oral Oral  Oral  SpO2: 98% 97% 98% 98%  Weight:  120.3 kg (265 lb 3.4 oz)    Height:        Intake/Output Summary (Last 24 hours) at 08/02/16 1256 Last data filed at 08/02/16 1003  Gross per 24 hour  Intake           3742.5 ml  Output                0 ml  Net           3742.5 ml   Filed Weights   07/30/16 0700 07/31/16 0355 08/02/16 0510  Weight: 118 kg (260 lb 2.3 oz) 118.4 kg (261 lb 0.4 oz) 120.3 kg (265 lb 3.4 oz)    Examination:  General exam: Appears calm and comfortable  Respiratory system: Clear to auscultation. Respiratory effort normal. Cardiovascular system: S1 & S2 heard, RRR. No JVD, murmurs, rubs, gallops or clicks. No pedal edema. Gastrointestinal system: Abdomen is nondistended, soft and nontender. No organomegaly or masses felt. Normal bowel sounds heard. Central nervous system: Alert and oriented. No focal neurological deficits. Extremities: Symmetric 5 x 5 power. Skin: No lesions or ulcers on limited exam. Warm and dry. Pt has cellulitis over left forearm Psychiatry: Judgement  and insight appear normal. Mood & affect appropriate.   Data Reviewed: I have personally reviewed following labs and imaging studies  CBC:  Recent Labs Lab 07/27/16 0156 07/27/16 0957 07/28/16 0258 07/29/16 0122 07/30/16 0346  WBC 2.8*  --  5.4 6.0 8.5  HGB 8.4* 11.8* 11.8* 12.3* 10.8*  HCT 22.7* 32.3* 33.8* 34.9* 32.8*  MCV 83.2  --  84.5 85.5 88.6  PLT 121*  --  129* 141* 008*   Basic Metabolic Panel:  Recent Labs Lab 07/27/16 0156 07/27/16 0957 07/28/16 0258 07/29/16 0122 07/30/16 0346 07/31/16 0455  NA 136  --  134* 135 134* 131*  K 5.2* 4.3 4.0 4.0 4.0 3.6  CL 105  --  102 102 103 102  CO2 19*  --  25 25 18* 21*  GLUCOSE 449*  --  205* 225* 250* 204*  BUN 26*  --  22* 22* 29* 32*  CREATININE 1.74*  --  1.49* 1.56* 2.03* 2.02*  CALCIUM 8.3*  --  8.8* 8.9 8.4* 7.5*   GFR: Estimated Creatinine Clearance: 43 mL/min (by C-G formula based on SCr of 2.02 mg/dL (H)). Liver Function Tests:  Recent Labs Lab 07/27/16 0156 07/30/16 0751  AST 60* 40  ALT 22 24  ALKPHOS 33* 36*  BILITOT 1.4* 1.3*  PROT 5.9* 6.7  ALBUMIN 3.5 3.5    Recent Labs Lab 07/27/16 0156  LIPASE 43   No results for input(s): AMMONIA in the last 168 hours. Coagulation Profile:  Recent Labs Lab 07/29/16 0122  INR 1.01   Cardiac Enzymes:  Recent Labs Lab 07/27/16 0957 07/27/16 1641 07/28/16 1417 07/28/16 1819 07/29/16 0122  TROPONINI 0.29* 1.63* 1.88* 1.58* 1.78*   BNP (last 3 results) No results for input(s): PROBNP in the last 8760 hours. HbA1C: No results for input(s): HGBA1C in the last 72 hours. CBG:  Recent Labs Lab 08/01/16 1157 08/01/16 1656 08/01/16 2127 08/02/16 0729 08/02/16 1135  GLUCAP 203* 209* 194* 180* 204*   Lipid Profile:  Recent Labs  08/01/16 0440  CHOL 101  HDL 22*  LDLCALC 10  TRIG 344*  CHOLHDL 4.6   Thyroid Function Tests: No results for input(s): TSH, T4TOTAL, FREET4, T3FREE, THYROIDAB in the last 72 hours. Anemia Panel: No  results for input(s): VITAMINB12, FOLATE, FERRITIN, TIBC, IRON, RETICCTPCT in the last 72 hours. Sepsis Labs: No results for input(s): PROCALCITON, LATICACIDVEN in the last 168 hours.  Recent Results (from the past 240 hour(s))  Culture, blood (Routine X 2) w Reflex to ID Panel     Status: Abnormal   Collection Time: 07/30/16  7:51 AM  Result Value Ref Range Status   Specimen Description BLOOD RIGHT HAND  Final   Special Requests IN PEDIATRIC BOTTLE 1CC  Final   Culture  Setup Time   Final    GRAM POSITIVE COCCI IN CLUSTERS AEROBIC BOTTLE ONLY CRITICAL VALUE NOTED.  VALUE IS CONSISTENT WITH PREVIOUSLY REPORTED AND CALLED VALUE.    Culture (A)  Final    STAPHYLOCOCCUS AUREUS SUSCEPTIBILITIES PERFORMED ON PREVIOUS CULTURE WITHIN  THE LAST 5 DAYS.    Report Status 08/01/2016 FINAL  Final  Culture, blood (Routine X 2) w Reflex to ID Panel     Status: Abnormal   Collection Time: 07/30/16  7:54 AM  Result Value Ref Range Status   Specimen Description BLOOD RIGHT HAND  Final   Special Requests BOTTLES DRAWN AEROBIC AND ANAEROBIC 5CC EA  Final   Culture  Setup Time   Final    GRAM POSITIVE COCCI IN CLUSTERS IN BOTH AEROBIC AND ANAEROBIC BOTTLES CRITICAL RESULT CALLED TO, READ BACK BY AND VERIFIED WITH: J MILLEN PHARMD 2206 07/30/16 A BROWNING    Culture STAPHYLOCOCCUS AUREUS (A)  Final   Report Status 08/01/2016 FINAL  Final   Organism ID, Bacteria STAPHYLOCOCCUS AUREUS  Final      Susceptibility   Staphylococcus aureus - MIC*    CIPROFLOXACIN <=0.5 SENSITIVE Sensitive     ERYTHROMYCIN <=0.25 SENSITIVE Sensitive     GENTAMICIN <=0.5 SENSITIVE Sensitive     OXACILLIN 0.5 SENSITIVE Sensitive     TETRACYCLINE <=1 SENSITIVE Sensitive     VANCOMYCIN <=0.5 SENSITIVE Sensitive     TRIMETH/SULFA <=10 SENSITIVE Sensitive     CLINDAMYCIN <=0.25 SENSITIVE Sensitive     RIFAMPIN <=0.5 SENSITIVE Sensitive     Inducible Clindamycin NEGATIVE Sensitive     * STAPHYLOCOCCUS AUREUS  Blood Culture ID  Panel (Reflexed)     Status: Abnormal   Collection Time: 07/30/16  7:54 AM  Result Value Ref Range Status   Enterococcus species NOT DETECTED NOT DETECTED Final   Listeria monocytogenes NOT DETECTED NOT DETECTED Final   Staphylococcus species DETECTED (A) NOT DETECTED Final    Comment: CRITICAL RESULT CALLED TO, READ BACK BY AND VERIFIED WITH: J MILLEN PHARMD 2206 07/30/16 A BROWNING    Staphylococcus aureus DETECTED (A) NOT DETECTED Final    Comment: CRITICAL RESULT CALLED TO, READ BACK BY AND VERIFIED WITH: J MILLEN PHARMD 2206 07/30/16 A BROWNING    Methicillin resistance NOT DETECTED NOT DETECTED Final   Streptococcus species NOT DETECTED NOT DETECTED Final   Streptococcus agalactiae NOT DETECTED NOT DETECTED Final   Streptococcus pneumoniae NOT DETECTED NOT DETECTED Final   Streptococcus pyogenes NOT DETECTED NOT DETECTED Final   Acinetobacter baumannii NOT DETECTED NOT DETECTED Final   Enterobacteriaceae species NOT DETECTED NOT DETECTED Final   Enterobacter cloacae complex NOT DETECTED NOT DETECTED Final   Escherichia coli NOT DETECTED NOT DETECTED Final   Klebsiella oxytoca NOT DETECTED NOT DETECTED Final   Klebsiella pneumoniae NOT DETECTED NOT DETECTED Final   Proteus species NOT DETECTED NOT DETECTED Final   Serratia marcescens NOT DETECTED NOT DETECTED Final   Haemophilus influenzae NOT DETECTED NOT DETECTED Final   Neisseria meningitidis NOT DETECTED NOT DETECTED Final   Pseudomonas aeruginosa NOT DETECTED NOT DETECTED Final   Candida albicans NOT DETECTED NOT DETECTED Final   Candida glabrata NOT DETECTED NOT DETECTED Final   Candida krusei NOT DETECTED NOT DETECTED Final   Candida parapsilosis NOT DETECTED NOT DETECTED Final   Candida tropicalis NOT DETECTED NOT DETECTED Final  MRSA PCR Screening     Status: None   Collection Time: 07/30/16 10:25 AM  Result Value Ref Range Status   MRSA by PCR NEGATIVE NEGATIVE Final    Comment:        The GeneXpert MRSA Assay  (FDA approved for NASAL specimens only), is one component of a comprehensive MRSA colonization surveillance program. It is not intended to diagnose MRSA infection nor to  guide or monitor treatment for MRSA infections.   Culture, blood (routine x 2)     Status: None (Preliminary result)   Collection Time: 07/31/16 10:51 AM  Result Value Ref Range Status   Specimen Description BLOOD LEFT ASSIST CONTROL  Final   Special Requests BOTTLES DRAWN AEROBIC ONLY 5CC  Final   Culture NO GROWTH < 24 HOURS  Final   Report Status PENDING  Incomplete  Culture, blood (routine x 2)     Status: None (Preliminary result)   Collection Time: 07/31/16 10:51 AM  Result Value Ref Range Status   Specimen Description BLOOD RIGHT ASSIST CONTROL  Final   Special Requests BOTTLES DRAWN AEROBIC ONLY 5CC  Final   Culture NO GROWTH < 24 HOURS  Final   Report Status PENDING  Incomplete  C difficile quick scan w PCR reflex     Status: Abnormal   Collection Time: 07/31/16 11:49 AM  Result Value Ref Range Status   C Diff antigen POSITIVE (A) NEGATIVE Final   C Diff toxin NEGATIVE NEGATIVE Final   C Diff interpretation Results are indeterminate. See PCR results.  Final  Clostridium Difficile by PCR     Status: Abnormal   Collection Time: 07/31/16 11:49 AM  Result Value Ref Range Status   Toxigenic C Difficile by pcr POSITIVE (A) NEGATIVE Final    Comment: Positive for toxigenic C. difficile with little to no toxin production. Only treat if clinical presentation suggests symptomatic illness.         Radiology Studies: No results found.      Scheduled Meds: . allopurinol  200 mg Oral Daily  . amiodarone  200 mg Oral Daily  . apixaban  5 mg Oral BID  . aspirin  81 mg Oral Daily  . atorvastatin  40 mg Oral q1800  .  ceFAZolin (ANCEF) IV  2 g Intravenous Q8H  . clopidogrel  75 mg Oral Q breakfast  . diltiazem  180 mg Oral Daily  . fenofibrate  160 mg Oral Daily  . insulin aspart  0-15 Units  Subcutaneous TID WC  . insulin aspart  0-5 Units Subcutaneous QHS  . insulin glargine  60 Units Subcutaneous Daily  . levothyroxine  150 mcg Oral QAC breakfast  . metoprolol  75 mg Oral BID  . sodium chloride flush  3 mL Intravenous Q12H  . vancomycin  125 mg Oral Q6H   Continuous Infusions: . sodium chloride 75 mL/hr at 08/02/16 0523     LOS: 6 days    Time spent: > 35 minutes  Velvet Bathe, MD [facility] Pager [phone]  If 7PM-7AM, please contact night-coverage [email or web address] Password [email or web address] 08/02/2016, 12:56 PM

## 2016-08-02 NOTE — Progress Notes (Signed)
Pharmacy Antibiotic Note  Michael [name] is a 76 y.o. male admitted on 07/27/2016, now with cellulitis and MSSA bacteremia.  Pharmacy has been consulted for cefazolin dosing. TTE without mention of vegetation. ID following. Last SCr was on 1/5. Repeat BCx are negative thus far.  Plan: Continue cefazolin 2 g IV q8h Check BMET with am labs Monitor renal function and culture data  Height: 5\' 11"  (180.3 cm) Weight: 265 lb 3.4 oz (120.3 kg) IBW/kg (Calculated) : 75.3  Temp (24hrs), Avg:98.5 F (36.9 C), Min:98.1 F (36.7 C), Max:99.1 F (37.3 C)   Recent Labs Lab 07/27/16 0156 07/28/16 0258 07/29/16 0122 07/30/16 0346 07/31/16 0455  WBC 2.8* 5.4 6.0 8.5  --   CREATININE 1.74* 1.49* 1.56* 2.03* 2.02*    Estimated Creatinine Clearance: 43 mL/min (by C-G formula based on SCr of 2.02 mg/dL (H)).    Allergies  Allergen Reactions  . Percocet [Oxycodone-Acetaminophen] Nausea And Vomiting  . Invokana [Canagliflozin]     Side effects  . Metformin And Related     Upset stomach   Vanc 1/4 >> 1/5  Ancef 1/5 >> Tamiflu 1/4 >> 1/4 1/5 PO vanc >>  1/4 MRSA PCR - negative 1/4 BCx x2 - MSSA 2/2 (BCID MSSA) 1/5 BCx x2 - ngtd 1/5 cdiff ant+, pcr+, toxin (-)  Thank you for allowing pharmacy to be a part of this patient's care.  Renold Genta, PharmD, BCPS Clinical Pharmacist Phone for today - [place] - [phone] 08/02/2016 11:41 AM

## 2016-08-02 NOTE — Progress Notes (Signed)
Patient ID: Michael [name], male   DOB: [birth year], 76 y.o.   MRN: [id number]         [facility]  Date of Admission:  07/27/2016           Day 3 cefazolin        Day 3 oral vancomycin  Principal Problem:   Staphylococcus aureus bacteremia Active Problems:   Septic thrombophlebitis of upper extremity   History of Clostridium difficile colitis   Pancytopenia (HCC)   DM (diabetes mellitus), type 2 with peripheral vascular complications (HCC)   Angiodysplasia of intestinal tract   Hypertriglyceridemia   Carotid stenosis   PAF (paroxysmal atrial fibrillation) (HCC)   Hyperlipidemia   Essential hypertension   Cholelithiasis   Hypothyroidism   Hyperkalemia   Chronic diastolic CHF (congestive heart failure) ([place])   Nonrheumatic aortic valve stenosis   NSTEMI (non-ST elevated myocardial infarction) ([place])   CAD (coronary artery disease)   PAD (peripheral artery disease) ([place])   . allopurinol  200 mg Oral Daily  . amiodarone  200 mg Oral Daily  . apixaban  5 mg Oral BID  . aspirin  81 mg Oral Daily  . atorvastatin  40 mg Oral q1800  .  ceFAZolin (ANCEF) IV  2 g Intravenous Q8H  . clopidogrel  75 mg Oral Q breakfast  . diltiazem  180 mg Oral Daily  . fenofibrate  160 mg Oral Daily  . insulin aspart  0-15 Units Subcutaneous TID WC  . insulin aspart  0-5 Units Subcutaneous QHS  . insulin glargine  60 Units Subcutaneous Daily  . levothyroxine  150 mcg Oral QAC breakfast  . metoprolol  75 mg Oral BID  . sodium chloride flush  3 mL Intravenous Q12H  . vancomycin  125 mg Oral Q6H    SUBJECTIVE: His left hand is feeling a little bit better. He is still having diarrhea.  Review of Systems: Review of Systems  Constitutional: Positive for malaise/fatigue. Negative for chills, diaphoresis, fever and weight loss.  HENT: Negative for sore throat.   Respiratory: Negative for cough, sputum production and shortness of breath.   Cardiovascular: Negative for  chest pain.  Gastrointestinal: Positive for diarrhea. Negative for abdominal pain, heartburn, nausea and vomiting.  Genitourinary: Negative for dysuria and frequency.  Musculoskeletal: Positive for joint pain. Negative for myalgias.  Skin: Negative for rash.    Past Medical History:  Diagnosis Date  . Allergy   . Arthritis   . AVM (arteriovenous malformation) of colon   . CAD (coronary artery disease)    a. Cath 10/2014 - Mild to moderate diagonal, circumflex and obtuse marginal disease. Innominate artery sternosis.  . Carotid artery disease ([place])    a. Carotid dopple 03/2014 25-85% RICA &  27-78% LICA stenosis b. 2/42  . CKD (chronic kidney disease), stage III   . Clostridium difficile colitis 05/13/2016  . Dysrhythmia    ATRIAL FIBRILATION  . Elevated troponin    a. 09/2014 in setting of AF RVR-->Myoview: EF 49%, no ischemia/infarct->Med Rx.  . GERD (gastroesophageal reflux disease)   . Gout   . H/O transfusion of whole blood   . History of PFTs    PFTs 6/16:  FVC 3.73 (87%), FEV1 2.93 (88%), FEV1/FVC 78%, DLCO 69%  . Hx of adenomatous colonic polyps 07/02/2016  . Hyperlipidemia   . Hypertension   . Hypothyroidism   . Iron deficiency anemia   . PAF (paroxysmal atrial fibrillation) ([place])    a.  09/2014: Converted on Dilt;  b. CHA2DS2VASc = 3-->eliquis;  c. 09/2014 Echo: EF 55-60%, mild LVH, mildly dil LA.  [name] Personal history of colonic polyps 2007, 2008   adenoma each time, largest 12 mm in 2007  . Psoriasis   . Type II diabetes mellitus ([place])    TYPE 2    Social History  Substance Use Topics  . Smoking status: Former Smoker    Packs/day: 0.70    Years: 48.00    Types: Cigarettes    Quit date: 03/29/2006  . Smokeless tobacco: Never Used  . Alcohol use 0.6 oz/week    1 Cans of beer per week     Comment: occasional alcohol intake    Family History  Problem Relation Age of Onset  . Heart attack Father   . Lung cancer Father   . Diabetes Father   . Stroke Father   .  Hypertension Father   . Stroke Mother   . Hypertension Mother   . Cancer Brother     liver  . Heart disease Brother     chf  . COPD Sister   . Malignant hyperthermia Neg Hx    Allergies  Allergen Reactions  . Percocet [Oxycodone-Acetaminophen] Nausea And Vomiting  . Invokana [Canagliflozin]     Side effects  . Metformin And Related     Upset stomach    OBJECTIVE: Vitals:   08/01/16 1940 08/02/16 0510 08/02/16 0956 08/02/16 1137  BP: (!) 175/60 (!) 158/64 (!) 191/59 (!) 151/76  Pulse: 72 74 82 88  Resp:  17  18  Temp: 98.4 F (36.9 C) 98.3 F (36.8 C)  99.1 F (37.3 C)  TempSrc: Oral Oral  Oral  SpO2: 98% 97% 98% 98%  Weight:  265 lb 3.4 oz (120.3 kg)    Height:       Body mass index is 36.99 kg/m.  Physical Exam  Constitutional: He is oriented to person, place, and time.  He is resting quietly in bed.  Cardiovascular: Normal rate and regular rhythm.   No murmur heard. Pulmonary/Chest: Effort normal and breath sounds normal.  Abdominal: Soft. There is no tenderness.  Musculoskeletal:  The swelling on the dorsum of his left hand and wrist is improving slowly.  Neurological: He is alert and oriented to person, place, and time.  Psychiatric: Mood and affect normal.    Lab Results Lab Results  Component Value Date   WBC 8.5 07/30/2016   HGB 10.8 (L) 07/30/2016   HCT 32.8 (L) 07/30/2016   MCV 88.6 07/30/2016   PLT 100 (L) 07/30/2016    Lab Results  Component Value Date   CREATININE 2.02 (H) 07/31/2016   BUN 32 (H) 07/31/2016   NA 131 (L) 07/31/2016   K 3.6 07/31/2016   CL 102 07/31/2016   CO2 21 (L) 07/31/2016    Lab Results  Component Value Date   ALT 24 07/30/2016   AST 40 07/30/2016   ALKPHOS 36 (L) 07/30/2016   BILITOT 1.3 (H) 07/30/2016     Microbiology: Recent Results (from the past 240 hour(s))  Culture, blood (Routine X 2) w Reflex to ID Panel     Status: Abnormal   Collection Time: 07/30/16  7:51 AM  Result Value Ref Range Status    Specimen Description BLOOD RIGHT HAND  Final   Special Requests IN PEDIATRIC BOTTLE 1CC  Final   Culture  Setup Time   Final    GRAM POSITIVE COCCI IN CLUSTERS AEROBIC BOTTLE ONLY  CRITICAL VALUE NOTED.  VALUE IS CONSISTENT WITH PREVIOUSLY REPORTED AND CALLED VALUE.    Culture (A)  Final    STAPHYLOCOCCUS AUREUS SUSCEPTIBILITIES PERFORMED ON PREVIOUS CULTURE WITHIN THE LAST 5 DAYS.    Report Status 08/01/2016 FINAL  Final  Culture, blood (Routine X 2) w Reflex to ID Panel     Status: Abnormal   Collection Time: 07/30/16  7:54 AM  Result Value Ref Range Status   Specimen Description BLOOD RIGHT HAND  Final   Special Requests BOTTLES DRAWN AEROBIC AND ANAEROBIC 5CC EA  Final   Culture  Setup Time   Final    GRAM POSITIVE COCCI IN CLUSTERS IN BOTH AEROBIC AND ANAEROBIC BOTTLES CRITICAL RESULT CALLED TO, READ BACK BY AND VERIFIED WITH: J MILLEN PHARMD 2206 07/30/16 A BROWNING    Culture STAPHYLOCOCCUS AUREUS (A)  Final   Report Status 08/01/2016 FINAL  Final   Organism ID, Bacteria STAPHYLOCOCCUS AUREUS  Final      Susceptibility   Staphylococcus aureus - MIC*    CIPROFLOXACIN <=0.5 SENSITIVE Sensitive     ERYTHROMYCIN <=0.25 SENSITIVE Sensitive     GENTAMICIN <=0.5 SENSITIVE Sensitive     OXACILLIN 0.5 SENSITIVE Sensitive     TETRACYCLINE <=1 SENSITIVE Sensitive     VANCOMYCIN <=0.5 SENSITIVE Sensitive     TRIMETH/SULFA <=10 SENSITIVE Sensitive     CLINDAMYCIN <=0.25 SENSITIVE Sensitive     RIFAMPIN <=0.5 SENSITIVE Sensitive     Inducible Clindamycin NEGATIVE Sensitive     * STAPHYLOCOCCUS AUREUS  Blood Culture ID Panel (Reflexed)     Status: Abnormal   Collection Time: 07/30/16  7:54 AM  Result Value Ref Range Status   Enterococcus species NOT DETECTED NOT DETECTED Final   Listeria monocytogenes NOT DETECTED NOT DETECTED Final   Staphylococcus species DETECTED (A) NOT DETECTED Final    Comment: CRITICAL RESULT CALLED TO, READ BACK BY AND VERIFIED WITH: J MILLEN PHARMD 2206  07/30/16 A BROWNING    Staphylococcus aureus DETECTED (A) NOT DETECTED Final    Comment: CRITICAL RESULT CALLED TO, READ BACK BY AND VERIFIED WITH: J MILLEN PHARMD 2206 07/30/16 A BROWNING    Methicillin resistance NOT DETECTED NOT DETECTED Final   Streptococcus species NOT DETECTED NOT DETECTED Final   Streptococcus agalactiae NOT DETECTED NOT DETECTED Final   Streptococcus pneumoniae NOT DETECTED NOT DETECTED Final   Streptococcus pyogenes NOT DETECTED NOT DETECTED Final   Acinetobacter baumannii NOT DETECTED NOT DETECTED Final   Enterobacteriaceae species NOT DETECTED NOT DETECTED Final   Enterobacter cloacae complex NOT DETECTED NOT DETECTED Final   Escherichia coli NOT DETECTED NOT DETECTED Final   Klebsiella oxytoca NOT DETECTED NOT DETECTED Final   Klebsiella pneumoniae NOT DETECTED NOT DETECTED Final   Proteus species NOT DETECTED NOT DETECTED Final   Serratia marcescens NOT DETECTED NOT DETECTED Final   Haemophilus influenzae NOT DETECTED NOT DETECTED Final   Neisseria meningitidis NOT DETECTED NOT DETECTED Final   Pseudomonas aeruginosa NOT DETECTED NOT DETECTED Final   Candida albicans NOT DETECTED NOT DETECTED Final   Candida glabrata NOT DETECTED NOT DETECTED Final   Candida krusei NOT DETECTED NOT DETECTED Final   Candida parapsilosis NOT DETECTED NOT DETECTED Final   Candida tropicalis NOT DETECTED NOT DETECTED Final  MRSA PCR Screening     Status: None   Collection Time: 07/30/16 10:25 AM  Result Value Ref Range Status   MRSA by PCR NEGATIVE NEGATIVE Final    Comment:  The GeneXpert MRSA Assay (FDA approved for NASAL specimens only), is one component of a comprehensive MRSA colonization surveillance program. It is not intended to diagnose MRSA infection nor to guide or monitor treatment for MRSA infections.   Culture, blood (routine x 2)     Status: None (Preliminary result)   Collection Time: 07/31/16 10:51 AM  Result Value Ref Range Status   Specimen  Description BLOOD LEFT ASSIST CONTROL  Final   Special Requests BOTTLES DRAWN AEROBIC ONLY 5CC  Final   Culture NO GROWTH < 24 HOURS  Final   Report Status PENDING  Incomplete  Culture, blood (routine x 2)     Status: None (Preliminary result)   Collection Time: 07/31/16 10:51 AM  Result Value Ref Range Status   Specimen Description BLOOD RIGHT ASSIST CONTROL  Final   Special Requests BOTTLES DRAWN AEROBIC ONLY 5CC  Final   Culture NO GROWTH < 24 HOURS  Final   Report Status PENDING  Incomplete  C difficile quick scan w PCR reflex     Status: Abnormal   Collection Time: 07/31/16 11:49 AM  Result Value Ref Range Status   C Diff antigen POSITIVE (A) NEGATIVE Final   C Diff toxin NEGATIVE NEGATIVE Final   C Diff interpretation Results are indeterminate. See PCR results.  Final  Clostridium Difficile by PCR     Status: Abnormal   Collection Time: 07/31/16 11:49 AM  Result Value Ref Range Status   Toxigenic C Difficile by pcr POSITIVE (A) NEGATIVE Final    Comment: Positive for toxigenic C. difficile with little to no toxin production. Only treat if clinical presentation suggests symptomatic illness.     ASSESSMENT: He has MSSA bacteremia that occurred as a result of septic thrombophlebitis of his left hand. It was detected quickly and treated appropriately. He has no evidence of endocarditis by exam or transthoracic echocardiogram. I think endocarditis is very unlikely and would not proceed with transesophageal echocardiogram. His thrombophlebitis is improving slowly. I think it will continue to resolve without surgery. I plan on 2-3 weeks of IV cefazolin. If his repeat blood cultures remain negative tomorrow morning I will have a PICC placed.  He needs to stay on oral vancomycin even though his C. Difficile toxin PCR was negative. Clinically he is having a relapse and oral vancomycin is indicated while he is on cefazolin.  PLAN: 1. Continue current antibiotics 2. Await results of repeat  blood cultures   Michel Bickers, MD [facility] [place] [phone] pager   [phone] cell 08/02/2016, 1:44 PM

## 2016-08-03 ENCOUNTER — Inpatient Hospital Stay (HOSPITAL_COMMUNITY): Payer: Medicare Other

## 2016-08-03 DIAGNOSIS — M00032 Staphylococcal arthritis, left wrist: Secondary | ICD-10-CM

## 2016-08-03 DIAGNOSIS — I25110 Atherosclerotic heart disease of native coronary artery with unstable angina pectoris: Secondary | ICD-10-CM

## 2016-08-03 DIAGNOSIS — B9561 Methicillin susceptible Staphylococcus aureus infection as the cause of diseases classified elsewhere: Secondary | ICD-10-CM

## 2016-08-03 DIAGNOSIS — Z95828 Presence of other vascular implants and grafts: Secondary | ICD-10-CM

## 2016-08-03 DIAGNOSIS — A0471 Enterocolitis due to Clostridium difficile, recurrent: Secondary | ICD-10-CM

## 2016-08-03 LAB — GLUCOSE, CAPILLARY
GLUCOSE-CAPILLARY: 173 mg/dL — AB (ref 65–99)
GLUCOSE-CAPILLARY: 146 mg/dL — AB (ref 65–99)
Glucose-Capillary: 158 mg/dL — ABNORMAL HIGH (ref 65–99)
Glucose-Capillary: 180 mg/dL — ABNORMAL HIGH (ref 65–99)

## 2016-08-03 LAB — BASIC METABOLIC PANEL
ANION GAP: 8 (ref 5–15)
BUN: 23 mg/dL — AB (ref 6–20)
CHLORIDE: 103 mmol/L (ref 101–111)
CO2: 24 mmol/L (ref 22–32)
Calcium: 7.6 mg/dL — ABNORMAL LOW (ref 8.9–10.3)
Creatinine, Ser: 1.51 mg/dL — ABNORMAL HIGH (ref 0.61–1.24)
GFR calc Af Amer: 51 mL/min — ABNORMAL LOW (ref >60)
GFR, EST NON AFRICAN AMERICAN: 44 mL/min — AB (ref >60)
GLUCOSE: 172 mg/dL — AB (ref 65–99)
POTASSIUM: 3.6 mmol/L (ref 3.5–5.1)
Sodium: 135 mmol/L (ref 135–145)

## 2016-08-03 MED ORDER — SODIUM CHLORIDE 0.9% FLUSH: 10.0000 mL | INTRAVENOUS | Status: DC | PRN

## 2016-08-03 NOTE — Progress Notes (Signed)
PROGRESS NOTE    Michael [name]  ASN:[id number] DOB: 01/09/1941 DOA: 07/27/2016 PCP: [name], MD    Brief Narrative: 76 y.o. male with medical history significant for insulin-dependent diabetes mellitus, hypertension, coronary artery disease, chronic kidney disease stage III, paroxysmal atrial fibrillation on Eliquis, and reported small bowel AVMs who presents to the emergency department with chest pain and dyspnea.  Patient ended up having cardiac catheter evaluation. Subsequently developed fevers and was found to have cellulitis of left wrist as well as Staphylococcus aureus bacteremia.  Pt reports swelling has decreased over arm.  Assessment & Plan:   Principal Problem:   Staphylococcus aureus bacteremia - Patient placed on IV antibiotics continue Ancef - continue current regimen. - ID on board and obtained TTE.  Picc line to be placed once repeat blood cultures negative.  Diarrhea - Pt started on oral vanc while C diff test positive but toxin negative. - continue oral vancomycin  Active Problems:   Pancytopenia (HCC)   DM (diabetes mellitus), type 2 with peripheral vascular complications (HCC) - diabetic diet, SSI    Angiodysplasia of intestinal tract   Hypertriglyceridemia   Carotid stenosis    PAF (paroxysmal atrial fibrillation) ([place]) - Patient currently on Cardizem and beta blocker. Also on eliquis    Hyperlipidemia - stable on lipitor    Essential hypertension - continue cardizem and metroprolol if BP remains consistently elevated will plan on increasing or adding another antihypertensive medication.      Cholelithiasis - Stable    Hypothyroidism - We'll continue Synthroid, stable no hypothyroidism symptoms reported    Hyperkalemia - Resolved    Chronic diastolic CHF (congestive heart failure) (HCC)   Nonrheumatic aortic valve stenosis/NSTEMI (non-ST elevated myocardial infarction) (HCC)/ CAD (coronary artery disease)/ - Cardiology on board and  managing medication recommendations. Of note cardiology recommending aspirin plus Plavix plus apixaban for 1 month then stopping the aspirin     PAD (peripheral artery disease) (HCC)   Septic thrombophlebitis of upper extremity   History of Clostridium difficile colitis  DVT prophylaxis: pt on Eliquis Code Status: Full Family Communication: d/c patient directly Disposition Plan: pending results and final recommendations from ID   Consultants:   ID  Cardiology  Procedures: Cardiac cath  Antimicrobials: Cefazolin, Vancomycin   Subjective: Pt has no new complaints this morning.   Objective: Vitals:   08/02/16 2145 08/02/16 2208 08/03/16 0528 08/03/16 1300  BP: (!) 179/71  (!) 148/71 (!) 167/68  Pulse: 81 81 71 (!) 117  Resp:  18 18   Temp:  98.2 F (36.8 C) 97.7 F (36.5 C) 97.9 F (36.6 C)  TempSrc:  Oral Oral Oral  SpO2:  95% 94% 98%  Weight:   122 kg (268 lb 15.4 oz)   Height:        Intake/Output Summary (Last 24 hours) at 08/03/16 1455 Last data filed at 08/03/16 0700  Gross per 24 hour  Intake           1707.5 ml  Output                0 ml  Net           1707.5 ml   Filed Weights   07/31/16 0355 08/02/16 0510 08/03/16 0528  Weight: 118.4 kg (261 lb 0.4 oz) 120.3 kg (265 lb 3.4 oz) 122 kg (268 lb 15.4 oz)    Examination:  General exam: Appears calm and comfortable  Respiratory system: Clear to auscultation. Respiratory effort normal. Cardiovascular  system: S1 & S2 heard, RRR. No JVD, murmurs, rubs, gallops or clicks. No pedal edema. Gastrointestinal system: Abdomen is nondistended, soft and nontender. No organomegaly or masses felt. Normal bowel sounds heard. Central nervous system: Alert and oriented. No focal neurological deficits. Extremities: Symmetric 5 x 5 power. Skin: No lesions or ulcers on limited exam. Warm and dry. Pt has cellulitis over left forearm Psychiatry: Judgement and insight appear normal. Mood & affect appropriate.   Data  Reviewed: I have personally reviewed following labs and imaging studies  CBC:  Recent Labs Lab 07/28/16 0258 07/29/16 0122 07/30/16 0346  WBC 5.4 6.0 8.5  HGB 11.8* 12.3* 10.8*  HCT 33.8* 34.9* 32.8*  MCV 84.5 85.5 88.6  PLT 129* 141* 619*   Basic Metabolic Panel:  Recent Labs Lab 07/28/16 0258 07/29/16 0122 07/30/16 0346 07/31/16 0455 08/03/16 0420  NA 134* 135 134* 131* 135  K 4.0 4.0 4.0 3.6 3.6  CL 102 102 103 102 103  CO2 25 25 18* 21* 24  GLUCOSE 205* 225* 250* 204* 172*  BUN 22* 22* 29* 32* 23*  CREATININE 1.49* 1.56* 2.03* 2.02* 1.51*  CALCIUM 8.8* 8.9 8.4* 7.5* 7.6*   GFR: Estimated Creatinine Clearance: 57.9 mL/min (by C-G formula based on SCr of 1.51 mg/dL (H)). Liver Function Tests:  Recent Labs Lab 07/30/16 0751  AST 40  ALT 24  ALKPHOS 36*  BILITOT 1.3*  PROT 6.7  ALBUMIN 3.5   No results for input(s): LIPASE, AMYLASE in the last 168 hours. No results for input(s): AMMONIA in the last 168 hours. Coagulation Profile:  Recent Labs Lab 07/29/16 0122  INR 1.01   Cardiac Enzymes:  Recent Labs Lab 07/27/16 1641 07/28/16 1417 07/28/16 1819 07/29/16 0122  TROPONINI 1.63* 1.88* 1.58* 1.78*   BNP (last 3 results) No results for input(s): PROBNP in the last 8760 hours. HbA1C: No results for input(s): HGBA1C in the last 72 hours. CBG:  Recent Labs Lab 08/02/16 1135 08/02/16 1652 08/02/16 2159 08/03/16 0740 08/03/16 1322  GLUCAP 204* 176* 154* 158* 180*   Lipid Profile:  Recent Labs  08/01/16 0440  CHOL 101  HDL 22*  LDLCALC 10  TRIG 344*  CHOLHDL 4.6   Thyroid Function Tests: No results for input(s): TSH, T4TOTAL, FREET4, T3FREE, THYROIDAB in the last 72 hours. Anemia Panel: No results for input(s): VITAMINB12, FOLATE, FERRITIN, TIBC, IRON, RETICCTPCT in the last 72 hours. Sepsis Labs: No results for input(s): PROCALCITON, LATICACIDVEN in the last 168 hours.  Recent Results (from the past 240 hour(s))  Culture,  blood (Routine X 2) w Reflex to ID Panel     Status: Abnormal   Collection Time: 07/30/16  7:51 AM  Result Value Ref Range Status   Specimen Description BLOOD RIGHT HAND  Final   Special Requests IN PEDIATRIC BOTTLE 1CC  Final   Culture  Setup Time   Final    GRAM POSITIVE COCCI IN CLUSTERS AEROBIC BOTTLE ONLY CRITICAL VALUE NOTED.  VALUE IS CONSISTENT WITH PREVIOUSLY REPORTED AND CALLED VALUE.    Culture (A)  Final    STAPHYLOCOCCUS AUREUS SUSCEPTIBILITIES PERFORMED ON PREVIOUS CULTURE WITHIN THE LAST 5 DAYS.    Report Status 08/01/2016 FINAL  Final  Culture, blood (Routine X 2) w Reflex to ID Panel     Status: Abnormal   Collection Time: 07/30/16  7:54 AM  Result Value Ref Range Status   Specimen Description BLOOD RIGHT HAND  Final   Special Requests BOTTLES DRAWN AEROBIC AND ANAEROBIC 5CC EA  Final   Culture  Setup Time   Final    GRAM POSITIVE COCCI IN CLUSTERS IN BOTH AEROBIC AND ANAEROBIC BOTTLES CRITICAL RESULT CALLED TO, READ BACK BY AND VERIFIED WITH: J MILLEN PHARMD 2206 07/30/16 A BROWNING    Culture STAPHYLOCOCCUS AUREUS (A)  Final   Report Status 08/01/2016 FINAL  Final   Organism ID, Bacteria STAPHYLOCOCCUS AUREUS  Final      Susceptibility   Staphylococcus aureus - MIC*    CIPROFLOXACIN <=0.5 SENSITIVE Sensitive     ERYTHROMYCIN <=0.25 SENSITIVE Sensitive     GENTAMICIN <=0.5 SENSITIVE Sensitive     OXACILLIN 0.5 SENSITIVE Sensitive     TETRACYCLINE <=1 SENSITIVE Sensitive     VANCOMYCIN <=0.5 SENSITIVE Sensitive     TRIMETH/SULFA <=10 SENSITIVE Sensitive     CLINDAMYCIN <=0.25 SENSITIVE Sensitive     RIFAMPIN <=0.5 SENSITIVE Sensitive     Inducible Clindamycin NEGATIVE Sensitive     * STAPHYLOCOCCUS AUREUS  Blood Culture ID Panel (Reflexed)     Status: Abnormal   Collection Time: 07/30/16  7:54 AM  Result Value Ref Range Status   Enterococcus species NOT DETECTED NOT DETECTED Final   Listeria monocytogenes NOT DETECTED NOT DETECTED Final   Staphylococcus  species DETECTED (A) NOT DETECTED Final    Comment: CRITICAL RESULT CALLED TO, READ BACK BY AND VERIFIED WITH: J MILLEN PHARMD 2206 07/30/16 A BROWNING    Staphylococcus aureus DETECTED (A) NOT DETECTED Final    Comment: CRITICAL RESULT CALLED TO, READ BACK BY AND VERIFIED WITH: J MILLEN PHARMD 2206 07/30/16 A BROWNING    Methicillin resistance NOT DETECTED NOT DETECTED Final   Streptococcus species NOT DETECTED NOT DETECTED Final   Streptococcus agalactiae NOT DETECTED NOT DETECTED Final   Streptococcus pneumoniae NOT DETECTED NOT DETECTED Final   Streptococcus pyogenes NOT DETECTED NOT DETECTED Final   Acinetobacter baumannii NOT DETECTED NOT DETECTED Final   Enterobacteriaceae species NOT DETECTED NOT DETECTED Final   Enterobacter cloacae complex NOT DETECTED NOT DETECTED Final   Escherichia coli NOT DETECTED NOT DETECTED Final   Klebsiella oxytoca NOT DETECTED NOT DETECTED Final   Klebsiella pneumoniae NOT DETECTED NOT DETECTED Final   Proteus species NOT DETECTED NOT DETECTED Final   Serratia marcescens NOT DETECTED NOT DETECTED Final   Haemophilus influenzae NOT DETECTED NOT DETECTED Final   Neisseria meningitidis NOT DETECTED NOT DETECTED Final   Pseudomonas aeruginosa NOT DETECTED NOT DETECTED Final   Candida albicans NOT DETECTED NOT DETECTED Final   Candida glabrata NOT DETECTED NOT DETECTED Final   Candida krusei NOT DETECTED NOT DETECTED Final   Candida parapsilosis NOT DETECTED NOT DETECTED Final   Candida tropicalis NOT DETECTED NOT DETECTED Final  MRSA PCR Screening     Status: None   Collection Time: 07/30/16 10:25 AM  Result Value Ref Range Status   MRSA by PCR NEGATIVE NEGATIVE Final    Comment:        The GeneXpert MRSA Assay (FDA approved for NASAL specimens only), is one component of a comprehensive MRSA colonization surveillance program. It is not intended to diagnose MRSA infection nor to guide or monitor treatment for MRSA infections.   Culture, blood  (routine x 2)     Status: None (Preliminary result)   Collection Time: 07/31/16 10:51 AM  Result Value Ref Range Status   Specimen Description BLOOD LEFT ASSIST CONTROL  Final   Special Requests BOTTLES DRAWN AEROBIC ONLY 5CC  Final   Culture NO GROWTH 2 DAYS  Final  Report Status PENDING  Incomplete  Culture, blood (routine x 2)     Status: None (Preliminary result)   Collection Time: 07/31/16 10:51 AM  Result Value Ref Range Status   Specimen Description BLOOD RIGHT ASSIST CONTROL  Final   Special Requests BOTTLES DRAWN AEROBIC ONLY 5CC  Final   Culture NO GROWTH 2 DAYS  Final   Report Status PENDING  Incomplete  C difficile quick scan w PCR reflex     Status: Abnormal   Collection Time: 07/31/16 11:49 AM  Result Value Ref Range Status   C Diff antigen POSITIVE (A) NEGATIVE Final   C Diff toxin NEGATIVE NEGATIVE Final   C Diff interpretation Results are indeterminate. See PCR results.  Final  Clostridium Difficile by PCR     Status: Abnormal   Collection Time: 07/31/16 11:49 AM  Result Value Ref Range Status   Toxigenic C Difficile by pcr POSITIVE (A) NEGATIVE Final    Comment: Positive for toxigenic C. difficile with little to no toxin production. Only treat if clinical presentation suggests symptomatic illness.         Radiology Studies: Dg Chest Port 1 View  Result Date: 08/03/2016 CLINICAL DATA:  PICC line placement. EXAM: PORTABLE CHEST 1 VIEW COMPARISON:  07/30/2016 and 07/27/2016. FINDINGS: 1348 hours. Right arm PICC projects to the mid SVC level. The catheter appears to extend into the azygos vein, suboptimally evaluated on this single AP view. Stable cardiomegaly and aortic atherosclerosis. There is mild atelectasis at both lung bases. No confluent airspace opacity, pneumothorax or significant pleural effusion. IMPRESSION: Right arm PICC appears to extend into the azygos vein on this single portable AP examination. Catheter repositioning prior to use and radiographic  follow up recommended. These results will be called to the ordering clinician or representative by the Radiologist Assistant, and communication documented in the PACS or zVision Dashboard. Electronically Signed   By: Richardean Sale M.D.   On: 08/03/2016 14:06        Scheduled Meds: . allopurinol  200 mg Oral Daily  . amiodarone  200 mg Oral Daily  . apixaban  5 mg Oral BID  . aspirin  81 mg Oral Daily  . atorvastatin  40 mg Oral q1800  .  ceFAZolin (ANCEF) IV  2 g Intravenous Q8H  . clopidogrel  75 mg Oral Q breakfast  . diltiazem  180 mg Oral Daily  . fenofibrate  160 mg Oral Daily  . insulin aspart  0-15 Units Subcutaneous TID WC  . insulin aspart  0-5 Units Subcutaneous QHS  . insulin glargine  60 Units Subcutaneous Daily  . levothyroxine  150 mcg Oral QAC breakfast  . metoprolol  75 mg Oral BID  . sodium chloride flush  3 mL Intravenous Q12H  . vancomycin  125 mg Oral Q6H   Continuous Infusions: . sodium chloride 75 mL/hr at 08/02/16 1714     LOS: 7 days    Time spent: > 35 minutes  Velvet Bathe, MD [facility] Pager [phone]  If 7PM-7AM, please contact night-coverage [email or web address] Password [email or web address] 08/03/2016, 2:55 PM

## 2016-08-03 NOTE — Progress Notes (Signed)
CARDIAC REHAB PHASE I   PRE:  Rate/Rhythm: 69 (dinamap) NT  BP:  Sitting: 142/83        SaO2: 96 RA  MODE:  Ambulation: 220 ft   POST:  Rate/Rhythm: 95  BP:  Sitting: 142/94         SaO2: 100 RA  Pt ambulated 220 ft on RA, IV, handheld assist x2, steady gait, tolerated fairly well.  Pt c/o DOE, general fatigue, denies cp, dizziness, standing rest x1. Continued MI/stent education with pt and wife at bedside.  Reviewed risk factors, MI/PCI book, anti-platelet therapy, stent card, activity restrictions, ntg, exercise, and phase 2 cardiac rehab. Pt and wife erbalized understanding, receptive to education. Pt agrees to phase 2 cardiac rehab referral, will send to [facility] per pt request. Pt has heart healthy and diabetes handouts, will plan to review tomorrow, IV team at bedside to place PICC. Pt to bed for PICC placement after walk, call bell within reach. Will follow up tomorrow. Can be x1.  [phone] Lenna Sciara, RN, BSN 08/03/2016 11:38 AM

## 2016-08-03 NOTE — Progress Notes (Signed)
0715 IV to Left forearm infiltrated, stopped IVF. Warm compress applied to left arm. Old above the left wrist phlebitis, red and swollen. Warm compress applied.

## 2016-08-03 NOTE — Progress Notes (Signed)
Physical Therapy Treatment Patient Details Name: Michael [name] MRN: [id number] DOB: Jan 31, 1941 Today's Date: 08/03/2016    History of Present Illness Patient is a 76 y/o male with hx of HTN, HLD, DM, hypothyroidism, gout, CAD, AVM of colon, CKD presents with CP and SOB. S/p cardiac cath. Found to have fevers and cellulitis of left wrist as well as Staphylococcus aureus bacteremia    PT Comments    Patient continues to progress toward mobility goals. Pt safely able to manage stairs. Wife present for session. Current plan remains appropriate.   Follow Up Recommendations  No PT follow up;Supervision - Intermittent     Equipment Recommendations  None recommended by PT    Recommendations for Other Services       Precautions / Restrictions Precautions Precautions: Fall Restrictions Weight Bearing Restrictions: No    Mobility  Bed Mobility Overal bed mobility: Needs Assistance Bed Mobility: Supine to Sit;Sit to Supine     Supine to sit: Supervision Sit to supine: Supervision   General bed mobility comments: increased time and effort; no rails; supervision for line management  Transfers Overall transfer level: Needs assistance Equipment used: None Transfers: Sit to/from Stand Sit to Stand: Supervision         General transfer comment: supervision for safety  Ambulation/Gait Ambulation/Gait assistance: Supervision Ambulation Distance (Feet): 275 Feet Assistive device: None Gait Pattern/deviations: Step-through pattern;Decreased stride length;Wide base of support Gait velocity: decreased   General Gait Details: slow, steady gait; cues to ensure safety of L UE while managing obstacles in hallway; pt kept L UE flexed and close to chest for comfort; 3 brief standing rest breaks   Stairs Stairs: Yes   Stair Management: One rail Right;Step to pattern;Sideways Number of Stairs: 4 General stair comments: cues for step to pattern and safety; pt used R UE and rail;  forward to ascend and sidewayse to descend  Wheelchair Mobility    Modified Rankin (Stroke Patients Only)       Balance   Sitting-balance support: Feet supported;No upper extremity supported Sitting balance-Leahy Scale: Good       Standing balance-Leahy Scale: Good                      Cognition Arousal/Alertness: Awake/alert Behavior During Therapy: WFL for tasks assessed/performed Overall Cognitive Status: Within Functional Limits for tasks assessed                      Exercises      General Comments General comments (skin integrity, edema, etc.): wife present for session      Pertinent Vitals/Pain Pain Assessment: Faces Faces Pain Scale: Hurts even more Pain Location: L UE Pain Descriptors / Indicators: Sore;Grimacing;Guarding Pain Intervention(s): Limited activity within patient's tolerance;Monitored during session;Premedicated before session;Repositioned;Heat applied    Home Living                      Prior Function            PT Goals (current goals can now be found in the care plan section) Acute Rehab PT Goals Patient Stated Goal: get better and go home Progress towards PT goals: Progressing toward goals    Frequency    Min 3X/week      PT Plan Current plan remains appropriate    Co-evaluation             End of Session Equipment Utilized During Treatment: Gait belt Activity Tolerance: Patient tolerated  treatment well Patient left: in bed;with call bell/phone within reach;with family/visitor present     Time: 1610-9604 PT Time Calculation (min) (ACUTE ONLY): 25 min  Charges:  $Gait Training: 8-22 mins $Therapeutic Activity: 8-22 mins                    G Codes:      [place] April, PTA Pager: [phone]   08/03/2016, 4:18 PM

## 2016-08-03 NOTE — Care Management Note (Signed)
Case Management Note  Patient Details  Name: Michael [name] MRN: [id number] Date of Birth: May 04, 1941  Subjective/Objective:                    Action/Plan:   Expected Discharge Date:                  Expected Discharge Plan:  [place]  In-House Referral:     Discharge planning Services  CM Consult  Post Acute Care Choice:    Choice offered to:  Patient, Spouse  DME Arranged:    DME Agency:     HH Arranged:  RN, IV Antibiotics HH Agency:  [place]  Status of Service:  Completed, signed off  If discussed at [place] of Stay Meetings, dates discussed:    Additional Comments:  Marilu Favre, RN 08/03/2016, 3:48 PM

## 2016-08-03 NOTE — Progress Notes (Signed)
Peripherally Inserted Central Catheter/Midline Placement  The IV Nurse has discussed with the patient and/or persons authorized to consent for the patient, the purpose of this procedure and the potential benefits and risks involved with this procedure.  The benefits include less needle sticks, lab draws from the catheter, and the patient may be discharged home with the catheter. Risks include, but not limited to, infection, bleeding, blood clot (thrombus formation), and puncture of an artery; nerve damage and irregular heartbeat and possibility to perform a PICC exchange if needed/ordered by physician.  Alternatives to this procedure were also discussed.  Bard Power PICC patient education guide, fact sheet on infection prevention and patient information card has been provided to patient /or left at bedside.    PICC/Midline Placement Documentation  PICC Single Lumen 62/95/28 PICC Right Basilic 0 cm (Active)  Indication for Insertion or Continuance of Line Home intravenous therapies (PICC only) 08/03/2016  1:00 PM  Exposed Catheter (cm) 0 cm 08/03/2016  1:00 PM  Dressing Change Due 08/10/16 08/03/2016  1:00 PM       Jule Economy Horton 08/03/2016, 1:00 PM

## 2016-08-03 NOTE — Progress Notes (Signed)
Patient Name: Michael [name] Date of Encounter: 08/03/2016  Primary Cardiologist: [facility] Problem List     Principal Problem:   Staphylococcus aureus bacteremia Active Problems:   Pancytopenia ([place])   DM (diabetes mellitus), type 2 with peripheral vascular complications (HCC)   Angiodysplasia of intestinal tract   Hypertriglyceridemia   Carotid stenosis   PAF (paroxysmal atrial fibrillation) (HCC)   Hyperlipidemia   Essential hypertension   Cholelithiasis   Hypothyroidism   Hyperkalemia   Chronic diastolic CHF (congestive heart failure) (HCC)   Nonrheumatic aortic valve stenosis   NSTEMI (non-ST elevated myocardial infarction) ([place])   CAD (coronary artery disease)   PAD (peripheral artery disease) (HCC)   Septic thrombophlebitis of upper extremity   History of Clostridium difficile colitis     Subjective   No chest pain. No dyspnea.   Inpatient Medications    Scheduled Meds: . allopurinol  200 mg Oral Daily  . amiodarone  200 mg Oral Daily  . apixaban  5 mg Oral BID  . aspirin  81 mg Oral Daily  . atorvastatin  40 mg Oral q1800  .  ceFAZolin (ANCEF) IV  2 g Intravenous Q8H  . clopidogrel  75 mg Oral Q breakfast  . diltiazem  180 mg Oral Daily  . fenofibrate  160 mg Oral Daily  . insulin aspart  0-15 Units Subcutaneous TID WC  . insulin aspart  0-5 Units Subcutaneous QHS  . insulin glargine  60 Units Subcutaneous Daily  . levothyroxine  150 mcg Oral QAC breakfast  . metoprolol  75 mg Oral BID  . sodium chloride flush  3 mL Intravenous Q12H  . vancomycin  125 mg Oral Q6H   Continuous Infusions: . sodium chloride 75 mL/hr at 08/02/16 1714   PRN Meds: sodium chloride, acetaminophen, hydrALAZINE, HYDROcodone-acetaminophen, HYDROcodone-acetaminophen, meclizine, nitroGLYCERIN, ondansetron (ZOFRAN) IV, sodium chloride flush   Vital Signs    Vitals:   08/02/16 1458 08/02/16 2145 08/02/16 2208 08/03/16 0528  BP: (!) 152/63 (!) 179/71  (!) 148/71    Pulse: 75 81 81 71  Resp:   18 18  Temp: (!) 100.4 F (38 C)  98.2 F (36.8 C) 97.7 F (36.5 C)  TempSrc: Oral  Oral Oral  SpO2: 96%  95% 94%  Weight:    268 lb 15.4 oz (122 kg)  Height:        Intake/Output Summary (Last 24 hours) at 08/03/16 1052 Last data filed at 08/03/16 0700  Gross per 24 hour  Intake           2107.5 ml  Output                0 ml  Net           2107.5 ml   Filed Weights   07/31/16 0355 08/02/16 0510 08/03/16 0528  Weight: 261 lb 0.4 oz (118.4 kg) 265 lb 3.4 oz (120.3 kg) 268 lb 15.4 oz (122 kg)    Physical Exam  GEN: Well nourished, well developed, in no acute distress.  HEENT: Grossly normal.  Neck: Supple, no JVD, carotid bruits, or masses. Cardiac: RRR, no murmurs, rubs, or gallops. No clubbing, cyanosis, edema.  Radials/DP/PT 2+ and equal bilaterally.  Respiratory:  Respirations regular and unlabored, clear to auscultation bilaterally. GI: Soft, nontender, nondistended, BS + x 4. MS: no deformity or atrophy. Skin: warm and dry, no rash. Neuro:  Strength and sensation are intact. Psych: AAOx3.  Normal affect.  Labs  CBC No results for input(s): WBC, NEUTROABS, HGB, HCT, MCV, PLT in the last 72 hours. Basic Metabolic Panel  Recent Labs  08/03/16 0420  NA 135  K 3.6  CL 103  CO2 24  GLUCOSE 172*  BUN 23*  CREATININE 1.51*  CALCIUM 7.6*   Fasting Lipid Panel  Recent Labs  08/01/16 0440  CHOL 101  HDL 22*  LDLCALC 10  TRIG 344*  CHOLHDL 4.6   Telemetry    Pt not on tele  Radiology    No results found.   Patient Profile     76 year old male with a past medical history of non obstructive CAD, PAF on eliquis, prior hx of GI bleed due to AVM requiring blood transfusion, CKD stage III, HTN, HLD, IDA, DM, bilateral carotid stenosisand right subclavian artery stenosis. He was admitted on 07/27/16 with SOB and chest pain.   Assessment & Plan    1. CAD/NSTEMI: Admitted with chest pain, elevated troponin. Cardiac cath  07/29/16 with severe stenosis OM1 treated with a DES. He will be on triple therapy with ASA+ Plavix+ Apixaban for one month, then will stop ASA. Continue high intensity statin and fenofibrate. Will continue metoprolol.   2. PAF: Maintaining sinus rhythm. CHADSVASc score of 5. Continue amiodarone. Continue Eliquisfor anticoagulation.  He can f/u with Dr. Johnsie Cancel after discharge. We will sign off. Please call us at time of discharge and we can help arrange f/u.   Signed, Lauree Chandler, MD  08/03/2016, 10:52 AM

## 2016-08-03 NOTE — Progress Notes (Signed)
Patient ID: Michael [name], male   DOB: [birth year], 75 y.o.   MRN: [id number]          [facility]  Date of Admission:  07/27/2016           Day 4 cefazolin        Day 4 oral vancomycin  Principal Problem:   Staphylococcus aureus bacteremia Active Problems:   Septic thrombophlebitis of upper extremity   History of Clostridium difficile colitis   Pancytopenia (HCC)   DM (diabetes mellitus), type 2 with peripheral vascular complications (HCC)   Angiodysplasia of intestinal tract   Hypertriglyceridemia   Carotid stenosis   PAF (paroxysmal atrial fibrillation) (HCC)   Hyperlipidemia   Essential hypertension   Cholelithiasis   Hypothyroidism   Hyperkalemia   Chronic diastolic CHF (congestive heart failure) ([place])   Nonrheumatic aortic valve stenosis   NSTEMI (non-ST elevated myocardial infarction) ([place])   CAD (coronary artery disease)   PAD (peripheral artery disease) ([place])   . allopurinol  200 mg Oral Daily  . amiodarone  200 mg Oral Daily  . apixaban  5 mg Oral BID  . aspirin  81 mg Oral Daily  . atorvastatin  40 mg Oral q1800  .  ceFAZolin (ANCEF) IV  2 g Intravenous Q8H  . clopidogrel  75 mg Oral Q breakfast  . diltiazem  180 mg Oral Daily  . fenofibrate  160 mg Oral Daily  . insulin aspart  0-15 Units Subcutaneous TID WC  . insulin aspart  0-5 Units Subcutaneous QHS  . insulin glargine  60 Units Subcutaneous Daily  . levothyroxine  150 mcg Oral QAC breakfast  . metoprolol  75 mg Oral BID  . sodium chloride flush  3 mL Intravenous Q12H  . vancomycin  125 mg Oral Q6H    SUBJECTIVE: He is still having some intermittent night sweats and nausea but overall is feeling better and is eager to go home. His left hand is still painful but improving. He is having a lot of gas but no more diarrhea.  Review of Systems: Review of Systems  Constitutional: Positive for diaphoresis and fever. Negative for chills.  Respiratory: Negative for cough, sputum  production and shortness of breath.   Cardiovascular: Negative for chest pain.  Gastrointestinal: Negative for abdominal pain, diarrhea, nausea and vomiting.  Musculoskeletal: Positive for joint pain.    Past Medical History:  Diagnosis Date  . Allergy   . Arthritis   . AVM (arteriovenous malformation) of colon   . CAD (coronary artery disease)    a. Cath 10/2014 - Mild to moderate diagonal, circumflex and obtuse marginal disease. Innominate artery sternosis.  . Carotid artery disease ([place])    a. Carotid dopple 03/2014 56-25% RICA &  63-89% LICA stenosis b. 3/73  . CKD (chronic kidney disease), stage III   . Clostridium difficile colitis 05/13/2016  . Dysrhythmia    ATRIAL FIBRILATION  . Elevated troponin    a. 09/2014 in setting of AF RVR-->Myoview: EF 49%, no ischemia/infarct->Med Rx.  . GERD (gastroesophageal reflux disease)   . Gout   . H/O transfusion of whole blood   . History of PFTs    PFTs 6/16:  FVC 3.73 (87%), FEV1 2.93 (88%), FEV1/FVC 78%, DLCO 69%  . Hx of adenomatous colonic polyps 07/02/2016  . Hyperlipidemia   . Hypertension   . Hypothyroidism   . Iron deficiency anemia   . PAF (paroxysmal atrial fibrillation) ([place])    a. 09/2014:  Converted on Dilt;  b. CHA2DS2VASc = 3-->eliquis;  c. 09/2014 Echo: EF 55-60%, mild LVH, mildly dil LA.  [name] Personal history of colonic polyps 2007, 2008   adenoma each time, largest 12 mm in 2007  . Psoriasis   . Type II diabetes mellitus ([place])    TYPE 2    Social History  Substance Use Topics  . Smoking status: Former Smoker    Packs/day: 0.70    Years: 48.00    Types: Cigarettes    Quit date: 03/29/2006  . Smokeless tobacco: Never Used  . Alcohol use 0.6 oz/week    1 Cans of beer per week     Comment: occasional alcohol intake    Family History  Problem Relation Age of Onset  . Heart attack Father   . Lung cancer Father   . Diabetes Father   . Stroke Father   . Hypertension Father   . Stroke Mother   . Hypertension  Mother   . Cancer Brother     liver  . Heart disease Brother     chf  . COPD Sister   . Malignant hyperthermia Neg Hx    Allergies  Allergen Reactions  . Percocet [Oxycodone-Acetaminophen] Nausea And Vomiting  . Invokana [Canagliflozin]     Side effects  . Metformin And Related     Upset stomach    OBJECTIVE: Vitals:   08/02/16 1458 08/02/16 2145 08/02/16 2208 08/03/16 0528  BP: (!) 152/63 (!) 179/71  (!) 148/71  Pulse: 75 81 81 71  Resp:   18 18  Temp: (!) 100.4 F (38 C)  98.2 F (36.8 C) 97.7 F (36.5 C)  TempSrc: Oral  Oral Oral  SpO2: 96%  95% 94%  Weight:    268 lb 15.4 oz (122 kg)  Height:       Body mass index is 37.51 kg/m.  Physical Exam  Constitutional: He is oriented to person, place, and time.  He is in better spirits.  Cardiovascular: Normal rate and regular rhythm.   No murmur heard. Pulmonary/Chest: Effort normal and breath sounds normal.  Abdominal: Soft. There is no tenderness.  Musculoskeletal:  Redness and swelling on the dorsum of his left hand is improving.  Neurological: He is alert and oriented to person, place, and time.  Skin: No rash noted.  New right arm PICC.  Psychiatric: Mood and affect normal.    Lab Results Lab Results  Component Value Date   WBC 8.5 07/30/2016   HGB 10.8 (L) 07/30/2016   HCT 32.8 (L) 07/30/2016   MCV 88.6 07/30/2016   PLT 100 (L) 07/30/2016    Lab Results  Component Value Date   CREATININE 1.51 (H) 08/03/2016   BUN 23 (H) 08/03/2016   NA 135 08/03/2016   K 3.6 08/03/2016   CL 103 08/03/2016   CO2 24 08/03/2016    Lab Results  Component Value Date   ALT 24 07/30/2016   AST 40 07/30/2016   ALKPHOS 36 (L) 07/30/2016   BILITOT 1.3 (H) 07/30/2016     Microbiology: Recent Results (from the past 240 hour(s))  Culture, blood (Routine X 2) w Reflex to ID Panel     Status: Abnormal   Collection Time: 07/30/16  7:51 AM  Result Value Ref Range Status   Specimen Description BLOOD RIGHT HAND  Final     Special Requests IN PEDIATRIC BOTTLE [place]  Final   Culture  Setup Time   Final    GRAM POSITIVE COCCI  IN CLUSTERS AEROBIC BOTTLE ONLY CRITICAL VALUE NOTED.  VALUE IS CONSISTENT WITH PREVIOUSLY REPORTED AND CALLED VALUE.    Culture (A)  Final    STAPHYLOCOCCUS AUREUS SUSCEPTIBILITIES PERFORMED ON PREVIOUS CULTURE WITHIN THE LAST 5 DAYS.    Report Status 08/01/2016 FINAL  Final  Culture, blood (Routine X 2) w Reflex to ID Panel     Status: Abnormal   Collection Time: 07/30/16  7:54 AM  Result Value Ref Range Status   Specimen Description BLOOD RIGHT HAND  Final   Special Requests BOTTLES DRAWN AEROBIC AND ANAEROBIC 5CC EA  Final   Culture  Setup Time   Final    GRAM POSITIVE COCCI IN CLUSTERS IN BOTH AEROBIC AND ANAEROBIC BOTTLES CRITICAL RESULT CALLED TO, READ BACK BY AND VERIFIED WITH: J MILLEN PHARMD 2206 07/30/16 A BROWNING    Culture STAPHYLOCOCCUS AUREUS (A)  Final   Report Status 08/01/2016 FINAL  Final   Organism ID, Bacteria STAPHYLOCOCCUS AUREUS  Final      Susceptibility   Staphylococcus aureus - MIC*    CIPROFLOXACIN <=0.5 SENSITIVE Sensitive     ERYTHROMYCIN <=0.25 SENSITIVE Sensitive     GENTAMICIN <=0.5 SENSITIVE Sensitive     OXACILLIN 0.5 SENSITIVE Sensitive     TETRACYCLINE <=1 SENSITIVE Sensitive     VANCOMYCIN <=0.5 SENSITIVE Sensitive     TRIMETH/SULFA <=10 SENSITIVE Sensitive     CLINDAMYCIN <=0.25 SENSITIVE Sensitive     RIFAMPIN <=0.5 SENSITIVE Sensitive     Inducible Clindamycin NEGATIVE Sensitive     * STAPHYLOCOCCUS AUREUS  Blood Culture ID Panel (Reflexed)     Status: Abnormal   Collection Time: 07/30/16  7:54 AM  Result Value Ref Range Status   Enterococcus species NOT DETECTED NOT DETECTED Final   Listeria monocytogenes NOT DETECTED NOT DETECTED Final   Staphylococcus species DETECTED (A) NOT DETECTED Final    Comment: CRITICAL RESULT CALLED TO, READ BACK BY AND VERIFIED WITH: J MILLEN PHARMD 2206 07/30/16 A BROWNING    Staphylococcus aureus  DETECTED (A) NOT DETECTED Final    Comment: CRITICAL RESULT CALLED TO, READ BACK BY AND VERIFIED WITH: J MILLEN PHARMD 2206 07/30/16 A BROWNING    Methicillin resistance NOT DETECTED NOT DETECTED Final   Streptococcus species NOT DETECTED NOT DETECTED Final   Streptococcus agalactiae NOT DETECTED NOT DETECTED Final   Streptococcus pneumoniae NOT DETECTED NOT DETECTED Final   Streptococcus pyogenes NOT DETECTED NOT DETECTED Final   Acinetobacter baumannii NOT DETECTED NOT DETECTED Final   Enterobacteriaceae species NOT DETECTED NOT DETECTED Final   Enterobacter cloacae complex NOT DETECTED NOT DETECTED Final   Escherichia coli NOT DETECTED NOT DETECTED Final   Klebsiella oxytoca NOT DETECTED NOT DETECTED Final   Klebsiella pneumoniae NOT DETECTED NOT DETECTED Final   Proteus species NOT DETECTED NOT DETECTED Final   Serratia marcescens NOT DETECTED NOT DETECTED Final   Haemophilus influenzae NOT DETECTED NOT DETECTED Final   Neisseria meningitidis NOT DETECTED NOT DETECTED Final   Pseudomonas aeruginosa NOT DETECTED NOT DETECTED Final   Candida albicans NOT DETECTED NOT DETECTED Final   Candida glabrata NOT DETECTED NOT DETECTED Final   Candida krusei NOT DETECTED NOT DETECTED Final   Candida parapsilosis NOT DETECTED NOT DETECTED Final   Candida tropicalis NOT DETECTED NOT DETECTED Final  MRSA PCR Screening     Status: None   Collection Time: 07/30/16 10:25 AM  Result Value Ref Range Status   MRSA by PCR NEGATIVE NEGATIVE Final    Comment:  The GeneXpert MRSA Assay (FDA approved for NASAL specimens only), is one component of a comprehensive MRSA colonization surveillance program. It is not intended to diagnose MRSA infection nor to guide or monitor treatment for MRSA infections.   Culture, blood (routine x 2)     Status: None (Preliminary result)   Collection Time: 07/31/16 10:51 AM  Result Value Ref Range Status   Specimen Description BLOOD LEFT ASSIST CONTROL  Final     Special Requests BOTTLES DRAWN AEROBIC ONLY 5CC  Final   Culture NO GROWTH 2 DAYS  Final   Report Status PENDING  Incomplete  Culture, blood (routine x 2)     Status: None (Preliminary result)   Collection Time: 07/31/16 10:51 AM  Result Value Ref Range Status   Specimen Description BLOOD RIGHT ASSIST CONTROL  Final   Special Requests BOTTLES DRAWN AEROBIC ONLY 5CC  Final   Culture NO GROWTH 2 DAYS  Final   Report Status PENDING  Incomplete  C difficile quick scan w PCR reflex     Status: Abnormal   Collection Time: 07/31/16 11:49 AM  Result Value Ref Range Status   C Diff antigen POSITIVE (A) NEGATIVE Final   C Diff toxin NEGATIVE NEGATIVE Final   C Diff interpretation Results are indeterminate. See PCR results.  Final  Clostridium Difficile by PCR     Status: Abnormal   Collection Time: 07/31/16 11:49 AM  Result Value Ref Range Status   Toxigenic C Difficile by pcr POSITIVE (A) NEGATIVE Final    Comment: Positive for toxigenic C. difficile with little to no toxin production. Only treat if clinical presentation suggests symptomatic illness.     ASSESSMENT: He is improving on therapy for IV associated septic thrombophlebitis and MSSA bacteremia. I plan on continuing IV cefazolin at least until he follows up with me in clinic on 08/18/2016. I will leave him on oral vancomycin for 1 week after completing systemic antibiotic therapy for MSSA.  PLAN: 1. Continue IV cefazolin and oral vancomycin 2. I will arrange follow-up in my clinic 3. I will sign off now  Diagnosis: 1. Left wrist septic arthritis with MSSA bacteremia 2. Recurrent C. difficile colitis Culture Result: MSSA bacteremia  Allergies  Allergen Reactions  . Percocet [Oxycodone-Acetaminophen] Nausea And Vomiting  . Invokana [Canagliflozin]     Side effects  . Metformin And Related     Upset stomach    Discharge antibiotics: Per pharmacy protocol Cefazolin Aim for Vancomycin trough 15-20 (unless otherwise  indicated) Duration: 2-1/2 weeks End Date: 08/18/2016  [facility] Care Per Protocol:  Labs weekly while on IV antibiotics: _x_ CBC with differential _x_ BMP __ CMP __ CRP __ ESR __ Vancomycin trough  __ Please pull PIC at completion of IV antibiotics _x_ Please leave PIC in place until doctor has seen patient or been notified  Fax weekly labs to [phone]  Clinic Follow Up Appt: 08/18/2016 @ [place], [place] for [place] [phone] pager   [phone] cell 08/03/2016, 1:15 PM

## 2016-08-03 NOTE — Progress Notes (Signed)
[place] pt for [facility] this hospital admission  [facility] will provide [facility] services for home IV ABX upon DC. [facility] hospital team will follow until DC to ensure [facility] needs are met.   If patient discharges after hours, please call [phone].   Larry Sierras 08/03/2016, 3:39 PM

## 2016-08-03 NOTE — Progress Notes (Signed)
Patient reports nausea, chills, and exessive sweating after IV antibiotic (Ancef) administration.

## 2016-08-04 DIAGNOSIS — E1151 Type 2 diabetes mellitus with diabetic peripheral angiopathy without gangrene: Secondary | ICD-10-CM | POA: Diagnosis not present

## 2016-08-04 DIAGNOSIS — K8020 Calculus of gallbladder without cholecystitis without obstruction: Secondary | ICD-10-CM | POA: Diagnosis not present

## 2016-08-04 DIAGNOSIS — L03114 Cellulitis of left upper limb: Secondary | ICD-10-CM | POA: Diagnosis not present

## 2016-08-04 DIAGNOSIS — R7881 Bacteremia: Secondary | ICD-10-CM | POA: Diagnosis not present

## 2016-08-04 DIAGNOSIS — B9561 Methicillin susceptible Staphylococcus aureus infection as the cause of diseases classified elsewhere: Secondary | ICD-10-CM | POA: Diagnosis not present

## 2016-08-04 DIAGNOSIS — K5520 Angiodysplasia of colon without hemorrhage: Secondary | ICD-10-CM | POA: Diagnosis not present

## 2016-08-04 LAB — GLUCOSE, CAPILLARY
GLUCOSE-CAPILLARY: 158 mg/dL — AB (ref 65–99)
Glucose-Capillary: 170 mg/dL — ABNORMAL HIGH (ref 65–99)

## 2016-08-04 MED ORDER — METOPROLOL TARTRATE 75 MG PO TABS: 75.0000 mg | ORAL_TABLET | Freq: Two times a day (BID) | ORAL | 0 refills | Status: DC

## 2016-08-04 MED ORDER — HEPARIN SOD (PORK) LOCK FLUSH 100 UNIT/ML IV SOLN
250.0000 [IU] | INTRAVENOUS | Status: AC | PRN
  Administered 2016-08-04: 250 [IU]

## 2016-08-04 MED ORDER — VANCOMYCIN 50 MG/ML ORAL SOLUTION: 125.0000 mg | Freq: Four times a day (QID) | ORAL | 0 refills | Status: AC

## 2016-08-04 MED ORDER — CEFAZOLIN IV (FOR PTA / DISCHARGE USE ONLY): 2.0000 g | Freq: Three times a day (TID) | INTRAVENOUS | 0 refills | Status: AC

## 2016-08-04 MED ORDER — CLOPIDOGREL BISULFATE 75 MG PO TABS: 75.0000 mg | ORAL_TABLET | Freq: Every day | ORAL | 0 refills | Status: DC

## 2016-08-04 MED ORDER — ASPIRIN 81 MG PO CHEW: 81.0000 mg | CHEWABLE_TABLET | Freq: Every day | ORAL | 0 refills | Status: DC

## 2016-08-04 MED ORDER — ACETAMINOPHEN 325 MG PO TABS: 650.0000 mg | ORAL_TABLET | ORAL | Status: DC | PRN

## 2016-08-04 MED ORDER — CEFAZOLIN SODIUM-DEXTROSE 2-4 GM/100ML-% IV SOLN: 2.0000 g | Freq: Three times a day (TID) | INTRAVENOUS | 0 refills | Status: DC

## 2016-08-04 NOTE — Progress Notes (Signed)
CARDIAC REHAB PHASE I   PRE:  Rate/Rhythm: 75 NT  BP:  Sitting: 135/82        SaO2: 95 RA  MODE:  Ambulation: 260 ft   POST:  Rate/Rhythm: 71 NT  BP:  Sitting: 135/76         SaO2: 100 RA  Pt ambulated 260 ft on RA, IV, hand held assist, steady gait, tolerated fair. Pt c/o increased DOE today, standing rest x2. VSS. Completed MI/stent education. Reviewed heart healthy, diabetes diet, sodium restrictions and portion control. Pt and wife verbalized understanding. Pt has very poor dietary habits. Pt to bed per pt request after walk, call bell within reach.   Gordon, RN, BSN 08/04/2016 12:24 PM

## 2016-08-04 NOTE — Discharge Summary (Signed)
Physician Discharge Summary  EDEM TIEGS PJK:[id number] DOB: Oct 15, 1940 DOA: 07/27/2016  PCP: [name], MD  Admit date: 07/27/2016 Discharge date: 08/04/2016  Time spent: > 35 minutes  Recommendations for Outpatient Follow-up:  Of note cardiology recommending aspirin plus Plavix plus apixaban for 1 month then stopping the aspirin (please stop aspirin at appropriate time interval)   Discharge Diagnoses:  Principal Problem:   Staphylococcus aureus bacteremia Active Problems:   Pancytopenia ([place])   DM (diabetes mellitus), type 2 with peripheral vascular complications (HCC)   Angiodysplasia of intestinal tract   Hypertriglyceridemia   Carotid stenosis   PAF (paroxysmal atrial fibrillation) (HCC)   Hyperlipidemia   Essential hypertension   Cholelithiasis   Hypothyroidism   Hyperkalemia   Chronic diastolic CHF (congestive heart failure) (HCC)   Nonrheumatic aortic valve stenosis   NSTEMI (non-ST elevated myocardial infarction) ([place])   CAD (coronary artery disease)   PAD (peripheral artery disease) ([place])   Septic thrombophlebitis of upper extremity   History of Clostridium difficile colitis   Discharge Condition: stable  Diet recommendation: heart healthy/diabetic diet.  Filed Weights   08/02/16 0510 08/03/16 0528 08/04/16 0500  Weight: 120.3 kg (265 lb 3.4 oz) 122 kg (268 lb 15.4 oz) 123.5 kg (272 lb 4.3 oz)    History of present illness:  76 y.o.malewith medical history significant forinsulin-dependent diabetes mellitus, hypertension, coronary artery disease, chronic kidney disease stage III, paroxysmal atrial fibrillation on Eliquis, and reported small bowel AVMs who presents to the emergency department with chest pain anddyspnea.  Patient ended up having cardiac catheter evaluation. Subsequently developed fevers and was found to have cellulitis of left wrist as well as Staphylococcus aureus bacteremia.    Hospital Course:  Principal Problem:   Staphylococcus  aureus bacteremia - Patient placed on IV antibiotics continue Cefazolin 08/18/16 has ID appointment  Per ID  Labs weekly while on IV antibiotics: _x_ CBC with differential _x_ BMP __ CMP __ CRP __ ESR __ Vancomycin trough  __ Please pull PIC at completion of IV antibiotics _x_ Please leave PIC in place until doctor has seen patient or been notified   Diarrhea - Pt started on oral vanc while C diff test positive but toxin negative. - continue oral vancomycin 1 week past completion of cefazolin. Pt has f/u with ID.  Active Problems:   Pancytopenia ([place])   DM (diabetes mellitus), type 2 with peripheral vascular complications ([place]) - continue prior to admission hypoglycemic agents    Angiodysplasia of intestinal tract   Hypertriglyceridemia   Carotid stenosis    PAF (paroxysmal atrial fibrillation) ([place]) - Patient currently on Cardizem and beta blocker. Also on eliquis    Hyperlipidemia - stable on lipitor    Essential hypertension - continue cardizem and metroprolol if BP remains consistently elevated will plan on increasing or adding another antihypertensive medication.      Cholelithiasis - Stable    Hypothyroidism - We'll continue Synthroid, stable no hypothyroidism symptoms reported    Hyperkalemia - Resolved    Chronic diastolic CHF (congestive heart failure) (HCC)   Nonrheumatic aortic valve stenosis/NSTEMI (non-ST elevated myocardial infarction) (HCC)/ CAD (coronary artery disease)/ - Cardiology on board and managing medication recommendations. Of note cardiology recommending aspirin plus Plavix plus apixaban for 1 month then stopping the aspirin     PAD (peripheral artery disease) (HCC)   Septic thrombophlebitis of upper extremity   History of Clostridium difficile colitis  Procedures:  None  Consultations:  ID  Cardiology  Discharge Exam: Vitals:  08/04/16 1005 08/04/16 1311  BP: (!) 183/80 (!) 150/92  Pulse: 98 86  Resp:  19   Temp:  98.7 F (37.1 C)    General: Pt in nad, alert and awake Cardiovascular: no cyanosis Respiratory: no increased wob, no wheezes  Discharge Instructions   Discharge Instructions    Amb Referral to Cardiac Rehabilitation    Complete by:  As directed    Diagnosis:   Coronary Stents NSTEMI     Call MD for:    Complete by:  As directed    Call MD for:  temperature >100.4    Complete by:  As directed    Diet - low sodium heart healthy    Complete by:  As directed    Discharge instructions    Complete by:  As directed    Please be sure to follow up with you primary care physician in 1-2 weeks or sooner should any new concerns arise.   Home infusion instructions [facility] May follow [place] Dosing Protocol; May administer Cathflo as needed to maintain patency of vascular access device.; Flushing of vascular access device: per [facility] Protocol: 0.9% NaCl pre/post medica...    Complete by:  As directed    Instructions:  May follow [place] Dosing Protocol   Instructions:  May administer Cathflo as needed to maintain patency of vascular access device.   Instructions:  Flushing of vascular access device: per [facility] Protocol: 0.9% NaCl pre/post medication administration and prn patency; Heparin 100 u/ml, 64m for implanted ports and Heparin 10u/ml, 572mfor all other central venous catheters.   Instructions:  May follow [facility] Anaphylaxis Protocol for First Dose Administration in the home: 0.9% NaCl at 25-50 ml/hr to maintain IV access for protocol meds. Epinephrine 0.3 ml IV/IM PRN and Benadryl 25-50 IV/IM PRN s/s of anaphylaxis.   Instructions:  [facility] Coordinator (RN) to assist per patient IV care needs in the home PRN.   Increase activity slowly    Complete by:  As directed      Current Discharge Medication List    START taking these medications   Details  acetaminophen (TYLENOL) 325 MG tablet Take 2 tablets (650 mg total) by mouth every 4 (four)  hours as needed for mild pain, fever or headache.    aspirin 81 MG chewable tablet Chew 1 tablet (81 mg total) by mouth daily. Qty: 30 tablet, Refills: 0    ceFAZolin (ANCEF) 2-4 GM/100ML-% IVPB Inject 100 mLs (2 g total) into the vein every 8 (eight) hours. Qty: 1 each, Refills: 0    ceFAZolin (ANCEF) IVPB Inject 2 g into the vein every 8 (eight) hours. Indication:  Left wrist septic arthritis with MSSA bacteremia Last Day of Therapy:  08/18/16 Labs - Once weekly:  CBC/D and BMP, Labs - Every other week:  ESR and CRP Qty: 45 Units, Refills: 0    clopidogrel (PLAVIX) 75 MG tablet Take 1 tablet (75 mg total) by mouth daily with breakfast. Qty: 30 tablet, Refills: 0    vancomycin (VANCOCIN) 50 mg/mL oral solution Take 2.5 mLs (125 mg total) by mouth every 6 (six) hours. Qty: 210 mL, Refills: 0      CONTINUE these medications which have CHANGED   Details  metoprolol 75 MG TABS Take 75 mg by mouth 2 (two) times daily. Qty: 60 tablet, Refills: 0      CONTINUE these medications which have NOT CHANGED   Details  allopurinol (ZYLOPRIM) 100 MG tablet Take  2 tablets (200 mg total) by mouth daily. Qty: 60 tablet, Refills: 11    amiodarone (PACERONE) 200 MG tablet TAKE 1 TABLET BY MOUTH DAILY Qty: 30 tablet, Refills: 6    apixaban (ELIQUIS) 5 MG TABS tablet Take 1 tablet (5 mg total) by mouth 2 (two) times daily. Qty: 180 tablet, Refills: 3    atorvastatin (LIPITOR) 40 MG tablet Take 1 tablet (40 mg total) by mouth daily. Qty: 90 tablet, Refills: 2    Cholecalciferol 1000 UNITS TBDP Take 1,000-5,000 Units by mouth daily. Takes 1000 units everyday except for on Saturday patient takes 5000 units    diltiazem (CARDIZEM CD) 180 MG 24 hr capsule Take 1 capsule (180 mg total) by mouth daily. Qty: 30 capsule, Refills: 10    fenofibrate 160 MG tablet TAKE 1 TABLET(160 MG) BY MOUTH DAILY Qty: 30 tablet, Refills: 11    ferrous sulfate 325 (65 FE) MG tablet Take 325 mg by mouth 3 (three)  times daily with meals.    glipiZIDE (GLUCOTROL) 10 MG tablet Take 10 mg by mouth daily.    HYDROcodone-acetaminophen (NORCO/VICODIN) 5-325 MG tablet Take 1 tablet by mouth 2 (two) times daily as needed for moderate pain or severe pain. Qty: 100 tablet, Refills: 0    Insulin Glargine (TOUJEO SOLOSTAR) 300 UNIT/ML SOPN Inject 50 Units into the skin daily. Titrate up by 1 unit every 1-2 days for goal sugars of 120-150 Qty: 6 mL, Refills: 11    levothyroxine (SYNTHROID, LEVOTHROID) 150 MCG tablet TAKE 1 TABLET BY MOUTH DAILY BEFORE BREAKFAST Qty: 90 tablet, Refills: 3    meclizine (ANTIVERT) 12.5 MG tablet Take 1 tablet (12.5 mg total) by mouth 2 (two) times daily as needed for dizziness (do not use for more than 3days). Qty: 6 tablet, Refills: 0   Associated Diagnoses: Dizziness of unknown cause    omeprazole (PRILOSEC) 40 MG capsule TAKE 1 CAPSULE BY MOUTH EVERY DAY Qty: 90 capsule, Refills: 3    Blood Glucose Monitoring Suppl (ONE TOUCH ULTRA 2) W/DEVICE KIT Use as directed Dx E11.9 Qty: 1 each, Refills: 0    Blood Pressure Monitor KIT Use to check blood pressure daily Dx I10 Qty: 1 each, Refills: 0    glucose blood (ONE TOUCH ULTRA TEST) test strip 1 each by Other route 4 (four) times daily. Use to check blood sugar four times a day  Dx: E11.9- insulin Dependant Qty: 120 each, Refills: 5    Insulin Pen Needle 31G X 5 MM MISC 1 Units by Does not apply route 4 (four) times daily. Qty: 150 each, Refills: 11    Lancets (ONETOUCH ULTRASOFT) lancets Use to check blood sugars daily Qty: 30 each, Refills: 11      STOP taking these medications     acetaminophen (TYLENOL) 650 MG CR tablet      furosemide (LASIX) 40 MG tablet      HUMIRA PEN 40 MG/0.8ML PNKT      valsartan (DIOVAN) 160 MG tablet        Allergies  Allergen Reactions  . Percocet [Oxycodone-Acetaminophen] Nausea And Vomiting  . Invokana [Canagliflozin]     Side effects  . Metformin And Related     Upset  stomach      The results of significant diagnostics from this hospitalization (including imaging, microbiology, ancillary and laboratory) are listed below for reference.    Significant Diagnostic Studies: Dg Chest 2 View  Result Date: 07/27/2016 CLINICAL DATA:  Generalized chest pressure today.  Recent pneumonia.  EXAM: CHEST  2 VIEW COMPARISON:  Chest radiograph July 07, 2016 FINDINGS: Cardiomediastinal silhouette is normal. Mildly calcified aortic knob. Patchy RIGHT middle lobe airspace opacity. Linear densities project LEFT mid and lower lung zone. No pleural effusion. No pneumothorax. Soft tissue planes included osseous structures are nonsuspicious. IMPRESSION: Similar patchy RIGHT middle lobe and lingular/LEFT lung base atelectasis/scarring. Electronically Signed   By: Elon Alas M.D.   On: 07/27/2016 02:21   Dg Chest 2 View  Result Date: 07/07/2016 CLINICAL DATA:  Follow-up pneumonia EXAM: CHEST  2 VIEW COMPARISON:  06/05/2016 FINDINGS: Improvement in the lingular infiltrate since prior study. Linear densities remain, likely lingular scarring. Heart is normal size. No confluent opacity on the right. No effusions or acute bony abnormality. IMPRESSION: Improved opacities in the lingula with residual linear densities, likely scarring. No active disease. Electronically Signed   By: Rolm Baptise M.D.   On: 07/07/2016 13:31   Dg Wrist Complete Left  Result Date: 07/30/2016 CLINICAL DATA:  Swelling and bulging wound along the central posterior wrist EXAM: LEFT WRIST - COMPLETE 3+ VIEW COMPARISON:  06/06/2008 FINDINGS: No acute fracture or dislocation. Moderate osteoarthritis of the radiocarpal joint. Mild osteoarthritis of the scaphotrapeziotrapezoid joint. Mild osteoarthritis of the first [place] joint. Moderate osteoarthritis of the first [place] joint. Periarticular erosions involving the first metacarpal head as can be seen with gout. Soft tissue swelling along the dorsal aspect the hand which  may represent a small hematoma given the patient's history. Peripheral vascular atherosclerotic disease. IMPRESSION: 1.  No acute osseous injury of the left hand. 2. Periarticular erosions involving the first metacarpal head as can be seen with gout. Electronically Signed   By: Kathreen Devoid   On: 07/30/2016 09:39   Dg Chest Port 1 View  Result Date: 08/03/2016 CLINICAL DATA:  Evaluate central venous catheter placement EXAM: PORTABLE CHEST 1 VIEW COMPARISON:  08/03/2016 FINDINGS: There is a right arm PICC line with tip in the projection of the distal SVC. The heart size is moderately enlarged. No pleural effusion or edema. No airspace opacities. IMPRESSION: 1. Right arm PICC line tip projects over the distal SVC. Electronically Signed   By: Kerby Moors M.D.   On: 08/03/2016 16:42   Dg Chest Port 1 View  Result Date: 08/03/2016 CLINICAL DATA:  PICC line placement. EXAM: PORTABLE CHEST 1 VIEW COMPARISON:  07/30/2016 and 07/27/2016. FINDINGS: 1348 hours. Right arm PICC projects to the mid SVC level. The catheter appears to extend into the azygos vein, suboptimally evaluated on this single AP view. Stable cardiomegaly and aortic atherosclerosis. There is mild atelectasis at both lung bases. No confluent airspace opacity, pneumothorax or significant pleural effusion. IMPRESSION: Right arm PICC appears to extend into the azygos vein on this single portable AP examination. Catheter repositioning prior to use and radiographic follow up recommended. These results will be called to the ordering clinician or representative by the Radiologist Assistant, and communication documented in the PACS or zVision Dashboard. Electronically Signed   By: Richardean Sale M.D.   On: 08/03/2016 14:06   Dg Chest Port 1 View  Result Date: 07/30/2016 CLINICAL DATA:  Increasing shortness of Breath EXAM: PORTABLE CHEST 1 VIEW COMPARISON:  07/27/2016 FINDINGS: Cardiac shadow remains enlarged. The lungs are again well aerated without  focal infiltrate. Very mild vascular congestion is seen without interstitial edema. No bony abnormality is noted. IMPRESSION: Mild vascular prominence without interstitial edema. Electronically Signed   By: Inez Catalina M.D.   On: 07/30/2016 13:58  US Abdomen Limited Ruq  Result Date: 07/27/2016 CLINICAL DATA:  Right upper quadrant pain and chest pain, 4 hours duration. EXAM: US ABDOMEN LIMITED - RIGHT UPPER QUADRANT COMPARISON:  None. FINDINGS: Gallbladder: Multiple mobile calculi, measuring up to 1.2 cm. No gallbladder wall thickening or pericholecystic fluid. The patient was not tender over the gallbladder. Common bile duct: Diameter: 3 mm Liver: Mild coarsening of hepatic echotexture without discrete lesion. IMPRESSION: Cholelithiasis without sonographic evidence of acute cholecystitis. Electronically Signed   By: Andreas Newport M.D.   On: 07/27/2016 03:55    Microbiology: Recent Results (from the past 240 hour(s))  Culture, blood (Routine X 2) w Reflex to ID Panel     Status: Abnormal   Collection Time: 07/30/16  7:51 AM  Result Value Ref Range Status   Specimen Description BLOOD RIGHT HAND  Final   Special Requests IN PEDIATRIC BOTTLE 1CC  Final   Culture  Setup Time   Final    GRAM POSITIVE COCCI IN CLUSTERS AEROBIC BOTTLE ONLY CRITICAL VALUE NOTED.  VALUE IS CONSISTENT WITH PREVIOUSLY REPORTED AND CALLED VALUE.    Culture (A)  Final    STAPHYLOCOCCUS AUREUS SUSCEPTIBILITIES PERFORMED ON PREVIOUS CULTURE WITHIN THE LAST 5 DAYS.    Report Status 08/01/2016 FINAL  Final  Culture, blood (Routine X 2) w Reflex to ID Panel     Status: Abnormal   Collection Time: 07/30/16  7:54 AM  Result Value Ref Range Status   Specimen Description BLOOD RIGHT HAND  Final   Special Requests BOTTLES DRAWN AEROBIC AND ANAEROBIC 5CC EA  Final   Culture  Setup Time   Final    GRAM POSITIVE COCCI IN CLUSTERS IN BOTH AEROBIC AND ANAEROBIC BOTTLES CRITICAL RESULT CALLED TO, READ BACK BY AND VERIFIED  WITH: J MILLEN PHARMD 2206 07/30/16 A BROWNING    Culture STAPHYLOCOCCUS AUREUS (A)  Final   Report Status 08/01/2016 FINAL  Final   Organism ID, Bacteria STAPHYLOCOCCUS AUREUS  Final      Susceptibility   Staphylococcus aureus - MIC*    CIPROFLOXACIN <=0.5 SENSITIVE Sensitive     ERYTHROMYCIN <=0.25 SENSITIVE Sensitive     GENTAMICIN <=0.5 SENSITIVE Sensitive     OXACILLIN 0.5 SENSITIVE Sensitive     TETRACYCLINE <=1 SENSITIVE Sensitive     VANCOMYCIN <=0.5 SENSITIVE Sensitive     TRIMETH/SULFA <=10 SENSITIVE Sensitive     CLINDAMYCIN <=0.25 SENSITIVE Sensitive     RIFAMPIN <=0.5 SENSITIVE Sensitive     Inducible Clindamycin NEGATIVE Sensitive     * STAPHYLOCOCCUS AUREUS  Blood Culture ID Panel (Reflexed)     Status: Abnormal   Collection Time: 07/30/16  7:54 AM  Result Value Ref Range Status   Enterococcus species NOT DETECTED NOT DETECTED Final   Listeria monocytogenes NOT DETECTED NOT DETECTED Final   Staphylococcus species DETECTED (A) NOT DETECTED Final    Comment: CRITICAL RESULT CALLED TO, READ BACK BY AND VERIFIED WITH: J MILLEN PHARMD 2206 07/30/16 A BROWNING    Staphylococcus aureus DETECTED (A) NOT DETECTED Final    Comment: CRITICAL RESULT CALLED TO, READ BACK BY AND VERIFIED WITH: J MILLEN PHARMD 2206 07/30/16 A BROWNING    Methicillin resistance NOT DETECTED NOT DETECTED Final   Streptococcus species NOT DETECTED NOT DETECTED Final   Streptococcus agalactiae NOT DETECTED NOT DETECTED Final   Streptococcus pneumoniae NOT DETECTED NOT DETECTED Final   Streptococcus pyogenes NOT DETECTED NOT DETECTED Final   Acinetobacter baumannii NOT DETECTED NOT DETECTED Final   Enterobacteriaceae  species NOT DETECTED NOT DETECTED Final   Enterobacter cloacae complex NOT DETECTED NOT DETECTED Final   Escherichia coli NOT DETECTED NOT DETECTED Final   Klebsiella oxytoca NOT DETECTED NOT DETECTED Final   Klebsiella pneumoniae NOT DETECTED NOT DETECTED Final   Proteus species NOT  DETECTED NOT DETECTED Final   Serratia marcescens NOT DETECTED NOT DETECTED Final   Haemophilus influenzae NOT DETECTED NOT DETECTED Final   Neisseria meningitidis NOT DETECTED NOT DETECTED Final   Pseudomonas aeruginosa NOT DETECTED NOT DETECTED Final   Candida albicans NOT DETECTED NOT DETECTED Final   Candida glabrata NOT DETECTED NOT DETECTED Final   Candida krusei NOT DETECTED NOT DETECTED Final   Candida parapsilosis NOT DETECTED NOT DETECTED Final   Candida tropicalis NOT DETECTED NOT DETECTED Final  MRSA PCR Screening     Status: None   Collection Time: 07/30/16 10:25 AM  Result Value Ref Range Status   MRSA by PCR NEGATIVE NEGATIVE Final    Comment:        The GeneXpert MRSA Assay (FDA approved for NASAL specimens only), is one component of a comprehensive MRSA colonization surveillance program. It is not intended to diagnose MRSA infection nor to guide or monitor treatment for MRSA infections.   Culture, blood (routine x 2)     Status: None (Preliminary result)   Collection Time: 07/31/16 10:51 AM  Result Value Ref Range Status   Specimen Description BLOOD LEFT ASSIST CONTROL  Final   Special Requests BOTTLES DRAWN AEROBIC ONLY 5CC  Final   Culture NO GROWTH 3 DAYS  Final   Report Status PENDING  Incomplete  Culture, blood (routine x 2)     Status: None (Preliminary result)   Collection Time: 07/31/16 10:51 AM  Result Value Ref Range Status   Specimen Description BLOOD RIGHT ASSIST CONTROL  Final   Special Requests BOTTLES DRAWN AEROBIC ONLY 5CC  Final   Culture NO GROWTH 3 DAYS  Final   Report Status PENDING  Incomplete  C difficile quick scan w PCR reflex     Status: Abnormal   Collection Time: 07/31/16 11:49 AM  Result Value Ref Range Status   C Diff antigen POSITIVE (A) NEGATIVE Final   C Diff toxin NEGATIVE NEGATIVE Final   C Diff interpretation Results are indeterminate. See PCR results.  Final  Clostridium Difficile by PCR     Status: Abnormal    Collection Time: 07/31/16 11:49 AM  Result Value Ref Range Status   Toxigenic C Difficile by pcr POSITIVE (A) NEGATIVE Final    Comment: Positive for toxigenic C. difficile with little to no toxin production. Only treat if clinical presentation suggests symptomatic illness.     Labs: Basic Metabolic Panel:  Recent Labs Lab 07/29/16 0122 07/30/16 0346 07/31/16 0455 08/03/16 0420  NA 135 134* 131* 135  K 4.0 4.0 3.6 3.6  CL 102 103 102 103  CO2 25 18* 21* 24  GLUCOSE 225* 250* 204* 172*  BUN 22* 29* 32* 23*  CREATININE 1.56* 2.03* 2.02* 1.51*  CALCIUM 8.9 8.4* 7.5* 7.6*   Liver Function Tests:  Recent Labs Lab 07/30/16 0751  AST 40  ALT 24  ALKPHOS 36*  BILITOT 1.3*  PROT 6.7  ALBUMIN 3.5   No results for input(s): LIPASE, AMYLASE in the last 168 hours. No results for input(s): AMMONIA in the last 168 hours. CBC:  Recent Labs Lab 07/29/16 0122 07/30/16 0346  WBC 6.0 8.5  HGB 12.3* 10.8*  HCT 34.9* 32.8*  MCV 85.5 88.6  PLT 141* 100*   Cardiac Enzymes:  Recent Labs Lab 07/28/16 1819 07/29/16 0122  TROPONINI 1.58* 1.78*   BNP: BNP (last 3 results)  Recent Labs  07/27/16 0957  BNP 64.5    ProBNP (last 3 results) No results for input(s): PROBNP in the last 8760 hours.  CBG:  Recent Labs Lab 08/03/16 1322 08/03/16 1723 08/03/16 2144 08/04/16 0721 08/04/16 1148  GLUCAP 180* 173* 146* 158* 170*    Signed:  Velvet Bathe MD.  [facility] 08/04/2016, 3:21 PM

## 2016-08-05 ENCOUNTER — Telehealth: Payer: Self-pay | Admitting: *Deleted

## 2016-08-05 DIAGNOSIS — E1151 Type 2 diabetes mellitus with diabetic peripheral angiopathy without gangrene: Secondary | ICD-10-CM | POA: Diagnosis not present

## 2016-08-05 DIAGNOSIS — K8020 Calculus of gallbladder without cholecystitis without obstruction: Secondary | ICD-10-CM | POA: Diagnosis not present

## 2016-08-05 DIAGNOSIS — L03114 Cellulitis of left upper limb: Secondary | ICD-10-CM | POA: Diagnosis not present

## 2016-08-05 DIAGNOSIS — R7881 Bacteremia: Secondary | ICD-10-CM | POA: Diagnosis not present

## 2016-08-05 DIAGNOSIS — K5520 Angiodysplasia of colon without hemorrhage: Secondary | ICD-10-CM | POA: Diagnosis not present

## 2016-08-05 DIAGNOSIS — B9561 Methicillin susceptible Staphylococcus aureus infection as the cause of diseases classified elsewhere: Secondary | ICD-10-CM | POA: Diagnosis not present

## 2016-08-05 LAB — CULTURE, BLOOD (ROUTINE X 2)
Culture: NO GROWTH
Culture: NO GROWTH

## 2016-08-05 NOTE — Telephone Encounter (Signed)
Transition Care Management Follow-up Telephone Call   Date discharged? 08/04/16   How have you been since you were released from the hospital? Called pt spoke w/wife she states mr Ficek is doing alright   Do you understand why you were in the hospital? YES   Do you understand the discharge instructions? YES   Where were you discharged to? Home   Items Reviewed:  Medications reviewed: YES  Allergies reviewed: YES  Dietary changes reviewed: YES, heart healthy & diabetic diet  Referrals reviewed: No referral needed   Functional Questionnaire:   Activities of Daily Living (ADLs):   She states he are independent in the following: ambulation, bathing and hygiene, feeding, continence, grooming, toileting and dressing States he require assistance with the following: ambulation sometimes,still a little weak   Any transportation issues/concerns?: NO   Any patient concerns? NO   Confirmed importance and date/time of follow-up visits scheduled YES, appt [phone]  Provider Appointment booked with Dr. Alain Marion  Confirmed with patient if condition begins to worsen call PCP or go to the ER.  Patient was given the office number and encouraged to call back with question or concerns.  : YES

## 2016-08-07 ENCOUNTER — Other Ambulatory Visit: Payer: Self-pay | Admitting: *Deleted

## 2016-08-07 MED ORDER — FENOFIBRATE 160 MG PO TABS: ORAL_TABLET | ORAL | 3 refills | Status: DC

## 2016-08-10 ENCOUNTER — Inpatient Hospital Stay (HOSPITAL_COMMUNITY)
Admission: EM | Admit: 2016-08-10 | Discharge: 2016-08-16 | DRG: 280 | Disposition: A | Discharge status: Home / Self Care | Payer: Medicare Other | Attending: Internal Medicine | Admitting: Internal Medicine

## 2016-08-10 ENCOUNTER — Encounter (HOSPITAL_COMMUNITY): Payer: Self-pay | Admitting: Family Medicine

## 2016-08-10 ENCOUNTER — Emergency Department (HOSPITAL_COMMUNITY): Payer: Medicare Other

## 2016-08-10 ENCOUNTER — Telehealth: Payer: Self-pay | Admitting: Cardiovascular Disease

## 2016-08-10 DIAGNOSIS — M109 Gout, unspecified: Secondary | ICD-10-CM | POA: Diagnosis present

## 2016-08-10 DIAGNOSIS — L03114 Cellulitis of left upper limb: Secondary | ICD-10-CM | POA: Diagnosis not present

## 2016-08-10 DIAGNOSIS — I5033 Acute on chronic diastolic (congestive) heart failure: Secondary | ICD-10-CM

## 2016-08-10 DIAGNOSIS — K219 Gastro-esophageal reflux disease without esophagitis: Secondary | ICD-10-CM | POA: Diagnosis present

## 2016-08-10 DIAGNOSIS — Z7901 Long term (current) use of anticoagulants: Secondary | ICD-10-CM | POA: Diagnosis not present

## 2016-08-10 DIAGNOSIS — I480 Paroxysmal atrial fibrillation: Secondary | ICD-10-CM | POA: Diagnosis present

## 2016-08-10 DIAGNOSIS — I110 Hypertensive heart disease with heart failure: Secondary | ICD-10-CM | POA: Diagnosis not present

## 2016-08-10 DIAGNOSIS — Z794 Long term (current) use of insulin: Secondary | ICD-10-CM | POA: Diagnosis not present

## 2016-08-10 DIAGNOSIS — Z825 Family history of asthma and other chronic lower respiratory diseases: Secondary | ICD-10-CM | POA: Diagnosis not present

## 2016-08-10 DIAGNOSIS — A0472 Enterocolitis due to Clostridium difficile, not specified as recurrent: Secondary | ICD-10-CM | POA: Diagnosis present

## 2016-08-10 DIAGNOSIS — N183 Chronic kidney disease, stage 3 (moderate): Secondary | ICD-10-CM | POA: Diagnosis not present

## 2016-08-10 DIAGNOSIS — D500 Iron deficiency anemia secondary to blood loss (chronic): Secondary | ICD-10-CM | POA: Diagnosis not present

## 2016-08-10 DIAGNOSIS — I130 Hypertensive heart and chronic kidney disease with heart failure and stage 1 through stage 4 chronic kidney disease, or unspecified chronic kidney disease: Principal | ICD-10-CM | POA: Diagnosis present

## 2016-08-10 DIAGNOSIS — I2510 Atherosclerotic heart disease of native coronary artery without angina pectoris: Secondary | ICD-10-CM | POA: Diagnosis not present

## 2016-08-10 DIAGNOSIS — I481 Persistent atrial fibrillation: Secondary | ICD-10-CM | POA: Diagnosis not present

## 2016-08-10 DIAGNOSIS — E1151 Type 2 diabetes mellitus with diabetic peripheral angiopathy without gangrene: Secondary | ICD-10-CM | POA: Diagnosis present

## 2016-08-10 DIAGNOSIS — E039 Hypothyroidism, unspecified: Secondary | ICD-10-CM | POA: Diagnosis present

## 2016-08-10 DIAGNOSIS — D649 Anemia, unspecified: Secondary | ICD-10-CM | POA: Diagnosis not present

## 2016-08-10 DIAGNOSIS — E785 Hyperlipidemia, unspecified: Secondary | ICD-10-CM | POA: Diagnosis present

## 2016-08-10 DIAGNOSIS — Z955 Presence of coronary angioplasty implant and graft: Secondary | ICD-10-CM

## 2016-08-10 DIAGNOSIS — Z87891 Personal history of nicotine dependence: Secondary | ICD-10-CM | POA: Diagnosis not present

## 2016-08-10 DIAGNOSIS — B9561 Methicillin susceptible Staphylococcus aureus infection as the cause of diseases classified elsewhere: Secondary | ICD-10-CM | POA: Diagnosis not present

## 2016-08-10 DIAGNOSIS — Z7982 Long term (current) use of aspirin: Secondary | ICD-10-CM

## 2016-08-10 DIAGNOSIS — L4050 Arthropathic psoriasis, unspecified: Secondary | ICD-10-CM | POA: Diagnosis present

## 2016-08-10 DIAGNOSIS — I10 Essential (primary) hypertension: Secondary | ICD-10-CM | POA: Diagnosis not present

## 2016-08-10 DIAGNOSIS — Z7902 Long term (current) use of antithrombotics/antiplatelets: Secondary | ICD-10-CM

## 2016-08-10 DIAGNOSIS — I25110 Atherosclerotic heart disease of native coronary artery with unstable angina pectoris: Secondary | ICD-10-CM | POA: Diagnosis not present

## 2016-08-10 DIAGNOSIS — I4819 Other persistent atrial fibrillation: Secondary | ICD-10-CM | POA: Diagnosis present

## 2016-08-10 DIAGNOSIS — Z823 Family history of stroke: Secondary | ICD-10-CM | POA: Diagnosis not present

## 2016-08-10 DIAGNOSIS — K8020 Calculus of gallbladder without cholecystitis without obstruction: Secondary | ICD-10-CM | POA: Diagnosis not present

## 2016-08-10 DIAGNOSIS — K558 Other vascular disorders of intestine: Secondary | ICD-10-CM | POA: Diagnosis not present

## 2016-08-10 DIAGNOSIS — I509 Heart failure, unspecified: Secondary | ICD-10-CM

## 2016-08-10 DIAGNOSIS — R7881 Bacteremia: Secondary | ICD-10-CM | POA: Diagnosis not present

## 2016-08-10 DIAGNOSIS — E1122 Type 2 diabetes mellitus with diabetic chronic kidney disease: Secondary | ICD-10-CM | POA: Diagnosis present

## 2016-08-10 DIAGNOSIS — I214 Non-ST elevation (NSTEMI) myocardial infarction: Secondary | ICD-10-CM | POA: Diagnosis present

## 2016-08-10 DIAGNOSIS — Z8249 Family history of ischemic heart disease and other diseases of the circulatory system: Secondary | ICD-10-CM

## 2016-08-10 DIAGNOSIS — Z833 Family history of diabetes mellitus: Secondary | ICD-10-CM | POA: Diagnosis not present

## 2016-08-10 DIAGNOSIS — K5520 Angiodysplasia of colon without hemorrhage: Secondary | ICD-10-CM | POA: Diagnosis present

## 2016-08-10 DIAGNOSIS — Z801 Family history of malignant neoplasm of trachea, bronchus and lung: Secondary | ICD-10-CM | POA: Diagnosis not present

## 2016-08-10 DIAGNOSIS — I252 Old myocardial infarction: Secondary | ICD-10-CM

## 2016-08-10 DIAGNOSIS — I5030 Unspecified diastolic (congestive) heart failure: Secondary | ICD-10-CM | POA: Diagnosis not present

## 2016-08-10 DIAGNOSIS — M1000 Idiopathic gout, unspecified site: Secondary | ICD-10-CM

## 2016-08-10 DIAGNOSIS — Z8619 Personal history of other infectious and parasitic diseases: Secondary | ICD-10-CM

## 2016-08-10 DIAGNOSIS — Z79899 Other long term (current) drug therapy: Secondary | ICD-10-CM

## 2016-08-10 DIAGNOSIS — L03116 Cellulitis of left lower limb: Secondary | ICD-10-CM | POA: Diagnosis not present

## 2016-08-10 DIAGNOSIS — R0602 Shortness of breath: Secondary | ICD-10-CM | POA: Diagnosis not present

## 2016-08-10 DIAGNOSIS — Z86010 Personal history of colonic polyps: Secondary | ICD-10-CM

## 2016-08-10 HISTORY — DX: Unspecified staphylococcus as the cause of diseases classified elsewhere: B95.8

## 2016-08-10 HISTORY — DX: Dyspnea, unspecified: R06.00

## 2016-08-10 HISTORY — DX: Cardiac murmur, unspecified: R01.1

## 2016-08-10 HISTORY — DX: Heart failure, unspecified: I50.9

## 2016-08-10 HISTORY — DX: Enterocolitis due to Clostridium difficile, not specified as recurrent: A04.72

## 2016-08-10 LAB — COMPREHENSIVE METABOLIC PANEL
ALBUMIN: 2.7 g/dL — AB (ref 3.5–5.0)
ALK PHOS: 67 U/L (ref 38–126)
ALT: 9 U/L — ABNORMAL LOW (ref 17–63)
AST: 23 U/L (ref 15–41)
Anion gap: 8 (ref 5–15)
BUN: 19 mg/dL (ref 6–20)
CO2: 26 mmol/L (ref 22–32)
Calcium: 8.4 mg/dL — ABNORMAL LOW (ref 8.9–10.3)
Chloride: 104 mmol/L (ref 101–111)
Creatinine, Ser: 1.49 mg/dL — ABNORMAL HIGH (ref 0.61–1.24)
GFR calc Af Amer: 52 mL/min — ABNORMAL LOW (ref >60)
GFR calc non Af Amer: 45 mL/min — ABNORMAL LOW (ref >60)
GLUCOSE: 124 mg/dL — AB (ref 65–99)
POTASSIUM: 4.5 mmol/L (ref 3.5–5.1)
SODIUM: 138 mmol/L (ref 135–145)
TOTAL PROTEIN: 6.6 g/dL (ref 6.5–8.1)
Total Bilirubin: 0.6 mg/dL (ref 0.3–1.2)

## 2016-08-10 LAB — CBC WITH DIFFERENTIAL/PLATELET
BASOS ABS: 0 10*3/uL (ref 0.0–0.1)
BASOS PCT: 0 %
Eosinophils Absolute: 0.1 10*3/uL (ref 0.0–0.7)
Eosinophils Relative: 2 %
HEMATOCRIT: 27.1 % — AB (ref 39.0–52.0)
HEMOGLOBIN: 8.5 g/dL — AB (ref 13.0–17.0)
Lymphocytes Relative: 11 %
Lymphs Abs: 0.7 10*3/uL (ref 0.7–4.0)
MCH: 28.7 pg (ref 26.0–34.0)
MCHC: 31.4 g/dL (ref 30.0–36.0)
MCV: 91.6 fL (ref 78.0–100.0)
Monocytes Absolute: 0.3 10*3/uL (ref 0.1–1.0)
Monocytes Relative: 4 %
NEUTROS ABS: 5.4 10*3/uL (ref 1.7–7.7)
NEUTROS PCT: 83 %
Platelets: 191 10*3/uL (ref 150–400)
RBC: 2.96 MIL/uL — ABNORMAL LOW (ref 4.22–5.81)
RDW: 18.3 % — AB (ref 11.5–15.5)
WBC: 6.5 10*3/uL (ref 4.0–10.5)

## 2016-08-10 LAB — CBG MONITORING, ED: Glucose-Capillary: 100 mg/dL — ABNORMAL HIGH (ref 65–99)

## 2016-08-10 LAB — C-REACTIVE PROTEIN: CRP: 1.3 mg/dL — ABNORMAL HIGH (ref <1.0)

## 2016-08-10 LAB — I-STAT TROPONIN, ED: TROPONIN I, POC: 0.04 ng/mL (ref 0.00–0.08)

## 2016-08-10 LAB — SEDIMENTATION RATE: Sed Rate: 59 mm/hr — ABNORMAL HIGH (ref 0–16)

## 2016-08-10 LAB — POC OCCULT BLOOD, ED: FECAL OCCULT BLD: POSITIVE — AB

## 2016-08-10 LAB — I-STAT CG4 LACTIC ACID, ED: LACTIC ACID, VENOUS: 1.31 mmol/L (ref 0.5–1.9)

## 2016-08-10 LAB — BRAIN NATRIURETIC PEPTIDE: B Natriuretic Peptide: 468.1 pg/mL — ABNORMAL HIGH (ref 0.0–100.0)

## 2016-08-10 MED ORDER — LEVOTHYROXINE SODIUM 75 MCG PO TABS
150.0000 ug | ORAL_TABLET | Freq: Every day | ORAL | Status: DC
  Administered 2016-08-11 – 2016-08-16 (×5): 150 ug via ORAL
  Filled 2016-08-10 (×3): qty 2
  Filled 2016-08-10: qty 1
  Filled 2016-08-10: qty 2
  Filled 2016-08-10 (×2): qty 1
  Filled 2016-08-10 (×2): qty 2
  Filled 2016-08-10: qty 1

## 2016-08-10 MED ORDER — VANCOMYCIN 50 MG/ML ORAL SOLUTION
125.0000 mg | Freq: Four times a day (QID) | ORAL | Status: DC
  Administered 2016-08-11 – 2016-08-16 (×21): 125 mg via ORAL
  Filled 2016-08-10 (×24): qty 2.5

## 2016-08-10 MED ORDER — APIXABAN 5 MG PO TABS
5.0000 mg | ORAL_TABLET | Freq: Two times a day (BID) | ORAL | Status: DC
  Administered 2016-08-10: 5 mg via ORAL
  Filled 2016-08-10: qty 1

## 2016-08-10 MED ORDER — INSULIN GLARGINE 100 UNIT/ML ~~LOC~~ SOLN
52.0000 [IU] | Freq: Every day | SUBCUTANEOUS | Status: DC
  Administered 2016-08-11 – 2016-08-16 (×5): 52 [IU] via SUBCUTANEOUS
  Filled 2016-08-10 (×6): qty 0.52

## 2016-08-10 MED ORDER — AMIODARONE HCL 200 MG PO TABS
200.0000 mg | ORAL_TABLET | Freq: Every day | ORAL | Status: DC
  Administered 2016-08-11 – 2016-08-16 (×5): 200 mg via ORAL
  Filled 2016-08-10 (×6): qty 1

## 2016-08-10 MED ORDER — FENOFIBRATE 160 MG PO TABS
160.0000 mg | ORAL_TABLET | Freq: Every day | ORAL | Status: DC
  Administered 2016-08-11 – 2016-08-16 (×5): 160 mg via ORAL
  Filled 2016-08-10 (×6): qty 1

## 2016-08-10 MED ORDER — PANTOPRAZOLE SODIUM 40 MG PO TBEC
40.0000 mg | DELAYED_RELEASE_TABLET | Freq: Every day | ORAL | Status: DC
  Administered 2016-08-11 – 2016-08-16 (×5): 40 mg via ORAL
  Filled 2016-08-10 (×6): qty 1

## 2016-08-10 MED ORDER — ASPIRIN 81 MG PO CHEW
81.0000 mg | CHEWABLE_TABLET | Freq: Every day | ORAL | Status: DC
  Administered 2016-08-11 – 2016-08-16 (×5): 81 mg via ORAL
  Filled 2016-08-10 (×6): qty 1

## 2016-08-10 MED ORDER — SODIUM CHLORIDE 0.9% FLUSH
10.0000 mL | INTRAVENOUS | Status: DC | PRN
  Administered 2016-08-11 – 2016-08-12 (×3): 10 mL
  Filled 2016-08-10 (×3): qty 40

## 2016-08-10 MED ORDER — ALBUTEROL SULFATE (2.5 MG/3ML) 0.083% IN NEBU
5.0000 mg | INHALATION_SOLUTION | Freq: Once | RESPIRATORY_TRACT | Status: AC
  Administered 2016-08-10: 5 mg via RESPIRATORY_TRACT
  Filled 2016-08-10: qty 6

## 2016-08-10 MED ORDER — DILTIAZEM HCL ER COATED BEADS 180 MG PO CP24
180.0000 mg | ORAL_CAPSULE | Freq: Every day | ORAL | Status: DC
  Administered 2016-08-11 – 2016-08-16 (×5): 180 mg via ORAL
  Filled 2016-08-10 (×6): qty 1

## 2016-08-10 MED ORDER — POTASSIUM CHLORIDE CRYS ER 20 MEQ PO TBCR
20.0000 meq | EXTENDED_RELEASE_TABLET | Freq: Every day | ORAL | Status: DC
  Administered 2016-08-11 – 2016-08-16 (×5): 20 meq via ORAL
  Filled 2016-08-10 (×6): qty 1

## 2016-08-10 MED ORDER — ONDANSETRON HCL 4 MG/2ML IJ SOLN: 4.0000 mg | Freq: Four times a day (QID) | INTRAMUSCULAR | Status: DC | PRN

## 2016-08-10 MED ORDER — ATORVASTATIN CALCIUM 40 MG PO TABS
40.0000 mg | ORAL_TABLET | Freq: Every day | ORAL | Status: DC
  Administered 2016-08-11 – 2016-08-15 (×4): 40 mg via ORAL
  Filled 2016-08-10 (×5): qty 1

## 2016-08-10 MED ORDER — IRBESARTAN 150 MG PO TABS
150.0000 mg | ORAL_TABLET | Freq: Every day | ORAL | Status: DC
  Administered 2016-08-11 – 2016-08-12 (×2): 150 mg via ORAL
  Filled 2016-08-10 (×2): qty 1

## 2016-08-10 MED ORDER — HYDROCODONE-ACETAMINOPHEN 5-325 MG PO TABS
1.0000 | ORAL_TABLET | ORAL | Status: DC | PRN
  Administered 2016-08-10 – 2016-08-14 (×5): 1 via ORAL
  Filled 2016-08-10 (×5): qty 1

## 2016-08-10 MED ORDER — ONDANSETRON HCL 4 MG PO TABS: 4.0000 mg | ORAL_TABLET | Freq: Four times a day (QID) | ORAL | Status: DC | PRN

## 2016-08-10 MED ORDER — FERROUS SULFATE 325 (65 FE) MG PO TABS
325.0000 mg | ORAL_TABLET | Freq: Three times a day (TID) | ORAL | Status: DC
  Administered 2016-08-11 – 2016-08-16 (×14): 325 mg via ORAL
  Filled 2016-08-10 (×16): qty 1

## 2016-08-10 MED ORDER — METOPROLOL TARTRATE 75 MG PO TABS: 75.0000 mg | ORAL_TABLET | Freq: Two times a day (BID) | ORAL | Status: DC

## 2016-08-10 MED ORDER — METOPROLOL TARTRATE 50 MG PO TABS
75.0000 mg | ORAL_TABLET | Freq: Two times a day (BID) | ORAL | Status: DC
  Administered 2016-08-10 – 2016-08-16 (×11): 75 mg via ORAL
  Filled 2016-08-10 (×11): qty 1
  Filled 2016-08-10: qty 3

## 2016-08-10 MED ORDER — FUROSEMIDE 10 MG/ML IJ SOLN
40.0000 mg | Freq: Once | INTRAMUSCULAR | Status: AC
  Administered 2016-08-10: 40 mg via INTRAVENOUS
  Filled 2016-08-10: qty 4

## 2016-08-10 MED ORDER — INSULIN ASPART 100 UNIT/ML ~~LOC~~ SOLN
0.0000 [IU] | Freq: Three times a day (TID) | SUBCUTANEOUS | Status: DC
  Administered 2016-08-11: 3 [IU] via SUBCUTANEOUS
  Administered 2016-08-11: 2 [IU] via SUBCUTANEOUS
  Administered 2016-08-12: 3 [IU] via SUBCUTANEOUS
  Administered 2016-08-12: 2 [IU] via SUBCUTANEOUS
  Administered 2016-08-12: 3 [IU] via SUBCUTANEOUS
  Administered 2016-08-13 – 2016-08-15 (×3): 2 [IU] via SUBCUTANEOUS
  Administered 2016-08-15 (×2): 3 [IU] via SUBCUTANEOUS
  Administered 2016-08-16: 5 [IU] via SUBCUTANEOUS
  Administered 2016-08-16: 2 [IU] via SUBCUTANEOUS

## 2016-08-10 MED ORDER — CLOPIDOGREL BISULFATE 75 MG PO TABS
75.0000 mg | ORAL_TABLET | Freq: Every day | ORAL | Status: DC
  Administered 2016-08-11 – 2016-08-16 (×5): 75 mg via ORAL
  Filled 2016-08-10 (×7): qty 1

## 2016-08-10 MED ORDER — CEFAZOLIN IV (FOR PTA / DISCHARGE USE ONLY): 2.0000 g | Freq: Three times a day (TID) | INTRAVENOUS | Status: DC

## 2016-08-10 MED ORDER — ALLOPURINOL 100 MG PO TABS
200.0000 mg | ORAL_TABLET | Freq: Every day | ORAL | Status: DC
  Administered 2016-08-11 – 2016-08-16 (×5): 200 mg via ORAL
  Filled 2016-08-10 (×6): qty 2

## 2016-08-10 MED ORDER — FUROSEMIDE 10 MG/ML IJ SOLN
40.0000 mg | Freq: Two times a day (BID) | INTRAMUSCULAR | Status: DC
  Administered 2016-08-11 – 2016-08-15 (×7): 40 mg via INTRAVENOUS
  Filled 2016-08-10 (×8): qty 4

## 2016-08-10 MED ORDER — INSULIN ASPART 100 UNIT/ML ~~LOC~~ SOLN: 0.0000 [IU] | Freq: Every day | SUBCUTANEOUS | Status: DC

## 2016-08-10 MED ORDER — CEFAZOLIN IN D5W 1 GM/50ML IV SOLN
1.0000 g | Freq: Three times a day (TID) | INTRAVENOUS | Status: DC
  Administered 2016-08-11: 1 g via INTRAVENOUS
  Filled 2016-08-10 (×3): qty 50

## 2016-08-10 NOTE — ED Provider Notes (Signed)
[facility] DEPT Provider Note   CSN: [id number] Arrival date & time: 08/10/16  9470     History   Chief Complaint Chief Complaint  Patient presents with  . Shortness of Breath  . Wound Infection    HPI Michael [name] is a 76 y.o. male with past medical history of diabetes mellitus, CAD, CKD, hypertension, paroxysmal atrial fibrillation, adenomatous colonic polyps, iron deficiency anemia, recent cardiac stent placement on 07/29/2016, and recently seen 07/27/2016 for symptomatic anemia presenting with worsening shortness of breath intermittently and weakness for the last 4 days. Patient reports associated dry nonproductive cough. Patient denies hematemesis, history of asthma, history of COPD. Patient admits to about 15-pack-years smoker but has not smoked in the last 10 years. He also reports associated chills intermittently, new lower leg swelling, and headache. He states his headache comes and goes, localized to his forehead when it comes, does not radiate, and is usually relieved by Tylenol. He denies any visual disturbance, changes in gait, neck pain, neck stiffness, or any focal neurological deficits. Patient reports currently being treated for cellulitis of the dorsum of left wrist. Patient is given antibiotics 3 times a day through PICC line right upper arm. Patient has home health nursing come in every day to monitor wound and being treated by cardiologist for it. Patient reports cellulitis on left wrist improving significantly but still has infection there with redness and pain. He states his pain is localized to the area and currently a 6/10. Patient also reports having C. Difficile and currently being treated for it at home. Patient also reports increase in weight about 17 pounds since last visit, which he attributes to fluid retention. Patient denies chest pain, vomiting, urinary symptoms, changes in bowel movements, fevers, numbness. Home health nurse noticed patient more pale and  worsening shortness of breath and called his doctor who recommended to come to the ED.   HPI  Past Medical History:  Diagnosis Date  . Allergy   . Arthritis   . AVM (arteriovenous malformation) of colon   . CAD (coronary artery disease)    a. Cath 10/2014 - Mild to moderate diagonal, circumflex and obtuse marginal disease. Innominate artery sternosis.  . Carotid artery disease ([place])    a. Carotid dopple 03/2014 96-28% RICA &  36-62% LICA stenosis b. 9/47  . CKD (chronic kidney disease), stage III   . Clostridium difficile colitis 05/13/2016  . Dysrhythmia    ATRIAL FIBRILATION  . Elevated troponin    a. 09/2014 in setting of AF RVR-->Myoview: EF 49%, no ischemia/infarct->Med Rx.  . GERD (gastroesophageal reflux disease)   . Gout   . H/O transfusion of whole blood   . History of PFTs    PFTs 6/16:  FVC 3.73 (87%), FEV1 2.93 (88%), FEV1/FVC 78%, DLCO 69%  . Hx of adenomatous colonic polyps 07/02/2016  . Hyperlipidemia   . Hypertension   . Hypothyroidism   . Iron deficiency anemia   . PAF (paroxysmal atrial fibrillation) ([place])    a. 09/2014: Converted on Dilt;  b. CHA2DS2VASc = 3-->eliquis;  c. 09/2014 Echo: EF 55-60%, mild LVH, mildly dil LA.  [name] Personal history of colonic polyps 2007, 2008   adenoma each time, largest 12 mm in 2007  . Psoriasis   . Type II diabetes mellitus ([place])    TYPE 2    Patient Active Problem List   Diagnosis Date Noted  . Staphylococcus aureus bacteremia 07/31/2016  . CAD (coronary artery disease) 07/31/2016  . PAD (peripheral  artery disease) ([place]) 07/31/2016  . Septic thrombophlebitis of upper extremity 07/31/2016  . History of Clostridium difficile colitis 07/31/2016  . NSTEMI (non-ST elevated myocardial infarction) ([place])   . Nonrheumatic aortic valve stenosis   . Upper GI bleed 07/27/2016  . Hyperkalemia 07/27/2016  . Chronic diastolic CHF (congestive heart failure) ([place]) 07/27/2016  . RUQ abdominal pain   . Hx of adenomatous colonic polyps  07/02/2016  . Shortness of breath 06/05/2016  . Hypothyroidism 03/17/2016  . Sepsis ([place]) 03/17/2016  . Noncompliance with diet and medication regimen 12/16/2015  . Frequency-urgency syndrome 03/08/2015  . Subclavian artery stenosis, right ([place]) 01/16/2015  . Cholelithiasis 01/16/2015  . Restless leg syndrome 01/10/2015  . Postherpetic neuralgia 12/18/2014  . PAF (paroxysmal atrial fibrillation) ([place])   . Hyperlipidemia   . Essential hypertension   . Poorly controlled diabetes mellitus ([place]) 10/11/2014  . Psoriasis 03/19/2014  . Unspecified vitamin D deficiency 11/13/2013  . Carotid stenosis 11/06/2013  . Psoriatic arthritis ([place]) 11/06/2013  . Hypertriglyceridemia 05/16/2013  . Morbid obesity due to excess calories ([place]) 09/10/2012  . Gout 09/10/2012  . Angiodysplasia of intestinal tract 01/20/2012  . Iron deficiency anemia due to chronic blood loss 01/20/2012  . Pancytopenia ([place]) 12/22/2011  . DM (diabetes mellitus), type 2 with peripheral vascular complications ([place]) 25/63/8937    Past Surgical History:  Procedure Laterality Date  . APPENDECTOMY  02/09/2014  . CARDIAC CATHETERIZATION N/A 07/29/2016   Procedure: Right/Left Heart Cath and Coronary Angiography;  Surgeon: Nelva Bush, MD;  Location: [facility];  Service: Cardiovascular;  Laterality: N/A;  . CARDIAC CATHETERIZATION N/A 07/29/2016   Procedure: Coronary Stent Intervention;  Surgeon: Nelva Bush, MD;  Location: [facility];  Service: Cardiovascular;  Laterality: N/A;  . COLONOSCOPY  02/10/2011   internal hemorrhoids  . COLONOSCOPY W/ POLYPECTOMY  04/09/2006   12 mm adenoma  . COLONOSCOPY W/ POLYPECTOMY  06/17/2007   5 mm adenoma  . COLONOSCOPY WITH PROPOFOL N/A 05/13/2016   Procedure: COLONOSCOPY WITH PROPOFOL;  Surgeon: Manus Gunning, MD;  Location: [facility];  Service: Gastroenterology;  Laterality: N/A;  . ESOPHAGOGASTRODUODENOSCOPY  12/23/2011   Procedure:  ESOPHAGOGASTRODUODENOSCOPY (EGD);  Surgeon: Irene Shipper, MD;  Location: [facility];  Service: Endoscopy;  Laterality: N/A;  with small bowel bx's  . GIVENS CAPSULE STUDY  12/28/2011  . KNEE ARTHROSCOPY Right   . LAPAROSCOPIC APPENDECTOMY N/A 02/09/2014   Procedure: APPENDECTOMY LAPAROSCOPIC;  Surgeon: Leighton Ruff, MD;  Location: [facility];  Service: General;  Laterality: N/A;  . LEFT HEART CATHETERIZATION WITH CORONARY ANGIOGRAM N/A 11/15/2014   Procedure: LEFT HEART CATHETERIZATION WITH CORONARY ANGIOGRAM;  Surgeon: Belva Crome, MD;  Location: [facility] CATH LAB;  Service: Cardiovascular;  Laterality: N/A;  . ROTATOR CUFF REPAIR     left       Home Medications    Prior to Admission medications   Medication Sig Start Date End Date Taking? Authorizing Provider  acetaminophen (TYLENOL) 325 MG tablet Take 2 tablets (650 mg total) by mouth every 4 (four) hours as needed for mild pain, fever or headache. 08/04/16   Velvet Bathe, MD  allopurinol (ZYLOPRIM) 100 MG tablet Take 2 tablets (200 mg total) by mouth daily. 02/03/16   Evie Lacks [name], MD  amiodarone (PACERONE) 200 MG tablet TAKE 1 TABLET BY MOUTH DAILY 06/24/16   Wellington Hampshire, MD  apixaban (ELIQUIS) 5 MG TABS tablet Take 1 tablet (5 mg total) by mouth 2 (two) times daily. 06/02/16  Wellington Hampshire, MD  aspirin 81 MG chewable tablet Chew 1 tablet (81 mg total) by mouth daily. 08/05/16   Velvet Bathe, MD  atorvastatin (LIPITOR) 40 MG tablet Take 1 tablet (40 mg total) by mouth daily. 03/09/16   Josue Hector, MD  Blood Glucose Monitoring Suppl (ONE TOUCH ULTRA 2) W/DEVICE KIT Use as directed Dx E11.9 05/18/14   Evie Lacks [name], MD  Blood Pressure Monitor KIT Use to check blood pressure daily Dx I10 10/04/15   Evie Lacks [name], MD  ceFAZolin (ANCEF) 2-4 GM/100ML-% IVPB Inject 100 mLs (2 g total) into the vein every 8 (eight) hours. 08/04/16 08/18/16  Velvet Bathe, MD  ceFAZolin (ANCEF) IVPB Inject 2 g into the vein every 8 (eight) hours.  Indication:  Left wrist septic arthritis with MSSA bacteremia Last Day of Therapy:  08/18/16 Labs - Once weekly:  CBC/D and BMP, Labs - Every other week:  ESR and CRP 08/04/16 08/19/16  Velvet Bathe, MD  Cholecalciferol 1000 UNITS TBDP Take 1,000-5,000 Units by mouth daily. Takes 1000 units everyday except for on Saturday patient takes 5000 units    Historical Provider, MD  clopidogrel (PLAVIX) 75 MG tablet Take 1 tablet (75 mg total) by mouth daily with breakfast. 08/05/16   Velvet Bathe, MD  diltiazem (CARDIZEM CD) 180 MG 24 hr capsule Take 1 capsule (180 mg total) by mouth daily. 12/16/15   Wellington Hampshire, MD  fenofibrate 160 MG tablet TAKE 1 TABLET(160 MG) BY MOUTH DAILY 08/07/16   Wellington Hampshire, MD  ferrous sulfate 325 (65 FE) MG tablet Take 325 mg by mouth 3 (three) times daily with meals.    Historical Provider, MD  glipiZIDE (GLUCOTROL) 10 MG tablet Take 10 mg by mouth daily. 05/06/16   Historical Provider, MD  glucose blood (ONE TOUCH ULTRA TEST) test strip 1 each by Other route 4 (four) times daily. Use to check blood sugar four times a day  Dx: E11.9- insulin Dependant 04/06/16   Cassandria Anger, MD  HYDROcodone-acetaminophen (NORCO/VICODIN) 5-325 MG tablet Take 1 tablet by mouth 2 (two) times daily as needed for moderate pain or severe pain. 12/16/15   Evie Lacks [name], MD  Insulin Glargine (TOUJEO SOLOSTAR) 300 UNIT/ML SOPN Inject 50 Units into the skin daily. Titrate up by 1 unit every 1-2 days for goal sugars of 120-150 Patient taking differently: Inject 52 Units into the skin daily.  04/17/16 04/17/20  Evie Lacks [name], MD  Insulin Pen Needle 31G X 5 MM MISC 1 Units by Does not apply route 4 (four) times daily. 03/17/16   Cassandria Anger, MD  Lancets (ONETOUCH ULTRASOFT) lancets Use to check blood sugars daily 05/18/14   Cassandria Anger, MD  levothyroxine (SYNTHROID, LEVOTHROID) 150 MCG tablet TAKE 1 TABLET BY MOUTH DAILY BEFORE BREAKFAST 03/16/16   Cassandria Anger, MD    meclizine (ANTIVERT) 12.5 MG tablet Take 1 tablet (12.5 mg total) by mouth 2 (two) times daily as needed for dizziness (do not use for more than 3days). 07/07/16   Flossie Buffy, NP  metoprolol 75 MG TABS Take 75 mg by mouth 2 (two) times daily. 08/04/16   Velvet Bathe, MD  omeprazole (PRILOSEC) 40 MG capsule TAKE 1 CAPSULE BY MOUTH EVERY DAY 05/04/16   Evie Lacks [name], MD  vancomycin (VANCOCIN) 50 mg/mL oral solution Take 2.5 mLs (125 mg total) by mouth every 6 (six) hours. 08/04/16 08/25/16  Velvet Bathe, MD    Family History Family History  Problem Relation Age of Onset  . Heart attack Father   . Lung cancer Father   . Diabetes Father   . Stroke Father   . Hypertension Father   . Stroke Mother   . Hypertension Mother   . Cancer Brother     liver  . Heart disease Brother     chf  . COPD Sister   . Malignant hyperthermia Neg Hx     Social History Social History  Substance Use Topics  . Smoking status: Former Smoker    Packs/day: 0.70    Years: 48.00    Types: Cigarettes    Quit date: 03/29/2006  . Smokeless tobacco: Never Used  . Alcohol use 0.6 oz/week    1 Cans of beer per week     Comment: occasional alcohol intake     Allergies   Percocet [oxycodone-acetaminophen]; Invokana [canagliflozin]; and Metformin and related   Review of Systems Review of Systems  Constitutional: Positive for chills. Negative for fever.  HENT: Negative for trouble swallowing.   Eyes: Negative for photophobia and visual disturbance.  Respiratory: Positive for cough. Negative for shortness of breath and wheezing.   Cardiovascular: Negative for chest pain.  Gastrointestinal: Positive for nausea. Negative for abdominal pain, blood in stool, constipation, diarrhea and vomiting.  Genitourinary: Negative for difficulty urinating and dysuria.  Musculoskeletal: Positive for arthralgias and joint swelling (Left wrist ). Negative for neck pain and neck stiffness.  Skin: Positive for color  change and wound (Left dorsal wrist.  ).       PICC line on the right upper extremity.   Neurological: Positive for weakness. Negative for facial asymmetry, speech difficulty and numbness.     Physical Exam Updated Vital Signs BP 162/66   Pulse 64   Temp 98.4 F (36.9 C) (Oral)   Resp 13   SpO2 95%   Physical Exam  Constitutional: He is oriented to person, place, and time. He appears well-developed and well-nourished.  HENT:  Head: Normocephalic and atraumatic.  Nose: Nose normal.  Mouth/Throat: Oropharynx is clear and moist.  Eyes: Conjunctivae and EOM are normal. Pupils are equal, round, and reactive to light.  Neck: Normal range of motion. Neck supple.  Cardiovascular: Normal rate and regular rhythm.   Pulmonary/Chest: Effort normal. No accessory muscle usage. No respiratory distress. He has no wheezes. He has no rales.  Abdominal: He exhibits distension. There is no tenderness. There is no rebound and no guarding.  Genitourinary: Rectal exam shows tenderness and guaiac positive stool. Rectal exam shows no external hemorrhoid, no internal hemorrhoid and no mass.  Genitourinary Comments: Chaperone present for occult blood test.   Musculoskeletal: He exhibits edema (lower leg 2+  ) and tenderness. He exhibits no deformity.   Edema, tenderness at left dorsal wrist. Currently being treated.   Neurological: He is alert and oriented to person, place, and time.  Cranial Nerves:  III,IV, VI: ptosis not present, extra-ocular movements intact bilaterally, direct and consensual pupillary light reflexes intact bilaterally V: facial sensation, jaw opening, and bite strength equal bilaterally VII: eyebrow raise, eyelid close, smile, frown, pucker equal bilaterally VIII: hearing grossly normal bilaterally  IX,X: palate elevation and swallowing intact XI: bilateral shoulder shrug and lateral head rotation equal and strong XII: midline tongue extension  Cerebellar tests negative.  Sensory  intact.  Muscle strength 5/5 Patient able to ambulate without difficulty.   Skin: Skin is warm. There is erythema.  Improving edema, tenderness at left dorsal wrist. Currently being treated.  PICC line on the right upper extremity is without tenderness, erythema. Does not currently look infectious.  Psychiatric: He has a normal mood and affect. His behavior is normal.  Nursing note and vitals reviewed.    ED Treatments / Results  Labs (all labs ordered are listed, but only abnormal results are displayed) Labs Reviewed  CBC WITH DIFFERENTIAL/PLATELET - Abnormal; Notable for the following:       Result Value   RBC 2.96 (*)    Hemoglobin 8.5 (*)    HCT 27.1 (*)    RDW 18.3 (*)    All other components within normal limits  COMPREHENSIVE METABOLIC PANEL - Abnormal; Notable for the following:    Glucose, Bld 124 (*)    Creatinine, Ser 1.49 (*)    Calcium 8.4 (*)    Albumin 2.7 (*)    ALT 9 (*)    GFR calc non Af Amer 45 (*)    GFR calc Af Amer 52 (*)    All other components within normal limits  POC OCCULT BLOOD, ED - Abnormal; Notable for the following:    Fecal Occult Bld POSITIVE (*)    All other components within normal limits  SEDIMENTATION RATE  C-REACTIVE PROTEIN  BRAIN NATRIURETIC PEPTIDE  I-STAT TROPOININ, ED  I-STAT CG4 LACTIC ACID, ED    EKG  EKG Interpretation None       Radiology Dg Chest 2 View  Result Date: 08/10/2016 CLINICAL DATA:  Shortness breath.  17 pound weight gain in 2 weeks. EXAM: CHEST  2 VIEW COMPARISON:  One-view chest x-ray 08/03/2016. FINDINGS: The heart is enlarged. Atherosclerotic calcifications are present in the aorta. A right-sided PICC line is stable. New bilateral pleural effusions and basilar airspace disease are present. Interstitial edema has increased. No other significant airspace consolidation is present. Vertebral body heights and alignment are maintained. IMPRESSION: 1. Cardiomegaly with increasing interstitial edema and  bilateral pleural effusions compatible with congestive heart failure. 2. Aortic atherosclerosis. Electronically Signed   By: San Morelle M.D.   On: 08/10/2016 17:46    Procedures Procedures (including critical care time)  Medications Ordered in ED Medications  sodium chloride flush (NS) 0.9 % injection 10-40 mL (not administered)  albuterol (PROVENTIL) (2.5 MG/3ML) 0.083% nebulizer solution 5 mg (5 mg Nebulization Given 08/10/16 1847)     Initial Impression / Assessment and Plan / ED Course  I have reviewed the triage vital signs and the nursing notes.  Pertinent labs & imaging results that were available during my care of the patient were reviewed by me and considered in my medical decision making (see chart for details).  Clinical Course   Patient  76 year old male presenting with shortness of breath for last 4 days. On exam patient afebrile, vital signs stable, no apparent distress. Patient does seem to have some shortness of breath but not using any accessory muscles with oxygen saturation of greater than 95% throughout visit. Heart sounds clear. Lung sounds are clear. Abdomen distended but nontender, no rebound or guarding. Mild pedal edema noted bilaterally 2+. Lab work is consistent with previous findings. Patient does show worsening anemia since last visit but has had similar hemoglobin and hematocrit findings in the past. Cold blood positive but no frank blood during exam. Troponin is negative at 0.04 at this time. EKG shows no acute findings at this time. Chest X-ray does show cardiomegaly with increasing interstitial edema and bilateral pleural effusions compatible with congestive heart failure. Awaiting BNP result.  At shift change care  was transferred to [facility] who will follow pending studies, re-evaulate and determine disposition.    Final Clinical Impressions(s) / ED Diagnoses   Final diagnoses:  None    New Prescriptions New Prescriptions   No medications  on file     [place], [place] 08/10/16 2019-10-06    Leo Grosser, MD 08/11/16 [phone]

## 2016-08-10 NOTE — ED Notes (Signed)
PA at bedside.

## 2016-08-10 NOTE — ED Triage Notes (Signed)
Sob since he was in the hospiall on the first has gained 17 # in 2 weeks , has hx of staph infection and sepsis

## 2016-08-10 NOTE — Telephone Encounter (Signed)
I spoke with Alvino Blood, HHN. Precious Bard states pt's weight 07/27/16 was 260 lbs, today weight 271 lbs Pt's BP 210/80, HR 60, 2+ pitting edema, pt in no acute distress.  Patti states lasix has been on hold since discharge from the hospital, a BMET was done today ordered by infectious disease.  I reviewed with Dr Curt Bears.  Dr Curt Bears recommended pt report to ED for further evaluation, Patti aware Dr Curt Bears recommends evaluation in ED, she will let pt know Dr Curt Bears recommendation.

## 2016-08-10 NOTE — Telephone Encounter (Signed)
New Message     Pt sob , retaining fluid , elevated BP

## 2016-08-10 NOTE — ED Provider Notes (Signed)
  Patient care seemed from previous provider at the time of shift change.  Please see previous provider's note for full H&P.  Patient most recently admitted to the hospital on 07/27/2016 with symptomatic anemia secondary to GI bleed, with concern for ACS.  Patient subsequently had cardiac catheter, with subsequent cellulitis of the left wrist.  Patient notes after hospital discharge he was feeling well, but 3 days ago started have shortness of breath or ambulation, worse with lying flat, with associated lower extremity edema.  Patient notes that at his hospital discharge Lasix was discontinued due to elevated kidney function.   Patient noted to shortness of breath, lower extremity edema, elevated BNP, and chest x-ray consistent with heart failure. Patients most recent echo showed 6065% EF in January 2018.  Patients workup notable for hemoglobin of 8.5 down from 10.8  11 days ago the patient also having Hemoccult positive stool. Pt notes 17 lb weight gain since hospital discharge.  Pt case discussed with [facility] who agrees for admission.   Vitals:   08/10/16 2130 08/10/16 2245  BP: 157/59 (!) 192/52  Pulse: 70 69  Resp: 12 21  Temp:             Okey Regal, PA-C 08/10/16 2257    Leo Grosser, MD 08/11/16 [phone]

## 2016-08-10 NOTE — H&P (Signed)
History and Physical  Patient Name: Michael [name]     XNT:[id number]    DOB: 1941/02/04    DOA: 08/10/2016 PCP: [name], MD   Patient coming from: Home  Chief Complaint: Leg swelling, weight gain, dyspnea, orthopnea  HPI: Michael [name] is a 76 y.o. male with a past medical history significant for HFpEF, psoriasis on Humira, CKD III baseline Cr 1.4, IDDM, iron def anemia, pAF on apixaban, hypothyoridism, HTN, and CAD with recent DES to [place] who presents with 1 week swelling and now dyspnea/orthopnea.  The patient was admitted on 1/1 with chest pain, troponin turned positive, and he underwent LHC on 1/3 with DES to OM1.  Unfortunately, during that hospitalization he developed an infection in the IV placed by EMS in his left hand, which turned into a septic thrombophlebitis with MSSA bacteremia.  He was seen by ID and is being treated with IV cefazolin through a PICC.  In addition, he was diagnosed with a recurrence of C diff, and is on oral vancomycin.  Lastly, during that hospitalization, his creatinine rose to 2.0 on HD 3 and so his ARB and diuretics were stopped; creatinine returned to 1.5 at discharge.  Patient notes that he gained 17 lbs during his hospitalization.  Since discharge, he is still 10 lbs above his normal weight of 261 lbs, and he has had worsening leg swelling, shortness of breath with exertion and new orthopnea in the last week.  Today, his HHN noted his BP was 967 systolic and he appeared short of breath, so she called his PCP who rec'd that he be evaluated in the ER.  ED course: -Afebrile, heart rate 68, respirations 24, blood pressure 153/86, pulse oximetry normal on room air -Na 138, K 4.5, Cr 1.49 (baseline 1.4-1.5), WBC 6.5K, Hgb 8.5 (last baseline was 10.8) -Troponin negative -Lactate 1.3 -FOBT positive, he denied melena or hematochezia -ESR and CRP mildly elevated -BNP 468 up from 64 during last hospitalization -Chest x-ray shows mild interstitial edema -ECG  shows sinus rhythm -He was given furosemide 40 mg IV and TRH were asked to evaluate for CHF flare     ROS: Review of Systems  Respiratory: Positive for shortness of breath.   Cardiovascular: Positive for orthopnea, leg swelling and PND.  All other systems reviewed and are negative.         Past Medical History:  Diagnosis Date  . Allergy   . Arthritis   . AVM (arteriovenous malformation) of colon   . CAD (coronary artery disease)    a. Cath 10/2014 - Mild to moderate diagonal, circumflex and obtuse marginal disease. Innominate artery sternosis.  . Carotid artery disease ([place])    a. Carotid dopple 03/2014 59-16% RICA &  38-46% LICA stenosis b. 6/59  . CKD (chronic kidney disease), stage III   . Clostridium difficile colitis 05/13/2016  . Dysrhythmia    ATRIAL FIBRILATION  . Elevated troponin    a. 09/2014 in setting of AF RVR-->Myoview: EF 49%, no ischemia/infarct->Med Rx.  . GERD (gastroesophageal reflux disease)   . Gout   . H/O transfusion of whole blood   . History of PFTs    PFTs 6/16:  FVC 3.73 (87%), FEV1 2.93 (88%), FEV1/FVC 78%, DLCO 69%  . Hx of adenomatous colonic polyps 07/02/2016  . Hyperlipidemia   . Hypertension   . Hypothyroidism   . Iron deficiency anemia   . PAF (paroxysmal atrial fibrillation) ([place])    a. 09/2014: Converted on Dilt;  b.  CHA2DS2VASc = 3-->eliquis;  c. 09/2014 Echo: EF 55-60%, mild LVH, mildly dil LA.  [name] Personal history of colonic polyps 2007, 2008   adenoma each time, largest 12 mm in 2007  . Psoriasis   . Type II diabetes mellitus ([place])    TYPE 2    Past Surgical History:  Procedure Laterality Date  . APPENDECTOMY  02/09/2014  . CARDIAC CATHETERIZATION N/A 07/29/2016   Procedure: Right/Left Heart Cath and Coronary Angiography;  Surgeon: Nelva Bush, MD;  Location: [facility];  Service: Cardiovascular;  Laterality: N/A;  . CARDIAC CATHETERIZATION N/A 07/29/2016   Procedure: Coronary Stent Intervention;  Surgeon: Nelva Bush, MD;  Location: [facility];  Service: Cardiovascular;  Laterality: N/A;  . COLONOSCOPY  02/10/2011   internal hemorrhoids  . COLONOSCOPY W/ POLYPECTOMY  04/09/2006   12 mm adenoma  . COLONOSCOPY W/ POLYPECTOMY  06/17/2007   5 mm adenoma  . COLONOSCOPY WITH PROPOFOL N/A 05/13/2016   Procedure: COLONOSCOPY WITH PROPOFOL;  Surgeon: Manus Gunning, MD;  Location: [facility];  Service: Gastroenterology;  Laterality: N/A;  . ESOPHAGOGASTRODUODENOSCOPY  12/23/2011   Procedure: ESOPHAGOGASTRODUODENOSCOPY (EGD);  Surgeon: Irene Shipper, MD;  Location: [facility];  Service: Endoscopy;  Laterality: N/A;  with small bowel bx's  . GIVENS CAPSULE STUDY  12/28/2011  . KNEE ARTHROSCOPY Right   . LAPAROSCOPIC APPENDECTOMY N/A 02/09/2014   Procedure: APPENDECTOMY LAPAROSCOPIC;  Surgeon: Leighton Ruff, MD;  Location: [facility];  Service: General;  Laterality: N/A;  . LEFT HEART CATHETERIZATION WITH CORONARY ANGIOGRAM N/A 11/15/2014   Procedure: LEFT HEART CATHETERIZATION WITH CORONARY ANGIOGRAM;  Surgeon: Belva Crome, MD;  Location: [facility]ATH LAB;  Service: Cardiovascular;  Laterality: N/A;  . ROTATOR CUFF REPAIR     left    Social History: Patient lives with his wife.  The patient walks unassisted.  He is not a smoker.  He is a former smoker.  He drove a truck.  He is from [place].    Allergies  Allergen Reactions  . Percocet [Oxycodone-Acetaminophen] Nausea And Vomiting  . Invokana [Canagliflozin]     Side effects  . Metformin And Related     Upset stomach    Family history: family history includes COPD in his sister; Cancer in his brother; Diabetes in his father; Heart attack in his father; Heart disease in his brother; Hypertension in his father and mother; Lung cancer in his father; Stroke in his father and mother.  Prior to Admission medications   Medication Sig Start Date End Date Taking? Authorizing Provider  acetaminophen (TYLENOL) 325 MG tablet Take 2 tablets (650 mg total)  by mouth every 4 (four) hours as needed for mild pain, fever or headache. 08/04/16  Yes Velvet Bathe, MD  allopurinol (ZYLOPRIM) 100 MG tablet Take 2 tablets (200 mg total) by mouth daily. 02/03/16  Yes Evie Lacks [name], MD  amiodarone (PACERONE) 200 MG tablet TAKE 1 TABLET BY MOUTH DAILY 06/24/16  Yes Wellington Hampshire, MD  apixaban (ELIQUIS) 5 MG TABS tablet Take 1 tablet (5 mg total) by mouth 2 (two) times daily. 06/02/16  Yes Wellington Hampshire, MD  aspirin 81 MG chewable tablet Chew 1 tablet (81 mg total) by mouth daily. 08/05/16  Yes Velvet Bathe, MD  atorvastatin (LIPITOR) 40 MG tablet Take 1 tablet (40 mg total) by mouth daily. 03/09/16  Yes Josue Hector, MD  ceFAZolin (ANCEF) IVPB Inject 2 g into the vein every 8 (eight) hours. Indication:  Left wrist  septic arthritis with MSSA bacteremia Last Day of Therapy:  08/18/16 Labs - Once weekly:  CBC/D and BMP, Labs - Every other week:  ESR and CRP 08/04/16 08/19/16 Yes Velvet Bathe, MD  Cholecalciferol 1000 UNITS TBDP Take 1,000-5,000 Units by mouth daily. Takes 1000 units everyday except for on Saturday patient takes 5000 units   Yes Historical Provider, MD  clopidogrel (PLAVIX) 75 MG tablet Take 1 tablet (75 mg total) by mouth daily with breakfast. 08/05/16  Yes Velvet Bathe, MD  diltiazem (CARDIZEM CD) 180 MG 24 hr capsule Take 1 capsule (180 mg total) by mouth daily. 12/16/15  Yes Wellington Hampshire, MD  fenofibrate 160 MG tablet TAKE 1 TABLET(160 MG) BY MOUTH DAILY 08/07/16  Yes Wellington Hampshire, MD  ferrous sulfate 325 (65 FE) MG tablet Take 325 mg by mouth 3 (three) times daily with meals.   Yes Historical Provider, MD  glipiZIDE (GLUCOTROL) 10 MG tablet Take 10 mg by mouth daily. 05/06/16  Yes Historical Provider, MD  HYDROcodone-acetaminophen (NORCO/VICODIN) 5-325 MG tablet Take 1 tablet by mouth 2 (two) times daily as needed for moderate pain or severe pain. 12/16/15  Yes Evie Lacks [name], MD  Insulin Glargine (TOUJEO SOLOSTAR) 300 UNIT/ML SOPN  Inject 50 Units into the skin daily. Titrate up by 1 unit every 1-2 days for goal sugars of 120-150 Patient taking differently: Inject 52 Units into the skin daily.  04/17/16 04/17/20 Yes Evie Lacks [name], MD  levothyroxine (SYNTHROID, LEVOTHROID) 150 MCG tablet TAKE 1 TABLET BY MOUTH DAILY BEFORE BREAKFAST 03/16/16  Yes Cassandria Anger, MD  meclizine (ANTIVERT) 12.5 MG tablet Take 1 tablet (12.5 mg total) by mouth 2 (two) times daily as needed for dizziness (do not use for more than 3days). 07/07/16  Yes Charlene Brooke Nche, NP  metoprolol 75 MG TABS Take 75 mg by mouth 2 (two) times daily. 08/04/16  Yes Velvet Bathe, MD  omeprazole (PRILOSEC) 40 MG capsule TAKE 1 CAPSULE BY MOUTH EVERY DAY 05/04/16  Yes Evie Lacks [name], MD  vancomycin (VANCOCIN) 50 mg/mL oral solution Take 2.5 mLs (125 mg total) by mouth every 6 (six) hours. 08/04/16 08/25/16 Yes Velvet Bathe, MD  Blood Glucose Monitoring Suppl (ONE TOUCH ULTRA 2) W/DEVICE KIT Use as directed Dx E11.9 05/18/14   Evie Lacks [name], MD  Blood Pressure Monitor KIT Use to check blood pressure daily Dx I10 10/04/15   Evie Lacks [name], MD  glucose blood (ONE TOUCH ULTRA TEST) test strip 1 each by Other route 4 (four) times daily. Use to check blood sugar four times a day  Dx: E11.9- insulin Dependant 04/06/16   Evie Lacks [name], MD  Insulin Pen Needle 31G X 5 MM MISC 1 Units by Does not apply route 4 (four) times daily. 03/17/16   Cassandria Anger, MD  Lancets [facility] ULTRASOFT) lancets Use to check blood sugars daily 05/18/14   Cassandria Anger, MD       Physical Exam: BP (!) 192/52   Pulse 69   Temp 98.4 F (36.9 C) (Oral)   Resp 21   SpO2 96%  General appearance: Well-developed, obese elderly adult male, alert and in no acute distress.   Eyes: Anicteric, conjunctiva pink, lids and lashes normal. PERRL.    ENT: No nasal deformity, discharge, epistaxis.  Hearing normal. OP moist without lesions.   Neck: No neck masses.   Trachea midline.  No thyromegaly/tenderness. Lymph: No cervical or supraclavicular lymphadenopathy. Skin: Warm and dry.  No jaundice.  There  is a pustule on the back of the right hand with some surrounding erythema, no drainage, better per family. Cardiac: RRR, nl S1-S2, SEM present.  Capillary refill is brisk.  JVP not visible.  2+ LE edema pitting to above knees.  Radial pulses 2+ and symmetric, DP pulses diminished. Respiratory: Normal respiratory rate and rhythm.  CTAB without wheezes.  Crackles at both bases. Abdomen: Abdomen soft.  No TTP. No ascites, distension, hepatosplenomegaly.   MSK: No deformities or effusions.  No cyanosis or clubbing. Neuro: Cranial nerves normal.  Sensation intact to light touch. Speech is fluent.  Muscle strength normal and symmetric.    Psych: Sensorium intact and responding to questions, attention normal.  Behavior appropriate.  Affect normal.  Judgment and insight appear normal.     Labs on Admission:  I have personally reviewed following labs and imaging studies: CBC:  Recent Labs Lab 08/10/16 1750  WBC 6.5  NEUTROABS 5.4  HGB 8.5*  HCT 27.1*  MCV 91.6  PLT 947   Basic Metabolic Panel:  Recent Labs Lab 08/10/16 1750  NA 138  K 4.5  CL 104  CO2 26  GLUCOSE 124*  BUN 19  CREATININE 1.49*  CALCIUM 8.4*   GFR: Estimated Creatinine Clearance: 59.1 mL/min (by C-G formula based on SCr of 1.49 mg/dL (H)).  Liver Function Tests:  Recent Labs Lab 08/10/16 1750  AST 23  ALT 9*  ALKPHOS 67  BILITOT 0.6  PROT 6.6  ALBUMIN 2.7*   No results for input(s): LIPASE, AMYLASE in the last 168 hours. No results for input(s): AMMONIA in the last 168 hours. Coagulation Profile: No results for input(s): INR, PROTIME in the last 168 hours. Cardiac Enzymes: No results for input(s): CKTOTAL, CKMB, CKMBINDEX, TROPONINI in the last 168 hours. BNP (last 3 results) No results for input(s): PROBNP in the last 8760 hours. HbA1C: No results for  input(s): HGBA1C in the last 72 hours. CBG:  Recent Labs Lab 08/04/16 0721 08/04/16 1148  GLUCAP 158* 170*   Lipid Profile: No results for input(s): CHOL, HDL, LDLCALC, TRIG, CHOLHDL, LDLDIRECT in the last 72 hours. Thyroid Function Tests: No results for input(s): TSH, T4TOTAL, FREET4, T3FREE, THYROIDAB in the last 72 hours. Anemia Panel: No results for input(s): VITAMINB12, FOLATE, FERRITIN, TIBC, IRON, RETICCTPCT in the last 72 hours. Sepsis Labs: Lactic acid 1.31 Invalid input(s): PROCALCITONIN, LACTICIDVEN No results found for this or any previous visit (from the past 240 hour(s)).       Radiological Exams on Admission: Personally reviewed CXR shows edema: Dg Chest 2 View  Result Date: 08/10/2016 CLINICAL DATA:  Shortness breath.  17 pound weight gain in 2 weeks. EXAM: CHEST  2 VIEW COMPARISON:  One-view chest x-ray 08/03/2016. FINDINGS: The heart is enlarged. Atherosclerotic calcifications are present in the aorta. A right-sided PICC line is stable. New bilateral pleural effusions and basilar airspace disease are present. Interstitial edema has increased. No other significant airspace consolidation is present. Vertebral body heights and alignment are maintained. IMPRESSION: 1. Cardiomegaly with increasing interstitial edema and bilateral pleural effusions compatible with congestive heart failure. 2. Aortic atherosclerosis. Electronically Signed   By: San Morelle M.D.   On: 08/10/2016 17:46     EKG: Independently reviewed. Rate 65, QTc 468, no ST changes.  Echocardiogram Jan 2018: Severe LVH EF 60-65% Grade 2 DD PAP 53 Moderate AV stenosis    Assessment/Plan  1. Acute on chronic diastolic CHF:  Elevated BNP, edema on CXR, leg swelling, dyspnea on exertion and orthopnea  after stopping furosemide and getting fluids in setting of NSTEMI and MSSA bacteremia.   -Monitor on telemetry -Furosemide 40 mg BID -Potass supplement -Strict I/Os, daily weights -Daily  BMP -Restart ARB     2. MSSA bacteremia  This was from a septic thrombophlebitis during last hospitalization.  Improving.   -Continue IV cefazolin 2 g q8hrs  -Norco for pain, PRN    3. CKD stage III:  Baseline Cr 1.4-1.5.  At baseline now. -Daily BMP while diuresing    4. Atrial fibrillation, paroxysmal: CHADS-VASc 5 (age, CHF, CAD, HTN, DM).  Sinus at admission.   -Continue diltiazem, amiodarone and BB -Continue apixaban   5. Hypertension and coronary disease secondary prevention: Hypertensive at admission in setting of CHF. Recent stent to OM1 on 1/3.  To be on triple therapy for 1 month then Plavix/apixaban. -Restart ARB for now, monitor renal function -Continue BB, dilt, furosemide -Continue statin, aspirin, Plavix  -Continue fibrate   6. Chronic iron deficiency anemia:  Hgb 8 at last admission, increased to 10 by next day.  No reports of bleeding now. -Continue iron -Repeat CBC tomorrow   7. Psoriasis: Was previously on Humira (last dose Dec).  Will resume after bacteremia, C diff are resolved.   8. Hypothyroidism TSH normal last admission. -Continue levothyxoein  9. Gout:  -Continue allopurinol   10. Insulin dependent diabetes:  -Continue glargine -Hold glipizide -SSI with meals  11. C diff colitis:  -Continue oral vancomycin  12. Other medications:  -Continue PPI       DVT prophylaxis: N/A on apixaban  Code Status: FULL  Family Communication: Wife at bedside, overnight plan discussed, CODE STATUS confirmed  Disposition Plan: Anticipate IV diuresis and close monitoring or renal function and electrolytes and fluid status Consults called: None Admission status: INPATIENT        Medical decision making: Patient seen at 10:30 PM on 08/10/2016.  The patient was discussed with Lenn Sink, PA-C.  What exists of the patient's chart was reviewed in depth and summarized above.  Clinical condition: stable.        Edwin Dada [facility] Pager [phone]      At the time of admission, it appears that the appropriate admission status for this patient is INPATIENT. This is judged to be reasonable and necessary in order to provide the required intensity of service to ensure the patient's safety given the presenting symptoms, physical exam findings, and initial radiographic and laboratory data in the context of their chronic comorbidities.  Together, these circumstances are felt to place him at high risk for further clinical deterioration threatening life, limb, or organ.   Patient requires inpatient status due to high intensity of service, high risk for further deterioration and high frequency of surveillance required because of this severe exacerbation of their chronic organ failure.  Factors supporting inpatient status: Presentation tachypneic, with respirations >20/min and peripheral edema and edema on chest x-ray History of chronic diastolic congestive heart failure Recent (active) MSSA staph bacteremia C diff colitis hsitory of insulin dependent diabetes Hsitroy of chronic kidney disease History of atrial fibrillation on anticoagulation  I certify that at the point of admission it is my clinical judgment that the patient will require inpatient hospital care spanning beyond 2 midnights from the point of admission and that early discharge would result in unnecessary risk of decompensation and readmission or threat to life, limb or bodily function.

## 2016-08-10 NOTE — ED Notes (Signed)
Contacted phlebotomy and made them aware of need for labs - IV team consult placed to inquire whether or not PICC may be used.

## 2016-08-11 ENCOUNTER — Encounter (HOSPITAL_COMMUNITY): Payer: Self-pay | Admitting: Rehabilitation

## 2016-08-11 LAB — CBC
HCT: 25.8 % — ABNORMAL LOW (ref 39.0–52.0)
Hemoglobin: 8.1 g/dL — ABNORMAL LOW (ref 13.0–17.0)
MCH: 28.7 pg (ref 26.0–34.0)
MCHC: 31.4 g/dL (ref 30.0–36.0)
MCV: 91.5 fL (ref 78.0–100.0)
PLATELETS: 172 10*3/uL (ref 150–400)
RBC: 2.82 MIL/uL — ABNORMAL LOW (ref 4.22–5.81)
RDW: 18.3 % — ABNORMAL HIGH (ref 11.5–15.5)
WBC: 5.3 10*3/uL (ref 4.0–10.5)

## 2016-08-11 LAB — BASIC METABOLIC PANEL
Anion gap: 8 (ref 5–15)
BUN: 18 mg/dL (ref 6–20)
CALCIUM: 8.4 mg/dL — AB (ref 8.9–10.3)
CHLORIDE: 103 mmol/L (ref 101–111)
CO2: 28 mmol/L (ref 22–32)
CREATININE: 1.52 mg/dL — AB (ref 0.61–1.24)
GFR calc Af Amer: 51 mL/min — ABNORMAL LOW (ref >60)
GFR calc non Af Amer: 44 mL/min — ABNORMAL LOW (ref >60)
GLUCOSE: 97 mg/dL (ref 65–99)
Potassium: 4 mmol/L (ref 3.5–5.1)
Sodium: 139 mmol/L (ref 135–145)

## 2016-08-11 LAB — GLUCOSE, CAPILLARY
GLUCOSE-CAPILLARY: 142 mg/dL — AB (ref 65–99)
GLUCOSE-CAPILLARY: 109 mg/dL — AB (ref 65–99)
Glucose-Capillary: 155 mg/dL — ABNORMAL HIGH (ref 65–99)
Glucose-Capillary: 154 mg/dL — ABNORMAL HIGH (ref 65–99)

## 2016-08-11 MED ORDER — SODIUM CHLORIDE 0.9 % IV SOLN: 750.0000 mg | Freq: Once | INTRAVENOUS | Status: DC

## 2016-08-11 MED ORDER — CEFAZOLIN SODIUM-DEXTROSE 2-4 GM/100ML-% IV SOLN
2.0000 g | Freq: Three times a day (TID) | INTRAVENOUS | Status: DC
  Administered 2016-08-11 – 2016-08-16 (×14): 2 g via INTRAVENOUS
  Filled 2016-08-11 (×16): qty 100

## 2016-08-11 MED ORDER — SODIUM CHLORIDE 0.9 % IV SOLN
510.0000 mg | Freq: Once | INTRAVENOUS | Status: AC
  Administered 2016-08-11: 510 mg via INTRAVENOUS
  Filled 2016-08-11: qty 17

## 2016-08-11 NOTE — Progress Notes (Signed)
Patient arrived in the unit accompanied by the NT via stretcher. Orientation to the unit given. Patient verbalizes understanding.

## 2016-08-11 NOTE — Progress Notes (Signed)
PROGRESS NOTE [facility]   Michael [name]   UKG:[id number] DOB: Jul 20, 1941  DOA: 08/10/2016 PCP: [name], MD   Brief Narrative:  Michael [name] is a 76 y.o. male with a past medical history significant for HFpEF, psoriasis on Humira, CKD III baseline Cr 1.4, IDDM, iron def anemia, pAF on apixaban, hypothyoridism, HTN, and CAD with recent DES to OM1 who presents with 1 week swelling and now dyspnea/orthopnea.  Admitted with CHF exacerbation started on IV lasix, and a dropped in hemoglobin which is been monitored   Subjective: Patient seen and examined today feeling much better, denies chest pain and SOB. Good urine output.  Assessment & Plan: Acute on chronic diastolic CHF - ECHO EF 76-28% G2DD (08/01/16) Continue IV lasix  Daily weight  Low salt diet  Strict I&O's   MSSA Bacteremia /C Diff colitis  Continue home abx - Oral Vanco and Cefazolin   CKD stage III - Cr at baseline  Monitor BMP it may increase with diuresis   PAF - CHADSVASc 5 on Eliquis  On triple therapy due to recent stent  Will hold Eliquis given anemia and + FOB  Check cbc in AM  HR well controlled  Continue dilt, amio and BB Monitor telemetry    CAD - s/p DES on 1/3 on triple therapy for 1 month - then Plavix and Eliquis - asymptomatic  Continue ASA/Plavix  Continue BB and ARB   Chronic iron deficiency anemia  Will give 1 dose of Injectafer  Continue Iron  Monitor CBC in AM  Transfuse if Hgb < 7 or symptoms  Psoariasis - stable Previously on Humira - last dose on 06/2016  DVT prophylaxis: Patient on Eliquis - placed on hold due to Anemia and + FOB, SCDs Code Status: FULL  Family Communication: None at bedside Disposition Plan: Home when medically stable likely 2-3 days.   Consultants:   None   Procedures:   None  Antimicrobials:  Cefazolin and Oral Vanco     Objective: Vitals:   08/10/16 2300 08/11/16 0003 08/11/16 0625 08/11/16 1400  BP: 188/55 (!) 199/36 (!) 161/55  (!) 168/61  Pulse: 67 69 62 63  Resp: 19 20 18 20   Temp:  97.8 F (36.6 C) 97.5 F (36.4 C) 97.7 F (36.5 C)  TempSrc:  Oral Oral Oral  SpO2: 97% 96% 96% 96%  Weight:  125 kg (275 lb 8 oz) 123.9 kg (273 lb 3.2 oz)   Height:  5\' 11"  (1.803 m)      Intake/Output Summary (Last 24 hours) at 08/11/16 1444 Last data filed at 08/11/16 1400  Gross per 24 hour  Intake              730 ml  Output             4580 ml  Net            -3850 ml   Filed Weights   08/11/16 0003 08/11/16 0625  Weight: 125 kg (275 lb 8 oz) 123.9 kg (273 lb 3.2 oz)    Examination:  General exam: Appears calm and comfortable, Pale  HEENT: AC/AT, PERRLA, OP moist and clear Respiratory system: Clear to auscultation. No wheezes,crackle or rhonchi Cardiovascular system: S1 & S2 heard, Irr Irr. No JVD, murmurs, rubs or gallops Gastrointestinal system: Abdomen is nondistended, soft and nontender. No organomegaly or masses felt. Normal bowel sounds heard. Central nervous system: Alert and oriented. No focal neurological deficits. Extremities: 2+ pitting pedal edema. Symmetric,  strength 5/5   Skin: Pustule at the dorsal part of the R hand with surrounding erythema and multiple scabs, Mild edema  Psychiatry: Judgement and insight appear normal. Mood & affect appropriate.    Data Reviewed: I have personally reviewed following labs and imaging studies  CBC:  Recent Labs Lab 08/10/16 1750 08/11/16 0309  WBC 6.5 5.3  NEUTROABS 5.4  --   HGB 8.5* 8.1*  HCT 27.1* 25.8*  MCV 91.6 91.5  PLT 191 629   Basic Metabolic Panel:  Recent Labs Lab 08/10/16 1750 08/11/16 0309  NA 138 139  K 4.5 4.0  CL 104 103  CO2 26 28  GLUCOSE 124* 97  BUN 19 18  CREATININE 1.49* 1.52*  CALCIUM 8.4* 8.4*   GFR: Estimated Creatinine Clearance: 58 mL/min (by C-G formula based on SCr of 1.52 mg/dL (H)). Liver Function Tests:  Recent Labs Lab 08/10/16 1750  AST 23  ALT 9*  ALKPHOS 67  BILITOT 0.6  PROT 6.6  ALBUMIN  2.7*   No results for input(s): LIPASE, AMYLASE in the last 168 hours. No results for input(s): AMMONIA in the last 168 hours. Coagulation Profile: No results for input(s): INR, PROTIME in the last 168 hours. Cardiac Enzymes: No results for input(s): CKTOTAL, CKMB, CKMBINDEX, TROPONINI in the last 168 hours. BNP (last 3 results) No results for input(s): PROBNP in the last 8760 hours. HbA1C: No results for input(s): HGBA1C in the last 72 hours. CBG:  Recent Labs Lab 08/10/16 2340 08/11/16 0629 08/11/16 1214  GLUCAP 100* 142* 109*   Lipid Profile: No results for input(s): CHOL, HDL, LDLCALC, TRIG, CHOLHDL, LDLDIRECT in the last 72 hours. Thyroid Function Tests: No results for input(s): TSH, T4TOTAL, FREET4, T3FREE, THYROIDAB in the last 72 hours. Anemia Panel: No results for input(s): VITAMINB12, FOLATE, FERRITIN, TIBC, IRON, RETICCTPCT in the last 72 hours. Sepsis Labs:  Recent Labs Lab 08/10/16 1809  LATICACIDVEN 1.31    No results found for this or any previous visit (from the past 240 hour(s)).    Radiology Studies: Dg Chest 2 View  Result Date: 08/10/2016 CLINICAL DATA:  Shortness breath.  17 pound weight gain in 2 weeks. EXAM: CHEST  2 VIEW COMPARISON:  One-view chest x-ray 08/03/2016. FINDINGS: The heart is enlarged. Atherosclerotic calcifications are present in the aorta. A right-sided PICC line is stable. New bilateral pleural effusions and basilar airspace disease are present. Interstitial edema has increased. No other significant airspace consolidation is present. Vertebral body heights and alignment are maintained. IMPRESSION: 1. Cardiomegaly with increasing interstitial edema and bilateral pleural effusions compatible with congestive heart failure. 2. Aortic atherosclerosis. Electronically Signed   By: San Morelle M.D.   On: 08/10/2016 17:46    Scheduled Meds: . allopurinol  200 mg Oral Daily  . amiodarone  200 mg Oral Daily  . aspirin  81 mg Oral  Daily  . atorvastatin  40 mg Oral q1800  .  ceFAZolin (ANCEF) IV  2 g Intravenous Q8H  . clopidogrel  75 mg Oral Q breakfast  . diltiazem  180 mg Oral Daily  . fenofibrate  160 mg Oral Daily  . ferrous sulfate  325 mg Oral TID WC  . furosemide  40 mg Intravenous BID  . insulin aspart  0-15 Units Subcutaneous TID WC  . insulin aspart  0-5 Units Subcutaneous QHS  . insulin glargine  52 Units Subcutaneous Daily  . irbesartan  150 mg Oral Daily  . levothyroxine  150 mcg Oral QAC breakfast  .  metoprolol tartrate  75 mg Oral BID  . pantoprazole  40 mg Oral Daily  . potassium chloride  20 mEq Oral Daily  . vancomycin  125 mg Oral Q6H   Continuous Infusions:   LOS: 1 day   Chipper Oman, MD [facility] Pager [phone]  If 7PM-7AM, please contact night-coverage [email or web address] Password [facility] 08/11/2016, 2:44 PM

## 2016-08-11 NOTE — Progress Notes (Signed)
[facility]  Patient Status: Active (receiving services up to time of hospitalization)  [facility] is providing the following services: RN  If patient discharges after hours, please call [phone].   Michael [name] 08/11/2016, 2:26 PM

## 2016-08-11 NOTE — Care Management Note (Signed)
Case Management Note  Patient Details  Name: ELIYAS SUDDRETH MRN: [id number] Date of Birth: 08-12-1940  Subjective/Objective:        Admitted with CHF            Action/Plan: Patient lives at home with his spouse; PCP: [name], MD; has private insurance with Medicare; Was active with [facility] as prior to admission; CM will continue to follow for DCP  Expected Discharge Date:    possibly 08/14/2016              Expected Discharge Plan:  [place]  Discharge planning Services  CM Consult    Status of Service:  In process, will continue to follow  Sherrilyn Rist [phone] 08/11/2016, 10:46 AM

## 2016-08-12 DIAGNOSIS — K558 Other vascular disorders of intestine: Secondary | ICD-10-CM

## 2016-08-12 DIAGNOSIS — I25110 Atherosclerotic heart disease of native coronary artery with unstable angina pectoris: Secondary | ICD-10-CM

## 2016-08-12 DIAGNOSIS — I2510 Atherosclerotic heart disease of native coronary artery without angina pectoris: Secondary | ICD-10-CM

## 2016-08-12 DIAGNOSIS — I10 Essential (primary) hypertension: Secondary | ICD-10-CM

## 2016-08-12 DIAGNOSIS — E039 Hypothyroidism, unspecified: Secondary | ICD-10-CM

## 2016-08-12 DIAGNOSIS — I5033 Acute on chronic diastolic (congestive) heart failure: Secondary | ICD-10-CM

## 2016-08-12 DIAGNOSIS — I350 Nonrheumatic aortic (valve) stenosis: Secondary | ICD-10-CM

## 2016-08-12 DIAGNOSIS — E1151 Type 2 diabetes mellitus with diabetic peripheral angiopathy without gangrene: Secondary | ICD-10-CM

## 2016-08-12 DIAGNOSIS — I480 Paroxysmal atrial fibrillation: Secondary | ICD-10-CM

## 2016-08-12 DIAGNOSIS — M1000 Idiopathic gout, unspecified site: Secondary | ICD-10-CM

## 2016-08-12 LAB — GLUCOSE, CAPILLARY
GLUCOSE-CAPILLARY: 167 mg/dL — AB (ref 65–99)
Glucose-Capillary: 174 mg/dL — ABNORMAL HIGH (ref 65–99)
Glucose-Capillary: 142 mg/dL — ABNORMAL HIGH (ref 65–99)
Glucose-Capillary: 149 mg/dL — ABNORMAL HIGH (ref 65–99)

## 2016-08-12 LAB — CBC
HCT: 24.3 % — ABNORMAL LOW (ref 39.0–52.0)
HEMATOCRIT: 29.1 % — AB (ref 39.0–52.0)
HEMOGLOBIN: 7.7 g/dL — AB (ref 13.0–17.0)
Hemoglobin: 9.2 g/dL — ABNORMAL LOW (ref 13.0–17.0)
MCH: 28.9 pg (ref 26.0–34.0)
MCH: 28.4 pg (ref 26.0–34.0)
MCHC: 31.7 g/dL (ref 30.0–36.0)
MCHC: 31.6 g/dL (ref 30.0–36.0)
MCV: 91.4 fL (ref 78.0–100.0)
MCV: 89.8 fL (ref 78.0–100.0)
PLATELETS: 145 10*3/uL — AB (ref 150–400)
PLATELETS: 174 10*3/uL (ref 150–400)
RBC: 2.66 MIL/uL — AB (ref 4.22–5.81)
RBC: 3.24 MIL/uL — AB (ref 4.22–5.81)
RDW: 18.9 % — ABNORMAL HIGH (ref 11.5–15.5)
RDW: 19.1 % — AB (ref 11.5–15.5)
WBC: 4.1 10*3/uL (ref 4.0–10.5)
WBC: 4.5 10*3/uL (ref 4.0–10.5)

## 2016-08-12 LAB — BASIC METABOLIC PANEL
ANION GAP: 8 (ref 5–15)
BUN: 21 mg/dL — ABNORMAL HIGH (ref 6–20)
CHLORIDE: 101 mmol/L (ref 101–111)
CO2: 29 mmol/L (ref 22–32)
Calcium: 8.5 mg/dL — ABNORMAL LOW (ref 8.9–10.3)
Creatinine, Ser: 1.75 mg/dL — ABNORMAL HIGH (ref 0.61–1.24)
GFR calc Af Amer: 43 mL/min — ABNORMAL LOW (ref >60)
GFR calc non Af Amer: 37 mL/min — ABNORMAL LOW (ref >60)
GLUCOSE: 202 mg/dL — AB (ref 65–99)
POTASSIUM: 4 mmol/L (ref 3.5–5.1)
Sodium: 138 mmol/L (ref 135–145)

## 2016-08-12 LAB — PREPARE RBC (CROSSMATCH)

## 2016-08-12 MED ORDER — SODIUM CHLORIDE 0.9 % IV SOLN
Freq: Once | INTRAVENOUS | Status: AC
  Administered 2016-08-12: 12:00:00 via INTRAVENOUS

## 2016-08-12 NOTE — Consult Note (Signed)
CARDIOLOGY CONSULT NOTE   Patient ID: Michael [name] MRN: [id number], DOB/AGE: 08-14-1940   Admit date: 08/10/2016 Date of Consult: 08/12/2016   Primary Physician: [name], MD Primary Cardiologist: Dr. Janeth Rase by Dr. Tana Coast Reason for consult: Acute on chronic diastolic heart failure, GI bleed in the setting of triple therapy including aspirin, Plavix, and eliquis for A. Fib. He had a recent drug-eluting stent placed in [place] on 07/29/2016.  Pt. Profile  Michael [name] is a pleasant obese 76 year old male with past medical history of CAD s/p recent DES to [place] on 07/29/2016, PAF on eliquis, history of GI bleed with AVM, stage III CKD, HTN, HLD, DM, bilateral carotid stenosis and a right subclavian stenosis presented with worsening SOB, found to be in heart failure and also severe anemia with positive hemoccult.   Problem List  Past Medical History:  Diagnosis Date  . Acute on chronic heart failure ([place])   . Allergy   . Arthritis   . AVM (arteriovenous malformation) of colon   . C. difficile enteritis   . CAD (coronary artery disease)    a. Cath 10/2014 - Mild to moderate diagonal, circumflex and obtuse marginal disease. Innominate artery sternosis.  . Carotid artery disease ([place])    a. Carotid dopple 03/2014 99-83% RICA &  38-25% LICA stenosis b. 0/53  . CKD (chronic kidney disease), stage III   . Clostridium difficile colitis 05/13/2016  . Dyspnea   . Dysrhythmia    ATRIAL FIBRILATION  . Elevated troponin    a. 09/2014 in setting of AF RVR-->Myoview: EF 49%, no ischemia/infarct->Med Rx.  . GERD (gastroesophageal reflux disease)   . Gout   . H/O transfusion of whole blood   . Heart murmur   . History of PFTs    PFTs 6/16:  FVC 3.73 (87%), FEV1 2.93 (88%), FEV1/FVC 78%, DLCO 69%  . Hx of adenomatous colonic polyps 07/02/2016  . Hyperlipidemia   . Hypertension   . Hypothyroidism   . Iron deficiency anemia   . PAF (paroxysmal atrial fibrillation) ([place])    a. 09/2014:  Converted on D[place];  b. CHA2DS2VASc = 3-->eliquis;  c. 09/2014 Echo: EF 55-60%, mild LVH, mildly dil LA.  [name] Personal history of colonic polyps 2007, 2008   adenoma each time, largest 12 mm in 2007  . Psoriasis   . Staph infection 07/2016   left hand   . Type II diabetes mellitus ([place])    TYPE 2    Past Surgical History:  Procedure Laterality Date  . APPENDECTOMY  02/09/2014  . CARDIAC CATHETERIZATION N/A 07/29/2016   Procedure: Right/Left Heart Cath and Coronary Angiography;  Surgeon: Nelva Bush, MD;  Location: [facility];  Service: Cardiovascular;  Laterality: N/A;  . CARDIAC CATHETERIZATION N/A 07/29/2016   Procedure: Coronary Stent Intervention;  Surgeon: Nelva Bush, MD;  Location: [facility];  Service: Cardiovascular;  Laterality: N/A;  . COLONOSCOPY  02/10/2011   internal hemorrhoids  . COLONOSCOPY W/ POLYPECTOMY  04/09/2006   12 mm adenoma  . COLONOSCOPY W/ POLYPECTOMY  06/17/2007   5 mm adenoma  . COLONOSCOPY WITH PROPOFOL N/A 05/13/2016   Procedure: COLONOSCOPY WITH PROPOFOL;  Surgeon: Manus Gunning, MD;  Location: [facility];  Service: Gastroenterology;  Laterality: N/A;  . CORONARY STENT PLACEMENT  07/29/2016   OMI    DES  . ESOPHAGOGASTRODUODENOSCOPY  12/23/2011   Procedure: ESOPHAGOGASTRODUODENOSCOPY (EGD);  Surgeon: Irene Shipper, MD;  Location: [facility];  Service: Endoscopy;  Laterality: N/A;  with small bowel bx's  . GIVENS CAPSULE STUDY  12/28/2011  . KNEE ARTHROSCOPY Right   . LAPAROSCOPIC APPENDECTOMY N/A 02/09/2014   Procedure: APPENDECTOMY LAPAROSCOPIC;  Surgeon: Leighton Ruff, MD;  Location: [facility];  Service: General;  Laterality: N/A;  . LEFT HEART CATHETERIZATION WITH CORONARY ANGIOGRAM N/A 11/15/2014   Procedure: LEFT HEART CATHETERIZATION WITH CORONARY ANGIOGRAM;  Surgeon: Belva Crome, MD;  Location[facility];  Service: Cardiovascular;  Laterality: N/A;  . ROTATOR CUFF REPAIR     left     Allergies  Allergies  Allergen  Reactions  . Percocet [Oxycodone-Acetaminophen] Nausea And Vomiting  . Invokana [Canagliflozin]     Side effects  . Metformin And Related     Upset stomach    HPI   Michael [name] is a pleasant obese 76 year old male with past medical history of CAD s/p recent DES to OM1 on 07/29/2016, PAF on eliquis, history of GI bleed with AVM, stage III CKD, HTN, HLD, DM, bilateral carotid stenosis and a right subclavian stenosis. He previously had a cardiac catheterization in April 2016 that demonstrated mild to moderate diagonal, left circumflex and OM disease. Heart catheterization done via right radial artery also noted to have calcified stenosis involving the right innominate artery. He underwent carotid ultrasound and upper extremity arterial duplex which showed bilateral ICA stenosis 40-59%, there was also evidence of smoke proximal obstruction in the right upper extremity based on waveform dampening. He underwent CT angiogram that showed greater than 50% right subclavian artery stenosis. This was later evaluated by Dr. Fletcher Anon in November 2017 who recommended medical therapy as he was asymptomatic.   He presented to the hospital on 07/27/2016 with dyspnea and chest discomfort and found to have elevated troponin. Prior to cath, give a history of GI bleed, guaiac stool was obtained which was negative. Cardiac catheterization performed on 07/29/2016 showed an 80% hazy stenosis involving proximal OM1, 60% stable D1 stenosis, mild to moderate nonobstructive CAD involving LAD, left circumflex and RCA. His OM lesion was treated with a Synergy 2.25 x 38 mm DES postdilated to 2.5 mm. Postprocedure, he was placed on triple therapy including aspirin, Plavix and eliquis for one month, after which time aspirin can be discontinued. He had acute on chronic renal insufficiency with peak creatinine of 2.03 on 07/30/2016. His diuretic was held. He had echocardiogram obtained on 08/01/2016 that showed EF 36-64%, grade 2 diastolic dysfunction,  PA peak pressure 53 mmHg, moderate AS. His most recent hospitalization was complicated by infection of the left wrist with MSSA bacteremia as result of infection from IV placed by EMS. He also had diarrhea, C. difficile test positive however toxin negative. He was treated on IV cefazolin and IV vancomycin.   Since his discharge, he has been noticing progressive lower extremity edema, orthopnea or paroxysmal nocturnal dyspnea. He says he has been sleeping upright due to the shortness of breath every time he tried to lay down. Otherwise he has not been noticing any significant GI bleed. He eventually sought medical attention for shortness of breath on 08/10/2016. He was noted to have worsening anemia with hemoglobin down to 8.5 from the previous discharge hemoglobin of 10.8. Since admission, his hemoglobin has trended down to 7.7. He was also admitted for acute on chronic diastolic heart failure and underwent aggressive diuresis. His eliquis has since been held. Cardiology has been consulted for acute on chronic diastolic heart failure and recommendation regarding anticoagulation therapy.   Inpatient Medications  . allopurinol  200 mg Oral Daily  . amiodarone  200 mg Oral Daily  . aspirin  81 mg Oral Daily  . atorvastatin  40 mg Oral q1800  .  ceFAZolin (ANCEF) IV  2 g Intravenous Q8H  . clopidogrel  75 mg Oral Q breakfast  . diltiazem  180 mg Oral Daily  . fenofibrate  160 mg Oral Daily  . ferrous sulfate  325 mg Oral TID WC  . furosemide  40 mg Intravenous BID  . insulin aspart  0-15 Units Subcutaneous TID WC  . insulin aspart  0-5 Units Subcutaneous QHS  . insulin glargine  52 Units Subcutaneous Daily  . levothyroxine  150 mcg Oral QAC breakfast  . metoprolol tartrate  75 mg Oral BID  . pantoprazole  40 mg Oral Daily  . potassium chloride  20 mEq Oral Daily  . vancomycin  125 mg Oral Q6H    Family History Family History  Problem Relation Age of Onset  . Heart attack Father   . Lung  cancer Father   . Diabetes Father   . Stroke Father   . Hypertension Father   . Stroke Mother   . Hypertension Mother   . Cancer Brother     liver  . Heart disease Brother     chf  . COPD Sister   . Malignant hyperthermia Neg Hx      Social History Social History   Social History  . Marital status: Married    Spouse name: N/A  . Number of children: N/A  . Years of education: 32   Occupational History  .  Retired    Social History Main Topics  . Smoking status: Former Smoker    Packs/day: 0.70    Years: 48.00    Types: Cigarettes    Quit date: 03/29/2006  . Smokeless tobacco: Never Used  . Alcohol use 0.6 oz/week    1 Cans of beer per week     Comment: occasional alcohol intake  . Drug use: No  . Sexual activity: Not Currently   Other Topics Concern  . Not on file   Social History Narrative   Regular exercise-no   Caffeine Use-yes     Review of Systems  General:  No chills, fever, night sweats  +weight changes.  Cardiovascular:  No chest pain, palpitations +dyspnea on exertion, edema, orthopnea, paroxysmal nocturnal dyspnea. Dermatological: No rash, lesions/masses Respiratory: No cough +dyspnea Urologic: No hematuria, dysuria Abdominal:   No nausea, vomiting, diarrhea, bright red blood per rectum, melena, or hematemesis Neurologic:  No visual changes, wkns, changes in mental status. All other systems reviewed and are otherwise negative except as noted above.  Physical Exam  Blood pressure (!) 175/66, pulse 60, temperature 98 F (36.7 C), temperature source Oral, resp. rate 18, height 5\' 11"  (1.803 m), weight 267 lb 3.2 oz (121.2 kg), SpO2 99 %.  General: Pleasant, NAD Psych: Normal affect. Neuro: Alert and oriented X 3. Moves all extremities spontaneously. HEENT: Normal  Neck: Supple without bruits or JVD. Lungs:  Resp regular and unlabored. Decreased breath sound in bilateral bases.  Heart: RRR no s3, s4, or murmurs. Abdomen: Soft, non-tender,  non-distended, BS + x 4.  Extremities: No clubbing, cyanosis. DP/PT/Radials 2+ and equal bilaterally. 2+ edema. L wrist wound still draining  Labs  No results for input(s): CKTOTAL, CKMB, TROPONINI in the last 72 hours. Lab Results  Component Value Date   WBC 4.1 08/12/2016   HGB 7.7 (L) 08/12/2016   HCT  24.3 (L) 08/12/2016   MCV 91.4 08/12/2016   PLT 145 (L) 08/12/2016    Recent Labs Lab 08/10/16 1750  08/12/16 0452  NA 138  < > 138  K 4.5  < > 4.0  CL 104  < > 101  CO2 26  < > 29  BUN 19  < > 21*  CREATININE 1.49*  < > 1.75*  CALCIUM 8.4*  < > 8.5*  PROT 6.6  --   --   BILITOT 0.6  --   --   ALKPHOS 67  --   --   ALT 9*  --   --   AST 23  --   --   GLUCOSE 124*  < > 202*  < > = values in this interval not displayed. Lab Results  Component Value Date   CHOL 101 08/01/2016   HDL 22 (L) 08/01/2016   LDLCALC 10 08/01/2016   TRIG 344 (H) 08/01/2016   Lab Results  Component Value Date   DDIMER <0.19 05/08/2016    Radiology/Studies  Dg Chest 2 View  Result Date: 08/10/2016 CLINICAL DATA:  Shortness breath.  17 pound weight gain in 2 weeks. EXAM: CHEST  2 VIEW COMPARISON:  One-view chest x-ray 08/03/2016. FINDINGS: The heart is enlarged. Atherosclerotic calcifications are present in the aorta. A right-sided PICC line is stable. New bilateral pleural effusions and basilar airspace disease are present. Interstitial edema has increased. No other significant airspace consolidation is present. Vertebral body heights and alignment are maintained. IMPRESSION: 1. Cardiomegaly with increasing interstitial edema and bilateral pleural effusions compatible with congestive heart failure. 2. Aortic atherosclerosis. Electronically Signed   By: San Morelle M.D.   On: 08/10/2016 17:46   Dg Chest 2 View  Result Date: 07/27/2016 CLINICAL DATA:  Generalized chest pressure today.  Recent pneumonia. EXAM: CHEST  2 VIEW COMPARISON:  Chest radiograph July 07, 2016 FINDINGS:  Cardiomediastinal silhouette is normal. Mildly calcified aortic knob. Patchy RIGHT middle lobe airspace opacity. Linear densities project LEFT mid and lower lung zone. No pleural effusion. No pneumothorax. Soft tissue planes included osseous structures are nonsuspicious. IMPRESSION: Similar patchy RIGHT middle lobe and lingular/LEFT lung base atelectasis/scarring. Electronically Signed   By: Elon Alas M.D.   On: 07/27/2016 02:21   Dg Wrist Complete Left  Result Date: 07/30/2016 CLINICAL DATA:  Swelling and bulging wound along the central posterior wrist EXAM: LEFT WRIST - COMPLETE 3+ VIEW COMPARISON:  06/06/2008 FINDINGS: No acute fracture or dislocation. Moderate osteoarthritis of the radiocarpal joint. Mild osteoarthritis of the scaphotrapeziotrapezoid joint. Mild osteoarthritis of the first [place] joint. Moderate osteoarthritis of the first [place] joint. Periarticular erosions involving the first metacarpal head as can be seen with gout. Soft tissue swelling along the dorsal aspect the hand which may represent a small hematoma given the patient's history. Peripheral vascular atherosclerotic disease. IMPRESSION: 1.  No acute osseous injury of the left hand. 2. Periarticular erosions involving the first metacarpal head as can be seen with gout. Electronically Signed   By: Kathreen Devoid   On: 07/30/2016 09:39   Dg Chest Port 1 View  Result Date: 08/03/2016 CLINICAL DATA:  Evaluate central venous catheter placement EXAM: PORTABLE CHEST 1 VIEW COMPARISON:  08/03/2016 FINDINGS: There is a right arm PICC line with tip in the projection of the distal SVC. The heart size is moderately enlarged. No pleural effusion or edema. No airspace opacities. IMPRESSION: 1. Right arm PICC line tip projects over the distal SVC. Electronically Signed  By: Kerby Moors M.D.   On: 08/03/2016 16:42   Dg Chest Port 1 View  Result Date: 08/03/2016 CLINICAL DATA:  PICC line placement. EXAM: PORTABLE CHEST 1 VIEW COMPARISON:   07/30/2016 and 07/27/2016. FINDINGS: 1348 hours. Right arm PICC projects to the mid SVC level. The catheter appears to extend into the azygos vein, suboptimally evaluated on this single AP view. Stable cardiomegaly and aortic atherosclerosis. There is mild atelectasis at both lung bases. No confluent airspace opacity, pneumothorax or significant pleural effusion. IMPRESSION: Right arm PICC appears to extend into the azygos vein on this single portable AP examination. Catheter repositioning prior to use and radiographic follow up recommended. These results will be called to the ordering clinician or representative by the Radiologist Assistant, and communication documented in the PACS or zVision Dashboard. Electronically Signed   By: Richardean Sale M.D.   On: 08/03/2016 14:06   Dg Chest Port 1 View  Result Date: 07/30/2016 CLINICAL DATA:  Increasing shortness of Breath EXAM: PORTABLE CHEST 1 VIEW COMPARISON:  07/27/2016 FINDINGS: Cardiac shadow remains enlarged. The lungs are again well aerated without focal infiltrate. Very mild vascular congestion is seen without interstitial edema. No bony abnormality is noted. IMPRESSION: Mild vascular prominence without interstitial edema. Electronically Signed   By: Inez Catalina M.D.   On: 07/30/2016 13:58   US Abdomen Limited Ruq  Result Date: 07/27/2016 CLINICAL DATA:  Right upper quadrant pain and chest pain, 4 hours duration. EXAM: US ABDOMEN LIMITED - RIGHT UPPER QUADRANT COMPARISON:  None. FINDINGS: Gallbladder: Multiple mobile calculi, measuring up to 1.2 cm. No gallbladder wall thickening or pericholecystic fluid. The patient was not tender over the gallbladder. Common bile duct: Diameter: 3 mm Liver: Mild coarsening of hepatic echotexture without discrete lesion. IMPRESSION: Cholelithiasis without sonographic evidence of acute cholecystitis. Electronically Signed   By: Andreas Newport M.D.   On: 07/27/2016 03:55    ECG  Normal sinus rhythm without  significant ST-T wave changes   ASSESSMENT AND PLAN  1. Acute on chronic diastolic heart failure: Still appears to be fluid overloaded on physical exam, at least 2+ pitting edema in bilateral lower extremity, will continue Lasix 40 mg twice a day. Noted his creatinine is trending up, we will continue monitoring his creatinine. Baseline creatinine 1.4-1.5, today creatinine was 1.7.  - Recent echocardiogram showed normal EF. Likely precipitating factor for acute heart failure was recent renal injury and holding of diuretic from the previous admission. However since that the current admission, he has been diuresed at least 6 L. He likely will reach euvolemic level in 1 to 2 days.  2. Recurrent GI bleed, history of AVM: We'll see further recommendation by GI team. His eliquis was stopped yesterday. Given the fresh stent, he will need aspirin and Plavix for at least 3 months.   3. PAF on amiodarone, eliquis stopped yesterday. Currently maintaining sinus rhythm. Unless recurrent atrial fibrillation, likely can to need to hold eliquis for several weeks.  4. stage III CKD: Baseline creatinine 1.4-1.5, current creatinine 1.7. We'll continue to monitor while on IV diuresis  5. Moderate AS: 3/6 systolic murmur noted at right upper sternal border. No treatment needed at this time.  6. HTN: Continue metoprolol and diltiazem. Elevated blood pressure, may consider up titrating metoprolol 200 mg twice a day later if has any recurrence of atrial fibrillation.   7. HLD: On Lipitor 40 mg daily  8. Recent MSSA bacteremia and infected left wrist: On vancomycin and cefazolin during the last  admission, also on this admission as well.   Signed, Almyra Deforest, PA-C 08/12/2016, 12:36 PM  Agree with note by Almyra Deforest PA-C  Difficult situation. Recent DES to LCX OM 2 weeks ago. Nl LV FXN with G2DD and mild-mod AS. H/O GIB in past with AVMs documented by pill endoscopy as well as recent polypectomy in Dec Michael [name]). Pt  just readmitted with volume overload requiring diuresis (6 L so far) with mild increase in SCR. He also has H/O PAF maintaining NSR on amio and Eliquis. His Hgb has been drifting down now in the mid 7 range (was 12 several weeks ago) not surprising given the fact that he was on triple Rx. Eliquis has been appropriately held. GI consulted. He CAN NOT interrupt his DAPT 2 weeks after a DES. Would Tx PRBCs for HGB < 7.5. Hold Eliuis for now and follow HGB and SCr closely. Pt feels clinically improved.    Lorretta Harp, M.D., [place], [facility], Laverta [place] M[place] [address] [place], [place]  [address]  [phone] 08/12/2016 1:38 PM

## 2016-08-12 NOTE — Progress Notes (Signed)
Blood transfusion completed at 1900. No reaction suspected. CBC ordered 2 hours post transfusion to check H/H.   Michael [name]

## 2016-08-12 NOTE — Consult Note (Signed)
[facility] Consult: 11:17 AM 08/12/2016  LOS: 2 days    Referring Provider: Dr Tana Coast  Primary Care Physician:  [name], MD Primary Gastroenterologist:  Dr.[name].       Reason for Consultation:  Acute on chronic anemia, FOBT + stool in setting of ASA/Plavix/Coumadin.     HPI: Michael [name] is a 76 y.o. male.  PMH Hypothyroidism.  Psoriasis on Humira.  Type 2, insulin requiring, DM.  CKD stage 3.   History of persistent A. fib, on chronic Eliquis.  CAD.  Carotid artery dz.  Diastolic CHF.  Thrombocytopenia dating to 04/2016 ([id number]).  Lap appy 2015.   Hx + toxingenic C diff 02/2016, 04/2016, 07/2016.    Hx IDA attributed in part to small bowel AVMs.  On chronic po Iron.  40 mg Omeprazole daily.  At least a year ago aspirin was discontinued because of GI bleeding. History of colon polyps 2007, 2008. No recurrent polyps on colonoscopy in 01/2011. 11/2011 EGD. Normal to D3.  12/2011 Capsule endo: Non-bleeding small bowel AVMs.   05/13/2016 colonoscopy to evaluate unexplained GI bleeding in the setting of alcohol a course.  Dr. Havery Moros found nonthrombosed external hemorrhoids.  3 polyps in the ascending colon, the one in the distal ascending colon was large. Polyps in the sigmoid colon.  None of the polyps were removed for biopsy given that the patient was on Eliquis.  Plan was for future colonoscopy/polypectomy off Eliquis. 06/25/2016 Colonoscopy.  Dr [name] removed the 18 mm ascending colon polyp and a smaller polyp in the cecum.  2 clips placed to site of larger polypectomy for bleeding prophylaxis. MD did not see additional polyps in the ascending or sigmoid colons. There was no colitis.  Pathology on both polyps: tubular adenomas  07/27/16 - 08/04/16 admission with IV associated septic thrombophlebitis  left wrist and MSSA bacteremia.  Plan for Cefazolin via PICC until at least the 08/18/16 ID office follow up.  C diff + treated with oral Vanc, to continue 1 week post completion of Cefazolin.  Ruled in for non STEMI. Cardiac Cath 07/29/16 with DES placed to severely stenosed OM1.  Plavix, 81 ASA added.  After one month consider d/c of ASA.  ARB Valsartan and Lasix stopped due to worsening renal failure. TEE showed grade 2 diastolic dysfx. EF 60 - 65%.   08/10/16 admission with weight gain, 10 # since discharge. Increasing LE edema, DOE/SOB, new onset orthopnea.  Hypertensive in PMD office and referred to ED.   BNP elevated Hgb 8.5.. 8.1.. 7.7.  Was 12.3 - 10.8 during admission 2 weeks ago.  He is FOBT +   Patient hasn't had any GI issues of late. Despite the recent C. difficile positive testing, patient hasn't had any diarrhea for many weeks. He does take oral iron 3 times a day and his stools tend to be dark but he never sees blood. No constipation. No abdominal pain. No nausea, vomiting. No dysphagia. No nose bleeds or excessive bleeding/bruising. No hematuria.  No NSAIDs. Just before his recent admission his weight was 161#,  just after he returned home his weight was 278#.  At current admission his weight was 275#.  With Lasix diuresis, patient has dropped 8# within the last 24 hours and his breathing/swelling has improved. Just before recent admission the dose of Lasix had been increased from 20 mg to 40 mg and then it was stopped because of the renal issue.     Past Medical History:  Diagnosis Date  . Acute on chronic heart failure ([place])   . Allergy   . Arthritis   . AVM (arteriovenous malformation) of colon   . C. difficile enteritis   . CAD (coronary artery disease)    a. Cath 10/2014 - Mild to moderate diagonal, circumflex and obtuse marginal disease. Innominate artery sternosis.  . Carotid artery disease ([place])    a. Carotid dopple 03/2014 93-23% RICA &  55-73% LICA stenosis b. 2/20  . CKD  (chronic kidney disease), stage III   . Clostridium difficile colitis 05/13/2016  . Dyspnea   . Dysrhythmia    ATRIAL FIBRILATION  . Elevated troponin    a. 09/2014 in setting of AF RVR-->Myoview: EF 49%, no ischemia/infarct->Med Rx.  . GERD (gastroesophageal reflux disease)   . Gout   . H/O transfusion of whole blood   . Heart murmur   . History of PFTs    PFTs 6/16:  FVC 3.73 (87%), FEV1 2.93 (88%), FEV1/FVC 78%, DLCO 69%  . Hx of adenomatous colonic polyps 07/02/2016  . Hyperlipidemia   . Hypertension   . Hypothyroidism   . Iron deficiency anemia   . PAF (paroxysmal atrial fibrillation) ([place])    a. 09/2014: Converted on Dilt;  b. CHA2DS2VASc = 3-->eliquis;  c. 09/2014 Echo: EF 55-60%, mild LVH, mildly dil LA.  [name] Personal history of colonic polyps 2007, 2008   adenoma each time, largest 12 mm in 2007  . Psoriasis   . Staph infection 07/2016   left hand   . Type II diabetes mellitus ([place])    TYPE 2    Past Surgical History:  Procedure Laterality Date  . APPENDECTOMY  02/09/2014  . CARDIAC CATHETERIZATION N/A 07/29/2016   Procedure: Right/Left Heart Cath and Coronary Angiography;  Surgeon: Nelva Bush, MD;  Location: [facility];  Service: Cardiovascular;  Laterality: N/A;  . CARDIAC CATHETERIZATION N/A 07/29/2016   Procedure: Coronary Stent Intervention;  Surgeon: Nelva Bush, MD;  Location: [facility];  Service: Cardiovascular;  Laterality: N/A;  . COLONOSCOPY  02/10/2011   internal hemorrhoids  . COLONOSCOPY W/ POLYPECTOMY  04/09/2006   12 mm adenoma  . COLONOSCOPY W/ POLYPECTOMY  06/17/2007   5 mm adenoma  . COLONOSCOPY WITH PROPOFOL N/A 05/13/2016   Procedure: COLONOSCOPY WITH PROPOFOL;  Surgeon: Manus Gunning, MD;  Location: [facility];  Service: Gastroenterology;  Laterality: N/A;  . CORONARY STENT PLACEMENT  07/29/2016   OMI    DES  . ESOPHAGOGASTRODUODENOSCOPY  12/23/2011   Procedure: ESOPHAGOGASTRODUODENOSCOPY (EGD);  Surgeon: Irene Shipper, MD;  Location: [facility];  Service: Endoscopy;  Laterality: N/A;  with small bowel bx's  . GIVENS CAPSULE STUDY  12/28/2011  . KNEE ARTHROSCOPY Right   . LAPAROSCOPIC APPENDECTOMY N/A 02/09/2014   Procedure: APPENDECTOMY LAPAROSCOPIC;  Surgeon: Leighton Ruff, MD;  Location: [facility];  Service: General;  Laterality: N/A;  . LEFT HEART CATHETERIZATION WITH CORONARY ANGIOGRAM N/A 11/15/2014   Procedure: LEFT HEART CATHETERIZATION WITH CORONARY ANGIOGRAM;  Surgeon: Belva Crome, MD;  Location: [facility];  Service: Cardiovascular;  Laterality: N/A;  . ROTATOR CUFF REPAIR     left    Prior to Admission medications   Medication Sig Start Date End Date Taking? Authorizing Provider  acetaminophen (TYLENOL) 325 MG tablet Take 2 tablets (650 mg total) by mouth every 4 (four) hours as needed for mild pain, fever or headache. 08/04/16  Yes Velvet Bathe, MD  allopurinol (ZYLOPRIM) 100 MG tablet Take 2 tablets (200 mg total) by mouth daily. 02/03/16  Yes Evie Lacks [name], MD  amiodarone (PACERONE) 200 MG tablet TAKE 1 TABLET BY MOUTH DAILY 06/24/16  Yes Wellington Hampshire, MD  apixaban (ELIQUIS) 5 MG TABS tablet Take 1 tablet (5 mg total) by mouth 2 (two) times daily. 06/02/16  Yes Wellington Hampshire, MD  aspirin 81 MG chewable tablet Chew 1 tablet (81 mg total) by mouth daily. 08/05/16  Yes Velvet Bathe, MD  atorvastatin (LIPITOR) 40 MG tablet Take 1 tablet (40 mg total) by mouth daily. 03/09/16  Yes Josue Hector, MD  ceFAZolin (ANCEF) IVPB Inject 2 g into the vein every 8 (eight) hours. Indication:  Left wrist septic arthritis with MSSA bacteremia Last Day of Therapy:  08/18/16 Labs - Once weekly:  CBC/D and BMP, Labs - Every other week:  ESR and CRP 08/04/16 08/19/16 Yes Velvet Bathe, MD  Cholecalciferol 1000 UNITS TBDP Take 1,000-5,000 Units by mouth daily. Takes 1000 units everyday except for on Saturday patient takes 5000 units   Yes Historical Provider, MD  clopidogrel (PLAVIX) 75 MG tablet Take 1  tablet (75 mg total) by mouth daily with breakfast. 08/05/16  Yes Velvet Bathe, MD  diltiazem (CARDIZEM CD) 180 MG 24 hr capsule Take 1 capsule (180 mg total) by mouth daily. 12/16/15  Yes Wellington Hampshire, MD  fenofibrate 160 MG tablet TAKE 1 TABLET(160 MG) BY MOUTH DAILY 08/07/16  Yes Wellington Hampshire, MD  ferrous sulfate 325 (65 FE) MG tablet Take 325 mg by mouth 3 (three) times daily with meals.   Yes Historical Provider, MD  glipiZIDE (GLUCOTROL) 10 MG tablet Take 10 mg by mouth daily. 05/06/16  Yes Historical Provider, MD  HYDROcodone-acetaminophen (NORCO/VICODIN) 5-325 MG tablet Take 1 tablet by mouth 2 (two) times daily as needed for moderate pain or severe pain. 12/16/15  Yes Evie Lacks [name], MD  Insulin Glargine (TOUJEO SOLOSTAR) 300 UNIT/ML SOPN Inject 50 Units into the skin daily. Titrate up by 1 unit every 1-2 days for goal sugars of 120-150 Patient taking differently: Inject 52 Units into the skin daily.  04/17/16 04/17/20 Yes Evie Lacks [name], MD  levothyroxine (SYNTHROID, LEVOTHROID) 150 MCG tablet TAKE 1 TABLET BY MOUTH DAILY BEFORE BREAKFAST 03/16/16  Yes Cassandria Anger, MD  meclizine (ANTIVERT) 12.5 MG tablet Take 1 tablet (12.5 mg total) by mouth 2 (two) times daily as needed for dizziness (do not use for more than 3days). 07/07/16  Yes Charlene Brooke Nche, NP  metoprolol 75 MG TABS Take 75 mg by mouth 2 (two) times daily. 08/04/16  Yes Velvet Bathe, MD  omeprazole (PRILOSEC) 40 MG capsule TAKE 1 CAPSULE BY MOUTH EVERY DAY 05/04/16  Yes Evie Lacks [name], MD  vancomycin (VANCOCIN) 50 mg/mL oral solution Take 2.5 mLs (125 mg total) by mouth every 6 (six) hours. 08/04/16 08/25/16 Yes Velvet Bathe, MD  Blood Glucose Monitoring Suppl (ONE TOUCH ULTRA 2) W/DEVICE KIT Use as directed Dx E11.9 05/18/14   Evie Lacks [name], MD  Blood Pressure Monitor KIT Use to check blood pressure daily Dx I10 10/04/15  Evie Lacks [name], MD  glucose blood (ONE TOUCH ULTRA TEST) test strip 1 each by  Other route 4 (four) times daily. Use to check blood sugar four times a day  Dx: E11.9- insulin Dependant 04/06/16   Evie Lacks [name], MD  Insulin Pen Needle 31G X 5 MM MISC 1 Units by Does not apply route 4 (four) times daily. 03/17/16   Cassandria Anger, MD  Lancets [facility] ULTRASOFT) lancets Use to check blood sugars daily 05/18/14   Cassandria Anger, MD    Scheduled Meds: . sodium chloride   Intravenous Once  . allopurinol  200 mg Oral Daily  . amiodarone  200 mg Oral Daily  . aspirin  81 mg Oral Daily  . atorvastatin  40 mg Oral q1800  .  ceFAZolin (ANCEF) IV  2 g Intravenous Q8H  . clopidogrel  75 mg Oral Q breakfast  . diltiazem  180 mg Oral Daily  . fenofibrate  160 mg Oral Daily  . ferrous sulfate  325 mg Oral TID WC  . furosemide  40 mg Intravenous BID  . insulin aspart  0-15 Units Subcutaneous TID WC  . insulin aspart  0-5 Units Subcutaneous QHS  . insulin glargine  52 Units Subcutaneous Daily  . levothyroxine  150 mcg Oral QAC breakfast  . metoprolol tartrate  75 mg Oral BID  . pantoprazole  40 mg Oral Daily  . potassium chloride  20 mEq Oral Daily  . vancomycin  125 mg Oral Q6H   Infusions:  PRN Meds: HYDROcodone-acetaminophen, ondansetron **OR** ondansetron (ZOFRAN) IV, sodium chloride flush   Allergies as of 08/10/2016 - Review Complete 08/10/2016  Allergen Reaction Noted  . Percocet [oxycodone-acetaminophen] Nausea And Vomiting 01/27/2011  . Invokana [canagliflozin]  03/17/2016  . Metformin and related  03/17/2016    Family History  Problem Relation Age of Onset  . Heart attack Father   . Lung cancer Father   . Diabetes Father   . Stroke Father   . Hypertension Father   . Stroke Mother   . Hypertension Mother   . Cancer Brother     liver  . Heart disease Brother     chf  . COPD Sister   . Malignant hyperthermia Neg Hx     Social History   Social History  . Marital status: Married    Spouse name: N/A  . Number of children: N/A  .  Years of education: 32   Occupational History  .  Retired    Social History Main Topics  . Smoking status: Former Smoker    Packs/day: 0.70    Years: 48.00    Types: Cigarettes    Quit date: 03/29/2006  . Smokeless tobacco: Never Used  . Alcohol use 0.6 oz/week    1 Cans of beer per week     Comment: occasional alcohol intake  . Drug use: No  . Sexual activity: Not Currently   Other Topics Concern  . Not on file   Social History Narrative   Regular exercise-no   Caffeine Use-yes    REVIEW OF SYSTEMS: Constitutional:  Per HPI ENT:  No nose bleeds Pulm:  Dyspnea and improvement as above. CV:  No palpitations, no chest pain. Lower extremity edema improved. GU:  No hematuria, no frequency GI:  Per HPI Heme:  Per HPI   Transfusions:  In 2013 transfused PRBC 2. Neuro:  No headaches, no peripheral tingling or numbness Derm:  No itching, no rash or sores.  Endocrine:  No sweats or chills.  No polyuria or dysuria Immunization:  Did not inquire as to recent or previous immunizations. Travel:  None beyond local counties in last few months.    PHYSICAL EXAM: Vital signs in last 24 hours: Vitals:   08/12/16 0343 08/12/16 0805  BP: (!) 145/54 (!) 153/58  Pulse: (!) 56 (!) 56  Resp: 18 18  Temp: 97.8 F (36.6 C) 97.8 F (36.6 C)   Wt Readings from Last 3 Encounters:  08/12/16 121.2 kg (267 lb 3.2 oz)  08/04/16 123.5 kg (272 lb 4.3 oz)  07/07/16 117.9 kg (260 lb)    General: Pleasant, obese, non-ill appearing WF. He is comfortable and fully alert Head:  No facial asymmetry or swelling. No signs of head trauma.  Eyes:  No conjunctival pallor. No scleral icterus Ears:  Slightly HOH  Nose:  No discharge or congestion Mouth:  Moist, clear oral MM. Tongue midline. Neck:  No JVD, no masses, no TMG. Lungs:  Clear bilaterally. No dyspnea or cough. Heart: RRR. No MRG. S1, S2 present. On telemetry has NSR with rate in the 60s. Abdomen:  Soft without masses. Nontender. Active  bowel sounds. No HSM or hernias..   Rectal: Did not perform rectal exam.   Musc/Skeltl: Arthritic deformities in his fingers which are quite normally. Extremities:  1-2+ lower extremity edema.  Neurologic:  Patient is fully alert. Oriented 3. Good historian. Speech is fluid. This all 4 limbs, strength not tested. No tremor. Skin:  No rashes. Healing sore on the floor surface of left wrist. Tattoos:  None observed Nodes:  No cervical adenopathy.   Psych:  Pleasant, cooperative. Not depressed.  Intake/Output from previous day: 01/16 0701 - 01/17 0700 In: 6256 [P.O.:1260; IV Piggyback:217] Out: 4600 [Urine:4600] Intake/Output this shift: Total I/O In: 130 [P.O.:120; I.V.:10] Out: 1000 [Urine:1000]  LAB RESULTS:  Recent Labs  08/10/16 1750 08/11/16 0309 08/12/16 0744  WBC 6.5 5.3 4.1  HGB 8.5* 8.1* 7.7*  HCT 27.1* 25.8* 24.3*  PLT 191 172 145*   BMET Lab Results  Component Value Date   NA 138 08/12/2016   NA 139 08/11/2016   NA 138 08/10/2016   K 4.0 08/12/2016   K 4.0 08/11/2016   K 4.5 08/10/2016   CL 101 08/12/2016   CL 103 08/11/2016   CL 104 08/10/2016   CO2 29 08/12/2016   CO2 28 08/11/2016   CO2 26 08/10/2016   GLUCOSE 202 (H) 08/12/2016   GLUCOSE 97 08/11/2016   GLUCOSE 124 (H) 08/10/2016   BUN 21 (H) 08/12/2016   BUN 18 08/11/2016   BUN 19 08/10/2016   CREATININE 1.75 (H) 08/12/2016   CREATININE 1.52 (H) 08/11/2016   CREATININE 1.49 (H) 08/10/2016   CALCIUM 8.5 (L) 08/12/2016   CALCIUM 8.4 (L) 08/11/2016   CALCIUM 8.4 (L) 08/10/2016   LFT  Recent Labs  08/10/16 1750  PROT 6.6  ALBUMIN 2.7*  AST 23  ALT 9*  ALKPHOS 67  BILITOT 0.6   PT/INR Lab Results  Component Value Date   INR 1.01 07/29/2016   INR 1.62 03/17/2016   INR 1.17 01/20/2015   Hepatitis Panel No results for input(s): HEPBSAG, HCVAB, HEPAIGM, HEPBIGM in the last 72 hours. C-Diff No components found for: CDIFF Lipase     Component Value Date/Time   LIPASE 43  07/27/2016 0156    Drugs of Abuse  No results found for: LABOPIA, COCAINSCRNUR, LABBENZ, AMPHETMU, THCU, LABBARB   RADIOLOGY STUDIES: Dg Chest 2 View  Result Date: 08/10/2016 CLINICAL DATA:  Shortness breath.  17 pound weight gain in 2 weeks. EXAM: CHEST  2 VIEW COMPARISON:  One-view chest x-ray 08/03/2016. FINDINGS: The heart is enlarged. Atherosclerotic calcifications are present in the aorta. A right-sided PICC line is stable. New bilateral pleural effusions and basilar airspace disease are present. Interstitial edema has increased. No other significant airspace consolidation is present. Vertebral body heights and alignment are maintained. IMPRESSION: 1. Cardiomegaly with increasing interstitial edema and bilateral pleural effusions compatible with congestive heart failure. 2. Aortic atherosclerosis. Electronically Signed   By: San Morelle M.D.   On: 08/10/2016 17:46     IMPRESSION:   *  Acute on chronic anemia, FOBT + in setting of ASA, Eliquis, Plavix.  FOBT + attributed to SB AVMs in past. Received infusion of parenteral iron yesterday, continues on home dose of TID po iron. Eliquis currently discontinued but still on 81 ASA and Plavix.    *  Adenomatous colon polyps, recurrent.  Polypectomy 06/25/16   *  Positive Toxigenic C diff PCR.  On PO Vancomycin for last 2.5 weeks though no clinical diarrhea for months.    *  Chronic Eliquis for A fib  *  Newer addition Plavix, 81 ASA after non-STEMI & cath/DES 07/29/16  *  C diff +.  On oral vancomycin per ID to continue 1 week beyond completion of Cefazolin.   *  MSSA bacteremia.  On Cefazolin through at least 1/23 per ID   PLAN:     *  Dr Havery Moros to follow.  Can decide about pursuing Enteroscopy but Plavix makes therapeutic intervention to any encountered AVMs problematic.  *  Continue po daily Protonix.   *  Follow hgb.     Azucena Freed  08/12/2016, 11:17 AM Pager: [phone]

## 2016-08-12 NOTE — Progress Notes (Signed)
[facility]                                                                              Patient Demographics  Michael [name], is a 76 y.o. male, DOB - 1941/03/11, SRP:[id number]  Admit date - 08/10/2016   Admitting Physician Edwin Dada, MD  Outpatient Primary MD for the patient is [name], MD  Outpatient specialists:   LOS - 2  days    Chief Complaint  Patient presents with  . Shortness of Breath  . Wound Infection       Brief summary   Michael Diel Jonesis a 76 y.o.malewith a past medical history significant for HFpEF, psoriasis on Humira, CKD III baseline Cr 1.4, IDDM, iron def anemia, pAF on apixaban, hypothyoridism, HTN, and CAD with recent DES to [facility] presents with 1 week swelling and now dyspnea/orthopnea. Admitted with CHF exacerbation started on IV lasix     Assessment & Plan    Principal Problem:   Acute on chronic diastolic CHF (congestive heart failure) (HCC) - 2-D echo 1/6 showed EF of 60-65% with grade 2 diastolic dysfunction, PA pressure 53 - Per patient during previous hospitalization, due to AK I, creatinine worsened to 2.0 and hence Lasix was held, gained 17 lbs during his hospitalization. Per patient normal dry weight of ~240lbs, on 1/1 was 260lbs  - Placed on IV diuresis, net negative balance of 6.1 L, weight down from 275 lbs -> 267lbs  - Still has peripheral edema, creatinine has started to trend up - Consulted cardiology, continue IV diuresis today - Hold Avapro  Active Problems: Paroxysmal atrial fibrillation - CHADSVASc 5 on Eliquis  - On triple therapy due to recent stent, on aspirin, Plavix, and  - Will hold Eliquis given anemia and + FOBT  - Hemoglobin down to 7.7 - Will transfuse 1 unit, consulted gastroenterology, will also await cardiology recommendations regarding antiplatelet agents, currently eliquis on hold - Continue diltiazem, amiodarone and BB  Acute on Chronic iron deficiency anemia    Hemoglobin down to 7.7, transfuse 1 unit, see above  CAD -  -s/p DES on 1/3 on triple therapy for 1 month - then Plavix and Eliquis - asymptomatic  -- Continue ASA/Plavix for now, until further cardiology recommendations - Continue BB and ARB   MSSA Bacteremia  - Continue Cefazolin   History of C. difficile colitis - Continue oral vancomycin  CKD stage III - Cr at baseline  - Creatinine has trended up with IV diuresis, follow closely , will continue IV diuresis today -  may need to transition to oral Lasix in a.m.   Psoariasis - stable Previously on Humira - last dose on 06/2016  Code Status: FULL DVT Prophylaxis:  SCD's Family Communication: Discussed in detail with the patient, all imaging results, lab results explained to the patient   Disposition Plan:   Time Spent in minutes   25 minutes  Procedures:  None   Consultants:   Cardiology GI  Antimicrobials:   Cefazolin  Oral vancomycin   Medications  Scheduled Meds: . allopurinol  200 mg Oral Daily  . amiodarone  200 mg Oral Daily  .  aspirin  81 mg Oral Daily  . atorvastatin  40 mg Oral q1800  .  ceFAZolin (ANCEF) IV  2 g Intravenous Q8H  . clopidogrel  75 mg Oral Q breakfast  . diltiazem  180 mg Oral Daily  . fenofibrate  160 mg Oral Daily  . ferrous sulfate  325 mg Oral TID WC  . furosemide  40 mg Intravenous BID  . insulin aspart  0-15 Units Subcutaneous TID WC  . insulin aspart  0-5 Units Subcutaneous QHS  . insulin glargine  52 Units Subcutaneous Daily  . irbesartan  150 mg Oral Daily  . levothyroxine  150 mcg Oral QAC breakfast  . metoprolol tartrate  75 mg Oral BID  . pantoprazole  40 mg Oral Daily  . potassium chloride  20 mEq Oral Daily  . vancomycin  125 mg Oral Q6H   Continuous Infusions: PRN Meds:.HYDROcodone-acetaminophen, ondansetron **OR** ondansetron (ZOFRAN) IV, sodium chloride flush   Antibiotics   Anti-infectives    Start     Dose/Rate Route Frequency Ordered Stop    08/11/16 1400  ceFAZolin (ANCEF) IVPB 2g/100 mL premix     2 g 200 mL/hr over 30 Minutes Intravenous Every 8 hours 08/11/16 0827     08/11/16 0000  vancomycin (VANCOCIN) 50 mg/mL oral solution 125 mg     125 mg Oral Every 6 hours 08/10/16 2304     08/10/16 2315  ceFAZolin (ANCEF) IVPB  Status:  Discontinued    Comments:  Indication:  Left wrist septic arthritis with MSSA bacteremia Last Day of Therapy:  08/18/16 Labs - Once weekly:  CBC/D and BMP, Labs - Every other week:  ESR and CRP     2 g Intravenous Every 8 hours 08/10/16 2304 08/10/16 2312   08/10/16 2315  ceFAZolin (ANCEF) IVPB 1 g/50 mL premix  Status:  Discontinued     1 g 100 mL/hr over 30 Minutes Intravenous Every 8 hours 08/10/16 2313 08/11/16 0827        Subjective:   Michael [name] was seen and examined today. Patient denies dizziness, chest pain, shortness of breath, abdominal pain, N/V/D/C, new weakness, numbess, tingling. No acute events overnight.    Objective:   Vitals:   08/11/16 1954 08/12/16 0121 08/12/16 0343 08/12/16 0805  BP: (!) 151/60 (!) 155/54 (!) 145/54 (!) 153/58  Pulse: (!) 58 (!) 55 (!) 56 (!) 56  Resp: 20 18 18 18   Temp: 98 F (36.7 C) 97.8 F (36.6 C) 97.8 F (36.6 C) 97.8 F (36.6 C)  TempSrc: Oral Oral Oral Oral  SpO2: 98% 95% 96% 97%  Weight:   121.2 kg (267 lb 3.2 oz)   Height:        Intake/Output Summary (Last 24 hours) at 08/12/16 1039 Last data filed at 08/12/16 0917  Gross per 24 hour  Intake             1367 ml  Output             4600 ml  Net            -3233 ml     Wt Readings from Last 3 Encounters:  08/12/16 121.2 kg (267 lb 3.2 oz)  08/04/16 123.5 kg (272 lb 4.3 oz)  07/07/16 117.9 kg (260 lb)     Exam  General: Alert and oriented x 3, NAD  HEENT:  PERRLA, EOMI, Anicteric Sclera, mucous membranes moist.   Neck: Supple, no JVD,   Cardiovascular: S1 S2 auscultated, Irregularly irregular  Respiratory: Decreased breath sound at the bases  Gastrointestinal:  Soft, nontender, nondistended, + bowel sounds  Ext: no cyanosis clubbing, 2+ pitting edema  Neuro: AAOx3, Cr N's II- XII. Strength 5/5 upper and lower extremities bilaterally  Skin: No rashes  Psych: Normal affect and demeanor, alert and oriented x3    Data Reviewed:  I have personally reviewed following labs and imaging studies  Micro Results No results found for this or any previous visit (from the past 240 hour(s)).  Radiology Reports Dg Chest 2 View  Result Date: 08/10/2016 CLINICAL DATA:  Shortness breath.  17 pound weight gain in 2 weeks. EXAM: CHEST  2 VIEW COMPARISON:  One-view chest x-ray 08/03/2016. FINDINGS: The heart is enlarged. Atherosclerotic calcifications are present in the aorta. A right-sided PICC line is stable. New bilateral pleural effusions and basilar airspace disease are present. Interstitial edema has increased. No other significant airspace consolidation is present. Vertebral body heights and alignment are maintained. IMPRESSION: 1. Cardiomegaly with increasing interstitial edema and bilateral pleural effusions compatible with congestive heart failure. 2. Aortic atherosclerosis. Electronically Signed   By: San Morelle M.D.   On: 08/10/2016 17:46   Dg Chest 2 View  Result Date: 07/27/2016 CLINICAL DATA:  Generalized chest pressure today.  Recent pneumonia. EXAM: CHEST  2 VIEW COMPARISON:  Chest radiograph July 07, 2016 FINDINGS: Cardiomediastinal silhouette is normal. Mildly calcified aortic knob. Patchy RIGHT middle lobe airspace opacity. Linear densities project LEFT mid and lower lung zone. No pleural effusion. No pneumothorax. Soft tissue planes included osseous structures are nonsuspicious. IMPRESSION: Similar patchy RIGHT middle lobe and lingular/LEFT lung base atelectasis/scarring. Electronically Signed   By: Elon Alas M.D.   On: 07/27/2016 02:21   Dg Wrist Complete Left  Result Date: 07/30/2016 CLINICAL DATA:  Swelling and bulging wound  along the central posterior wrist EXAM: LEFT WRIST - COMPLETE 3+ VIEW COMPARISON:  06/06/2008 FINDINGS: No acute fracture or dislocation. Moderate osteoarthritis of the radiocarpal joint. Mild osteoarthritis of the scaphotrapeziotrapezoid joint. Mild osteoarthritis of the first [place] joint. Moderate osteoarthritis of the first [place] joint. Periarticular erosions involving the first metacarpal head as can be seen with gout. Soft tissue swelling along the dorsal aspect the hand which may represent a small hematoma given the patient's history. Peripheral vascular atherosclerotic disease. IMPRESSION: 1.  No acute osseous injury of the left hand. 2. Periarticular erosions involving the first metacarpal head as can be seen with gout. Electronically Signed   By: Kathreen Devoid   On: 07/30/2016 09:39   Dg Chest Port 1 View  Result Date: 08/03/2016 CLINICAL DATA:  Evaluate central venous catheter placement EXAM: PORTABLE CHEST 1 VIEW COMPARISON:  08/03/2016 FINDINGS: There is a right arm PICC line with tip in the projection of the distal SVC. The heart size is moderately enlarged. No pleural effusion or edema. No airspace opacities. IMPRESSION: 1. Right arm PICC line tip projects over the distal SVC. Electronically Signed   By: Kerby Moors M.D.   On: 08/03/2016 16:42   Dg Chest Port 1 View  Result Date: 08/03/2016 CLINICAL DATA:  PICC line placement. EXAM: PORTABLE CHEST 1 VIEW COMPARISON:  07/30/2016 and 07/27/2016. FINDINGS: 1348 hours. Right arm PICC projects to the mid SVC level. The catheter appears to extend into the azygos vein, suboptimally evaluated on this single AP view. Stable cardiomegaly and aortic atherosclerosis. There is mild atelectasis at both lung bases. No confluent airspace opacity, pneumothorax or significant pleural effusion. IMPRESSION: Right arm PICC appears to extend into  the azygos vein on this single portable AP examination. Catheter repositioning prior to use and radiographic follow up  recommended. These results will be called to the ordering clinician or representative by the Radiologist Assistant, and communication documented in the PACS or zVision Dashboard. Electronically Signed   By: Richardean Sale M.D.   On: 08/03/2016 14:06   Dg Chest Port 1 View  Result Date: 07/30/2016 CLINICAL DATA:  Increasing shortness of Breath EXAM: PORTABLE CHEST 1 VIEW COMPARISON:  07/27/2016 FINDINGS: Cardiac shadow remains enlarged. The lungs are again well aerated without focal infiltrate. Very mild vascular congestion is seen without interstitial edema. No bony abnormality is noted. IMPRESSION: Mild vascular prominence without interstitial edema. Electronically Signed   By: Inez Catalina M.D.   On: 07/30/2016 13:58   US Abdomen Limited Ruq  Result Date: 07/27/2016 CLINICAL DATA:  Right upper quadrant pain and chest pain, 4 hours duration. EXAM: US ABDOMEN LIMITED - RIGHT UPPER QUADRANT COMPARISON:  None. FINDINGS: Gallbladder: Multiple mobile calculi, measuring up to 1.2 cm. No gallbladder wall thickening or pericholecystic fluid. The patient was not tender over the gallbladder. Common bile duct: Diameter: 3 mm Liver: Mild coarsening of hepatic echotexture without discrete lesion. IMPRESSION: Cholelithiasis without sonographic evidence of acute cholecystitis. Electronically Signed   By: Andreas Newport M.D.   On: 07/27/2016 03:55    Lab Data:  CBC:  Recent Labs Lab 08/10/16 1750 08/11/16 0309 08/12/16 0744  WBC 6.5 5.3 4.1  NEUTROABS 5.4  --   --   HGB 8.5* 8.1* 7.7*  HCT 27.1* 25.8* 24.3*  MCV 91.6 91.5 91.4  PLT 191 172 226*   Basic Metabolic Panel:  Recent Labs Lab 08/10/16 1750 08/11/16 0309 08/12/16 0452  NA 138 139 138  K 4.5 4.0 4.0  CL 104 103 101  CO2 26 28 29   GLUCOSE 124* 97 202*  BUN 19 18 21*  CREATININE 1.49* 1.52* 1.75*  CALCIUM 8.4* 8.4* 8.5*   GFR: Estimated Creatinine Clearance: 49.8 mL/min (by C-G formula based on SCr of 1.75 mg/dL (H)). Liver  Function Tests:  Recent Labs Lab 08/10/16 1750  AST 23  ALT 9*  ALKPHOS 67  BILITOT 0.6  PROT 6.6  ALBUMIN 2.7*   No results for input(s): LIPASE, AMYLASE in the last 168 hours. No results for input(s): AMMONIA in the last 168 hours. Coagulation Profile: No results for input(s): INR, PROTIME in the last 168 hours. Cardiac Enzymes: No results for input(s): CKTOTAL, CKMB, CKMBINDEX, TROPONINI in the last 168 hours. BNP (last 3 results) No results for input(s): PROBNP in the last 8760 hours. HbA1C: No results for input(s): HGBA1C in the last 72 hours. CBG:  Recent Labs Lab 08/11/16 0629 08/11/16 1214 08/11/16 1633 08/11/16 2128 08/12/16 0540  GLUCAP 142* 109* 155* 154* 174*   Lipid Profile: No results for input(s): CHOL, HDL, LDLCALC, TRIG, CHOLHDL, LDLDIRECT in the last 72 hours. Thyroid Function Tests: No results for input(s): TSH, T4TOTAL, FREET4, T3FREE, THYROIDAB in the last 72 hours. Anemia Panel: No results for input(s): VITAMINB12, FOLATE, FERRITIN, TIBC, IRON, RETICCTPCT in the last 72 hours. Urine analysis:    Component Value Date/Time   COLORURINE YELLOW 05/11/2016 1600   APPEARANCEUR CLEAR 05/11/2016 1600   LABSPEC 1.021 05/11/2016 1600   PHURINE 5.5 05/11/2016 1600   GLUCOSEU NEGATIVE 05/11/2016 1600   GLUCOSEU NEGATIVE 11/07/2013 0754   HGBUR NEGATIVE 05/11/2016 1600   BILIRUBINUR NEGATIVE 05/11/2016 1600   BILIRUBINUR neg 03/08/2015 1105   KETONESUR NEGATIVE 05/11/2016 1600  PROTEINUR NEGATIVE 05/11/2016 1600   UROBILINOGEN negative 03/08/2015 1105   UROBILINOGEN 0.2 02/09/2014 1805   NITRITE NEGATIVE 05/11/2016 1600   LEUKOCYTESUR NEGATIVE 05/11/2016 1600     RAI,RIPUDEEP M.D. [facility] 08/12/2016, 10:39 AM  Pager: [phone] Between 7am to 7pm - call Pager - [phone]  After 7pm go to [email or web address] - password [email or web address]  Call night coverage person covering after 7pm

## 2016-08-13 DIAGNOSIS — I509 Heart failure, unspecified: Secondary | ICD-10-CM

## 2016-08-13 LAB — GLUCOSE, CAPILLARY
GLUCOSE-CAPILLARY: 128 mg/dL — AB (ref 65–99)
GLUCOSE-CAPILLARY: 137 mg/dL — AB (ref 65–99)
Glucose-Capillary: 118 mg/dL — ABNORMAL HIGH (ref 65–99)
Glucose-Capillary: 177 mg/dL — ABNORMAL HIGH (ref 65–99)

## 2016-08-13 LAB — CBC
HEMATOCRIT: 27.4 % — AB (ref 39.0–52.0)
Hemoglobin: 8.8 g/dL — ABNORMAL LOW (ref 13.0–17.0)
MCH: 29.2 pg (ref 26.0–34.0)
MCHC: 32.1 g/dL (ref 30.0–36.0)
MCV: 91 fL (ref 78.0–100.0)
Platelets: 174 10*3/uL (ref 150–400)
RBC: 3.01 MIL/uL — AB (ref 4.22–5.81)
RDW: 19 % — ABNORMAL HIGH (ref 11.5–15.5)
WBC: 4.1 10*3/uL (ref 4.0–10.5)

## 2016-08-13 LAB — BASIC METABOLIC PANEL
Anion gap: 8 (ref 5–15)
BUN: 20 mg/dL (ref 6–20)
CHLORIDE: 103 mmol/L (ref 101–111)
CO2: 31 mmol/L (ref 22–32)
Calcium: 8.6 mg/dL — ABNORMAL LOW (ref 8.9–10.3)
Creatinine, Ser: 1.6 mg/dL — ABNORMAL HIGH (ref 0.61–1.24)
GFR calc non Af Amer: 41 mL/min — ABNORMAL LOW (ref >60)
GFR, EST AFRICAN AMERICAN: 48 mL/min — AB (ref >60)
Glucose, Bld: 122 mg/dL — ABNORMAL HIGH (ref 65–99)
POTASSIUM: 3.9 mmol/L (ref 3.5–5.1)
SODIUM: 142 mmol/L (ref 135–145)

## 2016-08-13 LAB — TYPE AND SCREEN
ABO/RH(D): A POS
Antibody Screen: NEGATIVE
UNIT DIVISION: 0

## 2016-08-13 MED ORDER — HYDRALAZINE HCL 25 MG PO TABS
25.0000 mg | ORAL_TABLET | Freq: Three times a day (TID) | ORAL | Status: DC
  Administered 2016-08-13 – 2016-08-16 (×8): 25 mg via ORAL
  Filled 2016-08-13 (×10): qty 1

## 2016-08-13 MED ORDER — SODIUM CHLORIDE 0.9 % IV SOLN: INTRAVENOUS | Status: DC

## 2016-08-13 NOTE — Progress Notes (Signed)
[facility]                                                                              Patient Demographics  Michael [name], is a 76 y.o. male, DOB - [birth year], DDU:[id number]  Admit date - 08/10/2016   Admitting Physician Edwin Dada, MD  Outpatient Primary MD for the patient is [name], MD  Outpatient specialists:   LOS - 3  days    Chief Complaint  Patient presents with  . Shortness of Breath  . Wound Infection       Brief summary   Michael Capistran Jonesis a 76 y.o.malewith a past medical history significant for HFpEF, psoriasis on Humira, CKD III baseline Cr 1.4, IDDM, iron def anemia, pAF on apixaban, hypothyoridism, HTN, and CAD with recent DES to [facility] presents with 1 week swelling and now dyspnea/orthopnea. Admitted with CHF exacerbation started on IV lasix     Assessment & Plan    Principal Problem:   Acute on chronic diastolic CHF (congestive heart failure) (HCC) - 2-D echo 1/6 showed EF of 60-65% with grade 2 diastolic dysfunction, PA pressure 53 - Per patient during previous hospitalization, due to AK I, creatinine worsened to 2.0 and hence Lasix was held, gained 17 lbs during his hospitalization. Per patient normal dry weight of ~240lbs, on 1/1 was 260lbs  - Placed on IV diuresis, net negative balance of 8.7 L, weight down from 275 lbs -> 259lbs  - Continue to hold Avapro, cardiology following, creatinine stable  Active Problems: Paroxysmal atrial fibrillation - CHADSVASc 5 on Eliquis  - On triple therapy due to recent stent, on aspirin, Plavix, and eliquis - cont to hold Eliquis given anemia and + FOBT  - Hemoglobin down to 7.7 on 1/17, hence transfused 1 unit packed RBC, hemoglobin 8.8 - Gastroenterology consulted, planning a push enteroscopy on 1/19  - Continue diltiazem, amiodarone and BB  Acute on Chronic iron deficiency anemia  H&H currently stable  CAD -  -s/p DES on 1/3 on triple therapy for 1 month - then  Plavix and Eliquis - asymptomatic  -- Continue ASA/Plavix for now - Continue BB and ARB   MSSA Bacteremia  - Continue Cefazolin   History of C. difficile colitis - Continue oral vancomycin  CKD stage III - Cr at baseline  - Creatinine has trended up with IV diuresis, follow closely , will continue IV diuresis today -  may need to transition to oral Lasix in a.m.   Psoariasis - stable Previously on Humira - last dose on 06/2016  Code Status: FULL DVT Prophylaxis:  SCD's Family Communication: Discussed in detail with the patient, all imaging results, lab results explained to the patient   Disposition Plan:   Time Spent in minutes   25 minutes  Procedures:  None   Consultants:   Cardiology GI  Antimicrobials:   Cefazolin  Oral vancomycin   Medications  Scheduled Meds: . allopurinol  200 mg Oral Daily  . amiodarone  200 mg Oral Daily  . aspirin  81 mg Oral Daily  . atorvastatin  40 mg Oral q1800  .  ceFAZolin (ANCEF)  IV  2 g Intravenous Q8H  . clopidogrel  75 mg Oral Q breakfast  . diltiazem  180 mg Oral Daily  . fenofibrate  160 mg Oral Daily  . ferrous sulfate  325 mg Oral TID WC  . furosemide  40 mg Intravenous BID  . hydrALAZINE  25 mg Oral TID  . insulin aspart  0-15 Units Subcutaneous TID WC  . insulin aspart  0-5 Units Subcutaneous QHS  . insulin glargine  52 Units Subcutaneous Daily  . levothyroxine  150 mcg Oral QAC breakfast  . metoprolol tartrate  75 mg Oral BID  . pantoprazole  40 mg Oral Daily  . potassium chloride  20 mEq Oral Daily  . vancomycin  125 mg Oral Q6H   Continuous Infusions: PRN Meds:.HYDROcodone-acetaminophen, ondansetron **OR** ondansetron (ZOFRAN) IV, sodium chloride flush   Antibiotics   Anti-infectives    Start     Dose/Rate Route Frequency Ordered Stop   08/11/16 1400  ceFAZolin (ANCEF) IVPB 2g/100 mL premix     2 g 200 mL/hr over 30 Minutes Intravenous Every 8 hours 08/11/16 0827     08/11/16 0000  vancomycin  (VANCOCIN) 50 mg/mL oral solution 125 mg     125 mg Oral Every 6 hours 08/10/16 2304     08/10/16 2315  ceFAZolin (ANCEF) IVPB  Status:  Discontinued    Comments:  Indication:  Left wrist septic arthritis with MSSA bacteremia Last Day of Therapy:  08/18/16 Labs - Once weekly:  CBC/D and BMP, Labs - Every other week:  ESR and CRP     2 g Intravenous Every 8 hours 08/10/16 2304 08/10/16 2312   08/10/16 2315  ceFAZolin (ANCEF) IVPB 1 g/50 mL premix  Status:  Discontinued     1 g 100 mL/hr over 30 Minutes Intravenous Every 8 hours 08/10/16 2313 08/11/16 0827        Subjective:   Michael [name] was seen and examined today. Feels better after permanent transfusion. Shortness of breath is improving. Patient denies dizziness, chest pain, abdominal pain, N/V/D/C, new weakness, numbess, tingling. No acute events overnight.    Objective:   Vitals:   08/12/16 1900 08/12/16 2053 08/13/16 0544 08/13/16 0900  BP: (!) 149/73 (!) 167/56 (!) 162/82 (!) 177/53  Pulse: 60 (!) 53 (!) 55 68  Resp: _0 Temp: 98.1 F (36.7 C) 97.7 F (36.5 C) 97.8 F (36.6 C)   TempSrc: Oral Oral Oral   SpO2: 98% 98% 96%   Weight:   117.5 kg (259 lb 1.6 oz)   Height:        Intake/Output Summary (Last 24 hours) at 08/13/16 1202 Last data filed at 08/13/16 1158  Gross per 24 hour  Intake             2165 ml  Output             4800 ml  Net            -2635 ml     Wt Readings from Last 3 Encounters:  08/13/16 117.5 kg (259 lb 1.6 oz)  08/04/16 123.5 kg (272 lb 4.3 oz)  07/07/16 117.9 kg (260 lb)     Exam  General: Alert and oriented x 3, NAD  HEENT:    Neck:   Cardiovascular: S1 S2 auscultated, Irregularly irregular  Respiratory: Decreased breath sound at the bases  Gastrointestinal: Soft, nontender, nondistended, + bowel sounds  Ext: no cyanosis clubbing, 1+ pitting edema  Neuro: No  new deficits  Skin: No rashes  Psych: Normal affect and demeanor, alert and oriented x3    Data  Reviewed:  I have personally reviewed following labs and imaging studies  Micro Results No results found for this or any previous visit (from the past 240 hour(s)).  Radiology Reports Dg Chest 2 View  Result Date: 08/10/2016 CLINICAL DATA:  Shortness breath.  17 pound weight gain in 2 weeks. EXAM: CHEST  2 VIEW COMPARISON:  One-view chest x-ray 08/03/2016. FINDINGS: The heart is enlarged. Atherosclerotic calcifications are present in the aorta. A right-sided PICC line is stable. New bilateral pleural effusions and basilar airspace disease are present. Interstitial edema has increased. No other significant airspace consolidation is present. Vertebral body heights and alignment are maintained. IMPRESSION: 1. Cardiomegaly with increasing interstitial edema and bilateral pleural effusions compatible with congestive heart failure. 2. Aortic atherosclerosis. Electronically Signed   By: San Morelle M.D.   On: 08/10/2016 17:46   Dg Chest 2 View  Result Date: 07/27/2016 CLINICAL DATA:  Generalized chest pressure today.  Recent pneumonia. EXAM: CHEST  2 VIEW COMPARISON:  Chest radiograph July 07, 2016 FINDINGS: Cardiomediastinal silhouette is normal. Mildly calcified aortic knob. Patchy RIGHT middle lobe airspace opacity. Linear densities project LEFT mid and lower lung zone. No pleural effusion. No pneumothorax. Soft tissue planes included osseous structures are nonsuspicious. IMPRESSION: Similar patchy RIGHT middle lobe and lingular/LEFT lung base atelectasis/scarring. Electronically Signed   By: Elon Alas M.D.   On: 07/27/2016 02:21   Dg Wrist Complete Left  Result Date: 07/30/2016 CLINICAL DATA:  Swelling and bulging wound along the central posterior wrist EXAM: LEFT WRIST - COMPLETE 3+ VIEW COMPARISON:  06/06/2008 FINDINGS: No acute fracture or dislocation. Moderate osteoarthritis of the radiocarpal joint. Mild osteoarthritis of the scaphotrapeziotrapezoid joint. Mild osteoarthritis of  the first Buchanan joint. Moderate osteoarthritis of the first Graham joint. Periarticular erosions involving the first metacarpal head as can be seen with gout. Soft tissue swelling along the dorsal aspect the hand which may represent a small hematoma given the patient's history. Peripheral vascular atherosclerotic disease. IMPRESSION: 1.  No acute osseous injury of the left hand. 2. Periarticular erosions involving the first metacarpal head as can be seen with gout. Electronically Signed   By: Kathreen Devoid   On: 07/30/2016 09:39   Dg Chest Port 1 View  Result Date: 08/03/2016 CLINICAL DATA:  Evaluate central venous catheter placement EXAM: PORTABLE CHEST 1 VIEW COMPARISON:  08/03/2016 FINDINGS: There is a right arm PICC line with tip in the projection of the distal SVC. The heart size is moderately enlarged. No pleural effusion or edema. No airspace opacities. IMPRESSION: 1. Right arm PICC line tip projects over the distal SVC. Electronically Signed   By: Kerby Moors M.D.   On: 08/03/2016 16:42   Dg Chest Port 1 View  Result Date: 08/03/2016 CLINICAL DATA:  PICC line placement. EXAM: PORTABLE CHEST 1 VIEW COMPARISON:  07/30/2016 and 07/27/2016. FINDINGS: 1348 hours. Right arm PICC projects to the mid SVC level. The catheter appears to extend into the azygos vein, suboptimally evaluated on this single AP view. Stable cardiomegaly and aortic atherosclerosis. There is mild atelectasis at both lung bases. No confluent airspace opacity, pneumothorax or significant pleural effusion. IMPRESSION: Right arm PICC appears to extend into the azygos vein on this single portable AP examination. Catheter repositioning prior to use and radiographic follow up recommended. These results will be called to the ordering clinician or representative by the Radiologist Assistant, and  communication documented in the PACS or zVision Dashboard. Electronically Signed   By: Richardean Sale M.D.   On: 08/03/2016 14:06   Dg Chest Port 1  View  Result Date: 07/30/2016 CLINICAL DATA:  Increasing shortness of Breath EXAM: PORTABLE CHEST 1 VIEW COMPARISON:  07/27/2016 FINDINGS: Cardiac shadow remains enlarged. The lungs are again well aerated without focal infiltrate. Very mild vascular congestion is seen without interstitial edema. No bony abnormality is noted. IMPRESSION: Mild vascular prominence without interstitial edema. Electronically Signed   By: Inez Catalina M.D.   On: 07/30/2016 13:58   US Abdomen Limited Ruq  Result Date: 07/27/2016 CLINICAL DATA:  Right upper quadrant pain and chest pain, 4 hours duration. EXAM: US ABDOMEN LIMITED - RIGHT UPPER QUADRANT COMPARISON:  None. FINDINGS: Gallbladder: Multiple mobile calculi, measuring up to 1.2 cm. No gallbladder wall thickening or pericholecystic fluid. The patient was not tender over the gallbladder. Common bile duct: Diameter: 3 mm Liver: Mild coarsening of hepatic echotexture without discrete lesion. IMPRESSION: Cholelithiasis without sonographic evidence of acute cholecystitis. Electronically Signed   By: Andreas Newport M.D.   On: 07/27/2016 03:55    Lab Data:  CBC:  Recent Labs Lab 08/10/16 1750 08/11/16 0309 08/12/16 0744 08/12/16 2000 08/13/16 0400  WBC 6.5 5.3 4.1 4.5 4.1  NEUTROABS 5.4  --   --   --   --   HGB 8.5* 8.1* 7.7* 9.2* 8.8*  HCT 27.1* 25.8* 24.3* 29.1* 27.4*  MCV 91.6 91.5 91.4 89.8 91.0  PLT 191 172 145* 174 330   Basic Metabolic Panel:  Recent Labs Lab 08/10/16 1750 08/11/16 0309 08/12/16 0452 08/13/16 0400  NA 138 139 138 142  K 4.5 4.0 4.0 3.9  CL 104 103 101 103  CO2 _0 GLUCOSE 124* 97 202* 122*  BUN 19 18 21* 20  CREATININE 1.49* 1.52* 1.75* 1.60*  CALCIUM 8.4* 8.4* 8.5* 8.6*   GFR: Estimated Creatinine Clearance: 53.6 mL/min (by C-G formula based on SCr of 1.6 mg/dL (H)). Liver Function Tests:  Recent Labs Lab 08/10/16 1750  AST 23  ALT 9*  ALKPHOS 67  BILITOT 0.6  PROT 6.6  ALBUMIN 2.7*   No results  for input(s): LIPASE, AMYLASE in the last 168 hours. No results for input(s): AMMONIA in the last 168 hours. Coagulation Profile: No results for input(s): INR, PROTIME in the last 168 hours. Cardiac Enzymes: No results for input(s): CKTOTAL, CKMB, CKMBINDEX, TROPONINI in the last 168 hours. BNP (last 3 results) No results for input(s): PROBNP in the last 8760 hours. HbA1C: No results for input(s): HGBA1C in the last 72 hours. CBG:  Recent Labs Lab 08/12/16 0540 08/12/16 1130 08/12/16 1644 08/12/16 2227 08/13/16 0541  GLUCAP 174* 167* 142* 149* 118*   Lipid Profile: No results for input(s): CHOL, HDL, LDLCALC, TRIG, CHOLHDL, LDLDIRECT in the last 72 hours. Thyroid Function Tests: No results for input(s): TSH, T4TOTAL, FREET4, T3FREE, THYROIDAB in the last 72 hours. Anemia Panel: No results for input(s): VITAMINB12, FOLATE, FERRITIN, TIBC, IRON, RETICCTPCT in the last 72 hours. Urine analysis:    Component Value Date/Time   COLORURINE YELLOW 05/11/2016 1600   APPEARANCEUR CLEAR 05/11/2016 1600   LABSPEC 1.021 05/11/2016 1600   PHURINE 5.5 05/11/2016 1600   GLUCOSEU NEGATIVE 05/11/2016 1600   GLUCOSEU NEGATIVE 11/07/2013 0754   HGBUR NEGATIVE 05/11/2016 1600   BILIRUBINUR NEGATIVE 05/11/2016 1600   BILIRUBINUR neg 03/08/2015 [place] 05/11/2016 1600   PROTEINUR NEGATIVE 05/11/2016  1600   UROBILINOGEN negative 03/08/2015 1105   UROBILINOGEN 0.2 02/09/2014 1805   NITRITE NEGATIVE 05/11/2016 1600   LEUKOCYTESUR NEGATIVE 05/11/2016 1600     RAI,RIPUDEEP M.D. [facility] 08/13/2016, 12:02 PM  Pager: [phone] Between 7am to 7pm - call Pager - [phone]  After 7pm go to [email or web address] - password [email or web address]  Call night coverage person covering after 7pm

## 2016-08-13 NOTE — Progress Notes (Signed)
Patient Name: Michael [name] Date of Encounter: 08/13/2016  Primary Cardiologist: Dr. Debbe Odea Problem List     Principal Problem:   Acute on chronic diastolic CHF (congestive heart failure) ([place]) Active Problems:   DM (diabetes mellitus), type 2 with peripheral vascular complications (HCC)   Iron deficiency anemia due to chronic blood loss   Morbid obesity due to excess calories (HCC)   Gout   Psoriatic arthritis (HCC)   PAF (paroxysmal atrial fibrillation) ([place])   Essential hypertension   Hypothyroidism   Staphylococcus aureus bacteremia   CAD (coronary artery disease)   History of Clostridium difficile colitis     Subjective   No complaints. Breathing much better. No CP  Inpatient Medications    Scheduled Meds: . allopurinol  200 mg Oral Daily  . amiodarone  200 mg Oral Daily  . aspirin  81 mg Oral Daily  . atorvastatin  40 mg Oral q1800  .  ceFAZolin (ANCEF) IV  2 g Intravenous Q8H  . clopidogrel  75 mg Oral Q breakfast  . diltiazem  180 mg Oral Daily  . fenofibrate  160 mg Oral Daily  . ferrous sulfate  325 mg Oral TID WC  . furosemide  40 mg Intravenous BID  . insulin aspart  0-15 Units Subcutaneous TID WC  . insulin aspart  0-5 Units Subcutaneous QHS  . insulin glargine  52 Units Subcutaneous Daily  . levothyroxine  150 mcg Oral QAC breakfast  . metoprolol tartrate  75 mg Oral BID  . pantoprazole  40 mg Oral Daily  . potassium chloride  20 mEq Oral Daily  . vancomycin  125 mg Oral Q6H   Continuous Infusions:  PRN Meds: HYDROcodone-acetaminophen, ondansetron **OR** ondansetron (ZOFRAN) IV, sodium chloride flush   Vital Signs    Vitals:   08/12/16 1545 08/12/16 1900 08/12/16 2053 08/13/16 0544  BP: (!) 151/60 (!) 149/73 (!) 167/56 (!) 162/82  Pulse: (!) 54 60 (!) 53 (!) 55  Resp: 18 17 18 18   Temp: 98.6 F (37 C) 98.1 F (36.7 C) 97.7 F (36.5 C) 97.8 F (36.6 C)  TempSrc: Oral Oral Oral Oral  SpO2: 100% 98% 98% 96%  Weight:    259  lb 1.6 oz (117.5 kg)  Height:        Intake/Output Summary (Last 24 hours) at 08/13/16 0902 Last data filed at 08/13/16 0544  Gross per 24 hour  Intake             1245 ml  Output             4400 ml  Net            -3155 ml   Filed Weights   08/11/16 0625 08/12/16 0343 08/13/16 0544  Weight: 273 lb 3.2 oz (123.9 kg) 267 lb 3.2 oz (121.2 kg) 259 lb 1.6 oz (117.5 kg)    Physical Exam   GEN: Well nourished, well developed, in no acute distress. obese HEENT: Grossly normal.  Neck: Supple, no JVD, carotid bruits, or masses. Cardiac: RRR, + SEM @ RUSB, rubs, or gallops. No clubbing, cyanosis, 1 + bilateral LE edema.  Radials/DP/PT 2+ and equal bilaterally.  Respiratory:  Respirations regular and unlabored, mild crackles at bases GI: Soft, nontender, nondistended, BS + x 4. MS: no deformity or atrophy. Skin: warm and dry, no rash. Neuro:  Strength and sensation are intact. Psych: AAOx3.  Normal affect.  Labs    CBC  Recent Labs  08/10/16  1750  08/12/16 2000 08/13/16 0400  WBC 6.5  < > 4.5 4.1  NEUTROABS 5.4  --   --   --   HGB 8.5*  < > 9.2* 8.8*  HCT 27.1*  < > 29.1* 27.4*  MCV 91.6  < > 89.8 91.0  PLT 191  < > 174 174  < > = values in this interval not displayed. Basic Metabolic Panel  Recent Labs  08/12/16 0452 08/13/16 0400  NA 138 142  K 4.0 3.9  CL 101 103  CO2 29 31  GLUCOSE 202* 122*  BUN 21* 20  CREATININE 1.75* 1.60*  CALCIUM 8.5* 8.6*   Liver Function Tests  Recent Labs  08/10/16 1750  AST 23  ALT 9*  ALKPHOS 67  BILITOT 0.6  PROT 6.6  ALBUMIN 2.7*   No results for input(s): LIPASE, AMYLASE in the last 72 hours. Cardiac Enzymes No results for input(s): CKTOTAL, CKMB, CKMBINDEX, TROPONINI in the last 72 hours. BNP Invalid input(s): POCBNP D-Dimer No results for input(s): DDIMER in the last 72 hours. Hemoglobin A1C No results for input(s): HGBA1C in the last 72 hours. Fasting Lipid Panel No results for input(s): CHOL, HDL, LDLCALC,  TRIG, CHOLHDL, LDLDIRECT in the last 72 hours. Thyroid Function Tests No results for input(s): TSH, T4TOTAL, T3FREE, THYROIDAB in the last 72 hours.  Invalid input(s): FREET3  Telemetry    Sinus bradycardia- Personally Reviewed  ECG    NSR HR 65 - Personally Reviewed  Radiology    No results found.  Cardiac Studies   2D ECHO: 08/01/2016 Study Conclusions - Left ventricle: The cavity size was normal. Wall thickness was   increased in a pattern of severe LVH. Systolic function was   normal. The estimated ejection fraction was in the range of 60%   to 65%. Wall motion was normal; there were no regional wall   motion abnormalities. Features are consistent with a pseudonormal   left ventricular filling pattern, with concomitant abnormal   relaxation and increased filling pressure (grade 2 diastolic   dysfunction). Doppler parameters are consistent with high   ventricular filling pressure. - Aortic valve: Valve mobility was restricted. There was moderate   stenosis. - Mitral valve: Calcified annulus. - Left atrium: The atrium was mildly dilated. - Pulmonary arteries: Systolic pressure was moderately increased.   PA peak pressure: 53 mm Hg (S). Impressions: - Normal LV systolic function; grade 2 diastolic dysfunction;   severe LVH; calcified aortic valve with moderate AS (mean   gradient 20 mmHg); mild LAE; trace TR; moderately elevated   pulmonary pressure.  Patient Profile     Michael [name] is a pleasant obese 76 year old male with past medical history of CAD s/p recent DES to [place] on 07/29/2016, PAF on eliquis, history of GI bleed with AVM, stage III CKD, HTN, HLD, DM, bilateral carotid stenosis and a right subclavian stenosis who presented to [facility] on 08/12[facility]16 with worsening SOB, found to be in heart failure and also severe anemia with positive hemoccult.   Assessment & Plan     1. Acute on chronic diastolic heart failure: volume status close to baseline. Net neg 8.4 L and  weight down 16 lbs. Will continue IV Lasix 40 mg BID today with plans to convert to PO tomorrow. Creatinine 1.6 today (down from 1.7 yesterday).  2. Recurrent GI bleed, history of AVM: H/O GIB in past with AVMs documented by pill endoscopy as well as recent polypectomy in Dec [name]). His eliquis was stopped 08/11/16,  which is appropriate. Given the fresh stent, he will need aspirin and Plavix for at least 3 months. Hg up to 8.8 with PRBC transfusion. Would Tx PRBCs for HGB < 7.5. GI saw yesterday. Plan is for push enteroscopy tomorrow to hopefully identify and ablated AVMS. Scheduled for tomorrow given last dose of Eliquis on 08/10/16 PM. Okay to do procedure on plavix.  3. PAF on amiodarone: Eliquis stopped 08/11/16. Currently maintaining sinus rhythm.  4. stage III CKD: Baseline creatinine 1.4-1.5, current creatinine 1.6.   5. Moderate AS: 3/6 systolic murmur noted at right upper sternal border. Continue to follow  6. HTN: Continue metoprolol and diltiazem. BPs have been uncontrolled. Cannot increase AVN blocking agents given baseline bradycardia. Will add hydralazine 25mg  TID which can be titrated up  7. HLD: On Lipitor 40 mg daily  8. Recent MSSA bacteremia and infected left wrist: On vancomycin and cefazolin during the last admission, also on this admission as well.  Signed, Angelena Form, PA-C  08/13/2016, 9:02 AM  Agree with note by Nell Range PA-C  Good diuresis (7.8 L). SCr improved 1.75---> 1.6. Breathing better. S/P Tx 1 u PRBCs with increase in HGB. For EGD tomorrow. Eliquis stopped. NSR on Amio. Will need to be D/C don PO lasix. Lungs clear. No periph edema.    Lorretta Harp, M.D., [place], [facility], [place] [address] [place], [place]  [address]  [phone] 08/13/2016 11:32 AM

## 2016-08-13 NOTE — Progress Notes (Signed)
Consent signed for "Push Enteroscopy" to be done tomorrow.  Patient will be NPO after midnight.  He and his wife, at bedside, updated on plan of care.

## 2016-08-13 NOTE — Progress Notes (Signed)
     [facility] Progress Note  Chief Complaint:   Worsening anemia  Subjective: Feels okay. No overt bleeding. Breathing better after diuresis.   Objective:  Vital signs in last 24 hours: Temp:  [97.7 F (36.5 C)-98.6 F (37 C)] 97.8 F (36.6 C) (01/18 0544) Pulse Rate:  [53-68] 68 (01/18 0900) Resp:  [17-18] 18 (01/18 0544) BP: (149-177)/(53-82) 177/53 (01/18 0900) SpO2:  [96 %-100 %] 96 % (01/18 0544) Weight:  [259 lb 1.6 oz (117.5 kg)] 259 lb 1.6 oz (117.5 kg) (01/18 0544) Last BM Date: 08/12/16 General:   Alert, obese white male  in NAD EENT:  Normal hearing, non icteric sclera, conjunctive pink.  Heart:  Regular rate and rhythm; + murmur, 2+ BLE edema Pulm: Normal respiratory effort, lungs. Abdomen:  Soft, nondistended, nontender.  Normal bowel sounds, no masses felt.  Neurologic:  Alert and  oriented x4;  grossly normal neurologically. Psych:  Alert and cooperative. Normal mood and affect.   Intake/Output from previous day: 01/17 0701 - 01/18 0700 In: 8341 [P.O.:600; I.V.:20; Blood:335; IV Piggyback:500] Out: 4400 [Urine:4400] Intake/Output this shift: Total I/O In: 840 [P.O.:840] Out: 400 [Urine:400]  Lab Results:  Recent Labs  08/12/16 0744 08/12/16 2000 08/13/16 0400  WBC 4.1 4.5 4.1  HGB 7.7* 9.2* 8.8*  HCT 24.3* 29.1* 27.4*  PLT 145* 174 174   BMET  Recent Labs  08/11/16 0309 08/12/16 0452 08/13/16 0400  NA 139 138 142  K 4.0 4.0 3.9  CL 103 101 103  CO2 28 29 31   GLUCOSE 97 202* 122*  BUN 18 21* 20  CREATININE 1.52* 1.75* 1.60*  CALCIUM 8.4* 8.5* 8.6*   LFT  Recent Labs  08/10/16 1750  PROT 6.6  ALBUMIN 2.7*  AST 23  ALT 9*  ALKPHOS 53  BILITOT 0.6    Assessment / Plan:  1. 76 yo male with progression of chronic anemia, FOBT+ on Eliquis,Plavix and ASA  Hgb 12.3 on 07/29/16, down to 8.5 on 08/10/16 and 7.7 yesterday, Received IV iron and a unit of blood yesterday,  hgb up to 8.8 this am. Eliquis on hold. Plavix  ongoing. Stools sometimes day on oral iron -Recent colonoscopy unrevealing..  -Hx of multiple SB AVMs documented on capsule study in 2013.  Plan is for push enteroscopy in am when Eliquis out of system. Reviewed plan with the patient. NPO after midnight  2. CAD,DES placed earlier this month.   3. Acute on chronic diastolic heart failure. Lost several pounds after diuresis. Breathing better.   4. PAF, on amiodarone. Eliquis currently on hold. Regular rate / rhythm this am.   5. MSSA bacteremia, on Cefazoin  6. Recent C-diff. His home vanco is being continued. No diarrhea  Principal Problem:   Acute on chronic diastolic CHF (congestive heart failure) (HCC) Active Problems:   DM (diabetes mellitus), type 2 with peripheral vascular complications (HCC)   Iron deficiency anemia due to chronic blood loss   Morbid obesity due to excess calories (HCC)   Gout   Psoriatic arthritis (HCC)   PAF (paroxysmal atrial fibrillation) ([place])   Essential hypertension   Hypothyroidism   Staphylococcus aureus bacteremia   CAD (coronary artery disease)   History of Clostridium difficile colitis     LOS: 3 days   Tye Savoy NP 08/13/2016, 10:42 AM  Pager number [phone]

## 2016-08-14 ENCOUNTER — Inpatient Hospital Stay: Payer: Medicare Other | Admitting: Internal Medicine

## 2016-08-14 ENCOUNTER — Inpatient Hospital Stay (HOSPITAL_COMMUNITY): Payer: Medicare Other | Admitting: Certified Registered Nurse Anesthetist

## 2016-08-14 ENCOUNTER — Encounter (HOSPITAL_COMMUNITY): Payer: Self-pay | Admitting: Certified Registered Nurse Anesthetist

## 2016-08-14 ENCOUNTER — Encounter (HOSPITAL_COMMUNITY)
Admission: EM | Disposition: A | Discharge status: Home / Self Care | Payer: Self-pay | Source: Home / Self Care | Attending: Internal Medicine

## 2016-08-14 DIAGNOSIS — I5030 Unspecified diastolic (congestive) heart failure: Secondary | ICD-10-CM

## 2016-08-14 HISTORY — PX: ENTEROSCOPY: SHX5533

## 2016-08-14 HISTORY — PX: GIVENS CAPSULE STUDY: SHX5432

## 2016-08-14 LAB — CBC
HEMATOCRIT: 28.4 % — AB (ref 39.0–52.0)
Hemoglobin: 9 g/dL — ABNORMAL LOW (ref 13.0–17.0)
MCH: 28.7 pg (ref 26.0–34.0)
MCHC: 31.7 g/dL (ref 30.0–36.0)
MCV: 90.4 fL (ref 78.0–100.0)
PLATELETS: 156 10*3/uL (ref 150–400)
RBC: 3.14 MIL/uL — ABNORMAL LOW (ref 4.22–5.81)
RDW: 19.1 % — AB (ref 11.5–15.5)
WBC: 3.4 10*3/uL — ABNORMAL LOW (ref 4.0–10.5)

## 2016-08-14 LAB — GLUCOSE, CAPILLARY
GLUCOSE-CAPILLARY: 79 mg/dL (ref 65–99)
GLUCOSE-CAPILLARY: 93 mg/dL (ref 65–99)
Glucose-Capillary: 101 mg/dL — ABNORMAL HIGH (ref 65–99)
Glucose-Capillary: 73 mg/dL (ref 65–99)
Glucose-Capillary: 72 mg/dL (ref 65–99)

## 2016-08-14 LAB — BASIC METABOLIC PANEL
Anion gap: 10 (ref 5–15)
BUN: 23 mg/dL — ABNORMAL HIGH (ref 6–20)
CALCIUM: 8.7 mg/dL — AB (ref 8.9–10.3)
CO2: 30 mmol/L (ref 22–32)
CREATININE: 1.54 mg/dL — AB (ref 0.61–1.24)
Chloride: 99 mmol/L — ABNORMAL LOW (ref 101–111)
GFR calc Af Amer: 50 mL/min — ABNORMAL LOW (ref >60)
GFR, EST NON AFRICAN AMERICAN: 43 mL/min — AB (ref >60)
Glucose, Bld: 100 mg/dL — ABNORMAL HIGH (ref 65–99)
Potassium: 3.9 mmol/L (ref 3.5–5.1)
SODIUM: 139 mmol/L (ref 135–145)

## 2016-08-14 SURGERY — ENTEROSCOPY
Anesthesia: Monitor Anesthesia Care

## 2016-08-14 SURGERY — IMAGING PROCEDURE, GI TRACT, INTRALUMINAL, VIA CAPSULE
Anesthesia: LOCAL

## 2016-08-14 MED ORDER — SODIUM CHLORIDE 0.9 % IV SOLN
INTRAVENOUS | Status: DC | PRN
  Administered 2016-08-14: 08:00:00 via INTRAVENOUS

## 2016-08-14 MED ORDER — LIDOCAINE 2% (20 MG/ML) 5 ML SYRINGE
INTRAMUSCULAR | Status: DC | PRN
  Administered 2016-08-14: 40 mg via INTRAVENOUS

## 2016-08-14 MED ORDER — PROPOFOL 500 MG/50ML IV EMUL
INTRAVENOUS | Status: DC | PRN
  Administered 2016-08-14: 75 ug/kg/min via INTRAVENOUS

## 2016-08-14 MED ORDER — BUTAMBEN-TETRACAINE-BENZOCAINE 2-2-14 % EX AERO
INHALATION_SPRAY | CUTANEOUS | Status: DC | PRN
Start: 2016-08-14 — End: 2016-08-14
  Administered 2016-08-14: 2 via TOPICAL

## 2016-08-14 MED ORDER — PROPOFOL 10 MG/ML IV BOLUS
INTRAVENOUS | Status: DC | PRN
  Administered 2016-08-14 (×2): 20 mg via INTRAVENOUS

## 2016-08-14 MED ORDER — PEG-KCL-NACL-NASULF-NA ASC-C 100 G PO SOLR
1.0000 | Freq: Once | ORAL | Status: DC
  Filled 2016-08-14: qty 1

## 2016-08-14 SURGICAL SUPPLY — 1 items: TOWEL COTTON PACK 4EA (MISCELLANEOUS) ×4 IMPLANT

## 2016-08-14 NOTE — Op Note (Signed)
[facility] Patient Name: Michael [name] Procedure Date : 08/14/2016 MRN: [id number] Attending MD: Carlota Raspberry. Diesha Rostad MD, MD Date of Birth: [birth year] CSN: [id number] Age: 76 Admit Type: Inpatient Procedure:                Small bowel enteroscopy Indications:              anemia secondary to chronic blood loss - on                            aspirin, plavix, and Eliquis - history of small                            bowel AVMs Providers:                Carlota Raspberry. Fardowsa Authier MD, MD, Vista Lawman, RN,                            [facility], Technician, Leola Brazil. Ouida Sills                            CRNA, CRNA Referring MD:              Medicines:                Monitored Anesthesia Care Complications:            No immediate complications. Estimated blood loss:                            Minimal. Estimated Blood Loss:     Estimated blood loss was minimal. Procedure:                Pre-Anesthesia Assessment:                           - Prior to the procedure, a History and Physical                            was performed, and patient medications and                            allergies were reviewed. The patient's tolerance of                            previous anesthesia was also reviewed. The risks                            and benefits of the procedure and the sedation                            options and risks were discussed with the patient.                            All questions were answered, and informed consent  was obtained. Prior Anticoagulants: The patient has                            taken Plavix (clopidogrel), last dose was 1 day                            prior to procedure. ASA Grade Assessment: III - A                            patient with severe systemic disease. After                            reviewing the risks and benefits, the patient was                            deemed in satisfactory condition to undergo  the                            procedure.                           After obtaining informed consent, the endoscope was                            passed under direct vision. Throughout the                            procedure, the patient's blood pressure, pulse, and                            oxygen saturations were monitored continuously. The                            EC-[id number] ([id number]) scope was introduced through                            the mouth and advanced to the proximal jejunum. The                            small bowel enteroscopy was accomplished without                            difficulty. The patient tolerated the procedure                            well. Scope In: Scope Out: Findings:      Esophagogastric landmarks were identified: the Z-line was found at 45       cm, the gastroesophageal junction was found at 45 cm and the upper       extent of the gastric folds was found at 45 cm from the incisors.      Localized mucosal changes were found at the gastroesophageal junction -       a tongue of < 1cm segment of darkly pigmented mucosa, without  nodularity, and no mucosal breaks, no evidence of esophagitis. Biopsies       were taken with a cold forceps for histology.      The exam of the esophagus was otherwise normal.      The entire examined stomach was normal. There was mild mucosal       irritation along greater curvature of the stomach with retroflexion (in       case noted on capsule).      There was no evidence of significant pathology in the entire examined       duodenum. Upon withdrawal in the duodenum there was mild endoscopic       irritation causing a few red spots (in case noted on capsule study).      There was no evidence of significant pathology in the proximal jejunum.       Perhaps one diminutive focal red spot (61mm in size) along a fold in the       proximal jejunum which was not able to be well visualized following       lavage, and I  doubt in itself causing anemia. Impression:               - Esophagogastric landmarks identified.                           - Mucosal changes in the esophagus. Biopsied.                           - Normal stomach.                           - Normal examined duodenum.                           - The examined portion of the jejunum was normal.                           Overall, no pathology to cause the patient's level                            of anemia on this exam. Recommend capsule endoscopy                            to clarify extent and location of suspected small                            bowel AVMs. If high burden of multiple scattered                            AVMs, octreotide therapy should be entertained. If                            only one or a few larger AVMs, balloon enteroscopy                            with ablation may be considered. Recommendation:           - Return patient to hospital ward  for ongoing care.                           - Recommend capsule endoscopy if no                            contraindications as outlined above                           - Clear liquid diet today until capsule endoscopy                           - NPO after capsule endoscopy                           - Further recommendations pending results of capsule                           - Await pathology results Procedure Code(s):        --- Professional ---                           [phone], Small intestinal endoscopy, enteroscopy                            beyond second portion of duodenum, not including                            ileum; with biopsy, single or multiple Diagnosis Code(s):        --- Professional ---                           K22.8, Other specified diseases of esophagus                           D50.0, Iron deficiency anemia secondary to blood                            loss (chronic) CPT copyright 2016 American Medical Association. All rights reserved. The codes  documented in this report are preliminary and upon coder review may  be revised to meet current compliance requirements. Remo Lipps P. Oriana Horiuchi MD, MD 08/14/2016 9:48:07 AM This report has been signed electronically. Number of Addenda: 0

## 2016-08-14 NOTE — Progress Notes (Signed)
[facility]                                                                              Patient Demographics  Michael [name], is a 76 y.o. male, DOB - 12/13/1940, AFH:[id number]  Admit date - 08/10/2016   Admitting Physician Edwin Dada, MD  Outpatient Primary MD for the patient is [name], MD  Outpatient specialists:   LOS - 4  days    Chief Complaint  Patient presents with  . Shortness of Breath  . Wound Infection       Brief summary   Michael Behnken Jonesis a 76 y.o.malewith a past medical history significant for HFpEF, psoriasis on Humira, CKD III baseline Cr 1.4, IDDM, iron def anemia, pAF on apixaban, hypothyoridism, HTN, and CAD with recent DES to [facility] presents with 1 week swelling and now dyspnea/orthopnea. Admitted with CHF exacerbation started on IV lasix     Assessment & Plan    Principal Problem:   Acute on chronic diastolic CHF (congestive heart failure) (HCC) - 2-D echo 1/6 showed EF of 60-65% with grade 2 diastolic dysfunction, PA pressure 53 - Per patient during previous hospitalization, due to AK I, creatinine worsened to 2.0 and hence Lasix was held, gained 17 lbs during his hospitalization. Per patient normal dry weight of ~240lbs, on 1/1 was 260lbs  - Placed on IV diuresis, net negative balance of 11 L, weight down from 275 lbs -> 256 lbs  - Continue to hold Avapro, cardiology following, creatinine stable  Active Problems: Paroxysmal atrial fibrillation - CHADSVASc 5 on Eliquis  - On triple therapy due to recent stent, on aspirin, Plavix, and eliquis - cont to hold Eliquis given anemia and + FOBT  - Hemoglobin down to 7.7 on 1/17, hence transfused 1 unit packed RBC, hemoglobin 9.0 - Gastroenterology consulted, egd today  - Continue diltiazem, amiodarone and BB  Acute on Chronic iron deficiency anemia  - H&H currently stable - enteroscopy normal, planning capsule endoscopy   CAD -  -s/p DES on 1/3 on triple  therapy for 1 month - then Plavix and Eliquis - asymptomatic  -- Continue ASA/Plavix for now - Continue BB and ARB   MSSA Bacteremia  - Continue Cefazolin   History of C. difficile colitis - Continue oral vancomycin  CKD stage III - Cr at baseline  - Creatinine stable  -  may need to transition to oral Lasix in a.m.   Psoariasis - stable Previously on Humira - last dose on 06/2016  Code Status: FULL DVT Prophylaxis:  SCD's Family Communication: Discussed in detail with the patient, all imaging results, lab results explained to the patient   Disposition Plan:   Time Spent in minutes   25 minutes  Procedures:  None   Consultants:   Cardiology GI  Antimicrobials:   Cefazolin  Oral vancomycin   Medications  Scheduled Meds: . allopurinol  200 mg Oral Daily  . amiodarone  200 mg Oral Daily  . aspirin  81 mg Oral Daily  . atorvastatin  40 mg Oral q1800  .  ceFAZolin (ANCEF) IV  2 g Intravenous Q8H  .  clopidogrel  75 mg Oral Q breakfast  . diltiazem  180 mg Oral Daily  . fenofibrate  160 mg Oral Daily  . ferrous sulfate  325 mg Oral TID WC  . furosemide  40 mg Intravenous BID  . hydrALAZINE  25 mg Oral TID  . insulin aspart  0-15 Units Subcutaneous TID WC  . insulin aspart  0-5 Units Subcutaneous QHS  . insulin glargine  52 Units Subcutaneous Daily  . levothyroxine  150 mcg Oral QAC breakfast  . metoprolol tartrate  75 mg Oral BID  . pantoprazole  40 mg Oral Daily  . peg 3350 powder  1 kit Oral Once  . potassium chloride  20 mEq Oral Daily  . vancomycin  125 mg Oral Q6H   Continuous Infusions: PRN Meds:.HYDROcodone-acetaminophen, ondansetron **OR** ondansetron (ZOFRAN) IV, sodium chloride flush   Antibiotics   Anti-infectives    Start     Dose/Rate Route Frequency Ordered Stop   08/11/16 1400  ceFAZolin (ANCEF) IVPB 2g/100 mL premix     2 g 200 mL/hr over 30 Minutes Intravenous Every 8 hours 08/11/16 0827     08/11/16 0000  vancomycin (VANCOCIN) 50  mg/mL oral solution 125 mg     125 mg Oral Every 6 hours 08/10/16 2304     08/10/16 2315  ceFAZolin (ANCEF) IVPB  Status:  Discontinued    Comments:  Indication:  Left wrist septic arthritis with MSSA bacteremia Last Day of Therapy:  08/18/16 Labs - Once weekly:  CBC/D and BMP, Labs - Every other week:  ESR and CRP     2 g Intravenous Every 8 hours 08/10/16 2304 08/10/16 2312   08/10/16 2315  ceFAZolin (ANCEF) IVPB 1 g/50 mL premix  Status:  Discontinued     1 g 100 mL/hr over 30 Minutes Intravenous Every 8 hours 08/10/16 2313 08/11/16 0827        Subjective:   Michael [name] was seen and examined today. No complaints. Shortness of breath is improving. Patient denies dizziness, chest pain, abdominal pain, N/V/D/C, new weakness, numbess, tingling. No acute events overnight.    Objective:   Vitals:   08/14/16 0950 08/14/16 1000 08/14/16 1137 08/14/16 1450  BP: (!) 130/42 (!) 147/43 (!) 169/46   Pulse: (!) 48 (!) 50    Resp: _0 Temp:      TempSrc:      SpO2: 94% 96% 97%   Weight:    116.2 kg (256 lb 3.2 oz)  Height:    5' 11" (1.803 m)    Intake/Output Summary (Last 24 hours) at 08/14/16 1514 Last data filed at 08/14/16 1319  Gross per 24 hour  Intake              870 ml  Output             3375 ml  Net            -2505 ml     Wt Readings from Last 3 Encounters:  08/14/16 116.2 kg (256 lb 3.2 oz)  08/04/16 123.5 kg (272 lb 4.3 oz)  07/07/16 117.9 kg (260 lb)     Exam  General: Alert and oriented x 3, NAD  HEENT:    Neck:   Cardiovascular: S1 S2 auscultated, Irregularly irregular  Respiratory: Decreased breath sound at the bases  Gastrointestinal: Soft, NT, ND, NBS   Ext: no cyanosis clubbing, 1+ pitting edema  Neuro: No new deficits  Skin: No rashes  Psych:  Normal affect and demeanor, alert and oriented x3    Data Reviewed:  I have personally reviewed following labs and imaging studies  Micro Results No results found for this or any  previous visit (from the past 240 hour(s)).  Radiology Reports Dg Chest 2 View  Result Date: 08/10/2016 CLINICAL DATA:  Shortness breath.  17 pound weight gain in 2 weeks. EXAM: CHEST  2 VIEW COMPARISON:  One-view chest x-ray 08/03/2016. FINDINGS: The heart is enlarged. Atherosclerotic calcifications are present in the aorta. A right-sided PICC line is stable. New bilateral pleural effusions and basilar airspace disease are present. Interstitial edema has increased. No other significant airspace consolidation is present. Vertebral body heights and alignment are maintained. IMPRESSION: 1. Cardiomegaly with increasing interstitial edema and bilateral pleural effusions compatible with congestive heart failure. 2. Aortic atherosclerosis. Electronically Signed   By: San Morelle M.D.   On: 08/10/2016 17:46   Dg Chest 2 View  Result Date: 07/27/2016 CLINICAL DATA:  Generalized chest pressure today.  Recent pneumonia. EXAM: CHEST  2 VIEW COMPARISON:  Chest radiograph July 07, 2016 FINDINGS: Cardiomediastinal silhouette is normal. Mildly calcified aortic knob. Patchy RIGHT middle lobe airspace opacity. Linear densities project LEFT mid and lower lung zone. No pleural effusion. No pneumothorax. Soft tissue planes included osseous structures are nonsuspicious. IMPRESSION: Similar patchy RIGHT middle lobe and lingular/LEFT lung base atelectasis/scarring. Electronically Signed   By: Elon Alas M.D.   On: 07/27/2016 02:21   Dg Wrist Complete Left  Result Date: 07/30/2016 CLINICAL DATA:  Swelling and bulging wound along the central posterior wrist EXAM: LEFT WRIST - COMPLETE 3+ VIEW COMPARISON:  06/06/2008 FINDINGS: No acute fracture or dislocation. Moderate osteoarthritis of the radiocarpal joint. Mild osteoarthritis of the scaphotrapeziotrapezoid joint. Mild osteoarthritis of the first [place] joint. Moderate osteoarthritis of the first [place] joint. Periarticular erosions involving the first metacarpal  head as can be seen with gout. Soft tissue swelling along the dorsal aspect the hand which may represent a small hematoma given the patient's history. Peripheral vascular atherosclerotic disease. IMPRESSION: 1.  No acute osseous injury of the left hand. 2. Periarticular erosions involving the first metacarpal head as can be seen with gout. Electronically Signed   By: Kathreen Devoid   On: 07/30/2016 09:39   Dg Chest Port 1 View  Result Date: 08/03/2016 CLINICAL DATA:  Evaluate central venous catheter placement EXAM: PORTABLE CHEST 1 VIEW COMPARISON:  08/03/2016 FINDINGS: There is a right arm PICC line with tip in the projection of the distal SVC. The heart size is moderately enlarged. No pleural effusion or edema. No airspace opacities. IMPRESSION: 1. Right arm PICC line tip projects over the distal SVC. Electronically Signed   By: Kerby Moors M.D.   On: 08/03/2016 16:42   Dg Chest Port 1 View  Result Date: 08/03/2016 CLINICAL DATA:  PICC line placement. EXAM: PORTABLE CHEST 1 VIEW COMPARISON:  07/30/2016 and 07/27/2016. FINDINGS: 1348 hours. Right arm PICC projects to the mid SVC level. The catheter appears to extend into the azygos vein, suboptimally evaluated on this single AP view. Stable cardiomegaly and aortic atherosclerosis. There is mild atelectasis at both lung bases. No confluent airspace opacity, pneumothorax or significant pleural effusion. IMPRESSION: Right arm PICC appears to extend into the azygos vein on this single portable AP examination. Catheter repositioning prior to use and radiographic follow up recommended. These results will be called to the ordering clinician or representative by the Radiologist Assistant, and communication documented in the PACS or zVision Dashboard.  Electronically Signed   By: Richardean Sale M.D.   On: 08/03/2016 14:06   Dg Chest Port 1 View  Result Date: 07/30/2016 CLINICAL DATA:  Increasing shortness of Breath EXAM: PORTABLE CHEST 1 VIEW COMPARISON:   07/27/2016 FINDINGS: Cardiac shadow remains enlarged. The lungs are again well aerated without focal infiltrate. Very mild vascular congestion is seen without interstitial edema. No bony abnormality is noted. IMPRESSION: Mild vascular prominence without interstitial edema. Electronically Signed   By: Inez Catalina M.D.   On: 07/30/2016 13:58   US Abdomen Limited Ruq  Result Date: 07/27/2016 CLINICAL DATA:  Right upper quadrant pain and chest pain, 4 hours duration. EXAM: US ABDOMEN LIMITED - RIGHT UPPER QUADRANT COMPARISON:  None. FINDINGS: Gallbladder: Multiple mobile calculi, measuring up to 1.2 cm. No gallbladder wall thickening or pericholecystic fluid. The patient was not tender over the gallbladder. Common bile duct: Diameter: 3 mm Liver: Mild coarsening of hepatic echotexture without discrete lesion. IMPRESSION: Cholelithiasis without sonographic evidence of acute cholecystitis. Electronically Signed   By: Andreas Newport M.D.   On: 07/27/2016 03:55    Lab Data:  CBC:  Recent Labs Lab 08/10/16 1750 08/11/16 0309 08/12/16 0744 08/12/16 2000 08/13/16 0400 08/14/16 0435  WBC 6.5 5.3 4.1 4.5 4.1 3.4*  NEUTROABS 5.4  --   --   --   --   --   HGB 8.5* 8.1* 7.7* 9.2* 8.8* 9.0*  HCT 27.1* 25.8* 24.3* 29.1* 27.4* 28.4*  MCV 91.6 91.5 91.4 89.8 91.0 90.4  PLT 191 172 145* 174 174 619   Basic Metabolic Panel:  Recent Labs Lab 08/10/16 1750 08/11/16 0309 08/12/16 0452 08/13/16 0400 08/14/16 0435  NA 138 139 138 142 139  K 4.5 4.0 4.0 3.9 3.9  CL 104 103 101 103 99*  CO2 _0 GLUCOSE 124* 97 202* 122* 100*  BUN 19 18 21* 20 23*  CREATININE 1.49* 1.52* 1.75* 1.60* 1.54*  CALCIUM 8.4* 8.4* 8.5* 8.6* 8.7*   GFR: Estimated Creatinine Clearance: 55.4 mL/min (by C-G formula based on SCr of 1.54 mg/dL (H)). Liver Function Tests:  Recent Labs Lab 08/10/16 1750  AST 23  ALT 9*  ALKPHOS 67  BILITOT 0.6  PROT 6.6  ALBUMIN 2.7*   No results for input(s): LIPASE,  AMYLASE in the last 168 hours. No results for input(s): AMMONIA in the last 168 hours. Coagulation Profile: No results for input(s): INR, PROTIME in the last 168 hours. Cardiac Enzymes: No results for input(s): CKTOTAL, CKMB, CKMBINDEX, TROPONINI in the last 168 hours. BNP (last 3 results) No results for input(s): PROBNP in the last 8760 hours. HbA1C: No results for input(s): HGBA1C in the last 72 hours. CBG:  Recent Labs Lab 08/13/16 0541 08/13/16 1202 08/13/16 1630 08/13/16 2237 08/14/16 0542  GLUCAP 118* 128* 137* 177* 101*   Lipid Profile: No results for input(s): CHOL, HDL, LDLCALC, TRIG, CHOLHDL, LDLDIRECT in the last 72 hours. Thyroid Function Tests: No results for input(s): TSH, T4TOTAL, FREET4, T3FREE, THYROIDAB in the last 72 hours. Anemia Panel: No results for input(s): VITAMINB12, FOLATE, FERRITIN, TIBC, IRON, RETICCTPCT in the last 72 hours. Urine analysis:    Component Value Date/Time   COLORURINE YELLOW 05/11/2016 1600   APPEARANCEUR CLEAR 05/11/2016 1600   LABSPEC 1.021 05/11/2016 1600   PHURINE 5.5 05/11/2016 1600   GLUCOSEU NEGATIVE 05/11/2016 1600   GLUCOSEU NEGATIVE 11/07/2013 0754   HGBUR NEGATIVE 05/11/2016 1600   BILIRUBINUR NEGATIVE 05/11/2016 1600   BILIRUBINUR neg 03/08/2015  [place] 05/11/2016 1600   PROTEINUR NEGATIVE 05/11/2016 1600   UROBILINOGEN negative 03/08/2015 1105   UROBILINOGEN 0.2 02/09/2014 1805   NITRITE NEGATIVE 05/11/2016 1600   LEUKOCYTESUR NEGATIVE 05/11/2016 1600     Tykeshia Tourangeau M.D. [facility] 08/14/2016, 3:14 PM  Pager: [phone] Between 7am to 7pm - call Pager - [phone]  After 7pm go to [email or web address] - password [email or web address]  Call night coverage person covering after 7pm

## 2016-08-14 NOTE — Progress Notes (Signed)
Pt ingested givens capsule @ 1500.  Instructions given to patient and bedside RN.    Vista Lawman, RN

## 2016-08-14 NOTE — Progress Notes (Signed)
Pt c/o pain in left hand, raised area noted to left hand w/ unapproximated surrounding skin. Pt stated that an IV was started by EMS during his transfer to the hospital. He stated that he c/o pain in his hand while in the ER and he noticed that the IV had infiltrated. Pt stated that his hand was swollen and the pain in his hand has been continous since the IV infiltrated. Pt asked for pain medication, however informed pt he is NPO for a procedure this morning,after speaking w/ the PA. Pt stated that he is willing to wait on pain meds, will continue to monitor.

## 2016-08-14 NOTE — H&P (View-Only) (Signed)
     [facility] Progress Note  Chief Complaint:   Worsening anemia  Subjective: Feels okay. No overt bleeding. Breathing better after diuresis.   Objective:  Vital signs in last 24 hours: Temp:  [97.7 F (36.5 C)-98.6 F (37 C)] 97.8 F (36.6 C) (01/18 0544) Pulse Rate:  [53-68] 68 (01/18 0900) Resp:  [17-18] 18 (01/18 0544) BP: (149-177)/(53-82) 177/53 (01/18 0900) SpO2:  [96 %-100 %] 96 % (01/18 0544) Weight:  [259 lb 1.6 oz (117.5 kg)] 259 lb 1.6 oz (117.5 kg) (01/18 0544) Last BM Date: 08/12/16 General:   Alert, obese white male  in NAD EENT:  Normal hearing, non icteric sclera, conjunctive pink.  Heart:  Regular rate and rhythm; + murmur, 2+ BLE edema Pulm: Normal respiratory effort, lungs. Abdomen:  Soft, nondistended, nontender.  Normal bowel sounds, no masses felt.  Neurologic:  Alert and  oriented x4;  grossly normal neurologically. Psych:  Alert and cooperative. Normal mood and affect.   Intake/Output from previous day: 01/17 0701 - 01/18 0700 In: 5465 [P.O.:600; I.V.:20; Blood:335; IV Piggyback:500] Out: 4400 [Urine:4400] Intake/Output this shift: Total I/O In: 840 [P.O.:840] Out: 400 [Urine:400]  Lab Results:  Recent Labs  08/12/16 0744 08/12/16 2000 08/13/16 0400  WBC 4.1 4.5 4.1  HGB 7.7* 9.2* 8.8*  HCT 24.3* 29.1* 27.4*  PLT 145* 174 174   BMET  Recent Labs  08/11/16 0309 08/12/16 0452 08/13/16 0400  NA 139 138 142  K 4.0 4.0 3.9  CL 103 101 103  CO2 28 29 31   GLUCOSE 97 202* 122*  BUN 18 21* 20  CREATININE 1.52* 1.75* 1.60*  CALCIUM 8.4* 8.5* 8.6*   LFT  Recent Labs  08/10/16 1750  PROT 6.6  ALBUMIN 2.7*  AST 23  ALT 9*  ALKPHOS 48  BILITOT 0.6    Assessment / Plan:  1. 76 yo male with progression of chronic anemia, FOBT+ on Eliquis,Plavix and ASA  Hgb 12.3 on 07/29/16, down to 8.5 on 08/10/16 and 7.7 yesterday, Received IV iron and a unit of blood yesterday,  hgb up to 8.8 this am. Eliquis on hold. Plavix  ongoing. Stools sometimes day on oral iron -Recent colonoscopy unrevealing..  -Hx of multiple SB AVMs documented on capsule study in 2013.  Plan is for push enteroscopy in am when Eliquis out of system. Reviewed plan with the patient. NPO after midnight  2. CAD,DES placed earlier this month.   3. Acute on chronic diastolic heart failure. Lost several pounds after diuresis. Breathing better.   4. PAF, on amiodarone. Eliquis currently on hold. Regular rate / rhythm this am.   5. MSSA bacteremia, on Cefazoin  6. Recent C-diff. His home vanco is being continued. No diarrhea  Principal Problem:   Acute on chronic diastolic CHF (congestive heart failure) (HCC) Active Problems:   DM (diabetes mellitus), type 2 with peripheral vascular complications (HCC)   Iron deficiency anemia due to chronic blood loss   Morbid obesity due to excess calories (HCC)   Gout   Psoriatic arthritis (HCC)   PAF (paroxysmal atrial fibrillation) ([place])   Essential hypertension   Hypothyroidism   Staphylococcus aureus bacteremia   CAD (coronary artery disease)   History of Clostridium difficile colitis     LOS: 3 days   Tye Savoy NP 08/13/2016, 10:42 AM  Pager number [phone]

## 2016-08-14 NOTE — Transfer of Care (Signed)
Immediate Anesthesia Transfer of Care Note  Patient: Michael [name]  Procedure(s) Performed: Procedure(s): ENTEROSCOPY (N/A)  Patient Location: Endoscopy Unit  Anesthesia Type:MAC  Level of Consciousness: alert , oriented, patient cooperative and responds to stimulation, drowsy  Airway & Oxygen Therapy: Patient Spontanous Breathing and Patient connected to nasal cannula oxygen  Post-op Assessment: Report given to RN and Post -op Vital signs reviewed and stable  Post vital signs: Reviewed and stable  Last Vitals:  Vitals:   08/14/16 0830 08/14/16 0936  BP: (!) 196/50 (!) 135/40  Pulse: (!) 56 (!) 50  Resp: 13 12  Temp: 36.8 C     Last Pain:  Vitals:   08/14/16 0830  TempSrc: Oral  PainSc: 4       Patients Stated Pain Goal: 0 (19/16/60 6004)  Complications: No apparent anesthesia complications

## 2016-08-14 NOTE — Progress Notes (Signed)
Subjective:  Feeling much better . Excellent diuresis (10.3 L). SCr decreasing. S/P Tx 1 U PRBCs. NSR  Objective:  Temp:  [97.7 F (36.5 C)-98.7 F (37.1 C)] 98.2 F (36.8 C) (01/19 0830) Pulse Rate:  [48-56] 50 (01/19 1000) Resp:  [12-18] 14 (01/19 1000) BP: (130-196)/(40-69) 147/43 (01/19 1000) SpO2:  [94 %-98 %] 96 % (01/19 1000) Weight:  [256 lb 3.2 oz (116.2 kg)] 256 lb 3.2 oz (116.2 kg) (01/19 0830) Weight change: -2 lb 14.4 oz (-1.315 kg)  Intake/Output from previous day: 01/18 0701 - 01/19 0700 In: 1660 [P.O.:1280; I.V.:80; IV Piggyback:300] Out: 3875 [Urine:3875]  Intake/Output from this shift: Total I/O In: 150 [I.V.:150] Out: 0   Physical Exam: General appearance: alert and no distress Neck: no adenopathy, no carotid bruit, no JVD, supple, symmetrical, trachea midline and thyroid not enlarged, symmetric, no tenderness/mass/nodules Lungs: clear to auscultation bilaterally Heart: Soft outflow murmur Extremities: Trace Bilat LEE  Lab Results: Results for orders placed or performed during the hospital encounter of 08/10/16 (from the past 48 hour(s))  Glucose, capillary     Status: Abnormal   Collection Time: 08/12/16 11:30 AM  Result Value Ref Range   Glucose-Capillary 167 (H) 65 - 99 mg/dL   Comment 1 Notify RN   Type and screen     Status: None   Collection Time: 08/12/16  1:46 PM  Result Value Ref Range   ABO/RH(D) A POS    Antibody Screen NEG    Sample Expiration 08/15/2016    Unit Number [id number]    Blood Component Type RED CELLS,LR    Unit division 00    Status of Unit ISSUED,FINAL    Transfusion Status OK TO TRANSFUSE    Crossmatch Result Compatible   Prepare RBC     Status: None   Collection Time: 08/12/16  1:46 PM  Result Value Ref Range   Order Confirmation ORDER PROCESSED BY BLOOD BANK   Glucose, capillary     Status: Abnormal   Collection Time: 08/12/16  4:44 PM  Result Value Ref Range   Glucose-Capillary 142 (H) 65 - 99 mg/dL    Comment 1 Notify RN   CBC     Status: Abnormal   Collection Time: 08/12/16  8:00 PM  Result Value Ref Range   WBC 4.5 4.0 - 10.5 K/uL   RBC 3.24 (L) 4.22 - 5.81 MIL/uL   Hemoglobin 9.2 (L) 13.0 - 17.0 g/dL   HCT 29.1 (L) 39.0 - 52.0 %   MCV 89.8 78.0 - 100.0 fL   MCH 28.4 26.0 - 34.0 pg   MCHC 31.6 30.0 - 36.0 g/dL   RDW 19.1 (H) 11.5 - 15.5 %   Platelets 174 150 - 400 K/uL  Glucose, capillary     Status: Abnormal   Collection Time: 08/12/16 10:27 PM  Result Value Ref Range   Glucose-Capillary 149 (H) 65 - 99 mg/dL  Basic metabolic panel     Status: Abnormal   Collection Time: 08/13/16  4:00 AM  Result Value Ref Range   Sodium 142 135 - 145 mmol/L   Potassium 3.9 3.5 - 5.1 mmol/L   Chloride 103 101 - 111 mmol/L   CO2 31 22 - 32 mmol/L   Glucose, Bld 122 (H) 65 - 99 mg/dL   BUN 20 6 - 20 mg/dL   Creatinine, Ser 1.60 (H) 0.61 - 1.24 mg/dL   Calcium 8.6 (L) 8.9 - 10.3 mg/dL   GFR calc non Af Amer 41 (  L) >60 mL/min   GFR calc Af Amer 48 (L) >60 mL/min    Comment: (NOTE) The eGFR has been calculated using the CKD EPI equation. This calculation has not been validated in all clinical situations. eGFR's persistently <60 mL/min signify possible Chronic Kidney Disease.    Anion gap 8 5 - 15  CBC     Status: Abnormal   Collection Time: 08/13/16  4:00 AM  Result Value Ref Range   WBC 4.1 4.0 - 10.5 K/uL   RBC 3.01 (L) 4.22 - 5.81 MIL/uL   Hemoglobin 8.8 (L) 13.0 - 17.0 g/dL   HCT 27.4 (L) 39.0 - 52.0 %   MCV 91.0 78.0 - 100.0 fL   MCH 29.2 26.0 - 34.0 pg   MCHC 32.1 30.0 - 36.0 g/dL   RDW 19.0 (H) 11.5 - 15.5 %   Platelets 174 150 - 400 K/uL  Glucose, capillary     Status: Abnormal   Collection Time: 08/13/16  5:41 AM  Result Value Ref Range   Glucose-Capillary 118 (H) 65 - 99 mg/dL   Comment 1 Notify RN    Comment 2 Document in Chart   Glucose, capillary     Status: Abnormal   Collection Time: 08/13/16 12:02 PM  Result Value Ref Range   Glucose-Capillary 128 (H) 65 -  99 mg/dL   Comment 1 Notify RN   Glucose, capillary     Status: Abnormal   Collection Time: 08/13/16  4:30 PM  Result Value Ref Range   Glucose-Capillary 137 (H) 65 - 99 mg/dL   Comment 1 Notify RN   Glucose, capillary     Status: Abnormal   Collection Time: 08/13/16 10:37 PM  Result Value Ref Range   Glucose-Capillary 177 (H) 65 - 99 mg/dL   Comment 1 Notify RN    Comment 2 Document in Chart   CBC     Status: Abnormal   Collection Time: 08/14/16  4:35 AM  Result Value Ref Range   WBC 3.4 (L) 4.0 - 10.5 K/uL   RBC 3.14 (L) 4.22 - 5.81 MIL/uL   Hemoglobin 9.0 (L) 13.0 - 17.0 g/dL   HCT 28.4 (L) 39.0 - 52.0 %   MCV 90.4 78.0 - 100.0 fL   MCH 28.7 26.0 - 34.0 pg   MCHC 31.7 30.0 - 36.0 g/dL   RDW 19.1 (H) 11.5 - 15.5 %   Platelets 156 150 - 400 K/uL  Basic metabolic panel     Status: Abnormal   Collection Time: 08/14/16  4:35 AM  Result Value Ref Range   Sodium 139 135 - 145 mmol/L   Potassium 3.9 3.5 - 5.1 mmol/L   Chloride 99 (L) 101 - 111 mmol/L   CO2 30 22 - 32 mmol/L   Glucose, Bld 100 (H) 65 - 99 mg/dL   BUN 23 (H) 6 - 20 mg/dL   Creatinine, Ser 1.54 (H) 0.61 - 1.24 mg/dL   Calcium 8.7 (L) 8.9 - 10.3 mg/dL   GFR calc non Af Amer 43 (L) >60 mL/min   GFR calc Af Amer 50 (L) >60 mL/min    Comment: (NOTE) The eGFR has been calculated using the CKD EPI equation. This calculation has not been validated in all clinical situations. eGFR's persistently <60 mL/min signify possible Chronic Kidney Disease.    Anion gap 10 5 - 15  Glucose, capillary     Status: Abnormal   Collection Time: 08/14/16  5:42 AM  Result Value Ref Range  Glucose-Capillary 101 (H) 65 - 99 mg/dL    Imaging: Imaging results have been reviewed  Tele- SB  Assessment/Plan:   1. Principal Problem: 2.   Acute on chronic diastolic CHF (congestive heart failure) ([place]) 3. Active Problems: 4.   DM (diabetes mellitus), type 2 with peripheral vascular complications ([place]) 5.   Iron deficiency anemia  due to chronic blood loss 6.   Morbid obesity due to excess calories ([place]) 7.   Gout 8.   Psoriatic arthritis ([place]) 9.   PAF (paroxysmal atrial fibrillation) ([place]) 10.   Essential hypertension 11.   Hypothyroidism 12.   Staphylococcus aureus bacteremia 13.   CAD (coronary artery disease) 14.   History of Clostridium difficile colitis 15.   Congestive heart failure ([place]) 16.   Time Spent Directly with Patient:  20 minutes  Length of Stay:  LOS: 4 days   Looks great. I/O neg 10.3 L. Only trace BLEE left. HGB stable 9 after 1 U pRBCs. Maintaining NRS/SB. Would keep off of Eliquis. GI eval in progress. Can prob transition to PO lasix. Home over weekend.  Quay Burow 08/14/2016, 10:42 AM

## 2016-08-14 NOTE — Anesthesia Preprocedure Evaluation (Addendum)
Anesthesia Evaluation  Patient identified by MRN, date of birth, ID band Patient awake    Reviewed: Allergy & Precautions, NPO status , reviewed documented beta blocker date and time   Airway Mallampati: II  TM Distance: >3 FB     Dental   Pulmonary shortness of breath, former smoker,    breath sounds clear to auscultation       Cardiovascular hypertension, + CAD, + Peripheral Vascular Disease and +CHF  + dysrhythmias  Rhythm:Regular Rate:Normal     Neuro/Psych  Neuromuscular disease    GI/Hepatic GERD  ,  Endo/Other  diabetesHypothyroidism   Renal/GU Renal disease     Musculoskeletal  (+) Arthritis ,   Abdominal   Peds  Hematology  (+) anemia ,   Anesthesia Other Findings   Reproductive/Obstetrics                             Anesthesia Physical Anesthesia Plan  ASA: III  Anesthesia Plan: General   Post-op Pain Management:    Induction: Intravenous  Airway Management Planned: Simple Face Mask and Mask  Additional Equipment:   Intra-op Plan:   Post-operative Plan:   Informed Consent: I have reviewed the patients History and Physical, chart, labs and discussed the procedure including the risks, benefits and alternatives for the proposed anesthesia with the patient or authorized representative who has indicated his/her understanding and acceptance.   Dental advisory given  Plan Discussed with: CRNA and Anesthesiologist  Anesthesia Plan Comments:        Anesthesia Quick Evaluation

## 2016-08-14 NOTE — Anesthesia Procedure Notes (Signed)
Procedure Name: MAC Date/Time: 08/14/2016 8:59 AM Performed by: Everlean Cherry A Pre-anesthesia Checklist: Patient identified, Emergency Drugs available, Suction available and Patient being monitored Patient Re-evaluated:Patient Re-evaluated prior to inductionOxygen Delivery Method: Nasal cannula

## 2016-08-14 NOTE — Interval H&P Note (Signed)
History and Physical Interval Note:  08/14/2016 8:46 AM  Michael [name]  has presented today for surgery, with the diagnosis of anemia.  FOBT + stool.  The various methods of treatment have been discussed with the patient and family. After consideration of risks, benefits and other options for treatment, the patient has consented to  Procedure(s): ENTEROSCOPY (N/A) as a surgical intervention .  The patient's history has been reviewed, patient examined, no change in status, stable for surgery.  I have reviewed the patient's chart and labs.  Questions were answered to the patient's satisfaction.     Renelda Loma Darrell Leonhardt

## 2016-08-15 ENCOUNTER — Encounter (HOSPITAL_COMMUNITY): Payer: Self-pay | Admitting: Gastroenterology

## 2016-08-15 DIAGNOSIS — D500 Iron deficiency anemia secondary to blood loss (chronic): Secondary | ICD-10-CM

## 2016-08-15 DIAGNOSIS — N183 Chronic kidney disease, stage 3 (moderate): Secondary | ICD-10-CM

## 2016-08-15 LAB — CBC
HEMATOCRIT: 28.4 % — AB (ref 39.0–52.0)
HEMOGLOBIN: 8.9 g/dL — AB (ref 13.0–17.0)
MCH: 28.4 pg (ref 26.0–34.0)
MCHC: 31.3 g/dL (ref 30.0–36.0)
MCV: 90.7 fL (ref 78.0–100.0)
Platelets: 149 10*3/uL — ABNORMAL LOW (ref 150–400)
RBC: 3.13 MIL/uL — ABNORMAL LOW (ref 4.22–5.81)
RDW: 18.8 % — AB (ref 11.5–15.5)
WBC: 2.8 10*3/uL — AB (ref 4.0–10.5)

## 2016-08-15 LAB — BASIC METABOLIC PANEL
ANION GAP: 8 (ref 5–15)
BUN: 20 mg/dL (ref 6–20)
CALCIUM: 8.6 mg/dL — AB (ref 8.9–10.3)
CO2: 27 mmol/L (ref 22–32)
CREATININE: 1.37 mg/dL — AB (ref 0.61–1.24)
Chloride: 105 mmol/L (ref 101–111)
GFR calc non Af Amer: 50 mL/min — ABNORMAL LOW (ref >60)
GFR, EST AFRICAN AMERICAN: 57 mL/min — AB (ref >60)
Glucose, Bld: 99 mg/dL (ref 65–99)
Potassium: 4 mmol/L (ref 3.5–5.1)
Sodium: 140 mmol/L (ref 135–145)

## 2016-08-15 LAB — GLUCOSE, CAPILLARY
GLUCOSE-CAPILLARY: 127 mg/dL — AB (ref 65–99)
GLUCOSE-CAPILLARY: 186 mg/dL — AB (ref 65–99)
GLUCOSE-CAPILLARY: 156 mg/dL — AB (ref 65–99)
Glucose-Capillary: 167 mg/dL — ABNORMAL HIGH (ref 65–99)

## 2016-08-15 MED ORDER — FUROSEMIDE 40 MG PO TABS
60.0000 mg | ORAL_TABLET | Freq: Every day | ORAL | Status: DC
  Administered 2016-08-15 – 2016-08-16 (×2): 60 mg via ORAL
  Filled 2016-08-15 (×2): qty 1

## 2016-08-15 NOTE — Progress Notes (Signed)
[facility]                                                                              Patient Demographics  Michael [name], is a 76 y.o. male, DOB - January 08, 1941, EKC:[id number]  Admit date - 08/10/2016   Admitting Physician Edwin Dada, MD  Outpatient Primary MD for the patient is [name], MD  Outpatient specialists:   LOS - 5  days    Chief Complaint  Patient presents with  . Shortness of Breath  . Wound Infection       Brief summary   Michael Ditmer Jonesis a 76 y.o.malewith a past medical history significant for HFpEF, psoriasis on Humira, CKD III baseline Cr 1.4, IDDM, iron def anemia, pAF on apixaban, hypothyoridism, HTN, and CAD with recent DES to [facility] presents with 1 week swelling and now dyspnea/orthopnea. Admitted with CHF exacerbation started on IV lasix     Assessment & Plan    Principal Problem:   Acute on chronic diastolic CHF (congestive heart failure) (HCC) - 2-D echo 1/6 showed EF of 60-65% with grade 2 diastolic dysfunction, PA pressure 53 - Per patient during previous hospitalization, due to AK I, creatinine worsened to 2.0 and hence Lasix was held, gained 17 lbs during his hospitalization. Per patient normal dry weight of ~240lbs, on 1/1 was 260lbs  - Placed on IV diuresis, net negative balance of 13 L, weight down from 275 lbs -> 250 lbs  - Continue to hold Avapro, cardiology following, creatinine stable - Lasix transitioned to oral today  Active Problems: Paroxysmal atrial fibrillation - CHADSVASc 5 on Eliquis  - On triple therapy due to recent stent, on aspirin, Plavix, and eliquis - cont to hold Eliquis given anemia and + FOBT  - Hemoglobin down to 7.7 on 1/17, hence transfused 1 unit packed RBC, hemoglobin 9.0 - Gastroenterology consulted, endoscopy normal, capsule endoscopy in process - Continue diltiazem, amiodarone and BB  Acute on Chronic iron deficiency anemia  - H&H currently stable - enteroscopy  normal, capsule endoscopy in process  CAD -  -s/p DES on 1/3 on triple therapy for 1 month - then Plavix and Eliquis - asymptomatic  -- Continue ASA/Plavix for now - Continue BB and ARB   MSSA Bacteremia  - Continue Cefazolin   History of C. difficile colitis - Continue oral vancomycin  CKD stage III - Cr at baseline  - Creatinine stable  -  may need to transition to oral Lasix in a.m.   Psoariasis - stable Previously on Humira - last dose on 06/2016  Code Status: FULL DVT Prophylaxis:  SCD's Family Communication: Discussed in detail with the patient, all imaging results, lab results explained to the patient   Disposition Plan: Stable from cardiac standpoint for dc soon, awaiting GI recommendations regarding disposition and capsule endoscopy results  Time Spent in minutes   25 minutes  Procedures:  None   Consultants:   Cardiology GI  Antimicrobials:   Cefazolin  Oral vancomycin   Medications  Scheduled Meds: . allopurinol  200 mg Oral Daily  . amiodarone  200 mg Oral Daily  . aspirin  81 mg Oral Daily  . atorvastatin  40 mg Oral q1800  .  ceFAZolin (ANCEF) IV  2 g Intravenous Q8H  . clopidogrel  75 mg Oral Q breakfast  . diltiazem  180 mg Oral Daily  . fenofibrate  160 mg Oral Daily  . ferrous sulfate  325 mg Oral TID WC  . furosemide  60 mg Oral Daily  . hydrALAZINE  25 mg Oral TID  . insulin aspart  0-15 Units Subcutaneous TID WC  . insulin aspart  0-5 Units Subcutaneous QHS  . insulin glargine  52 Units Subcutaneous Daily  . levothyroxine  150 mcg Oral QAC breakfast  . metoprolol tartrate  75 mg Oral BID  . pantoprazole  40 mg Oral Daily  . peg 3350 powder  1 kit Oral Once  . potassium chloride  20 mEq Oral Daily  . vancomycin  125 mg Oral Q6H   Continuous Infusions: PRN Meds:.HYDROcodone-acetaminophen, ondansetron **OR** ondansetron (ZOFRAN) IV, sodium chloride flush   Antibiotics   Anti-infectives    Start     Dose/Rate Route Frequency  Ordered Stop   08/11/16 1400  ceFAZolin (ANCEF) IVPB 2g/100 mL premix     2 g 200 mL/hr over 30 Minutes Intravenous Every 8 hours 08/11/16 0827     08/11/16 0000  vancomycin (VANCOCIN) 50 mg/mL oral solution 125 mg     125 mg Oral Every 6 hours 08/10/16 2304     08/10/16 2315  ceFAZolin (ANCEF) IVPB  Status:  Discontinued    Comments:  Indication:  Left wrist septic arthritis with MSSA bacteremia Last Day of Therapy:  08/18/16 Labs - Once weekly:  CBC/D and BMP, Labs - Every other week:  ESR and CRP     2 g Intravenous Every 8 hours 08/10/16 2304 08/10/16 2312   08/10/16 2315  ceFAZolin (ANCEF) IVPB 1 g/50 mL premix  Status:  Discontinued     1 g 100 mL/hr over 30 Minutes Intravenous Every 8 hours 08/10/16 2313 08/11/16 0827        Subjective:   Michael [name] was seen and examined today. No complaints. Hoping to be discharged soon. Shortness of breath is improving. Patient denies dizziness, chest pain, abdominal pain, N/V/D/C, new weakness, numbess, tingling. No acute events overnight.    Objective:   Vitals:   08/14/16 1923 08/15/16 0610 08/15/16 1033 08/15/16 1220  BP: (!) 181/70 (!) 144/60 (!) 159/50 (!) 170/60  Pulse: 69 60 61 62  Resp: 18 20  20   Temp: 97.9 F (36.6 C) 97.5 F (36.4 C)  98 F (36.7 C)  TempSrc: Oral Oral  Oral  SpO2: 99% 98%  98%  Weight:  113.6 kg (250 lb 8 oz)    Height:        Intake/Output Summary (Last 24 hours) at 08/15/16 1233 Last data filed at 08/15/16 1155  Gross per 24 hour  Intake              240 ml  Output             2150 ml  Net            -1910 ml     Wt Readings from Last 3 Encounters:  08/15/16 113.6 kg (250 lb 8 oz)  08/04/16 123.5 kg (272 lb 4.3 oz)  07/07/16 117.9 kg (260 lb)     Exam  General: Alert and oriented x 3, NAD  HEENT:    Neck:   Cardiovascular: S1 S2 auscultated, Irregularly  irregular  Respiratory: fairly CTAB  Gastrointestinal: Soft, NT, ND, NBS   Ext: no cyanosis clubbing, trace  edema  Neuro: No new deficits  Skin: No rashes  Psych: Normal affect and demeanor, alert and oriented x3    Data Reviewed:  I have personally reviewed following labs and imaging studies  Micro Results No results found for this or any previous visit (from the past 240 hour(s)).  Radiology Reports Dg Chest 2 View  Result Date: 08/10/2016 CLINICAL DATA:  Shortness breath.  17 pound weight gain in 2 weeks. EXAM: CHEST  2 VIEW COMPARISON:  One-view chest x-ray 08/03/2016. FINDINGS: The heart is enlarged. Atherosclerotic calcifications are present in the aorta. A right-sided PICC line is stable. New bilateral pleural effusions and basilar airspace disease are present. Interstitial edema has increased. No other significant airspace consolidation is present. Vertebral body heights and alignment are maintained. IMPRESSION: 1. Cardiomegaly with increasing interstitial edema and bilateral pleural effusions compatible with congestive heart failure. 2. Aortic atherosclerosis. Electronically Signed   By: San Morelle M.D.   On: 08/10/2016 17:46   Dg Chest 2 View  Result Date: 07/27/2016 CLINICAL DATA:  Generalized chest pressure today.  Recent pneumonia. EXAM: CHEST  2 VIEW COMPARISON:  Chest radiograph July 07, 2016 FINDINGS: Cardiomediastinal silhouette is normal. Mildly calcified aortic knob. Patchy RIGHT middle lobe airspace opacity. Linear densities project LEFT mid and lower lung zone. No pleural effusion. No pneumothorax. Soft tissue planes included osseous structures are nonsuspicious. IMPRESSION: Similar patchy RIGHT middle lobe and lingular/LEFT lung base atelectasis/scarring. Electronically Signed   By: Elon Alas M.D.   On: 07/27/2016 02:21   Dg Wrist Complete Left  Result Date: 07/30/2016 CLINICAL DATA:  Swelling and bulging wound along the central posterior wrist EXAM: LEFT WRIST - COMPLETE 3+ VIEW COMPARISON:  06/06/2008 FINDINGS: No acute fracture or dislocation. Moderate  osteoarthritis of the radiocarpal joint. Mild osteoarthritis of the scaphotrapeziotrapezoid joint. Mild osteoarthritis of the first [place] joint. Moderate osteoarthritis of the first [place] joint. Periarticular erosions involving the first metacarpal head as can be seen with gout. Soft tissue swelling along the dorsal aspect the hand which may represent a small hematoma given the patient's history. Peripheral vascular atherosclerotic disease. IMPRESSION: 1.  No acute osseous injury of the left hand. 2. Periarticular erosions involving the first metacarpal head as can be seen with gout. Electronically Signed   By: Kathreen Devoid   On: 07/30/2016 09:39   Dg Chest Port 1 View  Result Date: 08/03/2016 CLINICAL DATA:  Evaluate central venous catheter placement EXAM: PORTABLE CHEST 1 VIEW COMPARISON:  08/03/2016 FINDINGS: There is a right arm PICC line with tip in the projection of the distal SVC. The heart size is moderately enlarged. No pleural effusion or edema. No airspace opacities. IMPRESSION: 1. Right arm PICC line tip projects over the distal SVC. Electronically Signed   By: Kerby Moors M.D.   On: 08/03/2016 16:42   Dg Chest Port 1 View  Result Date: 08/03/2016 CLINICAL DATA:  PICC line placement. EXAM: PORTABLE CHEST 1 VIEW COMPARISON:  07/30/2016 and 07/27/2016. FINDINGS: 1348 hours. Right arm PICC projects to the mid SVC level. The catheter appears to extend into the azygos vein, suboptimally evaluated on this single AP view. Stable cardiomegaly and aortic atherosclerosis. There is mild atelectasis at both lung bases. No confluent airspace opacity, pneumothorax or significant pleural effusion. IMPRESSION: Right arm PICC appears to extend into the azygos vein on this single portable AP examination. Catheter repositioning prior to  use and radiographic follow up recommended. These results will be called to the ordering clinician or representative by the Radiologist Assistant, and communication documented in the  PACS or zVision Dashboard. Electronically Signed   By: Richardean Sale M.D.   On: 08/03/2016 14:06   Dg Chest Port 1 View  Result Date: 07/30/2016 CLINICAL DATA:  Increasing shortness of Breath EXAM: PORTABLE CHEST 1 VIEW COMPARISON:  07/27/2016 FINDINGS: Cardiac shadow remains enlarged. The lungs are again well aerated without focal infiltrate. Very mild vascular congestion is seen without interstitial edema. No bony abnormality is noted. IMPRESSION: Mild vascular prominence without interstitial edema. Electronically Signed   By: Inez Catalina M.D.   On: 07/30/2016 13:58   US Abdomen Limited Ruq  Result Date: 07/27/2016 CLINICAL DATA:  Right upper quadrant pain and chest pain, 4 hours duration. EXAM: US ABDOMEN LIMITED - RIGHT UPPER QUADRANT COMPARISON:  None. FINDINGS: Gallbladder: Multiple mobile calculi, measuring up to 1.2 cm. No gallbladder wall thickening or pericholecystic fluid. The patient was not tender over the gallbladder. Common bile duct: Diameter: 3 mm Liver: Mild coarsening of hepatic echotexture without discrete lesion. IMPRESSION: Cholelithiasis without sonographic evidence of acute cholecystitis. Electronically Signed   By: Andreas Newport M.D.   On: 07/27/2016 03:55    Lab Data:  CBC:  Recent Labs Lab 08/10/16 1750  08/12/16 0744 08/12/16 2000 08/13/16 0400 08/14/16 0435 08/15/16 0430  WBC 6.5  < > 4.1 4.5 4.1 3.4* 2.8*  NEUTROABS 5.4  --   --   --   --   --   --   HGB 8.5*  < > 7.7* 9.2* 8.8* 9.0* 8.9*  HCT 27.1*  < > 24.3* 29.1* 27.4* 28.4* 28.4*  MCV 91.6  < > 91.4 89.8 91.0 90.4 90.7  PLT 191  < > 145* 174 174 156 149*  < > = values in this interval not displayed. Basic Metabolic Panel:  Recent Labs Lab 08/11/16 0309 08/12/16 0452 08/13/16 0400 08/14/16 0435 08/15/16 0430  NA 139 138 142 139 140  K 4.0 4.0 3.9 3.9 4.0  CL 103 101 103 99* 105  CO2 28 29 31 30 27   GLUCOSE 97 202* 122* 100* 99  BUN 18 21* 20 23* 20  CREATININE 1.52* 1.75* 1.60* 1.54*  1.37*  CALCIUM 8.4* 8.5* 8.6* 8.7* 8.6*   GFR: Estimated Creatinine Clearance: 61.5 mL/min (by C-G formula based on SCr of 1.37 mg/dL (H)). Liver Function Tests:  Recent Labs Lab 08/10/16 1750  AST 23  ALT 9*  ALKPHOS 67  BILITOT 0.6  PROT 6.6  ALBUMIN 2.7*   No results for input(s): LIPASE, AMYLASE in the last 168 hours. No results for input(s): AMMONIA in the last 168 hours. Coagulation Profile: No results for input(s): INR, PROTIME in the last 168 hours. Cardiac Enzymes: No results for input(s): CKTOTAL, CKMB, CKMBINDEX, TROPONINI in the last 168 hours. BNP (last 3 results) No results for input(s): PROBNP in the last 8760 hours. HbA1C: No results for input(s): HGBA1C in the last 72 hours. CBG:  Recent Labs Lab 08/14/16 1730 08/14/16 1757 08/14/16 2130 08/15/16 0702 08/15/16 1151  GLUCAP 73 93 72 127* 167*   Lipid Profile: No results for input(s): CHOL, HDL, LDLCALC, TRIG, CHOLHDL, LDLDIRECT in the last 72 hours. Thyroid Function Tests: No results for input(s): TSH, T4TOTAL, FREET4, T3FREE, THYROIDAB in the last 72 hours. Anemia Panel: No results for input(s): VITAMINB12, FOLATE, FERRITIN, TIBC, IRON, RETICCTPCT in the last 72 hours. Urine analysis:  Component Value Date/Time   COLORURINE YELLOW 05/11/2016 1600   APPEARANCEUR CLEAR 05/11/2016 1600   LABSPEC 1.021 05/11/2016 1600   PHURINE 5.5 05/11/2016 1600   GLUCOSEU NEGATIVE 05/11/2016 1600   GLUCOSEU NEGATIVE 11/07/2013 0754   HGBUR NEGATIVE 05/11/2016 1600   BILIRUBINUR NEGATIVE 05/11/2016 1600   BILIRUBINUR neg 03/08/2015 1105   KETONESUR NEGATIVE 05/11/2016 1600   PROTEINUR NEGATIVE 05/11/2016 1600   UROBILINOGEN negative 03/08/2015 1105   UROBILINOGEN 0.2 02/09/2014 1805   NITRITE NEGATIVE 05/11/2016 1600   LEUKOCYTESUR NEGATIVE 05/11/2016 1600     Marlys Stegmaier M.D. [facility] 08/15/2016, 12:33 PM  Pager: [phone] Between 7am to 7pm - call Pager - [phone]  After 7pm go to  [email or web address] - password [email or web address]  Call night coverage person covering after 7pm

## 2016-08-15 NOTE — Progress Notes (Signed)
Patient stable overnight. No acute changes to report. Patient rested well. Complaints of pain addressed. Capsule endoscopy study still in progress. Patient is tolerating well.

## 2016-08-15 NOTE — Progress Notes (Signed)
Progress Note  Patient Name: Michael [name] Date of Encounter: 08/15/2016  Primary Cardiologist: Dr. Jenkins Rouge  Subjective   No chest pain or shortness of breath. Much improved leg edema.  Inpatient Medications    Scheduled Meds: . allopurinol  200 mg Oral Daily  . amiodarone  200 mg Oral Daily  . aspirin  81 mg Oral Daily  . atorvastatin  40 mg Oral q1800  .  ceFAZolin (ANCEF) IV  2 g Intravenous Q8H  . clopidogrel  75 mg Oral Q breakfast  . diltiazem  180 mg Oral Daily  . fenofibrate  160 mg Oral Daily  . ferrous sulfate  325 mg Oral TID WC  . furosemide  40 mg Intravenous BID  . hydrALAZINE  25 mg Oral TID  . insulin aspart  0-15 Units Subcutaneous TID WC  . insulin aspart  0-5 Units Subcutaneous QHS  . insulin glargine  52 Units Subcutaneous Daily  . levothyroxine  150 mcg Oral QAC breakfast  . metoprolol tartrate  75 mg Oral BID  . pantoprazole  40 mg Oral Daily  . peg 3350 powder  1 kit Oral Once  . potassium chloride  20 mEq Oral Daily  . vancomycin  125 mg Oral Q6H    PRN Meds: HYDROcodone-acetaminophen, ondansetron **OR** ondansetron (ZOFRAN) IV, sodium chloride flush   Vital Signs    Vitals:   08/14/16 1450 08/14/16 1923 08/15/16 0610 08/15/16 1033  BP:  (!) 181/70 (!) 144/60 (!) 159/50  Pulse:  69 60 61  Resp:  18 20   Temp:  97.9 F (36.6 C) 97.5 F (36.4 C)   TempSrc:  Oral Oral   SpO2:  99% 98%   Weight: 256 lb 3.2 oz (116.2 kg)  250 lb 8 oz (113.6 kg)   Height: 5' 11"  (1.803 m)       Intake/Output Summary (Last 24 hours) at 08/15/16 1157 Last data filed at 08/15/16 1155  Gross per 24 hour  Intake              240 ml  Output             2150 ml  Net            -1910 ml   Filed Weights   08/14/16 0830 08/14/16 1450 08/15/16 0610  Weight: 256 lb 3.2 oz (116.2 kg) 256 lb 3.2 oz (116.2 kg) 250 lb 8 oz (113.6 kg)    Telemetry    I personally reviewed telemetry which shows sinus rhythm.  ECG    I personally reviewed the tracing  from 08/10/2016 which shows sinus rhythm with nonspecific ST-T changes.  Physical Exam   GEN: Obese male. No acute distress.  Neck: No JVD Cardiac: RRR, no murmurs, rubs, or gallops.  Respiratory: Clear to auscultation bilaterally. GI: Soft, nontender, non-distended  MS: Trace leg edema and venous stasis; No deformity.  Labs    Chemistry Recent Labs Lab 08/10/16 1750  08/13/16 0400 08/14/16 0435 08/15/16 0430  NA 138  < > 142 139 140  K 4.5  < > 3.9 3.9 4.0  CL 104  < > 103 99* 105  CO2 26  < > 31 30 27   GLUCOSE 124*  < > 122* 100* 99  BUN 19  < > 20 23* 20  CREATININE 1.49*  < > 1.60* 1.54* 1.37*  CALCIUM 8.4*  < > 8.6* 8.7* 8.6*  PROT 6.6  --   --   --   --  ALBUMIN 2.7*  --   --   --   --   AST 23  --   --   --   --   ALT 9*  --   --   --   --   ALKPHOS 67  --   --   --   --   BILITOT 0.6  --   --   --   --   GFRNONAA 45*  < > 41* 43* 50*  GFRAA 52*  < > 48* 50* 57*  ANIONGAP 8  < > 8 10 8   < > = values in this interval not displayed.   Hematology Recent Labs Lab 08/13/16 0400 08/14/16 0435 08/15/16 0430  WBC 4.1 3.4* 2.8*  RBC 3.01* 3.14* 3.13*  HGB 8.8* 9.0* 8.9*  HCT 27.4* 28.4* 28.4*  MCV 91.0 90.4 90.7  MCH 29.2 28.7 28.4  MCHC 32.1 31.7 31.3  RDW 19.0* 19.1* 18.8*  PLT 174 156 149*    Cardiac EnzymesNo results for input(s): TROPONINI in the last 168 hours.  Recent Labs Lab 08/10/16 1808  TROPIPOC 0.04     BNP Recent Labs Lab 08/10/16 1941  BNP 468.1*     Radiology    No results found.  Cardiac Studies   Echocardiogram 08/01/2016: Study Conclusions  - Left ventricle: The cavity size was normal. Wall thickness was   increased in a pattern of severe LVH. Systolic function was   normal. The estimated ejection fraction was in the range of 60%   to 65%. Wall motion was normal; there were no regional wall   motion abnormalities. Features are consistent with a pseudonormal   left ventricular filling pattern, with concomitant  abnormal   relaxation and increased filling pressure (grade 2 diastolic   dysfunction). Doppler parameters are consistent with high   ventricular filling pressure. - Aortic valve: Valve mobility was restricted. There was moderate   stenosis. - Mitral valve: Calcified annulus. - Left atrium: The atrium was mildly dilated. - Pulmonary arteries: Systolic pressure was moderately increased.   PA peak pressure: 53 mm Hg (S).  Impressions:  - Normal LV systolic function; grade 2 diastolic dysfunction;   severe LVH; calcified aortic valve with moderate AS (mean   gradient 20 mmHg); mild LAE; trace TR; moderately elevated   pulmonary pressure.  Patient Profile     76 y.o. male with history of CAD status post recent DES to the [facility] on January 3, PAF on Eliquis, prior GI bleeding with AVMs, stage III CKD, hypertension, PAD, and hyperlipidemia. He has had significant diuresis on IV Lasix with improving renal function. Maintaining sinus rhythm at this point. Eliquis held in the setting of GI bleed with anemia. Capsule endoscopy pending.  Assessment & Plan    1. Chronic diastolic heart failure with LVEF 60-65% and moderate diastolic dysfunction. He has had a good diuresis on IV Lasix. Clinically improved.  2. CAD status post recent DES to the [facility] on January 3. Continues on aspirin and Plavix.  3. PAF, maintaining sinus rhythm. CHADSVASC score is 5. Eliquis held in the setting of GI blood loss.  4. CKD stage III, creatinine down to 1.4.  Continue aspirin, Plavix, amiodarone, Lipitor, Cardizem CD, fenofibrate, hydralazine, and Lopressor. Will change to oral Lasix today and continue potassium supplements. Stable for discharge soon from a cardiac perspective.  Signed, Rozann Lesches, MD  08/15/2016, 11:57 AM

## 2016-08-15 NOTE — Progress Notes (Signed)
      Progress Note   Subjective  Patient without complaints. No overt evidence of bleeding. Had capsule done overnight, report as below.    Objective   Vital signs in last 24 hours: Temp:  [97.5 F (36.4 C)-98 F (36.7 C)] 98 F (36.7 C) (01/20 1220) Pulse Rate:  [60-62] 62 (01/20 1220) Resp:  [20] 20 (01/20 1220) BP: (144-170)/(50-60) 170/60 (01/20 1220) SpO2:  [98 %] 98 % (01/20 1220) Weight:  [250 lb 8 oz (113.6 kg)] 250 lb 8 oz (113.6 kg) (01/20 0610) Last BM Date: 08/14/16 General:    white male in NAD Exam unchanged from yesterday  Intake/Output from previous day: 01/19 0701 - 01/20 0700 In: 150 [I.V.:150] Out: 1650 [Urine:1650] Intake/Output this shift: No intake/output data recorded.  Lab Results:  Recent Labs  08/13/16 0400 08/14/16 0435 08/15/16 0430  WBC 4.1 3.4* 2.8*  HGB 8.8* 9.0* 8.9*  HCT 27.4* 28.4* 28.4*  PLT 174 156 149*   BMET  Recent Labs  08/13/16 0400 08/14/16 0435 08/15/16 0430  NA 142 139 140  K 3.9 3.9 4.0  CL 103 99* 105  CO2 31 30 27   GLUCOSE 122* 100* 99  BUN 20 23* 20  CREATININE 1.60* 1.54* 1.37*  CALCIUM 8.6* 8.7* 8.6*   LFT No results for input(s): PROT, ALBUMIN, AST, ALT, ALKPHOS, BILITOT, BILIDIR, IBILI in the last 72 hours. PT/INR No results for input(s): LABPROT, INR in the last 72 hours.  Studies/Results: No results found.     Assessment / Plan:   76 y/o male with history of AF on Eliquis and CAD with drug eluting stent placed on 1/3, also on aspirin and plavix who developed acute on chronic anemia in the setting of these medications. 2 colonoscopies done at the end of 2017 without clear etiology. Push enteroscopy yesterday did not reveal a clear source. Capsule endoscopy showed multiple small AVMs in the mid small bowel this is the likely cause of his anemia. Patient has had stable Hgb since coming off Eliquis  Recommend: - need to continue aspirin and plavix, but defer to Cardiology if and when Eliquis is  resumed given cardiovascular risks. He is at high risk for bleeding again if Eliquis is added back to present regimen without underlying treatment of AVMs - due to the diffuse nature of these small AVMs, I would consider octreotide therapy to minimize risk of recurrent bleeding if placed back on Eliquis. Please keep in mind that long term octreotide does have risks to include arrhythmias / prolonged QT, as well as making cholelithiasis worse and risk for bilary complications, (patient has with GB in place with gallstones), when making this decision. Octreotide dosing is subcutaneously (typically 50 mcg twice per day), which is cheaper than the long acting octreotide IM which is given once monthly.  Please call with any additional questions, we will sign off for now.  [employer] Cellar, MD [facility]astro[facility] Pager [phone]

## 2016-08-16 LAB — BASIC METABOLIC PANEL
ANION GAP: 7 (ref 5–15)
BUN: 28 mg/dL — ABNORMAL HIGH (ref 6–20)
CALCIUM: 8.5 mg/dL — AB (ref 8.9–10.3)
CHLORIDE: 103 mmol/L (ref 101–111)
CO2: 27 mmol/L (ref 22–32)
Creatinine, Ser: 1.75 mg/dL — ABNORMAL HIGH (ref 0.61–1.24)
GFR calc non Af Amer: 37 mL/min — ABNORMAL LOW (ref >60)
GFR, EST AFRICAN AMERICAN: 43 mL/min — AB (ref >60)
GLUCOSE: 172 mg/dL — AB (ref 65–99)
Potassium: 4.1 mmol/L (ref 3.5–5.1)
Sodium: 137 mmol/L (ref 135–145)

## 2016-08-16 LAB — GLUCOSE, CAPILLARY
Glucose-Capillary: 203 mg/dL — ABNORMAL HIGH (ref 65–99)
Glucose-Capillary: 141 mg/dL — ABNORMAL HIGH (ref 65–99)

## 2016-08-16 MED ORDER — POTASSIUM CHLORIDE CRYS ER 20 MEQ PO TBCR: 20.0000 meq | EXTENDED_RELEASE_TABLET | Freq: Every day | ORAL | 3 refills | Status: DC

## 2016-08-16 MED ORDER — HYDRALAZINE HCL 25 MG PO TABS: 25.0000 mg | ORAL_TABLET | Freq: Three times a day (TID) | ORAL | 3 refills | Status: DC

## 2016-08-16 MED ORDER — FUROSEMIDE 20 MG PO TABS: 40.0000 mg | ORAL_TABLET | Freq: Every day | ORAL | 3 refills | Status: DC

## 2016-08-16 MED ORDER — HEPARIN SOD (PORK) LOCK FLUSH 100 UNIT/ML IV SOLN
250.0000 [IU] | INTRAVENOUS | Status: DC | PRN
  Administered 2016-08-16: 250 [IU]
  Filled 2016-08-16: qty 3

## 2016-08-16 MED ORDER — HEPARIN SOD (PORK) LOCK FLUSH 100 UNIT/ML IV SOLN
250.0000 [IU] | Freq: Every day | INTRAVENOUS | Status: DC
  Filled 2016-08-16: qty 2.5

## 2016-08-16 MED ORDER — FUROSEMIDE 20 MG PO TABS: 60.0000 mg | ORAL_TABLET | Freq: Every day | ORAL | 3 refills | Status: DC

## 2016-08-16 NOTE — Progress Notes (Signed)
   Progress Note  Patient Name: Michael [name] Date of Encounter: 08/16/2016  Primary Cardiologist: Dr. Jenkins Rouge  Please see full rounding note from yesterday. Patient has been clinically stable from a cardiac perspective, now on all oral regimen. Suggest cutting Lasix back to 40 mg daily with bump in creatinine. GI note reviewed. Capsule study shows multiple small AVMs in the mid small bowel, likely source of his anemia. Would keep off of Eliquis given the high likelihood of rebleeding, stay on aspirin and Plavix with recent DES intervention on January 3. Stable for discharge from a cardiac perspective. He would need follow-up with Dr. Johnsie Cancel or APP in the next 7-10 days.  Signed, Rozann Lesches, MD  08/16/2016, 10:59 AM

## 2016-08-16 NOTE — Progress Notes (Signed)
Patient is discharge to home accompanied by patient's spouse and RN via wheelchair at 01:50 pm. Prescriptions and discharge instructions given . Patient and spouse  verbalizes understanding. All personal belongings given. Telemetry box and IV removed prior to discharge and site in good condition.

## 2016-08-16 NOTE — Discharge Summary (Signed)
Physician Discharge Summary   Patient ID: Michael [name] MRN: [id number] DOB/AGE: May 17, 1941 76 y.o.  Admit date: 08/10/2016 Discharge date: 08/16/2016  Primary Care Physician:  [name], MD  Discharge Diagnoses:    . Acute on chronic diastolic CHF (congestive heart failure) (HCC) Acute on chronic iron deficiency anemia due to AVMs . Morbid obesity due to excess calories ([place]) . Gout . DM (diabetes mellitus), type 2 with peripheral vascular complications ([place]) . Iron deficiency anemia due to chronic blood loss . Psoriatic arthritis ([place]) . PAF (paroxysmal atrial fibrillation) ([place]) . Essential hypertension . Hypothyroidism . CAD (coronary artery disease) . Staphylococcus aureus bacteremia   Consults: Cardiology   Recommendations for Outpatient Follow-up:  1. Patient recommended to follow-up with cardiology within the next 7-10 days,and currently off eliquis  2. Please repeat CBC/BMET at next visit   DIET: *Carb modify diet   Allergies:   Allergies  Allergen Reactions  . Percocet [Oxycodone-Acetaminophen] Nausea And Vomiting  . Invokana [Canagliflozin]     Side effects  . Metformin And Related     Upset stomach     DISCHARGE MEDICATIONS: Current Discharge Medication List    START taking these medications   Details  furosemide (LASIX) 20 MG tablet Take 2 tablets (40 mg total) by mouth daily. Qty: 60 tablet, Refills: 3    hydrALAZINE (APRESOLINE) 25 MG tablet Take 1 tablet (25 mg total) by mouth 3 (three) times daily. Qty: 90 tablet, Refills: 3    potassium chloride SA (K-DUR,KLOR-CON) 20 MEQ tablet Take 1 tablet (20 mEq total) by mouth daily. Qty: 30 tablet, Refills: 3      CONTINUE these medications which have NOT CHANGED   Details  acetaminophen (TYLENOL) 325 MG tablet Take 2 tablets (650 mg total) by mouth every 4 (four) hours as needed for mild pain, fever or headache.    allopurinol (ZYLOPRIM) 100 MG tablet Take 2 tablets (200 mg total) by  mouth daily. Qty: 60 tablet, Refills: 11    amiodarone (PACERONE) 200 MG tablet TAKE 1 TABLET BY MOUTH DAILY Qty: 30 tablet, Refills: 6    aspirin 81 MG chewable tablet Chew 1 tablet (81 mg total) by mouth daily. Qty: 30 tablet, Refills: 0    atorvastatin (LIPITOR) 40 MG tablet Take 1 tablet (40 mg total) by mouth daily. Qty: 90 tablet, Refills: 2    ceFAZolin (ANCEF) IVPB Inject 2 g into the vein every 8 (eight) hours. Indication:  Left wrist septic arthritis with MSSA bacteremia Last Day of Therapy:  08/18/16 Labs - Once weekly:  CBC/D and BMP, Labs - Every other week:  ESR and CRP Qty: 45 Units, Refills: 0    Cholecalciferol 1000 UNITS TBDP Take 1,000-5,000 Units by mouth daily. Takes 1000 units everyday except for on Saturday patient takes 5000 units    clopidogrel (PLAVIX) 75 MG tablet Take 1 tablet (75 mg total) by mouth daily with breakfast. Qty: 30 tablet, Refills: 0    diltiazem (CARDIZEM CD) 180 MG 24 hr capsule Take 1 capsule (180 mg total) by mouth daily. Qty: 30 capsule, Refills: 10    fenofibrate 160 MG tablet TAKE 1 TABLET(160 MG) BY MOUTH DAILY Qty: 90 tablet, Refills: 3    ferrous sulfate 325 (65 FE) MG tablet Take 325 mg by mouth 3 (three) times daily with meals.    glipiZIDE (GLUCOTROL) 10 MG tablet Take 10 mg by mouth daily.    HYDROcodone-acetaminophen (NORCO/VICODIN) 5-325 MG tablet Take 1 tablet by mouth 2 (two) times  daily as needed for moderate pain or severe pain. Qty: 100 tablet, Refills: 0    Insulin Glargine (TOUJEO SOLOSTAR) 300 UNIT/ML SOPN Inject 50 Units into the skin daily. Titrate up by 1 unit every 1-2 days for goal sugars of 120-150 Qty: 6 mL, Refills: 11    levothyroxine (SYNTHROID, LEVOTHROID) 150 MCG tablet TAKE 1 TABLET BY MOUTH DAILY BEFORE BREAKFAST Qty: 90 tablet, Refills: 3    meclizine (ANTIVERT) 12.5 MG tablet Take 1 tablet (12.5 mg total) by mouth 2 (two) times daily as needed for dizziness (do not use for more than  3days). Qty: 6 tablet, Refills: 0   Associated Diagnoses: Dizziness of unknown cause    metoprolol 75 MG TABS Take 75 mg by mouth 2 (two) times daily. Qty: 60 tablet, Refills: 0    omeprazole (PRILOSEC) 40 MG capsule TAKE 1 CAPSULE BY MOUTH EVERY DAY Qty: 90 capsule, Refills: 3    vancomycin (VANCOCIN) 50 mg/mL oral solution Take 2.5 mLs (125 mg total) by mouth every 6 (six) hours. Qty: 210 mL, Refills: 0    Blood Glucose Monitoring Suppl (ONE TOUCH ULTRA 2) W/DEVICE KIT Use as directed Dx E11.9 Qty: 1 each, Refills: 0    Blood Pressure Monitor KIT Use to check blood pressure daily Dx I10 Qty: 1 each, Refills: 0    glucose blood (ONE TOUCH ULTRA TEST) test strip 1 each by Other route 4 (four) times daily. Use to check blood sugar four times a day  Dx: E11.9- insulin Dependant Qty: 120 each, Refills: 5    Insulin Pen Needle 31G X 5 MM MISC 1 Units by Does not apply route 4 (four) times daily. Qty: 150 each, Refills: 11    Lancets (ONETOUCH ULTRASOFT) lancets Use to check blood sugars daily Qty: 30 each, Refills: 11      STOP taking these medications     apixaban (ELIQUIS) 5 MG TABS tablet          Brief H and P: For complete details please refer to admission H and P, but in briefLuther W Jonesis a 76 y.o.malewith a past medical history significant for HFpEF, psoriasis on Humira, CKD III baseline Cr 1.4, IDDM, iron def anemia, pAF on apixaban, hypothyoridism, HTN, and CAD with recent DES to [facility] presents with 1 week swelling and now dyspnea/orthopnea. Admitted with CHF exacerbation started on IV lasix  Hospital Course:  Principal Problem:   Acute on chronic diastolic CHF (congestive heart failure) (HCC) - 2-D echo 1/6 showed EF of 60-65% with grade 2 diastolic dysfunction, PA pressure 53 - Per patient during previous hospitalization, due to AK I, creatinine worsened to 2.0 and hence Lasix was held, gained 17 lbs during his hospitalization. Per patient normal dry  weight of ~240lbs, on 1/1 was 260lbs  - Patient was placed on IV diuresis, net negative balance of 13.9 L, weight down from 275 lbs -> 249 lbs  -Lasix was transitioned to oral and recommended 40 mg daily at the time of discharge. Patient was closely followed by cardiology during hospitalization.  Paroxysmal atrial fibrillation - CHADSVASc 5 on Eliquis PTA - On triple therapy due to recent stent, on aspirin, Plavix, and eliquis - cont to hold Eliquis given anemia and + FOBT  - Hemoglobin down to 7.7 on 1/17, hence transfused 1 unit packed RBC, hemoglobin 9.0 - Gastroenterology was consulted, endoscopy normal, capsule endoscopy showed multiple small AVMs in the mid small bowel likely source of his anemia. Cardiology recommended keep off Eliquis, stay  on aspirin and Plavix with recent DES intervention on Jan 3rd. - Continue diltiazem, amiodarone and BB  Acute on Chronic iron deficiency anemia  - H&H currently stable - Gastroenterology was consulted, endoscopy normal, capsule endoscopy showed multiple small AVMs in the mid small bowel likely source of his anemia. Cardiology recommended keep off Eliquis, stay on aspirin and Plavix with recent DES intervention on Jan 3rd.   CAD -  -s/p DES on 1/3 on triple therapy at the time of admission -- Continue ASA/Plavix for now - Continue BB and ARB   MSSA Bacteremia  - Continue Cefazolin   History of C. difficile colitis - Continue oral vancomycin, last dose on 08/25/16  CKD stage III - Cr at baseline  - Creatinine slightly trended up to 1.7, Lasix titrated down to 40 mg daily  Psoariasis - stable Previously on Humira - last dose on 06/2016  Day of Discharge BP (!) 161/60 (BP Location: Left Arm)   Pulse (!) 51   Temp 97.5 F (36.4 C) (Oral)   Resp 20   Ht '5\' 11"'$  (1.803 m)   Wt 113.3 kg (249 lb 11.2 oz) Comment: scale c  SpO2 100%   BMI 34.83 kg/m   Physical Exam: General: Alert and awake oriented x3 not in any acute  distress. HEENT: anicteric sclera, pupils reactive to light and accommodation CVS: S1-S2 clear no murmur rubs or gallops Chest: clear to auscultation bilaterally, no wheezing rales or rhonchi Abdomen: soft nontender, nondistended, normal bowel sounds Extremities: no cyanosis, clubbing or edema noted bilaterally Neuro: Cranial nerves II-XII intact, no focal neurological deficits   The results of significant diagnostics from this hospitalization (including imaging, microbiology, ancillary and laboratory) are listed below for reference.    LAB RESULTS: Basic Metabolic Panel:  Recent Labs Lab 08/15/16 0430 08/16/16 0433  NA 140 137  K 4.0 4.1  CL 105 103  CO2 27 27  GLUCOSE 99 172*  BUN 20 28*  CREATININE 1.37* 1.75*  CALCIUM 8.6* 8.5*   Liver Function Tests:  Recent Labs Lab 08/10/16 1750  AST 23  ALT 9*  ALKPHOS 67  BILITOT 0.6  PROT 6.6  ALBUMIN 2.7*   No results for input(s): LIPASE, AMYLASE in the last 168 hours. No results for input(s): AMMONIA in the last 168 hours. CBC:  Recent Labs Lab 08/10/16 1750  08/14/16 0435 08/15/16 0430  WBC 6.5  < > 3.4* 2.8*  NEUTROABS 5.4  --   --   --   HGB 8.5*  < > 9.0* 8.9*  HCT 27.1*  < > 28.4* 28.4*  MCV 91.6  < > 90.4 90.7  PLT 191  < > 156 149*  < > = values in this interval not displayed. Cardiac Enzymes: No results for input(s): CKTOTAL, CKMB, CKMBINDEX, TROPONINI in the last 168 hours. BNP: Invalid input(s): POCBNP CBG:  Recent Labs Lab 08/16/16 0616 08/16/16 1141  GLUCAP 203* 141*    Significant Diagnostic Studies:  Dg Chest 2 View  Result Date: 08/10/2016 CLINICAL DATA:  Shortness breath.  17 pound weight gain in 2 weeks. EXAM: CHEST  2 VIEW COMPARISON:  One-view chest x-ray 08/03/2016. FINDINGS: The heart is enlarged. Atherosclerotic calcifications are present in the aorta. A right-sided PICC line is stable. New bilateral pleural effusions and basilar airspace disease are present. Interstitial edema  has increased. No other significant airspace consolidation is present. Vertebral body heights and alignment are maintained. IMPRESSION: 1. Cardiomegaly with increasing interstitial edema and bilateral pleural effusions compatible  with congestive heart failure. 2. Aortic atherosclerosis. Electronically Signed   By: San Morelle M.D.   On: 08/10/2016 17:46    2D ECHO:    Disposition and Follow-up: Discharge Instructions    ([place]) Call MD:  Anytime you have any of the following symptoms: 1) 3 pound weight gain in 24 hours or 5 pounds in 1 week 2) shortness of breath, with or without a dry hacking cough 3) swelling in the hands, feet or stomach 4) if you have to sleep on extra pillows at night in order to breathe.    Complete by:  As directed    Diet Carb Modified    Complete by:  As directed    Discharge instructions    Complete by:  As directed    Please HOLD eliquis until further instructions from your cardiologist, Dr Johnsie Cancel   Increase activity slowly    Complete by:  As directed        [place], MD. Schedule an appointment as soon as possible for a visit in 2 week(s).   Specialty:  Internal Medicine Contact information: [place] [address] [phone]        Jenkins Rouge, MD. Schedule an appointment as soon as possible for a visit in 7 day(s).   Specialty:  Cardiology Why:  please call on Monday for appt in 7-10 days  Contact information: [address] [place] [address] [phone]            Time spent on Discharge: 12mns   Signed:   Imran Nuon M.D. [facility] 08/16/2016, 12:57 PM Pager: [phone]

## 2016-08-17 ENCOUNTER — Telehealth: Payer: Self-pay | Admitting: *Deleted

## 2016-08-17 NOTE — Anesthesia Postprocedure Evaluation (Signed)
Anesthesia Post Note  Patient: Michael [name]  Procedure(s) Performed: Procedure(s) (LRB): ENTEROSCOPY (N/A)  Patient location during evaluation: PACU Anesthesia Type: General Level of consciousness: awake Pain management: pain level controlled Vital Signs Assessment: post-procedure vital signs reviewed and stable Respiratory status: spontaneous breathing Cardiovascular status: stable Anesthetic complications: no       Last Vitals:  Vitals:   08/16/16 0340 08/16/16 1107  BP: (!) 146/60 (!) 161/60  Pulse: (!) 51   Resp: 20 20  Temp: 36.7 C 36.4 C    Last Pain:  Vitals:   08/16/16 1107  TempSrc: Oral                 Laurette Villescas

## 2016-08-17 NOTE — Telephone Encounter (Signed)
Pt was admitted to hosp for Acute on chronic diastolic CHF (congestive heart failure). Pt was D/c 1/21, and was recommended to follow-up with cardiology within the next 7-10 days,and currently off eliquis.Marland KitchenJohny [name]

## 2016-08-18 ENCOUNTER — Encounter: Payer: Self-pay | Admitting: Gastroenterology

## 2016-08-18 NOTE — Progress Notes (Signed)
Letter mailed

## 2016-08-19 ENCOUNTER — Telehealth: Payer: Self-pay | Admitting: *Deleted

## 2016-08-19 DIAGNOSIS — E1151 Type 2 diabetes mellitus with diabetic peripheral angiopathy without gangrene: Secondary | ICD-10-CM | POA: Diagnosis not present

## 2016-08-19 DIAGNOSIS — K5520 Angiodysplasia of colon without hemorrhage: Secondary | ICD-10-CM | POA: Diagnosis not present

## 2016-08-19 DIAGNOSIS — K8020 Calculus of gallbladder without cholecystitis without obstruction: Secondary | ICD-10-CM | POA: Diagnosis not present

## 2016-08-19 DIAGNOSIS — R7881 Bacteremia: Secondary | ICD-10-CM | POA: Diagnosis not present

## 2016-08-19 DIAGNOSIS — L03114 Cellulitis of left upper limb: Secondary | ICD-10-CM | POA: Diagnosis not present

## 2016-08-19 DIAGNOSIS — B9561 Methicillin susceptible Staphylococcus aureus infection as the cause of diseases classified elsewhere: Secondary | ICD-10-CM | POA: Diagnosis not present

## 2016-08-19 NOTE — Telephone Encounter (Signed)
Shana left a vm stating the patient's wife contacted them requesting they remove patient's PICC line. Shana states on the vm they have no HH orders.

## 2016-08-19 NOTE — Telephone Encounter (Signed)
I have talked with [facility] at [facility]----they do have capability to assess and then d/c picc line if needed---patient was discharged from recent hospital stay and was advised in discharge summary to make follow up appt with pcp---so patient went home without any home health orders---I have gotten verbal order to resume previous home health orders from dr plotnikov (which were to manage sepsis/staff infection , chf, and diabetes process)---since patient did not schedule follow up appt with dr plotnikov---I am calling patient back to see if follow up appt can be made---home health orders may also change if patient keeps follow up appt

## 2016-08-21 DIAGNOSIS — K5520 Angiodysplasia of colon without hemorrhage: Secondary | ICD-10-CM | POA: Diagnosis not present

## 2016-08-21 DIAGNOSIS — B9561 Methicillin susceptible Staphylococcus aureus infection as the cause of diseases classified elsewhere: Secondary | ICD-10-CM | POA: Diagnosis not present

## 2016-08-21 DIAGNOSIS — K8020 Calculus of gallbladder without cholecystitis without obstruction: Secondary | ICD-10-CM | POA: Diagnosis not present

## 2016-08-21 DIAGNOSIS — L03114 Cellulitis of left upper limb: Secondary | ICD-10-CM | POA: Diagnosis not present

## 2016-08-21 DIAGNOSIS — E1151 Type 2 diabetes mellitus with diabetic peripheral angiopathy without gangrene: Secondary | ICD-10-CM | POA: Diagnosis not present

## 2016-08-21 DIAGNOSIS — R7881 Bacteremia: Secondary | ICD-10-CM | POA: Diagnosis not present

## 2016-08-26 ENCOUNTER — Ambulatory Visit (INDEPENDENT_AMBULATORY_CARE_PROVIDER_SITE_OTHER): Payer: Medicare Other | Admitting: Physician Assistant

## 2016-08-26 ENCOUNTER — Encounter: Payer: Self-pay | Admitting: Physician Assistant

## 2016-08-26 VITALS — BP 138/58 | HR 53 | Ht 71.0 in | Wt 256.4 lb

## 2016-08-26 DIAGNOSIS — I808 Phlebitis and thrombophlebitis of other sites: Secondary | ICD-10-CM | POA: Insufficient documentation

## 2016-08-26 DIAGNOSIS — I4819 Other persistent atrial fibrillation: Secondary | ICD-10-CM

## 2016-08-26 DIAGNOSIS — E785 Hyperlipidemia, unspecified: Secondary | ICD-10-CM

## 2016-08-26 DIAGNOSIS — I2510 Atherosclerotic heart disease of native coronary artery without angina pectoris: Secondary | ICD-10-CM | POA: Diagnosis not present

## 2016-08-26 DIAGNOSIS — I10 Essential (primary) hypertension: Secondary | ICD-10-CM

## 2016-08-26 DIAGNOSIS — I350 Nonrheumatic aortic (valve) stenosis: Secondary | ICD-10-CM

## 2016-08-26 DIAGNOSIS — N183 Chronic kidney disease, stage 3 unspecified: Secondary | ICD-10-CM

## 2016-08-26 DIAGNOSIS — I771 Stricture of artery: Secondary | ICD-10-CM

## 2016-08-26 DIAGNOSIS — I5032 Chronic diastolic (congestive) heart failure: Secondary | ICD-10-CM

## 2016-08-26 DIAGNOSIS — Z8719 Personal history of other diseases of the digestive system: Secondary | ICD-10-CM | POA: Diagnosis not present

## 2016-08-26 DIAGNOSIS — Z872 Personal history of diseases of the skin and subcutaneous tissue: Secondary | ICD-10-CM

## 2016-08-26 DIAGNOSIS — I481 Persistent atrial fibrillation: Secondary | ICD-10-CM

## 2016-08-26 HISTORY — DX: Phlebitis and thrombophlebitis of other sites: I80.8

## 2016-08-26 NOTE — Progress Notes (Signed)
Cardiology Office Note:    Date:  08/26/2016   ID:  Michael [name], DOB 24-Aug-1940, MRN [id number]  PCP:  Michael Kehr, MD  Cardiologist:  Dr. Jenkins [name]  PV: Dr. Kathlyn [name]   Electrophysiologist:  n/a  Referring MD: Michael Anger, MD   Chief Complaint  Patient presents with  . Hospitalization Follow-up    MI >> s/p PCI; a/c CHF 2/2 GI bleed    History of Present Illness:    Michael [name] is a 76 y.o. male, previously followed by Dr. Darlin [name], with a hx of CAD, carotid artery disease, paroxysmal AF, HTN, HL, DM, prior GI bleed (small bowel AVM).  He was admitted in 3/16 with AF with RVR >> NSR.  He was placed on Eliquis for anticoagulation.  He had elevated Troponin levels felt to be 2/2 demand ischemia.  EF was normal on echocardiogram during that admission and stress nuclear study demonstrated no ischemia.  He was admitted again in 4/16 with AF with RVR c/b congestive heart failure.  He was placed on Amiodarone and converted to NSR.  He had elevated Troponin levels again and a LHC demonstrated mild to mod CAD in the Dx, LCx, OM.  There was also innominate artery stenosis of borderline significance.  A follow up CTA demonstrated 50% R subclavian artery stenosis.  He saw Dr. Kathlyn [name] in 11/16 and his R arm symptoms were felt to mainly related to psoriatic arthritis and no vascular intervention was felt to be necessary.  He established with Dr. Jenkins [name] in 5/17.    Admitted 1/1-1/9 with a non-STEMI. Cardiac catheterization demonstrated 80% hazy stenosis involving the proximal OM1 felt to be the culprit lesion. This was treated with a Synergy DES. There was mild to moderate nonobstructive disease in the diagonal, LAD, AV groove circumflex and RCA. He had mildly elevated left and right heart filling pressures as well as mild pulmonary hypertension. Triple therapy was recommended with aspirin, clopidogrel and apixaban 1 month. After 30 days, aspirin could be  discontinued. His creatinine increased after cardiac catheterization. Echocardiogram demonstrated normal ejection fraction, moderate diastolic dysfunction, moderate aortic stenosis and moderate pulmonary hypertension. His hospital course complicated by left wrist cellulitis with MSSA bacteremia requiring IV antibiotics as well as diarrhea with suspected C. difficile (toxin was negative).  Readmitted 1/15-1/21 with acute on chronic diastolic CHF in the setting of worsening (heme+) anemia.  He required transfusion with PRBCs x 1.  His Eliquis was held. He developed AKI with diuresis. Capsule endoscopy by gastroenterology demonstrated multiple small AVMs in the mid small bowel which was the likely source of his anemia. He was continued on dual antiplatelet therapy given recent PCI with DES the setting of ACS. Eliquis was held at discharge.  Discharge creatinine 1.75. Discharge Hgb 8.9. Discharge weight 249 (-26 pounds).  He returns for follow up. He is here today with his wife. Since DC, his weights have been stable. He denies significant dyspnea. He denies chest discomfort. He denies orthopnea, PND. LE edema remains improved. He denies syncope. He denies any bleeding issues. He continues to have left wrist discomfort since his cellulitis improved. He is no longer on antibiotics. He missed his follow-up with infectious disease due to his repeat admission.  CHADS2-VASc=5 (CHF, vascular dz, HTN, DM, > 12 yo).   Prior CV studies that were reviewed today include:    [facility] 07/29/16 LAD proximal 40, mid 10, D1 60 LCx proximal 30, distal 30, OM1 80 RA mean 9,  RV 43/11, PA 44/12, mean 26; PCWP 18 AV mean gradient 12, calculated AVA 1.78 cm PCI: 2.25 x 38 mm Synergy DES to the OM1  Echo 08/01/16 Severe LVH, EF 60-65, normal wall motion, grade 2 diastolic dysfunction, moderate aortic stenosis (mean 20, peak 37), MAC, mild LAE, PASP 53  Echo 12/17 Moderate concentric LVH, EF 60-65, normal wall motion, grade 1  diastolic dysfunction, moderate aortic stenosis (mean 26), mild LAE, mild TR, PASP 52  Carotid US 5/17 Bilateral ICA 40-59, mild right subclavian steal Follow-up 1 year  LHC 4/16 LM:  widely patent. LAD:  widely patent; D1 50-70%  LCx:  Patent.  OM2 proximal eccentric 50-70%; mid CFX after origin of OM2 50% RI: widely patent. RCA:  widely patent.. INNOMINATE ANGIOGRAPHY: There is heavy calcification with at least 50% obstruction in the innominate artery LV gram EF 65%..  Echo 3/16 Mild LVH, EF 55-60, mild LAE  Myoview 3/16 No ischemia or scar, EF 49.  Intermediate Risk  Past Medical History:  Diagnosis Date  . Acute on chronic heart failure ([place])   . Allergy   . Arthritis   . AVM (arteriovenous malformation) of colon   . C. difficile enteritis   . CAD (coronary artery disease)    a. Cath 10/2014 - Mild to moderate diagonal, circumflex and obtuse marginal disease. Innominate artery sternosis.  . Carotid artery disease ([place])    a. Carotid dopple 03/2014 13-24% RICA &  40-10% LICA stenosis b. 2/72  . CKD (chronic kidney disease), stage III   . Clostridium difficile colitis 05/13/2016  . Dyspnea   . Dysrhythmia    ATRIAL FIBRILATION  . Elevated troponin    a. 09/2014 in setting of AF RVR-->Myoview: EF 49%, no ischemia/infarct->Med Rx.  . GERD (gastroesophageal reflux disease)   . Gout   . H/O transfusion of whole blood   . Heart murmur   . History of PFTs    PFTs 6/16:  FVC 3.73 (87%), FEV1 2.93 (88%), FEV1/FVC 78%, DLCO 69%  . Hx of adenomatous colonic polyps 07/02/2016  . Hyperlipidemia   . Hypertension   . Hypothyroidism   . Iron deficiency anemia   . PAF (paroxysmal atrial fibrillation) ([place])    a. 09/2014: Converted on Dilt;  b. CHA2DS2VASc = 3-->eliquis;  c. 09/2014 Echo: EF 55-60%, mild LVH, mildly dil LA.  [name] Personal history of colonic polyps 2007, 2008   adenoma each time, largest 12 mm in 2007  . Psoriasis   . Staph infection 07/2016   left hand   . Type II  diabetes mellitus ([place])    TYPE 2    Past Surgical History:  Procedure Laterality Date  . APPENDECTOMY  02/09/2014  . CARDIAC CATHETERIZATION N/A 07/29/2016   Procedure: Right/Left Heart Cath and Coronary Angiography;  Surgeon: Nelva Bush, MD;  Location: [place] CV [facility];  Service: Cardiovascular;  Laterality: N/A;  . CARDIAC CATHETERIZATION N/A 07/29/2016   Procedure: Coronary Stent Intervention;  Surgeon: Nelva Bush, MD;  Location: [place] CV [facility];  Service: Cardiovascular;  Laterality: N/A;  . COLONOSCOPY  02/10/2011   internal hemorrhoids  . COLONOSCOPY W/ POLYPECTOMY  04/09/2006   12 mm adenoma  . COLONOSCOPY W/ POLYPECTOMY  06/17/2007   5 mm adenoma  . COLONOSCOPY WITH PROPOFOL N/A 05/13/2016   Procedure: COLONOSCOPY WITH PROPOFOL;  Surgeon: Manus Gunning, MD;  Location: [facility];  Service: Gastroenterology;  Laterality: N/A;  . CORONARY STENT PLACEMENT  07/29/2016   OMI    DES  .  ENTEROSCOPY N/A 08/14/2016   Procedure: ENTEROSCOPY;  Surgeon: Manus Gunning, MD;  Location: [facility];  Service: Gastroenterology;  Laterality: N/A;  . ESOPHAGOGASTRODUODENOSCOPY  12/23/2011   Procedure: ESOPHAGOGASTRODUODENOSCOPY (EGD);  Surgeon: Irene Shipper, MD;  Location: [facility];  Service: Endoscopy;  Laterality: N/A;  with small bowel bx's  . GIVENS CAPSULE STUDY  12/28/2011  . GIVENS CAPSULE STUDY N/A 08/14/2016   Procedure: GIVENS CAPSULE STUDY;  Surgeon: Manus Gunning, MD;  Location: [place];  Service: Gastroenterology;  Laterality: N/A;  . KNEE ARTHROSCOPY Right   . LAPAROSCOPIC APPENDECTOMY N/A 02/09/2014   Procedure: APPENDECTOMY LAPAROSCOPIC;  Surgeon: Leighton Ruff, MD;  Location: [facility];  Service: General;  Laterality: N/A;  . LEFT HEART CATHETERIZATION WITH CORONARY ANGIOGRAM N/A 11/15/2014   Procedure: LEFT HEART CATHETERIZATION WITH CORONARY ANGIOGRAM;  Surgeon: Belva Crome, MD;  Location: [facility] CATH LAB;  Service: Cardiovascular;   Laterality: N/A;  . ROTATOR CUFF REPAIR     left    Current Medications: Current Meds  Medication Sig  . acetaminophen (TYLENOL) 325 MG tablet Take 2 tablets (650 mg total) by mouth every 4 (four) hours as needed for mild pain, fever or headache.  . allopurinol (ZYLOPRIM) 100 MG tablet Take 2 tablets (200 mg total) by mouth daily.  [name] amiodarone (PACERONE) 200 MG tablet TAKE 1 TABLET BY MOUTH DAILY  . aspirin 81 MG chewable tablet Chew 1 tablet (81 mg total) by mouth daily.  [name] atorvastatin (LIPITOR) 40 MG tablet Take 1 tablet (40 mg total) by mouth daily.  . Blood Glucose Monitoring Suppl (ONE TOUCH ULTRA 2) W/DEVICE KIT Use as directed Dx E11.9  . Blood Pressure Monitor KIT Use to check blood pressure daily Dx I10  . Cholecalciferol 1000 UNITS TBDP Take 1,000-5,000 Units by mouth daily. Takes 1000 units everyday except for on Saturday patient takes 5000 units  . clopidogrel (PLAVIX) 75 MG tablet Take 1 tablet (75 mg total) by mouth daily with breakfast.  . diltiazem (CARDIZEM CD) 180 MG 24 hr capsule Take 1 capsule (180 mg total) by mouth daily.  . fenofibrate 160 MG tablet TAKE 1 TABLET(160 MG) BY MOUTH DAILY  . ferrous sulfate 325 (65 FE) MG tablet Take 325 mg by mouth 3 (three) times daily with meals.  . furosemide (LASIX) 20 MG tablet Take 2 tablets (40 mg total) by mouth daily.  [name] glipiZIDE (GLUCOTROL) 10 MG tablet Take 10 mg by mouth daily.  [name] glucose blood (ONE TOUCH ULTRA TEST) test strip 1 each by Other route 4 (four) times daily. Use to check blood sugar four times a day  Dx: E11.9- insulin Dependant  . hydrALAZINE (APRESOLINE) 25 MG tablet Take 1 tablet (25 mg total) by mouth 3 (three) times daily.  [name] HYDROcodone-acetaminophen (NORCO/VICODIN) 5-325 MG tablet Take 1 tablet by mouth 2 (two) times daily as needed for moderate pain or severe pain.  . Insulin Glargine (TOUJEO SOLOSTAR) 300 UNIT/ML SOPN Inject 52 Units into the skin daily.  . Insulin Pen Needle 31G X 5 MM MISC 1  Units by Does not apply route 4 (four) times daily.  . Lancets (ONETOUCH ULTRASOFT) lancets Use to check blood sugars daily  . levothyroxine (SYNTHROID, LEVOTHROID) 150 MCG tablet TAKE 1 TABLET BY MOUTH DAILY BEFORE BREAKFAST  . meclizine (ANTIVERT) 12.5 MG tablet Take 1 tablet (12.5 mg total) by mouth 2 (two) times daily as needed for dizziness (do not use for more than 3days).  . metoprolol 75 MG TABS Take  75 mg by mouth 2 (two) times daily.  [name] omeprazole (PRILOSEC) 40 MG capsule TAKE 1 CAPSULE BY MOUTH EVERY DAY  . potassium chloride SA (K-DUR,KLOR-CON) 20 MEQ tablet Take 1 tablet (20 mEq total) by mouth daily.     Allergies:   Percocet [oxycodone-acetaminophen]; Invokana [canagliflozin]; and Metformin and related   Social History   Social History  . Marital status: Married    Spouse name: N/A  . Number of children: N/A  . Years of education: 16   Occupational History  .  Retired    Social History Main Topics  . Smoking status: Former Smoker    Packs/day: 0.70    Years: 48.00    Types: Cigarettes    Quit date: 03/29/2006  . Smokeless tobacco: Never Used  . Alcohol use 0.6 oz/week    1 Cans of beer per week     Comment: occasional alcohol intake  . Drug use: No  . Sexual activity: Not Currently   Other Topics Concern  . None   Social History Narrative   Regular exercise-no   Caffeine Use-yes     Family History:  The patient's family history includes COPD in his sister; Cancer in his brother; Diabetes in his father; Heart attack in his father; Heart disease in his brother; Hypertension in his father and mother; Lung cancer in his father; Stroke in his father and mother.   ROS:   Please see the history of present illness.    ROS All other systems reviewed and are negative.   EKGs/Labs/Other Test Reviewed:    EKG:  EKG is  ordered today.  The ekg ordered today demonstrates Sinus bradycardia, HR 53, normal axis, Q-wave in lead 3, QTc 439 ms, no significant changes.    Recent Labs: 07/27/2016: TSH 4.452 08/10/2016: ALT 9; B Natriuretic Peptide 468.1 08/15/2016: Hemoglobin 8.9; Platelets 149 08/16/2016: BUN 28; Creatinine, Ser 1.75; Potassium 4.1; Sodium 137   Recent Lipid Panel    Component Value Date/Time   CHOL 101 08/01/2016 0440   TRIG 344 (H) 08/01/2016 0440   HDL 22 (L) 08/01/2016 0440   CHOLHDL 4.6 08/01/2016 0440   VLDL 69 (H) 08/01/2016 0440   LDLCALC 10 08/01/2016 0440   LDLDIRECT 26.0 03/20/2015 1003     Physical Exam:    VS:  BP (!) 138/58   Pulse (!) 53   Ht 5' 11" (1.803 m)   Wt 256 lb 6.4 oz (116.3 kg)   BMI 35.76 kg/m     Wt Readings from Last 3 Encounters:  08/26/16 256 lb 6.4 oz (116.3 kg)  08/16/16 249 lb 11.2 oz (113.3 kg)  08/04/16 272 lb 4.3 oz (123.5 kg)     Physical Exam  Constitutional: He is oriented to person, place, and time. He appears well-developed and well-nourished. No distress.  HENT:  Head: Normocephalic and atraumatic.  Eyes: No scleral icterus.  Neck: No JVD present.  Cardiovascular: Normal rate and regular rhythm.   Murmur heard.  Harsh systolic murmur is present with a grade of 2/6  at the upper right sternal border Pulmonary/Chest: Effort normal. He has no wheezes. He has no rales.  Abdominal: He exhibits no distension. There is no tenderness.  Musculoskeletal: He exhibits edema.  Trace bilateral LE edema  Neurological: He is alert and oriented to person, place, and time.  Skin: Skin is warm and dry.  Psychiatric: He has a normal mood and affect.    ASSESSMENT:    1. Coronary artery disease involving  native coronary artery of native heart without angina pectoris   2. Chronic diastolic CHF (congestive heart failure) (HCC)   3. Persistent atrial fibrillation ([place])   4. History of GI bleed   5. Essential hypertension   6. Hyperlipidemia, unspecified hyperlipidemia type   7. Subclavian artery stenosis, right (HCC)   8. Nonrheumatic aortic valve stenosis   9. History of cellulitis    10. CKD (chronic kidney disease) stage 3, GFR 30-59 ml/min    PLAN:    In order of problems listed above:  1. CAD - status post non-STEMI treated with DES to the OM1. He had mild to moderate residual CAD in the LAD and LCx at catheterization.  He remains on dual antiplatelet therapy with aspirin and Plavix. Continue statin, beta blocker.  2. Chronic diastolic CHF - Volume remains stable after recent admission with acute on chronic diastolic CHF in the setting of anemia. Continue current therapy. Check BMET today.  3. Persistent AF - He maintains normal sinus rhythm on amiodarone. Unfortunately, Eliquis had to be discontinued in the setting of GI bleed. He has significant risk factors for stroke and would benefit from anticoagulation.  But, at this point his bleeding risk appears too great. Follow-up with GI is pending.  Would hold off on resuming Eliquis for now in light of need for antiplatelet therapy for recent PCI with DES in the setting of ACS. If GI clears him for anticoagulation in the future, we would need to discontinue aspirin and continue Plavix in addition to Eliquis.  4. History of GI bleed - This was in the setting of multiple small AVMs in the small bowel. Follow-up with GI as planned. Check CBC today.  5. HTN - Controlled.  6. HL - LDL was 10 in 1/18. Continue statin.  7. R Subclavian stenosis - Follow-up with Dr. Fletcher Anon as planned.    8. Aortic stenosis - Moderate by recent echocardiogram.  9. History of cellulitis - He had left wrist cellulitis during his first admission. He continues to have pain. He missed his follow-up with ID due to recurrent admission. Arrange follow-up with ID.  10. CKD - Obtain follow-up BMET today.   Medication Adjustments/Labs and Tests Ordered: Current medicines are reviewed at length with the patient today.  Concerns regarding medicines are outlined above.  Medication changes, Labs and Tests ordered today are outlined in the Patient  Instructions noted below. Patient Instructions  Medication Instructions:  Your physician recommends that you continue on your current medications as directed. Please refer to the Current Medication list given to you today.  Labwork: TODAY BMET, CBC   Testing/Procedures: NONE  Follow-Up: 1. DR. Johnsie Cancel IN 6 WEEKS 2. YOU ARE BEING REFERRED TO DR. CAMPBELL [facility] FOR LEFT WRIST 3. CARDIAC REHAB REFERRAL PLACED TODAY  Any Other Special Instructions Will Be Listed Below (If Applicable).  If you need a refill on your cardiac medications before your next appointment, please call your pharmacy.  Signed, Richardson Dopp, PA-C  08/26/2016 5:15 PM    [facility] [address], [place], [place]  [address] Phone: [phone]; Fax: [phone]

## 2016-08-26 NOTE — Patient Instructions (Addendum)
Medication Instructions:  Your physician recommends that you continue on your current medications as directed. Please refer to the Current Medication list given to you today.  Labwork: TODAY BMET, CBC   Testing/Procedures: NONE  Follow-Up: 1. DR. Johnsie Cancel IN 6 WEEKS 2. YOU ARE BEING REFERRED TO DR. CAMPBELL [facility] FOR LEFT WRIST 3. CARDIAC REHAB REFERRAL PLACED TODAY  Any Other Special Instructions Will Be Listed Below (If Applicable).  If you need a refill on your cardiac medications before your next appointment, please call your pharmacy.

## 2016-08-27 ENCOUNTER — Other Ambulatory Visit: Payer: Self-pay

## 2016-08-27 DIAGNOSIS — L03114 Cellulitis of left upper limb: Secondary | ICD-10-CM | POA: Diagnosis not present

## 2016-08-27 DIAGNOSIS — R7881 Bacteremia: Secondary | ICD-10-CM | POA: Diagnosis not present

## 2016-08-27 DIAGNOSIS — B9561 Methicillin susceptible Staphylococcus aureus infection as the cause of diseases classified elsewhere: Secondary | ICD-10-CM | POA: Diagnosis not present

## 2016-08-27 DIAGNOSIS — K5520 Angiodysplasia of colon without hemorrhage: Secondary | ICD-10-CM | POA: Diagnosis not present

## 2016-08-27 DIAGNOSIS — E1151 Type 2 diabetes mellitus with diabetic peripheral angiopathy without gangrene: Secondary | ICD-10-CM | POA: Diagnosis not present

## 2016-08-27 DIAGNOSIS — K8020 Calculus of gallbladder without cholecystitis without obstruction: Secondary | ICD-10-CM | POA: Diagnosis not present

## 2016-08-27 LAB — CBC WITH DIFFERENTIAL/PLATELET
BASOS ABS: 0 10*3/uL (ref 0.0–0.2)
Basos: 1 %
EOS (ABSOLUTE): 0.2 10*3/uL (ref 0.0–0.4)
Eos: 4 %
Hematocrit: 31.6 % — ABNORMAL LOW (ref 37.5–51.0)
Hemoglobin: 10.5 g/dL — ABNORMAL LOW (ref 13.0–17.7)
IMMATURE GRANULOCYTES: 1 %
Immature Grans (Abs): 0 10*3/uL (ref 0.0–0.1)
Lymphocytes Absolute: 1.1 10*3/uL (ref 0.7–3.1)
Lymphs: 22 %
MCH: 29 pg (ref 26.6–33.0)
MCHC: 33.2 g/dL (ref 31.5–35.7)
MCV: 87 fL (ref 79–97)
MONOS ABS: 0.5 10*3/uL (ref 0.1–0.9)
Monocytes: 11 %
NEUTROS PCT: 61 %
Neutrophils Absolute: 2.9 10*3/uL (ref 1.4–7.0)
PLATELETS: 199 10*3/uL (ref 150–379)
RBC: 3.62 x10E6/uL — AB (ref 4.14–5.80)
RDW: 18 % — AB (ref 12.3–15.4)
WBC: 4.7 10*3/uL (ref 3.4–10.8)

## 2016-08-27 LAB — BASIC METABOLIC PANEL
BUN/Creatinine Ratio: 19 (ref 10–24)
BUN: 33 mg/dL — ABNORMAL HIGH (ref 8–27)
CALCIUM: 9 mg/dL (ref 8.6–10.2)
CHLORIDE: 101 mmol/L (ref 96–106)
CO2: 20 mmol/L (ref 18–29)
Creatinine, Ser: 1.75 mg/dL — ABNORMAL HIGH (ref 0.76–1.27)
GFR calc Af Amer: 44 mL/min/{1.73_m2} — ABNORMAL LOW (ref >59)
GFR calc non Af Amer: 38 mL/min/{1.73_m2} — ABNORMAL LOW (ref >59)
GLUCOSE: 131 mg/dL — AB (ref 65–99)
POTASSIUM: 4.7 mmol/L (ref 3.5–5.2)
SODIUM: 143 mmol/L (ref 134–144)

## 2016-08-27 NOTE — Patient Outreach (Signed)
[facility]) Care Management  08/27/2016  Michael [name] 08/02/1940 [id number]       EMMI-HF RED ON EMMI ALERT Day # 8 Date: 08/26/16 Red Alert Reason: "Weight? 248 lbs  Went to follow up appt?No"    Outreach attempt #1 to patient. Consent given to speak with spouse. Spouse report that patient is doing well. Reviewed and addressed red alerts. She reports that weight was accurate and that patient normally fluctuates about a one or two pounds per day. She denies patient having any SOB or edema. Patient taking two diuretic tabs daily. Spouse states that patient did sneak last night and had a salty snack. Reviewed with spouse importance of patient adhering to low sodium diet to help with CHF and weight management. She voiced understanding. Reviewed s/s of worsening condition and when to seek medical attention. Advised spouse that if patient has sudden weight gain 3lbs or more to contact MD. She voiced understanding. Patient has PCP f/u appt on tomorrow. He saw cardiologist on yesterday. Spouse denies any issues with meds or transportation. Advised spouse that patient would continue to get automated EMMI HF post discharge calls and if responses are abnormal that it will trigger a call from a nurse to check on patient. She voiced understanding and was appreciative of call.     Plan: RN CM will notify [facility] administrative assistant of case status.    Enzo Montgomery, RN,BSN,CCM [facility] Telephonic Care Management Coordinator Direct Phone: [phone] Toll Free: [phone] Fax: [phone]

## 2016-08-28 ENCOUNTER — Ambulatory Visit (INDEPENDENT_AMBULATORY_CARE_PROVIDER_SITE_OTHER): Payer: Medicare Other | Admitting: Internal Medicine

## 2016-08-28 ENCOUNTER — Encounter: Payer: Self-pay | Admitting: Internal Medicine

## 2016-08-28 VITALS — BP 126/58 | HR 58 | Temp 98.5°F | Resp 20 | Ht 71.0 in | Wt 253.0 lb

## 2016-08-28 DIAGNOSIS — I5032 Chronic diastolic (congestive) heart failure: Secondary | ICD-10-CM | POA: Diagnosis not present

## 2016-08-28 DIAGNOSIS — E559 Vitamin D deficiency, unspecified: Secondary | ICD-10-CM

## 2016-08-28 DIAGNOSIS — I481 Persistent atrial fibrillation: Secondary | ICD-10-CM

## 2016-08-28 DIAGNOSIS — E1151 Type 2 diabetes mellitus with diabetic peripheral angiopathy without gangrene: Secondary | ICD-10-CM | POA: Diagnosis not present

## 2016-08-28 DIAGNOSIS — K558 Other vascular disorders of intestine: Secondary | ICD-10-CM | POA: Diagnosis not present

## 2016-08-28 DIAGNOSIS — K5521 Angiodysplasia of colon with hemorrhage: Secondary | ICD-10-CM

## 2016-08-28 DIAGNOSIS — Z23 Encounter for immunization: Secondary | ICD-10-CM

## 2016-08-28 DIAGNOSIS — D500 Iron deficiency anemia secondary to blood loss (chronic): Secondary | ICD-10-CM

## 2016-08-28 DIAGNOSIS — I808 Phlebitis and thrombophlebitis of other sites: Secondary | ICD-10-CM

## 2016-08-28 DIAGNOSIS — I4819 Other persistent atrial fibrillation: Secondary | ICD-10-CM

## 2016-08-28 MED ORDER — NITROGLYCERIN 0.4 MG SL SUBL: 0.4000 mg | SUBLINGUAL_TABLET | SUBLINGUAL | 3 refills | Status: DC | PRN

## 2016-08-28 MED ORDER — HYDROCODONE-ACETAMINOPHEN 5-325 MG PO TABS: 1.0000 | ORAL_TABLET | Freq: Two times a day (BID) | ORAL | 0 refills | Status: DC | PRN

## 2016-08-28 NOTE — Patient Instructions (Signed)
Arnica ointment

## 2016-08-28 NOTE — Progress Notes (Signed)
Subjective:  Patient ID: Michael [name], male    DOB: [birth year]  Age: 76 y.o. MRN: [id number]  CC: No chief complaint on file.   HPI Michael [name] presents for post-hosp f/u for CHF - d/c'd on 1/21. He had a  GI bleed and an MI earlier F/u CHF, CAD, MI, DM C/o L wrist pain after IVs  "Michael Pacitti Jonesis a 76 y.o.malewith a past medical history significant for HFpEF, psoriasis on Humira, CKD III baseline Cr 1.4, IDDM, iron def anemia, pAF on apixaban, hypothyoridism, HTN, and CAD with recent DES to [facility] presents with 1 week swelling and now dyspnea/orthopnea. Admitted with CHF exacerbation started on IV lasix"  Outpatient Medications Prior to Visit  Medication Sig Dispense Refill  . acetaminophen (TYLENOL) 325 MG tablet Take 2 tablets (650 mg total) by mouth every 4 (four) hours as needed for mild pain, fever or headache.    . allopurinol (ZYLOPRIM) 100 MG tablet Take 2 tablets (200 mg total) by mouth daily. 60 tablet 11  . amiodarone (PACERONE) 200 MG tablet TAKE 1 TABLET BY MOUTH DAILY 30 tablet 6  . aspirin 81 MG chewable tablet Chew 1 tablet (81 mg total) by mouth daily. 30 tablet 0  . atorvastatin (LIPITOR) 40 MG tablet Take 1 tablet (40 mg total) by mouth daily. 90 tablet 2  . Blood Glucose Monitoring Suppl (ONE TOUCH ULTRA 2) W/DEVICE KIT Use as directed Dx E11.9 1 each 0  . Blood Pressure Monitor KIT Use to check blood pressure daily Dx I10 1 each 0  . Cholecalciferol 1000 UNITS TBDP Take 1,000-5,000 Units by mouth daily. Takes 1000 units everyday except for on Saturday patient takes 5000 units    . clopidogrel (PLAVIX) 75 MG tablet Take 1 tablet (75 mg total) by mouth daily with breakfast. 30 tablet 0  . diltiazem (CARDIZEM CD) 180 MG 24 hr capsule Take 1 capsule (180 mg total) by mouth daily. 30 capsule 10  . fenofibrate 160 MG tablet TAKE 1 TABLET(160 MG) BY MOUTH DAILY 90 tablet 3  . ferrous sulfate 325 (65 FE) MG tablet Take 325 mg by mouth 3 (three) times daily with  meals.    . furosemide (LASIX) 20 MG tablet Take 2 tablets (40 mg total) by mouth daily. 60 tablet 3  . glipiZIDE (GLUCOTROL) 10 MG tablet Take 10 mg by mouth daily.    [name] glucose blood (ONE TOUCH ULTRA TEST) test strip 1 each by Other route 4 (four) times daily. Use to check blood sugar four times a day  Dx: E11.9- insulin Dependant 120 each 5  . hydrALAZINE (APRESOLINE) 25 MG tablet Take 1 tablet (25 mg total) by mouth 3 (three) times daily. 90 tablet 3  . HYDROcodone-acetaminophen (NORCO/VICODIN) 5-325 MG tablet Take 1 tablet by mouth 2 (two) times daily as needed for moderate pain or severe pain. 100 tablet 0  . Insulin Glargine (TOUJEO SOLOSTAR) 300 UNIT/ML SOPN Inject 52 Units into the skin daily.    . Insulin Pen Needle 31G X 5 MM MISC 1 Units by Does not apply route 4 (four) times daily. 150 each 11  . Lancets (ONETOUCH ULTRASOFT) lancets Use to check blood sugars daily 30 each 11  . levothyroxine (SYNTHROID, LEVOTHROID) 150 MCG tablet TAKE 1 TABLET BY MOUTH DAILY BEFORE BREAKFAST 90 tablet 3  . meclizine (ANTIVERT) 12.5 MG tablet Take 1 tablet (12.5 mg total) by mouth 2 (two) times daily as needed for dizziness (do not use for more than  3days). 6 tablet 0  . metoprolol 75 MG TABS Take 75 mg by mouth 2 (two) times daily. 60 tablet 0  . omeprazole (PRILOSEC) 40 MG capsule TAKE 1 CAPSULE BY MOUTH EVERY DAY 90 capsule 3  . potassium chloride SA (K-DUR,KLOR-CON) 20 MEQ tablet Take 1 tablet (20 mEq total) by mouth daily. 30 tablet 3   No facility-administered medications prior to visit.     ROS Review of Systems  Constitutional: Positive for fatigue. Negative for appetite change and unexpected weight change.  HENT: Negative for congestion, nosebleeds, sneezing, sore throat and trouble swallowing.   Eyes: Negative for itching and visual disturbance.  Respiratory: Negative for cough.   Cardiovascular: Negative for chest pain, palpitations and leg swelling.  Gastrointestinal: Negative for  abdominal distention, blood in stool, diarrhea and nausea.  Genitourinary: Negative for frequency and hematuria.  Musculoskeletal: Positive for arthralgias. Negative for back pain, gait problem, joint swelling and neck pain.  Skin: Negative for rash.  Neurological: Negative for dizziness, tremors, speech difficulty and weakness.  Psychiatric/Behavioral: Negative for agitation, dysphoric mood and sleep disturbance. The patient is not nervous/anxious.     Objective:  BP (!) 126/58   Pulse (!) 58   Temp 98.5 F (36.9 C) (Oral)   Resp 20   Ht 5' 11"  (1.803 m)   Wt 253 lb (114.8 kg)   SpO2 98%   BMI 35.29 kg/m   BP Readings from Last 3 Encounters:  08/28/16 (!) 126/58  08/26/16 (!) 138/58  08/16/16 (!) 161/60    Wt Readings from Last 3 Encounters:  08/28/16 253 lb (114.8 kg)  08/26/16 256 lb 6.4 oz (116.3 kg)  08/16/16 249 lb 11.2 oz (113.3 kg)    Physical Exam  Constitutional: He is oriented to person, place, and time. He appears well-developed. No distress.  NAD  HENT:  Mouth/Throat: Oropharynx is clear and moist.  Eyes: Conjunctivae are normal. Pupils are equal, round, and reactive to light.  Neck: Normal range of motion. No JVD present. No thyromegaly present.  Cardiovascular: Normal rate, regular rhythm, normal heart sounds and intact distal pulses.  Exam reveals no gallop and no friction rub.   No murmur heard. Pulmonary/Chest: Effort normal and breath sounds normal. No respiratory distress. He has no wheezes. He has no rales. He exhibits no tenderness.  Abdominal: Soft. Bowel sounds are normal. He exhibits no distension and no mass. There is no tenderness. There is no rebound and no guarding.  Musculoskeletal: Normal range of motion. He exhibits tenderness. He exhibits no edema.  Lymphadenopathy:    He has no cervical adenopathy.  Neurological: He is alert and oriented to person, place, and time. He has normal reflexes. No cranial nerve deficit. He exhibits normal  muscle tone. He displays a negative Romberg sign. Coordination and gait normal.  Skin: Skin is warm and dry. No rash noted.  Psychiatric: He has a normal mood and affect. His behavior is normal. Judgment and thought content normal.  L dorsal wrist and dist forearm is tender. No phlebitis  Lab Results  Component Value Date   WBC 4.7 08/26/2016   HGB 8.9 (L) 08/15/2016   HCT 31.6 (L) 08/26/2016   PLT 199 08/26/2016   GLUCOSE 131 (H) 08/26/2016   CHOL 101 08/01/2016   TRIG 344 (H) 08/01/2016   HDL 22 (L) 08/01/2016   LDLDIRECT 26.0 03/20/2015   LDLCALC 10 08/01/2016   ALT 9 (L) 08/10/2016   AST 23 08/10/2016   NA 143 08/26/2016  K 4.7 08/26/2016   CL 101 08/26/2016   CREATININE 1.75 (H) 08/26/2016   BUN 33 (H) 08/26/2016   CO2 20 08/26/2016   TSH 4.452 07/27/2016   PSA 0.50 11/07/2013   INR 1.01 07/29/2016   HGBA1C 7.1 (H) 07/27/2016   MICROALBUR 1.2 05/16/2013    Dg Chest 2 View  Result Date: 08/10/2016 CLINICAL DATA:  Shortness breath.  17 pound weight gain in 2 weeks. EXAM: CHEST  2 VIEW COMPARISON:  One-view chest x-ray 08/03/2016. FINDINGS: The heart is enlarged. Atherosclerotic calcifications are present in the aorta. A right-sided PICC line is stable. New bilateral pleural effusions and basilar airspace disease are present. Interstitial edema has increased. No other significant airspace consolidation is present. Vertebral body heights and alignment are maintained. IMPRESSION: 1. Cardiomegaly with increasing interstitial edema and bilateral pleural effusions compatible with congestive heart failure. 2. Aortic atherosclerosis. Electronically Signed   By: San Morelle M.D.   On: 08/10/2016 17:46    Assessment & Plan:   There are no diagnoses linked to this encounter. I am having Mr. Camargo maintain his ferrous sulfate, ONE TOUCH ULTRA 2, onetouch ultrasoft, Cholecalciferol, Blood Pressure Monitor, HYDROcodone-acetaminophen, diltiazem, allopurinol, atorvastatin,  levothyroxine, Insulin Pen Needle, glucose blood, omeprazole, glipiZIDE, amiodarone, meclizine, acetaminophen, aspirin, clopidogrel, Metoprolol Tartrate, fenofibrate, hydrALAZINE, potassium chloride SA, furosemide, and Insulin Glargine.  No orders of the defined types were placed in this encounter.    Follow-up: No Follow-up on file.  [name], MD

## 2016-08-28 NOTE — Progress Notes (Signed)
Pre visit review using our clinic review tool, if applicable. No additional management support is needed unless otherwise documented below in the visit note. 

## 2016-08-31 ENCOUNTER — Other Ambulatory Visit: Payer: Self-pay

## 2016-08-31 NOTE — Patient Outreach (Signed)
[place] [facility]) Care Management  08/31/2016  ZUHAYR DEENEY 26-Aug-1940 [id number]       EMMI- HF RED ON EMMI ALERT Day # 12 Date: 08/30/16 Red Alert Reason: "Weight: 248lbs"    Outreach attempt #1 to patient. No answer at present.     Plan: RN CM will make outreach attempt to patient within one business day.    Enzo Montgomery, RN,BSN,CCM [place] Management Telephonic Care Management Coordinator Direct Phone: [phone] Toll Free: [phone] Fax: [phone]

## 2016-09-01 ENCOUNTER — Other Ambulatory Visit: Payer: Self-pay

## 2016-09-01 NOTE — Patient Outreach (Signed)
[facility]) Care Management  09/01/2016  Michael [name] 1940-12-21 [id number]    EMMI- HF RED ON EMMI ALERT Day # 12 Date: 08/30/16 Red Alert Reason: "Weight: 248 lbs"   Outreach attempt #2 to patient. Spoke with spouse. Reviewed and addressed red alert. Spouse states that weight was up a little but patient was asymptomatic-no swelling, no SOB. She reports weight is back down to 247 lbs this morning. She voices that patient is doing well and has no needs or concerns at this time. Spouse aware of when to seek medical care for changes in condition.      Plan: RN CM will notify [facility] administrative assistant of case status.   Enzo Montgomery, RN,BSN,CCM [place] Management Telephonic Care Management Coordinator Direct Phone: [phone] Toll Free: [phone] Fax: [phone]

## 2016-09-03 DIAGNOSIS — E1151 Type 2 diabetes mellitus with diabetic peripheral angiopathy without gangrene: Secondary | ICD-10-CM | POA: Diagnosis not present

## 2016-09-03 DIAGNOSIS — K8020 Calculus of gallbladder without cholecystitis without obstruction: Secondary | ICD-10-CM | POA: Diagnosis not present

## 2016-09-03 DIAGNOSIS — R7881 Bacteremia: Secondary | ICD-10-CM | POA: Diagnosis not present

## 2016-09-03 DIAGNOSIS — B9561 Methicillin susceptible Staphylococcus aureus infection as the cause of diseases classified elsewhere: Secondary | ICD-10-CM | POA: Diagnosis not present

## 2016-09-03 DIAGNOSIS — K5520 Angiodysplasia of colon without hemorrhage: Secondary | ICD-10-CM | POA: Diagnosis not present

## 2016-09-03 DIAGNOSIS — L03114 Cellulitis of left upper limb: Secondary | ICD-10-CM | POA: Diagnosis not present

## 2016-09-13 ENCOUNTER — Encounter (HOSPITAL_BASED_OUTPATIENT_CLINIC_OR_DEPARTMENT_OTHER): Payer: Medicare Other

## 2016-09-15 ENCOUNTER — Other Ambulatory Visit: Payer: Self-pay

## 2016-09-15 NOTE — Patient Outreach (Signed)
[facility] [facility]) Care Management  09/15/2016  TALLEY KREISER 05/30/1941 [id number]       EMMI-HF RED ON EMMI ALERT Day # 27 Date: 09/14/16 Red Alert Reason: "Lost interest in things they used to enjoy? Yes"   Outreach attempt #1 to patient. Spoke with spouse as patient prefers spouse to handle calls. Spouse reports that machine must have recorded wrong response. She denies that patient has been experiencing any symptoms of feeling down.sad or lost of interest. She states she is pleased with the progress he is making. Spouse reports patient doing better with monitoring and adhering to low sodium diet which has allowed his weight to remain stable. No further RN CM needs or concerns at this time. Patient will continue to get automated EMMI HF calls post discharge for duration of program. Spouse appreciative of follow up calls to check on patient.      Plan: RN CM will notify [facility] administrative assistant of case status.   Enzo Montgomery, RN,BSN,CCM [place] Management Telephonic Care Management Coordinator Direct Phone: [phone] Toll Free: [phone] Fax: [phone]

## 2016-09-21 ENCOUNTER — Encounter: Payer: Self-pay | Admitting: Internal Medicine

## 2016-09-21 ENCOUNTER — Other Ambulatory Visit (INDEPENDENT_AMBULATORY_CARE_PROVIDER_SITE_OTHER): Payer: Medicare Other

## 2016-09-21 ENCOUNTER — Ambulatory Visit (INDEPENDENT_AMBULATORY_CARE_PROVIDER_SITE_OTHER): Payer: Medicare Other | Admitting: Internal Medicine

## 2016-09-21 VITALS — BP 130/50 | HR 64 | Ht 71.0 in | Wt 254.0 lb

## 2016-09-21 DIAGNOSIS — Z7902 Long term (current) use of antithrombotics/antiplatelets: Secondary | ICD-10-CM

## 2016-09-21 DIAGNOSIS — Z9861 Coronary angioplasty status: Secondary | ICD-10-CM | POA: Diagnosis not present

## 2016-09-21 DIAGNOSIS — I2510 Atherosclerotic heart disease of native coronary artery without angina pectoris: Secondary | ICD-10-CM | POA: Diagnosis not present

## 2016-09-21 DIAGNOSIS — D500 Iron deficiency anemia secondary to blood loss (chronic): Secondary | ICD-10-CM | POA: Diagnosis not present

## 2016-09-21 DIAGNOSIS — K5520 Angiodysplasia of colon without hemorrhage: Secondary | ICD-10-CM | POA: Diagnosis not present

## 2016-09-21 LAB — CBC WITH DIFFERENTIAL/PLATELET
Basophils Absolute: 0 10*3/uL (ref 0.0–0.1)
Basophils Relative: 0.7 % (ref 0.0–3.0)
EOS PCT: 3.7 % (ref 0.0–5.0)
Eosinophils Absolute: 0.2 10*3/uL (ref 0.0–0.7)
HCT: 29.6 % — ABNORMAL LOW (ref 39.0–52.0)
HEMOGLOBIN: 10.4 g/dL — AB (ref 13.0–17.0)
LYMPHS ABS: 1 10*3/uL (ref 0.7–4.0)
Lymphocytes Relative: 23.6 % (ref 12.0–46.0)
MCHC: 35.2 g/dL (ref 30.0–36.0)
MCV: 87.9 fl (ref 78.0–100.0)
MONO ABS: 0.3 10*3/uL (ref 0.1–1.0)
Monocytes Relative: 7.5 % (ref 3.0–12.0)
NEUTROS ABS: 2.6 10*3/uL (ref 1.4–7.7)
Neutrophils Relative %: 64.5 % (ref 43.0–77.0)
PLATELETS: 201 10*3/uL (ref 150.0–400.0)
RBC: 3.37 Mil/uL — ABNORMAL LOW (ref 4.22–5.81)
RDW: 18.3 % — AB (ref 11.5–15.5)
WBC: 4 10*3/uL (ref 4.0–10.5)

## 2016-09-21 LAB — FERRITIN: FERRITIN: 562.1 ng/mL — AB (ref 22.0–322.0)

## 2016-09-21 LAB — VITAMIN B12: Vitamin B-12: 1086 pg/mL — ABNORMAL HIGH (ref 211–911)

## 2016-09-21 NOTE — Progress Notes (Addendum)
Michael [name] 76 y.o. 02/11/1941 [id number]  Assessment & Plan:   Encounter Diagnoses  Name Primary?  [name] Anemia due to chronic blood loss Yes  . Angiodysplasia of small intestine   . Long term current use of antithrombotics/antiplatelets   . CAD S/P percutaneous coronary angioplasty    Patient appears to have bleeding from small bowel angiodysplasia though I'm not convinced he had a hemorrhage. He did seem to drop his hemoglobin and a relatively rapid fashion when he was on apixaban and aspirin and clopidogrel.  His hemoglobin is better now on clopidogrel and aspirin. He really has indications for all 3 medications but has not tolerated them due to decline in hemoglobin. He has known small bowel AVMs going back to 2013, and he had done fairly well with maintenance of his hemoglobin a total of late. The triple anticoagulation likely had something to do with it.  I have repeated his CBC today  CBC Latest Ref Rng & Units 09/21/2016 08/26/2016 08/15/2016  WBC 4.0 - 10.5 K/uL 4.0 4.7 2.8(Michael)  Hemoglobin 13.0 - 17.0 g/dL 10.4(Michael) - 8.9(Michael)  Hematocrit 39.0 - 52.0 % 29.6(Michael) 31.6(Michael) 28.4(Michael)  Platelets 150.0 - 400.0 K/uL 201.0 199 149(Michael)   Lab Results  Component Value Date   FERRITIN 562.1 (H) 09/21/2016   Lab Results  Component Value Date   VITAMINB12 1,086 (H) 09/21/2016   These labs are a bit confusing his ferritin is very high so it's possible that bleeding was not the cause of his decline in hemoglobin, he was heme positive but he had no overt signs of a hemorrhage. I did think and explained to him and his wife today that perhaps he did lose blood and that tipped him over to heart failure.  Concerned about the potential for stroke he is at higher risk with his A. fib and his other comorbidities and CHADS2 score of 5  Think it's reasonable to retry him on liquids and Plavix, would avoid triple therapy against the other option is he does get some protection from embolic stroke on aspirin  though not much on 81 mg. Matter what we do he will need close follow-up of his hemoglobin and monitoring for bleeding  Think it's possible he could have some  chronic disease anemia. Also think, reviewing all the records that it's possible that his bacteremia, his acute coronary syndrome, his worsening renal function that occurred in January could've contributed to the decline in hemoglobin without overt bleeding.  I will discuss with Michael [name]  CC: Michael [name] Subjective:   Chief Complaint: Anemia, small bowel AVMs  HPI The patient is here, I know him from previous small bowel AVMs found in 2013, iron deficiency anemia, and  history of colon polyps. He had an MI at the beginning of January 2018, he had Plavix and aspirin added to his regimen of Michael [name], because of drug-eluting stenting and acute coronary syndrome. He returned long after that with congestive heart failure, his hemoglobin was down about a gram, he was heme positive. Push enteroscopy was unrevealing. He had a small bowel capsule endoscopy which once again showed AVMs in the small bowel. None were bleeding. He feels better at this time. He is on Plavix and baby aspirin but no eliquis. Wife is with him and helps w/ history. Medications, allergies, past medical history, past surgical history, family history and social history are reviewed and updated in the EMR.   Review of Systems Still has  some pain where he had an infection in the left hand IV  Objective:   Physical Exam BP (!) 130/50   Pulse 64   Ht 5\' 11"  (1.803 m)   Wt 254 lb (115.2 kg)   BMI 35.43 kg/m  NAD Chronically ill 15 minutes time spent with patient > half in counseling coordination of care

## 2016-09-21 NOTE — Patient Instructions (Signed)
   Your physician has requested that you go to the basement for the following lab work before leaving today: CBC/diff, Ferritin, Vitamin B12    I appreciate the opportunity to care for you. Silvano Rusk, MD, [facility]

## 2016-09-22 ENCOUNTER — Ambulatory Visit (INDEPENDENT_AMBULATORY_CARE_PROVIDER_SITE_OTHER): Payer: Medicare Other | Admitting: Internal Medicine

## 2016-09-22 ENCOUNTER — Ambulatory Visit
Admission: RE | Admit: 2016-09-22 | Discharge: 2016-09-22 | Disposition: A | Discharge status: Home / Self Care | Payer: Medicare Other | Source: Ambulatory Visit | Attending: Internal Medicine | Admitting: Internal Medicine

## 2016-09-22 ENCOUNTER — Encounter: Payer: Self-pay | Admitting: Internal Medicine

## 2016-09-22 DIAGNOSIS — I808 Phlebitis and thrombophlebitis of other sites: Secondary | ICD-10-CM

## 2016-09-22 DIAGNOSIS — I2510 Atherosclerotic heart disease of native coronary artery without angina pectoris: Secondary | ICD-10-CM | POA: Diagnosis not present

## 2016-09-22 DIAGNOSIS — Z9861 Coronary angioplasty status: Secondary | ICD-10-CM | POA: Diagnosis not present

## 2016-09-22 DIAGNOSIS — M7989 Other specified soft tissue disorders: Secondary | ICD-10-CM | POA: Diagnosis not present

## 2016-09-22 DIAGNOSIS — Z8619 Personal history of other infectious and parasitic diseases: Secondary | ICD-10-CM

## 2016-09-22 MED ORDER — CEPHALEXIN 500 MG PO CAPS: 500.0000 mg | ORAL_CAPSULE | Freq: Three times a day (TID) | ORAL | 1 refills | Status: DC

## 2016-09-22 MED ORDER — VANCOMYCIN HCL 125 MG PO CAPS: 125.0000 mg | ORAL_CAPSULE | Freq: Four times a day (QID) | ORAL | 0 refills | Status: DC

## 2016-09-22 NOTE — Assessment & Plan Note (Signed)
He has some persistent soft tissue infection of his left hand. Will start cephalexin and obtain an x-ray.

## 2016-09-22 NOTE — Assessment & Plan Note (Signed)
I will place him back on oral vancomycin.

## 2016-09-22 NOTE — Progress Notes (Signed)
[facility]  Patient Active Problem List   Diagnosis Date Noted  . Septic thrombophlebitis of upper extremity 07/31/2016    Priority: High  . History of Clostridium difficile colitis 07/31/2016    Priority: Medium  . History of GI bleed 08/26/2016  . History of cellulitis 08/26/2016  . Coronary artery disease involving native coronary artery of native heart without angina pectoris 07/31/2016  . PAD (peripheral artery disease) ([place]) 07/31/2016  . Nonrheumatic aortic valve stenosis   . Chronic diastolic CHF (congestive heart failure) ([place]) 07/27/2016  . Hx of adenomatous colonic polyps 07/02/2016  . Shortness of breath 06/05/2016  . Hypothyroidism 03/17/2016  . Noncompliance with diet and medication regimen 12/16/2015  . Frequency-urgency syndrome 03/08/2015  . Subclavian artery stenosis, right ([place]) 01/16/2015  . Cholelithiasis 01/16/2015  . Restless leg syndrome 01/10/2015  . Postherpetic neuralgia 12/18/2014  . Persistent atrial fibrillation ([place])   . Hyperlipidemia   . Essential hypertension   . Psoriasis 03/19/2014  . Unspecified vitamin D deficiency 11/13/2013  . Carotid stenosis 11/06/2013  . Psoriatic arthritis ([place]) 11/06/2013  . Hypertriglyceridemia 05/16/2013  . Morbid obesity due to excess calories ([place]) 09/10/2012  . Gout 09/10/2012  . AVM (arteriovenous malformation) of small bowel, acquired with hemorrhage ([place]) 01/20/2012  . Iron deficiency anemia due to chronic blood loss 01/20/2012  . DM (diabetes mellitus), type 2 with peripheral vascular complications ([place]) 96/41/8937    Patient's Medications  New Prescriptions   CEPHALEXIN (KEFLEX) 500 MG CAPSULE    Take 1 capsule (500 mg total) by mouth 3 (three) times daily.   VANCOMYCIN (VANCOCIN) 125 MG CAPSULE    Take 1 capsule (125 mg total) by mouth 4 (four) times daily.  Previous Medications   ACETAMINOPHEN (TYLENOL) 325 MG TABLET    Take 2 tablets (650 mg total) by mouth  every 4 (four) hours as needed for mild pain, fever or headache.   ALLOPURINOL (ZYLOPRIM) 100 MG TABLET    Take 2 tablets (200 mg total) by mouth daily.   AMIODARONE (PACERONE) 200 MG TABLET    TAKE 1 TABLET BY MOUTH DAILY   ASPIRIN 81 MG CHEWABLE TABLET    Chew 1 tablet (81 mg total) by mouth daily.   ATORVASTATIN (LIPITOR) 40 MG TABLET    Take 1 tablet (40 mg total) by mouth daily.   BLOOD GLUCOSE MONITORING SUPPL (ONE TOUCH ULTRA 2) W/DEVICE KIT    Use as directed Dx E11.9   BLOOD PRESSURE MONITOR KIT    Use to check blood pressure daily Dx I10   CHOLECALCIFEROL 1000 UNITS TBDP    Take 1,000-5,000 Units by mouth daily. Takes 1000 units everyday except for on Saturday patient takes 5000 units   CLOPIDOGREL (PLAVIX) 75 MG TABLET    Take 1 tablet (75 mg total) by mouth daily with breakfast.   DILTIAZEM (CARDIZEM CD) 180 MG 24 HR CAPSULE    Take 1 capsule (180 mg total) by mouth daily.   FENOFIBRATE 160 MG TABLET    TAKE 1 TABLET(160 MG) BY MOUTH DAILY   FERROUS SULFATE 325 (65 FE) MG TABLET    Take 325 mg by mouth 3 (three) times daily with meals.   FUROSEMIDE (LASIX) 20 MG TABLET    Take 2 tablets (40 mg total) by mouth daily.   GLIPIZIDE (GLUCOTROL) 10 MG TABLET    Take 10 mg by mouth daily.   GLUCOSE BLOOD (ONE TOUCH ULTRA TEST) TEST STRIP  1 each by Other route 4 (four) times daily. Use to check blood sugar four times a day  Dx: E11.9- insulin Dependant   HYDRALAZINE (APRESOLINE) 25 MG TABLET    Take 1 tablet (25 mg total) by mouth 3 (three) times daily.   HYDROCODONE-ACETAMINOPHEN (NORCO/VICODIN) 5-325 MG TABLET    Take 1 tablet by mouth 2 (two) times daily as needed for moderate pain.   INSULIN GLARGINE (TOUJEO SOLOSTAR) 300 UNIT/ML SOPN    Inject 52 Units into the skin daily.   INSULIN PEN NEEDLE 31G X 5 MM MISC    1 Units by Does not apply route 4 (four) times daily.   LANCETS (ONETOUCH ULTRASOFT) LANCETS    Use to check blood sugars daily   LEVOTHYROXINE (SYNTHROID, LEVOTHROID) 150  MCG TABLET    TAKE 1 TABLET BY MOUTH DAILY BEFORE BREAKFAST   MECLIZINE (ANTIVERT) 12.5 MG TABLET    Take 1 tablet (12.5 mg total) by mouth 2 (two) times daily as needed for dizziness (do not use for more than 3days).   METOPROLOL 75 MG TABS    Take 75 mg by mouth 2 (two) times daily.   NITROGLYCERIN (NITROSTAT) 0.4 MG SL TABLET    Place 1 tablet (0.4 mg total) under the tongue every 5 (five) minutes as needed for chest pain.   OMEPRAZOLE (PRILOSEC) 40 MG CAPSULE    TAKE 1 CAPSULE BY MOUTH EVERY DAY   POTASSIUM CHLORIDE SA (K-DUR,KLOR-CON) 20 MEQ TABLET    Take 1 tablet (20 mEq total) by mouth daily.  Modified Medications   No medications on file  Discontinued Medications   No medications on file    Subjective: Michael [name] is in for his hospital follow-up visit. He was hospitalized recently and developed left wrist septic thrombophlebitis and MSSA bacteremia after an IV site became infected. He also had a mild relapse of his C. difficile colitis. Repeat blood cultures were negative and there was no evidence of endocarditis by clinical exam or transthoracic echocardiogram. He was discharged on IV cefazolin and completed 2 and half weeks of therapy on 08/18/2016. He missed his hospital follow-up visit because he was rehospitalized with a GI bleed. He is also completed his oral vancomycin. He is still having some swelling and discomfort on the dorsum of his left hand. He has not had any fever, chills or sweats. He is not having any diarrhea.  Review of Systems: Review of Systems  Constitutional: Negative for chills, diaphoresis, fever, malaise/fatigue and weight loss.  HENT: Negative for sore throat.   Respiratory: Negative for cough, sputum production and shortness of breath.   Cardiovascular: Negative for chest pain.  Gastrointestinal: Negative for abdominal pain, diarrhea, heartburn, nausea and vomiting.  Genitourinary: Negative for dysuria and frequency.  Musculoskeletal: Positive for joint  pain. Negative for myalgias.  Skin: Negative for rash.  Neurological: Negative for dizziness and headaches.  Psychiatric/Behavioral: Negative for depression and substance abuse. The patient is not nervous/anxious.     Past Medical History:  Diagnosis Date  . Acute on chronic heart failure ([place])   . Allergy   . Arthritis   . AVM (arteriovenous malformation) of colon   . C. difficile enteritis   . CAD (coronary artery disease)    a. Cath 10/2014 - Mild to moderate diagonal, circumflex and obtuse marginal disease. Innominate artery sternosis.  . Carotid artery disease ([place])    a. Carotid dopple 03/2014 93-71% RICA &  69-67% LICA stenosis b. 8/93  . CKD (chronic kidney  disease), stage III   . Clostridium difficile colitis 05/13/2016  . Dyspnea   . Dysrhythmia    ATRIAL FIBRILATION  . Elevated troponin    a. 09/2014 in setting of AF RVR-->Myoview: EF 49%, no ischemia/infarct->Med Rx.  . GERD (gastroesophageal reflux disease)   . Gout   . H/O transfusion of whole blood   . Heart murmur   . History of PFTs    PFTs 6/16:  FVC 3.73 (87%), FEV1 2.93 (88%), FEV1/FVC 78%, DLCO 69%  . Hx of adenomatous colonic polyps 07/02/2016  . Hyperlipidemia   . Hypertension   . Hypothyroidism   . Iron deficiency anemia   . PAF (paroxysmal atrial fibrillation) ([place])    a. 09/2014: Converted on Dilt;  b. CHA2DS2VASc = 3-->eliquis;  c. 09/2014 Echo: EF 55-60%, mild LVH, mildly dil LA.  [name] Personal history of colonic polyps 2007, 2008   adenoma each time, largest 12 mm in 2007  . Psoriasis   . Staph infection 07/2016   left hand   . Staphylococcus aureus bacteremia 07/31/2016  . Type II diabetes mellitus ([place])    TYPE 2    Social History  Substance Use Topics  . Smoking status: Former Smoker    Packs/day: 0.70    Years: 48.00    Types: Cigarettes    Quit date: 03/29/2006  . Smokeless tobacco: Never Used  . Alcohol use 0.6 oz/week    1 Cans of beer per week     Comment: occasional alcohol intake     Family History  Problem Relation Age of Onset  . Heart attack Father   . Lung cancer Father   . Diabetes Father   . Stroke Father   . Hypertension Father   . Stroke Mother   . Hypertension Mother   . Cancer Brother     liver  . Heart disease Brother     chf  . COPD Sister   . Malignant hyperthermia Neg Hx     Allergies  Allergen Reactions  . Percocet [Oxycodone-Acetaminophen] Nausea And Vomiting  . Invokana [Canagliflozin]     Side effects  . Metformin And Related     Upset stomach    Objective: Vitals:   09/22/16 0908  BP: (!) 188/67  Pulse: 60  Temp: 97.8 F (36.6 C)  TempSrc: Oral  Weight: 252 lb 8 oz (114.5 kg)  Height: '5\' 11"'$  (1.803 m)   Body mass index is 35.22 kg/m.  Physical Exam  Constitutional: He is oriented to person, place, and time.  HENT:  Mouth/Throat: No oropharyngeal exudate.  Eyes: Conjunctivae are normal.  Cardiovascular: Normal rate and regular rhythm.   No murmur heard. Pulmonary/Chest: Effort normal and breath sounds normal.  Abdominal: Soft. There is no tenderness.  Musculoskeletal: He exhibits edema and tenderness.  He has chronic deforming arthritis of his hands and fingers. He has some erythema and swelling over the dorsum of his hand and knuckles. It is slightly tender to touch he has good range of motion.  Neurological: He is alert and oriented to person, place, and time.  Skin: No rash noted.  Psychiatric: Mood and affect normal.    Lab Results    Problem List Items Addressed This Visit      High   Septic thrombophlebitis of upper extremity    He has some persistent soft tissue infection of his left hand. Will start cephalexin and obtain an x-ray.      Relevant Medications   cephALEXin (KEFLEX) 500 MG  capsule   vancomycin (VANCOCIN) 125 MG capsule   Other Relevant Orders   DG Wrist 2 Views Left     Medium   History of Clostridium difficile colitis    I will place him back on oral vancomycin.      Relevant  Medications   vancomycin (VANCOCIN) 125 MG capsule       Michel Bickers, MD [facility] [place] [facility] [phone] pager   [phone] cell 09/22/2016, 9:37 AM

## 2016-09-22 NOTE — Progress Notes (Signed)
Labs show that his iron levels and B12 levels are ok  Hemoglobin stable  I now think his anemia is a result of multiple factors and am not convinced he had major bleeding in January but think the kidney injury he had may have had more to do with the CHF and anemia  Dr. Johnsie Cancel plans to change from Plavix and Asa to Plavix and Eliquis 6 mos after his stent placement  I think that is reasonable but we need to carefully monitor his Hgb.  Ask him to have a CBC again in 1 month dx anemia of chronic disease

## 2016-09-23 ENCOUNTER — Other Ambulatory Visit: Payer: Self-pay

## 2016-09-23 DIAGNOSIS — D638 Anemia in other chronic diseases classified elsewhere: Secondary | ICD-10-CM

## 2016-09-25 ENCOUNTER — Telehealth (HOSPITAL_COMMUNITY): Payer: Self-pay | Admitting: Internal Medicine

## 2016-09-25 NOTE — Telephone Encounter (Signed)
Verified Medicare A/B primary and [employer] Supplemental through Passport Deductible $1340.00 pt has not met.  Reference # [id number], [id number].... Cherylann Banas

## 2016-09-30 ENCOUNTER — Telehealth: Payer: Self-pay | Admitting: *Deleted

## 2016-09-30 MED ORDER — GLIPIZIDE 10 MG PO TABS: 10.0000 mg | ORAL_TABLET | Freq: Two times a day (BID) | ORAL | 5 refills | Status: DC

## 2016-09-30 NOTE — Telephone Encounter (Signed)
rec'd call pt wife states MD increase his Glipizide to 1 pill twice a day. Needing updated script sent top [facility] w/correct instructions. . Pls advise Last glipizide was ordered back in 04/2016 not by Dr. Alain Marion...Michael [name]

## 2016-09-30 NOTE — Telephone Encounter (Signed)
OK Glipizide 10 mg bid - Rx emailed Thx

## 2016-10-01 MED ORDER — GLIPIZIDE 10 MG PO TABS: 10.0000 mg | ORAL_TABLET | Freq: Two times a day (BID) | ORAL | 5 refills | Status: DC

## 2016-10-01 NOTE — Telephone Encounter (Signed)
Resent to [facility]...Michael [name]

## 2016-10-01 NOTE — Telephone Encounter (Signed)
Notified pt wife MD sent new rx to [facility]...Michael [name]

## 2016-10-01 NOTE — Addendum Note (Signed)
Addended by: Earnstine Regal on: 10/01/2016 02:14 PM   Modules accepted: Orders

## 2016-10-01 NOTE — Telephone Encounter (Signed)
Patient's spouse called back stating script needs to be sent to [facility] on [address].

## 2016-10-06 NOTE — Progress Notes (Signed)
Cardiology Office Note:    Date:  10/14/2016   ID:  Michael [name], DOB 03-Aug-1940, MRN [id number]  PCP:  Michael Kehr, MD  Cardiologist:  Michael [name]  PV: Michael [name]   Electrophysiologist:  n/a  Referring MD: Michael Anger, MD   No chief complaint on file.   History of Present Illness:    Michael [name] is a 76 y.o. male, previously followed by Dr. Darlin [name], with a hx of CAD, carotid artery disease, paroxysmal AF, HTN, HL, DM, prior GI bleed (small bowel AVM).  He was admitted in 3/16 with AF with RVR >> NSR.  He was placed on Eliquis for anticoagulation.  He had elevated Troponin levels felt to be 2/2 demand ischemia.  EF was normal on echocardiogram during that admission and stress nuclear study demonstrated no ischemia.  He was admitted again in 4/16 with AF with RVR c/b congestive heart failure.  He was placed on Amiodarone and converted to NSR.  He had elevated Troponin levels again and a LHC demonstrated mild to mod CAD in the Dx, LCx, OM.  There was also innominate artery stenosis of borderline significance.  A follow up CTA demonstrated 50% R subclavian artery stenosis.  He saw Michael [name] in 11/16 and his R arm symptoms were felt to mainly related to psoriatic arthritis and no vascular intervention was felt to be necessary.  He established with Michael [name] in 5/17.    Admitted 1/1-1/9 with a non-STEMI. Cardiac catheterization demonstrated 80% hazy stenosis involving the proximal OM1 felt to be the culprit lesion. This was treated with a Synergy DES. There was mild to moderate nonobstructive disease in the diagonal, LAD, AV groove circumflex and RCA. He had mildly elevated left and right heart filling pressures as well as mild pulmonary hypertension. Triple therapy was recommended with aspirin, clopidogrel and apixaban 1 month. After 30 days, aspirin could be discontinued. His creatinine increased after cardiac catheterization. Echocardiogram  demonstrated normal ejection fraction, moderate diastolic dysfunction, moderate aortic stenosis and moderate pulmonary hypertension. His hospital course complicated by left wrist cellulitis with MSSA bacteremia requiring IV antibiotics as well as diarrhea with suspected C. difficile (toxin was negative).  Readmitted 1/15-1/21 with acute on chronic diastolic CHF in the setting of worsening (heme+) anemia.  He required transfusion with PRBCs x 1.  His Eliquis was held. He developed AKI with diuresis. Capsule endoscopy by gastroenterology demonstrated multiple small AVMs in the mid small bowel which was the likely source of his anemia. He was continued on dual antiplatelet therapy given recent PCI with DES the setting of ACS. Eliquis was held at discharge.  Discharge creatinine 1.75. Discharge Hgb 8.9. Discharge weight 249 (-26 pounds).  He returns for follow up. He is here today with his wife. Since DC, his weights have been stable. He denies significant dyspnea. He denies chest discomfort. He denies orthopnea, PND. LE edema remains improved. He denies syncope. He denies any bleeding issues. He continues to have left wrist discomfort since his cellulitis improved. He is no longer on antibiotics. He missed his follow-up with infectious disease due to his repeat admission.  CHADS2-VASc=5 (CHF, vascular dz, HTN, DM, > 76 yo).   Prior CV studies that were reviewed today include:    [facility] 07/29/16 LAD proximal 40, mid 10, D1 60 LCx proximal 30, distal 30, OM1 80 RA mean 9, RV 43/11, PA 44/12, mean 26; PCWP 18 AV mean gradient 12, calculated AVA 1.78 cm PCI:  2.25 x 38 mm Synergy DES to the OM1  Echo 08/01/16 Severe LVH, EF 60-65, normal wall motion, grade 2 diastolic dysfunction, moderate aortic stenosis (mean 20, peak 37), MAC, mild LAE, PASP 53  Echo 12/17 Moderate concentric LVH, EF 60-65, normal wall motion, grade 1 diastolic dysfunction, moderate aortic stenosis (mean 26), mild LAE, mild TR, PASP  52  Carotid US 5/17 Bilateral ICA 40-59, mild right subclavian steal Follow-up 1 year  LHC 4/16 LM:  widely patent. LAD:  widely patent; D1 50-70%  LCx:  Patent.  OM2 proximal eccentric 50-70%; mid CFX after origin of OM2 50% RI: widely patent. RCA:  widely patent.. INNOMINATE ANGIOGRAPHY: There is heavy calcification with at least 50% obstruction in the innominate artery LV gram EF 65%..  Echo 3/16 Mild LVH, EF 55-60, mild LAE  Myoview 3/16 No ischemia or scar, EF 49.  Intermediate Risk  Past Medical History:  Diagnosis Date  . Acute on chronic heart failure ([place])   . Allergy   . Arthritis   . AVM (arteriovenous malformation) of colon   . C. difficile enteritis   . CAD (coronary artery disease)    a. Cath 10/2014 - Mild to moderate diagonal, circumflex and obtuse marginal disease. Innominate artery sternosis.  . Carotid artery disease ([place])    a. Carotid dopple 03/2014 62-83% RICA &  15-17% LICA stenosis b. 6/16  . CKD (chronic kidney disease), stage III   . Clostridium difficile colitis 05/13/2016  . Dyspnea   . Dysrhythmia    ATRIAL FIBRILATION  . Elevated troponin    a. 09/2014 in setting of AF RVR-->Myoview: EF 49%, no ischemia/infarct->Med Rx.  . GERD (gastroesophageal reflux disease)   . Gout   . H/O transfusion of whole blood   . Heart murmur   . History of PFTs    PFTs 6/16:  FVC 3.73 (87%), FEV1 2.93 (88%), FEV1/FVC 78%, DLCO 69%  . Hx of adenomatous colonic polyps 07/02/2016  . Hyperlipidemia   . Hypertension   . Hypothyroidism   . Iron deficiency anemia   . PAF (paroxysmal atrial fibrillation) ([place])    a. 09/2014: Converted on Dilt;  b. CHA2DS2VASc = 3-->eliquis;  c. 09/2014 Echo: EF 55-60%, mild LVH, mildly dil LA.  [name] Personal history of colonic polyps 2007, 2008   adenoma each time, largest 12 mm in 2007  . Psoriasis   . Staph infection 07/2016   left hand   . Staphylococcus aureus bacteremia 07/31/2016  . Type II diabetes mellitus ([place])    TYPE 2     Past Surgical History:  Procedure Laterality Date  . APPENDECTOMY  02/09/2014  . CARDIAC CATHETERIZATION N/A 07/29/2016   Procedure: Right/Left Heart Cath and Coronary Angiography;  Surgeon: Nelva Bush, MD;  Location: [place] [facility];  Service: Cardiovascular;  Laterality: N/A;  . CARDIAC CATHETERIZATION N/A 07/29/2016   Procedure: Coronary Stent Intervention;  Surgeon: Nelva Bush, MD;  Location: [place] [facility];  Service: Cardiovascular;  Laterality: N/A;  . COLONOSCOPY  02/10/2011   internal hemorrhoids  . COLONOSCOPY W/ POLYPECTOMY  04/09/2006   12 mm adenoma  . COLONOSCOPY W/ POLYPECTOMY  06/17/2007   5 mm adenoma  . COLONOSCOPY WITH PROPOFOL N/A 05/13/2016   Procedure: COLONOSCOPY WITH PROPOFOL;  Surgeon: Manus Gunning, MD;  Location: [facility];  Service: Gastroenterology;  Laterality: N/A;  . CORONARY STENT PLACEMENT  07/29/2016   OMI    DES  . ENTEROSCOPY N/A 08/14/2016   Procedure: ENTEROSCOPY;  Surgeon:  Manus Gunning, MD;  Location: [place];  Service: Gastroenterology;  Laterality: N/A;  . ESOPHAGOGASTRODUODENOSCOPY  12/23/2011   Procedure: ESOPHAGOGASTRODUODENOSCOPY (EGD);  Surgeon: Irene Shipper, MD;  Location: [facility];  Service: Endoscopy;  Laterality: N/A;  with small bowel bx's  . GIVENS CAPSULE STUDY  12/28/2011  . GIVENS CAPSULE STUDY N/A 08/14/2016   Procedure: GIVENS CAPSULE STUDY;  Surgeon: Manus Gunning, MD;  Location: [place];  Service: Gastroenterology;  Laterality: N/A;  . KNEE ARTHROSCOPY Right   . LAPAROSCOPIC APPENDECTOMY N/A 02/09/2014   Procedure: APPENDECTOMY LAPAROSCOPIC;  Surgeon: Leighton Ruff, MD;  Location: [facility];  Service: General;  Laterality: N/A;  . LEFT HEART CATHETERIZATION WITH CORONARY ANGIOGRAM N/A 11/15/2014   Procedure: LEFT HEART CATHETERIZATION WITH CORONARY ANGIOGRAM;  Surgeon: Belva Crome, MD;  Location[facility];  Service: Cardiovascular;  Laterality: N/A;  . ROTATOR CUFF REPAIR      left    Current Medications: Current Meds  Medication Sig  . acetaminophen (TYLENOL) 325 MG tablet Take 2 tablets (650 mg total) by mouth every 4 (four) hours as needed for mild pain, fever or headache.  . allopurinol (ZYLOPRIM) 100 MG tablet Take 2 tablets (200 mg total) by mouth daily.  [name] amiodarone (PACERONE) 200 MG tablet TAKE 1 TABLET BY MOUTH DAILY  . aspirin 81 MG chewable tablet Chew 1 tablet (81 mg total) by mouth daily.  [name] atorvastatin (LIPITOR) 40 MG tablet Take 1 tablet (40 mg total) by mouth daily.  . Blood Glucose Monitoring Suppl (ONE TOUCH ULTRA 2) W/DEVICE KIT Use as directed Dx E11.9  . Blood Pressure Monitor KIT Use to check blood pressure daily Dx I10  . cephALEXin (KEFLEX) 500 MG capsule Take 1 capsule (500 mg total) by mouth 3 (three) times daily.  . Cholecalciferol 1000 UNITS TBDP Take 1,000-5,000 Units by mouth daily. Takes 1000 units everyday except for on Saturday patient takes 5000 units  . clopidogrel (PLAVIX) 75 MG tablet Take 1 tablet (75 mg total) by mouth daily with breakfast.  . diltiazem (CARDIZEM CD) 180 MG 24 hr capsule Take 1 capsule (180 mg total) by mouth daily.  . fenofibrate 160 MG tablet TAKE 1 TABLET(160 MG) BY MOUTH DAILY  . ferrous sulfate 325 (65 FE) MG tablet Take 325 mg by mouth 3 (three) times daily with meals.  . furosemide (LASIX) 20 MG tablet Take 2 tablets (40 mg total) by mouth daily.  [name] glipiZIDE (GLUCOTROL) 10 MG tablet Take 1 tablet (10 mg total) by mouth 2 (two) times daily before a meal.  . glucose blood (ONE TOUCH ULTRA TEST) test strip 1 each by Other route 4 (four) times daily. Use to check blood sugar four times a day  Dx: E11.9- insulin Dependant  . hydrALAZINE (APRESOLINE) 25 MG tablet Take 1 tablet (25 mg total) by mouth 3 (three) times daily.  [name] HYDROcodone-acetaminophen (NORCO/VICODIN) 5-325 MG tablet Take 1 tablet by mouth 2 (two) times daily as needed for moderate pain.  . Insulin Glargine (TOUJEO SOLOSTAR) 300  UNIT/ML SOPN Inject 52 Units into the skin daily.  . Insulin Pen Needle 31G X 5 MM MISC 1 Units by Does not apply route 4 (four) times daily.  . Lancets (ONETOUCH ULTRASOFT) lancets Use to check blood sugars daily  . levothyroxine (SYNTHROID, LEVOTHROID) 150 MCG tablet TAKE 1 TABLET BY MOUTH DAILY BEFORE BREAKFAST  . metoprolol 75 MG TABS Take 75 mg by mouth 2 (two) times daily.  [name] omeprazole (PRILOSEC) 40 MG  capsule TAKE 1 CAPSULE BY MOUTH EVERY DAY  . potassium chloride SA (K-DUR,KLOR-CON) 20 MEQ tablet Take 1 tablet (20 mEq total) by mouth daily.  . vancomycin (VANCOCIN) 125 MG capsule Take 1 capsule (125 mg total) by mouth 4 (four) times daily.     Allergies:   Percocet [oxycodone-acetaminophen]; Invokana [canagliflozin]; and Metformin and related   Social History   Social History  . Marital status: Married    Spouse name: N/A  . Number of children: N/A  . Years of education: 72   Occupational History  .  Retired    Social History Main Topics  . Smoking status: Former Smoker    Packs/day: 0.70    Years: 48.00    Types: Cigarettes    Quit date: 03/29/2006  . Smokeless tobacco: Never Used  . Alcohol use 0.6 oz/week    1 Cans of beer per week     Comment: occasional alcohol intake  . Drug use: No  . Sexual activity: Not Currently   Other Topics Concern  . None   Social History Narrative   Regular exercise-no   Caffeine Use-yes     Family History:  The patient's family history includes COPD in his sister; Cancer in his brother; Diabetes in his father; Heart attack in his father; Heart disease in his brother; Hypertension in his father and mother; Lung cancer in his father; Stroke in his father and mother.   ROS:   Please see the history of present illness.    ROS All other systems reviewed and are negative.   EKGs/Labs/Other Test Reviewed:    EKG:  EKG is  ordered today.  The ekg ordered today demonstrates Sinus bradycardia, HR 53, normal axis, Q-wave in lead 3, QTc  439 ms, no significant changes.   Recent Labs: 07/27/2016: TSH 4.452 08/10/2016: ALT 9; B Natriuretic Peptide 468.1 08/26/2016: BUN 33; Creatinine, Ser 1.75; Potassium 4.7; Sodium 143 09/21/2016: Hemoglobin 10.4; Platelets 201.0   Recent Lipid Panel    Component Value Date/Time   CHOL 101 08/01/2016 0440   TRIG 344 (H) 08/01/2016 0440   HDL 22 (L) 08/01/2016 0440   CHOLHDL 4.6 08/01/2016 0440   VLDL 69 (H) 08/01/2016 0440   LDLCALC 10 08/01/2016 0440   LDLDIRECT 26.0 03/20/2015 1003     Physical Exam:    VS:  BP 126/68   Pulse 66   Ht _0  (1.803 m)   Wt 252 lb 8 oz (114.5 kg)   SpO2 98%   BMI 35.22 kg/m     Wt Readings from Last 3 Encounters:  10/14/16 252 lb 8 oz (114.5 kg)  10/13/16 253 lb (114.8 kg)  09/22/16 252 lb 8 oz (114.5 kg)     Physical Exam  Constitutional: He is oriented to person, place, and time. He appears well-developed and well-nourished. No distress.  HENT:  Head: Normocephalic and atraumatic.  Eyes: No scleral icterus.  Neck: No JVD present.  Cardiovascular: Normal rate and regular rhythm.   Murmur heard.  Harsh systolic murmur is present with a grade of 2/6  at the upper right sternal border Pulmonary/Chest: Effort normal. He has no wheezes. He has no rales.  Abdominal: He exhibits no distension. There is no tenderness.  Musculoskeletal: He exhibits edema.  Trace bilateral LE edema  Neurological: He is alert and oriented to person, place, and time.  Skin: Skin is warm and dry.  Psychiatric: He has a normal mood and affect.    ASSESSMENT:  No diagnosis found. PLAN:    In order of problems listed above:  1. CAD - status post non-STEMI treated with DES to the OM1 07/29/16 . He had mild to moderate residual CAD in the LAD and LCx at catheterization.  He remains on dual antiplatelet therapy with aspirin and Plavix. Continue statin, beta blocker.  2. Chronic diastolic CHF - Volume remains stable after recent admission with acute on chronic  diastolic CHF in the setting of anemia. Continue current therapy.    3. Persistent AF - He maintains normal sinus rhythm on amiodarone. Unfortunately, Eliquis had to be discontinued in the setting of GI bleed. He has significant risk factors for stroke and would benefit from anticoagulation.  But, at this point his bleeding risk appears too great. Follow-up with GI is pending.  Would hold off on resuming Eliquis for now in light of need for antiplatelet therapy for recent PCI with DES in the setting of ACS. If GI clears him for anticoagulation in the future, we would need to discontinue aspirin and continue Plavix in addition to Eliquis.  4. History of GI bleed - This was in the setting of multiple small AVMs in the small bowel. Follow-up with GI as planned. Endo 08/19/16 with multiple small bowel AVM's Needs DAT for a year given DES to OM in January Should not be on triple Rx Will defer anticoagulation for now despite CHADVASC 5 After a year can use plavix and eliquis   5. HTN - Controlled.  6. HL - LDL was 10 in 1/18. Continue statin.  7. R Subclavian stenosis - Follow-up with Dr. Fletcher Anon as planned.    8. Aortic stenosis - Moderate by recent echocardiogram.  9. History of cellulitis - He had left wrist cellulitis during his first admission. He continues to have pain. He missed his follow-up with ID due to recurrent admission. Arrange follow-up with ID.  10. CKD - Obtain follow-up BMET today.   Medication Adjustments/Labs and Tests Ordered: Current medicines are reviewed at length with the patient today.  Concerns regarding medicines are outlined above.  Medication changes, Labs and Tests ordered today are outlined in the Patient Instructions noted below. Patient Instructions  Medication Instructions:  Your physician recommends that you continue on your current medications as directed. Please refer to the Current Medication list given to you  today.  Labwork: NONE  Testing/Procedures: Your physician has requested that you have an echocardiogram. Echocardiography is a painless test that uses sound waves to create images of your heart. It provides your doctor with information about the size and shape of your heart and how well your heart's chambers and valves are working. This procedure takes approximately one hour. There are no restrictions for this procedure.  Follow-Up: Your physician wants you to follow-up in: 6 months with Dr. Johnsie Cancel. You will receive a reminder letter in the mail two months in advance. If you don't receive a letter, please call our office to schedule the follow-up appointment.   If you need a refill on your cardiac medications before your next appointment, please call your pharmacy.     Signed, Michael Rouge, MD  10/14/2016 10:59 AM    [facility] [address], [place], [place]  [address] Phone: [phone]; Fax: [phone]

## 2016-10-09 ENCOUNTER — Telehealth (HOSPITAL_COMMUNITY): Payer: Self-pay | Admitting: *Deleted

## 2016-10-09 NOTE — Telephone Encounter (Signed)
Returned call to pt.  Message left for pt to please call cardiac rehab.  Contact information provided. Cherre Huger, BSN Cardiac and Training and development officer

## 2016-10-13 ENCOUNTER — Other Ambulatory Visit (INDEPENDENT_AMBULATORY_CARE_PROVIDER_SITE_OTHER): Payer: Medicare Other

## 2016-10-13 ENCOUNTER — Encounter: Payer: Self-pay | Admitting: Internal Medicine

## 2016-10-13 ENCOUNTER — Ambulatory Visit (INDEPENDENT_AMBULATORY_CARE_PROVIDER_SITE_OTHER): Payer: Medicare Other | Admitting: Internal Medicine

## 2016-10-13 VITALS — BP 130/60 | HR 56 | Temp 97.7°F | Resp 16 | Ht 71.0 in | Wt 253.0 lb

## 2016-10-13 DIAGNOSIS — E1151 Type 2 diabetes mellitus with diabetic peripheral angiopathy without gangrene: Secondary | ICD-10-CM

## 2016-10-13 DIAGNOSIS — I5032 Chronic diastolic (congestive) heart failure: Secondary | ICD-10-CM | POA: Diagnosis not present

## 2016-10-13 DIAGNOSIS — E781 Pure hyperglyceridemia: Secondary | ICD-10-CM | POA: Diagnosis not present

## 2016-10-13 DIAGNOSIS — Z23 Encounter for immunization: Secondary | ICD-10-CM

## 2016-10-13 DIAGNOSIS — E1140 Type 2 diabetes mellitus with diabetic neuropathy, unspecified: Secondary | ICD-10-CM

## 2016-10-13 DIAGNOSIS — I2510 Atherosclerotic heart disease of native coronary artery without angina pectoris: Secondary | ICD-10-CM | POA: Diagnosis not present

## 2016-10-13 DIAGNOSIS — Z9861 Coronary angioplasty status: Secondary | ICD-10-CM | POA: Diagnosis not present

## 2016-10-13 DIAGNOSIS — I808 Phlebitis and thrombophlebitis of other sites: Secondary | ICD-10-CM

## 2016-10-13 DIAGNOSIS — K558 Other vascular disorders of intestine: Secondary | ICD-10-CM

## 2016-10-13 DIAGNOSIS — E559 Vitamin D deficiency, unspecified: Secondary | ICD-10-CM | POA: Diagnosis not present

## 2016-10-13 DIAGNOSIS — K5521 Angiodysplasia of colon with hemorrhage: Secondary | ICD-10-CM

## 2016-10-13 LAB — HEMOGLOBIN A1C: Hgb A1c MFr Bld: 6 % (ref 4.6–6.5)

## 2016-10-13 NOTE — Assessment & Plan Note (Signed)
Vit D 

## 2016-10-13 NOTE — Assessment & Plan Note (Signed)
Furosemide Hydralazine

## 2016-10-13 NOTE — Assessment & Plan Note (Signed)
Po iron 

## 2016-10-13 NOTE — Assessment & Plan Note (Signed)
Monitor CBC 

## 2016-10-13 NOTE — Assessment & Plan Note (Signed)
[employer]

## 2016-10-13 NOTE — Assessment & Plan Note (Signed)
Diltiazem, Toprol, Furosemide, Hydralazine

## 2016-10-13 NOTE — Progress Notes (Signed)
Subjective:  Patient ID: Michael [name], male    DOB: [birth year]  Age: 76 y.o. MRN: [id number]  CC: Follow-up (HTN, Type 2 diabetes)   HPI Michael [name] presents for DM, HTN, gout f/u F/u L hand septic phlebitis - finishing Keflex and Vancomycin  Outpatient Medications Prior to Visit  Medication Sig Dispense Refill  . acetaminophen (TYLENOL) 325 MG tablet Take 2 tablets (650 mg total) by mouth every 4 (four) hours as needed for mild pain, fever or headache.    . allopurinol (ZYLOPRIM) 100 MG tablet Take 2 tablets (200 mg total) by mouth daily. 60 tablet 11  . amiodarone (PACERONE) 200 MG tablet TAKE 1 TABLET BY MOUTH DAILY 30 tablet 6  . aspirin 81 MG chewable tablet Chew 1 tablet (81 mg total) by mouth daily. 30 tablet 0  . atorvastatin (LIPITOR) 40 MG tablet Take 1 tablet (40 mg total) by mouth daily. 90 tablet 2  . Blood Glucose Monitoring Suppl (ONE TOUCH ULTRA 2) W/DEVICE KIT Use as directed Dx E11.9 1 each 0  . Blood Pressure Monitor KIT Use to check blood pressure daily Dx I10 1 each 0  . cephALEXin (KEFLEX) 500 MG capsule Take 1 capsule (500 mg total) by mouth 3 (three) times daily. 63 capsule 1  . Cholecalciferol 1000 UNITS TBDP Take 1,000-5,000 Units by mouth daily. Takes 1000 units everyday except for on Saturday patient takes 5000 units    . clopidogrel (PLAVIX) 75 MG tablet Take 1 tablet (75 mg total) by mouth daily with breakfast. 30 tablet 0  . diltiazem (CARDIZEM CD) 180 MG 24 hr capsule Take 1 capsule (180 mg total) by mouth daily. 30 capsule 10  . fenofibrate 160 MG tablet TAKE 1 TABLET(160 MG) BY MOUTH DAILY 90 tablet 3  . ferrous sulfate 325 (65 FE) MG tablet Take 325 mg by mouth 3 (three) times daily with meals.    . furosemide (LASIX) 20 MG tablet Take 2 tablets (40 mg total) by mouth daily. 60 tablet 3  . glipiZIDE (GLUCOTROL) 10 MG tablet Take 1 tablet (10 mg total) by mouth 2 (two) times daily before a meal. 60 tablet 5  . glucose blood (ONE TOUCH ULTRA TEST)  test strip 1 each by Other route 4 (four) times daily. Use to check blood sugar four times a day  Dx: E11.9- insulin Dependant 120 each 5  . hydrALAZINE (APRESOLINE) 25 MG tablet Take 1 tablet (25 mg total) by mouth 3 (three) times daily. 90 tablet 3  . HYDROcodone-acetaminophen (NORCO/VICODIN) 5-325 MG tablet Take 1 tablet by mouth 2 (two) times daily as needed for moderate pain. 60 tablet 0  . Insulin Glargine (TOUJEO SOLOSTAR) 300 UNIT/ML SOPN Inject 52 Units into the skin daily.    . Insulin Pen Needle 31G X 5 MM MISC 1 Units by Does not apply route 4 (four) times daily. 150 each 11  . Lancets (ONETOUCH ULTRASOFT) lancets Use to check blood sugars daily 30 each 11  . levothyroxine (SYNTHROID, LEVOTHROID) 150 MCG tablet TAKE 1 TABLET BY MOUTH DAILY BEFORE BREAKFAST 90 tablet 3  . metoprolol 75 MG TABS Take 75 mg by mouth 2 (two) times daily. 60 tablet 0  . omeprazole (PRILOSEC) 40 MG capsule TAKE 1 CAPSULE BY MOUTH EVERY DAY 90 capsule 3  . potassium chloride SA (K-DUR,KLOR-CON) 20 MEQ tablet Take 1 tablet (20 mEq total) by mouth daily. 30 tablet 3  . vancomycin (VANCOCIN) 125 MG capsule Take 1 capsule (125 mg total)  by mouth 4 (four) times daily. 112 capsule 0  . meclizine (ANTIVERT) 12.5 MG tablet Take 1 tablet (12.5 mg total) by mouth 2 (two) times daily as needed for dizziness (do not use for more than 3days). (Patient not taking: Reported on 10/13/2016) 6 tablet 0  . nitroGLYCERIN (NITROSTAT) 0.4 MG SL tablet Place 1 tablet (0.4 mg total) under the tongue every 5 (five) minutes as needed for chest pain. (Patient not taking: Reported on 10/13/2016) 20 tablet 3   No facility-administered medications prior to visit.     ROS Review of Systems  Constitutional: Positive for fatigue. Negative for appetite change and unexpected weight change.  HENT: Negative for congestion, nosebleeds, sneezing, sore throat and trouble swallowing.   Eyes: Negative for itching and visual disturbance.    Respiratory: Negative for cough.   Cardiovascular: Negative for chest pain, palpitations and leg swelling.  Gastrointestinal: Negative for abdominal distention, blood in stool, diarrhea and nausea.  Genitourinary: Negative for frequency and hematuria.  Musculoskeletal: Positive for arthralgias. Negative for back pain, gait problem, joint swelling and neck pain.  Skin: Negative for rash.  Neurological: Negative for dizziness, tremors, speech difficulty and weakness.  Psychiatric/Behavioral: Negative for agitation, dysphoric mood and sleep disturbance. The patient is not nervous/anxious.     Objective:  BP 130/60   Pulse (!) 56   Temp 97.7 F (36.5 C) (Oral)   Resp 16   Ht _0  (1.803 m)   Wt 253 lb (114.8 kg)   SpO2 97%   BMI 35.29 kg/m   BP Readings from Last 3 Encounters:  10/13/16 130/60  09/22/16 (!) 188/67  09/21/16 (!) 130/50    Wt Readings from Last 3 Encounters:  10/13/16 253 lb (114.8 kg)  09/22/16 252 lb 8 oz (114.5 kg)  09/21/16 254 lb (115.2 kg)    Physical Exam  Constitutional: He is oriented to person, place, and time. He appears well-developed. No distress.  NAD  HENT:  Mouth/Throat: Oropharynx is clear and moist.  Eyes: Conjunctivae are normal. Pupils are equal, round, and reactive to light.  Neck: Normal range of motion. No JVD present. No thyromegaly present.  Cardiovascular: Normal rate, regular rhythm, normal heart sounds and intact distal pulses.  Exam reveals no gallop and no friction rub.   No murmur heard. Pulmonary/Chest: Effort normal and breath sounds normal. No respiratory distress. He has no wheezes. He has no rales. He exhibits no tenderness.  Abdominal: Soft. Bowel sounds are normal. He exhibits no distension and no mass. There is no tenderness. There is no rebound and no guarding.  Musculoskeletal: Normal range of motion. He exhibits tenderness. He exhibits no edema.  Lymphadenopathy:    He has no cervical adenopathy.  Neurological:  He is alert and oriented to person, place, and time. He has normal reflexes. No cranial nerve deficit. He exhibits normal muscle tone. He displays a negative Romberg sign. Coordination and gait normal.  Skin: Skin is warm and dry. No rash noted. No erythema.  Psychiatric: He has a normal mood and affect. His behavior is normal. Judgment and thought content normal.  tender joints  Lab Results  Component Value Date   WBC 4.0 09/21/2016   HGB 10.4 (L) 09/21/2016   HCT 29.6 (L) 09/21/2016   PLT 201.0 09/21/2016   GLUCOSE 131 (H) 08/26/2016   CHOL 101 08/01/2016   TRIG 344 (H) 08/01/2016   HDL 22 (L) 08/01/2016   LDLDIRECT 26.0 03/20/2015   LDLCALC 10 08/01/2016   ALT 9 (  L) 08/10/2016   AST 23 08/10/2016   NA 143 08/26/2016   K 4.7 08/26/2016   CL 101 08/26/2016   CREATININE 1.75 (H) 08/26/2016   BUN 33 (H) 08/26/2016   CO2 20 08/26/2016   TSH 4.452 07/27/2016   PSA 0.50 11/07/2013   INR 1.01 07/29/2016   HGBA1C 7.1 (H) 07/27/2016   MICROALBUR 1.2 05/16/2013    Dg Wrist 2 Views Left  Result Date: 09/22/2016 CLINICAL DATA:  Septic thrombophlebitis of the upper extremity. Pain and swelling in the wrist. EXAM: LEFT WRIST - 2 VIEW COMPARISON:  07/30/2016 FINDINGS: The patient has arthritic changes of the wrist joints, stable. Arterial vascular calcification is noted around the wrist. No visible foreign bodies. No appreciable joint effusions. IMPRESSION: No acute abnormalities. Arthritic changes of the wrist and of the first metacarpal phalangeal joint, stable. Electronically Signed   By: Lorriane Shire M.D.   On: 09/22/2016 10:53    Assessment & Plan:   There are no diagnoses linked to this encounter. I am having Mr. Sheller maintain his ferrous sulfate, ONE TOUCH ULTRA 2, onetouch ultrasoft, Cholecalciferol, Blood Pressure Monitor, diltiazem, allopurinol, atorvastatin, levothyroxine, Insulin Pen Needle, glucose blood, omeprazole, amiodarone, meclizine, acetaminophen, aspirin,  clopidogrel, Metoprolol Tartrate, fenofibrate, hydrALAZINE, potassium chloride SA, furosemide, Insulin Glargine, nitroGLYCERIN, HYDROcodone-acetaminophen, cephALEXin, vancomycin, and glipiZIDE.  No orders of the defined types were placed in this encounter.    Follow-up: No Follow-up on file.  [name], MD

## 2016-10-13 NOTE — Addendum Note (Signed)
Addended by: Valere Dross on: 10/13/2016 01:49 PM   Modules accepted: Orders

## 2016-10-13 NOTE — Assessment & Plan Note (Signed)
On amlodipine, cardizem, Eliquis

## 2016-10-13 NOTE — Progress Notes (Signed)
Pre-visit discussion using our clinic review tool. No additional management support is needed unless otherwise documented below in the visit note.  

## 2016-10-13 NOTE — Assessment & Plan Note (Signed)
2018 L hand Dr Megan Salon f/u Keflex and Vanc po

## 2016-10-13 NOTE — Assessment & Plan Note (Signed)
On Vit D 

## 2016-10-13 NOTE — Assessment & Plan Note (Signed)
On Rx 

## 2016-10-13 NOTE — Assessment & Plan Note (Signed)
[place] 

## 2016-10-13 NOTE — Assessment & Plan Note (Signed)
No relapse Labs 

## 2016-10-13 NOTE — Assessment & Plan Note (Addendum)
Keflex

## 2016-10-14 ENCOUNTER — Ambulatory Visit (INDEPENDENT_AMBULATORY_CARE_PROVIDER_SITE_OTHER): Payer: Medicare Other | Admitting: Cardiovascular Disease

## 2016-10-14 ENCOUNTER — Encounter: Payer: Self-pay | Admitting: Cardiovascular Disease

## 2016-10-14 VITALS — BP 126/68 | HR 66 | Ht 71.0 in | Wt 252.5 lb

## 2016-10-14 DIAGNOSIS — I350 Nonrheumatic aortic (valve) stenosis: Secondary | ICD-10-CM | POA: Diagnosis not present

## 2016-10-14 DIAGNOSIS — Z9861 Coronary angioplasty status: Secondary | ICD-10-CM | POA: Diagnosis not present

## 2016-10-14 DIAGNOSIS — I2510 Atherosclerotic heart disease of native coronary artery without angina pectoris: Secondary | ICD-10-CM

## 2016-10-14 MED ORDER — CLOPIDOGREL BISULFATE 75 MG PO TABS: 75.0000 mg | ORAL_TABLET | Freq: Every day | ORAL | 0 refills | Status: DC

## 2016-10-14 NOTE — Patient Instructions (Addendum)

## 2016-10-16 ENCOUNTER — Other Ambulatory Visit: Payer: Self-pay | Admitting: *Deleted

## 2016-10-16 DIAGNOSIS — I2510 Atherosclerotic heart disease of native coronary artery without angina pectoris: Secondary | ICD-10-CM

## 2016-10-16 MED ORDER — CLOPIDOGREL BISULFATE 75 MG PO TABS: 75.0000 mg | ORAL_TABLET | Freq: Every day | ORAL | 3 refills | Status: DC

## 2016-10-16 NOTE — Telephone Encounter (Signed)
Patients wife called and stated that the patient was seen by Dr Johnsie Cancel this week and was told that an rx for clopidogrel would be sent in. She stated that they called the pharmacy and was informed that they did not receive it. I do not see where Dr Johnsie Cancel has ever refilled this for the patient. Okay to refill? Please advise. Thanks, MI

## 2016-10-16 NOTE — Telephone Encounter (Signed)
Sent into patients pharmacy  

## 2016-10-20 ENCOUNTER — Telehealth: Payer: Self-pay | Admitting: Internal Medicine

## 2016-10-20 NOTE — Telephone Encounter (Signed)
Called patient to schedule awv. Lvm for patient to call office to schedule appt.  °

## 2016-10-21 ENCOUNTER — Telehealth: Payer: Self-pay

## 2016-10-21 NOTE — Telephone Encounter (Signed)
-----   Message from Marlon Pel, RN sent at 09/23/2016 11:13 AM EST ----- Needs labs see results 2/28- [name]

## 2016-10-21 NOTE — Telephone Encounter (Signed)
Pt aware.

## 2016-10-22 ENCOUNTER — Other Ambulatory Visit (INDEPENDENT_AMBULATORY_CARE_PROVIDER_SITE_OTHER): Payer: Medicare Other

## 2016-10-22 DIAGNOSIS — D638 Anemia in other chronic diseases classified elsewhere: Secondary | ICD-10-CM | POA: Diagnosis not present

## 2016-10-22 LAB — CBC WITH DIFFERENTIAL/PLATELET
BASOS ABS: 0.1 10*3/uL (ref 0.0–0.1)
Basophils Relative: 1.2 % (ref 0.0–3.0)
EOS PCT: 4 % (ref 0.0–5.0)
Eosinophils Absolute: 0.2 10*3/uL (ref 0.0–0.7)
HEMATOCRIT: 31.9 % — AB (ref 39.0–52.0)
HEMOGLOBIN: 11.2 g/dL — AB (ref 13.0–17.0)
LYMPHS ABS: 1.2 10*3/uL (ref 0.7–4.0)
LYMPHS PCT: 26.7 % (ref 12.0–46.0)
MCHC: 35.1 g/dL (ref 30.0–36.0)
MCV: 87.5 fl (ref 78.0–100.0)
MONOS PCT: 7.1 % (ref 3.0–12.0)
Monocytes Absolute: 0.3 10*3/uL (ref 0.1–1.0)
Neutro Abs: 2.8 10*3/uL (ref 1.4–7.7)
Neutrophils Relative %: 61 % (ref 43.0–77.0)
Platelets: 163 10*3/uL (ref 150.0–400.0)
RBC: 3.65 Mil/uL — ABNORMAL LOW (ref 4.22–5.81)
RDW: 17.6 % — ABNORMAL HIGH (ref 11.5–15.5)
WBC: 4.6 10*3/uL (ref 4.0–10.5)

## 2016-10-22 NOTE — Progress Notes (Signed)
Hemoglobin is better - good news Repeat CBC in 2 mos Dx anemia of chronic disease

## 2016-10-26 ENCOUNTER — Other Ambulatory Visit: Payer: Self-pay

## 2016-10-26 DIAGNOSIS — D638 Anemia in other chronic diseases classified elsewhere: Secondary | ICD-10-CM

## 2016-10-28 ENCOUNTER — Ambulatory Visit (HOSPITAL_BASED_OUTPATIENT_CLINIC_OR_DEPARTMENT_OTHER): Payer: Medicare Other | Attending: Cardiovascular Disease | Admitting: Cardiovascular Disease

## 2016-10-28 VITALS — Ht 71.0 in | Wt 245.0 lb

## 2016-10-28 DIAGNOSIS — R0683 Snoring: Secondary | ICD-10-CM | POA: Diagnosis not present

## 2016-10-28 DIAGNOSIS — E119 Type 2 diabetes mellitus without complications: Secondary | ICD-10-CM | POA: Diagnosis not present

## 2016-10-28 DIAGNOSIS — G479 Sleep disorder, unspecified: Secondary | ICD-10-CM | POA: Insufficient documentation

## 2016-10-28 DIAGNOSIS — I480 Paroxysmal atrial fibrillation: Secondary | ICD-10-CM

## 2016-10-28 DIAGNOSIS — I10 Essential (primary) hypertension: Secondary | ICD-10-CM | POA: Diagnosis not present

## 2016-10-28 DIAGNOSIS — G4733 Obstructive sleep apnea (adult) (pediatric): Secondary | ICD-10-CM

## 2016-10-28 DIAGNOSIS — E669 Obesity, unspecified: Secondary | ICD-10-CM | POA: Insufficient documentation

## 2016-10-28 DIAGNOSIS — G478 Other sleep disorders: Secondary | ICD-10-CM

## 2016-11-02 ENCOUNTER — Ambulatory Visit (INDEPENDENT_AMBULATORY_CARE_PROVIDER_SITE_OTHER): Payer: Medicare Other | Admitting: Internal Medicine

## 2016-11-02 ENCOUNTER — Encounter: Payer: Self-pay | Admitting: Internal Medicine

## 2016-11-02 DIAGNOSIS — I808 Phlebitis and thrombophlebitis of other sites: Secondary | ICD-10-CM | POA: Diagnosis not present

## 2016-11-02 DIAGNOSIS — I2510 Atherosclerotic heart disease of native coronary artery without angina pectoris: Secondary | ICD-10-CM | POA: Diagnosis not present

## 2016-11-02 DIAGNOSIS — Z9861 Coronary angioplasty status: Secondary | ICD-10-CM | POA: Diagnosis not present

## 2016-11-02 DIAGNOSIS — Z8619 Personal history of other infectious and parasitic diseases: Secondary | ICD-10-CM

## 2016-11-02 NOTE — Assessment & Plan Note (Signed)
Hopefully his C. difficile relapse has been cured.

## 2016-11-02 NOTE — Assessment & Plan Note (Signed)
His septic thrombophlebitis and MSSA bacteremia appeared to have been cured.

## 2016-11-02 NOTE — Progress Notes (Signed)
[facility] for Infectious Disease  Patient Active Problem List   Diagnosis Date Noted  . Septic thrombophlebitis of upper extremity 07/31/2016    Priority: High  . History of Clostridium difficile colitis 07/31/2016    Priority: Medium  . History of GI bleed 08/26/2016  . Septic phlebitis of upper extremity 08/26/2016  . Coronary artery disease involving native coronary artery of native heart without angina pectoris 07/31/2016  . PAD (peripheral artery disease) ([place]) 07/31/2016  . Nonrheumatic aortic valve stenosis   . Chronic diastolic CHF (congestive heart failure) ([place]) 07/27/2016  . Hx of adenomatous colonic polyps 07/02/2016  . Shortness of breath 06/05/2016  . Hypothyroidism 03/17/2016  . Noncompliance with diet and medication regimen 12/16/2015  . Frequency-urgency syndrome 03/08/2015  . Subclavian artery stenosis, right ([place]) 01/16/2015  . Cholelithiasis 01/16/2015  . Restless leg syndrome 01/10/2015  . Postherpetic neuralgia 12/18/2014  . Persistent atrial fibrillation ([place])   . Hyperlipidemia   . Essential hypertension   . Psoriasis 03/19/2014  . Vitamin D deficiency 11/13/2013  . Carotid stenosis 11/06/2013  . Psoriatic arthritis ([place]) 11/06/2013  . Hypertriglyceridemia 05/16/2013  . Morbid obesity due to excess calories ([place]) 09/10/2012  . Gout 09/10/2012  . AVM (arteriovenous malformation) of small bowel, acquired with hemorrhage ([place]) 01/20/2012  . Iron deficiency anemia due to chronic blood loss 01/20/2012  . DM (diabetes mellitus), type 2 with peripheral vascular complications ([place]) 00/71/2197    Patient's Medications  New Prescriptions   No medications on file  Previous Medications   ACETAMINOPHEN (TYLENOL) 325 MG TABLET    Take 2 tablets (650 mg total) by mouth every 4 (four) hours as needed for mild pain, fever or headache.   ALLOPURINOL (ZYLOPRIM) 100 MG TABLET    Take 2 tablets (200 mg total) by mouth daily.   AMIODARONE (PACERONE)  200 MG TABLET    TAKE 1 TABLET BY MOUTH DAILY   ASPIRIN 81 MG CHEWABLE TABLET    Chew 1 tablet (81 mg total) by mouth daily.   ATORVASTATIN (LIPITOR) 40 MG TABLET    Take 1 tablet (40 mg total) by mouth daily.   BLOOD GLUCOSE MONITORING SUPPL (ONE TOUCH ULTRA 2) W/DEVICE KIT    Use as directed Dx E11.9   BLOOD PRESSURE MONITOR KIT    Use to check blood pressure daily Dx I10   CHOLECALCIFEROL 1000 UNITS TBDP    Take 1,000-5,000 Units by mouth daily. Takes 1000 units everyday except for on Saturday patient takes 5000 units   CLOPIDOGREL (PLAVIX) 75 MG TABLET    Take 1 tablet (75 mg total) by mouth daily with breakfast.   DILTIAZEM (CARDIZEM CD) 180 MG 24 HR CAPSULE    Take 1 capsule (180 mg total) by mouth daily.   FENOFIBRATE 160 MG TABLET    TAKE 1 TABLET(160 MG) BY MOUTH DAILY   FERROUS SULFATE 325 (65 FE) MG TABLET    Take 325 mg by mouth 3 (three) times daily with meals.   FUROSEMIDE (LASIX) 20 MG TABLET    Take 2 tablets (40 mg total) by mouth daily.   GLIPIZIDE (GLUCOTROL) 10 MG TABLET    Take 1 tablet (10 mg total) by mouth 2 (two) times daily before a meal.   GLUCOSE BLOOD (ONE TOUCH ULTRA TEST) TEST STRIP    1 each by Other route 4 (four) times daily. Use to check blood sugar four times a day  Dx: E11.9- insulin Dependant  HYDRALAZINE (APRESOLINE) 25 MG TABLET    Take 1 tablet (25 mg total) by mouth 3 (three) times daily.   HYDROCODONE-ACETAMINOPHEN (NORCO/VICODIN) 5-325 MG TABLET    Take 1 tablet by mouth 2 (two) times daily as needed for moderate pain.   INSULIN GLARGINE (TOUJEO SOLOSTAR) 300 UNIT/ML SOPN    Inject 52 Units into the skin daily.   INSULIN PEN NEEDLE 31G X 5 MM MISC    1 Units by Does not apply route 4 (four) times daily.   LANCETS (ONETOUCH ULTRASOFT) LANCETS    Use to check blood sugars daily   LEVOTHYROXINE (SYNTHROID, LEVOTHROID) 150 MCG TABLET    TAKE 1 TABLET BY MOUTH DAILY BEFORE BREAKFAST   METOPROLOL 75 MG TABS    Take 75 mg by mouth 2 (two) times daily.    OMEPRAZOLE (PRILOSEC) 40 MG CAPSULE    TAKE 1 CAPSULE BY MOUTH EVERY DAY   POTASSIUM CHLORIDE SA (K-DUR,KLOR-CON) 20 MEQ TABLET    Take 1 tablet (20 mEq total) by mouth daily.  Modified Medications   No medications on file  Discontinued Medications   CEPHALEXIN (KEFLEX) 500 MG CAPSULE    Take 1 capsule (500 mg total) by mouth 3 (three) times daily.   VANCOMYCIN (VANCOCIN) 125 MG CAPSULE    Take 1 capsule (125 mg total) by mouth 4 (four) times daily.    Subjective: [name] is in for his routine follow-up visit. He was hospitalized in January with left wrist septic thrombophlebitis at the site of a previous IV and MSSA bacteremia. He had no evidence of endocarditis. He received IV cefazolin for 2 and half weeks completing therapy on 08/18/2016. He was also treated for a mild relapse of C. difficile colitis. I saw him back in clinic on 09/22/2016 and he still had some evidence of thrombophlebitis. I started him back on oral cephalexin and oral vancomycin. He completed that therapy about 2 weeks ago. He has not had any fever, chills or sweats. He is not having any diarrhea. The pain and swelling over his left wrist has subsided. He is feeling back to normal.  Review of Systems: Review of Systems  Constitutional: Negative for chills, diaphoresis, fever and weight loss.  Gastrointestinal: Negative for abdominal pain, diarrhea, nausea and vomiting.  Musculoskeletal: Negative for joint pain.    Past Medical History:  Diagnosis Date  . Acute on chronic heart failure ([place])   . Allergy   . Arthritis   . AVM (arteriovenous malformation) of colon   . C. difficile enteritis   . CAD (coronary artery disease)    a. Cath 10/2014 - Mild to moderate diagonal, circumflex and obtuse marginal disease. Innominate artery sternosis.  . Carotid artery disease ([place])    a. Carotid dopple 03/2014 18-84% RICA &  16-60% LICA stenosis b. 6/30  . CKD (chronic kidney disease), stage III   . Clostridium difficile  colitis 05/13/2016  . Dyspnea   . Dysrhythmia    ATRIAL FIBRILATION  . Elevated troponin    a. 09/2014 in setting of AF RVR-->Myoview: EF 49%, no ischemia/infarct->Med Rx.  . GERD (gastroesophageal reflux disease)   . Gout   . H/O transfusion of whole blood   . Heart murmur   . History of PFTs    PFTs 6/16:  FVC 3.73 (87%), FEV1 2.93 (88%), FEV1/FVC 78%, DLCO 69%  . Hx of adenomatous colonic polyps 07/02/2016  . Hyperlipidemia   . Hypertension   . Hypothyroidism   . Iron deficiency anemia   .  PAF (paroxysmal atrial fibrillation) ([place])    a. 09/2014: Converted on Dilt;  b. CHA2DS2VASc = 3-->eliquis;  c. 09/2014 Echo: EF 55-60%, mild LVH, mildly dil LA.  [name] Personal history of colonic polyps 2007, 2008   adenoma each time, largest 12 mm in 2007  . Psoriasis   . Staph infection 07/2016   left hand   . Staphylococcus aureus bacteremia 07/31/2016  . Type II diabetes mellitus ([place])    TYPE 2    Social History  Substance Use Topics  . Smoking status: Former Smoker    Packs/day: 0.70    Years: 48.00    Types: Cigarettes    Quit date: 03/29/2006  . Smokeless tobacco: Never Used  . Alcohol use 0.6 oz/week    1 Cans of beer per week     Comment: occasional alcohol intake    Family History  Problem Relation Age of Onset  . Heart attack Father   . Lung cancer Father   . Diabetes Father   . Stroke Father   . Hypertension Father   . Stroke Mother   . Hypertension Mother   . Cancer Brother     liver  . Heart disease Brother     chf  . COPD Sister   . Malignant hyperthermia Neg Hx     Allergies  Allergen Reactions  . Percocet [Oxycodone-Acetaminophen] Nausea And Vomiting  . Invokana [Canagliflozin]     Side effects  . Metformin And Related     Upset stomach    Objective: Vitals:   11/02/16 1534  BP: (!) 180/63  Pulse: 60  Temp: 97.4 F (36.3 C)  TempSrc: Oral  Weight: 251 lb (113.9 kg)  Height: 5' 11"  (1.803 m)   Body mass index is 35.01 kg/m.  Physical Exam    Constitutional: He is oriented to person, place, and time. No distress.  Cardiovascular: Normal rate and regular rhythm.   Murmur heard. No change in the harsh 2/6 systolic murmur heard best at the right upper sternal border.  Musculoskeletal: Normal range of motion. He exhibits no edema or tenderness.  Neurological: He is alert and oriented to person, place, and time.  Skin: No rash noted.  Psychiatric: Mood and affect normal.    Lab Results    Problem List Items Addressed This Visit      High   Septic thrombophlebitis of upper extremity    His septic thrombophlebitis and MSSA bacteremia appeared to have been cured.        Medium   History of Clostridium difficile colitis    Hopefully his C. difficile relapse has been cured.          Michel Bickers, MD [facility] for [facility] [phone] pager   [phone] cell 11/02/2016, 3:50 PM

## 2016-11-05 ENCOUNTER — Other Ambulatory Visit: Payer: Self-pay | Admitting: *Deleted

## 2016-11-05 MED ORDER — METOPROLOL TARTRATE 75 MG PO TABS: 75.0000 mg | ORAL_TABLET | Freq: Two times a day (BID) | ORAL | 3 refills | Status: DC

## 2016-11-06 ENCOUNTER — Telehealth: Payer: Self-pay | Admitting: *Deleted

## 2016-11-06 ENCOUNTER — Telehealth: Payer: Self-pay | Admitting: Cardiovascular Disease

## 2016-11-06 NOTE — Telephone Encounter (Signed)
New message    Pt wife is calling back saying she missed a call. She said to please call the house phone and leave a message.

## 2016-11-06 NOTE — Telephone Encounter (Signed)
**Note De-Identified  Obfuscation** See phone note from 12:01 today.

## 2016-11-06 NOTE — Telephone Encounter (Addendum)
Cannot reach the pt at his home or cell phone.  I have done the PA through covermymeds and got an approval. I called the pts pharmacy, [facility], and was advised that they ran RX through and it is approved for three 25 mg tablets (75 mg) BID.

## 2016-11-06 NOTE — Telephone Encounter (Signed)
The pt and his wife are aware.

## 2016-11-09 NOTE — Telephone Encounter (Signed)
Received a form to fill out concerning PA for the pts Metoprolol 75 mg BID from [employer]. I completed form, Dr Johnsie Cancel signed and I faxed form back to [facility].

## 2016-11-14 NOTE — Procedures (Signed)
Patient Name: Michael [name], Michael [name] Date: 10/28/2016 Gender: Male D.O.B: 1940/09/29 Age (years): 57 Referring Provider: Shelva Majestic MD, ABSM Height (inches): 71 Interpreting Physician: Shelva Majestic MD, ABSM Weight (lbs): 245 RPSGT: Carolin Coy BMI: 34 MRN: [id number] Neck Size: 18.50  CLINICAL INFORMATION Sleep Study Type: NPSG  Indication for sleep study: Diabetes, Hypertension, Obesity, Snoring, PAF, CAD  Epworth Sleepiness Score: 3  SLEEP STUDY TECHNIQUE As per the AASM Manual for the Scoring of Sleep and Associated Events v2.3 (April 2016) with a hypopnea requiring 4% desaturations.  The channels recorded and monitored were frontal, central and occipital EEG, electrooculogram (EOG), submentalis EMG (chin), nasal and oral airflow, thoracic and abdominal wall motion, anterior tibialis EMG, snore microphone, electrocardiogram, and pulse oximetry.  MEDICATIONS acetaminophen (TYLENOL) 325 MG tablet allopurinol (ZYLOPRIM) 100 MG tablet amiodarone (PACERONE) 200 MG tablet aspirin 81 MG chewable tablet atorvastatin (LIPITOR) 40 MG tablet Blood Glucose Monitoring Suppl (ONE TOUCH ULTRA 2) W/DEVICE KIT Blood Pressure Monitor KIT Cholecalciferol 1000 UNITS TBDP clopidogrel (PLAVIX) 75 MG tablet diltiazem (CARDIZEM CD) 180 MG 24 hr capsule fenofibrate 160 MG tablet ferrous sulfate 325 (65 FE) MG tablet furosemide (LASIX) 20 MG tablet glipiZIDE (GLUCOTROL) 10 MG tablet glucose blood (ONE TOUCH ULTRA TEST) test strip hydrALAZINE (APRESOLINE) 25 MG tablet HYDROcodone-acetaminophen (NORCO/VICODIN) 5-325 MG tablet Insulin Glargine (TOUJEO SOLOSTAR) 300 UNIT/ML SOPN Insulin Pen Needle 31G X 5 MM MISC Lancets (ONETOUCH ULTRASOFT) lancets levothyroxine (SYNTHROID, LEVOTHROID) 150 MCG tablet Metoprolol Tartrate 75 MG TABS omeprazole (PRILOSEC) 40 MG capsule potassium chloride SA (K-DUR,KLOR-CON) 20 MEQ tablet  Medications self-administered by patient taken the night of  the study : N/A  SLEEP ARCHITECTURE The study was initiated at 10:43:14 PM and ended at 5:17:18 AM.  Sleep onset time was 6.9 minutes and the sleep efficiency was 81.4%. The total sleep time was 320.6 minutes. Wake after sleep onset (WASO) was 66.5  minutes  Stage REM latency was 61.0 minutes.  The patient spent 12.63% of the night in stage N1 sleep, 66.78% in stage N2 sleep, 0.00% in stage N3 and 20.59% in REM.  Alpha intrusion was absent.  Supine sleep was 29.93%.  RESPIRATORY PARAMETERS The overall apnea/hypopnea index (AHI) was 7.9 per hour. The respiratory disturbance index (RDI) was 12.7 per hour. here were 6 total apneas, including 4 obstructive, 2 central and 0 mixed apneas. There were 36 hypopneas and 26 RERAs.  The AHI during Stage REM sleep was 17.3 per hour.  AHI while supine was 5.0 per hour.  The mean oxygen saturation was 95.60%. The minimum SpO2 during sleep was 81.00%.  Moderate snoring was noted during this study.  CARDIAC DATA The 2 lead EKG demonstrated sinus rhythm. The mean heart rate was 54.04 beats per minute. Other EKG findings include: PVCs.  LEG MOVEMENT DATA The total PLMS were 406 with a resulting PLMS index of 75.98. Associated arousal with leg movement index was 6.2 .  IMPRESSIONS - Mild obstructive sleep apnea overall (AHI 7.9/h; RDI 12.7/h); however, sleep apnea was moderate with REM sleep ( AHI 17.3/h) - No significant central sleep apnea occurred during this study (CAI = 0.4/h). - Moderate oxygen desaturation to a nadir of 81%. - The patient snored with Moderate snoring volume. - EKG findings include PVCs. - Severe periodic limb movements of sleep occurred during the study. Associated arousals were significant.  DIAGNOSIS - Obstructive Sleep Apnea (327.23 [G47.33 ICD-10])  RECOMMENDATIONS - In this patient with significant cardiovascular co-morbidities, mild overall and moderate REM related sleep apnea, with  significant oxygen  desaturation recommend therapeutic CPAP titration study for  optimal management of his sleep disordered breathing. - Efforts should be made to optimize nasal and oropharyngeal patency. - Avoid alcohol, sedatives and other CNS depressants that may worsen sleep apnea and disrupt normal sleep architecture. - Sleep hygiene should be reviewed to assess factors that may improve sleep quality. - Weight management (BMI 34) and regular exercise should be initiated or continued if appropriate.  [Electronically signed] 11/14/2016 05:18 PM  Shelva Majestic MD, [facility], [place], American Board of Sleep Medicine   NPI: [id number] [place] PH: [phone]   FX: [phone] [place]

## 2016-11-27 ENCOUNTER — Telehealth: Payer: Self-pay | Admitting: *Deleted

## 2016-11-27 ENCOUNTER — Other Ambulatory Visit: Payer: Self-pay | Admitting: *Deleted

## 2016-11-27 DIAGNOSIS — G4733 Obstructive sleep apnea (adult) (pediatric): Secondary | ICD-10-CM

## 2016-11-27 NOTE — Telephone Encounter (Signed)
11/27/16 LMTCB

## 2016-11-27 NOTE — Telephone Encounter (Signed)
LMTCB

## 2016-11-27 NOTE — Telephone Encounter (Signed)
-----   Message from Troy Sine, MD sent at 11/14/2016  5:51 PM EDT ----- [name] please schedule for CPAP TITRATION STUDY

## 2016-11-30 ENCOUNTER — Other Ambulatory Visit: Payer: Self-pay | Admitting: Cardiovascular Disease

## 2016-11-30 NOTE — Telephone Encounter (Signed)
Refill Request.  

## 2016-12-08 ENCOUNTER — Encounter (HOSPITAL_COMMUNITY): Payer: Self-pay | Admitting: Emergency Medicine

## 2016-12-08 ENCOUNTER — Emergency Department (HOSPITAL_COMMUNITY): Payer: Medicare Other

## 2016-12-08 ENCOUNTER — Emergency Department (HOSPITAL_COMMUNITY)
Admission: EM | Admit: 2016-12-08 | Discharge: 2016-12-08 | Disposition: A | Discharge status: Home / Self Care | Payer: Medicare Other | Attending: Emergency Medicine | Admitting: Emergency Medicine

## 2016-12-08 DIAGNOSIS — R002 Palpitations: Secondary | ICD-10-CM | POA: Insufficient documentation

## 2016-12-08 DIAGNOSIS — Z5321 Procedure and treatment not carried out due to patient leaving prior to being seen by health care provider: Secondary | ICD-10-CM | POA: Insufficient documentation

## 2016-12-08 DIAGNOSIS — R0600 Dyspnea, unspecified: Secondary | ICD-10-CM | POA: Diagnosis not present

## 2016-12-08 LAB — BASIC METABOLIC PANEL
Anion gap: 10 (ref 5–15)
BUN: 27 mg/dL — AB (ref 6–20)
CALCIUM: 8.9 mg/dL (ref 8.9–10.3)
CO2: 24 mmol/L (ref 22–32)
Chloride: 104 mmol/L (ref 101–111)
Creatinine, Ser: 1.46 mg/dL — ABNORMAL HIGH (ref 0.61–1.24)
GFR calc Af Amer: 53 mL/min — ABNORMAL LOW (ref >60)
GFR, EST NON AFRICAN AMERICAN: 46 mL/min — AB (ref >60)
GLUCOSE: 171 mg/dL — AB (ref 65–99)
Potassium: 3.8 mmol/L (ref 3.5–5.1)
Sodium: 138 mmol/L (ref 135–145)

## 2016-12-08 LAB — CBC WITH DIFFERENTIAL/PLATELET
BASOS ABS: 0 10*3/uL (ref 0.0–0.1)
Basophils Relative: 0 %
EOS PCT: 3 %
Eosinophils Absolute: 0.1 10*3/uL (ref 0.0–0.7)
HEMATOCRIT: 32 % — AB (ref 39.0–52.0)
Hemoglobin: 10.8 g/dL — ABNORMAL LOW (ref 13.0–17.0)
LYMPHS ABS: 0.7 10*3/uL (ref 0.7–4.0)
LYMPHS PCT: 17 %
MCH: 30.4 pg (ref 26.0–34.0)
MCHC: 33.8 g/dL (ref 30.0–36.0)
MCV: 90.1 fL (ref 78.0–100.0)
MONO ABS: 0.3 10*3/uL (ref 0.1–1.0)
Monocytes Relative: 7 %
NEUTROS ABS: 2.9 10*3/uL (ref 1.7–7.7)
Neutrophils Relative %: 73 %
PLATELETS: 160 10*3/uL (ref 150–400)
RBC: 3.55 MIL/uL — ABNORMAL LOW (ref 4.22–5.81)
RDW: 16.5 % — AB (ref 11.5–15.5)
WBC: 4 10*3/uL (ref 4.0–10.5)

## 2016-12-08 LAB — PROTIME-INR
INR: 1.06
Prothrombin Time: 13.8 seconds (ref 11.4–15.2)

## 2016-12-08 LAB — I-STAT TROPONIN, ED: Troponin i, poc: 0.02 ng/mL (ref 0.00–0.08)

## 2016-12-08 NOTE — ED Triage Notes (Signed)
Patient's wife came up to nurses station, stating that since the patient's HR went down, they both feel like he can go home. Patient is not symptomatic - his HR went from above 100 (A-Fib) down to 50 upon reassessment. Patient has steady gait and no complaints, other than the wait time. Patient states he will come back if he needs to.

## 2016-12-08 NOTE — ED Triage Notes (Signed)
Patient woke up this evening with palpitations , denies chest pain or SOB , history of CAD/coronary stent and Afib , his cardiologist is Dr. Johnsie Cancel.

## 2016-12-16 ENCOUNTER — Other Ambulatory Visit: Payer: Self-pay | Admitting: Internal Medicine

## 2016-12-16 DIAGNOSIS — I739 Peripheral vascular disease, unspecified: Principal | ICD-10-CM

## 2016-12-16 DIAGNOSIS — I779 Disorder of arteries and arterioles, unspecified: Secondary | ICD-10-CM

## 2016-12-22 ENCOUNTER — Encounter (HOSPITAL_COMMUNITY): Payer: Self-pay | Admitting: Emergency Medicine

## 2016-12-22 ENCOUNTER — Inpatient Hospital Stay (HOSPITAL_COMMUNITY)
Admission: EM | Admit: 2016-12-22 | Discharge: 2016-12-24 | DRG: 291 | Disposition: A | Discharge status: Home / Self Care | Payer: Medicare Other | Attending: Family Medicine | Admitting: Family Medicine

## 2016-12-22 DIAGNOSIS — Z955 Presence of coronary angioplasty implant and graft: Secondary | ICD-10-CM

## 2016-12-22 DIAGNOSIS — N183 Chronic kidney disease, stage 3 (moderate): Secondary | ICD-10-CM | POA: Diagnosis present

## 2016-12-22 DIAGNOSIS — E1122 Type 2 diabetes mellitus with diabetic chronic kidney disease: Secondary | ICD-10-CM | POA: Diagnosis present

## 2016-12-22 DIAGNOSIS — I110 Hypertensive heart disease with heart failure: Secondary | ICD-10-CM | POA: Diagnosis not present

## 2016-12-22 DIAGNOSIS — I5033 Acute on chronic diastolic (congestive) heart failure: Secondary | ICD-10-CM | POA: Diagnosis not present

## 2016-12-22 DIAGNOSIS — Z7982 Long term (current) use of aspirin: Secondary | ICD-10-CM

## 2016-12-22 DIAGNOSIS — I2510 Atherosclerotic heart disease of native coronary artery without angina pectoris: Secondary | ICD-10-CM | POA: Diagnosis present

## 2016-12-22 DIAGNOSIS — I252 Old myocardial infarction: Secondary | ICD-10-CM

## 2016-12-22 DIAGNOSIS — R7989 Other specified abnormal findings of blood chemistry: Secondary | ICD-10-CM | POA: Diagnosis not present

## 2016-12-22 DIAGNOSIS — I350 Nonrheumatic aortic (valve) stenosis: Secondary | ICD-10-CM | POA: Diagnosis present

## 2016-12-22 DIAGNOSIS — Z87891 Personal history of nicotine dependence: Secondary | ICD-10-CM | POA: Diagnosis not present

## 2016-12-22 DIAGNOSIS — I130 Hypertensive heart and chronic kidney disease with heart failure and stage 1 through stage 4 chronic kidney disease, or unspecified chronic kidney disease: Secondary | ICD-10-CM | POA: Diagnosis not present

## 2016-12-22 DIAGNOSIS — I481 Persistent atrial fibrillation: Secondary | ICD-10-CM | POA: Diagnosis not present

## 2016-12-22 DIAGNOSIS — Z888 Allergy status to other drugs, medicaments and biological substances status: Secondary | ICD-10-CM

## 2016-12-22 DIAGNOSIS — Z794 Long term (current) use of insulin: Secondary | ICD-10-CM

## 2016-12-22 DIAGNOSIS — R069 Unspecified abnormalities of breathing: Secondary | ICD-10-CM | POA: Diagnosis not present

## 2016-12-22 DIAGNOSIS — R778 Other specified abnormalities of plasma proteins: Secondary | ICD-10-CM

## 2016-12-22 DIAGNOSIS — R0602 Shortness of breath: Secondary | ICD-10-CM | POA: Diagnosis not present

## 2016-12-22 DIAGNOSIS — I10 Essential (primary) hypertension: Secondary | ICD-10-CM | POA: Diagnosis present

## 2016-12-22 DIAGNOSIS — R748 Abnormal levels of other serum enzymes: Secondary | ICD-10-CM | POA: Diagnosis not present

## 2016-12-22 DIAGNOSIS — I248 Other forms of acute ischemic heart disease: Secondary | ICD-10-CM | POA: Diagnosis present

## 2016-12-22 DIAGNOSIS — E1151 Type 2 diabetes mellitus with diabetic peripheral angiopathy without gangrene: Secondary | ICD-10-CM | POA: Diagnosis present

## 2016-12-22 DIAGNOSIS — I480 Paroxysmal atrial fibrillation: Secondary | ICD-10-CM | POA: Diagnosis present

## 2016-12-22 DIAGNOSIS — I4819 Other persistent atrial fibrillation: Secondary | ICD-10-CM | POA: Diagnosis present

## 2016-12-22 DIAGNOSIS — Z79899 Other long term (current) drug therapy: Secondary | ICD-10-CM

## 2016-12-22 NOTE — ED Triage Notes (Signed)
Per EMS, pt from home with increased shortness of breath. Hx CHF, pt also states he has felt like he has the urge to belch, and last time he felt this way, he was having an MI. EMS vitals: BP-170/80, HR-58, SpO2-99% room air.

## 2016-12-22 NOTE — ED Provider Notes (Signed)
[place] DEPT Provider Note   CSN: [id number] Arrival date & time: 12/22/16  2339  By signing my name below, I, Dora Sims, attest that this documentation has been prepared under the direction and in the presence of physician practitioner, Julane Crock, Annie Main, MD. Electronically Signed: Dora Sims, Scribe. 12/23/2016. 12:08 AM.  History   Chief Complaint Chief Complaint  Patient presents with  . Shortness of Breath   The history is provided by the patient. No language interpreter was used.    HPI Comments: Michael [name] is a 76 y.o. male with PMHx including CHF, CAD with stent placement, CKD, HTN, HLD, PAF on Plavix, MI, and DM2 who presents to the Emergency Department via EMS complaining of intermittent, gradually worsening dyspnea beginning two days ago. No associated symptoms noted. He states his dyspnea is worse with lying flat and exertion and is improved with sitting upright. Patient states he is mildly SOB currently. He notes that he experienced similar dyspnea when he suffered his only MI. He uses Lasix 20 mg BID for CHF and has had no recent changes in this medication. He notes he has gained 2 pounds in the last few months but has been compliant with his Lasix. He has been eating and drinking normally. Patient also uses aspirin and several other medications daily and has been compliant with them. No recent changes in any of his regular medications. No h/o asthma or COPD. Patient denies chest pain, cough, fevers, chills, diarrhea, constipation, nausea, vomiting or any other associated symptoms. He is followed by two cardiologists.  Past Medical History:  Diagnosis Date  . Acute on chronic heart failure ([place])   . Allergy   . Arthritis   . AVM (arteriovenous malformation) of colon   . C. difficile enteritis   . CAD (coronary artery disease)    a. Cath 10/2014 - Mild to moderate diagonal, circumflex and obtuse marginal disease. Innominate artery sternosis.  . Carotid artery  disease ([place])    a. Carotid dopple 03/2014 03-50% RICA &  09-38% LICA stenosis b. 1/82  . CKD (chronic kidney disease), stage III   . Clostridium difficile colitis 05/13/2016  . Dyspnea   . Dysrhythmia    ATRIAL FIBRILATION  . Elevated troponin    a. 09/2014 in setting of AF RVR-->Myoview: EF 49%, no ischemia/infarct->Med Rx.  . GERD (gastroesophageal reflux disease)   . Gout   . H/O transfusion of whole blood   . Heart murmur   . History of PFTs    PFTs 6/16:  FVC 3.73 (87%), FEV1 2.93 (88%), FEV1/FVC 78%, DLCO 69%  . Hx of adenomatous colonic polyps 07/02/2016  . Hyperlipidemia   . Hypertension   . Hypothyroidism   . Iron deficiency anemia   . PAF (paroxysmal atrial fibrillation) ([place])    a. 09/2014: Converted on Dilt;  b. CHA2DS2VASc = 3-->eliquis;  c. 09/2014 Echo: EF 55-60%, mild LVH, mildly dil LA.  [name] Personal history of colonic polyps 2007, 2008   adenoma each time, largest 12 mm in 2007  . Psoriasis   . Staph infection 07/2016   left hand   . Staphylococcus aureus bacteremia 07/31/2016  . Type II diabetes mellitus ([place])    TYPE 2    Patient Active Problem List   Diagnosis Date Noted  . History of GI bleed 08/26/2016  . Septic phlebitis of upper extremity 08/26/2016  . Coronary artery disease involving native coronary artery of native heart without angina pectoris 07/31/2016  . PAD (peripheral artery disease) (  [place]) 07/31/2016  . Septic thrombophlebitis of upper extremity 07/31/2016  . History of Clostridium difficile colitis 07/31/2016  . Nonrheumatic aortic valve stenosis   . Chronic diastolic CHF (congestive heart failure) ([place]) 07/27/2016  . Hx of adenomatous colonic polyps 07/02/2016  . Shortness of breath 06/05/2016  . Hypothyroidism 03/17/2016  . Noncompliance with diet and medication regimen 12/16/2015  . Frequency-urgency syndrome 03/08/2015  . Subclavian artery stenosis, right ([place]) 01/16/2015  . Cholelithiasis 01/16/2015  . Restless leg syndrome 01/10/2015    . Postherpetic neuralgia 12/18/2014  . Persistent atrial fibrillation ([place])   . Hyperlipidemia   . Essential hypertension   . Psoriasis 03/19/2014  . Vitamin D deficiency 11/13/2013  . Carotid stenosis 11/06/2013  . Psoriatic arthritis ([place]) 11/06/2013  . Hypertriglyceridemia 05/16/2013  . Morbid obesity due to excess calories ([place]) 09/10/2012  . Gout 09/10/2012  . AVM (arteriovenous malformation) of small bowel, acquired with hemorrhage ([place]) 01/20/2012  . Iron deficiency anemia due to chronic blood loss 01/20/2012  . DM (diabetes mellitus), type 2 with peripheral vascular complications ([place]) 78/67/5449    Past Surgical History:  Procedure Laterality Date  . APPENDECTOMY  02/09/2014  . CARDIAC CATHETERIZATION N/A 07/29/2016   Procedure: Right/Left Heart Cath and Coronary Angiography;  Surgeon: Nelva Bush, MD;  Location: [facility] LAB;  Service: Cardiovascular;  Laterality: N/A;  . CARDIAC CATHETERIZATION N/A 07/29/2016   Procedure: Coronary Stent Intervention;  Surgeon: Nelva Bush, MD;  Location: [facility];  Service: Cardiovascular;  Laterality: N/A;  . COLONOSCOPY  02/10/2011   internal hemorrhoids  . COLONOSCOPY W/ POLYPECTOMY  04/09/2006   12 mm adenoma  . COLONOSCOPY W/ POLYPECTOMY  06/17/2007   5 mm adenoma  . COLONOSCOPY WITH PROPOFOL N/A 05/13/2016   Procedure: COLONOSCOPY WITH PROPOFOL;  Surgeon: Manus Gunning, MD;  Location: [facility] ENDOSCOPY;  Service: Gastroenterology;  Laterality: N/A;  . CORONARY STENT PLACEMENT  07/29/2016   OMI    DES  . ENTEROSCOPY N/A 08/14/2016   Procedure: ENTEROSCOPY;  Surgeon: Manus Gunning, MD;  Location: [facility] ENDOSCOPY;  Service: Gastroenterology;  Laterality: N/A;  . ESOPHAGOGASTRODUODENOSCOPY  12/23/2011   Procedure: ESOPHAGOGASTRODUODENOSCOPY (EGD);  Surgeon: Irene Shipper, MD;  Location: [facility];  Service: Endoscopy;  Laterality: N/A;  with small bowel bx's  . GIVENS CAPSULE STUDY  12/28/2011  . GIVENS  CAPSULE STUDY N/A 08/14/2016   Procedure: GIVENS CAPSULE STUDY;  Surgeon: Manus Gunning, MD;  Location: [place] [facility];  Service: Gastroenterology;  Laterality: N/A;  . KNEE ARTHROSCOPY Right   . LAPAROSCOPIC APPENDECTOMY N/A 02/09/2014   Procedure: APPENDECTOMY LAPAROSCOPIC;  Surgeon: Leighton Ruff, MD;  Location: [facility] ORS;  Service: General;  Laterality: N/A;  . LEFT HEART CATHETERIZATION WITH CORONARY ANGIOGRAM N/A 11/15/2014   Procedure: LEFT HEART CATHETERIZATION WITH CORONARY ANGIOGRAM;  Surgeon: Belva Crome, MD;  Location[facility] C[facility] LAB;  Service: Cardiovascular;  Laterality: N/A;  . ROTATOR CUFF REPAIR     left       Home Medications    Prior to Admission medications   Medication Sig Start Date End Date Taking? Authorizing Provider  acetaminophen (TYLENOL) 325 MG tablet Take 2 tablets (650 mg total) by mouth every 4 (four) hours as needed for mild pain, fever or headache. 08/04/16   Velvet Bathe, MD  allopurinol (ZYLOPRIM) 100 MG tablet Take 2 tablets (200 mg total) by mouth daily. 02/03/16   [name], Evie Lacks, MD  amiodarone (PACERONE) 200 MG tablet TAKE 1 TABLET BY MOUTH DAILY 06/24/16  Wellington Hampshire, MD  aspirin 81 MG chewable tablet Chew 1 tablet (81 mg total) by mouth daily. 08/05/16   Velvet Bathe, MD  atorvastatin (LIPITOR) 40 MG tablet Take 1 tablet (40 mg total) by mouth daily. 03/09/16   Josue Hector, MD  Blood Glucose Monitoring Suppl (ONE TOUCH ULTRA 2) W/DEVICE KIT Use as directed Dx E11.9 05/18/14   [name], Evie Lacks, MD  Blood Pressure Monitor KIT Use to check blood pressure daily Dx I10 10/04/15   [name], Evie Lacks, MD  CARTIA XT 180 MG 24 hr capsule take 1 capsule by mouth once daily 11/30/16   Wellington Hampshire, MD  Cholecalciferol 1000 UNITS TBDP Take 1,000-5,000 Units by mouth daily. Takes 1000 units everyday except for on Saturday patient takes 5000 units    [provider]  clopidogrel (PLAVIX) 75 MG tablet Take 1 tablet (75 mg total)  by mouth daily with breakfast. 10/16/16   Josue Hector, MD  fenofibrate 160 MG tablet TAKE 1 TABLET(160 MG) BY MOUTH DAILY 08/07/16   Wellington Hampshire, MD  ferrous sulfate 325 (65 FE) MG tablet Take 325 mg by mouth 3 (three) times daily with meals.    [provider]  furosemide (LASIX) 20 MG tablet Take 2 tablets (40 mg total) by mouth daily. 08/16/16   Rai, Ripudeep K, MD  glipiZIDE (GLUCOTROL) 10 MG tablet Take 1 tablet (10 mg total) by mouth 2 (two) times daily before a meal. 10/01/16   [name], Evie Lacks, MD  glucose blood (ONE TOUCH ULTRA TEST) test strip 1 each by Other route 4 (four) times daily. Use to check blood sugar four times a day  Dx: E11.9- insulin Dependant 04/06/16   [name], Evie Lacks, MD  hydrALAZINE (APRESOLINE) 25 MG tablet Take 1 tablet (25 mg total) by mouth 3 (three) times daily. Patient not taking: Reported on 11/02/2016 08/16/16   Rai, Vernelle Emerald, MD  HYDROcodone-acetaminophen (NORCO/VICODIN) 5-325 MG tablet Take 1 tablet by mouth 2 (two) times daily as needed for moderate pain. 08/28/16 08/28/17  [name], Evie Lacks, MD  Insulin Glargine (TOUJEO SOLOSTAR) 300 UNIT/ML SOPN Inject 52 Units into the skin daily.    [provider]  Insulin Pen Needle 31G X 5 MM MISC 1 Units by Does not apply route 4 (four) times daily. 03/17/16   [name], Evie Lacks, MD  Lancets [facility] ULTRASOFT) lancets Use to check blood sugars daily 05/18/14   [name], Evie Lacks, MD  levothyroxine (SYNTHROID, LEVOTHROID) 150 MCG tablet TAKE 1 TABLET BY MOUTH DAILY BEFORE BREAKFAST 03/16/16   [name], Evie Lacks, MD  Metoprolol Tartrate 75 MG TABS Take 75 mg by mouth 2 (two) times daily. 11/05/16   Josue Hector, MD  omeprazole (PRILOSEC) 40 MG capsule TAKE 1 CAPSULE BY MOUTH EVERY DAY 05/04/16   [name], Evie Lacks, MD  potassium chloride SA (K-DUR,KLOR-CON) 20 MEQ tablet Take 1 tablet (20 mEq total) by mouth daily. 08/16/16   Mendel Corning, MD    Family History Family History    Problem Relation Age of Onset  . Heart attack Father   . Lung cancer Father   . Diabetes Father   . Stroke Father   . Hypertension Father   . Stroke Mother   . Hypertension Mother   . Cancer Brother        liver  . Heart disease Brother        chf  . COPD Sister   . Malignant hyperthermia Neg Hx  Social History Social History  Substance Use Topics  . Smoking status: Former Smoker    Packs/day: 0.70    Years: 48.00    Types: Cigarettes    Quit date: 03/29/2006  . Smokeless tobacco: Never Used  . Alcohol use 0.6 oz/week    1 Cans of beer per week     Comment: occasional alcohol intake     Allergies   Percocet [oxycodone-acetaminophen]; Invokana [canagliflozin]; and Metformin and related   Review of Systems Review of Systems All other systems reviewed and are negative for acute change except as noted in the HPI. Physical Exam Updated Vital Signs BP (!) 167/68   Pulse (!) 59   Temp 97.7 F (36.5 C) (Oral)   Resp 19   SpO2 98%   Physical Exam  Constitutional: He is oriented to person, place, and time. He appears well-developed and well-nourished. No distress.  HENT:  Head: Normocephalic and atraumatic.  Mouth/Throat: Oropharynx is clear and moist. No oropharyngeal exudate.  Eyes: Conjunctivae and EOM are normal. Pupils are equal, round, and reactive to light.  Neck: Normal range of motion. Neck supple.  No meningismus.  Cardiovascular: Normal rate, regular rhythm and intact distal pulses.   Murmur heard. 3/6 systolic murmur.  Pulmonary/Chest: Effort normal. No respiratory distress.  No distress. Speaking in full sentences. Crackles at the bases.  Abdominal: Soft. There is no tenderness. There is no rebound and no guarding.  Musculoskeletal: Normal range of motion. He exhibits edema. He exhibits no tenderness.  Trace pedal edema bilaterally.  Neurological: He is alert and oriented to person, place, and time. No cranial nerve deficit. He exhibits normal  muscle tone. Coordination normal.  No ataxia on finger to nose bilaterally. No pronator drift. 5/5 strength throughout. CN 2-12 intact.Equal grip strength. Sensation intact.   Skin: Skin is warm.  Psychiatric: He has a normal mood and affect. His behavior is normal.  Nursing note and vitals reviewed.  ED Treatments / Results  Labs (all labs ordered are listed, but only abnormal results are displayed) Labs Reviewed  CBC WITH DIFFERENTIAL/PLATELET - Abnormal; Notable for the following:       Result Value   RBC 3.50 (*)    Hemoglobin 10.2 (*)    HCT 31.7 (*)    RDW 16.2 (*)    Platelets 139 (*)    Lymphs Abs 0.6 (*)    All other components within normal limits  BASIC METABOLIC PANEL - Abnormal; Notable for the following:    Glucose, Bld 248 (*)    BUN 21 (*)    Creatinine, Ser 1.86 (*)    Calcium 8.6 (*)    GFR calc non Af Amer 34 (*)    GFR calc Af Amer 39 (*)    All other components within normal limits  TROPONIN I - Abnormal; Notable for the following:    Troponin I 0.14 (*)    All other components within normal limits  BRAIN NATRIURETIC PEPTIDE - Abnormal; Notable for the following:    B Natriuretic Peptide 359.4 (*)    All other components within normal limits  TROPONIN I - Abnormal; Notable for the following:    Troponin I 0.18 (*)    All other components within normal limits  TROPONIN I - Abnormal; Notable for the following:    Troponin I 0.19 (*)    All other components within normal limits  GLUCOSE, CAPILLARY - Abnormal; Notable for the following:    Glucose-Capillary 134 (*)  All other components within normal limits  TROPONIN I    EKG  EKG Interpretation  Date/Time:  Tuesday Dec 22 2016 23:42:05 EDT Ventricular Rate:  58 PR Interval:    QRS Duration: 113 QT Interval:  486 QTC Calculation: 478 R Axis:   48 Text Interpretation:  Sinus rhythm Incomplete left bundle branch block Borderline prolonged QT interval Normal sinus rhythm Confirmed by Ezequiel Essex [phone]) on 12/23/2016 12:10:41 AM       Radiology Dg Chest 2 View  Result Date: 12/23/2016 CLINICAL DATA:  Shortness of breath when lying down. History of hypertension, atrial fibrillation, diabetes and heart failure. EXAM: CHEST  2 VIEW COMPARISON:  Chest radiograph Dec 08, 2016 FINDINGS: The cardiac silhouette is mildly enlarged and unchanged. Coronary artery stent. Calcified aortic knob. Similar pulmonary vascular congestion and mild interstitial prominence. Small residual LEFT pleural effusion. No pneumothorax. Symmetric vascular calcifications in the neck. IMPRESSION: Mild cardiomegaly and interstitial prominence most compatible with pulmonary edema. Small residual LEFT pleural effusion. Electronically Signed   By: Elon Alas M.D.   On: 12/23/2016 00:49    Procedures Procedures (including critical care time)  DIAGNOSTIC STUDIES: Oxygen Saturation is 98% on RA, normal by my interpretation.    COORDINATION OF CARE: 11:52 PM Discussed treatment plan with pt at bedside and pt agreed to plan.  Medications Ordered in ED Medications - No data to display   Initial Impression / Assessment and Plan / ED Course  I have reviewed the triage vital signs and the nursing notes.  Pertinent labs & imaging results that were available during my care of the patient were reviewed by me and considered in my medical decision making (see chart for details).    2 days of SOB, no chest pain.  Compliant with diuretics.   No distress, not hypoxic. EKG unchanged.  Edema on Xray.  IV lasix given.  Minimal troponin elevated.  Denies chest pain Did have NSTEMI in January.  D/w Dr. Kenton Kingfisher of cardiology who feels that patient can be admitted to medicine.   WOB improved after IV lasix. No chest pain. Admission d/w Dr. Alcario Drought.  Final Clinical Impressions(s) / ED Diagnoses   Final diagnoses:  Acute on chronic diastolic congestive heart failure (HCC)  Elevated troponin    New  Prescriptions New Prescriptions   No medications on file   I personally performed the services described in this documentation, which was scribed in my presence. The recorded information has been reviewed and is accurate.   Ezequiel Essex, MD 12/23/16 [phone]

## 2016-12-23 ENCOUNTER — Observation Stay (HOSPITAL_BASED_OUTPATIENT_CLINIC_OR_DEPARTMENT_OTHER): Payer: Medicare Other

## 2016-12-23 ENCOUNTER — Emergency Department (HOSPITAL_COMMUNITY): Payer: Medicare Other

## 2016-12-23 DIAGNOSIS — R072 Precordial pain: Secondary | ICD-10-CM

## 2016-12-23 DIAGNOSIS — Z794 Long term (current) use of insulin: Secondary | ICD-10-CM | POA: Diagnosis not present

## 2016-12-23 DIAGNOSIS — Z7982 Long term (current) use of aspirin: Secondary | ICD-10-CM | POA: Diagnosis not present

## 2016-12-23 DIAGNOSIS — E1122 Type 2 diabetes mellitus with diabetic chronic kidney disease: Secondary | ICD-10-CM | POA: Diagnosis present

## 2016-12-23 DIAGNOSIS — R0602 Shortness of breath: Secondary | ICD-10-CM | POA: Diagnosis not present

## 2016-12-23 DIAGNOSIS — I2510 Atherosclerotic heart disease of native coronary artery without angina pectoris: Secondary | ICD-10-CM | POA: Diagnosis not present

## 2016-12-23 DIAGNOSIS — I5033 Acute on chronic diastolic (congestive) heart failure: Secondary | ICD-10-CM | POA: Diagnosis not present

## 2016-12-23 DIAGNOSIS — I130 Hypertensive heart and chronic kidney disease with heart failure and stage 1 through stage 4 chronic kidney disease, or unspecified chronic kidney disease: Secondary | ICD-10-CM | POA: Diagnosis present

## 2016-12-23 DIAGNOSIS — Z955 Presence of coronary angioplasty implant and graft: Secondary | ICD-10-CM | POA: Diagnosis not present

## 2016-12-23 DIAGNOSIS — I480 Paroxysmal atrial fibrillation: Secondary | ICD-10-CM | POA: Diagnosis present

## 2016-12-23 DIAGNOSIS — R748 Abnormal levels of other serum enzymes: Secondary | ICD-10-CM

## 2016-12-23 DIAGNOSIS — N183 Chronic kidney disease, stage 3 (moderate): Secondary | ICD-10-CM | POA: Diagnosis present

## 2016-12-23 DIAGNOSIS — I481 Persistent atrial fibrillation: Secondary | ICD-10-CM | POA: Diagnosis not present

## 2016-12-23 DIAGNOSIS — E1151 Type 2 diabetes mellitus with diabetic peripheral angiopathy without gangrene: Secondary | ICD-10-CM | POA: Diagnosis not present

## 2016-12-23 DIAGNOSIS — I248 Other forms of acute ischemic heart disease: Secondary | ICD-10-CM | POA: Diagnosis present

## 2016-12-23 DIAGNOSIS — Z888 Allergy status to other drugs, medicaments and biological substances status: Secondary | ICD-10-CM | POA: Diagnosis not present

## 2016-12-23 DIAGNOSIS — I10 Essential (primary) hypertension: Secondary | ICD-10-CM

## 2016-12-23 DIAGNOSIS — I252 Old myocardial infarction: Secondary | ICD-10-CM | POA: Diagnosis not present

## 2016-12-23 DIAGNOSIS — Z79899 Other long term (current) drug therapy: Secondary | ICD-10-CM | POA: Diagnosis not present

## 2016-12-23 DIAGNOSIS — I350 Nonrheumatic aortic (valve) stenosis: Secondary | ICD-10-CM | POA: Diagnosis present

## 2016-12-23 LAB — ECHOCARDIOGRAM COMPLETE
AO mean calculated velocity dopler: 220 cm/s
AOASC: 37 cm
AOVTI: 83.4 cm
AV VEL mean LVOT/AV: 0.41
AV area mean vel ind: 0.55 cm2/m2
AV pk vel: 313 cm/s
AVAREAMEANV: 1.27 cm2
AVAREAVTI: 1.26 cm2
AVAREAVTIIND: 0.6 cm2/m2
AVG: 22 mmHg
AVPG: 39 mmHg
Ao pk vel: 0.4 m/s
CHL CUP AV PEAK INDEX: 0.54
CHL CUP AV VALUE AREA INDEX: 0.6
CHL CUP AV VEL: 1.4
EERAT: 18.71
EWDT: 359 ms
FS: 22 % — AB (ref 28–44)
HEIGHTINCHES: 71 in
IVS/LV PW RATIO, ED: 1.39
LA diam index: 1.94 cm/m2
LA vol A4C: 64.6 ml
LA vol index: 30.4 mL/m2
LA vol: 70.5 mL
LASIZE: 45 mm
LEFT ATRIUM END SYS DIAM: 45 mm
LV E/e'average: 18.71
LV PW d: 12.9 mm — AB (ref 0.6–1.1)
LV TDI E'MEDIAL: 4.9
LV e' LATERAL: 6.2 cm/s
LVEEMED: 18.71
LVOT SV: 117 mL
LVOT VTI: 37.2 cm
LVOT area: 3.14 cm2
LVOT diameter: 20 mm
LVOT peak VTI: 0.45 cm
LVOT peak grad rest: 6 mmHg
LVOT peak vel: 126 cm/s
Lateral S' vel: 13.4 cm/s
MV Dec: 359
MV pk A vel: 84.9 m/s
MV pk E vel: 116 m/s
MVAP: 2.1 cm2
MVPG: 5 mmHg
P 1/2 time: 105 ms
TAPSE: 23.8 mm
TDI e' lateral: 6.2
Valve area: 1.4 cm2
WEIGHTICAEL: 4003.2 [oz_av]

## 2016-12-23 LAB — CBC WITH DIFFERENTIAL/PLATELET
BASOS ABS: 0 10*3/uL (ref 0.0–0.1)
Basophils Relative: 0 %
EOS ABS: 0.1 10*3/uL (ref 0.0–0.7)
Eosinophils Relative: 2 %
HCT: 31.7 % — ABNORMAL LOW (ref 39.0–52.0)
HEMOGLOBIN: 10.2 g/dL — AB (ref 13.0–17.0)
LYMPHS ABS: 0.6 10*3/uL — AB (ref 0.7–4.0)
Lymphocytes Relative: 12 %
MCH: 29.1 pg (ref 26.0–34.0)
MCHC: 32.2 g/dL (ref 30.0–36.0)
MCV: 90.6 fL (ref 78.0–100.0)
Monocytes Absolute: 0.3 10*3/uL (ref 0.1–1.0)
Monocytes Relative: 7 %
NEUTROS PCT: 79 %
Neutro Abs: 3.9 10*3/uL (ref 1.7–7.7)
Platelets: 139 10*3/uL — ABNORMAL LOW (ref 150–400)
RBC: 3.5 MIL/uL — AB (ref 4.22–5.81)
RDW: 16.2 % — ABNORMAL HIGH (ref 11.5–15.5)
WBC: 4.9 10*3/uL (ref 4.0–10.5)

## 2016-12-23 LAB — GLUCOSE, CAPILLARY
Glucose-Capillary: 134 mg/dL — ABNORMAL HIGH (ref 65–99)
Glucose-Capillary: 147 mg/dL — ABNORMAL HIGH (ref 65–99)
Glucose-Capillary: 105 mg/dL — ABNORMAL HIGH (ref 65–99)
Glucose-Capillary: 127 mg/dL — ABNORMAL HIGH (ref 65–99)

## 2016-12-23 LAB — BASIC METABOLIC PANEL
ANION GAP: 6 (ref 5–15)
BUN: 21 mg/dL — AB (ref 6–20)
CHLORIDE: 104 mmol/L (ref 101–111)
CO2: 26 mmol/L (ref 22–32)
Calcium: 8.6 mg/dL — ABNORMAL LOW (ref 8.9–10.3)
Creatinine, Ser: 1.86 mg/dL — ABNORMAL HIGH (ref 0.61–1.24)
GFR calc non Af Amer: 34 mL/min — ABNORMAL LOW (ref >60)
GFR, EST AFRICAN AMERICAN: 39 mL/min — AB (ref >60)
Glucose, Bld: 248 mg/dL — ABNORMAL HIGH (ref 65–99)
POTASSIUM: 3.9 mmol/L (ref 3.5–5.1)
SODIUM: 136 mmol/L (ref 135–145)

## 2016-12-23 LAB — TROPONIN I
TROPONIN I: 0.14 ng/mL — AB (ref <0.03)
TROPONIN I: 0.18 ng/mL — AB (ref <0.03)
TROPONIN I: 0.18 ng/mL — AB (ref <0.03)
TROPONIN I: 0.19 ng/mL — AB (ref <0.03)

## 2016-12-23 LAB — BRAIN NATRIURETIC PEPTIDE: B NATRIURETIC PEPTIDE 5: 359.4 pg/mL — AB (ref 0.0–100.0)

## 2016-12-23 MED ORDER — VITAMIN D 1000 UNITS PO TABS
1000.0000 [IU] | ORAL_TABLET | ORAL | Status: DC
  Administered 2016-12-23: 1000 [IU] via ORAL
  Filled 2016-12-23: qty 1

## 2016-12-23 MED ORDER — FERROUS SULFATE 325 (65 FE) MG PO TABS
325.0000 mg | ORAL_TABLET | Freq: Three times a day (TID) | ORAL | Status: DC
  Administered 2016-12-23 – 2016-12-24 (×5): 325 mg via ORAL
  Filled 2016-12-23 (×5): qty 1

## 2016-12-23 MED ORDER — AMIODARONE HCL 200 MG PO TABS
200.0000 mg | ORAL_TABLET | Freq: Every day | ORAL | Status: DC
Start: 2016-12-23 — End: 2016-12-24
  Administered 2016-12-23 – 2016-12-24 (×2): 200 mg via ORAL
  Filled 2016-12-23 (×3): qty 1

## 2016-12-23 MED ORDER — ACETAMINOPHEN 325 MG PO TABS: 650.0000 mg | ORAL_TABLET | ORAL | Status: DC | PRN

## 2016-12-23 MED ORDER — FUROSEMIDE 10 MG/ML IJ SOLN
40.0000 mg | Freq: Two times a day (BID) | INTRAMUSCULAR | Status: DC
  Administered 2016-12-23 (×2): 40 mg via INTRAVENOUS
  Filled 2016-12-23 (×2): qty 4

## 2016-12-23 MED ORDER — ASPIRIN 81 MG PO CHEW
324.0000 mg | CHEWABLE_TABLET | Freq: Once | ORAL | Status: AC
  Administered 2016-12-23: 324 mg via ORAL
  Filled 2016-12-23: qty 4

## 2016-12-23 MED ORDER — INSULIN ASPART 100 UNIT/ML ~~LOC~~ SOLN
0.0000 [IU] | Freq: Three times a day (TID) | SUBCUTANEOUS | Status: DC
Start: 2016-12-23 — End: 2016-12-24
  Administered 2016-12-23 (×2): 2 [IU] via SUBCUTANEOUS
  Administered 2016-12-24: 3 [IU] via SUBCUTANEOUS
  Administered 2016-12-24: 8 [IU] via SUBCUTANEOUS

## 2016-12-23 MED ORDER — HYDRALAZINE HCL 25 MG PO TABS
25.0000 mg | ORAL_TABLET | Freq: Three times a day (TID) | ORAL | Status: DC
  Administered 2016-12-23 – 2016-12-24 (×4): 25 mg via ORAL
  Filled 2016-12-23 (×4): qty 1

## 2016-12-23 MED ORDER — PANTOPRAZOLE SODIUM 40 MG PO TBEC
80.0000 mg | DELAYED_RELEASE_TABLET | Freq: Every day | ORAL | Status: DC
  Administered 2016-12-23 – 2016-12-24 (×2): 80 mg via ORAL
  Filled 2016-12-23 (×3): qty 2

## 2016-12-23 MED ORDER — METOPROLOL TARTRATE 75 MG PO TABS: 75.0000 mg | ORAL_TABLET | Freq: Two times a day (BID) | ORAL | Status: DC

## 2016-12-23 MED ORDER — SODIUM CHLORIDE 0.9% FLUSH
3.0000 mL | INTRAVENOUS | Status: DC | PRN
  Administered 2016-12-23: 3 mL via INTRAVENOUS
  Filled 2016-12-23: qty 3

## 2016-12-23 MED ORDER — INSULIN GLARGINE 100 UNIT/ML ~~LOC~~ SOLN
35.0000 [IU] | Freq: Every day | SUBCUTANEOUS | Status: DC
  Administered 2016-12-23 – 2016-12-24 (×2): 35 [IU] via SUBCUTANEOUS
  Filled 2016-12-23 (×2): qty 0.35

## 2016-12-23 MED ORDER — ALLOPURINOL 100 MG PO TABS
200.0000 mg | ORAL_TABLET | Freq: Every day | ORAL | Status: DC
Start: 2016-12-23 — End: 2016-12-24
  Administered 2016-12-23 – 2016-12-24 (×2): 200 mg via ORAL
  Filled 2016-12-23 (×3): qty 2

## 2016-12-23 MED ORDER — ONDANSETRON HCL 4 MG/2ML IJ SOLN: 4.0000 mg | Freq: Four times a day (QID) | INTRAMUSCULAR | Status: DC | PRN

## 2016-12-23 MED ORDER — VITAMIN D 1000 UNITS PO TABS: 5000.0000 [IU] | ORAL_TABLET | ORAL | Status: DC

## 2016-12-23 MED ORDER — SODIUM CHLORIDE 0.9 % IV SOLN: 250.0000 mL | INTRAVENOUS | Status: DC | PRN

## 2016-12-23 MED ORDER — METOPROLOL TARTRATE 50 MG PO TABS
75.0000 mg | ORAL_TABLET | Freq: Two times a day (BID) | ORAL | Status: DC
  Administered 2016-12-23 – 2016-12-24 (×2): 75 mg via ORAL
  Filled 2016-12-23 (×5): qty 1

## 2016-12-23 MED ORDER — CLOPIDOGREL BISULFATE 75 MG PO TABS
75.0000 mg | ORAL_TABLET | Freq: Every day | ORAL | Status: DC
  Administered 2016-12-23 – 2016-12-24 (×2): 75 mg via ORAL
  Filled 2016-12-23 (×2): qty 1

## 2016-12-23 MED ORDER — LEVOTHYROXINE SODIUM 75 MCG PO TABS
150.0000 ug | ORAL_TABLET | Freq: Every day | ORAL | Status: DC
  Administered 2016-12-23 – 2016-12-24 (×2): 150 ug via ORAL
  Filled 2016-12-23 (×2): qty 2
  Filled 2016-12-23: qty 1

## 2016-12-23 MED ORDER — ASPIRIN 81 MG PO CHEW
81.0000 mg | CHEWABLE_TABLET | Freq: Every day | ORAL | Status: DC
  Administered 2016-12-23 – 2016-12-24 (×2): 81 mg via ORAL
  Filled 2016-12-23 (×3): qty 1

## 2016-12-23 MED ORDER — FUROSEMIDE 10 MG/ML IJ SOLN
40.0000 mg | Freq: Once | INTRAMUSCULAR | Status: AC
  Administered 2016-12-23: 40 mg via INTRAVENOUS
  Filled 2016-12-23: qty 4

## 2016-12-23 MED ORDER — ENOXAPARIN SODIUM 40 MG/0.4ML ~~LOC~~ SOLN
40.0000 mg | SUBCUTANEOUS | Status: DC
  Administered 2016-12-23: 40 mg via SUBCUTANEOUS
  Filled 2016-12-23 (×4): qty 0.4

## 2016-12-23 MED ORDER — DILTIAZEM HCL ER COATED BEADS 180 MG PO CP24
180.0000 mg | ORAL_CAPSULE | Freq: Every day | ORAL | Status: DC
  Administered 2016-12-23 – 2016-12-24 (×2): 180 mg via ORAL
  Filled 2016-12-23 (×3): qty 1

## 2016-12-23 MED ORDER — SODIUM CHLORIDE 0.9% FLUSH
3.0000 mL | Freq: Two times a day (BID) | INTRAVENOUS | Status: DC
  Administered 2016-12-23 – 2016-12-24 (×3): 3 mL via INTRAVENOUS

## 2016-12-23 MED ORDER — ATORVASTATIN CALCIUM 40 MG PO TABS
40.0000 mg | ORAL_TABLET | Freq: Every day | ORAL | Status: DC
  Administered 2016-12-23: 40 mg via ORAL
  Filled 2016-12-23: qty 1

## 2016-12-23 MED ORDER — FENOFIBRATE 160 MG PO TABS
160.0000 mg | ORAL_TABLET | Freq: Every day | ORAL | Status: DC
  Administered 2016-12-23 – 2016-12-24 (×2): 160 mg via ORAL
  Filled 2016-12-23 (×3): qty 1

## 2016-12-23 NOTE — H&P (Signed)
History and Physical    Michael [name] MWN:[id number] DOB: 1940/11/02 DOA: 12/22/2016  PCP: Cassandria Anger, MD  Patient coming from: Home  I have personally briefly reviewed patient's old medical records in [place]  Chief Complaint: SOB  HPI: Michael [name] is a 76 y.o. male with medical history significant of CAD with NSTEMI in Jan with stenting, second admission in late Jan for CHF.  Jan was also complicated by GIB as well.  Patient also has h/o diastolic CHF, CKD, HTN, PAF on Asa/plavix, DM2.  Patient presents to the ED with c/o SOB, chest tightness, orthopnea.  Symptoms better sitting up right.  Symptoms onset a few days ago, worse over the past 2 days particularly.  Had BLE swelling that was worse 2 days ago but is actually slightly better today.  Weight only up 2 lbs since Jan.   ED Course: Trop 0.14, BNP 359.4.  EKG unchanged from priors.  CXR shows pulm edema, Given 40 IV lasix.   Review of Systems: As per HPI otherwise 10 point review of systems negative.   Past Medical History:  Diagnosis Date  . Acute on chronic heart failure ([place])   . Allergy   . Arthritis   . AVM (arteriovenous malformation) of colon   . C. difficile enteritis   . CAD (coronary artery disease)    a. Cath 10/2014 - Mild to moderate diagonal, circumflex and obtuse marginal disease. Innominate artery sternosis.  . Carotid artery disease ([place])    a. Carotid dopple 03/2014 40-34% RICA &  74-25% LICA stenosis b. 9/56  . CKD (chronic kidney disease), stage III   . Clostridium difficile colitis 05/13/2016  . Dyspnea   . Dysrhythmia    ATRIAL FIBRILATION  . Elevated troponin    a. 09/2014 in setting of AF RVR-->Myoview: EF 49%, no ischemia/infarct->Med Rx.  . GERD (gastroesophageal reflux disease)   . Gout   . H/O transfusion of whole blood   . Heart murmur   . History of PFTs    PFTs 6/16:  FVC 3.73 (87%), FEV1 2.93 (88%), FEV1/FVC 78%, DLCO 69%  . Hx of adenomatous colonic polyps  07/02/2016  . Hyperlipidemia   . Hypertension   . Hypothyroidism   . Iron deficiency anemia   . PAF (paroxysmal atrial fibrillation) ([place])    a. 09/2014: Converted on Dilt;  b. CHA2DS2VASc = 3-->eliquis;  c. 09/2014 Echo: EF 55-60%, mild LVH, mildly dil LA.  [name] Personal history of colonic polyps 2007, 2008   adenoma each time, largest 12 mm in 2007  . Psoriasis   . Staph infection 07/2016   left hand   . Staphylococcus aureus bacteremia 07/31/2016  . Type II diabetes mellitus ([place])    TYPE 2    Past Surgical History:  Procedure Laterality Date  . APPENDECTOMY  02/09/2014  . CARDIAC CATHETERIZATION N/A 07/29/2016   Procedure: Right/Left Heart Cath and Coronary Angiography;  Surgeon: Nelva Bush, MD;  Location: [facility];  Service: Cardiovascular;  Laterality: N/A;  . CARDIAC CATHETERIZATION N/A 07/29/2016   Procedure: Coronary Stent Intervention;  Surgeon: Nelva Bush, MD;  Location: [facility];  Service: Cardiovascular;  Laterality: N/A;  . COLONOSCOPY  02/10/2011   internal hemorrhoids  . COLONOSCOPY W/ POLYPECTOMY  04/09/2006   12 mm adenoma  . COLONOSCOPY W/ POLYPECTOMY  06/17/2007   5 mm adenoma  . COLONOSCOPY WITH PROPOFOL N/A 05/13/2016   Procedure: COLONOSCOPY WITH PROPOFOL;  Surgeon: Renelda Loma Armbruster,  MD;  Location: [facility];  Service: Gastroenterology;  Laterality: N/A;  . CORONARY STENT PLACEMENT  07/29/2016   OMI    DES  . ENTEROSCOPY N/A 08/14/2016   Procedure: ENTEROSCOPY;  Surgeon: Manus Gunning, MD;  Location: [facility] ENDOSCOPY;  Service: Gastroenterology;  Laterality: N/A;  . ESOPHAGOGASTRODUODENOSCOPY  12/23/2011   Procedure: ESOPHAGOGASTRODUODENOSCOPY (EGD);  Surgeon: Irene Shipper, MD;  Location: [facility];  Service: Endoscopy;  Laterality: N/A;  with small bowel bx's  . GIVENS CAPSULE STUDY  12/28/2011  . GIVENS CAPSULE STUDY N/A 08/14/2016   Procedure: GIVENS CAPSULE STUDY;  Surgeon: Manus Gunning, MD;  Location: [facility];  Service: Gastroenterology;  Laterality: N/A;  . KNEE ARTHROSCOPY Right   . LAPAROSCOPIC APPENDECTOMY N/A 02/09/2014   Procedure: APPENDECTOMY LAPAROSCOPIC;  Surgeon: Leighton Ruff, MD;  Location: [facility];  Service: General;  Laterality: N/A;  . LEFT HEART CATHETERIZATION WITH CORONARY ANGIOGRAM N/A 11/15/2014   Procedure: LEFT HEART CATHETERIZATION WITH CORONARY ANGIOGRAM;  Surgeon: Belva Crome, MD;  Location: [facility] C[facility] LAB;  Service: Cardiovascular;  Laterality: N/A;  . ROTATOR CUFF REPAIR     left     reports that he quit smoking about 10 years ago. His smoking use included Cigarettes. He has a 33.60 pack-year smoking history. He has never used smokeless tobacco. He reports that he drinks about 0.6 oz of alcohol per week . He reports that he does not use drugs.  Allergies  Allergen Reactions  . Percocet [Oxycodone-Acetaminophen] Nausea And Vomiting  . Invokana [Canagliflozin]     Side effects  . Metformin And Related     Upset stomach    Family History  Problem Relation Age of Onset  . Heart attack Father   . Lung cancer Father   . Diabetes Father   . Stroke Father   . Hypertension Father   . Stroke Mother   . Hypertension Mother   . Cancer Brother        liver  . Heart disease Brother        chf  . COPD Sister   . Malignant hyperthermia Neg Hx      Prior to Admission medications   Medication Sig Start Date End Date Taking? Authorizing Provider  acetaminophen (TYLENOL) 325 MG tablet Take 2 tablets (650 mg total) by mouth every 4 (four) hours as needed for mild pain, fever or headache. 08/04/16  Yes Velvet Bathe, MD  allopurinol (ZYLOPRIM) 100 MG tablet Take 2 tablets (200 mg total) by mouth daily. 02/03/16  Yes [name], Evie Lacks, MD  amiodarone (PACERONE) 200 MG tablet TAKE 1 TABLET BY MOUTH DAILY 06/24/16  Yes Wellington Hampshire, MD  aspirin 81 MG chewable tablet Chew 1 tablet (81 mg total) by mouth daily. 08/05/16  Yes Velvet Bathe, MD  atorvastatin (LIPITOR)  40 MG tablet Take 1 tablet (40 mg total) by mouth daily. 03/09/16  Yes Josue Hector, MD  Blood Glucose Monitoring Suppl (ONE TOUCH ULTRA 2) W/DEVICE KIT Use as directed Dx E11.9 05/18/14  Yes [name], Evie Lacks, MD  Blood Pressure Monitor KIT Use to check blood pressure daily Dx I10 10/04/15  Yes [name], Evie Lacks, MD  CARTIA XT 180 MG 24 hr capsule take 1 capsule by mouth once daily 11/30/16  Yes Wellington Hampshire, MD  Cholecalciferol 1000 UNITS TBDP Take 1,000-5,000 Units by mouth daily. Takes 1000 units everyday except for on Saturday patient takes 5000 units   Yes [provider]  clopidogrel (PLAVIX) 75  MG tablet Take 1 tablet (75 mg total) by mouth daily with breakfast. 10/16/16  Yes Josue Hector, MD  fenofibrate 160 MG tablet TAKE 1 TABLET(160 MG) BY MOUTH DAILY 08/07/16  Yes Wellington Hampshire, MD  ferrous sulfate 325 (65 FE) MG tablet Take 325 mg by mouth 3 (three) times daily with meals.   Yes [provider]  furosemide (LASIX) 20 MG tablet Take 2 tablets (40 mg total) by mouth daily. 08/16/16  Yes Rai, Ripudeep K, MD  glipiZIDE (GLUCOTROL) 10 MG tablet Take 1 tablet (10 mg total) by mouth 2 (two) times daily before a meal. 10/01/16  Yes [name], Evie Lacks, MD  glucose blood (ONE TOUCH ULTRA TEST) test strip 1 each by Other route 4 (four) times daily. Use to check blood sugar four times a day  Dx: E11.9- insulin Dependant 04/06/16  Yes [name], Evie Lacks, MD  hydrALAZINE (APRESOLINE) 25 MG tablet Take 1 tablet (25 mg total) by mouth 3 (three) times daily. 08/16/16  Yes Rai, Ripudeep K, MD  HYDROcodone-acetaminophen (NORCO/VICODIN) 5-325 MG tablet Take 1 tablet by mouth 2 (two) times daily as needed for moderate pain. 08/28/16 08/28/17 Yes [name], Evie Lacks, MD  Insulin Glargine (TOUJEO SOLOSTAR) 300 UNIT/ML SOPN Inject 52 Units into the skin daily.   Yes [provider]  Insulin Pen Needle 31G X 5 MM MISC 1 Units by Does not apply route 4 (four) times daily.  03/17/16  Yes [name], Evie Lacks, MD  Lancets [facility] ULTRASOFT) lancets Use to check blood sugars daily 05/18/14  Yes [name], Evie Lacks, MD  levothyroxine (SYNTHROID, LEVOTHROID) 150 MCG tablet TAKE 1 TABLET BY MOUTH DAILY BEFORE BREAKFAST 03/16/16  Yes [name], Evie Lacks, MD  Metoprolol Tartrate 75 MG TABS Take 75 mg by mouth 2 (two) times daily. 11/05/16  Yes Josue Hector, MD  omeprazole (PRILOSEC) 40 MG capsule TAKE 1 CAPSULE BY MOUTH EVERY DAY 05/04/16  Yes [name], Evie Lacks, MD  potassium chloride SA (K-DUR,KLOR-CON) 20 MEQ tablet Take 1 tablet (20 mEq total) by mouth daily. 08/16/16  Yes Rai, Vernelle Emerald, MD    Physical Exam: Vitals:   12/22/16 2345 12/23/16 0130 12/23/16 0200 12/23/16 0215  BP: (!) 167/68 (!) 159/58 (!) 156/56 (!) 149/58  Pulse: (!) 59 (!) 46 (!) 50 (!) 49  Resp: 19 14 13 12   Temp: 97.7 F (36.5 C)     TempSrc: Oral     SpO2: 98% 97% 98% 96%    Constitutional: NAD, calm, comfortable Eyes: PERRL, lids and conjunctivae normal ENMT: Mucous membranes are moist. Posterior pharynx clear of any exudate or lesions.Normal dentition.  Neck: normal, supple, no masses, no thyromegaly Respiratory: clear to auscultation bilaterally, no wheezing, no crackles. Normal respiratory effort. No accessory muscle use.  Cardiovascular: IRR, irr, 3/6 systolic murmur. Trace BLE edema. 2+ pedal pulses. No carotid bruits.  Abdomen: no tenderness, no masses palpated. No hepatosplenomegaly. Bowel sounds positive.  Musculoskeletal: no clubbing / cyanosis. No joint deformity upper and lower extremities. Good ROM, no contractures. Normal muscle tone.  Skin: no rashes, lesions, ulcers. No induration Neurologic: CN 2-12 grossly intact. Sensation intact, DTR normal. Strength 5/5 in all 4.  Psychiatric: Normal judgment and insight. Alert and oriented x 3. Normal mood.    Labs on Admission: I have personally reviewed following labs and imaging studies  CBC:  Recent Labs Lab  12/22/16 2352  WBC 4.9  NEUTROABS 3.9  HGB 10.2*  HCT 31.7*  MCV 90.6  PLT 139*  Basic Metabolic Panel:  Recent Labs Lab 12/22/16 2352  NA 136  K 3.9  CL 104  CO2 26  GLUCOSE 248*  BUN 21*  CREATININE 1.86*  CALCIUM 8.6*   GFR: CrCl cannot be calculated (Unknown ideal weight.). Liver Function Tests: No results for input(s): AST, ALT, ALKPHOS, BILITOT, PROT, ALBUMIN in the last 168 hours. No results for input(s): LIPASE, AMYLASE in the last 168 hours. No results for input(s): AMMONIA in the last 168 hours. Coagulation Profile: No results for input(s): INR, PROTIME in the last 168 hours. Cardiac Enzymes:  Recent Labs Lab 12/22/16 2352  TROPONINI 0.14*   BNP (last 3 results) No results for input(s): PROBNP in the last 8760 hours. HbA1C: No results for input(s): HGBA1C in the last 72 hours. CBG: No results for input(s): GLUCAP in the last 168 hours. Lipid Profile: No results for input(s): CHOL, HDL, LDLCALC, TRIG, CHOLHDL, LDLDIRECT in the last 72 hours. Thyroid Function Tests: No results for input(s): TSH, T4TOTAL, FREET4, T3FREE, THYROIDAB in the last 72 hours. Anemia Panel: No results for input(s): VITAMINB12, FOLATE, FERRITIN, TIBC, IRON, RETICCTPCT in the last 72 hours. Urine analysis:    Component Value Date/Time   COLORURINE YELLOW 05/11/2016 1600   APPEARANCEUR CLEAR 05/11/2016 1600   LABSPEC 1.021 05/11/2016 1600   PHURINE 5.5 05/11/2016 1600   GLUCOSEU NEGATIVE 05/11/2016 1600   GLUCOSEU NEGATIVE 11/07/2013 0754   HGBUR NEGATIVE 05/11/2016 1600   BILIRUBINUR NEGATIVE 05/11/2016 1600   BILIRUBINUR neg 03/08/2015 1105   KETONESUR NEGATIVE 05/11/2016 1600   PROTEINUR NEGATIVE 05/11/2016 1600   UROBILINOGEN negative 03/08/2015 1105   UROBILINOGEN 0.2 02/09/2014 1805   NITRITE NEGATIVE 05/11/2016 1600   LEUKOCYTESUR NEGATIVE 05/11/2016 1600    Radiological Exams on Admission: Dg Chest 2 View  Result Date: 12/23/2016 CLINICAL DATA:  Shortness  of breath when lying down. History of hypertension, atrial fibrillation, diabetes and heart failure. EXAM: CHEST  2 VIEW COMPARISON:  Chest radiograph Dec 08, 2016 FINDINGS: The cardiac silhouette is mildly enlarged and unchanged. Coronary artery stent. Calcified aortic knob. Similar pulmonary vascular congestion and mild interstitial prominence. Small residual LEFT pleural effusion. No pneumothorax. Symmetric vascular calcifications in the neck. IMPRESSION: Mild cardiomegaly and interstitial prominence most compatible with pulmonary edema. Small residual LEFT pleural effusion. Electronically Signed   By: Elon Alas M.D.   On: 12/23/2016 00:49    EKG: Independently reviewed.  Assessment/Plan Principal Problem:   Acute on chronic diastolic CHF (congestive heart failure) (HCC) Active Problems:   DM (diabetes mellitus), type 2 with peripheral vascular complications (HCC)   Persistent atrial fibrillation (HCC)   Essential hypertension   Coronary artery disease involving native coronary artery of native heart without angina pectoris    1. Acute on chronic CHF - 1. CHF pathway 2. 2d echo ordered 3. Lasix 3m IV BID 4. Daily BMP 5. Monitor intake and output 6. Tele monitor 2. HTN - continue home BP meds 3. DM2 - 1. Lantus 35 2. Mod scale SSI AC 4. A.Fib - 1. Continue ASA and plavix 5. CAD - 1. Continue ASA and plavix 2. Serial trops given initial trop of 0.14, if elevates significantly then start NTG / heparin gtt and call cards.  DVT prophylaxis: Lovenox Code Status: Full Family Communication: Wife at bedside Disposition Plan: Home after admit Consults called: None [place] [facility] Pager [phone] If 7AM-7PM, please contact day team taking care of patient [email or web address] Password [email or web address]  12/23/2016, 2:43 AM

## 2016-12-23 NOTE — Progress Notes (Signed)
  Echocardiogram 2D Echocardiogram has been performed.  Bridgette Wolden G Darran Gabay 12/23/2016, 1:03 PM

## 2016-12-23 NOTE — Progress Notes (Signed)
Spoke with patients wife and verbalized that she doesn't want [name] to give the Lovenox shots to the patient. Per  wife they were told by the MD not to take the shots anymore because it thins his blood  And was concern that the patient will bleed again. It was explain the importance of this medication but the wife still refused. Patient was just just quiet and was just  Listening at this time.

## 2016-12-23 NOTE — Care Management Obs Status (Signed)
[facility] [place] NOTIFICATION   Patient Details  Name: Michael [name] MRN: [id number] Date of Birth: Aug 02, 1940   Medicare Observation Status Notification Given:  Yes    Erenest Rasher, RN 12/23/2016, 1:04 PM

## 2016-12-23 NOTE — Progress Notes (Signed)
Patient seen and examined at bedside, patient admitted after midnight, please see earlier detailed admission note by Etta Quill, DO. Briefly, patient presented with CHF exacerbation. He has improved somewhat with IV diuresis. Echocardiogram is pending. Continue diuresis. Troponin is slightly elevated in the setting of CHF exacerbation and CKD; flat trend and asymptomatic.   Cordelia Poche, MD [facility] 12/23/2016, 1:24 PM Pager: [phone]

## 2016-12-23 NOTE — Care Management Note (Signed)
Case Management Note  Patient Details  Name: Michael [name] MRN: [id number] Date of Birth: April 27, 1941  Subjective/Objective:  Admitted with chf                 Action/Plan: Patient lives at home with his spouse; PCP: [name], MD; has private insurance with Medicare/ secondary with [employer];  CM will continue to follow for DCP  Expected Discharge Date:  Possibly 12/28/2016              Expected Discharge Plan:  [place]  In-House Referral:   [facility]  Discharge planning Services  CM Consult  Status of Service:  In process, will continue to follow  Sherrilyn Rist [phone] 12/23/2016, 10:07 AM

## 2016-12-23 NOTE — ED Notes (Signed)
Patient transported to X-ray 

## 2016-12-24 ENCOUNTER — Inpatient Hospital Stay (HOSPITAL_COMMUNITY): Admission: RE | Admit: 2016-12-24 | Payer: Medicare Other | Source: Ambulatory Visit

## 2016-12-24 DIAGNOSIS — I481 Persistent atrial fibrillation: Secondary | ICD-10-CM

## 2016-12-24 DIAGNOSIS — I2510 Atherosclerotic heart disease of native coronary artery without angina pectoris: Secondary | ICD-10-CM

## 2016-12-24 LAB — BASIC METABOLIC PANEL
ANION GAP: 8 (ref 5–15)
ANION GAP: 8 (ref 5–15)
BUN: 25 mg/dL — ABNORMAL HIGH (ref 6–20)
BUN: 25 mg/dL — ABNORMAL HIGH (ref 6–20)
CALCIUM: 8.5 mg/dL — AB (ref 8.9–10.3)
CO2: 29 mmol/L (ref 22–32)
CO2: 26 mmol/L (ref 22–32)
Calcium: 8.7 mg/dL — ABNORMAL LOW (ref 8.9–10.3)
Chloride: 101 mmol/L (ref 101–111)
Chloride: 103 mmol/L (ref 101–111)
Creatinine, Ser: 1.91 mg/dL — ABNORMAL HIGH (ref 0.61–1.24)
Creatinine, Ser: 1.88 mg/dL — ABNORMAL HIGH (ref 0.61–1.24)
GFR calc Af Amer: 38 mL/min — ABNORMAL LOW (ref >60)
GFR calc Af Amer: 39 mL/min — ABNORMAL LOW (ref >60)
GFR calc non Af Amer: 33 mL/min — ABNORMAL LOW (ref >60)
GFR, EST NON AFRICAN AMERICAN: 34 mL/min — AB (ref >60)
GLUCOSE: 130 mg/dL — AB (ref 65–99)
Glucose, Bld: 166 mg/dL — ABNORMAL HIGH (ref 65–99)
POTASSIUM: 3.7 mmol/L (ref 3.5–5.1)
POTASSIUM: 3.8 mmol/L (ref 3.5–5.1)
SODIUM: 137 mmol/L (ref 135–145)
Sodium: 138 mmol/L (ref 135–145)

## 2016-12-24 LAB — GLUCOSE, CAPILLARY
Glucose-Capillary: 151 mg/dL — ABNORMAL HIGH (ref 65–99)
Glucose-Capillary: 252 mg/dL — ABNORMAL HIGH (ref 65–99)
Glucose-Capillary: 129 mg/dL — ABNORMAL HIGH (ref 65–99)

## 2016-12-24 MED ORDER — FUROSEMIDE 40 MG PO TABS
40.0000 mg | ORAL_TABLET | Freq: Every day | ORAL | Status: DC
Start: 2016-12-24 — End: 2016-12-24
  Administered 2016-12-24: 40 mg via ORAL
  Filled 2016-12-24 (×2): qty 1

## 2016-12-24 NOTE — Discharge Instructions (Signed)
Heart Failure °Heart failure is a condition in which the heart has trouble pumping blood because it has become weak or stiff. This means that the heart does not pump blood efficiently for the body to work well. For some people with heart failure, fluid may back up into the lungs and there may be swelling (edema) in the lower legs. Heart failure is usually a long-term (chronic) condition. It is important for you to take good care of yourself and follow the treatment plan from your health care provider. °What are the causes? °This condition is caused by some health problems, including: °· High blood pressure (hypertension). Hypertension causes the heart muscle to work harder than normal. High blood pressure eventually causes the heart to become stiff and weak. °· Coronary artery disease (CAD). CAD is the buildup of cholesterol and fat (plaques) in the arteries of the heart. °· Heart attack (myocardial infarction). Injured tissue, which is caused by the heart attack, does not contract as well and the heart's ability to pump blood is weakened. °· Abnormal heart valves. When the heart valves do not open and close properly, the heart muscle must pump harder to keep the blood flowing. °· Heart muscle disease (cardiomyopathy or myocarditis). Heart muscle disease is damage to the heart muscle from a variety of causes, such as drug or alcohol abuse, infections, or unknown causes. These can increase the risk of heart failure. °· Lung disease. When the lungs do not work properly, the heart must work harder. ° °What increases the risk? °Risk of heart failure increases as a person ages. This condition is also more likely to develop in people who: °· Are overweight. °· Are male. °· Smoke or chew tobacco. °· Abuse alcohol or illegal drugs. °· Have taken medicines that can damage the heart, such as chemotherapy drugs. °· Have diabetes. °? High blood sugar (glucose) is associated with high fat (lipid) levels in the blood. °? Diabetes  can also damage tiny blood vessels that carry nutrients to the heart muscle. °· Have abnormal heart rhythms. °· Have thyroid problems. °· Have low blood counts (anemia). ° °What are the signs or symptoms? °Symptoms of this condition include: °· Shortness of breath with activity, such as when climbing stairs. °· Persistent cough. °· Swelling of the feet, ankles, legs, or abdomen. °· Unexplained weight gain. °· Difficulty breathing when lying flat (orthopnea). °· Waking from sleep because of the need to sit up and get more air. °· Rapid heartbeat. °· Fatigue and loss of energy. °· Feeling light-headed, dizzy, or close to fainting. °· Loss of appetite. °· Nausea. °· Increased urination during the night (nocturia). °· Confusion. ° °How is this diagnosed? °This condition is diagnosed based on: °· Medical history, symptoms, and a physical exam. °· Diagnostic tests, which may include: °? Echocardiogram. °? Electrocardiogram (ECG). °? Chest X-ray. °? Blood tests. °? Exercise stress test. °? Radionuclide scans. °? Cardiac catheterization and angiogram. ° °How is this treated? °Treatment for this condition is aimed at managing the symptoms of heart failure. Medicines, behavioral changes, or other treatments may be necessary to treat heart failure. °Medicines °These may include: °· Angiotensin-converting enzyme (ACE) inhibitors. This type of medicine blocks the effects of a blood protein called angiotensin-converting enzyme. ACE inhibitors relax (dilate) the blood vessels and help to lower blood pressure. °· Angiotensin receptor blockers (ARBs). This type of medicine blocks the actions of a blood protein called angiotensin. ARBs dilate the blood vessels and help to lower blood pressure. °· Water   pills (diuretics). Diuretics cause the kidneys to remove salt and water from the blood. The extra fluid is removed through urination, leaving a lower volume of blood that the heart has to pump. °· Beta blockers. These improve heart  muscle strength and they prevent the heart from beating too quickly. °· Digoxin. This increases the force of the heartbeat. ° °Healthy behavior changes °These may include: °· Reaching and maintaining a healthy weight. °· Stopping smoking or chewing tobacco. °· Eating heart-healthy foods. °· Limiting or avoiding alcohol. °· Stopping use of street drugs (illegal drugs). °· Physical activity. ° °Other treatments °These may include: °· Surgery to open blocked coronary arteries or repair damaged heart valves. °· Placement of a biventricular pacemaker to improve heart muscle function (cardiac resynchronization therapy). This device paces both the right ventricle and left ventricle. °· Placement of a device to treat serious abnormal heart rhythms (implantable cardioverter defibrillator, or ICD). °· Placement of a device to improve the pumping ability of the heart (left ventricular assist device, or LVAD). °· Heart transplant. This can cure heart failure, and it is considered for certain patients who do not improve with other therapies. ° °Follow these instructions at home: °Medicines °· Take over-the-counter and prescription medicines only as told by your health care provider. Medicines are important in reducing the workload of your heart, slowing the progression of heart failure, and improving your symptoms. °? Do not stop taking your medicine unless your health care provider told you to do that. °? Do not skip any dose of medicine. °? Refill your prescriptions before you run out of medicine. You need your medicines every day. °Eating and drinking ° °· Eat heart-healthy foods. Talk with a dietitian to make an eating plan that is right for you. °? Choose foods that contain no trans fat and are low in saturated fat and cholesterol. Healthy choices include fresh or frozen fruits and vegetables, fish, lean meats, legumes, fat-free or low-fat dairy products, and whole-grain or high-fiber foods. °? Limit salt (sodium) if  directed by your health care provider. Sodium restriction may reduce symptoms of heart failure. Ask a dietitian to recommend heart-healthy seasonings. °? Use healthy cooking methods instead of frying. Healthy methods include roasting, grilling, broiling, baking, poaching, steaming, and stir-frying. °· Limit your fluid intake if directed by your health care provider. Fluid restriction may reduce symptoms of heart failure. °Lifestyle °· Stop smoking or using chewing tobacco. Nicotine and tobacco can damage your heart and your blood vessels. Do not use nicotine gum or patches before talking to your health care provider. °· Limit alcohol intake to no more than 1 drink per day for non-pregnant women and 2 drinks per day for men. One drink equals 12 oz of beer, 5 oz of wine, or 1½ oz of hard liquor. °? Drinking more than that is harmful to your heart. Tell your health care provider if you drink alcohol several times a week. °? Talk with your health care provider about whether any level of alcohol use is safe for you. °? If your heart has already been damaged by alcohol or you have severe heart failure, drinking alcohol should be stopped completely. °· Stop use of illegal drugs. °· Lose weight if directed by your health care provider. Weight loss may reduce symptoms of heart failure. °· Do moderate physical activity if directed by your health care provider. People who are elderly and people with severe heart failure should consult with a health care provider for physical activity recommendations. °  Monitor important information °· Weigh yourself every day. Keeping track of your weight daily helps you to notice excess fluid sooner. °? Weigh yourself every morning after you urinate and before you eat breakfast. °? Wear the same amount of clothing each time you weigh yourself. °? Record your daily weight. Provide your health care provider with your weight record. °· Monitor and record your blood pressure as told by your health  care provider. °· Check your pulse as told by your health care provider. °Dealing with extreme temperatures °· If the weather is extremely hot: °? Avoid vigorous physical activity. °? Use air conditioning or fans or seek a cooler location. °? Avoid caffeine and alcohol. °? Wear loose-fitting, lightweight, and light-colored clothing. °· If the weather is extremely cold: °? Avoid vigorous physical activity. °? Layer your clothes. °? Wear mittens or gloves, a hat, and a scarf when you go outside. °? Avoid alcohol. °General instructions °· Manage other health conditions such as hypertension, diabetes, thyroid disease, or abnormal heart rhythms as told by your health care provider. °· Learn to manage stress. If you need help to do this, ask your health care provider. °· Plan rest periods when fatigued. °· Get ongoing education and support as needed. °· Participate in or seek rehabilitation as needed to maintain or improve independence and quality of life. °· Stay up to date with immunizations. Keeping current on pneumococcal and influenza immunizations is especially important to prevent respiratory infections. °· Keep all follow-up visits as told by your health care provider. This is important. °Contact a health care provider if: °· You have a rapid weight gain. °· You have increasing shortness of breath that is unusual for you. °· You are unable to participate in your usual physical activities. °· You tire easily. °· You cough more than normal, especially with physical activity. °· You have any swelling or more swelling in areas such as your hands, feet, ankles, or abdomen. °· You are unable to sleep because it is hard to breathe. °· You feel like your heart is beating quickly (palpitations). °· You become dizzy or light-headed when you stand up. °Get help right away if: °· You have difficulty breathing. °· You notice or your family notices a change in your awareness, such as having trouble staying awake or having  difficulty with concentration. °· You have pain or discomfort in your chest. °· You have an episode of fainting (syncope). °This information is not intended to replace advice given to you by your health care provider. Make sure you discuss any questions you have with your health care provider. °Document Released: 07/13/2005 Document Revised: 03/17/2016 Document Reviewed: 02/05/2016 °Elsevier Interactive Patient Education © 2017 [employer] ° °

## 2016-12-24 NOTE — Progress Notes (Signed)
Discharge instructions reviewed with patient and spouse, questions answered, iv removed Neta Mends RN 5:59 PM 12-24-2016

## 2016-12-24 NOTE — Consult Note (Signed)
   [facility] Inpatient Consult   12/24/2016  Michael [name] 09-24-1940 [id number]  Patient was assessed for [facility] for community services for the Medicare [facility]. Patient was previously active with [facility] for HF EMMI calls.  Met with patient at bedside regarding being restarted with [facility] services.   Patient states he feels that he has no community care management needs at this time.  Patient agreeable to HF EMMI follow up calls. Of note, [facility] services does not replace or interfere with any services that are arranged by inpatient case management or social work. For additional questions or referrals please contact:  Natividad Brood, RN BSN [facility] Liaison  [phone] business mobile phone Toll free office [phone]

## 2016-12-24 NOTE — Discharge Summary (Signed)
Physician Discharge Summary  MAYER VONDRAK HBZ:[id number] DOB: [birth year] DOA: 12/22/2016  PCP: Cassandria Anger, MD  Admit date: 12/22/2016 Discharge date: 12/24/2016  Admitted From: Home Disposition: Home  Recommendations for Outpatient Follow-up:  1. Follow up with PCP in 1 week 2. Follow up with cardiology in 1-2 weeks 3. Please obtain BMP/CBC in one week   Discharge Condition: Stable CODE STATUS: Full code Diet recommendation: Heart healthy   Brief/Interim Summary:  Admission HPI written by Etta Quill, DO   Chief Complaint: SOB  HPI: Michael [name] is a 76 y.o. male with medical history significant of CAD with NSTEMI in Jan with stenting, second admission in late Jan for CHF.  Jan was also complicated by GIB as well.  Patient also has h/o diastolic CHF, CKD, HTN, PAF on Asa/plavix, DM2.  Patient presents to the ED with c/o SOB, chest tightness, orthopnea.  Symptoms better sitting up right.  Symptoms onset a few days ago, worse over the past 2 days particularly.  Had BLE swelling that was worse 2 days ago but is actually slightly better today.  Weight only up 2 lbs since Jan.   ED Course: Trop 0.14, BNP 359.4.  EKG unchanged from priors.  CXR shows pulm edema, Given 40 IV lasix.    Hospital course:  Acute on chronic diastolic heart failure EF off 60-65% with grade 2 diastolic dysfunction. Unknown what put him into an exacerbation. He was treated with IV lasix, which improved respiratory status. He was transitioned to home regimen prior to discharge.  Acute kidney injury on CKD III Baseline creatinine of about 1.5. 1.86 on admission and slightly worsened two days later with aggressive diuresis. Improvement once transitioned to home diuresis regimen. Will need a repeat BMP to check progression.  Essential hypertension Slightly hypertensive. Continued   Diabetes mellitus Continued Lantus at reduced dose while inpatient. Sliding scale while inpatient. Resume  home regimen on discharge. Could consider discontinuing glipizide in setting of concomitant insulin use.  CAD No chest pain. Continued Plavix. Troponin mildly elevated but likely related to demand ischemia in setting of acute heart failure. No evidence of ACS on EKG or clinically. Troponin trend remained flat.  Moderate aortic stenosis Seen on echocardiogram. Does not appear symptomatic. Cardiology follow-up.  Discharge Diagnoses:  Principal Problem:   Acute on chronic diastolic CHF (congestive heart failure) (HCC) Active Problems:   DM (diabetes mellitus), type 2 with peripheral vascular complications (HCC)   Persistent atrial fibrillation (HCC)   Essential hypertension   Coronary artery disease involving native coronary artery of native heart without angina pectoris    Discharge Instructions  Discharge Instructions    Call MD for:  difficulty breathing, headache or visual disturbances    Complete by:  As directed    Call MD for:  extreme fatigue    Complete by:  As directed    Call MD for:  persistant dizziness or light-headedness    Complete by:  As directed    Diet - low sodium heart healthy    Complete by:  As directed    Increase activity slowly    Complete by:  As directed      Allergies as of 12/24/2016      Reactions   Percocet [oxycodone-acetaminophen] Nausea And Vomiting   Invokana [canagliflozin]    Side effects   Metformin And Related    Upset stomach      Medication List    STOP taking these medications   acetaminophen 325  MG tablet Commonly known as:  TYLENOL     TAKE these medications   allopurinol 100 MG tablet Commonly known as:  ZYLOPRIM Take 2 tablets (200 mg total) by mouth daily.   amiodarone 200 MG tablet Commonly known as:  PACERONE TAKE 1 TABLET BY MOUTH DAILY   aspirin 81 MG chewable tablet Chew 1 tablet (81 mg total) by mouth daily.   atorvastatin 40 MG tablet Commonly known as:  LIPITOR Take 1 tablet (40 mg total) by mouth  daily.   Blood Pressure Monitor Kit Use to check blood pressure daily Dx I10   CARTIA XT 180 MG 24 hr capsule Generic drug:  diltiazem take 1 capsule by mouth once daily   Cholecalciferol 1000 units Tbdp Take 1,000-5,000 Units by mouth daily. Takes 1000 units everyday except for on Saturday patient takes 5000 units   clopidogrel 75 MG tablet Commonly known as:  PLAVIX Take 1 tablet (75 mg total) by mouth daily with breakfast.   fenofibrate 160 MG tablet TAKE 1 TABLET(160 MG) BY MOUTH DAILY   ferrous sulfate 325 (65 FE) MG tablet Take 325 mg by mouth 3 (three) times daily with meals.   furosemide 20 MG tablet Commonly known as:  LASIX Take 2 tablets (40 mg total) by mouth daily.   glipiZIDE 10 MG tablet Commonly known as:  GLUCOTROL Take 1 tablet (10 mg total) by mouth 2 (two) times daily before a meal.   glucose blood test strip Commonly known as:  ONE TOUCH ULTRA TEST 1 each by Other route 4 (four) times daily. Use to check blood sugar four times a day  Dx: E11.9- insulin Dependant   hydrALAZINE 25 MG tablet Commonly known as:  APRESOLINE Take 1 tablet (25 mg total) by mouth 3 (three) times daily.   HYDROcodone-acetaminophen 5-325 MG tablet Commonly known as:  NORCO/VICODIN Take 1 tablet by mouth 2 (two) times daily as needed for moderate pain.   Insulin Pen Needle 31G X 5 MM Misc 1 Units by Does not apply route 4 (four) times daily.   levothyroxine 150 MCG tablet Commonly known as:  SYNTHROID, LEVOTHROID TAKE 1 TABLET BY MOUTH DAILY BEFORE BREAKFAST   Metoprolol Tartrate 75 MG Tabs Take 75 mg by mouth 2 (two) times daily.   omeprazole 40 MG capsule Commonly known as:  PRILOSEC TAKE 1 CAPSULE BY MOUTH EVERY DAY   ONE TOUCH ULTRA 2 w/Device Kit Use as directed Dx E11.9   onetouch ultrasoft lancets Use to check blood sugars daily   potassium chloride SA 20 MEQ tablet Commonly known as:  K-DUR,KLOR-CON Take 1 tablet (20 mEq total) by mouth daily.    TOUJEO SOLOSTAR 300 UNIT/ML Sopn Generic drug:  Insulin Glargine Inject 52 Units into the skin daily.      Follow-up Information    [name], Evie Lacks, MD. Schedule an appointment as soon as possible for a visit in 1 week(s).   Specialty:  Internal Medicine Contact information: [place] [address] [phone]        Josue Hector, MD. Schedule an appointment as soon as possible for a visit in 1 week(s).   Specialty:  Cardiology Why:  Heart failure. Aortic stenosis Contact information: [address] [place] [address] [phone]          Allergies  Allergen Reactions  . Percocet [Oxycodone-Acetaminophen] Nausea And Vomiting  . Invokana [Canagliflozin]     Side effects  . Metformin And Related  Upset stomach    Consultations:  None   Procedures/Studies: Dg Chest 2 View  Result Date: 12/23/2016 CLINICAL DATA:  Shortness of breath when lying down. History of hypertension, atrial fibrillation, diabetes and heart failure. EXAM: CHEST  2 VIEW COMPARISON:  Chest radiograph Dec 08, 2016 FINDINGS: The cardiac silhouette is mildly enlarged and unchanged. Coronary artery stent. Calcified aortic knob. Similar pulmonary vascular congestion and mild interstitial prominence. Small residual LEFT pleural effusion. No pneumothorax. Symmetric vascular calcifications in the neck. IMPRESSION: Mild cardiomegaly and interstitial prominence most compatible with pulmonary edema. Small residual LEFT pleural effusion. Electronically Signed   By: Elon Alas M.D.   On: 12/23/2016 00:49   Dg Chest 2 View  Result Date: 12/08/2016 CLINICAL DATA:  Palpitations and dyspnea EXAM: CHEST  2 VIEW COMPARISON:  None. FINDINGS: Stable cardiomegaly with aortic atherosclerosis. Right-sided PICC line has been removed. Mild interstitial edema is again noted without pneumonic consolidation. Minimal scarring is noted at the left lung base and adjacent to the  left heart border. Slight blunting of left costophrenic angle appears chronic and may be related to minimal pleural thickening or trace left effusion. No acute nor suspicious osseous abnormalities. IMPRESSION: Stable cardiomegaly with aortic atherosclerosis. Mild interstitial prominence consistent with interstitial edema is noted with minimal blunting the left costophrenic angle either representing tiny left effusion versus pleural thickening. Electronically Signed   By: Ashley Royalty M.D.   On: 12/08/2016 02:01     Echocardiogram (12/23/2016)  Study Conclusions  - Left ventricle: The cavity size was normal. There was moderate   concentric hypertrophy. Systolic function was normal. The   estimated ejection fraction was in the range of 60% to 65%. Wall   motion was normal; there were no regional wall motion   abnormalities. Features are consistent with a pseudonormal left   ventricular filling pattern, with concomitant abnormal relaxation   and increased filling pressure (grade 2 diastolic dysfunction). - Aortic valve: Valve mobility was restricted. There was moderate   stenosis. Valve area (VTI): 1.71 cm^2. Valve area (Vmax): 1.44   cm^2. Valve area (Vmean): 1.77 cm^2. - Mitral valve: Mildly calcified annulus. There was mild   regurgitation directed centrally. Valve area by pressure   half-time: 2.1 cm^2. - Left atrium: The atrium was moderately dilated.   Subjective: Patient reports no chest pain or dyspnea.  Discharge Exam: Vitals:   12/24/16 0533 12/24/16 1224  BP: (!) 149/65 (!) 159/57  Pulse: 60 (!) 55  Resp: 18 18  Temp: 98.2 F (36.8 C) 97.7 F (36.5 C)   Vitals:   12/23/16 2200 12/24/16 0015 12/24/16 0533 12/24/16 1224  BP: (!) 155/60 (!) 154/61 (!) 149/65 (!) 159/57  Pulse: (!) 59 60 60 (!) 55  Resp: _0 Temp: 98 F (36.7 C) 98 F (36.7 C) 98.2 F (36.8 C) 97.7 F (36.5 C)  TempSrc: Oral Oral Oral Oral  SpO2: 95% 96% 95% 96%  Weight:   111.3 kg (245 lb  4.8 oz)   Height:        General: Pt is alert, awake, not in acute distress Cardiovascular: RRR, S1/S2 +, no rubs, no gallops Respiratory: CTA bilaterally, no wheezing, no rhonchi Abdominal: Soft, NT, ND, bowel sounds + Extremities: no edema, no cyanosis    The results of significant diagnostics from this hospitalization (including imaging, microbiology, ancillary and laboratory) are listed below for reference.     Labs: BNP (last 3 results)  Recent Labs  07/27/16 0957 08/10/16 1941  12/22/16 2353  BNP 64.5 468.1* 224.4*   Basic Metabolic Panel:  Recent Labs Lab 12/22/16 2352 12/24/16 0319 12/24/16 1427  NA 136 138 137  K 3.9 3.7 3.8  CL 104 101 103  CO2 _0 GLUCOSE 248* 130* 166*  BUN 21* 25* 25*  CREATININE 1.86* 1.91* 1.88*  CALCIUM 8.6* 8.7* 8.5*   CBC:  Recent Labs Lab 12/22/16 2352  WBC 4.9  NEUTROABS 3.9  HGB 10.2*  HCT 31.7*  MCV 90.6  PLT 139*   Cardiac Enzymes:  Recent Labs Lab 12/22/16 2352 12/23/16 0243 12/23/16 0720 12/23/16 1428  TROPONINI 0.14* 0.18* 0.19* 0.18*   CBG:  Recent Labs Lab 12/23/16 1252 12/23/16 1625 12/23/16 2154 12/24/16 0740 12/24/16 1128  GLUCAP 147* 105* 127* 151* 252*   Urinalysis    Component Value Date/Time   COLORURINE YELLOW 05/11/2016 1600   APPEARANCEUR CLEAR 05/11/2016 1600   LABSPEC 1.021 05/11/2016 1600   PHURINE 5.5 05/11/2016 1600   GLUCOSEU NEGATIVE 05/11/2016 1600   GLUCOSEU NEGATIVE 11/07/2013 0754   HGBUR NEGATIVE 05/11/2016 1600   BILIRUBINUR NEGATIVE 05/11/2016 1600   BILIRUBINUR neg 03/08/2015 1105   KETONESUR NEGATIVE 05/11/2016 1600   PROTEINUR NEGATIVE 05/11/2016 1600   UROBILINOGEN negative 03/08/2015 1105   UROBILINOGEN 0.2 02/09/2014 1805   NITRITE NEGATIVE 05/11/2016 1600   LEUKOCYTESUR NEGATIVE 05/11/2016 1600    Time coordinating discharge: Over 30 minutes  SIGNED:   Cordelia Poche, MD [facility] 12/24/2016, 3:48 PM Pager [phone]  If  7PM-7AM, please contact night-coverage [email or web address] Password [email or web address]

## 2016-12-27 ENCOUNTER — Encounter (HOSPITAL_COMMUNITY): Payer: Self-pay | Admitting: Emergency Medicine

## 2016-12-27 ENCOUNTER — Emergency Department (HOSPITAL_COMMUNITY)
Admission: EM | Admit: 2016-12-27 | Discharge: 2016-12-27 | Disposition: A | Discharge status: Home / Self Care | Payer: Medicare Other | Attending: Emergency Medicine | Admitting: Emergency Medicine

## 2016-12-27 ENCOUNTER — Emergency Department (HOSPITAL_COMMUNITY): Payer: Medicare Other

## 2016-12-27 DIAGNOSIS — I129 Hypertensive chronic kidney disease with stage 1 through stage 4 chronic kidney disease, or unspecified chronic kidney disease: Secondary | ICD-10-CM | POA: Diagnosis not present

## 2016-12-27 DIAGNOSIS — Y929 Unspecified place or not applicable: Secondary | ICD-10-CM | POA: Diagnosis not present

## 2016-12-27 DIAGNOSIS — W312XXA Contact with powered woodworking and forming machines, initial encounter: Secondary | ICD-10-CM | POA: Insufficient documentation

## 2016-12-27 DIAGNOSIS — Z7982 Long term (current) use of aspirin: Secondary | ICD-10-CM | POA: Insufficient documentation

## 2016-12-27 DIAGNOSIS — I2510 Atherosclerotic heart disease of native coronary artery without angina pectoris: Secondary | ICD-10-CM | POA: Insufficient documentation

## 2016-12-27 DIAGNOSIS — Z7984 Long term (current) use of oral hypoglycemic drugs: Secondary | ICD-10-CM | POA: Diagnosis not present

## 2016-12-27 DIAGNOSIS — S61022A Laceration with foreign body of left thumb without damage to nail, initial encounter: Secondary | ICD-10-CM | POA: Insufficient documentation

## 2016-12-27 DIAGNOSIS — S61002A Unspecified open wound of left thumb without damage to nail, initial encounter: Secondary | ICD-10-CM | POA: Diagnosis not present

## 2016-12-27 DIAGNOSIS — N183 Chronic kidney disease, stage 3 (moderate): Secondary | ICD-10-CM | POA: Insufficient documentation

## 2016-12-27 DIAGNOSIS — Z87891 Personal history of nicotine dependence: Secondary | ICD-10-CM | POA: Insufficient documentation

## 2016-12-27 DIAGNOSIS — Z7902 Long term (current) use of antithrombotics/antiplatelets: Secondary | ICD-10-CM | POA: Insufficient documentation

## 2016-12-27 DIAGNOSIS — I5032 Chronic diastolic (congestive) heart failure: Secondary | ICD-10-CM | POA: Diagnosis not present

## 2016-12-27 DIAGNOSIS — S61012A Laceration without foreign body of left thumb without damage to nail, initial encounter: Secondary | ICD-10-CM | POA: Diagnosis not present

## 2016-12-27 DIAGNOSIS — S60411A Abrasion of left index finger, initial encounter: Secondary | ICD-10-CM | POA: Diagnosis present

## 2016-12-27 DIAGNOSIS — Y999 Unspecified external cause status: Secondary | ICD-10-CM | POA: Diagnosis not present

## 2016-12-27 DIAGNOSIS — Y9389 Activity, other specified: Secondary | ICD-10-CM | POA: Insufficient documentation

## 2016-12-27 NOTE — ED Notes (Signed)
Patient transported to X-ray 

## 2016-12-27 NOTE — Discharge Instructions (Signed)
Keep the area clean and dry, cover with a bandage for the next few days. Follow-up with your primary care provider for reevaluation and wound check. Return to the ED if any concerning signs or symptoms of infection develop.

## 2016-12-27 NOTE — ED Triage Notes (Signed)
Pt with lac to L thumb with table saw. Pt denies pain. Bleeding controlled upon arrival to ED, gauze applied in triage.

## 2016-12-27 NOTE — ED Provider Notes (Signed)
[facility] DEPT Provider Note   CSN: [id number] Arrival date & time: 12/27/16  1338  By signing my name below, I, Reola Mosher, attest that this documentation has been prepared under the direction and in the presence of [facility]. Electronically Signed: Reola Mosher, ED Scribe. 12/27/16. 2:09 PM.  History   Chief Complaint Chief Complaint  Patient presents with  . Laceration   The history is provided by the patient and medical records. No language interpreter was used.    Michael [name] is a 76 y.o. male with a PMHx significant of DM, CKD stage III, CHF, AVM, obesity, who presents to the Emergency Department complaining of a wound sustained to the distal left first digit that occurred approximately one hour ago. Bleeding is controlled with pressure dressing. Per pt, he was working with a table saw this afternoon when he got too close to the blade, causing it to come across the distal left first digit. He denies pain to the area. Pt applied a pressure dressing to the area and cleaned the area w/ peroxide and he notes that bleeding was minimal, but now is controlled. No noted noted treatments for his wound were tried prior to coming into the ED. Per prior chart review, pt was recently seen and admitted from 05/29-05/31 for an acute CHF exacerbation. Since being d/c'd he states that he has otherwise been feeling at his baseline and there have been no recent complaints. He denies numbness, tingling, weakness, shortness of breath, chest pain, abdominal pain, or any other associated symptoms. Tetanus is UTD.   Past Medical History:  Diagnosis Date  . Acute on chronic heart failure ([place])   . Allergy   . Arthritis   . AVM (arteriovenous malformation) of colon   . C. difficile enteritis   . CAD (coronary artery disease)    a. Cath 10/2014 - Mild to moderate diagonal, circumflex and obtuse marginal disease. Innominate artery sternosis.  . Carotid artery disease ([place])    a.  Carotid dopple 03/2014 75-44% RICA &  92-01% LICA stenosis b. 0/07  . CKD (chronic kidney disease), stage III   . Clostridium difficile colitis 05/13/2016  . Dyspnea   . Dysrhythmia    ATRIAL FIBRILATION  . Elevated troponin    a. 09/2014 in setting of AF RVR-->Myoview: EF 49%, no ischemia/infarct->Med Rx.  . GERD (gastroesophageal reflux disease)   . Gout   . H/O transfusion of whole blood   . Heart murmur   . History of PFTs    PFTs 6/16:  FVC 3.73 (87%), FEV1 2.93 (88%), FEV1/FVC 78%, DLCO 69%  . Hx of adenomatous colonic polyps 07/02/2016  . Hyperlipidemia   . Hypertension   . Hypothyroidism   . Iron deficiency anemia   . PAF (paroxysmal atrial fibrillation) ([place])    a. 09/2014: Converted on Dilt;  b. CHA2DS2VASc = 3-->eliquis;  c. 09/2014 Echo: EF 55-60%, mild LVH, mildly dil LA.  [name] Personal history of colonic polyps 2007, 2008   adenoma each time, largest 12 mm in 2007  . Psoriasis   . Staph infection 07/2016   left hand   . Staphylococcus aureus bacteremia 07/31/2016  . Type II diabetes mellitus ([place])    TYPE 2   Patient Active Problem List   Diagnosis Date Noted  . History of GI bleed 08/26/2016  . Septic phlebitis of upper extremity 08/26/2016  . Acute on chronic diastolic CHF (congestive heart failure) ([place]) 08/10/2016  . Coronary artery disease involving  native coronary artery of native heart without angina pectoris 07/31/2016  . PAD (peripheral artery disease) ([place]) 07/31/2016  . Septic thrombophlebitis of upper extremity 07/31/2016  . History of Clostridium difficile colitis 07/31/2016  . Nonrheumatic aortic valve stenosis   . Chronic diastolic CHF (congestive heart failure) ([place]) 07/27/2016  . Hx of adenomatous colonic polyps 07/02/2016  . Shortness of breath 06/05/2016  . Hypothyroidism 03/17/2016  . Noncompliance with diet and medication regimen 12/16/2015  . Frequency-urgency syndrome 03/08/2015  . Subclavian artery stenosis, right ([place]) 01/16/2015  .  Cholelithiasis 01/16/2015  . Restless leg syndrome 01/10/2015  . Postherpetic neuralgia 12/18/2014  . Persistent atrial fibrillation ([place])   . Hyperlipidemia   . Essential hypertension   . Psoriasis 03/19/2014  . Vitamin D deficiency 11/13/2013  . Carotid stenosis 11/06/2013  . Psoriatic arthritis ([place]) 11/06/2013  . Hypertriglyceridemia 05/16/2013  . Morbid obesity due to excess calories ([place]) 09/10/2012  . Gout 09/10/2012  . AVM (arteriovenous malformation) of small bowel, acquired with hemorrhage ([place]) 01/20/2012  . Iron deficiency anemia due to chronic blood loss 01/20/2012  . DM (diabetes mellitus), type 2 with peripheral vascular complications ([place]) 33/54/5625   Past Surgical History:  Procedure Laterality Date  . APPENDECTOMY  02/09/2014  . CARDIAC CATHETERIZATION N/A 07/29/2016   Procedure: Right/Left Heart Cath and Coronary Angiography;  Surgeon: Nelva Bush, MD;  Location: [facility];  Service: Cardiovascular;  Laterality: N/A;  . CARDIAC CATHETERIZATION N/A 07/29/2016   Procedure: Coronary Stent Intervention;  Surgeon: Nelva Bush, MD;  Location: [facility];  Service: Cardiovascular;  Laterality: N/A;  . COLONOSCOPY  02/10/2011   internal hemorrhoids  . COLONOSCOPY W/ POLYPECTOMY  04/09/2006   12 mm adenoma  . COLONOSCOPY W/ POLYPECTOMY  06/17/2007   5 mm adenoma  . COLONOSCOPY WITH PROPOFOL N/A 05/13/2016   Procedure: COLONOSCOPY WITH PROPOFOL;  Surgeon: Manus Gunning, MD;  Location: [facility] ENDOSCOPY;  Service: Gastroenterology;  Laterality: N/A;  . CORONARY STENT PLACEMENT  07/29/2016   OMI    DES  . ENTEROSCOPY N/A 08/14/2016   Procedure: ENTEROSCOPY;  Surgeon: Manus Gunning, MD;  Location: [facility] ENDOSCOPY;  Service: Gastroenterology;  Laterality: N/A;  . ESOPHAGOGASTRODUODENOSCOPY  12/23/2011   Procedure: ESOPHAGOGASTRODUODENOSCOPY (EGD);  Surgeon: Irene Shipper, MD;  Location: [facility];  Service: Endoscopy;  Laterality: N/A;  with  small bowel bx's  . GIVENS CAPSULE STUDY  12/28/2011  . GIVENS CAPSULE STUDY N/A 08/14/2016   Procedure: GIVENS CAPSULE STUDY;  Surgeon: Manus Gunning, MD;  Location: [facility];  Service: Gastroenterology;  Laterality: N/A;  . KNEE ARTHROSCOPY Right   . LAPAROSCOPIC APPENDECTOMY N/A 02/09/2014   Procedure: APPENDECTOMY LAPAROSCOPIC;  Surgeon: Leighton Ruff, MD;  Location: [facility] ORS;  Service: General;  Laterality: N/A;  . LEFT HEART CATHETERIZATION WITH CORONARY ANGIOGRAM N/A 11/15/2014   Procedure: LEFT HEART CATHETERIZATION WITH CORONARY ANGIOGRAM;  Surgeon: Belva Crome, MD;  Location: [facility] CATH LAB;  Service: Cardiovascular;  Laterality: N/A;  . ROTATOR CUFF REPAIR     left    Home Medications    Prior to Admission medications   Medication Sig Start Date End Date Taking? Authorizing Provider  allopurinol (ZYLOPRIM) 100 MG tablet Take 2 tablets (200 mg total) by mouth daily. 02/03/16   [name], Evie Lacks, MD  amiodarone (PACERONE) 200 MG tablet TAKE 1 TABLET BY MOUTH DAILY 06/24/16   Wellington Hampshire, MD  aspirin 81 MG chewable tablet Chew 1 tablet (81 mg total) by mouth daily.  08/05/16   Velvet Bathe, MD  atorvastatin (LIPITOR) 40 MG tablet Take 1 tablet (40 mg total) by mouth daily. 03/09/16   Josue Hector, MD  Blood Glucose Monitoring Suppl (ONE TOUCH ULTRA 2) W/DEVICE KIT Use as directed Dx E11.9 05/18/14   [name], Evie Lacks, MD  Blood Pressure Monitor KIT Use to check blood pressure daily Dx I10 10/04/15   [name], Evie Lacks, MD  CARTIA XT 180 MG 24 hr capsule take 1 capsule by mouth once daily 11/30/16   Wellington Hampshire, MD  Cholecalciferol 1000 UNITS TBDP Take 1,000-5,000 Units by mouth daily. Takes 1000 units everyday except for on Saturday patient takes 5000 units    [provider]  clopidogrel (PLAVIX) 75 MG tablet Take 1 tablet (75 mg total) by mouth daily with breakfast. 10/16/16   Josue Hector, MD  fenofibrate 160 MG tablet TAKE 1 TABLET(160 MG) BY  MOUTH DAILY 08/07/16   Wellington Hampshire, MD  ferrous sulfate 325 (65 FE) MG tablet Take 325 mg by mouth 3 (three) times daily with meals.    [provider]  furosemide (LASIX) 20 MG tablet Take 2 tablets (40 mg total) by mouth daily. 08/16/16   Rai, Ripudeep K, MD  glipiZIDE (GLUCOTROL) 10 MG tablet Take 1 tablet (10 mg total) by mouth 2 (two) times daily before a meal. 10/01/16   [name], Evie Lacks, MD  glucose blood (ONE TOUCH ULTRA TEST) test strip 1 each by Other route 4 (four) times daily. Use to check blood sugar four times a day  Dx: E11.9- insulin Dependant 04/06/16   [name], Evie Lacks, MD  hydrALAZINE (APRESOLINE) 25 MG tablet Take 1 tablet (25 mg total) by mouth 3 (three) times daily. 08/16/16   Rai, Vernelle Emerald, MD  HYDROcodone-acetaminophen (NORCO/VICODIN) 5-325 MG tablet Take 1 tablet by mouth 2 (two) times daily as needed for moderate pain. 08/28/16 08/28/17  [name], Evie Lacks, MD  Insulin Glargine (TOUJEO SOLOSTAR) 300 UNIT/ML SOPN Inject 52 Units into the skin daily.    [provider]  Insulin Pen Needle 31G X 5 MM MISC 1 Units by Does not apply route 4 (four) times daily. 03/17/16   [name], Evie Lacks, MD  Lancets [facility] ULTRASOFT) lancets Use to check blood sugars daily 05/18/14   [name], Evie Lacks, MD  levothyroxine (SYNTHROID, LEVOTHROID) 150 MCG tablet TAKE 1 TABLET BY MOUTH DAILY BEFORE BREAKFAST 03/16/16   [name], Evie Lacks, MD  Metoprolol Tartrate 75 MG TABS Take 75 mg by mouth 2 (two) times daily. 11/05/16   Josue Hector, MD  omeprazole (PRILOSEC) 40 MG capsule TAKE 1 CAPSULE BY MOUTH EVERY DAY 05/04/16   [name], Evie Lacks, MD  potassium chloride SA (K-DUR,KLOR-CON) 20 MEQ tablet Take 1 tablet (20 mEq total) by mouth daily. 08/16/16   Mendel Corning, MD   Family History Family History  Problem Relation Age of Onset  . Heart attack Father   . Lung cancer Father   . Diabetes Father   . Stroke Father   . Hypertension Father   . Stroke  Mother   . Hypertension Mother   . Cancer Brother        liver  . Heart disease Brother        chf  . COPD Sister   . Malignant hyperthermia Neg Hx    Social History Social History  Substance Use Topics  . Smoking status: Former Smoker    Packs/day: 0.70    Years: 48.00  Types: Cigarettes    Quit date: 03/29/2006  . Smokeless tobacco: Never Used  . Alcohol use 0.6 oz/week    1 Cans of beer per week     Comment: occasional alcohol intake   Allergies   Percocet [oxycodone-acetaminophen]; Invokana [canagliflozin]; and Metformin and related  Review of Systems Review of Systems  Respiratory: Negative for shortness of breath.   Cardiovascular: Negative for chest pain.  Gastrointestinal: Negative for abdominal pain.  Skin: Positive for wound.  Neurological: Negative for weakness and numbness.  All other systems reviewed and are negative.  Physical Exam Updated Vital Signs BP (!) 158/59 (BP Location: Left Arm)   Pulse (!) 59   Temp 97.6 F (36.4 C) (Oral)   Resp 18   SpO2 98%   Physical Exam  Constitutional: He appears well-developed and well-nourished. No distress.  HENT:  Head: Normocephalic and atraumatic.  Eyes: Conjunctivae are normal. Right eye exhibits no discharge. Left eye exhibits no discharge.  Neck: Normal range of motion.  Cardiovascular: Normal rate and intact distal pulses.   2+ radial pulses bilaterally  Pulmonary/Chest: Effort normal.  Abdominal: He exhibits no distension.  Musculoskeletal: Normal range of motion. He exhibits no edema.  (See attached images). 2cm tissue avulsion to distal left thumb. Bleeding controlled. The skin does not come together easily. Injury does not appear to extend past the dermis into the subcutaneous tissue. There is a portion of disruption of the nail, nailbed is not disrupted and the nail plate is still firmly adhered to the nailbed. Full R OM of this digit, hand, and wrist. 5/5 strength of the digit with flexion and  extension against resistance with good grip strength. No crepitus noted  Neurological: He is alert. No sensory deficit.  Fluent speech, no facial droop, sensation to soft touch of the left hand and digits is intact.  Skin: Skin is warm and dry. Capillary refill takes less than 2 seconds. No erythema.  Psychiatric: He has a normal mood and affect. His behavior is normal.  Nursing note and vitals reviewed.       ED Treatments / Results  DIAGNOSTIC STUDIES: Oxygen Saturation is 98% on RA, normal by my interpretation.   COORDINATION OF CARE: 2:09 PM-Discussed next steps with pt. Pt verbalized understanding and is agreeable with the plan.   Labs (all labs ordered are listed, but only abnormal results are displayed) Labs Reviewed - No data to display  EKG  EKG Interpretation None      Radiology Dg Finger Thumb Left  Result Date: 12/27/2016 CLINICAL DATA:  Laceration to the thumb with table saw EXAM: LEFT THUMB 2+V COMPARISON:  Wrist series 2 02/12/2017 FINDINGS: Degenerative changes at the MCP joint, stable since prior study. Well corticated bone density adjacent to the MCP joint and stable since prior wrist series and may be related to old fracture or sesamoid bone. No acute fracture, subluxation or dislocation. No radiopaque foreign body. IMPRESSION: No acute bony abnormality. Electronically Signed   By: Rolm Baptise M.D.   On: 12/27/2016 14:07    Procedures Procedures   Medications Ordered in ED Medications - No data to display  Initial Impression / Assessment and Plan / ED Course  I have reviewed the triage vital signs and the nursing notes.  Pertinent labs & imaging results that were available during my care of the patient were reviewed by me and considered in my medical decision making (see chart for details).     Patient presents with tissue avulsion to  the left thumb due to table saw. Afebrile, vital signs are at baseline, neurovascularly intact. X-ray shows no  fracture or dislocation. Tetanus is up-to-date. Laceration does not appear to extend deeply past the dermis and the wound edges cannot be approximated easily when pushed together. Plan to heal with secondary intention. Extensive irrigation with normal saline and cleaning with Betadine performed. Wound covered with Xeroform gauze and 2 x 2 gauze. Patient tolerated this well. Discussed indications for return to the ED if any concerning signs or symptoms of infection develop. He will follow up with his primary care as scheduled for wound check and medical follow-up after hospital discharge. Pt verbalized understanding of and agreement with plan and is safe for discharge home at this time.  Final Clinical Impressions(s) / ED Diagnoses   Final diagnoses:  Avulsion of skin of left thumb without complication, initial encounter   New Prescriptions New Prescriptions   No medications on file   I personally performed the services described in this documentation, which was scribed in my presence. The recorded information has been reviewed and is accurate.     Renita Papa, PA-C 12/27/16 1442    Malvin Johns, MD 12/27/16 [phone]

## 2016-12-30 ENCOUNTER — Telehealth: Payer: Self-pay

## 2016-12-30 DIAGNOSIS — D508 Other iron deficiency anemias: Secondary | ICD-10-CM

## 2016-12-30 NOTE — Telephone Encounter (Signed)
Wife notified of the need for lab in 1 month

## 2016-12-30 NOTE — Telephone Encounter (Signed)
-----   Message from Gatha Mayer, MD sent at 12/30/2016  7:31 AM EDT ----- Hgb slightly lower but had bump in creatinine etc so that could do it  Let's repeat the CBC early July dx anemia chronic dz ----- Message ----- From: Marlon Pel, RN Sent: 12/28/2016  10:00 AM To: Gatha Mayer, MD  Patient is due for CBC for follow up of anemia.  Just had CBC last week.  Please review and advise ----- Message ----- From: Marlon Pel, RN Sent: 12/26/2016 To: Marlon Pel, RN  Needs labs see results 4/2

## 2017-01-01 ENCOUNTER — Other Ambulatory Visit: Payer: Self-pay | Admitting: *Deleted

## 2017-01-01 MED ORDER — FUROSEMIDE 20 MG PO TABS: 40.0000 mg | ORAL_TABLET | Freq: Every day | ORAL | 3 refills | Status: DC

## 2017-01-04 ENCOUNTER — Other Ambulatory Visit: Payer: Self-pay

## 2017-01-04 NOTE — Patient Outreach (Signed)
[name]) Care Management  01/04/2017  Michael [name] June 23, 1941 [id number]     EMMI-HF RED ON EMMI ALERT Day # 9 Date: 01/03/17 Red Alert Reason: "Filled new prescriptions? No"   Outreach attempt #  1 to patient. No answer at present.        Plan: RN CM will make outreach attempt to patient within one business day.   Enzo Montgomery, RN,BSN,CCM [facility] Management Telephonic Care Management Coordinator Direct Phone: [phone] Toll Free: [phone] Fax: [phone]

## 2017-01-05 ENCOUNTER — Inpatient Hospital Stay: Payer: Medicare Other | Admitting: Internal Medicine

## 2017-01-05 ENCOUNTER — Other Ambulatory Visit: Payer: Self-pay | Admitting: Cardiovascular Disease

## 2017-01-05 ENCOUNTER — Other Ambulatory Visit: Payer: Self-pay

## 2017-01-05 ENCOUNTER — Telehealth: Payer: Self-pay | Admitting: *Deleted

## 2017-01-05 NOTE — Patient Outreach (Signed)
[facility]) Care Management  01/05/2017  LARRON ARMOR Jan 18, 1941 [id number]   EMMI-HF RED ON EMMI ALERT Day # 9 Date: 01/03/17 Red Alert Reason: "Filled new prescriptions? No"   Outreach attempt #2 to patient. Spoke with patient. He reports he is doing well since discharge home. Reviewed and addressed red alert with patient. He reports that pharmacy had to order meds but he will be picking them up today. Patient voices that he knows how and when to take his meds and has no questions regarding meds. He has f/u appts in place for 01/13/17 and 01/19/17. Denies any issues with transportation. Patient is weighing himself daily. States weight is staying about the same and just varying a pound or two. He denies any edema. States he is adhering to diuretic regimen. Reviewed with patient s/s of worsening condition and when to alert MD. He voiced understanding. He denies any further RN CM needs or concerns at this time. Advised patient that they would continue to get automated EMMI- HF post discharge calls to assess how they are doing following recent hospitalization and will receive a call from a nurse if any of their responses were abnormal. Patient voiced understanding and was appreciative of f/u call. Advised patient that they would continue to get automated EMMI- HF post discharge calls to assess how they are doing following recent hospitalization and will receive a call from a nurse if any of their responses were abnormal. Patient voiced understanding and was appreciative of f/u call.    Plan: RN CM will notify [facility] administrative assistant of case status.  Enzo Montgomery, RN,BSN,CCM [place] Management Telephonic Care Management Coordinator Direct Phone: [phone] Toll Free: [phone] Fax: [phone]

## 2017-01-05 NOTE — Telephone Encounter (Signed)
-----   Message from Michael Sine, MD sent at 11/14/2016  5:51 PM EDT ----- [name] please schedule for CPAP TITRATION STUDY

## 2017-01-05 NOTE — Telephone Encounter (Signed)
Called to inform patient of sleep study results and in the middle of the converation apparently the patient's wife had a incoming call from her doctor. She placed me on hold stating that he would be right back. I held for approx 3-4 minutes then hung up the phone to call other patients. This is about the 3rd time I have tried to notify the patient of these results, however during this call he was informed that he does in fact have sleep apnea. The conversation was interrupted by his wife before I could give him Dr Evette Georges recommendations. I will wait for th patient to return a call to me so we can finish the conversation.

## 2017-01-06 ENCOUNTER — Telehealth: Payer: Self-pay | Admitting: *Deleted

## 2017-01-06 NOTE — Telephone Encounter (Signed)
Left message ( okay per conversation with wife yesterday) that patient has been referred to [facility] for his CPAP therapy. He should be hearing from them once they get benefits verifed through his insurance company. Their contact information left for them to contact if needed.

## 2017-01-08 ENCOUNTER — Ambulatory Visit: Payer: Medicare Other | Admitting: Cardiology

## 2017-01-12 ENCOUNTER — Other Ambulatory Visit: Payer: Self-pay | Admitting: Internal Medicine

## 2017-01-12 ENCOUNTER — Other Ambulatory Visit: Payer: Self-pay

## 2017-01-12 NOTE — Patient Outreach (Addendum)
[facility]) Care Management  01/12/2017  ALDAHIR LITAKER 16-May-1941 [id number]     EMMI- HF RED ON EMMI ALERT Day # 14 Date: 01/08/17 Red Alert Reason: "New/worsening problems? Yes" Day #: 17 Date: 01/11/17 Red Alert Reason: "New/worsening problems? Yes   New swelling? Yes"    Outreach attempt #  1 to patient. Spoke with spouse. She states patient is doing fine. Reviewed and addressed red alert. Spouse report machine recorded wrong response as patient meant to say no. She denies patient having any new or worsening symptoms. Spouse reports that patient has bene out of the home and being busy doing things which is a good sign. She voices no further RN CM needs or concerns at this time. Advised that patient that they would continue to get automated EMMI- HF post discharge calls to assess how they are doing following recent hospitalization and will receive a call from a nurse if any of their responses were abnormal. Spouse voiced understanding and was appreciative of f/u call.       Plan: RN CM will notify [facility] administrative assistant of case status.    Enzo Montgomery, RN,BSN,CCM [place] Management Telephonic Care Management Coordinator Direct Phone: [phone] Toll Free: [phone] Fax: [phone]

## 2017-01-13 ENCOUNTER — Ambulatory Visit (HOSPITAL_COMMUNITY)
Admission: RE | Admit: 2017-01-13 | Discharge: 2017-01-13 | Disposition: A | Discharge status: Home / Self Care | Payer: Medicare Other | Source: Ambulatory Visit | Attending: Cardiovascular Disease | Admitting: Cardiovascular Disease

## 2017-01-13 DIAGNOSIS — I779 Disorder of arteries and arterioles, unspecified: Secondary | ICD-10-CM

## 2017-01-13 DIAGNOSIS — E1151 Type 2 diabetes mellitus with diabetic peripheral angiopathy without gangrene: Secondary | ICD-10-CM | POA: Diagnosis not present

## 2017-01-13 DIAGNOSIS — E785 Hyperlipidemia, unspecified: Secondary | ICD-10-CM | POA: Diagnosis not present

## 2017-01-13 DIAGNOSIS — I6523 Occlusion and stenosis of bilateral carotid arteries: Secondary | ICD-10-CM | POA: Diagnosis not present

## 2017-01-13 DIAGNOSIS — I10 Essential (primary) hypertension: Secondary | ICD-10-CM | POA: Diagnosis not present

## 2017-01-13 DIAGNOSIS — I739 Peripheral vascular disease, unspecified: Secondary | ICD-10-CM

## 2017-01-19 ENCOUNTER — Encounter: Payer: Self-pay | Admitting: Internal Medicine

## 2017-01-19 ENCOUNTER — Other Ambulatory Visit (INDEPENDENT_AMBULATORY_CARE_PROVIDER_SITE_OTHER): Payer: Medicare Other

## 2017-01-19 ENCOUNTER — Ambulatory Visit (INDEPENDENT_AMBULATORY_CARE_PROVIDER_SITE_OTHER): Payer: Medicare Other | Admitting: Internal Medicine

## 2017-01-19 VITALS — BP 132/64 | HR 45 | Temp 97.6°F | Ht 71.0 in | Wt 248.0 lb

## 2017-01-19 DIAGNOSIS — I10 Essential (primary) hypertension: Secondary | ICD-10-CM

## 2017-01-19 DIAGNOSIS — E039 Hypothyroidism, unspecified: Secondary | ICD-10-CM

## 2017-01-19 DIAGNOSIS — E785 Hyperlipidemia, unspecified: Secondary | ICD-10-CM | POA: Diagnosis not present

## 2017-01-19 DIAGNOSIS — R739 Hyperglycemia, unspecified: Secondary | ICD-10-CM

## 2017-01-19 DIAGNOSIS — I2510 Atherosclerotic heart disease of native coronary artery without angina pectoris: Secondary | ICD-10-CM

## 2017-01-19 DIAGNOSIS — I481 Persistent atrial fibrillation: Secondary | ICD-10-CM | POA: Diagnosis not present

## 2017-01-19 DIAGNOSIS — M1000 Idiopathic gout, unspecified site: Secondary | ICD-10-CM | POA: Diagnosis not present

## 2017-01-19 DIAGNOSIS — Z9861 Coronary angioplasty status: Secondary | ICD-10-CM | POA: Diagnosis not present

## 2017-01-19 DIAGNOSIS — I4819 Other persistent atrial fibrillation: Secondary | ICD-10-CM

## 2017-01-19 LAB — BASIC METABOLIC PANEL
BUN: 29 mg/dL — AB (ref 6–23)
CHLORIDE: 102 meq/L (ref 96–112)
CO2: 28 mEq/L (ref 19–32)
CREATININE: 1.71 mg/dL — AB (ref 0.40–1.50)
Calcium: 9.5 mg/dL (ref 8.4–10.5)
GFR: 41.77 mL/min — AB (ref >60.00)
GLUCOSE: 134 mg/dL — AB (ref 70–99)
Potassium: 4.6 mEq/L (ref 3.5–5.1)
Sodium: 136 mEq/L (ref 135–145)

## 2017-01-19 LAB — HEMOGLOBIN A1C: HEMOGLOBIN A1C: 5.4 % (ref 4.6–6.5)

## 2017-01-19 LAB — URIC ACID: Uric Acid, Serum: 5.6 mg/dL (ref 4.0–7.8)

## 2017-01-19 NOTE — Assessment & Plan Note (Addendum)
On amlodipine, cardizem, stopped Eliquis On ASA and Plavix

## 2017-01-19 NOTE — Assessment & Plan Note (Signed)
On Lipitor 

## 2017-01-19 NOTE — Assessment & Plan Note (Signed)
On amlodipine, cardizem

## 2017-01-19 NOTE — Assessment & Plan Note (Signed)
On Allopurinol 

## 2017-01-19 NOTE — Progress Notes (Signed)
Subjective:  Patient ID: Michael [name], male    DOB: [birth year]  Age: 76 y.o. MRN: [id number]  CC: No chief complaint on file.   HPI LONNY EISEN presents for a DM, HTN, gout, CAD f/u  Outpatient Medications Prior to Visit  Medication Sig Dispense Refill  . allopurinol (ZYLOPRIM) 100 MG tablet Take 2 tablets (200 mg total) by mouth daily. 60 tablet 11  . amiodarone (PACERONE) 200 MG tablet TAKE 1 TABLET BY MOUTH DAILY 30 tablet 6  . aspirin 81 MG chewable tablet Chew 1 tablet (81 mg total) by mouth daily. 30 tablet 0  . atorvastatin (LIPITOR) 40 MG tablet take 1 tablet by mouth once daily 90 tablet 2  . Blood Glucose Monitoring Suppl (ONE TOUCH ULTRA 2) W/DEVICE KIT Use as directed Dx E11.9 1 each 0  . Blood Pressure Monitor KIT Use to check blood pressure daily Dx I10 1 each 0  . CARTIA XT 180 MG 24 hr capsule take 1 capsule by mouth once daily 90 capsule 3  . Cholecalciferol 1000 UNITS TBDP Take 1,000-5,000 Units by mouth daily. Takes 1000 units everyday except for on Saturday patient takes 5000 units    . clopidogrel (PLAVIX) 75 MG tablet Take 1 tablet (75 mg total) by mouth daily with breakfast. 90 tablet 3  . fenofibrate 160 MG tablet TAKE 1 TABLET(160 MG) BY MOUTH DAILY 90 tablet 3  . ferrous sulfate 325 (65 FE) MG tablet Take 325 mg by mouth 3 (three) times daily with meals.    . furosemide (LASIX) 20 MG tablet Take 2 tablets (40 mg total) by mouth daily. 60 tablet 3  . glipiZIDE (GLUCOTROL) 10 MG tablet Take 1 tablet (10 mg total) by mouth 2 (two) times daily before a meal. 60 tablet 5  . glucose blood (ONE TOUCH ULTRA TEST) test strip 1 each by Other route 4 (four) times daily. Use to check blood sugar four times a day  Dx: E11.9- insulin Dependant 120 each 5  . hydrALAZINE (APRESOLINE) 25 MG tablet Take 1 tablet (25 mg total) by mouth 3 (three) times daily. 90 tablet 3  . HYDROcodone-acetaminophen (NORCO/VICODIN) 5-325 MG tablet Take 1 tablet by mouth 2 (two) times daily as  needed for moderate pain. 60 tablet 0  . Insulin Glargine (TOUJEO SOLOSTAR) 300 UNIT/ML SOPN Inject 52 Units into the skin daily.    . Insulin Pen Needle 31G X 5 MM MISC 1 Units by Does not apply route 4 (four) times daily. 150 each 11  . Lancets (ONETOUCH ULTRASOFT) lancets Use to check blood sugars daily 30 each 11  . levothyroxine (SYNTHROID, LEVOTHROID) 150 MCG tablet TAKE 1 TABLET BY MOUTH DAILY BEFORE BREAKFAST 90 tablet 3  . Metoprolol Tartrate 75 MG TABS Take 75 mg by mouth 2 (two) times daily. 180 tablet 3  . omeprazole (PRILOSEC) 40 MG capsule take 1 capsule by mouth once daily 90 capsule 1  . potassium chloride SA (K-DUR,KLOR-CON) 20 MEQ tablet Take 1 tablet (20 mEq total) by mouth daily. 30 tablet 3   No facility-administered medications prior to visit.     ROS Review of Systems  Constitutional: Positive for unexpected weight change. Negative for appetite change and fatigue.  HENT: Negative for congestion, nosebleeds, sneezing, sore throat and trouble swallowing.   Eyes: Negative for itching and visual disturbance.  Respiratory: Negative for cough.   Cardiovascular: Negative for chest pain, palpitations and leg swelling.  Gastrointestinal: Negative for abdominal distention, blood in stool, diarrhea  and nausea.  Genitourinary: Negative for frequency and hematuria.  Musculoskeletal: Positive for arthralgias and back pain. Negative for gait problem, joint swelling and neck pain.  Skin: Negative for rash.  Neurological: Negative for dizziness, tremors, speech difficulty and weakness.  Psychiatric/Behavioral: Negative for agitation, dysphoric mood and sleep disturbance. The patient is not nervous/anxious.     Objective:  BP 132/64 (BP Location: Left Arm, Patient Position: Sitting, Cuff Size: Large)   Pulse (!) 45   Temp 97.6 F (36.4 C) (Oral)   Ht 5' 11"  (1.803 m)   Wt 248 lb (112.5 kg)   SpO2 99%   BMI 34.59 kg/m   BP Readings from Last 3 Encounters:  01/19/17 132/64    12/27/16 (!) 158/59  12/24/16 (!) 159/57    Wt Readings from Last 3 Encounters:  01/19/17 248 lb (112.5 kg)  12/24/16 245 lb 4.8 oz (111.3 kg)  12/08/16 250 lb (113.4 kg)    Physical Exam  Constitutional: He is oriented to person, place, and time. He appears well-developed. No distress.  NAD  HENT:  Mouth/Throat: Oropharynx is clear and moist.  Eyes: Conjunctivae are normal. Pupils are equal, round, and reactive to light.  Neck: Normal range of motion. No JVD present. No thyromegaly present.  Cardiovascular: Normal rate, regular rhythm, normal heart sounds and intact distal pulses.  Exam reveals no gallop and no friction rub.   No murmur heard. Pulmonary/Chest: Effort normal and breath sounds normal. No respiratory distress. He has no wheezes. He has no rales. He exhibits no tenderness.  Abdominal: Soft. Bowel sounds are normal. He exhibits no distension and no mass. There is no tenderness. There is no rebound and no guarding.  Musculoskeletal: Normal range of motion. He exhibits tenderness. He exhibits no edema.  Lymphadenopathy:    He has no cervical adenopathy.  Neurological: He is alert and oriented to person, place, and time. He has normal reflexes. No cranial nerve deficit. He exhibits normal muscle tone. He displays a negative Romberg sign. Coordination and gait normal.  Skin: Skin is warm and dry. No rash noted.  Psychiatric: He has a normal mood and affect. His behavior is normal. Judgment and thought content normal.  Obese  Lab Results  Component Value Date   WBC 4.9 12/22/2016   HGB 10.2 (L) 12/22/2016   HCT 31.7 (L) 12/22/2016   PLT 139 (L) 12/22/2016   GLUCOSE 166 (H) 12/24/2016   CHOL 101 08/01/2016   TRIG 344 (H) 08/01/2016   HDL 22 (L) 08/01/2016   LDLDIRECT 26.0 03/20/2015   LDLCALC 10 08/01/2016   ALT 9 (L) 08/10/2016   AST 23 08/10/2016   NA 137 12/24/2016   K 3.8 12/24/2016   CL 103 12/24/2016   CREATININE 1.88 (H) 12/24/2016   BUN 25 (H)  12/24/2016   CO2 26 12/24/2016   TSH 4.452 07/27/2016   PSA 0.50 11/07/2013   INR 1.06 12/08/2016   HGBA1C 6.0 10/13/2016   MICROALBUR 1.2 05/16/2013    No results found.  Assessment & Plan:   There are no diagnoses linked to this encounter. I am having Mr. Barrows maintain his ferrous sulfate, ONE TOUCH ULTRA 2, onetouch ultrasoft, Cholecalciferol, Blood Pressure Monitor, allopurinol, levothyroxine, Insulin Pen Needle, glucose blood, amiodarone, aspirin, fenofibrate, hydrALAZINE, potassium chloride SA, Insulin Glargine, HYDROcodone-acetaminophen, glipiZIDE, clopidogrel, Metoprolol Tartrate, CARTIA XT, furosemide, atorvastatin, and omeprazole.  No orders of the defined types were placed in this encounter.    Follow-up: No Follow-up on file.  [name], MD

## 2017-01-19 NOTE — Assessment & Plan Note (Signed)
On Levothroid 

## 2017-01-22 ENCOUNTER — Other Ambulatory Visit: Payer: Self-pay

## 2017-01-22 NOTE — Patient Outreach (Signed)
Patient triggered RED on  EMMI Heart Failure Dashboard, notification sent to:  Enzo Montgomery, RN

## 2017-01-22 NOTE — Patient Outreach (Signed)
[facility]) Care Management  01/22/2017  Michael [name] [birth year] [id number]     EMMI- HF RED ON EMMI ALERT Day # 27 Date: 01/21/17 Red Alert Reason: "Sad, hopeless or empty? Yes"     Outreach attempt # 1 to patient. Spoke with spouse who assists patient with answering daily automated calls. Reviewed and addressed red alert. Spouse states that patient did not hear and understand the questions correctly and responded in error. She voices patient is in good spirits and doing well. Weight is stable. He is having no symptoms or issues at this time. She voices no RN CM needs or concerns at this time. Spouse familiar with automated HF calls and is aware that they will receive a call from a nurse if any of his responses are abnormal. She was appreciative of f/u call.        Plan: RN CM will notify [facility] administrative assistant of case status.    Enzo Montgomery, RN,BSN,CCM [facility] Telephonic Care Management Coordinator Direct Phone: [phone] Toll Free: [phone] Fax: [phone]

## 2017-02-04 ENCOUNTER — Other Ambulatory Visit (INDEPENDENT_AMBULATORY_CARE_PROVIDER_SITE_OTHER): Payer: Medicare Other

## 2017-02-04 ENCOUNTER — Other Ambulatory Visit: Payer: Self-pay | Admitting: Internal Medicine

## 2017-02-04 ENCOUNTER — Telehealth: Payer: Self-pay | Admitting: Internal Medicine

## 2017-02-04 DIAGNOSIS — D508 Other iron deficiency anemias: Secondary | ICD-10-CM | POA: Diagnosis not present

## 2017-02-04 LAB — CBC WITH DIFFERENTIAL/PLATELET
BASOS ABS: 0 10*3/uL (ref 0.0–0.1)
BASOS PCT: 0.8 % (ref 0.0–3.0)
EOS ABS: 0.1 10*3/uL (ref 0.0–0.7)
Eosinophils Relative: 2.3 % (ref 0.0–5.0)
HCT: 32.2 % — ABNORMAL LOW (ref 39.0–52.0)
Hemoglobin: 11.1 g/dL — ABNORMAL LOW (ref 13.0–17.0)
Lymphocytes Relative: 17.9 % (ref 12.0–46.0)
Lymphs Abs: 1 10*3/uL (ref 0.7–4.0)
MCHC: 34.4 g/dL (ref 30.0–36.0)
MCV: 86.9 fl (ref 78.0–100.0)
MONO ABS: 0.4 10*3/uL (ref 0.1–1.0)
Monocytes Relative: 7.5 % (ref 3.0–12.0)
NEUTROS ABS: 4.1 10*3/uL (ref 1.4–7.7)
NEUTROS PCT: 71.5 % (ref 43.0–77.0)
Platelets: 148 10*3/uL — ABNORMAL LOW (ref 150.0–400.0)
RBC: 3.71 Mil/uL — ABNORMAL LOW (ref 4.22–5.81)
RDW: 16.3 % — AB (ref 11.5–15.5)
WBC: 5.8 10*3/uL (ref 4.0–10.5)

## 2017-02-04 MED ORDER — HYDRALAZINE HCL 25 MG PO TABS: 25.0000 mg | ORAL_TABLET | Freq: Three times a day (TID) | ORAL | 3 refills | Status: DC

## 2017-02-04 NOTE — Telephone Encounter (Signed)
Pt is needing hydrALAZINE (APRESOLINE) 25 MG tablet sent to [facility] on [address].

## 2017-02-05 ENCOUNTER — Emergency Department (HOSPITAL_COMMUNITY): Payer: Medicare Other

## 2017-02-05 ENCOUNTER — Inpatient Hospital Stay (HOSPITAL_COMMUNITY)
Admission: EM | Admit: 2017-02-05 | Discharge: 2017-02-07 | DRG: 291 | Disposition: A | Discharge status: Home / Self Care | Payer: Medicare Other | Attending: Family Medicine | Admitting: Family Medicine

## 2017-02-05 ENCOUNTER — Telehealth: Payer: Self-pay | Admitting: Internal Medicine

## 2017-02-05 ENCOUNTER — Encounter (HOSPITAL_COMMUNITY): Payer: Self-pay | Admitting: Emergency Medicine

## 2017-02-05 DIAGNOSIS — E1151 Type 2 diabetes mellitus with diabetic peripheral angiopathy without gangrene: Secondary | ICD-10-CM | POA: Diagnosis present

## 2017-02-05 DIAGNOSIS — Z955 Presence of coronary angioplasty implant and graft: Secondary | ICD-10-CM | POA: Diagnosis not present

## 2017-02-05 DIAGNOSIS — I130 Hypertensive heart and chronic kidney disease with heart failure and stage 1 through stage 4 chronic kidney disease, or unspecified chronic kidney disease: Secondary | ICD-10-CM | POA: Diagnosis present

## 2017-02-05 DIAGNOSIS — I340 Nonrheumatic mitral (valve) insufficiency: Secondary | ICD-10-CM | POA: Diagnosis not present

## 2017-02-05 DIAGNOSIS — I5089 Other heart failure: Secondary | ICD-10-CM | POA: Diagnosis not present

## 2017-02-05 DIAGNOSIS — Z794 Long term (current) use of insulin: Secondary | ICD-10-CM

## 2017-02-05 DIAGNOSIS — E1122 Type 2 diabetes mellitus with diabetic chronic kidney disease: Secondary | ICD-10-CM | POA: Diagnosis present

## 2017-02-05 DIAGNOSIS — R0602 Shortness of breath: Secondary | ICD-10-CM | POA: Diagnosis not present

## 2017-02-05 DIAGNOSIS — Z87891 Personal history of nicotine dependence: Secondary | ICD-10-CM

## 2017-02-05 DIAGNOSIS — I110 Hypertensive heart disease with heart failure: Secondary | ICD-10-CM | POA: Diagnosis not present

## 2017-02-05 DIAGNOSIS — Z7902 Long term (current) use of antithrombotics/antiplatelets: Secondary | ICD-10-CM

## 2017-02-05 DIAGNOSIS — E039 Hypothyroidism, unspecified: Secondary | ICD-10-CM | POA: Diagnosis not present

## 2017-02-05 DIAGNOSIS — D500 Iron deficiency anemia secondary to blood loss (chronic): Secondary | ICD-10-CM | POA: Diagnosis not present

## 2017-02-05 DIAGNOSIS — I5033 Acute on chronic diastolic (congestive) heart failure: Secondary | ICD-10-CM | POA: Diagnosis not present

## 2017-02-05 DIAGNOSIS — E785 Hyperlipidemia, unspecified: Secondary | ICD-10-CM | POA: Diagnosis present

## 2017-02-05 DIAGNOSIS — I440 Atrioventricular block, first degree: Secondary | ICD-10-CM | POA: Diagnosis present

## 2017-02-05 DIAGNOSIS — Z79899 Other long term (current) drug therapy: Secondary | ICD-10-CM | POA: Diagnosis not present

## 2017-02-05 DIAGNOSIS — Z885 Allergy status to narcotic agent status: Secondary | ICD-10-CM

## 2017-02-05 DIAGNOSIS — N183 Chronic kidney disease, stage 3 unspecified: Secondary | ICD-10-CM | POA: Diagnosis present

## 2017-02-05 DIAGNOSIS — K219 Gastro-esophageal reflux disease without esophagitis: Secondary | ICD-10-CM | POA: Diagnosis not present

## 2017-02-05 DIAGNOSIS — I509 Heart failure, unspecified: Secondary | ICD-10-CM

## 2017-02-05 DIAGNOSIS — I480 Paroxysmal atrial fibrillation: Secondary | ICD-10-CM | POA: Diagnosis not present

## 2017-02-05 DIAGNOSIS — Z833 Family history of diabetes mellitus: Secondary | ICD-10-CM | POA: Diagnosis not present

## 2017-02-05 DIAGNOSIS — I10 Essential (primary) hypertension: Secondary | ICD-10-CM | POA: Diagnosis not present

## 2017-02-05 DIAGNOSIS — M109 Gout, unspecified: Secondary | ICD-10-CM | POA: Diagnosis present

## 2017-02-05 DIAGNOSIS — I2510 Atherosclerotic heart disease of native coronary artery without angina pectoris: Secondary | ICD-10-CM | POA: Diagnosis present

## 2017-02-05 DIAGNOSIS — Z888 Allergy status to other drugs, medicaments and biological substances status: Secondary | ICD-10-CM

## 2017-02-05 DIAGNOSIS — Z8249 Family history of ischemic heart disease and other diseases of the circulatory system: Secondary | ICD-10-CM

## 2017-02-05 HISTORY — DX: Acute on chronic diastolic (congestive) heart failure: I50.33

## 2017-02-05 LAB — BASIC METABOLIC PANEL
Anion gap: 7 (ref 5–15)
BUN: 41 mg/dL — ABNORMAL HIGH (ref 6–20)
CALCIUM: 8.7 mg/dL — AB (ref 8.9–10.3)
CHLORIDE: 109 mmol/L (ref 101–111)
CO2: 23 mmol/L (ref 22–32)
CREATININE: 1.75 mg/dL — AB (ref 0.61–1.24)
GFR calc non Af Amer: 37 mL/min — ABNORMAL LOW (ref >60)
GFR, EST AFRICAN AMERICAN: 42 mL/min — AB (ref >60)
Glucose, Bld: 177 mg/dL — ABNORMAL HIGH (ref 65–99)
Potassium: 4.8 mmol/L (ref 3.5–5.1)
SODIUM: 139 mmol/L (ref 135–145)

## 2017-02-05 LAB — CBC
HCT: 29.1 % — ABNORMAL LOW (ref 39.0–52.0)
Hemoglobin: 9.8 g/dL — ABNORMAL LOW (ref 13.0–17.0)
MCH: 29.7 pg (ref 26.0–34.0)
MCHC: 33.7 g/dL (ref 30.0–36.0)
MCV: 88.2 fL (ref 78.0–100.0)
PLATELETS: 129 10*3/uL — AB (ref 150–400)
RBC: 3.3 MIL/uL — AB (ref 4.22–5.81)
RDW: 15.9 % — ABNORMAL HIGH (ref 11.5–15.5)
WBC: 4.5 10*3/uL (ref 4.0–10.5)

## 2017-02-05 LAB — POCT I-STAT TROPONIN I: TROPONIN I, POC: 0.03 ng/mL (ref 0.00–0.08)

## 2017-02-05 LAB — TROPONIN I: Troponin I: <0.03 ng/mL (ref <0.03)

## 2017-02-05 LAB — GLUCOSE, CAPILLARY: GLUCOSE-CAPILLARY: 90 mg/dL (ref 65–99)

## 2017-02-05 LAB — BRAIN NATRIURETIC PEPTIDE: B NATRIURETIC PEPTIDE 5: 527.3 pg/mL — AB (ref 0.0–100.0)

## 2017-02-05 MED ORDER — ONDANSETRON HCL 4 MG/2ML IJ SOLN: 4.0000 mg | Freq: Four times a day (QID) | INTRAMUSCULAR | Status: DC | PRN

## 2017-02-05 MED ORDER — DILTIAZEM HCL ER COATED BEADS 180 MG PO CP24
180.0000 mg | ORAL_CAPSULE | Freq: Every day | ORAL | Status: DC
  Administered 2017-02-07: 180 mg via ORAL
  Filled 2017-02-05 (×2): qty 1

## 2017-02-05 MED ORDER — DM-GUAIFENESIN ER 30-600 MG PO TB12: 1.0000 | ORAL_TABLET | Freq: Two times a day (BID) | ORAL | Status: DC | PRN

## 2017-02-05 MED ORDER — HYDRALAZINE HCL 20 MG/ML IJ SOLN: 5.0000 mg | INTRAMUSCULAR | Status: DC | PRN

## 2017-02-05 MED ORDER — ALBUTEROL SULFATE (2.5 MG/3ML) 0.083% IN NEBU: 2.5000 mg | INHALATION_SOLUTION | Freq: Four times a day (QID) | RESPIRATORY_TRACT | Status: DC | PRN

## 2017-02-05 MED ORDER — LEVOTHYROXINE SODIUM 150 MCG PO TABS
150.0000 ug | ORAL_TABLET | Freq: Every day | ORAL | Status: DC
  Administered 2017-02-06 – 2017-02-07 (×2): 150 ug via ORAL
  Filled 2017-02-05 (×2): qty 3
  Filled 2017-02-05 (×2): qty 1

## 2017-02-05 MED ORDER — FENOFIBRATE 160 MG PO TABS
160.0000 mg | ORAL_TABLET | Freq: Every day | ORAL | Status: DC
  Administered 2017-02-06 – 2017-02-07 (×2): 160 mg via ORAL
  Filled 2017-02-05 (×2): qty 1

## 2017-02-05 MED ORDER — CLOPIDOGREL BISULFATE 75 MG PO TABS
75.0000 mg | ORAL_TABLET | Freq: Every day | ORAL | Status: DC
  Administered 2017-02-06 – 2017-02-07 (×2): 75 mg via ORAL
  Filled 2017-02-05 (×2): qty 1

## 2017-02-05 MED ORDER — INSULIN ASPART 100 UNIT/ML ~~LOC~~ SOLN
0.0000 [IU] | Freq: Three times a day (TID) | SUBCUTANEOUS | Status: DC
  Administered 2017-02-06: 1 [IU] via SUBCUTANEOUS
  Administered 2017-02-06: 2 [IU] via SUBCUTANEOUS
  Administered 2017-02-07: 1 [IU] via SUBCUTANEOUS

## 2017-02-05 MED ORDER — SODIUM CHLORIDE 0.9 % IV SOLN
INTRAVENOUS | Status: DC
  Administered 2017-02-05: 22:00:00 via INTRAVENOUS

## 2017-02-05 MED ORDER — ASPIRIN 81 MG PO CHEW
81.0000 mg | CHEWABLE_TABLET | Freq: Every day | ORAL | Status: DC
  Administered 2017-02-06 – 2017-02-07 (×2): 81 mg via ORAL
  Filled 2017-02-05 (×2): qty 1

## 2017-02-05 MED ORDER — ZOLPIDEM TARTRATE 5 MG PO TABS: 5.0000 mg | ORAL_TABLET | Freq: Every evening | ORAL | Status: DC | PRN

## 2017-02-05 MED ORDER — AMIODARONE HCL 200 MG PO TABS
200.0000 mg | ORAL_TABLET | Freq: Every day | ORAL | Status: DC
  Administered 2017-02-07: 200 mg via ORAL
  Filled 2017-02-05 (×2): qty 1

## 2017-02-05 MED ORDER — PANTOPRAZOLE SODIUM 40 MG PO TBEC
40.0000 mg | DELAYED_RELEASE_TABLET | Freq: Every day | ORAL | Status: DC
  Administered 2017-02-06 – 2017-02-07 (×2): 40 mg via ORAL
  Filled 2017-02-05 (×2): qty 1

## 2017-02-05 MED ORDER — ENOXAPARIN SODIUM 40 MG/0.4ML ~~LOC~~ SOLN: 40.0000 mg | Freq: Every day | SUBCUTANEOUS | Status: DC

## 2017-02-05 MED ORDER — FUROSEMIDE 10 MG/ML IJ SOLN
40.0000 mg | Freq: Once | INTRAMUSCULAR | Status: DC
  Filled 2017-02-05: qty 4

## 2017-02-05 MED ORDER — VITAMIN D3 25 MCG (1000 UNIT) PO TABS
1000.0000 [IU] | ORAL_TABLET | ORAL | Status: DC
  Filled 2017-02-05: qty 1

## 2017-02-05 MED ORDER — SODIUM CHLORIDE 0.9% FLUSH
3.0000 mL | Freq: Two times a day (BID) | INTRAVENOUS | Status: DC
  Administered 2017-02-05 – 2017-02-06 (×2): 3 mL via INTRAVENOUS

## 2017-02-05 MED ORDER — ATORVASTATIN CALCIUM 40 MG PO TABS
40.0000 mg | ORAL_TABLET | Freq: Every day | ORAL | Status: DC
  Administered 2017-02-06: 40 mg via ORAL
  Filled 2017-02-05: qty 1

## 2017-02-05 MED ORDER — HYDRALAZINE HCL 25 MG PO TABS
25.0000 mg | ORAL_TABLET | Freq: Three times a day (TID) | ORAL | Status: DC
  Administered 2017-02-05 – 2017-02-07 (×5): 25 mg via ORAL
  Filled 2017-02-05 (×5): qty 1

## 2017-02-05 MED ORDER — SODIUM CHLORIDE 0.9 % IV SOLN: 250.0000 mL | INTRAVENOUS | Status: DC | PRN

## 2017-02-05 MED ORDER — VITAMIN D 1000 UNITS PO TABS
5000.0000 [IU] | ORAL_TABLET | ORAL | Status: DC
  Administered 2017-02-06: 5000 [IU] via ORAL

## 2017-02-05 MED ORDER — METOPROLOL TARTRATE 25 MG PO TABS
75.0000 mg | ORAL_TABLET | Freq: Two times a day (BID) | ORAL | Status: DC
  Administered 2017-02-05 – 2017-02-07 (×3): 75 mg via ORAL
  Filled 2017-02-05 (×4): qty 3

## 2017-02-05 MED ORDER — SODIUM CHLORIDE 0.9% FLUSH: 3.0000 mL | INTRAVENOUS | Status: DC | PRN

## 2017-02-05 MED ORDER — ALLOPURINOL 100 MG PO TABS
200.0000 mg | ORAL_TABLET | Freq: Every day | ORAL | Status: DC
  Administered 2017-02-06 – 2017-02-07 (×2): 200 mg via ORAL
  Filled 2017-02-05 (×2): qty 2

## 2017-02-05 MED ORDER — FERROUS SULFATE 325 (65 FE) MG PO TABS
325.0000 mg | ORAL_TABLET | Freq: Three times a day (TID) | ORAL | Status: DC
  Administered 2017-02-06 – 2017-02-07 (×4): 325 mg via ORAL
  Filled 2017-02-05 (×4): qty 1

## 2017-02-05 MED ORDER — INSULIN GLARGINE 100 UNIT/ML ~~LOC~~ SOLN
40.0000 [IU] | Freq: Every day | SUBCUTANEOUS | Status: DC
  Administered 2017-02-06 – 2017-02-07 (×2): 40 [IU] via SUBCUTANEOUS
  Filled 2017-02-05 (×3): qty 0.4

## 2017-02-05 MED ORDER — HYDROCODONE-ACETAMINOPHEN 5-325 MG PO TABS: 1.0000 | ORAL_TABLET | Freq: Two times a day (BID) | ORAL | Status: DC | PRN

## 2017-02-05 MED ORDER — FLUTICASONE PROPIONATE 50 MCG/ACT NA SUSP
2.0000 | Freq: Every day | NASAL | Status: DC
  Filled 2017-02-05: qty 16

## 2017-02-05 MED ORDER — FUROSEMIDE 10 MG/ML IJ SOLN
40.0000 mg | Freq: Two times a day (BID) | INTRAMUSCULAR | Status: DC
  Administered 2017-02-06 (×2): 40 mg via INTRAVENOUS
  Filled 2017-02-05 (×3): qty 4

## 2017-02-05 NOTE — ED Triage Notes (Signed)
Pt c/o dry cough and nasal drainage, feels like he is choking on nasal drainage when he lays flat, 2-3 pillow orthopnea x 3-4 days, abdominal and pedal edema onset today. Pt states that he feels as though the trouble breathing is soley due to nasal drainage, but wife states pt always feels that way and turns out to have CHF exacerbation. Denies sudden weight gain.

## 2017-02-05 NOTE — ED Provider Notes (Signed)
[facility] DEPT Provider Note   CSN: [id number] Arrival date & time: 02/05/17  1531     History   Chief Complaint Chief Complaint  Patient presents with  . Shortness of Breath    HPI HAVOC SANLUIS is a 76 y.o. male.  76 year old male presents with dry cough and dyspnea on exertion along with orthopnea 3-4 days. Also notes increase in pedal edema as well as abdominal distention. Denies any anginal type chest pain or chest pressure. Does have a history of CHF and this feels similar. Denies any fever or chills. Cough is been nonproductive. Has been compliant with his diuretics and does note increased weight gain.      Past Medical History:  Diagnosis Date  . Acute on chronic heart failure ([place])   . Allergy   . Arthritis   . AVM (arteriovenous malformation) of colon   . C. difficile enteritis   . CAD (coronary artery disease)    a. Cath 10/2014 - Mild to moderate diagonal, circumflex and obtuse marginal disease. Innominate artery sternosis.  . Carotid artery disease ([place])    a. Carotid dopple 03/2014 87-56% RICA &  43-32% LICA stenosis b. 9/51  . CKD (chronic kidney disease), stage III   . Clostridium difficile colitis 05/13/2016  . Dyspnea   . Dysrhythmia    ATRIAL FIBRILATION  . Elevated troponin    a. 09/2014 in setting of AF RVR-->Myoview: EF 49%, no ischemia/infarct->Med Rx.  . GERD (gastroesophageal reflux disease)   . Gout   . H/O transfusion of whole blood   . Heart murmur   . History of PFTs    PFTs 6/16:  FVC 3.73 (87%), FEV1 2.93 (88%), FEV1/FVC 78%, DLCO 69%  . Hx of adenomatous colonic polyps 07/02/2016  . Hyperlipidemia   . Hypertension   . Hypothyroidism   . Iron deficiency anemia   . PAF (paroxysmal atrial fibrillation) ([place])    a. 09/2014: Converted on D[place];  b. CHA2DS2VASc = 3-->eliquis;  c. 09/2014 Echo: EF 55-60%, mild LVH, mildly dil LA.  [name] Personal history of colonic polyps 2007, 2008   adenoma each time, largest 12 mm in 2007  . Psoriasis     . Staph infection 07/2016   left hand   . Staphylococcus aureus bacteremia 07/31/2016  . Type II diabetes mellitus ([place])    TYPE 2    Patient Active Problem List   Diagnosis Date Noted  . History of GI bleed 08/26/2016  . Septic phlebitis of upper extremity 08/26/2016  . Acute on chronic diastolic CHF (congestive heart failure) ([place]) 08/10/2016  . Coronary artery disease involving native coronary artery of native heart without angina pectoris 07/31/2016  . PAD (peripheral artery disease) ([place]) 07/31/2016  . Septic thrombophlebitis of upper extremity 07/31/2016  . History of Clostridium difficile colitis 07/31/2016  . Nonrheumatic aortic valve stenosis   . Chronic diastolic CHF (congestive heart failure) ([place]) 07/27/2016  . Hx of adenomatous colonic polyps 07/02/2016  . Shortness of breath 06/05/2016  . Hypothyroidism 03/17/2016  . Noncompliance with diet and medication regimen 12/16/2015  . Frequency-urgency syndrome 03/08/2015  . Subclavian artery stenosis, right ([place]) 01/16/2015  . Cholelithiasis 01/16/2015  . Restless leg syndrome 01/10/2015  . Postherpetic neuralgia 12/18/2014  . Persistent atrial fibrillation ([place])   . Hyperlipidemia   . Essential hypertension   . Psoriasis 03/19/2014  . Vitamin D deficiency 11/13/2013  . Carotid stenosis 11/06/2013  . Psoriatic arthritis ([place]) 11/06/2013  . Hypertriglyceridemia 05/16/2013  . Morbid obesity  due to excess calories ([place]) 09/10/2012  . Gout 09/10/2012  . AVM (arteriovenous malformation) of small bowel, acquired with hemorrhage ([place]) 01/20/2012  . Iron deficiency anemia due to chronic blood loss 01/20/2012  . DM (diabetes mellitus), type 2 with peripheral vascular complications ([place]) 16/04/9603    Past Surgical History:  Procedure Laterality Date  . APPENDECTOMY  02/09/2014  . CARDIAC CATHETERIZATION N/A 07/29/2016   Procedure: Right/Left Heart Cath and Coronary Angiography;  Surgeon: Nelva Bush, MD;  Location: [facility] CV LAB;  Service: Cardiovascular;  Laterality: N/A;  . CARDIAC CATHETERIZATION N/A 07/29/2016   Procedure: Coronary Stent Intervention;  Surgeon: Nelva Bush, MD;  Location: [facility] CV LAB;  Service: Cardiovascular;  Laterality: N/A;  . COLONOSCOPY  02/10/2011   internal hemorrhoids  . COLONOSCOPY W/ POLYPECTOMY  04/09/2006   12 mm adenoma  . COLONOSCOPY W/ POLYPECTOMY  06/17/2007   5 mm adenoma  . COLONOSCOPY WITH PROPOFOL N/A 05/13/2016   Procedure: COLONOSCOPY WITH PROPOFOL;  Surgeon: Manus Gunning, MD;  Location: [facility] ENDOSCOPY;  Service: Gastroenterology;  Laterality: N/A;  . CORONARY STENT PLACEMENT  07/29/2016   OMI    DES  . ENTEROSCOPY N/A 08/14/2016   Procedure: ENTEROSCOPY;  Surgeon: Manus Gunning, MD;  Location: [facility] ENDOSCOPY;  Service: Gastroenterology;  Laterality: N/A;  . ESOPHAGOGASTRODUODENOSCOPY  12/23/2011   Procedure: ESOPHAGOGASTRODUODENOSCOPY (EGD);  Surgeon: Irene Shipper, MD;  Location: [facility];  Service: Endoscopy;  Laterality: N/A;  with small bowel bx's  . GIVENS CAPSULE STUDY  12/28/2011  . GIVENS CAPSULE STUDY N/A 08/14/2016   Procedure: GIVENS CAPSULE STUDY;  Surgeon: Manus Gunning, MD;  Location: [facility];  Service: Gastroenterology;  Laterality: N/A;  . KNEE ARTHROSCOPY Right   . LAPAROSCOPIC APPENDECTOMY N/A 02/09/2014   Procedure: APPENDECTOMY LAPAROSCOPIC;  Surgeon: Leighton Ruff, MD;  Location: [facility] ORS;  Service: General;  Laterality: N/A;  . LEFT HEART CATHETERIZATION WITH CORONARY ANGIOGRAM N/A 11/15/2014   Procedure: LEFT HEART CATHETERIZATION WITH CORONARY ANGIOGRAM;  Surgeon: Belva Crome, MD;  Location: [facility] CATH LAB;  Service: Cardiovascular;  Laterality: N/A;  . ROTATOR CUFF REPAIR     left       Home Medications    Prior to Admission medications   Medication Sig Start Date End Date Taking? Authorizing Provider  allopurinol (ZYLOPRIM) 100 MG tablet take 2 tablets by mouth daily 02/04/17  Yes [name],  Evie Lacks, MD  amiodarone (PACERONE) 200 MG tablet TAKE 1 TABLET BY MOUTH DAILY 06/24/16  Yes Wellington Hampshire, MD  aspirin 81 MG chewable tablet Chew 1 tablet (81 mg total) by mouth daily. 08/05/16  Yes Velvet Bathe, MD  atorvastatin (LIPITOR) 40 MG tablet take 1 tablet by mouth once daily 01/05/17  Yes Josue Hector, MD  CARTIA XT 180 MG 24 hr capsule take 1 capsule by mouth once daily 11/30/16  Yes Wellington Hampshire, MD  Cholecalciferol 1000 UNITS TBDP Take 1,000-5,000 Units by mouth daily. Takes 1000 units everyday except for on Saturday patient takes 5000 units   Yes [provider]  clopidogrel (PLAVIX) 75 MG tablet Take 1 tablet (75 mg total) by mouth daily with breakfast. 10/16/16  Yes Josue Hector, MD  fenofibrate 160 MG tablet TAKE 1 TABLET(160 MG) BY MOUTH DAILY 08/07/16  Yes Wellington Hampshire, MD  ferrous sulfate 325 (65 FE) MG tablet Take 325 mg by mouth 3 (three) times daily with meals.   Yes [provider]  furosemide (LASIX) 20 MG  tablet Take 2 tablets (40 mg total) by mouth daily. 01/01/17  Yes [name], Evie Lacks, MD  glipiZIDE (GLUCOTROL) 10 MG tablet Take 1 tablet (10 mg total) by mouth 2 (two) times daily before a meal. 10/01/16  Yes [name], Evie Lacks, MD  hydrALAZINE (APRESOLINE) 25 MG tablet Take 1 tablet (25 mg total) by mouth 3 (three) times daily. 02/04/17  Yes [name], Evie Lacks, MD  Insulin Glargine (TOUJEO SOLOSTAR) 300 UNIT/ML SOPN Inject 52 Units into the skin daily.   Yes [provider]  levothyroxine (SYNTHROID, LEVOTHROID) 150 MCG tablet TAKE 1 TABLET BY MOUTH DAILY BEFORE BREAKFAST 03/16/16  Yes [name], Evie Lacks, MD  Metoprolol Tartrate 75 MG TABS Take 75 mg by mouth 2 (two) times daily. 11/05/16  Yes Josue Hector, MD  omeprazole (PRILOSEC) 40 MG capsule take 1 capsule by mouth once daily 01/13/17  Yes [name], Evie Lacks, MD  potassium chloride SA (K-DUR,KLOR-CON) 20 MEQ tablet Take 1 tablet (20 mEq total) by mouth daily. 08/16/16   Yes Rai, Ripudeep K, MD  Blood Glucose Monitoring Suppl (ONE TOUCH ULTRA 2) W/DEVICE KIT Use as directed Dx E11.9 05/18/14   [name], Evie Lacks, MD  Blood Pressure Monitor KIT Use to check blood pressure daily Dx I10 10/04/15   [name], Evie Lacks, MD  glucose blood (ONE TOUCH ULTRA TEST) test strip 1 each by Other route 4 (four) times daily. Use to check blood sugar four times a day  Dx: E11.9- insulin Dependant 04/06/16   [name], Evie Lacks, MD  HYDROcodone-acetaminophen (NORCO/VICODIN) 5-325 MG tablet Take 1 tablet by mouth 2 (two) times daily as needed for moderate pain. 08/28/16 08/28/17  [name], Evie Lacks, MD  Insulin Pen Needle 31G X 5 MM MISC 1 Units by Does not apply route 4 (four) times daily. 03/17/16   [name], Evie Lacks, MD  Lancets [facility] ULTRASOFT) lancets Use to check blood sugars daily 05/18/14   [name], Evie Lacks, MD    Family History Family History  Problem Relation Age of Onset  . Heart attack Father   . Lung cancer Father   . Diabetes Father   . Stroke Father   . Hypertension Father   . Stroke Mother   . Hypertension Mother   . Cancer Brother        liver  . Heart disease Brother        chf  . COPD Sister   . Malignant hyperthermia Neg Hx     Social History Social History  Substance Use Topics  . Smoking status: Former Smoker    Packs/day: 0.70    Years: 48.00    Types: Cigarettes    Quit date: 03/29/2006  . Smokeless tobacco: Never Used  . Alcohol use 0.6 oz/week    1 Cans of beer per week     Comment: occasional alcohol intake     Allergies   Percocet [oxycodone-acetaminophen]; Invokana [canagliflozin]; and Metformin and related   Review of Systems Review of Systems  All other systems reviewed and are negative.    Physical Exam Updated Vital Signs BP (!) 173/75 (BP Location: Left Arm)   Pulse (!) 54   Temp 97.6 F (36.4 C) (Oral)   Resp 16   SpO2 99%   Physical Exam  Constitutional: He is oriented to person, place, and  time. He appears well-developed and well-nourished.  Non-toxic appearance. No distress.  HENT:  Head: Normocephalic and atraumatic.  Eyes: Pupils are equal, round, and reactive to light. Conjunctivae, EOM and  lids are normal.  Neck: Normal range of motion. Neck supple. No tracheal deviation present. No thyroid mass present.  Cardiovascular: Normal rate, regular rhythm and normal heart sounds.  Exam reveals no gallop.   No murmur heard. Pulmonary/Chest: Effort normal. No stridor. No respiratory distress. He has no decreased breath sounds. He has no wheezes. He has rhonchi in the right lower field and the left lower field. He has no rales.  Abdominal: Soft. Normal appearance and bowel sounds are normal. He exhibits no distension. There is no tenderness. There is no rebound and no CVA tenderness.  Musculoskeletal: Normal range of motion. He exhibits no edema or tenderness.  Neurological: He is alert and oriented to person, place, and time. He has normal strength. No cranial nerve deficit or sensory deficit. GCS eye subscore is 4. GCS verbal subscore is 5. GCS motor subscore is 6.  Skin: Skin is warm and dry. No abrasion and no rash noted.  Psychiatric: He has a normal mood and affect. His speech is normal and behavior is normal.  Nursing note and vitals reviewed.    ED Treatments / Results  Labs (all labs ordered are listed, but only abnormal results are displayed) Labs Reviewed  BASIC METABOLIC PANEL - Abnormal; Notable for the following:       Result Value   Glucose, Bld 177 (*)    BUN 41 (*)    Creatinine, Ser 1.75 (*)    Calcium 8.7 (*)    GFR calc non Af Amer 37 (*)    GFR calc Af Amer 42 (*)    All other components within normal limits  CBC - Abnormal; Notable for the following:    RBC 3.30 (*)    Hemoglobin 9.8 (*)    HCT 29.1 (*)    RDW 15.9 (*)    Platelets 129 (*)    All other components within normal limits  BRAIN NATRIURETIC PEPTIDE - Abnormal; Notable for the following:     B Natriuretic Peptide 527.3 (*)    All other components within normal limits  I-STAT TROPOININ, ED  POCT I-STAT TROPONIN I    EKG  EKG Interpretation  Date/Time:  Friday February 05 2017 15:52:59 EDT Ventricular Rate:  53 PR Interval:    QRS Duration: 105 QT Interval:  466 QTC Calculation: 438 R Axis:   53 Text Interpretation:  Sinus rhythm Borderline repolarization abnormality T wave abnormality No significant change since last tracing Abnormal ekg Confirmed by Carmin Muskrat [phone]) on 02/05/2017 4:15:54 PM       Radiology Dg Chest 2 View  Result Date: 02/05/2017 CLINICAL DATA:  Shortness of breath EXAM: CHEST  2 VIEW COMPARISON:  12/23/2016 FINDINGS: Chronic cardiomegaly. Unremarkable aortic contours. Congested appearance of the hila. Diffuse interstitial opacities with Awanda Mink and small left pleural effusion. IMPRESSION: CHF pattern. Electronically Signed   By: Monte Fantasia M.D.   On: 02/05/2017 16:03    Procedures Procedures (including critical care time)  Medications Ordered in ED Medications  0.9 %  sodium chloride infusion (not administered)  furosemide (LASIX) injection 40 mg (not administered)     Initial Impression / Assessment and Plan / ED Course  I have reviewed the triage vital signs and the nursing notes.  Pertinent labs & imaging results that were available during my care of the patient were reviewed by me and considered in my medical decision making (see chart for details).     Patient treated IV Lasix here and will be admitted to the  medicine service  Final Clinical Impressions(s) / ED Diagnoses   Final diagnoses:  None    New Prescriptions New Prescriptions   No medications on file     Lacretia Leigh, MD 02/05/17 2013

## 2017-02-05 NOTE — Telephone Encounter (Signed)
Pt phone does not have voice mail set up will call later

## 2017-02-05 NOTE — H&P (Signed)
History and Physical    NUSSEN PULLIN WUJ:[id number] DOB: 10-13-1940 DOA: 02/05/2017  Referring MD/NP/PA:   PCP: Cassandria Anger, MD   Patient coming from:  The patient is coming from home.  At baseline, pt is independent for most of ADL.  Chief Complaint: Shortness of breath, cough, weight gain and leg edema  HPI: Michael [name] is a 76 y.o. male with medical history significant of hypertension, hyperlipidemia, diabetes mellitus, PAF not on anticoagulants, GERD, hypothyroidism, gout, and deficiency anemia, C. difficile colitis, CAD, CKD-III, AVM, dCHF, who presents with shortness of breath, cough, weight gain and leg edema.  Patient states that he has been having dry cough in the past 5 days, which has been progressively getting worse. He has nasal drainage, but no chest pain, fever or chills. Patient has shortness of breath and orthopnea with 3 pillows at night. His speaks in full sentence. He has approximated 5 pounds of body weight gain recently. His bilateral leg edema has been worsening. Patient does not have nausea, vomiting, diarrhea, abdominal pain, symptoms of UTI or unilateral weakness.  ED Course: pt was found to have BNP 527.3, negative troponin, WBC 4.5, stable renal function, temperature normal, no tachycardia, oxygen saturation 99% on room air, chest x-rays consistence with CHF. Patient is admitted to telemetry bed as inpatient.  Review of Systems:   General: no fevers, chills, has body weight gain, has poor appetite, has fatigue HEENT: no blurry vision, hearing changes or sore throat Respiratory: has dyspnea, coughing, no wheezing CV: no chest pain, no palpitations GI: no nausea, vomiting, abdominal pain, diarrhea, constipation GU: no dysuria, burning on urination, increased urinary frequency, hematuria  Ext: has leg edema Neuro: no unilateral weakness, numbness, or tingling, no vision change or hearing loss Skin: no skin tear. MSK: No muscle spasm, no deformity, no  limitation of range of movement in spin Heme: No easy bruising.  Travel history: No recent long distant travel.  Allergy:  Allergies  Allergen Reactions  . Percocet [Oxycodone-Acetaminophen] Nausea And Vomiting  . Invokana [Canagliflozin]     Side effects  . Metformin And Related     Upset stomach    Past Medical History:  Diagnosis Date  . Acute on chronic heart failure ([place])   . Allergy   . Arthritis   . AVM (arteriovenous malformation) of colon   . C. difficile enteritis   . CAD (coronary artery disease)    a. Cath 10/2014 - Mild to moderate diagonal, circumflex and obtuse marginal disease. Innominate artery sternosis.  . Carotid artery disease ([place])    a. Carotid dopple 03/2014 95-62% RICA &  13-08% LICA stenosis b. 6/57  . CKD (chronic kidney disease), stage III   . Clostridium difficile colitis 05/13/2016  . Dyspnea   . Dysrhythmia    ATRIAL FIBRILATION  . Elevated troponin    a. 09/2014 in setting of AF RVR-->Myoview: EF 49%, no ischemia/infarct->Med Rx.  . GERD (gastroesophageal reflux disease)   . Gout   . H/O transfusion of whole blood   . Heart murmur   . History of PFTs    PFTs 6/16:  FVC 3.73 (87%), FEV1 2.93 (88%), FEV1/FVC 78%, DLCO 69%  . Hx of adenomatous colonic polyps 07/02/2016  . Hyperlipidemia   . Hypertension   . Hypothyroidism   . Iron deficiency anemia   . PAF (paroxysmal atrial fibrillation) ([place])    a. 09/2014: Converted on Dilt;  b. CHA2DS2VASc = 3-->eliquis;  c. 09/2014 Echo: EF 55-60%, mild  LVH, mildly dil LA.  [name] Personal history of colonic polyps 2007, 2008   adenoma each time, largest 12 mm in 2007  . Psoriasis   . Staph infection 07/2016   left hand   . Staphylococcus aureus bacteremia 07/31/2016  . Type II diabetes mellitus ([place])    TYPE 2    Past Surgical History:  Procedure Laterality Date  . APPENDECTOMY  02/09/2014  . CARDIAC CATHETERIZATION N/A 07/29/2016   Procedure: Right/Left Heart Cath and Coronary Angiography;  Surgeon:  Nelva Bush, MD;  Location: [facility];  Service: Cardiovascular;  Laterality: N/A;  . CARDIAC CATHETERIZATION N/A 07/29/2016   Procedure: Coronary Stent Intervention;  Surgeon: Nelva Bush, MD;  Location: [facility];  Service: Cardiovascular;  Laterality: N/A;  . COLONOSCOPY  02/10/2011   internal hemorrhoids  . COLONOSCOPY W/ POLYPECTOMY  04/09/2006   12 mm adenoma  . COLONOSCOPY W/ POLYPECTOMY  06/17/2007   5 mm adenoma  . COLONOSCOPY WITH PROPOFOL N/A 05/13/2016   Procedure: COLONOSCOPY WITH PROPOFOL;  Surgeon: Manus Gunning, MD;  Location: [facility];  Service: Gastroenterology;  Laterality: N/A;  . CORONARY STENT PLACEMENT  07/29/2016   OMI    DES  . ENTEROSCOPY N/A 08/14/2016   Procedure: ENTEROSCOPY;  Surgeon: Manus Gunning, MD;  Location: [facility] ENDOSCOPY;  Service: Gastroenterology;  Laterality: N/A;  . ESOPHAGOGASTRODUODENOSCOPY  12/23/2011   Procedure: ESOPHAGOGASTRODUODENOSCOPY (EGD);  Surgeon: Irene Shipper, MD;  Location: [facility];  Service: Endoscopy;  Laterality: N/A;  with small bowel bx's  . GIVENS CAPSULE STUDY  12/28/2011  . GIVENS CAPSULE STUDY N/A 08/14/2016   Procedure: GIVENS CAPSULE STUDY;  Surgeon: Manus Gunning, MD;  Location: [facility];  Service: Gastroenterology;  Laterality: N/A;  . KNEE ARTHROSCOPY Right   . LAPAROSCOPIC APPENDECTOMY N/A 02/09/2014   Procedure: APPENDECTOMY LAPAROSCOPIC;  Surgeon: Leighton Ruff, MD;  Location: [facility];  Service: General;  Laterality: N/A;  . LEFT HEART CATHETERIZATION WITH CORONARY ANGIOGRAM N/A 11/15/2014   Procedure: LEFT HEART CATHETERIZATION WITH CORONARY ANGIOGRAM;  Surgeon: Belva Crome, MD;  Location: [facility];  Service: Cardiovascular;  Laterality: N/A;  . ROTATOR CUFF REPAIR     left    Social History:  reports that he quit smoking about 10 years ago. His smoking use included Cigarettes. He has a 33.60 pack-year smoking history. He has never used smokeless tobacco. He  reports that he drinks about 0.6 oz of alcohol per week . He reports that he does not use drugs.  Family History:  Family History  Problem Relation Age of Onset  . Heart attack Father   . Lung cancer Father   . Diabetes Father   . Stroke Father   . Hypertension Father   . Stroke Mother   . Hypertension Mother   . Cancer Brother        liver  . Heart disease Brother        chf  . COPD Sister   . Malignant hyperthermia Neg Hx      Prior to Admission medications   Medication Sig Start Date End Date Taking? Authorizing Provider  allopurinol (ZYLOPRIM) 100 MG tablet take 2 tablets by mouth daily 02/04/17  Yes [name], Evie Lacks, MD  amiodarone (PACERONE) 200 MG tablet TAKE 1 TABLET BY MOUTH DAILY 06/24/16  Yes Wellington Hampshire, MD  aspirin 81 MG chewable tablet Chew 1 tablet (81 mg total) by mouth daily. 08/05/16  Yes Velvet Bathe, MD  atorvastatin (LIPITOR)  40 MG tablet take 1 tablet by mouth once daily 01/05/17  Yes Josue Hector, MD  CARTIA XT 180 MG 24 hr capsule take 1 capsule by mouth once daily 11/30/16  Yes Wellington Hampshire, MD  Cholecalciferol 1000 UNITS TBDP Take 1,000-5,000 Units by mouth daily. Takes 1000 units everyday except for on Saturday patient takes 5000 units   Yes [provider]  clopidogrel (PLAVIX) 75 MG tablet Take 1 tablet (75 mg total) by mouth daily with breakfast. 10/16/16  Yes Josue Hector, MD  fenofibrate 160 MG tablet TAKE 1 TABLET(160 MG) BY MOUTH DAILY 08/07/16  Yes Wellington Hampshire, MD  ferrous sulfate 325 (65 FE) MG tablet Take 325 mg by mouth 3 (three) times daily with meals.   Yes [provider]  furosemide (LASIX) 20 MG tablet Take 2 tablets (40 mg total) by mouth daily. 01/01/17  Yes [name], Evie Lacks, MD  glipiZIDE (GLUCOTROL) 10 MG tablet Take 1 tablet (10 mg total) by mouth 2 (two) times daily before a meal. 10/01/16  Yes [name], Evie Lacks, MD  hydrALAZINE (APRESOLINE) 25 MG tablet Take 1 tablet (25 mg total) by mouth 3  (three) times daily. 02/04/17  Yes [name], Evie Lacks, MD  Insulin Glargine (TOUJEO SOLOSTAR) 300 UNIT/ML SOPN Inject 52 Units into the skin daily.   Yes [provider]  levothyroxine (SYNTHROID, LEVOTHROID) 150 MCG tablet TAKE 1 TABLET BY MOUTH DAILY BEFORE BREAKFAST 03/16/16  Yes [name], Evie Lacks, MD  Metoprolol Tartrate 75 MG TABS Take 75 mg by mouth 2 (two) times daily. 11/05/16  Yes Josue Hector, MD  omeprazole (PRILOSEC) 40 MG capsule take 1 capsule by mouth once daily 01/13/17  Yes [name], Evie Lacks, MD  potassium chloride SA (K-DUR,KLOR-CON) 20 MEQ tablet Take 1 tablet (20 mEq total) by mouth daily. 08/16/16  Yes Rai, Ripudeep K, MD  Blood Glucose Monitoring Suppl (ONE TOUCH ULTRA 2) W/DEVICE KIT Use as directed Dx E11.9 05/18/14   [name], Evie Lacks, MD  Blood Pressure Monitor KIT Use to check blood pressure daily Dx I10 10/04/15   [name], Evie Lacks, MD  glucose blood (ONE TOUCH ULTRA TEST) test strip 1 each by Other route 4 (four) times daily. Use to check blood sugar four times a day  Dx: E11.9- insulin Dependant 04/06/16   [name], Evie Lacks, MD  HYDROcodone-acetaminophen (NORCO/VICODIN) 5-325 MG tablet Take 1 tablet by mouth 2 (two) times daily as needed for moderate pain. 08/28/16 08/28/17  [name], Evie Lacks, MD  Insulin Pen Needle 31G X 5 MM MISC 1 Units by Does not apply route 4 (four) times daily. 03/17/16   [name], Evie Lacks, MD  Lancets [facility] ULTRASOFT) lancets Use to check blood sugars daily 05/18/14   [name], Evie Lacks, MD    Physical Exam: Vitals:   02/05/17 1545 02/05/17 2044  BP: (!) 173/75 (!) 161/60  Pulse: (!) 54 (!) 55  Resp: 16 20  Temp: 97.6 F (36.4 C) 98.1 F (36.7 C)  TempSrc: Oral Oral  SpO2: 99% 93%   General: Not in acute distress HEENT:       Eyes: PERRL, EOMI, no scleral icterus.       ENT: No discharge from the ears and nose, no pharynx injection, no tonsillar enlargement.        Neck: positive JVD, no bruit, no  mass felt. Heme: No neck lymph node enlargement. Cardiac: S1/S2, RRR, No murmurs, No gallops or rubs. Respiratory: No rales, wheezing, rhonchi or rubs. GI:  Soft, nondistended, nontender, no rebound pain, no organomegaly, BS present. GU: No hematuria Ext: 1+ pitting leg edema bilaterally. 2+DP/PT pulse bilaterally. Musculoskeletal: No joint deformities, No joint redness or warmth, no limitation of ROM in spin. Skin: No rashes.  Neuro: Alert, oriented X3, cranial nerves II-XII grossly intact, moves all extremities normally. Psych: Patient is not psychotic, no suicidal or hemocidal ideation.  Labs on Admission: I have personally reviewed following labs and imaging studies  CBC:  Recent Labs Lab 02/04/17 0831 02/05/17 1611  WBC 5.8 4.5  NEUTROABS 4.1  --   HGB 11.1* 9.8*  HCT 32.2* 29.1*  MCV 86.9 88.2  PLT 148.0* 863*   Basic Metabolic Panel:  Recent Labs Lab 02/05/17 1611  NA 139  K 4.8  CL 109  CO2 23  GLUCOSE 177*  BUN 41*  CREATININE 1.75*  CALCIUM 8.7*   GFR: CrCl cannot be calculated (Unknown ideal weight.). Liver Function Tests: No results for input(s): AST, ALT, ALKPHOS, BILITOT, PROT, ALBUMIN in the last 168 hours. No results for input(s): LIPASE, AMYLASE in the last 168 hours. No results for input(s): AMMONIA in the last 168 hours. Coagulation Profile: No results for input(s): INR, PROTIME in the last 168 hours. Cardiac Enzymes: No results for input(s): CKTOTAL, CKMB, CKMBINDEX, TROPONINI in the last 168 hours. BNP (last 3 results) No results for input(s): PROBNP in the last 8760 hours. HbA1C: No results for input(s): HGBA1C in the last 72 hours. CBG: No results for input(s): GLUCAP in the last 168 hours. Lipid Profile: No results for input(s): CHOL, HDL, LDLCALC, TRIG, CHOLHDL, LDLDIRECT in the last 72 hours. Thyroid Function Tests: No results for input(s): TSH, T4TOTAL, FREET4, T3FREE, THYROIDAB in the last 72 hours. Anemia Panel: No results for  input(s): VITAMINB12, FOLATE, FERRITIN, TIBC, IRON, RETICCTPCT in the last 72 hours. Urine analysis:    Component Value Date/Time   COLORURINE YELLOW 05/11/2016 1600   APPEARANCEUR CLEAR 05/11/2016 1600   LABSPEC 1.021 05/11/2016 1600   PHURINE 5.5 05/11/2016 1600   GLUCOSEU NEGATIVE 05/11/2016 1600   GLUCOSEU NEGATIVE 11/07/2013 0754   HGBUR NEGATIVE 05/11/2016 1600   BILIRUBINUR NEGATIVE 05/11/2016 1600   BILIRUBINUR neg 03/08/2015 1105   KETONESUR NEGATIVE 05/11/2016 1600   PROTEINUR NEGATIVE 05/11/2016 1600   UROBILINOGEN negative 03/08/2015 1105   UROBILINOGEN 0.2 02/09/2014 1805   NITRITE NEGATIVE 05/11/2016 1600   LEUKOCYTESUR NEGATIVE 05/11/2016 1600   Sepsis Labs: @LABRCNTIP (procalcitonin:4,lacticidven:4) )No results found for this or any previous visit (from the past 240 hour(s)).   Radiological Exams on Admission: Dg Chest 2 View  Result Date: 02/05/2017 CLINICAL DATA:  Shortness of breath EXAM: CHEST  2 VIEW COMPARISON:  12/23/2016 FINDINGS: Chronic cardiomegaly. Unremarkable aortic contours. Congested appearance of the hila. Diffuse interstitial opacities with Awanda Mink and small left pleural effusion. IMPRESSION: CHF pattern. Electronically Signed   By: Monte Fantasia M.D.   On: 02/05/2017 16:03     EKG: Independently reviewed.  Sinus rhythm, QTC 438, anteroseptal infarction pattern, nonspecific T-wave change.  Assessment/Plan Principal Problem:   Acute on chronic diastolic (congestive) heart failure (HCC) Active Problems:   DM (diabetes mellitus), type 2 with peripheral vascular complications (HCC)   Iron deficiency anemia due to chronic blood loss   Gout   Essential hypertension   Hypothyroidism   Coronary artery disease involving native coronary artery of native heart without angina pectoris   GERD (gastroesophageal reflux disease)   PAF (paroxysmal atrial fibrillation) (HCC)   CKD (chronic kidney disease), stage III  Acute on chronic diastolic  (congestive) heart failure [facility]): Patient has elevated BNP, shortness breath, orthopnea, weight gain, bilateral leg edema, positive JVD, and CXR findings of pulmonary edema, consistent with CHF exacerbation. 2D echo on 12/23/16 showed EF 60-65% with grade 2 diastolic dysfunction.  -will admit to tele bed as inpt. -Lasix 40 mg bid by IV -trop x 3 -2d echo -will continue home metoprolol, ASA -Daily weights -strict I/O's -Low salt diet -Flonase for nasal drainage  PAF: CHA2DS2-VASc Score is 5, needs oral anticoagulation, but patient is not on AC, possibly due to history of AVM (high risk of bleeding). Heart rate is well controlled. -continue amiodarone and metoprolol  DM-II: Last A1c 5.4 on 01/19/17, well controled. Patient is taking glipizide and glargine insulin at home -will decrease glargine insulin dose from 52->38 units daily   -SSI  Iron deficiency anemia due to chronic blood loss: Slight drop of hemoglobin from 11.1 on 02/04/17-->9.8 today. -continue iron supplement -Follow-up CBC  Gout: stable -continue home allopurinol   HTN: -continue metoprolol, hydralazine -On IV Lasix -IVF hydralazine when necessary  Hypothyroidism: Last TSH was 4.452 on 07/27/16 -Continue home Synthroid  CAD: no CP -continue ASA, metoprolol, Lipitor and fenofibrate  GERD: -Protonix  HLD: -Lipitor and fenofibrate  CKD (chronic kidney disease), stage III: Stable. Baseline creatinine 1.8. His creatinine is 1.75 on admission. -Follow-up renal function by map   DVT ppx: SQ Lovenox Code Status: Full code Family Communication:  Yes, patient's wife at bed side Disposition Plan:  Anticipate discharge back to previous home environment Consults called:  none Admission status: Inpatient/tele     Date of Service 02/05/2017    Ivor Costa [facility] Pager [phone]  If 7PM-7AM, please contact night-coverage [email or web address] Password [facility] 02/05/2017, 8:56 PM

## 2017-02-06 ENCOUNTER — Other Ambulatory Visit: Payer: Self-pay

## 2017-02-06 ENCOUNTER — Inpatient Hospital Stay (HOSPITAL_COMMUNITY): Payer: Medicare Other

## 2017-02-06 DIAGNOSIS — I10 Essential (primary) hypertension: Secondary | ICD-10-CM

## 2017-02-06 DIAGNOSIS — I340 Nonrheumatic mitral (valve) insufficiency: Secondary | ICD-10-CM

## 2017-02-06 DIAGNOSIS — E1151 Type 2 diabetes mellitus with diabetic peripheral angiopathy without gangrene: Secondary | ICD-10-CM

## 2017-02-06 DIAGNOSIS — I5033 Acute on chronic diastolic (congestive) heart failure: Secondary | ICD-10-CM

## 2017-02-06 DIAGNOSIS — D500 Iron deficiency anemia secondary to blood loss (chronic): Secondary | ICD-10-CM

## 2017-02-06 DIAGNOSIS — I480 Paroxysmal atrial fibrillation: Secondary | ICD-10-CM

## 2017-02-06 DIAGNOSIS — N183 Chronic kidney disease, stage 3 (moderate): Secondary | ICD-10-CM

## 2017-02-06 LAB — BASIC METABOLIC PANEL
Anion gap: 6 (ref 5–15)
BUN: 37 mg/dL — AB (ref 6–20)
CALCIUM: 8.6 mg/dL — AB (ref 8.9–10.3)
CHLORIDE: 108 mmol/L (ref 101–111)
CO2: 26 mmol/L (ref 22–32)
CREATININE: 1.78 mg/dL — AB (ref 0.61–1.24)
GFR calc non Af Amer: 36 mL/min — ABNORMAL LOW (ref >60)
GFR, EST AFRICAN AMERICAN: 42 mL/min — AB (ref >60)
Glucose, Bld: 125 mg/dL — ABNORMAL HIGH (ref 65–99)
Potassium: 4.5 mmol/L (ref 3.5–5.1)
SODIUM: 140 mmol/L (ref 135–145)

## 2017-02-06 LAB — TROPONIN I

## 2017-02-06 LAB — ECHOCARDIOGRAM COMPLETE
Height: 71 in
WEIGHTICAEL: 3985.92 [oz_av]

## 2017-02-06 LAB — GLUCOSE, CAPILLARY
GLUCOSE-CAPILLARY: 157 mg/dL — AB (ref 65–99)
Glucose-Capillary: 148 mg/dL — ABNORMAL HIGH (ref 65–99)
Glucose-Capillary: 104 mg/dL — ABNORMAL HIGH (ref 65–99)
Glucose-Capillary: 149 mg/dL — ABNORMAL HIGH (ref 65–99)

## 2017-02-06 NOTE — Progress Notes (Signed)
  Echocardiogram 2D Echocardiogram has been performed.  Bobbye Charleston 02/06/2017, 9:55 AM

## 2017-02-06 NOTE — Progress Notes (Signed)
PROGRESS NOTE [facility]   KAIEL WEIDE   DTO:[id number] DOB: 1941-05-02  DOA: 02/05/2017 PCP: Cassandria Anger, MD   Brief Narrative:  76 year old male with a medical history significant for hypertension, hyperlipidemia, diabetes mellitus, A. fib, hypothyroidism, chronic kidney disease stage III and diastolic dysfunction presented to the emergency department with shortness of breath, cough, weight gain and leg edema. Upon ED evaluation patient was found to have a BNP 527, which is x-ray consistent with CHF. Patient was admitted IV Lasix and further monitoring.  Subjective: Patient seen and examined, shortness of breath has improved although not at baseline. Cough has resolved. Lower extremity edema significantly improved. Patient with good urine output.  Assessment & Plan: Acute on chronic diastolic heart failure Clinical finding consistent with diagnosis elevated BNP, shortness of breath, orthopnea, bilateral leg edema and positive JVD with chest x-ray consistent with pulmonary edema. Echocardiogram done today shows EF of 60-65% with grade 2 diastolic dysfunction, no motion abnormality, increasing pulmonary artery peak pressure. - Unchanged from echo on May 2018. Continue IV Lasix for now if stable in AM will d/c on oral Lasix and follow up with cardiology  Hold metoprolol for now  Daily weight and strict I&O's  Trops negative   PAF  CHADSVASC score 5  Not on AC due to history of AVM  On metoprolol and amiodarone - holding meds as patient HR was around 40's  EKG show 1st degree AV block  Monitor on tele   DM II - stable  Last A1C 5.4 on 01/21/17 Continue current regimen   HTN - BP stable  Continue to monitor  May need to adjust BP medications as patient is bradycardic   Iron def/chronic disease anemia No signs of overt bleeding Monitor CBC in AM    CKD stage III  Cr at baseline  Monitor while patient IV Lasix   DVT prophylaxis: Lovenox  Code Status: FULL    Family Communication: Wife at bedside  Disposition Plan: Home in AM if remains stable   Consultants:   None   Procedures:   ECHO 02/06/17  Antimicrobials: Anti-infectives    None       Objective: Vitals:   02/05/17 2329 02/06/17 0540 02/06/17 1001 02/06/17 1300  BP:  (!) 157/87 (!) 151/54 (!) 152/53  Pulse: 62 (!) 49 (!) 53 (!) 57  Resp:  18  18  Temp:  97.9 F (36.6 C)  98.3 F (36.8 C)  TempSrc:  Oral  Oral  SpO2:  100%  99%  Weight:  113 kg (249 lb 1.9 oz)    Height:        Intake/Output Summary (Last 24 hours) at 02/06/17 1743 Last data filed at 02/06/17 1629  Gross per 24 hour  Intake              894 ml  Output             2350 ml  Net            -1456 ml   Filed Weights   02/05/17 2200 02/06/17 0540  Weight: 113 kg (249 lb 1.6 oz) 113 kg (249 lb 1.9 oz)    Examination:  General exam: Appears calm and comfortable  HEENT: AC/AT, PERRLA, OP moist and clear Respiratory system: Mild decrease in air entry bibasilar, scattered rales, no wheezing  Cardiovascular system: S1 & S2 heard, RRR. No JVD noted ,2/6 SM. No rubs or gallops Gastrointestinal system: Abdomen is nondistended, soft and nontender. No  organomegaly or masses felt. Normal bowel sounds heard. Central nervous system: Alert and oriented. No focal neurological deficits. Extremities: trace LE edema b/l. Skin: No rashes, lesions or ulcers Psychiatry: Judgement and insight appear normal. Mood & affect appropriate.    Data Reviewed: I have personally reviewed following labs and imaging studies  CBC:  Recent Labs Lab 02/04/17 0831 02/05/17 1611  WBC 5.8 4.5  NEUTROABS 4.1  --   HGB 11.1* 9.8*  HCT 32.2* 29.1*  MCV 86.9 88.2  PLT 148.0* 440*   Basic Metabolic Panel:  Recent Labs Lab 02/05/17 1611 02/06/17 0229  NA 139 140  K 4.8 4.5  CL 109 108  CO2 23 26  GLUCOSE 177* 125*  BUN 41* 37*  CREATININE 1.75* 1.78*  CALCIUM 8.7* 8.6*   GFR: Estimated Creatinine Clearance: 46.6  mL/min (A) (by C-G formula based on SCr of 1.78 mg/dL (H)). Liver Function Tests: No results for input(s): AST, ALT, ALKPHOS, BILITOT, PROT, ALBUMIN in the last 168 hours. No results for input(s): LIPASE, AMYLASE in the last 168 hours. No results for input(s): AMMONIA in the last 168 hours. Coagulation Profile: No results for input(s): INR, PROTIME in the last 168 hours. Cardiac Enzymes:  Recent Labs Lab 02/05/17 2253 02/06/17 0229 02/06/17 1020  TROPONINI <0.03 <0.03 <0.03   BNP (last 3 results) No results for input(s): PROBNP in the last 8760 hours. HbA1C: No results for input(s): HGBA1C in the last 72 hours. CBG:  Recent Labs Lab 02/05/17 2305 02/06/17 0756 02/06/17 1126 02/06/17 1653  GLUCAP 90 148* 157* 104*   Lipid Profile: No results for input(s): CHOL, HDL, LDLCALC, TRIG, CHOLHDL, LDLDIRECT in the last 72 hours. Thyroid Function Tests: No results for input(s): TSH, T4TOTAL, FREET4, T3FREE, THYROIDAB in the last 72 hours. Anemia Panel: No results for input(s): VITAMINB12, FOLATE, FERRITIN, TIBC, IRON, RETICCTPCT in the last 72 hours. Sepsis Labs: No results for input(s): PROCALCITON, LATICACIDVEN in the last 168 hours.  No results found for this or any previous visit (from the past 240 hour(s)).     Radiology Studies: Dg Chest 2 View  Result Date: 02/05/2017 CLINICAL DATA:  Shortness of breath EXAM: CHEST  2 VIEW COMPARISON:  12/23/2016 FINDINGS: Chronic cardiomegaly. Unremarkable aortic contours. Congested appearance of the hila. Diffuse interstitial opacities with Awanda Mink and small left pleural effusion. IMPRESSION: CHF pattern. Electronically Signed   By: Monte Fantasia M.D.   On: 02/05/2017 16:03    Scheduled Meds: . allopurinol  200 mg Oral Daily  . amiodarone  200 mg Oral Daily  . aspirin  81 mg Oral Daily  . atorvastatin  40 mg Oral q1800  . [START ON 02/07/2017] cholecalciferol  1,000 Units Oral Once per day on Sun Mon Tue Wed Thu Fri  .  cholecalciferol  5,000 Units Oral Once per day on Sat  . clopidogrel  75 mg Oral Q breakfast  . diltiazem  180 mg Oral Daily  . fenofibrate  160 mg Oral Daily  . ferrous sulfate  325 mg Oral TID WC  . fluticasone  2 spray Each Nare Daily  . furosemide  40 mg Intravenous BID  . hydrALAZINE  25 mg Oral TID  . insulin aspart  0-9 Units Subcutaneous TID WC  . insulin glargine  40 Units Subcutaneous Daily  . levothyroxine  150 mcg Oral QAC breakfast  . metoprolol tartrate  75 mg Oral BID  . pantoprazole  40 mg Oral Daily  . sodium chloride flush  3 mL Intravenous Q12H  Continuous Infusions: . sodium chloride 20 mL/hr at 02/05/17 2140  . sodium chloride       LOS: 1 day    Time spent: Total of 25 minutes spent with pt, greater than 50% of which was spent in discussion of  treatment, counseling and coordination of care   Chipper Oman, MD Pager: Text Page via [email or web address]  [phone]  If 7PM-7AM, please contact night-coverage [email or web address] Password [facility] 02/06/2017, 5:43 PM

## 2017-02-07 LAB — CBC WITH DIFFERENTIAL/PLATELET
BASOS ABS: 0 10*3/uL (ref 0.0–0.1)
Basophils Relative: 0 %
EOS PCT: 2 %
Eosinophils Absolute: 0.1 10*3/uL (ref 0.0–0.7)
HEMATOCRIT: 30.6 % — AB (ref 39.0–52.0)
Hemoglobin: 10.3 g/dL — ABNORMAL LOW (ref 13.0–17.0)
LYMPHS PCT: 13 %
Lymphs Abs: 0.5 10*3/uL — ABNORMAL LOW (ref 0.7–4.0)
MCH: 29.6 pg (ref 26.0–34.0)
MCHC: 33.7 g/dL (ref 30.0–36.0)
MCV: 87.9 fL (ref 78.0–100.0)
MONO ABS: 0.3 10*3/uL (ref 0.1–1.0)
MONOS PCT: 9 %
Neutro Abs: 2.6 10*3/uL (ref 1.7–7.7)
Neutrophils Relative %: 75 %
PLATELETS: 108 10*3/uL — AB (ref 150–400)
RBC: 3.48 MIL/uL — ABNORMAL LOW (ref 4.22–5.81)
RDW: 15.8 % — AB (ref 11.5–15.5)
WBC: 3.5 10*3/uL — ABNORMAL LOW (ref 4.0–10.5)

## 2017-02-07 LAB — BASIC METABOLIC PANEL
Anion gap: 6 (ref 5–15)
BUN: 32 mg/dL — AB (ref 6–20)
CO2: 29 mmol/L (ref 22–32)
Calcium: 8.9 mg/dL (ref 8.9–10.3)
Chloride: 104 mmol/L (ref 101–111)
Creatinine, Ser: 2.04 mg/dL — ABNORMAL HIGH (ref 0.61–1.24)
GFR calc Af Amer: 35 mL/min — ABNORMAL LOW (ref >60)
GFR, EST NON AFRICAN AMERICAN: 30 mL/min — AB (ref >60)
GLUCOSE: 143 mg/dL — AB (ref 65–99)
POTASSIUM: 4.4 mmol/L (ref 3.5–5.1)
Sodium: 139 mmol/L (ref 135–145)

## 2017-02-07 LAB — GLUCOSE, CAPILLARY: GLUCOSE-CAPILLARY: 143 mg/dL — AB (ref 65–99)

## 2017-02-07 LAB — BRAIN NATRIURETIC PEPTIDE: B Natriuretic Peptide: 351.5 pg/mL — ABNORMAL HIGH (ref 0.0–100.0)

## 2017-02-07 NOTE — Progress Notes (Signed)
Reviewed discharge information with patient and caregiver. Answered all questions. Patient/caregiver able to teach back medications and reasons to contact MD/911. Patient verbalizes importance of PCP follow up appointment. Extensive education with teach back on low sodium diet and heart failure packet.  Barbee Shropshire. Brigitte Pulse, RN

## 2017-02-07 NOTE — Discharge Summary (Signed)
Physician Discharge Summary  Michael [name]  ZOX:[id number]  DOB: [birth year]  DOA: 02/05/2017 PCP: Cassandria Anger, MD  Admit date: 02/05/2017 Discharge date: 02/07/2017  Admitted From: Home  Disposition:  Home   Recommendations for Outpatient Follow-up:  1. Follow up with PCP in 3-5 days 2. Please obtain BMP/CBC in one week to monitor Hgb and kidney function  3. Please follow up with cardiology in 1-2 weeks to evaluate increase in pulm artery pressures   Discharge Condition: Stable   CODE STATUS: FULL  Diet recommendation: Heart Healthy / Carb Modified / Regular / Dysphagia   Brief/Interim Summary: 76 year old male with a medical history significant for hypertension, hyperlipidemia, diabetes mellitus, A. fib, hypothyroidism, chronic kidney disease stage III and diastolic dysfunction presented to the emergency department with shortness of breath, cough, weight gain and leg edema. Upon ED evaluation patient was found to have a BNP 527, with a chest x-ray consistent with CHF. Patient was admitted IV Lasix and further monitoring.  Subjective: Patient seen and examined on the day of discharge. Patient has no complaints today. Denies CP. SOB, palpitations, dizziness and cough. Patient feeling back to baseline and wants to go home. Patient afebrile, tolerating diet well and with good UOP.   Discharge Diagnoses/Hospital Course:  Acute on chronic diastolic heart failure Clinical finding consistent with diagnosis elevated BNP, shortness of breath, orthopnea, bilateral leg edema and positive JVD with chest x-ray consistent with pulmonary edema. Echocardiogram done today shows EF of 60-65% with grade 2 diastolic dysfunction, no motion abnormality, increasing pulmonary artery peak pressure. - Unchanged from echo on May 2018. Trops negative  EKG no ischemic changes  Will hold lasix for 24 hrs as there is evidence of over diureses, can resume Lasix on 02/08/17 at his normal home dose. BNP  decreased from admission  I&O's Net - 3L  Patient is instructed to follow up with PCP in 3-5 days  PAF -  HR was sinus brady during hospital stay - he report HR ranging 50-60 at home  CHADSVASC score 5  Not on AC due to history of AVM  Given EKG show 1st degree AV block, will discharge patient on Metoprolol and Cardizem only  Will hold Amiodarone until patient is seen by cardiology. If HR increase > 110 patient advised to resume Amio.  Tele only showed sinus brady during hospital stay  Follow up with cardiology   DM II - stable  Last A1C 5.4 on 01/21/17 No changes in medications were made   HTN - BP stable  Will continue Metoprolol, hydralazine and Cardizem  Patient also on Lasix  Follow up with PCP.   Iron def/chronic disease anemia No signs of overt bleeding Hgb stable  Monitor as outpatient   CKD stage III  Cr on admission was at baseline, although due to fluid restriction and aggressive IV diuresis Cr has a mild increase on the day of discharge. Patient was instructed to have good oral hydration and hold lasix for 24. Patient is to follow up with PCP to check BMP within 3-5 days.   All other chronic medical condition were stable during the hospitalization On the day of the discharge the patient's vitals were stable, and no other acute medical condition were reported by patient. Patient was felt safe to be discharge to home  Discharge Instructions  You were cared for by a hospitalist during your hospital stay. If you have any questions about your discharge medications or the care you received while you were in  the hospital after you are discharged, you can call the unit and asked to speak with the hospitalist on call if the hospitalist that took care of you is not available. Once you are discharged, your primary care physician will handle any further medical issues. Please note that NO REFILLS for any discharge medications will be authorized once you are discharged, as it is  imperative that you return to your primary care physician (or establish a relationship with a primary care physician if you do not have one) for your aftercare needs so that they can reassess your need for medications and monitor your lab values.  Discharge Instructions    (HEART FAILURE PATIENTS) Call MD:  Anytime you have any of the following symptoms: 1) 3 pound weight gain in 24 hours or 5 pounds in 1 week 2) shortness of breath, with or without a dry hacking cough 3) swelling in the hands, feet or stomach 4) if you have to sleep on extra pillows at night in order to breathe.    Complete by:  As directed    Call MD for:  difficulty breathing, headache or visual disturbances    Complete by:  As directed    Call MD for:  extreme fatigue    Complete by:  As directed    Call MD for:  hives    Complete by:  As directed    Call MD for:  persistant dizziness or light-headedness    Complete by:  As directed    Call MD for:  persistant nausea and vomiting    Complete by:  As directed    Call MD for:  redness, tenderness, or signs of infection (pain, swelling, redness, odor or green/yellow discharge around incision site)    Complete by:  As directed    Call MD for:  severe uncontrolled pain    Complete by:  As directed    Call MD for:  temperature >100.4    Complete by:  As directed    Diet - low sodium heart healthy    Complete by:  As directed    Discharge instructions    Complete by:  As directed    Hold Lasix for 24 hrs, can resume medication on 02/08/17 Stop Amiodarone until seen by PCP or Cardiology   Increase activity slowly    Complete by:  As directed      Allergies as of 02/07/2017      Reactions   Percocet [oxycodone-acetaminophen] Nausea And Vomiting   Invokana [canagliflozin]    Side effects   Metformin And Related    Upset stomach      Medication List    STOP taking these medications   amiodarone 200 MG tablet Commonly known as:  PACERONE     TAKE these  medications   allopurinol 100 MG tablet Commonly known as:  ZYLOPRIM take 2 tablets by mouth daily   aspirin 81 MG chewable tablet Chew 1 tablet (81 mg total) by mouth daily.   atorvastatin 40 MG tablet Commonly known as:  LIPITOR take 1 tablet by mouth once daily   Blood Pressure Monitor Kit Use to check blood pressure daily Dx I10   CARTIA XT 180 MG 24 hr capsule Generic drug:  diltiazem take 1 capsule by mouth once daily   Cholecalciferol 1000 units Tbdp Take 1,000-5,000 Units by mouth daily. Takes 1000 units everyday except for on Saturday patient takes 5000 units   clopidogrel 75 MG tablet Commonly known as:  PLAVIX Take  1 tablet (75 mg total) by mouth daily with breakfast.   fenofibrate 160 MG tablet TAKE 1 TABLET(160 MG) BY MOUTH DAILY   ferrous sulfate 325 (65 FE) MG tablet Take 325 mg by mouth 3 (three) times daily with meals.   furosemide 20 MG tablet Commonly known as:  LASIX Take 2 tablets (40 mg total) by mouth daily.   glipiZIDE 10 MG tablet Commonly known as:  GLUCOTROL Take 1 tablet (10 mg total) by mouth 2 (two) times daily before a meal.   glucose blood test strip Commonly known as:  ONE TOUCH ULTRA TEST 1 each by Other route 4 (four) times daily. Use to check blood sugar four times a day  Dx: E11.9- insulin Dependant   hydrALAZINE 25 MG tablet Commonly known as:  APRESOLINE Take 1 tablet (25 mg total) by mouth 3 (three) times daily.   HYDROcodone-acetaminophen 5-325 MG tablet Commonly known as:  NORCO/VICODIN Take 1 tablet by mouth 2 (two) times daily as needed for moderate pain.   Insulin Pen Needle 31G X 5 MM Misc 1 Units by Does not apply route 4 (four) times daily.   levothyroxine 150 MCG tablet Commonly known as:  SYNTHROID, LEVOTHROID TAKE 1 TABLET BY MOUTH DAILY BEFORE BREAKFAST   Metoprolol Tartrate 75 MG Tabs Take 75 mg by mouth 2 (two) times daily.   omeprazole 40 MG capsule Commonly known as:  PRILOSEC take 1 capsule by  mouth once daily   ONE TOUCH ULTRA 2 w/Device Kit Use as directed Dx E11.9   onetouch ultrasoft lancets Use to check blood sugars daily   potassium chloride SA 20 MEQ tablet Commonly known as:  K-DUR,KLOR-CON Take 1 tablet (20 mEq total) by mouth daily.   TOUJEO SOLOSTAR 300 UNIT/ML Sopn Generic drug:  Insulin Glargine Inject 52 Units into the skin daily.      Follow-up Information    [name], Evie Lacks, MD. Schedule an appointment as soon as possible for a visit in 3 day(s).   Specialty:  Internal Medicine Why:  hospital follow up  Contact information: [address] [place] [address] [phone]          Allergies  Allergen Reactions  . Percocet [Oxycodone-Acetaminophen] Nausea And Vomiting  . Invokana [Canagliflozin]     Side effects  . Metformin And Related     Upset stomach    Consultations:  None    Procedures/Studies: Dg Chest 2 View  Result Date: 02/05/2017 CLINICAL DATA:  Shortness of breath EXAM: CHEST  2 VIEW COMPARISON:  12/23/2016 FINDINGS: Chronic cardiomegaly. Unremarkable aortic contours. Congested appearance of the hila. Diffuse interstitial opacities with Awanda Mink and small left pleural effusion. IMPRESSION: CHF pattern. Electronically Signed   By: Monte Fantasia M.D.   On: 02/05/2017 16:03   ECHO 02/06/17 ------------------------------------------------------------------- Study Conclusions  - Left ventricle: The cavity size was normal. There was moderate   concentric hypertrophy. Systolic function was normal. The   estimated ejection fraction was in the range of 60% to 65%. Wall   motion was normal; there were no regional wall motion   abnormalities. Features are consistent with a pseudonormal left   ventricular filling pattern, with concomitant abnormal relaxation   and increased filling pressure (grade 2 diastolic dysfunction). - Aortic valve: Valve mobility was restricted. There was moderate   stenosis. Peak velocity (S): 358  cm/s. Mean gradient (S): 31 mm   Hg. Valve area (Vmean): 1.86 cm^2. - Aorta: Ascending aortic diameter: 38 mm (S). - Mitral valve:  There was moderate regurgitation. - Left atrium: The atrium was moderately dilated. - Pulmonary arteries: Systolic pressure was severely increased. PA   peak pressure: 67 mm Hg (S).  Impressions:  - Unchanged from prior echocardiogram (although pulmonary pressures   were not estimated on prior study   Discharge Exam: Vitals:   02/06/17 2018 02/07/17 0507  BP: (!) 157/61 (!) 149/78  Pulse: 61 (!) 55  Resp: 19 18  Temp: 98.2 F (36.8 C) 98.2 F (36.8 C)   Vitals:   02/06/17 1001 02/06/17 1300 02/06/17 2018 02/07/17 0507  BP: (!) 151/54 (!) 152/53 (!) 157/61 (!) 149/78  Pulse: (!) 53 (!) 57 61 (!) 55  Resp:  _0 Temp:  98.3 F (36.8 C) 98.2 F (36.8 C) 98.2 F (36.8 C)  TempSrc:  Oral Oral Oral  SpO2:  99% 100% 98%  Weight:    112.7 kg (248 lb 7.3 oz)  Height:        General: Pt is alert, awake, not in acute distress Cardiovascular: RRR, S1/S2 +, no rubs, no gallops Respiratory: CTA bilaterally, no wheezing, no rhonchi Abdominal: Soft, NT, ND, bowel sounds + Extremities: no edema, no cyanosis   The results of significant diagnostics from this hospitalization (including imaging, microbiology, ancillary and laboratory) are listed below for reference.     Microbiology: No results found for this or any previous visit (from the past 240 hour(s)).   Labs: BNP (last 3 results)  Recent Labs  12/22/16 2353 02/05/17 1611 02/07/17 0449  BNP 359.4* 527.3* 130.8*   Basic Metabolic Panel:  Recent Labs Lab 02/05/17 1611 02/06/17 0229 02/07/17 0449  NA 139 140 139  K 4.8 4.5 4.4  CL 109 108 104  CO2 _1 GLUCOSE 177* 125* 143*  BUN 41* 37* 32*  CREATININE 1.75* 1.78* 2.04*  CALCIUM 8.7* 8.6* 8.9   Liver Function Tests: No results for input(s): AST, ALT, ALKPHOS, BILITOT, PROT, ALBUMIN in the last 168 hours. No  results for input(s): LIPASE, AMYLASE in the last 168 hours. No results for input(s): AMMONIA in the last 168 hours. CBC:  Recent Labs Lab 02/04/17 0831 02/05/17 1611 02/07/17 0449  WBC 5.8 4.5 3.5*  NEUTROABS 4.1  --  2.6  HGB 11.1* 9.8* 10.3*  HCT 32.2* 29.1* 30.6*  MCV 86.9 88.2 87.9  PLT 148.0* 129* 108*   Cardiac Enzymes:  Recent Labs Lab 02/05/17 2253 02/06/17 0229 02/06/17 1020  TROPONINI <0.03 <0.03 <0.03   BNP: Invalid input(s): POCBNP CBG:  Recent Labs Lab 02/06/17 0756 02/06/17 1126 02/06/17 1653 02/06/17 2042 02/07/17 0723  GLUCAP 148* 157* 104* 149* 143*   D-Dimer No results for input(s): DDIMER in the last 72 hours. Hgb A1c No results for input(s): HGBA1C in the last 72 hours. Lipid Profile No results for input(s): CHOL, HDL, LDLCALC, TRIG, CHOLHDL, LDLDIRECT in the last 72 hours. Thyroid function studies No results for input(s): TSH, T4TOTAL, T3FREE, THYROIDAB in the last 72 hours.  Invalid input(s): FREET3 Anemia work up No results for input(s): VITAMINB12, FOLATE, FERRITIN, TIBC, IRON, RETICCTPCT in the last 72 hours. Urinalysis    Component Value Date/Time   COLORURINE YELLOW 05/11/2016 1600   APPEARANCEUR CLEAR 05/11/2016 1600   LABSPEC 1.021 05/11/2016 1600   PHURINE 5.5 05/11/2016 1600   GLUCOSEU NEGATIVE 05/11/2016 1600   GLUCOSEU NEGATIVE 11/07/2013 0754   HGBUR NEGATIVE 05/11/2016 1600   BILIRUBINUR NEGATIVE 05/11/2016 1600   BILIRUBINUR neg 03/08/2015 [place] 05/11/2016  [place] 05/11/2016 1600   UROBILINOGEN negative 03/08/2015 1105   UROBILINOGEN 0.2 02/09/2014 1805   NITRITE NEGATIVE 05/11/2016 1600   LEUKOCYTESUR NEGATIVE 05/11/2016 1600   Sepsis Labs Invalid input(s): PROCALCITONIN,  WBC,  LACTICIDVEN Microbiology No results found for this or any previous visit (from the past 240 hour(s)).  Time coordinating discharge: 32 minutes  SIGNED:  Chipper Oman, MD  [facility] 02/07/2017, 9:54 AM  Pager please text page via  [email or web address] Password [email or web address]

## 2017-02-08 ENCOUNTER — Telehealth: Payer: Self-pay | Admitting: *Deleted

## 2017-02-08 NOTE — Telephone Encounter (Signed)
Patient was hospitalized over the weekend, was discharged yesterday.  Please review CBC from 02/04/17 and advise if needs repeat labs

## 2017-02-08 NOTE — Telephone Encounter (Signed)
Called pt to verify hosp f/u appt that was made for 02/10/17 spoke w/wife which she confirm that she had made appt this am. Inform h her we had some additional question to ask concerning hosp stay. Completed TCM call below.../lmb  Transition Care Management Follow-up Telephone Call   Date discharged? 02/07/17   How have you been since you were released from the hospital? Wife states husband is doing alright   Do you understand why you were in the hospital? YES   Do you understand the discharge instructions? YES   Where were you discharged to? Home   Items Reviewed:  Medications reviewed: YES  Allergies reviewed: YES  Dietary changes reviewed: YES- diabetic  Referrals reviewed: NO   Functional Questionnaire:   Activities of Daily Living (ADLs):   She states he are independent in the following: ambulation, bathing and hygiene, feeding, continence, grooming, toileting and dressing States he require assistance with the following: ambulation   Any transportation issues/concerns?: NO   Any patient concerns? NO   Confirmed importance and date/time of follow-up visits scheduled YES, appt 02/10/17  Provider Appointment booked with Dr. Dairl Ponder  Confirmed with patient if condition begins to worsen call PCP or go to the ER.  Patient was given the office number and encouraged to call back with question or concerns.  : YES

## 2017-02-10 ENCOUNTER — Ambulatory Visit (INDEPENDENT_AMBULATORY_CARE_PROVIDER_SITE_OTHER): Payer: Medicare Other | Admitting: Internal Medicine

## 2017-02-10 ENCOUNTER — Other Ambulatory Visit (INDEPENDENT_AMBULATORY_CARE_PROVIDER_SITE_OTHER): Payer: Medicare Other

## 2017-02-10 ENCOUNTER — Encounter: Payer: Self-pay | Admitting: Internal Medicine

## 2017-02-10 DIAGNOSIS — Z8719 Personal history of other diseases of the digestive system: Secondary | ICD-10-CM

## 2017-02-10 DIAGNOSIS — N318 Other neuromuscular dysfunction of bladder: Secondary | ICD-10-CM | POA: Diagnosis not present

## 2017-02-10 DIAGNOSIS — E559 Vitamin D deficiency, unspecified: Secondary | ICD-10-CM

## 2017-02-10 DIAGNOSIS — I5032 Chronic diastolic (congestive) heart failure: Secondary | ICD-10-CM | POA: Diagnosis not present

## 2017-02-10 DIAGNOSIS — Z9111 Patient's noncompliance with dietary regimen: Secondary | ICD-10-CM | POA: Diagnosis not present

## 2017-02-10 DIAGNOSIS — E1151 Type 2 diabetes mellitus with diabetic peripheral angiopathy without gangrene: Secondary | ICD-10-CM

## 2017-02-10 DIAGNOSIS — Z9114 Patient's other noncompliance with medication regimen: Secondary | ICD-10-CM | POA: Diagnosis not present

## 2017-02-10 DIAGNOSIS — Z91119 Patient's noncompliance with dietary regimen due to unspecified reason: Secondary | ICD-10-CM

## 2017-02-10 DIAGNOSIS — I480 Paroxysmal atrial fibrillation: Secondary | ICD-10-CM | POA: Diagnosis not present

## 2017-02-10 LAB — CBC WITH DIFFERENTIAL/PLATELET
BASOS ABS: 0 10*3/uL (ref 0.0–0.1)
Basophils Relative: 0.8 % (ref 0.0–3.0)
EOS ABS: 0.1 10*3/uL (ref 0.0–0.7)
Eosinophils Relative: 2.5 % (ref 0.0–5.0)
HCT: 30.1 % — ABNORMAL LOW (ref 39.0–52.0)
Hemoglobin: 10.4 g/dL — ABNORMAL LOW (ref 13.0–17.0)
LYMPHS ABS: 0.7 10*3/uL (ref 0.7–4.0)
Lymphocytes Relative: 15.3 % (ref 12.0–46.0)
MCHC: 34.5 g/dL (ref 30.0–36.0)
MCV: 87.4 fl (ref 78.0–100.0)
Monocytes Absolute: 0.4 10*3/uL (ref 0.1–1.0)
Monocytes Relative: 9.2 % (ref 3.0–12.0)
NEUTROS ABS: 3.5 10*3/uL (ref 1.4–7.7)
NEUTROS PCT: 72.2 % (ref 43.0–77.0)
PLATELETS: 157 10*3/uL (ref 150.0–400.0)
RBC: 3.45 Mil/uL — ABNORMAL LOW (ref 4.22–5.81)
RDW: 16.1 % — AB (ref 11.5–15.5)
WBC: 4.9 10*3/uL (ref 4.0–10.5)

## 2017-02-10 LAB — BASIC METABOLIC PANEL
BUN: 44 mg/dL — ABNORMAL HIGH (ref 6–23)
CALCIUM: 8.9 mg/dL (ref 8.4–10.5)
CO2: 26 meq/L (ref 19–32)
CREATININE: 2.01 mg/dL — AB (ref 0.40–1.50)
Chloride: 104 mEq/L (ref 96–112)
GFR: 34.65 mL/min — ABNORMAL LOW (ref >60.00)
GLUCOSE: 143 mg/dL — AB (ref 70–99)
Potassium: 4.8 mEq/L (ref 3.5–5.1)
Sodium: 136 mEq/L (ref 135–145)

## 2017-02-10 NOTE — Assessment & Plan Note (Signed)
On Vit D 

## 2017-02-10 NOTE — Assessment & Plan Note (Signed)
Labs

## 2017-02-10 NOTE — Assessment & Plan Note (Signed)
CBC

## 2017-02-10 NOTE — Telephone Encounter (Signed)
Hgb a little better  He needs to be monitored about every 3 mos w/ a CBC

## 2017-02-10 NOTE — Assessment & Plan Note (Signed)
Discussed NAS, ADA diet

## 2017-02-10 NOTE — Assessment & Plan Note (Signed)
Discussed NAS diet They ate at [facility] prior to admission

## 2017-02-10 NOTE — Assessment & Plan Note (Addendum)
In NSR now On Toprol

## 2017-02-10 NOTE — Progress Notes (Signed)
Subjective:  Patient ID: Michael [name], male    DOB: 1940-11-06  Age: 76 y.o. MRN: [id number]  CC: No chief complaint on file.   HPI Michael [name] presents for post-hosp f/u. He was admitted for CHF and went home on 7/15. I reviewed documents. They ate at [facility] prior to admission  Hx: "CHF Clinical finding consistent with diagnosis elevated BNP, shortness of breath, orthopnea, bilateral leg edema and positive JVD with chest x-ray consistent with pulmonary edema. Echocardiogram done today shows EF of 60-65% with grade 2 diastolic dysfunction, no motion abnormality, increasing pulmonary artery peak pressure. - Unchanged from echo on May 2018. Trops negative  EKG no ischemic changes  Will hold lasix for 24 hrs as there is evidence of over diureses, can resume Lasix on 02/08/17 at his normal home dose. BNP decreased from admission  I&O's Net - 3L  Patient is instructed to follow up with PCP in 3-5 days  PAF -  HR was sinus brady during hospital stay - he report HR ranging 50-60 at home  CHADSVASC score 5  Not on AC due to history of AVM Given EKG show 1st degree AV block, will discharge patient on Metoprolol and Cardizem only  Will hold Amiodarone until patient is seen by cardiology. If HR increase > 110 patient advised to resume Amio.  Tele only showed sinus brady during hospital stay  Follow up with cardiology   DM II - stable  Last A1C 5.4 on 01/21/17 No changes in medications were made   HTN - BP stable  Will continue Metoprolol, hydralazine and Cardizem  Patient also on Lasix  Follow up with PCP.   Iron def/chronic disease anemia No signs of overt bleeding Hgb stable  Monitor as outpatient "   Outpatient Medications Prior to Visit  Medication Sig Dispense Refill  . allopurinol (ZYLOPRIM) 100 MG tablet take 2 tablets by mouth daily 60 tablet 5  . aspirin 81 MG chewable tablet Chew 1 tablet (81 mg total) by mouth daily. 30 tablet 0  . atorvastatin (LIPITOR)  40 MG tablet take 1 tablet by mouth once daily 90 tablet 2  . Blood Glucose Monitoring Suppl (ONE TOUCH ULTRA 2) W/DEVICE KIT Use as directed Dx E11.9 1 each 0  . Blood Pressure Monitor KIT Use to check blood pressure daily Dx I10 1 each 0  . CARTIA XT 180 MG 24 hr capsule take 1 capsule by mouth once daily 90 capsule 3  . Cholecalciferol 1000 UNITS TBDP Take 1,000-5,000 Units by mouth daily. Takes 1000 units everyday except for on Saturday patient takes 5000 units    . clopidogrel (PLAVIX) 75 MG tablet Take 1 tablet (75 mg total) by mouth daily with breakfast. 90 tablet 3  . fenofibrate 160 MG tablet TAKE 1 TABLET(160 MG) BY MOUTH DAILY 90 tablet 3  . ferrous sulfate 325 (65 FE) MG tablet Take 325 mg by mouth 3 (three) times daily with meals.    . furosemide (LASIX) 20 MG tablet Take 2 tablets (40 mg total) by mouth daily. 60 tablet 3  . glipiZIDE (GLUCOTROL) 10 MG tablet Take 1 tablet (10 mg total) by mouth 2 (two) times daily before a meal. 60 tablet 5  . glucose blood (ONE TOUCH ULTRA TEST) test strip 1 each by Other route 4 (four) times daily. Use to check blood sugar four times a day  Dx: E11.9- insulin Dependant 120 each 5  . hydrALAZINE (APRESOLINE) 25 MG tablet Take 1 tablet (  25 mg total) by mouth 3 (three) times daily. 90 tablet 3  . HYDROcodone-acetaminophen (NORCO/VICODIN) 5-325 MG tablet Take 1 tablet by mouth 2 (two) times daily as needed for moderate pain. 60 tablet 0  . Insulin Glargine (TOUJEO SOLOSTAR) 300 UNIT/ML SOPN Inject 52 Units into the skin daily.    . Insulin Pen Needle 31G X 5 MM MISC 1 Units by Does not apply route 4 (four) times daily. 150 each 11  . Lancets (ONETOUCH ULTRASOFT) lancets Use to check blood sugars daily 30 each 11  . levothyroxine (SYNTHROID, LEVOTHROID) 150 MCG tablet TAKE 1 TABLET BY MOUTH DAILY BEFORE BREAKFAST 90 tablet 3  . Metoprolol Tartrate 75 MG TABS Take 75 mg by mouth 2 (two) times daily. 180 tablet 3  . omeprazole (PRILOSEC) 40 MG capsule  take 1 capsule by mouth once daily 90 capsule 1  . potassium chloride SA (K-DUR,KLOR-CON) 20 MEQ tablet Take 1 tablet (20 mEq total) by mouth daily. 30 tablet 3   No facility-administered medications prior to visit.     ROS Review of Systems  Constitutional: Negative for appetite change, fatigue and unexpected weight change.  HENT: Negative for congestion, nosebleeds, sneezing, sore throat and trouble swallowing.   Eyes: Negative for itching and visual disturbance.  Respiratory: Negative for cough.   Cardiovascular: Negative for chest pain, palpitations and leg swelling.  Gastrointestinal: Negative for abdominal distention, blood in stool, diarrhea and nausea.  Genitourinary: Negative for frequency and hematuria.  Musculoskeletal: Negative for back pain, gait problem, joint swelling and neck pain.  Skin: Negative for rash.  Neurological: Negative for dizziness, tremors, speech difficulty and weakness.  Psychiatric/Behavioral: Negative for agitation, dysphoric mood, sleep disturbance and suicidal ideas. The patient is not nervous/anxious.     Objective:  BP 130/68 (BP Location: Left Arm, Patient Position: Sitting, Cuff Size: Large)   Pulse (!) 52   Temp 97.6 F (36.4 C) (Oral)   Ht _0  (1.803 m)   Wt 245 lb (111.1 kg)   SpO2 99%   BMI 34.17 kg/m   BP Readings from Last 3 Encounters:  02/10/17 130/68  02/07/17 (!) 149/78  01/19/17 132/64    Wt Readings from Last 3 Encounters:  02/10/17 245 lb (111.1 kg)  02/07/17 248 lb 7.3 oz (112.7 kg)  01/19/17 248 lb (112.5 kg)    Physical Exam  Constitutional: He is oriented to person, place, and time. He appears well-developed. No distress.  NAD  HENT:  Mouth/Throat: Oropharynx is clear and moist.  Eyes: Pupils are equal, round, and reactive to light. Conjunctivae are normal.  Neck: Normal range of motion. No JVD present. No thyromegaly present.  Cardiovascular: Normal rate, regular rhythm and intact distal pulses.  Exam  reveals no gallop and no friction rub.   Murmur heard. Pulmonary/Chest: Effort normal and breath sounds normal. No respiratory distress. He has no wheezes. He has no rales. He exhibits no tenderness.  Abdominal: Soft. Bowel sounds are normal. He exhibits no distension and no mass. There is no tenderness. There is no rebound and no guarding.  Musculoskeletal: Normal range of motion. He exhibits tenderness. He exhibits no edema.  Lymphadenopathy:    He has no cervical adenopathy.  Neurological: He is alert and oriented to person, place, and time. He has normal reflexes. No cranial nerve deficit. He exhibits normal muscle tone. He displays a negative Romberg sign. Coordination and gait normal.  Skin: Skin is warm and dry. Rash noted.  Psychiatric: He has a normal mood  and affect. His behavior is normal. Judgment and thought content normal.  hyperpigmented ankles/shins   Lab Results  Component Value Date   WBC 3.5 (L) 02/07/2017   HGB 10.3 (L) 02/07/2017   HCT 30.6 (L) 02/07/2017   PLT 108 (L) 02/07/2017   GLUCOSE 143 (H) 02/07/2017   CHOL 101 08/01/2016   TRIG 344 (H) 08/01/2016   HDL 22 (L) 08/01/2016   LDLDIRECT 26.0 03/20/2015   LDLCALC 10 08/01/2016   ALT 9 (L) 08/10/2016   AST 23 08/10/2016   NA 139 02/07/2017   K 4.4 02/07/2017   CL 104 02/07/2017   CREATININE 2.04 (H) 02/07/2017   BUN 32 (H) 02/07/2017   CO2 29 02/07/2017   TSH 4.452 07/27/2016   PSA 0.50 11/07/2013   INR 1.06 12/08/2016   HGBA1C 5.4 01/19/2017   MICROALBUR 1.2 05/16/2013    Dg Chest 2 View  Result Date: 02/05/2017 CLINICAL DATA:  Shortness of breath EXAM: CHEST  2 VIEW COMPARISON:  12/23/2016 FINDINGS: Chronic cardiomegaly. Unremarkable aortic contours. Congested appearance of the hila. Diffuse interstitial opacities with Awanda Mink and small left pleural effusion. IMPRESSION: CHF pattern. Electronically Signed   By: Monte Fantasia M.D.   On: 02/05/2017 16:03    Assessment & Plan:   There are no  diagnoses linked to this encounter. I am having Mr. Simkin maintain his ferrous sulfate, ONE TOUCH ULTRA 2, onetouch ultrasoft, Cholecalciferol, Blood Pressure Monitor, levothyroxine, Insulin Pen Needle, glucose blood, aspirin, fenofibrate, potassium chloride SA, Insulin Glargine, HYDROcodone-acetaminophen, glipiZIDE, clopidogrel, Metoprolol Tartrate, CARTIA XT, furosemide, atorvastatin, omeprazole, allopurinol, and hydrALAZINE.  No orders of the defined types were placed in this encounter.    Follow-up: No Follow-up on file.  [name], MD

## 2017-03-01 ENCOUNTER — Ambulatory Visit (INDEPENDENT_AMBULATORY_CARE_PROVIDER_SITE_OTHER): Payer: Medicare Other | Admitting: Cardiovascular Disease

## 2017-03-01 ENCOUNTER — Encounter: Payer: Self-pay | Admitting: Cardiovascular Disease

## 2017-03-01 VITALS — BP 170/62 | HR 58 | Ht 71.0 in | Wt 243.0 lb

## 2017-03-01 DIAGNOSIS — I480 Paroxysmal atrial fibrillation: Secondary | ICD-10-CM | POA: Diagnosis not present

## 2017-03-01 DIAGNOSIS — G4733 Obstructive sleep apnea (adult) (pediatric): Secondary | ICD-10-CM

## 2017-03-01 DIAGNOSIS — E669 Obesity, unspecified: Secondary | ICD-10-CM | POA: Diagnosis not present

## 2017-03-01 DIAGNOSIS — I5032 Chronic diastolic (congestive) heart failure: Secondary | ICD-10-CM | POA: Diagnosis not present

## 2017-03-01 DIAGNOSIS — I10 Essential (primary) hypertension: Secondary | ICD-10-CM

## 2017-03-01 NOTE — Patient Instructions (Signed)
Testing/Procedures: Please start using your CPAP machine as soon as possible. Dr. Claiborne Billings recommends using your machine for the next month in order to determine the settings needed.  Follow-Up: Your physician recommends that you schedule a follow-up appointment in: October with Dr. Claiborne Billings (sleep clinic)   Any Other Special Instructions Will Be Listed Below (If Applicable).     If you need a refill on your cardiac medications before your next appointment, please call your pharmacy.

## 2017-03-01 NOTE — Progress Notes (Signed)
Cardiology Office Note    Date:  03/10/2017   ID:  Michael [name] 1941/05/25, MRN [id number]  PCP:  Cassandria Anger, MD  Cardiologist:  Shelva Majestic, MD (sleep); Dr. Johnsie Cancel  New Sleep evaluation.  History of Present Illness:  Michael [name] is a 76 y.o. male who is followed by Dr. Johnsie Cancel and is a former patient of Dr. Mare Ferrari.  He presents for sleep clinic evaluation following undergoing a evaluation for sleep apnea.  [name] has a history of hypertension, hyperlipidemia, diabetes mellitus,  carotid artery disease, CAD, as well as paroxysmal atrial fibrillation.  He has had at least 3 episodes of atrial fibrillation.  In January 2018.  He suffered a non-ST segment elevation MI and was found to have 80% stenosis in the circumflex marginal 1 vessel for which she underwent successful stenting.  He had concomitant CAD in the LAD, diagonal, AV groove circumflex and RCA.  He has had issues with chronic diastolic heart failure in the setting of anemia.  He also had issues with diarrhea secondary to C. difficile.  With his cardiovascular comorbidities, as well as a history of snoring, atrial fibrillation, and nonrestorative sleep, he was referred for a sleep study which was done on 10/28/2016.  He was found to have mild sleep apnea overall with an HI of 7.9 per hour.  However, sleep apnea was moderate with AHI of 17.3 per hour during rim sleep.  He had significant oxygen desaturation to 81% and there was evidence for moderate snoring.  CPAP titration was recommended but he never followed up with this.  Because he was getting close to the six-month required timeframe, he was recommended he initiate a CPAP auto evaluation at home.  Which was set up.  I begin respiratory services.  As of July 23.  He had not had any CPAP use.  Patient is aware of his compliance risk and the fact that compliance must be met within 90 days of initiation.  The patient states that he has had difficulty with his sinuses  and postnasal drip which is the reason why he had not been using his CPAP.  He admits to snoring.  He denies daytime sleepiness.  He presents for evaluation.   Past Medical History:  Diagnosis Date  . Acute on chronic heart failure ([place])   . Allergy   . Arthritis   . AVM (arteriovenous malformation) of colon   . C. difficile enteritis   . CAD (coronary artery disease)    a. Cath 10/2014 - Mild to moderate diagonal, circumflex and obtuse marginal disease. Innominate artery sternosis.  . Carotid artery disease ([place])    a. Carotid dopple 03/2014 29-79% RICA &  89-21% LICA stenosis b. 1/94  . CKD (chronic kidney disease), stage III   . Clostridium difficile colitis 05/13/2016  . Dyspnea   . Dysrhythmia    ATRIAL FIBRILATION  . Elevated troponin    a. 09/2014 in setting of AF RVR-->Myoview: EF 49%, no ischemia/infarct->Med Rx.  . GERD (gastroesophageal reflux disease)   . Gout   . H/O transfusion of whole blood   . Heart murmur   . History of PFTs    PFTs 6/16:  FVC 3.73 (87%), FEV1 2.93 (88%), FEV1/FVC 78%, DLCO 69%  . Hx of adenomatous colonic polyps 07/02/2016  . Hyperlipidemia   . Hypertension   . Hypothyroidism   . Iron deficiency anemia   . PAF (paroxysmal atrial fibrillation) ([place])    a. 09/2014: Converted  on Dilt;  b. CHA2DS2VASc = 3-->eliquis;  c. 09/2014 Echo: EF 55-60%, mild LVH, mildly dil LA.  [name] Personal history of colonic polyps 2007, 2008   adenoma each time, largest 12 mm in 2007  . Psoriasis   . Staph infection 07/2016   left hand   . Staphylococcus aureus bacteremia 07/31/2016  . Type II diabetes mellitus ([place])    TYPE 2    Past Surgical History:  Procedure Laterality Date  . APPENDECTOMY  02/09/2014  . CARDIAC CATHETERIZATION N/A 07/29/2016   Procedure: Right/Left Heart Cath and Coronary Angiography;  Surgeon: Nelva Bush, MD;  Location: [facility];  Service: Cardiovascular;  Laterality: N/A;  . CARDIAC CATHETERIZATION N/A 07/29/2016   Procedure:  Coronary Stent Intervention;  Surgeon: Nelva Bush, MD;  Location: [facility];  Service: Cardiovascular;  Laterality: N/A;  . COLONOSCOPY  02/10/2011   internal hemorrhoids  . COLONOSCOPY W/ POLYPECTOMY  04/09/2006   12 mm adenoma  . COLONOSCOPY W/ POLYPECTOMY  06/17/2007   5 mm adenoma  . COLONOSCOPY WITH PROPOFOL N/A 05/13/2016   Procedure: COLONOSCOPY WITH PROPOFOL;  Surgeon: Manus Gunning, MD;  Location: [facility];  Service: Gastroenterology;  Laterality: N/A;  . CORONARY STENT PLACEMENT  07/29/2016   OMI    DES  . ENTEROSCOPY N/A 08/14/2016   Procedure: ENTEROSCOPY;  Surgeon: Manus Gunning, MD;  Location: [facility];  Service: Gastroenterology;  Laterality: N/A;  . ESOPHAGOGASTRODUODENOSCOPY  12/23/2011   Procedure: ESOPHAGOGASTRODUODENOSCOPY (EGD);  Surgeon: Irene Shipper, MD;  Location: [facility];  Service: Endoscopy;  Laterality: N/A;  with small bowel bx's  . GIVENS CAPSULE STUDY  12/28/2011  . GIVENS CAPSULE STUDY N/A 08/14/2016   Procedure: GIVENS CAPSULE STUDY;  Surgeon: Manus Gunning, MD;  Location: [facility];  Service: Gastroenterology;  Laterality: N/A;  . KNEE ARTHROSCOPY Right   . LAPAROSCOPIC APPENDECTOMY N/A 02/09/2014   Procedure: APPENDECTOMY LAPAROSCOPIC;  Surgeon: Leighton Ruff, MD;  Location: [facility];  Service: General;  Laterality: N/A;  . LEFT HEART CATHETERIZATION WITH CORONARY ANGIOGRAM N/A 11/15/2014   Procedure: LEFT HEART CATHETERIZATION WITH CORONARY ANGIOGRAM;  Surgeon: Belva Crome, MD;  Location[facility];  Service: Cardiovascular;  Laterality: N/A;  . ROTATOR CUFF REPAIR     left    Current Medications: Outpatient Medications Prior to Visit  Medication Sig Dispense Refill  . allopurinol (ZYLOPRIM) 100 MG tablet take 2 tablets by mouth daily 60 tablet 5  . aspirin 81 MG chewable tablet Chew 1 tablet (81 mg total) by mouth daily. 30 tablet 0  . atorvastatin (LIPITOR) 40 MG tablet take 1 tablet by mouth once  daily 90 tablet 2  . Blood Glucose Monitoring Suppl (ONE TOUCH ULTRA 2) W/DEVICE KIT Use as directed Dx E11.9 1 each 0  . Blood Pressure Monitor KIT Use to check blood pressure daily Dx I10 1 each 0  . CARTIA XT 180 MG 24 hr capsule take 1 capsule by mouth once daily 90 capsule 3  . Cholecalciferol 1000 UNITS TBDP Take 1,000-5,000 Units by mouth daily. Takes 1000 units everyday except for on Saturday patient takes 5000 units    . clopidogrel (PLAVIX) 75 MG tablet Take 1 tablet (75 mg total) by mouth daily with breakfast. 90 tablet 3  . fenofibrate 160 MG tablet TAKE 1 TABLET(160 MG) BY MOUTH DAILY 90 tablet 3  . ferrous sulfate 325 (65 FE) MG tablet Take 325 mg by mouth 3 (three) times daily with meals.    [name]  furosemide (LASIX) 20 MG tablet Take 2 tablets (40 mg total) by mouth daily. 60 tablet 3  . glipiZIDE (GLUCOTROL) 10 MG tablet Take 1 tablet (10 mg total) by mouth 2 (two) times daily before a meal. 60 tablet 5  . glucose blood (ONE TOUCH ULTRA TEST) test strip 1 each by Other route 4 (four) times daily. Use to check blood sugar four times a day  Dx: E11.9- insulin Dependant 120 each 5  . hydrALAZINE (APRESOLINE) 25 MG tablet Take 1 tablet (25 mg total) by mouth 3 (three) times daily. 90 tablet 3  . HYDROcodone-acetaminophen (NORCO/VICODIN) 5-325 MG tablet Take 1 tablet by mouth 2 (two) times daily as needed for moderate pain. 60 tablet 0  . Insulin Glargine (TOUJEO SOLOSTAR) 300 UNIT/ML SOPN Inject 52 Units into the skin daily.    . Insulin Pen Needle 31G X 5 MM MISC 1 Units by Does not apply route 4 (four) times daily. 150 each 11  . Lancets (ONETOUCH ULTRASOFT) lancets Use to check blood sugars daily 30 each 11  . Metoprolol Tartrate 75 MG TABS Take 75 mg by mouth 2 (two) times daily. 180 tablet 3  . omeprazole (PRILOSEC) 40 MG capsule take 1 capsule by mouth once daily 90 capsule 1  . potassium chloride SA (K-DUR,KLOR-CON) 20 MEQ tablet Take 1 tablet (20 mEq total) by mouth daily. 30  tablet 3  . levothyroxine (SYNTHROID, LEVOTHROID) 150 MCG tablet TAKE 1 TABLET BY MOUTH DAILY BEFORE BREAKFAST 90 tablet 3   No facility-administered medications prior to visit.      Allergies:   Percocet [oxycodone-acetaminophen]; Invokana [canagliflozin]; and Metformin and related   Social History   Social History  . Marital status: Married    Spouse name: N/A  . Number of children: N/A  . Years of education: 30   Occupational History  .  Retired    Social History Main Topics  . Smoking status: Former Smoker    Packs/day: 0.70    Years: 48.00    Types: Cigarettes    Quit date: 03/29/2006  . Smokeless tobacco: Never Used  . Alcohol use 0.6 oz/week    1 Cans of beer per week     Comment: occasional alcohol intake  . Drug use: No  . Sexual activity: Not Currently   Other Topics Concern  . None   Social History Narrative   Regular exercise-no   Caffeine Use-yes     Family History:  The patient's family history includes COPD in his sister; Cancer in his brother; Diabetes in his father; Heart attack in his father; Heart disease in his brother; Hypertension in his father and mother; Lung cancer in his father; Stroke in his father and mother.   ROS General: Negative; No fevers, chills, or night sweats;  HEENT: Negative; No changes in vision or hearing, sinus congestion, difficulty swallowing Pulmonary: Negative; No cough, wheezing, shortness of breath, hemoptysis Cardiovascular: Negative; No chest pain, presyncope, syncope, palpitations GI: Negative; No nausea, vomiting, diarrhea, or abdominal pain GU: Negative; No dysuria, hematuria, or difficulty voiding Musculoskeletal: Negative; no myalgias, joint pain, or weakness Hematologic/Oncology: Negative; no easy bruising, bleeding Endocrine: Negative; no heat/cold intolerance; no diabetes Neuro: Negative; no changes in balance, headaches Skin: Negative; No rashes or skin lesions Psychiatric: Negative; No behavioral problems,  depression Sleep: Negative; No snoring, daytime sleepiness, hypersomnolence, bruxism, restless legs, hypnogognic hallucinations, no cataplexy Other comprehensive 14 point system review is negative.   PHYSICAL EXAM:   VS:  BP (!) 170/62  Pulse (!) 58   Ht 5' 11"  (1.803 m)   Wt 243 lb (110.2 kg)   BMI 33.89 kg/m     Repeat blood pressure was 154/70  Wt Readings from Last 3 Encounters:  03/01/17 243 lb (110.2 kg)  02/10/17 245 lb (111.1 kg)  02/07/17 248 lb 7.3 oz (112.7 kg)    General: Alert, oriented, no distress.  Skin: normal turgor, no rashes, warm and dry HEENT: Normocephalic, atraumatic. Pupils equal round and reactive to light; sclera anicteric; extraocular muscles intact; Fundi Mild arterial narrowing.  No hemorrhages or exudates. Nose without nasal septal hypertrophy Mouth/Parynx benign; Mallinpatti scale 3 Neck: No JVD, no carotid bruits; normal carotid upstroke Lungs: clear to ausculatation and percussion; no wheezing or rales Chest wall: without tenderness to palpitation Heart: PMI not displaced, RRR, s1 s2 normal, 1/6 systolic murmur, no diastolic murmur, no rubs, gallops, thrills, or heaves Abdomen: soft, nontender; no hepatosplenomehaly, BS+; abdominal aorta nontender and not dilated by palpation. Back: no CVA tenderness Pulses 2+ Musculoskeletal: full range of motion, normal strength, no joint deformities Extremities: no clubbing cyanosis or edema, Homan's sign negative  Neurologic: grossly nonfocal; Cranial nerves grossly wnl Psychologic: Normal mood and affect   Studies/Labs Reviewed:   EKG:  EKG is  ordered today.  ECG (independently read by me): Sinus bradycardia at 58 bpm.  Nonspecific ST changes.  Normal intervals.  Recent Labs: BMP Latest Ref Rng & Units 02/10/2017 02/07/2017 02/06/2017  Glucose 70 - 99 mg/dL 143(H) 143(H) 125(H)  BUN 6 - 23 mg/dL 44(H) 32(H) 37(H)  Creatinine 0.40 - 1.50 mg/dL 2.01(H) 2.04(H) 1.78(H)  BUN/Creat Ratio 10 - 24 - - -    Sodium 135 - 145 mEq/L 136 139 140  Potassium 3.5 - 5.1 mEq/L 4.8 4.4 4.5  Chloride 96 - 112 mEq/L 104 104 108  CO2 19 - 32 mEq/L 26 29 26   Calcium 8.4 - 10.5 mg/dL 8.9 8.9 8.6(L)     Hepatic Function Latest Ref Rng & Units 08/10/2016 07/30/2016 07/27/2016  Total Protein 6.5 - 8.1 g/dL 6.6 6.7 5.9(L)  Albumin 3.5 - 5.0 g/dL 2.7(L) 3.5 3.5  AST 15 - 41 U/L 23 40 60(H)  ALT 17 - 63 U/L 9(L) 24 22  Alk Phosphatase 38 - 126 U/L 67 36(L) 33(L)  Total Bilirubin 0.3 - 1.2 mg/dL 0.6 1.3(H) 1.4(H)  Bilirubin, Direct 0.1 - 0.5 mg/dL - 0.4 -    CBC Latest Ref Rng & Units 02/10/2017 02/07/2017 02/05/2017  WBC 4.0 - 10.5 K/uL 4.9 3.5(L) 4.5  Hemoglobin 13.0 - 17.0 g/dL 10.4(L) 10.3(L) 9.8(L)  Hematocrit 39.0 - 52.0 % 30.1(L) 30.6(L) 29.1(L)  Platelets 150.0 - 400.0 K/uL 157.0 108(L) 129(L)   Lab Results  Component Value Date   MCV 87.4 02/10/2017   MCV 87.9 02/07/2017   MCV 88.2 02/05/2017   Lab Results  Component Value Date   TSH 4.452 07/27/2016   Lab Results  Component Value Date   HGBA1C 5.4 01/19/2017     BNP    Component Value Date/Time   BNP 351.5 (H) 02/07/2017 0449    ProBNP No results found for: PROBNP   Lipid Panel     Component Value Date/Time   CHOL 101 08/01/2016 0440   TRIG 344 (H) 08/01/2016 0440   HDL 22 (L) 08/01/2016 0440   CHOLHDL 4.6 08/01/2016 0440   VLDL 69 (H) 08/01/2016 0440   LDLCALC 10 08/01/2016 0440   LDLDIRECT 26.0 03/20/2015 1003     RADIOLOGY: No results  found.   Additional studies/ records that were reviewed today include:  I reviewed the patient's hospital records as well as office visits.  I reviewed his sleep study.    ASSESSMENT:    1. Paroxysmal atrial fibrillation (HCC)   2. OSA (obstructive sleep apnea)   3. Essential hypertension   4. Chronic diastolic CHF (congestive heart failure) ([place])   5. Mild obesity      PLAN:  Mr. Mckenzie Toruno is a 76 year old male with significant cardiovascular comorbidities including  ED, PVD, paroxysmal atrial fibrillation, grade 2 diastolic dysfunction, who was recently found to have mild sleep apnea.  Overall, but moderate sleep apnea during REM sleep with significant oxygen desaturation to a nadir of 81% on his diagnostic polysomnogram.  He never followed up with CPAP titration study.  As result, we attempted for him to undergo an AutoPap titration at home and despite getting the machine to initiate this.  He is not yet started treatment.  I long discussion with him today in the office.  I thoroughly reviewed in detail the effect of untreated sleep apnea with reference to his cardiovascular health.  We discussed the increased recurrence risk of atrial fibrillation in addition to its contribution to hypertension, pulmonary hypertension, and its association with heart failure.  I discussed with him the importance of implementing the AutoPap titration so that we can document potential compliance.  Otherwise, if he does not have this done within 6 months from his initial evaluation.  He will need to reinitiate the entire process again.  Some of his difficulty has been with nasal congestion.  He does have increased postnasal drip.  I have suggested that he takes Zyrtec or Claritin which should improve potential for this.  I also have suggested use of nasal saline before going to bed and in the morning to wash out his sinuses and improve aeration.  We will need to obtain a download to verify his use and ultimately then prescribed optimal treatment.  Presently, his blood pressure was elevated on his regimen of metoprolol 75 mg twice a day.  If this continues to be elevated additional medical therapy may be necessary.  He also is on levothyroxine for hypothyroidism.  Presently, he is euvolemic on exam.  BMI is increased at 33.89 and is consistent with mild obesity.  I discussed the importance of weight loss.  I also discussed the potential attenuation of sleep apnea with frequent exercise.  I will  see him in 6 weeks for follow-up evaluation   Medication Adjustments/Labs and Tests Ordered: Current medicines are reviewed at length with the patient today.  Concerns regarding medicines are outlined above.  Medication changes, Labs and Tests ordered today are listed in the Patient Instructions below. Patient Instructions  Testing/Procedures: Please start using your CPAP machine as soon as possible. Dr. Claiborne Billings recommends using your machine for the next month in order to determine the settings needed.  Follow-Up: Your physician recommends that you schedule a follow-up appointment in: October with Dr. Claiborne Billings (sleep clinic)   Any Other Special Instructions Will Be Listed Below (If Applicable).     If you need a refill on your cardiac medications before your next appointment, please call your pharmacy.      Signed, Shelva Majestic, MD  03/10/2017 11:23 PM    [facility] [address], [place], [place], [place]  [address] Phone: [phone]

## 2017-03-04 ENCOUNTER — Other Ambulatory Visit: Payer: Self-pay | Admitting: Internal Medicine

## 2017-03-04 DIAGNOSIS — E119 Type 2 diabetes mellitus without complications: Secondary | ICD-10-CM | POA: Diagnosis not present

## 2017-03-05 NOTE — Telephone Encounter (Signed)
Pt's wife called checking on this refill.

## 2017-03-05 NOTE — Telephone Encounter (Signed)
Reviewed chart pt is up-to-date sent refill to pharmacy...Michael [name]

## 2017-03-30 ENCOUNTER — Other Ambulatory Visit: Payer: Self-pay

## 2017-03-30 ENCOUNTER — Ambulatory Visit (HOSPITAL_COMMUNITY): Payer: Medicare Other | Attending: Cardiology

## 2017-03-30 ENCOUNTER — Other Ambulatory Visit: Payer: Self-pay | Admitting: Cardiovascular Disease

## 2017-03-30 DIAGNOSIS — I2510 Atherosclerotic heart disease of native coronary artery without angina pectoris: Secondary | ICD-10-CM | POA: Diagnosis not present

## 2017-03-30 DIAGNOSIS — I080 Rheumatic disorders of both mitral and aortic valves: Secondary | ICD-10-CM | POA: Diagnosis not present

## 2017-03-30 DIAGNOSIS — I5030 Unspecified diastolic (congestive) heart failure: Secondary | ICD-10-CM | POA: Diagnosis not present

## 2017-03-30 DIAGNOSIS — I350 Nonrheumatic aortic (valve) stenosis: Secondary | ICD-10-CM

## 2017-03-30 DIAGNOSIS — I420 Dilated cardiomyopathy: Secondary | ICD-10-CM | POA: Insufficient documentation

## 2017-04-14 ENCOUNTER — Encounter: Payer: Self-pay | Admitting: Internal Medicine

## 2017-04-14 ENCOUNTER — Other Ambulatory Visit (INDEPENDENT_AMBULATORY_CARE_PROVIDER_SITE_OTHER): Payer: Medicare Other

## 2017-04-14 ENCOUNTER — Ambulatory Visit (INDEPENDENT_AMBULATORY_CARE_PROVIDER_SITE_OTHER): Payer: Medicare Other | Admitting: Internal Medicine

## 2017-04-14 DIAGNOSIS — I6523 Occlusion and stenosis of bilateral carotid arteries: Secondary | ICD-10-CM | POA: Diagnosis not present

## 2017-04-14 DIAGNOSIS — G2581 Restless legs syndrome: Secondary | ICD-10-CM | POA: Diagnosis not present

## 2017-04-14 DIAGNOSIS — I480 Paroxysmal atrial fibrillation: Secondary | ICD-10-CM | POA: Diagnosis not present

## 2017-04-14 DIAGNOSIS — E1140 Type 2 diabetes mellitus with diabetic neuropathy, unspecified: Secondary | ICD-10-CM

## 2017-04-14 DIAGNOSIS — I10 Essential (primary) hypertension: Secondary | ICD-10-CM

## 2017-04-14 DIAGNOSIS — E1151 Type 2 diabetes mellitus with diabetic peripheral angiopathy without gangrene: Secondary | ICD-10-CM

## 2017-04-14 DIAGNOSIS — N183 Chronic kidney disease, stage 3 unspecified: Secondary | ICD-10-CM

## 2017-04-14 DIAGNOSIS — I739 Peripheral vascular disease, unspecified: Secondary | ICD-10-CM

## 2017-04-14 LAB — HEMOGLOBIN A1C: Hgb A1c MFr Bld: 6 % (ref 4.6–6.5)

## 2017-04-14 MED ORDER — GABAPENTIN 100 MG PO CAPS: 100.0000 mg | ORAL_CAPSULE | Freq: Two times a day (BID) | ORAL | 5 refills | Status: DC

## 2017-04-14 MED ORDER — B COMPLEX PO TABS: 1.0000 | ORAL_TABLET | Freq: Every day | ORAL | 3 refills | Status: AC

## 2017-04-14 NOTE — Assessment & Plan Note (Signed)
On gabapentin now

## 2017-04-14 NOTE — Assessment & Plan Note (Signed)
Labs

## 2017-04-14 NOTE — Progress Notes (Signed)
Subjective:  Patient ID: Michael [name], male    DOB: 1940-09-05  Age: 76 y.o. MRN: [id number]  CC: No chief complaint on file.   HPI Michael [name] presents for DM, HTN, gout f/u  Outpatient Medications Prior to Visit  Medication Sig Dispense Refill  . allopurinol (ZYLOPRIM) 100 MG tablet take 2 tablets by mouth daily 60 tablet 5  . aspirin 81 MG chewable tablet Chew 1 tablet (81 mg total) by mouth daily. 30 tablet 0  . atorvastatin (LIPITOR) 40 MG tablet take 1 tablet by mouth once daily 90 tablet 2  . Blood Glucose Monitoring Suppl (ONE TOUCH ULTRA 2) W/DEVICE KIT Use as directed Dx E11.9 1 each 0  . Blood Pressure Monitor KIT Use to check blood pressure daily Dx I10 1 each 0  . CARTIA XT 180 MG 24 hr capsule take 1 capsule by mouth once daily 90 capsule 3  . Cholecalciferol 1000 UNITS TBDP Take 1,000-5,000 Units by mouth daily. Takes 1000 units everyday except for on Saturday patient takes 5000 units    . clopidogrel (PLAVIX) 75 MG tablet Take 1 tablet (75 mg total) by mouth daily with breakfast. 90 tablet 3  . fenofibrate 160 MG tablet TAKE 1 TABLET(160 MG) BY MOUTH DAILY 90 tablet 3  . ferrous sulfate 325 (65 FE) MG tablet Take 325 mg by mouth 3 (three) times daily with meals.    . furosemide (LASIX) 20 MG tablet Take 2 tablets (40 mg total) by mouth daily. 60 tablet 3  . glipiZIDE (GLUCOTROL) 10 MG tablet Take 1 tablet (10 mg total) by mouth 2 (two) times daily before a meal. 60 tablet 5  . glucose blood (ONE TOUCH ULTRA TEST) test strip 1 each by Other route 4 (four) times daily. Use to check blood sugar four times a day  Dx: E11.9- insulin Dependant 120 each 5  . hydrALAZINE (APRESOLINE) 25 MG tablet Take 1 tablet (25 mg total) by mouth 3 (three) times daily. 90 tablet 3  . HYDROcodone-acetaminophen (NORCO/VICODIN) 5-325 MG tablet Take 1 tablet by mouth 2 (two) times daily as needed for moderate pain. 60 tablet 0  . Insulin Glargine (TOUJEO SOLOSTAR) 300 UNIT/ML SOPN Inject 52  Units into the skin daily.    . Insulin Pen Needle 31G X 5 MM MISC 1 Units by Does not apply route 4 (four) times daily. 150 each 11  . Lancets (ONETOUCH ULTRASOFT) lancets Use to check blood sugars daily 30 each 11  . levothyroxine (SYNTHROID, LEVOTHROID) 150 MCG tablet take 1 tablet by mouth daily BEFORE BREAKFAST 90 tablet 1  . metoprolol tartrate (LOPRESSOR) 25 MG tablet Take 3 tablets by mouth 2 (two) times daily.  0  . Metoprolol Tartrate 75 MG TABS Take 75 mg by mouth 2 (two) times daily. 180 tablet 3  . omeprazole (PRILOSEC) 40 MG capsule take 1 capsule by mouth once daily 90 capsule 1  . potassium chloride SA (K-DUR,KLOR-CON) 20 MEQ tablet Take 1 tablet (20 mEq total) by mouth daily. 30 tablet 3   No facility-administered medications prior to visit.     ROS Review of Systems  Constitutional: Negative for appetite change, fatigue and unexpected weight change.  HENT: Negative for congestion, nosebleeds, sneezing, sore throat and trouble swallowing.   Eyes: Negative for itching and visual disturbance.  Respiratory: Negative for cough.   Cardiovascular: Negative for chest pain, palpitations and leg swelling.  Gastrointestinal: Negative for abdominal distention, blood in stool, diarrhea and nausea.  Genitourinary:  Negative for frequency and hematuria.  Musculoskeletal: Positive for arthralgias, back pain, neck pain and neck stiffness. Negative for gait problem and joint swelling.  Skin: Negative for rash.  Neurological: Negative for dizziness, tremors, speech difficulty and weakness.  Psychiatric/Behavioral: Negative for agitation, dysphoric mood and sleep disturbance. The patient is not nervous/anxious.     Objective:  BP (!) 150/68 (BP Location: Left Arm, Patient Position: Sitting, Cuff Size: Large)   Pulse 68   Temp 97.7 F (36.5 C) (Oral)   Ht _0  (1.803 m)   Wt 250 lb (113.4 kg)   SpO2 99%   BMI 34.87 kg/m   BP Readings from Last 3 Encounters:  04/14/17 (!) 150/68    03/01/17 (!) 170/62  02/10/17 130/68    Wt Readings from Last 3 Encounters:  04/14/17 250 lb (113.4 kg)  03/01/17 243 lb (110.2 kg)  02/10/17 245 lb (111.1 kg)    Physical Exam  Constitutional: He is oriented to person, place, and time. He appears well-developed. No distress.  NAD  HENT:  Mouth/Throat: Oropharynx is clear and moist.  Eyes: Pupils are equal, round, and reactive to light. Conjunctivae are normal.  Neck: Normal range of motion. No JVD present. No thyromegaly present.  Cardiovascular: Normal rate, regular rhythm, normal heart sounds and intact distal pulses.  Exam reveals no gallop and no friction rub.   No murmur heard. Pulmonary/Chest: Effort normal and breath sounds normal. No respiratory distress. He has no wheezes. He has no rales. He exhibits no tenderness.  Abdominal: Soft. Bowel sounds are normal. He exhibits no distension and no mass. There is no tenderness. There is no rebound and no guarding.  Musculoskeletal: Normal range of motion. He exhibits tenderness. He exhibits no edema.  Lymphadenopathy:    He has no cervical adenopathy.  Neurological: He is alert and oriented to person, place, and time. He has normal reflexes. No cranial nerve deficit. He exhibits normal muscle tone. He displays a negative Romberg sign. Coordination and gait normal.  Skin: Skin is warm and dry. No rash noted.  Psychiatric: He has a normal mood and affect. His behavior is normal. Judgment and thought content normal.    Lab Results  Component Value Date   WBC 4.9 02/10/2017   HGB 10.4 (L) 02/10/2017   HCT 30.1 (L) 02/10/2017   PLT 157.0 02/10/2017   GLUCOSE 143 (H) 02/10/2017   CHOL 101 08/01/2016   TRIG 344 (H) 08/01/2016   HDL 22 (L) 08/01/2016   LDLDIRECT 26.0 03/20/2015   LDLCALC 10 08/01/2016   ALT 9 (L) 08/10/2016   AST 23 08/10/2016   NA 136 02/10/2017   K 4.8 02/10/2017   CL 104 02/10/2017   CREATININE 2.01 (H) 02/10/2017   BUN 44 (H) 02/10/2017   CO2 26  02/10/2017   TSH 4.452 07/27/2016   PSA 0.50 11/07/2013   INR 1.06 12/08/2016   HGBA1C 5.4 01/19/2017   MICROALBUR 1.2 05/16/2013    Dg Chest 2 View  Result Date: 02/05/2017 CLINICAL DATA:  Shortness of breath EXAM: CHEST  2 VIEW COMPARISON:  12/23/2016 FINDINGS: Chronic cardiomegaly. Unremarkable aortic contours. Congested appearance of the hila. Diffuse interstitial opacities with Awanda Mink and small left pleural effusion. IMPRESSION: CHF pattern. Electronically Signed   By: Monte Fantasia M.D.   On: 02/05/2017 16:03    Assessment & Plan:   There are no diagnoses linked to this encounter. I am having Mr. Costabile maintain his ferrous sulfate, ONE TOUCH ULTRA 2, onetouch ultrasoft, Cholecalciferol,  Blood Pressure Monitor, Insulin Pen Needle, glucose blood, aspirin, fenofibrate, potassium chloride SA, Insulin Glargine, HYDROcodone-acetaminophen, glipiZIDE, clopidogrel, Metoprolol Tartrate, CARTIA XT, furosemide, atorvastatin, omeprazole, allopurinol, hydrALAZINE, metoprolol tartrate, and levothyroxine.  No orders of the defined types were placed in this encounter.    Follow-up: No Follow-up on file.  [name], MD

## 2017-04-14 NOTE — Assessment & Plan Note (Signed)
On amlodipine, cardizem

## 2017-04-14 NOTE — Assessment & Plan Note (Signed)
On Toujeo

## 2017-04-14 NOTE — Patient Instructions (Signed)
MC well w/Jill 

## 2017-04-14 NOTE — Assessment & Plan Note (Signed)
On ASA, Plavix

## 2017-04-14 NOTE — Assessment & Plan Note (Signed)
On ASA, Plavix, Lipitor

## 2017-04-28 ENCOUNTER — Telehealth: Payer: Self-pay | Admitting: *Deleted

## 2017-04-28 MED ORDER — GLUCOSE BLOOD VI STRP: 1.0000 | ORAL_STRIP | Freq: Four times a day (QID) | 1 refills | Status: DC

## 2017-04-28 MED ORDER — POTASSIUM CHLORIDE CRYS ER 20 MEQ PO TBCR: 20.0000 meq | EXTENDED_RELEASE_TABLET | Freq: Every day | ORAL | 1 refills | Status: DC

## 2017-04-28 MED ORDER — OMEPRAZOLE 40 MG PO CPDR: 40.0000 mg | DELAYED_RELEASE_CAPSULE | Freq: Every day | ORAL | 1 refills | Status: DC

## 2017-04-28 MED ORDER — GABAPENTIN 100 MG PO CAPS: 100.0000 mg | ORAL_CAPSULE | Freq: Two times a day (BID) | ORAL | 1 refills | Status: DC

## 2017-04-28 MED ORDER — ALLOPURINOL 100 MG PO TABS: 200.0000 mg | ORAL_TABLET | Freq: Every day | ORAL | 1 refills | Status: DC

## 2017-04-28 MED ORDER — ASPIRIN 81 MG PO CHEW: 81.0000 mg | CHEWABLE_TABLET | Freq: Every day | ORAL | 1 refills | Status: AC

## 2017-04-28 MED ORDER — GLIPIZIDE 10 MG PO TABS: 10.0000 mg | ORAL_TABLET | Freq: Two times a day (BID) | ORAL | 1 refills | Status: DC

## 2017-04-28 MED ORDER — HYDRALAZINE HCL 25 MG PO TABS: 25.0000 mg | ORAL_TABLET | Freq: Three times a day (TID) | ORAL | 1 refills | Status: DC

## 2017-04-28 MED ORDER — LEVOTHYROXINE SODIUM 150 MCG PO TABS: ORAL_TABLET | ORAL | 1 refills | Status: DC

## 2017-04-28 MED ORDER — ATORVASTATIN CALCIUM 40 MG PO TABS: 40.0000 mg | ORAL_TABLET | Freq: Every day | ORAL | 1 refills | Status: DC

## 2017-04-28 NOTE — Telephone Encounter (Signed)
Rec'd call from pt wife stating they are now using [facility] mail service just fought out all maintenance meds can have zero co-pay. I have reviewed the patient's medical history in detail and updated  Pharmacy inform wife [facility].Marland KitchenJohny Chess

## 2017-05-03 ENCOUNTER — Other Ambulatory Visit: Payer: Self-pay | Admitting: *Deleted

## 2017-05-03 DIAGNOSIS — I2510 Atherosclerotic heart disease of native coronary artery without angina pectoris: Secondary | ICD-10-CM

## 2017-05-03 MED ORDER — CLOPIDOGREL BISULFATE 75 MG PO TABS: 75.0000 mg | ORAL_TABLET | Freq: Every day | ORAL | 1 refills | Status: DC

## 2017-05-03 MED ORDER — METOPROLOL TARTRATE 25 MG PO TABS: 75.0000 mg | ORAL_TABLET | Freq: Two times a day (BID) | ORAL | 1 refills | Status: DC

## 2017-05-03 NOTE — Telephone Encounter (Signed)
Okay to refill these under Dr Johnsie Cancel? Not sure why they were refilled under Dr Fletcher Anon last. Looks like patient was started on them while he was under Dr Bobbye Riggs care. Please advise. Thanks, MI

## 2017-05-04 ENCOUNTER — Telehealth: Payer: Self-pay | Admitting: Internal Medicine

## 2017-05-04 MED ORDER — DILTIAZEM HCL ER COATED BEADS 180 MG PO CP24: 180.0000 mg | ORAL_CAPSULE | Freq: Every day | ORAL | 0 refills | Status: DC

## 2017-05-04 MED ORDER — FENOFIBRATE 160 MG PO TABS: ORAL_TABLET | ORAL | 0 refills | Status: DC

## 2017-05-04 MED ORDER — INSULIN GLARGINE 300 UNIT/ML ~~LOC~~ SOPN: 52.0000 [IU] | PEN_INJECTOR | Freq: Every day | SUBCUTANEOUS | 5 refills | Status: DC

## 2017-05-04 NOTE — Telephone Encounter (Signed)
Patient is requesting refill on toujeo.  Spouse states that this is a script that Dr. Camila Li writes for him.  It is showing historical provider under meds.  Patients uses [facility] on [address] .  Patient is almost out. Please follow up in regard.

## 2017-05-04 NOTE — Telephone Encounter (Signed)
Sent rx to [facility]...Michael [name]

## 2017-05-10 ENCOUNTER — Telehealth: Payer: Self-pay

## 2017-05-10 DIAGNOSIS — Z789 Other specified health status: Secondary | ICD-10-CM

## 2017-05-10 DIAGNOSIS — I739 Peripheral vascular disease, unspecified: Secondary | ICD-10-CM

## 2017-05-10 NOTE — Telephone Encounter (Signed)
Patient's wife (DPR) called back about message. Informed her that patient would need to go to the hospital to get lab work done. Patient will go sometime this week.

## 2017-05-10 NOTE — Telephone Encounter (Signed)
Left message for patient to call back. Patient is on Plavix and Omeprazole. This interaction may cause the PPI to decrease the effectiveness of Plavix. Dr. Johnsie Cancel wants patient to have P2Y12 lab test done to check Plavix effectiveness. Will have patient go to hospital to have lab work done.

## 2017-05-11 ENCOUNTER — Telehealth: Payer: Self-pay | Admitting: Cardiovascular Disease

## 2017-05-11 ENCOUNTER — Telehealth: Payer: Self-pay | Admitting: Internal Medicine

## 2017-05-11 NOTE — Telephone Encounter (Signed)
Reporting a drug interaction  clopidogrel (PLAVIX) 75 MG tablet and  omeprazole (PRILOSEC) 40 MG capsule,  PPI can decrease the effectiveness of the Plavix by 50%  Also atorvastatin (LIPITOR) 40 MG tablet  And fenofibrate 160 MG tablet  Interact, can cause severe myopathy and acute renal failure Please advise on dispensing  Please call directly at [phone]

## 2017-05-11 NOTE — Telephone Encounter (Signed)
Please call,there is an interaction between his Lipitor and Tricor.

## 2017-05-11 NOTE — Telephone Encounter (Signed)
Called pharmacy and advised them pt is ok to take Tricor and Lipitor.

## 2017-05-13 ENCOUNTER — Other Ambulatory Visit: Payer: Self-pay

## 2017-05-13 DIAGNOSIS — D649 Anemia, unspecified: Secondary | ICD-10-CM

## 2017-05-13 NOTE — Telephone Encounter (Signed)
The plavix and fenofibrate were prescribed by cardiology. They are doing blood work about the plavix and omeprazole to check the effectiveness(see copied TE below).  Please advise about atorvastatin and fenofibrate.    Note    Patient's wife (DPR) called back about message. Informed her that patient would need to go to the hospital to get lab work done. Patient will go sometime this week.     Michaelyn Barter, RN      05/10/17 10:47 AM  Note    Left message for patient to call back. Patient is on Plavix and Omeprazole. This interaction may cause the PPI to decrease the effectiveness of Plavix. Dr. Johnsie Cancel wants patient to have P2Y12 lab test done to check Plavix effectiveness. Will have patient go to hospital to have lab work done.

## 2017-05-14 ENCOUNTER — Other Ambulatory Visit (HOSPITAL_COMMUNITY)
Admission: RE | Admit: 2017-05-14 | Discharge: 2017-05-14 | Disposition: A | Discharge status: Home / Self Care | Payer: Medicare Other | Source: Ambulatory Visit | Attending: Cardiovascular Disease | Admitting: Cardiovascular Disease

## 2017-05-14 ENCOUNTER — Telehealth: Payer: Self-pay | Admitting: Internal Medicine

## 2017-05-14 DIAGNOSIS — I739 Peripheral vascular disease, unspecified: Secondary | ICD-10-CM | POA: Diagnosis not present

## 2017-05-14 DIAGNOSIS — Z789 Other specified health status: Secondary | ICD-10-CM | POA: Insufficient documentation

## 2017-05-14 LAB — PLATELET INHIBITION P2Y12: PLATELET FUNCTION P2Y12: 312 [PRU] (ref 194–418)

## 2017-05-17 MED ORDER — GLUCOSE BLOOD VI STRP: 1.0000 | ORAL_STRIP | Freq: Four times a day (QID) | 1 refills | Status: DC

## 2017-05-17 NOTE — Addendum Note (Signed)
Addended by: Earnstine Regal on: 05/17/2017 01:59 PM   Modules accepted: Orders

## 2017-05-17 NOTE — Telephone Encounter (Signed)
Resent rx to [facility]...Johny Chess

## 2017-05-17 NOTE — Telephone Encounter (Signed)
Maggie Font called back in regard to this.  States that the script to mail order was cancelled bc patient wants to get this script filled with them.  States can not transfer this script from mail order.  Needs script with diagnosis code on it to fill for patient.

## 2017-05-18 ENCOUNTER — Telehealth: Payer: Self-pay | Admitting: Cardiovascular Disease

## 2017-05-18 NOTE — Telephone Encounter (Signed)
Returned call to patient's wife.P2y12 lab results given.

## 2017-05-18 NOTE — Telephone Encounter (Signed)
New Message ° ° pt wife verbalized that she is returning call for rn °

## 2017-05-21 NOTE — Progress Notes (Signed)
Cardiology Office Note:    Date:  05/24/2017   ID:  Michael [name], DOB 12-01-1940, MRN [id number]  PCP:  Cassandria Anger, MD  Cardiologist:  Dr. Jenkins Rouge  PV: Dr. Kathlyn Sacramento   Electrophysiologist:  n/a  Referring MD: Cassandria Anger, MD   No chief complaint on file.   History of Present Illness:    Michael [name] is a 76 y.o. male, previously followed by Dr. Darlin Coco, with a hx of CAD, carotid artery disease, paroxysmal AF, HTN, HL, DM, prior GI bleed (small bowel AVM).   He was admitted  in 4/16 with AF with RVR c/b congestive heart failure.  He was placed on Amiodarone and converted to NSR.  He had elevated Troponin levels again and a LHC demonstrated mild to mod CAD in the Dx, LCx, OM.  There was also innominate artery stenosis of borderline significance.  A follow up CTA demonstrated 50% R subclavian artery stenosis.  He saw Dr. Kathlyn Sacramento in 11/16 and his R arm symptoms were felt to mainly related to psoriatic arthritis and no vascular intervention was felt to be necessary.  First seen by me May 2017    Admitted 1/1-08/04/16  with a non-STEMI. Cardiac catheterization demonstrated 80% hazy stenosis involving the proximal OM1 felt to be the culprit lesion. This was treated with a Synergy DES. There was mild to moderate nonobstructive disease in the diagonal, LAD, AV groove circumflex and RCA. He had mildly elevated left and right heart filling pressures as well as mild pulmonary hypertension. Triple therapy was recommended with aspirin, clopidogrel and apixaban 1 month. After 30 days, aspirin could be discontinued. His creatinine increased after cardiac catheterization. Echocardiogram demonstrated normal ejection fraction, moderate diastolic dysfunction, moderate aortic stenosis and moderate pulmonary hypertension. His hospital course complicated by left wrist cellulitis with MSSA bacteremia requiring IV antibiotics as well as diarrhea with suspected C. difficile  (toxin was negative).  Readmitted 1/15-1/21/18  with acute on chronic diastolic CHF in the setting of worsening (heme+) anemia.  He required transfusion with PRBCs x 1.  His Eliquis was held. He developed AKI with diuresis. Capsule endoscopy by gastroenterology demonstrated multiple small AVMs in the mid small bowel which was the likely source of his anemia. He was continued on dual antiplatelet therapy given recent PCI with DES the setting of ACS. Eliquis was held at discharge.  Discharge creatinine 1.75. Discharge Hgb 8.9. Discharge weight 249 (-26 pounds).  More LE edema and abdominal bloating   CHADS2-VASc=5 (CHF, vascular dz, HTN, DM, > 37 yo).   Prior CV studies that were reviewed today include:    [facility] 07/29/16 LAD proximal 40, mid 10, D1 60 LCx proximal 30, distal 30, OM1 80 RA mean 9, RV 43/11, PA 44/12, mean 26; PCWP 18 AV mean gradient 12, calculated AVA 1.78 cm PCI: 2.25 x 38 mm Synergy DES to the OM1  Echo 08/01/16 Severe LVH, EF 60-65, normal wall motion, grade 2 diastolic dysfunction, moderate aortic stenosis (mean 20, peak 37), MAC, mild LAE, PASP 53  Echo 12/17 Moderate concentric LVH, EF 60-65, normal wall motion, grade 1 diastolic dysfunction, moderate aortic stenosis (mean 26), mild LAE, mild TR, PASP 52  Carotid US 5/17 Bilateral ICA 40-59, mild right subclavian steal Follow-up 1 year  LHC 4/16 LM:  widely patent. LAD:  widely patent; D1 50-70%  LCx:  Patent.  OM2 proximal eccentric 50-70%; mid CFX after origin of OM2 50% RI: widely patent. RCA:  widely patent.[name]  INNOMINATE ANGIOGRAPHY: There is heavy calcification with at least 50% obstruction in the innominate artery LV gram EF 65%..  Echo 3/16 Mild LVH, EF 55-60, mild LAE  Myoview 3/16 No ischemia or scar, EF 49.  Intermediate Risk  Past Medical History:  Diagnosis Date  . Acute on chronic heart failure ([place])   . Allergy   . Arthritis   . AVM (arteriovenous malformation) of colon   . C. difficile  enteritis   . CAD (coronary artery disease)    a. Cath 10/2014 - Mild to moderate diagonal, circumflex and obtuse marginal disease. Innominate artery sternosis.  . Carotid artery disease ([place])    a. Carotid dopple 03/2014 63-87% RICA &  56-43% LICA stenosis b. 3/29  . CKD (chronic kidney disease), stage III ([place])   . Clostridium difficile colitis 05/13/2016  . Dyspnea   . Dysrhythmia    ATRIAL FIBRILATION  . Elevated troponin    a. 09/2014 in setting of AF RVR-->Myoview: EF 49%, no ischemia/infarct->Med Rx.  . GERD (gastroesophageal reflux disease)   . Gout   . H/O transfusion of whole blood   . Heart murmur   . History of PFTs    PFTs 6/16:  FVC 3.73 (87%), FEV1 2.93 (88%), FEV1/FVC 78%, DLCO 69%  . Hx of adenomatous colonic polyps 07/02/2016  . Hyperlipidemia   . Hypertension   . Hypothyroidism   . Iron deficiency anemia   . PAF (paroxysmal atrial fibrillation) ([place])    a. 09/2014: Converted on Dilt;  b. CHA2DS2VASc = 3-->eliquis;  c. 09/2014 Echo: EF 55-60%, mild LVH, mildly dil LA.  [name] Personal history of colonic polyps 2007, 2008   adenoma each time, largest 12 mm in 2007  . Psoriasis   . Staph infection 07/2016   left hand   . Staphylococcus aureus bacteremia 07/31/2016  . Type II diabetes mellitus ([place])    TYPE 2    Past Surgical History:  Procedure Laterality Date  . APPENDECTOMY  02/09/2014  . CARDIAC CATHETERIZATION N/A 07/29/2016   Procedure: Right/Left Heart Cath and Coronary Angiography;  Surgeon: Nelva Bush, MD;  Location: [facility];  Service: Cardiovascular;  Laterality: N/A;  . CARDIAC CATHETERIZATION N/A 07/29/2016   Procedure: Coronary Stent Intervention;  Surgeon: Nelva Bush, MD;  Location: [facility];  Service: Cardiovascular;  Laterality: N/A;  . COLONOSCOPY  02/10/2011   internal hemorrhoids  . COLONOSCOPY W/ POLYPECTOMY  04/09/2006   12 mm adenoma  . COLONOSCOPY W/ POLYPECTOMY  06/17/2007   5 mm adenoma  . COLONOSCOPY WITH PROPOFOL  N/A 05/13/2016   Procedure: COLONOSCOPY WITH PROPOFOL;  Surgeon: Manus Gunning, MD;  Location: [facility];  Service: Gastroenterology;  Laterality: N/A;  . CORONARY STENT PLACEMENT  07/29/2016   OMI    DES  . ENTEROSCOPY N/A 08/14/2016   Procedure: ENTEROSCOPY;  Surgeon: Manus Gunning, MD;  Location[facility]COPY;  Service: Gastroenterology;  Laterality: N/A;  . ESOPHAGOGASTRODUODENOSCOPY  12/23/2011   Procedure: ESOPHAGOGASTRODUODENOSCOPY (EGD);  Surgeon: Irene Shipper, MD;  Location: [facility];  Service: Endoscopy;  Laterality: N/A;  with small bowel bx's  . GIVENS CAPSULE STUDY  12/28/2011  . GIVENS CAPSULE STUDY N/A 08/14/2016   Procedure: GIVENS CAPSULE STUDY;  Surgeon: Manus Gunning, MD;  Location: [facility];  Service: Gastroenterology;  Laterality: N/A;  . KNEE ARTHROSCOPY Right   . LAPAROSCOPIC APPENDECTOMY N/A 02/09/2014   Procedure: APPENDECTOMY LAPAROSCOPIC;  Surgeon: Leighton Ruff, MD;  Location: [facility];  Service: General;  Laterality: N/A;  . LEFT HEART CATHETERIZATION WITH CORONARY ANGIOGRAM N/A 11/15/2014   Procedure: LEFT HEART CATHETERIZATION WITH CORONARY ANGIOGRAM;  Surgeon: Belva Crome, MD;  Location: [facility] C[facility] LAB;  Service: Cardiovascular;  Laterality: N/A;  . ROTATOR CUFF REPAIR     left    Current Medications: Current Meds  Medication Sig  . allopurinol (ZYLOPRIM) 100 MG tablet Take 2 tablets (200 mg total) by mouth daily.  [name] aspirin 81 MG chewable tablet Chew 1 tablet (81 mg total) by mouth daily.  [name] atorvastatin (LIPITOR) 40 MG tablet Take 1 tablet (40 mg total) by mouth daily.  [name] b complex vitamins tablet Take 1 tablet by mouth daily.  . Blood Glucose Monitoring Suppl (ONE TOUCH ULTRA 2) W/DEVICE KIT Use as directed Dx E11.9  . Blood Pressure Monitor KIT Use to check blood pressure daily Dx I10  . Cholecalciferol 1000 UNITS TBDP Take 1,000-5,000 Units by mouth daily. Takes 1000 units everyday except for on Saturday patient takes  5000 units  . clopidogrel (PLAVIX) 75 MG tablet Take 1 tablet (75 mg total) by mouth daily with breakfast.  . diltiazem (CARTIA XT) 180 MG 24 hr capsule Take 1 capsule (180 mg total) by mouth daily.  . fenofibrate 160 MG tablet TAKE 1 TABLET(160 MG) BY MOUTH DAILY  . ferrous sulfate 325 (65 FE) MG tablet Take 325 mg by mouth 3 (three) times daily with meals.  . furosemide (LASIX) 20 MG tablet Take 2 tablets (40 mg total) by mouth daily.  [name] gabapentin (NEURONTIN) 100 MG capsule Take 1 capsule (100 mg total) by mouth 2 (two) times daily.  [name] glipiZIDE (GLUCOTROL) 10 MG tablet Take 1 tablet (10 mg total) by mouth 2 (two) times daily before a meal.  . glucose blood (ONE TOUCH ULTRA TEST) test strip 1 each by Other route 4 (four) times daily. Use to check blood sugar four times a day  Dx: E11.9- insulin Dependant  . hydrALAZINE (APRESOLINE) 25 MG tablet Take 1 tablet (25 mg total) by mouth 3 (three) times daily.  [name] HYDROcodone-acetaminophen (NORCO/VICODIN) 5-325 MG tablet Take 1 tablet by mouth 2 (two) times daily as needed for moderate pain.  . Insulin Glargine (TOUJEO SOLOSTAR) 300 UNIT/ML SOPN Inject 52 Units into the skin daily.  . Insulin Pen Needle 31G X 5 MM MISC 1 Units by Does not apply route 4 (four) times daily.  . Lancets (ONETOUCH ULTRASOFT) lancets Use to check blood sugars daily  . levothyroxine (SYNTHROID, LEVOTHROID) 150 MCG tablet take 1 tablet by mouth daily BEFORE BREAKFAST  . metoprolol tartrate (LOPRESSOR) 25 MG tablet Take 3 tablets (75 mg total) by mouth 2 (two) times daily.  [name] omeprazole (PRILOSEC) 40 MG capsule Take 1 capsule (40 mg total) by mouth daily.  . potassium chloride SA (K-DUR,KLOR-CON) 20 MEQ tablet Take 1 tablet (20 mEq total) by mouth daily.     Allergies:   Percocet [oxycodone-acetaminophen]; Invokana [canagliflozin]; and Metformin and related   Social History   Social History  . Marital status: Married    Spouse name: N/A  . Number of children: N/A  .  Years of education: 62   Occupational History  .  Retired    Social History Main Topics  . Smoking status: Former Smoker    Packs/day: 0.70    Years: 48.00    Types: Cigarettes    Quit date: 03/29/2006  . Smokeless tobacco: Never Used  . Alcohol use 0.6 oz/week    1 Cans of  beer per week     Comment: occasional alcohol intake  . Drug use: No  . Sexual activity: Not Currently   Other Topics Concern  . None   Social History Narrative   Regular exercise-no   Caffeine Use-yes     Family History:  The patient's family history includes COPD in his sister; Cancer in his brother; Diabetes in his father; Heart attack in his father; Heart disease in his brother; Hypertension in his father and mother; Lung cancer in his father; Stroke in his father and mother.   ROS:   Please see the history of present illness.    ROS All other systems reviewed and are negative.   EKGs/Labs/Other Test Reviewed:    EKG:  03/01/17 Sinus bradycardia, HR 53, normal axis, Q-wave in lead 3, QTc 439 ms, no significant changes.   Recent Labs: 07/27/2016: TSH 4.452 08/10/2016: ALT 9 02/07/2017: B Natriuretic Peptide 351.5 02/10/2017: BUN 44; Creatinine, Ser 2.01; Hemoglobin 10.4; Platelets 157.0; Potassium 4.8; Sodium 136   Recent Lipid Panel    Component Value Date/Time   CHOL 101 08/01/2016 0440   TRIG 344 (H) 08/01/2016 0440   HDL 22 (L) 08/01/2016 0440   CHOLHDL 4.6 08/01/2016 0440   VLDL 69 (H) 08/01/2016 0440   LDLCALC 10 08/01/2016 0440   LDLDIRECT 26.0 03/20/2015 1003     Physical Exam:    VS:  BP (!) 128/54   Pulse 75   Ht _0  (1.803 m)   Wt 254 lb 12 oz (115.6 kg)   SpO2 97%   BMI 35.53 kg/m     Wt Readings from Last 3 Encounters:  05/24/17 254 lb 12 oz (115.6 kg)  04/14/17 250 lb (113.4 kg)  03/01/17 243 lb (110.2 kg)     Physical Exam  Constitutional: He is oriented to person, place, and time. No distress.  Obese white male  HENT:  Head: Normocephalic and atraumatic.    Eyes: No scleral icterus.  Neck: No JVD present.  Cardiovascular: Normal rate and regular rhythm.   Murmur heard.  Harsh systolic murmur is present with a grade of 2/6  at the upper right sternal border AS murmur   Pulmonary/Chest: Effort normal. He has no wheezes. He has no rales.  Abdominal: He exhibits no distension. There is no tenderness.  Musculoskeletal: He exhibits edema.  Plus 2 bilateral   Neurological: He is alert and oriented to person, place, and time.  Skin: Skin is warm and dry.  Psychiatric: He has a normal mood and affect.    ASSESSMENT:    1. Medication management   2. Essential hypertension    PLAN:    In order of problems listed above:  1. CAD - status post non-STEMI treated with DES to the OM1 07/29/16 . He had mild to moderate residual CAD in the LAD and LCx at catheterization.  He remains on dual antiplatelet therapy with aspirin and Plavix until January 2019. Continue statin, beta blocker. Needs SL nitro called in   2. Chronic diastolic CHF - Volume overloaded increase lasix to bid   3. Persistent AF - He maintains normal sinus rhythm on amiodarone. Unfortunately, Eliquis had to be discontinued in the setting of GI bleed. He has significant risk factors for stroke and would benefit from anticoagulation.  Will see in January and likely start eliquis 2.5 bid with plavix   4. History of GI bleed - This was in the setting of multiple small AVMs in the small bowel. Follow-up  with GI as planned. Endo 08/19/16 with multiple small bowel AVM's Needs DAT for a year given DES to OM in January Should not be on triple Rx January will consider eliquis and plavix    5. HTN - Controlled.  6. HL - LDL was 10 in 1/18. Continue statin.  7. R Subclavian stenosis - Follow-up with Dr. Fletcher Anon as planned.    8. Aortic stenosis - Moderate by recent echocardiogram 03/30/17 f/u in a year Mean gradient 25 mmHg peak 39 mmHg  .  9. History of cellulitis - He had left wrist cellulitis  during his first admission. He continues to have pain. He missed his follow-up with ID due to recurrent admission. Arrange follow-up with ID.  10. CKD -  Needs more diuretic for edema and abdominal swelling BMET in 4 weeks  Lab Results  Component Value Date   CREATININE 2.01 (H) 02/10/2017   BUN 44 (H) 02/10/2017   NA 136 02/10/2017   K 4.8 02/10/2017   CL 104 02/10/2017   CO2 26 02/10/2017      Medication Adjustments/Labs and Tests Ordered: Current medicines are reviewed at length with the patient today.  Concerns regarding medicines are outlined above.  Medication changes, Labs and Tests ordered today are outlined in the Patient Instructions noted below. Patient Instructions  Medication Instructions:  Your physician has recommended you make the following change in your medication:  1- START Lasix 40 mg by mouth twice daily  Labwork: Your physician recommends that you return for lab work in: 4 weeks for BMET  Testing/Procedures: NONE  Follow-Up: Your physician wants you to follow-up in January or February with Dr. Johnsie Cancel. .   If you need a refill on your cardiac medications before your next appointment, please call your pharmacy.   Signed, Jenkins Rouge, MD  05/24/2017 10:14 AM    [facility] [address], [place], [place]  [address] Phone: [phone]; Fax: [phone]

## 2017-05-24 ENCOUNTER — Telehealth: Payer: Self-pay

## 2017-05-24 ENCOUNTER — Other Ambulatory Visit (INDEPENDENT_AMBULATORY_CARE_PROVIDER_SITE_OTHER): Payer: Medicare Other

## 2017-05-24 ENCOUNTER — Other Ambulatory Visit: Payer: Self-pay

## 2017-05-24 ENCOUNTER — Ambulatory Visit (INDEPENDENT_AMBULATORY_CARE_PROVIDER_SITE_OTHER): Payer: Medicare Other | Admitting: Cardiovascular Disease

## 2017-05-24 ENCOUNTER — Encounter: Payer: Self-pay | Admitting: Cardiovascular Disease

## 2017-05-24 ENCOUNTER — Ambulatory Visit: Payer: Medicare Other | Admitting: Cardiovascular Disease

## 2017-05-24 VITALS — BP 128/54 | HR 75 | Ht 71.0 in | Wt 254.8 lb

## 2017-05-24 DIAGNOSIS — I6523 Occlusion and stenosis of bilateral carotid arteries: Secondary | ICD-10-CM | POA: Diagnosis not present

## 2017-05-24 DIAGNOSIS — Z79899 Other long term (current) drug therapy: Secondary | ICD-10-CM | POA: Diagnosis not present

## 2017-05-24 DIAGNOSIS — I10 Essential (primary) hypertension: Secondary | ICD-10-CM | POA: Diagnosis not present

## 2017-05-24 DIAGNOSIS — D649 Anemia, unspecified: Secondary | ICD-10-CM

## 2017-05-24 LAB — CBC WITH DIFFERENTIAL/PLATELET
BASOS PCT: 0.9 % (ref 0.0–3.0)
Basophils Absolute: 0 10*3/uL (ref 0.0–0.1)
EOS PCT: 2.2 % (ref 0.0–5.0)
Eosinophils Absolute: 0.1 10*3/uL (ref 0.0–0.7)
HEMATOCRIT: 31.6 % — AB (ref 39.0–52.0)
Hemoglobin: 11.4 g/dL — ABNORMAL LOW (ref 13.0–17.0)
LYMPHS PCT: 13.5 % (ref 12.0–46.0)
Lymphs Abs: 0.6 10*3/uL — ABNORMAL LOW (ref 0.7–4.0)
MCHC: 36 g/dL (ref 30.0–36.0)
MCV: 87.5 fl (ref 78.0–100.0)
MONOS PCT: 6.2 % (ref 3.0–12.0)
Monocytes Absolute: 0.3 10*3/uL (ref 0.1–1.0)
NEUTROS ABS: 3.4 10*3/uL (ref 1.4–7.7)
Neutrophils Relative %: 77.2 % — ABNORMAL HIGH (ref 43.0–77.0)
PLATELETS: 171 10*3/uL (ref 150.0–400.0)
RBC: 3.62 Mil/uL — ABNORMAL LOW (ref 4.22–5.81)
RDW: 16.2 % — AB (ref 11.5–15.5)
WBC: 4.4 10*3/uL (ref 4.0–10.5)

## 2017-05-24 MED ORDER — NITROGLYCERIN 0.4 MG SL SUBL: 0.4000 mg | SUBLINGUAL_TABLET | SUBLINGUAL | 3 refills | Status: DC | PRN

## 2017-05-24 MED ORDER — FENOFIBRATE 160 MG PO TABS: ORAL_TABLET | ORAL | 3 refills | Status: DC

## 2017-05-24 MED ORDER — FUROSEMIDE 40 MG PO TABS: 40.0000 mg | ORAL_TABLET | Freq: Two times a day (BID) | ORAL | 3 refills | Status: DC

## 2017-05-24 NOTE — Patient Instructions (Addendum)
Medication Instructions:  Your physician has recommended you make the following change in your medication:  1- START Lasix 40 mg by mouth twice daily 2- Take Nitroglycerin Take 1 NTG, under your tongue, while sitting. If no relief of pain may repeat NTG, one tab every 5 minutes up to 3 tablets total over 15 minutes. If no relief CALL 911. If you have dizziness/lightheadness while taking NTG, stop taking and call 911.  Labwork: Your physician recommends that you return for lab work in: 4 weeks for BMET  Testing/Procedures: NONE  Follow-Up: Your physician wants you to follow-up in January or February with Dr. Johnsie Cancel. .   If you need a refill on your cardiac medications before your next appointment, please call your pharmacy.

## 2017-05-24 NOTE — Telephone Encounter (Signed)
Called patient's wife back to informed him of medication nitroglycerin and how to use it. Patient's wife verbalized understanding.

## 2017-05-24 NOTE — Telephone Encounter (Signed)
Left message for patient to call back  

## 2017-05-24 NOTE — Addendum Note (Signed)
Addended by: Aris Georgia, Dwanna Goshert L on: 05/24/2017 10:54 AM   Modules accepted: Orders

## 2017-05-24 NOTE — Telephone Encounter (Signed)
Follow up     Patient wife returning call. Please call

## 2017-05-27 ENCOUNTER — Other Ambulatory Visit: Payer: Self-pay | Admitting: *Deleted

## 2017-05-27 ENCOUNTER — Other Ambulatory Visit: Payer: Self-pay

## 2017-05-27 DIAGNOSIS — D649 Anemia, unspecified: Secondary | ICD-10-CM

## 2017-05-27 MED ORDER — NITROGLYCERIN 0.4 MG SL SUBL: 0.4000 mg | SUBLINGUAL_TABLET | SUBLINGUAL | 0 refills | Status: DC | PRN

## 2017-05-27 NOTE — Progress Notes (Signed)
Hbg a little better !  Repeat in 3 mos

## 2017-05-27 NOTE — Telephone Encounter (Signed)
Representative from [facility] left a msg on the refill vm requesting that the rx for nitro be changed to ninety days. She stated that nitro only comes in #25 or #100 and they are requesting #100. Typically a patients co-pay through mail order, if they have one, is the same for thirty days as it is for ninety. I will update rx and resend.

## 2017-05-31 DIAGNOSIS — L409 Psoriasis, unspecified: Secondary | ICD-10-CM | POA: Diagnosis not present

## 2017-05-31 DIAGNOSIS — L400 Psoriasis vulgaris: Secondary | ICD-10-CM | POA: Diagnosis not present

## 2017-06-21 ENCOUNTER — Other Ambulatory Visit: Payer: Medicare Other | Admitting: *Deleted

## 2017-06-21 DIAGNOSIS — Z79899 Other long term (current) drug therapy: Secondary | ICD-10-CM | POA: Diagnosis not present

## 2017-06-21 DIAGNOSIS — I10 Essential (primary) hypertension: Secondary | ICD-10-CM

## 2017-06-21 LAB — BASIC METABOLIC PANEL
BUN/Creatinine Ratio: 21 (ref 10–24)
BUN: 37 mg/dL — ABNORMAL HIGH (ref 8–27)
CALCIUM: 9 mg/dL (ref 8.6–10.2)
CO2: 20 mmol/L (ref 20–29)
CREATININE: 1.75 mg/dL — AB (ref 0.76–1.27)
Chloride: 96 mmol/L (ref 96–106)
GFR calc Af Amer: 43 mL/min/{1.73_m2} — ABNORMAL LOW (ref >59)
GFR, EST NON AFRICAN AMERICAN: 38 mL/min/{1.73_m2} — AB (ref >59)
Glucose: 444 mg/dL — ABNORMAL HIGH (ref 65–99)
Potassium: 4.8 mmol/L (ref 3.5–5.2)
Sodium: 132 mmol/L — ABNORMAL LOW (ref 134–144)

## 2017-06-22 ENCOUNTER — Encounter: Payer: Self-pay | Admitting: Cardiovascular Disease

## 2017-06-22 ENCOUNTER — Ambulatory Visit (INDEPENDENT_AMBULATORY_CARE_PROVIDER_SITE_OTHER): Payer: Medicare Other | Admitting: Cardiovascular Disease

## 2017-06-22 VITALS — BP 148/60 | HR 60 | Ht 71.0 in | Wt 255.4 lb

## 2017-06-22 DIAGNOSIS — I2510 Atherosclerotic heart disease of native coronary artery without angina pectoris: Secondary | ICD-10-CM

## 2017-06-22 DIAGNOSIS — I480 Paroxysmal atrial fibrillation: Secondary | ICD-10-CM

## 2017-06-22 DIAGNOSIS — I771 Stricture of artery: Secondary | ICD-10-CM

## 2017-06-22 DIAGNOSIS — E785 Hyperlipidemia, unspecified: Secondary | ICD-10-CM | POA: Diagnosis not present

## 2017-06-22 DIAGNOSIS — I739 Peripheral vascular disease, unspecified: Secondary | ICD-10-CM

## 2017-06-22 DIAGNOSIS — I779 Disorder of arteries and arterioles, unspecified: Secondary | ICD-10-CM | POA: Diagnosis not present

## 2017-06-22 DIAGNOSIS — I6523 Occlusion and stenosis of bilateral carotid arteries: Secondary | ICD-10-CM

## 2017-06-22 NOTE — Patient Instructions (Signed)

## 2017-06-22 NOTE — Progress Notes (Signed)
Cardiology Office Note   Date:  06/22/2017   ID:  Kelii, Chittum 03/29/1941, MRN [id number]  PCP:  Cassandria Anger, MD  Cardiologist:  Dr. Johnsie Cancel PV:  Kathlyn Sacramento, MD   No chief complaint on file.     History of Present Illness: Michael [name] is a 76 y.o. male who presents for a follow-up visit regarding right subclavian artery stenosis.   He has known hx of PAF, HTN, HL, diabetes, prior GI bleed (2/2 small bowel AVM) moderate AS and  CAD.  He had cardiac catheterization in April of 2016 which demonstrated mild to moderate diagonal, circumflex and OM disease, normal LV function and borderline significant innominate artery stenosis.  The catheterization was done via the right radial artery in the patient was noted to have calcified stenosis involving the right innominate artery. He is known to have moderate bilateral carotid disease.   He is not to have significant right innominate artery and subclavian artery stenosis which is being treated medically given lack of arm claudication or symptoms related to this.  He had non-ST elevation myocardial infarction in March 2018.  He underwent successful angioplasty and drug-eluting stent placement to OM1. He has been doing reasonably well overall.  No chest pain.  He reports stable dyspnea.  No arm claudication.   Past Medical History:  Diagnosis Date  . Acute on chronic heart failure ([place])   . Allergy   . Arthritis   . AVM (arteriovenous malformation) of colon   . C. difficile enteritis   . CAD (coronary artery disease)    a. Cath 10/2014 - Mild to moderate diagonal, circumflex and obtuse marginal disease. Innominate artery sternosis.  . Carotid artery disease ([place])    a. Carotid dopple 03/2014 46-65% RICA &  99-35% LICA stenosis b. 7/01  . CKD (chronic kidney disease), stage III ([place])   . Clostridium difficile colitis 05/13/2016  . Dyspnea   . Dysrhythmia    ATRIAL FIBRILATION  . Elevated troponin    a. 09/2014 in  setting of AF RVR-->Myoview: EF 49%, no ischemia/infarct->Med Rx.  . GERD (gastroesophageal reflux disease)   . Gout   . H/O transfusion of whole blood   . Heart murmur   . History of PFTs    PFTs 6/16:  FVC 3.73 (87%), FEV1 2.93 (88%), FEV1/FVC 78%, DLCO 69%  . Hx of adenomatous colonic polyps 07/02/2016  . Hyperlipidemia   . Hypertension   . Hypothyroidism   . Iron deficiency anemia   . PAF (paroxysmal atrial fibrillation) ([place])    a. 09/2014: Converted on Dilt;  b. CHA2DS2VASc = 3-->eliquis;  c. 09/2014 Echo: EF 55-60%, mild LVH, mildly dil LA.  [name] Personal history of colonic polyps 2007, 2008   adenoma each time, largest 12 mm in 2007  . Psoriasis   . Staph infection 07/2016   left hand   . Staphylococcus aureus bacteremia 07/31/2016  . Type II diabetes mellitus ([place])    TYPE 2    Past Surgical History:  Procedure Laterality Date  . APPENDECTOMY  02/09/2014  . CARDIAC CATHETERIZATION N/A 07/29/2016   Procedure: Right/Left Heart Cath and Coronary Angiography;  Surgeon: Nelva Bush, MD;  Location: [place];  Service: Cardiovascular;  Laterality: N/A;  . CARDIAC CATHETERIZATION N/A 07/29/2016   Procedure: Coronary Stent Intervention;  Surgeon: Nelva Bush, MD;  Location: [facility];  Service: Cardiovascular;  Laterality: N/A;  . COLONOSCOPY  02/10/2011   internal hemorrhoids  .  COLONOSCOPY W/ POLYPECTOMY  04/09/2006   12 mm adenoma  . COLONOSCOPY W/ POLYPECTOMY  06/17/2007   5 mm adenoma  . COLONOSCOPY WITH PROPOFOL N/A 05/13/2016   Procedure: COLONOSCOPY WITH PROPOFOL;  Surgeon: Manus Gunning, MD;  Location: [facility];  Service: Gastroenterology;  Laterality: N/A;  . CORONARY STENT PLACEMENT  07/29/2016   OMI    DES  . ENTEROSCOPY N/A 08/14/2016   Procedure: ENTEROSCOPY;  Surgeon: Manus Gunning, MD;  Location: [facility];  Service: Gastroenterology;  Laterality: N/A;  . ESOPHAGOGASTRODUODENOSCOPY  12/23/2011   Procedure:  ESOPHAGOGASTRODUODENOSCOPY (EGD);  Surgeon: Irene Shipper, MD;  Location: [facility];  Service: Endoscopy;  Laterality: N/A;  with small bowel bx's  . GIVENS CAPSULE STUDY  12/28/2011  . GIVENS CAPSULE STUDY N/A 08/14/2016   Procedure: GIVENS CAPSULE STUDY;  Surgeon: Manus Gunning, MD;  Location: [facility];  Service: Gastroenterology;  Laterality: N/A;  . KNEE ARTHROSCOPY Right   . LAPAROSCOPIC APPENDECTOMY N/A 02/09/2014   Procedure: APPENDECTOMY LAPAROSCOPIC;  Surgeon: Leighton Ruff, MD;  Location: [facility];  Service: General;  Laterality: N/A;  . LEFT HEART CATHETERIZATION WITH CORONARY ANGIOGRAM N/A 11/15/2014   Procedure: LEFT HEART CATHETERIZATION WITH CORONARY ANGIOGRAM;  Surgeon: Belva Crome, MD;  Location: [facility] C[facility];  Service: Cardiovascular;  Laterality: N/A;  . ROTATOR CUFF REPAIR     left     Current Outpatient Medications  Medication Sig Dispense Refill  . allopurinol (ZYLOPRIM) 100 MG tablet Take 2 tablets (200 mg total) by mouth daily. 180 tablet 1  . aspirin 81 MG chewable tablet Chew 1 tablet (81 mg total) by mouth daily. 90 tablet 1  . atorvastatin (LIPITOR) 40 MG tablet Take 1 tablet (40 mg total) by mouth daily. 90 tablet 1  . b complex vitamins tablet Take 1 tablet by mouth daily. 100 tablet 3  . Blood Glucose Monitoring Suppl (ONE TOUCH ULTRA 2) W/DEVICE KIT Use as directed Dx E11.9 1 each 0  . Blood Pressure Monitor KIT Use to check blood pressure daily Dx I10 1 each 0  . Cholecalciferol 1000 UNITS TBDP Take 1,000-5,000 Units by mouth daily. Takes 1000 units everyday except for on Saturday patient takes 5000 units    . clopidogrel (PLAVIX) 75 MG tablet Take 1 tablet (75 mg total) by mouth daily with breakfast. 90 tablet 1  . diltiazem (CARTIA XT) 180 MG 24 hr capsule Take 1 capsule (180 mg total) by mouth daily. 90 capsule 0  . fenofibrate 160 MG tablet TAKE 1 TABLET(160 MG) BY MOUTH DAILY 90 tablet 3  . ferrous sulfate 325 (65 FE) MG tablet Take 325 mg by  mouth 3 (three) times daily with meals.    . furosemide (LASIX) 40 MG tablet Take 1 tablet (40 mg total) by mouth 2 (two) times daily. 180 tablet 3  . gabapentin (NEURONTIN) 100 MG capsule Take 1 capsule (100 mg total) by mouth 2 (two) times daily. 180 capsule 1  . glipiZIDE (GLUCOTROL) 10 MG tablet Take 1 tablet (10 mg total) by mouth 2 (two) times daily before a meal. 180 tablet 1  . glucose blood (ONE TOUCH ULTRA TEST) test strip 1 each by Other route 4 (four) times daily. Use to check blood sugar four times a day  Dx: E11.9- insulin Dependant 360 each 1  . hydrALAZINE (APRESOLINE) 25 MG tablet Take 1 tablet (25 mg total) by mouth 3 (three) times daily. 270 tablet 1  . HYDROcodone-acetaminophen (NORCO/VICODIN) 5-325 MG tablet Take  1 tablet by mouth 2 (two) times daily as needed for moderate pain. 60 tablet 0  . Insulin Glargine (TOUJEO SOLOSTAR) 300 UNIT/ML SOPN Inject 52 Units into the skin daily. 9 mL 5  . Insulin Pen Needle 31G X 5 MM MISC 1 Units by Does not apply route 4 (four) times daily. 150 each 11  . Lancets (ONETOUCH ULTRASOFT) lancets Use to check blood sugars daily 30 each 11  . levothyroxine (SYNTHROID, LEVOTHROID) 150 MCG tablet take 1 tablet by mouth daily BEFORE BREAKFAST 90 tablet 1  . metoprolol tartrate (LOPRESSOR) 25 MG tablet Take 3 tablets (75 mg total) by mouth 2 (two) times daily. 540 tablet 1  . nitroGLYCERIN (NITROSTAT) 0.4 MG SL tablet Place 1 tablet (0.4 mg total) under the tongue every 5 (five) minutes as needed for chest pain. 100 tablet 0  . omeprazole (PRILOSEC) 40 MG capsule Take 1 capsule (40 mg total) by mouth daily. 90 capsule 1  . potassium chloride SA (K-DUR,KLOR-CON) 20 MEQ tablet Take 1 tablet (20 mEq total) by mouth daily. 90 tablet 1   No current facility-administered medications for this visit.     Allergies:   Percocet [oxycodone-acetaminophen]; Invokana [canagliflozin]; and Metformin and related    Social History:  The patient  reports that he  quit smoking about 11 years ago. His smoking use included cigarettes. He has a 33.60 pack-year smoking history. he has never used smokeless tobacco. He reports that he drinks about 0.6 oz of alcohol per week. He reports that he does not use drugs.  V to tolerate thickened  Family History:  The patient's family history includes COPD in his sister; Cancer in his brother; Diabetes in his father; Heart attack in his father; Heart disease in his brother; Hypertension in his father and mother; Lung cancer in his father; Stroke in his father and mother.    ROS:  Please see the history of present illness.   Otherwise, review of systems are positive for none.   All other systems are reviewed and negative.    PHYSICAL EXAM: VS:  BP (!) 148/60   Pulse 60   Ht 5' 11"  (1.803 m)   Wt 255 lb 6.4 oz (115.8 kg)   SpO2 95%   BMI 35.62 kg/m  , BMI Body mass index is 35.62 kg/m. GEN: Well nourished, well developed, in no acute distress  HEENT: normal  Neck: no JVD, carotid bruits, or masses Cardiac: RRR; no  rubs, or gallops,no edema . There is 3/6 crescendo decrescendo systolic murmur in the aortic area which is mid peaking with slightly diminished S2 Respiratory:  clear to auscultation bilaterally, normal work of breathing GI: soft, nontender, nondistended, + BS MS: no deformity or atrophy  Skin: warm and dry, no rash Neuro:  Strength and sensation are intact Psych: euthymic mood, full affect Vascular: Radial pulses +1 on the right and +2 on the left.  EKG:  EKG is not ordered today.    Recent Labs: 07/27/2016: TSH 4.452 08/10/2016: ALT 9 02/07/2017: B Natriuretic Peptide 351.5 05/24/2017: Hemoglobin 11.4; Platelets 171.0 06/21/2017: BUN 37; Creatinine, Ser 1.75; Potassium 4.8; Sodium 132    Lipid Panel    Component Value Date/Time   CHOL 101 08/01/2016 0440   TRIG 344 (H) 08/01/2016 0440   HDL 22 (L) 08/01/2016 0440   CHOLHDL 4.6 08/01/2016 0440   VLDL 69 (H) 08/01/2016 0440   LDLCALC 10  08/01/2016 0440   LDLDIRECT 26.0 03/20/2015 1003      Wt  Readings from Last 3 Encounters:  06/22/17 255 lb 6.4 oz (115.8 kg)  05/24/17 254 lb 12 oz (115.6 kg)  04/14/17 250 lb (113.4 kg)      No flowsheet data found.    ASSESSMENT AND PLAN:  1. Right subclavian stenosis: Appears to be asymptomatic. Continue medical therapy.  Right radial pulse is palpable.  2.  Coronary artery disease involving native coronary arteries without angina: Currently on dual antiplatelet therapy.  3. Paroxysmal atrial fibrillation: He seems to be in sinus rhythm.  Anticoagulation is currently on hold given that he is on dual antiplatelet therapy.  It has been almost 1 year since PCI and thus I think it is reasonable to discontinue Plavix and resume anticoagulation in the near future.  4. Bilateral carotid stenosis: Stable on most recent Doppler in June.  5.  Hyperlipidemia: Continue treatment with atorvastatin.  Disposition:   FU with me in 1 year  Signed,  Kathlyn Sacramento, MD  06/22/2017 11:45 AM    [place]

## 2017-06-23 ENCOUNTER — Telehealth: Payer: Self-pay | Admitting: Cardiovascular Disease

## 2017-06-23 NOTE — Telephone Encounter (Signed)
Follow Up: ° ° °Returning your call,concerning his lab results. °

## 2017-06-23 NOTE — Telephone Encounter (Signed)
Called patient's wife (DPR) back with lab results.

## 2017-06-24 ENCOUNTER — Observation Stay (HOSPITAL_COMMUNITY)
Admission: EM | Admit: 2017-06-24 | Discharge: 2017-06-26 | Disposition: A | Discharge status: Home / Self Care | Payer: Medicare Other | Attending: Internal Medicine | Admitting: Internal Medicine

## 2017-06-24 ENCOUNTER — Other Ambulatory Visit: Payer: Self-pay

## 2017-06-24 ENCOUNTER — Emergency Department (HOSPITAL_COMMUNITY): Payer: Medicare Other

## 2017-06-24 DIAGNOSIS — Z7982 Long term (current) use of aspirin: Secondary | ICD-10-CM | POA: Insufficient documentation

## 2017-06-24 DIAGNOSIS — Z7902 Long term (current) use of antithrombotics/antiplatelets: Secondary | ICD-10-CM | POA: Diagnosis not present

## 2017-06-24 DIAGNOSIS — I481 Persistent atrial fibrillation: Secondary | ICD-10-CM | POA: Insufficient documentation

## 2017-06-24 DIAGNOSIS — Z794 Long term (current) use of insulin: Secondary | ICD-10-CM | POA: Diagnosis not present

## 2017-06-24 DIAGNOSIS — I130 Hypertensive heart and chronic kidney disease with heart failure and stage 1 through stage 4 chronic kidney disease, or unspecified chronic kidney disease: Secondary | ICD-10-CM | POA: Insufficient documentation

## 2017-06-24 DIAGNOSIS — E785 Hyperlipidemia, unspecified: Secondary | ICD-10-CM | POA: Diagnosis not present

## 2017-06-24 DIAGNOSIS — F17210 Nicotine dependence, cigarettes, uncomplicated: Secondary | ICD-10-CM | POA: Diagnosis not present

## 2017-06-24 DIAGNOSIS — I10 Essential (primary) hypertension: Secondary | ICD-10-CM | POA: Diagnosis present

## 2017-06-24 DIAGNOSIS — Z955 Presence of coronary angioplasty implant and graft: Secondary | ICD-10-CM | POA: Insufficient documentation

## 2017-06-24 DIAGNOSIS — I480 Paroxysmal atrial fibrillation: Secondary | ICD-10-CM | POA: Diagnosis not present

## 2017-06-24 DIAGNOSIS — I252 Old myocardial infarction: Secondary | ICD-10-CM | POA: Insufficient documentation

## 2017-06-24 DIAGNOSIS — B0229 Other postherpetic nervous system involvement: Secondary | ICD-10-CM | POA: Diagnosis present

## 2017-06-24 DIAGNOSIS — Z6835 Body mass index (BMI) 35.0-35.9, adult: Secondary | ICD-10-CM | POA: Insufficient documentation

## 2017-06-24 DIAGNOSIS — E1122 Type 2 diabetes mellitus with diabetic chronic kidney disease: Secondary | ICD-10-CM | POA: Diagnosis not present

## 2017-06-24 DIAGNOSIS — G4733 Obstructive sleep apnea (adult) (pediatric): Secondary | ICD-10-CM | POA: Diagnosis not present

## 2017-06-24 DIAGNOSIS — I2510 Atherosclerotic heart disease of native coronary artery without angina pectoris: Secondary | ICD-10-CM | POA: Diagnosis not present

## 2017-06-24 DIAGNOSIS — E1151 Type 2 diabetes mellitus with diabetic peripheral angiopathy without gangrene: Secondary | ICD-10-CM | POA: Diagnosis not present

## 2017-06-24 DIAGNOSIS — K219 Gastro-esophageal reflux disease without esophagitis: Secondary | ICD-10-CM | POA: Diagnosis not present

## 2017-06-24 DIAGNOSIS — G2581 Restless legs syndrome: Secondary | ICD-10-CM | POA: Insufficient documentation

## 2017-06-24 DIAGNOSIS — E039 Hypothyroidism, unspecified: Secondary | ICD-10-CM | POA: Insufficient documentation

## 2017-06-24 DIAGNOSIS — Z79899 Other long term (current) drug therapy: Secondary | ICD-10-CM | POA: Insufficient documentation

## 2017-06-24 DIAGNOSIS — N183 Chronic kidney disease, stage 3 unspecified: Secondary | ICD-10-CM | POA: Diagnosis present

## 2017-06-24 DIAGNOSIS — I739 Peripheral vascular disease, unspecified: Secondary | ICD-10-CM | POA: Diagnosis present

## 2017-06-24 DIAGNOSIS — E1165 Type 2 diabetes mellitus with hyperglycemia: Secondary | ICD-10-CM | POA: Insufficient documentation

## 2017-06-24 DIAGNOSIS — Z885 Allergy status to narcotic agent status: Secondary | ICD-10-CM | POA: Insufficient documentation

## 2017-06-24 DIAGNOSIS — I5032 Chronic diastolic (congestive) heart failure: Secondary | ICD-10-CM | POA: Diagnosis present

## 2017-06-24 DIAGNOSIS — I6523 Occlusion and stenosis of bilateral carotid arteries: Secondary | ICD-10-CM | POA: Diagnosis not present

## 2017-06-24 DIAGNOSIS — N179 Acute kidney failure, unspecified: Secondary | ICD-10-CM | POA: Insufficient documentation

## 2017-06-24 DIAGNOSIS — I771 Stricture of artery: Secondary | ICD-10-CM | POA: Diagnosis present

## 2017-06-24 DIAGNOSIS — I4891 Unspecified atrial fibrillation: Secondary | ICD-10-CM | POA: Diagnosis present

## 2017-06-24 DIAGNOSIS — R079 Chest pain, unspecified: Secondary | ICD-10-CM | POA: Diagnosis not present

## 2017-06-24 HISTORY — DX: Unspecified atrial fibrillation: I48.91

## 2017-06-24 HISTORY — DX: Non-ST elevation (NSTEMI) myocardial infarction: I21.4

## 2017-06-24 LAB — BASIC METABOLIC PANEL
Anion gap: 10 (ref 5–15)
BUN: 36 mg/dL — AB (ref 6–20)
CHLORIDE: 100 mmol/L — AB (ref 101–111)
CO2: 21 mmol/L — AB (ref 22–32)
CREATININE: 1.83 mg/dL — AB (ref 0.61–1.24)
Calcium: 8.7 mg/dL — ABNORMAL LOW (ref 8.9–10.3)
GFR calc Af Amer: 40 mL/min — ABNORMAL LOW (ref >60)
GFR calc non Af Amer: 35 mL/min — ABNORMAL LOW (ref >60)
Glucose, Bld: 355 mg/dL — ABNORMAL HIGH (ref 65–99)
POTASSIUM: 5.3 mmol/L — AB (ref 3.5–5.1)
Sodium: 131 mmol/L — ABNORMAL LOW (ref 135–145)

## 2017-06-24 LAB — CBC WITH DIFFERENTIAL/PLATELET
BASOS ABS: 0 10*3/uL (ref 0.0–0.1)
BASOS PCT: 0 %
EOS ABS: 0.1 10*3/uL (ref 0.0–0.7)
EOS PCT: 2 %
HCT: 33.8 % — ABNORMAL LOW (ref 39.0–52.0)
HEMOGLOBIN: 12.3 g/dL — AB (ref 13.0–17.0)
LYMPHS ABS: 0.6 10*3/uL — AB (ref 0.7–4.0)
Lymphocytes Relative: 12 %
MCH: 31.2 pg (ref 26.0–34.0)
MCHC: 36.4 g/dL — ABNORMAL HIGH (ref 30.0–36.0)
MCV: 85.8 fL (ref 78.0–100.0)
Monocytes Absolute: 0.4 10*3/uL (ref 0.1–1.0)
Monocytes Relative: 8 %
NEUTROS PCT: 78 %
Neutro Abs: 3.8 10*3/uL (ref 1.7–7.7)
Platelets: 164 10*3/uL (ref 150–400)
RBC: 3.94 MIL/uL — AB (ref 4.22–5.81)
RDW: 16 % — ABNORMAL HIGH (ref 11.5–15.5)
WBC: 5 10*3/uL (ref 4.0–10.5)

## 2017-06-24 LAB — I-STAT TROPONIN, ED: TROPONIN I, POC: 0.02 ng/mL (ref 0.00–0.08)

## 2017-06-24 LAB — CBG MONITORING, ED: Glucose-Capillary: 347 mg/dL — ABNORMAL HIGH (ref 65–99)

## 2017-06-24 MED ORDER — ASPIRIN 325 MG PO TABS
325.0000 mg | ORAL_TABLET | Freq: Every day | ORAL | Status: DC
  Administered 2017-06-25 – 2017-06-26 (×2): 325 mg via ORAL
  Filled 2017-06-24 (×2): qty 1

## 2017-06-24 MED ORDER — METOPROLOL TARTRATE 5 MG/5ML IV SOLN
5.0000 mg | Freq: Once | INTRAVENOUS | Status: AC
  Administered 2017-06-25: 5 mg via INTRAVENOUS
  Filled 2017-06-24: qty 5

## 2017-06-24 NOTE — ED Triage Notes (Addendum)
Pt brought in via EMS for c/o chest tightness 5/10 and Sob; Pt denied radiating pain or nasuea or vomitting; Pt HR between 130-150 per EMS; per pt wife called EMS due to pt stating "I dont feel right." Pt states that BP elevated on home machine; Pt states that he is diabetic; no CBG from EMS; SN to obtain now. Pt A/O x4 with no signs of current distress. EMS stated that due to extensive meds nothing given en route. Pt states that he cant have BP in R arm due to having  A "blockage in his vein per Dr. Johnsie Cancel (Cardiology), " but he can have IVs and has had IVs in the past on the R side. SN applied restricted limb bracelet.

## 2017-06-24 NOTE — ED Provider Notes (Signed)
[facility] EMERGENCY DEPARTMENT Provider Note   CSN: [id number] Arrival date & time: 06/24/17  2200     History   Chief Complaint Chief Complaint  Patient presents with  . Shortness of Breath    Chest tightness    HPI Michael [name] is a 76 y.o. male.  Patient with PMH of paroxysmal a-fib, CAD, CKD, HTN, HL, and DM presents to the ED with a chief complaint of palpitations.  He states that he began feeling "funny" at 8:30 pm.  He states that he had some chest tightness and reported a little shortness of breath previously, but denies any SOB now.  He denies any numbness, weakness, tingling.  Denies any other associated symptoms.  He states that he takes plavix and aspirin daily for a-fib.    PCP: [name] Cardiologist: Johnsie Cancel   The history is provided by the patient. No language interpreter was used.    Past Medical History:  Diagnosis Date  . Acute on chronic heart failure ([place])   . Allergy   . Arthritis   . AVM (arteriovenous malformation) of colon   . C. difficile enteritis   . CAD (coronary artery disease)    a. Cath 10/2014 - Mild to moderate diagonal, circumflex and obtuse marginal disease. Innominate artery sternosis.  . Carotid artery disease ([place])    a. Carotid dopple 03/2014 61-95% RICA &  09-32% LICA stenosis b. 6/71  . CKD (chronic kidney disease), stage III ([place])   . Clostridium difficile colitis 05/13/2016  . Dyspnea   . Dysrhythmia    ATRIAL FIBRILATION  . Elevated troponin    a. 09/2014 in setting of AF RVR-->Myoview: EF 49%, no ischemia/infarct->Med Rx.  . GERD (gastroesophageal reflux disease)   . Gout   . H/O transfusion of whole blood   . Heart murmur   . History of PFTs    PFTs 6/16:  FVC 3.73 (87%), FEV1 2.93 (88%), FEV1/FVC 78%, DLCO 69%  . Hx of adenomatous colonic polyps 07/02/2016  . Hyperlipidemia   . Hypertension   . Hypothyroidism   . Iron deficiency anemia   . PAF (paroxysmal atrial fibrillation) ([place])    a.  09/2014: Converted on Dilt;  b. CHA2DS2VASc = 3-->eliquis;  c. 09/2014 Echo: EF 55-60%, mild LVH, mildly dil LA.  [name] Personal history of colonic polyps 2007, 2008   adenoma each time, largest 12 mm in 2007  . Psoriasis   . Staph infection 07/2016   left hand   . Staphylococcus aureus bacteremia 07/31/2016  . Type II diabetes mellitus ([place])    TYPE 2    Patient Active Problem List   Diagnosis Date Noted  . GERD (gastroesophageal reflux disease) 02/05/2017  . PAF (paroxysmal atrial fibrillation) ([place]) 02/05/2017  . CKD (chronic kidney disease), stage III ([place]) 02/05/2017  . Acute on chronic diastolic (congestive) heart failure ([place]) 02/05/2017  . History of GI bleed 08/26/2016  . Septic phlebitis of upper extremity 08/26/2016  . Coronary artery disease involving native coronary artery of native heart without angina pectoris 07/31/2016  . PAD (peripheral artery disease) ([place]) 07/31/2016  . Septic thrombophlebitis of upper extremity 07/31/2016  . History of Clostridium difficile colitis 07/31/2016  . Nonrheumatic aortic valve stenosis   . Chronic diastolic CHF (congestive heart failure) ([place]) 07/27/2016  . Hx of adenomatous colonic polyps 07/02/2016  . Shortness of breath 06/05/2016  . Hypothyroidism 03/17/2016  . Noncompliance with diet and medication regimen 12/16/2015  . Frequency-urgency syndrome 03/08/2015  .  Subclavian artery stenosis, right ([place]) 01/16/2015  . Cholelithiasis 01/16/2015  . Restless leg syndrome 01/10/2015  . Postherpetic neuralgia 12/18/2014  . Persistent atrial fibrillation ([place])   . Hyperlipidemia   . Essential hypertension   . Psoriasis 03/19/2014  . Vitamin D deficiency 11/13/2013  . Carotid stenosis 11/06/2013  . Psoriatic arthritis ([place]) 11/06/2013  . Hypertriglyceridemia 05/16/2013  . Morbid obesity due to excess calories ([place]) 09/10/2012  . Gout 09/10/2012  . AVM (arteriovenous malformation) of small bowel, acquired with hemorrhage ([place]) 01/20/2012    . Iron deficiency anemia due to chronic blood loss 01/20/2012  . DM (diabetes mellitus), type 2 with peripheral vascular complications ([place]) 72/62/0355    Past Surgical History:  Procedure Laterality Date  . APPENDECTOMY  02/09/2014  . CARDIAC CATHETERIZATION N/A 07/29/2016   Procedure: Right/Left Heart Cath and Coronary Angiography;  Surgeon: Nelva Bush, MD;  Location: [facility];  Service: Cardiovascular;  Laterality: N/A;  . CARDIAC CATHETERIZATION N/A 07/29/2016   Procedure: Coronary Stent Intervention;  Surgeon: Nelva Bush, MD;  Location: [facility];  Service: Cardiovascular;  Laterality: N/A;  . COLONOSCOPY  02/10/2011   internal hemorrhoids  . COLONOSCOPY W/ POLYPECTOMY  04/09/2006   12 mm adenoma  . COLONOSCOPY W/ POLYPECTOMY  06/17/2007   5 mm adenoma  . COLONOSCOPY WITH PROPOFOL N/A 05/13/2016   Procedure: COLONOSCOPY WITH PROPOFOL;  Surgeon: Manus Gunning, MD;  Location: [facility];  Service: Gastroenterology;  Laterality: N/A;  . CORONARY STENT PLACEMENT  07/29/2016   OMI    DES  . ENTEROSCOPY N/A 08/14/2016   Procedure: ENTEROSCOPY;  Surgeon: Manus Gunning, MD;  Location: [facility] ENDOSCOPY;  Service: Gastroenterology;  Laterality: N/A;  . ESOPHAGOGASTRODUODENOSCOPY  12/23/2011   Procedure: ESOPHAGOGASTRODUODENOSCOPY (EGD);  Surgeon: Irene Shipper, MD;  Location: [facility];  Service: Endoscopy;  Laterality: N/A;  with small bowel bx's  . GIVENS CAPSULE STUDY  12/28/2011  . GIVENS CAPSULE STUDY N/A 08/14/2016   Procedure: GIVENS CAPSULE STUDY;  Surgeon: Manus Gunning, MD;  Location: [facility];  Service: Gastroenterology;  Laterality: N/A;  . KNEE ARTHROSCOPY Right   . LAPAROSCOPIC APPENDECTOMY N/A 02/09/2014   Procedure: APPENDECTOMY LAPAROSCOPIC;  Surgeon: Leighton Ruff, MD;  Location: [facility];  Service: General;  Laterality: N/A;  . LEFT HEART CATHETERIZATION WITH CORONARY ANGIOGRAM N/A 11/15/2014   Procedure: LEFT HEART  CATHETERIZATION WITH CORONARY ANGIOGRAM;  Surgeon: Belva Crome, MD;  Location: [facility] CATH LAB;  Service: Cardiovascular;  Laterality: N/A;  . ROTATOR CUFF REPAIR     left       Home Medications    Prior to Admission medications   Medication Sig Start Date End Date Taking? Authorizing Provider  allopurinol (ZYLOPRIM) 100 MG tablet Take 2 tablets (200 mg total) by mouth daily. 04/28/17   [name], Evie Lacks, MD  aspirin 81 MG chewable tablet Chew 1 tablet (81 mg total) by mouth daily. 04/28/17   [name], Evie Lacks, MD  atorvastatin (LIPITOR) 40 MG tablet Take 1 tablet (40 mg total) by mouth daily. 04/28/17   [name], Evie Lacks, MD  b complex vitamins tablet Take 1 tablet by mouth daily. 04/14/17   [name], Evie Lacks, MD  Blood Glucose Monitoring Suppl (ONE TOUCH ULTRA 2) W/DEVICE KIT Use as directed Dx E11.9 05/18/14   [name], Evie Lacks, MD  Blood Pressure Monitor KIT Use to check blood pressure daily Dx I10 10/04/15   [name], Evie Lacks, MD  Cholecalciferol 1000 UNITS TBDP Take 1,000-5,000 Units by  mouth daily. Takes 1000 units everyday except for on Saturday patient takes 5000 units    [provider]  clopidogrel (PLAVIX) 75 MG tablet Take 1 tablet (75 mg total) by mouth daily with breakfast. 05/03/17   Josue Hector, MD  diltiazem (CARTIA XT) 180 MG 24 hr capsule Take 1 capsule (180 mg total) by mouth daily. 05/04/17   Wellington Hampshire, MD  fenofibrate 160 MG tablet TAKE 1 TABLET(160 MG) BY MOUTH DAILY 05/24/17   Josue Hector, MD  ferrous sulfate 325 (65 FE) MG tablet Take 325 mg by mouth 3 (three) times daily with meals.    [provider]  furosemide (LASIX) 40 MG tablet Take 1 tablet (40 mg total) by mouth 2 (two) times daily. 05/24/17   Josue Hector, MD  gabapentin (NEURONTIN) 100 MG capsule Take 1 capsule (100 mg total) by mouth 2 (two) times daily. 04/28/17   [name], Evie Lacks, MD  glipiZIDE (GLUCOTROL) 10 MG tablet Take 1 tablet (10 mg total) by  mouth 2 (two) times daily before a meal. 04/28/17   [name], Evie Lacks, MD  glucose blood (ONE TOUCH ULTRA TEST) test strip 1 each by Other route 4 (four) times daily. Use to check blood sugar four times a day  Dx: E11.9- insulin Dependant 05/17/17   [name], Evie Lacks, MD  hydrALAZINE (APRESOLINE) 25 MG tablet Take 1 tablet (25 mg total) by mouth 3 (three) times daily. 04/28/17   [name], Evie Lacks, MD  HYDROcodone-acetaminophen (NORCO/VICODIN) 5-325 MG tablet Take 1 tablet by mouth 2 (two) times daily as needed for moderate pain. 08/28/16 08/28/17  [name], Evie Lacks, MD  Insulin Glargine (TOUJEO SOLOSTAR) 300 UNIT/ML SOPN Inject 52 Units into the skin daily. 05/04/17   [name], Evie Lacks, MD  Insulin Pen Needle 31G X 5 MM MISC 1 Units by Does not apply route 4 (four) times daily. 03/17/16   [name], Evie Lacks, MD  Lancets [facility] ULTRASOFT) lancets Use to check blood sugars daily 05/18/14   [name], Evie Lacks, MD  levothyroxine (SYNTHROID, LEVOTHROID) 150 MCG tablet take 1 tablet by mouth daily BEFORE BREAKFAST 04/28/17   [name], Evie Lacks, MD  metoprolol tartrate (LOPRESSOR) 25 MG tablet Take 3 tablets (75 mg total) by mouth 2 (two) times daily. 05/03/17   Josue Hector, MD  nitroGLYCERIN (NITROSTAT) 0.4 MG SL tablet Place 1 tablet (0.4 mg total) under the tongue every 5 (five) minutes as needed for chest pain. 05/27/17 08/25/17  Josue Hector, MD  omeprazole (PRILOSEC) 40 MG capsule Take 1 capsule (40 mg total) by mouth daily. 04/28/17   [name], Evie Lacks, MD  potassium chloride SA (K-DUR,KLOR-CON) 20 MEQ tablet Take 1 tablet (20 mEq total) by mouth daily. 04/28/17   [name], Evie Lacks, MD    Family History Family History  Problem Relation Age of Onset  . Heart attack Father   . Lung cancer Father   . Diabetes Father   . Stroke Father   . Hypertension Father   . Stroke Mother   . Hypertension Mother   . Cancer Brother        liver  . Heart disease Brother        chf  .  COPD Sister   . Malignant hyperthermia Neg Hx     Social History Social History   Tobacco Use  . Smoking status: Former Smoker    Packs/day: 0.70    Years: 48.00    Pack years: 33.60  Types: Cigarettes    Last attempt to quit: 03/29/2006    Years since quitting: 11.2  . Smokeless tobacco: Never Used  Substance Use Topics  . Alcohol use: Yes    Alcohol/week: 0.6 oz    Types: 1 Cans of beer per week    Comment: occasional alcohol intake  . Drug use: No     Allergies   Percocet [oxycodone-acetaminophen]; Invokana [canagliflozin]; and Metformin and related   Review of Systems Review of Systems  All other systems reviewed and are negative.    Physical Exam Updated Vital Signs BP (!) 200/92 (BP Location: Left Arm)   Pulse (!) 150   Resp 18   SpO2 98%   Physical Exam  Constitutional: He is oriented to person, place, and time. He appears well-developed and well-nourished.  HENT:  Head: Normocephalic and atraumatic.  Eyes: Conjunctivae and EOM are normal. Pupils are equal, round, and reactive to light. Right eye exhibits no discharge. Left eye exhibits no discharge. No scleral icterus.  Neck: Normal range of motion. Neck supple. No JVD present.  Cardiovascular: Regular rhythm and normal heart sounds. Exam reveals no gallop and no friction rub.  No murmur heard. tachycardia  Pulmonary/Chest: Effort normal and breath sounds normal. No respiratory distress. He has no wheezes. He has no rales. He exhibits no tenderness.  Abdominal: Soft. He exhibits no distension and no mass. There is no tenderness. There is no rebound and no guarding.  Musculoskeletal: Normal range of motion. He exhibits no edema or tenderness.  Neurological: He is alert and oriented to person, place, and time.  Skin: Skin is warm and dry.  Psychiatric: He has a normal mood and affect. His behavior is normal. Judgment and thought content normal.  Nursing note and vitals reviewed.    ED Treatments /  Results  Labs (all labs ordered are listed, but only abnormal results are displayed) Labs Reviewed  CBC WITH DIFFERENTIAL/PLATELET - Abnormal; Notable for the following components:      Result Value   RBC 3.94 (*)    Hemoglobin 12.3 (*)    HCT 33.8 (*)    MCHC 36.4 (*)    RDW 16.0 (*)    Lymphs Abs 0.6 (*)    All other components within normal limits  BASIC METABOLIC PANEL - Abnormal; Notable for the following components:   Sodium 131 (*)    Potassium 5.3 (*)    Chloride 100 (*)    CO2 21 (*)    Glucose, Bld 355 (*)    BUN 36 (*)    Creatinine, Ser 1.83 (*)    Calcium 8.7 (*)    GFR calc non Af Amer 35 (*)    GFR calc Af Amer 40 (*)    All other components within normal limits  CBG MONITORING, ED - Abnormal; Notable for the following components:   Glucose-Capillary 347 (*)    All other components within normal limits  BRAIN NATRIURETIC PEPTIDE  I-STAT TROPONIN, ED    EKG  EKG Interpretation  Date/Time:  Thursday June 24 2017 22:14:57 EST Ventricular Rate:  126 PR Interval:    QRS Duration: 104 QT Interval:  316 QTC Calculation: 458 R Axis:   85 Text Interpretation:  Sinus or ectopic atrial tachycardia Borderline right axis deviation Repol abnrm suggests ischemia, diffuse leads st depression and t wave changes are new compared to EKG done on July 14. Confirmed by Isla Pence [phone]) on 06/24/2017 10:36:45 PM       Radiology  Dg Chest 2 View  Result Date: 06/24/2017 CLINICAL DATA:  Chest pain. EXAM: CHEST  2 VIEW COMPARISON:  Most recent radiograph 02/05/2017 FINDINGS: Cardiomegaly, similar to decreased from prior year. Unchanged mediastinal contours with aortic atherosclerosis. Persistent left pleural effusion, but decreased from prior exam. Mild central pulmonary edema. No new airspace disease or pneumothorax. No acute osseous abnormality. IMPRESSION: Cardiomegaly with mild pulmonary edema and small left pleural effusion, improved from most recent comparison.  Electronically Signed   By: Jeb Levering M.D.   On: 06/24/2017 23:10    Procedures Procedures (including critical care time) CRITICAL CARE Performed by: Montine Circle   Total critical care time: 39 minutes  Critical care time was exclusive of separately billable procedures and treating other patients.  Critical care was necessary to treat or prevent imminent or life-threatening deterioration.  Critical care was time spent personally by me on the following activities: development of treatment plan with patient and/or surrogate as well as nursing, discussions with consultants, evaluation of patient's response to treatment, examination of patient, obtaining history from patient or surrogate, ordering and performing treatments and interventions, ordering and review of laboratory studies, ordering and review of radiographic studies, pulse oximetry and re-evaluation of patient's condition.  Medications Ordered in ED Medications - No data to display   Initial Impression / Assessment and Plan / ED Course  I have reviewed the triage vital signs and the nursing notes.  Pertinent labs & imaging results that were available during my care of the patient were reviewed by me and considered in my medical decision making (see chart for details).    Patient with chest tightness and SOB.  Onset tonight at 8:30.  HR 120s-150s PTA.  Has been consistently in the 120s since arrival.   Patient seen by and discussed with Dr. Gilford Raid.  Looks like sinus tachy now, but does have some new EKG changes as above.  Dr. Gilford Raid recommends giving 43m of lopressor.  12:32 AM Patient now with HR of 130s.  Repeat EKG now looks like flutter/fib.  Reviewed with Dr. DStark Jock  Recommends starting heparin drip and cardizem.  Chads2vasc is 5.  Not anticoagulated.  Appreciate Dr. KHal Hopefor admitting the patient.  Final Clinical Impressions(s) / ED Diagnoses   Final diagnoses:  Atrial fibrillation with rapid  ventricular response ([facility]    ED Discharge Orders    None       BMontine Circle PA-C 06/25/17 [id number]   HIsla Pence MD 06/28/17 [phone]

## 2017-06-25 ENCOUNTER — Other Ambulatory Visit: Payer: Self-pay

## 2017-06-25 ENCOUNTER — Encounter (HOSPITAL_COMMUNITY): Payer: Self-pay | Admitting: Internal Medicine

## 2017-06-25 DIAGNOSIS — I480 Paroxysmal atrial fibrillation: Secondary | ICD-10-CM | POA: Diagnosis not present

## 2017-06-25 DIAGNOSIS — N183 Chronic kidney disease, stage 3 (moderate): Secondary | ICD-10-CM | POA: Diagnosis not present

## 2017-06-25 DIAGNOSIS — E1151 Type 2 diabetes mellitus with diabetic peripheral angiopathy without gangrene: Secondary | ICD-10-CM | POA: Diagnosis not present

## 2017-06-25 DIAGNOSIS — I2510 Atherosclerotic heart disease of native coronary artery without angina pectoris: Secondary | ICD-10-CM

## 2017-06-25 DIAGNOSIS — I4891 Unspecified atrial fibrillation: Secondary | ICD-10-CM | POA: Diagnosis not present

## 2017-06-25 DIAGNOSIS — I5032 Chronic diastolic (congestive) heart failure: Secondary | ICD-10-CM | POA: Diagnosis not present

## 2017-06-25 LAB — CBC
HCT: 33.9 % — ABNORMAL LOW (ref 39.0–52.0)
Hemoglobin: 11.3 g/dL — ABNORMAL LOW (ref 13.0–17.0)
MCH: 30.1 pg (ref 26.0–34.0)
MCHC: 33.3 g/dL (ref 30.0–36.0)
MCV: 90.2 fL (ref 78.0–100.0)
PLATELETS: 156 10*3/uL (ref 150–400)
RBC: 3.76 MIL/uL — ABNORMAL LOW (ref 4.22–5.81)
RDW: 15.7 % — AB (ref 11.5–15.5)
WBC: 4.6 10*3/uL (ref 4.0–10.5)

## 2017-06-25 LAB — BASIC METABOLIC PANEL
ANION GAP: 9 (ref 5–15)
BUN: 37 mg/dL — ABNORMAL HIGH (ref 6–20)
CALCIUM: 8.3 mg/dL — AB (ref 8.9–10.3)
CO2: 22 mmol/L (ref 22–32)
CREATININE: 1.83 mg/dL — AB (ref 0.61–1.24)
Chloride: 100 mmol/L — ABNORMAL LOW (ref 101–111)
GFR calc Af Amer: 40 mL/min — ABNORMAL LOW (ref >60)
GFR calc non Af Amer: 35 mL/min — ABNORMAL LOW (ref >60)
GLUCOSE: 398 mg/dL — AB (ref 65–99)
Potassium: 4.5 mmol/L (ref 3.5–5.1)
Sodium: 131 mmol/L — ABNORMAL LOW (ref 135–145)

## 2017-06-25 LAB — CBG MONITORING, ED
Glucose-Capillary: 416 mg/dL — ABNORMAL HIGH (ref 65–99)
Glucose-Capillary: 375 mg/dL — ABNORMAL HIGH (ref 65–99)
Glucose-Capillary: 324 mg/dL — ABNORMAL HIGH (ref 65–99)

## 2017-06-25 LAB — GLUCOSE, CAPILLARY
GLUCOSE-CAPILLARY: 234 mg/dL — AB (ref 65–99)
Glucose-Capillary: 315 mg/dL — ABNORMAL HIGH (ref 65–99)

## 2017-06-25 LAB — TROPONIN I
TROPONIN I: 0.07 ng/mL — AB (ref <0.03)
TROPONIN I: 0.69 ng/mL — AB (ref <0.03)
Troponin I: 0.58 ng/mL (ref <0.03)

## 2017-06-25 LAB — HEPARIN LEVEL (UNFRACTIONATED)
Heparin Unfractionated: <0.1 IU/mL — ABNORMAL LOW (ref 0.30–0.70)
Heparin Unfractionated: 0.39 IU/mL (ref 0.30–0.70)

## 2017-06-25 LAB — BRAIN NATRIURETIC PEPTIDE: B Natriuretic Peptide: 180.4 pg/mL — ABNORMAL HIGH (ref 0.0–100.0)

## 2017-06-25 LAB — TSH: TSH: 3.293 u[IU]/mL (ref 0.350–4.500)

## 2017-06-25 LAB — MRSA PCR SCREENING: MRSA BY PCR: NEGATIVE

## 2017-06-25 MED ORDER — INSULIN ASPART 100 UNIT/ML ~~LOC~~ SOLN
3.0000 [IU] | Freq: Three times a day (TID) | SUBCUTANEOUS | Status: DC
  Administered 2017-06-25: 3 [IU] via SUBCUTANEOUS

## 2017-06-25 MED ORDER — ONDANSETRON HCL 4 MG/2ML IJ SOLN: 4.0000 mg | Freq: Four times a day (QID) | INTRAMUSCULAR | Status: DC | PRN

## 2017-06-25 MED ORDER — HEPARIN BOLUS VIA INFUSION
2000.0000 [IU] | Freq: Once | INTRAVENOUS | Status: AC
  Administered 2017-06-25: 2000 [IU] via INTRAVENOUS
  Filled 2017-06-25: qty 2000

## 2017-06-25 MED ORDER — ONDANSETRON HCL 4 MG PO TABS: 4.0000 mg | ORAL_TABLET | Freq: Four times a day (QID) | ORAL | Status: DC | PRN

## 2017-06-25 MED ORDER — PANTOPRAZOLE SODIUM 40 MG PO TBEC
40.0000 mg | DELAYED_RELEASE_TABLET | Freq: Every day | ORAL | Status: DC
  Administered 2017-06-25 – 2017-06-26 (×2): 40 mg via ORAL
  Filled 2017-06-25 (×2): qty 1

## 2017-06-25 MED ORDER — HEPARIN (PORCINE) IN NACL 100-0.45 UNIT/ML-% IJ SOLN
1950.0000 [IU]/h | INTRAMUSCULAR | Status: DC
  Administered 2017-06-25: 1600 [IU]/h via INTRAVENOUS
  Administered 2017-06-26: 1950 [IU]/h via INTRAVENOUS
  Filled 2017-06-25 (×3): qty 250

## 2017-06-25 MED ORDER — AMIODARONE HCL 200 MG PO TABS
200.0000 mg | ORAL_TABLET | Freq: Every day | ORAL | Status: DC
  Administered 2017-06-25 – 2017-06-26 (×2): 200 mg via ORAL
  Filled 2017-06-25 (×2): qty 1

## 2017-06-25 MED ORDER — METOPROLOL TARTRATE 50 MG PO TABS
75.0000 mg | ORAL_TABLET | Freq: Two times a day (BID) | ORAL | Status: DC
  Administered 2017-06-25 – 2017-06-26 (×2): 75 mg via ORAL
  Filled 2017-06-25: qty 1
  Filled 2017-06-25: qty 3
  Filled 2017-06-25: qty 1

## 2017-06-25 MED ORDER — HEPARIN BOLUS VIA INFUSION
1500.0000 [IU] | Freq: Once | INTRAVENOUS | Status: AC
  Administered 2017-06-25: 1500 [IU] via INTRAVENOUS
  Filled 2017-06-25: qty 1500

## 2017-06-25 MED ORDER — HYDROCODONE-ACETAMINOPHEN 5-325 MG PO TABS: 1.0000 | ORAL_TABLET | Freq: Two times a day (BID) | ORAL | Status: DC | PRN

## 2017-06-25 MED ORDER — ACETAMINOPHEN 325 MG PO TABS
650.0000 mg | ORAL_TABLET | Freq: Four times a day (QID) | ORAL | Status: DC | PRN
  Administered 2017-06-26: 650 mg via ORAL
  Filled 2017-06-25: qty 2

## 2017-06-25 MED ORDER — DILTIAZEM LOAD VIA INFUSION
10.0000 mg | Freq: Once | INTRAVENOUS | Status: AC
  Administered 2017-06-25: 10 mg via INTRAVENOUS
  Filled 2017-06-25: qty 10

## 2017-06-25 MED ORDER — NITROGLYCERIN 0.4 MG SL SUBL: 0.4000 mg | SUBLINGUAL_TABLET | SUBLINGUAL | Status: DC | PRN

## 2017-06-25 MED ORDER — INSULIN GLARGINE 100 UNIT/ML ~~LOC~~ SOLN
52.0000 [IU] | Freq: Every day | SUBCUTANEOUS | Status: DC
  Administered 2017-06-25 – 2017-06-26 (×2): 52 [IU] via SUBCUTANEOUS
  Filled 2017-06-25 (×3): qty 0.52

## 2017-06-25 MED ORDER — FERROUS SULFATE 325 (65 FE) MG PO TABS
325.0000 mg | ORAL_TABLET | Freq: Three times a day (TID) | ORAL | Status: DC
  Administered 2017-06-25 – 2017-06-26 (×5): 325 mg via ORAL
  Filled 2017-06-25 (×7): qty 1

## 2017-06-25 MED ORDER — FUROSEMIDE 80 MG PO TABS
80.0000 mg | ORAL_TABLET | Freq: Two times a day (BID) | ORAL | Status: DC
  Administered 2017-06-25 (×2): 80 mg via ORAL
  Filled 2017-06-25: qty 4
  Filled 2017-06-25: qty 1

## 2017-06-25 MED ORDER — DEXTROSE 5 % IV SOLN
5.0000 mg/h | INTRAVENOUS | Status: DC
  Administered 2017-06-25: 5 mg/h via INTRAVENOUS
  Filled 2017-06-25: qty 100

## 2017-06-25 MED ORDER — INSULIN ASPART 100 UNIT/ML ~~LOC~~ SOLN
0.0000 [IU] | Freq: Every day | SUBCUTANEOUS | Status: DC
  Administered 2017-06-25: 2 [IU] via SUBCUTANEOUS

## 2017-06-25 MED ORDER — GLIPIZIDE 10 MG PO TABS
10.0000 mg | ORAL_TABLET | Freq: Two times a day (BID) | ORAL | Status: DC
  Filled 2017-06-25: qty 1

## 2017-06-25 MED ORDER — ALLOPURINOL 100 MG PO TABS
200.0000 mg | ORAL_TABLET | Freq: Every day | ORAL | Status: DC
  Administered 2017-06-26: 200 mg via ORAL
  Filled 2017-06-25: qty 2

## 2017-06-25 MED ORDER — CLOPIDOGREL BISULFATE 75 MG PO TABS
75.0000 mg | ORAL_TABLET | Freq: Every day | ORAL | Status: DC
  Administered 2017-06-25 – 2017-06-26 (×2): 75 mg via ORAL
  Filled 2017-06-25 (×2): qty 1

## 2017-06-25 MED ORDER — ATORVASTATIN CALCIUM 40 MG PO TABS
40.0000 mg | ORAL_TABLET | Freq: Every day | ORAL | Status: DC
  Administered 2017-06-25 – 2017-06-26 (×2): 40 mg via ORAL
  Filled 2017-06-25 (×2): qty 1

## 2017-06-25 MED ORDER — FENOFIBRATE 160 MG PO TABS
160.0000 mg | ORAL_TABLET | Freq: Every day | ORAL | Status: DC
  Administered 2017-06-26: 160 mg via ORAL
  Filled 2017-06-25: qty 1

## 2017-06-25 MED ORDER — ACETAMINOPHEN 650 MG RE SUPP: 650.0000 mg | Freq: Four times a day (QID) | RECTAL | Status: DC | PRN

## 2017-06-25 MED ORDER — DILTIAZEM HCL ER COATED BEADS 180 MG PO CP24: 180.0000 mg | ORAL_CAPSULE | Freq: Every day | ORAL | Status: DC

## 2017-06-25 MED ORDER — GABAPENTIN 100 MG PO CAPS
100.0000 mg | ORAL_CAPSULE | Freq: Two times a day (BID) | ORAL | Status: DC
  Administered 2017-06-25 – 2017-06-26 (×3): 100 mg via ORAL
  Filled 2017-06-25 (×3): qty 1

## 2017-06-25 MED ORDER — INSULIN ASPART 100 UNIT/ML ~~LOC~~ SOLN
0.0000 [IU] | Freq: Three times a day (TID) | SUBCUTANEOUS | Status: DC
  Administered 2017-06-25: 5 [IU] via SUBCUTANEOUS
  Administered 2017-06-25: 15 [IU] via SUBCUTANEOUS
  Administered 2017-06-25 – 2017-06-26 (×2): 11 [IU] via SUBCUTANEOUS
  Administered 2017-06-26: 5 [IU] via SUBCUTANEOUS
  Filled 2017-06-25: qty 1

## 2017-06-25 MED ORDER — DILTIAZEM HCL ER COATED BEADS 180 MG PO CP24
180.0000 mg | ORAL_CAPSULE | Freq: Every day | ORAL | Status: DC
  Administered 2017-06-25 – 2017-06-26 (×2): 180 mg via ORAL
  Filled 2017-06-25 (×2): qty 1

## 2017-06-25 MED ORDER — HYDRALAZINE HCL 25 MG PO TABS
25.0000 mg | ORAL_TABLET | Freq: Three times a day (TID) | ORAL | Status: DC
Start: 2017-06-25 — End: 2017-06-26
  Administered 2017-06-25 – 2017-06-26 (×3): 25 mg via ORAL
  Filled 2017-06-25 (×4): qty 1

## 2017-06-25 MED ORDER — POTASSIUM CHLORIDE CRYS ER 20 MEQ PO TBCR: 20.0000 meq | EXTENDED_RELEASE_TABLET | Freq: Every day | ORAL | Status: DC

## 2017-06-25 MED ORDER — INSULIN ASPART 100 UNIT/ML ~~LOC~~ SOLN: 0.0000 [IU] | Freq: Three times a day (TID) | SUBCUTANEOUS | Status: DC

## 2017-06-25 MED ORDER — LEVOTHYROXINE SODIUM 75 MCG PO TABS
150.0000 ug | ORAL_TABLET | Freq: Every day | ORAL | Status: DC
  Administered 2017-06-25 – 2017-06-26 (×2): 150 ug via ORAL
  Filled 2017-06-25: qty 2
  Filled 2017-06-25: qty 1

## 2017-06-25 NOTE — ED Notes (Signed)
Admitting MD paged to [facility] to [phone] @ [phone].

## 2017-06-25 NOTE — H&P (Signed)
History and Physical    Michael [name] HBZ:[id number] DOB: [birth year] DOA: 06/24/2017  PCP: Cassandria Anger, MD  Patient coming from: Home.   Chief Complaint: Palpitations and not feeling well.  HPI: Michael [name] is a 76 y.o. male with history of paroxysmal atrial fibrillation, CAD status post stenting in March 1017, diastolic CHF, history of GI bleed secondary to small bowel AVMs, chronic kidney disease stage III, anemia presents to the ER after patient has not been feeling well since last evening with palpitations.  Patient states he does have the symptoms when his A. fib starts acting up.  Denies any chest pain or shortness of breath.  Patient states he has been compliant with his medications.  He has had recently followed up with his cardiologist 3 days ago.  ED Course: In the ER patient was started on Cardizem infusion after Cardizem bolus.  Chest x-ray shows mild congestion with left pleural effusion.  Patient denies any shortness of breath.  Blood sugar was elevated but not in ketoacidosis.  Patient states he did take his long-acting insulin yesterday morning.  Patient is being admitted for A. fib with RVR.  By the time I examined patient has converted back to sinus rhythm is on Cardizem infusion.  Review of Systems: As per HPI, rest all negative.   Past Medical History:  Diagnosis Date  . Acute on chronic heart failure ([place])   . Allergy   . Arthritis   . AVM (arteriovenous malformation) of colon   . C. difficile enteritis   . CAD (coronary artery disease)    a. Cath 10/2014 - Mild to moderate diagonal, circumflex and obtuse marginal disease. Innominate artery sternosis.  . Carotid artery disease ([place])    a. Carotid dopple 03/2014 51-02% RICA &  58-52% LICA stenosis b. 7/78  . CKD (chronic kidney disease), stage III ([place])   . Clostridium difficile colitis 05/13/2016  . Dyspnea   . Dysrhythmia    ATRIAL FIBRILATION  . Elevated troponin    a. 09/2014 in setting of AF  RVR-->Myoview: EF 49%, no ischemia/infarct->Med Rx.  . GERD (gastroesophageal reflux disease)   . Gout   . H/O transfusion of whole blood   . Heart murmur   . History of PFTs    PFTs 6/16:  FVC 3.73 (87%), FEV1 2.93 (88%), FEV1/FVC 78%, DLCO 69%  . Hx of adenomatous colonic polyps 07/02/2016  . Hyperlipidemia   . Hypertension   . Hypothyroidism   . Iron deficiency anemia   . PAF (paroxysmal atrial fibrillation) ([place])    a. 09/2014: Converted on Dilt;  b. CHA2DS2VASc = 3-->eliquis;  c. 09/2014 Echo: EF 55-60%, mild LVH, mildly dil LA.  [name] Personal history of colonic polyps 2007, 2008   adenoma each time, largest 12 mm in 2007  . Psoriasis   . Staph infection 07/2016   left hand   . Staphylococcus aureus bacteremia 07/31/2016  . Type II diabetes mellitus ([place])    TYPE 2    Past Surgical History:  Procedure Laterality Date  . APPENDECTOMY  02/09/2014  . CARDIAC CATHETERIZATION N/A 07/29/2016   Procedure: Right/Left Heart Cath and Coronary Angiography;  Surgeon: Nelva Bush, MD;  Location: [facility];  Service: Cardiovascular;  Laterality: N/A;  . CARDIAC CATHETERIZATION N/A 07/29/2016   Procedure: Coronary Stent Intervention;  Surgeon: Nelva Bush, MD;  Location: [facility];  Service: Cardiovascular;  Laterality: N/A;  . COLONOSCOPY  02/10/2011   internal hemorrhoids  .  COLONOSCOPY W/ POLYPECTOMY  04/09/2006   12 mm adenoma  . COLONOSCOPY W/ POLYPECTOMY  06/17/2007   5 mm adenoma  . COLONOSCOPY WITH PROPOFOL N/A 05/13/2016   Procedure: COLONOSCOPY WITH PROPOFOL;  Surgeon: Manus Gunning, MD;  Location: [facility];  Service: Gastroenterology;  Laterality: N/A;  . CORONARY STENT PLACEMENT  07/29/2016   OMI    DES  . ENTEROSCOPY N/A 08/14/2016   Procedure: ENTEROSCOPY;  Surgeon: Manus Gunning, MD;  Location: [facility];  Service: Gastroenterology;  Laterality: N/A;  . ESOPHAGOGASTRODUODENOSCOPY  12/23/2011   Procedure: ESOPHAGOGASTRODUODENOSCOPY  (EGD);  Surgeon: Irene Shipper, MD;  Location: [facility];  Service: Endoscopy;  Laterality: N/A;  with small bowel bx's  . GIVENS CAPSULE STUDY  12/28/2011  . GIVENS CAPSULE STUDY N/A 08/14/2016   Procedure: GIVENS CAPSULE STUDY;  Surgeon: Manus Gunning, MD;  Location: [facility];  Service: Gastroenterology;  Laterality: N/A;  . KNEE ARTHROSCOPY Right   . LAPAROSCOPIC APPENDECTOMY N/A 02/09/2014   Procedure: APPENDECTOMY LAPAROSCOPIC;  Surgeon: Leighton Ruff, MD;  Location: [facility];  Service: General;  Laterality: N/A;  . LEFT HEART CATHETERIZATION WITH CORONARY ANGIOGRAM N/A 11/15/2014   Procedure: LEFT HEART CATHETERIZATION WITH CORONARY ANGIOGRAM;  Surgeon: Belva Crome, MD;  Location: [facility] CATH LAB;  Service: Cardiovascular;  Laterality: N/A;  . ROTATOR CUFF REPAIR     left     reports that he quit smoking about 11 years ago. His smoking use included cigarettes. He has a 33.60 pack-year smoking history. he has never used smokeless tobacco. He reports that he drinks about 0.6 oz of alcohol per week. He reports that he does not use drugs.  Allergies  Allergen Reactions  . Percocet [Oxycodone-Acetaminophen] Nausea And Vomiting  . Metformin And Related Nausea Only    Upset stomach  . Invokana [Canagliflozin] Rash    Family History  Problem Relation Age of Onset  . Heart attack Father   . Lung cancer Father   . Diabetes Father   . Stroke Father   . Hypertension Father   . Stroke Mother   . Hypertension Mother   . Cancer Brother        liver  . Heart disease Brother        chf  . COPD Sister   . Malignant hyperthermia Neg Hx     Prior to Admission medications   Medication Sig Start Date End Date Taking? Authorizing Provider  allopurinol (ZYLOPRIM) 100 MG tablet Take 2 tablets (200 mg total) by mouth daily. 04/28/17  Yes [name], Evie Lacks, MD  aspirin 81 MG chewable tablet Chew 1 tablet (81 mg total) by mouth daily. 04/28/17  Yes [name], Evie Lacks, MD  atorvastatin  (LIPITOR) 40 MG tablet Take 1 tablet (40 mg total) by mouth daily. 04/28/17  Yes [name], Evie Lacks, MD  b complex vitamins tablet Take 1 tablet by mouth daily. 04/14/17  Yes [name], Evie Lacks, MD  Cholecalciferol (VITAMIN D-3) 1000 units CAPS Take 1,000-5,000 Units by mouth See admin instructions. 1,000 units once a day on Sun/Mon/Tues/Wed/Thurs/Fri and 5,000 units on Sat   Yes [provider]  clopidogrel (PLAVIX) 75 MG tablet Take 1 tablet (75 mg total) by mouth daily with breakfast. 05/03/17  Yes Josue Hector, MD  diltiazem (CARTIA XT) 180 MG 24 hr capsule Take 1 capsule (180 mg total) by mouth daily. 05/04/17  Yes Wellington Hampshire, MD  fenofibrate 160 MG tablet TAKE 1 TABLET(160 MG) BY MOUTH DAILY Patient taking  differently: Take 160 mg by mouth daily.  05/24/17  Yes Josue Hector, MD  ferrous sulfate 325 (65 FE) MG tablet Take 325 mg by mouth 3 (three) times daily with meals.   Yes [provider]  furosemide (LASIX) 40 MG tablet Take 1 tablet (40 mg total) by mouth 2 (two) times daily. Patient taking differently: Take 80 mg by mouth 2 (two) times daily.  05/24/17  Yes Josue Hector, MD  gabapentin (NEURONTIN) 100 MG capsule Take 1 capsule (100 mg total) by mouth 2 (two) times daily. 04/28/17  Yes [name], Evie Lacks, MD  glipiZIDE (GLUCOTROL) 10 MG tablet Take 1 tablet (10 mg total) by mouth 2 (two) times daily before a meal. 04/28/17  Yes [name], Evie Lacks, MD  hydrALAZINE (APRESOLINE) 25 MG tablet Take 1 tablet (25 mg total) by mouth 3 (three) times daily. 04/28/17  Yes [name], Evie Lacks, MD  HYDROcodone-acetaminophen (NORCO/VICODIN) 5-325 MG tablet Take 1 tablet by mouth 2 (two) times daily as needed for moderate pain. 08/28/16 08/28/17 Yes [name], Evie Lacks, MD  Insulin Glargine (TOUJEO SOLOSTAR) 300 UNIT/ML SOPN Inject 52 Units into the skin daily. Patient taking differently: Inject 52 Units into the skin daily before breakfast.  05/04/17  Yes [name],  Evie Lacks, MD  levothyroxine (SYNTHROID, LEVOTHROID) 150 MCG tablet take 1 tablet by mouth daily BEFORE BREAKFAST Patient taking differently: Take 150 mcg by mouth daily before breakfast.  04/28/17  Yes [name], Evie Lacks, MD  metoprolol tartrate (LOPRESSOR) 25 MG tablet Take 3 tablets (75 mg total) by mouth 2 (two) times daily. 05/03/17  Yes Josue Hector, MD  omeprazole (PRILOSEC) 40 MG capsule Take 1 capsule (40 mg total) by mouth daily. 04/28/17  Yes [name], Evie Lacks, MD  potassium chloride SA (K-DUR,KLOR-CON) 20 MEQ tablet Take 1 tablet (20 mEq total) by mouth daily. 04/28/17  Yes [name], Evie Lacks, MD  Blood Glucose Monitoring Suppl (ONE TOUCH ULTRA 2) W/DEVICE KIT Use as directed Dx E11.9 05/18/14   [name], Evie Lacks, MD  Blood Pressure Monitor KIT Use to check blood pressure daily Dx I10 10/04/15   [name], Evie Lacks, MD  glucose blood (ONE TOUCH ULTRA TEST) test strip 1 each by Other route 4 (four) times daily. Use to check blood sugar four times a day  Dx: E11.9- insulin Dependant 05/17/17   [name], Evie Lacks, MD  Insulin Pen Needle 31G X 5 MM MISC 1 Units by Does not apply route 4 (four) times daily. 03/17/16   [name], Evie Lacks, MD  Lancets [facility] ULTRASOFT) lancets Use to check blood sugars daily 05/18/14   [name], Evie Lacks, MD  nitroGLYCERIN (NITROSTAT) 0.4 MG SL tablet Place 1 tablet (0.4 mg total) under the tongue every 5 (five) minutes as needed for chest pain. 05/27/17 08/25/17  Josue Hector, MD    Physical Exam: Vitals:   06/24/17 2215 06/24/17 2230 06/25/17 0013 06/25/17 0038  BP: (!) 157/83 (!) 155/95 (!) 146/74   Pulse: (!) 124 (!) 124 (!) 120   Resp: 16 15 16    Temp:   97.9 F (36.6 C) 97.7 F (36.5 C)  TempSrc:   Oral Oral  SpO2: 97% 98% 98%   Weight: 115.7 kg (255 lb)     Height: 5' 11"  (1.803 m)         Constitutional: Moderately built and nourished. Vitals:   06/24/17 2215 06/24/17 2230 06/25/17 0013 06/25/17 0038  BP: (!) 157/83  (!) 155/95 (!) 146/74   Pulse: [name])  124 (!) 124 (!) 120   Resp: 16 15 16    Temp:   97.9 F (36.6 C) 97.7 F (36.5 C)  TempSrc:   Oral Oral  SpO2: 97% 98% 98%   Weight: 115.7 kg (255 lb)     Height: 5' 11"  (1.803 m)      Eyes: Anicteric no pallor. ENMT: No discharge from the ears eyes nose or mouth. Neck: No mass felt.  No neck rigidity.  No JVD appreciated. Respiratory: No rhonchi or crepitations. Cardiovascular: S1-S2 heard no murmurs appreciated. Abdomen: Soft nontender bowel sounds present. Musculoskeletal: No edema.  No joint effusion. Skin: No rash. Neurologic: Alert awake oriented to time place and person.  Moves all extremities 5 x 5. Psychiatric: Appears normal.  Normal affect.   Labs on Admission: I have personally reviewed following labs and imaging studies  CBC: Recent Labs  Lab 06/24/17 2245  WBC 5.0  NEUTROABS 3.8  HGB 12.3*  HCT 33.8*  MCV 85.8  PLT 443   Basic Metabolic Panel: Recent Labs  Lab 06/21/17 0918 06/24/17 2245  NA 132* 131*  K 4.8 5.3*  CL 96 100*  CO2 20 21*  GLUCOSE 444* 355*  BUN 37* 36*  CREATININE 1.75* 1.83*  CALCIUM 9.0 8.7*   GFR: Estimated Creatinine Clearance: 45.8 mL/min (A) (by C-G formula based on SCr of 1.83 mg/dL (H)). Liver Function Tests: No results for input(s): AST, ALT, ALKPHOS, BILITOT, PROT, ALBUMIN in the last 168 hours. No results for input(s): LIPASE, AMYLASE in the last 168 hours. No results for input(s): AMMONIA in the last 168 hours. Coagulation Profile: No results for input(s): INR, PROTIME in the last 168 hours. Cardiac Enzymes: No results for input(s): CKTOTAL, CKMB, CKMBINDEX, TROPONINI in the last 168 hours. BNP (last 3 results) No results for input(s): PROBNP in the last 8760 hours. HbA1C: No results for input(s): HGBA1C in the last 72 hours. CBG: Recent Labs  Lab 06/24/17 2218  GLUCAP 347*   Lipid Profile: No results for input(s): CHOL, HDL, LDLCALC, TRIG, CHOLHDL, LDLDIRECT in the last  72 hours. Thyroid Function Tests: No results for input(s): TSH, T4TOTAL, FREET4, T3FREE, THYROIDAB in the last 72 hours. Anemia Panel: No results for input(s): VITAMINB12, FOLATE, FERRITIN, TIBC, IRON, RETICCTPCT in the last 72 hours. Urine analysis:    Component Value Date/Time   COLORURINE YELLOW 05/11/2016 1600   APPEARANCEUR CLEAR 05/11/2016 1600   LABSPEC 1.021 05/11/2016 1600   PHURINE 5.5 05/11/2016 1600   GLUCOSEU NEGATIVE 05/11/2016 1600   GLUCOSEU NEGATIVE 11/07/2013 0754   HGBUR NEGATIVE 05/11/2016 1600   BILIRUBINUR NEGATIVE 05/11/2016 1600   BILIRUBINUR neg 03/08/2015 1105   KETONESUR NEGATIVE 05/11/2016 1600   PROTEINUR NEGATIVE 05/11/2016 1600   UROBILINOGEN negative 03/08/2015 1105   UROBILINOGEN 0.2 02/09/2014 1805   NITRITE NEGATIVE 05/11/2016 1600   LEUKOCYTESUR NEGATIVE 05/11/2016 1600   Sepsis Labs: @LABRCNTIP (procalcitonin:4,lacticidven:4) )No results found for this or any previous visit (from the past 240 hour(s)).   Radiological Exams on Admission: Dg Chest 2 View  Result Date: 06/24/2017 CLINICAL DATA:  Chest pain. EXAM: CHEST  2 VIEW COMPARISON:  Most recent radiograph 02/05/2017 FINDINGS: Cardiomegaly, similar to decreased from prior year. Unchanged mediastinal contours with aortic atherosclerosis. Persistent left pleural effusion, but decreased from prior exam. Mild central pulmonary edema. No new airspace disease or pneumothorax. No acute osseous abnormality. IMPRESSION: Cardiomegaly with mild pulmonary edema and small left pleural effusion, improved from most recent comparison. Electronically Signed   By: Fonnie Birkenhead.D.  On: 06/24/2017 23:10    EKG: Independently reviewed.  A. fib with RVR.  Assessment/Plan Principal Problem:   Atrial fibrillation with rapid ventricular response (HCC) Active Problems:   DM (diabetes mellitus), type 2 with peripheral vascular complications (HCC)   Essential hypertension   Postherpetic neuralgia    Subclavian artery stenosis, right (HCC)   Hypothyroidism   Chronic diastolic CHF (congestive heart failure) (HCC)   PAD (peripheral artery disease) (HCC)   CKD (chronic kidney disease), stage III ([place])    1. A. fib with RVR -patient was given Cardizem bolus followed by Cardizem infusion.  Patient has converted to sinus rhythm will continue with oral Cardizem and metoprolol which patient takes at home.  For now patient was started on heparin infusion.  Patient was off any anticoagulation secondary GI bleed.  Patient is also on aspirin and Plavix.  As per the patient cardiologist was planning to restart anticoagulation next month in January 2019.  May discuss with cardiologist in a.m. if patient needs to go home on anti-correlation.  Check cardiac markers and TSH. 2. Diabetes mellitus type 2 uncontrolled with hyperglycemia -patient states he did take his long-acting insulin Toujeo.  We will closely follow metabolic panel to make sure there is no developing DKA.  I have placed patient on sliding scale coverage. 3. CAD status post recent stenting in March 2018 - denies any chest pain.  Patient is on aspirin Plavix statins and beta-blockers.  Cycle cardiac markers. 4. Chronic diastolic CHF last EF measured in September 2018 showing EF of 55-60% with grade 1 diastolic dysfunction on Lasix.  Chest x-ray shows congestion and pleural effusion.  Patient denies any shortness of breath.  Closely follow intake output elevated metabolic panel. 5. Hypothyroidism on Synthroid.  Check TSH. 6. Chronic kidney disease stage III-IV -follow metabolic panel. 7. Chronic anemia likely from renal disease -follow CBC now that patient is on heparin and history of AV malformations with GI bleed. 8. History of GI bleed secondary to AVMs in the small bowel.  I have reviewed patient's old charts and labs.   DVT prophylaxis: Heparin. Code Status: Full code. Family Communication: Discussed with patient. Disposition Plan:  Home. Consults called: None. Admission status: Observation.   Rise Patience MD [facility] Pager [phone].  If 7PM-7AM, please contact night-coverage [email or web address] Password [email or web address]  06/25/2017, 12:50 AM

## 2017-06-25 NOTE — Progress Notes (Signed)
[place] for heparin Indication: atrial fibrillation  Allergies  Allergen Reactions  . Percocet [Oxycodone-Acetaminophen] Nausea And Vomiting  . Metformin And Related Nausea Only    Upset stomach  . Invokana [Canagliflozin] Rash    Patient Measurements: Height: 5\' 11"  (180.3 cm) Weight: 252 lb 4.8 oz (114.4 kg) IBW/kg (Calculated) : 75.3 Heparin Dosing Weight: 100kg  Vital Signs: Temp: 98.2 F (36.8 C) (11/30 1956) Temp Source: Oral (11/30 1956) BP: 149/59 (11/30 1956) Pulse Rate: 69 (11/30 1956)  Labs: Recent Labs    06/24/17 2245 06/25/17 0141 06/25/17 0257 06/25/17 0835 06/25/17 0927 06/25/17 1239 06/25/17 1903  HGB 12.3*  --  11.3*  --   --   --   --   HCT 33.8*  --  33.9*  --   --   --   --   PLT 164  --  156  --   --   --   --   HEPARINUNFRC  --   --   --   --  <0.10*  --  0.39  CREATININE 1.83*  --   --  1.83*  --   --   --   TROPONINI  --  0.07*  --  0.58*  --  0.69*  --     Estimated Creatinine Clearance: 45.5 mL/min (A) (by C-G formula based on SCr of 1.83 mg/dL (H)).  Assessment: 76yo male c/o chest tightness and SOB, found to be in Afib w/ RVR, known h/o PAF, had been on Eliquis but was d/c'd ~50yr ago for GIB in setting of requiring dual-antiplatelets after MI/stent w/ plan to resume Eliquis Jan 2019, now to begin heparin; no current overt signs of active bleeding.  Hep lvl now within goal at 0.39 on 1950 units/hr  Goal of Therapy:  Heparin level 0.3-0.7 units/ml Monitor platelets by anticoagulation protocol: Yes   Plan:  Continue heparin to 1950 units/hr Daily heparin level and CBC Monitor for s/sx bleeding  Levester Fresh, PharmD, BCPS, BCCCP Clinical Pharmacist Clinical phone for 06/25/2017 from 7a-3:30p: [phone] If after 3:30p, please call main pharmacy at: [phone] 06/25/2017 8:13 PM

## 2017-06-25 NOTE — Progress Notes (Signed)
ANTICOAGULATION CONSULT NOTE - Initial Consult  Pharmacy Consult for heparin Indication: atrial fibrillation  Allergies  Allergen Reactions  . Percocet [Oxycodone-Acetaminophen] Nausea And Vomiting  . Metformin And Related Nausea Only    Upset stomach  . Invokana [Canagliflozin] Rash    Patient Measurements: Height: 5\' 11"  (180.3 cm) Weight: 255 lb (115.7 kg) IBW/kg (Calculated) : 75.3 Heparin Dosing Weight: 100kg  Vital Signs: Temp: 97.7 F (36.5 C) (11/30 0038) Temp Source: Oral (11/30 0038) BP: 146/74 (11/30 0013) Pulse Rate: 120 (11/30 0013)  Labs: Recent Labs    06/24/17 2245  HGB 12.3*  HCT 33.8*  PLT 164  CREATININE 1.83*    Estimated Creatinine Clearance: 45.8 mL/min (A) (by C-G formula based on SCr of 1.83 mg/dL (H)).   Medical History: Past Medical History:  Diagnosis Date  . Acute on chronic heart failure ([place])   . Allergy   . Arthritis   . AVM (arteriovenous malformation) of colon   . C. difficile enteritis   . CAD (coronary artery disease)    a. Cath 10/2014 - Mild to moderate diagonal, circumflex and obtuse marginal disease. Innominate artery sternosis.  . Carotid artery disease ([place])    a. Carotid dopple 03/2014 82-42% RICA &  35-36% LICA stenosis b. 1/44  . CKD (chronic kidney disease), stage III ([place])   . Clostridium difficile colitis 05/13/2016  . Dyspnea   . Dysrhythmia    ATRIAL FIBRILATION  . Elevated troponin    a. 09/2014 in setting of AF RVR-->Myoview: EF 49%, no ischemia/infarct->Med Rx.  . GERD (gastroesophageal reflux disease)   . Gout   . H/O transfusion of whole blood   . Heart murmur   . History of PFTs    PFTs 6/16:  FVC 3.73 (87%), FEV1 2.93 (88%), FEV1/FVC 78%, DLCO 69%  . Hx of adenomatous colonic polyps 07/02/2016  . Hyperlipidemia   . Hypertension   . Hypothyroidism   . Iron deficiency anemia   . PAF (paroxysmal atrial fibrillation) ([place])    a. 09/2014: Converted on Dilt;  b. CHA2DS2VASc = 3-->eliquis;  c. 09/2014  Echo: EF 55-60%, mild LVH, mildly dil LA.  [name] Personal history of colonic polyps 2007, 2008   adenoma each time, largest 12 mm in 2007  . Psoriasis   . Staph infection 07/2016   left hand   . Staphylococcus aureus bacteremia 07/31/2016  . Type II diabetes mellitus ([place])    TYPE 2    Assessment: 76yo male c/o chest tightness and SOB, found to be in Afib w/ RVR, known h/o PAF, had been on Eliquis but was d/c'd ~11yr ago for GIB in setting of requiring dual-antiplatelets after MI/stent w/ plan to resume Eliquis Jan 2019, now to begin heparin; no current overt signs of active bleeding.  Goal of Therapy:  Heparin level 0.3-0.7 units/ml Monitor platelets by anticoagulation protocol: Yes   Plan:  Will give heparin 2000 units IV bolus x1 followed by gtt at 1600 units/hr (required up to 2200 units/hr on prior admission) and monitor heparin levels and CBC.  Wynona Neat, PharmD, BCPS  06/25/2017,12:56 AM

## 2017-06-25 NOTE — ED Notes (Signed)
Pt given ice chips, ok per PA Rob

## 2017-06-25 NOTE — Progress Notes (Signed)
Inpatient Diabetes Program Recommendations  AACE/ADA: New Consensus Statement on Inpatient Glycemic Control (2015)  Target Ranges:  Prepandial:   less than 140 mg/dL      Peak postprandial:   less than 180 mg/dL (1-2 hours)      Critically ill patients:  140 - 180 mg/dL   Review of Glycemic Control  Diabetes history: DM 2 Outpatient Diabetes medications: Toujeo 52 units, Glipizide 10 mg BID Current orders for Inpatient glycemic control: Lantus 52 units, Novolog Moderate 0-15 units tid + Novolog HS scale 0-5 units  Inpatient Diabetes Program Recommendations:    Glucose 416 this am and is trending downward currently at 375 mg/dl.   Patient will need Lantus as soon as it is dispensed this am.   Patient still has active Novolog on board. Will watch trends for now glucose should really trend down after Lantus is given.  Thanks,  Tama Headings RN, MSN, [facility] Inpatient Diabetes Coordinator Team Pager [phone] (8a-5p)

## 2017-06-25 NOTE — ED Notes (Signed)
Attending MD returned call aware of troponin

## 2017-06-25 NOTE — ED Notes (Signed)
Admitting MD - Dr. Algis Liming - paged to [facility] @ [phone] to [phone].

## 2017-06-25 NOTE — ED Notes (Signed)
Patients bed changes and gown changed., Patient denies any chest pain at this time.

## 2017-06-25 NOTE — Progress Notes (Signed)
PROGRESS NOTE   ANTIONIO [name]  [facility]:[id number]    DOB: [birth year]    DOA: 06/24/2017  PCP: Cassandria Anger, MD   I have briefly reviewed patients previous medical records in [facility].  Brief Narrative:  76 year old married male with a PMH of PAF, CAD/NSTEMI status post stenting to OM1 March 2025, chronic diastolic CHF, GI bleed due to small bowel AVMs (Dr. [name]), stage III chronic kidney disease, iron deficiency anemia, GERD, HTN, HLD, hypothyroid, OSA intolerant of CPAP, type II DM/IDDM, psoriasis and recently resumed Humira, resented to the ED due to palpitations that started on night of admission. Noted in A. fib with RVR, briefly on IV Cardizem drip, reverted to sinus rhythm. On IV heparin infusion. Cardiology consulted.   Assessment & Plan:   Principal Problem:   Atrial fibrillation with rapid ventricular response (HCC) Active Problems:   DM (diabetes mellitus), type 2 with peripheral vascular complications (HCC)   Essential hypertension   Postherpetic neuralgia   Subclavian artery stenosis, right (HCC)   Hypothyroidism   Chronic diastolic CHF (congestive heart failure) (HCC)   PAD (peripheral artery disease) (HCC)   CKD (chronic kidney disease), stage III (HCC)   Atrial fibrillation with RVR (Michael [name])   1. Paroxysmal A. fib with RVR: Symptomatic. Briefly on IV Cardizem drip in ED. Reported to sinus rhythm. Cardizem drip discontinued. Continued on home dose of Cardizem 180 mg daily and metoprolol 75 MG twice a day. Patient reports that he was taken off amiodarone in June due to bradycardia issues and off of Eliquis since January 2018 due to GI bleed. Currently remains on home dose of aspirin and Plavix. IV heparin initiated in ED. As per cardiology office visit 11/27, some thought regarding discontinuing Plavix and resuming anticoagulation. Cardiology consulted for assistance. 2. Elevated troponin/CAD status post stent March 2018: No chest pain reported. Troponin 0.07 >  0.58 >0.69. Likely related to demand ischemia from A. fib with RVR. Cardiology input pending. 3. Stage III chronic kidney disease: Baseline creatinine probably in the 1.7-1.8 range. Presented with creatinine of 1.26. Stable. Monitor closely while on Lasix, now 80 mg twice a day. 4. Mild hyperkalemia: Resolved. 5. Uncontrolled type II DM/IDDM with renal complications: K2H 6 in September 2018. Reports fasting blood sugars at home ranging between 140-180 mg per DL. Presented with blood glucose 444 but no obvious DKA. Continue home Lantus 52 units in a.m., added NovoLog SSI, moderate scale and NovoLog mealtime 3 units 3 times a day. May need further adjustment. Holding oral hypoglycemics. 6. Hyperlipidemia: Continue Motrin atorvastatin. 7. Right subclavian stenosis/bilateral carotid stenosis: Stable and outpatient follow-up. 8. Chronic diastolic CHF: Volume status appears okay. Continue Lasix 80 mg twice daily. Patient does report approximately 8 pound weight gain since June. 9. Iron deficiency anemia: Hemoglobin stable. 10. History of GI bleed due to small bowel AVMs: Denies recent history of GI bleed symptoms. 11. Essential hypertension: Mildly uncontrolled. Continue hydralazine, diltiazem and metoprolol. 12. GERD: Protonix. 13. Hypothyroid: TSH normal. Continue Synthroid. 14. OSA: Intolerant and hence has not used CPAP. 15. Psoriasis: Spouse reports recent reinitiation of Humira. 16. Obesity/Body mass index is 35.19 kg/m.   DVT prophylaxis: Currently on IV heparin infusion. Code Status: Full Family Communication: Discussed in detail with patient's spouse via phone. Updated care and answered questions. Disposition: DC home when medically improved.   Consultants:  Cardiology-pending   Procedures:  None  Antimicrobials:  None    Subjective: Seen this morning while still in ED. Feels much better.  Heart not "vibrating anymore". No chest pain or dyspnea reported. Reports gradual weight  gain since June. Denies melena or bleeding anywhere.   ROS: No dizziness, lightheadedness or passing out.  Objective:  Vitals:   06/25/17 1045 06/25/17 1100 06/25/17 1143 06/25/17 1242  BP: (!) 161/66 (!) 138/47  (!) 142/61  Pulse: 63 61  60  Resp: 14 14  18   Temp:   98 F (36.7 C) 97.7 F (36.5 C)  TempSrc:   Oral Oral  SpO2: 98% 97%  97%  Weight:    114.4 kg (252 lb 4.8 oz)  Height:    5\' 11"  (1.803 m)    Examination:  General exam: Elderly male, moderately built and obese, lying comfortably propped up in bed. Respiratory system: Clear to auscultation. Respiratory effort normal. Cardiovascular system: S1 & S2 heard, RRR. No JVD, murmurs, rubs, gallops or clicks. 1+ pedal edema. Telemetry: Sinus rhythm/sinus bradycardia in the 50s. Gastrointestinal system: Abdomen is nondistended, soft and nontender. No organomegaly or masses felt. Normal bowel sounds heard. Central nervous system: Alert and oriented. No focal neurological deficits. Extremities: Symmetric 5 x 5 power. Skin: Healed scars over both legs. Psychiatry: Judgement and insight appear normal. Mood & affect appropriate.     Data Reviewed: I have personally reviewed following labs and imaging studies  CBC: Recent Labs  Lab 06/24/17 2245 06/25/17 0257  WBC 5.0 4.6  NEUTROABS 3.8  --   HGB 12.3* 11.3*  HCT 33.8* 33.9*  MCV 85.8 90.2  PLT 164 297   Basic Metabolic Panel: Recent Labs  Lab 06/21/17 0918 06/24/17 2245 06/25/17 0835  NA 132* 131* 131*  K 4.8 5.3* 4.5  CL 96 100* 100*  CO2 20 21* 22  GLUCOSE 444* 355* 398*  BUN 37* 36* 37*  CREATININE 1.75* 1.83* 1.83*  CALCIUM 9.0 8.7* 8.3*   Cardiac Enzymes: Recent Labs  Lab 06/25/17 0141 06/25/17 0835 06/25/17 1239  TROPONINI 0.07* 0.58* 0.69*   HbA1C: No results for input(s): HGBA1C in the last 72 hours. CBG: Recent Labs  Lab 06/24/17 2218 06/25/17 0848 06/25/17 0942 06/25/17 1148 06/25/17 1235  GLUCAP 347* 416* 375* 324* 315*     No results found for this or any previous visit (from the past 240 hour(s)).       Radiology Studies: Dg Chest 2 View  Result Date: 06/24/2017 CLINICAL DATA:  Chest pain. EXAM: CHEST  2 VIEW COMPARISON:  Most recent radiograph 02/05/2017 FINDINGS: Cardiomegaly, similar to decreased from prior year. Unchanged mediastinal contours with aortic atherosclerosis. Persistent left pleural effusion, but decreased from prior exam. Mild central pulmonary edema. No new airspace disease or pneumothorax. No acute osseous abnormality. IMPRESSION: Cardiomegaly with mild pulmonary edema and small left pleural effusion, improved from most recent comparison. Electronically Signed   By: Jeb Levering M.D.   On: 06/24/2017 23:10        Scheduled Meds: . allopurinol  200 mg Oral Daily  . aspirin  325 mg Oral Daily  . atorvastatin  40 mg Oral q1800  . clopidogrel  75 mg Oral Daily  . diltiazem  180 mg Oral Daily  . fenofibrate  160 mg Oral Daily  . ferrous sulfate  325 mg Oral TID WC  . furosemide  80 mg Oral BID  . gabapentin  100 mg Oral BID  . hydrALAZINE  25 mg Oral TID  . insulin aspart  0-15 Units Subcutaneous TID WC  . insulin aspart  0-5 Units Subcutaneous QHS  . insulin glargine  52 Units Subcutaneous QAC breakfast  . levothyroxine  150 mcg Oral QAC breakfast  . metoprolol tartrate  75 mg Oral BID  . pantoprazole  40 mg Oral Daily   Continuous Infusions: . diltiazem (CARDIZEM) infusion Stopped (06/25/17 0653)  . heparin 1,950 Units/hr (06/25/17 1116)     LOS: 0 days     Vernell Leep, MD, FACP, [facility]. [facility] Pager [phone]  If 7PM-7AM, please contact night-coverage [email or web address] Password [email or web address] 06/25/2017, 2:31 PM

## 2017-06-25 NOTE — ED Notes (Signed)
Spoke with Admitting Md patient HR 60 , can with drip on lowest dose, can patient come off the drip and do oral and then Go to Tele Bed,

## 2017-06-25 NOTE — Care Management Obs Status (Signed)
[place] NOTIFICATION   Patient Details  Name: Michael [name] MRN: [id number] Date of Birth: [birth year]   Medicare Observation Status Notification Given:  Yes    Carles Collet, RN 06/25/2017, 1:18 PM

## 2017-06-25 NOTE — ED Notes (Signed)
Bedplacement made aware patient bed request is now Tele.

## 2017-06-25 NOTE — ED Notes (Signed)
Per lab blood sent for last BMP & Troponin cannot be ran at this time, this RN to recollect and send to lab

## 2017-06-25 NOTE — Consult Note (Signed)
Cardiology Consultation:   Patient ID: [name]; [id number]; 24-Aug-1940   Admit date: 06/24/2017 Date of Consult: 06/25/2017  Primary Care Provider: Cassandria Anger, MD Primary Cardiologist: Dr. Johnsie [name] Primary Electrophysiologist:  NA   Patient Profile:   Michael [name] is a 75 y.o. male with a hx of PAF (not on anticoagulation therapy secondary to hx of GI bleeding- SB AVMs), CAD s/p DES to OM1 80%>0% (09/2016) on dual antiplatelet therapy (ASA. Plavix), diffuse-mild/moderate nonobstructive CAD in the LAD, CIRC, and RCA, diastolic heart failure, chronic kidney diease stage III, and anemia who is being seen today for the evaluation of atrial fibrillation with RVR at the request of Dr. Hal [name].  History of Present Illness:   In the past (09/2014) he was admitted with AF with RVR in which converted to NSR. During that time he had elevated Troponin levels felt to be secondary to demand ischemia. EF was normal on echocardiogram 55-60% and stress nuclear study demonstrated no ischemia. He was then placed on Eliquis for anticoagulation. He was again admitted in 10/2014 with AF with RVR and congestive heart failure. He was placed on Amiodarone and converted to NSR. He had elevated Troponin levels again and a LHC demonstrated mild to mod CAD in the Dx, LCx, OM. Pt underwent carotid US and upper extremity arterial duplex which revealed bilateral ICA stenosis 40-59%. He is followed by Dr. Fletcher [name] for this.   Unfortunately 07/28/2016-08/03/2016, Michael [name] suffered a NSTEMI and was found to have 80% stenosis in the circumflex marginal 1 vessel for which he underwent successful stenting. He had concomitant CAD in the LAD, diagonal, AV groove circumflex and RCA. Plan was to start him on triple therapy for one month, then stop his ASA.  Readmitted 1/15-1/21/18 with acute on chronic diastolic CHF in the setting of worsening (heme+) anemia.  He required transfusion with PRBCs x 1.  His Eliquis was held.  He developed AKI with diuresis. Capsule endoscopy by gastroenterology demonstrated multiple small AVMs in the mid small bowel which was the likely source of his anemia. He was continued on dual antiplatelet therapy given recent PCI with DES the setting of ACS. Eliquis was held at discharge.    It appears that his amiodarone 262m PO QD was stopped sometime between 09/2016 and 10/2016 of this year.   He additionally had a sleep study in 10/2016 in which he was found to have mild sleep apnea and CPAP titration was recommended but he never followed up with this.   Pt was in his normal state of health prior to this admission when he was at home laying on the couch yesterday evening and began feeling a fluttering in his chest. He has felt this sensation before when he has gone into afib in the past. He had some mild chest pressure, but otherwise no other associated symptoms. His chest pressure did not radiate to his hands, jaw, or back. He had his wife take his BP at that time which was 160's/90's with a HR in the 90's. Pt takes PO diltiazem and metoprolol with great complaince. It appears that he had been prescribed amiodarone at one point, but this has been discontinued for unknown reasons. He denies any unusual swelling or dyspnea at rest or on exertion. He denies recent illness, no N/V/D.   In the ED he was started on a diltiazem and heparin gtt. He subsequently converted to NSR. The diltiazem gtt was stopped and the pt was placed back on his PO  diltiazem dose of 12m QD and metoprolol 754mBID.    Past Medical History:  Diagnosis Date  . Acute on chronic heart failure ([facility]  . Allergy   . Arthritis   . AVM (arteriovenous malformation) of colon   . C. difficile enteritis   . CAD (coronary artery disease)    a. Cath 10/2014 - Mild to moderate diagonal, circumflex and obtuse marginal disease. Innominate artery sternosis.  . Carotid artery disease ([place]   a. Carotid dopple 03/2014 40-59% RICA &   6055-97%ICA stenosis b. 10/29/14. CKD (chronic kidney disease), stage III ([place]  . Clostridium difficile colitis 05/13/2016  . Dyspnea   . Dysrhythmia    ATRIAL FIBRILATION  . Elevated troponin    a. 09/2014 in setting of AF RVR-->Myoview: EF 49%, no ischemia/infarct->Med Rx.  . GERD (gastroesophageal reflux disease)   . Gout   . H/O transfusion of whole blood   . Heart murmur   . History of PFTs    PFTs 6/16:  FVC 3.73 (87%), FEV1 2.93 (88%), FEV1/FVC 78%, DLCO 69%  . Hx of adenomatous colonic polyps 07/02/2016  . Hyperlipidemia   . Hypertension   . Hypothyroidism   . Iron deficiency anemia   . PAF (paroxysmal atrial fibrillation) ([place]   a. 09/2014: Converted on Dilt;  b. CHA2DS2VASc = 3-->eliquis;  c. 09/2014 Echo: EF 55-60%, mild LVH, mildly dil LA.  . Marland Kitchenersonal history of colonic polyps 2007, 2008   adenoma each time, largest 12 mm in 2007  . Psoriasis   . Staph infection 07/2016   left hand   . Staphylococcus aureus bacteremia 07/31/2016  . Type II diabetes mellitus ([place]   TYPE 2    Past Surgical History:  Procedure Laterality Date  . APPENDECTOMY  02/09/2014  . CARDIAC CATHETERIZATION N/A 07/29/2016   Procedure: Right/Left Heart Cath and Coronary Angiography;  Surgeon: Michael BushMD;  [facility];  Service: Cardiovascular;  Laterality: N/A;  . CARDIAC CATHETERIZATION N/A 07/29/2016   Procedure: Coronary Stent Intervention;  Surgeon: Michael BushMD;  [facility];  Service: Cardiovascular;  Laterality: N/A;  . COLONOSCOPY  02/10/2011   internal hemorrhoids  . COLONOSCOPY W/ POLYPECTOMY  04/09/2006   12 mm adenoma  . COLONOSCOPY W/ POLYPECTOMY  06/17/2007   5 mm adenoma  . COLONOSCOPY WITH PROPOFOL N/A 05/13/2016   Procedure: COLONOSCOPY WITH PROPOFOL;  Surgeon: Michael GunningMD;  [facility];  Service: Gastroenterology;  Laterality: N/A;  . CORONARY STENT PLACEMENT  07/29/2016   OMI    DES  . ENTEROSCOPY N/A  08/14/2016   Procedure: ENTEROSCOPY;  Surgeon: Michael GunningMD;  [facility];  Service: Gastroenterology;  Laterality: N/A;  . ESOPHAGOGASTRODUODENOSCOPY  12/23/2011   Procedure: ESOPHAGOGASTRODUODENOSCOPY (EGD);  Surgeon: JoIrene ShipperMD;  [facility];  Service: Endoscopy;  Laterality: N/A;  with small bowel bx's  . GIVENS CAPSULE STUDY  12/28/2011  . GIVENS CAPSULE STUDY N/A 08/14/2016   Procedure: GIVENS CAPSULE STUDY;  Surgeon: Michael GunningMD;  Location: [facility] Service: Gastroenterology;  Laterality: N/A;  . KNEE ARTHROSCOPY Right   . LAPAROSCOPIC APPENDECTOMY N/A 02/09/2014   Procedure: APPENDECTOMY LAPAROSCOPIC;  Surgeon: AlLeighton RuffMD;  Location: [facility];  Service: General;  Laterality: N/A;  . LEFT HEART CATHETERIZATION WITH CORONARY ANGIOGRAM N/A 11/15/2014   Procedure: LEFT HEART CATHETERIZATION WITH CORONARY ANGIOGRAM;  Surgeon: HeBelva CromeMD;  Location: [place] [facility];  Service: Cardiovascular;  Laterality: N/A;  . ROTATOR CUFF REPAIR     left     Prior to Admission medications   Medication Sig Start Date End Date Taking? Authorizing Provider  allopurinol (ZYLOPRIM) 100 MG tablet Take 2 tablets (200 mg total) by mouth daily. 04/28/17  Yes [name], Evie Lacks, MD  aspirin 81 MG chewable tablet Chew 1 tablet (81 mg total) by mouth daily. 04/28/17  Yes [name], Evie Lacks, MD  atorvastatin (LIPITOR) 40 MG tablet Take 1 tablet (40 mg total) by mouth daily. 04/28/17  Yes [name], Evie Lacks, MD  b complex vitamins tablet Take 1 tablet by mouth daily. 04/14/17  Yes [name], Evie Lacks, MD  Cholecalciferol (VITAMIN D-3) 1000 units CAPS Take 1,000-5,000 Units by mouth See admin instructions. 1,000 units once a day on Sun/Mon/Tues/Wed/Thurs/Fri and 5,000 units on Sat   Yes [provider]  clopidogrel (PLAVIX) 75 MG tablet Take 1 tablet (75 mg total) by mouth daily with breakfast. 05/03/17  Yes Josue Hector, MD  diltiazem  (CARTIA XT) 180 MG 24 hr capsule Take 1 capsule (180 mg total) by mouth daily. 05/04/17  Yes Wellington Hampshire, MD  fenofibrate 160 MG tablet TAKE 1 TABLET(160 MG) BY MOUTH DAILY Patient taking differently: Take 160 mg by mouth daily.  05/24/17  Yes Josue Hector, MD  ferrous sulfate 325 (65 FE) MG tablet Take 325 mg by mouth 3 (three) times daily with meals.   Yes [provider]  furosemide (LASIX) 40 MG tablet Take 1 tablet (40 mg total) by mouth 2 (two) times daily. Patient taking differently: Take 80 mg by mouth 2 (two) times daily.  05/24/17  Yes Josue Hector, MD  gabapentin (NEURONTIN) 100 MG capsule Take 1 capsule (100 mg total) by mouth 2 (two) times daily. 04/28/17  Yes [name], Evie Lacks, MD  glipiZIDE (GLUCOTROL) 10 MG tablet Take 1 tablet (10 mg total) by mouth 2 (two) times daily before a meal. 04/28/17  Yes [name], Evie Lacks, MD  hydrALAZINE (APRESOLINE) 25 MG tablet Take 1 tablet (25 mg total) by mouth 3 (three) times daily. 04/28/17  Yes [name], Evie Lacks, MD  HYDROcodone-acetaminophen (NORCO/VICODIN) 5-325 MG tablet Take 1 tablet by mouth 2 (two) times daily as needed for moderate pain. 08/28/16 08/28/17 Yes [name], Evie Lacks, MD  Insulin Glargine (TOUJEO SOLOSTAR) 300 UNIT/ML SOPN Inject 52 Units into the skin daily. Patient taking differently: Inject 52 Units into the skin daily before breakfast.  05/04/17  Yes [name], Evie Lacks, MD  levothyroxine (SYNTHROID, LEVOTHROID) 150 MCG tablet take 1 tablet by mouth daily BEFORE BREAKFAST Patient taking differently: Take 150 mcg by mouth daily before breakfast.  04/28/17  Yes [name], Evie Lacks, MD  metoprolol tartrate (LOPRESSOR) 25 MG tablet Take 3 tablets (75 mg total) by mouth 2 (two) times daily. 05/03/17  Yes Josue Hector, MD  omeprazole (PRILOSEC) 40 MG capsule Take 1 capsule (40 mg total) by mouth daily. 04/28/17  Yes [name], Evie Lacks, MD  potassium chloride SA (K-DUR,KLOR-CON) 20 MEQ tablet Take 1 tablet  (20 mEq total) by mouth daily. 04/28/17  Yes [name], Evie Lacks, MD  Blood Glucose Monitoring Suppl (ONE TOUCH ULTRA 2) W/DEVICE KIT Use as directed Dx E11.9 05/18/14   [name], Evie Lacks, MD  Blood Pressure Monitor KIT Use to check blood pressure daily Dx I10 10/04/15   [name], Evie Lacks, MD  glucose blood (ONE TOUCH ULTRA TEST) test strip 1 each by  Other route 4 (four) times daily. Use to check blood sugar four times a day  Dx: E11.9- insulin Dependant 05/17/17   [name], Evie Lacks, MD  Insulin Pen Needle 31G X 5 MM MISC 1 Units by Does not apply route 4 (four) times daily. 03/17/16   [name], Evie Lacks, MD  Lancets [facility] ULTRASOFT) lancets Use to check blood sugars daily 05/18/14   [name], Evie Lacks, MD  nitroGLYCERIN (NITROSTAT) 0.4 MG SL tablet Place 1 tablet (0.4 mg total) under the tongue every 5 (five) minutes as needed for chest pain. 05/27/17 08/25/17  Josue Hector, MD    Inpatient Medications: Scheduled Meds: . allopurinol  200 mg Oral Daily  . aspirin  325 mg Oral Daily  . atorvastatin  40 mg Oral q1800  . clopidogrel  75 mg Oral Daily  . diltiazem  180 mg Oral Daily  . fenofibrate  160 mg Oral Daily  . ferrous sulfate  325 mg Oral TID WC  . furosemide  80 mg Oral BID  . gabapentin  100 mg Oral BID  . hydrALAZINE  25 mg Oral TID  . insulin aspart  0-15 Units Subcutaneous TID WC  . insulin aspart  0-5 Units Subcutaneous QHS  . insulin glargine  52 Units Subcutaneous QAC breakfast  . levothyroxine  150 mcg Oral QAC breakfast  . metoprolol tartrate  75 mg Oral BID  . pantoprazole  40 mg Oral Daily   Continuous Infusions: . diltiazem (CARDIZEM) infusion Stopped (06/25/17 0653)  . heparin 1,950 Units/hr (06/25/17 1116)   PRN Meds: acetaminophen **OR** acetaminophen, HYDROcodone-acetaminophen, nitroGLYCERIN, ondansetron **OR** ondansetron (ZOFRAN) IV  Allergies:    Allergies  Allergen Reactions  . Percocet [Oxycodone-Acetaminophen] Nausea And Vomiting    . Metformin And Related Nausea Only    Upset stomach  . Invokana [Canagliflozin] Rash    Social History:   Social History   Socioeconomic History  . Marital status: Married    Spouse name: Not on file  . Number of children: Not on file  . Years of education: 19  . Highest education level: Not on file  Social Needs  . Financial resource strain: Not on file  . Food insecurity - worry: Not on file  . Food insecurity - inability: Not on file  . Transportation needs - medical: Not on file  . Transportation needs - non-medical: Not on file  Occupational History  . Occupation:  Retired  Tobacco Use  . Smoking status: Former Smoker    Packs/day: 0.70    Years: 48.00    Pack years: 33.60    Types: Cigarettes    Last attempt to quit: 03/29/2006    Years since quitting: 11.2  . Smokeless tobacco: Never Used  Substance and Sexual Activity  . Alcohol use: Yes    Alcohol/week: 0.6 oz    Types: 1 Cans of beer per week    Comment: occasional alcohol intake  . Drug use: No  . Sexual activity: Not Currently  Other Topics Concern  . Not on file  Social History Narrative   Regular exercise-no   Caffeine Use-yes    Family History:   Family History  Problem Relation Age of Onset  . Heart attack Father   . Lung cancer Father   . Diabetes Father   . Stroke Father   . Hypertension Father   . Stroke Mother   . Hypertension Mother   . Cancer Brother        liver  . Heart  disease Brother        chf  . COPD Sister   . Malignant hyperthermia Neg Hx    Family Status:  Family Status  Relation Name Status  . Father  Deceased  . Mother  Deceased  . Brother  Deceased  . Brother  Alive  . MGM  Deceased  . MGF  Deceased  . PGM  Deceased  . PGF  Deceased  . Sister  (Not Specified)  . Neg Hx  (Not Specified)    ROS:  Please see the history of present illness.  All other ROS reviewed and negative.     Physical Exam/Data:   Vitals:   06/25/17 1045 06/25/17 1100 06/25/17 1143  06/25/17 1242  BP: (!) 161/66 (!) 138/47  (!) 142/61  Pulse: 63 61  60  Resp: _0 Temp:   98 F (36.7 C) 97.7 F (36.5 C)  TempSrc:   Oral Oral  SpO2: 98% 97%  97%  Weight:    252 lb 4.8 oz (114.4 kg)  Height:    _1  (1.803 m)    Intake/Output Summary (Last 24 hours) at 06/25/2017 1244 Last data filed at 06/25/2017 0752 Gross per 24 hour  Intake -  Output 200 ml  Net -200 ml   Filed Weights   06/24/17 2215 06/25/17 1242  Weight: 255 lb (115.7 kg) 252 lb 4.8 oz (114.4 kg)   Body mass index is 35.19 kg/m.   General: Well developed, well nourished, NAD Skin: Warm, dry, intact  Head: Normocephalic, atraumatic, clear, moist mucus membranes. Neck: Negative for carotid bruits. No JVD Lungs:Clear to ausculation bilaterally. No wheezes, rales, or rhonchi. Breathing is unlabored. Cardiovascular: RRR with S1 S2. Systolic murmur 2/6 upper right sternal border. No rubs or gallops.  Abdomen: Soft, non-tender, non-distended with normoactive bowel sounds.  No obvious abdominal masses. MSK: Strength and tone appear normal for age. 5/5 in all extremities Extremities: No edema. No clubbing or cyanosis. DP/PT pulses 2+ bilaterally Neuro: Alert and oriented. No focal deficits. No facial asymmetry. MAE spontaneously. Psych: Responds to questions appropriately with normal affect.     EKG:  The EKG was personally reviewed and demonstrates: 11/29 Aflutter/Afib 134  Telemetry: 11/30 Telemetry was personally reviewed and demonstrates: NSR 62  Relevant CV Studies:  [facility] 07/29/16 LAD proximal 40, mid 10, D1 60 LCx proximal 30, distal 30, OM1 80 RA mean 9, RV 43/11, PA 44/12, mean 26; PCWP 18 AV mean gradient 12, calculated AVA 1.78 cm PCI: 2.25 x 38 mm Synergy DES to the OM1  Echo 08/01/16 Severe LVH, EF 60-65, normal wall motion, grade 2 diastolic dysfunction, moderate aortic stenosis (mean 20, peak 37), MAC, mild LAE, PASP 53  Echo 12/17 Moderate concentric LVH, EF 60-65,  normal wall motion, grade 1 diastolic dysfunction, moderate aortic stenosis (mean 26), mild LAE, mild TR, PASP 52  Carotid US 5/17 Bilateral ICA 40-59, mild right subclavian steal Follow-up 1 year  LHC 4/16 LM: widely patent. LAD: widely patent; D1 50-70%  LCx: Patent. OM2 proximal eccentric 50-70%; mid CFX after origin of OM2 50% RI: widely patent. RCA: widely patent.. INNOMINATE ANGIOGRAPHY:There is heavy calcification with at least 50% obstruction in the innominate artery LV gram EF 65%..  Echo 3/16 Mild LVH, EF 55-60, mild LAE  Myoview 3/16 No ischemia or scar, EF 49.  Intermediate Risk  Laboratory Data:  Chemistry Recent Labs  Lab 06/21/17 0918 06/24/17 2245 06/25/17 0835  NA 132* 131* 131*  K  4.8 5.3* 4.5  CL 96 100* 100*  CO2 20 21* 22  GLUCOSE 444* 355* 398*  BUN 37* 36* 37*  CREATININE 1.75* 1.83* 1.83*  CALCIUM 9.0 8.7* 8.3*  GFRNONAA 38* 35* 35*  GFRAA 43* 40* 40*  ANIONGAP  --  10 9    Total Protein  Date Value Ref Range Status  08/10/2016 6.6 6.5 - 8.1 g/dL Final  07/26/2014 7.6 6.4 - 8.3 g/dL Final   Albumin  Date Value Ref Range Status  08/10/2016 2.7 (L) 3.5 - 5.0 g/dL Final  07/26/2014 3.7 3.5 - 5.0 g/dL Final   AST  Date Value Ref Range Status  08/10/2016 23 15 - 41 U/L Final  07/26/2014 13 5 - 34 U/L Final   ALT  Date Value Ref Range Status  08/10/2016 9 (L) 17 - 63 U/L Final  07/26/2014 13 0 - 55 U/L Final   Alkaline Phosphatase  Date Value Ref Range Status  08/10/2016 67 38 - 126 U/L Final  07/26/2014 77 40 - 150 U/L Final   Total Bilirubin  Date Value Ref Range Status  08/10/2016 0.6 0.3 - 1.2 mg/dL Final  07/26/2014 0.67 0.20 - 1.20 mg/dL Final   Hematology Recent Labs  Lab 06/24/17 2245 06/25/17 0257  WBC 5.0 4.6  RBC 3.94* 3.76*  HGB 12.3* 11.3*  HCT 33.8* 33.9*  MCV 85.8 90.2  MCH 31.2 30.1  MCHC 36.4* 33.3  RDW 16.0* 15.7*  PLT 164 156   Cardiac Enzymes Recent Labs  Lab 06/25/17 0141  06/25/17 0835  TROPONINI 0.07* 0.58*    Recent Labs  Lab 06/24/17 2252  TROPIPOC 0.02    BNP Recent Labs  Lab 06/24/17 2326  BNP 180.4*    DDimer No results for input(s): DDIMER in the last 168 hours. TSH:  Lab Results  Component Value Date   TSH 3.293 06/25/2017   Lipids: Lab Results  Component Value Date   CHOL 101 08/01/2016   HDL 22 (L) 08/01/2016   LDLCALC 10 08/01/2016   LDLDIRECT 26.0 03/20/2015   TRIG 344 (H) 08/01/2016   CHOLHDL 4.6 08/01/2016   HgbA1c: Lab Results  Component Value Date   HGBA1C 6.0 04/14/2017    Radiology/Studies:  Dg Chest 2 View  Result Date: 06/24/2017 CLINICAL DATA:  Chest pain. EXAM: CHEST  2 VIEW COMPARISON:  Most recent radiograph 02/05/2017 FINDINGS: Cardiomegaly, similar to decreased from prior year. Unchanged mediastinal contours with aortic atherosclerosis. Persistent left pleural effusion, but decreased from prior exam. Mild central pulmonary edema. No new airspace disease or pneumothorax. No acute osseous abnormality. IMPRESSION: Cardiomegaly with mild pulmonary edema and small left pleural effusion, improved from most recent comparison. Electronically Signed   By: Jeb Levering M.D.   On: 06/24/2017 23:10    Assessment and Plan:   1. Afib with RVR: -NSR  -Diltiazem gtt stopped in ED -Will continue PO diltiazem to 158m PO QD -Continue lopressor 774mPO BID -Continue ASA and plavix given recent DES 07/2016 -Consider stopping Plavix after 1 year post-stent placement  and starting on Eliquis? Can discuss this further with GI if necessary.  -Will add Amiodarone 20073mO BID -He has tried Amiodarone in the past with subsequent bradycardia. This was stopped in March/April of this year.  -At discharge, will begin decreasing metoprolol and diltiazem as we increase his amiodarone. At this point, would lean more toward rhythm control as opposed to rate control.  -Will need close follow up as an outpatient during this  transition -  CHADS2-VASc=5 (CHF, vascular dz, HTN, DM, > 41 yo)  2. CAD: -Given recent stent placement, chest pressure and mildly elevated Troponin levels, will schedule a Lexiscan Stress test to evaluate for re-stenosis given the timeframe.  -Continue ASA, Plavix, metoprolol and lipitor for risk reduction.  -Monitor troponins (0.02, 0.07, 0.58, 0.69)   3. Chronic diastolic heart failure: -Continue lasix 14m PO BID -Will monitor renal function due chronic kidney disease stage III -BUN/creatitnine currently 37/1.83 -Volume appears ok today- weight 255lb 11/29, 252lb 11/30  4. HLD: -Continue atorvastatin  5. Hypothyroid: -TSH normal today. Continue synthroid  Principal Problem:   Atrial fibrillation with rapid ventricular response (HCC) Active Problems:   DM (diabetes mellitus), type 2 with peripheral vascular complications (HCC)   Essential hypertension   Postherpetic neuralgia   Subclavian artery stenosis, right (HCC)   Hypothyroidism   Chronic diastolic CHF (congestive heart failure) (HCC)   PAD (peripheral artery disease) (HCC)   CKD (chronic kidney disease), stage III (HCC)   Atrial fibrillation with RVR ([facility]  For questions or updates, please contact [facility] Please consult [email or web address] for contact info under Cardiology/STEMI.   Signed, JKathyrn Drown NP  06/25/2017 12:44 PM   I have seen and examined the patient along with JKathyrn Drown NP.  I have reviewed the chart, notes and new data.  I agree with NP's note.   Key new complaints: palpitations clearly preceded his dyspnea and chest pain on the day of admission, suggesting that the AFib was the trigger for the other complaints. Symptom onset was at rest. However, he had been feeling very "tired" for a few days before that. Reports that his amiodarone was stopped in July due to excessive (but asymptomatic) bradycardia. Key examination changes: obese, lying fully flat and looking comfortable. Back in regular  rhythm. Coarse early peaking systolic ejection murmur. No edema Key new findings / data: afib w RVR on ECG, now back in NSR 70 bpm on monitor  PLAN: Unfortunately, not a good candidate for anticoagulation due to small bowel AVM related bleeding in the past. Definitely not while he is still on dual antiplatelet therapy (indicated through next January since his DES was implanted for NSTEMI). Clearly he will have more AFib. Suggest that we use amiodarone for both rate control and rhythm maintenance. To avoid bradycardia will reduce his AV node blocking agents. I would discontinue his diltiazem after a few days of amiodarone. Although the arrhythmia seems to be the driving event, he has had some fatigue and nonspecific symptoms these last few days, troponin is mildly elevated and he is in the time frame for possible in stent restenosis. Plan [facility].  MSanda Klein MD, FHyattsville([phone]/30/2018, 3:46 PM

## 2017-06-25 NOTE — Progress Notes (Signed)
[place] for heparin Indication: atrial fibrillation  Allergies  Allergen Reactions  . Percocet [Oxycodone-Acetaminophen] Nausea And Vomiting  . Metformin And Related Nausea Only    Upset stomach  . Invokana [Canagliflozin] Rash    Patient Measurements: Height: 5\' 11"  (180.3 cm) Weight: 255 lb (115.7 kg) IBW/kg (Calculated) : 75.3 Heparin Dosing Weight: 100kg  Vital Signs: Temp: 97.7 F (36.5 C) (11/30 0038) Temp Source: Oral (11/30 0038) BP: 164/62 (11/30 0934) Pulse Rate: 65 (11/30 0934)  Labs: Recent Labs    06/24/17 2245 06/25/17 0141 06/25/17 0257 06/25/17 0835 06/25/17 0927  HGB 12.3*  --  11.3*  --   --   HCT 33.8*  --  33.9*  --   --   PLT 164  --  156  --   --   HEPARINUNFRC  --   --   --   --  <0.10*  CREATININE 1.83*  --   --  1.83*  --   TROPONINI  --  0.07*  --  0.58*  --     Estimated Creatinine Clearance: 45.8 mL/min (A) (by C-G formula based on SCr of 1.83 mg/dL (H)).   Medical History: Past Medical History:  Diagnosis Date  . Acute on chronic heart failure ([place])   . Allergy   . Arthritis   . AVM (arteriovenous malformation) of colon   . C. difficile enteritis   . CAD (coronary artery disease)    a. Cath 10/2014 - Mild to moderate diagonal, circumflex and obtuse marginal disease. Innominate artery sternosis.  . Carotid artery disease ([place])    a. Carotid dopple 03/2014 36-46% RICA &  80-32% LICA stenosis b. 1/22  . CKD (chronic kidney disease), stage III ([place])   . Clostridium difficile colitis 05/13/2016  . Dyspnea   . Dysrhythmia    ATRIAL FIBRILATION  . Elevated troponin    a. 09/2014 in setting of AF RVR-->Myoview: EF 49%, no ischemia/infarct->Med Rx.  . GERD (gastroesophageal reflux disease)   . Gout   . H/O transfusion of whole blood   . Heart murmur   . History of PFTs    PFTs 6/16:  FVC 3.73 (87%), FEV1 2.93 (88%), FEV1/FVC 78%, DLCO 69%  . Hx of adenomatous colonic polyps 07/02/2016  .  Hyperlipidemia   . Hypertension   . Hypothyroidism   . Iron deficiency anemia   . PAF (paroxysmal atrial fibrillation) ([place])    a. 09/2014: Converted on Dilt;  b. CHA2DS2VASc = 3-->eliquis;  c. 09/2014 Echo: EF 55-60%, mild LVH, mildly dil LA.  [name] Personal history of colonic polyps 2007, 2008   adenoma each time, largest 12 mm in 2007  . Psoriasis   . Staph infection 07/2016   left hand   . Staphylococcus aureus bacteremia 07/31/2016  . Type II diabetes mellitus ([place])    TYPE 2    Assessment: 76yo male c/o chest tightness and SOB, found to be in Afib w/ RVR, known h/o PAF, had been on Eliquis but was d/c'd ~68yr ago for GIB in setting of requiring dual-antiplatelets after MI/stent w/ plan to resume Eliquis Jan 2019, now to begin heparin; no current overt signs of active bleeding.  Initial heparin level undetectable. Hg down to 11.3, plt wnl. No bleed or IV line issues per discussion with RN.  Goal of Therapy:  Heparin level 0.3-0.7 units/ml Monitor platelets by anticoagulation protocol: Yes   Plan:  Will give heparin 1500 unit bolus x1 - smaller bolus with  history of bleeding Increase heparin to 1950 units/hr 8h heparin level Daily heparin level and CBC Monitor for s/sx bleeding  Elicia Lamp, PharmD, BCPS Clinical Pharmacist Rx Phone # for today: [phone] After 3:30PM, please call Main Rx: [phone] 06/25/2017 10:36 AM

## 2017-06-25 NOTE — ED Notes (Signed)
Per [place], MD pt to receive 15 units Novolog and monitor CBG

## 2017-06-25 NOTE — ED Notes (Signed)
Pt CBG was 375, notified Wendy(RN)

## 2017-06-26 ENCOUNTER — Observation Stay (HOSPITAL_BASED_OUTPATIENT_CLINIC_OR_DEPARTMENT_OTHER): Payer: Medicare Other

## 2017-06-26 DIAGNOSIS — I480 Paroxysmal atrial fibrillation: Secondary | ICD-10-CM | POA: Diagnosis not present

## 2017-06-26 DIAGNOSIS — E1151 Type 2 diabetes mellitus with diabetic peripheral angiopathy without gangrene: Secondary | ICD-10-CM | POA: Diagnosis not present

## 2017-06-26 DIAGNOSIS — N183 Chronic kidney disease, stage 3 (moderate): Secondary | ICD-10-CM

## 2017-06-26 DIAGNOSIS — I5032 Chronic diastolic (congestive) heart failure: Secondary | ICD-10-CM | POA: Diagnosis not present

## 2017-06-26 DIAGNOSIS — R079 Chest pain, unspecified: Secondary | ICD-10-CM

## 2017-06-26 DIAGNOSIS — I4891 Unspecified atrial fibrillation: Secondary | ICD-10-CM | POA: Diagnosis not present

## 2017-06-26 LAB — GLUCOSE, CAPILLARY
GLUCOSE-CAPILLARY: 295 mg/dL — AB (ref 65–99)
GLUCOSE-CAPILLARY: 337 mg/dL — AB (ref 65–99)
GLUCOSE-CAPILLARY: 240 mg/dL — AB (ref 65–99)

## 2017-06-26 LAB — BASIC METABOLIC PANEL WITH GFR
Anion gap: 9 (ref 5–15)
BUN: 37 mg/dL — ABNORMAL HIGH (ref 6–20)
CO2: 26 mmol/L (ref 22–32)
Calcium: 8.6 mg/dL — ABNORMAL LOW (ref 8.9–10.3)
Chloride: 100 mmol/L — ABNORMAL LOW (ref 101–111)
Creatinine, Ser: 2.06 mg/dL — ABNORMAL HIGH (ref 0.61–1.24)
GFR calc Af Amer: 35 mL/min — ABNORMAL LOW
GFR calc non Af Amer: 30 mL/min — ABNORMAL LOW
Glucose, Bld: 299 mg/dL — ABNORMAL HIGH (ref 65–99)
Potassium: 4.4 mmol/L (ref 3.5–5.1)
Sodium: 135 mmol/L (ref 135–145)

## 2017-06-26 LAB — CBC
HCT: 31.9 % — ABNORMAL LOW (ref 39.0–52.0)
Hemoglobin: 10.7 g/dL — ABNORMAL LOW (ref 13.0–17.0)
MCH: 29.3 pg (ref 26.0–34.0)
MCHC: 33.5 g/dL (ref 30.0–36.0)
MCV: 87.4 fL (ref 78.0–100.0)
Platelets: 118 K/uL — ABNORMAL LOW (ref 150–400)
RBC: 3.65 MIL/uL — ABNORMAL LOW (ref 4.22–5.81)
RDW: 16.1 % — ABNORMAL HIGH (ref 11.5–15.5)
WBC: 3.7 K/uL — ABNORMAL LOW (ref 4.0–10.5)

## 2017-06-26 LAB — NM MYOCAR MULTI W/SPECT W/WALL MOTION / EF
CHL CUP MPHR: 146 {beats}/min
CSEPHR: 54 %
Peak HR: 80 {beats}/min
Rest HR: 69 {beats}/min

## 2017-06-26 LAB — HEPARIN LEVEL (UNFRACTIONATED): HEPARIN UNFRACTIONATED: 0.37 [IU]/mL (ref 0.30–0.70)

## 2017-06-26 MED ORDER — INSULIN ASPART 100 UNIT/ML ~~LOC~~ SOLN
3.0000 [IU] | Freq: Three times a day (TID) | SUBCUTANEOUS | Status: DC
  Administered 2017-06-26 (×2): 3 [IU] via SUBCUTANEOUS

## 2017-06-26 MED ORDER — TECHNETIUM TC 99M TETROFOSMIN IV KIT
10.0000 | PACK | Freq: Once | INTRAVENOUS | Status: AC | PRN
  Administered 2017-06-26: 10 via INTRAVENOUS

## 2017-06-26 MED ORDER — REGADENOSON 0.4 MG/5ML IV SOLN
INTRAVENOUS | Status: AC
  Filled 2017-06-26: qty 5

## 2017-06-26 MED ORDER — ASPIRIN 81 MG PO CHEW: 81.0000 mg | CHEWABLE_TABLET | Freq: Every day | ORAL | Status: DC

## 2017-06-26 MED ORDER — AMIODARONE HCL 200 MG PO TABS: 200.0000 mg | ORAL_TABLET | Freq: Every day | ORAL | 0 refills | Status: DC

## 2017-06-26 MED ORDER — TECHNETIUM TC 99M TETROFOSMIN IV KIT
30.0000 | PACK | Freq: Once | INTRAVENOUS | Status: AC | PRN
  Administered 2017-06-26: 30 via INTRAVENOUS

## 2017-06-26 MED ORDER — REGADENOSON 0.4 MG/5ML IV SOLN
0.4000 mg | Freq: Once | INTRAVENOUS | Status: AC
  Administered 2017-06-26: 0.4 mg via INTRAVENOUS
  Filled 2017-06-26: qty 5

## 2017-06-26 NOTE — Progress Notes (Signed)
[place] for heparin Indication: atrial fibrillation  Allergies  Allergen Reactions  . Percocet [Oxycodone-Acetaminophen] Nausea And Vomiting  . Metformin And Related Nausea Only    Upset stomach  . Invokana [Canagliflozin] Rash   Patient Measurements: Height: 5\' 11"  (180.3 cm) Weight: 249 lb 12.8 oz (113.3 kg) IBW/kg (Calculated) : 75.3 Heparin Dosing Weight: 100kg  Vital Signs: Temp: 97.4 F (36.3 C) (12/01 1141) Temp Source: Oral (12/01 1141) BP: 141/64 (12/01 1141) Pulse Rate: 74 (12/01 1141)  Labs: Recent Labs    06/24/17 2245 06/25/17 0141 06/25/17 0257 06/25/17 0835 06/25/17 0927 06/25/17 1239 06/25/17 1903 06/26/17 0427  HGB 12.3*  --  11.3*  --   --   --   --  10.7*  HCT 33.8*  --  33.9*  --   --   --   --  31.9*  PLT 164  --  156  --   --   --   --  118*  HEPARINUNFRC  --   --   --   --  <0.10*  --  0.39 0.37  CREATININE 1.83*  --   --  1.83*  --   --   --  2.06*  TROPONINI  --  0.07*  --  0.58*  --  0.69*  --   --    Estimated Creatinine Clearance: 40.3 mL/min (A) (by C-G formula based on SCr of 2.06 mg/dL (H)).  Assessment: 76yo male c/o chest tightness and SOB, found to be in Afib w/ RVR, known h/o PAF, had been on Eliquis but was d/c'd ~71yr ago for GIB in setting of requiring dual-antiplatelets after MI/stent w/ plan to resume Eliquis Jan 2019; Paged MD as patients aspirin was 81mg  PTA and has been reordered as 325mg  during this admit; now to begin heparin; Hgb down slightly from admit 123>10.7, PLT 164>118; No no current overt signs of active bleeding.  Heparin level therapeutic: 0.37  Goal of Therapy:  Heparin level 0.3-0.7 units/ml Monitor platelets by anticoagulation protocol: Yes   Plan:  Continue heparin to 1950 units/hr Daily heparin level and CBC Monitor for s/sx bleeding  Georga Bora, PharmD Clinical Pharmacist 06/26/2017 12:40 PM

## 2017-06-26 NOTE — Progress Notes (Addendum)
Progress Note  Patient Name: Michael [name] Date of Encounter: 06/26/2017  Primary Cardiologist: Dr. Johnsie Cancel  Subjective   Patient is feeling well; denies chest pain, SOB, and palpitations. Pt seen in nuc med.  Inpatient Medications    Scheduled Meds: . regadenoson      . allopurinol  200 mg Oral Daily  . amiodarone  200 mg Oral Daily  . aspirin  325 mg Oral Daily  . atorvastatin  40 mg Oral q1800  . clopidogrel  75 mg Oral Daily  . diltiazem  180 mg Oral Daily  . fenofibrate  160 mg Oral Daily  . ferrous sulfate  325 mg Oral TID WC  . furosemide  80 mg Oral BID  . gabapentin  100 mg Oral BID  . hydrALAZINE  25 mg Oral TID  . insulin aspart  0-15 Units Subcutaneous TID WC  . insulin aspart  0-5 Units Subcutaneous QHS  . insulin aspart  3 Units Subcutaneous TID WC  . insulin glargine  52 Units Subcutaneous QAC breakfast  . levothyroxine  150 mcg Oral QAC breakfast  . metoprolol tartrate  75 mg Oral BID  . pantoprazole  40 mg Oral Daily   Continuous Infusions: . diltiazem (CARDIZEM) infusion Stopped (06/25/17 0653)  . heparin 1,950 Units/hr (06/26/17 0327)   PRN Meds: acetaminophen **OR** acetaminophen, HYDROcodone-acetaminophen, nitroGLYCERIN, ondansetron **OR** ondansetron (ZOFRAN) IV   Vital Signs    Vitals:   06/26/17 0909 06/26/17 0931 06/26/17 0932 06/26/17 0934  BP: (!) 186/78 (!) 164/73 (!) 156/66 (!) 157/63  Pulse:      Resp:      Temp:      TempSrc:      SpO2:      Weight:      Height:        Intake/Output Summary (Last 24 hours) at 06/26/2017 1003 Last data filed at 06/26/2017 2119 Gross per 24 hour  Intake 1242.63 ml  Output 0 ml  Net 1242.63 ml   Filed Weights   06/24/17 2215 06/25/17 1242 06/26/17 0544  Weight: 255 lb (115.7 kg) 252 lb 4.8 oz (114.4 kg) 249 lb 12.8 oz (113.3 kg)     Physical Exam   General: Well developed, well nourished, male appearing in no acute distress. Head: Normocephalic, atraumatic.  Neck: Supple without  bruits, no JVD. Lungs:  Resp regular and unlabored, CTA. Heart: RRR, S1, S2, 3/6 systolic murmur; no rub. Abdomen: Soft, non-tender, non-distended with normoactive bowel sounds. No hepatomegaly. No rebound/guarding. No obvious abdominal masses. Extremities: No clubbing, cyanosis, no edema. Distal pedal pulses are 2+ bilaterally. Neuro: Alert and oriented X 3. Moves all extremities spontaneously. Psych: Normal affect.  Labs    Chemistry Recent Labs  Lab 06/24/17 2245 06/25/17 0835 06/26/17 0427  NA 131* 131* 135  K 5.3* 4.5 4.4  CL 100* 100* 100*  CO2 21* 22 26  GLUCOSE 355* 398* 299*  BUN 36* 37* 37*  CREATININE 1.83* 1.83* 2.06*  CALCIUM 8.7* 8.3* 8.6*  GFRNONAA 35* 35* 30*  GFRAA 40* 40* 35*  ANIONGAP 10 9 9      Hematology Recent Labs  Lab 06/24/17 2245 06/25/17 0257 06/26/17 0427  WBC 5.0 4.6 3.7*  RBC 3.94* 3.76* 3.65*  HGB 12.3* 11.3* 10.7*  HCT 33.8* 33.9* 31.9*  MCV 85.8 90.2 87.4  MCH 31.2 30.1 29.3  MCHC 36.4* 33.3 33.5  RDW 16.0* 15.7* 16.1*  PLT 164 156 118*    Cardiac Enzymes Recent Labs  Lab 06/25/17 0141  06/25/17 0835 06/25/17 1239  TROPONINI 0.07* 0.58* 0.69*    Recent Labs  Lab 06/24/17 2252  TROPIPOC 0.02     BNP Recent Labs  Lab 06/24/17 2326  BNP 180.4*     DDimer No results for input(s): DDIMER in the last 168 hours.   Radiology    Dg Chest 2 View  Result Date: 06/24/2017 CLINICAL DATA:  Chest pain. EXAM: CHEST  2 VIEW COMPARISON:  Most recent radiograph 02/05/2017 FINDINGS: Cardiomegaly, similar to decreased from prior year. Unchanged mediastinal contours with aortic atherosclerosis. Persistent left pleural effusion, but decreased from prior exam. Mild central pulmonary edema. No new airspace disease or pneumothorax. No acute osseous abnormality. IMPRESSION: Cardiomegaly with mild pulmonary edema and small left pleural effusion, improved from most recent comparison. Electronically Signed   By: Jeb Levering M.D.   On:  06/24/2017 23:10     Telemetry    sinus - Personally Reviewed  ECG    No new tracings - Personally Reviewed   Cardiac Studies   NM Myoview: pending  Patient Profile     76 y.o. male with a hx of PAF (not on anticoagulation therapy secondary to hx of GI bleeding- SB AVMs), CAD s/p DES to OM1 80%>0% (09/2016) on dual antiplatelet therapy (ASA. Plavix), diffuse-mild/moderate nonobstructive CAD in the LAD, CIRC, and RCA, diastolic heart failure, chronic kidney diease stage III, and anemia who is being seen for the evaluation of atrial fibrillation with RVR   Assessment & Plan   See consult note 06/25/17.    1. Atrial fibrillation with RVR - NSR now - continue PO dilt and lopressor - continue ASA and plavix (DES in 07/2016) - continue amiodarone 200 mg daily - unclear why this was stopped  2. CAD - myoview completed today for chest pressure and recent stent placement - pending final read - continue lipitor  3. Chronic diastolic heart failure - continue PO lasix   Signed, Ledora Bottcher , PA-C 10:03 AM 06/26/2017 Pager: [phone]  Cardiology Attending  Patient seen and examined. Agree with above. If his stress test looks good (not high risk) then he can be discharged home. Continue amiodarone 200 mg daily. Low sodium diet. Hopefully his stress test will be ready in an hour or two.he is clinically very stable.  Gregg Taylor,M.D.   Addedum: I have reviewed his stress test, echo and old stress test. This is not a high risk stress test. I would allow him to be discharged home. He can follow up as an outpatient with Dr. Johnsie Cancel.  Mikle Bosworth.D.

## 2017-06-26 NOTE — Discharge Instructions (Signed)

## 2017-06-26 NOTE — Discharge Summary (Signed)
Physician Discharge Summary  Michael [name] FXJ:[id number] DOB: 17-Apr-1941  PCP: Michael Anger, MD  Admit date: 06/24/2017 Discharge date: 06/26/2017  Recommendations for Outpatient Follow-up:  1. Dr. [name], PCP in 5 days with repeat labs (CBC & BMP). 2. Dr. Jenkins Rouge, Cardiology in 2 weeks.  Home Health: None Equipment/Devices: None    Discharge Condition: Improved and stable  CODE STATUS: Full  Diet recommendation: Heart healthy & diabetic diet.  Discharge Diagnoses:  Principal Problem:   Atrial fibrillation with rapid ventricular response (HCC) Active Problems:   DM (diabetes mellitus), type 2 with peripheral vascular complications (HCC)   Essential hypertension   Postherpetic neuralgia   Subclavian artery stenosis, right (HCC)   Hypothyroidism   Chronic diastolic CHF (congestive heart failure) (HCC)   PAD (peripheral artery disease) (HCC)   CKD (chronic kidney disease), stage III (HCC)   Atrial fibrillation with RVR [name])   Brief Summary: 76 year old married male with a PMH of PAF, CAD/NSTEMI status post stenting to OM1 March 6415, chronic diastolic CHF, GI bleed due to small bowel AVMs (Dr. Carlean [name]), stage III chronic kidney disease, iron deficiency anemia, GERD, HTN, HLD, hypothyroid, OSA intolerant of CPAP, type II DM/IDDM, psoriasis and recently resumed Humira, resented to the ED due to palpitations that started on night of admission. Noted in A. fib with RVR, briefly on IV Cardizem drip, reverted to sinus rhythm. Cardiology consulted.   Assessment & Plan:   1. Paroxysmal A. fib with RVR: Symptomatic. Briefly on IV Cardizem drip in ED. Reverted to sinus rhythm. Cardizem drip discontinued. Continued on home dose of Cardizem 180 mg daily and metoprolol 75 MG twice a day. Patient reports that he was taken off amiodarone in June due to bradycardia issues and off of Eliquis since January 2018 due to GI bleed. Currently remains on home dose of aspirin and  Plavix. IV heparin initiated in ED. As per cardiology office visit 11/27, some thought regarding discontinuing Plavix and resuming anticoagulation. Cardiology consulted for assistance. As per cardiology, not a good candidate for anticoagulation due to small bowel AVM related bleeding in the past. He needs to be on dual antiplatelet therapy through January 2019 due to DES for NSTEMI January 2018. Amiodarone was restarted and recommend discontinuing diltiazem after a few days of amiodarone. Outpatient follow-up with cardiology regarding tapering or discontinuing diltiazem. 2. Elevated troponin/CAD status post stent March 2018: No chest pain reported. Troponin 0.07 > 0.58 >0.69. Likely related to demand ischemia from A. fib with RVR. Patient had some fatigue and nonspecific symptoms and cardiology wanted to rule out Cardiac ischemia. Underwent Lexiscan Myoview which was reported as intermediate risk. I discussed results with Dr. Lovena [name], who indicated that this was not a high risk stress test and cleared him for discharge home. 3. Acute on Stage III chronic kidney disease: Baseline creatinine probably in the 1.7-1.8 range. Presented with creatinine of 1.83. Creatinine bumped up to 2.06. Likely related to higher than home dose of Lasix which he states that he takes 40 mg twice a day confirmed by his wife. Held Lasix today. Resume Lasix 40 mg twice a day at discharge. Follow BMP in a few days as outpatient. 4. Mild hyperkalemia: Resolved. Follow BMP closely in a few days as outpatient. 5. Uncontrolled type II DM/IDDM with renal complications: A3E 6 in September 2018. Reports fasting blood sugars at home ranging between 140-180 mg per DL. Presented with blood glucose 444 but no obvious DKA. Continue home Lantus 52 units in a.m.,  added NovoLog SSI, moderate scale and NovoLog mealtime 3 units 3 times a day while hospitalized. At discharge, left him on his home dose of Lantus and oral hypoglycemics. Recommend close  follow-up with PCP to determine further adjustment to his Lantus +/- initiating short-acting insulins. It appears that patient may not be fully compliant with his diet, consult regarding same. 6. Hyperlipidemia: Continue atorvastatin. 7. Right subclavian stenosis/bilateral carotid stenosis: Stable and outpatient follow-up. 8. Chronic diastolic CHF: Volume status appears okay. Patient does report approximately 8 pound weight gain since June. Lasix briefly held due to worsening creatinine. Resume home dose of Lasix at discharge. 9. Iron deficiency anemia: Hemoglobin stable compared to yesterday. However platelet count has dropped from 156-118. Was on IV heparin drip, discontinued at discharge. Follow CBC in a few days. No bleeding reported. 10. History of GI bleed due to small bowel AVMs: Denies recent history of GI bleed symptoms. 11. Essential hypertension: Mildly uncontrolled. Continue hydralazine, diltiazem and metoprolol. 12. GERD: Protonix. 13. Hypothyroid: TSH normal. Continue Synthroid. 14. OSA: Intolerant and hence has not used CPAP. 15. Psoriasis: Spouse reports recent reinitiation of Humira. 16. Obesity/Body mass index is 35.19 kg/m.    Consultants:  Cardiology-pending   Procedures:  None      Discharge Instructions  Discharge Instructions    (HEART FAILURE PATIENTS) Call MD:  Anytime you have any of the following symptoms: 1) 3 pound weight gain in 24 hours or 5 pounds in 1 week 2) shortness of breath, with or without a dry hacking cough 3) swelling in the hands, feet or stomach 4) if you have to sleep on extra pillows at night in order to breathe.   Complete by:  As directed    Call MD for:   Complete by:  As directed    Heart racing or palpitations.   Call MD for:  difficulty breathing, headache or visual disturbances   Complete by:  As directed    Call MD for:  extreme fatigue   Complete by:  As directed    Call MD for:  persistant dizziness or light-headedness    Complete by:  As directed    Call MD for:  severe uncontrolled pain   Complete by:  As directed    Diet - low sodium heart healthy   Complete by:  As directed    Diet Carb Modified   Complete by:  As directed    Increase activity slowly   Complete by:  As directed        Medication List    TAKE these medications   allopurinol 100 MG tablet Commonly known as:  ZYLOPRIM Take 2 tablets (200 mg total) by mouth daily.   amiodarone 200 MG tablet Commonly known as:  PACERONE Take 1 tablet (200 mg total) by mouth daily. Start taking on:  06/27/2017   aspirin 81 MG chewable tablet Chew 1 tablet (81 mg total) by mouth daily.   atorvastatin 40 MG tablet Commonly known as:  LIPITOR Take 1 tablet (40 mg total) by mouth daily.   b complex vitamins tablet Take 1 tablet by mouth daily.   Blood Pressure Monitor Kit Use to check blood pressure daily Dx I10   clopidogrel 75 MG tablet Commonly known as:  PLAVIX Take 1 tablet (75 mg total) by mouth daily with breakfast.   diltiazem 180 MG 24 hr capsule Commonly known as:  CARTIA XT Take 1 capsule (180 mg total) by mouth daily.   fenofibrate 160 MG tablet TAKE  1 TABLET(160 MG) BY MOUTH DAILY What changed:    how much to take  how to take this  when to take this  additional instructions   ferrous sulfate 325 (65 FE) MG tablet Take 325 mg by mouth 3 (three) times daily with meals.   furosemide 40 MG tablet Commonly known as:  LASIX Take 1 tablet (40 mg total) by mouth 2 (two) times daily. What changed:  how much to take   gabapentin 100 MG capsule Commonly known as:  NEURONTIN Take 1 capsule (100 mg total) by mouth 2 (two) times daily.   glipiZIDE 10 MG tablet Commonly known as:  GLUCOTROL Take 1 tablet (10 mg total) by mouth 2 (two) times daily before a meal.   glucose blood test strip Commonly known as:  ONE TOUCH ULTRA TEST 1 each by Other route 4 (four) times daily. Use to check blood sugar four times a day   Dx: E11.9- insulin Dependant   hydrALAZINE 25 MG tablet Commonly known as:  APRESOLINE Take 1 tablet (25 mg total) by mouth 3 (three) times daily.   HYDROcodone-acetaminophen 5-325 MG tablet Commonly known as:  NORCO/VICODIN Take 1 tablet by mouth 2 (two) times daily as needed for moderate pain.   Insulin Glargine 300 UNIT/ML Sopn Commonly known as:  TOUJEO SOLOSTAR Inject 52 Units into the skin daily. What changed:  when to take this   Insulin Pen Needle 31G X 5 MM Misc 1 Units by Does not apply route 4 (four) times daily.   levothyroxine 150 MCG tablet Commonly known as:  SYNTHROID, LEVOTHROID take 1 tablet by mouth daily BEFORE BREAKFAST What changed:    how much to take  how to take this  when to take this  additional instructions   metoprolol tartrate 25 MG tablet Commonly known as:  LOPRESSOR Take 3 tablets (75 mg total) by mouth 2 (two) times daily.   nitroGLYCERIN 0.4 MG SL tablet Commonly known as:  NITROSTAT Place 1 tablet (0.4 mg total) under the tongue every 5 (five) minutes as needed for chest pain.   omeprazole 40 MG capsule Commonly known as:  PRILOSEC Take 1 capsule (40 mg total) by mouth daily.   ONE TOUCH ULTRA 2 w/Device Kit Use as directed Dx E11.9   onetouch ultrasoft lancets Use to check blood sugars daily   potassium chloride SA 20 MEQ tablet Commonly known as:  K-DUR,KLOR-CON Take 1 tablet (20 mEq total) by mouth daily.   Vitamin D-3 1000 units Caps Take 1,000-5,000 Units by mouth See admin instructions. 1,000 units once a day on Sun/Mon/Tues/Wed/Thurs/Fri and 5,000 units on Sat      Follow-up Information    [name], Evie Lacks, MD Follow up in 5 day(s).   Specialty:  Internal Medicine Why:  To be seen with repeat labs (CBC & BMP). Contact information: [place] [address] [phone]        Josue Hector, MD. Schedule an appointment as soon as possible for a visit in 2 week(s).   Specialty:   Cardiology Contact information: [address] [place] [address] [phone]          Allergies  Allergen Reactions  . Percocet [Oxycodone-Acetaminophen] Nausea And Vomiting  . Metformin And Related Nausea Only    Upset stomach  . Invokana [Canagliflozin] Rash      Procedures/Studies: Dg Chest 2 View  Result Date: 06/24/2017 CLINICAL DATA:  Chest pain. EXAM: CHEST  2 VIEW COMPARISON:  Most recent radiograph 02/05/2017 FINDINGS: Cardiomegaly, similar to decreased from prior year. Unchanged mediastinal contours with aortic atherosclerosis. Persistent left pleural effusion, but decreased from prior exam. Mild central pulmonary edema. No new airspace disease or pneumothorax. No acute osseous abnormality. IMPRESSION: Cardiomegaly with mild pulmonary edema and small left pleural effusion, improved from most recent comparison. Electronically Signed   By: Jeb Levering M.D.   On: 06/24/2017 23:10   Nm Myocar Multi W/spect W/wall Motion / Ef  Result Date: 06/26/2017 CLINICAL DATA:  Chest pain. Dyspnea on exertion . Previous myocardial infarct. Smoker. Diabetes and hypertension. Hyperlipidemia. EXAM: MYOCARDIAL IMAGING WITH SPECT (REST AND PHARMACOLOGIC-STRESS) GATED LEFT VENTRICULAR WALL MOTION STUDY LEFT VENTRICULAR EJECTION FRACTION TECHNIQUE: Standard myocardial SPECT imaging was performed after resting intravenous injection of 10 mCi Tc-26mtetrofosmin. Subsequently, intravenous infusion of Lexiscan was performed under the supervision of the Cardiology staff. At peak effect of the drug, 30 mCi Tc-981metrofosmin was injected intravenously and standard myocardial SPECT imaging was performed. Quantitative gated imaging was also performed to evaluate left ventricular wall motion, and estimate left ventricular ejection fraction. COMPARISON:  10/13/2014 FINDINGS: Perfusion: A large area of decreased myocardial activity is seen on stress imaging involving the lateral and  inferolateral walls. On rest imaging, there is partial reversibility of this defect in the distal lateral inferolateral wall. These findings are consistent with myocardial infarct with peri-infarct ischemia. Wall Motion: Mild global left ventricular hypokinesis. Left Ventricular Ejection Fraction: 46 % End diastolic volume 15417l End systolic volume 85 ml IMPRESSION: 1. Large myocardial infarction involving the lateral and inferolateral walls, with peri-infarct ischemia distally. 2. Mild global left ventricular hypokinesis. 3. Left ventricular ejection fraction 46% 4. Non invasive risk stratification*: Intermediate *2012 Appropriate Use Criteria for Coronary Revascularization Focused Update: J Am Coll Cardiol. 204081;44(8):[email or web address]?[email or web address] Electronically Signed   By: JoEarle Gell.D.   On: 06/26/2017 13:26      Subjective: Feels better. No palpitations, chest pain or dyspnea reported. Anxious to go home.  Discharge Exam:  Vitals:   06/26/17 0931 06/26/17 0932 06/26/17 0934 06/26/17 1141  BP: (!) 164/73 (!) 156/66 (!) 157/63 (!) 141/64  Pulse:    74  Resp:    20  Temp:    (!) 97.4 F (36.3 C)  TempSrc:    Oral  SpO2:    97%  Weight:      Height:        General exam: Elderly male, moderately built and obese, sitting up comfortably in bed. Respiratory system: Clear to auscultation. Respiratory effort normal. Cardiovascular system: S1 & S2 heard, RRR. No JVD, murmurs, rubs, gallops or clicks. Trace bilateral ankle edema. Telemetry: Sinus rhythm with BBB morphology. Gastrointestinal system: Abdomen is nondistended, soft and nontender. No organomegaly or masses felt. Normal bowel sounds heard. Central nervous system: Alert and oriented. No focal neurological deficits. Extremities: Symmetric 5 x 5 power. Skin: Healed scars over both legs. Psychiatry: Judgement and insight appear normal. Mood & affect appropriate.       The results of  significant diagnostics from this hospitalization (including imaging, microbiology, ancillary and laboratory) are listed below for reference.     Microbiology: Recent Results (from the past 240 hour(s))  MRSA PCR Screening     Status: None   Collection Time: 06/25/17  6:54 PM  Result Value Ref Range Status   MRSA by PCR NEGATIVE NEGATIVE Final    Comment:        The GeneXpert MRSA Assay (FDA approved for NASAL specimens  only), is one component of a comprehensive MRSA colonization surveillance program. It is not intended to diagnose MRSA infection nor to guide or monitor treatment for MRSA infections.      Labs: CBC: Recent Labs  Lab 06/24/17 2245 06/25/17 0257 06/26/17 0427  WBC 5.0 4.6 3.7*  NEUTROABS 3.8  --   --   HGB 12.3* 11.3* 10.7*  HCT 33.8* 33.9* 31.9*  MCV 85.8 90.2 87.4  PLT 164 156 353*   Basic Metabolic Panel: Recent Labs  Lab 06/21/17 0918 06/24/17 2245 06/25/17 0835 06/26/17 0427  NA 132* 131* 131* 135  K 4.8 5.3* 4.5 4.4  CL 96 100* 100* 100*  CO2 20 21* 22 26  GLUCOSE 444* 355* 398* 299*  BUN 37* 36* 37* 37*  CREATININE 1.75* 1.83* 1.83* 2.06*  CALCIUM 9.0 8.7* 8.3* 8.6*   BNP (last 3 results) Recent Labs    02/05/17 1611 02/07/17 0449 06/24/17 2326  BNP 527.3* 351.5* 180.4*   Cardiac Enzymes: Recent Labs  Lab 06/25/17 0141 06/25/17 0835 06/25/17 1239  TROPONINI 0.07* 0.58* 0.69*   CBG: Recent Labs  Lab 06/25/17 1235 06/25/17 1609 06/26/17 0737 06/26/17 1123 06/26/17 1634  GLUCAP 315* 234* 295* 337* 240*   Thyroid function studies Recent Labs    06/25/17 0141  TSH 3.293   Discussed in detail with patient's spouse at bedside. Updated care and answered questions.    Time coordinating discharge: Over 30 minutes  SIGNED:  Vernell Leep, MD, FACP, [facility]. [facility] Pager [phone]  If 7PM-7AM, please contact night-coverage [email or web address] Password [email or web address] 06/26/2017, 5:05 PM

## 2017-06-26 NOTE — Plan of Care (Signed)
Discharge instructions reviewed with patient and wife, questions answered, verbalized understanding.

## 2017-06-26 NOTE — Progress Notes (Signed)
   Michael [name] presented for a nuclear stress test today.  No immediate complications.  Stress imaging is pending at this time.  Preliminary EKG findings may be listed in the chart, but the stress test result will not be finalized until perfusion imaging is complete.  Tami Lin Duke, PA-C 06/26/2017, 11:25 AM

## 2017-06-28 ENCOUNTER — Telehealth: Payer: Self-pay | Admitting: *Deleted

## 2017-06-28 LAB — GLUCOSE, CAPILLARY: GLUCOSE-CAPILLARY: 295 mg/dL — AB (ref 65–99)

## 2017-06-28 NOTE — Telephone Encounter (Signed)
Transition Care Management Follow-up Telephone Call   Date discharged? 06/26/17   How have you been since you were released from the hospital? Pt states he is doing alright   Do you understand why you were in the hospital? YES   Do you understand the discharge instructions? YES   Where were you discharged to? Home   Items Reviewed:  Medications reviewed: YES  Allergies reviewed: YES  Dietary changes reviewed: YES, heart healthy and diabetic diet  Referrals reviewed: YES, pt states he have not made appt w/Dr. Johnsie Cancel because he just saw him week or so ago   Functional Questionnaire:   Activities of Daily Living (ADLs):   He states he are independent in the following: bathing and hygiene, feeding, continence, grooming, toileting and dressing States he require assistance with the following: ambulation   Any transportation issues/concerns?: NO   Any patient concerns? NO   Confirmed importance and date/time of follow-up visits scheduled YES, appt 07/05/17. Pt had a f/u appt schedule for this day already he did not want to move up, and also on the same day he is having his medicare wellness exam, and the date works great w/his schedule. Change status to hosp f/u  Provider Appointment booked with Dr. Alain Marion   Confirmed with patient if condition begins to worsen call PCP or go to the ER.  Patient was given the office number and encouraged to call back with question or concerns.  : YES

## 2017-07-05 ENCOUNTER — Inpatient Hospital Stay: Payer: Medicare Other | Admitting: Internal Medicine

## 2017-07-29 ENCOUNTER — Encounter: Payer: Self-pay | Admitting: Internal Medicine

## 2017-07-29 ENCOUNTER — Ambulatory Visit (INDEPENDENT_AMBULATORY_CARE_PROVIDER_SITE_OTHER): Payer: Medicare Other | Admitting: Internal Medicine

## 2017-07-29 DIAGNOSIS — L4050 Arthropathic psoriasis, unspecified: Secondary | ICD-10-CM | POA: Diagnosis not present

## 2017-07-29 DIAGNOSIS — N183 Chronic kidney disease, stage 3 unspecified: Secondary | ICD-10-CM

## 2017-07-29 DIAGNOSIS — E1151 Type 2 diabetes mellitus with diabetic peripheral angiopathy without gangrene: Secondary | ICD-10-CM

## 2017-07-29 DIAGNOSIS — I481 Persistent atrial fibrillation: Secondary | ICD-10-CM

## 2017-07-29 DIAGNOSIS — I10 Essential (primary) hypertension: Secondary | ICD-10-CM | POA: Diagnosis not present

## 2017-07-29 DIAGNOSIS — E039 Hypothyroidism, unspecified: Secondary | ICD-10-CM

## 2017-07-29 DIAGNOSIS — I4819 Other persistent atrial fibrillation: Secondary | ICD-10-CM

## 2017-07-29 MED ORDER — HYDROCODONE-ACETAMINOPHEN 5-325 MG PO TABS: 1.0000 | ORAL_TABLET | Freq: Two times a day (BID) | ORAL | 0 refills | Status: DC | PRN

## 2017-07-29 NOTE — Assessment & Plan Note (Signed)
[place] 

## 2017-07-29 NOTE — Assessment & Plan Note (Signed)
Labs

## 2017-07-29 NOTE — Progress Notes (Signed)
Subjective:  Patient ID: Michael [name], male    DOB: 1941-05-02  Age: 77 y.o. MRN: [id number]  CC: No chief complaint on file.   HPI Michael [name] presents for psoriatic arthritis, a fib w/RVR, HTN, DM, CAD f/u  Outpatient Medications Prior to Visit  Medication Sig Dispense Refill  . allopurinol (ZYLOPRIM) 100 MG tablet Take 2 tablets (200 mg total) by mouth daily. 180 tablet 1  . amiodarone (PACERONE) 200 MG tablet Take 1 tablet (200 mg total) by mouth daily. 30 tablet 0  . aspirin 81 MG chewable tablet Chew 1 tablet (81 mg total) by mouth daily. 90 tablet 1  . atorvastatin (LIPITOR) 40 MG tablet Take 1 tablet (40 mg total) by mouth daily. 90 tablet 1  . b complex vitamins tablet Take 1 tablet by mouth daily. 100 tablet 3  . Blood Glucose Monitoring Suppl (ONE TOUCH ULTRA 2) W/DEVICE KIT Use as directed Dx E11.9 1 each 0  . Blood Pressure Monitor KIT Use to check blood pressure daily Dx I10 1 each 0  . Cholecalciferol (VITAMIN D-3) 1000 units CAPS Take 1,000-5,000 Units by mouth See admin instructions. 1,000 units once a day on Sun/Mon/Tues/Wed/Thurs/Fri and 5,000 units on Sat    . clopidogrel (PLAVIX) 75 MG tablet Take 1 tablet (75 mg total) by mouth daily with breakfast. 90 tablet 1  . diltiazem (CARTIA XT) 180 MG 24 hr capsule Take 1 capsule (180 mg total) by mouth daily. 90 capsule 0  . fenofibrate 160 MG tablet TAKE 1 TABLET(160 MG) BY MOUTH DAILY (Patient taking differently: Take 160 mg by mouth daily. ) 90 tablet 3  . ferrous sulfate 325 (65 FE) MG tablet Take 325 mg by mouth 3 (three) times daily with meals.    . furosemide (LASIX) 40 MG tablet Take 1 tablet (40 mg total) by mouth 2 (two) times daily. (Patient taking differently: Take 80 mg by mouth 2 (two) times daily. ) 180 tablet 3  . gabapentin (NEURONTIN) 100 MG capsule Take 1 capsule (100 mg total) by mouth 2 (two) times daily. 180 capsule 1  . glipiZIDE (GLUCOTROL) 10 MG tablet Take 1 tablet (10 mg total) by mouth 2  (two) times daily before a meal. 180 tablet 1  . glucose blood (ONE TOUCH ULTRA TEST) test strip 1 each by Other route 4 (four) times daily. Use to check blood sugar four times a day  Dx: E11.9- insulin Dependant 360 each 1  . hydrALAZINE (APRESOLINE) 25 MG tablet Take 1 tablet (25 mg total) by mouth 3 (three) times daily. 270 tablet 1  . HYDROcodone-acetaminophen (NORCO/VICODIN) 5-325 MG tablet Take 1 tablet by mouth 2 (two) times daily as needed for moderate pain. 60 tablet 0  . Insulin Glargine (TOUJEO SOLOSTAR) 300 UNIT/ML SOPN Inject 52 Units into the skin daily. (Patient taking differently: Inject 52 Units into the skin daily before breakfast. ) 9 mL 5  . Insulin Pen Needle 31G X 5 MM MISC 1 Units by Does not apply route 4 (four) times daily. 150 each 11  . Lancets (ONETOUCH ULTRASOFT) lancets Use to check blood sugars daily 30 each 11  . levothyroxine (SYNTHROID, LEVOTHROID) 150 MCG tablet take 1 tablet by mouth daily BEFORE BREAKFAST (Patient taking differently: Take 150 mcg by mouth daily before breakfast. ) 90 tablet 1  . metoprolol tartrate (LOPRESSOR) 25 MG tablet Take 3 tablets (75 mg total) by mouth 2 (two) times daily. 540 tablet 1  . nitroGLYCERIN (NITROSTAT) 0.4 MG  SL tablet Place 1 tablet (0.4 mg total) under the tongue every 5 (five) minutes as needed for chest pain. 100 tablet 0  . omeprazole (PRILOSEC) 40 MG capsule Take 1 capsule (40 mg total) by mouth daily. 90 capsule 1  . potassium chloride SA (K-DUR,KLOR-CON) 20 MEQ tablet Take 1 tablet (20 mEq total) by mouth daily. 90 tablet 1   No facility-administered medications prior to visit.     ROS Review of Systems  Constitutional: Positive for fatigue. Negative for appetite change and unexpected weight change.  HENT: Negative for congestion, nosebleeds, sneezing, sore throat and trouble swallowing.   Eyes: Negative for itching and visual disturbance.  Respiratory: Negative for cough.   Cardiovascular: Negative for chest  pain, palpitations and leg swelling.  Gastrointestinal: Negative for abdominal distention, blood in stool, diarrhea and nausea.  Genitourinary: Negative for frequency and hematuria.  Musculoskeletal: Positive for arthralgias. Negative for back pain, gait problem, joint swelling and neck pain.  Skin: Negative for rash.  Neurological: Negative for dizziness, tremors, speech difficulty and weakness.  Psychiatric/Behavioral: Negative for agitation, dysphoric mood and sleep disturbance. The patient is not nervous/anxious.     Objective:  BP (!) 146/54 (BP Location: Left Arm, Patient Position: Sitting, Cuff Size: Large)   Pulse (!) 57   Temp 98.2 F (36.8 C) (Oral)   Ht _0  (1.803 m)   Wt 256 lb (116.1 kg)   SpO2 99%   BMI 35.70 kg/m   BP Readings from Last 3 Encounters:  07/29/17 (!) 146/54  06/26/17 (!) 141/64  06/22/17 (!) 148/60    Wt Readings from Last 3 Encounters:  07/29/17 256 lb (116.1 kg)  06/26/17 249 lb 12.8 oz (113.3 kg)  06/22/17 255 lb 6.4 oz (115.8 kg)    Physical Exam  Constitutional: He is oriented to person, place, and time. He appears well-developed. No distress.  NAD  HENT:  Mouth/Throat: Oropharynx is clear and moist.  Eyes: Conjunctivae are normal. Pupils are equal, round, and reactive to light.  Neck: Normal range of motion. No JVD present. No thyromegaly present.  Cardiovascular: Normal rate, regular rhythm, normal heart sounds and intact distal pulses. Exam reveals no gallop and no friction rub.  No murmur heard. Pulmonary/Chest: Effort normal and breath sounds normal. No respiratory distress. He has no wheezes. He has no rales. He exhibits no tenderness.  Abdominal: Soft. Bowel sounds are normal. He exhibits no distension and no mass. There is no tenderness. There is no rebound and no guarding.  Musculoskeletal: Normal range of motion. He exhibits tenderness. He exhibits no edema.  Lymphadenopathy:    He has no cervical adenopathy.  Neurological:  He is alert and oriented to person, place, and time. He has normal reflexes. No cranial nerve deficit. He exhibits normal muscle tone. He displays a negative Romberg sign. Coordination and gait normal.  Skin: Skin is warm and dry. Rash noted.  Psychiatric: He has a normal mood and affect. His behavior is normal. Judgment and thought content normal.  dry skin  Hands less painful  Lab Results  Component Value Date   WBC 3.7 (L) 06/26/2017   HGB 10.7 (L) 06/26/2017   HCT 31.9 (L) 06/26/2017   PLT 118 (L) 06/26/2017   GLUCOSE 299 (H) 06/26/2017   CHOL 101 08/01/2016   TRIG 344 (H) 08/01/2016   HDL 22 (L) 08/01/2016   LDLDIRECT 26.0 03/20/2015   LDLCALC 10 08/01/2016   ALT 9 (L) 08/10/2016   AST 23 08/10/2016   NA  135 06/26/2017   K 4.4 06/26/2017   CL 100 (L) 06/26/2017   CREATININE 2.06 (H) 06/26/2017   BUN 37 (H) 06/26/2017   CO2 26 06/26/2017   TSH 3.293 06/25/2017   PSA 0.50 11/07/2013   INR 1.06 12/08/2016   HGBA1C 6.0 04/14/2017   MICROALBUR 1.2 05/16/2013    Dg Chest 2 View  Result Date: 06/24/2017 CLINICAL DATA:  Chest pain. EXAM: CHEST  2 VIEW COMPARISON:  Most recent radiograph 02/05/2017 FINDINGS: Cardiomegaly, similar to decreased from prior year. Unchanged mediastinal contours with aortic atherosclerosis. Persistent left pleural effusion, but decreased from prior exam. Mild central pulmonary edema. No new airspace disease or pneumothorax. No acute osseous abnormality. IMPRESSION: Cardiomegaly with mild pulmonary edema and small left pleural effusion, improved from most recent comparison. Electronically Signed   By: Jeb Levering M.D.   On: 06/24/2017 23:10    Assessment & Plan:   There are no diagnoses linked to this encounter. I am having Michael [name] maintain his ferrous sulfate, ONE TOUCH ULTRA 2, onetouch ultrasoft, Blood Pressure Monitor, Insulin Pen Needle, HYDROcodone-acetaminophen, b complex vitamins, allopurinol, aspirin, atorvastatin, gabapentin,  glipiZIDE, hydrALAZINE, levothyroxine, omeprazole, potassium chloride SA, clopidogrel, metoprolol tartrate, diltiazem, Insulin Glargine, glucose blood, fenofibrate, furosemide, nitroGLYCERIN, Vitamin D-3, and amiodarone.  No orders of the defined types were placed in this encounter.    Follow-up: No Follow-up on file.  [name], MD

## 2017-07-29 NOTE — Assessment & Plan Note (Signed)
On amiodarone, cardizem On ASA and Plavix 

## 2017-07-29 NOTE — Assessment & Plan Note (Signed)
On Humira 

## 2017-07-29 NOTE — Assessment & Plan Note (Signed)
On Cardizem, toprol, furosemide

## 2017-08-09 ENCOUNTER — Other Ambulatory Visit: Payer: Self-pay | Admitting: Cardiovascular Disease

## 2017-08-09 MED ORDER — DILTIAZEM HCL ER COATED BEADS 180 MG PO CP24: 180.0000 mg | ORAL_CAPSULE | Freq: Every day | ORAL | 3 refills | Status: DC

## 2017-08-12 ENCOUNTER — Other Ambulatory Visit: Payer: Self-pay | Admitting: *Deleted

## 2017-08-12 MED ORDER — AMIODARONE HCL 200 MG PO TABS: 200.0000 mg | ORAL_TABLET | Freq: Every day | ORAL | 0 refills | Status: DC

## 2017-08-17 ENCOUNTER — Telehealth: Payer: Self-pay | Admitting: Internal Medicine

## 2017-08-17 NOTE — Telephone Encounter (Unsigned)
Copied from [place] [phone]. Topic: Quick Communication - See Telephone Encounter >> Aug 17, 2017  1:55 PM Hewitt Shorts wrote: CRM for notification. See Telephone encounter for:  08/17/17.

## 2017-08-18 ENCOUNTER — Telehealth: Payer: Self-pay | Admitting: Cardiovascular Disease

## 2017-08-18 NOTE — Telephone Encounter (Signed)
Left message for pharmacy to call back. 

## 2017-08-18 NOTE — Telephone Encounter (Signed)
New message     Pharmacy calling to inform, possible drug interaction between amiodarone (PACERONE) 200 MG tablet and diltiazem (CARTIA XT) 180 MG 24 hr capsule. Please call to confirm you are aware or change medication. Please call [phone], any team member can assist.   Pt c/o medication issue:  1. Name of Medication: amiodarone (PACERONE) 200 MG tablet and diltiazem (CARTIA XT) 180 MG 24 hr capsule.  2. How are you currently taking this medication (dosage and times per day)? As prescribed  3. Are you having a reaction (difficulty breathing--STAT)? n/a  4. What is your medication issue? Possible drug interaction

## 2017-08-23 NOTE — Telephone Encounter (Signed)
Patient has been on both medications for several years. On 02/05/17 amiodarone was stopped and 06/25/17 amiodarone was started back, see notes below. Patient is to continue both medications per discharge on 06/26/17. Patient has f/u appt with Dr. Johnsie Cancel in February. Will send to Dr. Johnsie Cancel so he is aware, and if any changes need to be made.    Per ED discharge on 02/05/17 PAF -  HR was sinus brady during hospital stay - he report HR ranging 50-60 at home  CHADSVASC score 5  Not on AC due to history of AVM Given EKG show 1st degree AV block, will discharge patient on Metoprolol and Cardizem only  Will hold Amiodarone until patient is seen by cardiology. If HR increase > 110 patient advised to resume Amio.  Tele only showed sinus brady during hospital stay  Follow up with cardiology.  Per Dr Sallyanne Kuster on 06/25/17 1. Afib with RVR: -NSR  -Diltiazem gtt stopped in ED -Will continue PO diltiazem to 180mg  PO QD -Continue lopressor 75mg  PO BID -Continue ASA and plavix given recent DES 07/2016 -Consider stopping Plavix after 1 year post-stent placement  and starting on Eliquis? Can discuss this further with GI if necessary.  -Will add Amiodarone 200mg  PO BID -He has tried Amiodarone in the past with subsequent bradycardia. This was stopped in March/April of this year.  -At discharge, will begin decreasing metoprolol and diltiazem as we increase his amiodarone. At this point, would lean more toward rhythm control as opposed to rate control.  -Will need close follow up as an outpatient during this transition -CHADS2-VASc=5 (CHF, vascular dz, HTN, DM, > 20 yo)

## 2017-08-30 NOTE — Progress Notes (Signed)
Cardiology Office Note:    Date:  09/06/2017   ID:  Michael [name], DOB 06/09/1941, MRN [id number]  PCP:  Michael Anger, MD  Cardiologist:  Dr. Jenkins [name]  PV: Dr. Kathlyn [name]   Electrophysiologist:  n/a  Referring MD: Michael Anger, MD   No chief complaint on file.   History of Present Illness:    Michael [name] is a 77 y.o. male, previously followed by Dr. Darlin [name], with a hx of CAD, carotid artery disease, paroxysmal AF, HTN, HL, DM, prior GI bleed (small bowel AVM).   He was admitted  in 4/16 with AF with RVR c/b congestive heart failure.  He was placed on Amiodarone and converted to NSR.  He had elevated Troponin levels again and a LHC demonstrated mild to mod CAD in the Dx, LCx, OM.  There was also innominate artery stenosis of borderline significance.  A follow up CTA demonstrated 50% R subclavian artery stenosis.  He saw Dr. Kathlyn [name] in 11/16 and his R arm symptoms were felt to mainly related to psoriatic arthritis and no vascular intervention was felt to be necessary.  First seen by me May 2017    Admitted 1/1-08/04/16  with a non-STEMI. Cardiac catheterization demonstrated 80% hazy stenosis involving the proximal OM1 felt to be the culprit lesion. This was treated with a Synergy DES. There was mild to moderate nonobstructive disease in the diagonal, LAD, AV groove circumflex and RCA. He had mildly elevated left and right heart filling pressures as well as mild pulmonary hypertension. Triple therapy was recommended with aspirin, clopidogrel and apixaban 1 month. After 30 days, aspirin could be discontinued. His creatinine increased after cardiac catheterization. Echocardiogram demonstrated normal ejection fraction, moderate diastolic dysfunction, moderate aortic stenosis and moderate pulmonary hypertension. His hospital course complicated by left wrist cellulitis with MSSA bacteremia requiring IV antibiotics as well as diarrhea with suspected C. difficile  (toxin was negative).  Readmitted 1/15-1/21/18  with acute on chronic diastolic CHF in the setting of worsening (heme+) anemia.  He required transfusion with PRBCs x 1.  His Eliquis was held. He developed AKI with diuresis. Capsule endoscopy by gastroenterology demonstrated multiple small AVMs in the mid small bowel which was the likely source of his anemia. He was continued on dual antiplatelet therapy given recent PCI with DES the setting of ACS. Eliquis was held at discharge.  Discharge creatinine 1.75. Discharge Hgb 8.9. Discharge weight 249 (-26 pounds).  Admitted again 06/26/17 with  Rapid PAF.  DAT was to finish 07/2017 was d/c on low dose amiodarone 200 mg cardizem 180 mg LA Lopressor 75 mg bid was seen by Dr Michael [name] in hospital   CHADS2-VASc=5 (CHF, vascular dz, HTN, DM, > 67 yo).  Baseline CR 2.1 with GFR 30   Myovue 06/26/17 reviewed with inferior / inferior lateral infarct EF 46% Echo: 03/30/17 EF 55-60% moderate AS mean gradient 25 mmHg   Prior CV studies that were reviewed today include:    [facility] 07/29/16 LAD proximal 40, mid 10, D1 60 LCx proximal 30, distal 30, OM1 80 RA mean 9, RV 43/11, PA 44/12, mean 26; PCWP 18 AV mean gradient 12, calculated AVA 1.78 cm PCI: 2.25 x 38 mm Synergy DES to the OM1  Echo 08/01/16 Severe LVH, EF 60-65, normal wall motion, grade 2 diastolic dysfunction, moderate aortic stenosis (mean 20, peak 37), MAC, mild LAE, PASP 53  Echo 12/17 Moderate concentric LVH, EF 60-65, normal wall motion, grade 1 diastolic dysfunction, moderate  aortic stenosis (mean 26), mild LAE, mild TR, PASP 52  Carotid US 5/17 Bilateral ICA 40-59, mild right subclavian steal Follow-up 1 year  LHC 4/16 LM:  widely patent. LAD:  widely patent; D1 50-70%  LCx:  Patent.  OM2 proximal eccentric 50-70%; mid CFX after origin of OM2 50% RI: widely patent. RCA:  widely patent.. INNOMINATE ANGIOGRAPHY: There is heavy calcification with at least 50% obstruction in the innominate  artery LV gram EF 65%..  Echo 3/16 Mild LVH, EF 55-60, mild LAE  Myoview 3/16 No ischemia or scar, EF 49.  Intermediate Risk  Past Medical History:  Diagnosis Date  . Acute on chronic heart failure ([place])   . Allergy   . Arthritis   . Atrial fibrillation with RVR ([place]) 06/25/2017   hx/notes 06/25/2017  . AVM (arteriovenous malformation) of colon   . C. difficile enteritis   . CAD (coronary artery disease)    a. Cath 10/2014 - Mild to moderate diagonal, circumflex and obtuse marginal disease. Innominate artery sternosis.  . Carotid artery disease ([place])    a. Carotid dopple 03/2014 49-20% RICA &  10-07% LICA stenosis b. 1/21  . CKD (chronic kidney disease), stage III ([place])   . Clostridium difficile colitis 05/13/2016  . Dyspnea   . Dysrhythmia    ATRIAL FIBRILATION  . Elevated troponin    a. 09/2014 in setting of AF RVR-->Myoview: EF 49%, no ischemia/infarct->Med Rx.  . GERD (gastroesophageal reflux disease)   . Gout   . H/O transfusion of whole blood   . Heart murmur   . History of PFTs    PFTs 6/16:  FVC 3.73 (87%), FEV1 2.93 (88%), FEV1/FVC 78%, DLCO 69%  . Hx of adenomatous colonic polyps 07/02/2016  . Hyperlipidemia   . Hypertension   . Hypothyroidism   . Iron deficiency anemia   . NSTEMI (non-ST elevated myocardial infarction) ([place])    hx/notes 06/25/2017  . PAF (paroxysmal atrial fibrillation) ([place])    a. 09/2014: Converted on Dilt;  b. CHA2DS2VASc = 3-->eliquis;  c. 09/2014 Echo: EF 55-60%, mild LVH, mildly dil LA.  [name] Personal history of colonic polyps 2007, 2008   adenoma each time, largest 12 mm in 2007  . Psoriasis   . Staph infection 07/2016   left hand   . Staphylococcus aureus bacteremia 07/31/2016  . Type II diabetes mellitus ([place])    TYPE 2    Past Surgical History:  Procedure Laterality Date  . APPENDECTOMY  02/09/2014  . CARDIAC CATHETERIZATION N/A 07/29/2016   Procedure: Right/Left Heart Cath and Coronary Angiography;  Surgeon: Nelva Bush, MD;   Location: [facility];  Service: Cardiovascular;  Laterality: N/A;  . CARDIAC CATHETERIZATION N/A 07/29/2016   Procedure: Coronary Stent Intervention;  Surgeon: Nelva Bush, MD;  Location: [facility];  Service: Cardiovascular;  Laterality: N/A;  . CARDIAC CATHETERIZATION  10/2014  . COLONOSCOPY  02/10/2011   internal hemorrhoids  . COLONOSCOPY W/ POLYPECTOMY  04/09/2006   12 mm adenoma  . COLONOSCOPY W/ POLYPECTOMY  06/17/2007   5 mm adenoma  . COLONOSCOPY WITH PROPOFOL N/A 05/13/2016   Procedure: COLONOSCOPY WITH PROPOFOL;  Surgeon: Manus Gunning, MD;  Location: [facility];  Service: Gastroenterology;  Laterality: N/A;  . ENTEROSCOPY N/A 08/14/2016   Procedure: ENTEROSCOPY;  Surgeon: Manus Gunning, MD;  Location: [facility] ENDOSCOPY;  Service: Gastroenterology;  Laterality: N/A;  . ESOPHAGOGASTRODUODENOSCOPY  12/23/2011   Procedure: ESOPHAGOGASTRODUODENOSCOPY (EGD);  Surgeon: Irene Shipper, MD;  Location: [facility];  Service: Endoscopy;  Laterality: N/A;  with small bowel bx's  . GIVENS CAPSULE STUDY  12/28/2011  . GIVENS CAPSULE STUDY N/A 08/14/2016   Procedure: GIVENS CAPSULE STUDY;  Surgeon: Manus Gunning, MD;  Location: [place];  Service: Gastroenterology;  Laterality: N/A;  . KNEE ARTHROSCOPY Right   . LAPAROSCOPIC APPENDECTOMY N/A 02/09/2014   Procedure: APPENDECTOMY LAPAROSCOPIC;  Surgeon: Leighton Ruff, MD;  Location: [facility];  Service: General;  Laterality: N/A;  . LEFT HEART CATHETERIZATION WITH CORONARY ANGIOGRAM N/A 11/15/2014   Procedure: LEFT HEART CATHETERIZATION WITH CORONARY ANGIOGRAM;  Surgeon: Belva Crome, MD;  Location: [facility] CATH LAB;  Service: Cardiovascular;  Laterality: N/A;  . SHOULDER OPEN ROTATOR CUFF REPAIR Left     Current Medications: Current Meds  Medication Sig  . allopurinol (ZYLOPRIM) 100 MG tablet Take 2 tablets (200 mg total) by mouth daily.  [name] amiodarone (PACERONE) 200 MG tablet Take 1 tablet (200 mg total) by  mouth daily.  [name] aspirin 81 MG chewable tablet Chew 1 tablet (81 mg total) by mouth daily.  [name] atorvastatin (LIPITOR) 40 MG tablet Take 1 tablet (40 mg total) by mouth daily.  [name] b complex vitamins tablet Take 1 tablet by mouth daily.  . Blood Glucose Monitoring Suppl (ONE TOUCH ULTRA 2) W/DEVICE KIT Use as directed Dx E11.9  . Blood Pressure Monitor KIT Use to check blood pressure daily Dx I10  . Cholecalciferol (VITAMIN D-3) 1000 units CAPS Take 1,000-5,000 Units by mouth See admin instructions. 1,000 units once a day on Sun/Mon/Tues/Wed/Thurs/Fri and 5,000 units on Sat  . diltiazem (CARTIA XT) 180 MG 24 hr capsule Take 1 capsule (180 mg total) by mouth daily.  . fenofibrate 160 MG tablet Take 160 mg by mouth daily.  . ferrous sulfate 325 (65 FE) MG tablet Take 325 mg by mouth 3 (three) times daily with meals.  . furosemide (LASIX) 40 MG tablet Take 80 mg by mouth 2 (two) times daily. 2 tablets po bid.  [name] gabapentin (NEURONTIN) 100 MG capsule Take 1 capsule (100 mg total) by mouth 2 (two) times daily.  [name] glipiZIDE (GLUCOTROL) 10 MG tablet Take 1 tablet (10 mg total) by mouth 2 (two) times daily before a meal.  . glucose blood (ONE TOUCH ULTRA TEST) test strip 1 each by Other route 4 (four) times daily. Use to check blood sugar four times a day  Dx: E11.9- insulin Dependant  . hydrALAZINE (APRESOLINE) 25 MG tablet Take 1 tablet (25 mg total) by mouth 3 (three) times daily.  [name] HYDROcodone-acetaminophen (NORCO/VICODIN) 5-325 MG tablet Take 1 tablet by mouth 2 (two) times daily as needed for moderate pain.  . Insulin Glargine (TOUJEO SOLOSTAR) 300 UNIT/ML SOPN Inject 52 Units into the skin daily. Toujeo  . Insulin Pen Needle 31G X 5 MM MISC 1 Units by Does not apply route 4 (four) times daily.  . Lancets (ONETOUCH ULTRASOFT) lancets Use to check blood sugars daily  . levothyroxine (SYNTHROID, LEVOTHROID) 150 MCG tablet Take 150 mcg by mouth daily before breakfast.  . metoprolol tartrate (LOPRESSOR)  25 MG tablet Take 3 tablets (75 mg total) by mouth 2 (two) times daily.  [name] omeprazole (PRILOSEC) 40 MG capsule Take 1 capsule (40 mg total) by mouth daily.  . potassium chloride SA (K-DUR,KLOR-CON) 20 MEQ tablet Take 1 tablet (20 mEq total) by mouth daily.  . [DISCONTINUED] clopidogrel (PLAVIX) 75 MG tablet Take 1 tablet (75 mg total) by mouth daily with breakfast.  Allergies:   Percocet [oxycodone-acetaminophen]; Metformin and related; and Invokana [canagliflozin]   Social History   Socioeconomic History  . Marital status: Married    Spouse name: None  . Number of children: None  . Years of education: 44  . Highest education level: None  Social Needs  . Financial resource strain: None  . Food insecurity - worry: None  . Food insecurity - inability: None  . Transportation needs - medical: None  . Transportation needs - non-medical: None  Occupational History  . Occupation:  Retired  Tobacco Use  . Smoking status: Former Smoker    Packs/day: 0.70    Years: 48.00    Pack years: 33.60    Types: Cigarettes    Last attempt to quit: 03/29/2006    Years since quitting: 11.4  . Smokeless tobacco: Never Used  Substance and Sexual Activity  . Alcohol use: Yes    Alcohol/week: 0.6 oz    Types: 1 Cans of beer per week    Comment: occasional alcohol intake  . Drug use: No  . Sexual activity: Not Currently  Other Topics Concern  . None  Social History Narrative   Regular exercise-no   Caffeine Use-yes     Family History:  The patient's family history includes COPD in his sister; Cancer in his brother; Diabetes in his father; Heart attack in his father; Heart disease in his brother; Hypertension in his father and mother; Lung cancer in his father; Stroke in his father and mother.   ROS:   Please see the history of present illness.    ROS All other systems reviewed and are negative.   EKGs/Labs/Other Test Reviewed:    EKG:  03/01/17 Sinus bradycardia, HR 53, normal axis, Q-wave  in lead 3, QTc 439 ms, no significant changes. 09/06/17 SR rate 54 nonspecific ST changes   Recent Labs: 06/24/2017: B Natriuretic Peptide 180.4 06/25/2017: TSH 3.293 06/26/2017: BUN 37; Creatinine, Ser 2.06; Hemoglobin 10.7; Platelets 118; Potassium 4.4; Sodium 135   Recent Lipid Panel    Component Value Date/Time   CHOL 101 08/01/2016 0440   TRIG 344 (H) 08/01/2016 0440   HDL 22 (L) 08/01/2016 0440   CHOLHDL 4.6 08/01/2016 0440   VLDL 69 (H) 08/01/2016 0440   LDLCALC 10 08/01/2016 0440   LDLDIRECT 26.0 03/20/2015 1003     Physical Exam:    VS:  BP (!) 141/62   Pulse (!) 57   Ht _0  (1.803 m)   Wt 248 lb (112.5 kg)   SpO2 98%   BMI 34.59 kg/m     Wt Readings from Last 3 Encounters:  09/06/17 248 lb (112.5 kg)  07/29/17 256 lb (116.1 kg)  06/26/17 249 lb 12.8 oz (113.3 kg)     Physical Exam  Constitutional: He is oriented to person, place, and time. No distress.  Obese white male  HENT:  Head: Normocephalic and atraumatic.  Eyes: No scleral icterus.  Neck: No JVD present.  Cardiovascular: Normal rate and regular rhythm.  Murmur heard.  Harsh systolic murmur is present with a grade of 2/6 at the upper right sternal border. AS murmur   Pulmonary/Chest: Effort normal. He has no wheezes. He has no rales.  Abdominal: He exhibits no distension. There is no tenderness.  Musculoskeletal: He exhibits edema.  Plus 2 bilateral   Neurological: He is alert and oriented to person, place, and time.  Skin: Skin is warm and dry.  Psychiatric: He has a normal mood and affect.  ASSESSMENT:    No diagnosis found. PLAN:    In order of problems listed above:  1. CAD - status post non-STEMI treated with DES to the OM1 07/29/16 . He had mild to moderate residual CAD in the LAD and LCx at catheterization. 81 mg ASA now stop plavix  Continue statin, beta blocker. Myovue 06/26/17 low risk No chest pain   2. Chronic diastolic CHF - continue lasix stable euvolemic   3.  Persistent AF - currently per Dr Michael [name] on cardizem lopressor and amiodarone eliquis Stopped due to GI bleed and AVM;s   4. History of GI bleed - This was in the setting of multiple small AVMs in the small bowel. Follow-up with GI as planned. Endo  08/19/16 with multiple small bowel AVM's    5. HTN - Well controlled.  Continue current medications and low sodium Dash type diet.  .  6. HL - LDL was 10 in 1/18. Continue statin.  7. R Subclavian stenosis - Follow-up with Dr. Fletcher Anon as planned.    8. Aortic stenosis - Moderate by recent echocardiogram 03/30/17 f/u in a year Mean gradient 25 mmHg peak 39 mmHg  .  9. CKD -  F/u primary baseline around 2.0  Lab Results  Component Value Date   CREATININE 2.06 (H) 06/26/2017   BUN 37 (H) 06/26/2017   NA 135 06/26/2017   K 4.4 06/26/2017   CL 100 (L) 06/26/2017   CO2 26 06/26/2017      Medication Adjustments/Labs and Tests Ordered: Current medicines are reviewed at length with the patient today.  Concerns regarding medicines are outlined above.  Medication changes, Labs and Tests ordered today are outlined in the Patient Instructions noted below. Patient Instructions  Medication Instructions:  Your physician recommends that you continue on your current medications as directed. Please refer to the Current Medication list given to you today.  Labwork: NONE  Testing/Procedures: NONE  Follow-Up: Your physician wants you to follow-up in: 6 months with Dr. Johnsie Cancel. You will receive a reminder letter in the mail two months in advance. If you don't receive a letter, please call our office to schedule the follow-up appointment.   If you need a refill on your cardiac medications before your next appointment, please call your pharmacy.     Signed, Michael Rouge, MD  09/06/2017 11:28 AM    [facility] [address], [place], [place]  [address] Phone: [phone]; Fax: [phone]

## 2017-09-06 ENCOUNTER — Ambulatory Visit (INDEPENDENT_AMBULATORY_CARE_PROVIDER_SITE_OTHER): Payer: Medicare Other | Admitting: Cardiovascular Disease

## 2017-09-06 ENCOUNTER — Encounter: Payer: Self-pay | Admitting: Cardiovascular Disease

## 2017-09-06 ENCOUNTER — Other Ambulatory Visit: Payer: Medicare Other

## 2017-09-06 VITALS — BP 141/62 | HR 57 | Ht 71.0 in | Wt 248.0 lb

## 2017-09-06 DIAGNOSIS — D649 Anemia, unspecified: Secondary | ICD-10-CM

## 2017-09-06 DIAGNOSIS — I480 Paroxysmal atrial fibrillation: Secondary | ICD-10-CM | POA: Diagnosis not present

## 2017-09-06 LAB — CBC WITH DIFFERENTIAL/PLATELET
Basophils Absolute: 20 cells/uL (ref 0–200)
Basophils Relative: 0.3 %
Eosinophils Absolute: 132 cells/uL (ref 15–500)
Eosinophils Relative: 2 %
HCT: 33.7 % — ABNORMAL LOW (ref 38.5–50.0)
Hemoglobin: 11.3 g/dL — ABNORMAL LOW (ref 13.2–17.1)
Lymphs Abs: 561 cells/uL — ABNORMAL LOW (ref 850–3900)
MCH: 30.1 pg (ref 27.0–33.0)
MCHC: 33.5 g/dL (ref 32.0–36.0)
MCV: 89.9 fL (ref 80.0–100.0)
MPV: 9.5 fL (ref 7.5–12.5)
Monocytes Relative: 8.8 %
Neutro Abs: 5306 cells/uL (ref 1500–7800)
Neutrophils Relative %: 80.4 %
Platelets: 155 10*3/uL (ref 140–400)
RBC: 3.75 10*6/uL — ABNORMAL LOW (ref 4.20–5.80)
RDW: 14.7 % (ref 11.0–15.0)
Total Lymphocyte: 8.5 %
WBC mixed population: 581 cells/uL (ref 200–950)
WBC: 6.6 10*3/uL (ref 3.8–10.8)

## 2017-09-06 NOTE — Addendum Note (Signed)
Addended by: Sallye Ober on: 09/06/2017 11:47 AM   Modules accepted: Orders

## 2017-09-06 NOTE — Patient Instructions (Addendum)
Medication Instructions:  Your physician has recommended you make the following change in your medication:  1-STOP Plavix   Labwork: NONE  Testing/Procedures: NONE  Follow-Up: Your physician wants you to follow-up in: 6 months with Dr. Nishan. You will receive a reminder letter in the mail two months in advance. If you don't receive a letter, please call our office to schedule the follow-up appointment.  If you need a refill on your cardiac medications before your next appointment, please call your pharmacy.   

## 2017-09-07 NOTE — Progress Notes (Signed)
Hemoglobin stable - mildly low as before - think from chronic kidney disease and doubt bleeding anymore but would stay on ferrous sulfate I see that Plavix has been stopped so less likely to bleed now  At this point will turn over to PCP for f/u CBC (has pending labs with Dr. Alain Marion) and I will see him as needed

## 2017-09-09 DIAGNOSIS — L409 Psoriasis, unspecified: Secondary | ICD-10-CM | POA: Diagnosis not present

## 2017-09-14 ENCOUNTER — Telehealth: Payer: Self-pay | Admitting: Internal Medicine

## 2017-09-14 NOTE — Telephone Encounter (Signed)
Refill Request.  

## 2017-09-14 NOTE — Telephone Encounter (Signed)
Refill  Request  Nelva Nay  Last  OFV 07/29/2017  Last Fill medication in chart  Showing as  [place]

## 2017-09-14 NOTE — Telephone Encounter (Signed)
Copied from [place]. Topic: Quick Communication - Rx Refill/Question >> Sep 14, 2017  3:10 PM Arletha Grippe wrote: Medication: Insulin Glargine (TOUJEO SOLOSTAR) 300 UNIT/ML SOPN   Has the patient contacted their pharmacy? No. New pharm - needs new rx   (Agent: If no, request that the patient contact the pharmacy for the refill.)   Preferred Pharmacy (with phone number or street name): [facility] [address]    Agent: Please be advised that RX refills may take up to 3 business days. We ask that you follow-up with your pharmacy.

## 2017-09-15 NOTE — Telephone Encounter (Signed)
Routing to dr plotnikov, are you ok with sending this is, please advise, thanks

## 2017-09-16 ENCOUNTER — Other Ambulatory Visit: Payer: Self-pay

## 2017-09-16 MED ORDER — INSULIN GLARGINE 300 UNIT/ML ~~LOC~~ SOPN: 52.0000 [IU] | PEN_INJECTOR | Freq: Every day | SUBCUTANEOUS | 12 refills | Status: DC

## 2017-09-16 NOTE — Telephone Encounter (Signed)
rx sent to pharm

## 2017-09-16 NOTE — Telephone Encounter (Signed)
Okay to refill for 12 months.  Thank you,

## 2017-10-27 ENCOUNTER — Ambulatory Visit (INDEPENDENT_AMBULATORY_CARE_PROVIDER_SITE_OTHER): Payer: Medicare Other | Admitting: Internal Medicine

## 2017-10-27 ENCOUNTER — Encounter: Payer: Self-pay | Admitting: Internal Medicine

## 2017-10-27 ENCOUNTER — Other Ambulatory Visit (INDEPENDENT_AMBULATORY_CARE_PROVIDER_SITE_OTHER): Payer: Medicare Other

## 2017-10-27 DIAGNOSIS — E1151 Type 2 diabetes mellitus with diabetic peripheral angiopathy without gangrene: Secondary | ICD-10-CM

## 2017-10-27 DIAGNOSIS — L4050 Arthropathic psoriasis, unspecified: Secondary | ICD-10-CM

## 2017-10-27 DIAGNOSIS — N183 Chronic kidney disease, stage 3 unspecified: Secondary | ICD-10-CM

## 2017-10-27 DIAGNOSIS — I480 Paroxysmal atrial fibrillation: Secondary | ICD-10-CM

## 2017-10-27 DIAGNOSIS — I5032 Chronic diastolic (congestive) heart failure: Secondary | ICD-10-CM | POA: Diagnosis not present

## 2017-10-27 DIAGNOSIS — E039 Hypothyroidism, unspecified: Secondary | ICD-10-CM

## 2017-10-27 DIAGNOSIS — I2510 Atherosclerotic heart disease of native coronary artery without angina pectoris: Secondary | ICD-10-CM

## 2017-10-27 LAB — CBC WITH DIFFERENTIAL/PLATELET
BASOS ABS: 0 10*3/uL (ref 0.0–0.1)
Basophils Relative: 0.8 % (ref 0.0–3.0)
Eosinophils Absolute: 0.1 10*3/uL (ref 0.0–0.7)
Eosinophils Relative: 3.4 % (ref 0.0–5.0)
HCT: 34.5 % — ABNORMAL LOW (ref 39.0–52.0)
Hemoglobin: 12.3 g/dL — ABNORMAL LOW (ref 13.0–17.0)
LYMPHS ABS: 0.6 10*3/uL — AB (ref 0.7–4.0)
Lymphocytes Relative: 14.5 % (ref 12.0–46.0)
MCHC: 35.7 g/dL (ref 30.0–36.0)
MCV: 88.3 fl (ref 78.0–100.0)
Monocytes Absolute: 0.3 10*3/uL (ref 0.1–1.0)
Monocytes Relative: 6.8 % (ref 3.0–12.0)
NEUTROS PCT: 74.5 % (ref 43.0–77.0)
Neutro Abs: 3.3 10*3/uL (ref 1.4–7.7)
Platelets: 176 10*3/uL (ref 150.0–400.0)
RBC: 3.91 Mil/uL — AB (ref 4.22–5.81)
RDW: 16.9 % — ABNORMAL HIGH (ref 11.5–15.5)
WBC: 4.4 10*3/uL (ref 4.0–10.5)

## 2017-10-27 LAB — HEPATIC FUNCTION PANEL
ALK PHOS: 39 U/L (ref 39–117)
ALT: 18 U/L (ref 0–53)
AST: 19 U/L (ref 0–37)
Albumin: 3.9 g/dL (ref 3.5–5.2)
BILIRUBIN DIRECT: 0.2 mg/dL (ref 0.0–0.3)
Total Bilirubin: 0.7 mg/dL (ref 0.2–1.2)
Total Protein: 6.8 g/dL (ref 6.0–8.3)

## 2017-10-27 LAB — BASIC METABOLIC PANEL
BUN: 36 mg/dL — ABNORMAL HIGH (ref 6–23)
CHLORIDE: 101 meq/L (ref 96–112)
CO2: 28 meq/L (ref 19–32)
CREATININE: 1.88 mg/dL — AB (ref 0.40–1.50)
Calcium: 8.9 mg/dL (ref 8.4–10.5)
GFR: 37.36 mL/min — ABNORMAL LOW (ref >60.00)
GLUCOSE: 328 mg/dL — AB (ref 70–99)
Potassium: 4.7 mEq/L (ref 3.5–5.1)
Sodium: 137 mEq/L (ref 135–145)

## 2017-10-27 LAB — TSH: TSH: 3.89 u[IU]/mL (ref 0.35–4.50)

## 2017-10-27 LAB — HEMOGLOBIN A1C: HEMOGLOBIN A1C: 8.5 % — AB (ref 4.6–6.5)

## 2017-10-27 NOTE — Assessment & Plan Note (Addendum)
ASA Diltiazem, Toprol, Amiodarone

## 2017-10-27 NOTE — Assessment & Plan Note (Signed)
Lopressor, Lipitor and ASA

## 2017-10-27 NOTE — Patient Instructions (Signed)
MC well w/Jill 

## 2017-10-27 NOTE — Progress Notes (Signed)
Subjective:  Patient ID: Michael [name], male    DOB: 1940/12/11  Age: 77 y.o. MRN: [id number]  CC: No chief complaint on file.   HPI ADRION MENZ presents for gout, DM, CAD, PAF f/u   Outpatient Medications Prior to Visit  Medication Sig Dispense Refill  . allopurinol (ZYLOPRIM) 100 MG tablet Take 2 tablets (200 mg total) by mouth daily. 180 tablet 1  . amiodarone (PACERONE) 200 MG tablet Take 1 tablet (200 mg total) by mouth daily. 90 tablet 0  . aspirin 81 MG chewable tablet Chew 1 tablet (81 mg total) by mouth daily. 90 tablet 1  . atorvastatin (LIPITOR) 40 MG tablet Take 1 tablet (40 mg total) by mouth daily. 90 tablet 1  . b complex vitamins tablet Take 1 tablet by mouth daily. 100 tablet 3  . Blood Glucose Monitoring Suppl (ONE TOUCH ULTRA 2) W/DEVICE KIT Use as directed Dx E11.9 1 each 0  . Blood Pressure Monitor KIT Use to check blood pressure daily Dx I10 1 each 0  . Cholecalciferol (VITAMIN D-3) 1000 units CAPS Take 1,000-5,000 Units by mouth See admin instructions. 1,000 units once a day on Sun/Mon/Tues/Wed/Thurs/Fri and 5,000 units on Sat    . diltiazem (CARTIA XT) 180 MG 24 hr capsule Take 1 capsule (180 mg total) by mouth daily. 90 capsule 3  . fenofibrate 160 MG tablet Take 160 mg by mouth daily.    . ferrous sulfate 325 (65 FE) MG tablet Take 325 mg by mouth 3 (three) times daily with meals.    . furosemide (LASIX) 40 MG tablet Take 80 mg by mouth 2 (two) times daily. 2 tablets po bid.    [name] gabapentin (NEURONTIN) 100 MG capsule Take 1 capsule (100 mg total) by mouth 2 (two) times daily. 180 capsule 1  . glipiZIDE (GLUCOTROL) 10 MG tablet Take 1 tablet (10 mg total) by mouth 2 (two) times daily before a meal. 180 tablet 1  . glucose blood (ONE TOUCH ULTRA TEST) test strip 1 each by Other route 4 (four) times daily. Use to check blood sugar four times a day  Dx: E11.9- insulin Dependant 360 each 1  . hydrALAZINE (APRESOLINE) 25 MG tablet Take 1 tablet (25 mg total) by  mouth 3 (three) times daily. 270 tablet 1  . HYDROcodone-acetaminophen (NORCO/VICODIN) 5-325 MG tablet Take 1 tablet by mouth 2 (two) times daily as needed for moderate pain. 60 tablet 0  . Insulin Glargine (TOUJEO SOLOSTAR) 300 UNIT/ML SOPN Inject 52 Units into the skin daily. Toujeo 300 mL 12  . Insulin Pen Needle 31G X 5 MM MISC 1 Units by Does not apply route 4 (four) times daily. 150 each 11  . Lancets (ONETOUCH ULTRASOFT) lancets Use to check blood sugars daily 30 each 11  . levothyroxine (SYNTHROID, LEVOTHROID) 150 MCG tablet Take 150 mcg by mouth daily before breakfast.    . metoprolol tartrate (LOPRESSOR) 25 MG tablet Take 3 tablets (75 mg total) by mouth 2 (two) times daily. 540 tablet 1  . omeprazole (PRILOSEC) 40 MG capsule Take 1 capsule (40 mg total) by mouth daily. 90 capsule 1  . potassium chloride SA (K-DUR,KLOR-CON) 20 MEQ tablet Take 1 tablet (20 mEq total) by mouth daily. 90 tablet 1  . nitroGLYCERIN (NITROSTAT) 0.4 MG SL tablet Place 1 tablet (0.4 mg total) under the tongue every 5 (five) minutes as needed for chest pain. 100 tablet 0   No facility-administered medications prior to visit.  ROS Review of Systems  Constitutional: Negative for appetite change, fatigue and unexpected weight change.  HENT: Negative for congestion, nosebleeds, sneezing, sore throat and trouble swallowing.   Eyes: Negative for itching and visual disturbance.  Respiratory: Negative for cough.   Cardiovascular: Negative for chest pain, palpitations and leg swelling.  Gastrointestinal: Negative for abdominal distention, blood in stool, diarrhea and nausea.  Genitourinary: Negative for frequency and hematuria.  Musculoskeletal: Positive for arthralgias and neck stiffness. Negative for back pain, gait problem, joint swelling and neck pain.  Skin: Negative for rash.  Neurological: Negative for dizziness, tremors, speech difficulty and weakness.  Psychiatric/Behavioral: Negative for agitation,  dysphoric mood and sleep disturbance. The patient is not nervous/anxious.     Objective:  BP 138/62 (BP Location: Left Arm, Patient Position: Sitting, Cuff Size: Large)   Pulse (!) 46   Temp 98 F (36.7 C) (Oral)   Ht _0  (1.803 m)   Wt 255 lb (115.7 kg)   SpO2 98%   BMI 35.57 kg/m   BP Readings from Last 3 Encounters:  10/27/17 138/62  09/06/17 (!) 141/62  07/29/17 (!) 146/54    Wt Readings from Last 3 Encounters:  10/27/17 255 lb (115.7 kg)  09/06/17 248 lb (112.5 kg)  07/29/17 256 lb (116.1 kg)    Physical Exam  Constitutional: He is oriented to person, place, and time. He appears well-developed. No distress.  NAD  HENT:  Mouth/Throat: Oropharynx is clear and moist.  Eyes: Pupils are equal, round, and reactive to light. Conjunctivae are normal.  Neck: Normal range of motion. No JVD present. No thyromegaly present.  Cardiovascular: Normal rate, regular rhythm, normal heart sounds and intact distal pulses. Exam reveals no gallop and no friction rub.  No murmur heard. Pulmonary/Chest: Effort normal and breath sounds normal. No respiratory distress. He has no wheezes. He has no rales. He exhibits no tenderness.  Abdominal: Soft. Bowel sounds are normal. He exhibits no distension and no mass. There is no tenderness. There is no rebound and no guarding.  Musculoskeletal: Normal range of motion. He exhibits tenderness. He exhibits no edema.  Lymphadenopathy:    He has no cervical adenopathy.  Neurological: He is alert and oriented to person, place, and time. He has normal reflexes. No cranial nerve deficit. He exhibits normal muscle tone. He displays a negative Romberg sign. Coordination and gait normal.  Skin: Skin is warm and dry. No rash noted.  Psychiatric: He has a normal mood and affect. His behavior is normal. Judgment and thought content normal.  arthritic hands Stiff back Dry skin  Lab Results  Component Value Date   WBC 6.6 09/06/2017   HGB 11.3 (L)  09/06/2017   HCT 33.7 (L) 09/06/2017   PLT 155 09/06/2017   GLUCOSE 299 (H) 06/26/2017   CHOL 101 08/01/2016   TRIG 344 (H) 08/01/2016   HDL 22 (L) 08/01/2016   LDLDIRECT 26.0 03/20/2015   LDLCALC 10 08/01/2016   ALT 9 (L) 08/10/2016   AST 23 08/10/2016   NA 135 06/26/2017   K 4.4 06/26/2017   CL 100 (L) 06/26/2017   CREATININE 2.06 (H) 06/26/2017   BUN 37 (H) 06/26/2017   CO2 26 06/26/2017   TSH 3.293 06/25/2017   PSA 0.50 11/07/2013   INR 1.06 12/08/2016   HGBA1C 6.0 04/14/2017   MICROALBUR 1.2 05/16/2013    Dg Chest 2 View  Result Date: 06/24/2017 CLINICAL DATA:  Chest pain. EXAM: CHEST  2 VIEW COMPARISON:  Most recent radiograph  02/05/2017 FINDINGS: Cardiomegaly, similar to decreased from prior year. Unchanged mediastinal contours with aortic atherosclerosis. Persistent left pleural effusion, but decreased from prior exam. Mild central pulmonary edema. No new airspace disease or pneumothorax. No acute osseous abnormality. IMPRESSION: Cardiomegaly with mild pulmonary edema and small left pleural effusion, improved from most recent comparison. Electronically Signed   By: Jeb Levering M.D.   On: 06/24/2017 23:10    Assessment & Plan:   There are no diagnoses linked to this encounter. I am having Michael [name] maintain his ferrous sulfate, ONE TOUCH ULTRA 2, onetouch ultrasoft, Blood Pressure Monitor, Insulin Pen Needle, b complex vitamins, allopurinol, aspirin, atorvastatin, gabapentin, glipiZIDE, hydrALAZINE, omeprazole, potassium chloride SA, metoprolol tartrate, glucose blood, nitroGLYCERIN, Vitamin D-3, HYDROcodone-acetaminophen, diltiazem, amiodarone, furosemide, fenofibrate, levothyroxine, and Insulin Glargine.  No orders of the defined types were placed in this encounter.    Follow-up: No follow-ups on file.  [name], MD

## 2017-10-27 NOTE — Assessment & Plan Note (Signed)
Diltiazem, Toprol, Furosemide, Hydralazine, Amiodarone 

## 2017-10-27 NOTE — Assessment & Plan Note (Signed)
Toujeo and Glipizide

## 2017-11-06 ENCOUNTER — Other Ambulatory Visit: Payer: Self-pay | Admitting: Cardiovascular Disease

## 2017-11-06 ENCOUNTER — Other Ambulatory Visit: Payer: Self-pay | Admitting: Internal Medicine

## 2017-12-05 IMAGING — DX DG CHEST 2V
2 series · 2 of 2 positions shown · non-contrast
Comparison: PA and lateral chest x-ray April 15, 2016

CLINICAL DATA: Follow-up of pneumonia. No current complaints.
Former smoker. History of coronary artery disease.

EXAM:
CHEST  2 VIEW

[chest pa]
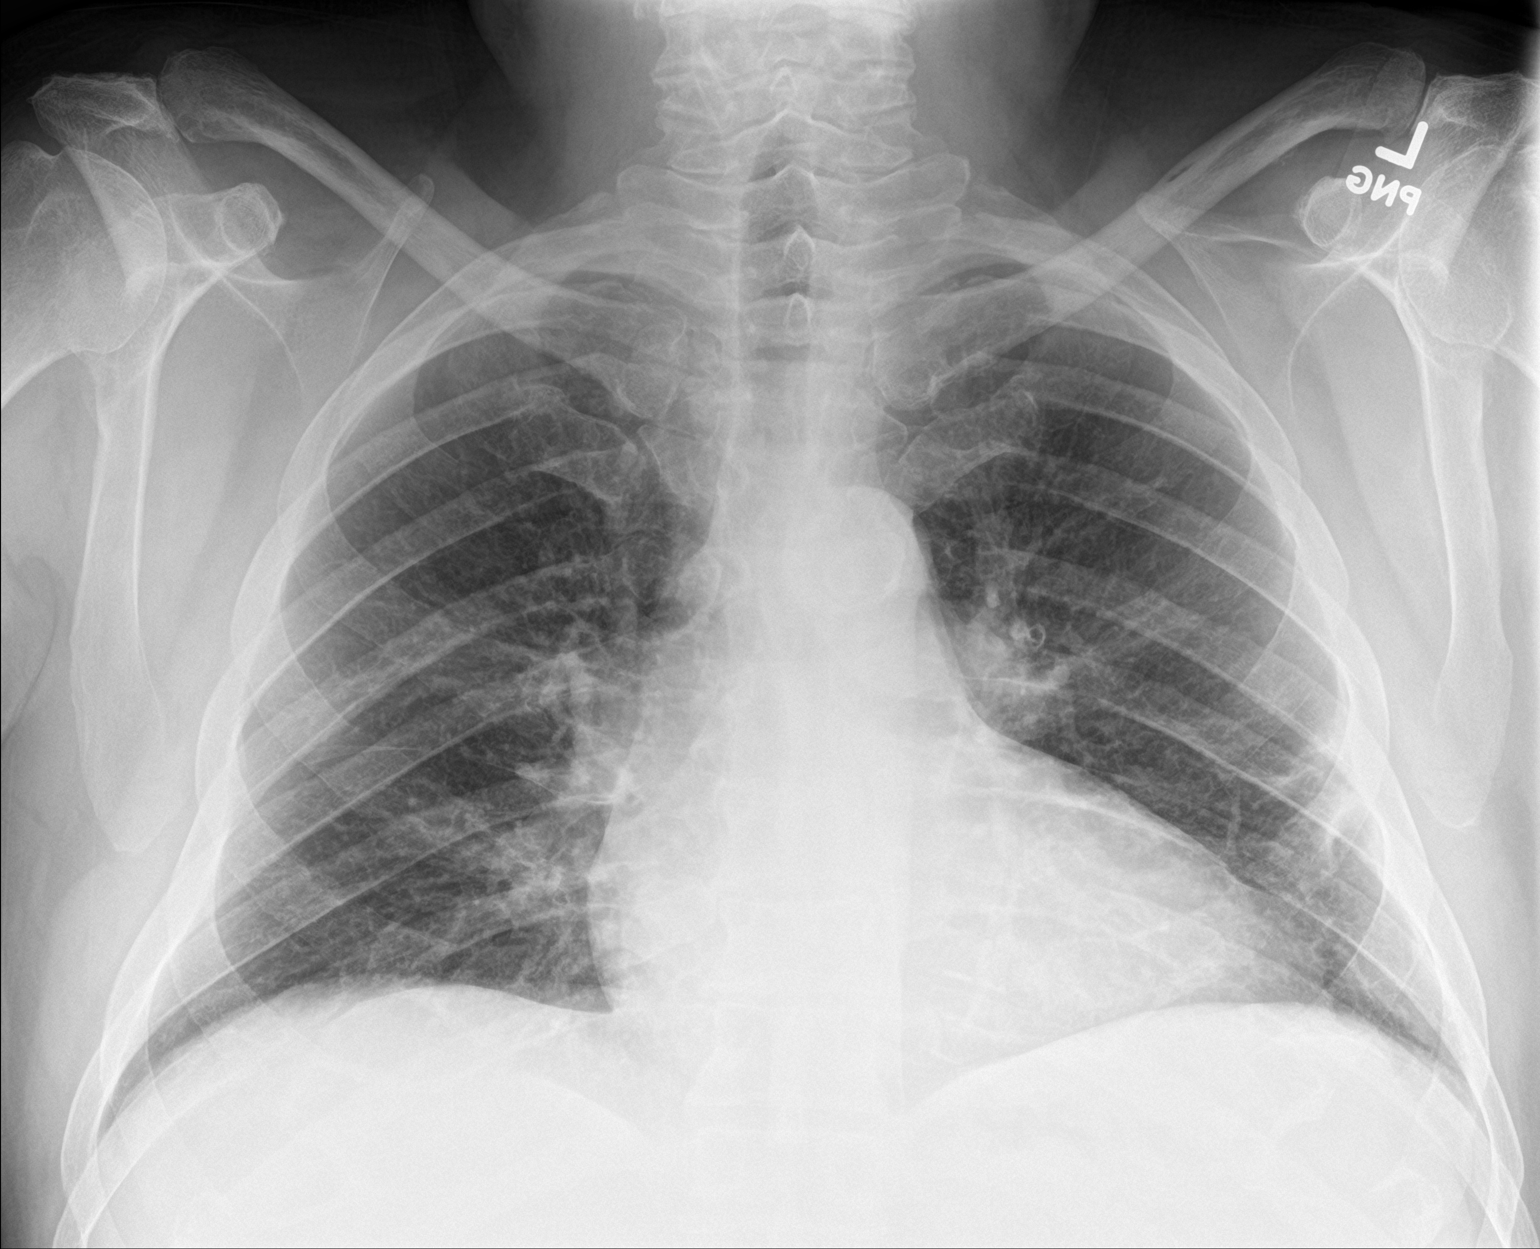

[chest lat]
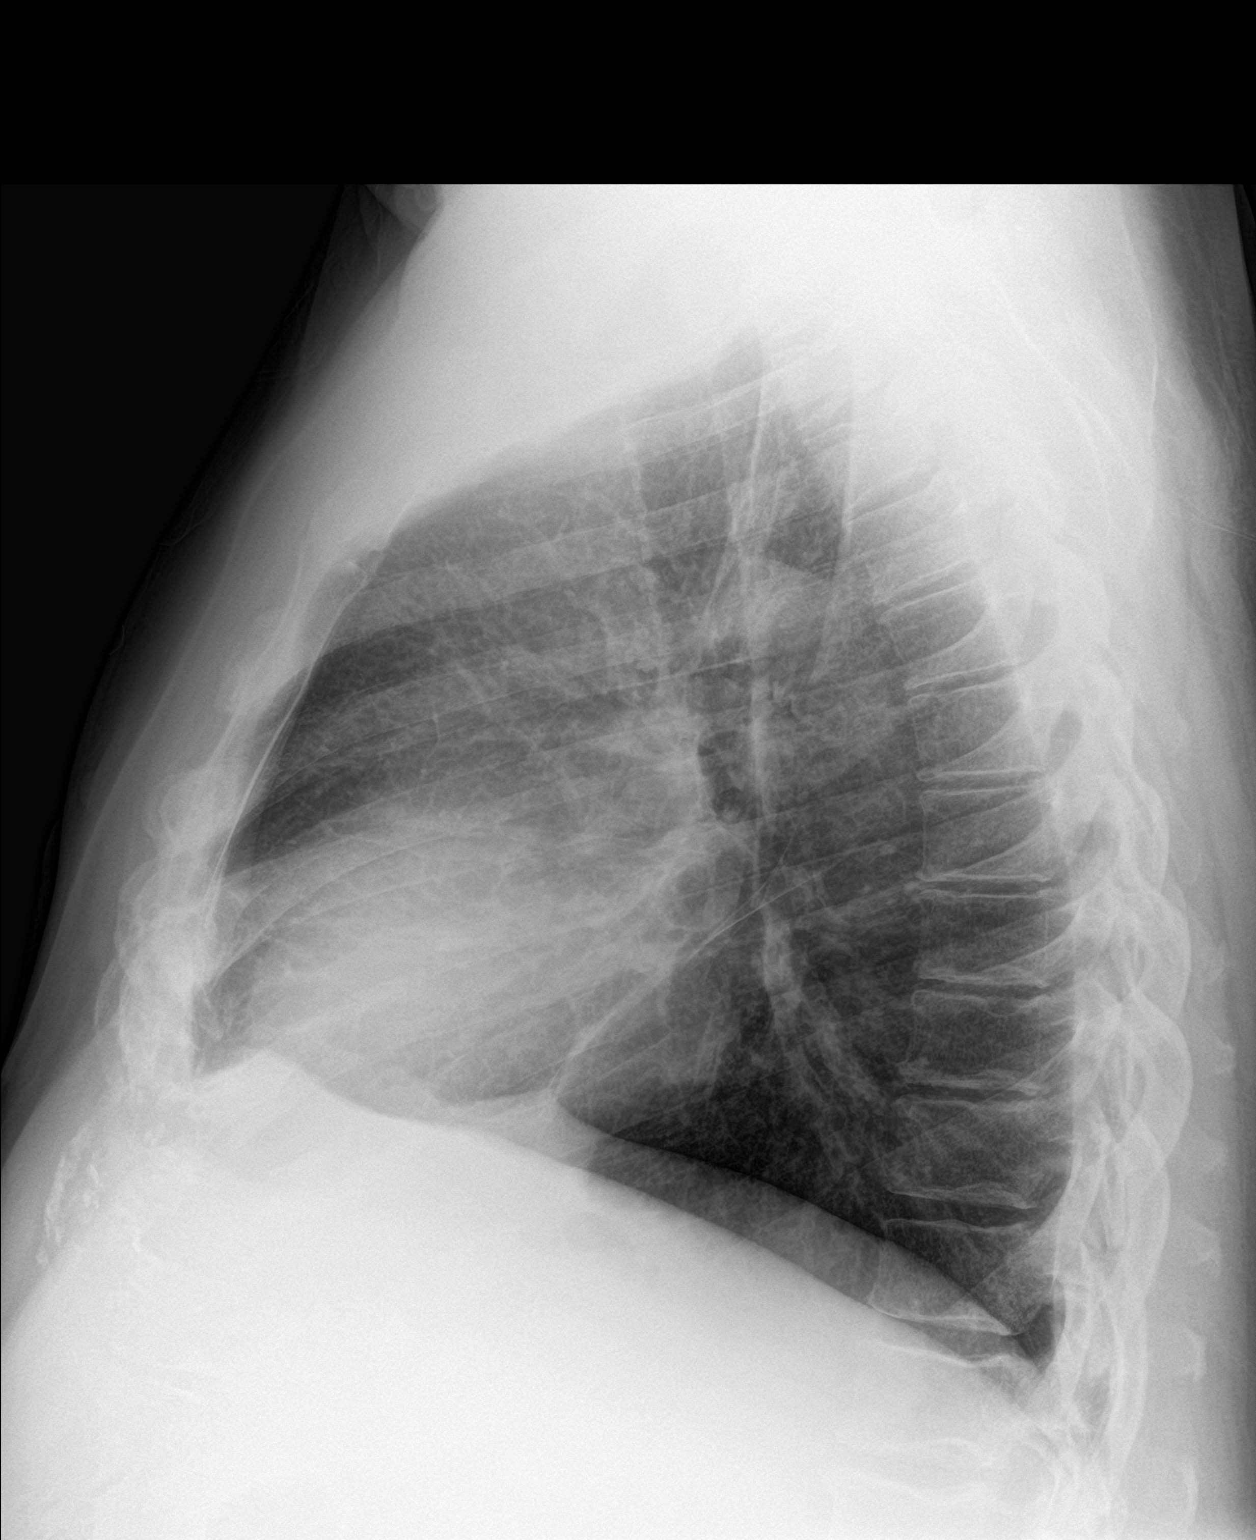

[2 of 2 positions shown; findings below may reference images not displayed]

FINDINGS: The lungs arm adequately inflated. The left lateral parenchymal
density is less conspicuous but has not cleared completely. There
remain mildly increased perihilar lung markings on the right. There
is stable mild cardiomegaly without pulmonary vascular congestion.
There is calcification in the wall of the aortic arch. The bony
thorax exhibits no acute abnormality.
IMPRESSION: Slight further interval clearing of the peripheral infiltrate in the
left lower lung laterally. Stable changes elsewhere. Given the
patient's clinical improvement, an additional follow-up chest x-ray
in 3-4 weeks is recommended. If the patient's symptoms worsen in the
mean time, chest CT scanning would likely be the most useful next
imaging step.

Stable cardiomegaly.  Aortic atherosclerosis.

## 2017-12-28 ENCOUNTER — Emergency Department (HOSPITAL_COMMUNITY)
Admission: EM | Admit: 2017-12-28 | Discharge: 2017-12-28 | Disposition: A | Discharge status: Home / Self Care | Payer: Medicare Other | Attending: Emergency Medicine | Admitting: Emergency Medicine

## 2017-12-28 ENCOUNTER — Encounter (HOSPITAL_COMMUNITY): Payer: Self-pay | Admitting: Emergency Medicine

## 2017-12-28 ENCOUNTER — Emergency Department (HOSPITAL_COMMUNITY): Payer: Medicare Other

## 2017-12-28 DIAGNOSIS — Z794 Long term (current) use of insulin: Secondary | ICD-10-CM | POA: Diagnosis not present

## 2017-12-28 DIAGNOSIS — N183 Chronic kidney disease, stage 3 (moderate): Secondary | ICD-10-CM | POA: Insufficient documentation

## 2017-12-28 DIAGNOSIS — I130 Hypertensive heart and chronic kidney disease with heart failure and stage 1 through stage 4 chronic kidney disease, or unspecified chronic kidney disease: Secondary | ICD-10-CM | POA: Insufficient documentation

## 2017-12-28 DIAGNOSIS — R001 Bradycardia, unspecified: Secondary | ICD-10-CM | POA: Insufficient documentation

## 2017-12-28 DIAGNOSIS — R42 Dizziness and giddiness: Secondary | ICD-10-CM | POA: Diagnosis not present

## 2017-12-28 DIAGNOSIS — Z87891 Personal history of nicotine dependence: Secondary | ICD-10-CM | POA: Diagnosis not present

## 2017-12-28 DIAGNOSIS — Z79899 Other long term (current) drug therapy: Secondary | ICD-10-CM | POA: Insufficient documentation

## 2017-12-28 DIAGNOSIS — E039 Hypothyroidism, unspecified: Secondary | ICD-10-CM | POA: Insufficient documentation

## 2017-12-28 DIAGNOSIS — I5032 Chronic diastolic (congestive) heart failure: Secondary | ICD-10-CM | POA: Insufficient documentation

## 2017-12-28 DIAGNOSIS — Z7982 Long term (current) use of aspirin: Secondary | ICD-10-CM | POA: Diagnosis not present

## 2017-12-28 DIAGNOSIS — R609 Edema, unspecified: Secondary | ICD-10-CM | POA: Insufficient documentation

## 2017-12-28 DIAGNOSIS — R0602 Shortness of breath: Secondary | ICD-10-CM

## 2017-12-28 DIAGNOSIS — E1122 Type 2 diabetes mellitus with diabetic chronic kidney disease: Secondary | ICD-10-CM | POA: Insufficient documentation

## 2017-12-28 DIAGNOSIS — I2510 Atherosclerotic heart disease of native coronary artery without angina pectoris: Secondary | ICD-10-CM | POA: Diagnosis not present

## 2017-12-28 DIAGNOSIS — R5383 Other fatigue: Secondary | ICD-10-CM | POA: Diagnosis not present

## 2017-12-28 LAB — I-STAT TROPONIN, ED: Troponin i, poc: 0.02 ng/mL (ref 0.00–0.08)

## 2017-12-28 LAB — COMPREHENSIVE METABOLIC PANEL
ALT: 16 U/L — AB (ref 17–63)
AST: 32 U/L (ref 15–41)
Albumin: 3.7 g/dL (ref 3.5–5.0)
Alkaline Phosphatase: 39 U/L (ref 38–126)
Anion gap: 9 (ref 5–15)
BUN: 33 mg/dL — ABNORMAL HIGH (ref 6–20)
CO2: 24 mmol/L (ref 22–32)
Calcium: 8.7 mg/dL — ABNORMAL LOW (ref 8.9–10.3)
Chloride: 105 mmol/L (ref 101–111)
Creatinine, Ser: 2.27 mg/dL — ABNORMAL HIGH (ref 0.61–1.24)
GFR, EST AFRICAN AMERICAN: 31 mL/min — AB (ref >60)
GFR, EST NON AFRICAN AMERICAN: 27 mL/min — AB (ref >60)
Glucose, Bld: 355 mg/dL — ABNORMAL HIGH (ref 65–99)
POTASSIUM: 5.5 mmol/L — AB (ref 3.5–5.1)
SODIUM: 138 mmol/L (ref 135–145)
Total Bilirubin: 1.4 mg/dL — ABNORMAL HIGH (ref 0.3–1.2)
Total Protein: 6.3 g/dL — ABNORMAL LOW (ref 6.5–8.1)

## 2017-12-28 LAB — CBC
HEMATOCRIT: 33 % — AB (ref 39.0–52.0)
Hemoglobin: 11.1 g/dL — ABNORMAL LOW (ref 13.0–17.0)
MCH: 30.5 pg (ref 26.0–34.0)
MCHC: 33.6 g/dL (ref 30.0–36.0)
MCV: 90.7 fL (ref 78.0–100.0)
Platelets: 154 10*3/uL (ref 150–400)
RBC: 3.64 MIL/uL — AB (ref 4.22–5.81)
RDW: 16.5 % — ABNORMAL HIGH (ref 11.5–15.5)
WBC: 4.4 10*3/uL (ref 4.0–10.5)

## 2017-12-28 LAB — CBG MONITORING, ED: GLUCOSE-CAPILLARY: 199 mg/dL — AB (ref 65–99)

## 2017-12-28 LAB — D-DIMER, QUANTITATIVE (NOT AT ARMC): D DIMER QUANT: 0.35 ug{FEU}/mL (ref 0.00–0.50)

## 2017-12-28 LAB — BRAIN NATRIURETIC PEPTIDE: B Natriuretic Peptide: 333.4 pg/mL — ABNORMAL HIGH (ref 0.0–100.0)

## 2017-12-28 NOTE — ED Triage Notes (Signed)
Pt arrives to ED with sob and fatigue over the last week. Pt has history of needing blood transfusion. Pt denies any pain.

## 2017-12-28 NOTE — ED Notes (Signed)
Signature Pad is not working. Pt instructed on D/C information and stated understanding

## 2017-12-28 NOTE — ED Provider Notes (Signed)
[facility] EMERGENCY DEPARTMENT Provider Note   CSN: [id number] Arrival date & time: 12/28/17  1149     History   Chief Complaint Chief Complaint  Patient presents with  . Shortness of Breath    HPI Michael [name] is a 77 y.o. male.  77 year old male with extensive past medical history including CAD, CKD, T2DM, A fib not on anticoagulation due to history of GI bleed who presents with fatigue and shortness of breath.  The patient reports 1 week history of fatigue, generalized weakness, and shortness of breath that is worse with bending over and sometimes with exertion.  He reports occasional heart fluttering sensation but no chest pain.  No PND or orthopnea, no major changes in weight.  He has had some lightheadedness/dizziness and feeling unsteady on his feet.  Wife was here today for clinic appointment and they decided to bring him to the ED to be evaluated.  He denies any fevers, vomiting, diarrhea, bloody stools, urinary symptoms, cough/cold symptoms, or recent illness.  No abdominal pain.  He does report some sinus drainage.  No recent changes to medications.  He states that he is compliant with medications.  The history is provided by the spouse and the patient.  Shortness of Breath     Past Medical History:  Diagnosis Date  . Acute on chronic heart failure ([place])   . Allergy   . Arthritis   . Atrial fibrillation with RVR ([place]) 06/25/2017   hx/notes 06/25/2017  . AVM (arteriovenous malformation) of colon   . C. difficile enteritis   . CAD (coronary artery disease)    a. Cath 10/2014 - Mild to moderate diagonal, circumflex and obtuse marginal disease. Innominate artery sternosis.  . Carotid artery disease ([place])    a. Carotid dopple 03/2014 52-77% RICA &  82-42% LICA stenosis b. 3/53  . CKD (chronic kidney disease), stage III ([place])   . Clostridium difficile colitis 05/13/2016  . Dyspnea   . Dysrhythmia    ATRIAL FIBRILATION  . Elevated troponin    a.  09/2014 in setting of AF RVR-->Myoview: EF 49%, no ischemia/infarct->Med Rx.  . GERD (gastroesophageal reflux disease)   . Gout   . H/O transfusion of whole blood   . Heart murmur   . History of PFTs    PFTs 6/16:  FVC 3.73 (87%), FEV1 2.93 (88%), FEV1/FVC 78%, DLCO 69%  . Hx of adenomatous colonic polyps 07/02/2016  . Hyperlipidemia   . Hypertension   . Hypothyroidism   . Iron deficiency anemia   . NSTEMI (non-ST elevated myocardial infarction) ([place])    hx/notes 06/25/2017  . PAF (paroxysmal atrial fibrillation) ([place])    a. 09/2014: Converted on Dilt;  b. CHA2DS2VASc = 3-->eliquis;  c. 09/2014 Echo: EF 55-60%, mild LVH, mildly dil LA.  [name] Personal history of colonic polyps 2007, 2008   adenoma each time, largest 12 mm in 2007  . Psoriasis   . Staph infection 07/2016   left hand   . Staphylococcus aureus bacteremia 07/31/2016  . Type II diabetes mellitus ([place])    TYPE 2    Patient Active Problem List   Diagnosis Date Noted  . Atrial fibrillation with rapid ventricular response ([place]) 06/25/2017  . Atrial fibrillation with RVR ([place]) 06/25/2017  . GERD (gastroesophageal reflux disease) 02/05/2017  . PAF (paroxysmal atrial fibrillation) ([place]) 02/05/2017  . CKD (chronic kidney disease), stage III ([place]) 02/05/2017  . Acute on chronic diastolic (congestive) heart failure ([place]) 02/05/2017  . History  of GI bleed 08/26/2016  . Septic phlebitis of upper extremity 08/26/2016  . Coronary artery disease involving native coronary artery of native heart without angina pectoris 07/31/2016  . PAD (peripheral artery disease) ([place]) 07/31/2016  . Septic thrombophlebitis of upper extremity 07/31/2016  . History of Clostridium difficile colitis 07/31/2016  . Nonrheumatic aortic valve stenosis   . Chronic diastolic CHF (congestive heart failure) ([place]) 07/27/2016  . Hx of adenomatous colonic polyps 07/02/2016  . Shortness of breath 06/05/2016  . Hypothyroidism 03/17/2016  . Noncompliance with diet and  medication regimen 12/16/2015  . Frequency-urgency syndrome 03/08/2015  . Subclavian artery stenosis, right ([place]) 01/16/2015  . Cholelithiasis 01/16/2015  . Restless leg syndrome 01/10/2015  . Postherpetic neuralgia 12/18/2014  . Persistent atrial fibrillation ([place])   . Hyperlipidemia   . Essential hypertension   . Psoriasis 03/19/2014  . Vitamin D deficiency 11/13/2013  . Carotid stenosis 11/06/2013  . Psoriatic arthritis ([place]) 11/06/2013  . Hypertriglyceridemia 05/16/2013  . Morbid obesity due to excess calories ([place]) 09/10/2012  . Gout 09/10/2012  . AVM (arteriovenous malformation) of small bowel, acquired with hemorrhage 01/20/2012  . Iron deficiency anemia due to chronic blood loss 01/20/2012  . DM (diabetes mellitus), type 2 with peripheral vascular complications ([place]) 60/63/0160    Past Surgical History:  Procedure Laterality Date  . APPENDECTOMY  02/09/2014  . CARDIAC CATHETERIZATION N/A 07/29/2016   Procedure: Right/Left Heart Cath and Coronary Angiography;  Surgeon: Nelva Bush, MD;  Location: [facility];  Service: Cardiovascular;  Laterality: N/A;  . CARDIAC CATHETERIZATION N/A 07/29/2016   Procedure: Coronary Stent Intervention;  Surgeon: Nelva Bush, MD;  Location: [facility];  Service: Cardiovascular;  Laterality: N/A;  . CARDIAC CATHETERIZATION  10/2014  . COLONOSCOPY  02/10/2011   internal hemorrhoids  . COLONOSCOPY W/ POLYPECTOMY  04/09/2006   12 mm adenoma  . COLONOSCOPY W/ POLYPECTOMY  06/17/2007   5 mm adenoma  . COLONOSCOPY WITH PROPOFOL N/A 05/13/2016   Procedure: COLONOSCOPY WITH PROPOFOL;  Surgeon: Manus Gunning, MD;  Location: [facility]OSCOPY;  Service: Gastroenterology;  Laterality: N/A;  . ENTEROSCOPY N/A 08/14/2016   Procedure: ENTEROSCOPY;  Surgeon: Manus Gunning, MD;  Location: [facility] ENDOSCOPY;  Service: Gastroenterology;  Laterality: N/A;  . ESOPHAGOGASTRODUODENOSCOPY  12/23/2011   Procedure: ESOPHAGOGASTRODUODENOSCOPY  (EGD);  Surgeon: Irene Shipper, MD;  Location: [facility];  Service: Endoscopy;  Laterality: N/A;  with small bowel bx's  . GIVENS CAPSULE STUDY  12/28/2011  . GIVENS CAPSULE STUDY N/A 08/14/2016   Procedure: GIVENS CAPSULE STUDY;  Surgeon: Manus Gunning, MD;  Location: [facility];  Service: Gastroenterology;  Laterality: N/A;  . KNEE ARTHROSCOPY Right   . LAPAROSCOPIC APPENDECTOMY N/A 02/09/2014   Procedure: APPENDECTOMY LAPAROSCOPIC;  Surgeon: Leighton Ruff, MD;  Location: [facility] ORS;  Service: General;  Laterality: N/A;  . LEFT HEART CATHETERIZATION WITH CORONARY ANGIOGRAM N/A 11/15/2014   Procedure: LEFT HEART CATHETERIZATION WITH CORONARY ANGIOGRAM;  Surgeon: Belva Crome, MD;  Location: [facility] CATH LAB;  Service: Cardiovascular;  Laterality: N/A;  . SHOULDER OPEN ROTATOR CUFF REPAIR Left         Home Medications    Prior to Admission medications   Medication Sig Start Date End Date Taking? Authorizing Provider  allopurinol (ZYLOPRIM) 100 MG tablet Take 2 tablets (283m total) by mouth daily 11/08/17   [name], AEvie Lacks MD  amiodarone (PACERONE) 200 MG tablet Take 1 tablet by mouth every day 11/09/17   NJosue Hector MD  aspirin  81 MG chewable tablet Chew 1 tablet (81 mg total) by mouth daily. 04/28/17   [name], Evie Lacks, MD  atorvastatin (LIPITOR) 40 MG tablet Take 1 tablet by mouth daily 11/08/17   [name], Evie Lacks, MD  b complex vitamins tablet Take 1 tablet by mouth daily. 04/14/17   [name], Evie Lacks, MD  Blood Glucose Monitoring Suppl (ONE TOUCH ULTRA 2) W/DEVICE KIT Use as directed Dx E11.9 05/18/14   [name], Evie Lacks, MD  Blood Pressure Monitor KIT Use to check blood pressure daily Dx I10 10/04/15   [name], Evie Lacks, MD  Cholecalciferol (VITAMIN D-3) 1000 units CAPS Take 1,000-5,000 Units by mouth See admin instructions. 1,000 units once a day on Sun/Mon/Tues/Wed/Thurs/Fri and 5,000 units on Sat    [provider]  diltiazem (CARTIA XT) 180 MG 24  hr capsule Take 1 capsule (180 mg total) by mouth daily. 08/09/17   Wellington Hampshire, MD  fenofibrate 160 MG tablet Take 160 mg by mouth daily.    [provider]  ferrous sulfate 325 (65 FE) MG tablet Take 325 mg by mouth 3 (three) times daily with meals.    [provider]  furosemide (LASIX) 40 MG tablet Take 80 mg by mouth 2 (two) times daily. 2 tablets po bid.    [provider]  gabapentin (NEURONTIN) 100 MG capsule Take 1 capsule by mouth twice a day 11/08/17   [name], Evie Lacks, MD  glipiZIDE (GLUCOTROL) 10 MG tablet Take 1 tablet by mouth twice a day before meals 11/08/17   [name], Evie Lacks, MD  glucose blood (ONE TOUCH ULTRA TEST) test strip 1 each by Other route 4 (four) times daily. Use to check blood sugar four times a day  Dx: E11.9- insulin Dependant 05/17/17   [name], Evie Lacks, MD  hydrALAZINE (APRESOLINE) 25 MG tablet Take 1 tablet by mouth 3 times a day 11/08/17   [name], Evie Lacks, MD  HYDROcodone-acetaminophen (NORCO/VICODIN) 5-325 MG tablet Take 1 tablet by mouth 2 (two) times daily as needed for moderate pain. 07/29/17 07/29/18  [name], Evie Lacks, MD  Insulin Glargine (TOUJEO SOLOSTAR) 300 UNIT/ML SOPN Inject 52 Units into the skin daily. Toujeo 09/16/17   [name], Evie Lacks, MD  Insulin Pen Needle 31G X 5 MM MISC 1 Units by Does not apply route 4 (four) times daily. 03/17/16   [name], Evie Lacks, MD  Lancets [facility] ULTRASOFT) lancets Use to check blood sugars daily 05/18/14   [name], Evie Lacks, MD  levothyroxine (SYNTHROID, LEVOTHROID) 150 MCG tablet Take 150 mcg by mouth daily before breakfast.    [provider]  levothyroxine (SYNTHROID, LEVOTHROID) 150 MCG tablet Take 1 tablet by mouth daily before breakfast 11/08/17   [name], Evie Lacks, MD  metoprolol tartrate (LOPRESSOR) 25 MG tablet Take 3 tablets (75 mg total) by mouth 2 times daily. 11/09/17   Josue Hector, MD  nitroGLYCERIN (NITROSTAT) 0.4 MG SL tablet Place 1  tablet under tongue for chest pain. Repeat every 5 minutes as needed, up to 3 doses. If no relief after 3 doses, call 911. 11/09/17   Josue Hector, MD  omeprazole (PRILOSEC) 40 MG capsule Take 1 capsule by mouth daily 11/08/17   [name], Evie Lacks, MD  potassium chloride SA (K-DUR,KLOR-CON) 20 MEQ tablet Take 1 tablet by mouth daily 11/08/17   [name], Evie Lacks, MD    Family History Family History  Problem Relation Age of Onset  . Heart attack Father   . Lung cancer Father   .  Diabetes Father   . Stroke Father   . Hypertension Father   . Stroke Mother   . Hypertension Mother   . Cancer Brother        liver  . Heart disease Brother        chf  . COPD Sister   . Malignant hyperthermia Neg Hx     Social History Social History   Tobacco Use  . Smoking status: Former Smoker    Packs/day: 0.70    Years: 48.00    Pack years: 33.60    Types: Cigarettes    Last attempt to quit: 03/29/2006    Years since quitting: 11.7  . Smokeless tobacco: Never Used  Substance Use Topics  . Alcohol use: Yes    Alcohol/week: 0.6 oz    Types: 1 Cans of beer per week    Comment: occasional alcohol intake  . Drug use: No     Allergies   Percocet [oxycodone-acetaminophen]; Metformin and related; and Invokana [canagliflozin]   Review of Systems Review of Systems  Respiratory: Positive for shortness of breath.    All other systems reviewed and are negative except that which was mentioned in HPI   Physical Exam Updated Vital Signs BP (!) 164/67   Pulse (!) 52   Temp (!) 97.4 F (36.3 C) (Oral)   Resp 13   SpO2 99%   Physical Exam  Constitutional: He is oriented to person, place, and time. He appears well-developed and well-nourished. No distress.  HENT:  Head: Normocephalic and atraumatic.  Moist mucous membranes  Eyes: Pupils are equal, round, and reactive to light. Conjunctivae are normal.  Neck: Neck supple.  Cardiovascular: Regular rhythm and normal heart sounds.  Bradycardia present.  No murmur heard. Pulmonary/Chest: Effort normal and breath sounds normal.  Abdominal: Soft. Bowel sounds are normal. He exhibits no distension. There is no tenderness.  Musculoskeletal:       Right lower leg: He exhibits edema.       Left lower leg: He exhibits edema.  Trace BLE edema of feet  Neurological: He is alert and oriented to person, place, and time.  Fluent speech 5/5 strength x all 4 ext  Skin: Skin is warm and dry.  Psychiatric: He has a normal mood and affect. Judgment normal.  Nursing note and vitals reviewed.    ED Treatments / Results  Labs (all labs ordered are listed, but only abnormal results are displayed) Labs Reviewed  COMPREHENSIVE METABOLIC PANEL - Abnormal; Notable for the following components:      Result Value   Potassium 5.5 (*)    Glucose, Bld 355 (*)    BUN 33 (*)    Creatinine, Ser 2.27 (*)    Calcium 8.7 (*)    Total Protein 6.3 (*)    ALT 16 (*)    Total Bilirubin 1.4 (*)    GFR calc non Af Amer 27 (*)    GFR calc Af Amer 31 (*)    All other components within normal limits  CBC - Abnormal; Notable for the following components:   RBC 3.64 (*)    Hemoglobin 11.1 (*)    HCT 33.0 (*)    RDW 16.5 (*)    All other components within normal limits  BRAIN NATRIURETIC PEPTIDE - Abnormal; Notable for the following components:   B Natriuretic Peptide 333.4 (*)    All other components within normal limits  CBG MONITORING, ED - Abnormal; Notable for the following components:   Glucose-Capillary 199 (*)  All other components within normal limits  D-DIMER, QUANTITATIVE (NOT AT [facility])  I-STAT TROPONIN, ED    EKG EKG Interpretation  Date/Time:  Tuesday December 28 2017 11:58:50 EDT Ventricular Rate:  49 PR Interval:  192 QRS Duration: 106 QT Interval:  490 QTC Calculation: 442 R Axis:   63 Text Interpretation:  Sinus bradycardia Incomplete left bundle branch block ST & T wave abnormality, consider inferior ischemia Abnormal  ECG since previous tracing, converted from A flutter to sinus bradycardia Confirmed by Theotis Burrow [id number]) on 12/28/2017 3:57:10 PM   Radiology Dg Chest 2 View  Result Date: 12/28/2017 CLINICAL DATA:  Shortness of breath and fatigue over the last week. History of blood transfusions. EXAM: CHEST - 2 VIEW COMPARISON:  Chest x-ray dated 06/24/2017. FINDINGS: Stable cardiomegaly. Stable small LEFT pleural effusion. Stable central pulmonary vascular congestion, presumably chronic. No confluent opacity to suggest a developing pneumonia. No pneumothorax seen. No acute or suspicious osseous finding. IMPRESSION: No acute findings.  Stable small LEFT pleural effusion. Electronically Signed   By: Franki Cabot M.D.   On: 12/28/2017 12:38    Procedures Procedures (including critical care time)  Medications Ordered in ED Medications - No data to display   Initial Impression / Assessment and Plan / ED Course  I have reviewed the triage vital signs and the nursing notes.  Pertinent labs & imaging results that were available during my care of the patient were reviewed by me and considered in my medical decision making (see chart for details).    Pt well appearing and comfortable on exam w/ normal O2 sats, mild hypertension.  EKG shows sinus bradycardia.  Lab work notable for glucose 355, BUN 33, creatinine 2.27 which is slightly worse than baseline CKD, normal anion gap, hemolyzed potassium of 5.5 with no EKG changes to suggest hyperkalemia, hemoglobin stable at 11.1.  Chest x-ray stable from previous which redemonstrates small left pleural effusion. Trop negative and d-dimer also negative making PE unlikely. I have had a long discussion regarding his symptoms and his SOB and dizziness seem to be more positional, especially when he bends over, rather than exertional. He denies any sort of chest tightness or pain at rest or with exertion to suggest unstable angina. I have recommended he contact his cardiologist  for f/u as well as PCP. I have extensively reviewed return precautions with him and he has voiced understanding.  Final Clinical Impressions(s) / ED Diagnoses   Final diagnoses:  Dizziness  SOB (shortness of breath)    ED Discharge Orders    None       Kearstin Learn, Wenda Overland, MD 12/29/17 1437

## 2017-12-28 NOTE — ED Notes (Signed)
ED Provider at bedside. 

## 2017-12-31 ENCOUNTER — Telehealth: Payer: Self-pay | Admitting: Cardiovascular Disease

## 2017-12-31 NOTE — Telephone Encounter (Signed)
Pt's wife calling to verify dosage on Metoprolol , his bottle states to take 3 25mg  tabs twice a day, total of 150mg , med list confirms this, wife want to make sure this is right. Also is concerned that his heart rate is lo around the 40's-pls advise [phone]

## 2017-12-31 NOTE — Telephone Encounter (Signed)
I spoke with pt's spouse. She states pt is continuing to c/o dizziness, lightheadedness, and sob. Pt was seen in the ER on 12/28/17. No changes in medications. He is taking metoprolol 75 mg BID and amiodarone 200 mg once a day. She states pt only took metoprolol 50 mg this morning because she felt the symptoms due to his medication. Pt BP was 167/76 HR 51. Pt HR normally runs low. He is scheduled for hosptial; f/u on 02/03/18 with Cecilie Kicks, NP. I informed her to have pt take his meds as prescribed, take a few minutes to allow BP to adjust with changing positions and I will forward to Dr. Johnsie Cancel and his nurse for any additional recommendations. She stated understanding and thankful for the call

## 2018-01-05 NOTE — Telephone Encounter (Signed)
I think plan sounds reasonable.  Follow-up as scheduled. Candee Furbish, MD

## 2018-01-05 NOTE — Telephone Encounter (Signed)
Informed patients wife of recommendations.

## 2018-01-14 ENCOUNTER — Other Ambulatory Visit: Payer: Self-pay | Admitting: Internal Medicine

## 2018-01-14 DIAGNOSIS — I6523 Occlusion and stenosis of bilateral carotid arteries: Secondary | ICD-10-CM

## 2018-01-21 ENCOUNTER — Ambulatory Visit (HOSPITAL_COMMUNITY)
Admission: RE | Admit: 2018-01-21 | Discharge: 2018-01-21 | Disposition: A | Discharge status: Home / Self Care | Payer: Medicare Other | Source: Ambulatory Visit | Attending: Cardiovascular Disease | Admitting: Cardiovascular Disease

## 2018-01-21 DIAGNOSIS — I6523 Occlusion and stenosis of bilateral carotid arteries: Secondary | ICD-10-CM | POA: Diagnosis not present

## 2018-01-23 ENCOUNTER — Encounter (INDEPENDENT_AMBULATORY_CARE_PROVIDER_SITE_OTHER): Payer: Self-pay

## 2018-02-03 ENCOUNTER — Encounter

## 2018-02-03 ENCOUNTER — Encounter: Payer: Self-pay | Admitting: Cardiology

## 2018-02-03 ENCOUNTER — Ambulatory Visit (INDEPENDENT_AMBULATORY_CARE_PROVIDER_SITE_OTHER): Payer: Medicare Other | Admitting: Cardiology

## 2018-02-03 VITALS — BP 124/62 | HR 57 | Ht 71.0 in | Wt 256.0 lb

## 2018-02-03 DIAGNOSIS — I6523 Occlusion and stenosis of bilateral carotid arteries: Secondary | ICD-10-CM | POA: Diagnosis not present

## 2018-02-03 DIAGNOSIS — R0602 Shortness of breath: Secondary | ICD-10-CM | POA: Diagnosis not present

## 2018-02-03 DIAGNOSIS — I5032 Chronic diastolic (congestive) heart failure: Secondary | ICD-10-CM | POA: Diagnosis not present

## 2018-02-03 DIAGNOSIS — I2510 Atherosclerotic heart disease of native coronary artery without angina pectoris: Secondary | ICD-10-CM | POA: Diagnosis not present

## 2018-02-03 DIAGNOSIS — I480 Paroxysmal atrial fibrillation: Secondary | ICD-10-CM

## 2018-02-03 DIAGNOSIS — I771 Stricture of artery: Secondary | ICD-10-CM | POA: Diagnosis not present

## 2018-02-03 DIAGNOSIS — E1151 Type 2 diabetes mellitus with diabetic peripheral angiopathy without gangrene: Secondary | ICD-10-CM

## 2018-02-03 DIAGNOSIS — I350 Nonrheumatic aortic (valve) stenosis: Secondary | ICD-10-CM | POA: Diagnosis not present

## 2018-02-03 DIAGNOSIS — I495 Sick sinus syndrome: Secondary | ICD-10-CM

## 2018-02-03 NOTE — Patient Instructions (Addendum)
Your physician has recommended you make the following change in your medication:  DECREASE LOPRESSOR TO 29 MG TWICE DAILY  START BACK ON INSULIN 20 UNITS Your physician recommends that you return for lab work in: [place] has recommended that you wear a holter monitor. Holter monitors are medical devices that record the heart's electrical activity. Doctors most often use these monitors to diagnose arrhythmias. Arrhythmias are problems with the speed or rhythm of the heartbeat. The monitor is a small, portable device. You can wear one while you do your normal daily activities. This is usually used to diagnose what is causing palpitations/syncope (passing out).  [place] has requested that you have an echocardiogram. Echocardiography is a painless test that uses sound waves to create images of your heart. It provides your doctor with information about the size and shape of your heart and how well your heart's chambers and valves are working. This procedure takes approximately one hour. There are no restrictions for this procedure.  Your physician recommends that you schedule a follow-up appointment in: [place] NP

## 2018-02-03 NOTE — Progress Notes (Signed)
Cardiology Office Note   Date:  02/03/2018   ID:  Michael [name], DOB [birth year], MRN [id number]  PCP:  Cassandria Anger, MD  Cardiologist:  Dr. Johnsie Cancel    Chief Complaint  Patient presents with  . Shortness of Breath      History of Present Illness: Michael [name] is a 77 y.o. male who presents for post hospitalization -ER for SOB and fatigue 12/28/17 .  No chest pain and only SOB Labs K+ 5.5, glucose 355, Cr 2.27 up from 1.88 and 2.06  Hgb 11.1, HCT 33, WBC 4.4, plts 154. BNP of 333  Troponin 0.02 DDImer 0.35  CXR with stable small Lt pleural effusion.  No acute findings. EKG SB at 49 incomplete LBBB previously in the mid 86s.   He has a hx of  CAD, carotid artery disease, paroxysmal AF, HTN, HL, DM, prior GI bleed (small bowel AVM).   He was admitted  in 4/16 with AF with RVR c/b congestive heart failure.  He was placed on Amiodarone and converted to NSR.  He had elevated Troponin levels again and a LHC demonstrated mild to mod CAD in the Dx, LCx, OM.  There was also innominate artery stenosis of borderline significance.  A follow up CTA demonstrated 50% R subclavian artery stenosis.  He saw Dr. Kathlyn Sacramento in 11/16 and his R arm symptoms were felt to mainly related to psoriatic arthritis and no vascular intervention was felt to be necessary.  First seen by me May 2017    Admitted 1/1-08/04/16  with a non-STEMI. Cardiac catheterization demonstrated 80% hazy stenosis involving the proximal OM1 felt to be the culprit lesion. This was treated with a Synergy DES. There was mild to moderate nonobstructive disease in the diagonal, LAD, AV groove circumflex and RCA. He had mildly elevated left and right heart filling pressures as well as mild pulmonary hypertension. Triple therapy was recommended with aspirin, clopidogrel and apixaban 1 month. After 30 days, aspirin could be discontinued. His creatinine increased after cardiac catheterization. Echocardiogram demonstrated normal ejection  fraction, moderate diastolic dysfunction, moderate aortic stenosis and moderate pulmonary hypertension. His hospital course complicated by left wrist cellulitis with MSSA bacteremia requiring IV antibiotics as well as diarrhea with suspected C. difficile (toxin was negative).  Readmitted 1/15-1/21/18  with acute on chronic diastolic CHF in the setting of worsening (heme+) anemia.  He required transfusion with PRBCs x 1.  His Eliquis was held. He developed AKI with diuresis. Capsule endoscopy by gastroenterology demonstrated multiple small AVMs in the mid small bowel which was the likely source of his anemia. He was continued on dual antiplatelet therapy given recent PCI with DES the setting of ACS. Eliquis was held at discharge.  Discharge creatinine 1.75. Discharge Hgb 8.9. Discharge weight 249 (-26 pounds).  Admitted again 06/26/17 with  Rapid PAF.  DAT was to finish 07/2017 was d/c on low dose amiodarone 200 mg cardizem 180 mg LA Lopressor 75 mg bid was seen by Dr Lovena Le in hospital   CHADS2-VASc=5 (CHF, vascular dz, HTN, DM, > 67 yo).  Baseline CR 2.1 with GFR 30   Myovue 06/26/17 reviewed with inferior / inferior lateral infarct EF 46% Echo: 03/30/17 EF 55-60% moderate AS mean gradient 25 mmHg    Today he tells me he is better because he stopped taking his insulin.  He had decreased it but still had symptoms.  His wife did take his CBG with dizziness and it was elevated.  But with ongoing symptoms  he stopped the insulin.  His SOB has improved and the dizziness is resolved off insulin.  His HR is improved today as well.  No chest pain.  Still has some SOB when he is working in the yard.    Past Medical History:  Diagnosis Date  . Acute on chronic heart failure ([place])   . Allergy   . Arthritis   . Atrial fibrillation with RVR ([place]) 06/25/2017   hx/notes 06/25/2017  . AVM (arteriovenous malformation) of colon   . C. difficile enteritis   . CAD (coronary artery disease)    a. Cath 10/2014 -  Mild to moderate diagonal, circumflex and obtuse marginal disease. Innominate artery sternosis.  . Carotid artery disease ([place])    a. Carotid dopple 03/2014 99-37% RICA &  16-96% LICA stenosis b. 7/89  . CKD (chronic kidney disease), stage III ([place])   . Clostridium difficile colitis 05/13/2016  . Dyspnea   . Dysrhythmia    ATRIAL FIBRILATION  . Elevated troponin    a. 09/2014 in setting of AF RVR-->Myoview: EF 49%, no ischemia/infarct->Med Rx.  . GERD (gastroesophageal reflux disease)   . Gout   . H/O transfusion of whole blood   . Heart murmur   . History of PFTs    PFTs 6/16:  FVC 3.73 (87%), FEV1 2.93 (88%), FEV1/FVC 78%, DLCO 69%  . Hx of adenomatous colonic polyps 07/02/2016  . Hyperlipidemia   . Hypertension   . Hypothyroidism   . Iron deficiency anemia   . NSTEMI (non-ST elevated myocardial infarction) ([place])    hx/notes 06/25/2017  . PAF (paroxysmal atrial fibrillation) ([place])    a. 09/2014: Converted on Dilt;  b. CHA2DS2VASc = 3-->eliquis;  c. 09/2014 Echo: EF 55-60%, mild LVH, mildly dil LA.  [name] Personal history of colonic polyps 2007, 2008   adenoma each time, largest 12 mm in 2007  . Psoriasis   . Staph infection 07/2016   left hand   . Staphylococcus aureus bacteremia 07/31/2016  . Type II diabetes mellitus ([place])    TYPE 2    Past Surgical History:  Procedure Laterality Date  . APPENDECTOMY  02/09/2014  . CARDIAC CATHETERIZATION N/A 07/29/2016   Procedure: Right/Left Heart Cath and Coronary Angiography;  Surgeon: Nelva Bush, MD;  Location: [place];  Service: Cardiovascular;  Laterality: N/A;  . CARDIAC CATHETERIZATION N/A 07/29/2016   Procedure: Coronary Stent Intervention;  Surgeon: Nelva Bush, MD;  Location: [place] [facility];  Service: Cardiovascular;  Laterality: N/A;  . CARDIAC CATHETERIZATION  10/2014  . COLONOSCOPY  02/10/2011   internal hemorrhoids  . COLONOSCOPY W/ POLYPECTOMY  04/09/2006   12 mm adenoma  . COLONOSCOPY W/ POLYPECTOMY   06/17/2007   5 mm adenoma  . COLONOSCOPY WITH PROPOFOL N/A 05/13/2016   Procedure: COLONOSCOPY WITH PROPOFOL;  Surgeon: Manus Gunning, MD;  Location: [facility];  Service: Gastroenterology;  Laterality: N/A;  . ENTEROSCOPY N/A 08/14/2016   Procedure: ENTEROSCOPY;  Surgeon: Manus Gunning, MD;  Location: [facility] ENDOSCOPY;  Service: Gastroenterology;  Laterality: N/A;  . ESOPHAGOGASTRODUODENOSCOPY  12/23/2011   Procedure: ESOPHAGOGASTRODUODENOSCOPY (EGD);  Surgeon: Irene Shipper, MD;  Location: [facility];  Service: Endoscopy;  Laterality: N/A;  with small bowel bx's  . GIVENS CAPSULE STUDY  12/28/2011  . GIVENS CAPSULE STUDY N/A 08/14/2016   Procedure: GIVENS CAPSULE STUDY;  Surgeon: Manus Gunning, MD;  Location: [facility];  Service: Gastroenterology;  Laterality: N/A;  . KNEE ARTHROSCOPY Right   . LAPAROSCOPIC APPENDECTOMY  N/A 02/09/2014   Procedure: APPENDECTOMY LAPAROSCOPIC;  Surgeon: Leighton Ruff, MD;  Location: [facility];  Service: General;  Laterality: N/A;  . LEFT HEART CATHETERIZATION WITH CORONARY ANGIOGRAM N/A 11/15/2014   Procedure: LEFT HEART CATHETERIZATION WITH CORONARY ANGIOGRAM;  Surgeon: Belva Crome, MD;  Location: [facility] CATH LAB;  Service: Cardiovascular;  Laterality: N/A;  . SHOULDER OPEN ROTATOR CUFF REPAIR Left      Current Outpatient Medications  Medication Sig Dispense Refill  . allopurinol (ZYLOPRIM) 100 MG tablet Take 2 tablets (221m total) by mouth daily 180 tablet 1  . amiodarone (PACERONE) 200 MG tablet Take 1 tablet by mouth every day 90 tablet 0  . aspirin 81 MG chewable tablet Chew 1 tablet (81 mg total) by mouth daily. 90 tablet 1  . atorvastatin (LIPITOR) 40 MG tablet Take 1 tablet by mouth daily 90 tablet 1  . b complex vitamins tablet Take 1 tablet by mouth daily. 100 tablet 3  . Blood Glucose Monitoring Suppl (ONE TOUCH ULTRA 2) W/DEVICE KIT Use as directed Dx E11.9 1 each 0  . Blood Pressure Monitor KIT Use to check blood pressure  daily Dx I10 1 each 0  . Cholecalciferol (VITAMIN D-3) 1000 units CAPS Take 1,000-5,000 Units by mouth See admin instructions. 1,000 units once a day on Sun/Mon/Tues/Wed/Thurs/Fri and 5,000 units on Sat    . diltiazem (CARTIA XT) 180 MG 24 hr capsule Take 1 capsule (180 mg total) by mouth daily. 90 capsule 3  . fenofibrate 160 MG tablet Take 160 mg by mouth daily.    . ferrous sulfate 325 (65 FE) MG tablet Take 325 mg by mouth 3 (three) times daily with meals.    . furosemide (LASIX) 40 MG tablet Take 80 mg by mouth 2 (two) times daily. 2 tablets po bid.    .Marland Kitchengabapentin (NEURONTIN) 100 MG capsule Take 1 capsule by mouth twice a day 180 capsule 1  . glipiZIDE (GLUCOTROL) 10 MG tablet Take 1 tablet by mouth twice a day before meals 180 tablet 1  . glucose blood (ONE TOUCH ULTRA TEST) test strip 1 each by Other route 4 (four) times daily. Use to check blood sugar four times a day  Dx: E11.9- insulin Dependant 360 each 1  . hydrALAZINE (APRESOLINE) 25 MG tablet Take 1 tablet by mouth 3 times a day 270 tablet 1  . HYDROcodone-acetaminophen (NORCO/VICODIN) 5-325 MG tablet Take 1 tablet by mouth 2 (two) times daily as needed for moderate pain. 60 tablet 0  . Insulin Glargine (TOUJEO SOLOSTAR) 300 UNIT/ML SOPN Inject 52 Units into the skin daily. Toujeo 300 mL 12  . Insulin Pen Needle 31G X 5 MM MISC 1 Units by Does not apply route 4 (four) times daily. 150 each 11  . Lancets (ONETOUCH ULTRASOFT) lancets Use to check blood sugars daily 30 each 11  . levothyroxine (SYNTHROID, LEVOTHROID) 150 MCG tablet Take 1 tablet by mouth daily before breakfast 90 tablet 0  . metoprolol tartrate (LOPRESSOR) 25 MG tablet Take 3 tablets (75 mg total) by mouth 2 times daily. 540 tablet 0  . nitroGLYCERIN (NITROSTAT) 0.4 MG SL tablet Place 1 tablet under tongue for chest pain. Repeat every 5 minutes as needed, up to 3 doses. If no relief after 3 doses, call 911. 100 tablet 0  . omeprazole (PRILOSEC) 40 MG capsule Take 1  capsule by mouth daily 90 capsule 0  . potassium chloride SA (K-DUR,KLOR-CON) 20 MEQ tablet Take 1 tablet by mouth daily 90 tablet  0   No current facility-administered medications for this visit.     Allergies:   Percocet [oxycodone-acetaminophen]; Metformin and related; and Invokana [canagliflozin]    Social History:  The patient  reports that he quit smoking about 11 years ago. His smoking use included cigarettes. He has a 33.60 pack-year smoking history. He has never used smokeless tobacco. He reports that he drinks about 0.6 oz of alcohol per week. He reports that he does not use drugs.   Family History:  The patient's family history includes COPD in his sister; Cancer in his brother; Diabetes in his father; Heart attack in his father; Heart disease in his brother; Hypertension in his father and mother; Lung cancer in his father; Stroke in his father and mother.    ROS:  General:no colds or fevers, + weight gain Skin:no rashes or ulcers HEENT:no blurred vision, no congestion CV:see HPI PUL:see HPI GI:no diarrhea constipation or melena, no indigestion GU:no hematuria, no dysuria MS:no joint pain, no claudication Neuro:no syncope, + lightheadedness Endo:+ diabetes, + thyroid disease  Wt Readings from Last 3 Encounters:  02/03/18 256 lb (116.1 kg)  10/27/17 255 lb (115.7 kg)  09/06/17 248 lb (112.5 kg)     PHYSICAL EXAM: VS:  BP 124/62   Pulse (!) 57   Ht _0  (1.803 m)   Wt 256 lb (116.1 kg)   SpO2 96%   BMI 35.70 kg/m  , BMI Body mass index is 35.7 kg/m. General:Pleasant affect, NAD Skin:Warm and dry, brisk capillary refill HEENT:normocephalic, sclera clear, mucus membranes moist Neck:supple, no JVD, no bruits  Heart:S1S2 RRR with 3/6 systolic murmur, no gallup, rub or click Lungs:clear without rales, rhonchi, or wheezes FHQ:RFXJ, non tender, + BS, do not palpate liver spleen or masses Ext:+ tr  lower ext edema, 2+ pedal pulses, 2+ radial pulses Neuro:alert and  oriented, MAE, follows commands, + facial symmetry    EKG:  EKG is ordered today. The ekg ordered today demonstrates Sinus brady with ST and T wave abnormality but no acute changes.    Recent Labs: 10/27/2017: TSH 3.89 12/28/2017: ALT 16; B Natriuretic Peptide 333.4; BUN 33; Creatinine, Ser 2.27; Hemoglobin 11.1; Platelets 154; Potassium 5.5; Sodium 138    Lipid Panel    Component Value Date/Time   CHOL 101 08/01/2016 0440   TRIG 344 (H) 08/01/2016 0440   HDL 22 (L) 08/01/2016 0440   CHOLHDL 4.6 08/01/2016 0440   VLDL 69 (H) 08/01/2016 0440   LDLCALC 10 08/01/2016 0440   LDLDIRECT 26.0 03/20/2015 1003       Other studies Reviewed: Additional studies/ records that were reviewed today include: . Carotid US 01/21/18 Final Interpretation: Right Carotid: Velocities in the right ICA are consistent with a 40-59%        stenosis. (based on PSV and plaque).  Left Carotid: Velocities in the left ICA are consistent with a 40-59% stenosis.  Vertebrals: Left vertebral artery demonstrates antegrade flow. Abnormal right       vertebral artery flow with mild right subclavian steal. Subclavians: Right subclavian artery was stenotic. Normal flow hemodynamics were       seen in the left subclavian artery.  *See table  [facility] 07/29/16 LAD proximal 40, mid 10, D1 60 LCx proximal 30, distal 30, OM1 80 RA mean 9, RV 43/11, PA 44/12, mean 26; PCWP 18 AV mean gradient 12, calculated AVA 1.78 cm PCI: 2.25 x 38 mm Synergy DES to the OM1  Echo 08/01/16 Severe LVH, EF 60-65, normal wall  motion, grade 2 diastolic dysfunction, moderate aortic stenosis (mean 20, peak 37), MAC, mild LAE, PASP 53  Echo 12/17 Moderate concentric LVH, EF 60-65, normal wall motion, grade 1 diastolic dysfunction, moderate aortic stenosis (mean 26), mild LAE, mild TR, PASP 52  Carotid US 5/17 Bilateral ICA 40-59, mild right subclavian steal Follow-up 1 year  LHC 4/16 LM: widely patent. LAD:  widely patent; D1 50-70%  LCx: Patent. OM2 proximal eccentric 50-70%; mid CFX after origin of OM2 50% RI: widely patent. RCA: widely patent.. INNOMINATE ANGIOGRAPHY:There is heavy calcification with at least 50% obstruction in the innominate artery LV gram EF 65%..  Echo 3/16 Mild LVH, EF 55-60, mild LAE  Myoview 3/16 No ischemia or scar, EF 49.  Intermediate Risk    ASSESSMENT AND PLAN:  1.  SOB and dizziness, may have been chronotropic incompetence so will decrease BB to 50 mg BID.  With AS will check Echo and if stable then will do POET, to eval for chronotropic incompetence.  Will have him wear 48 hour holter as well for now.    2.  AS -moderate in 03/2017, will check echo.  Symptoms could be related to increase of AS.  3.  Persistent a fib maintaining SR though at times he has rapid HR may happen every couple of months.  Was taken off anticoagulation due to hx of GI bleed and AVMs.  Continue lopressor doen to 50 BID from 75 BID, on dilt 180 mg CD daily and amiodarone 200 mg daily.   4.  CAD with hx NSTEMI and DES to OM1 07/2016.  Mild to moderate residual CAD.  No longer on plavix, is on ASA 81 mg daily.  myoview 06/2017 was low risk - no chest pai or pressure.  5.  Chronic diastolic HF, euvolemic. On lasix 40 BID  6.  HTN controlled  7.   HLD on statin, continue   8.   DM on insulin which he has stopped, have asked to resume 20 units per day and notify Dr. Alain Marion.   9.   CKD baseline 2.0  10.  Rt subclavian stenosis followed by Dr. Fletcher Anon.   Current medicines are reviewed with the patient today.  The patient Has no concerns regarding medicines.  The following changes have been made:  See above Labs/ tests ordered today include:see above  Disposition:   FU:  see above  Signed, Cecilie Kicks, NP  02/03/2018 2:11 PM    [facility], [place], [place], [place] Phone: [phone]; Fax: [phone]

## 2018-02-04 LAB — BASIC METABOLIC PANEL
BUN/Creatinine Ratio: 16 (ref 10–24)
BUN: 33 mg/dL — ABNORMAL HIGH (ref 8–27)
CO2: 21 mmol/L (ref 20–29)
Calcium: 8.6 mg/dL (ref 8.6–10.2)
Chloride: 101 mmol/L (ref 96–106)
Creatinine, Ser: 2.06 mg/dL — ABNORMAL HIGH (ref 0.76–1.27)
GFR calc Af Amer: 35 mL/min/{1.73_m2} — ABNORMAL LOW (ref >59)
GFR calc non Af Amer: 31 mL/min/{1.73_m2} — ABNORMAL LOW (ref >59)
GLUCOSE: 408 mg/dL — AB (ref 65–99)
POTASSIUM: 5.4 mmol/L — AB (ref 3.5–5.2)
SODIUM: 139 mmol/L (ref 134–144)

## 2018-02-10 ENCOUNTER — Other Ambulatory Visit: Payer: Self-pay

## 2018-02-10 ENCOUNTER — Ambulatory Visit (HOSPITAL_COMMUNITY): Payer: Medicare Other | Attending: Cardiovascular Disease

## 2018-02-10 ENCOUNTER — Ambulatory Visit (INDEPENDENT_AMBULATORY_CARE_PROVIDER_SITE_OTHER): Payer: Medicare Other

## 2018-02-10 DIAGNOSIS — I495 Sick sinus syndrome: Secondary | ICD-10-CM | POA: Diagnosis not present

## 2018-02-10 DIAGNOSIS — I252 Old myocardial infarction: Secondary | ICD-10-CM | POA: Diagnosis not present

## 2018-02-10 DIAGNOSIS — I509 Heart failure, unspecified: Secondary | ICD-10-CM | POA: Diagnosis not present

## 2018-02-10 DIAGNOSIS — I2510 Atherosclerotic heart disease of native coronary artery without angina pectoris: Secondary | ICD-10-CM | POA: Diagnosis not present

## 2018-02-10 DIAGNOSIS — I4891 Unspecified atrial fibrillation: Secondary | ICD-10-CM | POA: Insufficient documentation

## 2018-02-10 DIAGNOSIS — I110 Hypertensive heart disease with heart failure: Secondary | ICD-10-CM | POA: Insufficient documentation

## 2018-02-10 DIAGNOSIS — R0602 Shortness of breath: Secondary | ICD-10-CM | POA: Insufficient documentation

## 2018-02-10 DIAGNOSIS — E119 Type 2 diabetes mellitus without complications: Secondary | ICD-10-CM | POA: Diagnosis not present

## 2018-02-10 DIAGNOSIS — E785 Hyperlipidemia, unspecified: Secondary | ICD-10-CM | POA: Diagnosis not present

## 2018-02-17 ENCOUNTER — Telehealth: Payer: Self-pay

## 2018-02-17 DIAGNOSIS — R002 Palpitations: Secondary | ICD-10-CM

## 2018-02-17 DIAGNOSIS — R0602 Shortness of breath: Secondary | ICD-10-CM

## 2018-02-17 MED ORDER — METOPROLOL TARTRATE 25 MG PO TABS: 25.0000 mg | ORAL_TABLET | Freq: Two times a day (BID) | ORAL | 1 refills | Status: DC

## 2018-02-17 NOTE — Telephone Encounter (Signed)
-----   Message from Josue Hector, MD sent at 02/17/2018  8:19 AM EDT ----- HR is slow decrease beta blocker ? Is he on 75 bid cut back to 25 bid He was supposed to have ETT as well ?? Not done ??

## 2018-02-17 NOTE — Telephone Encounter (Signed)
Called patient's wife (DPR) with monitor results. Per Dr. Johnsie Cancel, HR is slow decrease beta blocker to  25 bid. Informed patient's wife that patient needs an ETT. Will order test and have scheduling call to make an appointment for the test. Informed patient's wife of instructions for GXT. Patient's wife verbalized understanding.

## 2018-02-24 ENCOUNTER — Ambulatory Visit (INDEPENDENT_AMBULATORY_CARE_PROVIDER_SITE_OTHER): Payer: Medicare Other

## 2018-02-24 ENCOUNTER — Telehealth: Payer: Self-pay

## 2018-02-24 DIAGNOSIS — R0602 Shortness of breath: Secondary | ICD-10-CM | POA: Diagnosis not present

## 2018-02-24 DIAGNOSIS — R002 Palpitations: Secondary | ICD-10-CM

## 2018-02-24 LAB — EXERCISE TOLERANCE TEST
CSEPED: 2 min
CSEPEDS: 58 s
CSEPEW: 2.3 METS
CSEPHR: 60 %
CSEPPHR: 88 {beats}/min
MPHR: 145 {beats}/min
RPE: 16
Rest HR: 50 {beats}/min

## 2018-02-24 NOTE — Telephone Encounter (Signed)
Patient aware of results of GXT. Discontinue metoprolol and updated medication list. Sent message to Dr. Tanna Furry scheduler to help make appointment for patient.

## 2018-02-24 NOTE — Telephone Encounter (Signed)
-----   Message from Josue Hector, MD sent at 02/24/2018  2:02 PM EDT ----- Peak HR only 80 have him stop beta blocker and f/u with Dr Lovena Le who has seen him before May need pacer

## 2018-02-25 ENCOUNTER — Other Ambulatory Visit (INDEPENDENT_AMBULATORY_CARE_PROVIDER_SITE_OTHER): Payer: Medicare Other

## 2018-02-25 ENCOUNTER — Ambulatory Visit (INDEPENDENT_AMBULATORY_CARE_PROVIDER_SITE_OTHER): Payer: Medicare Other | Admitting: Internal Medicine

## 2018-02-25 ENCOUNTER — Encounter: Payer: Self-pay | Admitting: Internal Medicine

## 2018-02-25 DIAGNOSIS — I10 Essential (primary) hypertension: Secondary | ICD-10-CM

## 2018-02-25 DIAGNOSIS — I4891 Unspecified atrial fibrillation: Secondary | ICD-10-CM | POA: Diagnosis not present

## 2018-02-25 DIAGNOSIS — I2510 Atherosclerotic heart disease of native coronary artery without angina pectoris: Secondary | ICD-10-CM

## 2018-02-25 DIAGNOSIS — E1151 Type 2 diabetes mellitus with diabetic peripheral angiopathy without gangrene: Secondary | ICD-10-CM

## 2018-02-25 DIAGNOSIS — I6523 Occlusion and stenosis of bilateral carotid arteries: Secondary | ICD-10-CM | POA: Diagnosis not present

## 2018-02-25 DIAGNOSIS — E1140 Type 2 diabetes mellitus with diabetic neuropathy, unspecified: Secondary | ICD-10-CM

## 2018-02-25 LAB — BASIC METABOLIC PANEL
BUN: 32 mg/dL — ABNORMAL HIGH (ref 6–23)
CHLORIDE: 97 meq/L (ref 96–112)
CO2: 26 mEq/L (ref 19–32)
Calcium: 9.1 mg/dL (ref 8.4–10.5)
Creatinine, Ser: 2.11 mg/dL — ABNORMAL HIGH (ref 0.40–1.50)
GFR: 32.67 mL/min — AB (ref >60.00)
Glucose, Bld: 351 mg/dL — ABNORMAL HIGH (ref 70–99)
POTASSIUM: 4.5 meq/L (ref 3.5–5.1)
SODIUM: 131 meq/L — AB (ref 135–145)

## 2018-02-25 LAB — HEMOGLOBIN A1C: HEMOGLOBIN A1C: 9.6 % — AB (ref 4.6–6.5)

## 2018-02-25 MED ORDER — LEVOTHYROXINE SODIUM 150 MCG PO TABS
ORAL_TABLET | ORAL | 3 refills | Status: DC
Start: 2018-02-25 — End: 2019-02-14

## 2018-02-25 MED ORDER — HYDROCODONE-ACETAMINOPHEN 5-325 MG PO TABS: 1.0000 | ORAL_TABLET | Freq: Two times a day (BID) | ORAL | 0 refills | Status: DC | PRN

## 2018-02-25 NOTE — Assessment & Plan Note (Signed)
ASA, amiodarone, Cartia XT

## 2018-02-25 NOTE — Patient Instructions (Signed)
[facility] w/Jill

## 2018-02-25 NOTE — Progress Notes (Signed)
Subjective:  Patient ID: Michael [name], male    DOB: 1941/01/26  Age: 77 y.o. MRN: [id number]  CC: No chief complaint on file.   HPI Michael [name] presents for DM, CAD, dyslipidemia, OA Beta blocker was stopped due to slow HR  Outpatient Medications Prior to Visit  Medication Sig Dispense Refill  . allopurinol (ZYLOPRIM) 100 MG tablet Take 2 tablets (298m total) by mouth daily 180 tablet 1  . amiodarone (PACERONE) 200 MG tablet Take 1 tablet by mouth every day 90 tablet 0  . aspirin 81 MG chewable tablet Chew 1 tablet (81 mg total) by mouth daily. 90 tablet 1  . atorvastatin (LIPITOR) 40 MG tablet Take 1 tablet by mouth daily 90 tablet 1  . b complex vitamins tablet Take 1 tablet by mouth daily. 100 tablet 3  . Blood Glucose Monitoring Suppl (ONE TOUCH ULTRA 2) W/DEVICE KIT Use as directed Dx E11.9 1 each 0  . Blood Pressure Monitor KIT Use to check blood pressure daily Dx I10 1 each 0  . Cholecalciferol (VITAMIN D-3) 1000 units CAPS Take 1,000-5,000 Units by mouth See admin instructions. 1,000 units once a day on Sun/Mon/Tues/Wed/Thurs/Fri and 5,000 units on Sat    . diltiazem (CARTIA XT) 180 MG 24 hr capsule Take 1 capsule (180 mg total) by mouth daily. 90 capsule 3  . fenofibrate 160 MG tablet Take 160 mg by mouth daily.    . ferrous sulfate 325 (65 FE) MG tablet Take 325 mg by mouth 3 (three) times daily with meals.    . furosemide (LASIX) 40 MG tablet Take 80 mg by mouth 2 (two) times daily. 2 tablets po bid.    .Marland Kitchengabapentin (NEURONTIN) 100 MG capsule Take 1 capsule by mouth twice a day 180 capsule 1  . glipiZIDE (GLUCOTROL) 10 MG tablet Take 1 tablet by mouth twice a day before meals 180 tablet 1  . glucose blood (ONE TOUCH ULTRA TEST) test strip 1 each by Other route 4 (four) times daily. Use to check blood sugar four times a day  Dx: E11.9- insulin Dependant 360 each 1  . hydrALAZINE (APRESOLINE) 25 MG tablet Take 1 tablet by mouth 3 times a day 270 tablet 1  .  HYDROcodone-acetaminophen (NORCO/VICODIN) 5-325 MG tablet Take 1 tablet by mouth 2 (two) times daily as needed for moderate pain. 60 tablet 0  . Insulin Glargine (TOUJEO SOLOSTAR) 300 UNIT/ML SOPN Inject 52 Units into the skin daily. Toujeo 300 mL 12  . Insulin Pen Needle 31G X 5 MM MISC 1 Units by Does not apply route 4 (four) times daily. 150 each 11  . Lancets (ONETOUCH ULTRASOFT) lancets Use to check blood sugars daily 30 each 11  . levothyroxine (SYNTHROID, LEVOTHROID) 150 MCG tablet Take 1 tablet by mouth daily before breakfast 90 tablet 0  . nitroGLYCERIN (NITROSTAT) 0.4 MG SL tablet Place 1 tablet under tongue for chest pain. Repeat every 5 minutes as needed, up to 3 doses. If no relief after 3 doses, call 911. 100 tablet 0  . omeprazole (PRILOSEC) 40 MG capsule Take 1 capsule by mouth daily 90 capsule 0  . potassium chloride SA (K-DUR,KLOR-CON) 20 MEQ tablet Take 1 tablet by mouth daily 90 tablet 0   No facility-administered medications prior to visit.     ROS: Review of Systems  Constitutional: Positive for fatigue. Negative for appetite change and unexpected weight change.  HENT: Negative for congestion, nosebleeds, sneezing, sore throat and trouble swallowing.  Eyes: Negative for itching and visual disturbance.  Respiratory: Negative for cough and wheezing.   Cardiovascular: Negative for chest pain, palpitations and leg swelling.  Gastrointestinal: Negative for abdominal distention, blood in stool, diarrhea and nausea.  Genitourinary: Negative for frequency and hematuria.  Musculoskeletal: Positive for arthralgias, back pain, gait problem, neck pain and neck stiffness. Negative for joint swelling.  Skin: Positive for color change and rash.  Neurological: Negative for dizziness, tremors, speech difficulty and weakness.  Psychiatric/Behavioral: Negative for agitation, dysphoric mood, sleep disturbance and suicidal ideas. The patient is not nervous/anxious.     Objective:  BP  132/64 (BP Location: Left Arm, Patient Position: Sitting, Cuff Size: Large)   Pulse 70   Temp 98 F (36.7 C) (Oral)   Ht 5' 11"  (1.803 m)   Wt 254 lb (115.2 kg)   SpO2 98%   BMI 35.43 kg/m   BP Readings from Last 3 Encounters:  02/25/18 132/64  02/03/18 124/62  12/28/17 (!) 164/67    Wt Readings from Last 3 Encounters:  02/25/18 254 lb (115.2 kg)  02/03/18 256 lb (116.1 kg)  10/27/17 255 lb (115.7 kg)    Physical Exam  Constitutional: He is oriented to person, place, and time. He appears well-developed. No distress.  NAD  HENT:  Mouth/Throat: Oropharynx is clear and moist.  Eyes: Pupils are equal, round, and reactive to light. Conjunctivae are normal.  Neck: Normal range of motion. No JVD present. No thyromegaly present.  Cardiovascular: Normal rate, regular rhythm, normal heart sounds and intact distal pulses. Exam reveals no gallop and no friction rub.  No murmur heard. Pulmonary/Chest: Effort normal and breath sounds normal. No respiratory distress. He has no wheezes. He has no rales. He exhibits no tenderness.  Abdominal: Soft. Bowel sounds are normal. He exhibits no distension and no mass. There is no tenderness. There is no rebound and no guarding.  Musculoskeletal: Normal range of motion. He exhibits tenderness. He exhibits no edema.  Lymphadenopathy:    He has no cervical adenopathy.  Neurological: He is alert and oriented to person, place, and time. He has normal reflexes. No cranial nerve deficit. He exhibits normal muscle tone. He displays a negative Romberg sign. Coordination abnormal. Gait normal.  Skin: Skin is warm and dry. No rash noted.  Psychiatric: He has a normal mood and affect. His behavior is normal. Judgment and thought content normal.   Purple distal LEs B OA changes - hands  Lab Results  Component Value Date   WBC 4.4 12/28/2017   HGB 11.1 (L) 12/28/2017   HCT 33.0 (L) 12/28/2017   PLT 154 12/28/2017   GLUCOSE 408 (H) 02/03/2018   CHOL 101  08/01/2016   TRIG 344 (H) 08/01/2016   HDL 22 (L) 08/01/2016   LDLDIRECT 26.0 03/20/2015   LDLCALC 10 08/01/2016   ALT 16 (L) 12/28/2017   AST 32 12/28/2017   NA 139 02/03/2018   K 5.4 (H) 02/03/2018   CL 101 02/03/2018   CREATININE 2.06 (H) 02/03/2018   BUN 33 (H) 02/03/2018   CO2 21 02/03/2018   TSH 3.89 10/27/2017   PSA 0.50 11/07/2013   INR 1.06 12/08/2016   HGBA1C 8.5 (H) 10/27/2017   MICROALBUR 1.2 05/16/2013    No results found.  Assessment & Plan:   There are no diagnoses linked to this encounter.   No orders of the defined types were placed in this encounter.    Follow-up: No follow-ups on file.  [name], MD

## 2018-02-25 NOTE — Assessment & Plan Note (Signed)
Labs

## 2018-02-25 NOTE — Assessment & Plan Note (Signed)
Cardizem, toprol - stopped 7/19, furosemide

## 2018-02-25 NOTE — Assessment & Plan Note (Signed)
Lopressor - d/c 7/19 Lipitor and ASA

## 2018-03-02 IMAGING — DX DG CHEST 1V PORT
2 series · 3 of 3 positions shown · non-contrast
Comparison: 08/03/2016

CLINICAL DATA: Evaluate central venous catheter placement

EXAM:
PORTABLE CHEST 1 VIEW

[Series 1: chest ap · 0.14mm/px · 2 of 2 slices shown (1 of 2)]
[im 1/2]
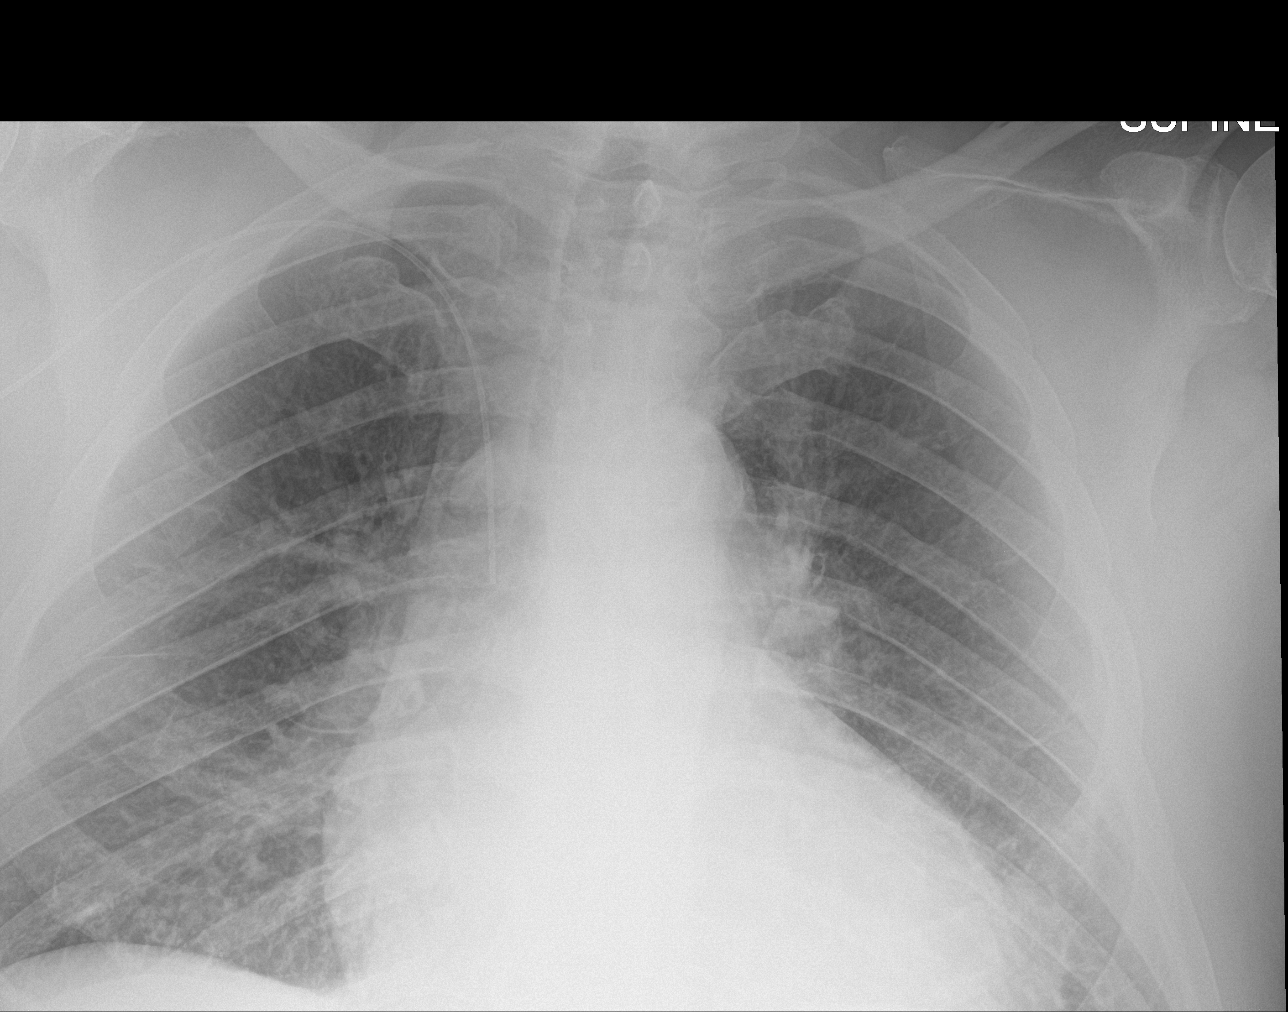
[im 2/2]
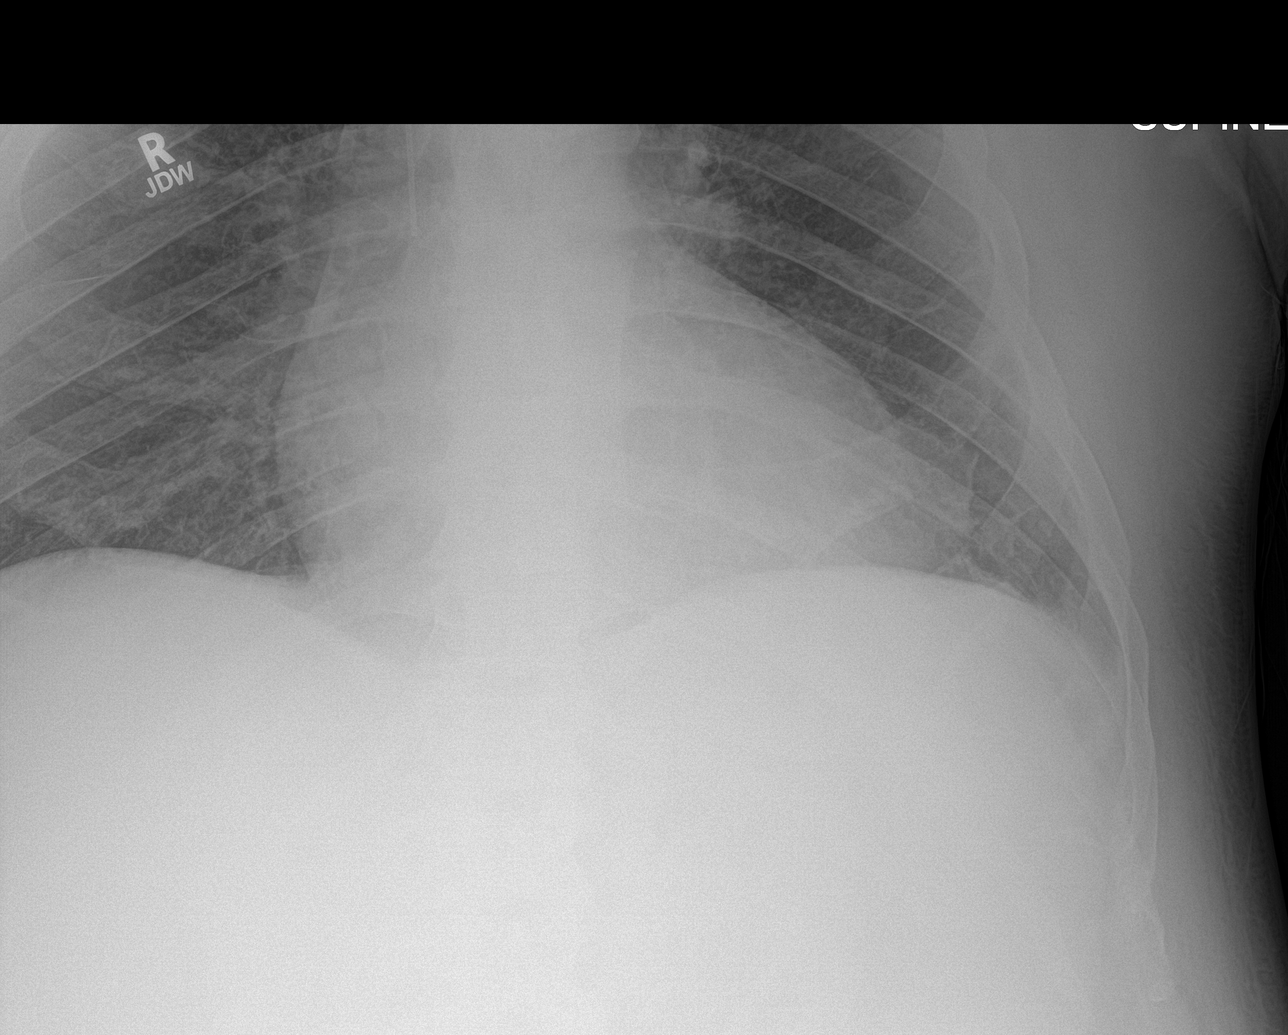

[chest ap (2 of 2)]
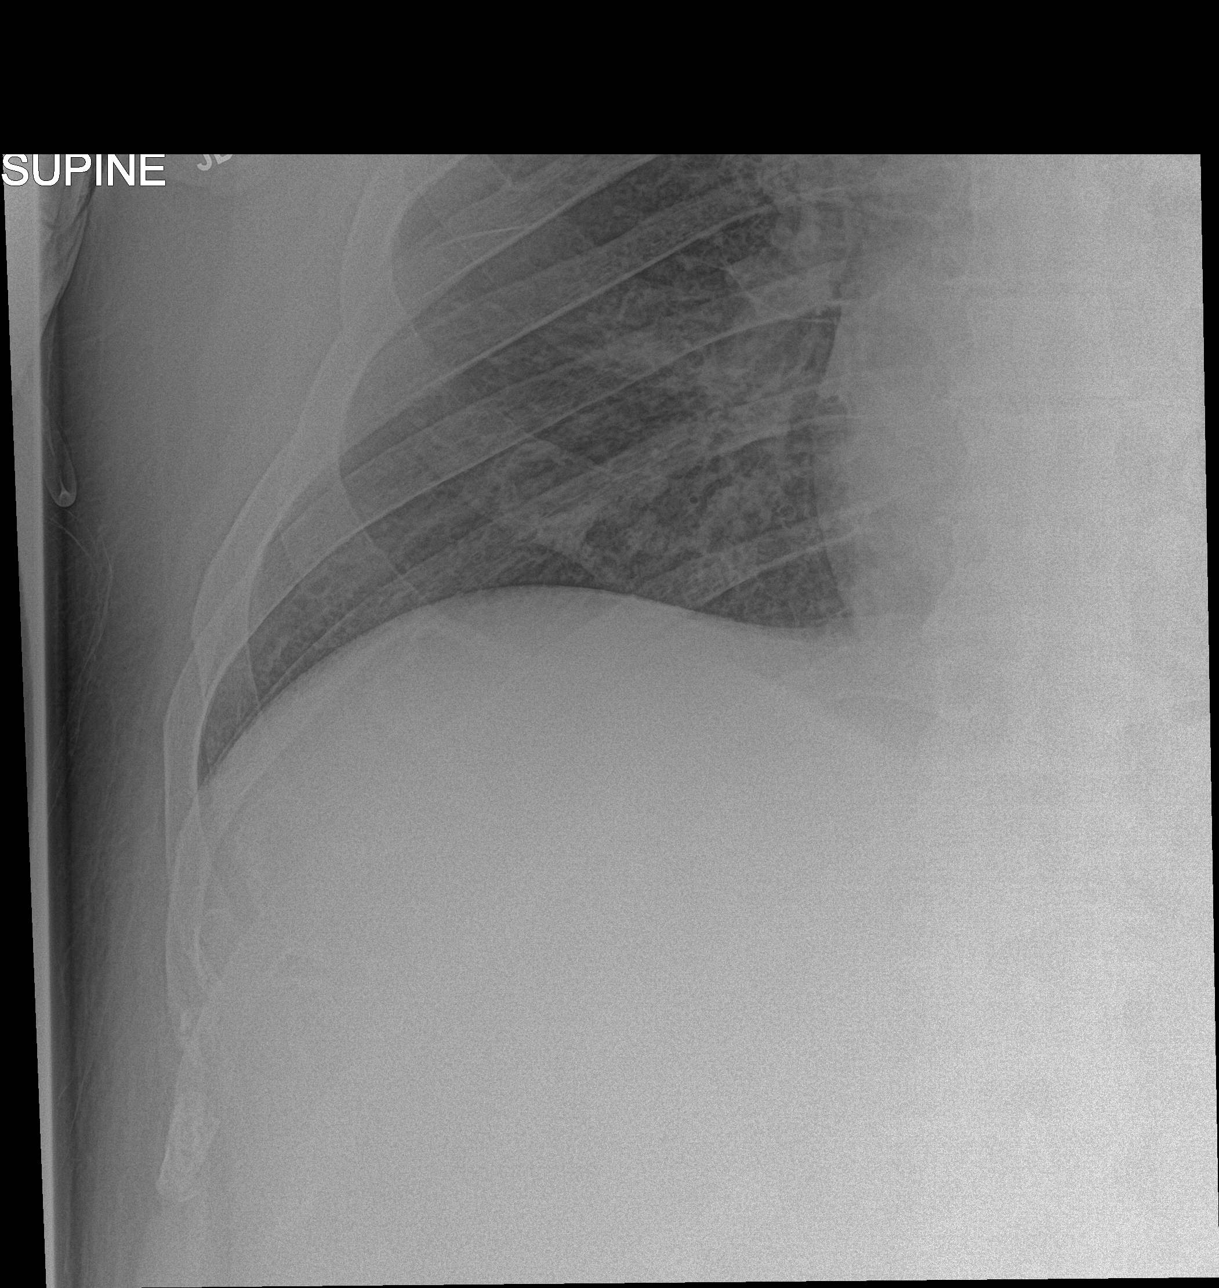

[3 of 3 positions shown; findings below may reference images not displayed]

FINDINGS: There is a right arm PICC line with tip in the projection of the
distal SVC. The heart size is moderately enlarged. No pleural
effusion or edema. No airspace opacities.
IMPRESSION: 1. Right arm PICC line tip projects over the distal SVC.

## 2018-03-02 IMAGING — CR DG CHEST 1V PORT
1 series · 1 of 1 positions shown · non-contrast
Comparison: 07/30/2016 and 07/27/2016.

CLINICAL DATA: PICC line placement.

EXAM:
PORTABLE CHEST 1 VIEW

[portable]
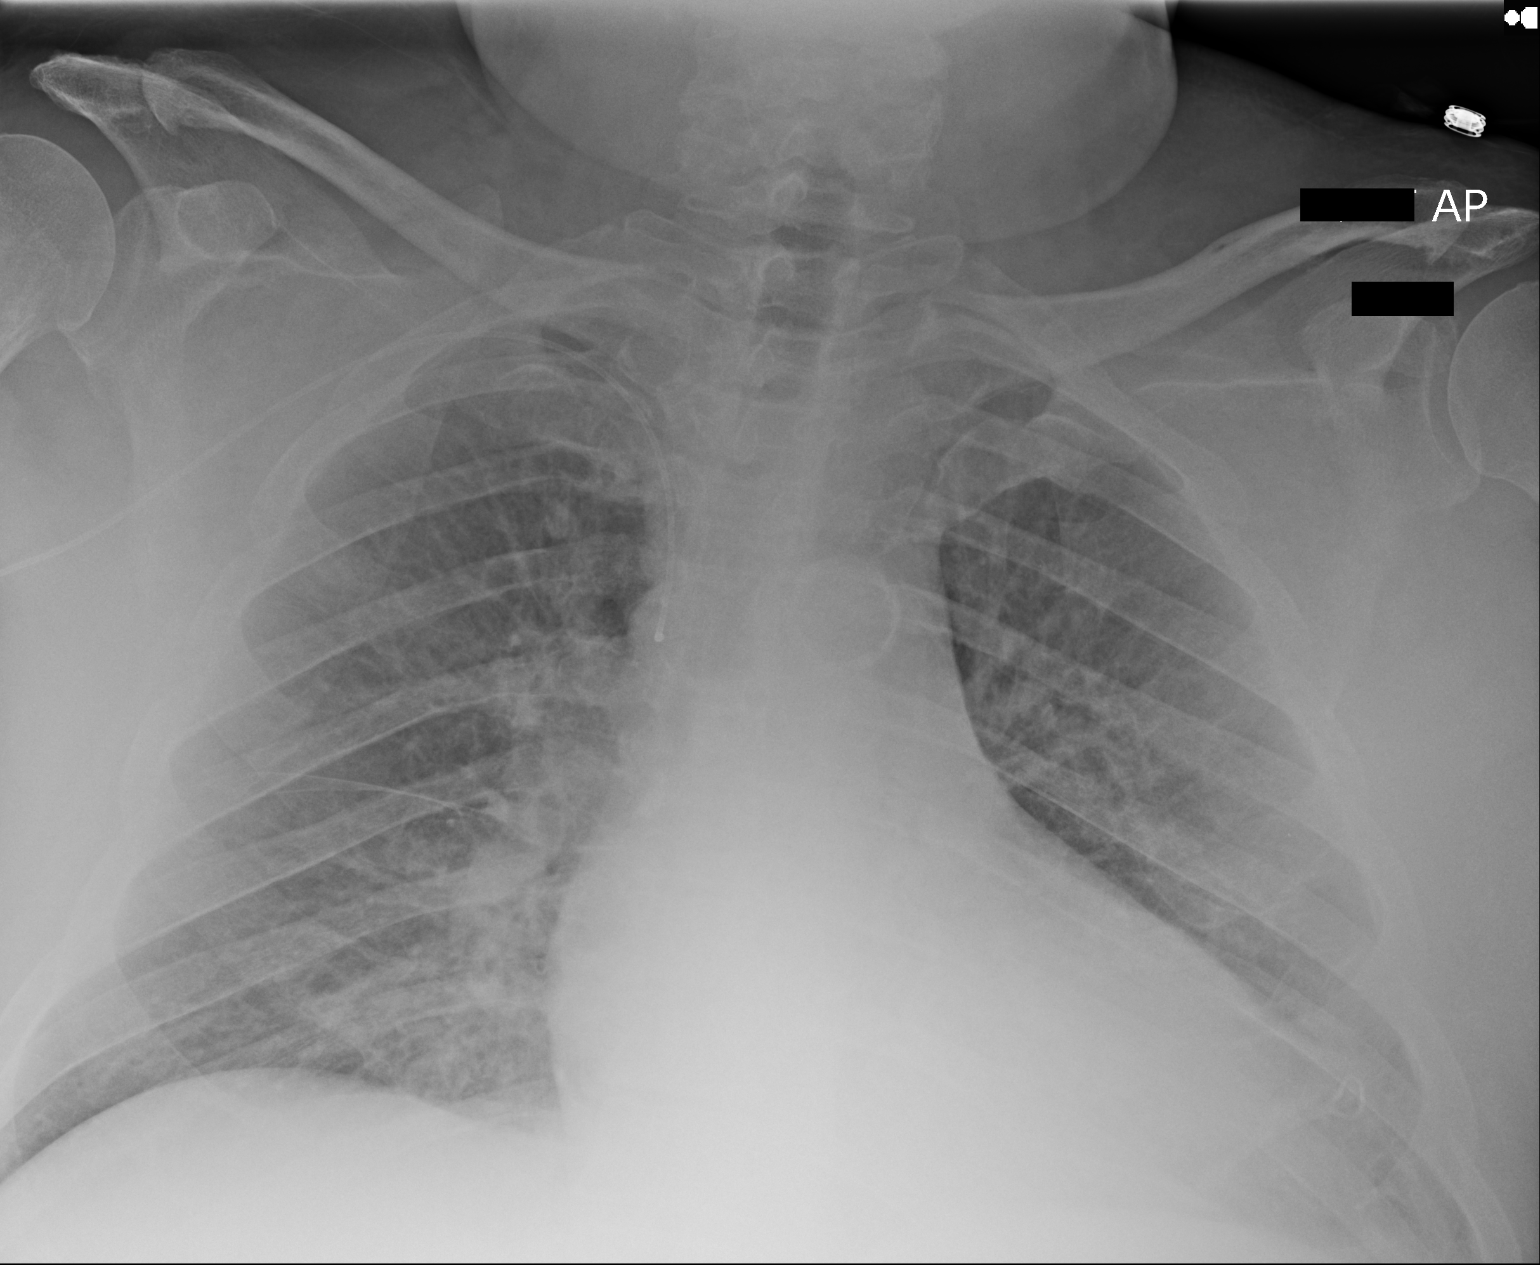

[1 of 1 positions shown; findings below may reference images not displayed]

FINDINGS: 0379 hours. Right arm PICC projects to the mid SVC level. The
catheter appears to extend into the azygos vein, suboptimally
evaluated on this single AP view. Stable cardiomegaly and aortic
atherosclerosis. There is mild atelectasis at both lung bases. No
confluent airspace opacity, pneumothorax or significant pleural
effusion.
IMPRESSION: Right arm PICC appears to extend into the azygos vein on this single
portable AP examination. Catheter repositioning prior to use and
radiographic follow up recommended.

These results will be called to the ordering clinician or
representative by the Radiologist Assistant, and communication
documented in the PACS or zVision Dashboard.

## 2018-03-09 ENCOUNTER — Other Ambulatory Visit: Payer: Self-pay | Admitting: Internal Medicine

## 2018-03-16 ENCOUNTER — Other Ambulatory Visit: Payer: Self-pay | Admitting: Internal Medicine

## 2018-03-16 ENCOUNTER — Other Ambulatory Visit: Payer: Self-pay | Admitting: Cardiovascular Disease

## 2018-03-16 MED ORDER — INSULIN GLARGINE 300 UNIT/ML ~~LOC~~ SOPN: 52.0000 [IU] | PEN_INJECTOR | Freq: Every day | SUBCUTANEOUS | 3 refills | Status: DC

## 2018-03-16 NOTE — Addendum Note (Signed)
Addended by: Karren Cobble on: 03/16/2018 10:55 AM   Modules accepted: Orders

## 2018-03-18 ENCOUNTER — Ambulatory Visit (INDEPENDENT_AMBULATORY_CARE_PROVIDER_SITE_OTHER): Payer: Medicare Other | Admitting: Internal Medicine

## 2018-03-18 ENCOUNTER — Encounter: Payer: Self-pay | Admitting: Internal Medicine

## 2018-03-18 VITALS — BP 134/68 | HR 66 | Ht 71.0 in | Wt 252.2 lb

## 2018-03-18 DIAGNOSIS — I2510 Atherosclerotic heart disease of native coronary artery without angina pectoris: Secondary | ICD-10-CM

## 2018-03-18 DIAGNOSIS — I480 Paroxysmal atrial fibrillation: Secondary | ICD-10-CM

## 2018-03-18 DIAGNOSIS — I4589 Other specified conduction disorders: Secondary | ICD-10-CM

## 2018-03-18 DIAGNOSIS — I6523 Occlusion and stenosis of bilateral carotid arteries: Secondary | ICD-10-CM

## 2018-03-18 MED ORDER — AMIODARONE HCL 200 MG PO TABS: ORAL_TABLET | ORAL | 3 refills | Status: DC

## 2018-03-18 NOTE — Progress Notes (Signed)
HPI [name] returns today for followup of PAF, HTN, CAD and chronic diastolic heart failure. He feels well. He admits to dietary indiscretion. He will eat 3 hamburgers and fries at a time. He has mild/mod AS. Allergies  Allergen Reactions  . Percocet [Oxycodone-Acetaminophen] Nausea And Vomiting  . Metformin And Related Nausea Only    Upset stomach  . Invokana [Canagliflozin] Rash     Current Outpatient Medications  Medication Sig Dispense Refill  . allopurinol (ZYLOPRIM) 100 MG tablet Take 2 tablets (25m total) by mouth daily 180 tablet 3  . aspirin 81 MG chewable tablet Chew 1 tablet (81 mg total) by mouth daily. 90 tablet 1  . atorvastatin (LIPITOR) 40 MG tablet Take 1 tablet by mouth daily 90 tablet 3  . b complex vitamins tablet Take 1 tablet by mouth daily. 100 tablet 3  . Blood Glucose Monitoring Suppl (ONE TOUCH ULTRA 2) W/DEVICE KIT Use as directed Dx E11.9 1 each 0  . Blood Pressure Monitor KIT Use to check blood pressure daily Dx I10 1 each 0  . Cholecalciferol (VITAMIN D-3) 1000 units CAPS Take 1,000-5,000 Units by mouth See admin instructions. 1,000 units once a day on Sun/Mon/Tues/Wed/Thurs/Fri and 5,000 units on Sat    . diltiazem (CARTIA XT) 180 MG 24 hr capsule Take 1 capsule (180 mg total) by mouth daily. 90 capsule 3  . fenofibrate 160 MG tablet Take 160 mg by mouth daily.    . ferrous sulfate 325 (65 FE) MG tablet Take 325 mg by mouth 3 (three) times daily with meals.    . furosemide (LASIX) 40 MG tablet Take 80 mg by mouth 2 (two) times daily. 2 tablets po bid.    .Marland Kitchengabapentin (NEURONTIN) 100 MG capsule Take 1 capsule by mouth twice a day 180 capsule 3  . glipiZIDE (GLUCOTROL) 10 MG tablet Take 1 tablet by mouth twice a day before meals 180 tablet 3  . glucose blood (ONE TOUCH ULTRA TEST) test strip 1 each by Other route 4 (four) times daily. Use to check blood sugar four times a day  Dx: E11.9- insulin Dependant 360 each 1  . hydrALAZINE (APRESOLINE)  25 MG tablet Take 1 tablet by mouth 3 times a day 270 tablet 3  . HYDROcodone-acetaminophen (NORCO/VICODIN) 5-325 MG tablet Take 1 tablet by mouth 2 (two) times daily as needed for moderate pain. 60 tablet 0  . Insulin Glargine (TOUJEO SOLOSTAR) 300 UNIT/ML SOPN Inject 52 Units into the skin daily. 3 pen 3  . Insulin Pen Needle 31G X 5 MM MISC 1 Units by Does not apply route 4 (four) times daily. 150 each 11  . Lancets (ONETOUCH ULTRASOFT) lancets Use to check blood sugars daily 30 each 11  . levothyroxine (SYNTHROID, LEVOTHROID) 150 MCG tablet 1 po q am 90 tablet 3  . nitroGLYCERIN (NITROSTAT) 0.4 MG SL tablet Place 1 tablet under tongue for chest pain. Repeat every 5 minutes as needed, up to 3 doses. If no relief after 3 doses, call 911. 100 tablet 0  . omeprazole (PRILOSEC) 40 MG capsule Take 1 capsule by mouth daily 90 capsule 0  . potassium chloride SA (K-DUR,KLOR-CON) 20 MEQ tablet Take 1 tablet by mouth daily 90 tablet 0  . amiodarone (PACERONE) 200 MG tablet Take one tablet every day except for Sunday. 90 tablet 3   No current facility-administered medications for this visit.      Past Medical History:  Diagnosis Date  . Acute  on chronic diastolic (congestive) heart failure ([place]) 02/05/2017  . Acute on chronic heart failure ([place])   . Allergy   . Arthritis   . Atrial fibrillation with RVR ([place]) 06/25/2017   hx/notes 06/25/2017  . AVM (arteriovenous malformation) of colon   . AVM (arteriovenous malformation) of small bowel, acquired with hemorrhage 01/20/2012   Found on small bowel capsule endoscopy, June 2013   . C. difficile enteritis   . CAD (coronary artery disease)    a. Cath 10/2014 - Mild to moderate diagonal, circumflex and obtuse marginal disease. Innominate artery sternosis.  . Carotid artery disease ([place])    a. Carotid dopple 03/2014 91-79% RICA &  15-05% LICA stenosis b. 6/97  . Carotid stenosis 11/06/2013   2014 B moderate On ASA, Plavix, Lipitor  . Chronic diastolic  CHF (congestive heart failure) ([place]) 07/27/2016   2017 Diltiazem, Toprol, Furosemide, Hydralazine, Amiodarone  . CKD (chronic kidney disease), stage III ([place])   . Clostridium difficile colitis 05/13/2016  . Coronary artery disease involving native coronary artery of native heart without angina pectoris 07/31/2016   Lopressor - d/c 7/19, Lipitor and ASA  . DM (diabetes mellitus), type 2 with peripheral vascular complications ([place]) 9/48/0165   Chronic on Metformin d/c,  Glipizide 2016 Invokana - stopped On Toujeo since 2017  . Dyspnea   . Dysrhythmia    ATRIAL FIBRILATION  . Elevated troponin    a. 09/2014 in setting of AF RVR-->Myoview: EF 49%, no ischemia/infarct->Med Rx.  . Essential hypertension    On Cardizem, toprol - stopped 7/19, furosemide - trial off acei 06/05/2016 due to doe not fully explained > improved 07/07/2016  On diovan 160 mg daily     . GERD (gastroesophageal reflux disease)   . Gout   . H/O transfusion of whole blood   . Heart murmur   . History of PFTs    PFTs 6/16:  FVC 3.73 (87%), FEV1 2.93 (88%), FEV1/FVC 78%, DLCO 69%  . Hx of adenomatous colonic polyps 07/02/2016  . Hyperlipidemia   . Hypertension   . Hypertriglyceridemia 05/16/2013  . Hypothyroidism   . Iron deficiency anemia   . Nonrheumatic aortic valve stenosis   . NSTEMI (non-ST elevated myocardial infarction) ([place])    hx/notes 06/25/2017  . PAD (peripheral artery disease) ([place]) 07/31/2016   On ASA, Plavix  . PAF (paroxysmal atrial fibrillation) ([place])    a. 09/2014: Converted on Dilt;  b. CHA2DS2VASc = 3-->eliquis;  c. 09/2014 Echo: EF 55-60%, mild LVH, mildly dil LA.  [name] Persistent atrial fibrillation ([place])    a. 09/2014: Converted on Dilt;  b. CHA2DS2VASc = 3-->eliquis;  c. 09/2014 Echo: EF 55-60%, mild LVH, mildly dil LA.  2018 stopped Eliquis On amiodarone, cardizem On ASA and Plavix  . Personal history of colonic polyps 2007, 2008   adenoma each time, largest 12 mm in 2007  . Psoriasis   . Septic  phlebitis of upper extremity 08/26/2016   2018 L hand Dr Megan Salon f/u Keflex and Vanc po  . Septic thrombophlebitis of upper extremity 07/31/2016  . Staph infection 07/2016   left hand   . Staphylococcus aureus bacteremia 07/31/2016  . Subclavian artery stenosis, right ([place]) 01/16/2015  . Type II diabetes mellitus ([facility])    TYPE 2    ROS:   All systems reviewed and negative except as noted in the HPI.   Past Surgical History:  Procedure Laterality Date  . APPENDECTOMY  02/09/2014  . CARDIAC CATHETERIZATION N/A 07/29/2016   Procedure:  Right/Left Heart Cath and Coronary Angiography;  Surgeon: Nelva Bush, MD;  Location: [facility];  Service: Cardiovascular;  Laterality: N/A;  . CARDIAC CATHETERIZATION N/A 07/29/2016   Procedure: Coronary Stent Intervention;  Surgeon: Nelva Bush, MD;  Location: [facility];  Service: Cardiovascular;  Laterality: N/A;  . CARDIAC CATHETERIZATION  10/2014  . COLONOSCOPY  02/10/2011   internal hemorrhoids  . COLONOSCOPY W/ POLYPECTOMY  04/09/2006   12 mm adenoma  . COLONOSCOPY W/ POLYPECTOMY  06/17/2007   5 mm adenoma  . COLONOSCOPY WITH PROPOFOL N/A 05/13/2016   Procedure: COLONOSCOPY WITH PROPOFOL;  Surgeon: Manus Gunning, MD;  Location: [facility];  Service: Gastroenterology;  Laterality: N/A;  . ENTEROSCOPY N/A 08/14/2016   Procedure: ENTEROSCOPY;  Surgeon: Manus Gunning, MD;  Location[facility];  Service: Gastroenterology;  Laterality: N/A;  . ESOPHAGOGASTRODUODENOSCOPY  12/23/2011   Procedure: ESOPHAGOGASTRODUODENOSCOPY (EGD);  Surgeon: Irene Shipper, MD;  Location: [facility];  Service: Endoscopy;  Laterality: N/A;  with small bowel bx's  . GIVENS CAPSULE STUDY  12/28/2011  . GIVENS CAPSULE STUDY N/A 08/14/2016   Procedure: GIVENS CAPSULE STUDY;  Surgeon: Manus Gunning, MD;  Location: [facility];  Service: Gastroenterology;  Laterality: N/A;  . KNEE ARTHROSCOPY Right   . LAPAROSCOPIC APPENDECTOMY N/A  02/09/2014   Procedure: APPENDECTOMY LAPAROSCOPIC;  Surgeon: Leighton Ruff, MD;  Location: [facility];  Service: General;  Laterality: N/A;  . LEFT HEART CATHETERIZATION WITH CORONARY ANGIOGRAM N/A 11/15/2014   Procedure: LEFT HEART CATHETERIZATION WITH CORONARY ANGIOGRAM;  Surgeon: Belva Crome, MD;  Location: [facility] C[facility] LAB;  Service: Cardiovascular;  Laterality: N/A;  . SHOULDER OPEN ROTATOR CUFF REPAIR Left      Family History  Problem Relation Age of Onset  . Heart attack Father   . Lung cancer Father   . Diabetes Father   . Stroke Father   . Hypertension Father   . Stroke Mother   . Hypertension Mother   . Cancer Brother        liver  . Heart disease Brother        chf  . COPD Sister   . Malignant hyperthermia Neg Hx      Social History   Socioeconomic History  . Marital status: Married    Spouse name: Not on file  . Number of children: Not on file  . Years of education: 68  . Highest education level: Not on file  Occupational History  . Occupation:  Retired  Scientific laboratory technician  . Financial resource strain: Not on file  . Food insecurity:    Worry: Not on file    Inability: Not on file  . Transportation needs:    Medical: Not on file    Non-medical: Not on file  Tobacco Use  . Smoking status: Former Smoker    Packs/day: 0.70    Years: 48.00    Pack years: 33.60    Types: Cigarettes    Last attempt to quit: 03/29/2006    Years since quitting: 11.9  . Smokeless tobacco: Never Used  Substance and Sexual Activity  . Alcohol use: Yes    Alcohol/week: 1.0 standard drinks    Types: 1 Cans of beer per week    Comment: occasional alcohol intake  . Drug use: No  . Sexual activity: Not Currently  Lifestyle  . Physical activity:    Days per week: Not on file    Minutes per session: Not on file  . Stress: Not  on file  Relationships  . Social connections:    Talks on phone: Not on file    Gets together: Not on file    Attends religious service: Not on file    Active  member of club or organization: Not on file    Attends meetings of clubs or organizations: Not on file    Relationship status: Not on file  . Intimate partner violence:    Fear of current or ex partner: Not on file    Emotionally abused: Not on file    Physically abused: Not on file    Forced sexual activity: Not on file  Other Topics Concern  . Not on file  Social History Narrative   Regular exercise-no   Caffeine Use-yes     BP - 134/68, P - 66, R - 18 Physical Exam:  Well appearing 77 yo man, NAD HEENT: Unremarkable Neck:  6 cm JVD, no thyromegally Lymphatics:  No adenopathy Back:  No CVA tenderness Lungs:  Clear with no wheezes HEART:  Regular rate rhythm, no murmurs, no rubs, no clicks Abd:  soft, positive bowel sounds, no organomegally, no rebound, no guarding Ext:  2 plus pulses, no edema, no cyanosis, no clubbing Skin:  No rashes no nodules Neuro:  CN II through XII intact, motor grossly intact  EKG - nsr with ILBBB   Assess/Plan: 1. PAF - he is maintaining NSR. I have asked him to start weaning amio down. He will take 200 mg daily, none on Sunday.  2. CAD - he denies anginal symptoms. I have asked him to start walking and to eat less. He needs to lose 40 lbs. 3. HTN - his blood pressure is well controlled.  4. Obesity - we discussed the importance of weight loss and eating less and exercising more.  Michael [name].D.

## 2018-03-18 NOTE — Patient Instructions (Signed)
Medication Instructions:  Your physician recommends that you continue on your current medications as directed. Please refer to the Current Medication list given to you today.  1.  Restart your amiodarone 200 mg- Take one tablet by mouth Monday through Saturday.  DO NOT take on Sunday.  Labwork: None ordered.  Testing/Procedures: None ordered.  Follow-Up: Your physician wants you to follow-up in: 6 months with Dr. Lovena Le.   You will receive a reminder letter in the mail two months in advance. If you don't receive a letter, please call our office to schedule the follow-up appointment.  Any Other Special Instructions Will Be Listed Below (If Applicable).  If you need a refill on your cardiac medications before your next appointment, please call your pharmacy.

## 2018-03-22 ENCOUNTER — Ambulatory Visit: Payer: Medicare Other | Admitting: Cardiology

## 2018-04-20 DIAGNOSIS — L409 Psoriasis, unspecified: Secondary | ICD-10-CM | POA: Diagnosis not present

## 2018-05-27 ENCOUNTER — Other Ambulatory Visit: Payer: Self-pay | Admitting: Internal Medicine

## 2018-06-02 ENCOUNTER — Ambulatory Visit (INDEPENDENT_AMBULATORY_CARE_PROVIDER_SITE_OTHER): Payer: Medicare Other | Admitting: Internal Medicine

## 2018-06-02 ENCOUNTER — Encounter: Payer: Self-pay | Admitting: Internal Medicine

## 2018-06-02 DIAGNOSIS — I6523 Occlusion and stenosis of bilateral carotid arteries: Secondary | ICD-10-CM

## 2018-06-02 DIAGNOSIS — I4819 Other persistent atrial fibrillation: Secondary | ICD-10-CM

## 2018-06-02 DIAGNOSIS — I739 Peripheral vascular disease, unspecified: Secondary | ICD-10-CM | POA: Diagnosis not present

## 2018-06-02 DIAGNOSIS — E1151 Type 2 diabetes mellitus with diabetic peripheral angiopathy without gangrene: Secondary | ICD-10-CM

## 2018-06-02 DIAGNOSIS — K5521 Angiodysplasia of colon with hemorrhage: Secondary | ICD-10-CM

## 2018-06-02 DIAGNOSIS — I5032 Chronic diastolic (congestive) heart failure: Secondary | ICD-10-CM

## 2018-06-02 NOTE — Progress Notes (Signed)
Subjective:  Patient ID: Michael [name], male    DOB: 07/19/1941  Age: 77 y.o. MRN: [id number]  CC: No chief complaint on file.   HPI Michael [name] presents for OA, DM, HTN. Refused flu shot, Shingrix  Outpatient Medications Prior to Visit  Medication Sig Dispense Refill  . allopurinol (ZYLOPRIM) 100 MG tablet Take 2 tablets (249m total) by mouth daily 180 tablet 3  . amiodarone (PACERONE) 200 MG tablet Take 1 tablet by mouth every day 90 tablet 3  . amiodarone (PACERONE) 200 MG tablet Take one tablet every day except for Sunday. 90 tablet 3  . aspirin 81 MG chewable tablet Chew 1 tablet (81 mg total) by mouth daily. 90 tablet 1  . atorvastatin (LIPITOR) 40 MG tablet Take 1 tablet by mouth daily 90 tablet 3  . b complex vitamins tablet Take 1 tablet by mouth daily. 100 tablet 3  . Blood Glucose Monitoring Suppl (ONE TOUCH ULTRA 2) W/DEVICE KIT Use as directed Dx E11.9 1 each 0  . Blood Pressure Monitor KIT Use to check blood pressure daily Dx I10 1 each 0  . Cholecalciferol (VITAMIN D-3) 1000 units CAPS Take 1,000-5,000 Units by mouth See admin instructions. 1,000 units once a day on Sun/Mon/Tues/Wed/Thurs/Fri and 5,000 units on Sat    . diltiazem (CARTIA XT) 180 MG 24 hr capsule Take 1 capsule (180 mg total) by mouth daily. 90 capsule 3  . fenofibrate 160 MG tablet Take 160 mg by mouth daily.    . ferrous sulfate 325 (65 FE) MG tablet Take 325 mg by mouth 3 (three) times daily with meals.    . furosemide (LASIX) 40 MG tablet Take 80 mg by mouth 2 (two) times daily. 2 tablets po bid.    .Marland Kitchengabapentin (NEURONTIN) 100 MG capsule Take 1 capsule by mouth twice a day 180 capsule 3  . glipiZIDE (GLUCOTROL) 10 MG tablet Take 1 tablet by mouth twice a day before meals 180 tablet 3  . hydrALAZINE (APRESOLINE) 25 MG tablet Take 1 tablet by mouth 3 times a day 270 tablet 3  . HYDROcodone-acetaminophen (NORCO/VICODIN) 5-325 MG tablet Take 1 tablet by mouth 2 (two) times daily as needed for  moderate pain. 60 tablet 0  . Insulin Glargine (TOUJEO SOLOSTAR) 300 UNIT/ML SOPN Inject 52 Units into the skin daily. 3 pen 3  . Insulin Pen Needle 31G X 5 MM MISC 1 Units by Does not apply route 4 (four) times daily. 150 each 11  . Lancets (ONETOUCH ULTRASOFT) lancets Use to check blood sugars daily 30 each 11  . levothyroxine (SYNTHROID, LEVOTHROID) 150 MCG tablet 1 po q am 90 tablet 3  . nitroGLYCERIN (NITROSTAT) 0.4 MG SL tablet Place 1 tablet under tongue for chest pain. Repeat every 5 minutes as needed, up to 3 doses. If no relief after 3 doses, call 911. 25 tablet 11  . omeprazole (PRILOSEC) 40 MG capsule Take 1 capsule by mouth daily 90 capsule 0  . ONE TOUCH ULTRA TEST test strip CHECK BLOOD GLUCOSE (SUGAR) 4 TIMES DAILY 350 each 1  . potassium chloride SA (K-DUR,KLOR-CON) 20 MEQ tablet Take 1 tablet by mouth daily 90 tablet 0   No facility-administered medications prior to visit.     ROS: Review of Systems  Constitutional: Negative for appetite change, fatigue and unexpected weight change.  HENT: Negative for congestion, nosebleeds, sneezing, sore throat and trouble swallowing.   Eyes: Negative for itching and visual disturbance.  Respiratory: Negative for cough.  Cardiovascular: Negative for chest pain, palpitations and leg swelling.  Gastrointestinal: Negative for abdominal distention, blood in stool, diarrhea and nausea.  Genitourinary: Negative for frequency and hematuria.  Musculoskeletal: Positive for arthralgias, back pain and gait problem. Negative for joint swelling and neck pain.  Skin: Negative for rash.  Neurological: Negative for dizziness, tremors, speech difficulty and weakness.  Psychiatric/Behavioral: Negative for agitation, dysphoric mood, sleep disturbance and suicidal ideas. The patient is not nervous/anxious.     Objective:  BP 134/62 (BP Location: Left Arm, Patient Position: Sitting, Cuff Size: Large)   Pulse 69   Temp 98 F (36.7 C) (Oral)   Ht 5'  11" (1.803 m)   Wt 253 lb (114.8 kg)   SpO2 98%   BMI 35.29 kg/m   BP Readings from Last 3 Encounters:  06/02/18 134/62  03/18/18 134/68  02/25/18 132/64    Wt Readings from Last 3 Encounters:  06/02/18 253 lb (114.8 kg)  03/18/18 252 lb 3.2 oz (114.4 kg)  02/25/18 254 lb (115.2 kg)    Physical Exam  Constitutional: He is oriented to person, place, and time. He appears well-developed. No distress.  NAD  HENT:  Mouth/Throat: Oropharynx is clear and moist.  Eyes: Pupils are equal, round, and reactive to light. Conjunctivae are normal.  Neck: Normal range of motion. No JVD present. No thyromegaly present.  Cardiovascular: Normal rate, regular rhythm, normal heart sounds and intact distal pulses. Exam reveals no gallop and no friction rub.  No murmur heard. Pulmonary/Chest: Effort normal and breath sounds normal. No respiratory distress. He has no wheezes. He has no rales. He exhibits no tenderness.  Abdominal: Soft. Bowel sounds are normal. He exhibits no distension and no mass. There is no tenderness. There is no rebound and no guarding.  Musculoskeletal: Normal range of motion. He exhibits tenderness. He exhibits no edema.  Lymphadenopathy:    He has no cervical adenopathy.  Neurological: He is alert and oriented to person, place, and time. He has normal reflexes. No cranial nerve deficit. He exhibits normal muscle tone. He displays a negative Romberg sign. Coordination and gait normal.  Skin: Skin is warm and dry. No rash noted.  Psychiatric: He has a normal mood and affect. His behavior is normal. Judgment and thought content normal.  hands w/OA deformities Dry skin  Lab Results  Component Value Date   WBC 4.4 12/28/2017   HGB 11.1 (L) 12/28/2017   HCT 33.0 (L) 12/28/2017   PLT 154 12/28/2017   GLUCOSE 351 (H) 02/25/2018   CHOL 101 08/01/2016   TRIG 344 (H) 08/01/2016   HDL 22 (L) 08/01/2016   LDLDIRECT 26.0 03/20/2015   LDLCALC 10 08/01/2016   ALT 16 (L) 12/28/2017    AST 32 12/28/2017   NA 131 (L) 02/25/2018   K 4.5 02/25/2018   CL 97 02/25/2018   CREATININE 2.11 (H) 02/25/2018   BUN 32 (H) 02/25/2018   CO2 26 02/25/2018   TSH 3.89 10/27/2017   PSA 0.50 11/07/2013   INR 1.06 12/08/2016   HGBA1C 9.6 (H) 02/25/2018   MICROALBUR 1.2 05/16/2013    Vas US Carotid  Result Date: 01/21/2018 Carotid Arterial Duplex Study Indications:       Carotid artery disease and Right subclavian stenosis. Risk Factors:      Hypertension, hyperlipidemia, diabetes, past history of                    smoking, coronary artery disease. Other Factors:  Technically challenging exam due to patient                    respiration/movement. Comparison Study:  Previous carotid duplex examination performed in 12/2016                    showed the right ICA with velocities of 169/41 cm/sec and the                    left ICA with velocities of 210/45 cm/sec. The right                    subclavian artery was noted to be monophasic with brachial                    pressure gradient between the arms (right 101 mmHG, left 140                    mmHg). Performing Technologist: Chesley Noon RVT  Examination Guidelines: A complete evaluation includes B-mode imaging, spectral Doppler, color Doppler, and power Doppler as needed of all accessible portions of each vessel. Bilateral testing is considered an integral part of a complete examination. Limited examinations for reoccurring indications may be performed as noted.  Right Carotid Findings: +----------+--------+--------+--------+------------+-----------------------+           PSV cm/sEDV cm/sStenosisDescribe    Comments                +----------+--------+--------+--------+------------+-----------------------+ CCA Prox  91      9                                                   +----------+--------+--------+--------+------------+-----------------------+ CCA Distal79      9                           intimal thickening       +----------+--------+--------+--------+------------+-----------------------+ ICA Prox  151     33              heterogenous                        +----------+--------+--------+--------+------------+-----------------------+ ICA Mid   180     33      40-59%  heterogenousbased on PSV and plaque +----------+--------+--------+--------+------------+-----------------------+ ICA Distal122     22                                                  +----------+--------+--------+--------+------------+-----------------------+ ECA       119     7                                                   +----------+--------+--------+--------+------------+-----------------------+ +----------+--------+-------+--------+-------------------+           PSV cm/sEDV cmsDescribeArm Pressure (mmHG) +----------+--------+-------+--------+-------------------+ [id number]            Stenotic100                 +----------+--------+-------+--------+-------------------+ +---------+--------+--+--------+--+---------------------+  VertebralPSV EB/R83ENM cm/s10Systolic deceleration +---------+--------+--+--------+--+---------------------+ The right brachial artery is patent with blunted triphasic waveform and PSV of 68 cm/sec.  Left Carotid Findings: +----------+--------+--------+--------+------------+---------+           PSV cm/sEDV cm/sStenosisDescribe    Comments  +----------+--------+--------+--------+------------+---------+ CCA Prox  86      11                                    +----------+--------+--------+--------+------------+---------+ CCA Distal68      11                                    +----------+--------+--------+--------+------------+---------+ ICA Prox  221     41      40-59%  heterogenous          +----------+--------+--------+--------+------------+---------+ ICA Mid   142     19                                     +----------+--------+--------+--------+------------+---------+ ICA Distal126     19                          turbulent +----------+--------+--------+--------+------------+---------+ ECA       81      11                                    +----------+--------+--------+--------+------------+---------+ +----------+--------+--------+----------------+-------------------+ SubclavianPSV cm/sEDV cm/sDescribe        Arm Pressure (mmHG) +----------+--------+--------+----------------+-------------------+           188             Multiphasic, WNL140                 +----------+--------+--------+----------------+-------------------+ +---------+--------+--+--------+-+---------+ VertebralPSV cm/s53EDV cm/s7Antegrade +---------+--------+--+--------+-+---------+ The left brachial artery is patent with triphasic waveform and PSV of 122 cm/sec.  Final Interpretation: Right Carotid: Velocities in the right ICA are consistent with a 40-59%                stenosis. (based on PSV and plaque). Left Carotid: Velocities in the left ICA are consistent with a 40-59% stenosis. Vertebrals:  Left vertebral artery demonstrates antegrade flow. Abnormal right              vertebral artery flow with mild right subclavian steal. Subclavians: Right subclavian artery was stenotic. Normal flow hemodynamics were              seen in the left subclavian artery. *See table(s) above for measurements and observations.  Suggest follow up study in 12 months. Electronically signed by Larae Grooms MD on 01/21/2018 at 12:28:16 PM.    Final     Assessment & Plan:   There are no diagnoses linked to this encounter.   No orders of the defined types were placed in this encounter.    Follow-up: No follow-ups on file.  [name], MD

## 2018-06-02 NOTE — Assessment & Plan Note (Signed)
CBC

## 2018-06-02 NOTE — Assessment & Plan Note (Addendum)
Chronic - not well controllled Glipizide On Toujeo since 2017 Titrate Tougeo up by 1unit every 2 days for goal glucose of 120-150 Refused Endo referal

## 2018-06-02 NOTE — Assessment & Plan Note (Signed)
ASA/Plavix 

## 2018-06-02 NOTE — Assessment & Plan Note (Signed)
Diltiazem, Toprol, Furosemide, Hydralazine, Amiodarone

## 2018-06-02 NOTE — Assessment & Plan Note (Signed)
On ASA and Plavix 

## 2018-06-02 NOTE — Patient Instructions (Signed)
Titrate Tougeo up by 1unit every 2 days for goal glucose of 120-150

## 2018-06-16 DIAGNOSIS — L400 Psoriasis vulgaris: Secondary | ICD-10-CM | POA: Diagnosis not present

## 2018-06-16 DIAGNOSIS — Z79899 Other long term (current) drug therapy: Secondary | ICD-10-CM | POA: Diagnosis not present

## 2018-07-26 ENCOUNTER — Ambulatory Visit: Payer: Self-pay | Admitting: *Deleted

## 2018-07-26 DIAGNOSIS — I10 Essential (primary) hypertension: Secondary | ICD-10-CM | POA: Diagnosis not present

## 2018-07-26 DIAGNOSIS — E1165 Type 2 diabetes mellitus with hyperglycemia: Secondary | ICD-10-CM | POA: Diagnosis not present

## 2018-07-26 DIAGNOSIS — G4489 Other headache syndrome: Secondary | ICD-10-CM | POA: Diagnosis not present

## 2018-07-26 NOTE — Telephone Encounter (Signed)
Pt's wife calling, pt present during call. Pt reports sudden onset headache "About an 1 hr to 1-1/2 hours ago". States took blood pressure as "I just didn't feel right and heart was fluttering."  States HR in 70's per home monitor. Pt has H/O a-fib.  States "This fluttering is new."  LOV with cardiology 03/18/18 pt in [facility]. Reports H/A 6/10 but was "severe" per wife's report. Also reports edema both legs from feet to knees for 3-4 days, states pitting. Pt denies any CP, dizziness, SOB. Has not missed any medications. Pt directed to ED; states will follow disposition, wife will drive.  Reason for Disposition . [3] Systolic BP  >= 832 OR Diastolic >= 919 AND [1] cardiac or neurologic symptoms (e.g., chest pain, difficulty breathing, unsteady gait, blurred vision)  Answer Assessment - Initial Assessment Questions 1. BLOOD PRESSURE: "What is the blood pressure?" "Did you take at least two measurements 5 minutes apart?"     214/93  HR 70.  Presently 190/95  HR in 70's 2. ONSET: "When did you take your blood pressure?"     1 hour ago initially, 10 minutes ago 190/95  Other values 206/92   201/93 3. HOW: "How did you obtain the blood pressure?" (e.g., visiting nurse, automatic home BP monitor)     Home monitor,  4. HISTORY: "Do you have a history of high blood pressure?"     yes 5. MEDICATIONS: "Are you taking any medications for blood pressure?" "Have you missed any doses recently?"     no 6. OTHER SYMPTOMS: "Do you have any symptoms?" (e.g., headache, chest pain, blurred vision, difficulty breathing, weakness)     Headache, 6/10, heart "Fluttering" Pitting edema both feet to knee x 3-4 days  Protocols used: HIGH BLOOD PRESSURE-A-AH

## 2018-08-10 ENCOUNTER — Other Ambulatory Visit: Payer: Self-pay | Admitting: Cardiovascular Disease

## 2018-08-10 MED ORDER — DILTIAZEM HCL ER COATED BEADS 180 MG PO CP24: 180.0000 mg | ORAL_CAPSULE | Freq: Every day | ORAL | 1 refills | Status: DC

## 2018-08-10 NOTE — Telephone Encounter (Signed)
Refill Request.  

## 2018-08-17 ENCOUNTER — Other Ambulatory Visit: Payer: Self-pay | Admitting: Cardiovascular Disease

## 2018-08-17 MED ORDER — DILTIAZEM HCL ER COATED BEADS 180 MG PO CP24: 180.0000 mg | ORAL_CAPSULE | Freq: Every day | ORAL | 1 refills | Status: DC

## 2018-08-17 NOTE — Telephone Encounter (Signed)
Pt's medication was sent to pt's pharmacy as requested. Confirmation received.  °

## 2018-09-08 ENCOUNTER — Encounter: Payer: Self-pay | Admitting: Internal Medicine

## 2018-09-08 ENCOUNTER — Other Ambulatory Visit (INDEPENDENT_AMBULATORY_CARE_PROVIDER_SITE_OTHER): Payer: Medicare Other

## 2018-09-08 ENCOUNTER — Other Ambulatory Visit: Payer: Self-pay | Admitting: Cardiovascular Disease

## 2018-09-08 ENCOUNTER — Ambulatory Visit (INDEPENDENT_AMBULATORY_CARE_PROVIDER_SITE_OTHER): Payer: Medicare Other | Admitting: Internal Medicine

## 2018-09-08 DIAGNOSIS — L4050 Arthropathic psoriasis, unspecified: Secondary | ICD-10-CM

## 2018-09-08 DIAGNOSIS — E1140 Type 2 diabetes mellitus with diabetic neuropathy, unspecified: Secondary | ICD-10-CM | POA: Diagnosis not present

## 2018-09-08 DIAGNOSIS — I4819 Other persistent atrial fibrillation: Secondary | ICD-10-CM

## 2018-09-08 DIAGNOSIS — I2510 Atherosclerotic heart disease of native coronary artery without angina pectoris: Secondary | ICD-10-CM | POA: Diagnosis not present

## 2018-09-08 DIAGNOSIS — I739 Peripheral vascular disease, unspecified: Secondary | ICD-10-CM | POA: Diagnosis not present

## 2018-09-08 DIAGNOSIS — I5032 Chronic diastolic (congestive) heart failure: Secondary | ICD-10-CM | POA: Diagnosis not present

## 2018-09-08 DIAGNOSIS — I4891 Unspecified atrial fibrillation: Secondary | ICD-10-CM

## 2018-09-08 DIAGNOSIS — I10 Essential (primary) hypertension: Secondary | ICD-10-CM

## 2018-09-08 DIAGNOSIS — E1151 Type 2 diabetes mellitus with diabetic peripheral angiopathy without gangrene: Secondary | ICD-10-CM

## 2018-09-08 LAB — BASIC METABOLIC PANEL
BUN: 28 mg/dL — ABNORMAL HIGH (ref 6–23)
CHLORIDE: 99 meq/L (ref 96–112)
CO2: 25 mEq/L (ref 19–32)
Calcium: 8.3 mg/dL — ABNORMAL LOW (ref 8.4–10.5)
Creatinine, Ser: 1.55 mg/dL — ABNORMAL HIGH (ref 0.40–1.50)
GFR: 43.82 mL/min — ABNORMAL LOW (ref >60.00)
Glucose, Bld: 397 mg/dL — ABNORMAL HIGH (ref 70–99)
POTASSIUM: 4.4 meq/L (ref 3.5–5.1)
SODIUM: 133 meq/L — AB (ref 135–145)

## 2018-09-08 LAB — HEMOGLOBIN A1C: Hgb A1c MFr Bld: 10 % — ABNORMAL HIGH (ref 4.6–6.5)

## 2018-09-08 NOTE — Assessment & Plan Note (Addendum)
Off Toujeo due to cost.   Labs Compliance w/diet, meds discussed. Off Toujeo due to cost. Refused insulin in vials. On Glimepiride

## 2018-09-08 NOTE — Progress Notes (Signed)
Subjective:  Patient ID: Michael [name], male    DOB: Apr 23, 1941  Age: 78 y.o. MRN: [id number]  CC: No chief complaint on file.   HPI Michael [name] presents for CAD, dyslipidemia, DM f/u. Stopped Toujeo due to cost...  Outpatient Medications Prior to Visit  Medication Sig Dispense Refill  . allopurinol (ZYLOPRIM) 100 MG tablet Take 2 tablets (247m total) by mouth daily 180 tablet 3  . amiodarone (PACERONE) 200 MG tablet Take one tablet every day except for Sunday. 90 tablet 3  . aspirin 81 MG chewable tablet Chew 1 tablet (81 mg total) by mouth daily. 90 tablet 1  . atorvastatin (LIPITOR) 40 MG tablet Take 1 tablet by mouth daily 90 tablet 3  . b complex vitamins tablet Take 1 tablet by mouth daily. 100 tablet 3  . Blood Glucose Monitoring Suppl (ONE TOUCH ULTRA 2) W/DEVICE KIT Use as directed Dx E11.9 1 each 0  . Blood Pressure Monitor KIT Use to check blood pressure daily Dx I10 1 each 0  . Cholecalciferol (VITAMIN D-3) 1000 units CAPS Take 1,000-5,000 Units by mouth See admin instructions. 1,000 units once a day on Sun/Mon/Tues/Wed/Thurs/Fri and 5,000 units on Sat    . diltiazem (CARTIA XT) 180 MG 24 hr capsule Take 1 capsule (180 mg total) by mouth daily. 90 capsule 1  . fenofibrate 160 MG tablet Take 160 mg by mouth daily.    . ferrous sulfate 325 (65 FE) MG tablet Take 325 mg by mouth 3 (three) times daily with meals.    . furosemide (LASIX) 40 MG tablet Take 80 mg by mouth 2 (two) times daily. 2 tablets po bid.    .Marland Kitchengabapentin (NEURONTIN) 100 MG capsule Take 1 capsule by mouth twice a day 180 capsule 3  . glipiZIDE (GLUCOTROL) 10 MG tablet Take 1 tablet by mouth twice a day before meals 180 tablet 3  . hydrALAZINE (APRESOLINE) 25 MG tablet Take 1 tablet by mouth 3 times a day 270 tablet 3  . HYDROcodone-acetaminophen (NORCO/VICODIN) 5-325 MG tablet Take 1 tablet by mouth 2 (two) times daily as needed for moderate pain. 60 tablet 0  . Insulin Glargine (TOUJEO SOLOSTAR) 300  UNIT/ML SOPN Inject 52 Units into the skin daily. 3 pen 3  . Insulin Pen Needle 31G X 5 MM MISC 1 Units by Does not apply route 4 (four) times daily. 150 each 11  . Lancets (ONETOUCH ULTRASOFT) lancets Use to check blood sugars daily 30 each 11  . levothyroxine (SYNTHROID, LEVOTHROID) 150 MCG tablet 1 po q am 90 tablet 3  . nitroGLYCERIN (NITROSTAT) 0.4 MG SL tablet Place 1 tablet under tongue for chest pain. Repeat every 5 minutes as needed, up to 3 doses. If no relief after 3 doses, call 911. 25 tablet 11  . omeprazole (PRILOSEC) 40 MG capsule Take 1 capsule by mouth daily 90 capsule 0  . ONE TOUCH ULTRA TEST test strip CHECK BLOOD GLUCOSE (SUGAR) 4 TIMES DAILY 350 each 1  . potassium chloride SA (K-DUR,KLOR-CON) 20 MEQ tablet Take 1 tablet by mouth daily 90 tablet 0  . amiodarone (PACERONE) 200 MG tablet Take 1 tablet by mouth every day 90 tablet 3   No facility-administered medications prior to visit.     ROS: Review of Systems  Constitutional: Negative for appetite change, fatigue and unexpected weight change.  HENT: Negative for congestion, nosebleeds, sneezing, sore throat and trouble swallowing.   Eyes: Negative for itching and visual disturbance.  Respiratory: Negative  for cough.   Cardiovascular: Negative for chest pain, palpitations and leg swelling.  Gastrointestinal: Negative for abdominal distention, blood in stool, diarrhea and nausea.  Genitourinary: Negative for frequency and hematuria.  Musculoskeletal: Positive for arthralgias, back pain and gait problem. Negative for joint swelling and neck pain.  Skin: Negative for rash.  Neurological: Negative for dizziness, tremors, speech difficulty and weakness.  Psychiatric/Behavioral: Negative for agitation, dysphoric mood, sleep disturbance and suicidal ideas. The patient is not nervous/anxious.     Objective:  BP (!) 164/60 (BP Location: Left Arm, Patient Position: Sitting, Cuff Size: Large)   Pulse 69   Temp 98 F (36.7  C) (Oral)   Ht _0  (1.803 m)   Wt 257 lb (116.6 kg)   SpO2 97%   BMI 35.84 kg/m   BP Readings from Last 3 Encounters:  09/08/18 (!) 164/60  06/02/18 134/62  03/18/18 134/68    Wt Readings from Last 3 Encounters:  09/08/18 257 lb (116.6 kg)  06/02/18 253 lb (114.8 kg)  03/18/18 252 lb 3.2 oz (114.4 kg)    Physical Exam Constitutional:      General: He is not in acute distress.    Appearance: He is well-developed.     Comments: NAD  Eyes:     Conjunctiva/sclera: Conjunctivae normal.     Pupils: Pupils are equal, round, and reactive to light.  Neck:     Musculoskeletal: Normal range of motion.     Thyroid: No thyromegaly.     Vascular: No JVD.  Cardiovascular:     Rate and Rhythm: Normal rate and regular rhythm.     Heart sounds: Normal heart sounds. No murmur. No friction rub. No gallop.   Pulmonary:     Effort: Pulmonary effort is normal. No respiratory distress.     Breath sounds: Normal breath sounds. No wheezing or rales.  Chest:     Chest wall: No tenderness.  Abdominal:     General: Bowel sounds are normal. There is no distension.     Palpations: Abdomen is soft. There is no mass.     Tenderness: There is no abdominal tenderness. There is no guarding or rebound.  Musculoskeletal: Normal range of motion.        General: No tenderness.  Lymphadenopathy:     Cervical: No cervical adenopathy.  Skin:    General: Skin is warm and dry.     Findings: No rash.  Neurological:     Mental Status: He is alert and oriented to person, place, and time.     Cranial Nerves: No cranial nerve deficit.     Motor: No abnormal muscle tone.     Coordination: Coordination normal.     Gait: Gait normal.     Deep Tendon Reflexes: Reflexes are normal and symmetric.  Psychiatric:        Behavior: Behavior normal.        Thought Content: Thought content normal.        Judgment: Judgment normal.   stiff Hands w/contractures  Lab Results  Component Value Date   WBC 4.4  12/28/2017   HGB 11.1 (L) 12/28/2017   HCT 33.0 (L) 12/28/2017   PLT 154 12/28/2017   GLUCOSE 351 (H) 02/25/2018   CHOL 101 08/01/2016   TRIG 344 (H) 08/01/2016   HDL 22 (L) 08/01/2016   LDLDIRECT 26.0 03/20/2015   LDLCALC 10 08/01/2016   ALT 16 (L) 12/28/2017   AST 32 12/28/2017   NA 131 (L) 02/25/2018  K 4.5 02/25/2018   CL 97 02/25/2018   CREATININE 2.11 (H) 02/25/2018   BUN 32 (H) 02/25/2018   CO2 26 02/25/2018   TSH 3.89 10/27/2017   PSA 0.50 11/07/2013   INR 1.06 12/08/2016   HGBA1C 9.6 (H) 02/25/2018   MICROALBUR 1.2 05/16/2013    Vas US Carotid  Result Date: 01/21/2018 Carotid Arterial Duplex Study Indications:       Carotid artery disease and Right subclavian stenosis. Risk Factors:      Hypertension, hyperlipidemia, diabetes, past history of                    smoking, coronary artery disease. Other Factors:     Technically challenging exam due to patient                    respiration/movement. Comparison Study:  Previous carotid duplex examination performed in 12/2016                    showed the right ICA with velocities of 169/41 cm/sec and the                    left ICA with velocities of 210/45 cm/sec. The right                    subclavian artery was noted to be monophasic with brachial                    pressure gradient between the arms (right 101 mmHG, left 140                    mmHg). Performing Technologist: Chesley Noon RVT  Examination Guidelines: A complete evaluation includes B-mode imaging, spectral Doppler, color Doppler, and power Doppler as needed of all accessible portions of each vessel. Bilateral testing is considered an integral part of a complete examination. Limited examinations for reoccurring indications may be performed as noted.  Right Carotid Findings: +----------+--------+--------+--------+------------+-----------------------+           PSV cm/sEDV cm/sStenosisDescribe    Comments                 +----------+--------+--------+--------+------------+-----------------------+ CCA Prox  91      9                                                   +----------+--------+--------+--------+------------+-----------------------+ CCA Distal79      9                           intimal thickening      +----------+--------+--------+--------+------------+-----------------------+ ICA Prox  151     33              heterogenous                        +----------+--------+--------+--------+------------+-----------------------+ ICA Mid   180     33      40-59%  heterogenousbased on PSV and plaque +----------+--------+--------+--------+------------+-----------------------+ ICA Distal122     22                                                  +----------+--------+--------+--------+------------+-----------------------+  ECA       119     7                                                   +----------+--------+--------+--------+------------+-----------------------+ +----------+--------+-------+--------+-------------------+           PSV cm/sEDV cmsDescribeArm Pressure (mmHG) +----------+--------+-------+--------+-------------------+ [id number]            Stenotic100                 +----------+--------+-------+--------+-------------------+ +---------+--------+--+--------+--+---------------------+ VertebralPSV [id number]/[id number] cm/s10Systolic deceleration +---------+--------+--+--------+--+---------------------+ The right brachial artery is patent with blunted triphasic waveform and PSV of 68 cm/sec.  Left Carotid Findings: +----------+--------+--------+--------+------------+---------+           PSV cm/sEDV cm/sStenosisDescribe    Comments  +----------+--------+--------+--------+------------+---------+ CCA Prox  86      11                                    +----------+--------+--------+--------+------------+---------+ CCA Distal68      11                                     +----------+--------+--------+--------+------------+---------+ ICA Prox  221     41      40-59%  heterogenous          +----------+--------+--------+--------+------------+---------+ ICA Mid   142     19                                    +----------+--------+--------+--------+------------+---------+ ICA Distal126     19                          turbulent +----------+--------+--------+--------+------------+---------+ ECA       81      11                                    +----------+--------+--------+--------+------------+---------+ +----------+--------+--------+----------------+-------------------+ SubclavianPSV cm/sEDV cm/sDescribe        Arm Pressure (mmHG) +----------+--------+--------+----------------+-------------------+           188             Multiphasic, WNL140                 +----------+--------+--------+----------------+-------------------+ +---------+--------+--+--------+-+---------+ VertebralPSV cm/s53EDV cm/s7Antegrade +---------+--------+--+--------+-+---------+ The left brachial artery is patent with triphasic waveform and PSV of 122 cm/sec.  Final Interpretation: Right Carotid: Velocities in the right ICA are consistent with a 40-59%                stenosis. (based on PSV and plaque). Left Carotid: Velocities in the left ICA are consistent with a 40-59% stenosis. Vertebrals:  Left vertebral artery demonstrates antegrade flow. Abnormal right              vertebral artery flow with mild right subclavian steal. Subclavians: Right subclavian artery was stenotic. Normal flow hemodynamics were              seen in the left subclavian artery. *See table(s) above for measurements and observations.  Suggest follow up  study in 12 months. Electronically signed by Larae Grooms MD on 01/21/2018 at 12:28:16 PM.    Final     Assessment & Plan:   There are no diagnoses linked to this encounter.   No orders of the defined types were placed in  this encounter.    Follow-up: No follow-ups on file.  [name], MD

## 2018-09-08 NOTE — Assessment & Plan Note (Signed)
Lipitor, fenofibrate and ASA

## 2018-09-08 NOTE — Assessment & Plan Note (Signed)
ASA, amiodarone, Cartia XT

## 2018-09-08 NOTE — Assessment & Plan Note (Signed)
On Cardizem

## 2018-09-08 NOTE — Assessment & Plan Note (Signed)
Humira 

## 2018-09-08 NOTE — Assessment & Plan Note (Signed)
On amiodarone, cardizem On ASA and Plavix

## 2018-09-08 NOTE — Assessment & Plan Note (Signed)
Diltiazem,Amiodarone

## 2018-09-09 ENCOUNTER — Telehealth: Payer: Self-pay | Admitting: Cardiovascular Disease

## 2018-09-09 NOTE — Telephone Encounter (Signed)
[facility] requesting a refill on Fenofibrate 160 mg tablet. Would Dr. Johnsie Cancel like to refill this medication? Please address

## 2018-09-09 NOTE — Telephone Encounter (Signed)
Yes, already addressed.

## 2018-09-12 ENCOUNTER — Telehealth: Payer: Self-pay | Admitting: *Deleted

## 2018-09-12 NOTE — Telephone Encounter (Signed)
Tamera, please inform the patient that insulin pens are not available at the price they are willing to pay.  Only option is to use generic insulin supplied in vials. Thank you

## 2018-09-12 NOTE — Telephone Encounter (Signed)
Routing to dr plotnikov, please advise, I will call patient back, thannks

## 2018-09-12 NOTE — Telephone Encounter (Signed)
Dear Mr. Delmundo,  Your labs/tests are good. Your glucose was very high at 397. Your hemoglobin A1c was elevated at 10%. Our goal is to have your hemoglobin A1c less than 7%. Your kidney tests are better. You need to be on insulin. Would you agree to inject insulin from the vial with a syringe? It should be affordable.  Sincerely,  [name], MD   Patient's wife called regarding result note- she states her husband will use pen- but not syringe. Told her I would send information to PCP to decide what would be best for patient to do.  Wife states to call home home - her cell phone is broke now - mat leave message if she does not answer.

## 2018-09-13 NOTE — Telephone Encounter (Signed)
Patients wife has called stating script has not been sent yet to the pharmacy.  Patient is requesting follow up call once sent.

## 2018-09-13 NOTE — Telephone Encounter (Signed)
Routing to dr plotnikov-----pt was concerned that syringes needles would be very long---I have explained that syringes/needles used to draw up insulin from vials are very small, much like needles with prefilled pens---patient is ok with trying vials---please send to his pharmacy/[facility] on [address]---patient will try this

## 2018-09-14 ENCOUNTER — Other Ambulatory Visit: Payer: Self-pay | Admitting: Internal Medicine

## 2018-09-14 MED ORDER — INSULIN ASPART PROT & ASPART (70-30 MIX) 100 UNIT/ML PEN: PEN_INJECTOR | SUBCUTANEOUS | 11 refills | Status: DC

## 2018-09-14 MED ORDER — "SYRINGE/NEEDLE (DISP) 30G X 1/2"" 1 ML MISC": 1.0000 | Freq: Two times a day (BID) | 11 refills | Status: DC

## 2018-09-14 NOTE — Telephone Encounter (Signed)
FYI Patients spouse states that she has refilled patients Toujeo.

## 2018-09-14 NOTE — Addendum Note (Signed)
Addended by: Cassandria Anger on: 09/14/2018 01:10 PM   Modules accepted: Orders

## 2018-09-14 NOTE — Telephone Encounter (Signed)
Done. Thx.

## 2018-09-15 ENCOUNTER — Other Ambulatory Visit: Payer: Self-pay | Admitting: Internal Medicine

## 2018-09-15 MED ORDER — "NEEDLE (DISP) 30G X 1/2"" MISC": 11 refills | Status: AC

## 2018-09-15 NOTE — Telephone Encounter (Signed)
Noted OK Thx 

## 2018-09-16 ENCOUNTER — Other Ambulatory Visit: Payer: Self-pay

## 2018-09-16 MED ORDER — INSULIN PEN NEEDLE 32G X 4 MM MISC: 11 refills | Status: AC

## 2018-09-26 NOTE — Telephone Encounter (Signed)
Pt is having gout pain in his knee and having trouble with [facility] getting the medication sent out. She has contacted them 3x and they still have not shipped it. Please send 30 day supply to [facility] please.  allopurinol (ZYLOPRIM) 100 MG tablet   [place], [place] [phone] (Phone) [phone] (Fax)

## 2018-09-26 NOTE — Addendum Note (Signed)
Addended by: Karren Cobble on: 09/26/2018 11:08 AM   Modules accepted: Orders

## 2018-09-26 NOTE — Telephone Encounter (Signed)
Pt also asking for refill on pain medication due to gout pain.  HYDROcodone-acetaminophen (NORCO/VICODIN) 5-325 MG tablet   [place], [place] [address] [phone] (Phone) [phone] (Fax)

## 2018-09-27 MED ORDER — ALLOPURINOL 100 MG PO TABS: ORAL_TABLET | ORAL | 1 refills | Status: DC

## 2018-09-27 NOTE — Telephone Encounter (Signed)
Pt's wife calling to follow up on refills for allopurinol and hydrocodone. Stated pt has been out of allopurinol for 3 weeks. Please advise.   [place], [place] [phone] (Phone) [phone] (Fax)

## 2018-09-27 NOTE — Telephone Encounter (Signed)
Patient's wife called asking about the Allopurinol refill.  I see that it was denied because the refill was requested too soon.  A year supply was sent in August of 2019 to the mail order pharmacy. They are no longer using this pharmacy and ask that it be sent to [facility]  on [address].

## 2018-09-27 NOTE — Addendum Note (Signed)
Addended by: Karren Cobble on: 09/27/2018 04:50 PM   Modules accepted: Orders

## 2018-09-28 MED ORDER — ALLOPURINOL 100 MG PO TABS: ORAL_TABLET | ORAL | 5 refills | Status: DC

## 2018-09-28 MED ORDER — HYDROCODONE-ACETAMINOPHEN 5-325 MG PO TABS: 1.0000 | ORAL_TABLET | Freq: Two times a day (BID) | ORAL | 0 refills | Status: DC | PRN

## 2018-09-28 NOTE — Addendum Note (Signed)
Addended by: Cassandria Anger on: 09/28/2018 07:34 AM   Modules accepted: Orders

## 2018-09-28 NOTE — Telephone Encounter (Signed)
Done. Thx.

## 2018-10-10 ENCOUNTER — Other Ambulatory Visit: Payer: Self-pay

## 2018-10-10 ENCOUNTER — Ambulatory Visit (INDEPENDENT_AMBULATORY_CARE_PROVIDER_SITE_OTHER)
Admission: RE | Admit: 2018-10-10 | Discharge: 2018-10-10 | Disposition: A | Discharge status: Home / Self Care | Payer: Medicare Other | Source: Ambulatory Visit | Attending: Family | Admitting: Family

## 2018-10-10 ENCOUNTER — Other Ambulatory Visit (INDEPENDENT_AMBULATORY_CARE_PROVIDER_SITE_OTHER): Payer: Medicare Other

## 2018-10-10 ENCOUNTER — Encounter: Payer: Self-pay | Admitting: Family

## 2018-10-10 ENCOUNTER — Ambulatory Visit (INDEPENDENT_AMBULATORY_CARE_PROVIDER_SITE_OTHER): Payer: Medicare Other | Admitting: Family

## 2018-10-10 ENCOUNTER — Other Ambulatory Visit: Payer: Self-pay | Admitting: Family

## 2018-10-10 VITALS — BP 168/82 | HR 74 | Temp 97.8°F | Ht 71.0 in | Wt 260.0 lb

## 2018-10-10 DIAGNOSIS — M1712 Unilateral primary osteoarthritis, left knee: Secondary | ICD-10-CM | POA: Diagnosis not present

## 2018-10-10 DIAGNOSIS — S99922A Unspecified injury of left foot, initial encounter: Secondary | ICD-10-CM

## 2018-10-10 DIAGNOSIS — R7989 Other specified abnormal findings of blood chemistry: Secondary | ICD-10-CM

## 2018-10-10 DIAGNOSIS — M1A0620 Idiopathic chronic gout, left knee, without tophus (tophi): Secondary | ICD-10-CM

## 2018-10-10 LAB — CBC WITH DIFFERENTIAL/PLATELET
Basophils Absolute: 0 10*3/uL (ref 0.0–0.1)
Basophils Relative: 0.8 % (ref 0.0–3.0)
Eosinophils Absolute: 0.2 10*3/uL (ref 0.0–0.7)
Eosinophils Relative: 3.8 % (ref 0.0–5.0)
HCT: 32.6 % — ABNORMAL LOW (ref 39.0–52.0)
Hemoglobin: 11.6 g/dL — ABNORMAL LOW (ref 13.0–17.0)
Lymphocytes Relative: 14.3 % (ref 12.0–46.0)
Lymphs Abs: 0.6 10*3/uL — ABNORMAL LOW (ref 0.7–4.0)
MCHC: 35.6 g/dL (ref 30.0–36.0)
MCV: 87.1 fl (ref 78.0–100.0)
Monocytes Absolute: 0.3 10*3/uL (ref 0.1–1.0)
Monocytes Relative: 6.6 % (ref 3.0–12.0)
NEUTROS ABS: 3.4 10*3/uL (ref 1.4–7.7)
NEUTROS PCT: 74.5 % (ref 43.0–77.0)
PLATELETS: 166 10*3/uL (ref 150.0–400.0)
RBC: 3.74 Mil/uL — ABNORMAL LOW (ref 4.22–5.81)
RDW: 16.9 % — ABNORMAL HIGH (ref 11.5–15.5)
WBC: 4.5 10*3/uL (ref 4.0–10.5)

## 2018-10-10 LAB — COMPREHENSIVE METABOLIC PANEL
ALT: 11 U/L (ref 0–53)
AST: 12 U/L (ref 0–37)
Albumin: 4.2 g/dL (ref 3.5–5.2)
Alkaline Phosphatase: 37 U/L — ABNORMAL LOW (ref 39–117)
BUN: 29 mg/dL — ABNORMAL HIGH (ref 6–23)
CO2: 25 mEq/L (ref 19–32)
Calcium: 8.6 mg/dL (ref 8.4–10.5)
Chloride: 101 mEq/L (ref 96–112)
Creatinine, Ser: 1.89 mg/dL — ABNORMAL HIGH (ref 0.40–1.50)
GFR: 34.85 mL/min — ABNORMAL LOW (ref >60.00)
GLUCOSE: 371 mg/dL — AB (ref 70–99)
Potassium: 4.4 mEq/L (ref 3.5–5.1)
Sodium: 135 mEq/L (ref 135–145)
Total Bilirubin: 0.8 mg/dL (ref 0.2–1.2)
Total Protein: 6.9 g/dL (ref 6.0–8.3)

## 2018-10-10 LAB — URIC ACID: Uric Acid, Serum: 5.2 mg/dL (ref 4.0–7.8)

## 2018-10-10 NOTE — Progress Notes (Signed)
Michael [name] is a 78 y.o. male with the following history as recorded in EpicCare:  Patient Active Problem List   Diagnosis Date Noted  . Atrial fibrillation with rapid ventricular response ([place]) 06/25/2017  . Atrial fibrillation with RVR ([place]) 06/25/2017  . GERD (gastroesophageal reflux disease) 02/05/2017  . PAF (paroxysmal atrial fibrillation) ([place]) 02/05/2017  . CKD (chronic kidney disease), stage III ([place]) 02/05/2017  . Acute on chronic diastolic (congestive) heart failure ([place]) 02/05/2017  . History of GI bleed 08/26/2016  . Septic phlebitis of upper extremity 08/26/2016  . Coronary artery disease involving native coronary artery of native heart without angina pectoris 07/31/2016  . PAD (peripheral artery disease) ([place]) 07/31/2016  . Septic thrombophlebitis of upper extremity 07/31/2016  . History of Clostridium difficile colitis 07/31/2016  . Nonrheumatic aortic valve stenosis   . Chronic diastolic CHF (congestive heart failure) ([place]) 07/27/2016  . Hx of adenomatous colonic polyps 07/02/2016  . Shortness of breath 06/05/2016  . Hypothyroidism 03/17/2016  . Noncompliance with diet and medication regimen 12/16/2015  . Frequency-urgency syndrome 03/08/2015  . Subclavian artery stenosis, right ([place]) 01/16/2015  . Cholelithiasis 01/16/2015  . Restless leg syndrome 01/10/2015  . Postherpetic neuralgia 12/18/2014  . Persistent atrial fibrillation ([place])   . Hyperlipidemia   . Essential hypertension   . Psoriasis 03/19/2014  . Vitamin D deficiency 11/13/2013  . Carotid stenosis 11/06/2013  . Psoriatic arthritis ([place]) 11/06/2013  . Hypertriglyceridemia 05/16/2013  . Morbid obesity due to excess calories ([place]) 09/10/2012  . Gout 09/10/2012  . AVM (arteriovenous malformation) of small bowel, acquired with hemorrhage 01/20/2012  . Iron deficiency anemia due to chronic blood loss 01/20/2012  . DM (diabetes mellitus), type 2 with peripheral vascular complications ([place]) 40/98/1191    Current Outpatient Medications  Medication Sig Dispense Refill  . allopurinol (ZYLOPRIM) 100 MG tablet Take 2 tablets (231m total) by mouth daily 60 tablet 5  . amiodarone (PACERONE) 200 MG tablet Take one tablet every day except for Sunday. 90 tablet 3  . aspirin 81 MG chewable tablet Chew 1 tablet (81 mg total) by mouth daily. 90 tablet 1  . atorvastatin (LIPITOR) 40 MG tablet Take 1 tablet by mouth daily 90 tablet 3  . b complex vitamins tablet Take 1 tablet by mouth daily. 100 tablet 3  . Blood Glucose Monitoring Suppl (ONE TOUCH ULTRA 2) W/DEVICE KIT Use as directed Dx E11.9 1 each 0  . Blood Pressure Monitor KIT Use to check blood pressure daily Dx I10 1 each 0  . Cholecalciferol (VITAMIN D-3) 1000 units CAPS Take 1,000-5,000 Units by mouth See admin instructions. 1,000 units once a day on Sun/Mon/Tues/Wed/Thurs/Fri and 5,000 units on Sat    . diltiazem (CARTIA XT) 180 MG 24 hr capsule Take 1 capsule (180 mg total) by mouth daily. 90 capsule 1  . fenofibrate 160 MG tablet Take 1 tablet by mouth daily (Please schedule appointment for future refills) 90 tablet 1  . ferrous sulfate 325 (65 FE) MG tablet Take 325 mg by mouth 3 (three) times daily with meals.    . furosemide (LASIX) 40 MG tablet Take 80 mg by mouth 2 (two) times daily. 2 tablets po bid.    .Marland Kitchengabapentin (NEURONTIN) 100 MG capsule Take 1 capsule by mouth twice a day 180 capsule 3  . glipiZIDE (GLUCOTROL) 10 MG tablet Take 1 tablet by mouth twice a day before meals 180 tablet 3  . hydrALAZINE (APRESOLINE) 25 MG tablet Take 1 tablet by mouth 3  times a day 270 tablet 3  . HYDROcodone-acetaminophen (NORCO/VICODIN) 5-325 MG tablet Take 1 tablet by mouth 2 (two) times daily as needed for moderate pain. 60 tablet 0  . insulin aspart protamine - aspart (NOVOLOG 70/30 MIX) (70-30) 100 UNIT/ML FlexPen 30 units sq in am and 20 units sq in pm 15 mL 11  . Insulin Pen Needle 32G X 4 MM MISC Use to administer insulin 100 each 11  . Lancets  (ONETOUCH ULTRASOFT) lancets Use to check blood sugars daily 30 each 11  . levothyroxine (SYNTHROID, LEVOTHROID) 150 MCG tablet 1 po q am 90 tablet 3  . NEEDLE, DISP, 30 G (BD DISP NEEDLES) 30G X 1/2" MISC USE WITH 1 SYRINGE SUBCUTANEOUSLY  TWICE DAILY 100 each 11  . nitroGLYCERIN (NITROSTAT) 0.4 MG SL tablet Place 1 tablet under tongue for chest pain. Repeat every 5 minutes as needed, up to 3 doses. If no relief after 3 doses, call 911. 25 tablet 11  . omeprazole (PRILOSEC) 40 MG capsule Take 1 capsule by mouth daily 90 capsule 0  . ONE TOUCH ULTRA TEST test strip CHECK BLOOD GLUCOSE (SUGAR) 4 TIMES DAILY 350 each 1  . potassium chloride SA (K-DUR,KLOR-CON) 20 MEQ tablet Take 1 tablet by mouth daily 90 tablet 0   No current facility-administered medications for this visit.     Allergies: Percocet [oxycodone-acetaminophen]; Metformin and related; and Invokana [canagliflozin]  Past Medical History:  Diagnosis Date  . Acute on chronic diastolic (congestive) heart failure ([place]) 02/05/2017  . Acute on chronic heart failure ([place])   . Allergy   . Arthritis   . Atrial fibrillation with RVR ([place]) 06/25/2017   hx/notes 06/25/2017  . AVM (arteriovenous malformation) of colon   . AVM (arteriovenous malformation) of small bowel, acquired with hemorrhage 01/20/2012   Found on small bowel capsule endoscopy, June 2013   . C. difficile enteritis   . CAD (coronary artery disease)    a. Cath 10/2014 - Mild to moderate diagonal, circumflex and obtuse marginal disease. Innominate artery sternosis.  . Carotid artery disease ([place])    a. Carotid dopple 03/2014 25-36% RICA &  64-40% LICA stenosis b. 3/47  . Carotid stenosis 11/06/2013   2014 B moderate On ASA, Plavix, Lipitor  . Chronic diastolic CHF (congestive heart failure) ([place]) 07/27/2016   2017 Diltiazem, Toprol, Furosemide, Hydralazine, Amiodarone  . CKD (chronic kidney disease), stage III ([place])   . Clostridium difficile colitis 05/13/2016  . Coronary  artery disease involving native coronary artery of native heart without angina pectoris 07/31/2016   Lopressor - d/c 7/19, Lipitor and ASA  . DM (diabetes mellitus), type 2 with peripheral vascular complications ([place]) 11/18/9561   Chronic on Metformin d/c,  Glipizide 2016 Invokana - stopped On [place] since 2017  . Dyspnea   . Dysrhythmia    ATRIAL FIBRILATION  . Elevated troponin    a. 09/2014 in setting of AF RVR-->Myoview: EF 49%, no ischemia/infarct->Med Rx.  . Essential hypertension    On Cardizem, toprol - stopped 7/19, furosemide - trial off acei 06/05/2016 due to doe not fully explained > improved 07/07/2016  On diovan 160 mg daily     . GERD (gastroesophageal reflux disease)   . Gout   . H/O transfusion of whole blood   . Heart murmur   . History of PFTs    PFTs 6/16:  FVC 3.73 (87%), FEV1 2.93 (88%), FEV1/FVC 78%, DLCO 69%  . Hx of adenomatous colonic polyps 07/02/2016  . Hyperlipidemia   .  Hypertension   . Hypertriglyceridemia 05/16/2013  . Hypothyroidism   . Iron deficiency anemia   . Nonrheumatic aortic valve stenosis   . NSTEMI (non-ST elevated myocardial infarction) ([place])    hx/notes 06/25/2017  . PAD (peripheral artery disease) ([place]) 07/31/2016   On ASA, Plavix  . PAF (paroxysmal atrial fibrillation) ([place])    a. 09/2014: Converted on Dilt;  b. CHA2DS2VASc = 3-->eliquis;  c. 09/2014 Echo: EF 55-60%, mild LVH, mildly dil LA.  [name] Persistent atrial fibrillation    a. 09/2014: Converted on Dilt;  b. CHA2DS2VASc = 3-->eliquis;  c. 09/2014 Echo: EF 55-60%, mild LVH, mildly dil LA.  2018 stopped Eliquis On amiodarone, cardizem On ASA and Plavix  . Personal history of colonic polyps 2007, 2008   adenoma each time, largest 12 mm in 2007  . Psoriasis   . Septic phlebitis of upper extremity 08/26/2016   2018 L hand Dr Megan Salon f/u Keflex and Vanc po  . Septic thrombophlebitis of upper extremity 07/31/2016  . Staph infection 07/2016   left hand   . Staphylococcus aureus bacteremia 07/31/2016   . Subclavian artery stenosis, right ([place]) 01/16/2015  . Type II diabetes mellitus ([place])    TYPE 2    Past Surgical History:  Procedure Laterality Date  . APPENDECTOMY  02/09/2014  . CARDIAC CATHETERIZATION N/A 07/29/2016   Procedure: Right/Left Heart Cath and Coronary Angiography;  Surgeon: Nelva Bush, MD;  Location: [facility];  Service: Cardiovascular;  Laterality: N/A;  . CARDIAC CATHETERIZATION N/A 07/29/2016   Procedure: Coronary Stent Intervention;  Surgeon: Nelva Bush, MD;  Location: [facility];  Service: Cardiovascular;  Laterality: N/A;  . CARDIAC CATHETERIZATION  10/2014  . COLONOSCOPY  02/10/2011   internal hemorrhoids  . COLONOSCOPY W/ POLYPECTOMY  04/09/2006   12 mm adenoma  . COLONOSCOPY W/ POLYPECTOMY  06/17/2007   5 mm adenoma  . COLONOSCOPY WITH PROPOFOL N/A 05/13/2016   Procedure: COLONOSCOPY WITH PROPOFOL;  Surgeon: Manus Gunning, MD;  Location: [facility];  Service: Gastroenterology;  Laterality: N/A;  . ENTEROSCOPY N/A 08/14/2016   Procedure: ENTEROSCOPY;  Surgeon: Manus Gunning, MD;  Location: [facility];  Service: Gastroenterology;  Laterality: N/A;  . ESOPHAGOGASTRODUODENOSCOPY  12/23/2011   Procedure: ESOPHAGOGASTRODUODENOSCOPY (EGD);  Surgeon: Irene Shipper, MD;  Location: [facility];  Service: Endoscopy;  Laterality: N/A;  with small bowel bx's  . GIVENS CAPSULE STUDY  12/28/2011  . GIVENS CAPSULE STUDY N/A 08/14/2016   Procedure: GIVENS CAPSULE STUDY;  Surgeon: Manus Gunning, MD;  Location: [facility];  Service: Gastroenterology;  Laterality: N/A;  . KNEE ARTHROSCOPY Right   . LAPAROSCOPIC APPENDECTOMY N/A 02/09/2014   Procedure: APPENDECTOMY LAPAROSCOPIC;  Surgeon: Leighton Ruff, MD;  Location: [facility];  Service: General;  Laterality: N/A;  . LEFT HEART CATHETERIZATION WITH CORONARY ANGIOGRAM N/A 11/15/2014   Procedure: LEFT HEART CATHETERIZATION WITH CORONARY ANGIOGRAM;  Surgeon: Belva Crome, MD;  Location:  [facility] C[facility] LAB;  Service: Cardiovascular;  Laterality: N/A;  . SHOULDER OPEN ROTATOR CUFF REPAIR Left     Family History  Problem Relation Age of Onset  . Heart attack Father   . Lung cancer Father   . Diabetes Father   . Stroke Father   . Hypertension Father   . Stroke Mother   . Hypertension Mother   . Cancer Brother        liver  . Heart disease Brother        chf  . COPD Sister   .  Malignant hyperthermia Neg Hx     Social History   Tobacco Use  . Smoking status: Former Smoker    Packs/day: 0.70    Years: 48.00    Pack years: 33.60    Types: Cigarettes    Last attempt to quit: 03/29/2006    Years since quitting: 12.5  . Smokeless tobacco: Never Used  Substance Use Topics  . Alcohol use: Yes    Alcohol/week: 1.0 standard drinks    Types: 1 Cans of beer per week    Comment: occasional alcohol intake    Subjective:  Patient presents with concerns that left big toe is swollen; history of injury in 2009; notes that he dropped a piece of sheetrock on his toe around Thanksgiving;  Now having problems with the base of the toenail "separating" from the toe; symptoms x 4-5 weeks; notes that bed sheets are catching at the bottom of the nail- worried that going to pull the nail off;  Also mentions that left knee more painful recently; worried that his gout is flared up because there was complication getting his allopurinol refilled last month;     Objective:  Vitals:   10/10/18 0927  BP: (!) 168/82  Pulse: 74  Temp: 97.8 F (36.6 C)  TempSrc: Oral  SpO2: 98%  Weight: 260 lb 0.6 oz (118 kg)  Height: _0  (1.803 m)    General: Well developed, well nourished, in no acute distress  Skin : Warm and dry.  Head: Normocephalic and atraumatic  Lungs: Respirations unlabored; Musculoskeletal: No deformities; no active joint inflammation  Extremities: No edema, cyanosis, clubbing  Vessels: Symmetric bilaterally  Neurologic: Alert and oriented; speech intact; face symmetrical;  moves all extremities well; CNII-XII intact without focal deficit   Assessment:  1. Injury of toenail of left foot, initial encounter   2. Chronic gout of left knee, unspecified cause     Plan:  1. Refer to podiatry- needs to have nail removed/ ? Need for permanent removal; 2. Check CBC, CMP, uric acid level today; check left knee x-ray; follow-up to be determined.  No follow-ups on file.  Orders Placed This Encounter  Procedures  . DG Knee Complete 4 Views Left    Standing Status:   Future    Number of Occurrences:   1    Standing Expiration Date:   12/10/2019    Order Specific Question:   Reason for Exam (SYMPTOM  OR DIAGNOSIS REQUIRED)    Answer:   left knee pain    Order Specific Question:   Preferred imaging location?    Answer:   [facility]    Order Specific Question:   Radiology Contrast Protocol - do NOT remove file path    Answer:   [email or web address]  . CBC w/Diff    Standing Status:   Future    Number of Occurrences:   1    Standing Expiration Date:   10/10/2019  . Comp Met (CMET)    Standing Status:   Future    Number of Occurrences:   1    Standing Expiration Date:   10/10/2019  . Uric acid    Standing Status:   Future    Number of Occurrences:   1    Standing Expiration Date:   10/10/2019  . Ambulatory referral to Podiatry    Referral Priority:   Urgent    Referral Type:   Consultation    Referral Reason:   Specialty Services Required    Requested Specialty:  Podiatry    Number of Visits Requested:   1    Requested Prescriptions    No prescriptions requested or ordered in this encounter

## 2018-10-14 ENCOUNTER — Ambulatory Visit: Payer: Medicare Other | Admitting: Family Medicine

## 2018-10-18 ENCOUNTER — Ambulatory Visit: Payer: Self-pay

## 2018-10-18 ENCOUNTER — Other Ambulatory Visit (INDEPENDENT_AMBULATORY_CARE_PROVIDER_SITE_OTHER): Payer: Medicare Other

## 2018-10-18 ENCOUNTER — Other Ambulatory Visit: Payer: Self-pay

## 2018-10-18 ENCOUNTER — Ambulatory Visit (INDEPENDENT_AMBULATORY_CARE_PROVIDER_SITE_OTHER): Payer: Medicare Other | Admitting: Family Medicine

## 2018-10-18 ENCOUNTER — Encounter: Payer: Self-pay | Admitting: Family Medicine

## 2018-10-18 ENCOUNTER — Ambulatory Visit: Payer: Medicare Other | Admitting: Sports Medicine

## 2018-10-18 VITALS — BP 142/62 | HR 71 | Ht 71.0 in | Wt 255.0 lb

## 2018-10-18 DIAGNOSIS — M2550 Pain in unspecified joint: Secondary | ICD-10-CM | POA: Diagnosis not present

## 2018-10-18 DIAGNOSIS — G8929 Other chronic pain: Secondary | ICD-10-CM

## 2018-10-18 DIAGNOSIS — M1712 Unilateral primary osteoarthritis, left knee: Secondary | ICD-10-CM

## 2018-10-18 DIAGNOSIS — I739 Peripheral vascular disease, unspecified: Secondary | ICD-10-CM | POA: Diagnosis not present

## 2018-10-18 DIAGNOSIS — M25562 Pain in left knee: Principal | ICD-10-CM

## 2018-10-18 DIAGNOSIS — R7989 Other specified abnormal findings of blood chemistry: Secondary | ICD-10-CM | POA: Diagnosis not present

## 2018-10-18 DIAGNOSIS — I2510 Atherosclerotic heart disease of native coronary artery without angina pectoris: Secondary | ICD-10-CM

## 2018-10-18 LAB — CBC WITH DIFFERENTIAL/PLATELET
BASOS ABS: 0 10*3/uL (ref 0.0–0.1)
Basophils Relative: 0.6 % (ref 0.0–3.0)
EOS ABS: 0.2 10*3/uL (ref 0.0–0.7)
Eosinophils Relative: 5.1 % — ABNORMAL HIGH (ref 0.0–5.0)
HCT: 30.5 % — ABNORMAL LOW (ref 39.0–52.0)
Hemoglobin: 10.9 g/dL — ABNORMAL LOW (ref 13.0–17.0)
Lymphocytes Relative: 14.9 % (ref 12.0–46.0)
Lymphs Abs: 0.6 10*3/uL — ABNORMAL LOW (ref 0.7–4.0)
MCHC: 35.9 g/dL (ref 30.0–36.0)
MCV: 86.3 fl (ref 78.0–100.0)
MONO ABS: 0.2 10*3/uL (ref 0.1–1.0)
Monocytes Relative: 6 % (ref 3.0–12.0)
Neutro Abs: 2.8 10*3/uL (ref 1.4–7.7)
Neutrophils Relative %: 73.4 % (ref 43.0–77.0)
Platelets: 166 10*3/uL (ref 150.0–400.0)
RBC: 3.54 Mil/uL — ABNORMAL LOW (ref 4.22–5.81)
RDW: 17.6 % — ABNORMAL HIGH (ref 11.5–15.5)
WBC: 3.8 10*3/uL — ABNORMAL LOW (ref 4.0–10.5)

## 2018-10-18 LAB — BASIC METABOLIC PANEL
BUN: 30 mg/dL — AB (ref 6–23)
CHLORIDE: 100 meq/L (ref 96–112)
CO2: 25 mEq/L (ref 19–32)
Calcium: 8.9 mg/dL (ref 8.4–10.5)
Creatinine, Ser: 1.91 mg/dL — ABNORMAL HIGH (ref 0.40–1.50)
GFR: 34.42 mL/min — ABNORMAL LOW (ref >60.00)
Glucose, Bld: 315 mg/dL — ABNORMAL HIGH (ref 70–99)
Potassium: 4.6 mEq/L (ref 3.5–5.1)
Sodium: 134 mEq/L — ABNORMAL LOW (ref 135–145)

## 2018-10-18 NOTE — Progress Notes (Signed)
[name] [address] [place], [place] [address] Phone: [phone] Subjective:   I Michael [name] am serving as a Education administrator for Dr. Hulan Saas.  I'm seeing this patient by the request  of:  [name], Evie Lacks, MD   CC: Left knee pain  JHE:[id number]  Michael [name] is a 78 y.o. male coming in with complaint of left knee pain. Thought he twisted his knee. Recently had xrays. Has had issues with adductor muscles.   Onset- 2 months Location- knee  Duration-   Character-dull, throbbing aching sensation. Aggravating factors- movement, standing Reliving factors-  Therapies tried-  Severity-7 out of 10.  Difficulty walking   X-rays were independently visualized by me.  X-rays show moderate to severe osteoarthritic changes of the knee noted.  Past Medical History:  Diagnosis Date  . Acute on chronic diastolic (congestive) heart failure ([place]) 02/05/2017  . Acute on chronic heart failure ([place])   . Allergy   . Arthritis   . Atrial fibrillation with RVR ([place]) 06/25/2017   hx/notes 06/25/2017  . AVM (arteriovenous malformation) of colon   . AVM (arteriovenous malformation) of small bowel, acquired with hemorrhage 01/20/2012   Found on small bowel capsule endoscopy, June 2013   . C. difficile enteritis   . CAD (coronary artery disease)    a. Cath 10/2014 - Mild to moderate diagonal, circumflex and obtuse marginal disease. Innominate artery sternosis.  . Carotid artery disease ([place])    a. Carotid dopple 03/2014 85-63% RICA &  14-97% LICA stenosis b. 0/26  . Carotid stenosis 11/06/2013   2014 B moderate On ASA, Plavix, Lipitor  . Chronic diastolic CHF (congestive heart failure) ([place]) 07/27/2016   2017 Diltiazem, Toprol, Furosemide, Hydralazine, Amiodarone  . CKD (chronic kidney disease), stage III ([place])   . Clostridium difficile colitis 05/13/2016  . Coronary artery disease involving native coronary artery of native heart without angina pectoris 07/31/2016   Lopressor - d/c 7/19, Lipitor and ASA  . DM (diabetes mellitus), type 2 with peripheral vascular complications ([place]) 3/78/5885   Chronic on Metformin d/c,  Glipizide 2016 Invokana - stopped On [place]jeo since 2017  . Dyspnea   . Dysrhythmia    ATRIAL FIBRILATION  . Elevated troponin    a. 09/2014 in setting of AF RVR-->Myoview: EF 49%, no ischemia/infarct->Med Rx.  . Essential hypertension    On Cardizem, toprol - stopped 7/19, furosemide - trial off acei 06/05/2016 due to doe not fully explained > improved 07/07/2016  On diovan 160 mg daily     . GERD (gastroesophageal reflux disease)   . Gout   . H/O transfusion of whole blood   . Heart murmur   . History of PFTs    PFTs 6/16:  FVC 3.73 (87%), FEV1 2.93 (88%), FEV1/FVC 78%, DLCO 69%  . Hx of adenomatous colonic polyps 07/02/2016  . Hyperlipidemia   . Hypertension   . Hypertriglyceridemia 05/16/2013  . Hypothyroidism   . Iron deficiency anemia   . Nonrheumatic aortic valve stenosis   . NSTEMI (non-ST elevated myocardial infarction) ([place])    hx/notes 06/25/2017  . PAD (peripheral artery disease) ([place]) 07/31/2016   On ASA, Plavix  . PAF (paroxysmal atrial fibrillation) ([place])    a. 09/2014: Converted on Dilt;  b. CHA2DS2VASc = 3-->eliquis;  c. 09/2014 Echo: EF 55-60%, mild LVH, mildly dil LA.  [name] Persistent atrial fibrillation    a. 09/2014: Converted on Dilt;  b. CHA2DS2VASc = 3-->eliquis;  c. 09/2014 Echo: EF  55-60%, mild LVH, mildly dil LA.  2018 stopped Eliquis On amiodarone, cardizem On ASA and Plavix  . Personal history of colonic polyps 2007, 2008   adenoma each time, largest 12 mm in 2007  . Psoriasis   . Septic phlebitis of upper extremity 08/26/2016   2018 L hand Dr Megan Salon f/u Keflex and Vanc po  . Septic thrombophlebitis of upper extremity 07/31/2016  . Staph infection 07/2016   left hand   . Staphylococcus aureus bacteremia 07/31/2016  . Subclavian artery stenosis, right ([place]) 01/16/2015  . Type II diabetes mellitus ([place])    TYPE  2   Past Surgical History:  Procedure Laterality Date  . APPENDECTOMY  02/09/2014  . CARDIAC CATHETERIZATION N/A 07/29/2016   Procedure: Right/Left Heart Cath and Coronary Angiography;  Surgeon: Nelva Bush, MD;  Location: [facility];  Service: Cardiovascular;  Laterality: N/A;  . CARDIAC CATHETERIZATION N/A 07/29/2016   Procedure: Coronary Stent Intervention;  Surgeon: Nelva Bush, MD;  Location: [facility];  Service: Cardiovascular;  Laterality: N/A;  . CARDIAC CATHETERIZATION  10/2014  . COLONOSCOPY  02/10/2011   internal hemorrhoids  . COLONOSCOPY W/ POLYPECTOMY  04/09/2006   12 mm adenoma  . COLONOSCOPY W/ POLYPECTOMY  06/17/2007   5 mm adenoma  . COLONOSCOPY WITH PROPOFOL N/A 05/13/2016   Procedure: COLONOSCOPY WITH PROPOFOL;  Surgeon: Manus Gunning, MD;  Location: [facility];  Service: Gastroenterology;  Laterality: N/A;  . ENTEROSCOPY N/A 08/14/2016   Procedure: ENTEROSCOPY;  Surgeon: Manus Gunning, MD;  Location[facility];  Service: Gastroenterology;  Laterality: N/A;  . ESOPHAGOGASTRODUODENOSCOPY  12/23/2011   Procedure: ESOPHAGOGASTRODUODENOSCOPY (EGD);  Surgeon: Irene Shipper, MD;  Location: [facility];  Service: Endoscopy;  Laterality: N/A;  with small bowel bx's  . GIVENS CAPSULE STUDY  12/28/2011  . GIVENS CAPSULE STUDY N/A 08/14/2016   Procedure: GIVENS CAPSULE STUDY;  Surgeon: Manus Gunning, MD;  Location: [facility];  Service: Gastroenterology;  Laterality: N/A;  . KNEE ARTHROSCOPY Right   . LAPAROSCOPIC APPENDECTOMY N/A 02/09/2014   Procedure: APPENDECTOMY LAPAROSCOPIC;  Surgeon: Leighton Ruff, MD;  Location: [facility];  Service: General;  Laterality: N/A;  . LEFT HEART CATHETERIZATION WITH CORONARY ANGIOGRAM N/A 11/15/2014   Procedure: LEFT HEART CATHETERIZATION WITH CORONARY ANGIOGRAM;  Surgeon: Belva Crome, MD;  Location: [facility] CATH LAB;  Service: Cardiovascular;  Laterality: N/A;  . SHOULDER OPEN ROTATOR CUFF REPAIR Left     Social History   Socioeconomic History  . Marital status: Married    Spouse name: Not on file  . Number of children: Not on file  . Years of education: 71  . Highest education level: Not on file  Occupational History  . Occupation:  Retired  Scientific laboratory technician  . Financial resource strain: Not on file  . Food insecurity:    Worry: Not on file    Inability: Not on file  . Transportation needs:    Medical: Not on file    Non-medical: Not on file  Tobacco Use  . Smoking status: Former Smoker    Packs/day: 0.70    Years: 48.00    Pack years: 33.60    Types: Cigarettes    Last attempt to quit: 03/29/2006    Years since quitting: 12.5  . Smokeless tobacco: Never Used  Substance and Sexual Activity  . Alcohol use: Yes    Alcohol/week: 1.0 standard drinks    Types: 1 Cans of beer per week    Comment: occasional alcohol  intake  . Drug use: No  . Sexual activity: Not Currently  Lifestyle  . Physical activity:    Days per week: Not on file    Minutes per session: Not on file  . Stress: Not on file  Relationships  . Social connections:    Talks on phone: Not on file    Gets together: Not on file    Attends religious service: Not on file    Active member of club or organization: Not on file    Attends meetings of clubs or organizations: Not on file    Relationship status: Not on file  Other Topics Concern  . Not on file  Social History Narrative   Regular exercise-no   Caffeine Use-yes   Allergies  Allergen Reactions  . Percocet [Oxycodone-Acetaminophen] Nausea And Vomiting  . Metformin And Related Nausea Only    Upset stomach  . Invokana [Canagliflozin] Rash   Family History  Problem Relation Age of Onset  . Heart attack Father   . Lung cancer Father   . Diabetes Father   . Stroke Father   . Hypertension Father   . Stroke Mother   . Hypertension Mother   . Cancer Brother        liver  . Heart disease Brother        chf  . COPD Sister   . Malignant  hyperthermia Neg Hx     Current Outpatient Medications (Endocrine & Metabolic):  .  glipiZIDE (GLUCOTROL) 10 MG tablet, Take 1 tablet by mouth twice a day before meals .  insulin aspart protamine - aspart (NOVOLOG 70/30 MIX) (70-30) 100 UNIT/ML FlexPen, 30 units sq in am and 20 units sq in pm .  levothyroxine (SYNTHROID, LEVOTHROID) 150 MCG tablet, 1 po q am  Current Outpatient Medications (Cardiovascular):  .  amiodarone (PACERONE) 200 MG tablet, Take one tablet every day except for Sunday. [name]  atorvastatin (LIPITOR) 40 MG tablet, Take 1 tablet by mouth daily .  diltiazem (CARTIA XT) 180 MG 24 hr capsule, Take 1 capsule (180 mg total) by mouth daily. .  fenofibrate 160 MG tablet, Take 1 tablet by mouth daily (Please schedule appointment for future refills) .  furosemide (LASIX) 40 MG tablet, Take 80 mg by mouth 2 (two) times daily. 2 tablets po bid. .  hydrALAZINE (APRESOLINE) 25 MG tablet, Take 1 tablet by mouth 3 times a day .  nitroGLYCERIN (NITROSTAT) 0.4 MG SL tablet, Place 1 tablet under tongue for chest pain. Repeat every 5 minutes as needed, up to 3 doses. If no relief after 3 doses, call 911.   Current Outpatient Medications (Analgesics):  .  allopurinol (ZYLOPRIM) 100 MG tablet, Take 2 tablets (231m total) by mouth daily .  aspirin 81 MG chewable tablet, Chew 1 tablet (81 mg total) by mouth daily. .[name] HYDROcodone-acetaminophen (NORCO/VICODIN) 5-325 MG tablet, Take 1 tablet by mouth 2 (two) times daily as needed for moderate pain.  Current Outpatient Medications (Hematological):  .  ferrous sulfate 325 (65 FE) MG tablet, Take 325 mg by mouth 3 (three) times daily with meals.  Current Outpatient Medications (Other):  .  b complex vitamins tablet, Take 1 tablet by mouth daily. .  Blood Glucose Monitoring Suppl (ONE TOUCH ULTRA 2) W/DEVICE KIT, Use as directed Dx E11.9 .  Blood Pressure Monitor KIT, Use to check blood pressure daily Dx I10 .  Cholecalciferol (VITAMIN D-3) 1000  units CAPS, Take 1,000-5,000 Units by mouth See admin instructions. 1,000 units  once a day on Sun/Mon/Tues/Wed/Thurs/Fri and 5,000 units on Sat .  gabapentin (NEURONTIN) 100 MG capsule, Take 1 capsule by mouth twice a day .  Insulin Pen Needle 32G X 4 MM MISC, Use to administer insulin .  Lancets (ONETOUCH ULTRASOFT) lancets, Use to check blood sugars daily .  NEEDLE, DISP, 30 G (BD DISP NEEDLES) 30G X 1/2" MISC, USE WITH 1 SYRINGE SUBCUTANEOUSLY  TWICE DAILY .  omeprazole (PRILOSEC) 40 MG capsule, Take 1 capsule by mouth daily .  ONE TOUCH ULTRA TEST test strip, CHECK BLOOD GLUCOSE (SUGAR) 4 TIMES DAILY .  potassium chloride SA (K-DUR,KLOR-CON) 20 MEQ tablet, Take 1 tablet by mouth daily    Past medical history, social, surgical and family history all reviewed in electronic medical record.  No pertanent information unless stated regarding to the chief complaint.   Review of Systems:  No headache, visual changes, nausea, vomiting, diarrhea, constipation, dizziness, abdominal pain, skin rash, fevers, chills, night sweats, weight loss, swollen lymph nodes, body aches, chest pain, shortness of breath, mood changes.  Positive muscle aches, joint swelling  Objective  Blood pressure (!) 142/62, pulse 71, height 5' 11"  (1.803 m), weight 255 lb (115.7 kg), SpO2 98 %.    General: No apparent distress alert and oriented x3 mood and affect normal, dressed appropriately.  HEENT: Pupils equal, extraocular movements intact  Respiratory: Patient's speak in full sentences and does not appear short of breath  Cardiovascular: Patient is very poor distal pulses.  Venous stasis noted.  Hemosiderin deposits left greater than right.  Extremities or on axial skeleton.  Abdomen: Soft nontender  Neuro: Cranial nerves II through XII are intact, neurovascularly intact in all extremities with 2+ DTRs   Lymph: No lymphadenopathy of posterior or anterior cervical chain or axillae bilaterally.  Gait antalgic MSK:   tender with limited range of motion and good stability and symmetric strength and tone of shoulders, elbows, wrist, hip, and ankles bilaterally.  Arthritic use of multiple joints Left knee exam on expection does have trace effusion noted.  Limited range of motion lacking last 10 degrees of flexion in last 5 degrees of extension.  Positive McMurray's.  Crepitus noted with range of motion.  Limited musculoskeletal ultrasound was performed and interpreted by Lyndal Pulley  Limited ultrasound of patient's left knee shows that patient does have arthritic changes tricompartmental, medial meniscal tear acute on chronic noted with displacement.  Patient does have a moderate joint effusion as well.  Procedure: Real-time Ultrasound Guided Injection of left knee Device: GE Logiq Q7 Ultrasound guided injection is preferred based studies that show increased duration, increased effect, greater accuracy, decreased procedural pain, increased response rate, and decreased cost with ultrasound guided versus blind injection.  Verbal informed consent obtained.  Time-out conducted.  Noted no overlying erythema, induration, or other signs of local infection.  Skin prepped in a sterile fashion.  Local anesthesia: Topical Ethyl chloride.  With sterile technique and under real time ultrasound guidance: With a 22-gauge 2 inch needle patient was injected with 4 cc of 0.5% Marcaine and 1 cc of Kenalog 40 mg/dL. This was from a superior lateral approach.  Completed without difficulty  Pain immediately resolved suggesting accurate placement of the medication.  Advised to call if fevers/chills, erythema, induration, drainage, or persistent bleeding.  Images permanently stored and available for review in the ultrasound unit.  Impression: Technically successful ultrasound guided injection.     Impression and Recommendations:     This case required medical decision making  of moderate complexity. The above documentation  has been reviewed and is accurate and complete Lyndal Pulley, DO       Note: This dictation was prepared with Dragon dictation along with smaller phrase technology. Any transcriptional errors that result from this process are unintentional.

## 2018-10-18 NOTE — Assessment & Plan Note (Signed)
Aspiration of the knee Today.  Patient was given a steroid injection as well.  Discussed icing regimen and home exercises, we discussed potential bracing.  I am concerned that there is a vascularity problem as well.  I believe the patient has poor blood flow with peripheral vascular disease.  ABIs ordered but secondary to the coronavirus likely not going to occur anytime soon.  Patient is a high risk individual and we discussed if worsening pain to call.  I would like him to avoid the emergency room unless he feels he drastically needs it.  Patient's wife is in agreement.  Knows that if the pain severe any fevers or chills that they need to seek medical attention immediately.  Patient will try compression, home exercises icing regimen otherwise.  Follow-up with me again in 2 months

## 2018-10-18 NOTE — Patient Instructions (Signed)
Good to see you  Ice 20 minutes 2 times daily. Usually after activity and before bed. pennsaid pinkie amount topically 2 times daily as needed.  Injected the knee Get labs downstairs  Don't see me again for 2 months should do fine Please stay home

## 2018-11-25 ENCOUNTER — Encounter (HOSPITAL_COMMUNITY): Payer: Medicare Other

## 2018-11-29 ENCOUNTER — Ambulatory Visit: Payer: Medicare Other | Admitting: Sports Medicine

## 2018-12-07 ENCOUNTER — Telehealth: Payer: Self-pay | Admitting: Internal Medicine

## 2018-12-07 MED ORDER — HYDRALAZINE HCL 25 MG PO TABS: 25.0000 mg | ORAL_TABLET | Freq: Three times a day (TID) | ORAL | 3 refills | Status: DC

## 2018-12-07 NOTE — Telephone Encounter (Signed)
Copied from [place] [phone]. Topic: Quick Communication - Rx Refill/Question >> Dec 07, 2018  9:52 AM Sheran Luz wrote: Medication: hydrALAZINE (APRESOLINE) 25 MG tablet  Patient is requesting a refill of this medication.   Preferred Pharmacy (with phone number or street name):[facility]) - [place], [place] [phone] (Phone) [phone] (Fax)

## 2018-12-07 NOTE — Telephone Encounter (Signed)
RX sent

## 2018-12-08 ENCOUNTER — Telehealth: Payer: Self-pay | Admitting: *Deleted

## 2018-12-08 ENCOUNTER — Telehealth: Payer: Self-pay | Admitting: Cardiovascular Disease

## 2018-12-08 ENCOUNTER — Other Ambulatory Visit: Payer: Self-pay | Admitting: *Deleted

## 2018-12-08 MED ORDER — FUROSEMIDE 40 MG PO TABS: 40.0000 mg | ORAL_TABLET | Freq: Every day | ORAL | 3 refills | Status: DC

## 2018-12-08 MED ORDER — FUROSEMIDE 40 MG PO TABS: 80.0000 mg | ORAL_TABLET | Freq: Two times a day (BID) | ORAL | 1 refills | Status: DC

## 2018-12-08 MED ORDER — FUROSEMIDE 40 MG PO TABS: 80.0000 mg | ORAL_TABLET | Freq: Two times a day (BID) | ORAL | 0 refills | Status: DC

## 2018-12-08 NOTE — Telephone Encounter (Signed)
Pharmacy is calling regarding new script that was sent over for furosemide (LASIX) 40 MG tablet taking 1 tablet daily. The previous dosage was twice day. They want to make sure this decrease is correct to [phone] can leave detailed message

## 2018-12-08 NOTE — Telephone Encounter (Signed)
[facility] given lasix SIG that we have on file... no changes have been noted.

## 2018-12-08 NOTE — Telephone Encounter (Signed)
lpmtcb in regards to Lasix prescription.

## 2018-12-08 NOTE — Telephone Encounter (Signed)
Spoke with Waunita Schooner, Pharmacist with [facility] regarding patient refill request for Furosemide 40mg  patient is taking 2 tablets twice a day. #360 with no refill.

## 2018-12-16 ENCOUNTER — Encounter: Payer: Self-pay | Admitting: Internal Medicine

## 2018-12-16 ENCOUNTER — Ambulatory Visit (INDEPENDENT_AMBULATORY_CARE_PROVIDER_SITE_OTHER): Payer: Medicare Other | Admitting: Internal Medicine

## 2018-12-16 DIAGNOSIS — E1151 Type 2 diabetes mellitus with diabetic peripheral angiopathy without gangrene: Secondary | ICD-10-CM

## 2018-12-16 DIAGNOSIS — I4819 Other persistent atrial fibrillation: Secondary | ICD-10-CM | POA: Diagnosis not present

## 2018-12-16 DIAGNOSIS — I10 Essential (primary) hypertension: Secondary | ICD-10-CM

## 2018-12-16 DIAGNOSIS — S81809A Unspecified open wound, unspecified lower leg, initial encounter: Secondary | ICD-10-CM

## 2018-12-16 DIAGNOSIS — E785 Hyperlipidemia, unspecified: Secondary | ICD-10-CM

## 2018-12-16 MED ORDER — INSULIN ASPART PROT & ASPART (70-30 MIX) 100 UNIT/ML PEN: PEN_INJECTOR | SUBCUTANEOUS | 11 refills | Status: DC

## 2018-12-16 NOTE — Assessment & Plan Note (Signed)
Apparently it there are any signs of is healing.  Office visit next week if not healing or if there are any signs of infection

## 2018-12-16 NOTE — Progress Notes (Signed)
Virtual Visit via Telephone Note  I connected with Michael [name] on 12/16/18 at 10:00 AM EDT by telephone and verified that I am speaking with the correct person using two identifiers.   I discussed the limitations, risks, security and privacy concerns of performing an evaluation and management service by telephone and the availability of in person appointments. I also discussed with the patient that there may be a patient responsible charge related to this service. The patient expressed understanding and agreed to proceed.   History of Present Illness:   There is a follow-up on diabetes and hypertension.  The patient continues to run high sugars in a 300 range.  Today his blood sugar is 323, his weight is 162 pounds, his blood pressure is 158/77, his heart rate is 71.  He is noncompliant with diet and he has gained weight according to his wife.  He has a sore on the lab after hitting a door.  He thinks it is healing. No fever, no chest pain, no chills, chronic arthritic pains, no new skin rash Observations/Objective:  He sounds normal on the phone Assessment and Plan:  See plan Follow Up Instructions:    I discussed the assessment and treatment plan with the patient. The patient was provided an opportunity to ask questions and all were answered. The patient agreed with the plan and demonstrated an understanding of the instructions.   The patient was advised to call back or seek an in-person evaluation if the symptoms worsen or if the condition fails to improve as anticipated.  I provided  22 minutes of non-face-to-face time during this encounter.   [name], MD

## 2018-12-16 NOTE — Assessment & Plan Note (Signed)
Rate controlled.  On aspirin and Plavix

## 2018-12-16 NOTE — Assessment & Plan Note (Signed)
Continue with core 

## 2018-12-16 NOTE — Assessment & Plan Note (Signed)
Continue with Cardizem.  Cardizem could be contributing to his feet swelling

## 2018-12-16 NOTE — Assessment & Plan Note (Signed)
Titrate insulin slowly by 1-2 units a day for blood sugars in 120 range.  Improve diet, lose weight

## 2018-12-19 NOTE — Progress Notes (Signed)
[name] [place], [place] [address] Phone: [phone] Subjective:   Michael [name], am serving as a scribe for Dr. Hulan Saas.   CC: Left knee follow-up  [id number]   10/18/2018: Aspiration of the knee Today.  Patient was given a steroid injection as well.  Discussed icing regimen and home exercises, we discussed potential bracing.  I am concerned that there is a vascularity problem as well.  I believe the patient has poor blood flow with peripheral vascular disease.  ABIs ordered but secondary to the coronavirus likely not going to occur anytime soon.  Patient is a high risk individual and we discussed if worsening pain to call.  I would like him to avoid the emergency room unless he feels he drastically needs it.  Patient's wife is in agreement.  Knows that if the pain severe any fevers or chills that they need to seek medical attention immediately.  Patient will try compression, home exercises icing regimen otherwise.  Follow-up with me again in 2 months  Update 12/20/2018: Michael [name] is a 78 y.o. male coming in with complaint of left knee. Does feel somewhat better. Intermittent pain. Does still have pain with stairs and sit to stand. Does have crepitus in left knee.  Patient states that the injections were helpful initially occasional starting to worsen again..  Patient states that he was able to walk for longer distances after this reduction.   Still awaiting lower extremity vascular ultrasound scheduled on the 28th.  Past Medical History:  Diagnosis Date  . Acute on chronic diastolic (congestive) heart failure ([place]) 02/05/2017  . Acute on chronic heart failure ([place])   . Allergy   . Arthritis   . Atrial fibrillation with RVR ([place]) 06/25/2017   hx/notes 06/25/2017  . AVM (arteriovenous malformation) of colon   . AVM (arteriovenous malformation) of small bowel, acquired with hemorrhage 01/20/2012   Found on small bowel capsule  endoscopy, June 2013   . C. difficile enteritis   . CAD (coronary artery disease)    a. Cath 10/2014 - Mild to moderate diagonal, circumflex and obtuse marginal disease. Innominate artery sternosis.  . Carotid artery disease ([place])    a. Carotid dopple 03/2014 22-02% RICA &  54-27% LICA stenosis b. 0/62  . Carotid stenosis 11/06/2013   2014 B moderate On ASA, Plavix, Lipitor  . Chronic diastolic CHF (congestive heart failure) ([place]) 07/27/2016   2017 Diltiazem, Toprol, Furosemide, Hydralazine, Amiodarone  . CKD (chronic kidney disease), stage III ([place])   . Clostridium difficile colitis 05/13/2016  . Coronary artery disease involving native coronary artery of native heart without angina pectoris 07/31/2016   Lopressor - d/c 7/19, Lipitor and ASA  . DM (diabetes mellitus), type 2 with peripheral vascular complications ([place]) 3/76/2831   Chronic on Metformin d/c,  Glipizide 2016 Invokana - stopped On [place] since 2017  . Dyspnea   . Dysrhythmia    ATRIAL FIBRILATION  . Elevated troponin    a. 09/2014 in setting of AF RVR-->Myoview: EF 49%, [name] ischemia/infarct->Med Rx.  . Essential hypertension    On Cardizem, toprol - stopped 7/19, furosemide - trial off acei 06/05/2016 due to doe not fully explained > improved 07/07/2016  On diovan 160 mg daily     . GERD (gastroesophageal reflux disease)   . Gout   . H/O transfusion of whole blood   . Heart murmur   . History of PFTs    PFTs 6/16:  FVC 3.73 (87%), FEV1 2.93 (88%), FEV1/FVC 78%, DLCO 69%  . Hx of adenomatous colonic polyps 07/02/2016  . Hyperlipidemia   . Hypertension   . Hypertriglyceridemia 05/16/2013  . Hypothyroidism   . Iron deficiency anemia   . Nonrheumatic aortic valve stenosis   . NSTEMI (non-ST elevated myocardial infarction) ([place])    hx/notes 06/25/2017  . PAD (peripheral artery disease) ([place]) 07/31/2016   On ASA, Plavix  . PAF (paroxysmal atrial fibrillation) ([place])    a. 09/2014: Converted on Dilt;  b. CHA2DS2VASc = 3-->eliquis;   c. 09/2014 Echo: EF 55-60%, mild LVH, mildly dil LA.  [name] Persistent atrial fibrillation    a. 09/2014: Converted on Dilt;  b. CHA2DS2VASc = 3-->eliquis;  c. 09/2014 Echo: EF 55-60%, mild LVH, mildly dil LA.  2018 stopped Eliquis On amiodarone, cardizem On ASA and Plavix  . Personal history of colonic polyps 2007, 2008   adenoma each time, largest 12 mm in 2007  . Psoriasis   . Septic phlebitis of upper extremity 08/26/2016   2018 L hand Dr Megan Salon f/u Keflex and Vanc po  . Septic thrombophlebitis of upper extremity 07/31/2016  . Staph infection 07/2016   left hand   . Staphylococcus aureus bacteremia 07/31/2016  . Subclavian artery stenosis, right ([place]) 01/16/2015  . Type II diabetes mellitus ([place])    TYPE 2   Past Surgical History:  Procedure Laterality Date  . APPENDECTOMY  02/09/2014  . CARDIAC CATHETERIZATION N/A 07/29/2016   Procedure: Right/Left Heart Cath and Coronary Angiography;  Surgeon: Nelva Bush, MD;  Location: [facility];  Service: Cardiovascular;  Laterality: N/A;  . CARDIAC CATHETERIZATION N/A 07/29/2016   Procedure: Coronary Stent Intervention;  Surgeon: Nelva Bush, MD;  Location: [facility];  Service: Cardiovascular;  Laterality: N/A;  . CARDIAC CATHETERIZATION  10/2014  . COLONOSCOPY  02/10/2011   internal hemorrhoids  . COLONOSCOPY W/ POLYPECTOMY  04/09/2006   12 mm adenoma  . COLONOSCOPY W/ POLYPECTOMY  06/17/2007   5 mm adenoma  . COLONOSCOPY WITH PROPOFOL N/A 05/13/2016   Procedure: COLONOSCOPY WITH PROPOFOL;  Surgeon: Manus Gunning, MD;  Location: [facility];  Service: Gastroenterology;  Laterality: N/A;  . ENTEROSCOPY N/A 08/14/2016   Procedure: ENTEROSCOPY;  Surgeon: Manus Gunning, MD;  Location: [facility];  Service: Gastroenterology;  Laterality: N/A;  . ESOPHAGOGASTRODUODENOSCOPY  12/23/2011   Procedure: ESOPHAGOGASTRODUODENOSCOPY (EGD);  Surgeon: Irene Shipper, MD;  Location: [facility];  Service: Endoscopy;  Laterality:  N/A;  with small bowel bx's  . GIVENS CAPSULE STUDY  12/28/2011  . GIVENS CAPSULE STUDY N/A 08/14/2016   Procedure: GIVENS CAPSULE STUDY;  Surgeon: Manus Gunning, MD;  Location: [facility];  Service: Gastroenterology;  Laterality: N/A;  . KNEE ARTHROSCOPY Right   . LAPAROSCOPIC APPENDECTOMY N/A 02/09/2014   Procedure: APPENDECTOMY LAPAROSCOPIC;  Surgeon: Leighton Ruff, MD;  Location: [facility];  Service: General;  Laterality: N/A;  . LEFT HEART CATHETERIZATION WITH CORONARY ANGIOGRAM N/A 11/15/2014   Procedure: LEFT HEART CATHETERIZATION WITH CORONARY ANGIOGRAM;  Surgeon: Belva Crome, MD;  Location: [facility];  Service: Cardiovascular;  Laterality: N/A;  . SHOULDER OPEN ROTATOR CUFF REPAIR Left    Social History   Socioeconomic History  . Marital status: Married    Spouse name: Not on file  . Number of children: Not on file  . Years of education: 59  . Highest education level: Not on file  Occupational History  . Occupation:  Retired  Scientific laboratory technician  .  Financial resource strain: Not on file  . Food insecurity:    Worry: Not on file    Inability: Not on file  . Transportation needs:    Medical: Not on file    Non-medical: Not on file  Tobacco Use  . Smoking status: Former Smoker    Packs/day: 0.70    Years: 48.00    Pack years: 33.60    Types: Cigarettes    Last attempt to quit: 03/29/2006    Years since quitting: 12.7  . Smokeless tobacco: Never Used  Substance and Sexual Activity  . Alcohol use: Yes    Alcohol/week: 1.0 standard drinks    Types: 1 Cans of beer per week    Comment: occasional alcohol intake  . Drug use: [name]  . Sexual activity: Not Currently  Lifestyle  . Physical activity:    Days per week: Not on file    Minutes per session: Not on file  . Stress: Not on file  Relationships  . Social connections:    Talks on phone: Not on file    Gets together: Not on file    Attends religious service: Not on file    Active member of club or organization: Not  on file    Attends meetings of clubs or organizations: Not on file    Relationship status: Not on file  Other Topics Concern  . Not on file  Social History Narrative   Regular exercise-[name]   Caffeine Use-yes   Allergies  Allergen Reactions  . Percocet [Oxycodone-Acetaminophen] Nausea And Vomiting  . Metformin And Related Nausea Only    Upset stomach  . Invokana [Canagliflozin] Rash   Family History  Problem Relation Age of Onset  . Heart attack Father   . Lung cancer Father   . Diabetes Father   . Stroke Father   . Hypertension Father   . Stroke Mother   . Hypertension Mother   . Cancer Brother        liver  . Heart disease Brother        chf  . COPD Sister   . Malignant hyperthermia Neg Hx     Current Outpatient Medications (Endocrine & Metabolic):  .  glipiZIDE (GLUCOTROL) 10 MG tablet, Take 1 tablet by mouth twice a day before meals .  insulin aspart protamine - aspart (NOVOLOG 70/30 MIX) (70-30) 100 UNIT/ML FlexPen, 34 units sq in am and 22 units sq in pm .  levothyroxine (SYNTHROID, LEVOTHROID) 150 MCG tablet, 1 po q am  Current Outpatient Medications (Cardiovascular):  .  amiodarone (PACERONE) 200 MG tablet, Take one tablet every day except for Sunday. [name]  atorvastatin (LIPITOR) 40 MG tablet, Take 1 tablet by mouth daily .  diltiazem (CARTIA XT) 180 MG 24 hr capsule, Take 1 capsule (180 mg total) by mouth daily. .  fenofibrate 160 MG tablet, Take 1 tablet by mouth daily (Please schedule appointment for future refills) .  furosemide (LASIX) 40 MG tablet, Take 2 tablets (80 mg total) by mouth 2 (two) times daily. 2 tablets po bid. Please make office visit for future refills. .  hydrALAZINE (APRESOLINE) 25 MG tablet, Take 1 tablet (25 mg total) by mouth 3 (three) times daily. .  nitroGLYCERIN (NITROSTAT) 0.4 MG SL tablet, Place 1 tablet under tongue for chest pain. Repeat every 5 minutes as needed, up to 3 doses. If [name] relief after 3 doses, call 911.   Current  Outpatient Medications (Analgesics):  .  allopurinol (ZYLOPRIM) 100 MG  tablet, Take 2 tablets ('200mg'$  total) by mouth daily .  aspirin 81 MG chewable tablet, Chew 1 tablet (81 mg total) by mouth daily. [name]  HYDROcodone-acetaminophen (NORCO/VICODIN) 5-325 MG tablet, Take 1 tablet by mouth 2 (two) times daily as needed for moderate pain.  Current Outpatient Medications (Hematological):  .  ferrous sulfate 325 (65 FE) MG tablet, Take 325 mg by mouth 3 (three) times daily with meals.  Current Outpatient Medications (Other):  .  b complex vitamins tablet, Take 1 tablet by mouth daily. .  Blood Glucose Monitoring Suppl (ONE TOUCH ULTRA 2) W/DEVICE KIT, Use as directed Dx E11.9 .  Blood Pressure Monitor KIT, Use to check blood pressure daily Dx I10 .  Cholecalciferol (VITAMIN D-3) 1000 units CAPS, Take 1,000-5,000 Units by mouth See admin instructions. 1,000 units once a day on Sun/Mon/Tues/Wed/Thurs/Fri and 5,000 units on Sat .  gabapentin (NEURONTIN) 100 MG capsule, Take 1 capsule by mouth twice a day .  Insulin Pen Needle 32G X 4 MM MISC, Use to administer insulin .  Lancets (ONETOUCH ULTRASOFT) lancets, Use to check blood sugars daily .  NEEDLE, DISP, 30 G (BD DISP NEEDLES) 30G X 1/2" MISC, USE WITH 1 SYRINGE SUBCUTANEOUSLY  TWICE DAILY .  omeprazole (PRILOSEC) 40 MG capsule, Take 1 capsule by mouth daily .  ONE TOUCH ULTRA TEST test strip, CHECK BLOOD GLUCOSE (SUGAR) 4 TIMES DAILY .  potassium chloride SA (K-DUR,KLOR-CON) 20 MEQ tablet, Take 1 tablet by mouth daily    Past medical history, social, surgical and family history all reviewed in electronic medical record.  [name] pertanent information unless stated regarding to the chief complaint.   Review of Systems:  [name] headache, visual changes, nausea, vomiting, diarrhea, constipation, dizziness, abdominal pain, skin rash, fevers, chills, night sweats, weight loss, swollen lymph nodes, , chest pain, shortness of breath, mood changes.  Positive muscle  pain, joint swelling  Objective  Blood pressure (!) 148/74, pulse 86, height '5\' 11"'$  (1.803 m), weight 255 lb (115.7 kg), SpO2 98 %.    General: [name] apparent distress alert and oriented x3 mood and affect normal, dressed appropriately.  HEENT: Pupils equal, extraocular movements intact  Respiratory: Patient's speak in full sentences and does not appear short of breath  Cardiovascular: [name] lower extremity edema, non tender, [name] erythema  Skin: Patient is moving extremities bilaterally with 2+ pitting edema. Deposits.  Patient does have a healing wound of the heel on the right heel.  Mild erythema but does not appear to be infected. Abdomen: Soft nontender decreased Neuro: Cranial nerves II through XII are intact, neurovascularly intact in all extremities with 2+ DTRs  Lymph: [name] lymphadenopathy of posterior or anterior cervical chain or axillae bilaterally.  Gait normal with good balance and coordination.  MSK:  Non tender with full range of motion and good stability and symmetric strength and tone of shoulders, elbows, wrist, hip and ankles bilaterally.  Knee: left  valgus deformity noted.  Tender to palpation over medial and PF joint line.  ROM full in flexion and extension and lower leg rotation. instability with valgus force.  painful patellar compression. Patellar glide with moderate crepitus. Patellar and quadriceps tendons unremarkable. Hamstring and quadriceps strength is normal. Contralateral knee shows mild arthritis changes  After informed written and verbal consent, patient was seated on exam table. Left knee was prepped with alcohol swab and utilizing anterolateral approach, patient's left knee space was injected with 4:1  marcaine 0.5%: Kenalog '40mg'$ /dL. Patient tolerated the procedure well without immediate  complications.    Impression and Recommendations:     This case required medical decision making of moderate complexity. The above documentation has been reviewed and is  accurate and complete Lyndal Pulley, DO       Note: This dictation was prepared with Dragon dictation along with smaller phrase technology. Any transcriptional errors that result from this process are unintentional.

## 2018-12-20 ENCOUNTER — Encounter: Payer: Self-pay | Admitting: Family Medicine

## 2018-12-20 ENCOUNTER — Other Ambulatory Visit: Payer: Self-pay

## 2018-12-20 ENCOUNTER — Ambulatory Visit (INDEPENDENT_AMBULATORY_CARE_PROVIDER_SITE_OTHER): Payer: Medicare Other | Admitting: Family Medicine

## 2018-12-20 VITALS — BP 148/74 | HR 86 | Ht 71.0 in | Wt 255.0 lb

## 2018-12-20 DIAGNOSIS — S81809D Unspecified open wound, unspecified lower leg, subsequent encounter: Secondary | ICD-10-CM | POA: Diagnosis not present

## 2018-12-20 DIAGNOSIS — M1712 Unilateral primary osteoarthritis, left knee: Secondary | ICD-10-CM | POA: Diagnosis not present

## 2018-12-20 DIAGNOSIS — I739 Peripheral vascular disease, unspecified: Secondary | ICD-10-CM | POA: Diagnosis not present

## 2018-12-20 DIAGNOSIS — I2510 Atherosclerotic heart disease of native coronary artery without angina pectoris: Secondary | ICD-10-CM

## 2018-12-20 DIAGNOSIS — L97419 Non-pressure chronic ulcer of right heel and midfoot with unspecified severity: Secondary | ICD-10-CM | POA: Diagnosis not present

## 2018-12-20 NOTE — Assessment & Plan Note (Signed)
Right heel at this time.  Encourage patient to follow-up with wound care.  Referral placed today.  Does not appear to be infected but I do believe secondary to patient's peripheral vascular disease this could be slow healing.

## 2018-12-20 NOTE — Assessment & Plan Note (Signed)
Repeat injection given today.  Patient did have initial improvement.  We encouraged him to monitor blood sugars on a more regular basis.  Could be a candidate for Visco supplementation.  Moderate to severe but I do feel that underlying peripheral vascular disease is also contributing.  Still awaiting the vascular ultrasound that is scheduled in 2 days.  Patient is to increase activity slowly.  Follow-up with me again in 6 weeks and see if we will have approval for Visco supplementation.

## 2018-12-20 NOTE — Assessment & Plan Note (Signed)
Awaiting Dopplers at this point.

## 2018-12-20 NOTE — Patient Instructions (Signed)
Good to se eyou  Be safe Ice 20 minutes 2 times daily. Usually after activity and before bed. .Lets see how much the blood supply is playing a role.  INjected the knee  Will get approval for gel injection  See me again in 6 weeks

## 2018-12-22 ENCOUNTER — Ambulatory Visit (HOSPITAL_COMMUNITY)
Admission: RE | Admit: 2018-12-22 | Discharge: 2018-12-22 | Disposition: A | Discharge status: Home / Self Care | Payer: Medicare Other | Source: Ambulatory Visit | Attending: Cardiology | Admitting: Cardiology

## 2018-12-22 ENCOUNTER — Other Ambulatory Visit (HOSPITAL_COMMUNITY): Payer: Self-pay | Admitting: Family Medicine

## 2018-12-22 ENCOUNTER — Other Ambulatory Visit: Payer: Self-pay

## 2018-12-22 DIAGNOSIS — I739 Peripheral vascular disease, unspecified: Secondary | ICD-10-CM

## 2018-12-22 DIAGNOSIS — G8929 Other chronic pain: Secondary | ICD-10-CM

## 2018-12-22 DIAGNOSIS — M25562 Pain in left knee: Secondary | ICD-10-CM

## 2018-12-27 ENCOUNTER — Telehealth: Payer: Self-pay | Admitting: Internal Medicine

## 2018-12-27 MED ORDER — GLUCOSE BLOOD VI STRP: ORAL_STRIP | 3 refills | Status: DC

## 2018-12-27 NOTE — Telephone Encounter (Signed)
RX sent

## 2018-12-27 NOTE — Telephone Encounter (Signed)
For Medicare to cover the RX needs to state the pt uses insulin. This was just sent but note had requested on the script it's self must state "pt on insulin" Requesting   Resend

## 2018-12-27 NOTE — Telephone Encounter (Signed)
Copied from [place] [id number] [phone]. Topic: Quick Communication - Rx Refill/Question >> Dec 27, 2018  1:18 PM Margot Ables wrote: Medication: One Touch Ultra Test Strips For Medicare to cover the RX needs to state the pt uses insulin and the pts diagnosis for need  Has the patient contacted their pharmacy? yes Preferred Pharmacy (with phone number or street name): [facility], [place] - [address] [phone] (Phone) [phone] (Fax)

## 2018-12-28 MED ORDER — GLUCOSE BLOOD VI STRP: ORAL_STRIP | 3 refills | Status: AC

## 2018-12-28 NOTE — Telephone Encounter (Signed)
Resent RX

## 2018-12-28 NOTE — Addendum Note (Signed)
Addended by: Karren Cobble on: 12/28/2018 03:59 PM   Modules accepted: Orders

## 2019-01-03 ENCOUNTER — Ambulatory Visit (INDEPENDENT_AMBULATORY_CARE_PROVIDER_SITE_OTHER): Payer: Medicare Other | Admitting: Sports Medicine

## 2019-01-03 ENCOUNTER — Encounter: Payer: Self-pay | Admitting: Sports Medicine

## 2019-01-03 ENCOUNTER — Ambulatory Visit (INDEPENDENT_AMBULATORY_CARE_PROVIDER_SITE_OTHER): Payer: Medicare Other

## 2019-01-03 ENCOUNTER — Other Ambulatory Visit: Payer: Self-pay

## 2019-01-03 VITALS — BP 187/98 | HR 71 | Temp 97.3°F | Resp 16

## 2019-01-03 DIAGNOSIS — I2510 Atherosclerotic heart disease of native coronary artery without angina pectoris: Secondary | ICD-10-CM

## 2019-01-03 DIAGNOSIS — L97419 Non-pressure chronic ulcer of right heel and midfoot with unspecified severity: Secondary | ICD-10-CM

## 2019-01-03 DIAGNOSIS — I10 Essential (primary) hypertension: Secondary | ICD-10-CM

## 2019-01-03 DIAGNOSIS — B351 Tinea unguium: Secondary | ICD-10-CM

## 2019-01-03 DIAGNOSIS — E1165 Type 2 diabetes mellitus with hyperglycemia: Secondary | ICD-10-CM

## 2019-01-03 DIAGNOSIS — I739 Peripheral vascular disease, unspecified: Secondary | ICD-10-CM | POA: Diagnosis not present

## 2019-01-03 DIAGNOSIS — M79672 Pain in left foot: Secondary | ICD-10-CM

## 2019-01-03 MED ORDER — COLLAGENASE 250 UNIT/GM EX OINT: 1.0000 "application " | TOPICAL_OINTMENT | Freq: Every day | CUTANEOUS | 2 refills | Status: DC

## 2019-01-03 NOTE — Progress Notes (Signed)
Subjective: Michael [name] is a 78 y.o. male patient seen in office for evaluation of ulceration of the Right heel that happened 1 month ago after bumping the back of the leg on a door and then him pulling off the skin. Patient has a history of diabetes and a blood glucose level  today of '231mg'$ /dl.   Patient is changing the dressing using antibiotic cream. Reports pain especially at night to the right heel. Denies nausea/fever/vomiting/chills/night sweats/shortness of breath/pain. Patient also admits to an issue of having his left great toenail come off. Old history of trauma to this nail. No pain to this area. Patient has no other pedal complaints at this time.  Review of Systems  Musculoskeletal: Positive for joint pain.  All other systems reviewed and are negative.    Patient Active Problem List   Diagnosis Date Noted  . Open leg wound 12/16/2018  . Degenerative arthritis of left knee 10/18/2018  . Peripheral vascular disease ([place]) 10/18/2018  . Atrial fibrillation with rapid ventricular response ([place]) 06/25/2017  . Atrial fibrillation with RVR ([place]) 06/25/2017  . GERD (gastroesophageal reflux disease) 02/05/2017  . PAF (paroxysmal atrial fibrillation) ([place]) 02/05/2017  . CKD (chronic kidney disease), stage III ([place]) 02/05/2017  . Acute on chronic diastolic (congestive) heart failure ([place]) 02/05/2017  . History of GI bleed 08/26/2016  . Septic phlebitis of upper extremity 08/26/2016  . Coronary artery disease involving native coronary artery of native heart without angina pectoris 07/31/2016  . PAD (peripheral artery disease) ([place]) 07/31/2016  . Septic thrombophlebitis of upper extremity 07/31/2016  . History of Clostridium difficile colitis 07/31/2016  . Nonrheumatic aortic valve stenosis   . Chronic diastolic CHF (congestive heart failure) ([place]) 07/27/2016  . Hx of adenomatous colonic polyps 07/02/2016  . Shortness of breath 06/05/2016  . Hypothyroidism 03/17/2016  .  Noncompliance with diet and medication regimen 12/16/2015  . Frequency-urgency syndrome 03/08/2015  . Subclavian artery stenosis, right ([place]) 01/16/2015  . Cholelithiasis 01/16/2015  . Restless leg syndrome 01/10/2015  . Postherpetic neuralgia 12/18/2014  . Persistent atrial fibrillation ([place])   . Hyperlipidemia   . Essential hypertension   . Psoriasis 03/19/2014  . Vitamin D deficiency 11/13/2013  . Carotid stenosis 11/06/2013  . Psoriatic arthritis ([place]) 11/06/2013  . Hypertriglyceridemia 05/16/2013  . Morbid obesity due to excess calories ([place]) 09/10/2012  . Gout 09/10/2012  . AVM (arteriovenous malformation) of small bowel, acquired with hemorrhage 01/20/2012  . Iron deficiency anemia due to chronic blood loss 01/20/2012  . DM (diabetes mellitus), type 2 with peripheral vascular complications ([place]) 27/51/7001   Current Outpatient Medications on File Prior to Visit  Medication Sig Dispense Refill  . allopurinol (ZYLOPRIM) 100 MG tablet Take 2 tablets ('200mg'$  total) by mouth daily 60 tablet 5  . amiodarone (PACERONE) 200 MG tablet Take one tablet every day except for Sunday. 90 tablet 3  . aspirin 81 MG chewable tablet Chew 1 tablet (81 mg total) by mouth daily. 90 tablet 1  . atorvastatin (LIPITOR) 40 MG tablet Take 1 tablet by mouth daily 90 tablet 3  . b complex vitamins tablet Take 1 tablet by mouth daily. 100 tablet 3  . Blood Glucose Monitoring Suppl (ONE TOUCH ULTRA 2) W/DEVICE KIT Use as directed Dx E11.9 1 each 0  . Blood Pressure Monitor KIT Use to check blood pressure daily Dx I10 1 each 0  . Cholecalciferol (VITAMIN D-3) 1000 units CAPS Take 1,000-5,000 Units by mouth See admin instructions. 1,000 units once a day  on Sun/Mon/Tues/Wed/Thurs/Fri and 5,000 units on Sat    . diltiazem (CARTIA XT) 180 MG 24 hr capsule Take 1 capsule (180 mg total) by mouth daily. 90 capsule 1  . fenofibrate 160 MG tablet Take 1 tablet by mouth daily (Please schedule appointment for future  refills) 90 tablet 1  . ferrous sulfate 325 (65 FE) MG tablet Take 325 mg by mouth 3 (three) times daily with meals.    . furosemide (LASIX) 40 MG tablet Take 2 tablets (80 mg total) by mouth 2 (two) times daily. 2 tablets po bid. Please make office visit for future refills. 360 tablet 0  . gabapentin (NEURONTIN) 100 MG capsule Take 1 capsule by mouth twice a day 180 capsule 3  . glipiZIDE (GLUCOTROL) 10 MG tablet Take 1 tablet by mouth twice a day before meals 180 tablet 3  . glucose blood (ONETOUCH ULTRA) test strip CHECK BLOOD GLUCOSE (SUGAR) 4 TIMES DAILY DX: E11.9, Z79.4 (pt on insulin) 400 each 3  . hydrALAZINE (APRESOLINE) 25 MG tablet Take 1 tablet (25 mg total) by mouth 3 (three) times daily. 270 tablet 3  . HYDROcodone-acetaminophen (NORCO/VICODIN) 5-325 MG tablet Take 1 tablet by mouth 2 (two) times daily as needed for moderate pain. 60 tablet 0  . insulin aspart protamine - aspart (NOVOLOG 70/30 MIX) (70-30) 100 UNIT/ML FlexPen 34 units sq in am and 22 units sq in pm 15 mL 11  . Insulin Pen Needle 32G X 4 MM MISC Use to administer insulin 100 each 11  . Lancets (ONETOUCH ULTRASOFT) lancets Use to check blood sugars daily 30 each 11  . levothyroxine (SYNTHROID, LEVOTHROID) 150 MCG tablet 1 po q am 90 tablet 3  . NEEDLE, DISP, 30 G (BD DISP NEEDLES) 30G X 1/2" MISC USE WITH 1 SYRINGE SUBCUTANEOUSLY  TWICE DAILY 100 each 11  . nitroGLYCERIN (NITROSTAT) 0.4 MG SL tablet Place 1 tablet under tongue for chest pain. Repeat every 5 minutes as needed, up to 3 doses. If no relief after 3 doses, call 911. 25 tablet 11  . omeprazole (PRILOSEC) 40 MG capsule Take 1 capsule by mouth daily 90 capsule 0  . potassium chloride SA (K-DUR,KLOR-CON) 20 MEQ tablet Take 1 tablet by mouth daily 90 tablet 0   No current facility-administered medications on file prior to visit.    Allergies  Allergen Reactions  . Percocet [Oxycodone-Acetaminophen] Nausea And Vomiting  . Metformin And Related Nausea Only     Upset stomach  . Invokana [Canagliflozin] Rash    Recent Results (from the past 2160 hour(s))  Uric acid     Status: None   Collection Time: 10/10/18  9:59 AM  Result Value Ref Range   Uric Acid, Serum 5.2 4.0 - 7.8 mg/dL  Comp Met (CMET)     Status: Abnormal   Collection Time: 10/10/18  9:59 AM  Result Value Ref Range   Sodium 135 135 - 145 mEq/L   Potassium 4.4 3.5 - 5.1 mEq/L   Chloride 101 96 - 112 mEq/L   CO2 25 19 - 32 mEq/L   Glucose, Bld 371 (H) 70 - 99 mg/dL   BUN 29 (H) 6 - 23 mg/dL   Creatinine, Ser 1.89 (H) 0.40 - 1.50 mg/dL   Total Bilirubin 0.8 0.2 - 1.2 mg/dL   Alkaline Phosphatase 37 (L) 39 - 117 U/L   AST 12 0 - 37 U/L   ALT 11 0 - 53 U/L   Total Protein 6.9 6.0 - 8.3 g/dL  Albumin 4.2 3.5 - 5.2 g/dL   Calcium 8.6 8.4 - 10.5 mg/dL   GFR 34.85 (L) >60.00 mL/min  CBC w/Diff     Status: Abnormal   Collection Time: 10/10/18  9:59 AM  Result Value Ref Range   WBC 4.5 4.0 - 10.5 K/uL   RBC 3.74 (L) 4.22 - 5.81 Mil/uL   Hemoglobin 11.6 (L) 13.0 - 17.0 g/dL   HCT 32.6 (L) 39.0 - 52.0 %   MCV 87.1 78.0 - 100.0 fl   MCHC 35.6 30.0 - 36.0 g/dL   RDW 16.9 (H) 11.5 - 15.5 %   Platelets 166.0 150.0 - 400.0 K/uL   Neutrophils Relative % 74.5 43.0 - 77.0 %   Lymphocytes Relative 14.3 12.0 - 46.0 %   Monocytes Relative 6.6 3.0 - 12.0 %   Eosinophils Relative 3.8 0.0 - 5.0 %   Basophils Relative 0.8 0.0 - 3.0 %   Neutro Abs 3.4 1.4 - 7.7 K/uL   Lymphs Abs 0.6 (L) 0.7 - 4.0 K/uL   Monocytes Absolute 0.3 0.1 - 1.0 K/uL   Eosinophils Absolute 0.2 0.0 - 0.7 K/uL   Basophils Absolute 0.0 0.0 - 0.1 K/uL  Basic Metabolic Panel (BMET)     Status: Abnormal   Collection Time: 10/18/18 10:21 AM  Result Value Ref Range   Sodium 134 (L) 135 - 145 mEq/L   Potassium 4.6 3.5 - 5.1 mEq/L   Chloride 100 96 - 112 mEq/L   CO2 25 19 - 32 mEq/L   Glucose, Bld 315 (H) 70 - 99 mg/dL   BUN 30 (H) 6 - 23 mg/dL   Creatinine, Ser 1.91 (H) 0.40 - 1.50 mg/dL   Calcium 8.9 8.4 - 10.5 mg/dL    GFR 34.42 (L) >60.00 mL/min  CBC with Differential/Platelet     Status: Abnormal   Collection Time: 10/18/18 10:23 AM  Result Value Ref Range   WBC 3.8 (L) 4.0 - 10.5 K/uL   RBC 3.54 (L) 4.22 - 5.81 Mil/uL   Hemoglobin 10.9 (L) 13.0 - 17.0 g/dL   HCT 30.5 (L) 39.0 - 52.0 %   MCV 86.3 78.0 - 100.0 fl   MCHC 35.9 30.0 - 36.0 g/dL   RDW 17.6 (H) 11.5 - 15.5 %   Platelets 166.0 150.0 - 400.0 K/uL   Neutrophils Relative % 73.4 43.0 - 77.0 %   Lymphocytes Relative 14.9 12.0 - 46.0 %   Monocytes Relative 6.0 3.0 - 12.0 %   Eosinophils Relative 5.1 (H) 0.0 - 5.0 %   Basophils Relative 0.6 0.0 - 3.0 %   Neutro Abs 2.8 1.4 - 7.7 K/uL   Lymphs Abs 0.6 (L) 0.7 - 4.0 K/uL   Monocytes Absolute 0.2 0.1 - 1.0 K/uL   Eosinophils Absolute 0.2 0.0 - 0.7 K/uL   Basophils Absolute 0.0 0.0 - 0.1 K/uL    Objective: There were no vitals filed for this visit.  General: Patient is awake, alert, oriented x 3 and in no acute distress.  Dermatology: Skin is warm and dry bilateral with a ulceration present  Right heel with eschar difficult to determine the dept. Ulceration measures  2.5x1.5 cm. There is a keratotic border with a dry eschar base. The ulceration does not  probe to bone. There is no malodor, no active drainage, no erythema, no edema. No acute signs of infection.  Short mycotic left 1st toenail with stable dry blood. No acute infection noted.   Vascular: Dorsalis Pedis pulse = 1/4 Bilateral,  Posterior  Tibial pulse = 0/4 Bilateral,  Capillary Fill Time < 5 seconds  Neurologic: Protective sensation diminished bilateral.   Musculosketal: There is mild pain with palpation to ulcerated area. No pain with compression to calves bilateral.   Xrays, Right foot:Severe likely chronic heel spurs and midfoot arthritis. No bony destruction suggestive of osteomyelitis at area of ulcer. No gas in soft tissues.   No results for input(s): GRAMSTAIN, LABORGA in the last 8760 hours.  Assessment and Plan:   Problem List Items Addressed This Visit      Cardiovascular and Mediastinum   Peripheral vascular disease ([place])    Other Visit Diagnoses    Heel ulcer, right, with unspecified severity ([place])    -  Primary   Relevant Orders   DG Foot Complete Right (Completed)   WOUND CULTURE   Onychomycosis       Uncontrolled type 2 diabetes mellitus with hyperglycemia ([facility])       Pain of left heel       Hypertension, unspecified type           -Examined patient and discussed the progression of the wound and treatment alternatives. -Xrays reviewed - Cleansed ulceration, wound culture was obtained will call patient if he requires PO antbiotic -Applied antibiotic and dry sterile dressing and instructed patient to continue with daily dressings at home consisting of santyl as Rx - Advised patient to go to the ER or return to office if the wound worsens or if constitutional symptoms are present. -Recommend offloading when in bed to right heel -Requested the results of the vascular study that he had for review -At no charge filed Left hallux nail and advised patient to refrain from pulling off nail -Advised patient to follow up with PCP about blood pressure STAT  -Patient to return to office in 2 weeks for follow up care and evaluation or sooner if problems arise.  Landis Martins, DPM

## 2019-01-03 NOTE — Patient Instructions (Signed)

## 2019-01-04 ENCOUNTER — Telehealth: Payer: Self-pay | Admitting: *Deleted

## 2019-01-04 ENCOUNTER — Inpatient Hospital Stay (HOSPITAL_COMMUNITY)
Admission: EM | Admit: 2019-01-04 | Discharge: 2019-01-06 | DRG: 291 | Disposition: A | Discharge status: Home / Self Care | Payer: Medicare Other | Attending: Internal Medicine | Admitting: Internal Medicine

## 2019-01-04 ENCOUNTER — Emergency Department (HOSPITAL_COMMUNITY): Payer: Medicare Other

## 2019-01-04 ENCOUNTER — Inpatient Hospital Stay (HOSPITAL_COMMUNITY): Payer: Medicare Other

## 2019-01-04 ENCOUNTER — Other Ambulatory Visit: Payer: Self-pay

## 2019-01-04 ENCOUNTER — Encounter (HOSPITAL_COMMUNITY): Payer: Self-pay | Admitting: Emergency Medicine

## 2019-01-04 DIAGNOSIS — J811 Chronic pulmonary edema: Secondary | ICD-10-CM | POA: Diagnosis not present

## 2019-01-04 DIAGNOSIS — S91301S Unspecified open wound, right foot, sequela: Secondary | ICD-10-CM | POA: Diagnosis not present

## 2019-01-04 DIAGNOSIS — E785 Hyperlipidemia, unspecified: Secondary | ICD-10-CM | POA: Diagnosis present

## 2019-01-04 DIAGNOSIS — R0902 Hypoxemia: Secondary | ICD-10-CM | POA: Diagnosis not present

## 2019-01-04 DIAGNOSIS — Z79899 Other long term (current) drug therapy: Secondary | ICD-10-CM

## 2019-01-04 DIAGNOSIS — Z79891 Long term (current) use of opiate analgesic: Secondary | ICD-10-CM | POA: Diagnosis not present

## 2019-01-04 DIAGNOSIS — Z833 Family history of diabetes mellitus: Secondary | ICD-10-CM | POA: Diagnosis not present

## 2019-01-04 DIAGNOSIS — I4819 Other persistent atrial fibrillation: Secondary | ICD-10-CM | POA: Diagnosis present

## 2019-01-04 DIAGNOSIS — K219 Gastro-esophageal reflux disease without esophagitis: Secondary | ICD-10-CM | POA: Diagnosis present

## 2019-01-04 DIAGNOSIS — E039 Hypothyroidism, unspecified: Secondary | ICD-10-CM | POA: Diagnosis not present

## 2019-01-04 DIAGNOSIS — I130 Hypertensive heart and chronic kidney disease with heart failure and stage 1 through stage 4 chronic kidney disease, or unspecified chronic kidney disease: Principal | ICD-10-CM | POA: Diagnosis present

## 2019-01-04 DIAGNOSIS — Z801 Family history of malignant neoplasm of trachea, bronchus and lung: Secondary | ICD-10-CM

## 2019-01-04 DIAGNOSIS — J810 Acute pulmonary edema: Secondary | ICD-10-CM

## 2019-01-04 DIAGNOSIS — E1122 Type 2 diabetes mellitus with diabetic chronic kidney disease: Secondary | ICD-10-CM | POA: Diagnosis present

## 2019-01-04 DIAGNOSIS — Z794 Long term (current) use of insulin: Secondary | ICD-10-CM | POA: Diagnosis not present

## 2019-01-04 DIAGNOSIS — D631 Anemia in chronic kidney disease: Secondary | ICD-10-CM | POA: Diagnosis present

## 2019-01-04 DIAGNOSIS — Z825 Family history of asthma and other chronic lower respiratory diseases: Secondary | ICD-10-CM | POA: Diagnosis not present

## 2019-01-04 DIAGNOSIS — Z86010 Personal history of colonic polyps: Secondary | ICD-10-CM

## 2019-01-04 DIAGNOSIS — I252 Old myocardial infarction: Secondary | ICD-10-CM | POA: Diagnosis not present

## 2019-01-04 DIAGNOSIS — I10 Essential (primary) hypertension: Secondary | ICD-10-CM | POA: Diagnosis not present

## 2019-01-04 DIAGNOSIS — I509 Heart failure, unspecified: Secondary | ICD-10-CM

## 2019-01-04 DIAGNOSIS — N183 Chronic kidney disease, stage 3 unspecified: Secondary | ICD-10-CM | POA: Diagnosis present

## 2019-01-04 DIAGNOSIS — R0602 Shortness of breath: Secondary | ICD-10-CM | POA: Diagnosis not present

## 2019-01-04 DIAGNOSIS — Z7982 Long term (current) use of aspirin: Secondary | ICD-10-CM

## 2019-01-04 DIAGNOSIS — K5521 Angiodysplasia of colon with hemorrhage: Secondary | ICD-10-CM | POA: Diagnosis present

## 2019-01-04 DIAGNOSIS — Z79890 Hormone replacement therapy (postmenopausal): Secondary | ICD-10-CM

## 2019-01-04 DIAGNOSIS — Z823 Family history of stroke: Secondary | ICD-10-CM

## 2019-01-04 DIAGNOSIS — I5033 Acute on chronic diastolic (congestive) heart failure: Secondary | ICD-10-CM | POA: Diagnosis present

## 2019-01-04 DIAGNOSIS — Z20828 Contact with and (suspected) exposure to other viral communicable diseases: Secondary | ICD-10-CM | POA: Diagnosis not present

## 2019-01-04 DIAGNOSIS — Z885 Allergy status to narcotic agent status: Secondary | ICD-10-CM | POA: Diagnosis not present

## 2019-01-04 DIAGNOSIS — I2510 Atherosclerotic heart disease of native coronary artery without angina pectoris: Secondary | ICD-10-CM | POA: Diagnosis present

## 2019-01-04 DIAGNOSIS — Z888 Allergy status to other drugs, medicaments and biological substances status: Secondary | ICD-10-CM | POA: Diagnosis not present

## 2019-01-04 DIAGNOSIS — Z9049 Acquired absence of other specified parts of digestive tract: Secondary | ICD-10-CM | POA: Diagnosis not present

## 2019-01-04 DIAGNOSIS — Z8249 Family history of ischemic heart disease and other diseases of the circulatory system: Secondary | ICD-10-CM

## 2019-01-04 DIAGNOSIS — E1151 Type 2 diabetes mellitus with diabetic peripheral angiopathy without gangrene: Secondary | ICD-10-CM | POA: Diagnosis present

## 2019-01-04 LAB — CBC WITH DIFFERENTIAL/PLATELET
Abs Immature Granulocytes: 0.01 10*3/uL (ref 0.00–0.07)
Abs Immature Granulocytes: 0.02 10*3/uL (ref 0.00–0.07)
Basophils Absolute: 0 10*3/uL (ref 0.0–0.1)
Basophils Absolute: 0 10*3/uL (ref 0.0–0.1)
Basophils Relative: 0 %
Basophils Relative: 1 %
Eosinophils Absolute: 0.1 10*3/uL (ref 0.0–0.5)
Eosinophils Absolute: 0.1 10*3/uL (ref 0.0–0.5)
Eosinophils Relative: 2 %
Eosinophils Relative: 2 %
HCT: 30.7 % — ABNORMAL LOW (ref 39.0–52.0)
HCT: 30.6 % — ABNORMAL LOW (ref 39.0–52.0)
Hemoglobin: 10.3 g/dL — ABNORMAL LOW (ref 13.0–17.0)
Hemoglobin: 9.9 g/dL — ABNORMAL LOW (ref 13.0–17.0)
Immature Granulocytes: 0 %
Immature Granulocytes: 1 %
Lymphocytes Relative: 9 %
Lymphocytes Relative: 10 %
Lymphs Abs: 0.5 10*3/uL — ABNORMAL LOW (ref 0.7–4.0)
Lymphs Abs: 0.4 10*3/uL — ABNORMAL LOW (ref 0.7–4.0)
MCH: 31.7 pg (ref 26.0–34.0)
MCH: 30.5 pg (ref 26.0–34.0)
MCHC: 33.6 g/dL (ref 30.0–36.0)
MCHC: 32.4 g/dL (ref 30.0–36.0)
MCV: 94.5 fL (ref 80.0–100.0)
MCV: 94.2 fL (ref 80.0–100.0)
Monocytes Absolute: 0.3 10*3/uL (ref 0.1–1.0)
Monocytes Absolute: 0.3 10*3/uL (ref 0.1–1.0)
Monocytes Relative: 6 %
Monocytes Relative: 7 %
Neutro Abs: 4.2 10*3/uL (ref 1.7–7.7)
Neutro Abs: 3.3 10*3/uL (ref 1.7–7.7)
Neutrophils Relative %: 83 %
Neutrophils Relative %: 79 %
Platelets: 169 10*3/uL (ref 150–400)
Platelets: 142 10*3/uL — ABNORMAL LOW (ref 150–400)
RBC: 3.25 MIL/uL — ABNORMAL LOW (ref 4.22–5.81)
RBC: 3.25 MIL/uL — ABNORMAL LOW (ref 4.22–5.81)
RDW: 17.2 % — ABNORMAL HIGH (ref 11.5–15.5)
RDW: 17.2 % — ABNORMAL HIGH (ref 11.5–15.5)
WBC: 5 10*3/uL (ref 4.0–10.5)
WBC: 4.2 10*3/uL (ref 4.0–10.5)
nRBC: 0 % (ref 0.0–0.2)
nRBC: 0 % (ref 0.0–0.2)

## 2019-01-04 LAB — COMPREHENSIVE METABOLIC PANEL
ALT: 22 U/L (ref 0–44)
AST: 23 U/L (ref 15–41)
Albumin: 3.8 g/dL (ref 3.5–5.0)
Alkaline Phosphatase: 38 U/L (ref 38–126)
Anion gap: 5 (ref 5–15)
BUN: 29 mg/dL — ABNORMAL HIGH (ref 8–23)
CO2: 25 mmol/L (ref 22–32)
Calcium: 8 mg/dL — ABNORMAL LOW (ref 8.9–10.3)
Chloride: 107 mmol/L (ref 98–111)
Creatinine, Ser: 1.83 mg/dL — ABNORMAL HIGH (ref 0.61–1.24)
GFR calc Af Amer: 41 mL/min — ABNORMAL LOW (ref >60)
GFR calc non Af Amer: 35 mL/min — ABNORMAL LOW (ref >60)
Glucose, Bld: 278 mg/dL — ABNORMAL HIGH (ref 70–99)
Potassium: 4.3 mmol/L (ref 3.5–5.1)
Sodium: 137 mmol/L (ref 135–145)
Total Bilirubin: 0.8 mg/dL (ref 0.3–1.2)
Total Protein: 6.7 g/dL (ref 6.5–8.1)

## 2019-01-04 LAB — BASIC METABOLIC PANEL
Anion gap: 5 (ref 5–15)
BUN: 31 mg/dL — ABNORMAL HIGH (ref 8–23)
CO2: 24 mmol/L (ref 22–32)
Calcium: 8 mg/dL — ABNORMAL LOW (ref 8.9–10.3)
Chloride: 106 mmol/L (ref 98–111)
Creatinine, Ser: 1.83 mg/dL — ABNORMAL HIGH (ref 0.61–1.24)
GFR calc Af Amer: 41 mL/min — ABNORMAL LOW (ref >60)
GFR calc non Af Amer: 35 mL/min — ABNORMAL LOW (ref >60)
Glucose, Bld: 342 mg/dL — ABNORMAL HIGH (ref 70–99)
Potassium: 4.3 mmol/L (ref 3.5–5.1)
Sodium: 135 mmol/L (ref 135–145)

## 2019-01-04 LAB — TROPONIN I
Troponin I: 0.04 ng/mL (ref <0.03)
Troponin I: 0.04 ng/mL (ref <0.03)
Troponin I: 0.03 ng/mL (ref <0.03)
Troponin I: 0.03 ng/mL (ref <0.03)

## 2019-01-04 LAB — CBG MONITORING, ED
Glucose-Capillary: 347 mg/dL — ABNORMAL HIGH (ref 70–99)
Glucose-Capillary: 249 mg/dL — ABNORMAL HIGH (ref 70–99)
Glucose-Capillary: 237 mg/dL — ABNORMAL HIGH (ref 70–99)

## 2019-01-04 LAB — GLUCOSE, CAPILLARY
Glucose-Capillary: 110 mg/dL — ABNORMAL HIGH (ref 70–99)
Glucose-Capillary: 170 mg/dL — ABNORMAL HIGH (ref 70–99)

## 2019-01-04 LAB — TSH: TSH: 3.67 u[IU]/mL (ref 0.350–4.500)

## 2019-01-04 LAB — ECHOCARDIOGRAM COMPLETE
Height: 71 in
Weight: 4192 oz

## 2019-01-04 LAB — SARS CORONAVIRUS 2 BY RT PCR (HOSPITAL ORDER, PERFORMED IN ~~LOC~~ HOSPITAL LAB): SARS Coronavirus 2: NEGATIVE

## 2019-01-04 LAB — BRAIN NATRIURETIC PEPTIDE: B Natriuretic Peptide: 152.1 pg/mL — ABNORMAL HIGH (ref 0.0–100.0)

## 2019-01-04 LAB — MAGNESIUM: Magnesium: 2.3 mg/dL (ref 1.7–2.4)

## 2019-01-04 MED ORDER — LEVOTHYROXINE SODIUM 50 MCG PO TABS
150.0000 ug | ORAL_TABLET | Freq: Every day | ORAL | Status: DC
  Administered 2019-01-04 – 2019-01-06 (×3): 150 ug via ORAL
  Filled 2019-01-04 (×3): qty 1

## 2019-01-04 MED ORDER — FUROSEMIDE 10 MG/ML IJ SOLN
40.0000 mg | Freq: Two times a day (BID) | INTRAMUSCULAR | Status: DC
  Administered 2019-01-04 (×2): 40 mg via INTRAVENOUS
  Filled 2019-01-04: qty 4

## 2019-01-04 MED ORDER — HYDROCODONE-ACETAMINOPHEN 5-325 MG PO TABS
1.0000 | ORAL_TABLET | Freq: Two times a day (BID) | ORAL | Status: DC | PRN
  Administered 2019-01-04: 1 via ORAL
  Filled 2019-01-04: qty 1

## 2019-01-04 MED ORDER — ASPIRIN 81 MG PO CHEW
81.0000 mg | CHEWABLE_TABLET | Freq: Every day | ORAL | Status: DC
  Administered 2019-01-04 – 2019-01-06 (×3): 81 mg via ORAL
  Filled 2019-01-04 (×3): qty 1

## 2019-01-04 MED ORDER — ATORVASTATIN CALCIUM 40 MG PO TABS
40.0000 mg | ORAL_TABLET | Freq: Every day | ORAL | Status: DC
  Administered 2019-01-04 – 2019-01-06 (×3): 40 mg via ORAL
  Filled 2019-01-04 (×3): qty 1

## 2019-01-04 MED ORDER — FENOFIBRATE 160 MG PO TABS
160.0000 mg | ORAL_TABLET | Freq: Every day | ORAL | Status: DC
  Administered 2019-01-04 – 2019-01-06 (×3): 160 mg via ORAL
  Filled 2019-01-04 (×3): qty 1

## 2019-01-04 MED ORDER — INSULIN ASPART 100 UNIT/ML ~~LOC~~ SOLN
0.0000 [IU] | Freq: Three times a day (TID) | SUBCUTANEOUS | Status: DC
  Administered 2019-01-04 – 2019-01-05 (×3): 3 [IU] via SUBCUTANEOUS
  Administered 2019-01-05 – 2019-01-06 (×3): 2 [IU] via SUBCUTANEOUS

## 2019-01-04 MED ORDER — NITROGLYCERIN 0.4 MG SL SUBL: 0.4000 mg | SUBLINGUAL_TABLET | SUBLINGUAL | Status: DC | PRN

## 2019-01-04 MED ORDER — HYDRALAZINE HCL 25 MG PO TABS
25.0000 mg | ORAL_TABLET | Freq: Three times a day (TID) | ORAL | Status: DC
  Administered 2019-01-04 – 2019-01-05 (×5): 25 mg via ORAL
  Filled 2019-01-04 (×6): qty 1

## 2019-01-04 MED ORDER — ACETAMINOPHEN 325 MG PO TABS
650.0000 mg | ORAL_TABLET | Freq: Four times a day (QID) | ORAL | Status: DC | PRN
  Administered 2019-01-04 – 2019-01-05 (×3): 650 mg via ORAL
  Filled 2019-01-04 (×3): qty 2

## 2019-01-04 MED ORDER — ENOXAPARIN SODIUM 40 MG/0.4ML ~~LOC~~ SOLN
40.0000 mg | SUBCUTANEOUS | Status: DC
  Filled 2019-01-04 (×3): qty 0.4

## 2019-01-04 MED ORDER — ACETAMINOPHEN 650 MG RE SUPP: 650.0000 mg | Freq: Four times a day (QID) | RECTAL | Status: DC | PRN

## 2019-01-04 MED ORDER — POTASSIUM CHLORIDE CRYS ER 20 MEQ PO TBCR
20.0000 meq | EXTENDED_RELEASE_TABLET | Freq: Every day | ORAL | Status: DC
  Administered 2019-01-04 – 2019-01-06 (×3): 20 meq via ORAL
  Filled 2019-01-04 (×3): qty 1

## 2019-01-04 MED ORDER — DILTIAZEM HCL ER COATED BEADS 180 MG PO CP24
180.0000 mg | ORAL_CAPSULE | Freq: Every day | ORAL | Status: DC
  Administered 2019-01-04 – 2019-01-06 (×3): 180 mg via ORAL
  Filled 2019-01-04 (×3): qty 1

## 2019-01-04 MED ORDER — FERROUS SULFATE 325 (65 FE) MG PO TABS
325.0000 mg | ORAL_TABLET | Freq: Three times a day (TID) | ORAL | Status: DC
  Administered 2019-01-04 – 2019-01-06 (×7): 325 mg via ORAL
  Filled 2019-01-04 (×9): qty 1

## 2019-01-04 MED ORDER — ALLOPURINOL 100 MG PO TABS
200.0000 mg | ORAL_TABLET | Freq: Every day | ORAL | Status: DC
  Administered 2019-01-04 – 2019-01-06 (×3): 200 mg via ORAL
  Filled 2019-01-04 (×3): qty 2

## 2019-01-04 MED ORDER — FUROSEMIDE 10 MG/ML IJ SOLN
40.0000 mg | Freq: Once | INTRAMUSCULAR | Status: AC
  Administered 2019-01-04: 40 mg via INTRAVENOUS
  Filled 2019-01-04: qty 4

## 2019-01-04 MED ORDER — INSULIN ASPART PROT & ASPART (70-30 MIX) 100 UNIT/ML ~~LOC~~ SUSP
40.0000 [IU] | Freq: Two times a day (BID) | SUBCUTANEOUS | Status: DC
  Administered 2019-01-04: 10:00:00 40 [IU] via SUBCUTANEOUS
  Filled 2019-01-04 (×2): qty 10

## 2019-01-04 MED ORDER — HYDRALAZINE HCL 20 MG/ML IJ SOLN
10.0000 mg | INTRAMUSCULAR | Status: DC | PRN
  Administered 2019-01-04 – 2019-01-05 (×3): 10 mg via INTRAVENOUS
  Filled 2019-01-04 (×3): qty 1

## 2019-01-04 MED ORDER — GABAPENTIN 100 MG PO CAPS
100.0000 mg | ORAL_CAPSULE | Freq: Two times a day (BID) | ORAL | Status: DC
  Administered 2019-01-04 – 2019-01-06 (×5): 100 mg via ORAL
  Filled 2019-01-04 (×5): qty 1

## 2019-01-04 MED ORDER — AMIODARONE HCL 200 MG PO TABS
200.0000 mg | ORAL_TABLET | ORAL | Status: DC
  Administered 2019-01-04 – 2019-01-06 (×3): 200 mg via ORAL
  Filled 2019-01-04 (×3): qty 1

## 2019-01-04 MED ORDER — INSULIN ASPART PROT & ASPART (70-30 MIX) 100 UNIT/ML PEN: 40.0000 [IU] | PEN_INJECTOR | Freq: Two times a day (BID) | SUBCUTANEOUS | Status: DC

## 2019-01-04 MED ORDER — COLLAGENASE 250 UNIT/GM EX OINT
TOPICAL_OINTMENT | Freq: Every day | CUTANEOUS | Status: DC
  Administered 2019-01-04 – 2019-01-06 (×3): via TOPICAL
  Filled 2019-01-04: qty 90

## 2019-01-04 MED ORDER — SODIUM CHLORIDE 0.9 % IV SOLN
1.0000 g | Freq: Once | INTRAVENOUS | Status: AC
  Administered 2019-01-04: 03:00:00 1 g via INTRAVENOUS
  Filled 2019-01-04: qty 10

## 2019-01-04 MED ORDER — PANTOPRAZOLE SODIUM 40 MG PO TBEC
40.0000 mg | DELAYED_RELEASE_TABLET | Freq: Every day | ORAL | Status: DC
  Administered 2019-01-04 – 2019-01-06 (×3): 40 mg via ORAL
  Filled 2019-01-04 (×3): qty 1

## 2019-01-04 NOTE — H&P (Signed)
History and Physical    [name]:[id number] DOB: 11-10-1940 DOA: 01/04/2019  PCP: Michael Anger, MD  Patient coming from: Home.  Chief Complaint: Shortness of breath.  HPI: Michael [name] is a 78 y.o. male with history of CAD, diastolic CHF, A. fib, chronic kidney disease, anemia presents to the ER because of increasing shortness of breath.  Patient states over the last 2 weeks patient has been having progressive worsening shortness of breath with difficulty lying flat on the bed with orthopnea exertional shortness of breath and increasing abdominal girth and lower extremity edema.  Patient states he increase his Lasix dose twice a day over the last 1 week despite which symptoms have not improved.  Has not had any chest pain productive cough fever or chills.  Recently had followed up with primary care for right foot wound he sustained while closing the door.  ED Course: In the ER on exam patient has elevated JVD lower extremity edema abdominal distention and chest x-ray shows congestive heart failure pattern.  There was a raised retrocardiac shadow concerning for infection but patient denies any productive cough was afebrile no leukocytosis.  Lab work show hemoglobin of 10.3 BNP 152 troponin 0.04.  Patient was given Lasix 40 mg IV and admitted for further management.  Review of Systems: As per HPI, rest all negative.   Past Medical History:  Diagnosis Date  . Acute on chronic diastolic (congestive) heart failure ([place]) 02/05/2017  . Acute on chronic heart failure ([place])   . Allergy   . Arthritis   . Atrial fibrillation with RVR ([place]) 06/25/2017   hx/notes 06/25/2017  . AVM (arteriovenous malformation) of colon   . AVM (arteriovenous malformation) of small bowel, acquired with hemorrhage 01/20/2012   Found on small bowel capsule endoscopy, June 2013   . C. difficile enteritis   . CAD (coronary artery disease)    a. Cath 10/2014 - Mild to moderate diagonal, circumflex and obtuse  marginal disease. Innominate artery sternosis.  . Carotid artery disease ([place])    a. Carotid dopple 03/2014 81-19% RICA &  14-78% LICA stenosis b. 2/95  . Carotid stenosis 11/06/2013   2014 B moderate On ASA, Plavix, Lipitor  . Chronic diastolic CHF (congestive heart failure) ([place]) 07/27/2016   2017 Diltiazem, Toprol, Furosemide, Hydralazine, Amiodarone  . CKD (chronic kidney disease), stage III ([place])   . Clostridium difficile colitis 05/13/2016  . Coronary artery disease involving native coronary artery of native heart without angina pectoris 07/31/2016   Lopressor - d/c 7/19, Lipitor and ASA  . DM (diabetes mellitus), type 2 with peripheral vascular complications ([employer]) 01/14/3085   Chronic on Metformin d/c,  Glipizide 2016 Invokana - stopped On [place] since 2017  . Dyspnea   . Dysrhythmia    ATRIAL FIBRILATION  . Elevated troponin    a. 09/2014 in setting of AF RVR-->Myoview: EF 49%, no ischemia/infarct->Med Rx.  . Essential hypertension    On Cardizem, toprol - stopped 7/19, furosemide - trial off acei 06/05/2016 due to doe not fully explained > improved 07/07/2016  On diovan 160 mg daily     . GERD (gastroesophageal reflux disease)   . Gout   . H/O transfusion of whole blood   . Heart murmur   . History of PFTs    PFTs 6/16:  FVC 3.73 (87%), FEV1 2.93 (88%), FEV1/FVC 78%, DLCO 69%  . Hx of adenomatous colonic polyps 07/02/2016  . Hyperlipidemia   . Hypertension   . Hypertriglyceridemia 05/16/2013  .  Hypothyroidism   . Iron deficiency anemia   . Nonrheumatic aortic valve stenosis   . NSTEMI (non-ST elevated myocardial infarction) ([place])    hx/notes 06/25/2017  . PAD (peripheral artery disease) ([place]) 07/31/2016   On ASA, Plavix  . PAF (paroxysmal atrial fibrillation) ([place])    a. 09/2014: Converted on Dilt;  b. CHA2DS2VASc = 3-->eliquis;  c. 09/2014 Echo: EF 55-60%, mild LVH, mildly dil LA.  [name] Persistent atrial fibrillation    a. 09/2014: Converted on Dilt;  b. CHA2DS2VASc = 3-->eliquis;   c. 09/2014 Echo: EF 55-60%, mild LVH, mildly dil LA.  2018 stopped Eliquis On amiodarone, cardizem On ASA and Plavix  . Personal history of colonic polyps 2007, 2008   adenoma each time, largest 12 mm in 2007  . Psoriasis   . Septic phlebitis of upper extremity 08/26/2016   2018 L hand Dr Megan Salon f/u Keflex and Vanc po  . Septic thrombophlebitis of upper extremity 07/31/2016  . Staph infection 07/2016   left hand   . Staphylococcus aureus bacteremia 07/31/2016  . Subclavian artery stenosis, right ([place]) 01/16/2015  . Type II diabetes mellitus ([place])    TYPE 2    Past Surgical History:  Procedure Laterality Date  . APPENDECTOMY  02/09/2014  . CARDIAC CATHETERIZATION N/A 07/29/2016   Procedure: Right/Left Heart Cath and Coronary Angiography;  Surgeon: Nelva Bush, MD;  Location: [facility];  Service: Cardiovascular;  Laterality: N/A;  . CARDIAC CATHETERIZATION N/A 07/29/2016   Procedure: Coronary Stent Intervention;  Surgeon: Nelva Bush, MD;  Location: [facility];  Service: Cardiovascular;  Laterality: N/A;  . CARDIAC CATHETERIZATION  10/2014  . COLONOSCOPY  02/10/2011   internal hemorrhoids  . COLONOSCOPY W/ POLYPECTOMY  04/09/2006   12 mm adenoma  . COLONOSCOPY W/ POLYPECTOMY  06/17/2007   5 mm adenoma  . COLONOSCOPY WITH PROPOFOL N/A 05/13/2016   Procedure: COLONOSCOPY WITH PROPOFOL;  Surgeon: Manus Gunning, MD;  Location: [facility];  Service: Gastroenterology;  Laterality: N/A;  . ENTEROSCOPY N/A 08/14/2016   Procedure: ENTEROSCOPY;  Surgeon: Manus Gunning, MD;  Location: [facility] ENDOSCOPY;  Service: Gastroenterology;  Laterality: N/A;  . ESOPHAGOGASTRODUODENOSCOPY  12/23/2011   Procedure: ESOPHAGOGASTRODUODENOSCOPY (EGD);  Surgeon: Irene Shipper, MD;  Location: [facility];  Service: Endoscopy;  Laterality: N/A;  with small bowel bx's  . GIVENS CAPSULE STUDY  12/28/2011  . GIVENS CAPSULE STUDY N/A 08/14/2016   Procedure: GIVENS CAPSULE STUDY;  Surgeon:  Manus Gunning, MD;  Location: [facility];  Service: Gastroenterology;  Laterality: N/A;  . KNEE ARTHROSCOPY Right   . LAPAROSCOPIC APPENDECTOMY N/A 02/09/2014   Procedure: APPENDECTOMY LAPAROSCOPIC;  Surgeon: Leighton Ruff, MD;  Location: [facility];  Service: General;  Laterality: N/A;  . LEFT HEART CATHETERIZATION WITH CORONARY ANGIOGRAM N/A 11/15/2014   Procedure: LEFT HEART CATHETERIZATION WITH CORONARY ANGIOGRAM;  Surgeon: Belva Crome, MD;  Location: [facility] C[facility] LAB;  Service: Cardiovascular;  Laterality: N/A;  . SHOULDER OPEN ROTATOR CUFF REPAIR Left      reports that he quit smoking about 12 years ago. His smoking use included cigarettes. He has a 33.60 pack-year smoking history. He has never used smokeless tobacco. He reports current alcohol use of about 1.0 standard drinks of alcohol per week. He reports that he does not use drugs.  Allergies  Allergen Reactions  . Percocet [Oxycodone-Acetaminophen] Nausea And Vomiting  . Metformin And Related Nausea Only    Upset stomach  . Invokana [Canagliflozin] Rash    Family  History  Problem Relation Age of Onset  . Heart attack Father   . Lung cancer Father   . Diabetes Father   . Stroke Father   . Hypertension Father   . Stroke Mother   . Hypertension Mother   . Cancer Brother        liver  . Heart disease Brother        chf  . COPD Sister   . Malignant hyperthermia Neg Hx     Prior to Admission medications   Medication Sig Start Date End Date Taking? Authorizing Provider  allopurinol (ZYLOPRIM) 100 MG tablet Take 2 tablets (248m total) by mouth daily 09/28/18  Yes [name], AEvie Lacks MD  amiodarone (PACERONE) 200 MG tablet Take one tablet every day except for Sunday. 03/18/18  Yes TEvans Lance MD  aspirin 81 MG chewable tablet Chew 1 tablet (81 mg total) by mouth daily. 04/28/17  Yes [name], AEvie Lacks MD  atorvastatin (LIPITOR) 40 MG tablet Take 1 tablet by mouth daily 03/17/18  Yes [name], AEvie Lacks MD  b  complex vitamins tablet Take 1 tablet by mouth daily. 04/14/17  Yes [name], AEvie Lacks MD  Cholecalciferol (VITAMIN D-3) 1000 units CAPS Take 1,000-5,000 Units by mouth See admin instructions. 1,000 units once a day on Sun/Mon/Tues/Wed/Thurs/Fri and 5,000 units on Sat   Yes [provider]  diltiazem (CARTIA XT) 180 MG 24 hr capsule Take 1 capsule (180 mg total) by mouth daily. 08/17/18  Yes NJosue Hector MD  fenofibrate 160 MG tablet Take 1 tablet by mouth daily (Please schedule appointment for future refills) Patient taking differently: Take 160 mg by mouth daily.  09/09/18  Yes NJosue Hector MD  ferrous sulfate 325 (65 FE) MG tablet Take 325 mg by mouth 3 (three) times daily with meals.   Yes [provider]  furosemide (LASIX) 40 MG tablet Take 2 tablets (80 mg total) by mouth 2 (two) times daily. 2 tablets po bid. Please make office visit for future refills. Patient taking differently: Take 40 mg by mouth 2 (two) times daily.  12/08/18  Yes NJosue Hector MD  gabapentin (NEURONTIN) 100 MG capsule Take 1 capsule by mouth twice a day 03/17/18  Yes [name], AEvie Lacks MD  glipiZIDE (GLUCOTROL) 10 MG tablet Take 1 tablet by mouth twice a day before meals Patient taking differently: Take 10 mg by mouth 2 (two) times daily before a meal.  03/17/18  Yes [name], AEvie Lacks MD  hydrALAZINE (APRESOLINE) 25 MG tablet Take 1 tablet (25 mg total) by mouth 3 (three) times daily. 12/07/18  Yes [name], AEvie Lacks MD  HYDROcodone-acetaminophen (NORCO/VICODIN) 5-325 MG tablet Take 1 tablet by mouth 2 (two) times daily as needed for moderate pain. 09/28/18 09/28/19 Yes [name], AEvie Lacks MD  insulin aspart protamine - aspart (NOVOLOG 70/30 MIX) (70-30) 100 UNIT/ML FlexPen 34 units sq in am and 22 units sq in pm Patient taking differently: 40-44 Units. 44 units sq in am and 40 units sq in pm 12/16/18  Yes [name], AEvie Lacks MD  levothyroxine (SYNTHROID, LEVOTHROID) 150 MCG tablet 1 po  q am Patient taking differently: Take 150 mcg by mouth daily before breakfast.  02/25/18  Yes [name], AEvie Lacks MD  nitroGLYCERIN (NITROSTAT) 0.4 MG SL tablet Place 1 tablet under tongue for chest pain. Repeat every 5 minutes as needed, up to 3 doses. If no relief after 3 doses, call 911. Patient taking differently: Place 0.4 mg under the tongue every  5 (five) minutes as needed for chest pain.  03/18/18  Yes Josue Hector, MD  omeprazole (PRILOSEC) 40 MG capsule Take 1 capsule by mouth daily 11/08/17  Yes [name], Evie Lacks, MD  potassium chloride SA (K-DUR,KLOR-CON) 20 MEQ tablet Take 1 tablet by mouth daily 11/08/17  Yes [name], Evie Lacks, MD  Blood Glucose Monitoring Suppl (ONE TOUCH ULTRA 2) W/DEVICE KIT Use as directed Dx E11.9 05/18/14   [name], Evie Lacks, MD  Blood Pressure Monitor KIT Use to check blood pressure daily Dx I10 10/04/15   [name], Evie Lacks, MD  collagenase (SANTYL) ointment Apply 1 application topically daily. 01/03/19   Stover, Titorya, DPM  glucose blood (ONETOUCH ULTRA) test strip CHECK BLOOD GLUCOSE (SUGAR) 4 TIMES DAILY DX: E11.9, Z79.4 (pt on insulin) 12/28/18   [name], Evie Lacks, MD  Insulin Pen Needle 32G X 4 MM MISC Use to administer insulin 09/16/18   [name], Evie Lacks, MD  Lancets (ONETOUCH ULTRASOFT) lancets Use to check blood sugars daily 05/18/14   [name], Evie Lacks, MD  NEEDLE, DISP, 30 G (BD DISP NEEDLES) 30G X 1/2" MISC USE WITH 1 SYRINGE SUBCUTANEOUSLY  TWICE DAILY 09/15/18   [name], Evie Lacks, MD    Physical Exam: Vitals:   01/04/19 0129 01/04/19 0255  BP: (!) 161/67 (!) 177/70  Pulse: 66 70  Resp: (!) 22 10  Temp: 97.6 F (36.4 C)   TempSrc: Oral   SpO2: 100% 97%  Weight: 118.8 kg   Height: 5' 11" (1.803 m)       Constitutional: Moderately built and nourished. Vitals:   01/04/19 0129 01/04/19 0255  BP: (!) 161/67 (!) 177/70  Pulse: 66 70  Resp: (!) 22 10  Temp: 97.6 F (36.4 C)   TempSrc: Oral   SpO2: 100% 97%   Weight: 118.8 kg   Height: 5' 11" (1.803 m)    Eyes: Anicteric no pallor. ENMT: No discharge from the ears eyes nose and mouth. Neck: No mass JVD elevated. Respiratory: No rhonchi or crepitations. Cardiovascular: S1-S2 heard. Abdomen: Soft nontender bowel sounds present. Musculoskeletal: Bilateral lower extremity edema present.  Right foot dressing. Skin: No rash.  Right foot dressing. Neurologic: Alert awake oriented to time place and person.  Moves all extremities. Psychiatric: Appears normal.   Labs on Admission: I have personally reviewed following labs and imaging studies  CBC: Recent Labs  Lab 01/04/19 0142  WBC 5.0  NEUTROABS 4.2  HGB 10.3*  HCT 30.7*  MCV 94.5  PLT 413   Basic Metabolic Panel: Recent Labs  Lab 01/04/19 0142  NA 135  K 4.3  CL 106  CO2 24  GLUCOSE 342*  BUN 31*  CREATININE 1.83*  CALCIUM 8.0*   GFR: Estimated Creatinine Clearance: 45 mL/min (A) (by C-G formula based on SCr of 1.83 mg/dL (H)). Liver Function Tests: No results for input(s): AST, ALT, ALKPHOS, BILITOT, PROT, ALBUMIN in the last 168 hours. No results for input(s): LIPASE, AMYLASE in the last 168 hours. No results for input(s): AMMONIA in the last 168 hours. Coagulation Profile: No results for input(s): INR, PROTIME in the last 168 hours. Cardiac Enzymes: Recent Labs  Lab 01/04/19 0142  TROPONINI 0.04*   BNP (last 3 results) No results for input(s): PROBNP in the last 8760 hours. HbA1C: No results for input(s): HGBA1C in the last 72 hours. CBG: Recent Labs  Lab 01/04/19 0141  GLUCAP 347*   Lipid Profile: No results for input(s): CHOL, HDL, LDLCALC, TRIG, CHOLHDL, LDLDIRECT in the last 72  hours. Thyroid Function Tests: No results for input(s): TSH, T4TOTAL, FREET4, T3FREE, THYROIDAB in the last 72 hours. Anemia Panel: No results for input(s): VITAMINB12, FOLATE, FERRITIN, TIBC, IRON, RETICCTPCT in the last 72 hours. Urine analysis:    Component Value  Date/Time   COLORURINE YELLOW 05/11/2016 1600   APPEARANCEUR CLEAR 05/11/2016 1600   LABSPEC 1.021 05/11/2016 1600   PHURINE 5.5 05/11/2016 1600   GLUCOSEU NEGATIVE 05/11/2016 1600   GLUCOSEU NEGATIVE 11/07/2013 0754   HGBUR NEGATIVE 05/11/2016 1600   BILIRUBINUR NEGATIVE 05/11/2016 1600   BILIRUBINUR neg 03/08/2015 1105   KETONESUR NEGATIVE 05/11/2016 1600   PROTEINUR NEGATIVE 05/11/2016 1600   UROBILINOGEN negative 03/08/2015 1105   UROBILINOGEN 0.2 02/09/2014 1805   NITRITE NEGATIVE 05/11/2016 1600   LEUKOCYTESUR NEGATIVE 05/11/2016 1600   Sepsis Labs: _0 (procalcitonin:4,lacticidven:4) ) Recent Results (from the past 240 hour(s))  SARS Coronavirus 2 (CEPHEID - Performed in [facility] hospital lab), Hosp Order     Status: None   Collection Time: 01/04/19  2:39 AM  Result Value Ref Range Status   SARS Coronavirus 2 NEGATIVE NEGATIVE Final    Comment: (NOTE) If result is NEGATIVE SARS-CoV-2 target nucleic acids are NOT DETECTED. The SARS-CoV-2 RNA is generally detectable in upper and lower  respiratory specimens during the acute phase of infection. The lowest  concentration of SARS-CoV-2 viral copies this assay can detect is 250  copies / mL. A negative result does not preclude SARS-CoV-2 infection  and should not be used as the sole basis for treatment or other  patient management decisions.  A negative result may occur with  improper specimen collection / handling, submission of specimen other  than nasopharyngeal swab, presence of viral mutation(s) within the  areas targeted by this assay, and inadequate number of viral copies  (<250 copies / mL). A negative result must be combined with clinical  observations, patient history, and epidemiological information. If result is POSITIVE SARS-CoV-2 target nucleic acids are DETECTED. The SARS-CoV-2 RNA is generally detectable in upper and lower  respiratory specimens dur ing the acute phase of infection.  Positive   results are indicative of active infection with SARS-CoV-2.  Clinical  correlation with patient history and other diagnostic information is  necessary to determine patient infection status.  Positive results do  not rule out bacterial infection or co-infection with other viruses. If result is PRESUMPTIVE POSTIVE SARS-CoV-2 nucleic acids MAY BE PRESENT.   A presumptive positive result was obtained on the submitted specimen  and confirmed on repeat testing.  While 2019 novel coronavirus  (SARS-CoV-2) nucleic acids may be present in the submitted sample  additional confirmatory testing may be necessary for epidemiological  and / or clinical management purposes  to differentiate between  SARS-CoV-2 and other Sarbecovirus currently known to infect humans.  If clinically indicated additional testing with an alternate test  methodology [id number]) is advised. The SARS-CoV-2 RNA is generally  detectable in upper and lower respiratory sp ecimens during the acute  phase of infection. The expected result is Negative. Fact Sheet for Patients:  StrictlyIdeas.no Fact Sheet for Healthcare Providers: BankingDealers.co.za This test is not yet approved or cleared by the [place] FDA and has been authorized for detection and/or diagnosis of SARS-CoV-2 by FDA under an Emergency Use Authorization (EUA).  This EUA will remain in effect (meaning this test can be used) for the duration of the COVID-19 declaration under Section 564(b)(1) of the Act, 21 U.S.C. section 360bbb-3(b)(1), unless the authorization is terminated or revoked sooner.  Performed at [facility], [place] [address] [place], [place] [address]      Radiological Exams on Admission: Dg Chest Portable 1 View  Result Date: 01/04/2019 CLINICAL DATA:  Shortness of breath EXAM: PORTABLE CHEST 1 VIEW COMPARISON:  12/28/2017 FINDINGS: Cardiac size is enlarged. There is generalized  volume overload. There may be a retrocardiac/left basilar airspace opacity. No pneumothorax. No large pleural effusion. No acute osseous abnormality. There are dense aortic calcifications. The lung volumes are low. IMPRESSION: 1. Mild cardiac enlargement with mild volume overload. 2. Low lung volumes. 3. Possible retrocardiac/left basilar airspace opacity which may represent atelectasis or developing infiltrate. Electronically Signed   By: Constance Holster M.D.   On: 01/04/2019 02:18   Dg Foot Complete Right  Result Date: 01/03/2019 Please see detailed radiograph report in office note.   EKG: Independently reviewed.  Normal sinus rhythm.  Assessment/Plan Principal Problem:   Acute CHF (congestive heart failure) (HCC) Active Problems:   DM (diabetes mellitus), type 2 with peripheral vascular complications (HCC)   AVM (arteriovenous malformation) of small bowel, acquired with hemorrhage   Persistent atrial fibrillation   Essential hypertension   Hypothyroidism   Coronary artery disease involving native coronary artery of native heart without angina pectoris   CKD (chronic kidney disease), stage III (HCC)   Acute on chronic diastolic (congestive) heart failure ([place])    1. Acute on chronic diastolic CHF last EF measured in July 2019 was 60 to 65% with grade 2 diastolic dysfunction.  Patient has been placed on Lasix 40 mg IV every 12.  Closely follow intake output daily weights and metabolic panel. 2. Hypertension uncontrolled -continue patient's home dose of hydralazine Cardizem.  PRN IV hydralazine for systolic more than 301. 3. Diabetes mellitus type 2 on NovoLog 70/30 takes 44 units in the morning and 40 in the night.  I have placed patient on 40 twice daily for now.  Sliding scale coverage. 4. Persistent A. fib on amiodarone Cardizem.  Not on anticoagulation with history of AV malformation GI bleed. 5. Chronic kidney disease stage III creatinine appears to be at baseline. 6. Anemia  likely from renal disease follow CBC. 7. Hypothyroidism on Synthroid. 8. CAD denies any chest pain troponin mildly elevated.  Likely from CHF.  Echo cardiac markers.  On aspirin statins Cardizem. 9. Right leg heel wound will get wound team consult.   DVT prophylaxis: Lovenox. Code Status: Full code. Family Communication: Discussed with patient. Disposition Plan: Home. Consults called: Wound team. Admission status: Inpatient.   Rise Patience MD [facility] Pager [phone].  If 7PM-7AM, please contact night-coverage [email or web address] Password [email or web address]  01/04/2019, 5:04 AM

## 2019-01-04 NOTE — Telephone Encounter (Signed)
Yes that's what I need. You can save a copy for me to review when I am there on Tuesday Thanks Dr. Cannon Kettle

## 2019-01-04 NOTE — ED Triage Notes (Signed)
Per EMS - SOB x 2 weeks, worsen tonight around midnight (orthopnea). Hx CHF.    200/80 - now 152 Nitro x 2 (.4 sl) 20g lt forearm  NSR  25 R on 1 L NS   CBG approx 400 per pt, taken at home

## 2019-01-04 NOTE — Telephone Encounter (Signed)
-----   Message from Landis Martins, [place] sent at 01/03/2019 12:39 PM EDT ----- Regarding: Arterial studies results Patient had studies done with Dr. Ellyn Hack. I cant see any results in chart can you contact them to have the results available to use to make sure there is not an issue with his circulation since he has a heel wound Thanks Dr. Chauncey Cruel

## 2019-01-04 NOTE — ED Notes (Signed)
Pt sitting on edge of bed because "it helps with the breathing". Pt knows to call for assistance if needed. Pt has call bell bell within reach and bed is locked in the lowest position.

## 2019-01-04 NOTE — Telephone Encounter (Signed)
Ok great, I see it. Just didn't know where to look. Thanks so much! -Dr. Cannon Kettle

## 2019-01-04 NOTE — ED Notes (Signed)
Report given to Abby RN.

## 2019-01-04 NOTE — ED Notes (Signed)
Pt provided meal tray

## 2019-01-04 NOTE — ED Triage Notes (Signed)
Pt reports SOB x3 weeks worsen this evening  Breathes easier while sitting up

## 2019-01-04 NOTE — ED Notes (Signed)
ED TO INPATIENT HANDOFF REPORT  Name/Age/Gender Michael [name] 78 y.o. male  Code Status    Code Status Orders  (From admission, onward)         Start     Ordered   01/04/19 0502  Full code  Continuous     01/04/19 0503        Code Status History    Date Active Date Inactive Code Status Order ID Comments User Context   06/25/2017 0049 06/26/2017 2040 Full Code [id number]  Rise Patience, MD ED   02/05/2017 2049 02/07/2017 1534 Full Code [id number]  Ivor Costa, MD ED   12/23/2016 0237 12/24/2016 2105 Full Code [id number]  Etta Quill, DO ED   08/10/2016 2304 08/16/2016 1704 Full Code [id number]  Danford, Suann Larry, MD ED   07/27/2016 0535 08/04/2016 2031 Full Code [id number]  Vianne Bulls, MD ED   05/12/2016 0558 05/13/2016 2028 Full Code [id number]  Rise Patience, MD Inpatient   03/17/2016 2052 03/21/2016 1515 Full Code [id number]  Ivor Costa, MD ED   11/15/2014 1623 11/16/2014 1924 Full Code [id number]  Belva Crome, MD Inpatient   10/11/2014 2031 10/13/2014 1902 Full Code [id number]  Almyra Deforest, [place] Inpatient   02/10/2014 0036 02/12/2014 1533 Full Code [id number]  Leighton Ruff, MD Inpatient   09/10/2012 1842 09/11/2012 1702 Full Code [id number]  Bonnielee Haff, MD Inpatient   12/22/2011 0428 12/23/2011 2037 Full Code [id number]  Marion Downer, RN Inpatient    Advance Directive Documentation     Most Recent Value  Type of Advance Directive  Healthcare Power of Attorney, Living will  Pre-existing out of facility DNR order (yellow form or pink MOST form)  -  "MOST" Form in Place?  -      Home/SNF/Other Home  Chief Complaint Shortness of Breath  Level of Care/Admitting Diagnosis ED Disposition    ED Disposition Condition [place]: [place] [[id number]]  Level of Care: Telemetry [5]  Admit to tele based on following criteria: Acute CHF  Covid Evaluation: N/A  Diagnosis: Acute CHF (congestive heart failure) [facility]) [[id number]]   Admitting Physician: Rise Patience [id number]  Attending Physician: Rise Patience [phone]  Estimated length of stay: past midnight tomorrow  Certification:: I certify this patient will need inpatient services for at least 2 midnights  PT Class (Do Not Modify): Inpatient [101]  PT Acc Code (Do Not Modify): Private [1]       Medical History Past Medical History:  Diagnosis Date  . Acute on chronic diastolic (congestive) heart failure ([place]) 02/05/2017  . Acute on chronic heart failure ([place])   . Allergy   . Arthritis   . Atrial fibrillation with RVR ([place]) 06/25/2017   hx/notes 06/25/2017  . AVM (arteriovenous malformation) of colon   . AVM (arteriovenous malformation) of small bowel, acquired with hemorrhage 01/20/2012   Found on small bowel capsule endoscopy, June 2013   . C. difficile enteritis   . CAD (coronary artery disease)    a. Cath 10/2014 - Mild to moderate diagonal, circumflex and obtuse marginal disease. Innominate artery sternosis.  . Carotid artery disease ([employer])    a. Carotid dopple 03/2014 00-17% RICA &  49-44% LICA stenosis b. 9/67  . Carotid stenosis 11/06/2013   2014 B moderate On ASA, Plavix, Lipitor  . Chronic diastolic CHF (congestive heart failure) ([place]) 07/27/2016   2017 Diltiazem, Toprol, Furosemide, Hydralazine, Amiodarone  . CKD (chronic kidney disease), stage  III ([place])   . Clostridium difficile colitis 05/13/2016  . Coronary artery disease involving native coronary artery of native heart without angina pectoris 07/31/2016   Lopressor - d/c 7/19, Lipitor and ASA  . DM (diabetes mellitus), type 2 with peripheral vascular complications ([place]) 9/32/3557   Chronic on Metformin d/c,  Glipizide 2016 Invokana - stopped On [place] since 2017  . Dyspnea   . Dysrhythmia    ATRIAL FIBRILATION  . Elevated troponin    a. 09/2014 in setting of AF RVR-->Myoview: EF 49%, no ischemia/infarct->Med Rx.  . Essential hypertension    On Cardizem, toprol - stopped 7/19,  furosemide - trial off acei 06/05/2016 due to doe not fully explained > improved 07/07/2016  On diovan 160 mg daily     . GERD (gastroesophageal reflux disease)   . Gout   . H/O transfusion of whole blood   . Heart murmur   . History of PFTs    PFTs 6/16:  FVC 3.73 (87%), FEV1 2.93 (88%), FEV1/FVC 78%, DLCO 69%  . Hx of adenomatous colonic polyps 07/02/2016  . Hyperlipidemia   . Hypertension   . Hypertriglyceridemia 05/16/2013  . Hypothyroidism   . Iron deficiency anemia   . Nonrheumatic aortic valve stenosis   . NSTEMI (non-ST elevated myocardial infarction) ([place])    hx/notes 06/25/2017  . PAD (peripheral artery disease) ([place]) 07/31/2016   On ASA, Plavix  . PAF (paroxysmal atrial fibrillation) ([place])    a. 09/2014: Converted on Dilt;  b. CHA2DS2VASc = 3-->eliquis;  c. 09/2014 Echo: EF 55-60%, mild LVH, mildly dil LA.  [name] Persistent atrial fibrillation    a. 09/2014: Converted on Dilt;  b. CHA2DS2VASc = 3-->eliquis;  c. 09/2014 Echo: EF 55-60%, mild LVH, mildly dil LA.  2018 stopped Eliquis On amiodarone, cardizem On ASA and Plavix  . Personal history of colonic polyps 2007, 2008   adenoma each time, largest 12 mm in 2007  . Psoriasis   . Septic phlebitis of upper extremity 08/26/2016   2018 L hand Dr Megan Salon f/u Keflex and Vanc po  . Septic thrombophlebitis of upper extremity 07/31/2016  . Staph infection 07/2016   left hand   . Staphylococcus aureus bacteremia 07/31/2016  . Subclavian artery stenosis, right ([place]) 01/16/2015  . Type II diabetes mellitus (HCC)    TYPE 2    Allergies Allergies  Allergen Reactions  . Percocet [Oxycodone-Acetaminophen] Nausea And Vomiting  . Metformin And Related Nausea Only    Upset stomach  . Invokana [Canagliflozin] Rash    IV Location/Drains/Wounds Patient Lines/Drains/Airways Status   Active Line/Drains/Airways    Name:   Placement date:   Placement time:   Site:   Days:   Peripheral IV 01/04/19 Left Forearm   01/04/19    0124    Forearm   less  than 1   Incision (Closed) 02/09/14 Abdomen Other (Comment)   02/09/14    2127     1790   Wound / Incision (Open or Dehisced) 07/29/16 Other (Comment) Wrist Left mildly swollen, red warm.   07/29/16    1400    Wrist   889          Labs/Imaging Results for orders placed or performed during the hospital encounter of 01/04/19 (from the past 48 hour(s))  POC CBG, ED     Status: Abnormal   Collection Time: 01/04/19  1:41 AM  Result Value Ref Range   Glucose-Capillary 347 (H) 70 - 99 mg/dL  CBC with Differential/Platelet  Status: Abnormal   Collection Time: 01/04/19  1:42 AM  Result Value Ref Range   WBC 5.0 4.0 - 10.5 K/uL   RBC 3.25 (L) 4.22 - 5.81 MIL/uL   Hemoglobin 10.3 (L) 13.0 - 17.0 g/dL   HCT 30.7 (L) 39.0 - 52.0 %   MCV 94.5 80.0 - 100.0 fL   MCH 31.7 26.0 - 34.0 pg   MCHC 33.6 30.0 - 36.0 g/dL   RDW 17.2 (H) 11.5 - 15.5 %   Platelets 169 150 - 400 K/uL   nRBC 0.0 0.0 - 0.2 %   Neutrophils Relative % 83 %   Neutro Abs 4.2 1.7 - 7.7 K/uL   Lymphocytes Relative 9 %   Lymphs Abs 0.5 (L) 0.7 - 4.0 K/uL   Monocytes Relative 6 %   Monocytes Absolute 0.3 0.1 - 1.0 K/uL   Eosinophils Relative 2 %   Eosinophils Absolute 0.1 0.0 - 0.5 K/uL   Basophils Relative 0 %   Basophils Absolute 0.0 0.0 - 0.1 K/uL   Immature Granulocytes 0 %   Abs Immature Granulocytes 0.01 0.00 - 0.07 K/uL    Comment: Performed at [facility], [place] [address] [place], [place] [address]  Basic metabolic panel     Status: Abnormal   Collection Time: 01/04/19  1:42 AM  Result Value Ref Range   Sodium 135 135 - 145 mmol/L   Potassium 4.3 3.5 - 5.1 mmol/L   Chloride 106 98 - 111 mmol/L   CO2 24 22 - 32 mmol/L   Glucose, Bld 342 (H) 70 - 99 mg/dL   BUN 31 (H) 8 - 23 mg/dL   Creatinine, Ser 1.83 (H) 0.61 - 1.24 mg/dL   Calcium 8.0 (L) 8.9 - 10.3 mg/dL   GFR calc non Af Amer 35 (L) >60 mL/min   GFR calc Af Amer 41 (L) >60 mL/min   Anion gap 5 5 - 15    Comment: Performed at [facility], [place] [address], [place], [place] [address]  Brain natriuretic peptide     Status: Abnormal   Collection Time: 01/04/19  1:42 AM  Result Value Ref Range   B Natriuretic Peptide 152.1 (H) 0.0 - 100.0 pg/mL    Comment: Performed at [facility], [place] [address] [place], [place] [address]  Troponin I - ONCE - STAT     Status: Abnormal   Collection Time: 01/04/19  1:42 AM  Result Value Ref Range   Troponin I 0.04 (HH) <0.03 ng/mL    Comment: CRITICAL RESULT CALLED TO, READ BACK BY AND VERIFIED WITHAngelyn Punt RN [id number] 01/04/19 A NAVARRO Performed at [facility], [place] [address] [place], [place] [address]   SARS Coronavirus 2 (CEPHEID - Performed in [place] hospital lab), Hosp Order     Status: None   Collection Time: 01/04/19  2:39 AM  Result Value Ref Range   SARS Coronavirus 2 NEGATIVE NEGATIVE    Comment: (NOTE) If result is NEGATIVE SARS-CoV-2 target nucleic acids are NOT DETECTED. The SARS-CoV-2 RNA is generally detectable in upper and lower  respiratory specimens during the acute phase of infection. The lowest  concentration of SARS-CoV-2 viral copies this assay can detect is 250  copies / mL. A negative result does not preclude SARS-CoV-2 infection  and should not be used as the sole basis for treatment or other  patient management decisions.  A negative result may occur with  improper specimen collection /  handling, submission of specimen other  than nasopharyngeal swab, presence of viral mutation(s) within the  areas targeted by this assay, and inadequate number of viral copies  (<250 copies / mL). A negative result must be combined with clinical  observations, patient history, and epidemiological information. If result is POSITIVE SARS-CoV-2 target nucleic acids are DETECTED. The SARS-CoV-2 RNA is generally detectable in upper and lower  respiratory specimens dur ing the acute phase of infection.  Positive   results are indicative of active infection with SARS-CoV-2.  Clinical  correlation with patient history and other diagnostic information is  necessary to determine patient infection status.  Positive results do  not rule out bacterial infection or co-infection with other viruses. If result is PRESUMPTIVE POSTIVE SARS-CoV-2 nucleic acids MAY BE PRESENT.   A presumptive positive result was obtained on the submitted specimen  and confirmed on repeat testing.  While 2019 novel coronavirus  (SARS-CoV-2) nucleic acids may be present in the submitted sample  additional confirmatory testing may be necessary for epidemiological  and / or clinical management purposes  to differentiate between  SARS-CoV-2 and other Sarbecovirus currently known to infect humans.  If clinically indicated additional testing with an alternate test  methodology [phone]) is advised. The SARS-CoV-2 RNA is generally  detectable in upper and lower respiratory sp ecimens during the acute  phase of infection. The expected result is Negative. Fact Sheet for Patients:  StrictlyIdeas.no Fact Sheet for Healthcare Providers: BankingDealers.co.za This test is not yet approved or cleared by the [place] FDA and has been authorized for detection and/or diagnosis of SARS-CoV-2 by FDA under an Emergency Use Authorization (EUA).  This EUA will remain in effect (meaning this test can be used) for the duration of the COVID-19 declaration under Section 564(b)(1) of the Act, 21 U.S.C. section 360bbb-3(b)(1), unless the authorization is terminated or revoked sooner. Performed at [facility], [place] [address], [place], [place] [address]   Comprehensive metabolic panel     Status: Abnormal   Collection Time: 01/04/19  5:42 AM  Result Value Ref Range   Sodium 137 135 - 145 mmol/L   Potassium 4.3 3.5 - 5.1 mmol/L   Chloride 107 98 - 111 mmol/L   CO2 25 22 - 32 mmol/L    Glucose, Bld 278 (H) 70 - 99 mg/dL   BUN 29 (H) 8 - 23 mg/dL   Creatinine, Ser 1.83 (H) 0.61 - 1.24 mg/dL   Calcium 8.0 (L) 8.9 - 10.3 mg/dL   Total Protein 6.7 6.5 - 8.1 g/dL   Albumin 3.8 3.5 - 5.0 g/dL   AST 23 15 - 41 U/L   ALT 22 0 - 44 U/L   Alkaline Phosphatase 38 38 - 126 U/L   Total Bilirubin 0.8 0.3 - 1.2 mg/dL   GFR calc non Af Amer 35 (L) >60 mL/min   GFR calc Af Amer 41 (L) >60 mL/min   Anion gap 5 5 - 15    Comment: Performed at [facility], [place] [address] [place], [place] [address]  Magnesium     Status: None   Collection Time: 01/04/19  5:42 AM  Result Value Ref Range   Magnesium 2.3 1.7 - 2.4 mg/dL    Comment: Performed at [facility], [place] [address], [place], [place] [address]  CBC WITH DIFFERENTIAL     Status: Abnormal   Collection Time: 01/04/19  5:42 AM  Result Value Ref Range   WBC 4.2 4.0 -  10.5 K/uL   RBC 3.25 (L) 4.22 - 5.81 MIL/uL   Hemoglobin 9.9 (L) 13.0 - 17.0 g/dL   HCT 30.6 (L) 39.0 - 52.0 %   MCV 94.2 80.0 - 100.0 fL   MCH 30.5 26.0 - 34.0 pg   MCHC 32.4 30.0 - 36.0 g/dL   RDW 17.2 (H) 11.5 - 15.5 %   Platelets 142 (L) 150 - 400 K/uL   nRBC 0.0 0.0 - 0.2 %   Neutrophils Relative % 79 %   Neutro Abs 3.3 1.7 - 7.7 K/uL   Lymphocytes Relative 10 %   Lymphs Abs 0.4 (L) 0.7 - 4.0 K/uL   Monocytes Relative 7 %   Monocytes Absolute 0.3 0.1 - 1.0 K/uL   Eosinophils Relative 2 %   Eosinophils Absolute 0.1 0.0 - 0.5 K/uL   Basophils Relative 1 %   Basophils Absolute 0.0 0.0 - 0.1 K/uL   Immature Granulocytes 1 %   Abs Immature Granulocytes 0.02 0.00 - 0.07 K/uL    Comment: Performed at [facility], [place] [address] [place], [place] [address]  Troponin I - Now Then Q6H     Status: Abnormal   Collection Time: 01/04/19  5:42 AM  Result Value Ref Range   Troponin I 0.04 (HH) <0.03 ng/mL    Comment: CRITICAL VALUE NOTED.  VALUE IS CONSISTENT WITH PREVIOUSLY REPORTED AND CALLED  VALUE. Performed at [facility], [place] [address] [place], [place] [address]   TSH     Status: None   Collection Time: 01/04/19  5:43 AM  Result Value Ref Range   TSH 3.670 0.350 - 4.500 uIU/mL    Comment: Performed by a 3rd Generation assay with a functional sensitivity of <=0.01 uIU/mL. Performed at [facility], [place] [address], [place], [place] [address]   CBG monitoring, ED     Status: Abnormal   Collection Time: 01/04/19  8:19 AM  Result Value Ref Range   Glucose-Capillary 249 (H) 70 - 99 mg/dL  CBG monitoring, ED     Status: Abnormal   Collection Time: 01/04/19 11:43 AM  Result Value Ref Range   Glucose-Capillary 237 (H) 70 - 99 mg/dL  Troponin I - Now Then Q6H     Status: Abnormal   Collection Time: 01/04/19 12:53 PM  Result Value Ref Range   Troponin I 0.03 (HH) <0.03 ng/mL    Comment: CRITICAL VALUE NOTED.  VALUE IS CONSISTENT WITH PREVIOUSLY REPORTED AND CALLED VALUE. Performed at [facility], [place] [address] [place], [place] [address]    Dg Chest Portable 1 View  Result Date: 01/04/2019 CLINICAL DATA:  Shortness of breath EXAM: PORTABLE CHEST 1 VIEW COMPARISON:  12/28/2017 FINDINGS: Cardiac size is enlarged. There is generalized volume overload. There may be a retrocardiac/left basilar airspace opacity. No pneumothorax. No large pleural effusion. No acute osseous abnormality. There are dense aortic calcifications. The lung volumes are low. IMPRESSION: 1. Mild cardiac enlargement with mild volume overload. 2. Low lung volumes. 3. Possible retrocardiac/left basilar airspace opacity which may represent atelectasis or developing infiltrate. Electronically Signed   By: Constance Holster M.D.   On: 01/04/2019 02:18   Dg Foot Complete Right  Result Date: 01/03/2019 Please see detailed radiograph report in office note.   Pending Labs Unresulted Labs (From admission, onward)    Start     Ordered   01/11/19 0500   Creatinine, serum  (enoxaparin (LOVENOX)    CrCl >/= 30 ml/min)  Weekly,   R    Comments:  while on enoxaparin therapy    01/04/19 0503   01/04/19 0503  Troponin I - Now Then Q6H  Now then every 6 hours,   R     01/04/19 0503   01/04/19 0501  CBC  (enoxaparin (LOVENOX)    CrCl >/= 30 ml/min)  Once,   R    Comments:  Baseline for enoxaparin therapy IF NOT ALREADY DRAWN.  Notify MD if PLT < 100 K.    01/04/19 0503          Vitals/Pain Today's Vitals   01/04/19 1230 01/04/19 1300 01/04/19 1330 01/04/19 1400  BP: (!) 156/52 (!) 175/78 (!) 160/62 (!) 133/50  Pulse:  82  82  Resp: 14 (!) 26 20 16   Temp:      TempSrc:      SpO2: 94% 98% 99% 92%  Weight:      Height:        Isolation Precautions No active isolations  Medications Medications  allopurinol (ZYLOPRIM) tablet 200 mg (200 mg Oral Given 01/04/19 0951)  aspirin chewable tablet 81 mg (81 mg Oral Given 01/04/19 1250)  HYDROcodone-acetaminophen (NORCO/VICODIN) 5-325 MG per tablet 1 tablet (has no administration in time range)  amiodarone (PACERONE) tablet 200 mg (200 mg Oral Given 01/04/19 0951)  atorvastatin (LIPITOR) tablet 40 mg (40 mg Oral Given 01/04/19 0951)  diltiazem (CARDIZEM CD) 24 hr capsule 180 mg (180 mg Oral Given 01/04/19 0950)  fenofibrate tablet 160 mg (160 mg Oral Given 01/04/19 0951)  hydrALAZINE (APRESOLINE) tablet 25 mg (25 mg Oral Given 01/04/19 0951)  nitroGLYCERIN (NITROSTAT) SL tablet 0.4 mg (has no administration in time range)  levothyroxine (SYNTHROID) tablet 150 mcg (150 mcg Oral Given 01/04/19 0950)  pantoprazole (PROTONIX) EC tablet 40 mg (40 mg Oral Given 01/04/19 1249)  ferrous sulfate tablet 325 mg (325 mg Oral Given 01/04/19 0950)  gabapentin (NEURONTIN) capsule 100 mg (100 mg Oral Given 01/04/19 1248)  potassium chloride SA (K-DUR) CR tablet 20 mEq (20 mEq Oral Given 01/04/19 1249)  acetaminophen (TYLENOL) tablet 650 mg (has no administration in time range)    Or  acetaminophen (TYLENOL)  suppository 650 mg (has no administration in time range)  insulin aspart (novoLOG) injection 0-9 Units (3 Units Subcutaneous Given 01/04/19 1249)  enoxaparin (LOVENOX) injection 40 mg (has no administration in time range)  hydrALAZINE (APRESOLINE) injection 10 mg (has no administration in time range)  furosemide (LASIX) injection 40 mg (40 mg Intravenous Given 01/04/19 0955)  insulin aspart protamine- aspart (NOVOLOG MIX 70/30) injection 40 Units (40 Units Subcutaneous Given 01/04/19 0952)  cefTRIAXone (ROCEPHIN) 1 g in sodium chloride 0.9 % 100 mL IVPB (0 g Intravenous Stopped 01/04/19 0306)  furosemide (LASIX) injection 40 mg (40 mg Intravenous Given 01/04/19 0312)    Mobility walks

## 2019-01-04 NOTE — ED Notes (Signed)
Bed: EV20 Expected date:  Expected time:  Means of arrival:  Comments: Hold for room 8

## 2019-01-04 NOTE — ED Notes (Signed)
EDP at bedside  

## 2019-01-04 NOTE — ED Notes (Signed)
Palumbo MD made aware of critical Trop 0.04

## 2019-01-04 NOTE — ED Notes (Signed)
X-ray at bedside

## 2019-01-04 NOTE — Consult Note (Signed)
[facility] Nurse wound consult note Reason for Consult: right heel wound Followed by podiatry for this wound. Reported to be related to trauma in the presence of DM Wound type: neuropathic ulceration Pressure Injury POA:NA Measurement: 2cm x 2cm x 0.1cm Wound bed:100% black, slightly flucuant in the center Drainage (amount, consistency, odor) minimal, brown/yellow, slight odor Periwound:hyperkeratosis of the periwound Dressing procedure/placement/frequency: Add enzymatic debridement of the wound bed with Santyl daily. Cover with saline moist gauze, top with dry dressing. Change daily.  Teach patient to apply daily and cover   Discussed POC with patient and bedside nurse.  Re consult if needed, will not follow at this time. Thanks  Riyanshi Wahab [employer], RN,CWOCN, CNS, [place] [phone])

## 2019-01-04 NOTE — ED Notes (Signed)
Bed: WA08 Expected date:  Expected time:  Means of arrival:  Comments: Closing section

## 2019-01-04 NOTE — Progress Notes (Signed)
I have seen and assessed patient and agree with Dr. Moise Boring assessment and plan.  Patient is 78 year old male history of coronary artery disease, chronic diastolic CHF, A. fib, chronic kidney disease, anemia presented with worsening shortness of breath, increasing abdominal girth, lower extremity edema.  Patient increase his home dose Lasix however no significant improvement and admitted for an acute CHF exacerbation.  Patient with some clinical improvement after receiving IV Lasix.  Patient volume overloaded on examination.  Check a 2D echo.  Continue IV Lasix, home regimen of hydralazine and Cardizem.  If 2D echo is abnormal will consult with cardiology for further evaluation and management.  No charge.

## 2019-01-04 NOTE — ED Provider Notes (Signed)
[facility] DEPT Provider Note   CSN: [id number] Arrival date & time: 01/04/19  0107    History   Chief Complaint Chief Complaint  Patient presents with  . Shortness of Breath    HPI Michael [name] is a 78 y.o. male.     The history is provided by the patient.  Shortness of Breath  Severity:  Severe Onset quality:  Gradual Timing:  Constant Progression:  Worsening Chronicity:  Recurrent Context: not URI   Relieved by:  Nothing Worsened by:  Nothing Ineffective treatments:  None tried Associated symptoms: no abdominal pain, no chest pain, no claudication, no cough, no fever, no hemoptysis, no sore throat and no sputum production   Risk factors: no hx of PE/DVT   Patient with CHF who presents with worsening SOB x 3 weeks.  Leg swelling. No cough.  BLE edema.  No covid exposure.    Past Medical History:  Diagnosis Date  . Acute on chronic diastolic (congestive) heart failure ([place]) 02/05/2017  . Acute on chronic heart failure ([place])   . Allergy   . Arthritis   . Atrial fibrillation with RVR ([place]) 06/25/2017   hx/notes 06/25/2017  . AVM (arteriovenous malformation) of colon   . AVM (arteriovenous malformation) of small bowel, acquired with hemorrhage 01/20/2012   Found on small bowel capsule endoscopy, June 2013   . C. difficile enteritis   . CAD (coronary artery disease)    a. Cath 10/2014 - Mild to moderate diagonal, circumflex and obtuse marginal disease. Innominate artery sternosis.  . Carotid artery disease ([place])    a. Carotid dopple 03/2014 88-89% RICA &  16-94% LICA stenosis b. 5/03  . Carotid stenosis 11/06/2013   2014 B moderate On ASA, Plavix, Lipitor  . Chronic diastolic CHF (congestive heart failure) ([place]) 07/27/2016   2017 Diltiazem, Toprol, Furosemide, Hydralazine, Amiodarone  . CKD (chronic kidney disease), stage III ([place])   . Clostridium difficile colitis 05/13/2016  . Coronary artery disease involving native coronary artery of  native heart without angina pectoris 07/31/2016   Lopressor - d/c 7/19, Lipitor and ASA  . DM (diabetes mellitus), type 2 with peripheral vascular complications ([place]) 8/88/2800   Chronic on Metformin d/c,  Glipizide 2016 Invokana - stopped On [place] since 2017  . Dyspnea   . Dysrhythmia    ATRIAL FIBRILATION  . Elevated troponin    a. 09/2014 in setting of AF RVR-->Myoview: EF 49%, no ischemia/infarct->Med Rx.  . Essential hypertension    On Cardizem, toprol - stopped 7/19, furosemide - trial off acei 06/05/2016 due to doe not fully explained > improved 07/07/2016  On diovan 160 mg daily     . GERD (gastroesophageal reflux disease)   . Gout   . H/O transfusion of whole blood   . Heart murmur   . History of PFTs    PFTs 6/16:  FVC 3.73 (87%), FEV1 2.93 (88%), FEV1/FVC 78%, DLCO 69%  . Hx of adenomatous colonic polyps 07/02/2016  . Hyperlipidemia   . Hypertension   . Hypertriglyceridemia 05/16/2013  . Hypothyroidism   . Iron deficiency anemia   . Nonrheumatic aortic valve stenosis   . NSTEMI (non-ST elevated myocardial infarction) ([place])    hx/notes 06/25/2017  . PAD (peripheral artery disease) ([place]) 07/31/2016   On ASA, Plavix  . PAF (paroxysmal atrial fibrillation) ([place])    a. 09/2014: Converted on Dilt;  b. CHA2DS2VASc = 3-->eliquis;  c. 09/2014 Echo: EF 55-60%, mild LVH, mildly dil LA.  [name]  Persistent atrial fibrillation    a. 09/2014: Converted on Dilt;  b. CHA2DS2VASc = 3-->eliquis;  c. 09/2014 Echo: EF 55-60%, mild LVH, mildly dil LA.  2018 stopped Eliquis On amiodarone, cardizem On ASA and Plavix  . Personal history of colonic polyps 2007, 2008   adenoma each time, largest 12 mm in 2007  . Psoriasis   . Septic phlebitis of upper extremity 08/26/2016   2018 L hand Dr Megan Salon f/u Keflex and Vanc po  . Septic thrombophlebitis of upper extremity 07/31/2016  . Staph infection 07/2016   left hand   . Staphylococcus aureus bacteremia 07/31/2016  . Subclavian artery stenosis, right ([place])  01/16/2015  . Type II diabetes mellitus ([place])    TYPE 2    Patient Active Problem List   Diagnosis Date Noted  . Open leg wound 12/16/2018  . Degenerative arthritis of left knee 10/18/2018  . Peripheral vascular disease ([place]) 10/18/2018  . Atrial fibrillation with rapid ventricular response ([place]) 06/25/2017  . Atrial fibrillation with RVR ([place]) 06/25/2017  . GERD (gastroesophageal reflux disease) 02/05/2017  . PAF (paroxysmal atrial fibrillation) ([place]) 02/05/2017  . CKD (chronic kidney disease), stage III ([place]) 02/05/2017  . Acute on chronic diastolic (congestive) heart failure ([place]) 02/05/2017  . History of GI bleed 08/26/2016  . Septic phlebitis of upper extremity 08/26/2016  . Coronary artery disease involving native coronary artery of native heart without angina pectoris 07/31/2016  . PAD (peripheral artery disease) ([place]) 07/31/2016  . Septic thrombophlebitis of upper extremity 07/31/2016  . History of Clostridium difficile colitis 07/31/2016  . Nonrheumatic aortic valve stenosis   . Chronic diastolic CHF (congestive heart failure) ([place]) 07/27/2016  . Hx of adenomatous colonic polyps 07/02/2016  . Shortness of breath 06/05/2016  . Hypothyroidism 03/17/2016  . Noncompliance with diet and medication regimen 12/16/2015  . Frequency-urgency syndrome 03/08/2015  . Subclavian artery stenosis, right ([place]) 01/16/2015  . Cholelithiasis 01/16/2015  . Restless leg syndrome 01/10/2015  . Postherpetic neuralgia 12/18/2014  . Persistent atrial fibrillation ([place])   . Hyperlipidemia   . Essential hypertension   . Psoriasis 03/19/2014  . Vitamin D deficiency 11/13/2013  . Carotid stenosis 11/06/2013  . Psoriatic arthritis ([place]) 11/06/2013  . Hypertriglyceridemia 05/16/2013  . Morbid obesity due to excess calories ([place]) 09/10/2012  . Gout 09/10/2012  . AVM (arteriovenous malformation) of small bowel, acquired with hemorrhage 01/20/2012  . Iron deficiency anemia due to chronic blood loss  01/20/2012  . DM (diabetes mellitus), type 2 with peripheral vascular complications ([place]) 49/70/2637    Past Surgical History:  Procedure Laterality Date  . APPENDECTOMY  02/09/2014  . CARDIAC CATHETERIZATION N/A 07/29/2016   Procedure: Right/Left Heart Cath and Coronary Angiography;  Surgeon: Nelva Bush, MD;  Location: [facility];  Service: Cardiovascular;  Laterality: N/A;  . CARDIAC CATHETERIZATION N/A 07/29/2016   Procedure: Coronary Stent Intervention;  Surgeon: Nelva Bush, MD;  Location: [facility];  Service: Cardiovascular;  Laterality: N/A;  . CARDIAC CATHETERIZATION  10/2014  . COLONOSCOPY  02/10/2011   internal hemorrhoids  . COLONOSCOPY W/ POLYPECTOMY  04/09/2006   12 mm adenoma  . COLONOSCOPY W/ POLYPECTOMY  06/17/2007   5 mm adenoma  . COLONOSCOPY WITH PROPOFOL N/A 05/13/2016   Procedure: COLONOSCOPY WITH PROPOFOL;  Surgeon: Manus Gunning, MD;  Location: [facility] ENDOSCOPY;  Service: Gastroenterology;  Laterality: N/A;  . ENTEROSCOPY N/A 08/14/2016   Procedure: ENTEROSCOPY;  Surgeon: Manus Gunning, MD;  Location: [facility] ENDOSCOPY;  Service: Gastroenterology;  Laterality:  N/A;  . ESOPHAGOGASTRODUODENOSCOPY  12/23/2011   Procedure: ESOPHAGOGASTRODUODENOSCOPY (EGD);  Surgeon: Irene Shipper, MD;  Location: [facility];  Service: Endoscopy;  Laterality: N/A;  with small bowel bx's  . GIVENS CAPSULE STUDY  12/28/2011  . GIVENS CAPSULE STUDY N/A 08/14/2016   Procedure: GIVENS CAPSULE STUDY;  Surgeon: Manus Gunning, MD;  Location: [facility];  Service: Gastroenterology;  Laterality: N/A;  . KNEE ARTHROSCOPY Right   . LAPAROSCOPIC APPENDECTOMY N/A 02/09/2014   Procedure: APPENDECTOMY LAPAROSCOPIC;  Surgeon: Leighton Ruff, MD;  Location: [facility] ORS;  Service: General;  Laterality: N/A;  . LEFT HEART CATHETERIZATION WITH CORONARY ANGIOGRAM N/A 11/15/2014   Procedure: LEFT HEART CATHETERIZATION WITH CORONARY ANGIOGRAM;  Surgeon: Belva Crome, MD;   Location: [facility] C[facility] LAB;  Service: Cardiovascular;  Laterality: N/A;  . SHOULDER OPEN ROTATOR CUFF REPAIR Left         Home Medications    Prior to Admission medications   Medication Sig Start Date End Date Taking? Authorizing Provider  allopurinol (ZYLOPRIM) 100 MG tablet Take 2 tablets (284m total) by mouth daily 09/28/18   [name], AEvie Lacks MD  amiodarone (PACERONE) 200 MG tablet Take one tablet every day except for Sunday. 03/18/18   TEvans Lance MD  aspirin 81 MG chewable tablet Chew 1 tablet (81 mg total) by mouth daily. 04/28/17   [name], AEvie Lacks MD  atorvastatin (LIPITOR) 40 MG tablet Take 1 tablet by mouth daily 03/17/18   [name], AEvie Lacks MD  b complex vitamins tablet Take 1 tablet by mouth daily. 04/14/17   [name], AEvie Lacks MD  Blood Glucose Monitoring Suppl (ONE TOUCH ULTRA 2) W/DEVICE KIT Use as directed Dx E11.9 05/18/14   [name], AEvie Lacks MD  Blood Pressure Monitor KIT Use to check blood pressure daily Dx I10 10/04/15   [name], AEvie Lacks MD  Cholecalciferol (VITAMIN D-3) 1000 units CAPS Take 1,000-5,000 Units by mouth See admin instructions. 1,000 units once a day on Sun/Mon/Tues/Wed/Thurs/Fri and 5,000 units on Sat    [provider]  collagenase (SANTYL) ointment Apply 1 application topically daily. 01/03/19   SLandis Martins DPM  diltiazem (CARTIA XT) 180 MG 24 hr capsule Take 1 capsule (180 mg total) by mouth daily. 08/17/18   NJosue Hector MD  fenofibrate 160 MG tablet Take 1 tablet by mouth daily (Please schedule appointment for future refills) 09/09/18   NJosue Hector MD  ferrous sulfate 325 (65 FE) MG tablet Take 325 mg by mouth 3 (three) times daily with meals.    [provider]  furosemide (LASIX) 40 MG tablet Take 2 tablets (80 mg total) by mouth 2 (two) times daily. 2 tablets po bid. Please make office visit for future refills. 12/08/18   NJosue Hector MD  gabapentin (NEURONTIN) 100 MG capsule Take 1 capsule by  mouth twice a day 03/17/18   [name], AEvie Lacks MD  glipiZIDE (GLUCOTROL) 10 MG tablet Take 1 tablet by mouth twice a day before meals 03/17/18   [name], AEvie Lacks MD  glucose blood (ONETOUCH ULTRA) test strip CHECK BLOOD GLUCOSE (SUGAR) 4 TIMES DAILY DX: E11.9, Z79.4 (pt on insulin) 12/28/18   [name], AEvie Lacks MD  hydrALAZINE (APRESOLINE) 25 MG tablet Take 1 tablet (25 mg total) by mouth 3 (three) times daily. 12/07/18   [name], AEvie Lacks MD  HYDROcodone-acetaminophen (NORCO/VICODIN) 5-325 MG tablet Take 1 tablet by mouth 2 (two) times daily as needed for moderate pain. 09/28/18 09/28/19  [name], AEvie Lacks MD  insulin aspart protamine - aspart (NOVOLOG 70/30 MIX) (70-30) 100 UNIT/ML FlexPen 34 units sq in am and 22 units sq in pm 12/16/18   [name], Evie Lacks, MD  Insulin Pen Needle 32G X 4 MM MISC Use to administer insulin 09/16/18   [name], Evie Lacks, MD  Lancets (ONETOUCH ULTRASOFT) lancets Use to check blood sugars daily 05/18/14   [name], Evie Lacks, MD  levothyroxine (SYNTHROID, LEVOTHROID) 150 MCG tablet 1 po q am 02/25/18   [name], Evie Lacks, MD  NEEDLE, DISP, 30 G (BD DISP NEEDLES) 30G X 1/2" MISC USE WITH 1 SYRINGE SUBCUTANEOUSLY  TWICE DAILY 09/15/18   [name], Evie Lacks, MD  nitroGLYCERIN (NITROSTAT) 0.4 MG SL tablet Place 1 tablet under tongue for chest pain. Repeat every 5 minutes as needed, up to 3 doses. If no relief after 3 doses, call 911. 03/18/18   Josue Hector, MD  omeprazole (PRILOSEC) 40 MG capsule Take 1 capsule by mouth daily 11/08/17   [name], Evie Lacks, MD  potassium chloride SA (K-DUR,KLOR-CON) 20 MEQ tablet Take 1 tablet by mouth daily 11/08/17   [name], Evie Lacks, MD    Family History Family History  Problem Relation Age of Onset  . Heart attack Father   . Lung cancer Father   . Diabetes Father   . Stroke Father   . Hypertension Father   . Stroke Mother   . Hypertension Mother   . Cancer Brother        liver  . Heart disease Brother         chf  . COPD Sister   . Malignant hyperthermia Neg Hx     Social History Social History   Tobacco Use  . Smoking status: Former Smoker    Packs/day: 0.70    Years: 48.00    Pack years: 33.60    Types: Cigarettes    Last attempt to quit: 03/29/2006    Years since quitting: 12.7  . Smokeless tobacco: Never Used  Substance Use Topics  . Alcohol use: Yes    Alcohol/week: 1.0 standard drinks    Types: 1 Cans of beer per week    Comment: occasional alcohol intake  . Drug use: No     Allergies   Percocet [oxycodone-acetaminophen]; Metformin and related; and Invokana [canagliflozin]   Review of Systems Review of Systems  Constitutional: Negative for fever.  HENT: Negative for sore throat.   Respiratory: Positive for shortness of breath. Negative for cough, hemoptysis, sputum production and chest tightness.   Cardiovascular: Positive for leg swelling. Negative for chest pain, palpitations and claudication.  Gastrointestinal: Negative for abdominal pain.     Physical Exam Updated Vital Signs BP (!) 161/67 (BP Location: Right Arm)   Pulse 66   Temp 97.6 F (36.4 C) (Oral)   Resp (!) 22   Ht _0  (1.803 m)   Wt 118.8 kg   SpO2 100%   BMI 36.54 kg/m   Physical Exam Vitals signs and nursing note reviewed.  Constitutional:      Appearance: He is obese. He is not ill-appearing.  HENT:     Head: Normocephalic and atraumatic.     Nose: Nose normal.  Eyes:     Conjunctiva/sclera: Conjunctivae normal.     Pupils: Pupils are equal, round, and reactive to light.  Neck:     Musculoskeletal: Normal range of motion and neck supple.  Cardiovascular:     Rate and Rhythm: Normal rate and regular rhythm.     Pulses:  Normal pulses.     Heart sounds: Normal heart sounds.  Pulmonary:     Effort: Pulmonary effort is normal. No respiratory distress.     Breath sounds: Rales present. No wheezing.  Abdominal:     General: Abdomen is flat. Bowel sounds are normal.      Tenderness: There is no abdominal tenderness.  Musculoskeletal: Normal range of motion.     Right lower leg: Edema present.     Left lower leg: Edema present.  Skin:    General: Skin is warm and dry.     Capillary Refill: Capillary refill takes less than 2 seconds.  Neurological:     General: No focal deficit present.     Mental Status: He is alert and oriented to person, place, and time.  Psychiatric:        Mood and Affect: Mood normal.        Behavior: Behavior normal.      ED Treatments / Results  Labs (all labs ordered are listed, but only abnormal results are displayed) Results for orders placed or performed during the hospital encounter of 01/04/19  CBC with Differential/Platelet  Result Value Ref Range   WBC 5.0 4.0 - 10.5 K/uL   RBC 3.25 (L) 4.22 - 5.81 MIL/uL   Hemoglobin 10.3 (L) 13.0 - 17.0 g/dL   HCT 30.7 (L) 39.0 - 52.0 %   MCV 94.5 80.0 - 100.0 fL   MCH 31.7 26.0 - 34.0 pg   MCHC 33.6 30.0 - 36.0 g/dL   RDW 17.2 (H) 11.5 - 15.5 %   Platelets 169 150 - 400 K/uL   nRBC 0.0 0.0 - 0.2 %   Neutrophils Relative % 83 %   Neutro Abs 4.2 1.7 - 7.7 K/uL   Lymphocytes Relative 9 %   Lymphs Abs 0.5 (L) 0.7 - 4.0 K/uL   Monocytes Relative 6 %   Monocytes Absolute 0.3 0.1 - 1.0 K/uL   Eosinophils Relative 2 %   Eosinophils Absolute 0.1 0.0 - 0.5 K/uL   Basophils Relative 0 %   Basophils Absolute 0.0 0.0 - 0.1 K/uL   Immature Granulocytes 0 %   Abs Immature Granulocytes 0.01 0.00 - 0.07 K/uL  Basic metabolic panel  Result Value Ref Range   Sodium 135 135 - 145 mmol/L   Potassium 4.3 3.5 - 5.1 mmol/L   Chloride 106 98 - 111 mmol/L   CO2 24 22 - 32 mmol/L   Glucose, Bld 342 (H) 70 - 99 mg/dL   BUN 31 (H) 8 - 23 mg/dL   Creatinine, Ser 1.83 (H) 0.61 - 1.24 mg/dL   Calcium 8.0 (L) 8.9 - 10.3 mg/dL   GFR calc non Af Amer 35 (L) >60 mL/min   GFR calc Af Amer 41 (L) >60 mL/min   Anion gap 5 5 - 15  Troponin I - ONCE - STAT  Result Value Ref Range   Troponin I 0.04  (HH) <0.03 ng/mL  POC CBG, ED  Result Value Ref Range   Glucose-Capillary 347 (H) 70 - 99 mg/dL   Dg Chest Portable 1 View  Result Date: 01/04/2019 CLINICAL DATA:  Shortness of breath EXAM: PORTABLE CHEST 1 VIEW COMPARISON:  12/28/2017 FINDINGS: Cardiac size is enlarged. There is generalized volume overload. There may be a retrocardiac/left basilar airspace opacity. No pneumothorax. No large pleural effusion. No acute osseous abnormality. There are dense aortic calcifications. The lung volumes are low. IMPRESSION: 1. Mild cardiac enlargement with mild volume overload. 2. Low  lung volumes. 3. Possible retrocardiac/left basilar airspace opacity which may represent atelectasis or developing infiltrate. Electronically Signed   By: Constance Holster M.D.   On: 01/04/2019 02:18   Dg Foot Complete Right  Result Date: 01/03/2019 Please see detailed radiograph report in office note.  Vas [name] Le Art Seg Multi (segm&le Reynauds)  Result Date: 12/22/2018 LOWER EXTREMITY DOPPLER STUDY Indications: Ulceration, peripheral artery disease, and Patient has a wound on              the right heel. He states that a door shut on him. The wound is              healing with no issues, per patient. Patient denies any              claudication symptoms and no rest pain. High Risk Factors: Hypertension, hyperlipidemia, Diabetes, past history of                    smoking, coronary artery disease.  Performing Technologist: Wilkie Aye RVT  Examination Guidelines: A complete evaluation includes at minimum, Doppler waveform signals and systolic blood pressure reading at the level of bilateral brachial, anterior tibial, and posterior tibial arteries, when vessel segments are accessible. Bilateral testing is considered an integral part of a complete examination. Photoelectric Plethysmograph (PPG) waveforms and toe systolic pressure readings are included as required and additional duplex testing as needed. Limited examinations for  reoccurring indications may be performed as noted.  ABI Findings: +---------+------------------+-----+---------+-------------------------------+ Right    Rt Pressure (mmHg)IndexWaveform Comment                         +---------+------------------+-----+---------+-------------------------------+ Brachial 142                             Known subclavian artery disease +---------+------------------+-----+---------+-------------------------------+ CFA                             triphasic                                +---------+------------------+-----+---------+-------------------------------+ Popliteal                       triphasic                                +---------+------------------+-----+---------+-------------------------------+ ATA      255               1.27 triphasic                                +---------+------------------+-----+---------+-------------------------------+ PTA      255               1.27 triphasic                                +---------+------------------+-----+---------+-------------------------------+ PERO     255               1.27 triphasic                                +---------+------------------+-----+---------+-------------------------------+  Great Toe199               1.00 Normal                                   +---------+------------------+-----+---------+-------------------------------+ +---------+------------------+-----+---------+-------+ Left     Lt Pressure (mmHg)IndexWaveform Comment +---------+------------------+-----+---------+-------+ Brachial 200                                     +---------+------------------+-----+---------+-------+ CFA                             triphasic        +---------+------------------+-----+---------+-------+ Popliteal                       triphasic        +---------+------------------+-----+---------+-------+ ATA      255               1.27  triphasic        +---------+------------------+-----+---------+-------+ PTA      255               1.27 triphasic        +---------+------------------+-----+---------+-------+ PERO     255               1.27 triphasic        +---------+------------------+-----+---------+-------+ Doristine Devoid Toe199               1.00 Normal           +---------+------------------+-----+---------+-------+  Summary: Right: Resting right ankle-brachial index indicates noncompressible right lower extremity arteries.The right toe-brachial index is normal. Left: Resting left ankle-brachial index indicates noncompressible left lower extremity arteries.The left toe-brachial index is normal.  *See table(s) above for measurements and observations.  Electronically signed by Larae Grooms MD on 12/22/2018 at 6:23:38 PM.    Final     EKG EKG Interpretation  Date/Time:  Wednesday January 04 2019 01:35:35 EDT Ventricular Rate:  69 PR Interval:    QRS Duration: 113 QT Interval:  433 QTC Calculation: 464 R Axis:   19 Text Interpretation:  Sinus rhythm Incomplete left bundle branch block Confirmed by Randal Buba, Lunetta Marina ([id number]) on 01/04/2019 2:19:07 AM   Radiology Dg Chest Portable 1 View  Result Date: 01/04/2019 CLINICAL DATA:  Shortness of breath EXAM: PORTABLE CHEST 1 VIEW COMPARISON:  12/28/2017 FINDINGS: Cardiac size is enlarged. There is generalized volume overload. There may be a retrocardiac/left basilar airspace opacity. No pneumothorax. No large pleural effusion. No acute osseous abnormality. There are dense aortic calcifications. The lung volumes are low. IMPRESSION: 1. Mild cardiac enlargement with mild volume overload. 2. Low lung volumes. 3. Possible retrocardiac/left basilar airspace opacity which may represent atelectasis or developing infiltrate. Electronically Signed   By: Constance Holster M.D.   On: 01/04/2019 02:18   Dg Foot Complete Right  Result Date: 01/03/2019 Please see detailed radiograph  report in office note.   Procedures Procedures (including critical care time)  Medications Ordered in ED Medications  cefTRIAXone (ROCEPHIN) 1 g in sodium chloride 0.9 % 100 mL IVPB (has no administration in time range)  furosemide (LASIX) injection 40 mg (has no administration in time range)    Final Clinical Impressions(s) / ED Diagnoses   Pulmonary edema will need admission for medication management  Deklin Bieler, MD 01/04/19 [id number]

## 2019-01-05 DIAGNOSIS — E039 Hypothyroidism, unspecified: Secondary | ICD-10-CM

## 2019-01-05 DIAGNOSIS — S91301S Unspecified open wound, right foot, sequela: Secondary | ICD-10-CM

## 2019-01-05 DIAGNOSIS — I4819 Other persistent atrial fibrillation: Secondary | ICD-10-CM

## 2019-01-05 DIAGNOSIS — N183 Chronic kidney disease, stage 3 (moderate): Secondary | ICD-10-CM

## 2019-01-05 LAB — BASIC METABOLIC PANEL
Anion gap: 9 (ref 5–15)
BUN: 28 mg/dL — ABNORMAL HIGH (ref 8–23)
CO2: 22 mmol/L (ref 22–32)
Calcium: 8.3 mg/dL — ABNORMAL LOW (ref 8.9–10.3)
Chloride: 105 mmol/L (ref 98–111)
Creatinine, Ser: 1.72 mg/dL — ABNORMAL HIGH (ref 0.61–1.24)
GFR calc Af Amer: 44 mL/min — ABNORMAL LOW (ref >60)
GFR calc non Af Amer: 38 mL/min — ABNORMAL LOW (ref >60)
Glucose, Bld: 227 mg/dL — ABNORMAL HIGH (ref 70–99)
Potassium: 4.5 mmol/L (ref 3.5–5.1)
Sodium: 136 mmol/L (ref 135–145)

## 2019-01-05 LAB — CBC
HCT: 31.5 % — ABNORMAL LOW (ref 39.0–52.0)
Hemoglobin: 10.2 g/dL — ABNORMAL LOW (ref 13.0–17.0)
MCH: 30.3 pg (ref 26.0–34.0)
MCHC: 32.4 g/dL (ref 30.0–36.0)
MCV: 93.5 fL (ref 80.0–100.0)
Platelets: 143 10*3/uL — ABNORMAL LOW (ref 150–400)
RBC: 3.37 MIL/uL — ABNORMAL LOW (ref 4.22–5.81)
RDW: 16.8 % — ABNORMAL HIGH (ref 11.5–15.5)
WBC: 4 10*3/uL (ref 4.0–10.5)
nRBC: 0 % (ref 0.0–0.2)

## 2019-01-05 LAB — GLUCOSE, CAPILLARY
Glucose-Capillary: 235 mg/dL — ABNORMAL HIGH (ref 70–99)
Glucose-Capillary: 213 mg/dL — ABNORMAL HIGH (ref 70–99)
Glucose-Capillary: 187 mg/dL — ABNORMAL HIGH (ref 70–99)
Glucose-Capillary: 125 mg/dL — ABNORMAL HIGH (ref 70–99)

## 2019-01-05 LAB — MAGNESIUM: Magnesium: 2.5 mg/dL — ABNORMAL HIGH (ref 1.7–2.4)

## 2019-01-05 MED ORDER — FUROSEMIDE 10 MG/ML IJ SOLN
60.0000 mg | Freq: Two times a day (BID) | INTRAMUSCULAR | Status: DC
  Administered 2019-01-05 – 2019-01-06 (×2): 60 mg via INTRAVENOUS
  Filled 2019-01-05 (×2): qty 6

## 2019-01-05 MED ORDER — INSULIN ASPART PROT & ASPART (70-30 MIX) 100 UNIT/ML ~~LOC~~ SUSP
44.0000 [IU] | Freq: Two times a day (BID) | SUBCUTANEOUS | Status: DC
  Administered 2019-01-05 – 2019-01-06 (×3): 44 [IU] via SUBCUTANEOUS

## 2019-01-05 MED ORDER — FUROSEMIDE 10 MG/ML IJ SOLN
20.0000 mg | Freq: Once | INTRAMUSCULAR | Status: AC
  Administered 2019-01-05: 20 mg via INTRAVENOUS
  Filled 2019-01-05: qty 2

## 2019-01-05 MED ORDER — HYDRALAZINE HCL 50 MG PO TABS
50.0000 mg | ORAL_TABLET | Freq: Three times a day (TID) | ORAL | Status: DC
  Administered 2019-01-05 – 2019-01-06 (×2): 50 mg via ORAL
  Filled 2019-01-05 (×2): qty 1

## 2019-01-05 MED FILL — Collagenase Oint 250 Unit/GM: CUTANEOUS | Qty: 30 | Status: AC

## 2019-01-05 NOTE — Progress Notes (Signed)
Patient ambulated 80 feet on room air. Oxygen saturation sustained 100 % ra. Activity tolerated well. Will continue to monitor.

## 2019-01-05 NOTE — Progress Notes (Signed)
PROGRESS NOTE    HALEEM HANNER  KZS:[id number] DOB: 1940/11/29 DOA: 01/04/2019 PCP: Cassandria Anger, MD   Brief Narrative:  HPI per Dr. Duffy Bruce is a 78 y.o. male with history of CAD, diastolic CHF, A. fib, chronic kidney disease, anemia presents to the ER because of increasing shortness of breath.  Patient states over the last 2 weeks patient has been having progressive worsening shortness of breath with difficulty lying flat on the bed with orthopnea exertional shortness of breath and increasing abdominal girth and lower extremity edema.  Patient states he increase his Lasix dose twice a day over the last 1 week despite which symptoms have not improved.  Has not had any chest pain productive cough fever or chills.  Recently had followed up with primary care for right foot wound he sustained while closing the door.  ED Course: In the ER on exam patient has elevated JVD lower extremity edema abdominal distention and chest x-ray shows congestive heart failure pattern.  There was a raised retrocardiac shadow concerning for infection but patient denies any productive cough was afebrile no leukocytosis.  Lab work show hemoglobin of 10.3 BNP 152 troponin 0.04.  Patient was given Lasix 40 mg IV and admitted for further management.  Assessment & Plan:   Principal Problem:   Acute CHF (congestive heart failure) (HCC) Active Problems:   DM (diabetes mellitus), type 2 with peripheral vascular complications (HCC)   AVM (arteriovenous malformation) of small bowel, acquired with hemorrhage   Persistent atrial fibrillation   Essential hypertension   Hypothyroidism   Coronary artery disease involving native coronary artery of native heart without angina pectoris   CKD (chronic kidney disease), stage III (HCC)   Acute on chronic diastolic (congestive) heart failure (HCC)   Open wound of heel, right, sequela   1 acute on chronic diastolic heart failure Likely secondary to dietary  indiscretion.  Patient states was taking Lasix 40 mg daily however it is noted that patient was to be taking 40 mg twice daily.  Last 2D echo from July 2019 with EF of 60 to 65% with grade 2 diastolic dysfunction.  Patient presented with increasing shortness of breath, lower extremity edema, orthopnea, exertional shortness of breath and increasing abdominal girth.  Patient placed on IV Lasix with clinical improvement.  Urine output not properly recorded as he states patient had 650 cc over the past 24 hours.  Current weight of 258.6 pounds from 263.4 pounds on admission.  2D echo from 01/04/2019 with a EF of 60 to 65%, severely increased LVH, left ventricular diastolic parameters consistent with pseudonormalization.  Cardiac enzymes flattened at 0.03.  Increase Lasix to 60 mg IV every 12 hours.  Strict I's and O's.  Daily weights.  Continue aspirin, Lipitor, Cardizem, hydralazine.  2.  Hypertension Continue Cardizem, hydralazine, Lasix.  Increase hydralazine to 50 mg p.o. every 8 hours.   3.  Hyperlipidemia Continue statin.  4.  Hypothyroidism TSH of 3.670.  Continue home dose Synthroid.  Outpatient follow-up with PCP.  5.  Chronic/persistent A. fib Currently rate controlled on amiodarone and Cardizem.  Not patient not on anticoagulation due to history of AVM/GI bleed.  Continue aspirin 81 mg daily.  6.  Poorly controlled diabetes mellitus type 2 Patient with hemoglobin A1c of 10.0 on 09/08/2018.  CBG of 235 this morning.  Increase Lantus to 44 units twice daily.  Hold oral hypoglycemic agents.  Continue sliding scale insulin.  7.  Chronic kidney disease stage III  Stable.  At baseline.  8.  Coronary artery disease Patient with slightly elevated troponins that have flattened likely secondary to problem #1.  Patient denies any chest pain.  Continue aspirin, statin, Lasix, Cardizem, hydralazine.  Outpatient follow-up.  9.  Right leg heel wound Secondary to trauma.  Patient has been seen by the  wound care nurse.  Continue current wound care.  DVT prophylaxis: Lovenox Code Status: Full Family Communication: Updated wife via telephone. Disposition Plan: Likely home when clinically improved and on oral diuretics hopefully in the next 24 to 48 hours.   Consultants:   None  Procedures:   Chest x-ray 01/04/2019,   plain films of the right foot 01/03/2019  2D echo 01/04/2019  Antimicrobials:   None   Subjective: Patient laying in bed on the telephone.  States shortness of breath improving since admission however not at baseline.  Denies any chest pain.  Per wife patient with dietary indiscretions.  Objective: Vitals:   01/05/19 0550 01/05/19 1051 01/05/19 1438 01/05/19 1550  BP:  (!) 169/70 (!) 131/51 (!) 157/66  Pulse:  67 66 65  Resp:  18 14   Temp:  97.8 F (36.6 C) 98 F (36.7 C)   TempSrc:  Oral Oral   SpO2:  100% 99% 97%  Weight: 117.3 kg     Height:        Intake/Output Summary (Last 24 hours) at 01/05/2019 1650 Last data filed at 01/05/2019 1257 Gross per 24 hour  Intake 596 ml  Output 2075 ml  Net -1479 ml   Filed Weights   01/04/19 0129 01/04/19 1648 01/05/19 0550  Weight: 118.8 kg 119.5 kg 117.3 kg    Examination:  General exam: Appears calm and comfortable  Respiratory system: Some bibasilar crackles, no wheezing, no rhonchi, speaking in full sentences, normal respiratory effort.  Cardiovascular system: S1 & S2 heard, 3/6 SEM.  1+ bilateral lower extremity edema.  Gastrointestinal system: Abdomen is nondistended, soft and nontender. No organomegaly or masses felt. Normal bowel sounds heard. Central nervous system: Alert and oriented. No focal neurological deficits. Extremities: 1+ bilateral lower extremity edema.  Right foot bandaged.  Symmetric 5 x 5 power. Skin: No rashes, lesions or ulcers Psychiatry: Judgement and insight appear normal. Mood & affect appropriate.     Data Reviewed: I have personally reviewed following labs and imaging  studies  CBC: Recent Labs  Lab 01/04/19 0142 01/04/19 0542 01/05/19 0541  WBC 5.0 4.2 4.0  NEUTROABS 4.2 3.3  --   HGB 10.3* 9.9* 10.2*  HCT 30.7* 30.6* 31.5*  MCV 94.5 94.2 93.5  PLT 169 142* 478*   Basic Metabolic Panel: Recent Labs  Lab 01/04/19 0142 01/04/19 0542 01/05/19 0541  NA 135 137 136  K 4.3 4.3 4.5  CL 106 107 105  CO2 24 25 22   GLUCOSE 342* 278* 227*  BUN 31* 29* 28*  CREATININE 1.83* 1.83* 1.72*  CALCIUM 8.0* 8.0* 8.3*  MG  --  2.3 2.5*   GFR: Estimated Creatinine Clearance: 47.6 mL/min (A) (by C-G formula based on SCr of 1.72 mg/dL (H)). Liver Function Tests: Recent Labs  Lab 01/04/19 0542  AST 23  ALT 22  ALKPHOS 38  BILITOT 0.8  PROT 6.7  ALBUMIN 3.8   No results for input(s): LIPASE, AMYLASE in the last 168 hours. No results for input(s): AMMONIA in the last 168 hours. Coagulation Profile: No results for input(s): INR, PROTIME in the last 168 hours. Cardiac Enzymes: Recent Labs  Lab 01/04/19  0142 01/04/19 0542 01/04/19 1253 01/04/19 1655  TROPONINI 0.04* 0.04* 0.03* 0.03*   BNP (last 3 results) No results for input(s): PROBNP in the last 8760 hours. HbA1C: No results for input(s): HGBA1C in the last 72 hours. CBG: Recent Labs  Lab 01/04/19 1143 01/04/19 1646 01/04/19 2148 01/05/19 0729 01/05/19 1148  GLUCAP 237* 110* 170* 235* 213*   Lipid Profile: No results for input(s): CHOL, HDL, LDLCALC, TRIG, CHOLHDL, LDLDIRECT in the last 72 hours. Thyroid Function Tests: Recent Labs    01/04/19 0543  TSH 3.670   Anemia Panel: No results for input(s): VITAMINB12, FOLATE, FERRITIN, TIBC, IRON, RETICCTPCT in the last 72 hours. Sepsis Labs: No results for input(s): PROCALCITON, LATICACIDVEN in the last 168 hours.  Recent Results (from the past 240 hour(s))  WOUND CULTURE     Status: None (Preliminary result)   Collection Time: 01/03/19 11:15 AM   Specimen: Abscess; Wound  Result Value Ref Range Status   MICRO NUMBER:  [id number]  Preliminary   SPECIMEN QUALITY: Adequate  Preliminary   SOURCE: R FOOT, BACK OF HEEL  Preliminary   STATUS: PRELIMINARY  Preliminary   GRAM STAIN:   Preliminary    No white blood cells seen No epithelial cells seen Few Gram positive cocci in pairs Few Gram negative bacilli   RESULT: Culture in progress  Preliminary  SARS Coronavirus 2 (CEPHEID - Performed in [place] hospital lab), Hosp Order     Status: None   Collection Time: 01/04/19  2:39 AM   Specimen: Nasopharyngeal Swab  Result Value Ref Range Status   SARS Coronavirus 2 NEGATIVE NEGATIVE Final    Comment: (NOTE) If result is NEGATIVE SARS-CoV-2 target nucleic acids are NOT DETECTED. The SARS-CoV-2 RNA is generally detectable in upper and lower  respiratory specimens during the acute phase of infection. The lowest  concentration of SARS-CoV-2 viral copies this assay can detect is 250  copies / mL. A negative result does not preclude SARS-CoV-2 infection  and should not be used as the sole basis for treatment or other  patient management decisions.  A negative result may occur with  improper specimen collection / handling, submission of specimen other  than nasopharyngeal swab, presence of viral mutation(s) within the  areas targeted by this assay, and inadequate number of viral copies  (<250 copies / mL). A negative result must be combined with clinical  observations, patient history, and epidemiological information. If result is POSITIVE SARS-CoV-2 target nucleic acids are DETECTED. The SARS-CoV-2 RNA is generally detectable in upper and lower  respiratory specimens dur ing the acute phase of infection.  Positive  results are indicative of active infection with SARS-CoV-2.  Clinical  correlation with patient history and other diagnostic information is  necessary to determine patient infection status.  Positive results do  not rule out bacterial infection or co-infection with other viruses. If result is  PRESUMPTIVE POSTIVE SARS-CoV-2 nucleic acids MAY BE PRESENT.   A presumptive positive result was obtained on the submitted specimen  and confirmed on repeat testing.  While 2019 novel coronavirus  (SARS-CoV-2) nucleic acids may be present in the submitted sample  additional confirmatory testing may be necessary for epidemiological  and / or clinical management purposes  to differentiate between  SARS-CoV-2 and other Sarbecovirus currently known to infect humans.  If clinically indicated additional testing with an alternate test  methodology [phone]) is advised. The SARS-CoV-2 RNA is generally  detectable in upper and lower respiratory sp ecimens during the acute  phase of infection. The expected result is Negative. Fact Sheet for Patients:  StrictlyIdeas.no Fact Sheet for Healthcare Providers: BankingDealers.co.za This test is not yet approved or cleared by the [place] FDA and has been authorized for detection and/or diagnosis of SARS-CoV-2 by FDA under an Emergency Use Authorization (EUA).  This EUA will remain in effect (meaning this test can be used) for the duration of the COVID-19 declaration under Section 564(b)(1) of the Act, 21 U.S.C. section 360bbb-3(b)(1), unless the authorization is terminated or revoked sooner. Performed at [facility], [place] [address], [place], [place] [address]          Radiology Studies: Dg Chest Portable 1 View  Result Date: 01/04/2019 CLINICAL DATA:  Shortness of breath EXAM: PORTABLE CHEST 1 VIEW COMPARISON:  12/28/2017 FINDINGS: Cardiac size is enlarged. There is generalized volume overload. There may be a retrocardiac/left basilar airspace opacity. No pneumothorax. No large pleural effusion. No acute osseous abnormality. There are dense aortic calcifications. The lung volumes are low. IMPRESSION: 1. Mild cardiac enlargement with mild volume overload. 2. Low lung volumes. 3.  Possible retrocardiac/left basilar airspace opacity which may represent atelectasis or developing infiltrate. Electronically Signed   By: Constance Holster M.D.   On: 01/04/2019 02:18        Scheduled Meds: . allopurinol  200 mg Oral Daily  . amiodarone  200 mg Oral Once per day on Mon Tue Wed Thu Fri Sat  . aspirin  81 mg Oral Daily  . atorvastatin  40 mg Oral Daily  . collagenase   Topical Daily  . diltiazem  180 mg Oral Daily  . enoxaparin (LOVENOX) injection  40 mg Subcutaneous Q24H  . fenofibrate  160 mg Oral Daily  . ferrous sulfate  325 mg Oral TID WC  . furosemide  60 mg Intravenous Q12H  . gabapentin  100 mg Oral BID  . hydrALAZINE  25 mg Oral TID  . insulin aspart  0-9 Units Subcutaneous TID WC  . insulin aspart protamine- aspart  44 Units Subcutaneous BID WC  . levothyroxine  150 mcg Oral QAC breakfast  . pantoprazole  40 mg Oral Daily  . potassium chloride SA  20 mEq Oral Daily   Continuous Infusions:   LOS: 1 day    Time spent: 40 minutes    Irine Seal, MD [facility]  If 7PM-7AM, please contact night-coverage [email or web address] 01/05/2019, 4:50 PM

## 2019-01-05 NOTE — Plan of Care (Signed)
Patient able to verbalize why he was admitted to the hospital.

## 2019-01-05 NOTE — Progress Notes (Signed)
RN applied santyl, moist gauze, dry dressing and new wrap to patient's wound to right foot. Educated patient and he verbalized understanding of daily wound care continuing at home.

## 2019-01-06 DIAGNOSIS — I2510 Atherosclerotic heart disease of native coronary artery without angina pectoris: Secondary | ICD-10-CM

## 2019-01-06 LAB — GLUCOSE, CAPILLARY
Glucose-Capillary: 170 mg/dL — ABNORMAL HIGH (ref 70–99)
Glucose-Capillary: 172 mg/dL — ABNORMAL HIGH (ref 70–99)

## 2019-01-06 LAB — CBC WITH DIFFERENTIAL/PLATELET
Abs Immature Granulocytes: 0.03 10*3/uL (ref 0.00–0.07)
Basophils Absolute: 0 10*3/uL (ref 0.0–0.1)
Basophils Relative: 1 %
Eosinophils Absolute: 0.2 10*3/uL (ref 0.0–0.5)
Eosinophils Relative: 4 %
HCT: 33.4 % — ABNORMAL LOW (ref 39.0–52.0)
Hemoglobin: 10.4 g/dL — ABNORMAL LOW (ref 13.0–17.0)
Immature Granulocytes: 1 %
Lymphocytes Relative: 15 %
Lymphs Abs: 0.7 10*3/uL (ref 0.7–4.0)
MCH: 29.5 pg (ref 26.0–34.0)
MCHC: 31.1 g/dL (ref 30.0–36.0)
MCV: 94.6 fL (ref 80.0–100.0)
Monocytes Absolute: 0.4 10*3/uL (ref 0.1–1.0)
Monocytes Relative: 9 %
Neutro Abs: 3.1 10*3/uL (ref 1.7–7.7)
Neutrophils Relative %: 70 %
Platelets: 151 10*3/uL (ref 150–400)
RBC: 3.53 MIL/uL — ABNORMAL LOW (ref 4.22–5.81)
RDW: 16.8 % — ABNORMAL HIGH (ref 11.5–15.5)
WBC: 4.3 10*3/uL (ref 4.0–10.5)
nRBC: 0 % (ref 0.0–0.2)

## 2019-01-06 LAB — BASIC METABOLIC PANEL
Anion gap: 12 (ref 5–15)
BUN: 40 mg/dL — ABNORMAL HIGH (ref 8–23)
CO2: 24 mmol/L (ref 22–32)
Calcium: 8.6 mg/dL — ABNORMAL LOW (ref 8.9–10.3)
Chloride: 103 mmol/L (ref 98–111)
Creatinine, Ser: 2.08 mg/dL — ABNORMAL HIGH (ref 0.61–1.24)
GFR calc Af Amer: 35 mL/min — ABNORMAL LOW (ref >60)
GFR calc non Af Amer: 30 mL/min — ABNORMAL LOW (ref >60)
Glucose, Bld: 155 mg/dL — ABNORMAL HIGH (ref 70–99)
Potassium: 4.7 mmol/L (ref 3.5–5.1)
Sodium: 139 mmol/L (ref 135–145)

## 2019-01-06 MED ORDER — FUROSEMIDE 20 MG PO TABS: 60.0000 mg | ORAL_TABLET | Freq: Two times a day (BID) | ORAL | 0 refills | Status: DC

## 2019-01-06 MED ORDER — HYDRALAZINE HCL 50 MG PO TABS: 50.0000 mg | ORAL_TABLET | Freq: Three times a day (TID) | ORAL | 0 refills | Status: DC

## 2019-01-06 NOTE — Care Management Important Message (Signed)
Important Message  Patient Details IM Letter given to Sharren Bridge SW to present to the Patient Name: Michael [name] MRN: [id number] Date of Birth: [birth year]   Medicare Important Message Given:  Yes    Kerin Salen 01/06/2019, 10:55 AM

## 2019-01-06 NOTE — Discharge Summary (Signed)
Physician Discharge Summary  Michael [name] PXT:[id number] DOB: 12-18-1940 DOA: 01/04/2019  PCP: Cassandria Anger, MD  Admit date: 01/04/2019 Discharge date: 01/06/2019  Time spent: 55 minutes  Recommendations for Outpatient Follow-up:  1. Follow-up with [name], Evie Lacks, MD in 2 weeks.  On follow-up patient will need a basic metabolic profile done to follow-up on electrolytes and renal function.  Patient's blood pressure also need to be reassessed as his hydralazine dose has been increased to 50 mg 3 times daily.  Patient's diabetes will also need to be reassessed at that time. 2. Follow-up with Dr. Johnsie Cancel, cardiology in 2 weeks.  On follow-up patient's heart failure need to be reassessed.  Patient is Lasix have been increased to 60 mg twice daily on discharge.  Patient's hydralazine was also increased to 50 mg 3 times daily.  Patient will need a basic metabolic profile done on follow-up.   Discharge Diagnoses:  Principal Problem:   Acute on chronic diastolic (congestive) heart failure (HCC) Active Problems:   DM (diabetes mellitus), type 2 with peripheral vascular complications (HCC)   AVM (arteriovenous malformation) of small bowel, acquired with hemorrhage   Persistent atrial fibrillation   Essential hypertension   Hypothyroidism   Coronary artery disease involving native coronary artery of native heart without angina pectoris   CKD (chronic kidney disease), stage III (HCC)   Acute CHF (congestive heart failure) ([place])   Open wound of heel, right, sequela   Discharge Condition: Stable and improved  Diet recommendation: Heart healthy  Filed Weights   01/04/19 1648 01/05/19 0550 01/05/19 2100  Weight: 119.5 kg 117.3 kg 116.6 kg    History of present illness:  Per Dr. Duffy [name] is a 78 y.o. male with history of CAD, diastolic CHF, A. fib, chronic kidney disease, anemia presents to the ER because of increasing shortness of breath.  Patient stated over the  last 2 weeks patient had been having progressive worsening shortness of breath with difficulty lying flat on the bed with orthopnea exertional shortness of breath and increasing abdominal girth and lower extremity edema.  Patient stated he increased his Lasix dose twice a day over the last 1 week despite which symptoms have not improved.  Has not had any chest pain productive cough fever or chills.  Recently had followed up with primary care for right foot wound he sustained while closing the door.  ED Course: In the ER on exam patient has elevated JVD lower extremity edema abdominal distention and chest x-ray shows congestive heart failure pattern.  There was a raised retrocardiac shadow concerning for infection but patient denies any productive cough was afebrile no leukocytosis.  Lab work show hemoglobin of 10.3 BNP 152 troponin 0.04.  Patient was given Lasix 40 mg IV and admitted for further management.  Hospital Course:  1 acute on chronic diastolic heart failure Likely secondary to dietary indiscretion.  Patient states was taking Lasix 40 mg daily however it is noted that patient was to be taking 40 mg twice daily per cardiology's office note.  Last 2D echo from July 2019 with EF of 60 to 65% with grade 2 diastolic dysfunction.  Patient presented with increasing shortness of breath, lower extremity edema, orthopnea, exertional shortness of breath and increasing abdominal girth.  Patient placed on IV Lasix with clinical improvement.  2D echo from 01/04/2019 with a EF of 60 to 65%, severely increased LVH, left ventricular diastolic parameters consistent with pseudonormalization.  Cardiac enzymes flattened at 0.03.  Patient  was initially placed on Lasix 40 mg IV every 12 hours and subsequently dose increased to 60 mg every 12 hours.  Patient had good diuresis and clinical improvement.  Patient will be discharged home on Lasix 60 mg twice daily in addition to his cardiac regimen of aspirin, Lipitor,  Cardizem.  Patient's hydralazine dose has been increased to 50 mg 3 times daily for better blood pressure control.  Patient is to follow-up with his cardiologist in the outpatient setting.   2.  Hypertension Patient maintained on home regimen of Cardizem, hydralazine, Lasix.  Patient's hydralazine was increased to 50 mg every 8 hours for better blood pressure control.  Outpatient follow-up with PCP and cardiology.   3.  Hyperlipidemia Patient maintained on a statin.   4.  Hypothyroidism TSH of 3.670.  Patient maintained on home regimen of Synthroid.  Outpatient follow-up.  5.  Chronic/persistent A. fib Remained rate controlled on amiodarone and Cardizem.  Patient not an anticoagulation due to history of AVM/GI bleed.  Patient maintained on aspirin 81 mg daily.  6.  Poorly controlled diabetes mellitus type 2 Patient with hemoglobin A1c of 10.0 on 09/08/2018.  Patient's Lantus dose was adjusted to 44 units twice daily for better blood glucose control.  Patient's oral hypoglycemic agents were held during the hospitalization and will be resumed on discharge.  Patient was also maintained on sliding scale insulin throughout the hospitalization.  Outpatient follow-up.   7.  Chronic kidney disease stage III Remained stable.    8.  Coronary artery disease Patient with slightly elevated troponins that have flattened likely secondary to problem #1.  Patient denied any chest pain.  Patient maintained on home regimen of cardiac medications of aspirin, statin, Lasix, Cardizem, hydralazine.  Outpatient follow-up.  9.  Right leg heel wound Secondary to trauma.  Patient has been seen by the wound care nurse.    Outpatient follow-up.    Procedures:  Chest x-ray 01/04/2019,   plain films of the right foot 01/03/2019  2D echo 01/04/2019  Consultations:  None  Discharge Exam: Vitals:   01/05/19 2234 01/06/19 0532  BP: (!) 128/46 (!) 153/73  Pulse: 64 65  Resp: 16 15  Temp: 98 F (36.7 C)  98.1 F (36.7 C)  SpO2: 95% 98%    General: NAD Cardiovascular: RRR Respiratory: CTAB  Discharge Instructions   Discharge Instructions    Diet - low sodium heart healthy   Complete by: As directed    Increase activity slowly   Complete by: As directed      Allergies as of 01/06/2019      Reactions   Percocet [oxycodone-acetaminophen] Nausea And Vomiting   Metformin And Related Nausea Only   Upset stomach   Invokana [canagliflozin] Rash      Medication List    TAKE these medications   allopurinol 100 MG tablet Commonly known as: ZYLOPRIM Take 2 tablets (228m total) by mouth daily   amiodarone 200 MG tablet Commonly known as: PACERONE Take one tablet every day except for Sunday.   aspirin 81 MG chewable tablet Chew 1 tablet (81 mg total) by mouth daily.   atorvastatin 40 MG tablet Commonly known as: LIPITOR Take 1 tablet by mouth daily   b complex vitamins tablet Take 1 tablet by mouth daily.   Blood Pressure Monitor Kit Use to check blood pressure daily Dx I10   collagenase ointment Commonly known as: Santyl Apply 1 application topically daily.   diltiazem 180 MG 24 hr capsule Commonly known  asGeronimo Boot XT Take 1 capsule (180 mg total) by mouth daily.   fenofibrate 160 MG tablet Take 1 tablet by mouth daily (Please schedule appointment for future refills) What changed: See the new instructions.   ferrous sulfate 325 (65 FE) MG tablet Take 325 mg by mouth 3 (three) times daily with meals.   furosemide 20 MG tablet Commonly known as: LASIX Take 3 tablets (60 mg total) by mouth 2 (two) times daily. Please make office visit for future refills. What changed:   medication strength  how much to take  additional instructions   gabapentin 100 MG capsule Commonly known as: NEURONTIN Take 1 capsule by mouth twice a day   glipiZIDE 10 MG tablet Commonly known as: GLUCOTROL Take 1 tablet by mouth twice a day before meals What changed: See the new  instructions.   glucose blood test strip Commonly known as: OneTouch Ultra CHECK BLOOD GLUCOSE (SUGAR) 4 TIMES DAILY DX: E11.9, Z79.4 (pt on insulin)   hydrALAZINE 50 MG tablet Commonly known as: APRESOLINE Take 1 tablet (50 mg total) by mouth 3 (three) times daily. What changed:   medication strength  how much to take   HYDROcodone-acetaminophen 5-325 MG tablet Commonly known as: NORCO/VICODIN Take 1 tablet by mouth 2 (two) times daily as needed for moderate pain.   insulin aspart protamine - aspart (70-30) 100 UNIT/ML FlexPen Commonly known as: NOVOLOG 70/30 MIX 34 units sq in am and 22 units sq in pm What changed:   how much to take  additional instructions   Insulin Pen Needle 32G X 4 MM Misc Use to administer insulin   levothyroxine 150 MCG tablet Commonly known as: SYNTHROID 1 po q am What changed:   how much to take  how to take this  when to take this  additional instructions   NEEDLE (DISP) 30 G 30G X 1/2" Misc Commonly known as: BD Disp Needles USE WITH 1 SYRINGE SUBCUTANEOUSLY  TWICE DAILY   nitroGLYCERIN 0.4 MG SL tablet Commonly known as: NITROSTAT Place 1 tablet under tongue for chest pain. Repeat every 5 minutes as needed, up to 3 doses. If no relief after 3 doses, call 911. What changed: See the new instructions.   omeprazole 40 MG capsule Commonly known as: PRILOSEC Take 1 capsule by mouth daily   ONE TOUCH ULTRA 2 w/Device Kit Use as directed Dx E11.9   onetouch ultrasoft lancets Use to check blood sugars daily   potassium chloride SA 20 MEQ tablet Commonly known as: K-DUR Take 1 tablet by mouth daily   Vitamin D-3 25 MCG (1000 UT) Caps Take 1,000-5,000 Units by mouth See admin instructions. 1,000 units once a day on Sun/Mon/Tues/Wed/Thurs/Fri and 5,000 units on Sat      Allergies  Allergen Reactions  . Percocet [Oxycodone-Acetaminophen] Nausea And Vomiting  . Metformin And Related Nausea Only    Upset stomach  . Invokana  [Canagliflozin] Rash   Follow-up Information    [name], Evie Lacks, MD. Schedule an appointment as soon as possible for a visit in 2 week(s).   Specialty: Internal Medicine Contact information: [place] [address] [phone]        Josue Hector, MD. Schedule an appointment as soon as possible for a visit in 2 week(s).   Specialty: Cardiology Contact information: [address] [place] [address] [phone]            The results of significant diagnostics from this hospitalization (including  imaging, microbiology, ancillary and laboratory) are listed below for reference.    Significant Diagnostic Studies: Dg Chest Portable 1 View  Result Date: 01/04/2019 CLINICAL DATA:  Shortness of breath EXAM: PORTABLE CHEST 1 VIEW COMPARISON:  12/28/2017 FINDINGS: Cardiac size is enlarged. There is generalized volume overload. There may be a retrocardiac/left basilar airspace opacity. No pneumothorax. No large pleural effusion. No acute osseous abnormality. There are dense aortic calcifications. The lung volumes are low. IMPRESSION: 1. Mild cardiac enlargement with mild volume overload. 2. Low lung volumes. 3. Possible retrocardiac/left basilar airspace opacity which may represent atelectasis or developing infiltrate. Electronically Signed   By: Constance Holster M.D.   On: 01/04/2019 02:18   Dg Foot Complete Right  Result Date: 01/03/2019 Please see detailed radiograph report in office note.  Vas [name] Le Art Seg Multi (segm&le Reynauds)  Result Date: 12/22/2018 LOWER EXTREMITY DOPPLER STUDY Indications: Ulceration, peripheral artery disease, and Patient has a wound on              the right heel. He states that a door shut on him. The wound is              healing with no issues, per patient. Patient denies any              claudication symptoms and no rest pain. High Risk Factors: Hypertension, hyperlipidemia, Diabetes, past history of                     smoking, coronary artery disease.  Performing Technologist: Wilkie Aye RVT  Examination Guidelines: A complete evaluation includes at minimum, Doppler waveform signals and systolic blood pressure reading at the level of bilateral brachial, anterior tibial, and posterior tibial arteries, when vessel segments are accessible. Bilateral testing is considered an integral part of a complete examination. Photoelectric Plethysmograph (PPG) waveforms and toe systolic pressure readings are included as required and additional duplex testing as needed. Limited examinations for reoccurring indications may be performed as noted.  ABI Findings: +---------+------------------+-----+---------+-------------------------------+ Right    Rt Pressure (mmHg)IndexWaveform Comment                         +---------+------------------+-----+---------+-------------------------------+ Brachial 142                             Known subclavian artery disease +---------+------------------+-----+---------+-------------------------------+ CFA                             triphasic                                +---------+------------------+-----+---------+-------------------------------+ Popliteal                       triphasic                                +---------+------------------+-----+---------+-------------------------------+ ATA      255               1.27 triphasic                                +---------+------------------+-----+---------+-------------------------------+ PTA      255  1.27 triphasic                                +---------+------------------+-----+---------+-------------------------------+ PERO     255               1.27 triphasic                                +---------+------------------+-----+---------+-------------------------------+ Great Toe199               1.00 Normal                                    +---------+------------------+-----+---------+-------------------------------+ +---------+------------------+-----+---------+-------+ Left     Lt Pressure (mmHg)IndexWaveform Comment +---------+------------------+-----+---------+-------+ Brachial 200                                     +---------+------------------+-----+---------+-------+ CFA                             triphasic        +---------+------------------+-----+---------+-------+ Popliteal                       triphasic        +---------+------------------+-----+---------+-------+ ATA      255               1.27 triphasic        +---------+------------------+-----+---------+-------+ PTA      255               1.27 triphasic        +---------+------------------+-----+---------+-------+ PERO     255               1.27 triphasic        +---------+------------------+-----+---------+-------+ Doristine Devoid Toe199               1.00 Normal           +---------+------------------+-----+---------+-------+  Summary: Right: Resting right ankle-brachial index indicates noncompressible right lower extremity arteries.The right toe-brachial index is normal. Left: Resting left ankle-brachial index indicates noncompressible left lower extremity arteries.The left toe-brachial index is normal.  *See table(s) above for measurements and observations.  Electronically signed by Larae Grooms MD on 12/22/2018 at 6:23:38 PM.    Final     Microbiology: Recent Results (from the past 240 hour(s))  WOUND CULTURE     Status: None (Preliminary result)   Collection Time: 01/03/19 11:15 AM   Specimen: Abscess; Wound  Result Value Ref Range Status   MICRO NUMBER: [id number]  Preliminary   SPECIMEN QUALITY: Adequate  Preliminary   SOURCE: R FOOT, BACK OF HEEL  Preliminary   STATUS: PRELIMINARY  Preliminary   GRAM STAIN:   Preliminary    No white blood cells seen No epithelial cells seen Few Gram positive cocci in pairs Few Gram  negative bacilli   RESULT: Culture in progress  Preliminary  SARS Coronavirus 2 (CEPHEID - Performed in [facility] hospital lab), Hosp Order     Status: None   Collection Time: 01/04/19  2:39 AM   Specimen: Nasopharyngeal Swab  Result Value Ref Range Status   SARS Coronavirus 2 NEGATIVE NEGATIVE Final  Comment: (NOTE) If result is NEGATIVE SARS-CoV-2 target nucleic acids are NOT DETECTED. The SARS-CoV-2 RNA is generally detectable in upper and lower  respiratory specimens during the acute phase of infection. The lowest  concentration of SARS-CoV-2 viral copies this assay can detect is 250  copies / mL. A negative result does not preclude SARS-CoV-2 infection  and should not be used as the sole basis for treatment or other  patient management decisions.  A negative result may occur with  improper specimen collection / handling, submission of specimen other  than nasopharyngeal swab, presence of viral mutation(s) within the  areas targeted by this assay, and inadequate number of viral copies  (<250 copies / mL). A negative result must be combined with clinical  observations, patient history, and epidemiological information. If result is POSITIVE SARS-CoV-2 target nucleic acids are DETECTED. The SARS-CoV-2 RNA is generally detectable in upper and lower  respiratory specimens dur ing the acute phase of infection.  Positive  results are indicative of active infection with SARS-CoV-2.  Clinical  correlation with patient history and other diagnostic information is  necessary to determine patient infection status.  Positive results do  not rule out bacterial infection or co-infection with other viruses. If result is PRESUMPTIVE POSTIVE SARS-CoV-2 nucleic acids MAY BE PRESENT.   A presumptive positive result was obtained on the submitted specimen  and confirmed on repeat testing.  While 2019 novel coronavirus  (SARS-CoV-2) nucleic acids may be present in the submitted sample  additional  confirmatory testing may be necessary for epidemiological  and / or clinical management purposes  to differentiate between  SARS-CoV-2 and other Sarbecovirus currently known to infect humans.  If clinically indicated additional testing with an alternate test  methodology [phone]) is advised. The SARS-CoV-2 RNA is generally  detectable in upper and lower respiratory sp ecimens during the acute  phase of infection. The expected result is Negative. Fact Sheet for Patients:  StrictlyIdeas.no Fact Sheet for Healthcare Providers: BankingDealers.co.za This test is not yet approved or cleared by the [place] FDA and has been authorized for detection and/or diagnosis of SARS-CoV-2 by FDA under an Emergency Use Authorization (EUA).  This EUA will remain in effect (meaning this test can be used) for the duration of the COVID-19 declaration under Section 564(b)(1) of the Act, 21 U.S.C. section 360bbb-3(b)(1), unless the authorization is terminated or revoked sooner. Performed at [facility], [place] [address] [place], [place] [address]      Labs: Basic Metabolic Panel: Recent Labs  Lab 01/04/19 0142 01/04/19 0542 01/05/19 0541 01/06/19 0539  NA 135 137 136 139  K 4.3 4.3 4.5 4.7  CL 106 107 105 103  CO2 _0 GLUCOSE 342* 278* 227* 155*  BUN 31* 29* 28* 40*  CREATININE 1.83* 1.83* 1.72* 2.08*  CALCIUM 8.0* 8.0* 8.3* 8.6*  MG  --  2.3 2.5*  --    Liver Function Tests: Recent Labs  Lab 01/04/19 0542  AST 23  ALT 22  ALKPHOS 38  BILITOT 0.8  PROT 6.7  ALBUMIN 3.8   No results for input(s): LIPASE, AMYLASE in the last 168 hours. No results for input(s): AMMONIA in the last 168 hours. CBC: Recent Labs  Lab 01/04/19 0142 01/04/19 0542 01/05/19 0541 01/06/19 0539  WBC 5.0 4.2 4.0 4.3  NEUTROABS 4.2 3.3  --  3.1  HGB 10.3* 9.9* 10.2* 10.4*  HCT 30.7* 30.6* 31.5* 33.4*  MCV 94.5 94.2 93.5 94.6   PLT  169 142* 143* 151   Cardiac Enzymes: Recent Labs  Lab 01/04/19 0142 01/04/19 0542 01/04/19 1253 01/04/19 1655  TROPONINI 0.04* 0.04* 0.03* 0.03*   BNP: BNP (last 3 results) Recent Labs    01/04/19 0142  BNP 152.1*    ProBNP (last 3 results) No results for input(s): PROBNP in the last 8760 hours.  CBG: Recent Labs  Lab 01/05/19 1148 01/05/19 1705 01/05/19 2059 01/06/19 0752 01/06/19 1204  GLUCAP 213* 187* 125* 170* 172*       Signed:  Irine Seal MD.  [facility] 01/06/2019, 12:56 PM

## 2019-01-07 LAB — WOUND CULTURE
MICRO NUMBER:: 551398
SPECIMEN QUALITY:: ADEQUATE

## 2019-01-09 ENCOUNTER — Telehealth: Payer: Self-pay | Admitting: *Deleted

## 2019-01-09 MED ORDER — SULFAMETHOXAZOLE-TRIMETHOPRIM 400-80 MG PO TABS: 1.0000 | ORAL_TABLET | Freq: Two times a day (BID) | ORAL | 0 refills | Status: DC

## 2019-01-09 NOTE — Telephone Encounter (Signed)
-----   Message from Landis Martins, [place] sent at 01/09/2019  9:46 AM EDT ----- Wound culture is + for Staph please call patient and send to his pharmacy Bactrim 400/80 BID 28 tabs Thanks Dr. Chauncey Cruel

## 2019-01-09 NOTE — Telephone Encounter (Signed)
I informed pt's wife, [name] of Dr. Leeanne Rio review of results and orders.

## 2019-01-10 ENCOUNTER — Telehealth: Payer: Self-pay

## 2019-01-10 NOTE — Telephone Encounter (Signed)
YOUR CARDIOLOGY TEAM HAS ARRANGED FOR AN E-VISIT FOR YOUR APPOINTMENT - PLEASE REVIEW IMPORTANT INFORMATION BELOW SEVERAL DAYS PRIOR TO YOUR APPOINTMENT  Due to the recent COVID-19 pandemic, we are transitioning in-person office visits to tele-medicine visits in an effort to decrease unnecessary exposure to our patients, their families, and staff. These visits are billed to your insurance just like a normal visit is. We also encourage you to sign up for MyChart if you have not already done so. You will need a smartphone if possible. For patients that do not have this, we can still complete the visit using a regular telephone but do prefer a smartphone to enable video when possible. You may have a family member that lives with you that can help. If possible, we also ask that you have a blood pressure cuff and scale at home to measure your blood pressure, heart rate and weight prior to your scheduled appointment. Patients with clinical needs that need an in-person evaluation and testing will still be able to come to the office if absolutely necessary. If you have any questions, feel free to call our office.     YOUR PROVIDER WILL BE USING THE FOLLOWING PLATFORM TO COMPLETE YOUR VISIT: Doximity  . IF USING MYCHART - How to Download the MyChart App to Your SmartPhone   - If Apple, go to App Store and type in MyChart in the search bar and download the app. If Android, ask patient to go to Google Play Store and type in MyChart in the search bar and download the app. The app is free but as with any other app downloads, your phone may require you to verify saved payment information or Apple/Android password.  - You will need to then log into the app with your MyChart username and password, and select [facility] as your healthcare provider to link the account.  - When it is time for your visit, go to the MyChart app, find appointments, and click Begin Video Visit. Be sure to Select Allow for your device to  access the Microphone and Camera for your visit. You will then be connected, and your provider will be with you shortly.  **If you have any issues connecting or need assistance, please contact MyChart service desk [phone] ([phone])**  **If using a computer, in order to ensure the best quality for your visit, you will need to use either of the following Internet Browsers: Google Chrome or Microsoft Edge**  . IF USING DOXIMITY or [email or web address].ME - The staff will give you instructions on receiving your link to join the meeting the day of your visit.      2-3 DAYS BEFORE YOUR APPOINTMENT  You will receive a telephone call from one of our [facility] team members - your caller ID may say "Unknown caller." If this is a video visit, we will walk you through how to get the video launched on your phone. We will remind you check your blood pressure, heart rate and weight prior to your scheduled appointment. If you have an Apple Watch or Kardia, please upload any pertinent ECG strips the day before or morning of your appointment to MyChart. Our staff will also make sure you have reviewed the consent and agree to move forward with your scheduled tele-health visit.     THE DAY OF YOUR APPOINTMENT  Approximately 15 minutes prior to your scheduled appointment, you will receive a telephone call from one of HeartCare team - your caller ID may say "Unknown caller."    Our staff will confirm medications, vital signs for the day and any symptoms you may be experiencing. Please have this information available prior to the time of visit start. It may also be helpful for you to have a pad of paper and pen handy for any instructions given during your visit. They will also walk you through joining the smartphone meeting if this is a video visit.    CONSENT FOR TELE-HEALTH VISIT - PLEASE REVIEW  I hereby voluntarily request, consent and authorize [facility] and its employed or contracted physicians, physician  assistants, nurse practitioners or other licensed health care professionals (the Practitioner), to provide me with telemedicine health care services (the "Services") as deemed necessary by the treating Practitioner. I acknowledge and consent to receive the Services by the Practitioner via telemedicine. I understand that the telemedicine visit will involve communicating with the Practitioner through live audiovisual communication technology and the disclosure of certain medical information by electronic transmission. I acknowledge that I have been given the opportunity to request an in-person assessment or other available alternative prior to the telemedicine visit and am voluntarily participating in the telemedicine visit.  I understand that I have the right to withhold or withdraw my consent to the use of telemedicine in the course of my care at any time, without affecting my right to future care or treatment, and that the Practitioner or I may terminate the telemedicine visit at any time. I understand that I have the right to inspect all information obtained and/or recorded in the course of the telemedicine visit and may receive copies of available information for a reasonable fee.  I understand that some of the potential risks of receiving the Services via telemedicine include:  . Delay or interruption in medical evaluation due to technological equipment failure or disruption; . Information transmitted may not be sufficient (e.g. poor resolution of images) to allow for appropriate medical decision making by the Practitioner; and/or  . In rare instances, security protocols could fail, causing a breach of personal health information.  Furthermore, I acknowledge that it is my responsibility to provide information about my medical history, conditions and care that is complete and accurate to the best of my ability. I acknowledge that Practitioner's advice, recommendations, and/or decision may be based on  factors not within their control, such as incomplete or inaccurate data provided by me or distortions of diagnostic images or specimens that may result from electronic transmissions. I understand that the practice of medicine is not an exact science and that Practitioner makes no warranties or guarantees regarding treatment outcomes. I acknowledge that I will receive a copy of this consent concurrently upon execution via email to the email address I last provided but may also request a printed copy by calling the office of [facility].    I understand that my insurance will be billed for this visit.   I have read or had this consent read to me. . I understand the contents of this consent, which adequately explains the benefits and risks of the Services being provided via telemedicine.  . I have been provided ample opportunity to ask questions regarding this consent and the Services and have had my questions answered to my satisfaction. . I give my informed consent for the services to be provided through the use of telemedicine in my medical care  By participating in this telemedicine visit I agree to the above.  

## 2019-01-12 ENCOUNTER — Telehealth (INDEPENDENT_AMBULATORY_CARE_PROVIDER_SITE_OTHER): Payer: Medicare Other | Admitting: Internal Medicine

## 2019-01-12 ENCOUNTER — Other Ambulatory Visit: Payer: Self-pay

## 2019-01-12 DIAGNOSIS — I2510 Atherosclerotic heart disease of native coronary artery without angina pectoris: Secondary | ICD-10-CM

## 2019-01-12 DIAGNOSIS — I480 Paroxysmal atrial fibrillation: Secondary | ICD-10-CM

## 2019-01-12 NOTE — Progress Notes (Signed)
Electrophysiology TeleHealth Note   Due to national recommendations of social distancing due to COVID 19, an audio/video telehealth visit is felt to be most appropriate for this patient at this time.  See MyChart message from today for the patient's consent to telehealth for [facility].   Date:  01/12/2019   ID:  Michael [name], DOB 03/30/1941, MRN [id number]  Location: patient's home  Provider location: [address], [place]  Evaluation Performed: Follow-up visit  PCP:  [name], Evie Lacks, MD  Cardiologist:  No primary care provider on file. [place] Electrophysiologist:  Dr Lovena Le  Chief Complaint:  "I was in the hospital last week with congestive heart failure. "  History of Present Illness:    Michael [name] is a 78 y.o. male who presents via audio/video conferencing for a telehealth visit today. He is a pleasant 78 yo man with a h/o HTN, obesity and CAD with PAF controlled on amiodarone. When I saw him last, he was instructed to reduce his dose of amiodarone down to 200 mg daily, none on Sunday. Since last being seen in our clinic, the patient reports doing very well.  Today, he denies symptoms of palpitations, chest pain, shortness of breath,  lower extremity edema, dizziness, presyncope, or syncope.  The patient is otherwise without complaint today.  The patient denies symptoms of fevers, chills, cough, or new SOB worrisome for COVID 19.  Past Medical History:  Diagnosis Date  . Acute on chronic diastolic (congestive) heart failure ([place]) 02/05/2017  . Acute on chronic heart failure ([place])   . Allergy   . Arthritis   . Atrial fibrillation with RVR ([place]) 06/25/2017   hx/notes 06/25/2017  . AVM (arteriovenous malformation) of colon   . AVM (arteriovenous malformation) of small bowel, acquired with hemorrhage 01/20/2012   Found on small bowel capsule endoscopy, June 2013   . C. difficile enteritis   . CAD (coronary artery disease)    a. Cath 10/2014 - Mild to  moderate diagonal, circumflex and obtuse marginal disease. Innominate artery sternosis.  . Carotid artery disease ([place])    a. Carotid dopple 03/2014 00-34% RICA &  91-79% LICA stenosis b. 1/50  . Carotid stenosis 11/06/2013   2014 B moderate On ASA, Plavix, Lipitor  . Chronic diastolic CHF (congestive heart failure) ([place]) 07/27/2016   2017 Diltiazem, Toprol, Furosemide, Hydralazine, Amiodarone  . CKD (chronic kidney disease), stage III ([place])   . Clostridium difficile colitis 05/13/2016  . Coronary artery disease involving native coronary artery of native heart without angina pectoris 07/31/2016   Lopressor - d/c 7/19, Lipitor and ASA  . DM (diabetes mellitus), type 2 with peripheral vascular complications ([place]) 5/69/7948   Chronic on Metformin d/c,  Glipizide 2016 Invokana - stopped On Toujeo since 2017  . Dyspnea   . Dysrhythmia    ATRIAL FIBRILATION  . Elevated troponin    a. 09/2014 in setting of AF RVR-->Myoview: EF 49%, no ischemia/infarct->Med Rx.  . Essential hypertension    On Cardizem, toprol - stopped 7/19, furosemide - trial off acei 06/05/2016 due to doe not fully explained > improved 07/07/2016  On diovan 160 mg daily     . GERD (gastroesophageal reflux disease)   . Gout   . H/O transfusion of whole blood   . Heart murmur   . History of PFTs    PFTs 6/16:  FVC 3.73 (87%), FEV1 2.93 (88%), FEV1/FVC 78%, DLCO 69%  . Hx of adenomatous colonic polyps 07/02/2016  .  Hyperlipidemia   . Hypertension   . Hypertriglyceridemia 05/16/2013  . Hypothyroidism   . Iron deficiency anemia   . Nonrheumatic aortic valve stenosis   . NSTEMI (non-ST elevated myocardial infarction) ([place])    hx/notes 06/25/2017  . PAD (peripheral artery disease) ([place]) 07/31/2016   On ASA, Plavix  . PAF (paroxysmal atrial fibrillation) ([place])    a. 09/2014: Converted on Dilt;  b. CHA2DS2VASc = 3-->eliquis;  c. 09/2014 Echo: EF 55-60%, mild LVH, mildly dil LA.  [name] Persistent atrial fibrillation    a. 09/2014: Converted  on Dilt;  b. CHA2DS2VASc = 3-->eliquis;  c. 09/2014 Echo: EF 55-60%, mild LVH, mildly dil LA.  2018 stopped Eliquis On amiodarone, cardizem On ASA and Plavix  . Personal history of colonic polyps 2007, 2008   adenoma each time, largest 12 mm in 2007  . Psoriasis   . Septic phlebitis of upper extremity 08/26/2016   2018 L hand Dr Megan Salon f/u Keflex and Vanc po  . Septic thrombophlebitis of upper extremity 07/31/2016  . Staph infection 07/2016   left hand   . Staphylococcus aureus bacteremia 07/31/2016  . Subclavian artery stenosis, right ([place]) 01/16/2015  . Type II diabetes mellitus ([place])    TYPE 2    Past Surgical History:  Procedure Laterality Date  . APPENDECTOMY  02/09/2014  . CARDIAC CATHETERIZATION N/A 07/29/2016   Procedure: Right/Left Heart Cath and Coronary Angiography;  Surgeon: Nelva Bush, MD;  Location: [facility];  Service: Cardiovascular;  Laterality: N/A;  . CARDIAC CATHETERIZATION N/A 07/29/2016   Procedure: Coronary Stent Intervention;  Surgeon: Nelva Bush, MD;  Location: [facility];  Service: Cardiovascular;  Laterality: N/A;  . CARDIAC CATHETERIZATION  10/2014  . COLONOSCOPY  02/10/2011   internal hemorrhoids  . COLONOSCOPY W/ POLYPECTOMY  04/09/2006   12 mm adenoma  . COLONOSCOPY W/ POLYPECTOMY  06/17/2007   5 mm adenoma  . COLONOSCOPY WITH PROPOFOL N/A 05/13/2016   Procedure: COLONOSCOPY WITH PROPOFOL;  Surgeon: Manus Gunning, MD;  Location: [facility];  Service: Gastroenterology;  Laterality: N/A;  . ENTEROSCOPY N/A 08/14/2016   Procedure: ENTEROSCOPY;  Surgeon: Manus Gunning, MD;  Location[facility] ENDOSCOPY;  Service: Gastroenterology;  Laterality: N/A;  . ESOPHAGOGASTRODUODENOSCOPY  12/23/2011   Procedure: ESOPHAGOGASTRODUODENOSCOPY (EGD);  Surgeon: Irene Shipper, MD;  Location: [facility];  Service: Endoscopy;  Laterality: N/A;  with small bowel bx's  . GIVENS CAPSULE STUDY  12/28/2011  . GIVENS CAPSULE STUDY N/A 08/14/2016    Procedure: GIVENS CAPSULE STUDY;  Surgeon: Manus Gunning, MD;  Location: [facility];  Service: Gastroenterology;  Laterality: N/A;  . KNEE ARTHROSCOPY Right   . LAPAROSCOPIC APPENDECTOMY N/A 02/09/2014   Procedure: APPENDECTOMY LAPAROSCOPIC;  Surgeon: Leighton Ruff, MD;  Location: [facility];  Service: General;  Laterality: N/A;  . LEFT HEART CATHETERIZATION WITH CORONARY ANGIOGRAM N/A 11/15/2014   Procedure: LEFT HEART CATHETERIZATION WITH CORONARY ANGIOGRAM;  Surgeon: Belva Crome, MD;  Location: [facility] C[facility] LAB;  Service: Cardiovascular;  Laterality: N/A;  . SHOULDER OPEN ROTATOR CUFF REPAIR Left     Current Outpatient Medications  Medication Sig Dispense Refill  . allopurinol (ZYLOPRIM) 100 MG tablet Take 2 tablets (277m total) by mouth daily 60 tablet 5  . amiodarone (PACERONE) 200 MG tablet Take one tablet every day except for Sunday. 90 tablet 3  . aspirin 81 MG chewable tablet Chew 1 tablet (81 mg total) by mouth daily. 90 tablet 1  . atorvastatin (LIPITOR) 40 MG tablet Take  1 tablet by mouth daily 90 tablet 3  . b complex vitamins tablet Take 1 tablet by mouth daily. 100 tablet 3  . Blood Glucose Monitoring Suppl (ONE TOUCH ULTRA 2) W/DEVICE KIT Use as directed Dx E11.9 1 each 0  . Blood Pressure Monitor KIT Use to check blood pressure daily Dx I10 1 each 0  . Cholecalciferol (VITAMIN D-3) 1000 units CAPS Take 1,000-5,000 Units by mouth See admin instructions. 1,000 units once a day on Sun/Mon/Tues/Wed/Thurs/Fri and 5,000 units on Sat    . collagenase (SANTYL) ointment Apply 1 application topically daily. 15 g 2  . diltiazem (CARTIA XT) 180 MG 24 hr capsule Take 1 capsule (180 mg total) by mouth daily. 90 capsule 1  . fenofibrate 160 MG tablet Take 1 tablet by mouth daily (Please schedule appointment for future refills) (Patient taking differently: Take 160 mg by mouth daily. ) 90 tablet 1  . ferrous sulfate 325 (65 FE) MG tablet Take 325 mg by mouth 3 (three) times daily with  meals.    . furosemide (LASIX) 20 MG tablet Take 3 tablets (60 mg total) by mouth 2 (two) times daily. Please make office visit for future refills. 180 tablet 0  . gabapentin (NEURONTIN) 100 MG capsule Take 1 capsule by mouth twice a day 180 capsule 3  . glipiZIDE (GLUCOTROL) 10 MG tablet Take 1 tablet by mouth twice a day before meals (Patient taking differently: Take 10 mg by mouth 2 (two) times daily before a meal. ) 180 tablet 3  . glucose blood (ONETOUCH ULTRA) test strip CHECK BLOOD GLUCOSE (SUGAR) 4 TIMES DAILY DX: E11.9, Z79.4 (pt on insulin) 400 each 3  . hydrALAZINE (APRESOLINE) 50 MG tablet Take 1 tablet (50 mg total) by mouth 3 (three) times daily. 90 tablet 0  . HYDROcodone-acetaminophen (NORCO/VICODIN) 5-325 MG tablet Take 1 tablet by mouth 2 (two) times daily as needed for moderate pain. 60 tablet 0  . insulin aspart protamine - aspart (NOVOLOG 70/30 MIX) (70-30) 100 UNIT/ML FlexPen 34 units sq in am and 22 units sq in pm (Patient taking differently: 40-44 Units. 44 units sq in am and 40 units sq in pm) 15 mL 11  . Insulin Pen Needle 32G X 4 MM MISC Use to administer insulin 100 each 11  . Lancets (ONETOUCH ULTRASOFT) lancets Use to check blood sugars daily 30 each 11  . levothyroxine (SYNTHROID, LEVOTHROID) 150 MCG tablet 1 po q am (Patient taking differently: Take 150 mcg by mouth daily before breakfast. ) 90 tablet 3  . NEEDLE, DISP, 30 G (BD DISP NEEDLES) 30G X 1/2" MISC USE WITH 1 SYRINGE SUBCUTANEOUSLY  TWICE DAILY 100 each 11  . nitroGLYCERIN (NITROSTAT) 0.4 MG SL tablet Place 1 tablet under tongue for chest pain. Repeat every 5 minutes as needed, up to 3 doses. If no relief after 3 doses, call 911. (Patient taking differently: Place 0.4 mg under the tongue every 5 (five) minutes as needed for chest pain. ) 25 tablet 11  . omeprazole (PRILOSEC) 40 MG capsule Take 1 capsule by mouth daily 90 capsule 0  . potassium chloride SA (K-DUR,KLOR-CON) 20 MEQ tablet Take 1 tablet by mouth  daily 90 tablet 0  . sulfamethoxazole-trimethoprim (BACTRIM) 400-80 MG tablet Take 1 tablet by mouth 2 (two) times daily. 28 tablet 0   No current facility-administered medications for this visit.     Allergies:   Percocet [oxycodone-acetaminophen], Metformin and related, and Invokana [canagliflozin]   Social History:  The patient  reports that he quit smoking about 12 years ago. His smoking use included cigarettes. He has a 33.60 pack-year smoking history. He has never used smokeless tobacco. He reports current alcohol use of about 1.0 standard drinks of alcohol per week. He reports that he does not use drugs.   Family History:  The patient's  family history includes COPD in his sister; Cancer in his brother; Diabetes in his father; Heart attack in his father; Heart disease in his brother; Hypertension in his father and mother; Lung cancer in his father; Stroke in his father and mother.   ROS:  Please see the history of present illness.   All other systems are personally reviewed and negative.    Exam:    Vital Signs:  Wt - 251 lbs, BP - 164/70, P -64  Well appearing, alert and conversant, regular work of breathing,  good skin color Eyes- anicteric, neuro- grossly intact, skin- no apparent rash or lesions or cyanosis, mouth- oral mucosa is pink   Labs/Other Tests and Data Reviewed:    Recent Labs: 01/04/2019: ALT 22; B Natriuretic Peptide 152.1; TSH 3.670 01/05/2019: Magnesium 2.5 01/06/2019: BUN 40; Creatinine, Ser 2.08; Hemoglobin 10.4; Platelets 151; Potassium 4.7; Sodium 139   Wt Readings from Last 3 Encounters:  01/05/19 257 lb 0.9 oz (116.6 kg)  12/20/18 255 lb (115.7 kg)  10/18/18 255 lb (115.7 kg)     Other studies personally reviewed:  ASSESSMENT & PLAN:    1.  PAF - he is maintaining NSR on amiodarone. He will continue 200 mg daily and none on Sunday. 2. HTN - his BP is elevated. He is encouraged to lose weight and reduce his salt intake. 3. HFPEF - his fluid is  stable after 3 days in the hospital last week. His EF is normal. He will continue his current meds. Ultimately might consider switching to entresto. 4. COVID 19 screen The patient denies symptoms of COVID 19 at this time.  The importance of social distancing was discussed today.  Follow-up:  6 months Next remote: n/a  Current medicines are reviewed at length with the patient today.   The patient does not have concerns regarding his medicines.  The following changes were made today:  none  Labs/ tests ordered today include: none No orders of the defined types were placed in this encounter.    Patient Risk:  after full review of this patients clinical status, I feel that they are at moderate risk at this time.  Today, I have spent 15 minutes with the patient with telehealth technology discussing all of the above .    Signed, Cristopher Peru, MD  01/12/2019 10:42 AM     [place] [place] [address] [phone] (office) [phone] (fax)

## 2019-01-16 ENCOUNTER — Ambulatory Visit: Payer: Self-pay

## 2019-01-16 ENCOUNTER — Telehealth: Payer: Self-pay

## 2019-01-16 NOTE — Telephone Encounter (Signed)
Wife pf pt called to report dizziness, feeling "off balance since 01/11/19. Pt's wife stated that pt's hydralazine dose was increased form 25 mg three times per day to 50 mg three times a day at discharge on 01/06/19. Pt stated that standing, walking, and standing up after bending over and turning his head makes the dizziness worse. Pt is having to hold on to objects in the house due to the dizziness. Pt denies other symptoms such as weakness or numbness to any extremist or to face.  Wife stated he had dizziness approximately 2 year ago and his BP med was adjusted and the dizziness resolved. BP and HR during call: 159/72 and HR 68 bpm. Denies chest pain.  Care advice given and wife verbalized understanding. Call transferred to [place] at [facility] to schedule appt.     Reason for Disposition . Taking a medicine that could cause dizziness (e.g., blood pressure medications, diuretics)  Answer Assessment - Initial Assessment Questions 1. DESCRIPTION: "Describe your dizziness."    Off balance  2. LIGHTHEADED: "Do you feel lightheaded?" (e.g., somewhat faint, woozy, weak upon standing)     yes 3. VERTIGO: "Do you feel like either you or the room is spinning or tilting?" (i.e. vertigo)     no 4. SEVERITY: "How bad is it?"  "Do you feel like you are going to faint?" "Can you stand and walk?"   - MILD - walking normally   - MODERATE - interferes with normal activities (e.g., work, school)    - SEVERE - unable to stand, requires support to walk, feels like passing out now.      moderate 5. ONSET:  "When did the dizziness begin?"     01/11/19 5 after hydralazine was increased to 50 mg tid 6. AGGRAVATING FACTORS: "Does anything make it worse?" (e.g., standing, change in head position)     Standing, walking,standing up after bending over, turning head 7. HEART RATE: "Can you tell me your heart rate?" "How many beats in 15 seconds?"  (Note: not all patients can do this)       159/72 HR 68 8. CAUSE: "What do you  think is causing the dizziness?"     Dose of hydralazine was increased at discharged from the hospital.from 25 mg to 50 mg three times a day 9. RECURRENT SYMPTOM: "Have you had dizziness before?" If so, ask: "When was the last time?" "What happened that time?"    2 years ago- BP medication dose adjusted 10. OTHER SYMPTOMS: "Do you have any other symptoms?" (e.g., fever, chest pain, vomiting, diarrhea, bleeding)       no 11. PREGNANCY: "Is there any chance you are pregnant?" "When was your last menstrual period?"       n/a  Protocols used: DIZZINESS [facility]

## 2019-01-16 NOTE — Telephone Encounter (Signed)
Wife stated during triage call that they are running out of pens early and the pharmacy is trying to get them to pay out of pocket. Pt's dose is different than what was originally ordered, so they are running out quicker. Pt is currently taking 50 units of 70/30 insulin in the morning and in the evening.  Prescription was written as: 34 units in am and 22 units in the pm.  Wife is requesting med to be reordered.

## 2019-01-17 ENCOUNTER — Ambulatory Visit (INDEPENDENT_AMBULATORY_CARE_PROVIDER_SITE_OTHER): Payer: Medicare Other | Admitting: Sports Medicine

## 2019-01-17 ENCOUNTER — Encounter: Payer: Self-pay | Admitting: Sports Medicine

## 2019-01-17 ENCOUNTER — Other Ambulatory Visit: Payer: Self-pay

## 2019-01-17 ENCOUNTER — Ambulatory Visit: Payer: Medicare Other

## 2019-01-17 VITALS — Temp 98.1°F

## 2019-01-17 DIAGNOSIS — M79671 Pain in right foot: Secondary | ICD-10-CM

## 2019-01-17 DIAGNOSIS — I739 Peripheral vascular disease, unspecified: Secondary | ICD-10-CM

## 2019-01-17 DIAGNOSIS — I2510 Atherosclerotic heart disease of native coronary artery without angina pectoris: Secondary | ICD-10-CM | POA: Diagnosis not present

## 2019-01-17 DIAGNOSIS — L97419 Non-pressure chronic ulcer of right heel and midfoot with unspecified severity: Secondary | ICD-10-CM

## 2019-01-17 NOTE — Progress Notes (Signed)
Subjective: Michael [name] is a 78 y.o. male patient seen in office for evaluation of ulceration of the Right heel. Patient and wife reports that wound is doing better. Very minimal pain or drainage. Denies nausea, vomiting, fever, chills, reports that after last visit went to hospital and was admitted for several days and had fluid removed. Denies any other symptoms at this time.    Patient Active Problem List   Diagnosis Date Noted  . Open wound of heel, right, sequela   . Acute CHF (congestive heart failure) ([place]) 01/04/2019  . Open leg wound 12/16/2018  . Degenerative arthritis of left knee 10/18/2018  . Peripheral vascular disease ([place]) 10/18/2018  . Atrial fibrillation with rapid ventricular response ([place]) 06/25/2017  . Atrial fibrillation with RVR ([place]) 06/25/2017  . GERD (gastroesophageal reflux disease) 02/05/2017  . PAF (paroxysmal atrial fibrillation) ([place]) 02/05/2017  . CKD (chronic kidney disease), stage III ([place]) 02/05/2017  . Acute on chronic diastolic (congestive) heart failure ([place]) 02/05/2017  . History of GI bleed 08/26/2016  . Septic phlebitis of upper extremity 08/26/2016  . Coronary artery disease involving native coronary artery of native heart without angina pectoris 07/31/2016  . PAD (peripheral artery disease) ([place]) 07/31/2016  . Septic thrombophlebitis of upper extremity 07/31/2016  . History of Clostridium difficile colitis 07/31/2016  . Nonrheumatic aortic valve stenosis   . Chronic diastolic CHF (congestive heart failure) ([place]) 07/27/2016  . Hx of adenomatous colonic polyps 07/02/2016  . Shortness of breath 06/05/2016  . Hypothyroidism 03/17/2016  . Noncompliance with diet and medication regimen 12/16/2015  . Frequency-urgency syndrome 03/08/2015  . Subclavian artery stenosis, right ([place]) 01/16/2015  . Cholelithiasis 01/16/2015  . Restless leg syndrome 01/10/2015  . Postherpetic neuralgia 12/18/2014  . Persistent atrial fibrillation   .  Hyperlipidemia   . Essential hypertension   . Psoriasis 03/19/2014  . Vitamin D deficiency 11/13/2013  . Carotid stenosis 11/06/2013  . Psoriatic arthritis ([place]) 11/06/2013  . Hypertriglyceridemia 05/16/2013  . Morbid obesity due to excess calories ([place]) 09/10/2012  . Gout 09/10/2012  . AVM (arteriovenous malformation) of small bowel, acquired with hemorrhage 01/20/2012  . Iron deficiency anemia due to chronic blood loss 01/20/2012  . DM (diabetes mellitus), type 2 with peripheral vascular complications ([place]) 47/65/4650   Current Outpatient Medications on File Prior to Visit  Medication Sig Dispense Refill  . allopurinol (ZYLOPRIM) 100 MG tablet Take 2 tablets (276m total) by mouth daily 60 tablet 5  . amiodarone (PACERONE) 200 MG tablet Take one tablet every day except for Sunday. 90 tablet 3  . aspirin 81 MG chewable tablet Chew 1 tablet (81 mg total) by mouth daily. 90 tablet 1  . atorvastatin (LIPITOR) 40 MG tablet Take 1 tablet by mouth daily 90 tablet 3  . b complex vitamins tablet Take 1 tablet by mouth daily. 100 tablet 3  . Blood Glucose Monitoring Suppl (ONE TOUCH ULTRA 2) W/DEVICE KIT Use as directed Dx E11.9 1 each 0  . Blood Pressure Monitor KIT Use to check blood pressure daily Dx I10 1 each 0  . Cholecalciferol (VITAMIN D-3) 1000 units CAPS Take 1,000-5,000 Units by mouth See admin instructions. 1,000 units once a day on Sun/Mon/Tues/Wed/Thurs/Fri and 5,000 units on Sat    . collagenase (SANTYL) ointment Apply 1 application topically daily. 15 g 2  . diltiazem (CARTIA XT) 180 MG 24 hr capsule Take 1 capsule (180 mg total) by mouth daily. 90 capsule 1  . fenofibrate 160 MG tablet Take 1 tablet  by mouth daily (Please schedule appointment for future refills) (Patient taking differently: Take 160 mg by mouth daily. ) 90 tablet 1  . ferrous sulfate 325 (65 FE) MG tablet Take 325 mg by mouth 3 (three) times daily with meals.    . furosemide (LASIX) 20 MG tablet Take 3 tablets  (60 mg total) by mouth 2 (two) times daily. Please make office visit for future refills. 180 tablet 0  . gabapentin (NEURONTIN) 100 MG capsule Take 1 capsule by mouth twice a day 180 capsule 3  . glipiZIDE (GLUCOTROL) 10 MG tablet Take 1 tablet by mouth twice a day before meals (Patient taking differently: Take 10 mg by mouth 2 (two) times daily before a meal. ) 180 tablet 3  . glucose blood (ONETOUCH ULTRA) test strip CHECK BLOOD GLUCOSE (SUGAR) 4 TIMES DAILY DX: E11.9, Z79.4 (pt on insulin) 400 each 3  . hydrALAZINE (APRESOLINE) 50 MG tablet Take 1 tablet (50 mg total) by mouth 3 (three) times daily. 90 tablet 0  . HYDROcodone-acetaminophen (NORCO/VICODIN) 5-325 MG tablet Take 1 tablet by mouth 2 (two) times daily as needed for moderate pain. 60 tablet 0  . insulin aspart protamine - aspart (NOVOLOG 70/30 MIX) (70-30) 100 UNIT/ML FlexPen 34 units sq in am and 22 units sq in pm (Patient taking differently: 40-44 Units. 44 units sq in am and 40 units sq in pm) 15 mL 11  . Insulin Pen Needle 32G X 4 MM MISC Use to administer insulin 100 each 11  . Lancets (ONETOUCH ULTRASOFT) lancets Use to check blood sugars daily 30 each 11  . levothyroxine (SYNTHROID, LEVOTHROID) 150 MCG tablet 1 po q am (Patient taking differently: Take 150 mcg by mouth daily before breakfast. ) 90 tablet 3  . NEEDLE, DISP, 30 G (BD DISP NEEDLES) 30G X 1/2" MISC USE WITH 1 SYRINGE SUBCUTANEOUSLY  TWICE DAILY 100 each 11  . nitroGLYCERIN (NITROSTAT) 0.4 MG SL tablet Place 1 tablet under tongue for chest pain. Repeat every 5 minutes as needed, up to 3 doses. If no relief after 3 doses, call 911. (Patient taking differently: Place 0.4 mg under the tongue every 5 (five) minutes as needed for chest pain. ) 25 tablet 11  . omeprazole (PRILOSEC) 40 MG capsule Take 1 capsule by mouth daily 90 capsule 0  . potassium chloride SA (K-DUR,KLOR-CON) 20 MEQ tablet Take 1 tablet by mouth daily 90 tablet 0  . sulfamethoxazole-trimethoprim (BACTRIM)  400-80 MG tablet Take 1 tablet by mouth 2 (two) times daily. 28 tablet 0   No current facility-administered medications on file prior to visit.    Allergies  Allergen Reactions  . Percocet [Oxycodone-Acetaminophen] Nausea And Vomiting  . Metformin And Related Nausea Only    Upset stomach  . Invokana [Canagliflozin] Rash    Recent Results (from the past 2160 hour(s))  WOUND CULTURE     Status: Abnormal   Collection Time: 01/03/19 11:15 AM   Specimen: Abscess; Wound  Result Value Ref Range   MICRO NUMBER: [id number]    SPECIMEN QUALITY: Adequate    SOURCE: R FOOT, BACK OF HEEL    STATUS: FINAL    GRAM STAIN:      No white blood cells seen No epithelial cells seen Few Gram positive cocci in pairs Few Gram negative bacilli   ISOLATE 1: Staphylococcus aureus (A)     Comment: Heavy growth of Staphylococcus aureus      Susceptibility   Staphylococcus aureus - AEROBIC CULT, GRAM STAIN POSITIVE  1    VANCOMYCIN 1 Sensitive     CIPROFLOXACIN <=0.5 Sensitive     CLINDAMYCIN <=0.25 Sensitive     LEVOFLOXACIN <=0.12 Sensitive     ERYTHROMYCIN <=0.25 Sensitive     GENTAMICIN <=0.5 Sensitive     OXACILLIN* <=0.25 Sensitive      * Oxacillin-susceptible staphylococci aresusceptible to other penicillinase-stablepenicillins (e.g. Methicillin, Nafcillin), beta-lactam/beta-lactamase inhibitor combinations, andcephems with staphylococcal indications, includingCefazolin.    TETRACYCLINE <=1 Sensitive     TRIMETH/SULFA* <=10 Sensitive      * Oxacillin-susceptible staphylococci aresusceptible to other penicillinase-stablepenicillins (e.g. Methicillin, Nafcillin), beta-lactam/beta-lactamase inhibitor combinations, andcephems with staphylococcal indications, includingCefazolin.Legend:S = Susceptible  I = IntermediateR = Resistant  NS = Not susceptible* = Not tested  NR = Not reported**NN = See antimicrobic comments  POC CBG, ED     Status: Abnormal   Collection Time: 01/04/19  1:41 AM  Result Value Ref  Range   Glucose-Capillary 347 (H) 70 - 99 mg/dL  CBC with Differential/Platelet     Status: Abnormal   Collection Time: 01/04/19  1:42 AM  Result Value Ref Range   WBC 5.0 4.0 - 10.5 K/uL   RBC 3.25 (L) 4.22 - 5.81 MIL/uL   Hemoglobin 10.3 (L) 13.0 - 17.0 g/dL   HCT 30.7 (L) 39.0 - 52.0 %   MCV 94.5 80.0 - 100.0 fL   MCH 31.7 26.0 - 34.0 pg   MCHC 33.6 30.0 - 36.0 g/dL   RDW 17.2 (H) 11.5 - 15.5 %   Platelets 169 150 - 400 K/uL   nRBC 0.0 0.0 - 0.2 %   Neutrophils Relative % 83 %   Neutro Abs 4.2 1.7 - 7.7 K/uL   Lymphocytes Relative 9 %   Lymphs Abs 0.5 (L) 0.7 - 4.0 K/uL   Monocytes Relative 6 %   Monocytes Absolute 0.3 0.1 - 1.0 K/uL   Eosinophils Relative 2 %   Eosinophils Absolute 0.1 0.0 - 0.5 K/uL   Basophils Relative 0 %   Basophils Absolute 0.0 0.0 - 0.1 K/uL   Immature Granulocytes 0 %   Abs Immature Granulocytes 0.01 0.00 - 0.07 K/uL    Comment: Performed at [facility], [place] [address], [place], [place] [address]  Basic metabolic panel     Status: Abnormal   Collection Time: 01/04/19  1:42 AM  Result Value Ref Range   Sodium 135 135 - 145 mmol/L   Potassium 4.3 3.5 - 5.1 mmol/L   Chloride 106 98 - 111 mmol/L   CO2 24 22 - 32 mmol/L   Glucose, Bld 342 (H) 70 - 99 mg/dL   BUN 31 (H) 8 - 23 mg/dL   Creatinine, Ser 1.83 (H) 0.61 - 1.24 mg/dL   Calcium 8.0 (L) 8.9 - 10.3 mg/dL   GFR calc non Af Amer 35 (L) >60 mL/min   GFR calc Af Amer 41 (L) >60 mL/min   Anion gap 5 5 - 15    Comment: Performed at [facility], [place] [address], [place], [place] [address]  Brain natriuretic peptide     Status: Abnormal   Collection Time: 01/04/19  1:42 AM  Result Value Ref Range   B Natriuretic Peptide 152.1 (H) 0.0 - 100.0 pg/mL    Comment: Performed at [facility], [place] [address], [place], [place] [address]  Troponin I - ONCE - STAT     Status: Abnormal   Collection Time: 01/04/19  1:42 AM  Result Value Ref Range  Troponin I 0.04 (HH) <0.03 ng/mL    Comment: CRITICAL RESULT CALLED TO, READ BACK BY AND VERIFIED WITHAngelyn Punt RN 2202 01/04/19 A NAVARRO Performed at [facility], [place] [address] [place], [place] [address]   SARS Coronavirus 2 (CEPHEID - Performed in [place] hospital lab), Hosp Order     Status: None   Collection Time: 01/04/19  2:39 AM   Specimen: Nasopharyngeal Swab  Result Value Ref Range   SARS Coronavirus 2 NEGATIVE NEGATIVE    Comment: (NOTE) If result is NEGATIVE SARS-CoV-2 target nucleic acids are NOT DETECTED. The SARS-CoV-2 RNA is generally detectable in upper and lower  respiratory specimens during the acute phase of infection. The lowest  concentration of SARS-CoV-2 viral copies this assay can detect is 250  copies / mL. A negative result does not preclude SARS-CoV-2 infection  and should not be used as the sole basis for treatment or other  patient management decisions.  A negative result may occur with  improper specimen collection / handling, submission of specimen other  than nasopharyngeal swab, presence of viral mutation(s) within the  areas targeted by this assay, and inadequate number of viral copies  (<250 copies / mL). A negative result must be combined with clinical  observations, patient history, and epidemiological information. If result is POSITIVE SARS-CoV-2 target nucleic acids are DETECTED. The SARS-CoV-2 RNA is generally detectable in upper and lower  respiratory specimens dur ing the acute phase of infection.  Positive  results are indicative of active infection with SARS-CoV-2.  Clinical  correlation with patient history and other diagnostic information is  necessary to determine patient infection status.  Positive results do  not rule out bacterial infection or co-infection with other viruses. If result is PRESUMPTIVE POSTIVE SARS-CoV-2 nucleic acids MAY BE PRESENT.   A presumptive positive result was obtained on the  submitted specimen  and confirmed on repeat testing.  While 2019 novel coronavirus  (SARS-CoV-2) nucleic acids may be present in the submitted sample  additional confirmatory testing may be necessary for epidemiological  and / or clinical management purposes  to differentiate between  SARS-CoV-2 and other Sarbecovirus currently known to infect humans.  If clinically indicated additional testing with an alternate test  methodology [phone]) is advised. The SARS-CoV-2 RNA is generally  detectable in upper and lower respiratory sp ecimens during the acute  phase of infection. The expected result is Negative. Fact Sheet for Patients:  StrictlyIdeas.no Fact Sheet for Healthcare Providers: BankingDealers.co.za This test is not yet approved or cleared by the [place] FDA and has been authorized for detection and/or diagnosis of SARS-CoV-2 by FDA under an Emergency Use Authorization (EUA).  This EUA will remain in effect (meaning this test can be used) for the duration of the COVID-19 declaration under Section 564(b)(1) of the Act, 21 U.S.C. section 360bbb-3(b)(1), unless the authorization is terminated or revoked sooner. Performed at [facility], [place] [address], [place], [place] [address]   Comprehensive metabolic panel     Status: Abnormal   Collection Time: 01/04/19  5:42 AM  Result Value Ref Range   Sodium 137 135 - 145 mmol/L   Potassium 4.3 3.5 - 5.1 mmol/L   Chloride 107 98 - 111 mmol/L   CO2 25 22 - 32 mmol/L   Glucose, Bld 278 (H) 70 - 99 mg/dL   BUN 29 (H) 8 - 23 mg/dL   Creatinine, Ser 1.83 (H) 0.61 - 1.24 mg/dL   Calcium 8.0 (L)  8.9 - 10.3 mg/dL   Total Protein 6.7 6.5 - 8.1 g/dL   Albumin 3.8 3.5 - 5.0 g/dL   AST 23 15 - 41 U/L   ALT 22 0 - 44 U/L   Alkaline Phosphatase 38 38 - 126 U/L   Total Bilirubin 0.8 0.3 - 1.2 mg/dL   GFR calc non Af Amer 35 (L) >60 mL/min   GFR calc Af Amer 41 (L) >60 mL/min    Anion gap 5 5 - 15    Comment: Performed at [facility], [place] [address], [place], [place] [address]  Magnesium     Status: None   Collection Time: 01/04/19  5:42 AM  Result Value Ref Range   Magnesium 2.3 1.7 - 2.4 mg/dL    Comment: Performed at [facility], [place] [address], [place], [place] [address]  CBC WITH DIFFERENTIAL     Status: Abnormal   Collection Time: 01/04/19  5:42 AM  Result Value Ref Range   WBC 4.2 4.0 - 10.5 K/uL   RBC 3.25 (L) 4.22 - 5.81 MIL/uL   Hemoglobin 9.9 (L) 13.0 - 17.0 g/dL   HCT 30.6 (L) 39.0 - 52.0 %   MCV 94.2 80.0 - 100.0 fL   MCH 30.5 26.0 - 34.0 pg   MCHC 32.4 30.0 - 36.0 g/dL   RDW 17.2 (H) 11.5 - 15.5 %   Platelets 142 (L) 150 - 400 K/uL   nRBC 0.0 0.0 - 0.2 %   Neutrophils Relative % 79 %   Neutro Abs 3.3 1.7 - 7.7 K/uL   Lymphocytes Relative 10 %   Lymphs Abs 0.4 (L) 0.7 - 4.0 K/uL   Monocytes Relative 7 %   Monocytes Absolute 0.3 0.1 - 1.0 K/uL   Eosinophils Relative 2 %   Eosinophils Absolute 0.1 0.0 - 0.5 K/uL   Basophils Relative 1 %   Basophils Absolute 0.0 0.0 - 0.1 K/uL   Immature Granulocytes 1 %   Abs Immature Granulocytes 0.02 0.00 - 0.07 K/uL    Comment: Performed at [facility], [place] [address], [place], [place] [address]  Troponin I - Now Then Q6H     Status: Abnormal   Collection Time: 01/04/19  5:42 AM  Result Value Ref Range   Troponin I 0.04 (HH) <0.03 ng/mL    Comment: CRITICAL VALUE NOTED.  VALUE IS CONSISTENT WITH PREVIOUSLY REPORTED AND CALLED VALUE. Performed at [facility], [place] [address], [place], [place] [address]   TSH     Status: None   Collection Time: 01/04/19  5:43 AM  Result Value Ref Range   TSH 3.670 0.350 - 4.500 uIU/mL    Comment: Performed by a 3rd Generation assay with a functional sensitivity of <=0.01 uIU/mL. Performed at [facility], [place] [address], [place], [place] [address]   CBG monitoring,  ED     Status: Abnormal   Collection Time: 01/04/19  8:19 AM  Result Value Ref Range   Glucose-Capillary 249 (H) 70 - 99 mg/dL  ECHOCARDIOGRAM COMPLETE     Status: None   Collection Time: 01/04/19 11:36 AM  Result Value Ref Range   Weight 4,192 oz   Height 71 in   BP 142/56 mmHg  CBG monitoring, ED     Status: Abnormal   Collection Time: 01/04/19 11:43 AM  Result Value Ref Range   Glucose-Capillary 237 (H) 70 - 99 mg/dL  Troponin I - Now Then Q6H  Status: Abnormal   Collection Time: 01/04/19 12:53 PM  Result Value Ref Range   Troponin I 0.03 (HH) <0.03 ng/mL    Comment: CRITICAL VALUE NOTED.  VALUE IS CONSISTENT WITH PREVIOUSLY REPORTED AND CALLED VALUE. Performed at [facility], [place] [address], [place], [place] [address]   Glucose, capillary     Status: Abnormal   Collection Time: 01/04/19  4:46 PM  Result Value Ref Range   Glucose-Capillary 110 (H) 70 - 99 mg/dL  Troponin I - Now Then Q6H     Status: Abnormal   Collection Time: 01/04/19  4:55 PM  Result Value Ref Range   Troponin I 0.03 (HH) <0.03 ng/mL    Comment: CRITICAL VALUE NOTED.  VALUE IS CONSISTENT WITH PREVIOUSLY REPORTED AND CALLED VALUE. Performed at [facility], [place] [address], [place], [place] [address]   Glucose, capillary     Status: Abnormal   Collection Time: 01/04/19  9:48 PM  Result Value Ref Range   Glucose-Capillary 170 (H) 70 - 99 mg/dL  CBC     Status: Abnormal   Collection Time: 01/05/19  5:41 AM  Result Value Ref Range   WBC 4.0 4.0 - 10.5 K/uL   RBC 3.37 (L) 4.22 - 5.81 MIL/uL   Hemoglobin 10.2 (L) 13.0 - 17.0 g/dL   HCT 31.5 (L) 39.0 - 52.0 %   MCV 93.5 80.0 - 100.0 fL   MCH 30.3 26.0 - 34.0 pg   MCHC 32.4 30.0 - 36.0 g/dL   RDW 16.8 (H) 11.5 - 15.5 %   Platelets 143 (L) 150 - 400 K/uL   nRBC 0.0 0.0 - 0.2 %    Comment: Performed at [facility], [place] [address] [place], [place] [address]  Basic metabolic panel     Status:  Abnormal   Collection Time: 01/05/19  5:41 AM  Result Value Ref Range   Sodium 136 135 - 145 mmol/L   Potassium 4.5 3.5 - 5.1 mmol/L   Chloride 105 98 - 111 mmol/L   CO2 22 22 - 32 mmol/L   Glucose, Bld 227 (H) 70 - 99 mg/dL   BUN 28 (H) 8 - 23 mg/dL   Creatinine, Ser 1.72 (H) 0.61 - 1.24 mg/dL   Calcium 8.3 (L) 8.9 - 10.3 mg/dL   GFR calc non Af Amer 38 (L) >60 mL/min   GFR calc Af Amer 44 (L) >60 mL/min   Anion gap 9 5 - 15    Comment: Performed at [facility], [place] [address] [place], [place] [address]  Magnesium     Status: Abnormal   Collection Time: 01/05/19  5:41 AM  Result Value Ref Range   Magnesium 2.5 (H) 1.7 - 2.4 mg/dL    Comment: Performed at [facility], [place] [address], [place], [place] [address]  Glucose, capillary     Status: Abnormal   Collection Time: 01/05/19  7:29 AM  Result Value Ref Range   Glucose-Capillary 235 (H) 70 - 99 mg/dL  Glucose, capillary     Status: Abnormal   Collection Time: 01/05/19 11:48 AM  Result Value Ref Range   Glucose-Capillary 213 (H) 70 - 99 mg/dL  Glucose, capillary     Status: Abnormal   Collection Time: 01/05/19  5:05 PM  Result Value Ref Range   Glucose-Capillary 187 (H) 70 - 99 mg/dL  Glucose, capillary     Status: Abnormal   Collection Time: 01/05/19  8:59 PM  Result Value Ref Range   Glucose-Capillary 125 (H) 70 - 99 mg/dL  CBC with Differential/Platelet     Status: Abnormal   Collection Time: 01/06/19  5:39 AM  Result Value Ref Range   WBC 4.3 4.0 - 10.5 K/uL   RBC 3.53 (L) 4.22 - 5.81 MIL/uL   Hemoglobin 10.4 (L) 13.0 - 17.0 g/dL   HCT 33.4 (L) 39.0 - 52.0 %   MCV 94.6 80.0 - 100.0 fL   MCH 29.5 26.0 - 34.0 pg   MCHC 31.1 30.0 - 36.0 g/dL   RDW 16.8 (H) 11.5 - 15.5 %   Platelets 151 150 - 400 K/uL   nRBC 0.0 0.0 - 0.2 %   Neutrophils Relative % 70 %   Neutro Abs 3.1 1.7 - 7.7 K/uL   Lymphocytes Relative 15 %   Lymphs Abs 0.7 0.7 - 4.0 K/uL   Monocytes Relative 9 %    Monocytes Absolute 0.4 0.1 - 1.0 K/uL   Eosinophils Relative 4 %   Eosinophils Absolute 0.2 0.0 - 0.5 K/uL   Basophils Relative 1 %   Basophils Absolute 0.0 0.0 - 0.1 K/uL   Immature Granulocytes 1 %   Abs Immature Granulocytes 0.03 0.00 - 0.07 K/uL    Comment: Performed at [facility], [place] [address] [place], [place] [address]  Basic metabolic panel     Status: Abnormal   Collection Time: 01/06/19  5:39 AM  Result Value Ref Range   Sodium 139 135 - 145 mmol/L   Potassium 4.7 3.5 - 5.1 mmol/L   Chloride 103 98 - 111 mmol/L   CO2 24 22 - 32 mmol/L   Glucose, Bld 155 (H) 70 - 99 mg/dL   BUN 40 (H) 8 - 23 mg/dL   Creatinine, Ser 2.08 (H) 0.61 - 1.24 mg/dL   Calcium 8.6 (L) 8.9 - 10.3 mg/dL   GFR calc non Af Amer 30 (L) >60 mL/min   GFR calc Af Amer 35 (L) >60 mL/min   Anion gap 12 5 - 15    Comment: Performed at [facility], [place] [address], [place], [place] [address]  Glucose, capillary     Status: Abnormal   Collection Time: 01/06/19  7:52 AM  Result Value Ref Range   Glucose-Capillary 170 (H) 70 - 99 mg/dL  Glucose, capillary     Status: Abnormal   Collection Time: 01/06/19 12:04 PM  Result Value Ref Range   Glucose-Capillary 172 (H) 70 - 99 mg/dL    Objective: There were no vitals filed for this visit.  General: Patient is awake, alert, oriented x 3 and in no acute distress.  Dermatology: Skin is warm and dry bilateral with a ulceration present  Right heel with scab/ schar difficult to determine the dept. Ulceration measures  1x1.5 cm. There is a keratotic border with a dry eschar base. The ulceration does not  probe to bone and appears to be healing well. There is no malodor, no active drainage, no erythema, no edema. No acute signs of infection.  Short mycotic nails. No acute infection noted.   Vascular: Dorsalis Pedis pulse = 1/4 Bilateral,  Posterior Tibial pulse = 0/4 Bilateral,  Capillary Fill Time < 5 seconds, trace edema  bilateral.   Neurologic: Protective sensation diminished bilateral.   Musculosketal: There is mininal pain with palpation to ulcerated area. No pain with compression to calves bilateral.   No results for input(s): GRAMSTAIN, LABORGA in the last 8760 hours.  Assessment  and Plan:  Problem List Items Addressed This Visit      Cardiovascular and Mediastinum   Peripheral vascular disease ([place])    Other Visit Diagnoses    Pain in right foot    -  Primary   Heel ulcer, right, with unspecified severity ([place])           -Examined patient and discussed the progression of the wound and treatment alternatives that appears to be doing better - Cleansed ulceration and applied antibiotic and dry sterile dressing and instructed patient to continue with daily dressings at home consisting of santyl as Rx previously -Continue with Bactrim until completed  - Advised patient to go to the ER or return to office if the wound worsens or if constitutional symptoms are present. -Recommend offloading when in bed to right heel -Vascular studies are WNL  -Patient to return to office in 4-6 weeks for wound check/ follow up care and evaluation or sooner if problems arise.  Landis Martins, DPM

## 2019-01-18 ENCOUNTER — Ambulatory Visit (INDEPENDENT_AMBULATORY_CARE_PROVIDER_SITE_OTHER): Payer: Medicare Other | Admitting: Internal Medicine

## 2019-01-18 ENCOUNTER — Encounter: Payer: Self-pay | Admitting: Internal Medicine

## 2019-01-18 ENCOUNTER — Other Ambulatory Visit (INDEPENDENT_AMBULATORY_CARE_PROVIDER_SITE_OTHER): Payer: Medicare Other

## 2019-01-18 ENCOUNTER — Other Ambulatory Visit: Payer: Self-pay

## 2019-01-18 DIAGNOSIS — I480 Paroxysmal atrial fibrillation: Secondary | ICD-10-CM

## 2019-01-18 DIAGNOSIS — I5021 Acute systolic (congestive) heart failure: Secondary | ICD-10-CM | POA: Diagnosis not present

## 2019-01-18 DIAGNOSIS — K5521 Angiodysplasia of colon with hemorrhage: Secondary | ICD-10-CM | POA: Diagnosis not present

## 2019-01-18 DIAGNOSIS — E1151 Type 2 diabetes mellitus with diabetic peripheral angiopathy without gangrene: Secondary | ICD-10-CM

## 2019-01-18 DIAGNOSIS — I10 Essential (primary) hypertension: Secondary | ICD-10-CM | POA: Diagnosis not present

## 2019-01-18 DIAGNOSIS — E039 Hypothyroidism, unspecified: Secondary | ICD-10-CM | POA: Diagnosis not present

## 2019-01-18 DIAGNOSIS — E1140 Type 2 diabetes mellitus with diabetic neuropathy, unspecified: Secondary | ICD-10-CM | POA: Diagnosis not present

## 2019-01-18 DIAGNOSIS — R42 Dizziness and giddiness: Secondary | ICD-10-CM | POA: Diagnosis not present

## 2019-01-18 DIAGNOSIS — E559 Vitamin D deficiency, unspecified: Secondary | ICD-10-CM

## 2019-01-18 LAB — BASIC METABOLIC PANEL
BUN: 33 mg/dL — ABNORMAL HIGH (ref 6–23)
CO2: 28 mEq/L (ref 19–32)
Calcium: 8.7 mg/dL (ref 8.4–10.5)
Chloride: 101 mEq/L (ref 96–112)
Creatinine, Ser: 2.39 mg/dL — ABNORMAL HIGH (ref 0.40–1.50)
GFR: 26.56 mL/min — ABNORMAL LOW (ref >60.00)
Glucose, Bld: 188 mg/dL — ABNORMAL HIGH (ref 70–99)
Potassium: 4.7 mEq/L (ref 3.5–5.1)
Sodium: 137 mEq/L (ref 135–145)

## 2019-01-18 LAB — HEMOGLOBIN A1C: Hgb A1c MFr Bld: 6.2 % (ref 4.6–6.5)

## 2019-01-18 MED ORDER — INSULIN ASPART PROT & ASPART (70-30 MIX) 100 UNIT/ML PEN: 50.0000 [IU] | PEN_INJECTOR | Freq: Two times a day (BID) | SUBCUTANEOUS | 11 refills | Status: DC

## 2019-01-18 NOTE — Assessment & Plan Note (Signed)
Vit D 

## 2019-01-18 NOTE — Progress Notes (Signed)
Subjective:  Patient ID: Michael [name], male    DOB: 12/01/1940  Age: 78 y.o. MRN: [id number]  CC: No chief complaint on file.   HPI Michael [name] presents for DM, dyslipidemia, gout f/u. C/o dizziness on Hydralazine after d/c from hospital - cut back to Hydralazine 1 tid - better  He was hospitalized w/CHF and treated, d/c'd on 6/12:   Per hx: "Michael Adebayo Jonesis a 78 y.o.malewithhistory of CAD, diastolic CHF, A. fib, chronic kidney disease, anemia presents to the ER because of increasing shortness of breath. Patient stated over the last 2 weeks patient had been having progressive worsening shortness of breath with difficulty lying flat on the bed with orthopnea exertional shortness of breath and increasing abdominal girth and lower extremity edema. Patient stated he increased his Lasix dose twice a day over the last 1 week despite which symptoms have not improved. Has not had any chest pain productive cough fever or chills. Recently had followed up with primary care for right foot wound he sustained while closing the door.  ED Course:In the ER on exam patient has elevated JVD lower extremity edema abdominal distention and chest x-ray shows congestive heart failure pattern. There was a raised retrocardiac shadow concerning for infection but patient denies any productive cough was afebrile no leukocytosis. Lab work show hemoglobin of 10.3 BNP 152 troponin 0.04. Patient was given Lasix 40 mg IV and admitted for further management.  Hospital Course:  1 acute on chronic diastolic heart failure Likely secondary to dietary indiscretion. Patient states was taking Lasix 40 mg daily however it is noted that patient was to be taking 40 mg twice daily per cardiology's office note. Last 2D echo from July 2019 with EF of 60 to 65% with grade 2 diastolic dysfunction. Patient presented with increasing shortness of breath, lower extremity edema, orthopnea, exertional shortness of breath and  increasing abdominal girth. Patient placed on IV Lasix with clinical improvement. 2D echo from 01/04/2019 with a EF of 60 to 65%, severely increased LVH, left ventricular diastolic parameters consistent with pseudonormalization. Cardiac enzymes flattened at 0.03.  Patient was initially placed on Lasix 40 mg IV every 12 hours and subsequently dose increased to 60 mg every 12 hours.  Patient had good diuresis and clinical improvement.  Patient will be discharged home on Lasix 60 mg twice daily in addition to his cardiac regimen of aspirin, Lipitor, Cardizem.  Patient's hydralazine dose has been increased to 50 mg 3 times daily for better blood pressure control.  Patient is to follow-up with his cardiologist in the outpatient setting.  2. Hypertension Patient maintained on home regimen of Cardizem, hydralazine, Lasix.  Patient's hydralazine was increased to 50 mg every 8 hours for better blood pressure control.  Outpatient follow-up with PCP and cardiology.   3. Hyperlipidemia Patient maintained on a statin.   4. Hypothyroidism TSH of 3.670.  Patient maintained on home regimen of Synthroid.  Outpatient follow-up.  5. Chronic/persistent A. fib Remained rate controlled on amiodarone and Cardizem. Patient not an anticoagulation due to history of AVM/GI bleed.  Patient maintained on aspirin 81 mg daily.  6. Poorly controlled diabetes mellitus type 2 Patient with hemoglobin A1c of 10.0 on 09/08/2018. Patient's Lantus dose was adjusted to 44 units twice daily for better blood glucose control.  Patient's oral hypoglycemic agents were held during the hospitalization and will be resumed on discharge.  Patient was also maintained on sliding scale insulin throughout the hospitalization.  Outpatient follow-up.   7.  Chronic kidney disease stage III Remained stable.    8. Coronary artery disease Patient with slightly elevated troponins that have flattened likely secondary to problem #1.  Patient denied any chest pain.  Patient maintained on home regimen of cardiac medications of aspirin, statin, Lasix, Cardizem, hydralazine. Outpatient follow-up.  9. Right leg heel wound Secondary to trauma. Patient has been seen by the wound care nurse.   Outpatient follow-up.    Procedures:  Chest x-ray 01/04/2019,   plain films of the right foot 01/03/2019  2D echo 01/04/2019"   Outpatient Medications Prior to Visit  Medication Sig Dispense Refill  . allopurinol (ZYLOPRIM) 100 MG tablet Take 2 tablets (261m total) by mouth daily 60 tablet 5  . amiodarone (PACERONE) 200 MG tablet Take one tablet every day except for Sunday. 90 tablet 3  . aspirin 81 MG chewable tablet Chew 1 tablet (81 mg total) by mouth daily. 90 tablet 1  . atorvastatin (LIPITOR) 40 MG tablet Take 1 tablet by mouth daily 90 tablet 3  . b complex vitamins tablet Take 1 tablet by mouth daily. 100 tablet 3  . Blood Glucose Monitoring Suppl (ONE TOUCH ULTRA 2) W/DEVICE KIT Use as directed Dx E11.9 1 each 0  . Blood Pressure Monitor KIT Use to check blood pressure daily Dx I10 1 each 0  . Cholecalciferol (VITAMIN D-3) 1000 units CAPS Take 1,000-5,000 Units by mouth See admin instructions. 1,000 units once a day on Sun/Mon/Tues/Wed/Thurs/Fri and 5,000 units on Sat    . collagenase (SANTYL) ointment Apply 1 application topically daily. 15 g 2  . diltiazem (CARTIA XT) 180 MG 24 hr capsule Take 1 capsule (180 mg total) by mouth daily. 90 capsule 1  . fenofibrate 160 MG tablet Take 1 tablet by mouth daily (Please schedule appointment for future refills) (Patient taking differently: Take 160 mg by mouth daily. ) 90 tablet 1  . ferrous sulfate 325 (65 FE) MG tablet Take 325 mg by mouth 3 (three) times daily with meals.    . furosemide (LASIX) 20 MG tablet Take 3 tablets (60 mg total) by mouth 2 (two) times daily. Please make office visit for future refills. 180 tablet 0  . gabapentin (NEURONTIN) 100 MG capsule Take 1  capsule by mouth twice a day 180 capsule 3  . glipiZIDE (GLUCOTROL) 10 MG tablet Take 1 tablet by mouth twice a day before meals (Patient taking differently: Take 10 mg by mouth 2 (two) times daily before a meal. ) 180 tablet 3  . glucose blood (ONETOUCH ULTRA) test strip CHECK BLOOD GLUCOSE (SUGAR) 4 TIMES DAILY DX: E11.9, Z79.4 (pt on insulin) 400 each 3  . hydrALAZINE (APRESOLINE) 50 MG tablet Take 1 tablet (50 mg total) by mouth 3 (three) times daily. 90 tablet 0  . HYDROcodone-acetaminophen (NORCO/VICODIN) 5-325 MG tablet Take 1 tablet by mouth 2 (two) times daily as needed for moderate pain. 60 tablet 0  . insulin aspart protamine - aspart (NOVOLOG 70/30 MIX) (70-30) 100 UNIT/ML FlexPen Inject 0.5 mLs (50 Units total) into the skin 2 (two) times daily with a meal. 30 mL 11  . Insulin Pen Needle 32G X 4 MM MISC Use to administer insulin 100 each 11  . Lancets (ONETOUCH ULTRASOFT) lancets Use to check blood sugars daily 30 each 11  . levothyroxine (SYNTHROID, LEVOTHROID) 150 MCG tablet 1 po q am (Patient taking differently: Take 150 mcg by mouth daily before breakfast. ) 90 tablet 3  . NEEDLE, DISP, 30 G (BD DISP  NEEDLES) 30G X 1/2" MISC USE WITH 1 SYRINGE SUBCUTANEOUSLY  TWICE DAILY 100 each 11  . nitroGLYCERIN (NITROSTAT) 0.4 MG SL tablet Place 1 tablet under tongue for chest pain. Repeat every 5 minutes as needed, up to 3 doses. If no relief after 3 doses, call 911. (Patient taking differently: Place 0.4 mg under the tongue every 5 (five) minutes as needed for chest pain. ) 25 tablet 11  . omeprazole (PRILOSEC) 40 MG capsule Take 1 capsule by mouth daily 90 capsule 0  . potassium chloride SA (K-DUR,KLOR-CON) 20 MEQ tablet Take 1 tablet by mouth daily 90 tablet 0  . sulfamethoxazole-trimethoprim (BACTRIM) 400-80 MG tablet Take 1 tablet by mouth 2 (two) times daily. 28 tablet 0   No facility-administered medications prior to visit.     ROS: Review of Systems  Constitutional: Negative for  appetite change, fatigue and unexpected weight change.  HENT: Negative for congestion, nosebleeds, sneezing, sore throat and trouble swallowing.   Eyes: Negative for itching and visual disturbance.  Respiratory: Negative for cough.   Cardiovascular: Negative for chest pain, palpitations and leg swelling.  Gastrointestinal: Negative for abdominal distention, blood in stool, diarrhea and nausea.  Genitourinary: Negative for frequency and hematuria.  Musculoskeletal: Positive for arthralgias. Negative for back pain, gait problem, joint swelling and neck pain.  Skin: Negative for rash.  Neurological: Negative for dizziness, tremors, speech difficulty and weakness.  Psychiatric/Behavioral: Negative for agitation, dysphoric mood, sleep disturbance and suicidal ideas. The patient is not nervous/anxious.     Objective:  BP (!) 152/56 (BP Location: Left Arm, Patient Position: Sitting, Cuff Size: Large)   Pulse 65   Temp 98.3 F (36.8 C) (Oral)   Ht 5' 11"  (1.803 m)   Wt 258 lb (117 kg)   SpO2 98%   BMI 35.98 kg/m   BP Readings from Last 3 Encounters:  01/18/19 (!) 152/56  01/06/19 (!) 153/73  01/03/19 (!) 187/98    Wt Readings from Last 3 Encounters:  01/18/19 258 lb (117 kg)  01/05/19 257 lb 0.9 oz (116.6 kg)  12/20/18 255 lb (115.7 kg)    Physical Exam Constitutional:      General: He is not in acute distress.    Appearance: He is well-developed.     Comments: NAD  Eyes:     Conjunctiva/sclera: Conjunctivae normal.     Pupils: Pupils are equal, round, and reactive to light.  Neck:     Musculoskeletal: Normal range of motion.     Thyroid: No thyromegaly.     Vascular: No JVD.  Cardiovascular:     Rate and Rhythm: Normal rate and regular rhythm.     Heart sounds: Normal heart sounds. No murmur. No friction rub. No gallop.   Pulmonary:     Effort: Pulmonary effort is normal. No respiratory distress.     Breath sounds: Normal breath sounds. No wheezing or rales.  Chest:      Chest wall: No tenderness.  Abdominal:     General: Bowel sounds are normal. There is no distension.     Palpations: Abdomen is soft. There is no mass.     Tenderness: There is no abdominal tenderness. There is no guarding or rebound.  Musculoskeletal: Normal range of motion.        General: Tenderness present.  Lymphadenopathy:     Cervical: No cervical adenopathy.  Skin:    General: Skin is warm and dry.     Findings: No rash.  Neurological:  Mental Status: He is alert and oriented to person, place, and time.     Cranial Nerves: No cranial nerve deficit.     Motor: No abnormal muscle tone.     Coordination: Coordination normal.     Gait: Gait normal.     Deep Tendon Reflexes: Reflexes are normal and symmetric.  Psychiatric:        Behavior: Behavior normal.        Thought Content: Thought content normal.        Judgment: Judgment normal.    Hands w/OA   Lab Results  Component Value Date   WBC 4.3 01/06/2019   HGB 10.4 (L) 01/06/2019   HCT 33.4 (L) 01/06/2019   PLT 151 01/06/2019   GLUCOSE 155 (H) 01/06/2019   CHOL 101 08/01/2016   TRIG 344 (H) 08/01/2016   HDL 22 (L) 08/01/2016   LDLDIRECT 26.0 03/20/2015   LDLCALC 10 08/01/2016   ALT 22 01/04/2019   AST 23 01/04/2019   NA 139 01/06/2019   K 4.7 01/06/2019   CL 103 01/06/2019   CREATININE 2.08 (H) 01/06/2019   BUN 40 (H) 01/06/2019   CO2 24 01/06/2019   TSH 3.670 01/04/2019   PSA 0.50 11/07/2013   INR 1.06 12/08/2016   HGBA1C 10.0 (H) 09/08/2018   MICROALBUR 1.2 05/16/2013    Dg Chest Portable 1 View  Result Date: 01/04/2019 CLINICAL DATA:  Shortness of breath EXAM: PORTABLE CHEST 1 VIEW COMPARISON:  12/28/2017 FINDINGS: Cardiac size is enlarged. There is generalized volume overload. There may be a retrocardiac/left basilar airspace opacity. No pneumothorax. No large pleural effusion. No acute osseous abnormality. There are dense aortic calcifications. The lung volumes are low. IMPRESSION: 1. Mild  cardiac enlargement with mild volume overload. 2. Low lung volumes. 3. Possible retrocardiac/left basilar airspace opacity which may represent atelectasis or developing infiltrate. Electronically Signed   By: Constance Holster M.D.   On: 01/04/2019 02:18    Assessment & Plan:   There are no diagnoses linked to this encounter.   No orders of the defined types were placed in this encounter.    Follow-up: No follow-ups on file.  [name], MD

## 2019-01-18 NOTE — Assessment & Plan Note (Addendum)
Glimepiride - taper off and d/c Novolog 70/30 CBGs good

## 2019-01-18 NOTE — Assessment & Plan Note (Signed)
CBC

## 2019-01-18 NOTE — Telephone Encounter (Signed)
RX sent

## 2019-01-18 NOTE — Assessment & Plan Note (Signed)
Diltiazem, Toprol, Amiodarone

## 2019-01-18 NOTE — Assessment & Plan Note (Signed)
BMET 

## 2019-01-18 NOTE — Assessment & Plan Note (Signed)
dizziness on Hydralazine - cut back to 1 tid

## 2019-01-18 NOTE — Assessment & Plan Note (Signed)
TSH 

## 2019-01-18 NOTE — Addendum Note (Signed)
Addended by: Karren Cobble on: 01/18/2019 09:08 AM   Modules accepted: Orders

## 2019-01-19 ENCOUNTER — Other Ambulatory Visit: Payer: Self-pay

## 2019-01-19 ENCOUNTER — Telehealth: Payer: Self-pay

## 2019-01-19 MED ORDER — CIPROFLOXACIN HCL 500 MG PO TABS: 500.0000 mg | ORAL_TABLET | Freq: Two times a day (BID) | ORAL | 0 refills | Status: DC

## 2019-01-19 NOTE — Telephone Encounter (Signed)
Discussed with the Dr. Marisa Sprinkles adverse reactions to Bactrim and Dr. Geraldine Solar to replace Bactrim with Cipro.  I called [name] (pt's wife) to check on Pt's symptoms (improved), and informed her that we will send Cipro (500mg  BID) to [place] located at [facility]. Also, I suggested to the Pt. To talk to his PCP about his symptoms and his high blood pressure issues.  I also suggested to the Pt. To to got the emergency room if his symptoms does not improve

## 2019-01-19 NOTE — Telephone Encounter (Signed)
Patient's wife called stating that since he is started taking the antibiotic that was prescribed he has become lightheaded, dizzy, and had blurry vision.  She was calling to get advice.  I returned patient's phone call, left voicemail advising her to have her husband immediately stop taking the antibiotic, if his symptoms were severe or did not improve that he was to go to the emergency room.  We will reach back out to them with an alternate antibiotic once this is ordered from Dr. Cannon Kettle.

## 2019-01-31 ENCOUNTER — Encounter: Payer: Self-pay | Admitting: Family Medicine

## 2019-01-31 ENCOUNTER — Ambulatory Visit (INDEPENDENT_AMBULATORY_CARE_PROVIDER_SITE_OTHER): Payer: Medicare Other | Admitting: Family Medicine

## 2019-01-31 ENCOUNTER — Encounter (HOSPITAL_COMMUNITY): Payer: Self-pay | Admitting: *Deleted

## 2019-01-31 ENCOUNTER — Encounter (HOSPITAL_COMMUNITY): Payer: Self-pay | Admitting: Cardiology

## 2019-01-31 ENCOUNTER — Emergency Department (HOSPITAL_COMMUNITY): Payer: Medicare Other

## 2019-01-31 ENCOUNTER — Other Ambulatory Visit: Payer: Self-pay

## 2019-01-31 ENCOUNTER — Observation Stay (HOSPITAL_COMMUNITY)
Admission: EM | Admit: 2019-01-31 | Discharge: 2019-02-01 | Disposition: A | Discharge status: Home / Self Care | Payer: Medicare Other | Attending: Cardiology | Admitting: Cardiology

## 2019-01-31 DIAGNOSIS — I5033 Acute on chronic diastolic (congestive) heart failure: Secondary | ICD-10-CM | POA: Insufficient documentation

## 2019-01-31 DIAGNOSIS — E785 Hyperlipidemia, unspecified: Secondary | ICD-10-CM | POA: Diagnosis present

## 2019-01-31 DIAGNOSIS — I252 Old myocardial infarction: Secondary | ICD-10-CM | POA: Diagnosis not present

## 2019-01-31 DIAGNOSIS — Z7982 Long term (current) use of aspirin: Secondary | ICD-10-CM | POA: Diagnosis not present

## 2019-01-31 DIAGNOSIS — Z20828 Contact with and (suspected) exposure to other viral communicable diseases: Secondary | ICD-10-CM | POA: Diagnosis not present

## 2019-01-31 DIAGNOSIS — Z1159 Encounter for screening for other viral diseases: Secondary | ICD-10-CM | POA: Insufficient documentation

## 2019-01-31 DIAGNOSIS — R0789 Other chest pain: Secondary | ICD-10-CM | POA: Diagnosis not present

## 2019-01-31 DIAGNOSIS — E039 Hypothyroidism, unspecified: Secondary | ICD-10-CM | POA: Diagnosis not present

## 2019-01-31 DIAGNOSIS — I350 Nonrheumatic aortic (valve) stenosis: Secondary | ICD-10-CM | POA: Diagnosis not present

## 2019-01-31 DIAGNOSIS — Z79899 Other long term (current) drug therapy: Secondary | ICD-10-CM | POA: Insufficient documentation

## 2019-01-31 DIAGNOSIS — R0602 Shortness of breath: Secondary | ICD-10-CM | POA: Diagnosis not present

## 2019-01-31 DIAGNOSIS — Z8249 Family history of ischemic heart disease and other diseases of the circulatory system: Secondary | ICD-10-CM | POA: Insufficient documentation

## 2019-01-31 DIAGNOSIS — I272 Pulmonary hypertension, unspecified: Secondary | ICD-10-CM | POA: Diagnosis not present

## 2019-01-31 DIAGNOSIS — I130 Hypertensive heart and chronic kidney disease with heart failure and stage 1 through stage 4 chronic kidney disease, or unspecified chronic kidney disease: Secondary | ICD-10-CM | POA: Diagnosis not present

## 2019-01-31 DIAGNOSIS — I5032 Chronic diastolic (congestive) heart failure: Secondary | ICD-10-CM | POA: Diagnosis not present

## 2019-01-31 DIAGNOSIS — I4819 Other persistent atrial fibrillation: Secondary | ICD-10-CM | POA: Diagnosis not present

## 2019-01-31 DIAGNOSIS — I4891 Unspecified atrial fibrillation: Secondary | ICD-10-CM | POA: Diagnosis not present

## 2019-01-31 DIAGNOSIS — E669 Obesity, unspecified: Secondary | ICD-10-CM | POA: Insufficient documentation

## 2019-01-31 DIAGNOSIS — E1122 Type 2 diabetes mellitus with diabetic chronic kidney disease: Secondary | ICD-10-CM | POA: Diagnosis not present

## 2019-01-31 DIAGNOSIS — M1990 Unspecified osteoarthritis, unspecified site: Secondary | ICD-10-CM | POA: Insufficient documentation

## 2019-01-31 DIAGNOSIS — M1712 Unilateral primary osteoarthritis, left knee: Secondary | ICD-10-CM

## 2019-01-31 DIAGNOSIS — Z794 Long term (current) use of insulin: Secondary | ICD-10-CM | POA: Diagnosis not present

## 2019-01-31 DIAGNOSIS — R079 Chest pain, unspecified: Secondary | ICD-10-CM

## 2019-01-31 DIAGNOSIS — D509 Iron deficiency anemia, unspecified: Secondary | ICD-10-CM | POA: Diagnosis not present

## 2019-01-31 DIAGNOSIS — Z79890 Hormone replacement therapy (postmenopausal): Secondary | ICD-10-CM | POA: Insufficient documentation

## 2019-01-31 DIAGNOSIS — N183 Chronic kidney disease, stage 3 unspecified: Secondary | ICD-10-CM | POA: Diagnosis present

## 2019-01-31 DIAGNOSIS — L4050 Arthropathic psoriasis, unspecified: Secondary | ICD-10-CM | POA: Insufficient documentation

## 2019-01-31 DIAGNOSIS — Z87891 Personal history of nicotine dependence: Secondary | ICD-10-CM | POA: Insufficient documentation

## 2019-01-31 DIAGNOSIS — K219 Gastro-esophageal reflux disease without esophagitis: Secondary | ICD-10-CM | POA: Diagnosis not present

## 2019-01-31 DIAGNOSIS — E1151 Type 2 diabetes mellitus with diabetic peripheral angiopathy without gangrene: Secondary | ICD-10-CM | POA: Diagnosis not present

## 2019-01-31 DIAGNOSIS — Z6835 Body mass index (BMI) 35.0-35.9, adult: Secondary | ICD-10-CM | POA: Insufficient documentation

## 2019-01-31 DIAGNOSIS — I2510 Atherosclerotic heart disease of native coronary artery without angina pectoris: Secondary | ICD-10-CM

## 2019-01-31 DIAGNOSIS — M109 Gout, unspecified: Secondary | ICD-10-CM | POA: Insufficient documentation

## 2019-01-31 DIAGNOSIS — I700 Atherosclerosis of aorta: Secondary | ICD-10-CM | POA: Diagnosis not present

## 2019-01-31 DIAGNOSIS — Z955 Presence of coronary angioplasty implant and graft: Secondary | ICD-10-CM | POA: Insufficient documentation

## 2019-01-31 LAB — BASIC METABOLIC PANEL
Anion gap: 11 (ref 5–15)
BUN: 37 mg/dL — ABNORMAL HIGH (ref 8–23)
CO2: 24 mmol/L (ref 22–32)
Calcium: 9.1 mg/dL (ref 8.9–10.3)
Chloride: 105 mmol/L (ref 98–111)
Creatinine, Ser: 2.56 mg/dL — ABNORMAL HIGH (ref 0.61–1.24)
GFR calc Af Amer: 27 mL/min — ABNORMAL LOW (ref >60)
GFR calc non Af Amer: 23 mL/min — ABNORMAL LOW (ref >60)
Glucose, Bld: 192 mg/dL — ABNORMAL HIGH (ref 70–99)
Potassium: 4.1 mmol/L (ref 3.5–5.1)
Sodium: 140 mmol/L (ref 135–145)

## 2019-01-31 LAB — GLUCOSE, CAPILLARY: Glucose-Capillary: 222 mg/dL — ABNORMAL HIGH (ref 70–99)

## 2019-01-31 LAB — CBC
HCT: 34.2 % — ABNORMAL LOW (ref 39.0–52.0)
Hemoglobin: 11.7 g/dL — ABNORMAL LOW (ref 13.0–17.0)
MCH: 31 pg (ref 26.0–34.0)
MCHC: 34.2 g/dL (ref 30.0–36.0)
MCV: 90.7 fL (ref 80.0–100.0)
Platelets: 176 10*3/uL (ref 150–400)
RBC: 3.77 MIL/uL — ABNORMAL LOW (ref 4.22–5.81)
RDW: 16.1 % — ABNORMAL HIGH (ref 11.5–15.5)
WBC: 4.3 10*3/uL (ref 4.0–10.5)
nRBC: 0 % (ref 0.0–0.2)

## 2019-01-31 LAB — TROPONIN I (HIGH SENSITIVITY)
Troponin I (High Sensitivity): 102 ng/L (ref <18)
Troponin I (High Sensitivity): 158 ng/L (ref <18)
Troponin I (High Sensitivity): 277 ng/L (ref <18)
Troponin I (High Sensitivity): 272 ng/L (ref <18)

## 2019-01-31 LAB — CBG MONITORING, ED: Glucose-Capillary: 152 mg/dL — ABNORMAL HIGH (ref 70–99)

## 2019-01-31 LAB — TSH: TSH: 2.863 u[IU]/mL (ref 0.350–4.500)

## 2019-01-31 LAB — SARS CORONAVIRUS 2 BY RT PCR (HOSPITAL ORDER, PERFORMED IN ~~LOC~~ HOSPITAL LAB): SARS Coronavirus 2: NEGATIVE

## 2019-01-31 LAB — PROTIME-INR
INR: 1.1 (ref 0.8–1.2)
Prothrombin Time: 14.2 seconds (ref 11.4–15.2)

## 2019-01-31 LAB — MAGNESIUM: Magnesium: 2.4 mg/dL (ref 1.7–2.4)

## 2019-01-31 LAB — PHOSPHORUS: Phosphorus: 4.6 mg/dL (ref 2.5–4.6)

## 2019-01-31 LAB — APTT: aPTT: 32 seconds (ref 24–36)

## 2019-01-31 MED ORDER — ASPIRIN 300 MG RE SUPP
300.0000 mg | RECTAL | Status: AC
  Filled 2019-01-31: qty 1

## 2019-01-31 MED ORDER — GLIPIZIDE 5 MG PO TABS
5.0000 mg | ORAL_TABLET | Freq: Every day | ORAL | Status: DC
  Filled 2019-01-31 (×2): qty 1

## 2019-01-31 MED ORDER — ALLOPURINOL 100 MG PO TABS
200.0000 mg | ORAL_TABLET | Freq: Every day | ORAL | Status: DC
  Administered 2019-02-01: 200 mg via ORAL
  Filled 2019-01-31: qty 2

## 2019-01-31 MED ORDER — POTASSIUM CHLORIDE CRYS ER 20 MEQ PO TBCR
20.0000 meq | EXTENDED_RELEASE_TABLET | Freq: Every day | ORAL | Status: DC
  Filled 2019-01-31: qty 1

## 2019-01-31 MED ORDER — FUROSEMIDE 40 MG PO TABS
60.0000 mg | ORAL_TABLET | Freq: Two times a day (BID) | ORAL | Status: DC
  Administered 2019-01-31: 60 mg via ORAL
  Filled 2019-01-31: qty 1

## 2019-01-31 MED ORDER — ACETAMINOPHEN 325 MG PO TABS: 650.0000 mg | ORAL_TABLET | ORAL | Status: DC | PRN

## 2019-01-31 MED ORDER — ONDANSETRON HCL 4 MG/2ML IJ SOLN: 4.0000 mg | Freq: Four times a day (QID) | INTRAMUSCULAR | Status: DC | PRN

## 2019-01-31 MED ORDER — FERROUS SULFATE 325 (65 FE) MG PO TABS
325.0000 mg | ORAL_TABLET | Freq: Three times a day (TID) | ORAL | Status: DC
  Administered 2019-02-01 (×2): 325 mg via ORAL
  Filled 2019-01-31 (×2): qty 1

## 2019-01-31 MED ORDER — HYDRALAZINE HCL 50 MG PO TABS
50.0000 mg | ORAL_TABLET | Freq: Three times a day (TID) | ORAL | Status: DC
  Administered 2019-01-31 – 2019-02-01 (×2): 50 mg via ORAL
  Filled 2019-01-31 (×2): qty 1

## 2019-01-31 MED ORDER — DILTIAZEM LOAD VIA INFUSION
15.0000 mg | Freq: Once | INTRAVENOUS | Status: DC
  Filled 2019-01-31: qty 15

## 2019-01-31 MED ORDER — ASPIRIN EC 81 MG PO TBEC
81.0000 mg | DELAYED_RELEASE_TABLET | Freq: Every day | ORAL | Status: DC
  Administered 2019-02-01: 81 mg via ORAL
  Filled 2019-01-31: qty 1

## 2019-01-31 MED ORDER — DILTIAZEM HCL-DEXTROSE 100-5 MG/100ML-% IV SOLN (PREMIX)
5.0000 mg/h | INTRAVENOUS | Status: DC
  Filled 2019-01-31: qty 100

## 2019-01-31 MED ORDER — ASPIRIN 81 MG PO CHEW
243.0000 mg | CHEWABLE_TABLET | ORAL | Status: AC
  Administered 2019-01-31: 243 mg via ORAL
  Filled 2019-01-31: qty 3

## 2019-01-31 MED ORDER — NITROGLYCERIN 0.4 MG SL SUBL: 0.4000 mg | SUBLINGUAL_TABLET | SUBLINGUAL | Status: DC | PRN

## 2019-01-31 MED ORDER — DILTIAZEM HCL ER COATED BEADS 180 MG PO CP24
180.0000 mg | ORAL_CAPSULE | Freq: Every day | ORAL | Status: DC
  Administered 2019-02-01: 180 mg via ORAL
  Filled 2019-01-31: qty 1

## 2019-01-31 MED ORDER — LEVOTHYROXINE SODIUM 75 MCG PO TABS
150.0000 ug | ORAL_TABLET | Freq: Every day | ORAL | Status: DC
  Administered 2019-02-01: 150 ug via ORAL
  Filled 2019-01-31: qty 2

## 2019-01-31 MED ORDER — GABAPENTIN 100 MG PO CAPS
100.0000 mg | ORAL_CAPSULE | Freq: Two times a day (BID) | ORAL | Status: DC
  Administered 2019-01-31 – 2019-02-01 (×2): 100 mg via ORAL
  Filled 2019-01-31 (×2): qty 1

## 2019-01-31 MED ORDER — SODIUM CHLORIDE 0.9% FLUSH: 3.0000 mL | Freq: Once | INTRAVENOUS | Status: DC

## 2019-01-31 MED ORDER — FENOFIBRATE 160 MG PO TABS
160.0000 mg | ORAL_TABLET | Freq: Every day | ORAL | Status: DC
  Administered 2019-02-01: 160 mg via ORAL
  Filled 2019-01-31: qty 1

## 2019-01-31 MED ORDER — AMIODARONE HCL 200 MG PO TABS
200.0000 mg | ORAL_TABLET | Freq: Every day | ORAL | Status: DC
  Administered 2019-02-01: 200 mg via ORAL
  Filled 2019-01-31: qty 1

## 2019-01-31 MED ORDER — INSULIN ASPART PROT & ASPART (70-30 MIX) 100 UNIT/ML ~~LOC~~ SUSP
50.0000 [IU] | Freq: Two times a day (BID) | SUBCUTANEOUS | Status: DC
  Filled 2019-01-31: qty 10

## 2019-01-31 MED ORDER — PANTOPRAZOLE SODIUM 40 MG PO TBEC
40.0000 mg | DELAYED_RELEASE_TABLET | Freq: Every day | ORAL | Status: DC
  Administered 2019-02-01: 40 mg via ORAL
  Filled 2019-01-31: qty 1

## 2019-01-31 MED ORDER — METOPROLOL TARTRATE 12.5 MG HALF TABLET
12.5000 mg | ORAL_TABLET | Freq: Two times a day (BID) | ORAL | Status: DC
  Administered 2019-01-31 – 2019-02-01 (×2): 12.5 mg via ORAL
  Filled 2019-01-31 (×2): qty 1

## 2019-01-31 MED ORDER — INSULIN ASPART 100 UNIT/ML ~~LOC~~ SOLN
0.0000 [IU] | Freq: Three times a day (TID) | SUBCUTANEOUS | Status: DC
  Administered 2019-02-01: 3 [IU] via SUBCUTANEOUS

## 2019-01-31 MED ORDER — ATORVASTATIN CALCIUM 40 MG PO TABS
40.0000 mg | ORAL_TABLET | Freq: Every day | ORAL | Status: DC
  Administered 2019-02-01: 40 mg via ORAL
  Filled 2019-01-31: qty 1

## 2019-01-31 NOTE — ED Provider Notes (Signed)
[facility] EMERGENCY DEPARTMENT Provider Note   CSN: [id number] Arrival date & time: 01/31/19  1119    History   Chief Complaint Chief Complaint  Patient presents with   Weakness   Atrial Fibrillation    HPI Michael [name] is a 78 y.o. male with past medical history of diastolic heart failure, A. fib with RVR on amiodarone and aspirin, CAD, diabetes, PAD presents emergency department today with chief complaint of weakness and A. fib x1 day.  Patient states while sitting outside having his coffee this morning he began to feel short of breath and weak.  He checked his heart rate and it was 120, this was at 6 AM.  Patient states an hour later he was in the kitchen and checked his heart rate again and it was now 70.  He had an appointment already scheduled with his primary care provider so he went and was sent here for further evaluation of his A. Fib. Is also reporting associated mild chest tightness.  Tightness is located in the center of his chest, rating it 4 out of 10 in severity.  Tightness does not radiate.He states this feels like his typical chest pain.  He denies any fever, cough, abdominal pain, nausea, vomiting, urinary symptoms, diarrhea.  Patient is not anticoagulated, history of GI bleed while on anitcogoagulation years ago. Cardiologist is Dr. Lovena Le.  Past Medical History:  Diagnosis Date   Acute on chronic diastolic (congestive) heart failure ([place]) 02/05/2017   Arthritis    AVM (arteriovenous malformation) of colon    AVM (arteriovenous malformation) of small bowel, acquired with hemorrhage 01/20/2012   Found on small bowel capsule endoscopy, June 2013    Carotid artery disease ([place])    a. Carotid dopple 03/2014 59-29% RICA &  24-46% LICA stenosis b. 2/86   CKD (chronic kidney disease), stage III (HCC)    Clostridium difficile colitis 05/13/2016   Coronary artery disease involving native coronary artery of native heart without angina pectoris  07/31/2016   Lopressor - d/c 7/19, Lipitor and ASA   GERD (gastroesophageal reflux disease)    Gout    H/O transfusion of whole blood    Hx of adenomatous colonic polyps 07/02/2016   Hyperlipidemia    Hypertension    Hypothyroidism    Iron deficiency anemia    Nonrheumatic aortic valve stenosis    NSTEMI (non-ST elevated myocardial infarction) ([place])    hx/notes 06/25/2017   PAD (peripheral artery disease) ([place]) 07/31/2016   On ASA, Plavix   Personal history of colonic polyps 2007, 2008   adenoma each time, largest 12 mm in 2007   Psoriasis    Septic phlebitis of upper extremity 08/26/2016   2018 L hand Dr Megan Salon f/u Keflex and Vanc po   Staph infection 07/2016   left hand    Subclavian artery stenosis, right ([place]) 01/16/2015   Type II diabetes mellitus ([place])    TYPE 2    Patient Active Problem List   Diagnosis Date Noted   Chest pain 01/31/2019   Dizzinesses 01/18/2019   Open wound of heel, right, sequela    Acute CHF (congestive heart failure) ([place]) 01/04/2019   Open leg wound 12/16/2018   Degenerative arthritis of left knee 10/18/2018   Peripheral vascular disease ([place]) 10/18/2018   Atrial fibrillation with rapid ventricular response ([place]) 06/25/2017   Atrial fibrillation with RVR ([place]) 06/25/2017   GERD (gastroesophageal reflux disease) 02/05/2017   PAF (paroxysmal atrial fibrillation) ([place]) 02/05/2017  CKD (chronic kidney disease), stage III ([name]) 02/05/2017   Acute on chronic diastolic (congestive) heart failure ([place]) 02/05/2017   History of GI bleed 08/26/2016   Septic phlebitis of upper extremity 08/26/2016   Coronary artery disease involving native coronary artery of native heart without angina pectoris 07/31/2016   PAD (peripheral artery disease) ([place]) 07/31/2016   Septic thrombophlebitis of upper extremity 07/31/2016   History of Clostridium difficile colitis 07/31/2016   Nonrheumatic aortic valve stenosis    Chronic  diastolic CHF (congestive heart failure) ([place]) 07/27/2016   Hx of adenomatous colonic polyps 07/02/2016   Shortness of breath 06/05/2016   Hypothyroidism 03/17/2016   Noncompliance with diet and medication regimen 12/16/2015   Frequency-urgency syndrome 03/08/2015   Subclavian artery stenosis, right ([place]) 01/16/2015   Cholelithiasis 01/16/2015   Restless leg syndrome 01/10/2015   Postherpetic neuralgia 12/18/2014   Persistent atrial fibrillation    Hyperlipidemia    Essential hypertension    Psoriasis 03/19/2014   Vitamin D deficiency 11/13/2013   Carotid stenosis 11/06/2013   Psoriatic arthritis ([place]) 11/06/2013   Hypertriglyceridemia 05/16/2013   Morbid obesity due to excess calories ([place]) 09/10/2012   Gout 09/10/2012   AVM (arteriovenous malformation) of small bowel, acquired with hemorrhage 01/20/2012   Iron deficiency anemia due to chronic blood loss 01/20/2012   DM (diabetes mellitus), type 2 with peripheral vascular complications ([place]) 39/53/2023    Past Surgical History:  Procedure Laterality Date   APPENDECTOMY  02/09/2014   CARDIAC CATHETERIZATION N/A 07/29/2016   Procedure: Right/Left Heart Cath and Coronary Angiography;  Surgeon: Nelva Bush, MD;  Location: [facility];  Service: Cardiovascular;  Laterality: N/A;   CARDIAC CATHETERIZATION N/A 07/29/2016   Procedure: Coronary Stent Intervention;  Surgeon: Nelva Bush, MD;  Location: [facility];  Service: Cardiovascular;  Laterality: N/A;   CARDIAC CATHETERIZATION  10/2014   COLONOSCOPY  02/10/2011   internal hemorrhoids   COLONOSCOPY W/ POLYPECTOMY  04/09/2006   12 mm adenoma   COLONOSCOPY W/ POLYPECTOMY  06/17/2007   5 mm adenoma   COLONOSCOPY WITH PROPOFOL N/A 05/13/2016   Procedure: COLONOSCOPY WITH PROPOFOL;  Surgeon: Manus Gunning, MD;  Location: [facility];  Service: Gastroenterology;  Laterality: N/A;   ENTEROSCOPY N/A 08/14/2016   Procedure:  ENTEROSCOPY;  Surgeon: Manus Gunning, MD;  Location: [facility] ENDOSCOPY;  Service: Gastroenterology;  Laterality: N/A;   ESOPHAGOGASTRODUODENOSCOPY  12/23/2011   Procedure: ESOPHAGOGASTRODUODENOSCOPY (EGD);  Surgeon: Irene Shipper, MD;  Location: [facility]OS[facility];  Service: Endoscopy;  Laterality: N/A;  with small bowel bx's   GIVENS CAPSULE STUDY  12/28/2011   GIVENS CAPSULE STUDY N/A 08/14/2016   Procedure: GIVENS CAPSULE STUDY;  Surgeon: Manus Gunning, MD;  Location: [facility] ENDOSCOPY;  Service: Gastroenterology;  Laterality: N/A;   KNEE ARTHROSCOPY Right    LAPAROSCOPIC APPENDECTOMY N/A 02/09/2014   Procedure: APPENDECTOMY LAPAROSCOPIC;  Surgeon: Leighton Ruff, MD;  Location: [facility];  Service: General;  Laterality: N/A;   LEFT HEART CATHETERIZATION WITH CORONARY ANGIOGRAM N/A 11/15/2014   Procedure: LEFT HEART CATHETERIZATION WITH CORONARY ANGIOGRAM;  Surgeon: Belva Crome, MD;  Location: [facility] CATH LAB;  Service: Cardiovascular;  Laterality: N/A;   SHOULDER OPEN ROTATOR CUFF REPAIR Left         Home Medications    Prior to Admission medications   Medication Sig Start Date End Date Taking? Authorizing Provider  allopurinol (ZYLOPRIM) 100 MG tablet Take 2 tablets (268m total) by mouth daily 09/28/18  Yes [name], AEvie Lacks MD  amiodarone (PACERONE) 200 MG tablet Take one tablet every day except for Sunday. 03/18/18  Yes Evans Lance, MD  aspirin 81 MG chewable tablet Chew 1 tablet (81 mg total) by mouth daily. 04/28/17  Yes [name], Evie Lacks, MD  atorvastatin (LIPITOR) 40 MG tablet Take 1 tablet by mouth daily 03/17/18  Yes [name], Evie Lacks, MD  b complex vitamins tablet Take 1 tablet by mouth daily. 04/14/17  Yes [name], Evie Lacks, MD  Cholecalciferol (VITAMIN D-3) 1000 units CAPS Take 1,000-5,000 Units by mouth See admin instructions. 1,000 units once a day on Sun/Mon/Tues/Wed/Thurs/Fri and 5,000 units on Sat   Yes [provider]  collagenase (SANTYL) ointment  Apply 1 application topically daily. 01/03/19  Yes Stover, Titorya, DPM  diltiazem (CARTIA XT) 180 MG 24 hr capsule Take 1 capsule (180 mg total) by mouth daily. 08/17/18  Yes Josue Hector, MD  fenofibrate 160 MG tablet Take 1 tablet by mouth daily (Please schedule appointment for future refills) Patient taking differently: Take 160 mg by mouth daily.  09/09/18  Yes Josue Hector, MD  ferrous sulfate 325 (65 FE) MG tablet Take 325 mg by mouth 3 (three) times daily with meals.   Yes [provider]  furosemide (LASIX) 20 MG tablet Take 3 tablets (60 mg total) by mouth 2 (two) times daily. Please make office visit for future refills. 01/06/19  Yes Eugenie Filler, MD  gabapentin (NEURONTIN) 100 MG capsule Take 1 capsule by mouth twice a day Patient taking differently: Take 100 mg by mouth 2 (two) times daily.  03/17/18  Yes [name], Evie Lacks, MD  glipiZIDE (GLUCOTROL) 10 MG tablet Take 5 mg by mouth daily before breakfast.   Yes [provider]  hydrALAZINE (APRESOLINE) 50 MG tablet Take 1 tablet (50 mg total) by mouth 3 (three) times daily. 01/06/19  Yes Eugenie Filler, MD  insulin aspart protamine - aspart (NOVOLOG 70/30 MIX) (70-30) 100 UNIT/ML FlexPen Inject 0.5 mLs (50 Units total) into the skin 2 (two) times daily with a meal. 01/18/19  Yes [name], Evie Lacks, MD  levothyroxine (SYNTHROID, LEVOTHROID) 150 MCG tablet 1 po q am Patient taking differently: Take 150 mcg by mouth daily before breakfast.  02/25/18  Yes [name], Evie Lacks, MD  nitroGLYCERIN (NITROSTAT) 0.4 MG SL tablet Place 1 tablet under tongue for chest pain. Repeat every 5 minutes as needed, up to 3 doses. If no relief after 3 doses, call 911. Patient taking differently: Place 0.4 mg under the tongue every 5 (five) minutes as needed for chest pain.  03/18/18  Yes Josue Hector, MD  omeprazole (PRILOSEC) 40 MG capsule Take 1 capsule by mouth daily Patient taking differently: Take 40 mg by mouth daily.   11/08/17  Yes [name], Evie Lacks, MD  potassium chloride SA (K-DUR,KLOR-CON) 20 MEQ tablet Take 1 tablet by mouth daily Patient taking differently: Take 20 mEq by mouth daily.  11/08/17  Yes [name], Evie Lacks, MD  Blood Glucose Monitoring Suppl (ONE TOUCH ULTRA 2) W/DEVICE KIT Use as directed Dx E11.9 05/18/14   [name], Evie Lacks, MD  Blood Pressure Monitor KIT Use to check blood pressure daily Dx I10 10/04/15   [name], Evie Lacks, MD  glucose blood (ONETOUCH ULTRA) test strip CHECK BLOOD GLUCOSE (SUGAR) 4 TIMES DAILY DX: E11.9, Z79.4 (pt on insulin) 12/28/18   [name], Evie Lacks, MD  HYDROcodone-acetaminophen (NORCO/VICODIN) 5-325 MG tablet Take 1 tablet by mouth 2 (two) times daily as needed for moderate pain. Patient  not taking: Reported on 01/31/2019 09/28/18 09/28/19  [name], Evie Lacks, MD  Insulin Pen Needle 32G X 4 MM MISC Use to administer insulin 09/16/18   [name], Evie Lacks, MD  Lancets [facility] ULTRASOFT) lancets Use to check blood sugars daily 05/18/14   [name], Evie Lacks, MD  NEEDLE, DISP, 30 G (BD DISP NEEDLES) 30G X 1/2" MISC USE WITH 1 SYRINGE SUBCUTANEOUSLY  TWICE DAILY 09/15/18   [name], Evie Lacks, MD    Family History Family History  Problem Relation Age of Onset   Heart attack Father    Lung cancer Father    Diabetes Father    Stroke Father    Hypertension Father    Stroke Mother    Hypertension Mother    Cancer Brother        liver   Heart disease Brother        chf   COPD Sister    Malignant hyperthermia Neg Hx     Social History Social History   Tobacco Use   Smoking status: Former Smoker    Packs/day: 0.70    Years: 48.00    Pack years: 33.60    Types: Cigarettes    Quit date: 03/29/2006    Years since quitting: 12.8   Smokeless tobacco: Never Used  Substance Use Topics   Alcohol use: Yes    Alcohol/week: 1.0 standard drinks    Types: 1 Cans of beer per week    Comment: occasional alcohol intake   Drug use: No      Allergies   Percocet [oxycodone-acetaminophen], Ciprofloxacin, Metformin and related, and Invokana [canagliflozin]   Review of Systems Review of Systems  Constitutional: Negative for chills and fever.  HENT: Negative for congestion, rhinorrhea, sinus pressure and sore throat.   Eyes: Negative for pain and redness.  Respiratory: Positive for shortness of breath. Negative for cough and wheezing.   Cardiovascular: Positive for chest pain and leg swelling. Negative for palpitations.  Gastrointestinal: Negative for abdominal pain, constipation, diarrhea, nausea and vomiting.  Genitourinary: Negative for dysuria.  Musculoskeletal: Negative for arthralgias, back pain, myalgias and neck pain.  Skin: Negative for rash and wound.  Neurological: Negative for dizziness, syncope, weakness, numbness and headaches.  Psychiatric/Behavioral: Negative for confusion.     Physical Exam Updated Vital Signs BP 131/84    Pulse (!) 124    Temp 97.9 F (36.6 C) (Oral)    Resp 11    SpO2 98%   Physical Exam Vitals signs and nursing note reviewed.  Constitutional:      General: He is not in acute distress.    Appearance: He is obese. He is not ill-appearing.  HENT:     Head: Normocephalic and atraumatic.     Right Ear: Tympanic membrane and external ear normal.     Left Ear: Tympanic membrane and external ear normal.     Nose: Nose normal.     Mouth/Throat:     Mouth: Mucous membranes are moist.     Pharynx: Oropharynx is clear.  Eyes:     General: No scleral icterus.       Right eye: No discharge.        Left eye: No discharge.     Extraocular Movements: Extraocular movements intact.     Conjunctiva/sclera: Conjunctivae normal.     Pupils: Pupils are equal, round, and reactive to light.  Neck:     Musculoskeletal: Normal range of motion.     Vascular: No JVD.  Cardiovascular:  Rate and Rhythm: Tachycardia present. Rhythm irregularly irregular.     Pulses: Normal pulses.           Radial pulses are 2+ on the right side and 2+ on the left side.     Heart sounds: Normal heart sounds.  Pulmonary:     Comments: Lungs clear to auscultation in all fields. Symmetric chest rise. No wheezing, rales, or rhonchi. Abdominal:     Comments: Abdomen is soft, non-distended, and non-tender in all quadrants. No rigidity, no guarding. No peritoneal signs.  Musculoskeletal: Normal range of motion.     Right lower leg: 2+ Pitting Edema present.     Left lower leg: 2+ Pitting Edema present.  Skin:    General: Skin is warm and dry.     Capillary Refill: Capillary refill takes less than 2 seconds.  Neurological:     Mental Status: He is oriented to person, place, and time.     GCS: GCS eye subscore is 4. GCS verbal subscore is 5. GCS motor subscore is 6.     Comments: Fluent speech, no facial droop.  Psychiatric:        Behavior: Behavior normal.      ED Treatments / Results  Labs (all labs ordered are listed, but only abnormal results are displayed) Labs Reviewed  BASIC METABOLIC PANEL - Abnormal; Notable for the following components:      Result Value   Glucose, Bld 192 (*)    BUN 37 (*)    Creatinine, Ser 2.56 (*)    GFR calc non Af Amer 23 (*)    GFR calc Af Amer 27 (*)    All other components within normal limits  CBC - Abnormal; Notable for the following components:   RBC 3.77 (*)    Hemoglobin 11.7 (*)    HCT 34.2 (*)    RDW 16.1 (*)    All other components within normal limits  TROPONIN I (HIGH SENSITIVITY) - Abnormal; Notable for the following components:   Troponin I (High Sensitivity) 102 (*)    All other components within normal limits  TROPONIN I (HIGH SENSITIVITY) - Abnormal; Notable for the following components:   Troponin I (High Sensitivity) 158 (*)    All other components within normal limits  TROPONIN I (HIGH SENSITIVITY) - Abnormal; Notable for the following components:   Troponin I (High Sensitivity) 277 (*)    All other components within normal  limits  TROPONIN I (HIGH SENSITIVITY) - Abnormal; Notable for the following components:   Troponin I (High Sensitivity) 272 (*)    All other components within normal limits  GLUCOSE, CAPILLARY - Abnormal; Notable for the following components:   Glucose-Capillary 222 (*)    All other components within normal limits  CBG MONITORING, ED - Abnormal; Notable for the following components:   Glucose-Capillary 152 (*)    All other components within normal limits  SARS CORONAVIRUS 2 (HOSPITAL ORDER, [facility])  MAGNESIUM  TSH  APTT  PROTIME-INR  PHOSPHORUS  BASIC METABOLIC PANEL  LIPID PANEL  CBC    EKG EKG Interpretation  Date/Time:  Tuesday January 31 2019 12:06:53 EDT Ventricular Rate:  123 PR Interval:    QRS Duration: 107 QT Interval:  344 QTC Calculation: 493 R Axis:   97 Text Interpretation:  Atrial fibrillation Right axis deviation Anteroseptal infarct, old Repol abnrm suggests ischemia, diffuse leads QRS morpholy and t waves unchanged.afib RVR. Confirmed by Charlesetta Shanks [phone]) on 01/31/2019 12:13:27  PM   EKG Interpretation  Date/Time:  Tuesday January 31 2019 13:19:37 EDT Ventricular Rate:  64 PR Interval:    QRS Duration: 109 QT Interval:  433 QTC Calculation: 447 R Axis:   82 Text Interpretation:  Sinus rhythm Borderline right axis deviation Anteroseptal infarct, old Borderline repol abnormality, diffuse leads now sinus rythm, otherwise the same. Confirmed by Charlesetta Shanks [phone]) on 01/31/2019 1:41:27 PM     Radiology Dg Chest 2 View  Result Date: 01/31/2019 CLINICAL DATA:  Mild chest tightness, shortness of breath and fatigue, atrial fibrillation, CHF, diabetes mellitus, coronary artery disease post MI, hypertension EXAM: CHEST - 2 VIEW COMPARISON:  01/04/2019, 12/28/2017 FINDINGS: Upper normal heart size. Mediastinal contours and pulmonary vascularity normal. Atherosclerotic calcification aorta. Lungs clear. Chronic blunting of the LEFT  lateral costophrenic angle by either pleural fluid or thickening. On lateral view, chronic pleural based opacity is posteriorly, likely at the posterior lower LEFT lung, question loculated pleural effusion; this is unchanged since 12/28/2017. Bones unremarkable. IMPRESSION: Chronic LEFT pleural effusion versus pleural thickening at the LEFT base, and likely loculated at the posterior lower LEFT chest, unchanged since 12/28/2017. No definite acute abnormalities. Electronically Signed   By: Lavonia Dana M.D.   On: 01/31/2019 11:53    Procedures Procedures (including critical care time)  Medications Ordered in ED Medications  sodium chloride flush (NS) 0.9 % injection 3 mL (has no administration in time range)  allopurinol (ZYLOPRIM) tablet 200 mg (has no administration in time range)  amiodarone (PACERONE) tablet 200 mg (has no administration in time range)  atorvastatin (LIPITOR) tablet 40 mg (has no administration in time range)  diltiazem (CARDIZEM CD) 24 hr capsule 180 mg (has no administration in time range)  fenofibrate tablet 160 mg (has no administration in time range)  furosemide (LASIX) tablet 60 mg (60 mg Oral Given 01/31/19 1930)  hydrALAZINE (APRESOLINE) tablet 50 mg (has no administration in time range)  glipiZIDE (GLUCOTROL) tablet 5 mg (has no administration in time range)  insulin aspart protamine- aspart (NOVOLOG MIX 70/30) injection 50 Units (has no administration in time range)  levothyroxine (SYNTHROID) tablet 150 mcg (has no administration in time range)  pantoprazole (PROTONIX) EC tablet 40 mg (has no administration in time range)  ferrous sulfate tablet 325 mg (has no administration in time range)  gabapentin (NEURONTIN) capsule 100 mg (has no administration in time range)  potassium chloride SA (K-DUR) CR tablet 20 mEq (has no administration in time range)  aspirin EC tablet 81 mg (has no administration in time range)  nitroGLYCERIN (NITROSTAT) SL tablet 0.4 mg (has no  administration in time range)  acetaminophen (TYLENOL) tablet 650 mg (has no administration in time range)  ondansetron (ZOFRAN) injection 4 mg (has no administration in time range)  metoprolol tartrate (LOPRESSOR) tablet 12.5 mg (has no administration in time range)  insulin aspart (novoLOG) injection 0-15 Units (0 Units Subcutaneous Not Given 01/31/19 1918)  aspirin chewable tablet 243 mg (243 mg Oral Given 01/31/19 1930)    Or  aspirin suppository 300 mg ( Rectal See Alternative 01/31/19 1930)     Initial Impression / Assessment and Plan / ED Course  I have reviewed the triage vital signs and the nursing notes.  Pertinent labs & imaging results that were available during my care of the patient were reviewed by me and considered in my medical decision making (see chart for details).  78 yo male presents with chest pressure, shortness of breath, afib on amiodarone. On arrival he  is non toxic appearing, in no acute distress. EKG shows afib without ischemic changes. Lung sounds are clear through out. Signs of volume overload with bilateral 2+ pitting lower extremity edema.    CHA2DS2-VASc score of 6: for age, sex, CHF history, htn history, vascular disease history.  Cardiac and a fib work up initiated. Results significant for Troponin of 102. BMP with worsening renal function with BUN/Creatinine of 37/2.56, compared to 33/2.39 x 13 days ago. Patient has spontaneously converted to sinus rhythm. This case was discussed with Dr. Johnney Killian who has seen the patient and agrees with plan to admit. Given elevated troponin and afib case discussed with cardiologist Dr. Percival Spanish  who agrees to assume care of patient and bring into the hospital for further evaluation and management.    Final Clinical Impressions(s) / ED Diagnoses   Final diagnoses:  Chest pain, unspecified type    ED Discharge Orders    None       Flint Melter 01/31/19 2129    Charlesetta Shanks, MD 02/05/19 [phone]

## 2019-01-31 NOTE — ED Provider Notes (Signed)
Medical screening examination/treatment/procedure(s) were conducted as a shared visit with non-physician practitioner(s) and myself.  I personally evaluated the patient during the encounter.  EKG Interpretation  Date/Time:  Tuesday January 31 2019 13:19:37 EDT Ventricular Rate:  64 PR Interval:    QRS Duration: 109 QT Interval:  433 QTC Calculation: 447 R Axis:   82 Text Interpretation:  Sinus rhythm Borderline right axis deviation Anteroseptal infarct, old Borderline repol abnormality, diffuse leads now sinus rythm, otherwise the same. Confirmed by Charlesetta Shanks [phone]) on 01/31/2019 1:41:27 PM  She has history of atrial fibrillation but is not chronically in A. fib.  He had an episode this morning of feeling his heart start to race.  He also had shortness of breath and general weakness.  He identified his heart rate to be 120.  Does chronically take amiodarone and aspirin.  Patient does have history of GI bleed on anticoagulants a couple years ago requiring transfusion.  Patient has spontaneously converted to sinus rhythm.  He is alert and appropriate.  Heart regular no gross rub murmur gallop.  No respiratory distress lungs grossly clear.  Abdomen obese and mildly distended.  2+ pitting edema bilateral lower extremities.  Cognitive function motor function grossly intact.  Patient did have chest tightness at the time of his symptoms.  Troponin is elevated at 102.  Patient's age and comorbid illness will need cardiology consultation.  I agree with plan of management.   Charlesetta Shanks, MD 01/31/19 1345

## 2019-01-31 NOTE — Progress Notes (Signed)
[name] [place] [place], [place] [address] Phone: [phone] Subjective:   I Michael [name] am serving as a Education administrator for Dr. Hulan Saas.  I'm seeing this patient by the request  of:    CC: Knee pain follow-up  BZM:[id number]     Update 01/31/2019: Michael [name] is a 78 y.o. male coming in with complaint of left knee pain. Wife is concerned with blood pressure.  Patient has been slightly more short of breath.  Feeling a little lightheaded.  Does not feel like himself.  Has had a history of atrial fibrillation.  Patient's wife is concerned that he is in it at the moment.  Patient is unable to take any type of blood thinners on a regular basis secondary to an AV malformation.  Regarding patient's knee patient does have osteoarthritic changes but feels that his him better since the injection.  Heel pain is doing significantly better with antibiotics.  Seems to be healing well with very minimal discomfort.  Past Medical History:  Diagnosis Date   Acute on chronic diastolic (congestive) heart failure ([place]) 02/05/2017   Acute on chronic heart failure (HCC)    Allergy    Arthritis    Atrial fibrillation with RVR ([place]) 06/25/2017   hx/notes 06/25/2017   AVM (arteriovenous malformation) of colon    AVM (arteriovenous malformation) of small bowel, acquired with hemorrhage 01/20/2012   Found on small bowel capsule endoscopy, June 2013    C. difficile enteritis    CAD (coronary artery disease)    a. Cath 10/2014 - Mild to moderate diagonal, circumflex and obtuse marginal disease. Innominate artery sternosis.   Carotid artery disease ([place])    a. Carotid dopple 03/2014 24-49% RICA &  75-30% LICA stenosis b. 0/51   Carotid stenosis 11/06/2013   2014 B moderate On ASA, Plavix, Lipitor   Chronic diastolic CHF (congestive heart failure) ([place]) 07/27/2016   2017 Diltiazem, Toprol, Furosemide, Hydralazine, Amiodarone   CKD (chronic kidney disease),  stage III (HCC)    Clostridium difficile colitis 05/13/2016   Coronary artery disease involving native coronary artery of native heart without angina pectoris 07/31/2016   Lopressor - d/c 7/19, Lipitor and ASA   DM (diabetes mellitus), type 2 with peripheral vascular complications ([place]) 07/29/1115   Chronic on Metformin d/c,  Glipizide 2016 Invokana - stopped On [place] since 2017   Dyspnea    Dysrhythmia    ATRIAL FIBRILATION   Elevated troponin    a. 09/2014 in setting of AF RVR-->Myoview: EF 49%, no ischemia/infarct->Med Rx.   Essential hypertension    On Cardizem, toprol - stopped 7/19, furosemide - trial off acei 06/05/2016 due to doe not fully explained > improved 07/07/2016  On diovan 160 mg daily      GERD (gastroesophageal reflux disease)    Gout    H/O transfusion of whole blood    Heart murmur    History of PFTs    PFTs 6/16:  FVC 3.73 (87%), FEV1 2.93 (88%), FEV1/FVC 78%, DLCO 69%   Hx of adenomatous colonic polyps 07/02/2016   Hyperlipidemia    Hypertension    Hypertriglyceridemia 05/16/2013   Hypothyroidism    Iron deficiency anemia    Nonrheumatic aortic valve stenosis    NSTEMI (non-ST elevated myocardial infarction) ([place])    hx/notes 06/25/2017   PAD (peripheral artery disease) ([place]) 07/31/2016   On ASA, Plavix   PAF (paroxysmal atrial fibrillation) ([place])    a. 09/2014: Converted on  Dilt;  b. CHA2DS2VASc = 3-->eliquis;  c. 09/2014 Echo: EF 55-60%, mild LVH, mildly dil LA.   Persistent atrial fibrillation    a. 09/2014: Converted on Dilt;  b. CHA2DS2VASc = 3-->eliquis;  c. 09/2014 Echo: EF 55-60%, mild LVH, mildly dil LA.  2018 stopped Eliquis On amiodarone, cardizem On ASA and Plavix   Personal history of colonic polyps 2007, 2008   adenoma each time, largest 12 mm in 2007   Psoriasis    Septic phlebitis of upper extremity 08/26/2016   2018 L hand Dr Megan Salon f/u Keflex and Vanc po   Septic thrombophlebitis of upper extremity 07/31/2016   Staph  infection 07/2016   left hand    Staphylococcus aureus bacteremia 07/31/2016   Subclavian artery stenosis, right ([place]) 01/16/2015   Type II diabetes mellitus ([place])    TYPE 2   Past Surgical History:  Procedure Laterality Date   APPENDECTOMY  02/09/2014   CARDIAC CATHETERIZATION N/A 07/29/2016   Procedure: Right/Left Heart Cath and Coronary Angiography;  Surgeon: Nelva Bush, MD;  Location: [facility];  Service: Cardiovascular;  Laterality: N/A;   CARDIAC CATHETERIZATION N/A 07/29/2016   Procedure: Coronary Stent Intervention;  Surgeon: Nelva Bush, MD;  Location: [facility];  Service: Cardiovascular;  Laterality: N/A;   CARDIAC CATHETERIZATION  10/2014   COLONOSCOPY  02/10/2011   internal hemorrhoids   COLONOSCOPY W/ POLYPECTOMY  04/09/2006   12 mm adenoma   COLONOSCOPY W/ POLYPECTOMY  06/17/2007   5 mm adenoma   COLONOSCOPY WITH PROPOFOL N/A 05/13/2016   Procedure: COLONOSCOPY WITH PROPOFOL;  Surgeon: Manus Gunning, MD;  Location: [facility];  Service: Gastroenterology;  Laterality: N/A;   ENTEROSCOPY N/A 08/14/2016   Procedure: ENTEROSCOPY;  Surgeon: Manus Gunning, MD;  Location: [facility] ENDOSCOPY;  Service: Gastroenterology;  Laterality: N/A;   ESOPHAGOGASTRODUODENOSCOPY  12/23/2011   Procedure: ESOPHAGOGASTRODUODENOSCOPY (EGD);  Surgeon: Irene Shipper, MD;  Location: [facility];  Service: Endoscopy;  Laterality: N/A;  with small bowel bx's   GIVENS CAPSULE STUDY  12/28/2011   GIVENS CAPSULE STUDY N/A 08/14/2016   Procedure: GIVENS CAPSULE STUDY;  Surgeon: Manus Gunning, MD;  Location[facility];  Service: Gastroenterology;  Laterality: N/A;   KNEE ARTHROSCOPY Right    LAPAROSCOPIC APPENDECTOMY N/A 02/09/2014   Procedure: APPENDECTOMY LAPAROSCOPIC;  Surgeon: Leighton Ruff, MD;  Location: [facility];  Service: General;  Laterality: N/A;   LEFT HEART CATHETERIZATION WITH CORONARY ANGIOGRAM N/A 11/15/2014   Procedure: LEFT HEART  CATHETERIZATION WITH CORONARY ANGIOGRAM;  Surgeon: Belva Crome, MD;  Location: [facility] CATH LAB;  Service: Cardiovascular;  Laterality: N/A;   SHOULDER OPEN ROTATOR CUFF REPAIR Left    Social History   Socioeconomic History   Marital status: Married    Spouse name: Not on file   Number of children: Not on file   Years of education: 12   Highest education level: Not on file  Occupational History   Occupation:  Retired  Scientist, product/process development strain: Not on file   Food insecurity    Worry: Not on file    Inability: Not on Lexicographer needs    Medical: Not on file    Non-medical: Not on file  Tobacco Use   Smoking status: Former Smoker    Packs/day: 0.70    Years: 48.00    Pack years: 33.60    Types: Cigarettes    Quit date: 03/29/2006    Years since quitting: 12.8  Smokeless tobacco: Never Used  Substance and Sexual Activity   Alcohol use: Yes    Alcohol/week: 1.0 standard drinks    Types: 1 Cans of beer per week    Comment: occasional alcohol intake   Drug use: No   Sexual activity: Not Currently  Lifestyle   Physical activity    Days per week: Not on file    Minutes per session: Not on file   Stress: Not on file  Relationships   Social connections    Talks on phone: Not on file    Gets together: Not on file    Attends religious service: Not on file    Active member of club or organization: Not on file    Attends meetings of clubs or organizations: Not on file    Relationship status: Not on file  Other Topics Concern   Not on file  Social History Narrative   Regular exercise-no   Caffeine Use-yes   Allergies  Allergen Reactions   Percocet [Oxycodone-Acetaminophen] Nausea And Vomiting   Metformin And Related Nausea Only    Upset stomach   Invokana [Canagliflozin] Rash   Family History  Problem Relation Age of Onset   Heart attack Father    Lung cancer Father    Diabetes Father    Stroke Father     Hypertension Father    Stroke Mother    Hypertension Mother    Cancer Brother        liver   Heart disease Brother        chf   COPD Sister    Malignant hyperthermia Neg Hx      Current Outpatient Medications (Endocrine & Metabolic):    insulin aspart protamine - aspart (NOVOLOG 70/30 MIX) (70-30) 100 UNIT/ML FlexPen, Inject 0.5 mLs (50 Units total) into the skin 2 (two) times daily with a meal.   levothyroxine (SYNTHROID, LEVOTHROID) 150 MCG tablet, 1 po q am (Patient taking differently: Take 150 mcg by mouth daily before breakfast. )   Current Outpatient Medications (Cardiovascular):    amiodarone (PACERONE) 200 MG tablet, Take one tablet every day except for Sunday.   atorvastatin (LIPITOR) 40 MG tablet, Take 1 tablet by mouth daily   diltiazem (CARTIA XT) 180 MG 24 hr capsule, Take 1 capsule (180 mg total) by mouth daily.   fenofibrate 160 MG tablet, Take 1 tablet by mouth daily (Please schedule appointment for future refills) (Patient taking differently: Take 160 mg by mouth daily. )   furosemide (LASIX) 20 MG tablet, Take 3 tablets (60 mg total) by mouth 2 (two) times daily. Please make office visit for future refills.   hydrALAZINE (APRESOLINE) 50 MG tablet, Take 1 tablet (50 mg total) by mouth 3 (three) times daily.   nitroGLYCERIN (NITROSTAT) 0.4 MG SL tablet, Place 1 tablet under tongue for chest pain. Repeat every 5 minutes as needed, up to 3 doses. If no relief after 3 doses, call 911. (Patient taking differently: Place 0.4 mg under the tongue every 5 (five) minutes as needed for chest pain. )     Current Outpatient Medications (Analgesics):    allopurinol (ZYLOPRIM) 100 MG tablet, Take 2 tablets (255m total) by mouth daily   aspirin 81 MG chewable tablet, Chew 1 tablet (81 mg total) by mouth daily.   HYDROcodone-acetaminophen (NORCO/VICODIN) 5-325 MG tablet, Take 1 tablet by mouth 2 (two) times daily as needed for moderate pain.   Current Outpatient  Medications (Hematological):    ferrous sulfate 325 (65 FE)  MG tablet, Take 325 mg by mouth 3 (three) times daily with meals.   Current Outpatient Medications (Other):    b complex vitamins tablet, Take 1 tablet by mouth daily.   Blood Glucose Monitoring Suppl (ONE TOUCH ULTRA 2) W/DEVICE KIT, Use as directed Dx E11.9   Blood Pressure Monitor KIT, Use to check blood pressure daily Dx I10   Cholecalciferol (VITAMIN D-3) 1000 units CAPS, Take 1,000-5,000 Units by mouth See admin instructions. 1,000 units once a day on Sun/Mon/Tues/Wed/Thurs/Fri and 5,000 units on Sat   ciprofloxacin (CIPRO) 500 MG tablet, Take 1 tablet (500 mg total) by mouth 2 (two) times daily.   collagenase (SANTYL) ointment, Apply 1 application topically daily.   gabapentin (NEURONTIN) 100 MG capsule, Take 1 capsule by mouth twice a day   glucose blood (ONETOUCH ULTRA) test strip, CHECK BLOOD GLUCOSE (SUGAR) 4 TIMES DAILY DX: E11.9, Z79.4 (pt on insulin)   Insulin Pen Needle 32G X 4 MM MISC, Use to administer insulin   Lancets (ONETOUCH ULTRASOFT) lancets, Use to check blood sugars daily   NEEDLE, DISP, 30 G (BD DISP NEEDLES) 30G X 1/2" MISC, USE WITH 1 SYRINGE SUBCUTANEOUSLY  TWICE DAILY   omeprazole (PRILOSEC) 40 MG capsule, Take 1 capsule by mouth daily   potassium chloride SA (K-DUR,KLOR-CON) 20 MEQ tablet, Take 1 tablet by mouth daily   sulfamethoxazole-trimethoprim (BACTRIM) 400-80 MG tablet, Take 1 tablet by mouth 2 (two) times daily.  Facility-Administered Medications Ordered in Other Visits (Other):    sodium chloride flush (NS) 0.9 % injection 3 mL No current facility-administered medications for this visit.     Past medical history, social, surgical and family history all reviewed in electronic medical record.  No pertanent information unless stated regarding to the chief complaint.   Review of Systems:  No headache, visual changes, nausea, vomiting, diarrhea, constipation, dizziness,  abdominal pain, skin rash, fevers, chills, night sweats, weight loss, swollen lymph nodes, body aches, joint swelling, , chest pain, shortness of breath, mood changes.  Positive shortness of breath mild chest discomfort  Objective  Blood pressure 104/60, pulse (!) 103, height _0  (1.803 m), weight 258 lb (117 kg), SpO2 96 %.   General: No apparent distress alert and oriented x3 mood and affect normal, dressed appropriately.  HEENT: Pupils equal, extraocular movements intact  Respiratory: Patient is somewhat short of breath at baseline. Cardiovascular: 1+ lower extremity edema, hemosiderin deposits of the lower extremities bilaterally with decreased dorsalis pedis pulses missing to the heart patient is seen regularly irregular.  Extremities or on axial skeleton.  Abdomen: Soft nontender  Neuro: Cranial nerves II through XII are intact, Lymph: No lymphadenopathy of posterior or anterior cervical chain or axillae bilaterally.  Gait antalgic MSK:  tender with limited range of motion and good stability and symmetric strength and tone of shoulders, elbows, wrist, hip, and ankles bilaterally.   Knee: Left valgus deformity noted.  Abnormal thigh to calf ratio.  Tender to palpation over medial and PF joint line.  ROM full in flexion and extension and lower leg rotation. instability with valgus force.  painful patellar compression. Patellar glide with moderate crepitus. Patellar and quadriceps tendons unremarkable. Hamstring and quadriceps strength is normal.     Impression and Recommendations:     This case required medical decision making of moderate complexity. The above documentation has been reviewed and is accurate and complete Michael Pulley, DO       Note: This dictation was prepared with Dragon dictation along with  smaller phrase technology. Any transcriptional errors that result from this process are unintentional.

## 2019-01-31 NOTE — Plan of Care (Signed)
  Problem: Education: Goal: Ability to demonstrate management of disease process will improve Outcome: Progressing   Problem: Activity: Goal: Capacity to carry out activities will improve Outcome: Progressing   Problem: Cardiac: Goal: Ability to achieve and maintain adequate cardiopulmonary perfusion will improve Outcome: Progressing   Problem: Education: Goal: Knowledge of General Education information will improve Description: Including pain rating scale, medication(s)/side effects and non-pharmacologic comfort measures Outcome: Progressing   

## 2019-01-31 NOTE — ED Notes (Signed)
MD at the bedside  

## 2019-01-31 NOTE — Assessment & Plan Note (Signed)
Patient on exam today did have a heart rate as high as 130 bpm and is irregular.  Patient has had atrial fibrillation with RVR.  Patient states that he has been in rhythm for quite some times but is symptomatic with a congestive heart failure as well previously.  Encourage patient to go to the emergency room immediately.  We discussed the possibility of a EMS which patient declined.  Patient's wife took him over to the emergency room immediately.  We discussed that this will need further evaluation.  We did discuss with patient's primary care provider with this plan as well which he agreed.

## 2019-01-31 NOTE — Progress Notes (Signed)
1830 pt arrived to floor, sr per CCMD on telemetry, pt oriented to unit, call light and diet tray ordered, denies pain at this time

## 2019-01-31 NOTE — Progress Notes (Signed)
[name] called for update, advised pt was not having pain and in sr at this time, plan is to watch is labs tonight and will evaluate if any tests in am, all questions entertained and answered

## 2019-01-31 NOTE — H&P (Signed)
Cardiology Admission Note   Patient ID: Michael [name]; [id number]; 11-08-1940   Admit date: 01/31/2019 Date of Consult: 01/31/2019  Primary Care Provider: Cassandria Anger, MD Primary Cardiologist:   Dr. Johnsie Cancel Primary Electrophysiologist:  Dr. Lovena Le  Patient Profile:   Michael [name] is a 78 y.o. male with a hx of atrial who is being seen today for the evaluation of atrial fib at the request of Dr. Johnney Killian  History of Present Illness:   Michael [name] s a 78 y.o. male with a h/o HTN, obesity and CAD with PAF controlled on amiodarone and chronic diastolic HF.   In 2018 he was admitted with with a non-STEMI. Cardiac catheterization demonstrated 80% hazy stenosis involving the proximal OM1 felt to be the culprit lesion. This was treated with a Synergy DES. There was mild to moderate nonobstructive disease in the diagonal, LAD, AV groove circumflex and RCA. He had mildly elevated left and right heart filling pressures as well as mild pulmonary hypertension. He was readmitted 1/15-1/21/18 with acute on chronic diastolic CHF in the setting of worsening heme+ anemia. He was found to have AVMs.  He was admitted again in Dec of that year with rapid atrial fib requiring amiodarone.   He was hospitalized in June with volume overload.  He was diuresed and was discharged on a higher dose of diuretic and increased hydralazine.    He comes to the ED today after visiting an MD office and being noted to be in atrial fib.  He says he noticed this in the morning when he went to feed the squirrel a cookie.  He says he has had short paroxysms of this but that it usually goes away.  This did not go away.  He felt like he was running a race.  He has since felt fatigued.  However, he was not having chest pain and had no increased SOB.  He had no associated symptoms. He had no presyncope or syncope.    He was in atrial fib and converted to NSR spontaneously.  However, his hsTrop was mildly elevated as below.  He had no  acute EKG changes.  He says that otherwise he has felt well.      Past Medical History:  Diagnosis Date  . Acute on chronic diastolic (congestive) heart failure ([place]) 02/05/2017  . Arthritis   . AVM (arteriovenous malformation) of colon   . AVM (arteriovenous malformation) of small bowel, acquired with hemorrhage 01/20/2012   Found on small bowel capsule endoscopy, June 2013   . Carotid artery disease ([place])    a. Carotid dopple 03/2014 76-72% RICA &  09-47% LICA stenosis b. 0/96  . CKD (chronic kidney disease), stage III ([place])   . Clostridium difficile colitis 05/13/2016  . Coronary artery disease involving native coronary artery of native heart without angina pectoris 07/31/2016   Lopressor - d/c 7/19, Lipitor and ASA  . GERD (gastroesophageal reflux disease)   . Gout   . H/O transfusion of whole blood   . Hx of adenomatous colonic polyps 07/02/2016  . Hyperlipidemia   . Hypertension   . Hypothyroidism   . Iron deficiency anemia   . Nonrheumatic aortic valve stenosis   . NSTEMI (non-ST elevated myocardial infarction) ([place])    hx/notes 06/25/2017  . PAD (peripheral artery disease) ([place]) 07/31/2016   On ASA, Plavix  . Personal history of colonic polyps 2007, 2008   adenoma each time, largest 12 mm in 2007  . Psoriasis   .  Septic phlebitis of upper extremity 08/26/2016   2018 L hand Dr Megan Salon f/u Keflex and Vanc po  . Staph infection 07/2016   left hand   . Subclavian artery stenosis, right ([place]) 01/16/2015  . Type II diabetes mellitus ([place])    TYPE 2    Past Surgical History:  Procedure Laterality Date  . APPENDECTOMY  02/09/2014  . CARDIAC CATHETERIZATION N/A 07/29/2016   Procedure: Right/Left Heart Cath and Coronary Angiography;  Surgeon: Nelva Bush, MD;  Location: [facility];  Service: Cardiovascular;  Laterality: N/A;  . CARDIAC CATHETERIZATION N/A 07/29/2016   Procedure: Coronary Stent Intervention;  Surgeon: Nelva Bush, MD;  Location: [facility];   Service: Cardiovascular;  Laterality: N/A;  . CARDIAC CATHETERIZATION  10/2014  . COLONOSCOPY  02/10/2011   internal hemorrhoids  . COLONOSCOPY W/ POLYPECTOMY  04/09/2006   12 mm adenoma  . COLONOSCOPY W/ POLYPECTOMY  06/17/2007   5 mm adenoma  . COLONOSCOPY WITH PROPOFOL N/A 05/13/2016   Procedure: COLONOSCOPY WITH PROPOFOL;  Surgeon: Manus Gunning, MD;  Location: [facility];  Service: Gastroenterology;  Laterality: N/A;  . ENTEROSCOPY N/A 08/14/2016   Procedure: ENTEROSCOPY;  Surgeon: Manus Gunning, MD;  Location[facility];  Service: Gastroenterology;  Laterality: N/A;  . ESOPHAGOGASTRODUODENOSCOPY  12/23/2011   Procedure: ESOPHAGOGASTRODUODENOSCOPY (EGD);  Surgeon: Irene Shipper, MD;  Location: [facility];  Service: Endoscopy;  Laterality: N/A;  with small bowel bx's  . GIVENS CAPSULE STUDY  12/28/2011  . GIVENS CAPSULE STUDY N/A 08/14/2016   Procedure: GIVENS CAPSULE STUDY;  Surgeon: Manus Gunning, MD;  Location: [facility];  Service: Gastroenterology;  Laterality: N/A;  . KNEE ARTHROSCOPY Right   . LAPAROSCOPIC APPENDECTOMY N/A 02/09/2014   Procedure: APPENDECTOMY LAPAROSCOPIC;  Surgeon: Leighton Ruff, MD;  Location: [facility];  Service: General;  Laterality: N/A;  . LEFT HEART CATHETERIZATION WITH CORONARY ANGIOGRAM N/A 11/15/2014   Procedure: LEFT HEART CATHETERIZATION WITH CORONARY ANGIOGRAM;  Surgeon: Belva Crome, MD;  Location: [facility];  Service: Cardiovascular;  Laterality: N/A;  . SHOULDER OPEN ROTATOR CUFF REPAIR Left      Prior to Admission medications   Medication Sig Start Date End Date Taking? Authorizing Provider  allopurinol (ZYLOPRIM) 100 MG tablet Take 2 tablets (212m total) by mouth daily 09/28/18  Yes [name], AEvie Lacks MD  amiodarone (PACERONE) 200 MG tablet Take one tablet every day except for Sunday. 03/18/18  Yes TEvans Lance MD  aspirin 81 MG chewable tablet Chew 1 tablet (81 mg total) by mouth daily. 04/28/17  Yes [name],  AEvie Lacks MD  atorvastatin (LIPITOR) 40 MG tablet Take 1 tablet by mouth daily 03/17/18  Yes [name], AEvie Lacks MD  b complex vitamins tablet Take 1 tablet by mouth daily. 04/14/17  Yes [name], AEvie Lacks MD  Cholecalciferol (VITAMIN D-3) 1000 units CAPS Take 1,000-5,000 Units by mouth See admin instructions. 1,000 units once a day on Sun/Mon/Tues/Wed/Thurs/Fri and 5,000 units on Sat   Yes [provider]  collagenase (SANTYL) ointment Apply 1 application topically daily. 01/03/19  Yes Stover, Titorya, DPM  diltiazem (CARTIA XT) 180 MG 24 hr capsule Take 1 capsule (180 mg total) by mouth daily. 08/17/18  Yes NJosue Hector MD  fenofibrate 160 MG tablet Take 1 tablet by mouth daily (Please schedule appointment for future refills) Patient taking differently: Take 160 mg by mouth daily.  09/09/18  Yes NJosue Hector MD  ferrous sulfate 325 (65 FE) MG tablet Take 325  mg by mouth 3 (three) times daily with meals.   Yes [provider]  furosemide (LASIX) 20 MG tablet Take 3 tablets (60 mg total) by mouth 2 (two) times daily. Please make office visit for future refills. 01/06/19  Yes Eugenie Filler, MD  gabapentin (NEURONTIN) 100 MG capsule Take 1 capsule by mouth twice a day Patient taking differently: Take 100 mg by mouth 2 (two) times daily.  03/17/18  Yes [name], Evie Lacks, MD  glipiZIDE (GLUCOTROL) 10 MG tablet Take 5 mg by mouth daily before breakfast.   Yes [provider]  hydrALAZINE (APRESOLINE) 50 MG tablet Take 1 tablet (50 mg total) by mouth 3 (three) times daily. 01/06/19  Yes Eugenie Filler, MD  insulin aspart protamine - aspart (NOVOLOG 70/30 MIX) (70-30) 100 UNIT/ML FlexPen Inject 0.5 mLs (50 Units total) into the skin 2 (two) times daily with a meal. 01/18/19  Yes [name], Evie Lacks, MD  levothyroxine (SYNTHROID, LEVOTHROID) 150 MCG tablet 1 po q am Patient taking differently: Take 150 mcg by mouth daily before breakfast.  02/25/18  Yes [name],  Evie Lacks, MD  nitroGLYCERIN (NITROSTAT) 0.4 MG SL tablet Place 1 tablet under tongue for chest pain. Repeat every 5 minutes as needed, up to 3 doses. If no relief after 3 doses, call 911. Patient taking differently: Place 0.4 mg under the tongue every 5 (five) minutes as needed for chest pain.  03/18/18  Yes Josue Hector, MD  omeprazole (PRILOSEC) 40 MG capsule Take 1 capsule by mouth daily Patient taking differently: Take 40 mg by mouth daily.  11/08/17  Yes [name], Evie Lacks, MD  potassium chloride SA (K-DUR,KLOR-CON) 20 MEQ tablet Take 1 tablet by mouth daily Patient taking differently: Take 20 mEq by mouth daily.  11/08/17  Yes [name], Evie Lacks, MD  Blood Glucose Monitoring Suppl (ONE TOUCH ULTRA 2) W/DEVICE KIT Use as directed Dx E11.9 05/18/14   [name], Evie Lacks, MD  Blood Pressure Monitor KIT Use to check blood pressure daily Dx I10 10/04/15   [name], Evie Lacks, MD  glucose blood (ONETOUCH ULTRA) test strip CHECK BLOOD GLUCOSE (SUGAR) 4 TIMES DAILY DX: E11.9, Z79.4 (pt on insulin) 12/28/18   [name], Evie Lacks, MD  HYDROcodone-acetaminophen (NORCO/VICODIN) 5-325 MG tablet Take 1 tablet by mouth 2 (two) times daily as needed for moderate pain. Patient not taking: Reported on 01/31/2019 09/28/18 09/28/19  [name], Evie Lacks, MD  Insulin Pen Needle 32G X 4 MM MISC Use to administer insulin 09/16/18   [name], Evie Lacks, MD  Lancets [facility] ULTRASOFT) lancets Use to check blood sugars daily 05/18/14   [name], Evie Lacks, MD  NEEDLE, DISP, 30 G (BD DISP NEEDLES) 30G X 1/2" MISC USE WITH 1 SYRINGE SUBCUTANEOUSLY  TWICE DAILY 09/15/18   [name], Evie Lacks, MD     Inpatient Medications: Scheduled Meds: . sodium chloride flush  3 mL Intravenous Once   Continuous Infusions:  PRN Meds:   Allergies:    Allergies  Allergen Reactions  . Percocet [Oxycodone-Acetaminophen] Nausea And Vomiting  . Ciprofloxacin Other (See Comments)    Light headed and made stagger  . Metformin  And Related Nausea Only    Upset stomach  . Invokana [Canagliflozin] Rash    Social History:   Social History   Socioeconomic History  . Marital status: Married    Spouse name: Not on file  . Number of children: Not on file  . Years of education: 87  . Highest education level: Not on  file  Occupational History  . Occupation:  Retired  Scientific laboratory technician  . Financial resource strain: Not on file  . Food insecurity    Worry: Not on file    Inability: Not on file  . Transportation needs    Medical: Not on file    Non-medical: Not on file  Tobacco Use  . Smoking status: Former Smoker    Packs/day: 0.70    Years: 48.00    Pack years: 33.60    Types: Cigarettes    Quit date: 03/29/2006    Years since quitting: 12.8  . Smokeless tobacco: Never Used  Substance and Sexual Activity  . Alcohol use: Yes    Alcohol/week: 1.0 standard drinks    Types: 1 Cans of beer per week    Comment: occasional alcohol intake  . Drug use: No  . Sexual activity: Not Currently  Lifestyle  . Physical activity    Days per week: Not on file    Minutes per session: Not on file  . Stress: Not on file  Relationships  . Social Herbalist on phone: Not on file    Gets together: Not on file    Attends religious service: Not on file    Active member of club or organization: Not on file    Attends meetings of clubs or organizations: Not on file    Relationship status: Not on file  . Intimate partner violence    Fear of current or ex partner: Not on file    Emotionally abused: Not on file    Physically abused: Not on file    Forced sexual activity: Not on file  Other Topics Concern  . Not on file  Social History Narrative   Regular exercise-no   Caffeine Use-yes    Family History:    Family History  Problem Relation Age of Onset  . Heart attack Father   . Lung cancer Father   . Diabetes Father   . Stroke Father   . Hypertension Father   . Stroke Mother   . Hypertension Mother   .  Cancer Brother        liver  . Heart disease Brother        chf  . COPD Sister   . Malignant hyperthermia Neg Hx      ROS:  Please see the history of present illness.  As stated in the HPI and negative for all other systems.  Physical Exam/Data:   Vitals:   01/31/19 1415 01/31/19 1515 01/31/19 1530 01/31/19 1545  BP: 128/68 123/65 (!) 112/53 125/63  Pulse: 77 65 68 70  Resp: _0 Temp:      TempSrc:      SpO2: 95% 96% 96% 98%   No intake or output data in the 24 hours ending 01/31/19 1610 There were no vitals filed for this visit. There is no height or weight on file to calculate BMI.  GENERAL:  Well appearing HEENT:   Pupils equal round and reactive, fundi not visualized, oral mucosa unremarkable NECK:  No  jugular venous distention, waveform within normal limits, carotid upstroke brisk and symmetric, no bruits, no thyromegaly LYMPHATICS:  No cervical, inguinal adenopathy LUNGS:   Clear to auscultation bilaterally BACK:  No CVA tenderness CHEST:   Unremarkable HEART:  PMI not displaced or sustained,S1 and S2 within normal limits, no S3, no S4, no clicks, no rubs, 3/6 apical systolic murmur radiating out the outflow tract, no  diastolic murmurs ABD:  Flat, positive bowel sounds normal in frequency in pitch, no bruits, no rebound, no guarding, no midline pulsatile mass, no hepatomegaly, no splenomegaly EXT:  2 plus pulses throughout, trace edema, no cyanosis no clubbing SKIN:  No rashes no nodules NEURO:   Cranial nerves II through XII grossly intact, motor grossly intact throughout PSYCH:    Cognitively intact, oriented to person place and time   EKG:  The EKG was personally reviewed and demonstrates:  NSR, rate 64, IVCD, non specific ST T wave changes.  No change from previous.    Telemetry:  Telemetry was personally reviewed and demonstrates:  NA  Relevant CV Studies:  ECHO:     1. The left ventricle has normal systolic function with an ejection fraction of  60-65%. The cavity size was normal. There is severely increased left ventricular wall thickness. Left ventricular diastolic Doppler parameters are consistent with  pseudonormalization. Elevated left ventricular end-diastolic pressure The E/e' is 30.  2. The right ventricle has normal systolic function. The cavity was normal. There is no increase in right ventricular wall thickness.  3. Left atrial size was severely dilated.  4. Right atrial size was mildly dilated.  5. The mitral valve is grossly normal. There is mild mitral annular calcification present.  6. The aortic valve was not well visualized. Moderate calcification of the aortic valve. Mild-moderate stenosis of the aortic valve. Mean systolic gradient approximately 26.5 mmHg.  7. The aortic root and ascending aorta are normal in size and structure.  8. The inferior vena cava was dilated in size with >50% respiratory variability.   Laboratory Data:  Chemistry Recent Labs  Lab 01/31/19 1126  NA 140  K 4.1  CL 105  CO2 24  GLUCOSE 192*  BUN 37*  CREATININE 2.56*  CALCIUM 9.1  GFRNONAA 23*  GFRAA 27*  ANIONGAP 11    No results for input(s): PROT, ALBUMIN, AST, ALT, ALKPHOS, BILITOT in the last 168 hours. Hematology Recent Labs  Lab 01/31/19 1126  WBC 4.3  RBC 3.77*  HGB 11.7*  HCT 34.2*  MCV 90.7  MCH 31.0  MCHC 34.2  RDW 16.1*  PLT 176   Lab Results  Component Value Date   HGBA1C 6.2 01/18/2019    Cardiac EnzymesNo results for input(s): TROPONINI in the last 168 hours. No results for input(s): TROPIPOC in the last 168 hours.  BNPNo results for input(s): BNP, PROBNP in the last 168 hours.  DDimer No results for input(s): DDIMER in the last 168 hours.  Radiology/Studies:  Dg Chest 2 View  Result Date: 01/31/2019 CLINICAL DATA:  Mild chest tightness, shortness of breath and fatigue, atrial fibrillation, CHF, diabetes mellitus, coronary artery disease post MI, hypertension EXAM: CHEST - 2 VIEW COMPARISON:   01/04/2019, 12/28/2017 FINDINGS: Upper normal heart size. Mediastinal contours and pulmonary vascularity normal. Atherosclerotic calcification aorta. Lungs clear. Chronic blunting of the LEFT lateral costophrenic angle by either pleural fluid or thickening. On lateral view, chronic pleural based opacity is posteriorly, likely at the posterior lower LEFT lung, question loculated pleural effusion; this is unchanged since 12/28/2017. Bones unremarkable. IMPRESSION: Chronic LEFT pleural effusion versus pleural thickening at the LEFT base, and likely loculated at the posterior lower LEFT chest, unchanged since 12/28/2017. No definite acute abnormalities. Electronically Signed   By: Lavonia Dana M.D.   On: 01/31/2019 11:53    Assessment and Plan:    ATRIAL FIB:  He had a self limited episode.  At this point I am  not considering a change in therapy.  He is not able to take anticoagulation because of recurrent iron deficiency anemia.  For now continue current therapy.   AS:  This was moderate on echo in June.  No change in therapy.   CAD:  He had a low risk perfusion study in December 2018.  He does have a mild trop elevation and I will trend these.  However, if there is no clear significant increase upward no further imaging will be indicated. I suspect this is elevated secondary to demand ischemia.     Chronic diastolic HF:    He seems to be euvolemic.  No change in therapy.   HTN:    The blood pressure is at target. No change in medications is indicated. We will continue with therapeutic lifestyle changes (TLC).  HLD:    Continue current statin.     on statin, continue   DM:  Per [name], Evie Lacks, MD. A1C was 6.2.  No change in therapy. .   CKD:  Baseline 2.0.   He is above baseline but I will continue the current therapy and follow creatinine in the AM.    RT SUBCLAVIAN STENOSIS:   Per Dr. Fletcher Anon.      For questions or updates, please contact Anthony Please consult  [email or web address] for contact info under Cardiology/STEMI.   Signed, Minus Breeding, MD  01/31/2019 4:10 PM

## 2019-01-31 NOTE — Assessment & Plan Note (Signed)
Stable after the injection.  No significant other interventions at this time.  Patient was sent to the emergency room secondary to the potential new onset of atrial fibrillation again

## 2019-01-31 NOTE — ED Triage Notes (Signed)
Pt reports mild chest tightness, sob and fatigue. Sent here by pcp for atrial fib, pt reports hx of same.

## 2019-01-31 NOTE — Progress Notes (Signed)
Pt. With critical Troponin of 272. On call for Cardiology paged to make aware.

## 2019-02-01 ENCOUNTER — Encounter (HOSPITAL_COMMUNITY): Payer: Self-pay | Admitting: General Practice

## 2019-02-01 DIAGNOSIS — E669 Obesity, unspecified: Secondary | ICD-10-CM | POA: Diagnosis not present

## 2019-02-01 DIAGNOSIS — R7989 Other specified abnormal findings of blood chemistry: Secondary | ICD-10-CM

## 2019-02-01 DIAGNOSIS — E785 Hyperlipidemia, unspecified: Secondary | ICD-10-CM | POA: Diagnosis not present

## 2019-02-01 DIAGNOSIS — N183 Chronic kidney disease, stage 3 (moderate): Secondary | ICD-10-CM | POA: Diagnosis not present

## 2019-02-01 DIAGNOSIS — I700 Atherosclerosis of aorta: Secondary | ICD-10-CM | POA: Diagnosis not present

## 2019-02-01 DIAGNOSIS — I2510 Atherosclerotic heart disease of native coronary artery without angina pectoris: Secondary | ICD-10-CM | POA: Diagnosis not present

## 2019-02-01 DIAGNOSIS — Z1159 Encounter for screening for other viral diseases: Secondary | ICD-10-CM | POA: Diagnosis not present

## 2019-02-01 DIAGNOSIS — E1151 Type 2 diabetes mellitus with diabetic peripheral angiopathy without gangrene: Secondary | ICD-10-CM | POA: Diagnosis not present

## 2019-02-01 DIAGNOSIS — I4819 Other persistent atrial fibrillation: Secondary | ICD-10-CM | POA: Diagnosis not present

## 2019-02-01 DIAGNOSIS — E1122 Type 2 diabetes mellitus with diabetic chronic kidney disease: Secondary | ICD-10-CM | POA: Diagnosis not present

## 2019-02-01 DIAGNOSIS — I4891 Unspecified atrial fibrillation: Secondary | ICD-10-CM | POA: Diagnosis not present

## 2019-02-01 DIAGNOSIS — I272 Pulmonary hypertension, unspecified: Secondary | ICD-10-CM | POA: Diagnosis not present

## 2019-02-01 DIAGNOSIS — I5033 Acute on chronic diastolic (congestive) heart failure: Secondary | ICD-10-CM | POA: Diagnosis not present

## 2019-02-01 DIAGNOSIS — I130 Hypertensive heart and chronic kidney disease with heart failure and stage 1 through stage 4 chronic kidney disease, or unspecified chronic kidney disease: Secondary | ICD-10-CM | POA: Diagnosis not present

## 2019-02-01 LAB — LIPID PANEL
Cholesterol: 121 mg/dL (ref 0–200)
HDL: 23 mg/dL — ABNORMAL LOW (ref >40)
LDL Cholesterol: UNDETERMINED mg/dL (ref 0–99)
Total CHOL/HDL Ratio: 5.3 RATIO
Triglycerides: 503 mg/dL — ABNORMAL HIGH (ref <150)
VLDL: UNDETERMINED mg/dL (ref 0–40)

## 2019-02-01 LAB — CBC
HCT: 30.7 % — ABNORMAL LOW (ref 39.0–52.0)
Hemoglobin: 10.3 g/dL — ABNORMAL LOW (ref 13.0–17.0)
MCH: 30.6 pg (ref 26.0–34.0)
MCHC: 33.6 g/dL (ref 30.0–36.0)
MCV: 91.1 fL (ref 80.0–100.0)
Platelets: 132 10*3/uL — ABNORMAL LOW (ref 150–400)
RBC: 3.37 MIL/uL — ABNORMAL LOW (ref 4.22–5.81)
RDW: 16.2 % — ABNORMAL HIGH (ref 11.5–15.5)
WBC: 3.4 10*3/uL — ABNORMAL LOW (ref 4.0–10.5)
nRBC: 0 % (ref 0.0–0.2)

## 2019-02-01 LAB — BASIC METABOLIC PANEL
Anion gap: 10 (ref 5–15)
BUN: 37 mg/dL — ABNORMAL HIGH (ref 8–23)
CO2: 23 mmol/L (ref 22–32)
Calcium: 8.5 mg/dL — ABNORMAL LOW (ref 8.9–10.3)
Chloride: 105 mmol/L (ref 98–111)
Creatinine, Ser: 2.45 mg/dL — ABNORMAL HIGH (ref 0.61–1.24)
GFR calc Af Amer: 29 mL/min — ABNORMAL LOW (ref >60)
GFR calc non Af Amer: 25 mL/min — ABNORMAL LOW (ref >60)
Glucose, Bld: 151 mg/dL — ABNORMAL HIGH (ref 70–99)
Potassium: 4 mmol/L (ref 3.5–5.1)
Sodium: 138 mmol/L (ref 135–145)

## 2019-02-01 LAB — GLUCOSE, CAPILLARY
Glucose-Capillary: 167 mg/dL — ABNORMAL HIGH (ref 70–99)
Glucose-Capillary: 184 mg/dL — ABNORMAL HIGH (ref 70–99)

## 2019-02-01 LAB — LDL CHOLESTEROL, DIRECT: Direct LDL: 27.5 mg/dL (ref 0–99)

## 2019-02-01 MED ORDER — AMIODARONE HCL 200 MG PO TABS: 200.0000 mg | ORAL_TABLET | Freq: Every day | ORAL | Status: DC

## 2019-02-01 MED ORDER — METOPROLOL TARTRATE 25 MG PO TABS: 12.5000 mg | ORAL_TABLET | Freq: Two times a day (BID) | ORAL | 6 refills | Status: AC

## 2019-02-01 MED ORDER — FUROSEMIDE 40 MG PO TABS: 40.0000 mg | ORAL_TABLET | Freq: Two times a day (BID) | ORAL | Status: DC

## 2019-02-01 NOTE — Discharge Instructions (Signed)
Heart healthy diabetic diet   Weigh daily if wt climbs more than 2 lbs in 1-2 days may take an extra 20 mg of lasix, we decreased your daily dose.  Also your amiodarone is 200 mg daily.    You were in a fib on admit and converted to SR.

## 2019-02-01 NOTE — Plan of Care (Signed)
Pt has no further cp, sr on telemetry

## 2019-02-01 NOTE — Consult Note (Signed)
   [facility] CM Inpatient Consult   02/01/2019  Michael [name] 1940/11/04 [id number]  Patient screened for high risk score [24%] for unplanned readmission score  and/or for hospitalizations to check if potential [place] [facility] services are needed.  Review of patient's medical record reveals patient is per Dr Hochrein's Discharge Summary is:  78 y.o.malewith a hx of atrialfib, HTN, obesity, CAD, PAF, and chronic diastolic HF was admitted 10/26/37 with atrial fib with mild chest tightness, SOB and fatigue.   Also with hx of GI bleeds due to AVMs.    Primary Care Provider is  [name], MD.  This office provides the transition of care follow up.   Pharmacy is:  [facility].  Patient states medications are no problem Transportation to provider: wife  Plan:  Spoke with the patient via telephone, HIPAA verified by 2 identifiers.  Patient denies any needs but wouldn't mind follow up with General EMMI calls, in which this writer will assign.    For questions contact:   Natividad Brood, RN BSN [facility] Liaison  [phone] business mobile phone Toll free office [phone]  Fax number: [phone] [email or web address]

## 2019-02-01 NOTE — Discharge Summary (Addendum)
Discharge Summary    Patient ID: Michael [name] MRN: [id number]; DOB: [birth year]  Admit date: 01/31/2019 Discharge date: 02/01/2019  Primary Care Provider: Cassandria Anger, MD  Primary Cardiologist: Jenkins Rouge, MD  Primary Electrophysiologist:  Dr. Lovena Le    Discharge Diagnoses    Principal Problem:   Persistent atrial fibrillation Active Problems:   DM (diabetes mellitus), type 2 with peripheral vascular complications (HCC)   Hyperlipidemia   Chronic diastolic CHF (congestive heart failure) ([facility])   Coronary artery disease involving native coronary artery of native heart without angina pectoris   CKD (chronic kidney disease), stage III (HCC)   Chest pain  Allergies Allergies  Allergen Reactions  . Percocet [Oxycodone-Acetaminophen] Nausea And Vomiting  . Ciprofloxacin Other (See Comments)    Light headed and made stagger  . Metformin And Related Nausea Only    Upset stomach  . Invokana [Canagliflozin] Rash    Diagnostic Studies/Procedures    None this admit  _____________   History of Present Illness     78 y.o. male with a hx of atrial fib, HTN, obesity, CAD, PAF, and chronic diastolic HF was admitted 03/02/85 with atrial fib with mild chest tightness, SOB and fatigue.  Last cath 2018 with DES to OM1, otherwise with nonobstructive disease.  Also with hx of GI bleeds due to AVMs.    Presented to ER 01/31/19 from PCP for atrial fib.  He noticed that while he has PAF on day of admit it did not go away.  He felt he was running a race.  No chest pain.  In ER his HS troponin was 158; 277; and 272 thought to be due to A fib.   Mg+ 2.4, Cr 2.56 Na 140 and hgb 11.7    CXR Chronic LEFT pleural effusion versus pleural thickening at the LEFT base, and likely loculated at the posterior lower LEFT chest, unchanged since 12/28/2017. TSH was normal.  Pt converted in ER to sR, admitted to observe.    Not on anticoagulation due to recurrent iron def. Anemia.   Hospital Course      Consultants:none  Today he remains in sR and was seen and evaluated by Dr. Percival Spanish and found stable for discharge.  He was euvolemic at discharge with his diastolic HF, no angina with his CAD.  His elevated troponin was due to a fib.   BP was well controlled no changes.  Lasix decreased to 40 mg BID though if wt increases he can take an extra 20 mg.  His DM is followed by PCP.  No change in Lipid management. He does have AS and in June was moderate.    He was seen and evaluated by Dr. Percival Spanish and found stable for discharge.    _____________  Discharge Vitals Blood pressure (!) 157/67, pulse 63, temperature 97.9 F (36.6 C), temperature source Oral, resp. rate 17, height 5' 11"  (1.803 m), weight 114.9 kg, SpO2 100 %.  Filed Weights   01/31/19 1833 02/01/19 0249  Weight: 115 kg 114.9 kg    Labs & Radiologic Studies    CBC Recent Labs    01/31/19 1126 02/01/19 0449  WBC 4.3 3.4*  HGB 11.7* 10.3*  HCT 34.2* 30.7*  MCV 90.7 91.1  PLT 176 767*   Basic Metabolic Panel Recent Labs    01/31/19 1126 01/31/19 1212 02/01/19 0449  NA 140  --  138  K 4.1  --  4.0  CL 105  --  105  CO2 24  --  23  GLUCOSE 192*  --  151*  BUN 37*  --  37*  CREATININE 2.56*  --  2.45*  CALCIUM 9.1  --  8.5*  MG  --  2.4  --   PHOS  --  4.6  --    Liver Function Tests No results for input(s): AST, ALT, ALKPHOS, BILITOT, PROT, ALBUMIN in the last 72 hours. No results for input(s): LIPASE, AMYLASE in the last 72 hours. Cardiac Enzymes No results for input(s): CKTOTAL, CKMB, CKMBINDEX, TROPONINI in the last 72 hours. BNP Invalid input(s): POCBNP D-Dimer No results for input(s): DDIMER in the last 72 hours. Hemoglobin A1C No results for input(s): HGBA1C in the last 72 hours. Fasting Lipid Panel Recent Labs    02/01/19 0449  CHOL 121  HDL 23*  LDLCALC UNABLE TO CALCULATE IF TRIGLYCERIDE OVER 400 mg/dL  TRIG 503*  CHOLHDL 5.3  LDLDIRECT 27.5   Thyroid Function Tests Recent Labs     01/31/19 1215  TSH 2.863   _____________  Dg Chest 2 View  Result Date: 01/31/2019 CLINICAL DATA:  Mild chest tightness, shortness of breath and fatigue, atrial fibrillation, CHF, diabetes mellitus, coronary artery disease post MI, hypertension EXAM: CHEST - 2 VIEW COMPARISON:  01/04/2019, 12/28/2017 FINDINGS: Upper normal heart size. Mediastinal contours and pulmonary vascularity normal. Atherosclerotic calcification aorta. Lungs clear. Chronic blunting of the LEFT lateral costophrenic angle by either pleural fluid or thickening. On lateral view, chronic pleural based opacity is posteriorly, likely at the posterior lower LEFT lung, question loculated pleural effusion; this is unchanged since 12/28/2017. Bones unremarkable. IMPRESSION: Chronic LEFT pleural effusion versus pleural thickening at the LEFT base, and likely loculated at the posterior lower LEFT chest, unchanged since 12/28/2017. No definite acute abnormalities. Electronically Signed   By: Lavonia Dana M.D.   On: 01/31/2019 11:53   Dg Chest Portable 1 View  Result Date: 01/04/2019 CLINICAL DATA:  Shortness of breath EXAM: PORTABLE CHEST 1 VIEW COMPARISON:  12/28/2017 FINDINGS: Cardiac size is enlarged. There is generalized volume overload. There may be a retrocardiac/left basilar airspace opacity. No pneumothorax. No large pleural effusion. No acute osseous abnormality. There are dense aortic calcifications. The lung volumes are low. IMPRESSION: 1. Mild cardiac enlargement with mild volume overload. 2. Low lung volumes. 3. Possible retrocardiac/left basilar airspace opacity which may represent atelectasis or developing infiltrate. Electronically Signed   By: Constance Holster M.D.   On: 01/04/2019 02:18   Dg Foot Complete Right  Result Date: 01/03/2019 Please see detailed radiograph report in office note.  Disposition   Pt is being discharged home today in good condition.  Follow-up Plans & Appointments    Follow-up Information     Josue Hector, MD Follow up on 02/15/2019.   Specialty: Cardiology Why: at 54 AM with his nurse practitioner Truitt Merle  Contact information: [address] [place] [address] [phone]           Heart healthy diabetic diet   Weigh daily if wt climbs more than 2 lbs in 1-2 days may take an extra 20 mg of lasix, we decreased your daily dose.  Also your amiodarone is 200 mg daily.    You were in a fib on admit and converted to SR.     Discharge Medications   Allergies as of 02/01/2019      Reactions   Percocet [oxycodone-acetaminophen] Nausea And Vomiting   Ciprofloxacin Other (See Comments)   Light headed and made stagger   Metformin  And Related Nausea Only   Upset stomach   Invokana [canagliflozin] Rash      Medication List    TAKE these medications   allopurinol 100 MG tablet Commonly known as: ZYLOPRIM Take 2 tablets (283m total) by mouth daily   amiodarone 200 MG tablet Commonly known as: PACERONE Take 1 tablet (200 mg total) by mouth daily. Start taking on: February 02, 2019 What changed:   how much to take  how to take this  when to take this  additional instructions   aspirin 81 MG chewable tablet Chew 1 tablet (81 mg total) by mouth daily.   atorvastatin 40 MG tablet Commonly known as: LIPITOR Take 1 tablet by mouth daily   b complex vitamins tablet Take 1 tablet by mouth daily.   Blood Pressure Monitor Kit Use to check blood pressure daily Dx I10   collagenase ointment Commonly known as: Santyl Apply 1 application topically daily.   diltiazem 180 MG 24 hr capsule Commonly known as: Cartia XT Take 1 capsule (180 mg total) by mouth daily.   fenofibrate 160 MG tablet Take 1 tablet by mouth daily (Please schedule appointment for future refills) What changed: See the new instructions.   ferrous sulfate 325 (65 FE) MG tablet Take 325 mg by mouth 3 (three) times daily with meals.   furosemide 40 MG tablet Commonly  known as: LASIX Take 1 tablet (40 mg total) by mouth 2 (two) times daily. What changed:   medication strength  how much to take  additional instructions   gabapentin 100 MG capsule Commonly known as: NEURONTIN Take 1 capsule by mouth twice a day   glipiZIDE 10 MG tablet Commonly known as: GLUCOTROL Take 5 mg by mouth daily before breakfast.   glucose blood test strip Commonly known as: OneTouch Ultra CHECK BLOOD GLUCOSE (SUGAR) 4 TIMES DAILY DX: E11.9, Z79.4 (pt on insulin)   hydrALAZINE 50 MG tablet Commonly known as: APRESOLINE Take 1 tablet (50 mg total) by mouth 3 (three) times daily.   HYDROcodone-acetaminophen 5-325 MG tablet Commonly known as: NORCO/VICODIN Take 1 tablet by mouth 2 (two) times daily as needed for moderate pain.   insulin aspart protamine - aspart (70-30) 100 UNIT/ML FlexPen Commonly known as: NOVOLOG 70/30 MIX Inject 0.5 mLs (50 Units total) into the skin 2 (two) times daily with a meal.   Insulin Pen Needle 32G X 4 MM Misc Use to administer insulin   levothyroxine 150 MCG tablet Commonly known as: SYNTHROID 1 po q am What changed:   how much to take  how to take this  when to take this  additional instructions   metoprolol tartrate 25 MG tablet Commonly known as: LOPRESSOR Take 0.5 tablets (12.5 mg total) by mouth 2 (two) times daily.   NEEDLE (DISP) 30 G 30G X 1/2" Misc Commonly known as: BD Disp Needles USE WITH 1 SYRINGE SUBCUTANEOUSLY  TWICE DAILY   nitroGLYCERIN 0.4 MG SL tablet Commonly known as: NITROSTAT Place 1 tablet under tongue for chest pain. Repeat every 5 minutes as needed, up to 3 doses. If no relief after 3 doses, call 911. What changed: See the new instructions.   omeprazole 40 MG capsule Commonly known as: PRILOSEC Take 1 capsule by mouth daily   ONE TOUCH ULTRA 2 w/Device Kit Use as directed Dx E11.9   onetouch ultrasoft lancets Use to check blood sugars daily   potassium chloride SA 20 MEQ tablet  Commonly known as: K-DUR Take 1 tablet by mouth  daily   Vitamin D-3 25 MCG (1000 UT) Caps Take 1,000-5,000 Units by mouth See admin instructions. 1,000 units once a day on Sun/Mon/Tues/Wed/Thurs/Fri and 5,000 units on Sat        Acute coronary syndrome (MI, NSTEMI, STEMI, etc) this admission?: No.    Outstanding Labs/Studies   BMP   Duration of Discharge Encounter   Greater than 30 minutes including physician time.  Signed, Cecilie Kicks, NP 02/01/2019, 2:25 PM   Patient seen and examined.  Plan as discussed in my rounding note for today and outlined above. Minus Breeding  02/01/2019  3:55 PM

## 2019-02-01 NOTE — Care Management CC44 (Signed)
Condition Code 44 Documentation Completed  Patient Details  Name: Michael [name] MRN: [id number] Date of Birth: 1940/11/30   Condition Code 44 given:  Yes Patient signature on Condition Code 44 notice:  Yes Documentation of 2 MD's agreement:  Yes Code 44 added to claim:  Yes    Sharin Mons, RN 02/01/2019, 3:16 PM

## 2019-02-01 NOTE — Progress Notes (Addendum)
Progress Note  Patient Name: Michael [name] Date of Encounter: 02/01/2019  Primary Cardiologist:   No primary care provider on file.   Subjective   No chest pain.  No SOB.  No arrhythmias overnight  Inpatient Medications    Scheduled Meds: . allopurinol  200 mg Oral Daily  . amiodarone  200 mg Oral Daily  . aspirin EC  81 mg Oral Daily  . atorvastatin  40 mg Oral Daily  . diltiazem  180 mg Oral Daily  . fenofibrate  160 mg Oral Daily  . ferrous sulfate  325 mg Oral TID WC  . furosemide  60 mg Oral BID  . gabapentin  100 mg Oral BID  . glipiZIDE  5 mg Oral QAC breakfast  . hydrALAZINE  50 mg Oral TID  . insulin aspart  0-15 Units Subcutaneous TID WC  . insulin aspart protamine- aspart  50 Units Subcutaneous BID WC  . levothyroxine  150 mcg Oral QAC breakfast  . metoprolol tartrate  12.5 mg Oral BID  . pantoprazole  40 mg Oral Daily  . potassium chloride SA  20 mEq Oral Daily  . sodium chloride flush  3 mL Intravenous Once   Continuous Infusions:  PRN Meds: acetaminophen, nitroGLYCERIN, ondansetron (ZOFRAN) IV   Vital Signs    Vitals:   01/31/19 2333 02/01/19 0249 02/01/19 0422 02/01/19 0829  BP: (!) 141/69  (!) 141/73 (!) 173/75  Pulse: 62  66 70  Resp:   18 16  Temp: 98.2 F (36.8 C)  97.8 F (36.6 C) 97.6 F (36.4 C)  TempSrc: Oral  Oral Oral  SpO2: 99%  99% 99%  Weight:  114.9 kg    Height:        Intake/Output Summary (Last 24 hours) at 02/01/2019 0915 Last data filed at 02/01/2019 0836 Gross per 24 hour  Intake 240 ml  Output 225 ml  Net 15 ml   Filed Weights   01/31/19 1833 02/01/19 0249  Weight: 115 kg 114.9 kg    Telemetry    NSR - Personally Reviewed  ECG    NA - Personally Reviewed  Physical Exam   GEN: No acute distress.   Neck: No  JVD Cardiac: RRR, no murmurs, rubs, or gallops.  Respiratory: Clear  to auscultation bilaterally. GI: Soft, nontender, non-distended  MS:   Trace edema; No deformity. Neuro:  Nonfocal  Psych:  Normal affect   Labs    Chemistry Recent Labs  Lab 01/31/19 1126 02/01/19 0449  NA 140 138  K 4.1 4.0  CL 105 105  CO2 24 23  GLUCOSE 192* 151*  BUN 37* 37*  CREATININE 2.56* 2.45*  CALCIUM 9.1 8.5*  GFRNONAA 23* 25*  GFRAA 27* 29*  ANIONGAP 11 10     Hematology Recent Labs  Lab 01/31/19 1126 02/01/19 0449  WBC 4.3 3.4*  RBC 3.77* 3.37*  HGB 11.7* 10.3*  HCT 34.2* 30.7*  MCV 90.7 91.1  MCH 31.0 30.6  MCHC 34.2 33.6  RDW 16.1* 16.2*  PLT 176 132*    Cardiac EnzymesNo results for input(s): TROPONINI in the last 168 hours. No results for input(s): TROPIPOC in the last 168 hours.   BNPNo results for input(s): BNP, PROBNP in the last 168 hours.   DDimer No results for input(s): DDIMER in the last 168 hours.   Radiology    Dg Chest 2 View  Result Date: 01/31/2019 CLINICAL DATA:  Mild chest tightness, shortness of breath and fatigue, atrial fibrillation,  CHF, diabetes mellitus, coronary artery disease post MI, hypertension EXAM: CHEST - 2 VIEW COMPARISON:  01/04/2019, 12/28/2017 FINDINGS: Upper normal heart size. Mediastinal contours and pulmonary vascularity normal. Atherosclerotic calcification aorta. Lungs clear. Chronic blunting of the LEFT lateral costophrenic angle by either pleural fluid or thickening. On lateral view, chronic pleural based opacity is posteriorly, likely at the posterior lower LEFT lung, question loculated pleural effusion; this is unchanged since 12/28/2017. Bones unremarkable. IMPRESSION: Chronic LEFT pleural effusion versus pleural thickening at the LEFT base, and likely loculated at the posterior lower LEFT chest, unchanged since 12/28/2017. No definite acute abnormalities. Electronically Signed   By: Lavonia Dana M.D.   On: 01/31/2019 11:53    Cardiac Studies   NA  Patient Profile     78 y.o. male with a hx of atrial who is being seen for the evaluation of atrial fib at the request of Dr. Johnney Killian  Assessment & Plan    ATRIAL FIB:   No  further events.  OK to go home.  No change in meds.   AS:  This was moderate on echo in June.  No change in therapy.   CAD:  He had a low risk perfusion study in December 2018.  Trop trend has not been upward and he has had no further symptoms.  I think this was a demand ischemia issue.    Chronic diastolic HF:    He seems to be euvolemic.  See below.   HTN:    The blood pressure is at target.  No change in therapy.   HLD:     Continue current statin.     DM:  Per [name], Evie Lacks, MD. A1C was 6.2.  No change in therapy.   Much improved from previous.    CKD III:  Creat is stable.  Two weeks ago it was elevated at 2.39.  I will reduce his Lasix back to 40 mg bid at discharge.  Can take extra 20 mg with 2 lb weight gain.      For questions or updates, please contact [facility] Please consult [email or web address] for contact info under Cardiology/STEMI.   Signed, Minus Breeding, MD  02/01/2019, 9:15 AM

## 2019-02-01 NOTE — Care Management Obs Status (Signed)
[facility] NOTIFICATION   Patient Details  Name: TYMERE DEPUY MRN: [id number] Date of Birth: 05-02-1941   Medicare Observation Status Notification Given:  Yes    Sharin Mons, RN 02/01/2019, 3:16 PM

## 2019-02-02 ENCOUNTER — Telehealth: Payer: Self-pay | Admitting: *Deleted

## 2019-02-02 NOTE — Telephone Encounter (Signed)
Per chart hosp f/u appt scheduled for 02/09/19 w/Dr. Alain Marion. Tried calling pt to conform appt and complete TCM questionnaire. There was no answer LMOM RTC...Michael [name]

## 2019-02-02 NOTE — Telephone Encounter (Signed)
Pt was on TCM report he was admitted 01/31/19 with atrial fib with mild chest tightness, SOB and fatigue. Pt was treated and d/c 02/02/19. Pt will follow=yp w/cardiologist Dr. Jenkins Rouge 02/15/19.Marland KitchenJohny Chess

## 2019-02-03 NOTE — Telephone Encounter (Signed)
Called pt to verify hosp f/u appt and completed TCM call spoke w/wife she states they are aware of appt w/MD. She  called and made appt completed TCM questionnaire below.Johny Chess  Transition Care Management Follow-up Telephone Call   Date discharged? 02/01/19   How have you been since you were released from the hospital? Wife states he is doing fine. He haven't had any chest tightness or anything since been home   Do you understand why you were in the hospital? YES   Do you understand the discharge instructions? YES   Where were you discharged to? Home   Items Reviewed:  Medications reviewed: YES  Allergies reviewed: YES  Dietary changes reviewed: YES, heart healthy  Referrals reviewed: YES, will see cardiology 02/15/19   Functional Questionnaire:   Activities of Daily Living (ADLs):   She states he are independent in the following: ambulation, bathing and hygiene, feeding, continence, grooming, toileting and dressing States he doesn't require assistance    Any transportation issues/concerns?: NO   Any patient concerns? NO   Confirmed importance and date/time of follow-up visits scheduled YES, appt 02/09/19  Provider Appointment booked with Dr. Anurag Redo  Confirmed with patient if condition begins to worsen call PCP or go to the ER.  Patient was given the office number and encouraged to call back with question or concerns.  : YES

## 2019-02-09 ENCOUNTER — Inpatient Hospital Stay: Payer: Medicare Other | Admitting: Internal Medicine

## 2019-02-14 ENCOUNTER — Other Ambulatory Visit: Payer: Self-pay

## 2019-02-14 ENCOUNTER — Ambulatory Visit (INDEPENDENT_AMBULATORY_CARE_PROVIDER_SITE_OTHER): Payer: Medicare Other | Admitting: Internal Medicine

## 2019-02-14 ENCOUNTER — Telehealth: Payer: Self-pay | Admitting: Nurse Practitioner

## 2019-02-14 ENCOUNTER — Encounter: Payer: Self-pay | Admitting: Internal Medicine

## 2019-02-14 DIAGNOSIS — E1151 Type 2 diabetes mellitus with diabetic peripheral angiopathy without gangrene: Secondary | ICD-10-CM | POA: Diagnosis not present

## 2019-02-14 DIAGNOSIS — I10 Essential (primary) hypertension: Secondary | ICD-10-CM

## 2019-02-14 DIAGNOSIS — I480 Paroxysmal atrial fibrillation: Secondary | ICD-10-CM

## 2019-02-14 DIAGNOSIS — I5032 Chronic diastolic (congestive) heart failure: Secondary | ICD-10-CM | POA: Diagnosis not present

## 2019-02-14 DIAGNOSIS — L4050 Arthropathic psoriasis, unspecified: Secondary | ICD-10-CM

## 2019-02-14 MED ORDER — HYDROCODONE-ACETAMINOPHEN 5-325 MG PO TABS: 1.0000 | ORAL_TABLET | Freq: Four times a day (QID) | ORAL | 0 refills | Status: DC | PRN

## 2019-02-14 MED ORDER — LEVOTHYROXINE SODIUM 150 MCG PO TABS: 150.0000 ug | ORAL_TABLET | Freq: Every day | ORAL | 3 refills | Status: DC

## 2019-02-14 NOTE — Assessment & Plan Note (Signed)
Diltiazem d/c, Furosemide, Hydralazine , Amiodarone, Metoprolol

## 2019-02-14 NOTE — Assessment & Plan Note (Signed)
BP Readings from Last 3 Encounters:  02/14/19 (!) 150/72  02/01/19 (!) 157/67  01/31/19 104/60

## 2019-02-14 NOTE — Progress Notes (Signed)
Subjective:  Patient ID: Michael [name], male    DOB: Oct 31, 1940  Age: 78 y.o. MRN: [id number]  CC: No chief complaint on file.   HPI Michael [name] presents for A fib recent hosp stay for A fib - Metoprolol was increased by Cardiology. MI was ruled out... C/o lightheadedness w/standing; no LOC F/u HTN, CHF, DM  Admit date: 01/31/2019 Discharge date: 02/01/2019  Primary Care Provider: Cassandria Anger, MD  Primary Cardiologist: Jenkins Rouge, MD  Primary Electrophysiologist:  Dr. Lovena Le    Discharge Diagnoses    Principal Problem:   Persistent atrial fibrillation Active Problems:   DM (diabetes mellitus), type 2 with peripheral vascular complications (HCC)   Hyperlipidemia   Chronic diastolic CHF (congestive heart failure) ([place])   Coronary artery disease involving native coronary artery of native heart without angina pectoris   CKD (chronic kidney disease), stage III (HCC)   Chest pain  Allergies      Allergies  Allergen Reactions   Percocet [Oxycodone-Acetaminophen] Nausea And Vomiting   Ciprofloxacin Other (See Comments)    Light headed and made stagger   Metformin And Related Nausea Only    Upset stomach   Invokana [Canagliflozin] Rash    Diagnostic Studies/Procedures    None this admit  _____________   History of Present Illness     78 y.o.malewith a hx of atrialfib, HTN, obesity, CAD, PAF, and chronic diastolic HF was admitted 12/26/24 with atrial fib with mild chest tightness, SOB and fatigue.  Last cath 2018 with DES to OM1, otherwise with nonobstructive disease.  Also with hx of GI bleeds due to AVMs.    Presented to ER 01/31/19 from PCP for atrial fib.  He noticed that while he has PAF on day of admit it did not go away.  He felt he was running a race.  No chest pain.  In ER his HS troponin was 158; 277; and 272 thought to be due to A fib.   Mg+ 2.4, Cr 2.56 Na 140 and hgb 11.7    CXR Chronic LEFT pleural effusion versus pleural thickening at the  LEFT base, and likely loculated at the posterior lower LEFT chest, unchanged since 12/28/2017. TSH was normal.  Pt converted in ER to sR, admitted to observe.    Not on anticoagulation due to recurrent iron def. Anemia.   Hospital Course     Consultants:none  Today he remains in sR and was seen and evaluated by Dr. Percival Spanish and found stable for discharge.  He was euvolemic at discharge with his diastolic HF, no angina with his CAD.  His elevated troponin was due to a fib.   BP was well controlled no changes.  Lasix decreased to 40 mg BID though if wt increases he can take an extra 20 mg.  His DM is followed by PCP.  No change in Lipid management. He does have AS and in June was moderate.    He was seen and evaluated by Dr. Percival Spanish and found stable for discharge.    _____________  Discharge Vitals Blood pressure (!) 157/67, pulse 63, temperature 97.9 F (36.6 C), temperature source Oral, resp. rate 17, height _0  (1.803 m), weight 114.9 kg, SpO2 100 %.      Filed Weights   01/31/19 1833 02/01/19 0249  Weight: 115 kg 114.9 kg     Outpatient Medications Prior to Visit  Medication Sig Dispense Refill   allopurinol (ZYLOPRIM) 100 MG tablet Take 2 tablets (265m total) by mouth  daily 60 tablet 5   amiodarone (PACERONE) 200 MG tablet Take 1 tablet (200 mg total) by mouth daily.     aspirin 81 MG chewable tablet Chew 1 tablet (81 mg total) by mouth daily. 90 tablet 1   atorvastatin (LIPITOR) 40 MG tablet Take 1 tablet by mouth daily 90 tablet 3   b complex vitamins tablet Take 1 tablet by mouth daily. 100 tablet 3   Blood Glucose Monitoring Suppl (ONE TOUCH ULTRA 2) W/DEVICE KIT Use as directed Dx E11.9 1 each 0   Blood Pressure Monitor KIT Use to check blood pressure daily Dx I10 1 each 0   Cholecalciferol (VITAMIN D-3) 1000 units CAPS Take 1,000-5,000 Units by mouth See admin instructions. 1,000 units once a day on Sun/Mon/Tues/Wed/Thurs/Fri and 5,000 units on Sat      collagenase (SANTYL) ointment Apply 1 application topically daily. 15 g 2   diltiazem (CARTIA XT) 180 MG 24 hr capsule Take 1 capsule (180 mg total) by mouth daily. 90 capsule 1   fenofibrate 160 MG tablet Take 1 tablet by mouth daily (Please schedule appointment for future refills) (Patient taking differently: Take 160 mg by mouth daily. ) 90 tablet 1   ferrous sulfate 325 (65 FE) MG tablet Take 325 mg by mouth 3 (three) times daily with meals.     furosemide (LASIX) 40 MG tablet Take 1 tablet (40 mg total) by mouth 2 (two) times daily. 30 tablet    gabapentin (NEURONTIN) 100 MG capsule Take 1 capsule by mouth twice a day (Patient taking differently: Take 100 mg by mouth 2 (two) times daily. ) 180 capsule 3   glipiZIDE (GLUCOTROL) 10 MG tablet Take 5 mg by mouth daily before breakfast.     glucose blood (ONETOUCH ULTRA) test strip CHECK BLOOD GLUCOSE (SUGAR) 4 TIMES DAILY DX: E11.9, Z79.4 (pt on insulin) 400 each 3   hydrALAZINE (APRESOLINE) 50 MG tablet Take 1 tablet (50 mg total) by mouth 3 (three) times daily. 90 tablet 0   HYDROcodone-acetaminophen (NORCO/VICODIN) 5-325 MG tablet Take 1 tablet by mouth 2 (two) times daily as needed for moderate pain. 60 tablet 0   insulin aspart protamine - aspart (NOVOLOG 70/30 MIX) (70-30) 100 UNIT/ML FlexPen Inject 0.5 mLs (50 Units total) into the skin 2 (two) times daily with a meal. 30 mL 11   Insulin Pen Needle 32G X 4 MM MISC Use to administer insulin 100 each 11   Lancets (ONETOUCH ULTRASOFT) lancets Use to check blood sugars daily 30 each 11   levothyroxine (SYNTHROID, LEVOTHROID) 150 MCG tablet 1 po q am (Patient taking differently: Take 150 mcg by mouth daily before breakfast. ) 90 tablet 3   metoprolol tartrate (LOPRESSOR) 25 MG tablet Take 0.5 tablets (12.5 mg total) by mouth 2 (two) times daily. 30 tablet 6   NEEDLE, DISP, 30 G (BD DISP NEEDLES) 30G X 1/2" MISC USE WITH 1 SYRINGE SUBCUTANEOUSLY  TWICE DAILY 100 each 11    nitroGLYCERIN (NITROSTAT) 0.4 MG SL tablet Place 1 tablet under tongue for chest pain. Repeat every 5 minutes as needed, up to 3 doses. If no relief after 3 doses, call 911. (Patient taking differently: Place 0.4 mg under the tongue every 5 (five) minutes as needed for chest pain. ) 25 tablet 11   omeprazole (PRILOSEC) 40 MG capsule Take 1 capsule by mouth daily (Patient taking differently: Take 40 mg by mouth daily. ) 90 capsule 0   potassium chloride SA (K-DUR,KLOR-CON) 20 MEQ tablet Take  1 tablet by mouth daily (Patient taking differently: Take 20 mEq by mouth daily. ) 90 tablet 0   No facility-administered medications prior to visit.     ROS: Review of Systems  Constitutional: Positive for fatigue and unexpected weight change. Negative for appetite change.  HENT: Negative for congestion, nosebleeds, sneezing, sore throat and trouble swallowing.   Eyes: Negative for itching and visual disturbance.  Respiratory: Negative for cough.   Cardiovascular: Negative for chest pain, palpitations and leg swelling.  Gastrointestinal: Negative for abdominal distention, blood in stool, diarrhea and nausea.  Genitourinary: Negative for frequency and hematuria.  Musculoskeletal: Positive for arthralgias, back pain and gait problem. Negative for joint swelling and neck pain.  Skin: Negative for rash.  Neurological: Positive for dizziness and light-headedness. Negative for tremors, syncope, speech difficulty and weakness.  Psychiatric/Behavioral: Negative for agitation, dysphoric mood and sleep disturbance. The patient is not nervous/anxious.     Objective:  BP (!) 150/72 (BP Location: Left Arm, Patient Position: Sitting, Cuff Size: Large)    Pulse 85    Temp 97.7 F (36.5 C) (Oral)    Ht _0  (1.803 m)    Wt 261 lb (118.4 kg)    SpO2 97%    BMI 36.40 kg/m   BP Readings from Last 3 Encounters:  02/14/19 (!) 150/72  02/01/19 (!) 157/67  01/31/19 104/60    Wt Readings from Last 3 Encounters:    02/14/19 261 lb (118.4 kg)  02/01/19 253 lb 3.2 oz (114.9 kg)  01/31/19 258 lb (117 kg)    Physical Exam Constitutional:      General: He is not in acute distress.    Appearance: He is well-developed.     Comments: NAD  Eyes:     Conjunctiva/sclera: Conjunctivae normal.     Pupils: Pupils are equal, round, and reactive to light.  Neck:     Musculoskeletal: Normal range of motion.     Thyroid: No thyromegaly.     Vascular: No JVD.  Cardiovascular:     Rate and Rhythm: Normal rate and regular rhythm.     Heart sounds: Normal heart sounds. No murmur. No friction rub. No gallop.   Pulmonary:     Effort: Pulmonary effort is normal. No respiratory distress.     Breath sounds: Normal breath sounds. No wheezing or rales.  Chest:     Chest wall: No tenderness.  Abdominal:     General: Bowel sounds are normal. There is no distension.     Palpations: Abdomen is soft. There is no mass.     Tenderness: There is no abdominal tenderness. There is no guarding or rebound.  Musculoskeletal: Normal range of motion.        General: No tenderness.  Lymphadenopathy:     Cervical: No cervical adenopathy.  Skin:    General: Skin is warm and dry.     Findings: No rash.  Neurological:     Mental Status: He is alert and oriented to person, place, and time.     Cranial Nerves: No cranial nerve deficit.     Motor: No abnormal muscle tone.     Coordination: Coordination normal.     Gait: Gait normal.     Deep Tendon Reflexes: Reflexes are normal and symmetric.  Psychiatric:        Behavior: Behavior normal.        Thought Content: Thought content normal.        Judgment: Judgment normal.   trace edema B  Lab Results  Component Value Date   WBC 3.4 (L) 02/01/2019   HGB 10.3 (L) 02/01/2019   HCT 30.7 (L) 02/01/2019   PLT 132 (L) 02/01/2019   GLUCOSE 151 (H) 02/01/2019   CHOL 121 02/01/2019   TRIG 503 (H) 02/01/2019   HDL 23 (L) 02/01/2019   LDLDIRECT 27.5 02/01/2019   LDLCALC UNABLE TO  CALCULATE IF TRIGLYCERIDE OVER 400 mg/dL 02/01/2019   ALT 22 01/04/2019   AST 23 01/04/2019   NA 138 02/01/2019   K 4.0 02/01/2019   CL 105 02/01/2019   CREATININE 2.45 (H) 02/01/2019   BUN 37 (H) 02/01/2019   CO2 23 02/01/2019   TSH 2.863 01/31/2019   PSA 0.50 11/07/2013   INR 1.1 01/31/2019   HGBA1C 6.2 01/18/2019   MICROALBUR 1.2 05/16/2013    Dg Chest 2 View  Result Date: 01/31/2019 CLINICAL DATA:  Mild chest tightness, shortness of breath and fatigue, atrial fibrillation, CHF, diabetes mellitus, coronary artery disease post MI, hypertension EXAM: CHEST - 2 VIEW COMPARISON:  01/04/2019, 12/28/2017 FINDINGS: Upper normal heart size. Mediastinal contours and pulmonary vascularity normal. Atherosclerotic calcification aorta. Lungs clear. Chronic blunting of the LEFT lateral costophrenic angle by either pleural fluid or thickening. On lateral view, chronic pleural based opacity is posteriorly, likely at the posterior lower LEFT lung, question loculated pleural effusion; this is unchanged since 12/28/2017. Bones unremarkable. IMPRESSION: Chronic LEFT pleural effusion versus pleural thickening at the LEFT base, and likely loculated at the posterior lower LEFT chest, unchanged since 12/28/2017. No definite acute abnormalities. Electronically Signed   By: Lavonia Dana M.D.   On: 01/31/2019 11:53    Assessment & Plan:   There are no diagnoses linked to this encounter.   No orders of the defined types were placed in this encounter.    Follow-up: No follow-ups on file.  [name], MD

## 2019-02-14 NOTE — Assessment & Plan Note (Signed)
Novolog 70/30

## 2019-02-14 NOTE — Assessment & Plan Note (Signed)
Norco prn  Potential benefits of a long term opioids use as well as potential risks (i.e. addiction risk, apnea etc) and complications (i.e. Somnolence, constipation and others) were explained to the patient and were aknowledged. 

## 2019-02-14 NOTE — Telephone Encounter (Signed)
error 

## 2019-02-14 NOTE — Progress Notes (Deleted)
CARDIOLOGY OFFICE NOTE  Date:  02/14/2019    Michael [name] Date of Birth: 1940/12/29 Medical Record #[id number]  PCP:  Cassandria Anger, MD  Cardiologist:  Johnsie Cancel & Lovena Le   No chief complaint on file.   History of Present Illness: Michael [name] is a 78 y.o. male who presents today for a follow up visit. Seen for Dr. Johnsie Cancel and Lovena Le.   He has a hx of CAD, carotid artery disease, paroxysmal AF, HTN, HLD, DM, & prior GI bleed (small bowel AVM).   He was admitted in 2016 with AF with RVR - placed on amiodarone and converted to NSR. Troponin elevated - LHC demonstrated mild to moderate CAD in the DX, LCX and OM - managed medically. There was also innominate artery stenosis of borderline significance. A follow up CTA demonstrated 50% R subclavian artery stenosis. He saw Dr. Kathlyn Sacramento in 11/16 and his R arm symptoms were felt to mainly related to psoriatic arthritis and no vascular intervention was felt to be necessary.   Admitted in late 2018 with NSTEMI - cath with 80% hazy stenosis involving the proximal OM1 - treated with DES - there was mild to moderate nonobstructive disease in the diagonal, LAD, AV groove circumflex and RCA. He had mildly elevated left and right heart filling pressures as well as mild pulmonary hypertension. Triple therapy was recommended with aspirin, clopidogrel and apixaban 1 month. After 30 days, aspirin was discontinued. His creatinine increased after cardiac catheterization. Echocardiogram demonstrated normal ejection fraction, moderate diastolic dysfunction, moderate aortic stenosis and moderate pulmonary hypertension. His hospital course complicated by left wrist cellulitis with MSSA bacteremia requiring IV antibiotics as well as diarrhea with suspected C. difficile (toxin was negative). He was readmitted with acute on chronic diastolic HF in the setting of anemia - required transfusion. Multiple small AVMs noted. Continued on DAPT and holding of  Eliquis.   Readmitted later in 2018 with AF with RVR - his DAPT was to finish in 07/2017 - started on amiodarone at that time. Seen by Dr. Lovena Le. CHADSVASC of at least 5.   He was last seen by Cecilie Kicks, NP in July of 2019 for general cardiology. Saw Dr. Lovena Le for a telehealth visit last month.   He was admitted earlier this month with recurrent AF - CXR with chronic left pleural effusion. Not on anticoagulation due to recurrent iron deficiency anemia. Had elevated troponin - felt to be due to AF. Trigger unclear.   The patient {does/does not:200015} have symptoms concerning for COVID-19 infection (fever, chills, cough, or new shortness of breath).   Comes in today. Here with   Past Medical History:  Diagnosis Date   Acute on chronic diastolic (congestive) heart failure ([place]) 02/05/2017   Arthritis    AVM (arteriovenous malformation) of colon    AVM (arteriovenous malformation) of small bowel, acquired with hemorrhage 01/20/2012   Found on small bowel capsule endoscopy, June 2013    Carotid artery disease ([place])    a. Carotid dopple 03/2014 41-93% RICA &  79-02% LICA stenosis b. 4/09   CKD (chronic kidney disease), stage III (HCC)    Clostridium difficile colitis 05/13/2016   Coronary artery disease involving native coronary artery of native heart without angina pectoris 07/31/2016   Lopressor - d/c 7/19, Lipitor and ASA   GERD (gastroesophageal reflux disease)    Gout    H/O transfusion of whole blood    Hx of adenomatous colonic polyps 07/02/2016   Hyperlipidemia  Hypertension    Hypothyroidism    Iron deficiency anemia    Nonrheumatic aortic valve stenosis    NSTEMI (non-ST elevated myocardial infarction) ([place])    hx/notes 06/25/2017   PAD (peripheral artery disease) ([place]) 07/31/2016   On ASA, Plavix   Personal history of colonic polyps 2007, 2008   adenoma each time, largest 12 mm in 2007   Psoriasis    Septic phlebitis of upper extremity 08/26/2016    2018 L hand Dr Megan Salon f/u Keflex and Vanc po   Staph infection 07/2016   left hand    Subclavian artery stenosis, right ([place]) 01/16/2015   Type II diabetes mellitus ([place])    TYPE 2    Past Surgical History:  Procedure Laterality Date   APPENDECTOMY  02/09/2014   CARDIAC CATHETERIZATION N/A 07/29/2016   Procedure: Right/Left Heart Cath and Coronary Angiography;  Surgeon: Nelva Bush, MD;  Location: [facility];  Service: Cardiovascular;  Laterality: N/A;   CARDIAC CATHETERIZATION N/A 07/29/2016   Procedure: Coronary Stent Intervention;  Surgeon: Nelva Bush, MD;  Location: [facility];  Service: Cardiovascular;  Laterality: N/A;   CARDIAC CATHETERIZATION  10/2014   COLONOSCOPY  02/10/2011   internal hemorrhoids   COLONOSCOPY W/ POLYPECTOMY  04/09/2006   12 mm adenoma   COLONOSCOPY W/ POLYPECTOMY  06/17/2007   5 mm adenoma   COLONOSCOPY WITH PROPOFOL N/A 05/13/2016   Procedure: COLONOSCOPY WITH PROPOFOL;  Surgeon: Manus Gunning, MD;  Location: [facility];  Service: Gastroenterology;  Laterality: N/A;   ENTEROSCOPY N/A 08/14/2016   Procedure: ENTEROSCOPY;  Surgeon: Manus Gunning, MD;  Location: [facility];  Service: Gastroenterology;  Laterality: N/A;   ESOPHAGOGASTRODUODENOSCOPY  12/23/2011   Procedure: ESOPHAGOGASTRODUODENOSCOPY (EGD);  Surgeon: Irene Shipper, MD;  Location: [facility];  Service: Endoscopy;  Laterality: N/A;  with small bowel bx's   GIVENS CAPSULE STUDY  12/28/2011   GIVENS CAPSULE STUDY N/A 08/14/2016   Procedure: GIVENS CAPSULE STUDY;  Surgeon: Manus Gunning, MD;  Location: [facility];  Service: Gastroenterology;  Laterality: N/A;   KNEE ARTHROSCOPY Right    LAPAROSCOPIC APPENDECTOMY N/A 02/09/2014   Procedure: APPENDECTOMY LAPAROSCOPIC;  Surgeon: Leighton Ruff, MD;  Location: [facility];  Service: General;  Laterality: N/A;   LEFT HEART CATHETERIZATION WITH CORONARY ANGIOGRAM N/A 11/15/2014   Procedure:  LEFT HEART CATHETERIZATION WITH CORONARY ANGIOGRAM;  Surgeon: Belva Crome, MD;  Location: [facility] C[facility] LAB;  Service: Cardiovascular;  Laterality: N/A;   SHOULDER OPEN ROTATOR CUFF REPAIR Left      Medications: No outpatient medications have been marked as taking for the 02/15/19 encounter (Appointment) with Burtis Junes, NP.     Allergies: Allergies  Allergen Reactions   Percocet [Oxycodone-Acetaminophen] Nausea And Vomiting   Ciprofloxacin Other (See Comments)    Light headed and made stagger   Metformin And Related Nausea Only    Upset stomach   Invokana [Canagliflozin] Rash    Social History: The patient  reports that he quit smoking about 12 years ago. His smoking use included cigarettes. He has a 33.60 pack-year smoking history. He has never used smokeless tobacco. He reports current alcohol use of about 1.0 standard drinks of alcohol per week. He reports that he does not use drugs.   Family History: The patient's ***family history includes COPD in his sister; Cancer in his brother; Diabetes in his father; Heart attack in his father; Heart disease in his brother; Hypertension in his father and mother; Lung cancer in  his father; Stroke in his father and mother.   Review of Systems: Please see the history of present illness.   All other systems are reviewed and negative.   Physical Exam: VS:  There were no vitals taken for this visit. [name]  BMI There is no height or weight on file to calculate BMI.  Wt Readings from Last 3 Encounters:  02/01/19 253 lb 3.2 oz (114.9 kg)  01/31/19 258 lb (117 kg)  01/18/19 258 lb (117 kg)    General: Pleasant. Well developed, well nourished and in no acute distress.   HEENT: Normal.  Neck: Supple, no JVD, carotid bruits, or masses noted.  Cardiac: ***Regular rate and rhythm. No murmurs, rubs, or gallops. No edema.  Respiratory:  Lungs are clear to auscultation bilaterally with normal work of breathing.  GI: Soft and nontender.  MS: No  deformity or atrophy. Gait and ROM intact.  Skin: Warm and dry. Color is normal.  Neuro:  Strength and sensation are intact and no gross focal deficits noted.  Psych: Alert, appropriate and with normal affect.   LABORATORY DATA:  EKG:  EKG {ACTION; IS/IS IOE:70350093} ordered today. This demonstrates ***.  Lab Results  Component Value Date   WBC 3.4 (L) 02/01/2019   HGB 10.3 (L) 02/01/2019   HCT 30.7 (L) 02/01/2019   PLT 132 (L) 02/01/2019   GLUCOSE 151 (H) 02/01/2019   CHOL 121 02/01/2019   TRIG 503 (H) 02/01/2019   HDL 23 (L) 02/01/2019   LDLDIRECT 27.5 02/01/2019   LDLCALC UNABLE TO CALCULATE IF TRIGLYCERIDE OVER 400 mg/dL 02/01/2019   ALT 22 01/04/2019   AST 23 01/04/2019   NA 138 02/01/2019   K 4.0 02/01/2019   CL 105 02/01/2019   CREATININE 2.45 (H) 02/01/2019   BUN 37 (H) 02/01/2019   CO2 23 02/01/2019   TSH 2.863 01/31/2019   PSA 0.50 11/07/2013   INR 1.1 01/31/2019   HGBA1C 6.2 01/18/2019   MICROALBUR 1.2 05/16/2013     BNP (last 3 results) Recent Labs    01/04/19 0142  BNP 152.1*    ProBNP (last 3 results) No results for input(s): PROBNP in the last 8760 hours.   Other Studies Reviewed Today:  ECHO IMPRESSIONS 12/2018   1. The left ventricle has normal systolic function with an ejection fraction of 60-65%. The cavity size was normal. There is severely increased left ventricular wall thickness. Left ventricular diastolic Doppler parameters are consistent with  pseudonormalization. Elevated left ventricular end-diastolic pressure The E/e' is 30.  2. The right ventricle has normal systolic function. The cavity was normal. There is no increase in right ventricular wall thickness.  3. Left atrial size was severely dilated.  4. Right atrial size was mildly dilated.  5. The mitral valve is grossly normal. There is mild mitral annular calcification present.  6. The aortic valve was not well visualized. Moderate calcification of the aortic valve.  Mild-moderate stenosis of the aortic valve. Mean systolic gradient approximately 26.5 mmHg.  7. The aortic root and ascending aorta are normal in size and structure.  8. The inferior vena cava was dilated in size with >50% respiratory variability.   Carotid US 01/21/18 Final Interpretation: Right Carotid: Velocities in the right ICA are consistent with a 40-59%        stenosis. (based on PSV and plaque).  Left Carotid: Velocities in the left ICA are consistent with a 40-59% stenosis.  Vertebrals: Left vertebral artery demonstrates antegrade flow. Abnormal right  vertebral artery flow with mild right subclavian steal. Subclavians: Right subclavian artery was stenotic. Normal flow hemodynamics were       seen in the left subclavian artery.   Coronary Stent Intervention 07/2016  Right/Left Heart Cath and Coronary Angiography  Conclusion    There is mild aortic valve stenosis.   Conclusions: 1. 80%, hazy stenosis involving proximal OM1, most likely representing culprit lesion. 2. Stable appearance of 60% D1 stenosis. 3. Mild to moderate non-obstructive CAD involving the LAD, AV-groove LCx, and RCA. 4. Mildly elevated left and right heart filling pressures. 5. Mild pulmonary hypertension. 6. Low normal Fick cardiac output. 7. Mild aortic stenosis. 8. Successful PCI to OM1 with placement of a Synergy 2.25 x 38 mm drug-eluting stent (post-dilated with 2.5 mm Angus balloon) with 0% residual stenosis and TIMI-3 flow.  Recommendations: 1. Triple therapy with aspirin, clopidogrel, and apixaban x 1 month, after which time discontinuation of aspirin can be considered. 2. Aggressive secondary prevention. 3. Gentle hydration this afternoon, given CKD and mildly elevated left and right heart pressures. 4. Remove right femoral venous and arterial sheaths 2 hours after discontinuation of bivalirudin.  Nelva Bush, MD [facility]      ASSESSMENT AND  PLAN:  1.  AF with RVR  2. CAD - with prior NSTEMI and DES to [place] - residual disease managed medically - low risk Myoview from 07/2016.   3. Chronic iron deficiency anemia - has known AVMS - prior GI bleeding requiring transfusion  4. Diastolic HF  5. AS -   6. Carotid disease -   7. HTN controlled  8. HLD on statin  9. DM - per PCP  10. CKD  11. PVD - has seen Dr. Fletcher Anon in the past -    12. COVID-19 Education: The signs and symptoms of COVID-19 were discussed with the patient and how to seek care for testing (follow up with PCP or arrange E-visit).  The importance of social distancing, staying at home, hand hygiene and wearing a mask when out in public were discussed today.  Current medicines are reviewed with the patient today.  The patient does not have concerns regarding medicines other than what has been noted above.  The following changes have been made:  See above.  Labs/ tests ordered today include:   No orders of the defined types were placed in this encounter.    Disposition:   FU with *** in {gen number 3-78:588502} {Days to years:10300}.   Patient is agreeable to this plan and will call if any problems develop in the interim.   SignedTruitt Merle, NP  02/14/2019 7:23 AM  [facility] [address] [place], [place]  [address] Phone: [phone] Fax: [phone]

## 2019-02-14 NOTE — Assessment & Plan Note (Signed)
In NSR now Diltiazem d/c, Furosemide, Hydralazine , Amiodarone, Metoprolol

## 2019-02-14 NOTE — Patient Instructions (Signed)
If you have medicare related insurance (such as traditional Medicare, Blue Cross Medicare, United HealthCare Medicare, or similar), Please make an appointment at the scheduling desk with Jill, the Wellness Health Coach, for your Wellness visit in this office, which is a benefit with your insurance.  

## 2019-02-15 ENCOUNTER — Ambulatory Visit: Payer: Medicare Other | Admitting: Nurse Practitioner

## 2019-02-16 ENCOUNTER — Other Ambulatory Visit: Payer: Self-pay | Admitting: Cardiovascular Disease

## 2019-02-17 ENCOUNTER — Telehealth: Payer: Self-pay | Admitting: *Deleted

## 2019-02-17 NOTE — Telephone Encounter (Signed)
Lvm on phone and cell for contact back for covid-19 screen. If not able to call back  Before 5 patient will be screened  On the first floor before coming up

## 2019-02-17 NOTE — Progress Notes (Signed)
CARDIOLOGY OFFICE NOTE  Date:  02/20/2019    Michael [name] Date of Birth: [birth year] Medical Record #[id number]  PCP:  Cassandria Anger, MD  Cardiologist:  Johnsie Cancel [facility]    Chief Complaint  Patient presents with  . Follow-up    Seen for Dr. Johnsie Cancel & Lovena Le    History of Present Illness: Michael [name] is a 78 y.o. male who presents today for a follow up visit. Seen for Dr. Johnsie Cancel & Lovena Le.   He has a history of AF, HTN, obesity, CAD, PAF, moderate AS and chronic diastolic HF. Last cath in 2018 after presented with NSTEMI led to DES to [place] and otherwise had nonobstructive CAD. Has had prior GI bleed due to AVMs. Not on anticoagulation.   Last seen by Dr. Johnsie Cancel in 08/2017. Saw Cecilie Kicks, NP for general cardiology visit in July of 2019.   Last seen for telehealth visit by Dr. Lovena Le in June 2020 - to continue 6 days a week of amiodarone therapy. Had just been discharged with diastolic HF exacerbation prior.   Admitted earlier this month with AF with associated chest tightness, SOB and fatigue.   He converted to NSR. Troponin elevated and felt to due to AF. Metoprolol was added back and his amiodarone was increased back to every day.  Not on anticoagulation due to recurrent iron deficiency anemia.    The patient does not have symptoms concerning for COVID-19 infection (fever, chills, cough, or new shortness of breath).   Comes in today. Here alone. He was trying to get a shot in his knee - noted elevated HR and was sent to the hospital. Back in NSR but has had some recurrence. Feels weak with his AF when he has it. Notes some transient dizziness/lightheadedness with the addition of Metoprolol. Tells me he used to be on a higher dose - had to be cut back due to marked bradycardia. No chest pain. Not very active - pretty sedentary. No syncope. No active bleeding. Only on aspirin. Says is breathing is stable. He has no real concerns.   Past Medical History:  Diagnosis Date   . Acute on chronic diastolic (congestive) heart failure ([place]) 02/05/2017  . Arthritis   . AVM (arteriovenous malformation) of colon   . AVM (arteriovenous malformation) of small bowel, acquired with hemorrhage 01/20/2012   Found on small bowel capsule endoscopy, June 2013   . Carotid artery disease ([place])    a. Carotid dopple 03/2014 54-56% RICA &  25-63% LICA stenosis b. 8/93  . CKD (chronic kidney disease), stage III ([place])   . Clostridium difficile colitis 05/13/2016  . Coronary artery disease involving native coronary artery of native heart without angina pectoris 07/31/2016   Lopressor - d/c 7/19, Lipitor and ASA  . GERD (gastroesophageal reflux disease)   . Gout   . H/O transfusion of whole blood   . Hx of adenomatous colonic polyps 07/02/2016  . Hyperlipidemia   . Hypertension   . Hypothyroidism   . Iron deficiency anemia   . Nonrheumatic aortic valve stenosis   . NSTEMI (non-ST elevated myocardial infarction) ([place])    hx/notes 06/25/2017  . PAD (peripheral artery disease) ([place]) 07/31/2016   On ASA, Plavix  . Personal history of colonic polyps 2007, 2008   adenoma each time, largest 12 mm in 2007  . Psoriasis   . Septic phlebitis of upper extremity 08/26/2016   2018 L hand Dr Megan Salon f/u Keflex and Vanc po  .  Staph infection 07/2016   left hand   . Subclavian artery stenosis, right ([place]) 01/16/2015  . Type II diabetes mellitus ([place])    TYPE 2    Past Surgical History:  Procedure Laterality Date  . APPENDECTOMY  02/09/2014  . CARDIAC CATHETERIZATION N/A 07/29/2016   Procedure: Right/Left Heart Cath and Coronary Angiography;  Surgeon: Nelva Bush, MD;  Location: [place] [facility];  Service: Cardiovascular;  Laterality: N/A;  . CARDIAC CATHETERIZATION N/A 07/29/2016   Procedure: Coronary Stent Intervention;  Surgeon: Nelva Bush, MD;  Location: [place] [facility];  Service: Cardiovascular;  Laterality: N/A;  . CARDIAC CATHETERIZATION  10/2014  . COLONOSCOPY  02/10/2011    internal hemorrhoids  . COLONOSCOPY W/ POLYPECTOMY  04/09/2006   12 mm adenoma  . COLONOSCOPY W/ POLYPECTOMY  06/17/2007   5 mm adenoma  . COLONOSCOPY WITH PROPOFOL N/A 05/13/2016   Procedure: COLONOSCOPY WITH PROPOFOL;  Surgeon: Manus Gunning, MD;  Location: [facility]COPY;  Service: Gastroenterology;  Laterality: N/A;  . ENTEROSCOPY N/A 08/14/2016   Procedure: ENTEROSCOPY;  Surgeon: Manus Gunning, MD;  Location[facility] END[facility]COPY;  Service: Gastroenterology;  Laterality: N/A;  . ESOPHAGOGASTRODUODENOSCOPY  12/23/2011   Procedure: ESOPHAGOGASTRODUODENOSCOPY (EGD);  Surgeon: Irene Shipper, MD;  Location: [facility];  Service: Endoscopy;  Laterality: N/A;  with small bowel bx's  . GIVENS CAPSULE STUDY  12/28/2011  . GIVENS CAPSULE STUDY N/A 08/14/2016   Procedure: GIVENS CAPSULE STUDY;  Surgeon: Manus Gunning, MD;  Location: [place];  Service: Gastroenterology;  Laterality: N/A;  . KNEE ARTHROSCOPY Right   . LAPAROSCOPIC APPENDECTOMY N/A 02/09/2014   Procedure: APPENDECTOMY LAPAROSCOPIC;  Surgeon: Leighton Ruff, MD;  Location: [facility];  Service: General;  Laterality: N/A;  . LEFT HEART CATHETERIZATION WITH CORONARY ANGIOGRAM N/A 11/15/2014   Procedure: LEFT HEART CATHETERIZATION WITH CORONARY ANGIOGRAM;  Surgeon: Belva Crome, MD;  Location: [facility] CATH LAB;  Service: Cardiovascular;  Laterality: N/A;  . SHOULDER OPEN ROTATOR CUFF REPAIR Left      Medications: Current Meds  Medication Sig  . allopurinol (ZYLOPRIM) 100 MG tablet Take 2 tablets (275m total) by mouth daily  . amiodarone (PACERONE) 200 MG tablet Take 1 tablet (200 mg total) by mouth daily.  .Marland Kitchenaspirin 81 MG chewable tablet Chew 1 tablet (81 mg total) by mouth daily.  .Marland Kitchenatorvastatin (LIPITOR) 40 MG tablet Take 1 tablet by mouth daily  . b complex vitamins tablet Take 1 tablet by mouth daily.  . Blood Glucose Monitoring Suppl (ONE TOUCH ULTRA 2) W/DEVICE KIT Use as directed Dx E11.9  . Blood Pressure Monitor  KIT Use to check blood pressure daily Dx I10  . CARTIA XT 180 MG 24 hr capsule Take 1 capsule by mouth once daily  . Cholecalciferol (VITAMIN D-3) 1000 units CAPS Take 1,000-5,000 Units by mouth See admin instructions. 1,000 units once a day on Sun/Mon/Tues/Wed/Thurs/Fri and 5,000 units on Sat  . collagenase (SANTYL) ointment Apply 1 application topically daily.  . fenofibrate 160 MG tablet Take 1 tablet by mouth daily (Please schedule appointment for future refills)  . ferrous sulfate 325 (65 FE) MG tablet Take 325 mg by mouth 3 (three) times daily with meals.  . furosemide (LASIX) 40 MG tablet Take 1 tablet (40 mg total) by mouth 2 (two) times daily.  .Marland Kitchengabapentin (NEURONTIN) 100 MG capsule Take 1 capsule by mouth twice a day  . glucose blood (ONETOUCH ULTRA) test strip CHECK BLOOD GLUCOSE (SUGAR) 4 TIMES DAILY DX: E11.9, Z79.4 (  pt on insulin)  . hydrALAZINE (APRESOLINE) 50 MG tablet Take 1 tablet (50 mg total) by mouth 3 (three) times daily.  [name] HYDROcodone-acetaminophen (NORCO/VICODIN) 5-325 MG tablet Take 1 tablet by mouth every 6 (six) hours as needed for severe pain.  [name] insulin aspart protamine - aspart (NOVOLOG 70/30 MIX) (70-30) 100 UNIT/ML FlexPen Inject 0.5 mLs (50 Units total) into the skin 2 (two) times daily with a meal.  . Insulin Pen Needle 32G X 4 MM MISC Use to administer insulin  . Lancets (ONETOUCH ULTRASOFT) lancets Use to check blood sugars daily  . levothyroxine (SYNTHROID) 150 MCG tablet Take 1 tablet (150 mcg total) by mouth daily before breakfast.  . metoprolol tartrate (LOPRESSOR) 25 MG tablet Take 0.5 tablets (12.5 mg total) by mouth 2 (two) times daily.  [name] NEEDLE, DISP, 30 G (BD DISP NEEDLES) 30G X 1/2" MISC USE WITH 1 SYRINGE SUBCUTANEOUSLY  TWICE DAILY  . nitroGLYCERIN (NITROSTAT) 0.4 MG SL tablet Place 1 tablet under tongue for chest pain. Repeat every 5 minutes as needed, up to 3 doses. If no relief after 3 doses, call 911.  . potassium chloride SA (K-DUR,KLOR-CON)  20 MEQ tablet Take 1 tablet by mouth daily     Allergies: Allergies  Allergen Reactions  . Percocet [Oxycodone-Acetaminophen] Nausea And Vomiting  . Ciprofloxacin Other (See Comments)    Light headed and made stagger  . Metformin And Related Nausea Only    Upset stomach  . Invokana [Canagliflozin] Rash    Social History: The patient  reports that he quit smoking about 12 years ago. His smoking use included cigarettes. He has a 33.60 pack-year smoking history. He has never used smokeless tobacco. He reports current alcohol use of about 1.0 standard drinks of alcohol per week. He reports that he does not use drugs.   Family History: The patient's family history includes COPD in his sister; Cancer in his brother; Diabetes in his father; Heart attack in his father; Heart disease in his brother; Hypertension in his father and mother; Lung cancer in his father; Stroke in his father and mother.   Review of Systems: Please see the history of present illness.   All other systems are reviewed and negative.   Physical Exam: VS:  BP (!) 142/78   Pulse (!) 59   Ht 5' 11" (1.803 m)   Wt 262 lb 12.8 oz (119.2 kg)   SpO2 98%   BMI 36.65 kg/m  .  BMI Body mass index is 36.65 kg/m.  Wt Readings from Last 3 Encounters:  02/20/19 262 lb 12.8 oz (119.2 kg)  02/14/19 261 lb (118.4 kg)  02/01/19 253 lb 3.2 oz (114.9 kg)    General: Elderly. Quite chatty. Obese. Alert and in no acute distress. His weight is up 10 pounds in the last 11 months.  HEENT: Normal.  Neck: Supple, no JVD, carotid bruits, or masses noted.  Cardiac: Regular rate and rhythm. Outflow murmur noted. No edema.  Respiratory:  Lungs are clear to auscultation bilaterally with normal work of breathing.  GI: Soft and nontender.  MS: No deformity or atrophy. Gait and ROM intact.  Skin: Warm and dry. Color is normal.  Neuro:  Strength and sensation are intact and no gross focal deficits noted.  Psych: Alert, appropriate and with  normal affect.   LABORATORY DATA:  EKG:  EKG is ordered today. This demonstrates sinus bradycardia - diffuse ST & T wave changes. HR is 59 today.   Lab Results  Component  Value Date   WBC 3.4 (L) 02/01/2019   HGB 10.3 (L) 02/01/2019   HCT 30.7 (L) 02/01/2019   PLT 132 (L) 02/01/2019   GLUCOSE 151 (H) 02/01/2019   CHOL 121 02/01/2019   TRIG 503 (H) 02/01/2019   HDL 23 (L) 02/01/2019   LDLDIRECT 27.5 02/01/2019   LDLCALC UNABLE TO CALCULATE IF TRIGLYCERIDE OVER 400 mg/dL 02/01/2019   ALT 22 01/04/2019   AST 23 01/04/2019   NA 138 02/01/2019   K 4.0 02/01/2019   CL 105 02/01/2019   CREATININE 2.45 (H) 02/01/2019   BUN 37 (H) 02/01/2019   CO2 23 02/01/2019   TSH 2.863 01/31/2019   PSA 0.50 11/07/2013   INR 1.1 01/31/2019   HGBA1C 6.2 01/18/2019   MICROALBUR 1.2 05/16/2013     BNP (last 3 results) Recent Labs    01/04/19 0142  BNP 152.1*    ProBNP (last 3 results) No results for input(s): PROBNP in the last 8760 hours.   Other Studies Reviewed Today:  ECHO IMPRESSIONS 12/2018   1. The left ventricle has normal systolic function with an ejection fraction of 60-65%. The cavity size was normal. There is severely increased left ventricular wall thickness. Left ventricular diastolic Doppler parameters are consistent with  pseudonormalization. Elevated left ventricular end-diastolic pressure The E/e' is 30.  2. The right ventricle has normal systolic function. The cavity was normal. There is no increase in right ventricular wall thickness.  3. Left atrial size was severely dilated.  4. Right atrial size was mildly dilated.  5. The mitral valve is grossly normal. There is mild mitral annular calcification present.  6. The aortic valve was not well visualized. Moderate calcification of the aortic valve. Mild-moderate stenosis of the aortic valve. Mean systolic gradient approximately 26.5 mmHg.  7. The aortic root and ascending aorta are normal in size and structure.  8.  The inferior vena cava was dilated in size with >50% respiratory variability.    Coronary Stent Intervention 07/2016  Right/Left Heart Cath and Coronary Angiography  Conclusion    There is mild aortic valve stenosis.   Conclusions: 1. 80%, hazy stenosis involving proximal OM1, most likely representing culprit lesion. 2. Stable appearance of 60% D1 stenosis. 3. Mild to moderate non-obstructive CAD involving the LAD, AV-groove LCx, and RCA. 4. Mildly elevated left and right heart filling pressures. 5. Mild pulmonary hypertension. 6. Low normal Fick cardiac output. 7. Mild aortic stenosis. 8. Successful PCI to OM1 with placement of a Synergy 2.25 x 38 mm drug-eluting stent (post-dilated with 2.5 mm [place] balloon) with 0% residual stenosis and TIMI-3 flow.  Recommendations: 1. Triple therapy with aspirin, clopidogrel, and apixaban x 1 month, after which time discontinuation of aspirin can be considered. 2. Aggressive secondary prevention. 3. Gentle hydration this afternoon, given CKD and mildly elevated left and right heart pressures. 4. Remove right femoral venous and arterial sheaths 2 hours after discontinuation of bivalirudin.  Nelva Bush, MD [facility] Pager: [phone]     Assessment/Plan:  1. PAF - noted biatrial enlargement on his last echo - may make staying in NSR challenging - sounds like some degree of tachy/brady as well - at some point may need PPM - back on low dose Metoprolol and amiodarone every day now - will need to monitor.   2. Hx GI bleed/AVMs/iron deficiency anemia - recent HGB noted - only aspirin.   3. CAD - prior DES - no active chest pain.   4. HTN - BP is  fair. Would follow for now.   5. Diastolic HF - needs good salt restriction.   6. DM - per PCP  7. HLD with marked hypertriglyceridemia - remains on statin.   8. CKD - not followed by nephrology - rechecking today.   9. Mild to moderate AS - noted on recent echo - would follow.    10. COVID-19 Education: The signs and symptoms of COVID-19 were discussed with the patient and how to seek care for testing (follow up with PCP or arrange E-visit).  The importance of social distancing, staying at home, hand hygiene and wearing a mask when out in public were discussed today.  Current medicines are reviewed with the patient today.  The patient does not have concerns regarding medicines other than what has been noted above.  The following changes have been made:  See above.  Labs/ tests ordered today include:    Orders Placed This Encounter  Procedures  . Basic metabolic panel  . CBC  . EKG 12-Lead     Disposition:   FU with Dr. Johnsie Cancel for his regular visit in September as planned. Has recall with EP for December 2020.   Patient is agreeable to this plan and will call if any problems develop in the interim.   SignedTruitt Merle, NP  02/20/2019 9:30 AM  [facility] [address] [place], [place]  [address] Phone: [phone] Fax: [phone]

## 2019-02-20 ENCOUNTER — Encounter: Payer: Self-pay | Admitting: Nurse Practitioner

## 2019-02-20 ENCOUNTER — Other Ambulatory Visit: Payer: Self-pay

## 2019-02-20 ENCOUNTER — Ambulatory Visit (INDEPENDENT_AMBULATORY_CARE_PROVIDER_SITE_OTHER): Payer: Medicare Other | Admitting: Nurse Practitioner

## 2019-02-20 VITALS — BP 142/78 | HR 59 | Ht 71.0 in | Wt 262.8 lb

## 2019-02-20 DIAGNOSIS — Z7189 Other specified counseling: Secondary | ICD-10-CM | POA: Diagnosis not present

## 2019-02-20 DIAGNOSIS — I350 Nonrheumatic aortic (valve) stenosis: Secondary | ICD-10-CM

## 2019-02-20 DIAGNOSIS — I495 Sick sinus syndrome: Secondary | ICD-10-CM | POA: Diagnosis not present

## 2019-02-20 DIAGNOSIS — E785 Hyperlipidemia, unspecified: Secondary | ICD-10-CM

## 2019-02-20 DIAGNOSIS — I2510 Atherosclerotic heart disease of native coronary artery without angina pectoris: Secondary | ICD-10-CM

## 2019-02-20 DIAGNOSIS — I480 Paroxysmal atrial fibrillation: Secondary | ICD-10-CM

## 2019-02-20 LAB — CBC
Hematocrit: 33.1 % — ABNORMAL LOW (ref 37.5–51.0)
Hemoglobin: 11.2 g/dL — ABNORMAL LOW (ref 13.0–17.7)
MCH: 31.5 pg (ref 26.6–33.0)
MCHC: 33.8 g/dL (ref 31.5–35.7)
MCV: 93 fL (ref 79–97)
Platelets: 181 10*3/uL (ref 150–450)
RBC: 3.55 x10E6/uL — ABNORMAL LOW (ref 4.14–5.80)
RDW: 15.7 % — ABNORMAL HIGH (ref 11.6–15.4)
WBC: 4 10*3/uL (ref 3.4–10.8)

## 2019-02-20 LAB — BASIC METABOLIC PANEL
BUN/Creatinine Ratio: 15 (ref 10–24)
BUN: 35 mg/dL — ABNORMAL HIGH (ref 8–27)
CO2: 22 mmol/L (ref 20–29)
Calcium: 8.6 mg/dL (ref 8.6–10.2)
Chloride: 99 mmol/L (ref 96–106)
Creatinine, Ser: 2.37 mg/dL — ABNORMAL HIGH (ref 0.76–1.27)
GFR calc Af Amer: 30 mL/min/{1.73_m2} — ABNORMAL LOW (ref >59)
GFR calc non Af Amer: 26 mL/min/{1.73_m2} — ABNORMAL LOW (ref >59)
Glucose: 236 mg/dL — ABNORMAL HIGH (ref 65–99)
Potassium: 5.1 mmol/L (ref 3.5–5.2)
Sodium: 136 mmol/L (ref 134–144)

## 2019-02-20 NOTE — Patient Instructions (Addendum)
After Visit Summary:  We will be checking the following labs today - BMET & CBC   Medication Instructions:    Continue with your current medicines.    If you need a refill on your cardiac medications before your next appointment, please call your pharmacy.     Testing/Procedures To Be Arranged:  N/A  Follow-Up:   See Dr. Johnsie Cancel as planned in September.     At [facility], you and your health needs are our priority.  As part of our continuing mission to provide you with exceptional heart care, we have created designated Provider Care Teams.  These Care Teams include your primary Cardiologist (physician) and Advanced Practice Providers (APPs -  Physician Assistants and Nurse Practitioners) who all work together to provide you with the care you need, when you need it.  Special Instructions:  . Stay safe, stay home, wash your hands for at least 20 seconds and wear a mask when out in public.  . It was good to talk with you today.  . Try to move around more.  [name] Keep track of how much atrial fib you are having.    Call the [place] office at [phone] if you have any questions, problems or concerns.

## 2019-02-22 ENCOUNTER — Other Ambulatory Visit: Payer: Self-pay | Admitting: *Deleted

## 2019-02-22 DIAGNOSIS — N183 Chronic kidney disease, stage 3 unspecified: Secondary | ICD-10-CM

## 2019-03-07 ENCOUNTER — Ambulatory Visit: Payer: Medicare Other | Admitting: Sports Medicine

## 2019-03-15 DIAGNOSIS — I129 Hypertensive chronic kidney disease with stage 1 through stage 4 chronic kidney disease, or unspecified chronic kidney disease: Secondary | ICD-10-CM | POA: Diagnosis not present

## 2019-03-15 DIAGNOSIS — N183 Chronic kidney disease, stage 3 (moderate): Secondary | ICD-10-CM | POA: Diagnosis not present

## 2019-03-15 DIAGNOSIS — I2510 Atherosclerotic heart disease of native coronary artery without angina pectoris: Secondary | ICD-10-CM | POA: Diagnosis not present

## 2019-03-15 DIAGNOSIS — E1122 Type 2 diabetes mellitus with diabetic chronic kidney disease: Secondary | ICD-10-CM | POA: Diagnosis not present

## 2019-03-15 DIAGNOSIS — N184 Chronic kidney disease, stage 4 (severe): Secondary | ICD-10-CM | POA: Diagnosis not present

## 2019-03-16 NOTE — Progress Notes (Signed)
CARDIOLOGY OFFICE NOTE  Date:  03/29/2019    [name] Date of Birth: 20-Jan-1941 Medical Record #[id number]  PCP:  Cassandria Anger, MD  Cardiologist:  Johnsie Cancel & Lovena Le    No chief complaint on file.   History of Present Illness: Michael [name] is a 78 y.o. male who presents today for a follow up visit. Seen for Dr. Johnsie Cancel & Lovena Le.   He has a history of AF, HTN, obesity, CAD, PAF, moderate AS and chronic diastolic HF. Last cath in 2018 after presented with NSTEMI led to DES to [place] and otherwise had nonobstructive CAD. Has had prior GI bleed due to AVMs. Not on anticoagulation.   Last seen by Dr. Johnsie Cancel in 08/2017. Saw Cecilie Kicks, NP for general cardiology visit in July of 2019.   Last seen for telehealth visit by Dr. Lovena Le in June 2020 - to continue 6 days a week of amiodarone therapy. Had just been discharged with diastolic HF exacerbation prior.   Admitted earlier this month with AF with associated chest tightness, SOB and fatigue.   He converted to NSR. Troponin elevated and felt to due to AF. Metoprolol was added back and his amiodarone was increased back to every day.  Not on anticoagulation due to recurrent iron deficiency anemia.    Having some postural symptoms no chest pain CHF or palpitations   The patient does not have symptoms concerning for COVID-19 infection (fever, chills, cough, or new shortness of breath).     Past Medical History:  Diagnosis Date  . Acute on chronic diastolic (congestive) heart failure ([place]) 02/05/2017  . Arthritis   . AVM (arteriovenous malformation) of colon   . AVM (arteriovenous malformation) of small bowel, acquired with hemorrhage 01/20/2012   Found on small bowel capsule endoscopy, June 2013   . Carotid artery disease ([place])    a. Carotid dopple 03/2014 73-53% RICA &  29-92% LICA stenosis b. 4/26  . CKD (chronic kidney disease), stage III ([place])   . Clostridium difficile colitis 05/13/2016  . Coronary artery disease  involving native coronary artery of native heart without angina pectoris 07/31/2016   Lopressor - d/c 7/19, Lipitor and ASA  . GERD (gastroesophageal reflux disease)   . Gout   . H/O transfusion of whole blood   . Hx of adenomatous colonic polyps 07/02/2016  . Hyperlipidemia   . Hypertension   . Hypothyroidism   . Iron deficiency anemia   . Nonrheumatic aortic valve stenosis   . NSTEMI (non-ST elevated myocardial infarction) ([place])    hx/notes 06/25/2017  . PAD (peripheral artery disease) ([place]) 07/31/2016   On ASA, Plavix  . Personal history of colonic polyps 2007, 2008   adenoma each time, largest 12 mm in 2007  . Plaque psoriasis   . Psoriasis   . Septic phlebitis of upper extremity 08/26/2016   2018 L hand Dr Megan Salon f/u Keflex and Vanc po  . Staph infection 07/2016   left hand   . Subclavian artery stenosis, right ([place]) 01/16/2015  . Type II diabetes mellitus ([place])    TYPE 2    Past Surgical History:  Procedure Laterality Date  . APPENDECTOMY  02/09/2014  . CARDIAC CATHETERIZATION N/A 07/29/2016   Procedure: Right/Left Heart Cath and Coronary Angiography;  Surgeon: Nelva Bush, MD;  Location: [facility] CV [facility];  Service: Cardiovascular;  Laterality: N/A;  . CARDIAC CATHETERIZATION N/A 07/29/2016   Procedure: Coronary Stent Intervention;  Surgeon: Nelva Bush, MD;  Location[facility]  INVASIVE CV LAB;  Service: Cardiovascular;  Laterality: N/A;  . CARDIAC CATHETERIZATION  10/2014  . COLONOSCOPY  02/10/2011   internal hemorrhoids  . COLONOSCOPY W/ POLYPECTOMY  04/09/2006   12 mm adenoma  . COLONOSCOPY W/ POLYPECTOMY  06/17/2007   5 mm adenoma  . COLONOSCOPY WITH PROPOFOL N/A 05/13/2016   Procedure: COLONOSCOPY WITH PROPOFOL;  Surgeon: Manus Gunning, MD;  Location: [facility] ENDOSCOPY;  Service: Gastroenterology;  Laterality: N/A;  . ENTEROSCOPY N/A 08/14/2016   Procedure: ENTEROSCOPY;  Surgeon: Manus Gunning, MD;  Location: [facility] ENDOSCOPY;  Service: Gastroenterology;   Laterality: N/A;  . ESOPHAGOGASTRODUODENOSCOPY  12/23/2011   Procedure: ESOPHAGOGASTRODUODENOSCOPY (EGD);  Surgeon: Irene Shipper, MD;  Location: [facility];  Service: Endoscopy;  Laterality: N/A;  with small bowel bx's  . GIVENS CAPSULE STUDY  12/28/2011  . GIVENS CAPSULE STUDY N/A 08/14/2016   Procedure: GIVENS CAPSULE STUDY;  Surgeon: Manus Gunning, MD;  Location: [place];  Service: Gastroenterology;  Laterality: N/A;  . KNEE ARTHROSCOPY Right   . LAPAROSCOPIC APPENDECTOMY N/A 02/09/2014   Procedure: APPENDECTOMY LAPAROSCOPIC;  Surgeon: Leighton Ruff, MD;  Location: [facility] ORS;  Service: General;  Laterality: N/A;  . LEFT HEART CATHETERIZATION WITH CORONARY ANGIOGRAM N/A 11/15/2014   Procedure: LEFT HEART CATHETERIZATION WITH CORONARY ANGIOGRAM;  Surgeon: Belva Crome, MD;  Location: [facility] CATH LAB;  Service: Cardiovascular;  Laterality: N/A;  . SHOULDER OPEN ROTATOR CUFF REPAIR Left      Medications: Current Meds  Medication Sig  . allopurinol (ZYLOPRIM) 100 MG tablet Take 2 tablets (274m total) by mouth daily  . amiodarone (PACERONE) 200 MG tablet Take 1 tablet (200 mg total) by mouth daily.  .Marland Kitchenaspirin 81 MG chewable tablet Chew 1 tablet (81 mg total) by mouth daily.  .Marland Kitchenatorvastatin (LIPITOR) 40 MG tablet Take 1 tablet by mouth daily  . b complex vitamins tablet Take 1 tablet by mouth daily.  . Blood Glucose Monitoring Suppl (ONE TOUCH ULTRA 2) W/DEVICE KIT Use as directed Dx E11.9  . Blood Pressure Monitor KIT Use to check blood pressure daily Dx I10  . CARTIA XT 180 MG 24 hr capsule Take 1 capsule by mouth once daily  . Cholecalciferol (VITAMIN D-3) 1000 units CAPS Take 1,000-5,000 Units by mouth See admin instructions. 1,000 units once a day on Sun/Mon/Tues/Wed/Thurs/Fri and 5,000 units on Sat  . collagenase (SANTYL) ointment Apply 1 application topically daily.  . fenofibrate 160 MG tablet Take 1 tablet by mouth daily (Please schedule appointment for future refills)  .  ferrous sulfate 325 (65 FE) MG tablet Take 325 mg by mouth 3 (three) times daily with meals.  . furosemide (LASIX) 40 MG tablet Take 1 tablet (40 mg total) by mouth 2 (two) times daily.  .Marland Kitchengabapentin (NEURONTIN) 100 MG capsule Take 1 capsule by mouth twice a day  . glucose blood (ONETOUCH ULTRA) test strip CHECK BLOOD GLUCOSE (SUGAR) 4 TIMES DAILY DX: E11.9, Z79.4 (pt on insulin)  . hydrALAZINE (APRESOLINE) 50 MG tablet Take 1 tablet (50 mg total) by mouth 3 (three) times daily.  .Marland KitchenHYDROcodone-acetaminophen (NORCO/VICODIN) 5-325 MG tablet Take 1 tablet by mouth every 6 (six) hours as needed for severe pain.  .Marland Kitcheninsulin aspart protamine - aspart (NOVOLOG 70/30 MIX) (70-30) 100 UNIT/ML FlexPen Inject 0.5 mLs (50 Units total) into the skin 2 (two) times daily with a meal.  . Insulin Pen Needle 32G X 4 MM MISC Use to administer insulin  . Lancets (ONETOUCH ULTRASOFT) lancets Use to  check blood sugars daily  . levothyroxine (SYNTHROID) 150 MCG tablet Take 1 tablet (150 mcg total) by mouth daily before breakfast.  . metoprolol tartrate (LOPRESSOR) 25 MG tablet Take 0.5 tablets (12.5 mg total) by mouth 2 (two) times daily.  [name] NEEDLE, DISP, 30 G (BD DISP NEEDLES) 30G X 1/2" MISC USE WITH 1 SYRINGE SUBCUTANEOUSLY  TWICE DAILY  . nitroGLYCERIN (NITROSTAT) 0.4 MG SL tablet Place 1 tablet under tongue for chest pain. Repeat every 5 minutes as needed, up to 3 doses. If no relief after 3 doses, call 911.  . potassium chloride SA (K-DUR,KLOR-CON) 20 MEQ tablet Take 1 tablet by mouth daily  . Secukinumab (COSENTYX) 150 MG/ML SOSY Inject into the skin. Inject 384m (2 pens) subcutaneously every 4 weeks     Allergies: Allergies  Allergen Reactions  . Percocet [Oxycodone-Acetaminophen] Nausea And Vomiting  . Ciprofloxacin Other (See Comments)    Light headed and made stagger  . Metformin And Related Nausea Only    Upset stomach  . Invokana [Canagliflozin] Rash    Social History: The patient  reports that  he quit smoking about 13 years ago. His smoking use included cigarettes. He has a 33.60 pack-year smoking history. He has never used smokeless tobacco. He reports current alcohol use of about 1.0 standard drinks of alcohol per week. He reports that he does not use drugs.   Family History: The patient's family history includes COPD in his sister; Cancer in his brother; Diabetes in his father; Heart attack in his father; Heart disease in his brother; Hypertension in his father and mother; Lung cancer in his father; Stroke in his father and mother.   Review of Systems: Please see the history of present illness.   All other systems are reviewed and negative.   Physical Exam: VS:  BP (!) 178/60   Pulse 67   Ht 5' 11"  (1.803 m)   Wt 266 lb (120.7 kg)   SpO2 97%   BMI 37.10 kg/m  .  BMI Body mass index is 37.1 kg/m.  Wt Readings from Last 3 Encounters:  03/29/19 266 lb (120.7 kg)  02/20/19 262 lb 12.8 oz (119.2 kg)  02/14/19 261 lb (118.4 kg)    General: Elderly. Quite chatty. Obese. Alert and in no acute distress. His weight is up 10 pounds in the last 11 months.  HEENT: Normal.  Neck: Supple, no JVD, carotid bruits, or masses noted.  Cardiac: Regular rate and rhythm. Outflow murmur noted. No edema.  Respiratory:  Lungs are clear to auscultation bilaterally with normal work of breathing.  GI: Soft and nontender.  MS: No deformity or atrophy. Gait and ROM intact.  Skin: Warm and dry. Color is normal.  Neuro:  Strength and sensation are intact and no gross focal deficits noted.  Psych: Alert, appropriate and with normal affect.   LABORATORY DATA:  EKG:  EKG is ordered today. This demonstrates sinus bradycardia - diffuse ST & T wave changes. HR is 59 today.   Lab Results  Component Value Date   WBC 4.0 02/20/2019   HGB 11.2 (L) 02/20/2019   HCT 33.1 (L) 02/20/2019   PLT 181 02/20/2019   GLUCOSE 236 (H) 02/20/2019   CHOL 121 02/01/2019   TRIG 503 (H) 02/01/2019   HDL 23 (L)  02/01/2019   LDLDIRECT 27.5 02/01/2019   LDLCALC UNABLE TO CALCULATE IF TRIGLYCERIDE OVER 400 mg/dL 02/01/2019   ALT 22 01/04/2019   AST 23 01/04/2019   NA 136 02/20/2019  K 5.1 02/20/2019   CL 99 02/20/2019   CREATININE 2.37 (H) 02/20/2019   BUN 35 (H) 02/20/2019   CO2 22 02/20/2019   TSH 2.863 01/31/2019   PSA 0.50 11/07/2013   INR 1.1 01/31/2019   HGBA1C 6.2 01/18/2019   MICROALBUR 1.2 05/16/2013     BNP (last 3 results) Recent Labs    01/04/19 0142  BNP 152.1*    ProBNP (last 3 results) No results for input(s): PROBNP in the last 8760 hours.   Other Studies Reviewed Today:  ECHO IMPRESSIONS 12/2018   1. The left ventricle has normal systolic function with an ejection fraction of 60-65%. The cavity size was normal. There is severely increased left ventricular wall thickness. Left ventricular diastolic Doppler parameters are consistent with  pseudonormalization. Elevated left ventricular end-diastolic pressure The E/e' is 30.  2. The right ventricle has normal systolic function. The cavity was normal. There is no increase in right ventricular wall thickness.  3. Left atrial size was severely dilated.  4. Right atrial size was mildly dilated.  5. The mitral valve is grossly normal. There is mild mitral annular calcification present.  6. The aortic valve was not well visualized. Moderate calcification of the aortic valve. Mild-moderate stenosis of the aortic valve. Mean systolic gradient approximately 26.5 mmHg.  7. The aortic root and ascending aorta are normal in size and structure.  8. The inferior vena cava was dilated in size with >50% respiratory variability.    Coronary Stent Intervention 07/2016  Right/Left Heart Cath and Coronary Angiography  Conclusion    There is mild aortic valve stenosis.   Conclusions: 1. 80%, hazy stenosis involving proximal OM1, most likely representing culprit lesion. 2. Stable appearance of 60% D1 stenosis. 3. Mild to  moderate non-obstructive CAD involving the LAD, AV-groove LCx, and RCA. 4. Mildly elevated left and right heart filling pressures. 5. Mild pulmonary hypertension. 6. Low normal Fick cardiac output. 7. Mild aortic stenosis. 8. Successful PCI to OM1 with placement of a Synergy 2.25 x 38 mm drug-eluting stent (post-dilated with 2.5 mm [place] balloon) with 0% residual stenosis and TIMI-3 flow.  Recommendations: 1. Triple therapy with aspirin, clopidogrel, and apixaban x 1 month, after which time discontinuation of aspirin can be considered. 2. Aggressive secondary prevention. 3. Gentle hydration this afternoon, given CKD and mildly elevated left and right heart pressures. 4. Remove right femoral venous and arterial sheaths 2 hours after discontinuation of bivalirudin.  Nelva Bush, MD [facility] Pager: [phone]     Assessment/Plan:  1. PAF - noted biatrial enlargement on his last echo - may make staying in NSR challenging - sounds like some degree of tachy/brady as well - at some point may need PPM - back on low dose Metoprolol and amiodarone every day now - will need to monitor. F/U DR Lovena Le   2. Hx GI bleed/AVMs/iron deficiency anemia - recent HGB noted - only aspirin.   3. CAD - DES to OM January 2018  ASA   4. HTN - BP is fair. Would follow for now.   5. Diastolic HF - needs good salt restriction.   6. DM - Last A1c in chart 10.0 09/08/18 needs better control f/u primary   7. HLD with marked hypertriglyceridemia - remains on statin/fibrates likely related to poorly controlled  DM  8. CKD - needs to see nephrology baseline Cr 2.4 02/20/19   9. Mild to moderate AS - Echo 01/04/19 mean gradient 27 mmHg peak 46 mmHg f/u echo  June 2021  10. COVID-19 Education: The signs and symptoms of COVID-19 were discussed with the patient and how to seek care for testing (follow up with PCP or arrange E-visit).  The importance of social distancing, staying at home, hand hygiene and  wearing a mask when out in public were discussed today.  Current medicines are reviewed with the patient today.  The patient does not have concerns regarding medicines other than what has been noted above.  The following changes have been made:  See above.  Labs/ tests ordered today include:    No orders of the defined types were placed in this encounter.    Disposition:     Signed: Jenkins Rouge, MD  03/29/2019 10:16 AM  [facility] [address] [place], [place]  [address] Phone: [phone] Fax: [phone]

## 2019-03-20 ENCOUNTER — Telehealth: Payer: Self-pay | Admitting: Cardiovascular Disease

## 2019-03-20 NOTE — Telephone Encounter (Signed)
Pt c/o BP issue: STAT if pt c/o blurred vision, one-sided weakness or slurred speech  1. What are your last 5 BP readings? 131/70 sitting   Last Wednesday       114/70 standing  2. Are you having any other symptoms (ex. Dizziness, headache, blurred vision, passed out)? Dizzy, and lightheaded  3. What is your BP issue? Patient gets lightheaded when he stands. Wife states last week when he went to the kidney doctor the took his BP sitting and standing because he complained of the dizziness going on. Wife didn't have any other BP readings.

## 2019-03-22 NOTE — Telephone Encounter (Signed)
That's fine will evaluate during visit

## 2019-03-22 NOTE — Telephone Encounter (Signed)
Pt's wife notified no changes needed before upcoming office visit. Pt will follow up with Dr. Johnsie Cancel as planned on September 2,2020

## 2019-03-24 ENCOUNTER — Telehealth: Payer: Self-pay | Admitting: Cardiovascular Disease

## 2019-03-24 NOTE — Telephone Encounter (Signed)
Patient's wife called requesting to be able to come with patient to his appt on 9/2. She states he requires her assistance as he gets lightheaded.

## 2019-03-28 NOTE — Telephone Encounter (Signed)
Left message okay for pt's wife to come with pt to appt ./cy

## 2019-03-29 ENCOUNTER — Encounter: Payer: Self-pay | Admitting: Cardiovascular Disease

## 2019-03-29 ENCOUNTER — Ambulatory Visit (INDEPENDENT_AMBULATORY_CARE_PROVIDER_SITE_OTHER): Payer: Medicare Other | Admitting: Cardiovascular Disease

## 2019-03-29 ENCOUNTER — Other Ambulatory Visit: Payer: Self-pay

## 2019-03-29 VITALS — BP 178/60 | HR 67 | Ht 71.0 in | Wt 266.0 lb

## 2019-03-29 DIAGNOSIS — I2510 Atherosclerotic heart disease of native coronary artery without angina pectoris: Secondary | ICD-10-CM | POA: Diagnosis not present

## 2019-03-29 NOTE — Patient Instructions (Signed)
Your physician recommends that you continue on your current medications as directed. Please refer to the Current Medication list given to you today.   Your physician wants you to follow-up in:  6 MONTHS WITH DR NISHAN  You will receive a reminder letter in the mail two months in advance. If you don't receive a letter, please call our office to schedule the follow-up appointment. 

## 2019-04-20 ENCOUNTER — Encounter: Payer: Self-pay | Admitting: Internal Medicine

## 2019-04-20 ENCOUNTER — Ambulatory Visit (INDEPENDENT_AMBULATORY_CARE_PROVIDER_SITE_OTHER): Payer: Medicare Other | Admitting: Internal Medicine

## 2019-04-20 ENCOUNTER — Other Ambulatory Visit (INDEPENDENT_AMBULATORY_CARE_PROVIDER_SITE_OTHER): Payer: Medicare Other

## 2019-04-20 ENCOUNTER — Other Ambulatory Visit: Payer: Self-pay

## 2019-04-20 DIAGNOSIS — E1140 Type 2 diabetes mellitus with diabetic neuropathy, unspecified: Secondary | ICD-10-CM

## 2019-04-20 DIAGNOSIS — E785 Hyperlipidemia, unspecified: Secondary | ICD-10-CM | POA: Diagnosis not present

## 2019-04-20 DIAGNOSIS — E1151 Type 2 diabetes mellitus with diabetic peripheral angiopathy without gangrene: Secondary | ICD-10-CM

## 2019-04-20 DIAGNOSIS — R42 Dizziness and giddiness: Secondary | ICD-10-CM | POA: Diagnosis not present

## 2019-04-20 DIAGNOSIS — I2510 Atherosclerotic heart disease of native coronary artery without angina pectoris: Secondary | ICD-10-CM

## 2019-04-20 DIAGNOSIS — I10 Essential (primary) hypertension: Secondary | ICD-10-CM | POA: Diagnosis not present

## 2019-04-20 LAB — BASIC METABOLIC PANEL
BUN: 36 mg/dL — ABNORMAL HIGH (ref 6–23)
CO2: 26 mEq/L (ref 19–32)
Calcium: 8.8 mg/dL (ref 8.4–10.5)
Chloride: 103 mEq/L (ref 96–112)
Creatinine, Ser: 1.78 mg/dL — ABNORMAL HIGH (ref 0.40–1.50)
GFR: 37.29 mL/min — ABNORMAL LOW (ref >60.00)
Glucose, Bld: 280 mg/dL — ABNORMAL HIGH (ref 70–99)
Potassium: 4.5 mEq/L (ref 3.5–5.1)
Sodium: 137 mEq/L (ref 135–145)

## 2019-04-20 LAB — HEMOGLOBIN A1C: Hgb A1c MFr Bld: 5.9 % (ref 4.6–6.5)

## 2019-04-20 MED ORDER — GABAPENTIN 300 MG PO CAPS: 300.0000 mg | ORAL_CAPSULE | Freq: Three times a day (TID) | ORAL | 3 refills | Status: AC

## 2019-04-20 MED ORDER — TRIAMCINOLONE ACETONIDE 0.1 % EX OINT: 1.0000 "application " | TOPICAL_OINTMENT | Freq: Three times a day (TID) | CUTANEOUS | 2 refills | Status: DC

## 2019-04-20 NOTE — Patient Instructions (Addendum)
If you have medicare related insurance (such as traditional Medicare, [employer], [employer], or similar), Please make an appointment at the scheduling desk with Sharee Pimple, the [employer], for your Wellness visit in this office, which is a benefit with your insurance.   Eat oatmeal without sugar

## 2019-04-20 NOTE — Progress Notes (Signed)
Subjective:  Patient ID: Michael [name], male    DOB: [birth year]  Age: 78 y.o. MRN: [id number]  CC: No chief complaint on file.   HPI Michael [name] presents for HTN, arthritis, LBP C/o lightheadedness when standing up x 2-3 month after he was started on Metoprolol  Outpatient Medications Prior to Visit  Medication Sig Dispense Refill  . allopurinol (ZYLOPRIM) 100 MG tablet Take 2 tablets (225m total) by mouth daily 60 tablet 5  . amiodarone (PACERONE) 200 MG tablet Take 1 tablet (200 mg total) by mouth daily.    .Marland Kitchenaspirin 81 MG chewable tablet Chew 1 tablet (81 mg total) by mouth daily. 90 tablet 1  . atorvastatin (LIPITOR) 40 MG tablet Take 1 tablet by mouth daily 90 tablet 3  . b complex vitamins tablet Take 1 tablet by mouth daily. 100 tablet 3  . Blood Glucose Monitoring Suppl (ONE TOUCH ULTRA 2) W/DEVICE KIT Use as directed Dx E11.9 1 each 0  . Blood Pressure Monitor KIT Use to check blood pressure daily Dx I10 1 each 0  . CARTIA XT 180 MG 24 hr capsule Take 1 capsule by mouth once daily 90 capsule 0  . Cholecalciferol (VITAMIN D-3) 1000 units CAPS Take 1,000-5,000 Units by mouth See admin instructions. 1,000 units once a day on Sun/Mon/Tues/Wed/Thurs/Fri and 5,000 units on Sat    . collagenase (SANTYL) ointment Apply 1 application topically daily. 15 g 2  . fenofibrate 160 MG tablet Take 1 tablet by mouth daily (Please schedule appointment for future refills) 90 tablet 1  . ferrous sulfate 325 (65 FE) MG tablet Take 325 mg by mouth 3 (three) times daily with meals.    . furosemide (LASIX) 40 MG tablet Take 1 tablet (40 mg total) by mouth 2 (two) times daily. 30 tablet   . gabapentin (NEURONTIN) 100 MG capsule Take 1 capsule by mouth twice a day 180 capsule 3  . glucose blood (ONETOUCH ULTRA) test strip CHECK BLOOD GLUCOSE (SUGAR) 4 TIMES DAILY DX: E11.9, Z79.4 (pt on insulin) 400 each 3  . hydrALAZINE (APRESOLINE) 50 MG tablet Take 1 tablet (50 mg total) by mouth 3 (three) times  daily. 90 tablet 0  . HYDROcodone-acetaminophen (NORCO/VICODIN) 5-325 MG tablet Take 1 tablet by mouth every 6 (six) hours as needed for severe pain. 20 tablet 0  . insulin aspart protamine - aspart (NOVOLOG 70/30 MIX) (70-30) 100 UNIT/ML FlexPen Inject 0.5 mLs (50 Units total) into the skin 2 (two) times daily with a meal. 30 mL 11  . Insulin Pen Needle 32G X 4 MM MISC Use to administer insulin 100 each 11  . Lancets (ONETOUCH ULTRASOFT) lancets Use to check blood sugars daily 30 each 11  . levothyroxine (SYNTHROID) 150 MCG tablet Take 1 tablet (150 mcg total) by mouth daily before breakfast. 90 tablet 3  . metoprolol tartrate (LOPRESSOR) 25 MG tablet Take 0.5 tablets (12.5 mg total) by mouth 2 (two) times daily. 30 tablet 6  . NEEDLE, DISP, 30 G (BD DISP NEEDLES) 30G X 1/2" MISC USE WITH 1 SYRINGE SUBCUTANEOUSLY  TWICE DAILY 100 each 11  . nitroGLYCERIN (NITROSTAT) 0.4 MG SL tablet Place 1 tablet under tongue for chest pain. Repeat every 5 minutes as needed, up to 3 doses. If no relief after 3 doses, call 911. 25 tablet 11  . potassium chloride SA (K-DUR,KLOR-CON) 20 MEQ tablet Take 1 tablet by mouth daily 90 tablet 0  . Secukinumab (COSENTYX) 150 MG/ML SOSY Inject into the  skin. Inject 320m (2 pens) subcutaneously every 4 weeks     No facility-administered medications prior to visit.     ROS: Review of Systems  Constitutional: Positive for fatigue. Negative for appetite change and unexpected weight change.  HENT: Negative for congestion, nosebleeds, sneezing, sore throat and trouble swallowing.   Eyes: Negative for itching and visual disturbance.  Respiratory: Negative for cough.   Cardiovascular: Negative for chest pain, palpitations and leg swelling.  Gastrointestinal: Negative for abdominal distention, blood in stool, diarrhea and nausea.  Genitourinary: Negative for frequency and hematuria.  Musculoskeletal: Positive for arthralgias, back pain, gait problem, joint swelling and neck  stiffness. Negative for neck pain.  Skin: Negative for rash.  Neurological: Positive for dizziness, weakness and light-headedness. Negative for tremors and speech difficulty.  Psychiatric/Behavioral: Negative for agitation, dysphoric mood, sleep disturbance and suicidal ideas. The patient is not nervous/anxious.     Objective:  BP (!) 176/72 (BP Location: Left Arm, Patient Position: Sitting, Cuff Size: Large)   Pulse 65   Temp 98.2 F (36.8 C) (Oral)   Ht 5' 11"  (1.803 m)   Wt 268 lb (121.6 kg)   SpO2 95%   BMI 37.38 kg/m   BP Readings from Last 3 Encounters:  04/20/19 (!) 176/72  03/29/19 (!) 178/60  02/20/19 (!) 142/78    Wt Readings from Last 3 Encounters:  04/20/19 268 lb (121.6 kg)  03/29/19 266 lb (120.7 kg)  02/20/19 262 lb 12.8 oz (119.2 kg)    Physical Exam Constitutional:      General: He is not in acute distress.    Appearance: He is well-developed.     Comments: NAD  Eyes:     Conjunctiva/sclera: Conjunctivae normal.     Pupils: Pupils are equal, round, and reactive to light.  Neck:     Musculoskeletal: Normal range of motion.     Thyroid: No thyromegaly.     Vascular: No JVD.  Cardiovascular:     Rate and Rhythm: Normal rate and regular rhythm.     Heart sounds: Normal heart sounds. No murmur. No friction rub. No gallop.   Pulmonary:     Effort: Pulmonary effort is normal. No respiratory distress.     Breath sounds: Normal breath sounds. No wheezing or rales.  Chest:     Chest wall: No tenderness.  Abdominal:     General: Bowel sounds are normal. There is no distension.     Palpations: Abdomen is soft. There is no mass.     Tenderness: There is no abdominal tenderness. There is no guarding or rebound.  Musculoskeletal: Normal range of motion.        General: Tenderness and deformity present.     Right lower leg: Edema present.     Left lower leg: Edema present.  Lymphadenopathy:     Cervical: No cervical adenopathy.  Skin:    General: Skin is  warm and dry.     Findings: Rash present.  Neurological:     Mental Status: He is alert and oriented to person, place, and time.     Cranial Nerves: No cranial nerve deficit.     Motor: No abnormal muscle tone.     Coordination: Coordination abnormal.     Gait: Gait abnormal.     Deep Tendon Reflexes: Reflexes are normal and symmetric.  Psychiatric:        Behavior: Behavior normal.        Thought Content: Thought content normal.  Judgment: Judgment normal.   trace edema Rash on legs  Lab Results  Component Value Date   WBC 4.0 02/20/2019   HGB 11.2 (L) 02/20/2019   HCT 33.1 (L) 02/20/2019   PLT 181 02/20/2019   GLUCOSE 236 (H) 02/20/2019   CHOL 121 02/01/2019   TRIG 503 (H) 02/01/2019   HDL 23 (L) 02/01/2019   LDLDIRECT 27.5 02/01/2019   LDLCALC UNABLE TO CALCULATE IF TRIGLYCERIDE OVER 400 mg/dL 02/01/2019   ALT 22 01/04/2019   AST 23 01/04/2019   NA 136 02/20/2019   K 5.1 02/20/2019   CL 99 02/20/2019   CREATININE 2.37 (H) 02/20/2019   BUN 35 (H) 02/20/2019   CO2 22 02/20/2019   TSH 2.863 01/31/2019   PSA 0.50 11/07/2013   INR 1.1 01/31/2019   HGBA1C 6.2 01/18/2019   MICROALBUR 1.2 05/16/2013    Dg Chest 2 View  Result Date: 01/31/2019 CLINICAL DATA:  Mild chest tightness, shortness of breath and fatigue, atrial fibrillation, CHF, diabetes mellitus, coronary artery disease post MI, hypertension EXAM: CHEST - 2 VIEW COMPARISON:  01/04/2019, 12/28/2017 FINDINGS: Upper normal heart size. Mediastinal contours and pulmonary vascularity normal. Atherosclerotic calcification aorta. Lungs clear. Chronic blunting of the LEFT lateral costophrenic angle by either pleural fluid or thickening. On lateral view, chronic pleural based opacity is posteriorly, likely at the posterior lower LEFT lung, question loculated pleural effusion; this is unchanged since 12/28/2017. Bones unremarkable. IMPRESSION: Chronic LEFT pleural effusion versus pleural thickening at the LEFT base, and  likely loculated at the posterior lower LEFT chest, unchanged since 12/28/2017. No definite acute abnormalities. Electronically Signed   By: Lavonia Dana M.D.   On: 01/31/2019 11:53    Assessment & Plan:   There are no diagnoses linked to this encounter.   No orders of the defined types were placed in this encounter.    Follow-up: No follow-ups on file.  [name], MD

## 2019-04-20 NOTE — Assessment & Plan Note (Addendum)
lightheadedness when standing up x 2-3 month after he was started on Metoprolol Reduce metoprolol to 1/2 at night or stay on bid Hydrate well

## 2019-04-20 NOTE — Assessment & Plan Note (Addendum)
Labs CBGs 200's: compliance w/diet, meds discussed Eat oatmeal without sugar

## 2019-04-20 NOTE — Assessment & Plan Note (Signed)
Lipitor 

## 2019-05-02 ENCOUNTER — Telehealth: Payer: Self-pay | Admitting: *Deleted

## 2019-05-02 NOTE — Telephone Encounter (Signed)
I called pt's wife- she states his Novolog 70/30 flexpen is $300 a month. Michael [name] uses Novolin flexpen 70/30, which only costs her $40/month.  She states Michael [name] has been using her pen for 4-5 days 50 units bid and his morning CBGs have been 90-150. She is requesting his rx be changed to the same prescription as hers. Please advise.

## 2019-05-02 NOTE — Telephone Encounter (Signed)
Copied from [place] [id number]. Topic: General - Inquiry >> May 02, 2019 11:59 AM Mathis Bud wrote: Reason for CRM: Patient wife is requesting a call back from nurse regarding patients medication.   Call back [phone]

## 2019-05-03 MED ORDER — NOVOLIN 70/30 RELION (70-30) 100 UNIT/ML ~~LOC~~ SUSP: 50.0000 [IU] | Freq: Two times a day (BID) | SUBCUTANEOUS | 11 refills | Status: DC

## 2019-05-03 NOTE — Telephone Encounter (Signed)
Updated Rx sent. Patient's wife informed.

## 2019-05-03 NOTE — Telephone Encounter (Signed)
Ok to correct as she requested Thx

## 2019-05-09 ENCOUNTER — Other Ambulatory Visit: Payer: Self-pay | Admitting: Internal Medicine

## 2019-05-09 MED ORDER — ALLOPURINOL 100 MG PO TABS: ORAL_TABLET | ORAL | 5 refills | Status: AC

## 2019-05-09 NOTE — Telephone Encounter (Signed)
Medication Refill - Medication: amiodarone (PACERONE) 200 MG tablet, allopurinol (ZYLOPRIM) 100 MG tablet  Has the patient contacted their pharmacy? Yes.   (Agent: If no, request that the patient contact the pharmacy for the refill.) (Agent: If yes, when and what did the pharmacy advise?)  Preferred Pharmacy (with phone number or street name): [facility], [place]  Agent: Please be advised that RX refills may take up to 3 business days. We ask that you follow-up with your pharmacy.

## 2019-05-10 MED ORDER — NOVOLIN 70/30 FLEXPEN RELION (70-30) 100 UNIT/ML ~~LOC~~ SUPN: PEN_INJECTOR | SUBCUTANEOUS | 11 refills | Status: AC

## 2019-05-10 NOTE — Telephone Encounter (Signed)
Spouse has called.  Please follow back up.

## 2019-05-10 NOTE — Telephone Encounter (Signed)
Pt wanted pens sent in instead of the vile's. RX sent

## 2019-05-10 NOTE — Telephone Encounter (Signed)
Patient's wife called speak with the nurse regarding patient's refill request.  CB# [phone]

## 2019-05-10 NOTE — Telephone Encounter (Signed)
lmtcb

## 2019-05-10 NOTE — Addendum Note (Signed)
Addended by: Karren Cobble on: 05/10/2019 03:59 PM   Modules accepted: Orders

## 2019-05-29 ENCOUNTER — Encounter: Payer: Self-pay | Admitting: Internal Medicine

## 2019-05-29 ENCOUNTER — Other Ambulatory Visit: Payer: Self-pay

## 2019-05-29 ENCOUNTER — Ambulatory Visit (INDEPENDENT_AMBULATORY_CARE_PROVIDER_SITE_OTHER): Payer: Medicare Other | Admitting: Internal Medicine

## 2019-05-29 DIAGNOSIS — E1151 Type 2 diabetes mellitus with diabetic peripheral angiopathy without gangrene: Secondary | ICD-10-CM

## 2019-05-29 DIAGNOSIS — L409 Psoriasis, unspecified: Secondary | ICD-10-CM | POA: Diagnosis not present

## 2019-05-29 DIAGNOSIS — R21 Rash and other nonspecific skin eruption: Secondary | ICD-10-CM

## 2019-05-29 DIAGNOSIS — I2510 Atherosclerotic heart disease of native coronary artery without angina pectoris: Secondary | ICD-10-CM | POA: Diagnosis not present

## 2019-05-29 MED ORDER — METHYLPREDNISOLONE 4 MG PO TBPK: ORAL_TABLET | ORAL | 0 refills | Status: DC

## 2019-05-29 MED ORDER — METHYLPREDNISOLONE ACETATE 80 MG/ML IJ SUSP
80.0000 mg | Freq: Once | INTRAMUSCULAR | Status: AC
  Administered 2019-05-29: 80 mg via INTRAMUSCULAR

## 2019-05-29 MED ORDER — TRIAMCINOLONE ACETONIDE 0.1 % EX OINT: 1.0000 "application " | TOPICAL_OINTMENT | Freq: Three times a day (TID) | CUTANEOUS | 2 refills | Status: DC

## 2019-05-29 NOTE — Assessment & Plan Note (Signed)
F/u w/Dr Denna Haggard - on Cosentyx

## 2019-05-29 NOTE — Assessment & Plan Note (Signed)
Use more Novolog while on Prednisone

## 2019-05-29 NOTE — Progress Notes (Signed)
Subjective:  Patient ID: Michael [name], male    DOB: Jun 01, 1941  Age: 78 y.o. MRN: [id number]  CC: No chief complaint on file.   HPI [name] presents for rash on B legs below knees x 3 months Stinging pain, itching Derm - Dr Denna Haggard - on Cosentyx for psoriasis  Outpatient Medications Prior to Visit  Medication Sig Dispense Refill  . allopurinol (ZYLOPRIM) 100 MG tablet Take 2 tablets (221m total) by mouth daily 60 tablet 5  . amiodarone (PACERONE) 200 MG tablet TAKE 1 TABLET BY MOUTH ONCE DAILY EXCEPT  FOR  SUNDAY 90 tablet 3  . aspirin 81 MG chewable tablet Chew 1 tablet (81 mg total) by mouth daily. 90 tablet 1  . atorvastatin (LIPITOR) 40 MG tablet Take 1 tablet by mouth daily 90 tablet 2  . b complex vitamins tablet Take 1 tablet by mouth daily. 100 tablet 3  . Blood Glucose Monitoring Suppl (ONE TOUCH ULTRA 2) W/DEVICE KIT Use as directed Dx E11.9 1 each 0  . Blood Pressure Monitor KIT Use to check blood pressure daily Dx I10 1 each 0  . CARTIA XT 180 MG 24 hr capsule Take 1 capsule by mouth once daily 90 capsule 0  . Cholecalciferol (VITAMIN D-3) 1000 units CAPS Take 1,000-5,000 Units by mouth See admin instructions. 1,000 units once a day on Sun/Mon/Tues/Wed/Thurs/Fri and 5,000 units on Sat    . collagenase (SANTYL) ointment Apply 1 application topically daily. 15 g 2  . fenofibrate 160 MG tablet Take 1 tablet by mouth daily (Please schedule appointment for future refills) 90 tablet 1  . ferrous sulfate 325 (65 FE) MG tablet Take 325 mg by mouth 3 (three) times daily with meals.    . furosemide (LASIX) 40 MG tablet Take 1 tablet (40 mg total) by mouth 2 (two) times daily. 30 tablet   . gabapentin (NEURONTIN) 300 MG capsule Take 1 capsule (300 mg total) by mouth 3 (three) times daily. 270 capsule 3  . glucose blood (ONETOUCH ULTRA) test strip CHECK BLOOD GLUCOSE (SUGAR) 4 TIMES DAILY DX: E11.9, Z79.4 (pt on insulin) 400 each 3  . hydrALAZINE (APRESOLINE) 50 MG tablet Take  1 tablet (50 mg total) by mouth 3 (three) times daily. 90 tablet 0  . HYDROcodone-acetaminophen (NORCO/VICODIN) 5-325 MG tablet Take 1 tablet by mouth every 6 (six) hours as needed for severe pain. 20 tablet 0  . Insulin Isophane & Regular Human (NOVOLIN 70/30 FLEXPEN RELION) (70-30) 100 UNIT/ML PEN Inject 50 Units into the skin 2 (two) times daily with a meal. 30 mL 11  . Insulin Pen Needle 32G X 4 MM MISC Use to administer insulin 100 each 11  . Lancets (ONETOUCH ULTRASOFT) lancets Use to check blood sugars daily 30 each 11  . levothyroxine (SYNTHROID) 150 MCG tablet Take 1 tablet (150 mcg total) by mouth daily before breakfast. 90 tablet 3  . metoprolol tartrate (LOPRESSOR) 25 MG tablet Take 0.5 tablets (12.5 mg total) by mouth 2 (two) times daily. 30 tablet 6  . NEEDLE, DISP, 30 G (BD DISP NEEDLES) 30G X 1/2" MISC USE WITH 1 SYRINGE SUBCUTANEOUSLY  TWICE DAILY 100 each 11  . nitroGLYCERIN (NITROSTAT) 0.4 MG SL tablet Place 1 tablet under tongue for chest pain. Repeat every 5 minutes as needed, up to 3 doses. If no relief after 3 doses, call 911. 25 tablet 11  . potassium chloride SA (K-DUR,KLOR-CON) 20 MEQ tablet Take 1 tablet by mouth daily 90 tablet 0  .  Secukinumab (COSENTYX) 150 MG/ML SOSY Inject into the skin. Inject 365m (2 pens) subcutaneously every 4 weeks    . triamcinolone ointment (KENALOG) 0.1 % Apply 1 application topically 3 (three) times daily. On leg rash 80 g 2   No facility-administered medications prior to visit.     ROS: Review of Systems  Constitutional: Negative for appetite change, fatigue and unexpected weight change.  HENT: Negative for congestion, nosebleeds, sneezing, sore throat and trouble swallowing.   Eyes: Negative for itching and visual disturbance.  Respiratory: Negative for cough.   Cardiovascular: Positive for leg swelling. Negative for chest pain and palpitations.  Gastrointestinal: Negative for abdominal distention, blood in stool, diarrhea and  nausea.  Genitourinary: Negative for frequency and hematuria.  Musculoskeletal: Positive for arthralgias, back pain and gait problem. Negative for joint swelling and neck pain.  Skin: Positive for rash.  Neurological: Negative for dizziness, tremors, speech difficulty and weakness.  Psychiatric/Behavioral: Negative for agitation, dysphoric mood and sleep disturbance. The patient is not nervous/anxious.     Objective:  BP (!) 164/72 (BP Location: Left Arm, Patient Position: Sitting, Cuff Size: Large)   Pulse 65   Temp 98.2 F (36.8 C) (Oral)   Ht _0  (1.803 m)   Wt 261 lb (118.4 kg)   SpO2 95%   BMI 36.40 kg/m   BP Readings from Last 3 Encounters:  05/29/19 (!) 164/72  04/20/19 (!) 176/72  03/29/19 (!) 178/60    Wt Readings from Last 3 Encounters:  05/29/19 261 lb (118.4 kg)  04/20/19 268 lb (121.6 kg)  03/29/19 266 lb (120.7 kg)    Physical Exam Constitutional:      General: He is not in acute distress.    Appearance: He is well-developed. He is obese.     Comments: NAD  Eyes:     Conjunctiva/sclera: Conjunctivae normal.     Pupils: Pupils are equal, round, and reactive to light.  Neck:     Musculoskeletal: Normal range of motion.     Thyroid: No thyromegaly.     Vascular: No JVD.  Cardiovascular:     Rate and Rhythm: Normal rate and regular rhythm.     Heart sounds: Normal heart sounds. No murmur. No friction rub. No gallop.   Pulmonary:     Effort: Pulmonary effort is normal. No respiratory distress.     Breath sounds: Normal breath sounds. No wheezing or rales.  Chest:     Chest wall: No tenderness.  Abdominal:     General: Bowel sounds are normal. There is no distension.     Palpations: Abdomen is soft. There is no mass.     Tenderness: There is no abdominal tenderness. There is no guarding or rebound.  Musculoskeletal: Normal range of motion.        General: Tenderness and deformity present.  Lymphadenopathy:     Cervical: No cervical adenopathy.   Skin:    General: Skin is warm and dry.     Findings: Rash present.  Neurological:     General: No focal deficit present.     Mental Status: He is alert and oriented to person, place, and time.     Cranial Nerves: No cranial nerve deficit.     Motor: No abnormal muscle tone.     Coordination: Coordination normal.     Gait: Gait normal.     Deep Tendon Reflexes: Reflexes are normal and symmetric.  Psychiatric:        Behavior: Behavior normal.  Thought Content: Thought content normal.        Judgment: Judgment normal.    Nodules, blisters on B arms, but mostly on the lower legs   Lab Results  Component Value Date   WBC 4.0 02/20/2019   HGB 11.2 (L) 02/20/2019   HCT 33.1 (L) 02/20/2019   PLT 181 02/20/2019   GLUCOSE 280 (H) 04/20/2019   CHOL 121 02/01/2019   TRIG 503 (H) 02/01/2019   HDL 23 (L) 02/01/2019   LDLDIRECT 27.5 02/01/2019   LDLCALC UNABLE TO CALCULATE IF TRIGLYCERIDE OVER 400 mg/dL 02/01/2019   ALT 22 01/04/2019   AST 23 01/04/2019   NA 137 04/20/2019   K 4.5 04/20/2019   CL 103 04/20/2019   CREATININE 1.78 (H) 04/20/2019   BUN 36 (H) 04/20/2019   CO2 26 04/20/2019   TSH 2.863 01/31/2019   PSA 0.50 11/07/2013   INR 1.1 01/31/2019   HGBA1C 5.9 04/20/2019   MICROALBUR 1.2 05/16/2013    Dg Chest 2 View  Result Date: 01/31/2019 CLINICAL DATA:  Mild chest tightness, shortness of breath and fatigue, atrial fibrillation, CHF, diabetes mellitus, coronary artery disease post MI, hypertension EXAM: CHEST - 2 VIEW COMPARISON:  01/04/2019, 12/28/2017 FINDINGS: Upper normal heart size. Mediastinal contours and pulmonary vascularity normal. Atherosclerotic calcification aorta. Lungs clear. Chronic blunting of the LEFT lateral costophrenic angle by either pleural fluid or thickening. On lateral view, chronic pleural based opacity is posteriorly, likely at the posterior lower LEFT lung, question loculated pleural effusion; this is unchanged since 12/28/2017. Bones  unremarkable. IMPRESSION: Chronic LEFT pleural effusion versus pleural thickening at the LEFT base, and likely loculated at the posterior lower LEFT chest, unchanged since 12/28/2017. No definite acute abnormalities. Electronically Signed   By: Lavonia Dana M.D.   On: 01/31/2019 11:53    Assessment & Plan:   There are no diagnoses linked to this encounter.   No orders of the defined types were placed in this encounter.    Follow-up: No follow-ups on file.  [name], MD

## 2019-05-29 NOTE — Assessment & Plan Note (Signed)
See Dr Denna Haggard - on Cosentyx for psoriasis B leg dermatitis ?etiology: Triamc oint Depomedrol 80 mg IM Medrol pac

## 2019-06-04 ENCOUNTER — Encounter: Payer: Self-pay | Admitting: Internal Medicine

## 2019-06-06 ENCOUNTER — Other Ambulatory Visit: Payer: Self-pay | Admitting: Cardiovascular Disease

## 2019-06-06 ENCOUNTER — Other Ambulatory Visit: Payer: Self-pay | Admitting: *Deleted

## 2019-06-06 MED ORDER — FUROSEMIDE 40 MG PO TABS: 40.0000 mg | ORAL_TABLET | Freq: Two times a day (BID) | ORAL | 2 refills | Status: AC

## 2019-06-08 DIAGNOSIS — N184 Chronic kidney disease, stage 4 (severe): Secondary | ICD-10-CM | POA: Diagnosis not present

## 2019-06-13 DIAGNOSIS — E1122 Type 2 diabetes mellitus with diabetic chronic kidney disease: Secondary | ICD-10-CM | POA: Diagnosis not present

## 2019-06-13 DIAGNOSIS — I129 Hypertensive chronic kidney disease with stage 1 through stage 4 chronic kidney disease, or unspecified chronic kidney disease: Secondary | ICD-10-CM | POA: Diagnosis not present

## 2019-06-13 DIAGNOSIS — R809 Proteinuria, unspecified: Secondary | ICD-10-CM | POA: Diagnosis not present

## 2019-06-13 DIAGNOSIS — I2510 Atherosclerotic heart disease of native coronary artery without angina pectoris: Secondary | ICD-10-CM | POA: Diagnosis not present

## 2019-06-13 DIAGNOSIS — N184 Chronic kidney disease, stage 4 (severe): Secondary | ICD-10-CM | POA: Diagnosis not present

## 2019-06-15 ENCOUNTER — Other Ambulatory Visit: Payer: Self-pay | Admitting: Internal Medicine

## 2019-06-15 ENCOUNTER — Other Ambulatory Visit: Payer: Self-pay | Admitting: Cardiovascular Disease

## 2019-06-15 MED ORDER — FENOFIBRATE 160 MG PO TABS: ORAL_TABLET | ORAL | 9 refills | Status: AC

## 2019-06-15 MED ORDER — LEVOTHYROXINE SODIUM 150 MCG PO TABS: 150.0000 ug | ORAL_TABLET | Freq: Every day | ORAL | 3 refills | Status: AC

## 2019-06-15 NOTE — Telephone Encounter (Signed)
Medication Refill - Medication: levothyroxine (SYNTHROID) 150 MCG tablet [[id number]]      Preferred Pharmacy (with phone number or street name):  [facility], [address] [place]  [place] [address]  Phone: [phone] Fax: [phone]     Agent: Please be advised that RX refills may take up to 3 business days. We ask that you follow-up with your pharmacy.    Patient Wife called in all medications should be sent to Pharmacy above .

## 2019-06-15 NOTE — Telephone Encounter (Signed)
Pt's medication was sent to pt's pharmacy as requested. Confirmation received.  °

## 2019-06-21 DIAGNOSIS — N184 Chronic kidney disease, stage 4 (severe): Secondary | ICD-10-CM | POA: Diagnosis not present

## 2019-06-21 DIAGNOSIS — I129 Hypertensive chronic kidney disease with stage 1 through stage 4 chronic kidney disease, or unspecified chronic kidney disease: Secondary | ICD-10-CM | POA: Diagnosis not present

## 2019-06-26 ENCOUNTER — Emergency Department (HOSPITAL_COMMUNITY)
Admission: EM | Admit: 2019-06-26 | Discharge: 2019-06-27 | Disposition: A | Discharge status: Home / Self Care | Payer: Medicare Other | Attending: Emergency Medicine | Admitting: Emergency Medicine

## 2019-06-26 ENCOUNTER — Encounter (HOSPITAL_COMMUNITY): Payer: Self-pay | Admitting: Emergency Medicine

## 2019-06-26 ENCOUNTER — Other Ambulatory Visit: Payer: Self-pay

## 2019-06-26 DIAGNOSIS — I130 Hypertensive heart and chronic kidney disease with heart failure and stage 1 through stage 4 chronic kidney disease, or unspecified chronic kidney disease: Secondary | ICD-10-CM | POA: Diagnosis not present

## 2019-06-26 DIAGNOSIS — I252 Old myocardial infarction: Secondary | ICD-10-CM | POA: Diagnosis not present

## 2019-06-26 DIAGNOSIS — Z7982 Long term (current) use of aspirin: Secondary | ICD-10-CM | POA: Diagnosis not present

## 2019-06-26 DIAGNOSIS — Z79899 Other long term (current) drug therapy: Secondary | ICD-10-CM | POA: Insufficient documentation

## 2019-06-26 DIAGNOSIS — R21 Rash and other nonspecific skin eruption: Secondary | ICD-10-CM | POA: Insufficient documentation

## 2019-06-26 DIAGNOSIS — E1122 Type 2 diabetes mellitus with diabetic chronic kidney disease: Secondary | ICD-10-CM | POA: Insufficient documentation

## 2019-06-26 DIAGNOSIS — E039 Hypothyroidism, unspecified: Secondary | ICD-10-CM | POA: Insufficient documentation

## 2019-06-26 DIAGNOSIS — R2243 Localized swelling, mass and lump, lower limb, bilateral: Secondary | ICD-10-CM | POA: Diagnosis not present

## 2019-06-26 DIAGNOSIS — Z794 Long term (current) use of insulin: Secondary | ICD-10-CM | POA: Diagnosis not present

## 2019-06-26 DIAGNOSIS — L739 Follicular disorder, unspecified: Secondary | ICD-10-CM | POA: Insufficient documentation

## 2019-06-26 DIAGNOSIS — M7989 Other specified soft tissue disorders: Secondary | ICD-10-CM | POA: Diagnosis not present

## 2019-06-26 DIAGNOSIS — N183 Chronic kidney disease, stage 3 unspecified: Secondary | ICD-10-CM | POA: Diagnosis not present

## 2019-06-26 DIAGNOSIS — Z87891 Personal history of nicotine dependence: Secondary | ICD-10-CM | POA: Diagnosis not present

## 2019-06-26 DIAGNOSIS — I5032 Chronic diastolic (congestive) heart failure: Secondary | ICD-10-CM | POA: Insufficient documentation

## 2019-06-26 NOTE — ED Triage Notes (Signed)
Pt having bilat leg swelling, pains, and sores since August. PCP referred to dermatologist but not for a few more weeks and pain is too bad to wait.

## 2019-06-27 DIAGNOSIS — R2243 Localized swelling, mass and lump, lower limb, bilateral: Secondary | ICD-10-CM | POA: Diagnosis not present

## 2019-06-27 LAB — I-STAT CHEM 8, ED
BUN: 49 mg/dL — ABNORMAL HIGH (ref 8–23)
Calcium, Ion: 1.18 mmol/L (ref 1.15–1.40)
Chloride: 107 mmol/L (ref 98–111)
Creatinine, Ser: 2.6 mg/dL — ABNORMAL HIGH (ref 0.61–1.24)
Glucose, Bld: 73 mg/dL (ref 70–99)
HCT: 27 % — ABNORMAL LOW (ref 39.0–52.0)
Hemoglobin: 9.2 g/dL — ABNORMAL LOW (ref 13.0–17.0)
Potassium: 4.1 mmol/L (ref 3.5–5.1)
Sodium: 143 mmol/L (ref 135–145)
TCO2: 24 mmol/L (ref 22–32)

## 2019-06-27 MED ORDER — HYDROCODONE-ACETAMINOPHEN 5-325 MG PO TABS: 1.0000 | ORAL_TABLET | ORAL | 0 refills | Status: DC | PRN

## 2019-06-27 MED ORDER — DOXYCYCLINE HYCLATE 100 MG PO CAPS: 100.0000 mg | ORAL_CAPSULE | Freq: Two times a day (BID) | ORAL | 0 refills | Status: DC

## 2019-06-27 MED ORDER — TRAMADOL HCL 50 MG PO TABS
50.0000 mg | ORAL_TABLET | Freq: Once | ORAL | Status: AC
  Administered 2019-06-27: 50 mg via ORAL
  Filled 2019-06-27: qty 1

## 2019-06-27 MED ORDER — DOXYCYCLINE HYCLATE 100 MG PO TABS
100.0000 mg | ORAL_TABLET | Freq: Once | ORAL | Status: AC
  Administered 2019-06-27: 100 mg via ORAL
  Filled 2019-06-27: qty 1

## 2019-06-27 NOTE — Discharge Instructions (Addendum)
Take 2 Lasix tablets twice a day (double the normal dose) for the next 2 days.  Start taking the antibiotic.  Rest and elevate your legs as much as possible.  Follow-up with the dermatologist as scheduled, return if symptoms worsen.

## 2019-06-27 NOTE — ED Provider Notes (Signed)
[facility] DEPT Provider Note   CSN: [id number] Arrival date & time: 06/26/19  1741     History   Chief Complaint Chief Complaint  Patient presents with  . Leg Swelling  . sores on legs  . Leg Pain    HPI Michael [name] is a 78 y.o. male.     Patient presents to the emergency department for evaluation of leg pain and skin changes.  Patient has had a rash on his lower leg since August.  He has seen his primary doctor several times, has been on prednisone and topical creams.  He has been referred to dermatology, but appointment is not for 2 weeks.  He has been having increasing pain so he came to the ER tonight.     Past Medical History:  Diagnosis Date  . Acute on chronic diastolic (congestive) heart failure ([place]) 02/05/2017  . Arthritis   . AVM (arteriovenous malformation) of colon   . AVM (arteriovenous malformation) of small bowel, acquired with hemorrhage 01/20/2012   Found on small bowel capsule endoscopy, June 2013   . Carotid artery disease ([place])    a. Carotid dopple 03/2014 02-77% RICA &  41-28% LICA stenosis b. 7/86  . CKD (chronic kidney disease), stage III   . Clostridium difficile colitis 05/13/2016  . Coronary artery disease involving native coronary artery of native heart without angina pectoris 07/31/2016   Lopressor - d/c 7/19, Lipitor and ASA  . GERD (gastroesophageal reflux disease)   . Gout   . H/O transfusion of whole blood   . Hx of adenomatous colonic polyps 07/02/2016  . Hyperlipidemia   . Hypertension   . Hypothyroidism   . Iron deficiency anemia   . Nonrheumatic aortic valve stenosis   . NSTEMI (non-ST elevated myocardial infarction) ([place])    hx/notes 06/25/2017  . PAD (peripheral artery disease) ([place]) 07/31/2016   On ASA, Plavix  . Personal history of colonic polyps 2007, 2008   adenoma each time, largest 12 mm in 2007  . Plaque psoriasis   . Psoriasis   . Septic phlebitis of upper extremity 08/26/2016   2018 L  hand Dr Megan Salon f/u Keflex and Vanc po  . Staph infection 07/2016   left hand   . Subclavian artery stenosis, right ([place]) 01/16/2015  . Type II diabetes mellitus ([place])    TYPE 2    Patient Active Problem List   Diagnosis Date Noted  . Chest pain 01/31/2019  . Orthostatic dizziness 01/18/2019  . Open wound of heel, right, sequela   . Acute CHF (congestive heart failure) ([place]) 01/04/2019  . Open leg wound 12/16/2018  . Degenerative arthritis of left knee 10/18/2018  . Peripheral vascular disease ([place]) 10/18/2018  . Atrial fibrillation with rapid ventricular response ([place]) 06/25/2017  . Atrial fibrillation with RVR ([place]) 06/25/2017  . GERD (gastroesophageal reflux disease) 02/05/2017  . PAF (paroxysmal atrial fibrillation) ([place]) 02/05/2017  . CKD (chronic kidney disease), stage III 02/05/2017  . Acute on chronic diastolic (congestive) heart failure ([place]) 02/05/2017  . History of GI bleed 08/26/2016  . Septic phlebitis of upper extremity 08/26/2016  . Coronary artery disease involving native coronary artery of native heart without angina pectoris 07/31/2016  . PAD (peripheral artery disease) ([place]) 07/31/2016  . Septic thrombophlebitis of upper extremity 07/31/2016  . History of Clostridium difficile colitis 07/31/2016  . Nonrheumatic aortic valve stenosis   . Chronic diastolic CHF (congestive heart failure) ([place]) 07/27/2016  . Hx of adenomatous colonic polyps  07/02/2016  . Shortness of breath 06/05/2016  . Hypothyroidism 03/17/2016  . Rash and nonspecific skin eruption 03/17/2016  . Noncompliance with diet and medication regimen 12/16/2015  . Frequency-urgency syndrome 03/08/2015  . Subclavian artery stenosis, right ([place]) 01/16/2015  . Cholelithiasis 01/16/2015  . Restless leg syndrome 01/10/2015  . Postherpetic neuralgia 12/18/2014  . Persistent atrial fibrillation   . Hyperlipidemia   . Essential hypertension   . Psoriasis 03/19/2014  . Vitamin D deficiency 11/13/2013  .  Carotid stenosis 11/06/2013  . Psoriatic arthritis ([place]) 11/06/2013  . Hypertriglyceridemia 05/16/2013  . Morbid obesity due to excess calories ([place]) 09/10/2012  . Gout 09/10/2012  . AVM (arteriovenous malformation) of small bowel, acquired with hemorrhage 01/20/2012  . Iron deficiency anemia due to chronic blood loss 01/20/2012  . DM (diabetes mellitus), type 2 with peripheral vascular complications ([place]) 44/31/5400    Past Surgical History:  Procedure Laterality Date  . APPENDECTOMY  02/09/2014  . CARDIAC CATHETERIZATION N/A 07/29/2016   Procedure: Right/Left Heart Cath and Coronary Angiography;  Surgeon: Nelva Bush, MD;  Location: [facility];  Service: Cardiovascular;  Laterality: N/A;  . CARDIAC CATHETERIZATION N/A 07/29/2016   Procedure: Coronary Stent Intervention;  Surgeon: Nelva Bush, MD;  Location: [facility];  Service: Cardiovascular;  Laterality: N/A;  . CARDIAC CATHETERIZATION  10/2014  . COLONOSCOPY  02/10/2011   internal hemorrhoids  . COLONOSCOPY W/ POLYPECTOMY  04/09/2006   12 mm adenoma  . COLONOSCOPY W/ POLYPECTOMY  06/17/2007   5 mm adenoma  . COLONOSCOPY WITH PROPOFOL N/A 05/13/2016   Procedure: COLONOSCOPY WITH PROPOFOL;  Surgeon: Manus Gunning, MD;  Location: [facility]OSCOPY;  Service: Gastroenterology;  Laterality: N/A;  . ENTEROSCOPY N/A 08/14/2016   Procedure: ENTEROSCOPY;  Surgeon: Manus Gunning, MD;  Location: [facility] ENDOSCOPY;  Service: Gastroenterology;  Laterality: N/A;  . ESOPHAGOGASTRODUODENOSCOPY  12/23/2011   Procedure: ESOPHAGOGASTRODUODENOSCOPY (EGD);  Surgeon: Irene Shipper, MD;  Location: [facility];  Service: Endoscopy;  Laterality: N/A;  with small bowel bx's  . GIVENS CAPSULE STUDY  12/28/2011  . GIVENS CAPSULE STUDY N/A 08/14/2016   Procedure: GIVENS CAPSULE STUDY;  Surgeon: Manus Gunning, MD;  Location: [facility];  Service: Gastroenterology;  Laterality: N/A;  . KNEE ARTHROSCOPY Right   . LAPAROSCOPIC  APPENDECTOMY N/A 02/09/2014   Procedure: APPENDECTOMY LAPAROSCOPIC;  Surgeon: Leighton Ruff, MD;  Location: [facility] ORS;  Service: General;  Laterality: N/A;  . LEFT HEART CATHETERIZATION WITH CORONARY ANGIOGRAM N/A 11/15/2014   Procedure: LEFT HEART CATHETERIZATION WITH CORONARY ANGIOGRAM;  Surgeon: Belva Crome, MD;  Location: [facility] CATH LAB;  Service: Cardiovascular;  Laterality: N/A;  . SHOULDER OPEN ROTATOR CUFF REPAIR Left         Home Medications    Prior to Admission medications   Medication Sig Start Date End Date Taking? Authorizing Provider  allopurinol (ZYLOPRIM) 100 MG tablet Take 2 tablets (250m total) by mouth daily 05/09/19   [name], AEvie Lacks MD  amiodarone (PACERONE) 200 MG tablet TAKE 1 TABLET BY MOUTH ONCE DAILY EXCEPT  FOR  SUNDAY 05/09/19   NJosue Hector MD  aspirin 81 MG chewable tablet Chew 1 tablet (81 mg total) by mouth daily. 04/28/17   [name], AEvie Lacks MD  atorvastatin (LIPITOR) 40 MG tablet Take 1 tablet by mouth daily 05/09/19   [name], AEvie Lacks MD  b complex vitamins tablet Take 1 tablet by mouth daily. 04/14/17   [name], AEvie Lacks MD  Blood Glucose Monitoring Suppl (ONE [facility]  ULTRA 2) W/DEVICE KIT Use as directed Dx E11.9 05/18/14   [name], Evie Lacks, MD  Blood Pressure Monitor KIT Use to check blood pressure daily Dx I10 10/04/15   [name], Evie Lacks, MD  CARTIA XT 180 MG 24 hr capsule Take 1 capsule by mouth once daily 06/06/19   Josue Hector, MD  Cholecalciferol (VITAMIN D-3) 1000 units CAPS Take 1,000-5,000 Units by mouth See admin instructions. 1,000 units once a day on Sun/Mon/Tues/Wed/Thurs/Fri and 5,000 units on Sat    [provider]  collagenase (SANTYL) ointment Apply 1 application topically daily. 01/03/19   Landis Martins, DPM  doxycycline (VIBRAMYCIN) 100 MG capsule Take 1 capsule (100 mg total) by mouth 2 (two) times daily. 06/27/19   Orpah Greek, MD  fenofibrate 160 MG tablet Take 1 tablet by mouth daily  06/15/19   Josue Hector, MD  ferrous sulfate 325 (65 FE) MG tablet Take 325 mg by mouth 3 (three) times daily with meals.    [provider]  furosemide (LASIX) 40 MG tablet Take 1 tablet (40 mg total) by mouth 2 (two) times daily. 06/06/19   Josue Hector, MD  gabapentin (NEURONTIN) 300 MG capsule Take 1 capsule (300 mg total) by mouth 3 (three) times daily. 04/20/19   [name], Evie Lacks, MD  glucose blood (ONETOUCH ULTRA) test strip CHECK BLOOD GLUCOSE (SUGAR) 4 TIMES DAILY DX: E11.9, Z79.4 (pt on insulin) 12/28/18   [name], Evie Lacks, MD  hydrALAZINE (APRESOLINE) 50 MG tablet Take 1 tablet (50 mg total) by mouth 3 (three) times daily. 01/06/19   Eugenie Filler, MD  HYDROcodone-acetaminophen (NORCO/VICODIN) 5-325 MG tablet Take 1-2 tablets by mouth every 4 (four) hours as needed for moderate pain. 06/27/19   Orpah Greek, MD  Insulin Isophane & Regular Human (NOVOLIN 70/30 FLEXPEN RELION) (70-30) 100 UNIT/ML PEN Inject 50 Units into the skin 2 (two) times daily with a meal. 05/10/19   [name], Evie Lacks, MD  Insulin Pen Needle 32G X 4 MM MISC Use to administer insulin 09/16/18   [name], Evie Lacks, MD  Lancets (ONETOUCH ULTRASOFT) lancets Use to check blood sugars daily 05/18/14   [name], Evie Lacks, MD  levothyroxine (SYNTHROID) 150 MCG tablet Take 1 tablet (150 mcg total) by mouth daily before breakfast. 06/15/19   [name], Evie Lacks, MD  methylPREDNISolone (MEDROL DOSEPAK) 4 MG TBPK tablet As directed 05/29/19   [name], Evie Lacks, MD  metoprolol tartrate (LOPRESSOR) 25 MG tablet Take 0.5 tablets (12.5 mg total) by mouth 2 (two) times daily. 02/01/19   Isaiah Serge, NP  NEEDLE, DISP, 30 G (BD DISP NEEDLES) 30G X 1/2" MISC USE WITH 1 SYRINGE SUBCUTANEOUSLY  TWICE DAILY 09/15/18   [name], Evie Lacks, MD  nitroGLYCERIN (NITROSTAT) 0.4 MG SL tablet Place 1 tablet under tongue for chest pain. Repeat every 5 minutes as needed, up to 3 doses. If no relief after 3  doses, call 911. 03/18/18   Josue Hector, MD  potassium chloride SA (K-DUR,KLOR-CON) 20 MEQ tablet Take 1 tablet by mouth daily 11/08/17   [name], Evie Lacks, MD  Secukinumab (COSENTYX) 150 MG/ML SOSY Inject into the skin. Inject 370m (2 pens) subcutaneously every 4 weeks    [provider]  triamcinolone ointment (KENALOG) 0.1 % Apply 1 application topically 3 (three) times daily. On leg rash 05/29/19   [name], AEvie Lacks MD    Family History Family History  Problem Relation Age of Onset  . Heart attack Father   .  Lung cancer Father   . Diabetes Father   . Stroke Father   . Hypertension Father   . Stroke Mother   . Hypertension Mother   . Cancer Brother        liver  . Heart disease Brother        chf  . COPD Sister   . Malignant hyperthermia Neg Hx     Social History Social History   Tobacco Use  . Smoking status: Former Smoker    Packs/day: 0.70    Years: 48.00    Pack years: 33.60    Types: Cigarettes    Quit date: 03/29/2006    Years since quitting: 13.2  . Smokeless tobacco: Never Used  Substance Use Topics  . Alcohol use: Yes    Alcohol/week: 1.0 standard drinks    Types: 1 Cans of beer per week    Comment: occasional alcohol intake  . Drug use: No     Allergies   Percocet [oxycodone-acetaminophen], Ciprofloxacin, Metformin and related, and Invokana [canagliflozin]   Review of Systems Review of Systems  Cardiovascular: Positive for leg swelling.  Skin: Positive for rash.  All other systems reviewed and are negative.    Physical Exam Updated Vital Signs BP (!) 154/60 (BP Location: Left Arm)   Pulse 63   Temp 97.9 F (36.6 C) (Oral)   Resp 20   Ht 5' 11"  (1.803 m)   Wt 120.2 kg   SpO2 95%   BMI 36.96 kg/m   Physical Exam Vitals signs and nursing note reviewed.  Constitutional:      General: He is not in acute distress.    Appearance: Normal appearance. He is well-developed.  HENT:     Head: Normocephalic and atraumatic.      Right Ear: Hearing normal.     Left Ear: Hearing normal.     Nose: Nose normal.  Eyes:     Conjunctiva/sclera: Conjunctivae normal.     Pupils: Pupils are equal, round, and reactive to light.  Neck:     Musculoskeletal: Normal range of motion and neck supple.  Cardiovascular:     Rate and Rhythm: Regular rhythm.     Pulses:          Dorsalis pedis pulses are 1+ on the right side and 1+ on the left side.     Heart sounds: S1 normal and S2 normal. No murmur. No friction rub. No gallop.   Pulmonary:     Effort: Pulmonary effort is normal. No respiratory distress.     Breath sounds: Normal breath sounds.  Chest:     Chest wall: No tenderness.  Abdominal:     General: Bowel sounds are normal.     Palpations: Abdomen is soft.     Tenderness: There is no abdominal tenderness. There is no guarding or rebound. Negative signs include Murphy's sign and McBurney's sign.     Hernia: No hernia is present.  Musculoskeletal: Normal range of motion.     Right lower leg: Edema present.     Left lower leg: Edema present.  Skin:    General: Skin is warm and dry.     Findings: Rash present.  Neurological:     Mental Status: He is alert and oriented to person, place, and time.     GCS: GCS eye subscore is 4. GCS verbal subscore is 5. GCS motor subscore is 6.     Cranial Nerves: No cranial nerve deficit.     Sensory: No sensory  deficit.     Coordination: Coordination normal.  Psychiatric:        Speech: Speech normal.        Behavior: Behavior normal.        Thought Content: Thought content normal.          ED Treatments / Results  Labs (all labs ordered are listed, but only abnormal results are displayed) Labs Reviewed  I-STAT CHEM 8, ED - Abnormal; Notable for the following components:      Result Value   BUN 49 (*)    Creatinine, Ser 2.60 (*)    Hemoglobin 9.2 (*)    HCT 27.0 (*)    All other components within normal limits    EKG None  Radiology No results found.   Procedures Procedures (including critical care time)  Medications Ordered in ED Medications  traMADol (ULTRAM) tablet 50 mg (50 mg Oral Given 06/27/19 0023)  doxycycline (VIBRA-TABS) tablet 100 mg (100 mg Oral Given 06/27/19 0023)     Initial Impression / Assessment and Plan / ED Course  I have reviewed the triage vital signs and the nursing notes.  Pertinent labs & imaging results that were available during my care of the patient were reviewed by me and considered in my medical decision making (see chart for details).        Patient complaining of increased swelling and pain of legs.  He has had a rash on his bilateral lower legs since August of this year.  It sounds as though it has been treated as a contact dermatitis in the past.  Examination does show some serosanguineous drainage on the right leg but there are also scattered pustules associated with hair follicles concerning for folliculitis and possible mild superinfection.  He is afebrile.  He does have symmetric bilateral swelling.  Will have patient elevate legs and increase Lasix for 2 days.  Will initiate doxycycline and provide analgesia.  Final Clinical Impressions(s) / ED Diagnoses   Final diagnoses:  Leg swelling  Folliculitis    ED Discharge Orders         Ordered    HYDROcodone-acetaminophen (NORCO/VICODIN) 5-325 MG tablet  Every 4 hours PRN     06/27/19 0013    doxycycline (VIBRAMYCIN) 100 MG capsule  2 times daily     06/27/19 0013           Orpah Greek, MD 06/27/19 [id number]

## 2019-07-24 ENCOUNTER — Encounter: Payer: Self-pay | Admitting: Internal Medicine

## 2019-07-25 ENCOUNTER — Ambulatory Visit: Payer: Medicare Other | Admitting: Internal Medicine

## 2019-08-15 ENCOUNTER — Other Ambulatory Visit: Payer: Self-pay | Admitting: Internal Medicine

## 2019-08-15 NOTE — Telephone Encounter (Signed)
Medication Refill - Medication:  atorvastin (LIPITOR) 40 MG tablet hydrALAZINE (APRESOLINE) 50 MG tablet     Has the patient contacted their pharmacy? No. (Agent: If no, request that the patient contact the pharmacy for the refill.) (Agent: If yes, when and what did the pharmacy advise?)  Preferred Pharmacy (with phone number or street name): [facility] on [address]   Agent: Please be advised that RX refills may take up to 3 business days. We ask that you follow-up with your pharmacy.

## 2019-08-16 NOTE — Telephone Encounter (Signed)
Prescribed by ED in June, would you like pt to continue?

## 2019-08-17 ENCOUNTER — Telehealth: Payer: Self-pay

## 2019-08-17 NOTE — Telephone Encounter (Signed)
New message    1. Which medications need to be refilled? (please list name of each medication and dose if known)   atorvastatin (LIPITOR) 40 MG tablet  hydrALAZINE (APRESOLINE) 50 MG tablet  2. Which pharmacy/location (including street and city if local pharmacy) is medication to be sent to? [facility] / [address]   3. Do they need a 30 day or 90 day supply? 30 days supply

## 2019-08-18 NOTE — Telephone Encounter (Signed)
Is it the question for Mr. Kumpf?  Thanks

## 2019-08-19 MED ORDER — HYDRALAZINE HCL 50 MG PO TABS: 50.0000 mg | ORAL_TABLET | Freq: Three times a day (TID) | ORAL | 11 refills | Status: DC

## 2019-08-21 MED ORDER — ATORVASTATIN CALCIUM 40 MG PO TABS: 40.0000 mg | ORAL_TABLET | Freq: Every day | ORAL | 2 refills | Status: AC

## 2019-08-21 MED ORDER — HYDRALAZINE HCL 50 MG PO TABS: 50.0000 mg | ORAL_TABLET | Freq: Three times a day (TID) | ORAL | 11 refills | Status: AC

## 2019-08-21 NOTE — Telephone Encounter (Signed)
RX sent

## 2019-08-25 ENCOUNTER — Telehealth: Payer: Self-pay | Admitting: Internal Medicine

## 2019-08-25 NOTE — Telephone Encounter (Signed)
Has VOV monday

## 2019-08-25 NOTE — Telephone Encounter (Signed)
    Spouse calling to report patient is not feeling well. Patient cough, fever, diarrhea, congestion for 1 week. Spouse wants to know what OTC med should be given  Please advise

## 2019-08-28 ENCOUNTER — Inpatient Hospital Stay (HOSPITAL_COMMUNITY)
Admission: EM | Admit: 2019-08-28 | Discharge: 2019-09-25 | DRG: 208 | Disposition: E | Payer: HMO | Attending: Pulmonary Disease | Admitting: Pulmonary Disease

## 2019-08-28 ENCOUNTER — Ambulatory Visit: Payer: No Typology Code available for payment source | Admitting: Internal Medicine

## 2019-08-28 ENCOUNTER — Telehealth: Payer: Self-pay

## 2019-08-28 ENCOUNTER — Emergency Department (HOSPITAL_COMMUNITY): Payer: HMO

## 2019-08-28 ENCOUNTER — Encounter (HOSPITAL_COMMUNITY): Payer: Self-pay | Admitting: Internal Medicine

## 2019-08-28 DIAGNOSIS — D631 Anemia in chronic kidney disease: Secondary | ICD-10-CM | POA: Diagnosis present

## 2019-08-28 DIAGNOSIS — I82442 Acute embolism and thrombosis of left tibial vein: Secondary | ICD-10-CM | POA: Diagnosis not present

## 2019-08-28 DIAGNOSIS — Z515 Encounter for palliative care: Secondary | ICD-10-CM | POA: Diagnosis not present

## 2019-08-28 DIAGNOSIS — U071 COVID-19: Secondary | ICD-10-CM | POA: Diagnosis present

## 2019-08-28 DIAGNOSIS — Z0189 Encounter for other specified special examinations: Secondary | ICD-10-CM

## 2019-08-28 DIAGNOSIS — K5521 Angiodysplasia of colon with hemorrhage: Secondary | ICD-10-CM | POA: Diagnosis not present

## 2019-08-28 DIAGNOSIS — J9601 Acute respiratory failure with hypoxia: Secondary | ICD-10-CM | POA: Diagnosis not present

## 2019-08-28 DIAGNOSIS — Z888 Allergy status to other drugs, medicaments and biological substances status: Secondary | ICD-10-CM

## 2019-08-28 DIAGNOSIS — I4819 Other persistent atrial fibrillation: Secondary | ICD-10-CM | POA: Diagnosis present

## 2019-08-28 DIAGNOSIS — R7881 Bacteremia: Secondary | ICD-10-CM | POA: Diagnosis present

## 2019-08-28 DIAGNOSIS — Z789 Other specified health status: Secondary | ICD-10-CM

## 2019-08-28 DIAGNOSIS — Z781 Physical restraint status: Secondary | ICD-10-CM

## 2019-08-28 DIAGNOSIS — I130 Hypertensive heart and chronic kidney disease with heart failure and stage 1 through stage 4 chronic kidney disease, or unspecified chronic kidney disease: Secondary | ICD-10-CM | POA: Diagnosis present

## 2019-08-28 DIAGNOSIS — R6521 Severe sepsis with septic shock: Secondary | ICD-10-CM | POA: Diagnosis not present

## 2019-08-28 DIAGNOSIS — Z87891 Personal history of nicotine dependence: Secondary | ICD-10-CM

## 2019-08-28 DIAGNOSIS — I82451 Acute embolism and thrombosis of right peroneal vein: Secondary | ICD-10-CM | POA: Diagnosis not present

## 2019-08-28 DIAGNOSIS — G92 Toxic encephalopathy: Secondary | ICD-10-CM | POA: Diagnosis not present

## 2019-08-28 DIAGNOSIS — D649 Anemia, unspecified: Secondary | ICD-10-CM | POA: Diagnosis not present

## 2019-08-28 DIAGNOSIS — Z794 Long term (current) use of insulin: Secondary | ICD-10-CM

## 2019-08-28 DIAGNOSIS — I252 Old myocardial infarction: Secondary | ICD-10-CM

## 2019-08-28 DIAGNOSIS — T380X5A Adverse effect of glucocorticoids and synthetic analogues, initial encounter: Secondary | ICD-10-CM | POA: Diagnosis present

## 2019-08-28 DIAGNOSIS — N184 Chronic kidney disease, stage 4 (severe): Secondary | ICD-10-CM | POA: Diagnosis present

## 2019-08-28 DIAGNOSIS — R0602 Shortness of breath: Secondary | ICD-10-CM | POA: Diagnosis present

## 2019-08-28 DIAGNOSIS — Q2733 Arteriovenous malformation of digestive system vessel: Secondary | ICD-10-CM

## 2019-08-28 DIAGNOSIS — Z8672 Personal history of thrombophlebitis: Secondary | ICD-10-CM

## 2019-08-28 DIAGNOSIS — I509 Heart failure, unspecified: Secondary | ICD-10-CM | POA: Diagnosis not present

## 2019-08-28 DIAGNOSIS — N1832 Chronic kidney disease, stage 3b: Secondary | ICD-10-CM | POA: Diagnosis not present

## 2019-08-28 DIAGNOSIS — Z7982 Long term (current) use of aspirin: Secondary | ICD-10-CM | POA: Diagnosis not present

## 2019-08-28 DIAGNOSIS — Z20822 Contact with and (suspected) exposure to covid-19: Secondary | ICD-10-CM

## 2019-08-28 DIAGNOSIS — J1282 Pneumonia due to coronavirus disease 2019: Secondary | ICD-10-CM | POA: Diagnosis present

## 2019-08-28 DIAGNOSIS — N179 Acute kidney failure, unspecified: Secondary | ICD-10-CM | POA: Diagnosis present

## 2019-08-28 DIAGNOSIS — E039 Hypothyroidism, unspecified: Secondary | ICD-10-CM | POA: Diagnosis not present

## 2019-08-28 DIAGNOSIS — Z66 Do not resuscitate: Secondary | ICD-10-CM | POA: Diagnosis not present

## 2019-08-28 DIAGNOSIS — Z8249 Family history of ischemic heart disease and other diseases of the circulatory system: Secondary | ICD-10-CM

## 2019-08-28 DIAGNOSIS — Z8719 Personal history of other diseases of the digestive system: Secondary | ICD-10-CM

## 2019-08-28 DIAGNOSIS — D509 Iron deficiency anemia, unspecified: Secondary | ICD-10-CM | POA: Diagnosis present

## 2019-08-28 DIAGNOSIS — E1122 Type 2 diabetes mellitus with diabetic chronic kidney disease: Secondary | ICD-10-CM | POA: Diagnosis present

## 2019-08-28 DIAGNOSIS — E1142 Type 2 diabetes mellitus with diabetic polyneuropathy: Secondary | ICD-10-CM | POA: Diagnosis present

## 2019-08-28 DIAGNOSIS — A411 Sepsis due to other specified staphylococcus: Secondary | ICD-10-CM | POA: Diagnosis present

## 2019-08-28 DIAGNOSIS — Z8774 Personal history of (corrected) congenital malformations of heart and circulatory system: Secondary | ICD-10-CM | POA: Diagnosis not present

## 2019-08-28 DIAGNOSIS — N183 Chronic kidney disease, stage 3 unspecified: Secondary | ICD-10-CM

## 2019-08-28 DIAGNOSIS — E785 Hyperlipidemia, unspecified: Secondary | ICD-10-CM | POA: Diagnosis present

## 2019-08-28 DIAGNOSIS — D500 Iron deficiency anemia secondary to blood loss (chronic): Secondary | ICD-10-CM | POA: Diagnosis not present

## 2019-08-28 DIAGNOSIS — E1151 Type 2 diabetes mellitus with diabetic peripheral angiopathy without gangrene: Secondary | ICD-10-CM | POA: Diagnosis present

## 2019-08-28 DIAGNOSIS — Z955 Presence of coronary angioplasty implant and graft: Secondary | ICD-10-CM

## 2019-08-28 DIAGNOSIS — Z86010 Personal history of colonic polyps: Secondary | ICD-10-CM

## 2019-08-28 DIAGNOSIS — Z6834 Body mass index (BMI) 34.0-34.9, adult: Secondary | ICD-10-CM

## 2019-08-28 DIAGNOSIS — K922 Gastrointestinal hemorrhage, unspecified: Secondary | ICD-10-CM

## 2019-08-28 DIAGNOSIS — N189 Chronic kidney disease, unspecified: Secondary | ICD-10-CM

## 2019-08-28 DIAGNOSIS — J151 Pneumonia due to Pseudomonas: Secondary | ICD-10-CM | POA: Diagnosis not present

## 2019-08-28 DIAGNOSIS — I340 Nonrheumatic mitral (valve) insufficiency: Secondary | ICD-10-CM | POA: Diagnosis not present

## 2019-08-28 DIAGNOSIS — L400 Psoriasis vulgaris: Secondary | ICD-10-CM | POA: Diagnosis present

## 2019-08-28 DIAGNOSIS — I2510 Atherosclerotic heart disease of native coronary artery without angina pectoris: Secondary | ICD-10-CM

## 2019-08-28 DIAGNOSIS — J80 Acute respiratory distress syndrome: Secondary | ICD-10-CM | POA: Diagnosis not present

## 2019-08-28 DIAGNOSIS — J189 Pneumonia, unspecified organism: Secondary | ICD-10-CM

## 2019-08-28 DIAGNOSIS — I350 Nonrheumatic aortic (valve) stenosis: Secondary | ICD-10-CM | POA: Diagnosis present

## 2019-08-28 DIAGNOSIS — M109 Gout, unspecified: Secondary | ICD-10-CM | POA: Diagnosis present

## 2019-08-28 DIAGNOSIS — I5032 Chronic diastolic (congestive) heart failure: Secondary | ICD-10-CM | POA: Diagnosis present

## 2019-08-28 DIAGNOSIS — R0600 Dyspnea, unspecified: Secondary | ICD-10-CM | POA: Diagnosis present

## 2019-08-28 DIAGNOSIS — Z823 Family history of stroke: Secondary | ICD-10-CM

## 2019-08-28 DIAGNOSIS — B957 Other staphylococcus as the cause of diseases classified elsewhere: Secondary | ICD-10-CM | POA: Diagnosis present

## 2019-08-28 DIAGNOSIS — K219 Gastro-esophageal reflux disease without esophagitis: Secondary | ICD-10-CM | POA: Diagnosis present

## 2019-08-28 DIAGNOSIS — A498 Other bacterial infections of unspecified site: Secondary | ICD-10-CM | POA: Diagnosis not present

## 2019-08-28 DIAGNOSIS — Z79899 Other long term (current) drug therapy: Secondary | ICD-10-CM

## 2019-08-28 DIAGNOSIS — I472 Ventricular tachycardia: Secondary | ICD-10-CM | POA: Diagnosis present

## 2019-08-28 DIAGNOSIS — I361 Nonrheumatic tricuspid (valve) insufficiency: Secondary | ICD-10-CM | POA: Diagnosis not present

## 2019-08-28 DIAGNOSIS — K648 Other hemorrhoids: Secondary | ICD-10-CM | POA: Diagnosis present

## 2019-08-28 DIAGNOSIS — Z9911 Dependence on respirator [ventilator] status: Secondary | ICD-10-CM

## 2019-08-28 DIAGNOSIS — I824Y3 Acute embolism and thrombosis of unspecified deep veins of proximal lower extremity, bilateral: Secondary | ICD-10-CM | POA: Diagnosis not present

## 2019-08-28 DIAGNOSIS — E875 Hyperkalemia: Secondary | ICD-10-CM | POA: Diagnosis not present

## 2019-08-28 DIAGNOSIS — E669 Obesity, unspecified: Secondary | ICD-10-CM | POA: Diagnosis present

## 2019-08-28 DIAGNOSIS — Z79890 Hormone replacement therapy (postmenopausal): Secondary | ICD-10-CM

## 2019-08-28 DIAGNOSIS — G9340 Encephalopathy, unspecified: Secondary | ICD-10-CM | POA: Diagnosis not present

## 2019-08-28 DIAGNOSIS — Z881 Allergy status to other antibiotic agents status: Secondary | ICD-10-CM

## 2019-08-28 DIAGNOSIS — Z825 Family history of asthma and other chronic lower respiratory diseases: Secondary | ICD-10-CM

## 2019-08-28 DIAGNOSIS — Z885 Allergy status to narcotic agent status: Secondary | ICD-10-CM

## 2019-08-28 DIAGNOSIS — Z833 Family history of diabetes mellitus: Secondary | ICD-10-CM

## 2019-08-28 LAB — GLUCOSE, CAPILLARY
Glucose-Capillary: 209 mg/dL — ABNORMAL HIGH (ref 70–99)
Glucose-Capillary: 224 mg/dL — ABNORMAL HIGH (ref 70–99)
Glucose-Capillary: 225 mg/dL — ABNORMAL HIGH (ref 70–99)

## 2019-08-28 LAB — CBC WITH DIFFERENTIAL/PLATELET
Abs Immature Granulocytes: 0.04 10*3/uL (ref 0.00–0.07)
Basophils Absolute: 0 10*3/uL (ref 0.0–0.1)
Basophils Relative: 0 %
Eosinophils Absolute: 0 10*3/uL (ref 0.0–0.5)
Eosinophils Relative: 1 %
HCT: 27.9 % — ABNORMAL LOW (ref 39.0–52.0)
Hemoglobin: 8.6 g/dL — ABNORMAL LOW (ref 13.0–17.0)
Immature Granulocytes: 1 %
Lymphocytes Relative: 5 %
Lymphs Abs: 0.3 10*3/uL — ABNORMAL LOW (ref 0.7–4.0)
MCH: 27 pg (ref 26.0–34.0)
MCHC: 30.8 g/dL (ref 30.0–36.0)
MCV: 87.7 fL (ref 80.0–100.0)
Monocytes Absolute: 0.3 10*3/uL (ref 0.1–1.0)
Monocytes Relative: 5 %
Neutro Abs: 5 10*3/uL (ref 1.7–7.7)
Neutrophils Relative %: 88 %
Platelets: 224 10*3/uL (ref 150–400)
RBC: 3.18 MIL/uL — ABNORMAL LOW (ref 4.22–5.81)
RDW: 17.2 % — ABNORMAL HIGH (ref 11.5–15.5)
WBC: 5.6 10*3/uL (ref 4.0–10.5)
nRBC: 0 % (ref 0.0–0.2)

## 2019-08-28 LAB — COMPREHENSIVE METABOLIC PANEL
ALT: 23 U/L (ref 0–44)
AST: 39 U/L (ref 15–41)
Albumin: 2.8 g/dL — ABNORMAL LOW (ref 3.5–5.0)
Alkaline Phosphatase: 61 U/L (ref 38–126)
Anion gap: 10 (ref 5–15)
BUN: 48 mg/dL — ABNORMAL HIGH (ref 8–23)
CO2: 22 mmol/L (ref 22–32)
Calcium: 7.6 mg/dL — ABNORMAL LOW (ref 8.9–10.3)
Chloride: 106 mmol/L (ref 98–111)
Creatinine, Ser: 2.38 mg/dL — ABNORMAL HIGH (ref 0.61–1.24)
GFR calc Af Amer: 30 mL/min — ABNORMAL LOW (ref >60)
GFR calc non Af Amer: 26 mL/min — ABNORMAL LOW (ref >60)
Glucose, Bld: 200 mg/dL — ABNORMAL HIGH (ref 70–99)
Potassium: 4 mmol/L (ref 3.5–5.1)
Sodium: 138 mmol/L (ref 135–145)
Total Bilirubin: 1 mg/dL (ref 0.3–1.2)
Total Protein: 6.4 g/dL — ABNORMAL LOW (ref 6.5–8.1)

## 2019-08-28 LAB — FIBRINOGEN: Fibrinogen: 572 mg/dL — ABNORMAL HIGH (ref 210–475)

## 2019-08-28 LAB — POC OCCULT BLOOD, ED: Fecal Occult Bld: POSITIVE — AB

## 2019-08-28 LAB — IRON AND TIBC
Iron: 22 ug/dL — ABNORMAL LOW (ref 45–182)
Saturation Ratios: 9 % — ABNORMAL LOW (ref 17.9–39.5)
TIBC: 237 ug/dL — ABNORMAL LOW (ref 250–450)
UIBC: 215 ug/dL

## 2019-08-28 LAB — RESPIRATORY PANEL BY RT PCR (FLU A&B, COVID)
Influenza A by PCR: NEGATIVE
Influenza B by PCR: NEGATIVE
SARS Coronavirus 2 by RT PCR: POSITIVE — AB

## 2019-08-28 LAB — HEMOGLOBIN A1C
Hgb A1c MFr Bld: 5.7 % — ABNORMAL HIGH (ref 4.8–5.6)
Mean Plasma Glucose: 116.89 mg/dL

## 2019-08-28 LAB — FERRITIN
Ferritin: 778 ng/mL — ABNORMAL HIGH (ref 24–336)
Ferritin: 814 ng/mL — ABNORMAL HIGH (ref 24–336)

## 2019-08-28 LAB — BRAIN NATRIURETIC PEPTIDE: B Natriuretic Peptide: 345.2 pg/mL — ABNORMAL HIGH (ref 0.0–100.0)

## 2019-08-28 LAB — LACTATE DEHYDROGENASE: LDH: 378 U/L — ABNORMAL HIGH (ref 98–192)

## 2019-08-28 LAB — TRIGLYCERIDES: Triglycerides: 133 mg/dL (ref <150)

## 2019-08-28 LAB — POC SARS CORONAVIRUS 2 AG -  ED: SARS Coronavirus 2 Ag: NEGATIVE

## 2019-08-28 LAB — LACTIC ACID, PLASMA: Lactic Acid, Venous: 1.8 mmol/L (ref 0.5–1.9)

## 2019-08-28 LAB — D-DIMER, QUANTITATIVE: D-Dimer, Quant: 1.76 ug/mL-FEU — ABNORMAL HIGH (ref 0.00–0.50)

## 2019-08-28 LAB — C-REACTIVE PROTEIN: CRP: 8.2 mg/dL — ABNORMAL HIGH (ref <1.0)

## 2019-08-28 LAB — TYPE AND SCREEN
ABO/RH(D): A POS
Antibody Screen: NEGATIVE

## 2019-08-28 LAB — PROCALCITONIN: Procalcitonin: 0.19 ng/mL

## 2019-08-28 MED ORDER — DEXAMETHASONE SODIUM PHOSPHATE 10 MG/ML IJ SOLN
6.0000 mg | Freq: Once | INTRAMUSCULAR | Status: AC
  Administered 2019-08-28: 6 mg via INTRAVENOUS
  Filled 2019-08-28: qty 1

## 2019-08-28 MED ORDER — SODIUM CHLORIDE 0.9 % IV SOLN
200.0000 mg | Freq: Once | INTRAVENOUS | Status: AC
  Administered 2019-08-28: 18:00:00 200 mg via INTRAVENOUS
  Filled 2019-08-28: qty 200

## 2019-08-28 MED ORDER — ASCORBIC ACID 500 MG PO TABS
500.0000 mg | ORAL_TABLET | Freq: Every day | ORAL | Status: DC
  Administered 2019-08-28 – 2019-09-10 (×13): 500 mg via ORAL
  Filled 2019-08-28 (×13): qty 1

## 2019-08-28 MED ORDER — DEXAMETHASONE SODIUM PHOSPHATE 10 MG/ML IJ SOLN
6.0000 mg | INTRAMUSCULAR | Status: DC
  Administered 2019-08-29 – 2019-09-02 (×5): 6 mg via INTRAVENOUS
  Filled 2019-08-28 (×5): qty 1

## 2019-08-28 MED ORDER — INSULIN ASPART 100 UNIT/ML ~~LOC~~ SOLN
0.0000 [IU] | Freq: Three times a day (TID) | SUBCUTANEOUS | Status: DC
  Administered 2019-08-28: 18:00:00 3 [IU] via SUBCUTANEOUS
  Administered 2019-08-29: 12:00:00 1 [IU] via SUBCUTANEOUS
  Administered 2019-08-29 (×2): 2 [IU] via SUBCUTANEOUS
  Administered 2019-08-30: 12:00:00 3 [IU] via SUBCUTANEOUS
  Administered 2019-08-30: 1 [IU] via SUBCUTANEOUS
  Administered 2019-08-30: 7 [IU] via SUBCUTANEOUS
  Administered 2019-08-31 (×2): 2 [IU] via SUBCUTANEOUS
  Administered 2019-08-31 – 2019-09-01 (×3): 5 [IU] via SUBCUTANEOUS
  Administered 2019-09-01: 08:00:00 2 [IU] via SUBCUTANEOUS
  Administered 2019-09-02: 17:00:00 5 [IU] via SUBCUTANEOUS
  Administered 2019-09-02 – 2019-09-03 (×5): 2 [IU] via SUBCUTANEOUS
  Administered 2019-09-04: 3 [IU] via SUBCUTANEOUS
  Administered 2019-09-04: 2 [IU] via SUBCUTANEOUS
  Administered 2019-09-04 – 2019-09-07 (×9): 3 [IU] via SUBCUTANEOUS
  Administered 2019-09-07: 12:00:00 5 [IU] via SUBCUTANEOUS
  Administered 2019-09-08: 1 [IU] via SUBCUTANEOUS
  Administered 2019-09-08 (×2): 2 [IU] via SUBCUTANEOUS

## 2019-08-28 MED ORDER — HYDROCORTISONE ACETATE 25 MG RE SUPP
25.0000 mg | Freq: Every day | RECTAL | Status: AC
  Administered 2019-08-30: 22:00:00 25 mg via RECTAL
  Filled 2019-08-28 (×6): qty 1

## 2019-08-28 MED ORDER — INSULIN GLARGINE 100 UNIT/ML ~~LOC~~ SOLN
25.0000 [IU] | Freq: Every day | SUBCUTANEOUS | Status: DC
  Administered 2019-08-28 – 2019-09-05 (×9): 25 [IU] via SUBCUTANEOUS
  Filled 2019-08-28 (×9): qty 0.25

## 2019-08-28 MED ORDER — FENOFIBRATE 160 MG PO TABS
160.0000 mg | ORAL_TABLET | Freq: Every day | ORAL | Status: DC
  Administered 2019-08-29 – 2019-09-08 (×11): 160 mg via ORAL
  Filled 2019-08-28 (×13): qty 1

## 2019-08-28 MED ORDER — AMIODARONE HCL 200 MG PO TABS
200.0000 mg | ORAL_TABLET | ORAL | Status: DC
  Administered 2019-08-29 – 2019-09-08 (×10): 200 mg via ORAL
  Filled 2019-08-28 (×2): qty 2
  Filled 2019-08-28: qty 1
  Filled 2019-08-28 (×3): qty 2
  Filled 2019-08-28: qty 1
  Filled 2019-08-28 (×2): qty 2
  Filled 2019-08-28: qty 1

## 2019-08-28 MED ORDER — METOPROLOL TARTRATE 25 MG PO TABS
12.5000 mg | ORAL_TABLET | Freq: Two times a day (BID) | ORAL | Status: DC
  Administered 2019-08-28 – 2019-09-08 (×23): 12.5 mg via ORAL
  Filled 2019-08-28 (×23): qty 1

## 2019-08-28 MED ORDER — INSULIN ASPART 100 UNIT/ML ~~LOC~~ SOLN
0.0000 [IU] | Freq: Every day | SUBCUTANEOUS | Status: DC
  Administered 2019-08-28 – 2019-08-30 (×2): 2 [IU] via SUBCUTANEOUS
  Administered 2019-08-31 – 2019-09-01 (×2): 3 [IU] via SUBCUTANEOUS
  Administered 2019-09-02: 21:00:00 2 [IU] via SUBCUTANEOUS
  Administered 2019-09-03 – 2019-09-04 (×2): 3 [IU] via SUBCUTANEOUS
  Administered 2019-09-06: 4 [IU] via SUBCUTANEOUS
  Administered 2019-09-07: 3 [IU] via SUBCUTANEOUS

## 2019-08-28 MED ORDER — ALLOPURINOL 100 MG PO TABS
200.0000 mg | ORAL_TABLET | Freq: Every day | ORAL | Status: DC
  Administered 2019-08-29 – 2019-09-10 (×12): 200 mg via ORAL
  Filled 2019-08-28 (×12): qty 2

## 2019-08-28 MED ORDER — GUAIFENESIN-DM 100-10 MG/5ML PO SYRP
10.0000 mL | ORAL_SOLUTION | ORAL | Status: DC | PRN
  Administered 2019-08-29 – 2019-09-02 (×6): 10 mL via ORAL
  Filled 2019-08-28 (×7): qty 10

## 2019-08-28 MED ORDER — DILTIAZEM HCL ER COATED BEADS 180 MG PO CP24
180.0000 mg | ORAL_CAPSULE | Freq: Every day | ORAL | Status: DC
  Administered 2019-08-29 – 2019-09-08 (×11): 180 mg via ORAL
  Filled 2019-08-28 (×12): qty 1

## 2019-08-28 MED ORDER — ZINC SULFATE 220 (50 ZN) MG PO CAPS
220.0000 mg | ORAL_CAPSULE | Freq: Every day | ORAL | Status: DC
  Administered 2019-08-28 – 2019-09-08 (×12): 220 mg via ORAL
  Filled 2019-08-28 (×12): qty 1

## 2019-08-28 MED ORDER — SODIUM CHLORIDE 0.9 % IV SOLN
100.0000 mg | Freq: Every day | INTRAVENOUS | Status: AC
  Administered 2019-08-29 – 2019-09-01 (×4): 100 mg via INTRAVENOUS
  Filled 2019-08-28 (×4): qty 20

## 2019-08-28 MED ORDER — LEVOTHYROXINE SODIUM 150 MCG PO TABS
150.0000 ug | ORAL_TABLET | Freq: Every day | ORAL | Status: DC
  Administered 2019-08-29 – 2019-09-10 (×13): 150 ug via ORAL
  Filled 2019-08-28 (×9): qty 2
  Filled 2019-08-28: qty 1
  Filled 2019-08-28 (×3): qty 2
  Filled 2019-08-28: qty 1

## 2019-08-28 MED ORDER — TOCILIZUMAB 400 MG/20ML IV SOLN: 8.0000 mg/kg | Freq: Once | INTRAVENOUS | Status: DC

## 2019-08-28 MED ORDER — FERROUS SULFATE 325 (65 FE) MG PO TABS
325.0000 mg | ORAL_TABLET | Freq: Every day | ORAL | Status: DC
  Administered 2019-08-29 – 2019-09-10 (×12): 325 mg via ORAL
  Filled 2019-08-28 (×12): qty 1

## 2019-08-28 MED ORDER — ATORVASTATIN CALCIUM 40 MG PO TABS
40.0000 mg | ORAL_TABLET | Freq: Every day | ORAL | Status: DC
  Administered 2019-08-28 – 2019-09-10 (×13): 40 mg via ORAL
  Filled 2019-08-28 (×14): qty 1

## 2019-08-28 MED ORDER — FERROUS SULFATE 325 (65 FE) MG PO TABS
325.0000 mg | ORAL_TABLET | Freq: Three times a day (TID) | ORAL | Status: DC
  Filled 2019-08-28 (×2): qty 1

## 2019-08-28 MED ORDER — SODIUM CHLORIDE 0.9 % IV SOLN
510.0000 mg | Freq: Once | INTRAVENOUS | Status: AC
  Administered 2019-08-28: 20:00:00 510 mg via INTRAVENOUS
  Filled 2019-08-28: qty 17

## 2019-08-28 NOTE — Telephone Encounter (Signed)
Patient's wife calling and states that the patient's oxygen levels were ranging from 82-86. Spoke with Delice Bison and she states that the patient needs to go to the hospital to be checked out because that is too low. Advised wife of request and states that she is going to call 911 and have EMS come take him to the hospital.

## 2019-08-28 NOTE — ED Triage Notes (Signed)
Transported by [facility] from home-- household has been experiencing a "cold" x 1 week. Patient has the following symptoms: + sob, weakness, n/v/d, fever and cough. CBG 194 mg/dl. On room air, patient's oxygen saturation was 90% on 2 L of oxygen--99%.

## 2019-08-28 NOTE — H&P (Addendum)
History and Physical    CHAOS CARLILE EYC:[id number] DOB: [birth year] DOA: 08/31/2019  PCP: Cassandria Anger, MD   Patient coming from: Home    Chief Complaint: Cold-like symptoms, cough, shortness of breath, weakness  HPI: Michael [name] is a 79 y.o. male with medical history significant of CKD stage 3b, paroxysmal A. fib, hypertension, obesity, coronary artery disease, chronic diastolic CHF, anemia who presents to the emergency department with complaints of cold-like symptoms, cough, fatigue, poor appetite.  This has been going on for last few days.  He lives with his wife. Patient was feeling cold, shortness of breath at home and it was getting worse.  He said he was freezing to death.  As per the wife, he was sleeping all the time and not taking his meds.  He has low appetite and was not drinking and eating well. Family members also had some cold-like symptoms but have not been tested.   He follows with cardiology for his A. fib, congestive heart failure and also follows with nephrology.  He follows with Dr. [name] for his history of AVMs. Patient seen and examined at the bedside in the emergency department.  Currently hemodynamically stable.  On 2 L of oxygen per minute.  He denies any fever, abdomen pain, nausea, vomiting, diarrhea or dysuria.  ED Course: Chest x-ray done in the emergency department showed multifocal pneumonia.  Initial rapid Covid test was negative.  And the screen test has been sent.  Currently he is on 2 L of oxygen per minute.  Review of Systems: As per HPI otherwise 10 point review of systems negative.    Past Medical History:  Diagnosis Date  . Acute on chronic diastolic (congestive) heart failure ([place]) 02/05/2017  . Arthritis   . AVM (arteriovenous malformation) of colon   . AVM (arteriovenous malformation) of small bowel, acquired with hemorrhage 01/20/2012   Found on small bowel capsule endoscopy, June 2013   . Carotid artery disease ([place])    a. Carotid  dopple 03/2014 14-97% RICA &  02-63% LICA stenosis b. 7/85  . CKD (chronic kidney disease), stage III   . Clostridium difficile colitis 05/13/2016  . Coronary artery disease involving native coronary artery of native heart without angina pectoris 07/31/2016   Lopressor - d/c 7/19, Lipitor and ASA  . GERD (gastroesophageal reflux disease)   . Gout   . H/O transfusion of whole blood   . Hx of adenomatous colonic polyps 07/02/2016  . Hyperlipidemia   . Hypertension   . Hypothyroidism   . Iron deficiency anemia   . Nonrheumatic aortic valve stenosis   . NSTEMI (non-ST elevated myocardial infarction) ([place])    hx/notes 06/25/2017  . PAD (peripheral artery disease) ([place]) 07/31/2016   On ASA, Plavix  . Personal history of colonic polyps 2007, 2008   adenoma each time, largest 12 mm in 2007  . Plaque psoriasis   . Psoriasis   . Septic phlebitis of upper extremity 08/26/2016   2018 L hand Dr Megan Salon f/u Keflex and Vanc po  . Staph infection 07/2016   left hand   . Subclavian artery stenosis, right ([place]) 01/16/2015  . Type II diabetes mellitus ([place])    TYPE 2    Past Surgical History:  Procedure Laterality Date  . APPENDECTOMY  02/09/2014  . CARDIAC CATHETERIZATION N/A 07/29/2016   Procedure: Right/Left Heart Cath and Coronary Angiography;  Surgeon: Nelva Bush, MD;  Location: [facility];  Service: Cardiovascular;  Laterality: N/A;  .  CARDIAC CATHETERIZATION N/A 07/29/2016   Procedure: Coronary Stent Intervention;  Surgeon: Nelva Bush, MD;  Location: [facility];  Service: Cardiovascular;  Laterality: N/A;  . CARDIAC CATHETERIZATION  10/2014  . COLONOSCOPY  02/10/2011   internal hemorrhoids  . COLONOSCOPY W/ POLYPECTOMY  04/09/2006   12 mm adenoma  . COLONOSCOPY W/ POLYPECTOMY  06/17/2007   5 mm adenoma  . COLONOSCOPY WITH PROPOFOL N/A 05/13/2016   Procedure: COLONOSCOPY WITH PROPOFOL;  Surgeon: Manus Gunning, MD;  Location: [facility];  Service:  Gastroenterology;  Laterality: N/A;  . ENTEROSCOPY N/A 08/14/2016   Procedure: ENTEROSCOPY;  Surgeon: Manus Gunning, MD;  Location: [facility]COPY;  Service: Gastroenterology;  Laterality: N/A;  . ESOPHAGOGASTRODUODENOSCOPY  12/23/2011   Procedure: ESOPHAGOGASTRODUODENOSCOPY (EGD);  Surgeon: Irene Shipper, MD;  Location: [facility];  Service: Endoscopy;  Laterality: N/A;  with small bowel bx's  . GIVENS CAPSULE STUDY  12/28/2011  . GIVENS CAPSULE STUDY N/A 08/14/2016   Procedure: GIVENS CAPSULE STUDY;  Surgeon: Manus Gunning, MD;  Location: [place] [facility];  Service: Gastroenterology;  Laterality: N/A;  . KNEE ARTHROSCOPY Right   . LAPAROSCOPIC APPENDECTOMY N/A 02/09/2014   Procedure: APPENDECTOMY LAPAROSCOPIC;  Surgeon: Leighton Ruff, MD;  Location: [facility];  Service: General;  Laterality: N/A;  . LEFT HEART CATHETERIZATION WITH CORONARY ANGIOGRAM N/A 11/15/2014   Procedure: LEFT HEART CATHETERIZATION WITH CORONARY ANGIOGRAM;  Surgeon: Belva Crome, MD;  [facility] LAB;  Service: Cardiovascular;  Laterality: N/A;  . SHOULDER OPEN ROTATOR CUFF REPAIR Left      reports that he quit smoking about 13 years ago. His smoking use included cigarettes. He has a 33.60 pack-year smoking history. He has never used smokeless tobacco. He reports current alcohol use of about 1.0 standard drinks of alcohol per week. He reports that he does not use drugs.  Allergies  Allergen Reactions  . Percocet [Oxycodone-Acetaminophen] Nausea And Vomiting  . Ciprofloxacin Other (See Comments)    Light headed and made stagger  . Metformin And Related Nausea Only    Upset stomach  . Invokana [Canagliflozin] Rash    Family History  Problem Relation Age of Onset  . Heart attack Father   . Lung cancer Father   . Diabetes Father   . Stroke Father   . Hypertension Father   . Stroke Mother   . Hypertension Mother   . Cancer Brother        liver  . Heart disease Brother        chf  . COPD Sister     . Malignant hyperthermia Neg Hx      Prior to Admission medications   Medication Sig Start Date End Date Taking? Authorizing Provider  allopurinol (ZYLOPRIM) 100 MG tablet Take 2 tablets (240m total) by mouth daily Patient taking differently: Take 200 mg by mouth daily. Take 2 tablets (2025mtotal) by mouth daily 05/09/19  Yes [name], AlEvie LacksMD  amiodarone (PACERONE) 200 MG tablet TAKE 1 TABLET BY MOUTH ONCE DAILY EXCEPT  FOR  SUNDAY Patient taking differently: Take 200 mg by mouth See admin instructions. Take daily EXCEPT: Sunday 05/09/19  Yes NiJosue HectorMD  aspirin 81 MG chewable tablet Chew 1 tablet (81 mg total) by mouth daily. 04/28/17  Yes [name], AlEvie LacksMD  atorvastatin (LIPITOR) 40 MG tablet Take 1 tablet (40 mg total) by mouth daily. Patient taking differently: Take 40 mg by mouth daily at 6 PM.  08/21/19  Yes [name], AlTyrone Apple  V, MD  b complex vitamins tablet Take 1 tablet by mouth daily. 04/14/17  Yes [name], Evie Lacks, MD  CARTIA XT 180 MG 24 hr capsule Take 1 capsule by mouth once daily Patient taking differently: Take 180 mg by mouth daily.  06/06/19  Yes Josue Hector, MD  Cholecalciferol (VITAMIN D-3) 1000 units CAPS Take 1,000-5,000 Units by mouth See admin instructions. 1,000 units once a day on Sun/Mon/Tues/Wed/Thurs/Fri and 5,000 units on Sat   Yes [provider]  fenofibrate 160 MG tablet Take 1 tablet by mouth daily Patient taking differently: Take 160 mg by mouth daily.  06/15/19  Yes Josue Hector, MD  ferrous sulfate 325 (65 FE) MG tablet Take 325 mg by mouth 3 (three) times daily with meals.   Yes [provider]  furosemide (LASIX) 40 MG tablet Take 1 tablet (40 mg total) by mouth 2 (two) times daily. 06/06/19  Yes Josue Hector, MD  gabapentin (NEURONTIN) 300 MG capsule Take 1 capsule (300 mg total) by mouth 3 (three) times daily. 04/20/19  Yes [name], Evie Lacks, MD  halobetasol (ULTRAVATE) 0.05 % cream Apply 1  application topically 2 (two) times daily. 07/15/19  Yes [provider]  hydrALAZINE (APRESOLINE) 50 MG tablet Take 1 tablet (50 mg total) by mouth 3 (three) times daily. 08/21/19  Yes [name], Evie Lacks, MD  Insulin Isophane & Regular Human (NOVOLIN 70/30 FLEXPEN RELION) (70-30) 100 UNIT/ML PEN Inject 50 Units into the skin 2 (two) times daily with a meal. Patient taking differently: Inject 50 Units into the skin 2 (two) times daily.  05/10/19  Yes [name], Evie Lacks, MD  levothyroxine (SYNTHROID) 150 MCG tablet Take 1 tablet (150 mcg total) by mouth daily before breakfast. 06/15/19  Yes [name], Evie Lacks, MD  lisinopril (ZESTRIL) 5 MG tablet Take 5 mg by mouth at bedtime. 06/13/19  Yes [provider]  metoprolol tartrate (LOPRESSOR) 25 MG tablet Take 0.5 tablets (12.5 mg total) by mouth 2 (two) times daily. 02/01/19  Yes Isaiah Serge, NP  nitroGLYCERIN (NITROSTAT) 0.4 MG SL tablet Place 1 tablet under tongue for chest pain. Repeat every 5 minutes as needed, up to 3 doses. If no relief after 3 doses, call 911. Patient taking differently: Place 0.4 mg under the tongue every 5 (five) minutes as needed for chest pain.  03/18/18  Yes Josue Hector, MD  Secukinumab (COSENTYX) 150 MG/ML SOSY Inject 150 mg into the skin every 30 (thirty) days. Inject 338m (2 pens) subcutaneously every 4 weeks    Yes [provider]  Blood Glucose Monitoring Suppl (ONE TOUCH ULTRA 2) W/DEVICE KIT Use as directed Dx E11.9 05/18/14   [name], AEvie Lacks MD  Blood Pressure Monitor KIT Use to check blood pressure daily Dx I10 10/04/15   [name], AEvie Lacks MD  glucose blood (ONETOUCH ULTRA) test strip CHECK BLOOD GLUCOSE (SUGAR) 4 TIMES DAILY DX: E11.9, Z79.4 (pt on insulin) 12/28/18   [name], AEvie Lacks MD  Insulin Pen Needle 32G X 4 MM MISC Use to administer insulin 09/16/18   [name], AEvie Lacks MD  Lancets (ONETOUCH ULTRASOFT) lancets Use to check blood sugars daily 05/18/14    [name], AEvie Lacks MD  NEEDLE, DISP, 30 G (BD DISP NEEDLES) 30G X 1/2" MISC USE WITH 1 SYRINGE SUBCUTANEOUSLY  TWICE DAILY 09/15/18   [name], AEvie Lacks MD    Physical Exam: Vitals:   09/04/2019 1215 09/20/2019 1230 09/04/2019 1330 09/16/2019 1400  BP: 132/60 (!) 119/43 (!) 126/43 ([name]  131/47  Pulse: 64 62 64 65  Resp: _0 Temp:      TempSrc:      SpO2: 99% 100% 100% 97%    Constitutional: Not in respiratory distress, morbidly obese Vitals:   09/18/2019 1215 08/29/2019 1230 09/08/2019 1330 09/04/2019 1400  BP: 132/60 (!) 119/43 (!) 126/43 (!) 131/47  Pulse: 64 62 64 65  Resp: _1 Temp:      TempSrc:      SpO2: 99% 100% 100% 97%   Eyes: PERRL, lids and conjunctivae normal  Neck: normal, supple, no masses, no thyromegaly Respiratory: Decreased air entry bilaterally, no frank wheezing, no frank crackles. Normal respiratory effort. No accessory muscle use.  Cardiovascular: Regular rate and rhythm, no murmurs / rubs / gallops. No extremity edema.  Abdomen: no tenderness, no masses palpated. No hepatosplenomegaly. Bowel sounds positive.  Musculoskeletal: no clubbing / cyanosis. No joint deformity upper and lower extremities.  Skin: no rashes, lesions, ulcers. No induration Neurologic: CN 2-12 grossly intact.  Strength 5/5 in all 4.  Psychiatric: Normal judgment and insight. Alert and oriented x 3. Normal mood.   Foley Catheter:None  Labs on Admission: I have personally reviewed following labs and imaging studies  CBC: Recent Labs  Lab 09/23/2019 1113  WBC 5.6  NEUTROABS 5.0  HGB 8.6*  HCT 27.9*  MCV 87.7  PLT 842   Basic Metabolic Panel: Recent Labs  Lab 09/02/2019 1113  NA 138  K 4.0  CL 106  CO2 22  GLUCOSE 200*  BUN 48*  CREATININE 2.38*  CALCIUM 7.6*   GFR: CrCl cannot be calculated (Unknown ideal weight.). Liver Function Tests: Recent Labs  Lab 09/06/2019 1113  AST 39  ALT 23  ALKPHOS 61  BILITOT 1.0  PROT 6.4*  ALBUMIN 2.8*   No results for  input(s): LIPASE, AMYLASE in the last 168 hours. No results for input(s): AMMONIA in the last 168 hours. Coagulation Profile: No results for input(s): INR, PROTIME in the last 168 hours. Cardiac Enzymes: No results for input(s): CKTOTAL, CKMB, CKMBINDEX, TROPONINI in the last 168 hours. BNP (last 3 results) No results for input(s): PROBNP in the last 8760 hours. HbA1C: No results for input(s): HGBA1C in the last 72 hours. CBG: No results for input(s): GLUCAP in the last 168 hours. Lipid Profile: No results for input(s): CHOL, HDL, LDLCALC, TRIG, CHOLHDL, LDLDIRECT in the last 72 hours. Thyroid Function Tests: No results for input(s): TSH, T4TOTAL, FREET4, T3FREE, THYROIDAB in the last 72 hours. Anemia Panel: Recent Labs    08/29/2019 1113  FERRITIN 778*   Urine analysis:    Component Value Date/Time   COLORURINE YELLOW 05/11/2016 1600   APPEARANCEUR CLEAR 05/11/2016 1600   LABSPEC 1.021 05/11/2016 1600   PHURINE 5.5 05/11/2016 1600   GLUCOSEU NEGATIVE 05/11/2016 1600   GLUCOSEU NEGATIVE 11/07/2013 0754   HGBUR NEGATIVE 05/11/2016 1600   BILIRUBINUR NEGATIVE 05/11/2016 1600   BILIRUBINUR neg 03/08/2015 1105   KETONESUR NEGATIVE 05/11/2016 1600   PROTEINUR NEGATIVE 05/11/2016 1600   UROBILINOGEN negative 03/08/2015 1105   UROBILINOGEN 0.2 02/09/2014 1805   NITRITE NEGATIVE 05/11/2016 1600   LEUKOCYTESUR NEGATIVE 05/11/2016 1600    Radiological Exams on Admission: DG Chest Port 1 View  Result Date: 09/10/2019 CLINICAL DATA:  79 year old male with a history of shortness of breath EXAM: PORTABLE CHEST 1 VIEW COMPARISON:  01/31/2011 FINDINGS: Cardiomediastinal silhouette unchanged in size and contour. Low lung volumes with new mixed interstitial and airspace disease  of the right lung, and to a lesser degree the lower left lung. No pneumothorax or large pleural effusion. IMPRESSION: New right greater than left mixed interstitial and airspace disease, suggesting multifocal  pneumonia. Electronically Signed   By: Corrie Mckusick D.O.   On: 09/24/2019 11:40     Assessment/Plan Active Problems:   DM (diabetes mellitus), type 2 with peripheral vascular complications ([facility])   AVM (arteriovenous malformation) of small bowel, acquired with hemorrhage   Iron deficiency anemia due to chronic blood loss   Gout   Persistent atrial fibrillation   Hyperlipidemia   Hypothyroidism   Shortness of breath   Chronic diastolic CHF (congestive heart failure) ([facility])   CKD (chronic kidney disease), stage III   Dyspnea   Multifocal pneumonia: Highly suspicious for Covid-19 pneumonia. chest x-ray showed new bilateral  interstitial, airspace disease. Rapid Covid test was negative.  Second test is pending. Currently he is on 2 L of oxygen per minute. Complains of cough, shortness of breath. We will order Decadron, remdesivir after getting final result of Covid screening test.  GI bleed: FOBT positive.  His last hemoglobin was 11.2 on 7/20 which is dropped to 8.6 today.  Patient is on aspirin at home.  Denies any frank bleeding or change in the color of the stool.He has  history of GI bleed due to AVMs.  He follows with Dr. Arelia Longest.  GI consulted. Iron studies showed severe iron deficiency.  Ordered a dose of IV iron.  Coronary artery disease: Last cath was in 2018 which was done after he presented with an NSTEMI.  Has DES to OM1.  Follows with cardiology, on aspirin, beta-blockers, statin.  Paroxysmal A. fib: Currently in normal sinus rhythm.  On rate control with metoprolol and diltiazem.  Not on anticoagulation due to history of GI bleed.  Diastolic CHF: Echocardiogram on June 2020 showed ejection fraction of 66-65% .Previous Echo had shown grade 2 diastolic dysfunction.  On Lasix at home.  Diuretics on hold.  Currently euvolemic .Elevated BNP  Hypertension: Currently blood pressure stable.  Continue current medications.  Hyperlipidemia: On statin  Hypothyroidism: Continue  Synthyroid  Diabetes type 2: On insulin at home.  Continue sliding-scale insulin for now.  CKD stage IIIb-IV: His baseline creatinine is around 2.  Currently kidney function near baseline.  Follows with [facility].  H/O psoriasis:On cosentyx.  Addendum: Patient found to be Covid positive.  Started on remdesivir, Decadron.   Actemra not given because he is on Cosentyx.We will consider giving actemra if hypoxia worsens.   Severity of Illness: The appropriate patient status for this patient is INPATIENT.  DVT prophylaxis: SCD Code Status: Full Family Communication: Discussed with wife on phone Consults called: GI     Shelly Coss MD [facility]  09/22/2019, 2:24 PM

## 2019-08-28 NOTE — Telephone Encounter (Signed)
Noted! Thank you

## 2019-08-28 NOTE — ED Provider Notes (Signed)
[facility] Emergency Department Provider Note MRN:  [id number]  Arrival date & time: 09/08/2019     Chief Complaint   Weakness   History of Present Illness   Michael [name] is a 79 y.o. year-old male with a history of diabetes, CAD, CKD presenting to the ED with chief complaint of weakness.  1 week of cold-like symptoms, few days of shortness of breath, weakness, nausea, vomiting, diarrhea, fever, cough.  Patient also endorsing black stools.  Denies chest pain.  Symptoms constant, no exacerbating or alleviating factors, moderate severity.  Review of Systems  A complete 10 system review of systems was obtained and all systems are negative except as noted in the HPI and PMH.   Patient's Health History    Past Medical History:  Diagnosis Date  . Acute on chronic diastolic (congestive) heart failure ([place]) 02/05/2017  . Arthritis   . AVM (arteriovenous malformation) of colon   . AVM (arteriovenous malformation) of small bowel, acquired with hemorrhage 01/20/2012   Found on small bowel capsule endoscopy, June 2013   . Carotid artery disease ([place])    a. Carotid dopple 03/2014 29-79% RICA &  89-21% LICA stenosis b. 1/94  . CKD (chronic kidney disease), stage III   . Clostridium difficile colitis 05/13/2016  . Coronary artery disease involving native coronary artery of native heart without angina pectoris 07/31/2016   Lopressor - d/c 7/19, Lipitor and ASA  . GERD (gastroesophageal reflux disease)   . Gout   . H/O transfusion of whole blood   . Hx of adenomatous colonic polyps 07/02/2016  . Hyperlipidemia   . Hypertension   . Hypothyroidism   . Iron deficiency anemia   . Nonrheumatic aortic valve stenosis   . NSTEMI (non-ST elevated myocardial infarction) ([place])    hx/notes 06/25/2017  . PAD (peripheral artery disease) ([place]) 07/31/2016   On ASA, Plavix  . Personal history of colonic polyps 2007, 2008   adenoma each time, largest 12 mm in 2007  . Plaque psoriasis     . Psoriasis   . Septic phlebitis of upper extremity 08/26/2016   2018 L hand Dr Megan Salon f/u Keflex and Vanc po  . Staph infection 07/2016   left hand   . Subclavian artery stenosis, right ([place]) 01/16/2015  . Type II diabetes mellitus ([place])    TYPE 2    Past Surgical History:  Procedure Laterality Date  . APPENDECTOMY  02/09/2014  . CARDIAC CATHETERIZATION N/A 07/29/2016   Procedure: Right/Left Heart Cath and Coronary Angiography;  Surgeon: Nelva Bush, MD;  Location: [facility];  Service: Cardiovascular;  Laterality: N/A;  . CARDIAC CATHETERIZATION N/A 07/29/2016   Procedure: Coronary Stent Intervention;  Surgeon: Nelva Bush, MD;  Location: [facility];  Service: Cardiovascular;  Laterality: N/A;  . CARDIAC CATHETERIZATION  10/2014  . COLONOSCOPY  02/10/2011   internal hemorrhoids  . COLONOSCOPY W/ POLYPECTOMY  04/09/2006   12 mm adenoma  . COLONOSCOPY W/ POLYPECTOMY  06/17/2007   5 mm adenoma  . COLONOSCOPY WITH PROPOFOL N/A 05/13/2016   Procedure: COLONOSCOPY WITH PROPOFOL;  Surgeon: Manus Gunning, MD;  Location: [facility];  Service: Gastroenterology;  Laterality: N/A;  . ENTEROSCOPY N/A 08/14/2016   Procedure: ENTEROSCOPY;  Surgeon: Manus Gunning, MD;  Location: [facility] END[facility]COPY;  Service: Gastroenterology;  Laterality: N/A;  . ESOPHAGOGASTRODUODENOSCOPY  12/23/2011   Procedure: ESOPHAGOGASTRODUODENOSCOPY (EGD);  Surgeon: Irene Shipper, MD;  Location: [facility];  Service: Endoscopy;  Laterality: N/A;  with small bowel bx's  . GIVENS CAPSULE STUDY  12/28/2011  . GIVENS CAPSULE STUDY N/A 08/14/2016   Procedure: GIVENS CAPSULE STUDY;  Surgeon: Manus Gunning, MD;  Location: [facility];  Service: Gastroenterology;  Laterality: N/A;  . KNEE ARTHROSCOPY Right   . LAPAROSCOPIC APPENDECTOMY N/A 02/09/2014   Procedure: APPENDECTOMY LAPAROSCOPIC;  Surgeon: Leighton Ruff, MD;  Location: [facility];  Service: General;  Laterality: N/A;  . LEFT HEART  CATHETERIZATION WITH CORONARY ANGIOGRAM N/A 11/15/2014   Procedure: LEFT HEART CATHETERIZATION WITH CORONARY ANGIOGRAM;  Surgeon: Belva Crome, MD;  Location: [facility] C[facility] LAB;  Service: Cardiovascular;  Laterality: N/A;  . SHOULDER OPEN ROTATOR CUFF REPAIR Left     Family History  Problem Relation Age of Onset  . Heart attack Father   . Lung cancer Father   . Diabetes Father   . Stroke Father   . Hypertension Father   . Stroke Mother   . Hypertension Mother   . Cancer Brother        liver  . Heart disease Brother        chf  . COPD Sister   . Malignant hyperthermia Neg Hx     Social History   Socioeconomic History  . Marital status: Married    Spouse name: Not on file  . Number of children: Not on file  . Years of education: 51  . Highest education level: Not on file  Occupational History  . Occupation:  Retired  Tobacco Use  . Smoking status: Former Smoker    Packs/day: 0.70    Years: 48.00    Pack years: 33.60    Types: Cigarettes    Quit date: 03/29/2006    Years since quitting: 13.4  . Smokeless tobacco: Never Used  Substance and Sexual Activity  . Alcohol use: Yes    Alcohol/week: 1.0 standard drinks    Types: 1 Cans of beer per week    Comment: occasional alcohol intake  . Drug use: No  . Sexual activity: Not Currently  Other Topics Concern  . Not on file  Social History Narrative   Regular exercise-no   Caffeine Use-yes   Social Determinants of Health   Financial Resource Strain:   . Difficulty of Paying Living Expenses: Not on file  Food Insecurity:   . Worried About Charity fundraiser in the Last Year: Not on file  . Ran Out of Food in the Last Year: Not on file  Transportation Needs:   . Lack of Transportation (Medical): Not on file  . Lack of Transportation (Non-Medical): Not on file  Physical Activity:   . Days of Exercise per Week: Not on file  . Minutes of Exercise per Session: Not on file  Stress:   . Feeling of Stress : Not on file  Social  Connections:   . Frequency of Communication with Friends and Family: Not on file  . Frequency of Social Gatherings with Friends and Family: Not on file  . Attends Religious Services: Not on file  . Active Member of Clubs or Organizations: Not on file  . Attends Archivist Meetings: Not on file  . Marital Status: Not on file  Intimate Partner Violence:   . Fear of Current or Ex-Partner: Not on file  . Emotionally Abused: Not on file  . Physically Abused: Not on file  . Sexually Abused: Not on file     Physical Exam   Vitals:   09/19/2019 1215 09/06/2019 1230  BP: 132/60 (!) 119/43  Pulse: 64 62  Resp: 10 17  Temp:    SpO2: 99% 100%    CONSTITUTIONAL: Chronically ill-appearing, NAD NEURO:  Alert and oriented x 3, no focal deficits EYES:  eyes equal and reactive ENT/NECK:  no LAD, no JVD CARDIO: Regular rate, well-perfused, normal S1 and S2 PULM:  CTAB no wheezing or rhonchi, mildly tachypneic GI/GU:  normal bowel sounds, non-distended, non-tender MSK/SPINE:  No gross deformities, no edema SKIN:  no rash, atraumatic PSYCH:  Appropriate speech and behavior  *Additional and/or pertinent findings included in MDM below  Diagnostic and Interventional Summary    EKG Interpretation  Date/Time:  Monday August 28 2019 11:03:27 EST Ventricular Rate:  72 PR Interval:    QRS Duration: 113 QT Interval:  453 QTC Calculation: 496 R Axis:   34 Text Interpretation: Sinus rhythm Incomplete left bundle branch block Borderline prolonged QT interval Confirmed by Gerlene Fee [phone]) on 09/09/2019 12:43:57 PM      Cardiac Monitoring Interpretation:  Labs Reviewed  CBC WITH DIFFERENTIAL/PLATELET - Abnormal; Notable for the following components:      Result Value   RBC 3.18 (*)    Hemoglobin 8.6 (*)    HCT 27.9 (*)    RDW 17.2 (*)    Lymphs Abs 0.3 (*)    All other components within normal limits  COMPREHENSIVE METABOLIC PANEL - Abnormal; Notable for the following  components:   Glucose, Bld 200 (*)    BUN 48 (*)    Creatinine, Ser 2.38 (*)    Calcium 7.6 (*)    Total Protein 6.4 (*)    Albumin 2.8 (*)    GFR calc non Af Amer 26 (*)    GFR calc Af Amer 30 (*)    All other components within normal limits  D-DIMER, QUANTITATIVE (NOT AT [facility]) - Abnormal; Notable for the following components:   D-Dimer, Quant 1.76 (*)    All other components within normal limits  LACTATE DEHYDROGENASE - Abnormal; Notable for the following components:   LDH 378 (*)    All other components within normal limits  FERRITIN - Abnormal; Notable for the following components:   Ferritin 778 (*)    All other components within normal limits  FIBRINOGEN - Abnormal; Notable for the following components:   Fibrinogen 572 (*)    All other components within normal limits  C-REACTIVE PROTEIN - Abnormal; Notable for the following components:   CRP 8.2 (*)    All other components within normal limits  BRAIN NATRIURETIC PEPTIDE - Abnormal; Notable for the following components:   B Natriuretic Peptide 345.2 (*)    All other components within normal limits  POC OCCULT BLOOD, ED - Abnormal; Notable for the following components:   Fecal Occult Bld POSITIVE (*)    All other components within normal limits  CULTURE, BLOOD (ROUTINE X 2)  CULTURE, BLOOD (ROUTINE X 2)  RESPIRATORY PANEL BY RT PCR (FLU A&B, COVID)  LACTIC ACID, PLASMA  PROCALCITONIN  TRIGLYCERIDES  POC SARS CORONAVIRUS 2 AG -  ED  TYPE AND SCREEN    DG Chest Port 1 View  Final Result      Medications  dexamethasone (DECADRON) injection 6 mg (6 mg Intravenous Given 09/15/2019 1112)     Procedures  /  Critical Care Procedures  ED Course and Medical Decision Making  I have reviewed the triage vital signs, the nursing notes, and pertinent available records from the EMR.  Pertinent labs & imaging  results that were available during my care of the patient were reviewed by me and considered in my medical decision  making (see below for details).     Suspect COVID-19 versus CHF exacerbation versus GI bleed with symptomatic anemia.  Labs reveal downtrending hemoglobin, patient is Hemoccult positive though there was bright red blood on the rectal exam and there was not evidence of black stool or melena.  Chest x-ray consistent with multifocal pneumonia, awaiting formal Covid testing, will admit to hospitalist service.  Patient requiring 1 or 2 L nasal cannula to maintain oxygen saturations in the 90s.  Barth Kirks. Sedonia Small, [place] [email or web address]  Final Clinical Impressions(s) / ED Diagnoses     ICD-10-CM   1. Gastrointestinal hemorrhage, unspecified gastrointestinal hemorrhage type  K92.2   2. Suspected COVID-19 virus infection  Z20.822   3. Acute respiratory failure with hypoxia (HCC)  J96.01     ED Discharge Orders    None       Discharge Instructions Discussed with and Provided to Patient:   Discharge Instructions   None       Maudie Flakes, MD 09/13/2019 1345

## 2019-08-28 NOTE — ED Notes (Signed)
Wife states pts bowels are black.stage 3-4 kidney disease.  Wife- Masiyah Engen [phone]

## 2019-08-28 NOTE — Consult Note (Signed)
Referring Provider: No ref. provider found Primary Care Physician:  Cassandria Anger, MD Primary Gastroenterologist:  Dr. [name]  Reason for Consultation:  Anemia  HPI: Michael [name] is a 79 y.o. male with a past medical history of hypertension, hyperlipidemia, coronary artery disease, NSTEMI 61/6073, diastolic heart failure, paroxysmal atrial fibrillation on ASA, anemia, carotid artery disease, diabetes mellitus type 2, CKD III, hypothyroidism, gout, GERD, GI bleed on Eliquis 2017, small bowel and colon AVMs and colon polyps.  He presented to [facility] long ED today with complaints of cough, fatigue and decreased appetite for the past few days. His Hg was 8.6 (down from 9.2 on 06/28/2019, Hg 11.2 on 7/20). Stool FOBT positive. A GI consult was requested for further evaluation.   He denies having any dysphagia, heartburn or upper abdominal pain.  No lower abdominal pain.  He reports passing a normal formed bowel movement every few days.  His last bowel movement was 1 or 2 days ago.  He denies having any rectal bleeding or melena.  He takes aspirin 81 mg daily.  He denies taking any NSAIDs. He denies being on any anticoagulants.  He continues to have shortness of breath.  He reports coughing clear phlegm-like secretions.  No hemoptysis.  Complains of having sweats without an obvious fever while at home.  No weight loss.  Has noticed decreased urine output for the past few days.  No hematuria. Past smoker. He drinks 1 beer weekly.  Brother with history of  liver cancer.  No family history of colorectal cancer.  ED course: Sodium 138.  Potassium 4.0.  Glucose 200.  BUN 48.  Creatinine 2.38. (base line cr 2.60 06/27/2019).  Calcium 7.6.  Anion gap 10.  Alk phos 61.  Albumin 2.8.  AST 39.  ALT 23.  Total bili 1.0.  BNP 345.2.  LDH 378.  Iron 22.  TIBC 237.  Ferritin 814.  WBC 5.6.  Hemoglobin 8.6.  Hematocrit 27.9.  MCV 87.7.  Platelet 224.  D-dimer 1.76.  SARS coronavirus 2 by RT PCR positive at 12:44  PM.   Chest X-ray: New right greater than left mixed interstitial and airspace disease, suggesting multifocal pneumonia.  GI procedure History:  Small Bowel Capsule endoscopy 08/19/2016:  -Multiple Small bowel AVMs to the mid small bowel. No active bleeding.   Small bowel enteroscopy  08/14/2016:  - Esophagogastric landmarks identified. - Mucosal changes in the esophagus. Biopsied. - Normal stomach. - Normal examined duodenum. - The examined portion of the jejunum was normal.  Colonoscopy 06/25/2016:  - One 18 mm tubular adenomatous polyp in the ascending colon, removed with a hot snare. Resected and retrieved. Clips (MR conditional) were placed given need for eliquis - One 2 mm tubular adenomatous polyp in the cecum, removed with a cold biopsy forceps. Resected and retrieved. - The examination was otherwise normal on direct and retroflexion views.  Colonoscopy 05/13/2016 done due to GI bleed on Eliquis:  - Non-thrombosed external hemorrhoids found on perianal exam. - One large polyp in the distal ascending colon. - Two 4 to 7 mm polyps in the ascending colon. - A few 3 to 6 mm polyps in the sigmoid colon. - The examined portion of the ileum was normal. - Non-bleeding large inflamed internal hemorrhoids. - The examination was otherwise normal. - Polyps were not removed as he was on Eliquis.    ECHO 01/04/2019:  LV EF 60 - 65%. Mild moderate aortic stenosis  IVC dilated > 50%  Past Medical History:  Diagnosis Date  . Acute on chronic diastolic (congestive) heart failure ([place]) 02/05/2017  . Arthritis   . AVM (arteriovenous malformation) of colon   . AVM (arteriovenous malformation) of small bowel, acquired with hemorrhage 01/20/2012   Found on small bowel capsule endoscopy, June 2013   . Carotid artery disease ([place])    a. Carotid dopple 03/2014 81-44% RICA &  81-85% LICA stenosis b. 6/31  . CKD (chronic kidney disease), stage III   . Clostridium difficile colitis 05/13/2016    . Coronary artery disease involving native coronary artery of native heart without angina pectoris 07/31/2016   Lopressor - d/c 7/19, Lipitor and ASA  . GERD (gastroesophageal reflux disease)   . Gout   . H/O transfusion of whole blood   . Hx of adenomatous colonic polyps 07/02/2016  . Hyperlipidemia   . Hypertension   . Hypothyroidism   . Iron deficiency anemia   . Nonrheumatic aortic valve stenosis   . NSTEMI (non-ST elevated myocardial infarction) ([place])    hx/notes 06/25/2017  . PAD (peripheral artery disease) ([place]) 07/31/2016   On ASA, Plavix  . Personal history of colonic polyps 2007, 2008   adenoma each time, largest 12 mm in 2007  . Plaque psoriasis   . Psoriasis   . Septic phlebitis of upper extremity 08/26/2016   2018 L hand Dr Megan Salon f/u Keflex and Vanc po  . Staph infection 07/2016   left hand   . Subclavian artery stenosis, right ([place]) 01/16/2015  . Type II diabetes mellitus ([place])    TYPE 2    Past Surgical History:  Procedure Laterality Date  . APPENDECTOMY  02/09/2014  . CARDIAC CATHETERIZATION N/A 07/29/2016   Procedure: Right/Left Heart Cath and Coronary Angiography;  Surgeon: Nelva Bush, MD;  Location: [place] CV LAB;  Service: Cardiovascular;  Laterality: N/A;  . CARDIAC CATHETERIZATION N/A 07/29/2016   Procedure: Coronary Stent Intervention;  Surgeon: Nelva Bush, MD;  Location: [place] CV LAB;  Service: Cardiovascular;  Laterality: N/A;  . CARDIAC CATHETERIZATION  10/2014  . COLONOSCOPY  02/10/2011   internal hemorrhoids  . COLONOSCOPY W/ POLYPECTOMY  04/09/2006   12 mm adenoma  . COLONOSCOPY W/ POLYPECTOMY  06/17/2007   5 mm adenoma  . COLONOSCOPY WITH PROPOFOL N/A 05/13/2016   Procedure: COLONOSCOPY WITH PROPOFOL;  Surgeon: Manus Gunning, MD;  Location: [facility] ENDOSCOPY;  Service: Gastroenterology;  Laterality: N/A;  . ENTEROSCOPY N/A 08/14/2016   Procedure: ENTEROSCOPY;  Surgeon: Manus Gunning, MD;  Location[facility] ENDOSCOPY;   Service: Gastroenterology;  Laterality: N/A;  . ESOPHAGOGASTRODUODENOSCOPY  12/23/2011   Procedure: ESOPHAGOGASTRODUODENOSCOPY (EGD);  Surgeon: Irene Shipper, MD;  Location: [facility] ENDOSCOPY;  Service: Endoscopy;  Laterality: N/A;  with small bowel bx's  . GIVENS CAPSULE STUDY  12/28/2011  . GIVENS CAPSULE STUDY N/A 08/14/2016   Procedure: GIVENS CAPSULE STUDY;  Surgeon: Manus Gunning, MD;  Location: [place];  Service: Gastroenterology;  Laterality: N/A;  . KNEE ARTHROSCOPY Right   . LAPAROSCOPIC APPENDECTOMY N/A 02/09/2014   Procedure: APPENDECTOMY LAPAROSCOPIC;  Surgeon: Leighton Ruff, MD;  Location: [facility] ORS;  Service: General;  Laterality: N/A;  . LEFT HEART CATHETERIZATION WITH CORONARY ANGIOGRAM N/A 11/15/2014   Procedure: LEFT HEART CATHETERIZATION WITH CORONARY ANGIOGRAM;  Surgeon: Belva Crome, MD;  Location: [facility] CATH LAB;  Service: Cardiovascular;  Laterality: N/A;  . SHOULDER OPEN ROTATOR CUFF REPAIR Left     Prior to Admission medications   Medication Sig Start Date End Date Taking? Authorizing Provider  allopurinol (ZYLOPRIM) 100 MG tablet Take 2 tablets (235m total) by mouth daily Patient taking differently: Take 200 mg by mouth daily. Take 2 tablets (2079mtotal) by mouth daily 05/09/19  Yes [name], AlEvie LacksMD  amiodarone (PACERONE) 200 MG tablet TAKE 1 TABLET BY MOUTH ONCE DAILY EXCEPT  FOR  SUNDAY Patient taking differently: Take 200 mg by mouth See admin instructions. Take daily EXCEPT: Sunday 05/09/19  Yes NiJosue HectorMD  aspirin 81 MG chewable tablet Chew 1 tablet (81 mg total) by mouth daily. 04/28/17  Yes [name], AlEvie LacksMD  atorvastatin (LIPITOR) 40 MG tablet Take 1 tablet (40 mg total) by mouth daily. Patient taking differently: Take 40 mg by mouth daily at 6 PM.  08/21/19  Yes [name], AlEvie LacksMD  b complex vitamins tablet Take 1 tablet by mouth daily. 04/14/17  Yes [name], AlEvie LacksMD  CARTIA XT 180 MG 24 hr capsule Take 1 capsule by mouth  once daily Patient taking differently: Take 180 mg by mouth daily.  06/06/19  Yes NiJosue HectorMD  Cholecalciferol (VITAMIN D-3) 1000 units CAPS Take 1,000-5,000 Units by mouth See admin instructions. 1,000 units once a day on Sun/Mon/Tues/Wed/Thurs/Fri and 5,000 units on Sat   Yes [provider]  fenofibrate 160 MG tablet Take 1 tablet by mouth daily Patient taking differently: Take 160 mg by mouth daily.  06/15/19  Yes NiJosue HectorMD  ferrous sulfate 325 (65 FE) MG tablet Take 325 mg by mouth 3 (three) times daily with meals.   Yes [provider]  furosemide (LASIX) 40 MG tablet Take 1 tablet (40 mg total) by mouth 2 (two) times daily. 06/06/19  Yes NiJosue HectorMD  gabapentin (NEURONTIN) 300 MG capsule Take 1 capsule (300 mg total) by mouth 3 (three) times daily. 04/20/19  Yes [name], AlEvie LacksMD  halobetasol (ULTRAVATE) 0.05 % cream Apply 1 application topically 2 (two) times daily. 07/15/19  Yes [provider]  hydrALAZINE (APRESOLINE) 50 MG tablet Take 1 tablet (50 mg total) by mouth 3 (three) times daily. 08/21/19  Yes [name], AlEvie LacksMD  Insulin Isophane & Regular Human (NOVOLIN 70/30 FLEXPEN RELION) (70-30) 100 UNIT/ML PEN Inject 50 Units into the skin 2 (two) times daily with a meal. Patient taking differently: Inject 50 Units into the skin 2 (two) times daily.  05/10/19  Yes [name], AlEvie LacksMD  levothyroxine (SYNTHROID) 150 MCG tablet Take 1 tablet (150 mcg total) by mouth daily before breakfast. 06/15/19  Yes [name], AlEvie LacksMD  lisinopril (ZESTRIL) 5 MG tablet Take 5 mg by mouth at bedtime. 06/13/19  Yes [provider]  metoprolol tartrate (LOPRESSOR) 25 MG tablet Take 0.5 tablets (12.5 mg total) by mouth 2 (two) times daily. 02/01/19  Yes InIsaiah SergeNP  nitroGLYCERIN (NITROSTAT) 0.4 MG SL tablet Place 1 tablet under tongue for chest pain. Repeat every 5 minutes as needed, up to 3 doses. If no relief after 3  doses, call 911. Patient taking differently: Place 0.4 mg under the tongue every 5 (five) minutes as needed for chest pain.  03/18/18  Yes NiJosue HectorMD  Secukinumab (COSENTYX) 150 MG/ML SOSY Inject 150 mg into the skin every 30 (thirty) days. Inject 30020m2 pens) subcutaneously every 4 weeks    Yes [provider]  Blood Glucose Monitoring Suppl (ONE TOUCH ULTRA 2) W/DEVICE KIT Use as directed Dx E11.9 05/18/14   [name], AleEvie LacksD  Blood Pressure  Monitor KIT Use to check blood pressure daily Dx I10 10/04/15   [name], Evie Lacks, MD  glucose blood (ONETOUCH ULTRA) test strip CHECK BLOOD GLUCOSE (SUGAR) 4 TIMES DAILY DX: E11.9, Z79.4 (pt on insulin) 12/28/18   [name], Evie Lacks, MD  Insulin Pen Needle 32G X 4 MM MISC Use to administer insulin 09/16/18   [name], Evie Lacks, MD  Lancets (ONETOUCH ULTRASOFT) lancets Use to check blood sugars daily 05/18/14   [name], Evie Lacks, MD  NEEDLE, DISP, 30 G (BD DISP NEEDLES) 30G X 1/2" MISC USE WITH 1 SYRINGE SUBCUTANEOUSLY  TWICE DAILY 09/15/18   [name], Evie Lacks, MD    Current Facility-Administered Medications  Medication Dose Route Frequency Provider Last Rate Last Admin  . [START ON 08/29/2019] allopurinol (ZYLOPRIM) tablet 200 mg  200 mg Oral Daily Shelly Coss, MD      . Derrill Memo ON 08/29/2019] amiodarone (PACERONE) tablet 200 mg  200 mg Oral Once per day on Mon Tue Wed Thu Fri Sat Shelly Coss, MD      . atorvastatin (LIPITOR) tablet 40 mg  40 mg Oral Daily Shelly Coss, MD      . Derrill Memo ON 08/29/2019] diltiazem (CARDIZEM CD) 24 hr capsule 180 mg  180 mg Oral Daily Shelly Coss, MD      . Derrill Memo ON 08/29/2019] fenofibrate tablet 160 mg  160 mg Oral Daily Adhikari, Amrit, MD      . ferrous sulfate tablet 325 mg  325 mg Oral TID WC Adhikari, Amrit, MD      . ferumoxytol (FERAHEME) 510 mg in sodium chloride 0.9 % 100 mL IVPB  510 mg Intravenous Once Shelly Coss, MD      . Derrill Memo ON 08/29/2019] levothyroxine  (SYNTHROID) tablet 150 mcg  150 mcg Oral QAC breakfast Adhikari, Amrit, MD      . metoprolol tartrate (LOPRESSOR) tablet 12.5 mg  12.5 mg Oral BID Shelly Coss, MD       Current Outpatient Medications  Medication Sig Dispense Refill  . allopurinol (ZYLOPRIM) 100 MG tablet Take 2 tablets (253m total) by mouth daily (Patient taking differently: Take 200 mg by mouth daily. Take 2 tablets (2026mtotal) by mouth daily) 60 tablet 5  . amiodarone (PACERONE) 200 MG tablet TAKE 1 TABLET BY MOUTH ONCE DAILY EXCEPT  FOR  SUNDAY (Patient taking differently: Take 200 mg by mouth See admin instructions. Take daily EXCEPT: Sunday) 90 tablet 3  . aspirin 81 MG chewable tablet Chew 1 tablet (81 mg total) by mouth daily. 90 tablet 1  . atorvastatin (LIPITOR) 40 MG tablet Take 1 tablet (40 mg total) by mouth daily. (Patient taking differently: Take 40 mg by mouth daily at 6 PM. ) 90 tablet 2  . b complex vitamins tablet Take 1 tablet by mouth daily. 100 tablet 3  . CARTIA XT 180 MG 24 hr capsule Take 1 capsule by mouth once daily (Patient taking differently: Take 180 mg by mouth daily. ) 90 capsule 2  . Cholecalciferol (VITAMIN D-3) 1000 units CAPS Take 1,000-5,000 Units by mouth See admin instructions. 1,000 units once a day on Sun/Mon/Tues/Wed/Thurs/Fri and 5,000 units on Sat    . fenofibrate 160 MG tablet Take 1 tablet by mouth daily (Patient taking differently: Take 160 mg by mouth daily. ) 30 tablet 9  . ferrous sulfate 325 (65 FE) MG tablet Take 325 mg by mouth 3 (three) times daily with meals.    . furosemide (LASIX) 40 MG tablet Take 1 tablet (40 mg total)  by mouth 2 (two) times daily. 180 tablet 2  . gabapentin (NEURONTIN) 300 MG capsule Take 1 capsule (300 mg total) by mouth 3 (three) times daily. 270 capsule 3  . halobetasol (ULTRAVATE) 0.05 % cream Apply 1 application topically 2 (two) times daily.    . hydrALAZINE (APRESOLINE) 50 MG tablet Take 1 tablet (50 mg total) by mouth 3 (three) times daily. 90  tablet 11  . Insulin Isophane & Regular Human (NOVOLIN 70/30 FLEXPEN RELION) (70-30) 100 UNIT/ML PEN Inject 50 Units into the skin 2 (two) times daily with a meal. (Patient taking differently: Inject 50 Units into the skin 2 (two) times daily. ) 30 mL 11  . levothyroxine (SYNTHROID) 150 MCG tablet Take 1 tablet (150 mcg total) by mouth daily before breakfast. 90 tablet 3  . lisinopril (ZESTRIL) 5 MG tablet Take 5 mg by mouth at bedtime.    . metoprolol tartrate (LOPRESSOR) 25 MG tablet Take 0.5 tablets (12.5 mg total) by mouth 2 (two) times daily. 30 tablet 6  . nitroGLYCERIN (NITROSTAT) 0.4 MG SL tablet Place 1 tablet under tongue for chest pain. Repeat every 5 minutes as needed, up to 3 doses. If no relief after 3 doses, call 911. (Patient taking differently: Place 0.4 mg under the tongue every 5 (five) minutes as needed for chest pain. ) 25 tablet 11  . Secukinumab (COSENTYX) 150 MG/ML SOSY Inject 150 mg into the skin every 30 (thirty) days. Inject 381m (2 pens) subcutaneously every 4 weeks     . Blood Glucose Monitoring Suppl (ONE TOUCH ULTRA 2) W/DEVICE KIT Use as directed Dx E11.9 1 each 0  . Blood Pressure Monitor KIT Use to check blood pressure daily Dx I10 1 each 0  . glucose blood (ONETOUCH ULTRA) test strip CHECK BLOOD GLUCOSE (SUGAR) 4 TIMES DAILY DX: E11.9, Z79.4 (pt on insulin) 400 each 3  . Insulin Pen Needle 32G X 4 MM MISC Use to administer insulin 100 each 11  . Lancets (ONETOUCH ULTRASOFT) lancets Use to check blood sugars daily 30 each 11  . NEEDLE, DISP, 30 G (BD DISP NEEDLES) 30G X 1/2" MISC USE WITH 1 SYRINGE SUBCUTANEOUSLY  TWICE DAILY 100 each 11    Allergies as of 09/20/2019 - Review Complete 09/17/2019  Allergen Reaction Noted  . Percocet [oxycodone-acetaminophen] Nausea And Vomiting 01/27/2011  . Ciprofloxacin Other (See Comments) 01/31/2019  . Metformin and related Nausea Only 03/17/2016  . Invokana [canagliflozin] Rash 03/17/2016    Family History  Problem  Relation Age of Onset  . Heart attack Father   . Lung cancer Father   . Diabetes Father   . Stroke Father   . Hypertension Father   . Stroke Mother   . Hypertension Mother   . Cancer Brother        liver  . Heart disease Brother        chf  . COPD Sister   . Malignant hyperthermia Neg Hx     Social History   Socioeconomic History  . Marital status: Married    Spouse name: Not on file  . Number of children: Not on file  . Years of education: 155 . Highest education level: Not on file  Occupational History  . Occupation:  Retired  Tobacco Use  . Smoking status: Former Smoker    Packs/day: 0.70    Years: 48.00    Pack years: 33.60    Types: Cigarettes    Quit date: 03/29/2006    Years since  quitting: 13.4  . Smokeless tobacco: Never Used  Substance and Sexual Activity  . Alcohol use: Yes    Alcohol/week: 1.0 standard drinks    Types: 1 Cans of beer per week    Comment: occasional alcohol intake  . Drug use: No  . Sexual activity: Not Currently  Other Topics Concern  . Not on file  Social History Narrative   Regular exercise-no   Caffeine Use-yes   Social Determinants of Health   Financial Resource Strain:   . Difficulty of Paying Living Expenses: Not on file  Food Insecurity:   . Worried About Charity fundraiser in the Last Year: Not on file  . Ran Out of Food in the Last Year: Not on file  Transportation Needs:   . Lack of Transportation (Medical): Not on file  . Lack of Transportation (Non-Medical): Not on file  Physical Activity:   . Days of Exercise per Week: Not on file  . Minutes of Exercise per Session: Not on file  Stress:   . Feeling of Stress : Not on file  Social Connections:   . Frequency of Communication with Friends and Family: Not on file  . Frequency of Social Gatherings with Friends and Family: Not on file  . Attends Religious Services: Not on file  . Active Member of Clubs or Organizations: Not on file  . Attends Archivist  Meetings: Not on file  . Marital Status: Not on file  Intimate Partner Violence:   . Fear of Current or Ex-Partner: Not on file  . Emotionally Abused: Not on file  . Physically Abused: Not on file  . Sexually Abused: Not on file    Review of Systems: Gen: + sweats. No weight loss.   CV: Denies chest pain, palpitations or edema. Resp: See HPI.  GI: Denies heartburn, dysphagia, stomach or lower abdominal pain. No diarrhea or constipation. No rectal bleeding or melena.   GU : + Decreased urination.Denies urinary burning, blood in urine, increased urinary frequency or incontinence. MS: Denies joint pain, muscles aches or weakness. Derm: Denies rash, itchiness, skin lesions or unhealing ulcers. Psych: Denies depression, anxiety, memory loss, suicidal ideation or confusion. Heme: Denies easy bruising, bleeding. Neuro:  Denies headaches, dizziness or paresthesias. Endo:  + DM II.   Physical Exam: Vital signs in last 24 hours: Temp:  [98.3 F (36.8 C)] 98.3 F (36.8 C) (02/01 1111) Pulse Rate:  [62-70] 67 (02/01 1445) Resp:  [10-18] 18 (02/01 1445) BP: (108-142)/(42-63) 124/42 (02/01 1445) SpO2:  [92 %-100 %] 93 % (02/01 1445)   General:  Alert, ill appearing 79 year old male. Head:  Normocephalic and atraumatic. Eyes:  No scleral icterus. Conjunctiva pink. Ears:  Normal auditory acuity. Nose:  No deformity, discharge or lesions. Mouth: No deformity or lesions.  Dentition. Neck:  Supple. No lymphadenopathy or thyromegaly.  Lungs: Diminished breath sounds throughout. Heart: Regular rate and rhythm, no murmurs. Abdomen: Soft, distended, nontender.  Positive bowel sounds all 4 quadrants.  No HSM. Rectal: Moderate internal hemorrhoids palpated with tenderness to the posterior anal area without obvious fissure, a scant amount of pink-reddish blood on the exam glove.  No stool in the rectal vault.  Prostate enlarged. Msk:  Symmetrical without gross deformities.  Pulses:  Normal pulses  noted. Extremities:  Without clubbing or edema. Neurologic:  Alert and  oriented x4. No focal deficits.  Skin:  Intact without significant lesions or rashes. Psych:  Alert and cooperative. Normal mood and affect.  Intake/Output from  previous day: No intake/output data recorded. Intake/Output this shift: No intake/output data recorded.  Lab Results: Recent Labs    09/22/2019 1113  WBC 5.6  HGB 8.6*  HCT 27.9*  PLT 224   BMET Recent Labs    09/06/2019 1113  NA 138  K 4.0  CL 106  CO2 22  GLUCOSE 200*  BUN 48*  CREATININE 2.38*  CALCIUM 7.6*   LFT Recent Labs    09/05/2019 1113  PROT 6.4*  ALBUMIN 2.8*  AST 39  ALT 23  ALKPHOS 61  BILITOT 1.0   PT/INR No results for input(s): LABPROT, INR in the last 72 hours. Hepatitis Panel No results for input(s): HEPBSAG, HCVAB, HEPAIGM, HEPBIGM in the last 72 hours.    Studies/Results: DG Chest Port 1 View  Result Date: 09/24/2019 CLINICAL DATA:  79 year old male with a history of shortness of breath EXAM: PORTABLE CHEST 1 VIEW COMPARISON:  01/31/2011 FINDINGS: Cardiomediastinal silhouette unchanged in size and contour. Low lung volumes with new mixed interstitial and airspace disease of the right lung, and to a lesser degree the lower left lung. No pneumothorax or large pleural effusion. IMPRESSION: New right greater than left mixed interstitial and airspace disease, suggesting multifocal pneumonia. Electronically Signed   By: Corrie Mckusick D.O.   On: 09/20/2019 11:40    IMPRESSION/PLAN:  32.  79 year old male with acute on chronic anemia.  Past history of small bowel AVMs and colon polyps.  GI bleed in 2017 most likely hemorrhoidal etiology. [facility] ED with fatigue and SOB. Found to be anemic. Hg 8.6. ( Hg 9.2 on 06/28/2019).  HCT 27.9. BUN 48. FOBT positive. Rectal exam identified a scant amount of pink-red blood in the rectum with inflamed internal hemorrhoids.  No evidence of gross active hemorrhage. -Repeat CBC in  the a.m. -Need to monitor patient for active GI bleeding -Anusol HC suppository 25 mg 1 PR nightly x3 nights -Colonoscopy evaluation deferred at this time, respiratory status currently compromised with positive Covid 19 pneumonia -Feraheme already ordered -Reduce Ferrous Sulfate 329m QD -To re-evaluate GI status in am  2.  SARS COVID-19 pneumonia.  Patient is on oxygen 2 L nasal cannula.  Remdesivir ordered.  -Management per the hospitalist team  3.  History of CAD, paroxysmal atrial fibrillation, CHF on ASA.   4.  Diabetes mellitus type 2  Further recommendations per Dr. MCorena PilgrimMDorathy Daft 09/20/2019, 3:17 PM

## 2019-08-28 NOTE — ED Notes (Signed)
ED Provider at bedside. 

## 2019-08-29 DIAGNOSIS — Z8774 Personal history of (corrected) congenital malformations of heart and circulatory system: Secondary | ICD-10-CM

## 2019-08-29 DIAGNOSIS — K922 Gastrointestinal hemorrhage, unspecified: Secondary | ICD-10-CM

## 2019-08-29 DIAGNOSIS — Z20822 Contact with and (suspected) exposure to covid-19: Secondary | ICD-10-CM

## 2019-08-29 DIAGNOSIS — U071 COVID-19: Principal | ICD-10-CM

## 2019-08-29 DIAGNOSIS — D649 Anemia, unspecified: Secondary | ICD-10-CM

## 2019-08-29 DIAGNOSIS — K5521 Angiodysplasia of colon with hemorrhage: Secondary | ICD-10-CM

## 2019-08-29 DIAGNOSIS — E1151 Type 2 diabetes mellitus with diabetic peripheral angiopathy without gangrene: Secondary | ICD-10-CM

## 2019-08-29 DIAGNOSIS — I5032 Chronic diastolic (congestive) heart failure: Secondary | ICD-10-CM

## 2019-08-29 LAB — BLOOD CULTURE ID PANEL (REFLEXED)

## 2019-08-29 LAB — CBC WITH DIFFERENTIAL/PLATELET
Abs Immature Granulocytes: 0.05 10*3/uL (ref 0.00–0.07)
Basophils Absolute: 0 10*3/uL (ref 0.0–0.1)
Basophils Relative: 0 %
Eosinophils Absolute: 0 10*3/uL (ref 0.0–0.5)
Eosinophils Relative: 0 %
HCT: 25.6 % — ABNORMAL LOW (ref 39.0–52.0)
Hemoglobin: 7.9 g/dL — ABNORMAL LOW (ref 13.0–17.0)
Immature Granulocytes: 1 %
Lymphocytes Relative: 7 %
Lymphs Abs: 0.4 10*3/uL — ABNORMAL LOW (ref 0.7–4.0)
MCH: 27.3 pg (ref 26.0–34.0)
MCHC: 30.9 g/dL (ref 30.0–36.0)
MCV: 88.6 fL (ref 80.0–100.0)
Monocytes Absolute: 0.3 10*3/uL (ref 0.1–1.0)
Monocytes Relative: 5 %
Neutro Abs: 4.4 10*3/uL (ref 1.7–7.7)
Neutrophils Relative %: 87 %
Platelets: 251 10*3/uL (ref 150–400)
RBC: 2.89 MIL/uL — ABNORMAL LOW (ref 4.22–5.81)
RDW: 16.9 % — ABNORMAL HIGH (ref 11.5–15.5)
WBC: 5.1 10*3/uL (ref 4.0–10.5)
nRBC: 0 % (ref 0.0–0.2)

## 2019-08-29 LAB — C-REACTIVE PROTEIN: CRP: 6.7 mg/dL — ABNORMAL HIGH (ref <1.0)

## 2019-08-29 LAB — EXPECTORATED SPUTUM ASSESSMENT W GRAM STAIN, RFLX TO RESP C

## 2019-08-29 LAB — COMPREHENSIVE METABOLIC PANEL
ALT: 20 U/L (ref 0–44)
AST: 31 U/L (ref 15–41)
Albumin: 2.7 g/dL — ABNORMAL LOW (ref 3.5–5.0)
Alkaline Phosphatase: 63 U/L (ref 38–126)
Anion gap: 10 (ref 5–15)
BUN: 55 mg/dL — ABNORMAL HIGH (ref 8–23)
CO2: 21 mmol/L — ABNORMAL LOW (ref 22–32)
Calcium: 7.5 mg/dL — ABNORMAL LOW (ref 8.9–10.3)
Chloride: 105 mmol/L (ref 98–111)
Creatinine, Ser: 2.34 mg/dL — ABNORMAL HIGH (ref 0.61–1.24)
GFR calc Af Amer: 30 mL/min — ABNORMAL LOW (ref >60)
GFR calc non Af Amer: 26 mL/min — ABNORMAL LOW (ref >60)
Glucose, Bld: 187 mg/dL — ABNORMAL HIGH (ref 70–99)
Potassium: 4.4 mmol/L (ref 3.5–5.1)
Sodium: 136 mmol/L (ref 135–145)
Total Bilirubin: 0.9 mg/dL (ref 0.3–1.2)
Total Protein: 5.9 g/dL — ABNORMAL LOW (ref 6.5–8.1)

## 2019-08-29 LAB — GLUCOSE, CAPILLARY
Glucose-Capillary: 151 mg/dL — ABNORMAL HIGH (ref 70–99)
Glucose-Capillary: 150 mg/dL — ABNORMAL HIGH (ref 70–99)
Glucose-Capillary: 173 mg/dL — ABNORMAL HIGH (ref 70–99)
Glucose-Capillary: 169 mg/dL — ABNORMAL HIGH (ref 70–99)

## 2019-08-29 LAB — D-DIMER, QUANTITATIVE: D-Dimer, Quant: 1.48 ug/mL-FEU — ABNORMAL HIGH (ref 0.00–0.50)

## 2019-08-29 LAB — FERRITIN: Ferritin: 610 ng/mL — ABNORMAL HIGH (ref 24–336)

## 2019-08-29 MED ORDER — ACETAMINOPHEN 325 MG PO TABS
650.0000 mg | ORAL_TABLET | Freq: Four times a day (QID) | ORAL | Status: DC | PRN
  Administered 2019-08-29 – 2019-09-08 (×13): 650 mg via ORAL
  Filled 2019-08-29 (×15): qty 2

## 2019-08-29 MED ORDER — SODIUM CHLORIDE 0.9% IV SOLUTION: Freq: Once | INTRAVENOUS | Status: AC

## 2019-08-29 MED ORDER — CEFAZOLIN SODIUM-DEXTROSE 1-4 GM/50ML-% IV SOLN
1.0000 g | Freq: Three times a day (TID) | INTRAVENOUS | Status: DC
  Administered 2019-08-29 – 2019-08-31 (×6): 1 g via INTRAVENOUS
  Filled 2019-08-29 (×8): qty 50

## 2019-08-29 NOTE — Progress Notes (Signed)
Around 1500 today patient ambulated with assistance to the restroom. Wearing 3 LPM of oxygen with sufficient amount of tubing extension to reach to the restroom. Patients O2 sat dropped to the lower 70's, patient expelling copious pink tnged sputum. Got patient back into bed, applied high flow nasal cannula with humidification @ 6 LPM and patients saturation levels are now between 92-95%. Notified Wyline Copas, MD he is aware.

## 2019-08-29 NOTE — Progress Notes (Signed)
[facility] Progress Note  CC:  Anemia, heme + stool   Subjective:  He is sitting up in the bed. He is more talkative today. He is tolerating a regular diet. He passed a normal brown BM this morning. He denies seeing any rectal bleeding or melena. His RN is at his bed side and she also reports no obvious rectal bleeding today. No abdominal pain. He continues to cough up white phlegm. No hemoptysis.    Objective:  Vital signs in last 24 hours: Temp:  [97.6 F (36.4 C)-98.2 F (36.8 C)] 97.6 F (36.4 C) (02/02 0538) Pulse Rate:  [55-67] 63 (02/02 0925) Resp:  [15-18] 16 (02/02 0538) BP: (124-158)/(42-81) 149/56 (02/02 0925) SpO2:  [82 %-100 %] 90 % (02/02 1202) Weight:  [110.7 kg] 110.7 kg (02/01 1830) Last BM Date: 08/27/19 General:  79 year old male in NAD. Heart: RRR, no murmur. Pulm:  Lungs clear throughout, diminished in the bases bilaterally.  Abdomen: Soft, nondistended. Nontender. + BS x 4 quads. No HSM. Extremities:  Without edema. Neurologic:  Alert and  oriented x3;  grossly normal neurologically. Psych:  Alert and cooperative. Normal mood and affect.  Intake/Output from previous day: No intake/output data recorded. Intake/Output this shift: Total I/O In: 600 [P.O.:600] Out: -   Lab Results: Recent Labs    09/20/2019 1113 08/29/19 0337  WBC 5.6 5.1  HGB 8.6* 7.9*  HCT 27.9* 25.6*  PLT 224 251   BMET Recent Labs    08/29/2019 1113 08/29/19 0337  NA 138 136  K 4.0 4.4  CL 106 105  CO2 22 21*  GLUCOSE 200* 187*  BUN 48* 55*  CREATININE 2.38* 2.34*  CALCIUM 7.6* 7.5*   LFT Recent Labs    08/29/19 0337  PROT 5.9*  ALBUMIN 2.7*  AST 31  ALT 20  ALKPHOS 63  BILITOT 0.9   PT/INR No results for input(s): LABPROT, INR in the last 72 hours. Hepatitis Panel No results for input(s): HEPBSAG, HCVAB, HEPAIGM, HEPBIGM in the last 72 hours.  DG Chest Port 1 View  Result Date: 09/18/2019 CLINICAL DATA:  79 year old male with a history  of shortness of breath EXAM: PORTABLE CHEST 1 VIEW COMPARISON:  01/31/2011 FINDINGS: Cardiomediastinal silhouette unchanged in size and contour. Low lung volumes with new mixed interstitial and airspace disease of the right lung, and to a lesser degree the lower left lung. No pneumothorax or large pleural effusion. IMPRESSION: New right greater than left mixed interstitial and airspace disease, suggesting multifocal pneumonia. Electronically Signed   By: Corrie Mckusick D.O.   On: 09/07/2019 11:40    Assessment / Plan:  53.  79 year old male with acute on chronic anemia.  Past history of small bowel AVMs and colon polyps.  GI bleed in 2017/2018 most likely hemorrhoidal and small bowel AVMs. He presented  to [facility] ED with fatigue and SOB. Found to be anemic. Hg 8.6.  (Hg 9.2 on 06/28/2019).  HCT 27.9. BUN 48. FOBT positive. Rectal exam 2/1 showed inflamed enlarged internal hemorrhoids with a scant amount of pink red blood on the exam glove. Anusol HC suppositories ordered x 5 nights.  Today Hg 7.9 down from 8.6. (Baseline Hg 9.2 on 06/28/2019). BUN 55 up from 48.  -repeat CBC in AM -transfuse for Hg < 7 -continue to monitor for signs of active GI bleeding  -will need colonoscopy +/- EGD when respiratory status stable.   2. SARS COVID-19 pneumonia.  Patient is on oxygen 2 L  nasal cannula.  Receiving Remdesivir and Decadron.  -Management per the hospitalist team  3. Blood cultures 2/1 + Staphylococcus. On Ancef 1 gm IV Q 8hrs  4.  History of CAD, paroxysmal atrial fibrillation, CHF on ASA and Amiodarone   5.  Diabetes mellitus type 2  Further recommendations per Dr. Rush Landmark     Active Problems:   DM (diabetes mellitus), type 2 with peripheral vascular complications ([place])   AVM (arteriovenous malformation) of small bowel, acquired with hemorrhage   Iron deficiency anemia due to chronic blood loss   Gout   Persistent atrial fibrillation   Hyperlipidemia   Hypothyroidism   Shortness of  breath   Chronic diastolic CHF (congestive heart failure) (HCC)   CKD (chronic kidney disease), stage III   Dyspnea     LOS: 1 day   Michael [name]  08/29/2019, 12:45 PM

## 2019-08-29 NOTE — Progress Notes (Signed)
PHARMACY - PHYSICIAN COMMUNICATION CRITICAL VALUE ALERT - BLOOD CULTURE IDENTIFICATION (BCID)  Michael [name] is an 79 y.o. male who presented to [facility] on 09/09/2019 with a chief complaint of cold -like symptoms, cough, SOB, weakness  Assessment:  Covid-19 positive  Name of physician (or Provider) Contacted: Dr. Wyline Copas  Current antibiotics: none  Changes to prescribed antibiotics recommended:  none  Results for orders placed or performed during the hospital encounter of 07/27/16  Blood Culture ID Panel (Reflexed) (Collected: 07/30/2016  7:54 AM)  Result Value Ref Range   Enterococcus species NOT DETECTED NOT DETECTED   Listeria monocytogenes NOT DETECTED NOT DETECTED   Staphylococcus species DETECTED (A) NOT DETECTED   Staphylococcus aureus (BCID) DETECTED (A) NOT DETECTED   Methicillin resistance NOT DETECTED NOT DETECTED   Streptococcus species NOT DETECTED NOT DETECTED   Streptococcus agalactiae NOT DETECTED NOT DETECTED   Streptococcus pneumoniae NOT DETECTED NOT DETECTED   Streptococcus pyogenes NOT DETECTED NOT DETECTED   Acinetobacter baumannii NOT DETECTED NOT DETECTED   Enterobacteriaceae species NOT DETECTED NOT DETECTED   Enterobacter cloacae complex NOT DETECTED NOT DETECTED   Escherichia coli NOT DETECTED NOT DETECTED   Klebsiella oxytoca NOT DETECTED NOT DETECTED   Klebsiella pneumoniae NOT DETECTED NOT DETECTED   Proteus species NOT DETECTED NOT DETECTED   Serratia marcescens NOT DETECTED NOT DETECTED   Haemophilus influenzae NOT DETECTED NOT DETECTED   Neisseria meningitidis NOT DETECTED NOT DETECTED   Pseudomonas aeruginosa NOT DETECTED NOT DETECTED   Candida albicans NOT DETECTED NOT DETECTED   Candida glabrata NOT DETECTED NOT DETECTED   Candida krusei NOT DETECTED NOT DETECTED   Candida parapsilosis NOT DETECTED NOT DETECTED   Candida tropicalis NOT DETECTED NOT DETECTED    Dolly Rias RPh 08/29/2019, 10:08 AM

## 2019-08-29 NOTE — Progress Notes (Signed)
PHARMACY - PHYSICIAN COMMUNICATION CRITICAL VALUE ALERT - BLOOD CULTURE IDENTIFICATION (BCID)  Michael [name] is an 79 y.o. male who presented to [facility] on 08/31/2019 with a chief complaint of cold -like symptoms, cough, SOB, weakness  Assessment:  Covid-19 positive  Name of physician (or Provider) Contacted: Dr. Wyline Copas  Current antibiotics: none  Changes to prescribed antibiotics recommended:  Cefazolin 1gm IV q8h  Results for orders placed or performed during the hospital encounter of 09/10/2019  Blood Culture ID Panel (Reflexed) (Collected: 09/24/2019  5:17 PM)  Result Value Ref Range   Enterococcus species NOT DETECTED NOT DETECTED   Listeria monocytogenes NOT DETECTED NOT DETECTED   Staphylococcus species DETECTED (A) NOT DETECTED   Staphylococcus aureus (BCID) NOT DETECTED NOT DETECTED   Methicillin resistance NOT DETECTED NOT DETECTED   Streptococcus species NOT DETECTED NOT DETECTED   Streptococcus agalactiae NOT DETECTED NOT DETECTED   Streptococcus pneumoniae NOT DETECTED NOT DETECTED   Streptococcus pyogenes NOT DETECTED NOT DETECTED   Acinetobacter baumannii NOT DETECTED NOT DETECTED   Enterobacteriaceae species NOT DETECTED NOT DETECTED   Enterobacter cloacae complex NOT DETECTED NOT DETECTED   Escherichia coli NOT DETECTED NOT DETECTED   Klebsiella oxytoca NOT DETECTED NOT DETECTED   Klebsiella pneumoniae NOT DETECTED NOT DETECTED   Proteus species NOT DETECTED NOT DETECTED   Serratia marcescens NOT DETECTED NOT DETECTED   Haemophilus influenzae NOT DETECTED NOT DETECTED   Neisseria meningitidis NOT DETECTED NOT DETECTED   Pseudomonas aeruginosa NOT DETECTED NOT DETECTED   Candida albicans NOT DETECTED NOT DETECTED   Candida glabrata NOT DETECTED NOT DETECTED   Candida krusei NOT DETECTED NOT DETECTED   Candida parapsilosis NOT DETECTED NOT DETECTED   Candida tropicalis NOT DETECTED NOT DETECTED    Dolly Rias RPh 08/29/2019, 12:41 PM

## 2019-08-29 NOTE — Progress Notes (Addendum)
Update: Pt this afternoon with worsened sob, requiring increased O2 to [facility]. Already on remdesivir and steroids. Given clinical worsening, have ordered 1 unit convalescent plasma. Patient reports taking secukinumab over the past year to treat psoriasis, ending in end of December 2020. As pt has been on chronic immunomodulator, would not be candidate for actemra. Have called and discussed the above with the patient and he is in full agreement. Have also updated GI.

## 2019-08-29 NOTE — Progress Notes (Signed)
PROGRESS NOTE    Michael [name]  BOF:[id number] DOB: [birth year] DOA: 09/24/2019 PCP: Cassandria Anger, MD    Brief Narrative:  79 y.o. male with medical history significant of CKD stage 3b, paroxysmal A. fib, hypertension, obesity, coronary artery disease, chronic diastolic CHF, anemia who presents to the emergency department with complaints of cold-like symptoms, cough, fatigue, poor appetite.  This has been going on for last few days.  He lives with his wife. Patient was feeling cold, shortness of breath at home and it was getting worse.  He said he was freezing to death.  As per the wife, he was sleeping all the time and not taking his meds.  He has low appetite and was not drinking and eating well. Family members also had some cold-like symptoms but have not been tested.   He follows with cardiology for his A. fib, congestive heart failure and also follows with nephrology.  He follows with Dr. [name] for his history of AVMs. Patient seen and examined at the bedside in the emergency department.  Currently hemodynamically stable.  On 2 L of oxygen per minute.  He denies any fever, abdomen pain, nausea, vomiting, diarrhea or dysuria.  ED Course: Chest x-ray done in the emergency department showed multifocal pneumonia. Covid testing confirms COVID pna  Assessment & Plan:   Active Problems:   DM (diabetes mellitus), type 2 with peripheral vascular complications (HCC)   AVM (arteriovenous malformation) of small bowel, acquired with hemorrhage   Iron deficiency anemia due to chronic blood loss   Gout   Persistent atrial fibrillation   Hyperlipidemia   Hypothyroidism   Shortness of breath   Chronic diastolic CHF (congestive heart failure) (HCC)   CKD (chronic kidney disease), stage III   Dyspnea   COVID Multifocal pneumonia:  -Chest imaging reviewed, findings of new R>L mixed interstitial airspace disease -Covid testing confirms COVID PNA -Pt on remdesivir and decadron, anticipate  5 and 10 days tx, respectively -Initially presented on 2LNC, now on 4LNC this AM -CRP of 6.7, ddimer of 1.48 -Follow inflammatory marker trends  GI bleed:  -FOBT positive.   -His last hemoglobin was 11.2 on 7/20 which is dropped to 8.6 on 2/1, now down to 7.9 -GI is following. Recommendation for endoscopy later after covid PNA resolves -Will follow CBC trends. GI continues to follow  Coronary artery disease:  -Last cath was in 2018 which was done after he presented with an NSTEMI. -Has DES to OM1.   -Continue on aspirin, beta-blockers, statin. -Chest pain free this AM  Paroxysmal A. fib:  -Currently in normal sinus rhythm.   -Continue rate control with amiodarone, metoprolol and diltiazem.   -Not on anticoagulation due to history of GI bleed.  Diastolic CHF:  -Echocardiogram on June 2020 showed ejection fraction of 66-65% .Previous Echo had shown grade 2 diastolic dysfunction.  -Diuretics held at time of admit.  Currently euvolemic  Hypertension:  -BP currently stable -currently blood pressure stable.  Continue current medications.  Hyperlipidemia:  -Continue on lipitor as tolerated  Hypothyroidism:  -Continue Synthyroid as tolerated  Diabetes type 2:  -On insulin at home. -Currently on lantus 25 units with SSI coverage -glucose trends stable  CKD stage IIIb-IV:  -His baseline creatinine is around 2.  Currently kidney function near baseline.  Follows with [facility].  H/O psoriasis: -Continued on cosentyx.  DVT prophylaxis: SCD's Code Status: Full Family Communication: Pt in room, family not at bedside Disposition Plan: Home when off o2 and cleared  by GI  Consultants:   GI  Procedures:     Antimicrobials: Anti-infectives (From admission, onward)   Start     Dose/Rate Route Frequency Ordered Stop   08/29/19 1400  ceFAZolin (ANCEF) IVPB 1 g/50 mL premix     1 g 100 mL/hr over 30 Minutes Intravenous Every 8 hours 08/29/19 1239     08/29/19  1000  remdesivir 100 mg in sodium chloride 0.9 % 100 mL IVPB     100 mg 200 mL/hr over 30 Minutes Intravenous Daily 09/24/2019 1701 09/02/19 0959   09/14/2019 1800  remdesivir 200 mg in sodium chloride 0.9% 250 mL IVPB     200 mg 580 mL/hr over 30 Minutes Intravenous Once 09/24/2019 1701 09/21/2019 1851       Subjective: Without evidence of GI bleed this AM. No chest pain. Actively coughing  Objective: Vitals:   08/29/19 1159 08/29/19 1202 08/29/19 1337 08/29/19 1338  BP:      Pulse:      Resp:      Temp:      TempSrc:      SpO2: (!) 83% 90% 95% 95%  Weight:      Height:        Intake/Output Summary (Last 24 hours) at 08/29/2019 1348 Last data filed at 08/29/2019 1233 Gross per 24 hour  Intake 600 ml  Output --  Net 600 ml   Filed Weights   09/14/2019 1830  Weight: 110.7 kg    Examination:  General exam: Appears calm and comfortable  Respiratory system: Clear to auscultation. milldly increased resp effort, actively coughing Cardiovascular system: S1 & S2 heard, Regular Gastrointestinal system: Abdomen is nondistended, soft and nontender. No organomegaly or masses felt. Normal bowel sounds heard. Central nervous system: Alert and oriented. No focal neurological deficits. Extremities: Symmetric 5 x 5 power. Skin: No rashes, lesions Psychiatry: Judgement and insight appear normal. Mood & affect appropriate.   Data Reviewed: I have personally reviewed following labs and imaging studies  CBC: Recent Labs  Lab 09/03/2019 1113 08/29/19 0337  WBC 5.6 5.1  NEUTROABS 5.0 4.4  HGB 8.6* 7.9*  HCT 27.9* 25.6*  MCV 87.7 88.6  PLT 224 287   Basic Metabolic Panel: Recent Labs  Lab 09/15/2019 1113 08/29/19 0337  NA 138 136  K 4.0 4.4  CL 106 105  CO2 22 21*  GLUCOSE 200* 187*  BUN 48* 55*  CREATININE 2.38* 2.34*  CALCIUM 7.6* 7.5*   GFR: Estimated Creatinine Clearance: 34 mL/min (A) (by C-G formula based on SCr of 2.34 mg/dL (H)). Liver Function Tests: Recent Labs  Lab  09/23/2019 1113 08/29/19 0337  AST 39 31  ALT 23 20  ALKPHOS 61 63  BILITOT 1.0 0.9  PROT 6.4* 5.9*  ALBUMIN 2.8* 2.7*   No results for input(s): LIPASE, AMYLASE in the last 168 hours. No results for input(s): AMMONIA in the last 168 hours. Coagulation Profile: No results for input(s): INR, PROTIME in the last 168 hours. Cardiac Enzymes: No results for input(s): CKTOTAL, CKMB, CKMBINDEX, TROPONINI in the last 168 hours. BNP (last 3 results) No results for input(s): PROBNP in the last 8760 hours. HbA1C: Recent Labs    09/01/2019 1640  HGBA1C 5.7*   CBG: Recent Labs  Lab 09/07/2019 1640 09/14/2019 2117 08/29/19 0724 08/29/19 1131  GLUCAP 209* 224*  225* 151* 150*   Lipid Profile: Recent Labs    09/02/2019 1113  TRIG 133   Thyroid Function Tests: No results for input(s): TSH, T4TOTAL,  FREET4, T3FREE, THYROIDAB in the last 72 hours. Anemia Panel: Recent Labs    09/10/2019 1419 08/29/19 0337  FERRITIN 814* 610*  TIBC 237*  --   IRON 22*  --    Sepsis Labs: Recent Labs  Lab 09/23/2019 1113  PROCALCITON 0.19  LATICACIDVEN 1.8    Recent Results (from the past 240 hour(s))  Blood Culture (routine x 2)     Status: None (Preliminary result)   Collection Time: 09/20/2019 11:13 AM   Specimen: BLOOD  Result Value Ref Range Status   Specimen Description   Final    BLOOD RIGHT ANTECUBITAL Performed at [facility] [address] [place], [place] [address]    Special Requests   Final    BOTTLES DRAWN AEROBIC AND ANAEROBIC Blood Culture adequate volume Performed at [facility] [address] [place], [place] [address]    Culture  Setup Time   Final    GRAM POSITIVE COCCI AEROBIC BOTTLE ONLY CRITICAL RESULT CALLED TO, READ BACK BY AND VERIFIED WITH: PHARMD M SWEYNE [id number] AT 48 AM BY CM Performed at [facility], [place] [address] [place], [place] [address]    Culture NO GROWTH < 24 HOURS  Final   Report Status PENDING   Incomplete  Respiratory Panel by RT PCR (Flu A&B, Covid) - Nasopharyngeal Swab     Status: Abnormal   Collection Time: 09/20/2019 12:44 PM   Specimen: Nasopharyngeal Swab  Result Value Ref Range Status   SARS Coronavirus 2 by RT PCR POSITIVE (A) NEGATIVE Final    Comment: RESULT CALLED TO, READ BACK BY AND VERIFIED WITH: C.GRIFFITH AT 1607 ON 09/15/2019 BY N.THOMPSON (NOTE) SARS-CoV-2 target nucleic acids are DETECTED. SARS-CoV-2 RNA is generally detectable in upper respiratory specimens  during the acute phase of infection. Positive results are indicative of the presence of the identified virus, but do not rule out bacterial infection or co-infection with other pathogens not detected by the test. Clinical correlation with patient history and other diagnostic information is necessary to determine patient infection status. The expected result is Negative. Fact Sheet for Patients:  PinkCheek.be Fact Sheet for Healthcare Providers: GravelBags.it This test is not yet approved or cleared by the [place] FDA and  has been authorized for detection and/or diagnosis of SARS-CoV-2 by FDA under an Emergency Use Authorization (EUA).  This EUA will remain in effect (meaning this test can be  used) for the duration of  the COVID-19 declaration under Section 564(b)(1) of the Act, 21 U.S.C. section 360bbb-3(b)(1), unless the authorization is terminated or revoked sooner.    Influenza A by PCR NEGATIVE NEGATIVE Final   Influenza B by PCR NEGATIVE NEGATIVE Final    Comment: (NOTE) The Xpert Xpress SARS-CoV-2/FLU/RSV assay is intended as an aid in  the diagnosis of influenza from Nasopharyngeal swab specimens and  should not be used as a sole basis for treatment. Nasal washings and  aspirates are unacceptable for Xpert Xpress SARS-CoV-2/FLU/RSV  testing. Fact Sheet for Patients: PinkCheek.be Fact Sheet for  Healthcare Providers: GravelBags.it This test is not yet approved or cleared by the [place] FDA and  has been authorized for detection and/or diagnosis of SARS-CoV-2 by  FDA under an Emergency Use Authorization (EUA). This EUA will remain  in effect (meaning this test can be used) for the duration of the  Covid-19 declaration under Section 564(b)(1) of the Act, 21  U.S.C. section 360bbb-3(b)(1), unless the authorization is  terminated or revoked. Performed  at [facility], [place] [address], [place], [place] [address]   Blood Culture (routine x 2)     Status: None (Preliminary result)   Collection Time: 09/24/2019  5:17 PM   Specimen: BLOOD  Result Value Ref Range Status   Specimen Description BLOOD LEFT ANTECUBITAL  Final   Special Requests   Final    BOTTLES DRAWN AEROBIC AND ANAEROBIC Blood Culture results may not be optimal due to an excessive volume of blood received in culture bottles   Culture  Setup Time   Final    GRAM POSITIVE COCCI AEROBIC BOTTLE ONLY Organism ID to follow CRITICAL VALUE NOTED.  VALUE IS CONSISTENT WITH PREVIOUSLY REPORTED AND CALLED VALUE. Performed at [facility], [place] [address], [place], [place] [address]    Culture NO GROWTH < 12 HOURS  Final   Report Status PENDING  Incomplete  Blood Culture ID Panel (Reflexed)     Status: Abnormal   Collection Time: 09/04/2019  5:17 PM  Result Value Ref Range Status   Enterococcus species NOT DETECTED NOT DETECTED Final   Listeria monocytogenes NOT DETECTED NOT DETECTED Final   Staphylococcus species DETECTED (A) NOT DETECTED Final    Comment: Methicillin (oxacillin) susceptible coagulase negative staphylococcus. Possible blood culture contaminant (unless isolated from more than one blood culture draw or clinical case suggests pathogenicity). No antibiotic treatment is indicated for blood  culture contaminants. CRITICAL RESULT CALLED TO, READ BACK BY AND  VERIFIED WITH: Shelda Jakes PharmD 12:30 08/29/19 ([email or web address])    Staphylococcus aureus (BCID) NOT DETECTED NOT DETECTED Final   Methicillin resistance NOT DETECTED NOT DETECTED Final   Streptococcus species NOT DETECTED NOT DETECTED Final   Streptococcus agalactiae NOT DETECTED NOT DETECTED Final   Streptococcus pneumoniae NOT DETECTED NOT DETECTED Final   Streptococcus pyogenes NOT DETECTED NOT DETECTED Final   Acinetobacter baumannii NOT DETECTED NOT DETECTED Final   Enterobacteriaceae species NOT DETECTED NOT DETECTED Final   Enterobacter cloacae complex NOT DETECTED NOT DETECTED Final   Escherichia coli NOT DETECTED NOT DETECTED Final   Klebsiella oxytoca NOT DETECTED NOT DETECTED Final   Klebsiella pneumoniae NOT DETECTED NOT DETECTED Final   Proteus species NOT DETECTED NOT DETECTED Final   Serratia marcescens NOT DETECTED NOT DETECTED Final   Haemophilus influenzae NOT DETECTED NOT DETECTED Final   Neisseria meningitidis NOT DETECTED NOT DETECTED Final   Pseudomonas aeruginosa NOT DETECTED NOT DETECTED Final   Candida albicans NOT DETECTED NOT DETECTED Final   Candida glabrata NOT DETECTED NOT DETECTED Final   Candida krusei NOT DETECTED NOT DETECTED Final   Candida parapsilosis NOT DETECTED NOT DETECTED Final   Candida tropicalis NOT DETECTED NOT DETECTED Final    Comment: Performed at [facility], [address] [place], [place] [address]     Radiology Studies: DG Chest [place] 1 View  Result Date: 09/07/2019 CLINICAL DATA:  79 year old male with a history of shortness of breath EXAM: PORTABLE CHEST 1 VIEW COMPARISON:  01/31/2011 FINDINGS: Cardiomediastinal silhouette unchanged in size and contour. Low lung volumes with new mixed interstitial and airspace disease of the right lung, and to a lesser degree the lower left lung. No pneumothorax or large pleural effusion. IMPRESSION: New right greater than left mixed interstitial and airspace disease, suggesting multifocal pneumonia.  Electronically Signed   By: Corrie Mckusick D.O.   On: 09/24/2019 11:40    Scheduled Meds: . allopurinol  200 mg Oral Daily  . amiodarone  200 mg Oral Once  per day on Mon Tue Wed Thu Fri Sat  . vitamin C  500 mg Oral Daily  . atorvastatin  40 mg Oral Daily  . dexamethasone (DECADRON) injection  6 mg Intravenous Q24H  . diltiazem  180 mg Oral Daily  . fenofibrate  160 mg Oral Daily  . ferrous sulfate  325 mg Oral Daily  . hydrocortisone  25 mg Rectal QHS  . insulin aspart  0-5 Units Subcutaneous QHS  . insulin aspart  0-9 Units Subcutaneous TID WC  . insulin glargine  25 Units Subcutaneous Daily  . levothyroxine  150 mcg Oral QAC breakfast  . metoprolol tartrate  12.5 mg Oral BID  . zinc sulfate  220 mg Oral Daily   Continuous Infusions: .  ceFAZolin (ANCEF) IV 1 g (08/29/19 1334)  . remdesivir 100 mg in NS 100 mL 100 mg (08/29/19 0932)     LOS: 1 day   Marylu Lund, MD [facility] Pager On Amion  If 7PM-7AM, please contact night-coverage 08/29/2019, 1:48 PM

## 2019-08-30 ENCOUNTER — Inpatient Hospital Stay (HOSPITAL_COMMUNITY): Payer: HMO

## 2019-08-30 ENCOUNTER — Telehealth: Payer: Self-pay

## 2019-08-30 DIAGNOSIS — R0602 Shortness of breath: Secondary | ICD-10-CM

## 2019-08-30 DIAGNOSIS — U071 COVID-19: Secondary | ICD-10-CM | POA: Diagnosis present

## 2019-08-30 DIAGNOSIS — J1282 Pneumonia due to coronavirus disease 2019: Secondary | ICD-10-CM

## 2019-08-30 DIAGNOSIS — N1832 Chronic kidney disease, stage 3b: Secondary | ICD-10-CM

## 2019-08-30 DIAGNOSIS — R7881 Bacteremia: Secondary | ICD-10-CM | POA: Diagnosis present

## 2019-08-30 DIAGNOSIS — E039 Hypothyroidism, unspecified: Secondary | ICD-10-CM

## 2019-08-30 LAB — FERRITIN: Ferritin: 924 ng/mL — ABNORMAL HIGH (ref 24–336)

## 2019-08-30 LAB — COMPREHENSIVE METABOLIC PANEL
ALT: 19 U/L (ref 0–44)
AST: 35 U/L (ref 15–41)
Albumin: 2.5 g/dL — ABNORMAL LOW (ref 3.5–5.0)
Alkaline Phosphatase: 74 U/L (ref 38–126)
Anion gap: 9 (ref 5–15)
BUN: 56 mg/dL — ABNORMAL HIGH (ref 8–23)
CO2: 23 mmol/L (ref 22–32)
Calcium: 7.6 mg/dL — ABNORMAL LOW (ref 8.9–10.3)
Chloride: 105 mmol/L (ref 98–111)
Creatinine, Ser: 2.54 mg/dL — ABNORMAL HIGH (ref 0.61–1.24)
GFR calc Af Amer: 27 mL/min — ABNORMAL LOW (ref >60)
GFR calc non Af Amer: 24 mL/min — ABNORMAL LOW (ref >60)
Glucose, Bld: 156 mg/dL — ABNORMAL HIGH (ref 70–99)
Potassium: 4.8 mmol/L (ref 3.5–5.1)
Sodium: 137 mmol/L (ref 135–145)
Total Bilirubin: 0.7 mg/dL (ref 0.3–1.2)
Total Protein: 5.7 g/dL — ABNORMAL LOW (ref 6.5–8.1)

## 2019-08-30 LAB — CBC WITH DIFFERENTIAL/PLATELET
Abs Immature Granulocytes: 0.06 10*3/uL (ref 0.00–0.07)
Basophils Absolute: 0 10*3/uL (ref 0.0–0.1)
Basophils Relative: 0 %
Eosinophils Absolute: 0 10*3/uL (ref 0.0–0.5)
Eosinophils Relative: 0 %
HCT: 26.5 % — ABNORMAL LOW (ref 39.0–52.0)
Hemoglobin: 8.2 g/dL — ABNORMAL LOW (ref 13.0–17.0)
Immature Granulocytes: 1 %
Lymphocytes Relative: 7 %
Lymphs Abs: 0.5 10*3/uL — ABNORMAL LOW (ref 0.7–4.0)
MCH: 27.2 pg (ref 26.0–34.0)
MCHC: 30.9 g/dL (ref 30.0–36.0)
MCV: 88 fL (ref 80.0–100.0)
Monocytes Absolute: 0.3 10*3/uL (ref 0.1–1.0)
Monocytes Relative: 4 %
Neutro Abs: 5.6 10*3/uL (ref 1.7–7.7)
Neutrophils Relative %: 88 %
Platelets: 243 10*3/uL (ref 150–400)
RBC: 3.01 MIL/uL — ABNORMAL LOW (ref 4.22–5.81)
RDW: 17 % — ABNORMAL HIGH (ref 11.5–15.5)
WBC: 6.4 10*3/uL (ref 4.0–10.5)
nRBC: 0 % (ref 0.0–0.2)

## 2019-08-30 LAB — D-DIMER, QUANTITATIVE: D-Dimer, Quant: 4.65 ug/mL-FEU — ABNORMAL HIGH (ref 0.00–0.50)

## 2019-08-30 LAB — BPAM FFP
Blood Product Expiration Date: 202102032052
ISSUE DATE / TIME: 202102022238
Unit Type and Rh: 6200

## 2019-08-30 LAB — PREPARE FRESH FROZEN PLASMA: Unit division: 0

## 2019-08-30 LAB — GLUCOSE, CAPILLARY
Glucose-Capillary: 130 mg/dL — ABNORMAL HIGH (ref 70–99)
Glucose-Capillary: 213 mg/dL — ABNORMAL HIGH (ref 70–99)
Glucose-Capillary: 328 mg/dL — ABNORMAL HIGH (ref 70–99)
Glucose-Capillary: 220 mg/dL — ABNORMAL HIGH (ref 70–99)

## 2019-08-30 LAB — C-REACTIVE PROTEIN: CRP: 10.5 mg/dL — ABNORMAL HIGH (ref <1.0)

## 2019-08-30 NOTE — Telephone Encounter (Signed)
Michael [name] with [facility] calling and states that she faxed some paperwork over on 08/22/2019, needing information on whether the patient has diabetes or congestive heart failure. Please advise.  FAX#: [id number] CB#: [phone]

## 2019-08-30 NOTE — Progress Notes (Signed)
[facility] Progress Note  CC:  Anemia, Heme + stool   Subjective: I spoke to the patient's RN, Jarrett Soho. She stated the patient passed a normal brown solid BM earlier today. No obvious rectal bleeding. No melena. His po intake is low. No N/V. No complaints of abdominal pain. He became hypoxic after getting up to the [facility]. O2 saturation dropped to 70%. His oxygen was increased from 6L to 15L [place] and his O2 saturations increased to 93%.    Objective:  Vital signs in last 24 hours: Temp:  [97.5 F (36.4 C)-98.4 F (36.9 C)] 97.9 F (36.6 C) (02/03 1403) Pulse Rate:  [62-87] 66 (02/03 1403) Resp:  [16-28] 16 (02/03 1403) BP: (134-186)/(54-75) 150/54 (02/03 1403) SpO2:  [79 %-96 %] 93 % (02/03 1403) Last BM Date: 08/27/19  Intake/Output from previous day: 02/02 0701 - 02/03 0700 In: 2317 [P.O.:1830; Blood:291.7; IV Piggyback:195.4] Out: 450 [Urine:450] Intake/Output this shift: Total I/O In: 570.2 [P.O.:480; IV Piggyback:90.2] Out: 450 [Urine:450]  Lab Results: Recent Labs    09/14/2019 1113 08/29/19 0337 08/30/19 0429  WBC 5.6 5.1 6.4  HGB 8.6* 7.9* 8.2*  HCT 27.9* 25.6* 26.5*  PLT 224 251 243   BMET Recent Labs    09/02/2019 1113 08/29/19 0337 08/30/19 0429  NA 138 136 137  K 4.0 4.4 4.8  CL 106 105 105  CO2 22 21* 23  GLUCOSE 200* 187* 156*  BUN 48* 55* 56*  CREATININE 2.38* 2.34* 2.54*  CALCIUM 7.6* 7.5* 7.6*   LFT Recent Labs    08/30/19 0429  PROT 5.7*  ALBUMIN 2.5*  AST 35  ALT 19  ALKPHOS 74  BILITOT 0.7   PT/INR No results for input(s): LABPROT, INR in the last 72 hours. Hepatitis Panel No results for input(s): HEPBSAG, HCVAB, HEPAIGM, HEPBIGM in the last 72 hours.  VAS [name] LOWER EXTREMITY VENOUS (DVT)  Result Date: 08/30/2019  Lower Venous DVTStudy Indications: SOB. Other Indications: Covid+. Risk Factors: Obesity and A fib, DM, HTN, HLD, CAD. Limitations: Body habitus and poor ultrasound/tissue interface. Comparison Study: No  prior exam. Performing Technologist: Baldwin Crown ARDMS, RVT  Examination Guidelines: A complete evaluation includes B-mode imaging, spectral Doppler, color Doppler, and power Doppler as needed of all accessible portions of each vessel. Bilateral testing is considered an integral part of a complete examination. Limited examinations for reoccurring indications may be performed as noted. The reflux portion of the exam is performed with the patient in reverse Trendelenburg.  +---------+---------------+---------+-----------+----------+--------------+ RIGHT    CompressibilityPhasicitySpontaneityPropertiesThrombus Aging +---------+---------------+---------+-----------+----------+--------------+ CFV      Full           Yes      Yes                                 +---------+---------------+---------+-----------+----------+--------------+ SFJ      Full                                                        +---------+---------------+---------+-----------+----------+--------------+ FV Prox  Full                                                        +---------+---------------+---------+-----------+----------+--------------+  FV Mid   Full                                                        +---------+---------------+---------+-----------+----------+--------------+ FV DistalFull                                                        +---------+---------------+---------+-----------+----------+--------------+ PFV      Full                                                        +---------+---------------+---------+-----------+----------+--------------+ POP      Full           Yes      Yes                                 +---------+---------------+---------+-----------+----------+--------------+ PTV      Full                                                        +---------+---------------+---------+-----------+----------+--------------+ PERO     Full                                                         +---------+---------------+---------+-----------+----------+--------------+   +---------+---------------+---------+-----------+----------+--------------+ LEFT     CompressibilityPhasicitySpontaneityPropertiesThrombus Aging +---------+---------------+---------+-----------+----------+--------------+ CFV      Full           Yes      Yes                                 +---------+---------------+---------+-----------+----------+--------------+ SFJ      Full                                                        +---------+---------------+---------+-----------+----------+--------------+ FV Prox  Full                                                        +---------+---------------+---------+-----------+----------+--------------+ FV Mid   Full                                                        +---------+---------------+---------+-----------+----------+--------------+  FV DistalFull                                                        +---------+---------------+---------+-----------+----------+--------------+ PFV      Full                                                        +---------+---------------+---------+-----------+----------+--------------+ POP      Full           Yes      Yes                                 +---------+---------------+---------+-----------+----------+--------------+ PTV      Full                                                        +---------+---------------+---------+-----------+----------+--------------+ PERO     Full                                                        +---------+---------------+---------+-----------+----------+--------------+ Poorly visualized bilateral calf veins due to body habitus,    Summary: RIGHT: - There is no evidence of deep vein thrombosis in the lower extremity.  - No cystic structure found in the popliteal fossa.  LEFT: - There is no  evidence of deep vein thrombosis in the lower extremity.  - No cystic structure found in the popliteal fossa.  *See table(s) above for measurements and observations.    Preliminary     Assessment / Plan:  58. 79 year old male with acute on chronic anemia. Past history of small bowel AVMs and colon polyps. GI bleed in 2017/2018 most likely hemorrhoidal and small bowel AVMs. He presented  to [facility] ED 2/1 with fatigue and SOB. Admission Hg 8.6.  (Hg 9.2 on 06/28/2019). HCT 27.9. BUN 48. FOBT positive.Rectal exam 2/1 showed inflamed enlarged internal hemorrhoids with a scant amount of pink red blood on the exam glove. Anusol HC suppositories ordered x 5 nights.  Today Hg is 8.2 up 7.9. He passed a normal solid BM today without obvious rectal bleeding or melena as reported by his RN. -repeat CBC in AM -transfuse for Hg < 7 -continue to monitor for signs of active GI bleeding  -will need colonoscopy +/- EGD when respiratory status stable.   2. SARS COVID-19 pneumonia. He became hypoxic today while after getting up from the [facility]. 02 increased from 6L to 15L [place]. Receiving Remdesivir and Decadron.  -Managementper the hospitalist team  3. Blood cultures 2/1 + Staphylococcus. On Ancef 1 gm IV Q 8hrs  4. CKD. Cr. 2.54.   5. History of CAD, paroxysmal atrial fibrillation, CHF on ASA and Amiodarone   6. Diabetes mellitus type 2  Further recommendations per Dr. Rush Landmark  Active Problems:   DM (diabetes mellitus), type 2 with peripheral vascular complications (HCC)   AVM (arteriovenous malformation) of small bowel, acquired with hemorrhage   Iron deficiency anemia due to chronic blood loss   Gout   Persistent atrial fibrillation   Hyperlipidemia   Hypothyroidism   Shortness of breath   Chronic diastolic CHF (congestive heart failure) (HCC)   CKD (chronic kidney disease), stage III   Dyspnea     LOS: 2 days   Noralyn Pick  08/30/2019, 2:32 PM

## 2019-08-30 NOTE — Progress Notes (Signed)
Pt ambulated to the [facility] and oxygen saturation dropped to 70%. Pt placed on 15 L of O2 and placed back in bed. Pt now resting comfortably and is back on 6 L with oxygen saturation of 95%>

## 2019-08-30 NOTE — Telephone Encounter (Signed)
Info given 

## 2019-08-30 NOTE — Progress Notes (Signed)
Patient ID: DANNA CASELLA, male   DOB: 12-Apr-1941, 79 y.o.   MRN: [id number]          [facility]    Date of Admission:  09/24/2019    Day 3 remdesivir and steroids        Day 2 cefazolin  Mr. Lackey is a 79 year old gentleman with multiple medical problems who was admitted 3 days ago with COVID-19 infection.  He was started on remdesivir and steroids.  He was not febrile upon admission but blood cultures were obtained and are now growing methicillin susceptible coag negative staph in both sets.  It is not clear to me if this represents true bacteremia or 2 contaminated sets.  I have asked the lab to do speciation and susceptibility testing on both isolates.  If they are not the same this would support true bacteremia and I would continue cefazolin.  If they are different there probable insignificant contaminants.  I will follow with you.         Michel Bickers, MD [facility] [place] [facility] [phone] pager   [phone] cell 08/30/2019, 2:56 PM

## 2019-08-30 NOTE — Progress Notes (Signed)
Bilateral lower extremity venous duplex exam completed.  Preliminary results can be found under CV proc under chart review.  08/30/2019 12:08 PM  Treyden Hakim, K., RDMS, RVT

## 2019-08-30 NOTE — Progress Notes (Addendum)
PROGRESS NOTE    Michael [name]  MCN:[id number] DOB: 1941-02-17 DOA: 09/05/2019 PCP: Cassandria Anger, MD    Brief Narrative:  79 y.o. male with medical history significant of CKD stage 3b, paroxysmal A. fib, hypertension, obesity, coronary artery disease, chronic diastolic CHF, anemia who presents to the emergency department with complaints of cold-like symptoms, cough, fatigue, poor appetite, for last few days. In the ED, Chest x-ray done in the emergency department showed multifocal pneumonia. Covid testing confirms COVID pna  Assessment & Plan:   Active Problems:   DM (diabetes mellitus), type 2 with peripheral vascular complications (HCC)   AVM (arteriovenous malformation) of small bowel, acquired with hemorrhage   Iron deficiency anemia due to chronic blood loss   Gout   Persistent atrial fibrillation   Hyperlipidemia   Hypothyroidism   Shortness of breath   Chronic diastolic CHF (congestive heart failure) (HCC)   CKD (chronic kidney disease), stage III   Dyspnea   COVID Multifocal pneumonia Acute hypoxic respiratory failure Currently still requiring about 6 L of O2 via [place], plan to wean down Chest imaging reviewed, findings of new R>L mixed interstitial airspace disease Inflammatory markers uptrending D-dimer trending up, bilateral lower extremity Dopplers negative for DVT Pt on remdesivir and decadron, anticipate 5 and 10 days tx, respectively Status post convalescent plasma Unable to receive Actemra due to recent use of secukinumab to treat psoriasis Monitor closely  GI bleed History of AVMs FOBT positive Hemoglobin currently stable around 8, baseline around 10-11 GI on board, recommendation for endoscopy later after covid PNA resolves Daily CBC  ??Staph coagulase-negative bacteremia Currently afebrile Repeat BC x2 pending Discussed with ID Dr. Megan Salon on 08/30/2019, will follow patient.  He will contact the lab for specification to determine if true  bacteremia or contaminant Continue IV Ancef for now  Coronary artery disease Currently chest pain-free Last cath was in 2018 after NSTEMI, DES to OM1 Continue on aspirin, beta-blockers, statin  Paroxysmal A. fib  Heart rate controlled  Continue amiodarone, metoprolol and diltiazem.   Not on anticoagulation due to history of GI bleed  Diastolic CHF Appears euvolemic Echocardiogram on June 2020 showed ejection fraction of 66-65% .Previous Echo had shown grade 2 diastolic dysfunction.  Diuretics held at time of admit  Hypertension Continue current medications.  Hyperlipidemia Continue on lipitor as tolerated  Hypothyroidism Continue Synthyroid  Diabetes mellitus type 2 A1c 5.7 on 09/16/2019 SSI, Lantus, Accu-Cheks, hypoglycemic protocol  CKD stage IIIb-IV:  Baseline creatinine is around 2, slowing trending up UA, renal USS pending  H/O psoriasis: Continued on cosentyx  Obesity Lifestyle modification advised     DVT prophylaxis: SCD's Code Status: Full Family Communication: Pt in room, family not at bedside Disposition Plan: Patient from home, still requiring about 6 L of O2, plan for home once completed remdesivir and improvement in oxygen requirement as well as GI signing off.  Consultants:   GI  Procedures:   None  Antimicrobials: Anti-infectives (From admission, onward)   Start     Dose/Rate Route Frequency Ordered Stop   08/29/19 1400  ceFAZolin (ANCEF) IVPB 1 g/50 mL premix     1 g 100 mL/hr over 30 Minutes Intravenous Every 8 hours 08/29/19 1239     08/29/19 1000  remdesivir 100 mg in sodium chloride 0.9 % 100 mL IVPB     100 mg 200 mL/hr over 30 Minutes Intravenous Daily 09/18/2019 1701 09/02/19 0959   09/05/2019 1800  remdesivir 200 mg in sodium chloride 0.9% 250  mL IVPB     200 mg 580 mL/hr over 30 Minutes Intravenous Once 09/12/2019 1701 09/05/2019 1851      Subjective: Still significantly short of breath, with significant coughing  spells.  Denies any melena, BRBPR, chest pain, nausea/vomiting, abdominal pain, fever/chills.  Objective: Vitals:   08/29/19 2253 08/29/19 2311 08/30/19 0112 08/30/19 0432  BP: 137/75 (!) 134/59 (!) 146/62 (!) 145/60  Pulse: 65 62 63 67  Resp: (!) 24 (!) 22 (!) 24 (!) 24  Temp: 97.8 F (36.6 C) 98.2 F (36.8 C) (!) 97.5 F (36.4 C) 98.2 F (36.8 C)  TempSrc: Oral Oral Oral Oral  SpO2: 95% 96% 93% 93%  Weight:      Height:        Intake/Output Summary (Last 24 hours) at 08/30/2019 1254 Last data filed at 08/30/2019 1219 Gross per 24 hour  Intake 1947.17 ml  Output 900 ml  Net 1047.17 ml   Filed Weights   09/10/2019 1830  Weight: 110.7 kg    Examination:  General: NAD   Cardiovascular: S1, S2 present  Respiratory:  Diminished breath sounds bilaterally  Abdomen: Soft, nontender, nondistended, bowel sounds present  Musculoskeletal: No bilateral pedal edema noted  Skin: Normal  Psychiatry: Normal mood   Data Reviewed: I have personally reviewed following labs and imaging studies  CBC: Recent Labs  Lab 09/10/2019 1113 08/29/19 0337 08/30/19 0429  WBC 5.6 5.1 6.4  NEUTROABS 5.0 4.4 5.6  HGB 8.6* 7.9* 8.2*  HCT 27.9* 25.6* 26.5*  MCV 87.7 88.6 88.0  PLT 224 251 761   Basic Metabolic Panel: Recent Labs  Lab 09/21/2019 1113 08/29/19 0337 08/30/19 0429  NA 138 136 137  K 4.0 4.4 4.8  CL 106 105 105  CO2 22 21* 23  GLUCOSE 200* 187* 156*  BUN 48* 55* 56*  CREATININE 2.38* 2.34* 2.54*  CALCIUM 7.6* 7.5* 7.6*   GFR: Estimated Creatinine Clearance: 31.3 mL/min (A) (by C-G formula based on SCr of 2.54 mg/dL (H)). Liver Function Tests: Recent Labs  Lab 09/07/2019 1113 08/29/19 0337 08/30/19 0429  AST 39 31 35  ALT 23 20 19   ALKPHOS 61 63 74  BILITOT 1.0 0.9 0.7  PROT 6.4* 5.9* 5.7*  ALBUMIN 2.8* 2.7* 2.5*   No results for input(s): LIPASE, AMYLASE in the last 168 hours. No results for input(s): AMMONIA in the last 168 hours. Coagulation Profile: No  results for input(s): INR, PROTIME in the last 168 hours. Cardiac Enzymes: No results for input(s): CKTOTAL, CKMB, CKMBINDEX, TROPONINI in the last 168 hours. BNP (last 3 results) No results for input(s): PROBNP in the last 8760 hours. HbA1C: Recent Labs    09/16/2019 1640  HGBA1C 5.7*   CBG: Recent Labs  Lab 08/29/19 1131 08/29/19 1640 08/29/19 1948 08/30/19 0745 08/30/19 1131  GLUCAP 150* 173* 169* 130* 213*   Lipid Profile: Recent Labs    09/05/2019 1113  TRIG 133   Thyroid Function Tests: No results for input(s): TSH, T4TOTAL, FREET4, T3FREE, THYROIDAB in the last 72 hours. Anemia Panel: Recent Labs    09/13/2019 1419 08/29/2019 1419 08/29/19 0337 08/30/19 0429  FERRITIN 814*   < > 610* 924*  TIBC 237*  --   --   --   IRON 22*  --   --   --    < > = values in this interval not displayed.   Sepsis Labs: Recent Labs  Lab 09/09/2019 1113  PROCALCITON 0.19  LATICACIDVEN 1.8    Recent Results (  from the past 240 hour(s))  Blood Culture (routine x 2)     Status: None (Preliminary result)   Collection Time: 09/16/2019 11:13 AM   Specimen: BLOOD  Result Value Ref Range Status   Specimen Description   Final    BLOOD RIGHT ANTECUBITAL Performed at [facility] [address], [place], [place] [address]    Special Requests   Final    BOTTLES DRAWN AEROBIC AND ANAEROBIC Blood Culture adequate volume Performed at [facility] [address], [place], [place] [address]    Culture  Setup Time   Final    GRAM POSITIVE COCCI AEROBIC BOTTLE ONLY CRITICAL RESULT CALLED TO, READ BACK BY AND VERIFIED WITH: PHARMD M SWEYNE [id number] AT 30 AM BY CM Performed at [facility], [place] [address], [place], [place] [address]    Culture GRAM POSITIVE COCCI  Final   Report Status PENDING  Incomplete  Respiratory Panel by RT PCR (Flu A&B, Covid) - Nasopharyngeal Swab     Status: Abnormal   Collection Time: 09/23/2019 12:44 PM   Specimen:  Nasopharyngeal Swab  Result Value Ref Range Status   SARS Coronavirus 2 by RT PCR POSITIVE (A) NEGATIVE Final    Comment: RESULT CALLED TO, READ BACK BY AND VERIFIED WITH: C.GRIFFITH AT 1607 ON 09/24/2019 BY N.THOMPSON (NOTE) SARS-CoV-2 target nucleic acids are DETECTED. SARS-CoV-2 RNA is generally detectable in upper respiratory specimens  during the acute phase of infection. Positive results are indicative of the presence of the identified virus, but do not rule out bacterial infection or co-infection with other pathogens not detected by the test. Clinical correlation with patient history and other diagnostic information is necessary to determine patient infection status. The expected result is Negative. Fact Sheet for Patients:  PinkCheek.be Fact Sheet for Healthcare Providers: GravelBags.it This test is not yet approved or cleared by the [place] FDA and  has been authorized for detection and/or diagnosis of SARS-CoV-2 by FDA under an Emergency Use Authorization (EUA).  This EUA will remain in effect (meaning this test can be  used) for the duration of  the COVID-19 declaration under Section 564(b)(1) of the Act, 21 U.S.C. section 360bbb-3(b)(1), unless the authorization is terminated or revoked sooner.    Influenza A by PCR NEGATIVE NEGATIVE Final   Influenza B by PCR NEGATIVE NEGATIVE Final    Comment: (NOTE) The Xpert Xpress SARS-CoV-2/FLU/RSV assay is intended as an aid in  the diagnosis of influenza from Nasopharyngeal swab specimens and  should not be used as a sole basis for treatment. Nasal washings and  aspirates are unacceptable for Xpert Xpress SARS-CoV-2/FLU/RSV  testing. Fact Sheet for Patients: PinkCheek.be Fact Sheet for Healthcare Providers: GravelBags.it This test is not yet approved or cleared by the [place] FDA and  has been authorized  for detection and/or diagnosis of SARS-CoV-2 by  FDA under an Emergency Use Authorization (EUA). This EUA will remain  in effect (meaning this test can be used) for the duration of the  Covid-19 declaration under Section 564(b)(1) of the Act, 21  U.S.C. section 360bbb-3(b)(1), unless the authorization is  terminated or revoked. Performed at [facility], [place] [address] [place], [place] [address]   Blood Culture (routine x 2)     Status: Abnormal (Preliminary result)   Collection Time: 09/17/2019  5:17 PM   Specimen: BLOOD  Result Value Ref Range Status   Specimen Description BLOOD LEFT ANTECUBITAL  Final   Special Requests  Final    BOTTLES DRAWN AEROBIC AND ANAEROBIC Blood Culture results may not be optimal due to an excessive volume of blood received in culture bottles   Culture  Setup Time   Final    GRAM POSITIVE COCCI IN BOTH AEROBIC AND ANAEROBIC BOTTLES CRITICAL VALUE NOTED.  VALUE IS CONSISTENT WITH PREVIOUSLY REPORTED AND CALLED VALUE.    Culture (A)  Final    STAPHYLOCOCCUS SPECIES (COAGULASE NEGATIVE) THE SIGNIFICANCE OF ISOLATING THIS ORGANISM FROM A SINGLE SET OF BLOOD CULTURES WHEN MULTIPLE SETS ARE DRAWN IS UNCERTAIN. PLEASE NOTIFY THE MICROBIOLOGY DEPARTMENT WITHIN ONE WEEK IF SPECIATION AND SENSITIVITIES ARE REQUIRED. Performed at [facility], [place] [address], [place], [place] [address]    Report Status PENDING  Incomplete  Blood Culture ID Panel (Reflexed)     Status: Abnormal   Collection Time: 09/17/2019  5:17 PM  Result Value Ref Range Status   Enterococcus species NOT DETECTED NOT DETECTED Final   Listeria monocytogenes NOT DETECTED NOT DETECTED Final   Staphylococcus species DETECTED (A) NOT DETECTED Final    Comment: Methicillin (oxacillin) susceptible coagulase negative staphylococcus. Possible blood culture contaminant (unless isolated from more than one blood culture draw or clinical case suggests pathogenicity). No antibiotic  treatment is indicated for blood  culture contaminants. CRITICAL RESULT CALLED TO, READ BACK BY AND VERIFIED WITH: Shelda Jakes PharmD 12:30 08/29/19 ([email or web address])    Staphylococcus aureus (BCID) NOT DETECTED NOT DETECTED Final   Methicillin resistance NOT DETECTED NOT DETECTED Final   Streptococcus species NOT DETECTED NOT DETECTED Final   Streptococcus agalactiae NOT DETECTED NOT DETECTED Final   Streptococcus pneumoniae NOT DETECTED NOT DETECTED Final   Streptococcus pyogenes NOT DETECTED NOT DETECTED Final   Acinetobacter baumannii NOT DETECTED NOT DETECTED Final   Enterobacteriaceae species NOT DETECTED NOT DETECTED Final   Enterobacter cloacae complex NOT DETECTED NOT DETECTED Final   Escherichia coli NOT DETECTED NOT DETECTED Final   Klebsiella oxytoca NOT DETECTED NOT DETECTED Final   Klebsiella pneumoniae NOT DETECTED NOT DETECTED Final   Proteus species NOT DETECTED NOT DETECTED Final   Serratia marcescens NOT DETECTED NOT DETECTED Final   Haemophilus influenzae NOT DETECTED NOT DETECTED Final   Neisseria meningitidis NOT DETECTED NOT DETECTED Final   Pseudomonas aeruginosa NOT DETECTED NOT DETECTED Final   Candida albicans NOT DETECTED NOT DETECTED Final   Candida glabrata NOT DETECTED NOT DETECTED Final   Candida krusei NOT DETECTED NOT DETECTED Final   Candida parapsilosis NOT DETECTED NOT DETECTED Final   Candida tropicalis NOT DETECTED NOT DETECTED Final    Comment: Performed at [facility], [address] [place], [place] [address]  Expectorated sputum assessment w rflx to resp cult     Status: None   Collection Time: 08/29/19  5:46 PM   Specimen: Sputum  Result Value Ref Range Status   Specimen Description SPU  Final   Special Requests NONE  Final   Sputum evaluation   Final    Sputum specimen not acceptable for testing.  Please recollect.   Performed at [facility], [place] [address] [place], [place] [address]    Report Status 08/29/2019 FINAL   Final     Radiology Studies: VAS [name] LOWER EXTREMITY VENOUS (DVT)  Result Date: 08/30/2019  Lower Venous DVTStudy Indications: SOB. Other Indications: Covid+. Risk Factors: Obesity and A fib, DM, HTN, HLD, CAD. Limitations: Body habitus and poor ultrasound/tissue interface. Comparison Study: No prior exam. Performing Technologist: Baldwin Crown ARDMS, RVT  Examination Guidelines: A complete evaluation includes B-mode imaging, spectral Doppler, color Doppler, and power Doppler as needed of all accessible portions of each vessel. Bilateral testing is considered an integral part of a complete examination. Limited examinations for reoccurring indications may be performed as noted. The reflux portion of the exam is performed with the patient in reverse Trendelenburg.  +---------+---------------+---------+-----------+----------+--------------+ RIGHT    CompressibilityPhasicitySpontaneityPropertiesThrombus Aging +---------+---------------+---------+-----------+----------+--------------+ CFV      Full           Yes      Yes                                 +---------+---------------+---------+-----------+----------+--------------+ SFJ      Full                                                        +---------+---------------+---------+-----------+----------+--------------+ FV Prox  Full                                                        +---------+---------------+---------+-----------+----------+--------------+ FV Mid   Full                                                        +---------+---------------+---------+-----------+----------+--------------+ FV DistalFull                                                        +---------+---------------+---------+-----------+----------+--------------+ PFV      Full                                                        +---------+---------------+---------+-----------+----------+--------------+ POP      Full            Yes      Yes                                 +---------+---------------+---------+-----------+----------+--------------+ PTV      Full                                                        +---------+---------------+---------+-----------+----------+--------------+ PERO     Full                                                        +---------+---------------+---------+-----------+----------+--------------+   +---------+---------------+---------+-----------+----------+--------------+  LEFT     CompressibilityPhasicitySpontaneityPropertiesThrombus Aging +---------+---------------+---------+-----------+----------+--------------+ CFV      Full           Yes      Yes                                 +---------+---------------+---------+-----------+----------+--------------+ SFJ      Full                                                        +---------+---------------+---------+-----------+----------+--------------+ FV Prox  Full                                                        +---------+---------------+---------+-----------+----------+--------------+ FV Mid   Full                                                        +---------+---------------+---------+-----------+----------+--------------+ FV DistalFull                                                        +---------+---------------+---------+-----------+----------+--------------+ PFV      Full                                                        +---------+---------------+---------+-----------+----------+--------------+ POP      Full           Yes      Yes                                 +---------+---------------+---------+-----------+----------+--------------+ PTV      Full                                                        +---------+---------------+---------+-----------+----------+--------------+ PERO     Full                                                         +---------+---------------+---------+-----------+----------+--------------+ Poorly visualized bilateral calf veins due to body habitus,    Summary: RIGHT: - There is no evidence of deep vein thrombosis in the lower extremity.  - No cystic structure found in the popliteal fossa.  LEFT: - There is no evidence of deep vein  thrombosis in the lower extremity.  - No cystic structure found in the popliteal fossa.  *See table(s) above for measurements and observations.    Preliminary     Scheduled Meds: . allopurinol  200 mg Oral Daily  . amiodarone  200 mg Oral Once per day on Mon Tue Wed Thu Fri Sat  . vitamin C  500 mg Oral Daily  . atorvastatin  40 mg Oral Daily  . dexamethasone (DECADRON) injection  6 mg Intravenous Q24H  . diltiazem  180 mg Oral Daily  . fenofibrate  160 mg Oral Daily  . ferrous sulfate  325 mg Oral Daily  . hydrocortisone  25 mg Rectal QHS  . insulin aspart  0-5 Units Subcutaneous QHS  . insulin aspart  0-9 Units Subcutaneous TID WC  . insulin glargine  25 Units Subcutaneous Daily  . levothyroxine  150 mcg Oral QAC breakfast  . metoprolol tartrate  12.5 mg Oral BID  . zinc sulfate  220 mg Oral Daily   Continuous Infusions: .  ceFAZolin (ANCEF) IV 1 g (08/30/19 0604)  . remdesivir 100 mg in NS 100 mL 100 mg (08/30/19 0905)     LOS: 2 days   Alma Friendly, MD [facility] Pager On Amion  If 7PM-7AM, please contact night-coverage 08/30/2019, 12:54 PM

## 2019-08-31 ENCOUNTER — Other Ambulatory Visit: Payer: Self-pay

## 2019-08-31 LAB — CBC WITH DIFFERENTIAL/PLATELET
Abs Immature Granulocytes: 0.07 10*3/uL (ref 0.00–0.07)
Basophils Absolute: 0 10*3/uL (ref 0.0–0.1)
Basophils Relative: 0 %
Eosinophils Absolute: 0 10*3/uL (ref 0.0–0.5)
Eosinophils Relative: 0 %
HCT: 25.8 % — ABNORMAL LOW (ref 39.0–52.0)
Hemoglobin: 8.1 g/dL — ABNORMAL LOW (ref 13.0–17.0)
Immature Granulocytes: 1 %
Lymphocytes Relative: 5 %
Lymphs Abs: 0.4 10*3/uL — ABNORMAL LOW (ref 0.7–4.0)
MCH: 27.8 pg (ref 26.0–34.0)
MCHC: 31.4 g/dL (ref 30.0–36.0)
MCV: 88.7 fL (ref 80.0–100.0)
Monocytes Absolute: 0.3 10*3/uL (ref 0.1–1.0)
Monocytes Relative: 4 %
Neutro Abs: 7.1 10*3/uL (ref 1.7–7.7)
Neutrophils Relative %: 90 %
Platelets: 246 10*3/uL (ref 150–400)
RBC: 2.91 MIL/uL — ABNORMAL LOW (ref 4.22–5.81)
RDW: 16.9 % — ABNORMAL HIGH (ref 11.5–15.5)
WBC: 7.8 10*3/uL (ref 4.0–10.5)
nRBC: 0 % (ref 0.0–0.2)

## 2019-08-31 LAB — GLUCOSE, CAPILLARY
Glucose-Capillary: 166 mg/dL — ABNORMAL HIGH (ref 70–99)
Glucose-Capillary: 168 mg/dL — ABNORMAL HIGH (ref 70–99)
Glucose-Capillary: 249 mg/dL — ABNORMAL HIGH (ref 70–99)
Glucose-Capillary: 265 mg/dL — ABNORMAL HIGH (ref 70–99)

## 2019-08-31 LAB — FERRITIN: Ferritin: 1158 ng/mL — ABNORMAL HIGH (ref 24–336)

## 2019-08-31 LAB — COMPREHENSIVE METABOLIC PANEL
ALT: 15 U/L (ref 0–44)
AST: 30 U/L (ref 15–41)
Albumin: 2.4 g/dL — ABNORMAL LOW (ref 3.5–5.0)
Alkaline Phosphatase: 69 U/L (ref 38–126)
Anion gap: 9 (ref 5–15)
BUN: 59 mg/dL — ABNORMAL HIGH (ref 8–23)
CO2: 23 mmol/L (ref 22–32)
Calcium: 7.7 mg/dL — ABNORMAL LOW (ref 8.9–10.3)
Chloride: 106 mmol/L (ref 98–111)
Creatinine, Ser: 2.29 mg/dL — ABNORMAL HIGH (ref 0.61–1.24)
GFR calc Af Amer: 31 mL/min — ABNORMAL LOW (ref >60)
GFR calc non Af Amer: 27 mL/min — ABNORMAL LOW (ref >60)
Glucose, Bld: 194 mg/dL — ABNORMAL HIGH (ref 70–99)
Potassium: 4.8 mmol/L (ref 3.5–5.1)
Sodium: 138 mmol/L (ref 135–145)
Total Bilirubin: 0.6 mg/dL (ref 0.3–1.2)
Total Protein: 5.8 g/dL — ABNORMAL LOW (ref 6.5–8.1)

## 2019-08-31 LAB — D-DIMER, QUANTITATIVE: D-Dimer, Quant: 3.9 ug/mL-FEU — ABNORMAL HIGH (ref 0.00–0.50)

## 2019-08-31 LAB — C-REACTIVE PROTEIN: CRP: 17.6 mg/dL — ABNORMAL HIGH (ref <1.0)

## 2019-08-31 MED ORDER — CEFAZOLIN SODIUM-DEXTROSE 2-4 GM/100ML-% IV SOLN
2.0000 g | Freq: Three times a day (TID) | INTRAVENOUS | Status: AC
  Administered 2019-08-31 – 2019-09-04 (×14): 2 g via INTRAVENOUS
  Filled 2019-08-31 (×15): qty 100

## 2019-08-31 MED ORDER — SENNOSIDES-DOCUSATE SODIUM 8.6-50 MG PO TABS: 2.0000 | ORAL_TABLET | Freq: Every evening | ORAL | Status: DC | PRN

## 2019-08-31 MED ORDER — IPRATROPIUM-ALBUTEROL 20-100 MCG/ACT IN AERS
1.0000 | INHALATION_SPRAY | Freq: Four times a day (QID) | RESPIRATORY_TRACT | Status: DC
  Administered 2019-08-31 – 2019-09-09 (×34): 1 via RESPIRATORY_TRACT
  Filled 2019-08-31: qty 4

## 2019-08-31 MED ORDER — POLYETHYLENE GLYCOL 3350 17 G PO PACK
17.0000 g | PACK | Freq: Every day | ORAL | Status: DC | PRN
  Filled 2019-08-31: qty 1

## 2019-08-31 MED ORDER — FUROSEMIDE 20 MG PO TABS
40.0000 mg | ORAL_TABLET | Freq: Every day | ORAL | Status: DC
  Administered 2019-08-31 – 2019-09-04 (×5): 40 mg via ORAL
  Filled 2019-08-31 (×2): qty 2
  Filled 2019-08-31: qty 1
  Filled 2019-08-31 (×2): qty 2

## 2019-08-31 NOTE — Progress Notes (Signed)
Report called to [facility] nurse.

## 2019-08-31 NOTE — Progress Notes (Signed)
   08/31/19 1600  Family/Significant Other Communication  Family/Significant Other Update Called;Updated  talked to wife

## 2019-08-31 NOTE — Progress Notes (Signed)
PROGRESS NOTE    Michael [name]  CHY:[id number] DOB: 04/20/1941 DOA: 09/21/2019 PCP: Cassandria Anger, MD    Brief Narrative:  79 y.o. male with medical history significant of CKD stage 3b, paroxysmal A. fib, hypertension, obesity, coronary artery disease, chronic diastolic CHF, anemia who presents to the emergency department with complaints of cold-like symptoms, cough, fatigue, poor appetite, for last few days. In the ED, Chest x-ray done in the emergency department showed multifocal pneumonia. Covid testing confirms COVID pna  Assessment & Plan:   Principal Problem:   COVID-19 virus infection Active Problems:   DM (diabetes mellitus), type 2 with peripheral vascular complications ([place])   AVM (arteriovenous malformation) of small bowel, acquired with hemorrhage   Iron deficiency anemia due to chronic blood loss   Gout   Persistent atrial fibrillation   Hyperlipidemia   Hypothyroidism   Shortness of breath   Chronic diastolic CHF (congestive heart failure) (HCC)   CKD (chronic kidney disease), stage III   Dyspnea   Positive blood cultures   COVID Multifocal pneumonia Acute hypoxic respiratory failure Currently still requiring about 6 L of O2 via [place], but desaturates to the 70s upon any attempts to ambulate Chest imaging reviewed, findings of new R>L mixed interstitial airspace disease Inflammatory markers uptrending D-dimer trended up, bilateral lower extremity Dopplers negative for DVT Unable to perform CTA chest due to CKD, also pt with significant hx of GIB (AVMs) Pt on remdesivir and decadron Status post convalescent plasma Unable to receive Actemra due to recent use of secukinumab to treat psoriasis Monitor closely  GI bleed History of AVMs FOBT positive Hemoglobin currently stable around 8, baseline around 10-11 GI on board, recommendation for endoscopy later after covid PNA resolves, signed off, available prn Daily CBC  ??Staph coagulase-negative  bacteremia Currently afebrile Repeat BC x2 pending Discussed with ID Dr. Megan Salon on 08/30/2019, will follow patient.  He will contact the lab for specification to determine if true bacteremia or contaminant Continue IV Ancef for now  Coronary artery disease Currently chest pain-free Last cath was in 2018 after NSTEMI, DES to OM1 Continue on aspirin, beta-blockers, statin  Paroxysmal A. fib  Heart rate controlled  Continue amiodarone, metoprolol and diltiazem.   Not on anticoagulation due to history of GI bleed  Diastolic CHF Echocardiogram on June 2020 showed ejection fraction of 66-65% .Previous Echo had shown grade 2 diastolic dysfunction.  Diuretics held at time of admit, will restart 40 mg daily for now and monitor CMP closely  Hypertension Continue current medications.  Hyperlipidemia Continue on lipitor as tolerated  Hypothyroidism Continue Synthyroid  Diabetes mellitus type 2 A1c 5.7 on 09/21/2019 SSI, Lantus, Accu-Cheks, hypoglycemic protocol  CKD stage IIIb-IV:  Baseline creatinine is around 2, slowing trending up UA pending , renal USS unremarkable  H/O psoriasis: Continued on cosentyx  Obesity Lifestyle modification advised     DVT prophylaxis: SCD's Code Status: Full Family Communication: Pt in room, family not at bedside Disposition Plan: Patient from home, still requiring about 6 L of O2, plan for home once completed remdesivir and improvement in oxygen requirement.  Transferred to [facility]  Consultants:   GI  Procedures:   None  Antimicrobials: Anti-infectives (From admission, onward)   Start     Dose/Rate Route Frequency Ordered Stop   08/31/19 1400  ceFAZolin (ANCEF) IVPB 2g/100 mL premix     2 g 200 mL/hr over 30 Minutes Intravenous Every 8 hours 08/31/19 0937     08/29/19 1400  ceFAZolin (ANCEF)  IVPB 1 g/50 mL premix  Status:  Discontinued     1 g 100 mL/hr over 30 Minutes Intravenous Every 8 hours 08/29/19 1239 08/31/19 0937    08/29/19 1000  remdesivir 100 mg in sodium chloride 0.9 % 100 mL IVPB     100 mg 200 mL/hr over 30 Minutes Intravenous Daily 08/29/2019 1701 09/02/19 0959   09/04/2019 1800  remdesivir 200 mg in sodium chloride 0.9% 250 mL IVPB     200 mg 580 mL/hr over 30 Minutes Intravenous Once 09/21/2019 1701 09/03/2019 1851      Subjective: Patient still significantly short of breath especially on ambulation.  Denies any chest pain, fever/chills, BRBPR, melena.  Objective: Vitals:   08/30/19 1403 08/30/19 2133 08/31/19 0316 08/31/19 0537  BP: (!) 150/54 139/65 (!) 145/66 (!) 143/108  Pulse: 66 66 (!) 56 (!) 58  Resp: 16 19  20   Temp: 97.9 F (36.6 C) 98.2 F (36.8 C)  97.6 F (36.4 C)  TempSrc: Oral Oral  Oral  SpO2: 93% 97%  93%  Weight:      Height:        Intake/Output Summary (Last 24 hours) at 08/31/2019 1301 Last data filed at 08/31/2019 3762 Gross per 24 hour  Intake 600 ml  Output 650 ml  Net -50 ml   Filed Weights   08/29/2019 1830  Weight: 110.7 kg    Examination:  General: NAD   Cardiovascular: S1, S2 present  Respiratory:  Diminished breath sounds bilaterally  Abdomen: Soft, nontender, nondistended, bowel sounds present  Musculoskeletal: No bilateral pedal edema noted  Skin:  Bilateral chronic venous stasis changes on bilateral lower extremity  Psychiatry: Normal mood   Data Reviewed: I have personally reviewed following labs and imaging studies  CBC: Recent Labs  Lab 09/14/2019 1113 08/29/19 0337 08/30/19 0429 08/31/19 0445  WBC 5.6 5.1 6.4 7.8  NEUTROABS 5.0 4.4 5.6 7.1  HGB 8.6* 7.9* 8.2* 8.1*  HCT 27.9* 25.6* 26.5* 25.8*  MCV 87.7 88.6 88.0 88.7  PLT 224 251 243 831   Basic Metabolic Panel: Recent Labs  Lab 09/24/2019 1113 08/29/19 0337 08/30/19 0429 08/31/19 0445  NA 138 136 137 138  K 4.0 4.4 4.8 4.8  CL 106 105 105 106  CO2 22 21* 23 23  GLUCOSE 200* 187* 156* 194*  BUN 48* 55* 56* 59*  CREATININE 2.38* 2.34* 2.54* 2.29*  CALCIUM 7.6* 7.5*  7.6* 7.7*   GFR: Estimated Creatinine Clearance: 34.7 mL/min (A) (by C-G formula based on SCr of 2.29 mg/dL (H)). Liver Function Tests: Recent Labs  Lab 09/22/2019 1113 08/29/19 0337 08/30/19 0429 08/31/19 0445  AST 39 31 35 30  ALT 23 20 19 15   ALKPHOS 61 63 74 69  BILITOT 1.0 0.9 0.7 0.6  PROT 6.4* 5.9* 5.7* 5.8*  ALBUMIN 2.8* 2.7* 2.5* 2.4*   No results for input(s): LIPASE, AMYLASE in the last 168 hours. No results for input(s): AMMONIA in the last 168 hours. Coagulation Profile: No results for input(s): INR, PROTIME in the last 168 hours. Cardiac Enzymes: No results for input(s): CKTOTAL, CKMB, CKMBINDEX, TROPONINI in the last 168 hours. BNP (last 3 results) No results for input(s): PROBNP in the last 8760 hours. HbA1C: Recent Labs    08/30/2019 1640  HGBA1C 5.7*   CBG: Recent Labs  Lab 08/30/19 1131 08/30/19 1634 08/30/19 2131 08/31/19 0719 08/31/19 1142  GLUCAP 213* 328* 220* 166* 168*   Lipid Profile: No results for input(s): CHOL, HDL, LDLCALC, TRIG, CHOLHDL,  LDLDIRECT in the last 72 hours. Thyroid Function Tests: No results for input(s): TSH, T4TOTAL, FREET4, T3FREE, THYROIDAB in the last 72 hours. Anemia Panel: Recent Labs    09/14/2019 1419 08/29/19 0337 08/30/19 0429 08/31/19 0445  FERRITIN 814*   < > 924* 1,158*  TIBC 237*  --   --   --   IRON 22*  --   --   --    < > = values in this interval not displayed.   Sepsis Labs: Recent Labs  Lab 09/18/2019 1113  PROCALCITON 0.19  LATICACIDVEN 1.8    Recent Results (from the past 240 hour(s))  Blood Culture (routine x 2)     Status: Abnormal (Preliminary result)   Collection Time: 09/07/2019 11:13 AM   Specimen: BLOOD  Result Value Ref Range Status   Specimen Description   Final    BLOOD RIGHT ANTECUBITAL Performed at [facility] [address], [place], [place] [address]    Special Requests   Final    BOTTLES DRAWN AEROBIC AND ANAEROBIC Blood Culture adequate  volume Performed at [facility] [address], [place], [place] [address]    Culture  Setup Time   Final    GRAM POSITIVE COCCI IN BOTH AEROBIC AND ANAEROBIC BOTTLES CRITICAL RESULT CALLED TO, READ BACK BY AND VERIFIED WITH: PHARMD M SWEYNE [id number] AT 68 AM BY CM    Culture (A)  Final    STAPHYLOCOCCUS EPIDERMIDIS SUSCEPTIBILITIES TO FOLLOW Performed at [facility], [place] [address] [place], [place] [address]    Report Status PENDING  Incomplete  Respiratory Panel by RT PCR (Flu A&B, Covid) - Nasopharyngeal Swab     Status: Abnormal   Collection Time: 09/04/2019 12:44 PM   Specimen: Nasopharyngeal Swab  Result Value Ref Range Status   SARS Coronavirus 2 by RT PCR POSITIVE (A) NEGATIVE Final    Comment: RESULT CALLED TO, READ BACK BY AND VERIFIED WITH: C.GRIFFITH AT 1607 ON 09/03/2019 BY N.THOMPSON (NOTE) SARS-CoV-2 target nucleic acids are DETECTED. SARS-CoV-2 RNA is generally detectable in upper respiratory specimens  during the acute phase of infection. Positive results are indicative of the presence of the identified virus, but do not rule out bacterial infection or co-infection with other pathogens not detected by the test. Clinical correlation with patient history and other diagnostic information is necessary to determine patient infection status. The expected result is Negative. Fact Sheet for Patients:  PinkCheek.be Fact Sheet for Healthcare Providers: GravelBags.it This test is not yet approved or cleared by the [place] FDA and  has been authorized for detection and/or diagnosis of SARS-CoV-2 by FDA under an Emergency Use Authorization (EUA).  This EUA will remain in effect (meaning this test can be  used) for the duration of  the COVID-19 declaration under Section 564(b)(1) of the Act, 21 U.S.C. section 360bbb-3(b)(1), unless the authorization is terminated or revoked sooner.     Influenza A by PCR NEGATIVE NEGATIVE Final   Influenza B by PCR NEGATIVE NEGATIVE Final    Comment: (NOTE) The Xpert Xpress SARS-CoV-2/FLU/RSV assay is intended as an aid in  the diagnosis of influenza from Nasopharyngeal swab specimens and  should not be used as a sole basis for treatment. Nasal washings and  aspirates are unacceptable for Xpert Xpress SARS-CoV-2/FLU/RSV  testing. Fact Sheet for Patients: PinkCheek.be Fact Sheet for Healthcare Providers: GravelBags.it This test is not yet approved or cleared by the [place] and  has been authorized for  detection and/or diagnosis of SARS-CoV-2 by  FDA under an Emergency Use Authorization (EUA). This EUA will remain  in effect (meaning this test can be used) for the duration of the  Covid-19 declaration under Section 564(b)(1) of the Act, 21  U.S.C. section 360bbb-3(b)(1), unless the authorization is  terminated or revoked. Performed at [facility], [place] [address], [place], [place] [address]   Blood Culture (routine x 2)     Status: Abnormal (Preliminary result)   Collection Time: 09/10/2019  5:17 PM   Specimen: BLOOD  Result Value Ref Range Status   Specimen Description BLOOD LEFT ANTECUBITAL  Final   Special Requests   Final    BOTTLES DRAWN AEROBIC AND ANAEROBIC Blood Culture results may not be optimal due to an excessive volume of blood received in culture bottles   Culture  Setup Time   Final    GRAM POSITIVE COCCI IN BOTH AEROBIC AND ANAEROBIC BOTTLES CRITICAL VALUE NOTED.  VALUE IS CONSISTENT WITH PREVIOUSLY REPORTED AND CALLED VALUE.    Culture (A)  Final    STAPHYLOCOCCUS EPIDERMIDIS SUSCEPTIBILITIES TO FOLLOW STAPHYLOCOCCUS HOMINIS THE SIGNIFICANCE OF ISOLATING THIS ORGANISM FROM A SINGLE SET OF BLOOD CULTURES WHEN MULTIPLE SETS ARE DRAWN IS UNCERTAIN. PLEASE NOTIFY THE MICROBIOLOGY DEPARTMENT WITHIN ONE WEEK IF SPECIATION AND  SENSITIVITIES ARE REQUIRED. Performed at [facility], [place] [address] [place], [place] [address]    Report Status PENDING  Incomplete  Blood Culture ID Panel (Reflexed)     Status: Abnormal   Collection Time: 09/03/2019  5:17 PM  Result Value Ref Range Status   Enterococcus species NOT DETECTED NOT DETECTED Final   Listeria monocytogenes NOT DETECTED NOT DETECTED Final   Staphylococcus species DETECTED (A) NOT DETECTED Final    Comment: Methicillin (oxacillin) susceptible coagulase negative staphylococcus. Possible blood culture contaminant (unless isolated from more than one blood culture draw or clinical case suggests pathogenicity). No antibiotic treatment is indicated for blood  culture contaminants. CRITICAL RESULT CALLED TO, READ BACK BY AND VERIFIED WITH: Shelda Jakes PharmD 12:30 08/29/19 ([email or web address])    Staphylococcus aureus (BCID) NOT DETECTED NOT DETECTED Final   Methicillin resistance NOT DETECTED NOT DETECTED Final   Streptococcus species NOT DETECTED NOT DETECTED Final   Streptococcus agalactiae NOT DETECTED NOT DETECTED Final   Streptococcus pneumoniae NOT DETECTED NOT DETECTED Final   Streptococcus pyogenes NOT DETECTED NOT DETECTED Final   Acinetobacter baumannii NOT DETECTED NOT DETECTED Final   Enterobacteriaceae species NOT DETECTED NOT DETECTED Final   Enterobacter cloacae complex NOT DETECTED NOT DETECTED Final   Escherichia coli NOT DETECTED NOT DETECTED Final   Klebsiella oxytoca NOT DETECTED NOT DETECTED Final   Klebsiella pneumoniae NOT DETECTED NOT DETECTED Final   Proteus species NOT DETECTED NOT DETECTED Final   Serratia marcescens NOT DETECTED NOT DETECTED Final   Haemophilus influenzae NOT DETECTED NOT DETECTED Final   Neisseria meningitidis NOT DETECTED NOT DETECTED Final   Pseudomonas aeruginosa NOT DETECTED NOT DETECTED Final   Candida albicans NOT DETECTED NOT DETECTED Final   Candida glabrata NOT DETECTED NOT DETECTED Final   Candida krusei NOT  DETECTED NOT DETECTED Final   Candida parapsilosis NOT DETECTED NOT DETECTED Final   Candida tropicalis NOT DETECTED NOT DETECTED Final    Comment: Performed at [facility], [address], [place],  [address]  Expectorated sputum assessment w rflx to resp cult     Status: None   Collection Time: 08/29/19  5:46 PM  Specimen: Sputum  Result Value Ref Range Status   Specimen Description SPU  Final   Special Requests NONE  Final   Sputum evaluation   Final    Sputum specimen not acceptable for testing.  Please recollect.   Performed at [facility], [place] [address], [place], [place] [address]    Report Status 08/29/2019 FINAL  Final  Culture, blood (routine x 2)     Status: None (Preliminary result)   Collection Time: 08/30/19  2:21 PM   Specimen: BLOOD RIGHT FOREARM  Result Value Ref Range Status   Specimen Description   Final    BLOOD RIGHT FOREARM Performed at [facility] [address], [place], [place] [address]    Special Requests   Final    BOTTLES DRAWN AEROBIC ONLY Blood Culture adequate volume Performed at [facility] [address], [place], [place] [address]    Culture   Final    NO GROWTH < 24 HOURS Performed at [facility] [address], [place], [place] [address]    Report Status PENDING  Incomplete  Culture, blood (routine x 2)     Status: None (Preliminary result)   Collection Time: 08/30/19  2:21 PM   Specimen: BLOOD  Result Value Ref Range Status   Specimen Description   Final    BLOOD RIGHT ANTECUBITAL Performed at [facility] [address], [place], [place] [address]    Special Requests   Final    BOTTLES DRAWN AEROBIC ONLY Blood Culture adequate volume Performed at [facility] [address], [place], [place] [address]    Culture   Final    NO GROWTH < 24 HOURS Performed at [facility] [address] [place],  [place] [address]    Report Status PENDING  Incomplete     Radiology Studies: US RENAL  Result Date: 08/30/2019 CLINICAL DATA:  Chronic renal disease, COVID-19 positivity EXAM: RENAL / URINARY TRACT ULTRASOUND COMPLETE COMPARISON:  03/18/2016 FINDINGS: Right Kidney: Renal measurements: 12.6 x 5.8 x 5.4 cm. = volume: 208 mL. Mild cortical thinning is noted. Normal echogenicity is seen. Left Kidney: Renal measurements: 13.4 x 5.3 x 5.2 cm. = volume: 195 mL. Mild cortical thinning is noted. Normal echogenicity is seen. 1.4 mm cyst is noted within the mid to lower left kidney. Bladder: Appears normal for degree of bladder distention. Other: None. IMPRESSION: Left renal cyst. Mild cortical thinning bilaterally. Electronically Signed   By: Inez Catalina M.D.   On: 08/30/2019 19:09   VAS [name] LOWER EXTREMITY VENOUS (DVT)  Result Date: 08/30/2019  Lower Venous DVTStudy Indications: SOB. Other Indications: Covid+. Risk Factors: Obesity and A fib, DM, HTN, HLD, CAD. Limitations: Body habitus and poor ultrasound/tissue interface. Comparison Study: No prior exam. Performing Technologist: Baldwin Crown ARDMS, RVT  Examination Guidelines: A complete evaluation includes B-mode imaging, spectral Doppler, color Doppler, and power Doppler as needed of all accessible portions of each vessel. Bilateral testing is considered an integral part of a complete examination. Limited examinations for reoccurring indications may be performed as noted. The reflux portion of the exam is performed with the patient in reverse Trendelenburg.  +---------+---------------+---------+-----------+----------+--------------+ RIGHT    CompressibilityPhasicitySpontaneityPropertiesThrombus Aging +---------+---------------+---------+-----------+----------+--------------+ CFV      Full           Yes      Yes                                 +---------+---------------+---------+-----------+----------+--------------+  SFJ      Full                                                         +---------+---------------+---------+-----------+----------+--------------+ FV Prox  Full                                                        +---------+---------------+---------+-----------+----------+--------------+ FV Mid   Full                                                        +---------+---------------+---------+-----------+----------+--------------+ FV DistalFull                                                        +---------+---------------+---------+-----------+----------+--------------+ PFV      Full                                                        +---------+---------------+---------+-----------+----------+--------------+ POP      Full           Yes      Yes                                 +---------+---------------+---------+-----------+----------+--------------+ PTV      Full                                                        +---------+---------------+---------+-----------+----------+--------------+ PERO     Full                                                        +---------+---------------+---------+-----------+----------+--------------+   +---------+---------------+---------+-----------+----------+--------------+ LEFT     CompressibilityPhasicitySpontaneityPropertiesThrombus Aging +---------+---------------+---------+-----------+----------+--------------+ CFV      Full           Yes      Yes                                 +---------+---------------+---------+-----------+----------+--------------+ SFJ      Full                                                        +---------+---------------+---------+-----------+----------+--------------+  FV Prox  Full                                                        +---------+---------------+---------+-----------+----------+--------------+ FV Mid   Full                                                         +---------+---------------+---------+-----------+----------+--------------+ FV DistalFull                                                        +---------+---------------+---------+-----------+----------+--------------+ PFV      Full                                                        +---------+---------------+---------+-----------+----------+--------------+ POP      Full           Yes      Yes                                 +---------+---------------+---------+-----------+----------+--------------+ PTV      Full                                                        +---------+---------------+---------+-----------+----------+--------------+ PERO     Full                                                        +---------+---------------+---------+-----------+----------+--------------+ Poorly visualized bilateral calf veins due to body habitus,    Summary: RIGHT: - There is no evidence of deep vein thrombosis in the lower extremity.  - No cystic structure found in the popliteal fossa.  LEFT: - There is no evidence of deep vein thrombosis in the lower extremity.  - No cystic structure found in the popliteal fossa.  *See table(s) above for measurements and observations. Electronically signed by Curt Jews MD on 08/30/2019 at 5:09:57 PM.    Final     Scheduled Meds: . allopurinol  200 mg Oral Daily  . amiodarone  200 mg Oral Once per day on Mon Tue Wed Thu Fri Sat  . vitamin C  500 mg Oral Daily  . atorvastatin  40 mg Oral Daily  . dexamethasone (DECADRON) injection  6 mg Intravenous Q24H  . diltiazem  180 mg Oral Daily  . fenofibrate  160 mg Oral Daily  . ferrous sulfate  325 mg Oral Daily  . hydrocortisone  25 mg Rectal QHS  .  insulin aspart  0-5 Units Subcutaneous QHS  . insulin aspart  0-9 Units Subcutaneous TID WC  . insulin glargine  25 Units Subcutaneous Daily  . levothyroxine  150 mcg Oral QAC breakfast  . metoprolol tartrate  12.5 mg Oral BID  . zinc  sulfate  220 mg Oral Daily   Continuous Infusions: .  ceFAZolin (ANCEF) IV    . remdesivir 100 mg in NS 100 mL 100 mg (08/31/19 1053)     LOS: 3 days   Alma Friendly, MD [facility] Pager On Amion  If 7PM-7AM, please contact night-coverage 08/31/2019, 1:01 PM

## 2019-08-31 NOTE — Progress Notes (Signed)
Patient transferred to [facility] in stable condition.

## 2019-08-31 NOTE — Progress Notes (Signed)
Patient ID: Michael [name], male   DOB: [birth year], 79 y.o.   MRN: [id number]         [facility]    Date of Admission:  08/29/2019    Day 4 remdesivir and steroids        Day 3 cefazolin  Michael [name] is a 79 year old gentleman with multiple medical problems who was admitted 3 days ago with COVID-19 infection.  He was started on remdesivir and steroids.  He was not febrile upon admission but blood cultures were obtained and are now growing methicillin susceptible coag negative staph in both sets.  1 set grew staph epidermidis and staph hominis.  The other set grew only staph epidermidis.  Antibiotic susceptibilities of the staph epidermidis isolates are pending.  This may help [name] sort out whether or not this is true bacteremia or simple contaminants.  I will continue cefazolin for now.         Michael Bickers, MD [facility] [phone] pager   [phone] cell 08/31/2019, 2:52 PM

## 2019-08-31 NOTE — Progress Notes (Signed)
OT Cancellation Note  Patient Details Name: LYAL HUSTED MRN: [id number] DOB: 03-18-1941   Cancelled Treatment:    Reason Eval/Treat Not Completed: Other (comment).  Not ready per nursing.  Pt to transfer to [facility].  Salik Grewell 08/31/2019, 2:05 PM  Karsten Ro, OTR/L Acute Rehabilitation Services 08/31/2019

## 2019-08-31 NOTE — Progress Notes (Signed)
PHARMACY NOTE:  ANTIMICROBIAL RENAL DOSAGE ADJUSTMENT  Current antimicrobial regimen includes a mismatch between antimicrobial dosage and estimated renal function.  As per policy approved by the Pharmacy & Therapeutics and Medical Executive Committees, the antimicrobial dosage will be adjusted accordingly.  Current antimicrobial dosage:  Ancef 1gm q8  Indication: possible Staph bacteremia vs contaminants, Micro lab request to speciate CoNS cxs if same- consider bacteremia, if different > likely contaminant   Renal Function:   Estimated Creatinine Clearance: 34.7 mL/min (A) (by C-G formula based on SCr of 2.29 mg/dL (H)). []      On intermittent HD, scheduled: []      On CRRT    Antimicrobial dosage has been changed to:  Ancef 2gm q8 with improving renal fx, possible G+ bacteremia  2/1 BCx L antecubital: 2/2 Staph epi & Staph hominis  2/1 BCx R antecubital: 2/2 Staph epi  Thank you for allowing pharmacy to be a part of this patient's care.  Minda Ditto PharmD 08/31/2019 9:32 AM

## 2019-08-31 NOTE — Progress Notes (Signed)
PT Cancellation Note  Patient Details Name: Michael [name] MRN: [id number] DOB: June 14, 1941   Cancelled Treatment:    Reason Eval/Treat Not Completed: Medical issues which prohibited therapy(per RN pt is not tolerating any activity and is being transferred to [facility], RN stated he is not appropriate for PT today. PT to follow at [facility]   Philomena Doheny PT 08/31/2019  Acute Rehabilitation Services Pager [phone] Office [phone]

## 2019-09-01 LAB — PROCALCITONIN: Procalcitonin: 0.64 ng/mL

## 2019-09-01 LAB — GLUCOSE, CAPILLARY
Glucose-Capillary: 295 mg/dL — ABNORMAL HIGH (ref 70–99)
Glucose-Capillary: 188 mg/dL — ABNORMAL HIGH (ref 70–99)
Glucose-Capillary: 256 mg/dL — ABNORMAL HIGH (ref 70–99)
Glucose-Capillary: 259 mg/dL — ABNORMAL HIGH (ref 70–99)
Glucose-Capillary: 257 mg/dL — ABNORMAL HIGH (ref 70–99)

## 2019-09-01 LAB — MAGNESIUM: Magnesium: 2.7 mg/dL — ABNORMAL HIGH (ref 1.7–2.4)

## 2019-09-01 LAB — COMPREHENSIVE METABOLIC PANEL
ALT: 14 U/L (ref 0–44)
AST: 32 U/L (ref 15–41)
Albumin: 2.3 g/dL — ABNORMAL LOW (ref 3.5–5.0)
Alkaline Phosphatase: 76 U/L (ref 38–126)
Anion gap: 8 (ref 5–15)
BUN: 61 mg/dL — ABNORMAL HIGH (ref 8–23)
CO2: 23 mmol/L (ref 22–32)
Calcium: 7.8 mg/dL — ABNORMAL LOW (ref 8.9–10.3)
Chloride: 106 mmol/L (ref 98–111)
Creatinine, Ser: 2.2 mg/dL — ABNORMAL HIGH (ref 0.61–1.24)
GFR calc Af Amer: 33 mL/min — ABNORMAL LOW (ref >60)
GFR calc non Af Amer: 28 mL/min — ABNORMAL LOW (ref >60)
Glucose, Bld: 212 mg/dL — ABNORMAL HIGH (ref 70–99)
Potassium: 5 mmol/L (ref 3.5–5.1)
Sodium: 137 mmol/L (ref 135–145)
Total Bilirubin: 0.5 mg/dL (ref 0.3–1.2)
Total Protein: 6 g/dL — ABNORMAL LOW (ref 6.5–8.1)

## 2019-09-01 LAB — CBC
HCT: 27 % — ABNORMAL LOW (ref 39.0–52.0)
Hemoglobin: 8.4 g/dL — ABNORMAL LOW (ref 13.0–17.0)
MCH: 27 pg (ref 26.0–34.0)
MCHC: 31.1 g/dL (ref 30.0–36.0)
MCV: 86.8 fL (ref 80.0–100.0)
Platelets: 268 10*3/uL (ref 150–400)
RBC: 3.11 MIL/uL — ABNORMAL LOW (ref 4.22–5.81)
RDW: 16.7 % — ABNORMAL HIGH (ref 11.5–15.5)
WBC: 7.9 10*3/uL (ref 4.0–10.5)
nRBC: 0 % (ref 0.0–0.2)

## 2019-09-01 LAB — BRAIN NATRIURETIC PEPTIDE: B Natriuretic Peptide: 654.9 pg/mL — ABNORMAL HIGH (ref 0.0–100.0)

## 2019-09-01 LAB — CULTURE, BLOOD (ROUTINE X 2): Special Requests: ADEQUATE

## 2019-09-01 LAB — FERRITIN: Ferritin: 1533 ng/mL — ABNORMAL HIGH (ref 24–336)

## 2019-09-01 LAB — D-DIMER, QUANTITATIVE: D-Dimer, Quant: 2.75 ug/mL-FEU — ABNORMAL HIGH (ref 0.00–0.50)

## 2019-09-01 LAB — C-REACTIVE PROTEIN: CRP: 14.8 mg/dL — ABNORMAL HIGH (ref <1.0)

## 2019-09-01 LAB — LACTATE DEHYDROGENASE: LDH: 479 U/L — ABNORMAL HIGH (ref 98–192)

## 2019-09-01 MED ORDER — INSULIN ASPART 100 UNIT/ML ~~LOC~~ SOLN
4.0000 [IU] | Freq: Three times a day (TID) | SUBCUTANEOUS | Status: DC
  Administered 2019-09-01 – 2019-09-05 (×12): 4 [IU] via SUBCUTANEOUS

## 2019-09-01 NOTE — Progress Notes (Signed)
Occupational Therapy Evaluation Patient Details Name: Michael [name] MRN: [id number] DOB: [birth year] Today's Date: 09/01/2019    History of Present Illness 79 y.o. male with medical history significant of CKD stage 3b, paroxysmal A. fib, hypertension, obesity, coronary artery disease, chronic diastolic CHF, anemia who presents to the emergency department with complaints of cold-like symptoms, cough, fatigue, poor appetite. Dx of covid PNA.   Clinical Impression   Patient lives with wife at home and is independent at prior level.  He was on 3L O2 at rest with SpO2 92, and increased O2 to 4L for mobility to maintain SpO2 of 90.  Patient required min guard for functional mobility and transfers and used a RW.  Walked 20 ft in room and he became nauseous and was coughing a lot.  Completed grooming while seated and required mod assist with LE dressing.  Patient needed clarification with questions and responded slowly, indicating processing skills were a little slow.  He stated he felt "foggy."  Patient would benefit from OT to improve strength, activity tolerance and cognition for increased independence with ADLs.  Will continue to follow with OT acutely.     Follow Up Recommendations  Home health OT;Supervision - Intermittent    Equipment Recommendations  3 in 1 bedside commode    Recommendations for Other Services       Precautions / Restrictions Precautions Precautions: None Restrictions Weight Bearing Restrictions: No      Mobility Bed Mobility               General bed mobility comments: Up in chair  Transfers Overall transfer level: Needs assistance   Transfers: Sit to/from Stand Sit to Stand: Min guard              Balance Overall balance assessment: Mild deficits observed, not formally tested                                         ADL either performed or assessed with clinical judgement   ADL Overall ADL's : Needs  assistance/impaired Eating/Feeding: Independent;Sitting   Grooming: Wash/dry hands;Wash/dry face;Min guard;Standing   Upper Body Bathing: Set up;Sitting   Lower Body Bathing: Moderate assistance;Sitting/lateral leans   Upper Body Dressing : Set up;Sitting   Lower Body Dressing: Moderate assistance;Sit to/from stand   Toilet Transfer: Min guard;RW   Toileting- Water quality scientist and Hygiene: Minimal assistance;Moderate assistance;Sit to/from stand       Functional mobility during ADLs: Min guard;Rolling walker       Vision Baseline Vision/History: Wears glasses       Perception     Praxis      Pertinent Vitals/Pain Pain Assessment: Faces Faces Pain Scale: Hurts a little bit Pain Location: Bottom sore from hemorrhoids Pain Descriptors / Indicators: Sore Pain Intervention(s): Limited activity within patient's tolerance     Hand Dominance     Extremity/Trunk Assessment Upper Extremity Assessment Upper Extremity Assessment: Generalized weakness           Communication Communication Communication: No difficulties   Cognition Arousal/Alertness: Awake/alert Behavior During Therapy: WFL for tasks assessed/performed Overall Cognitive Status: Impaired/Different from baseline Area of Impairment: Problem solving;Attention                   Current Attention Level: Alternating         Problem Solving: Slow processing General Comments: Patient said he feels like his "  brain is working slower" - foggy.    General Comments  Patient also recieved news of a family member passing during the session, became quite upset understandibly    Exercises Exercises: Other exercises Other Exercises Other Exercises: x5 flutter valve Other Exercises: x5 incentive spirometer   Shoulder Instructions      Home Living Family/patient expects to be discharged to:: Private residence Living Arrangements: Spouse/significant other Available Help at Discharge: Family Type  of Home: House Home Access: Stairs to enter Technical brewer of Steps: 3 Entrance Stairs-Rails: Right Home Layout: One level     Bathroom Shower/Tub: International aid/development worker Accessibility: Yes   Home Equipment: Grab bars - tub/shower          Prior Functioning/Environment Level of Independence: Independent                 OT Problem List: Decreased strength;Decreased activity tolerance;Impaired balance (sitting and/or standing);Decreased cognition;Decreased safety awareness;Cardiopulmonary status limiting activity      OT Treatment/Interventions: Self-care/ADL training;Therapeutic exercise;Energy conservation;Therapeutic activities;Cognitive remediation/compensation;Balance training;Patient/family education    OT Goals(Current goals can be found in the care plan section) Acute Rehab OT Goals Patient Stated Goal: Get home OT Goal Formulation: With patient Time For Goal Achievement: 09/15/19 Potential to Achieve Goals: Good  OT Frequency: Min 3X/week   Barriers to D/C:            Co-evaluation              AM-PAC OT "6 Clicks" Daily Activity     Outcome Measure Help from another person eating meals?: None Help from another person taking care of personal grooming?: A Little Help from another person toileting, which includes using toliet, bedpan, or urinal?: A Little Help from another person bathing (including washing, rinsing, drying)?: A Lot Help from another person to put on and taking off regular upper body clothing?: A Little Help from another person to put on and taking off regular lower body clothing?: A Lot 6 Click Score: 17   End of Session Equipment Utilized During Treatment: Oxygen;Gait belt;Rolling walker Nurse Communication: Mobility status  Activity Tolerance: Patient limited by fatigue Patient left: in chair;with call bell/phone within reach  OT Visit Diagnosis: Unsteadiness on feet (R26.81);Muscle weakness (generalized)  (M62.81);Other symptoms and signs involving cognitive function;Pain                Time: 3151-7616 OT Time Calculation (min): 49 min Charges:  OT General Charges $OT Visit: 1 Visit OT Evaluation $OT Eval Moderate Complexity: 1 Mod OT Treatments $Self Care/Home Management : 23-37 mins  August Luz, OTR/L   Phylliss Bob 09/01/2019, 12:28 PM

## 2019-09-01 NOTE — Plan of Care (Signed)
Pt pleasant and talkative gentleman. Even when he is short of breath he still tries to hold conversation. Currently on 6L HFNC, did well on 4L Remy until he wanted to get out of bed. Had to increase O2 due to desaturation. Condom cath applied. Pt claims he is unable to use urinal and PrimoFit would still leak. Productive cough, encouraged to use IS and FV.  Problem: Education: Goal: Knowledge of risk factors and measures for prevention of condition will improve Outcome: Progressing   Problem: Coping: Goal: Psychosocial and spiritual needs will be supported Outcome: Progressing   Problem: Respiratory: Goal: Will maintain a patent airway Outcome: Progressing Goal: Complications related to the disease process, condition or treatment will be avoided or minimized Outcome: Progressing   Problem: Education: Goal: Knowledge of General Education information will improve Description: Including pain rating scale, medication(s)/side effects and non-pharmacologic comfort measures Outcome: Progressing   Problem: Health Behavior/Discharge Planning: Goal: Ability to manage health-related needs will improve Outcome: Progressing   Problem: Clinical Measurements: Goal: Ability to maintain clinical measurements within normal limits will improve Outcome: Progressing Goal: Will remain free from infection Outcome: Progressing Goal: Diagnostic test results will improve Outcome: Progressing Goal: Respiratory complications will improve Outcome: Progressing Goal: Cardiovascular complication will be avoided Outcome: Progressing   Problem: Activity: Goal: Risk for activity intolerance will decrease Outcome: Progressing   Problem: Nutrition: Goal: Adequate nutrition will be maintained Outcome: Progressing   Problem: Coping: Goal: Level of anxiety will decrease Outcome: Progressing   Problem: Elimination: Goal: Will not experience complications related to bowel motility Outcome: Progressing Goal:  Will not experience complications related to urinary retention Outcome: Progressing   Problem: Pain Managment: Goal: General experience of comfort will improve Outcome: Progressing   Problem: Safety: Goal: Ability to remain free from injury will improve Outcome: Progressing   Problem: Skin Integrity: Goal: Risk for impaired skin integrity will decrease Outcome: Progressing

## 2019-09-01 NOTE — Progress Notes (Signed)
   09/01/19 1126  Family/Significant Other Communication  Family/Significant Other Update Called;Updated  updated wife

## 2019-09-01 NOTE — Progress Notes (Signed)
PROGRESS NOTE    Michael [name]  EZM:[id number] DOB: 1940/09/19 DOA: 09/17/2019 PCP: Cassandria Anger, MD   Brief Narrative:  79 year old with history of CKD stage IIIb, paroxysmal A. fib, HTN, CAD, obesity, diastolic CHF, anemia presents to the ER with complains of URI type of symptoms along with fatigue.  Chest x-ray showed multifocal pneumonia, was COVID-19 positive.  Initially admitted to [facility] due to concerns of GI bleed which was suspected secondary to AVMs, GI was consulted who recommended conservative management for now with endoscopic evaluation in the future.  Showed staphylococcal bacteremia suspicion for contamination versus infection, ID was consulted and initially started on Ancef.  Due to increasing oxygen requirement despite of remdesivir, steroids patient was transferred to [facility] for further management.   Assessment & Plan:   Principal Problem:   COVID-19 virus infection Active Problems:   DM (diabetes mellitus), type 2 with peripheral vascular complications (HCC)   AVM (arteriovenous malformation) of small bowel, acquired with hemorrhage   Iron deficiency anemia due to chronic blood loss   Gout   Persistent atrial fibrillation   Hyperlipidemia   Hypothyroidism   Shortness of breath   Chronic diastolic CHF (congestive heart failure) (HCC)   CKD (chronic kidney disease), stage III   Dyspnea   Positive blood cultures  Acute hypoxic respiratory failure secondary to multifocal COVID-19 pneumonia -Oxygen levels-6 L high flow -Remdesivir-day 5 -Decadrone-day 5 -Routine: Labs have been reviewed including ferritin, LDH, CRP, d-dimer, fibrinogen.  Will need to trend this lab daily. -Vitamin C & Zinc. Prone >16hrs/day.  -procalcitonin-0.64 -Chest x-ray-multifocal pneumonia -Supportive care-antitussive, inhalers, I-S/flutter -CODE STATUS confirmed -Patient on Cosentyx therefore did not receive Actemra  Staphylococcus coagulase-negative  bacteremia, Staph epidermidis -Infectious disease following.  Currently on IV Ancef. -Repeat surveillance cultures-no growth to date  GI bleed, stable History of AVMs -Baseline hemoglobin around 10 -Seen by GI, endoscopic evaluation at a later time but currently proceed with conservative management.  Coronary artery disease status post PCI -Chest pain-free.  On aspirin, beta-blocker and statin  Congestive heart failure with preserved ejection fraction, 65% -Currently appears to be euvolemic.  Daily Lasix p.o. -Supportive care.  History of paroxysmal atrial fibrillation -Currently rate controlled.  On amiodarone, Cardizem and metoprolol -No anticoagulation due to GI bleeds  Essential hypertension -Continue current medications  Hypothyroidism -Synthroid  Diabetes mellitus type 2 -Hemoglobin A1c 5.7 -Insulin sliding scale and Accu-Chek  CKD stage IIIb -Baseline creatinine 2.0.  Today this is 2.2. -BUN elevated, suspect from steroid use -Renal ultrasound negative.  History of psoriasis -On Cosentyx  DVT prophylaxis: SCDs due to GI bleed Code Status: Full code Family Communication:  Called his Wife, No Answer. Left a voicemail. Disposition Plan:   Patient From= home  Patient Anticipated D/C place= Home  Barriers= patient is still short of breath and hypoxic requiring 6 L of oxygen.  Currently undergoing treatment for bacteremia with IV antibiotics and COVID-19 pneumonia.  Unsafe for discharge.    Subjective: Sitting out in the chair, overall feels slightly better than yesterday.  Denies any further bleeding overnight.  Still has cough and exertional shortness of breath and does not feel back to his baseline yet remains on oxygen.  Review of Systems Otherwise negative except as per HPI, including: General: Denies fever, chills, night sweats or unintended weight loss. Resp: Denies hemoptysis Cardiac: Denies chest pain, palpitations, orthopnea, paroxysmal nocturnal  dyspnea. GI: Denies abdominal pain, nausea, vomiting, diarrhea or constipation GU: Denies dysuria, frequency, hesitancy  or incontinence MS: Denies muscle aches, joint pain or swelling Neuro: Denies headache, neurologic deficits (focal weakness, numbness, tingling), abnormal gait Psych: Denies anxiety, depression, SI/HI/AVH Skin: Denies new rashes or lesions ID: Denies sick contacts, exotic exposures, travel  Examination:  General exam: Appears calm and comfortable, 6 L high flow Respiratory system: Bilateral rhonchi Cardiovascular system: S1 & S2 heard, RRR. No JVD, murmurs, rubs, gallops or clicks. No pedal edema. Gastrointestinal system: Abdomen is nondistended, soft and nontender. No organomegaly or masses felt. Normal bowel sounds heard. Central nervous system: Alert and oriented. No focal neurological deficits. Extremities: Symmetric 5 x 5 power. Skin: No rashes, lesions or ulcers Psychiatry: Judgement and insight appear normal. Mood & affect appropriate.     Objective: Vitals:   08/31/19 1921 08/31/19 2344 09/01/19 0340 09/01/19 0729  BP: (!) 152/64 (!) 154/64 (!) 166/68 (!) 142/67  Pulse: 67 60 69 64  Resp: 20 17 20 20   Temp: 98.6 F (37 C) 97.6 F (36.4 C) 98.8 F (37.1 C) 98 F (36.7 C)  TempSrc: Oral Oral Oral Oral  SpO2: 94% 91% 91% 91%  Weight:      Height:        Intake/Output Summary (Last 24 hours) at 09/01/2019 0737 Last data filed at 09/01/2019 0656 Gross per 24 hour  Intake 1143.56 ml  Output 550 ml  Net 593.56 ml   Filed Weights   09/10/2019 1830  Weight: 110.7 kg     Data Reviewed:   CBC: Recent Labs  Lab 08/30/2019 1113 08/29/19 0337 08/30/19 0429 08/31/19 0445 09/01/19 0102  WBC 5.6 5.1 6.4 7.8 7.9  NEUTROABS 5.0 4.4 5.6 7.1  --   HGB 8.6* 7.9* 8.2* 8.1* 8.4*  HCT 27.9* 25.6* 26.5* 25.8* 27.0*  MCV 87.7 88.6 88.0 88.7 86.8  PLT 224 251 243 246 657   Basic Metabolic Panel: Recent Labs  Lab 09/22/2019 1113 08/29/19 0337 08/30/19 0429  08/31/19 0445 09/01/19 0102  NA 138 136 137 138 137  K 4.0 4.4 4.8 4.8 5.0  CL 106 105 105 106 106  CO2 22 21* 23 23 23   GLUCOSE 200* 187* 156* 194* 212*  BUN 48* 55* 56* 59* 61*  CREATININE 2.38* 2.34* 2.54* 2.29* 2.20*  CALCIUM 7.6* 7.5* 7.6* 7.7* 7.8*  MG  --   --   --   --  2.7*   GFR: Estimated Creatinine Clearance: 36.2 mL/min (A) (by C-G formula based on SCr of 2.2 mg/dL (H)). Liver Function Tests: Recent Labs  Lab 09/07/2019 1113 08/29/19 0337 08/30/19 0429 08/31/19 0445 09/01/19 0102  AST 39 31 35 30 32  ALT 23 20 19 15 14   ALKPHOS 61 63 74 69 76  BILITOT 1.0 0.9 0.7 0.6 0.5  PROT 6.4* 5.9* 5.7* 5.8* 6.0*  ALBUMIN 2.8* 2.7* 2.5* 2.4* 2.3*   No results for input(s): LIPASE, AMYLASE in the last 168 hours. No results for input(s): AMMONIA in the last 168 hours. Coagulation Profile: No results for input(s): INR, PROTIME in the last 168 hours. Cardiac Enzymes: No results for input(s): CKTOTAL, CKMB, CKMBINDEX, TROPONINI in the last 168 hours. BNP (last 3 results) No results for input(s): PROBNP in the last 8760 hours. HbA1C: No results for input(s): HGBA1C in the last 72 hours. CBG: Recent Labs  Lab 08/30/19 2131 08/31/19 0719 08/31/19 1142 08/31/19 1637 08/31/19 2145  GLUCAP 220* 166* 168* 249* 265*   Lipid Profile: No results for input(s): CHOL, HDL, LDLCALC, TRIG, CHOLHDL, LDLDIRECT in the last 72 hours.  Thyroid Function Tests: No results for input(s): TSH, T4TOTAL, FREET4, T3FREE, THYROIDAB in the last 72 hours. Anemia Panel: Recent Labs    08/31/19 0445 09/01/19 0102  FERRITIN 1,158* 1,533*   Sepsis Labs: Recent Labs  Lab 08/30/2019 1113 09/01/19 0102  PROCALCITON 0.19 0.64  LATICACIDVEN 1.8  --     Recent Results (from the past 240 hour(s))  Blood Culture (routine x 2)     Status: Abnormal (Preliminary result)   Collection Time: 09/01/2019 11:13 AM   Specimen: BLOOD  Result Value Ref Range Status   Specimen Description   Final    BLOOD  RIGHT ANTECUBITAL Performed at [facility], [place] [address], [place], [place] [address]    Special Requests   Final    BOTTLES DRAWN AEROBIC AND ANAEROBIC Blood Culture adequate volume Performed at [place] [address] [place], [place] [address]    Culture  Setup Time   Final    GRAM POSITIVE COCCI IN BOTH AEROBIC AND ANAEROBIC BOTTLES CRITICAL RESULT CALLED TO, READ BACK BY AND VERIFIED WITH: PHARMD M SWEYNE [id number] AT 83 AM BY CM    Culture (A)  Final    STAPHYLOCOCCUS EPIDERMIDIS SUSCEPTIBILITIES TO FOLLOW Performed at [facility], [place] [address], [place], [place] [address]    Report Status PENDING  Incomplete  Respiratory Panel by RT PCR (Flu A&B, Covid) - Nasopharyngeal Swab     Status: Abnormal   Collection Time: 08/29/2019 12:44 PM   Specimen: Nasopharyngeal Swab  Result Value Ref Range Status   SARS Coronavirus 2 by RT PCR POSITIVE (A) NEGATIVE Final    Comment: RESULT CALLED TO, READ BACK BY AND VERIFIED WITH: C.GRIFFITH AT 1607 ON 09/18/2019 BY N.THOMPSON (NOTE) SARS-CoV-2 target nucleic acids are DETECTED. SARS-CoV-2 RNA is generally detectable in upper respiratory specimens  during the acute phase of infection. Positive results are indicative of the presence of the identified virus, but do not rule out bacterial infection or co-infection with other pathogens not detected by the test. Clinical correlation with patient history and other diagnostic information is necessary to determine patient infection status. The expected result is Negative. Fact Sheet for Patients:  PinkCheek.be Fact Sheet for Healthcare Providers: GravelBags.it This test is not yet approved or cleared by the [place] FDA and  has been authorized for detection and/or diagnosis of SARS-CoV-2 by FDA under an Emergency Use Authorization (EUA).  This EUA will remain in effect (meaning this  test can be  used) for the duration of  the COVID-19 declaration under Section 564(b)(1) of the Act, 21 U.S.C. section 360bbb-3(b)(1), unless the authorization is terminated or revoked sooner.    Influenza A by PCR NEGATIVE NEGATIVE Final   Influenza B by PCR NEGATIVE NEGATIVE Final    Comment: (NOTE) The Xpert Xpress SARS-CoV-2/FLU/RSV assay is intended as an aid in  the diagnosis of influenza from Nasopharyngeal swab specimens and  should not be used as a sole basis for treatment. Nasal washings and  aspirates are unacceptable for Xpert Xpress SARS-CoV-2/FLU/RSV  testing. Fact Sheet for Patients: PinkCheek.be Fact Sheet for Healthcare Providers: GravelBags.it This test is not yet approved or cleared by the [place] FDA and  has been authorized for detection and/or diagnosis of SARS-CoV-2 by  FDA under an Emergency Use Authorization (EUA). This EUA will remain  in effect (meaning this test can be used) for the duration of the  Covid-19 declaration under Section 564(b)(1) of the Act, 21  U.S.C.  section 360bbb-3(b)(1), unless the authorization is  terminated or revoked. Performed at [facility], [place] [address] [place], [place] [address]   Blood Culture (routine x 2)     Status: Abnormal (Preliminary result)   Collection Time: 09/23/2019  5:17 PM   Specimen: BLOOD  Result Value Ref Range Status   Specimen Description BLOOD LEFT ANTECUBITAL  Final   Special Requests   Final    BOTTLES DRAWN AEROBIC AND ANAEROBIC Blood Culture results may not be optimal due to an excessive volume of blood received in culture bottles   Culture  Setup Time   Final    GRAM POSITIVE COCCI IN BOTH AEROBIC AND ANAEROBIC BOTTLES CRITICAL VALUE NOTED.  VALUE IS CONSISTENT WITH PREVIOUSLY REPORTED AND CALLED VALUE.    Culture (A)  Final    STAPHYLOCOCCUS EPIDERMIDIS SUSCEPTIBILITIES TO FOLLOW STAPHYLOCOCCUS HOMINIS THE  SIGNIFICANCE OF ISOLATING THIS ORGANISM FROM A SINGLE SET OF BLOOD CULTURES WHEN MULTIPLE SETS ARE DRAWN IS UNCERTAIN. PLEASE NOTIFY THE MICROBIOLOGY DEPARTMENT WITHIN ONE WEEK IF SPECIATION AND SENSITIVITIES ARE REQUIRED. Performed at [facility], [place] [address] [place], [place] [address]    Report Status PENDING  Incomplete  Blood Culture ID Panel (Reflexed)     Status: Abnormal   Collection Time: 09/21/2019  5:17 PM  Result Value Ref Range Status   Enterococcus species NOT DETECTED NOT DETECTED Final   Listeria monocytogenes NOT DETECTED NOT DETECTED Final   Staphylococcus species DETECTED (A) NOT DETECTED Final    Comment: Methicillin (oxacillin) susceptible coagulase negative staphylococcus. Possible blood culture contaminant (unless isolated from more than one blood culture draw or clinical case suggests pathogenicity). No antibiotic treatment is indicated for blood  culture contaminants. CRITICAL RESULT CALLED TO, READ BACK BY AND VERIFIED WITH: Shelda Jakes PharmD 12:30 08/29/19 ([email or web address])    Staphylococcus aureus (BCID) NOT DETECTED NOT DETECTED Final   Methicillin resistance NOT DETECTED NOT DETECTED Final   Streptococcus species NOT DETECTED NOT DETECTED Final   Streptococcus agalactiae NOT DETECTED NOT DETECTED Final   Streptococcus pneumoniae NOT DETECTED NOT DETECTED Final   Streptococcus pyogenes NOT DETECTED NOT DETECTED Final   Acinetobacter baumannii NOT DETECTED NOT DETECTED Final   Enterobacteriaceae species NOT DETECTED NOT DETECTED Final   Enterobacter cloacae complex NOT DETECTED NOT DETECTED Final   Escherichia coli NOT DETECTED NOT DETECTED Final   Klebsiella oxytoca NOT DETECTED NOT DETECTED Final   Klebsiella pneumoniae NOT DETECTED NOT DETECTED Final   Proteus species NOT DETECTED NOT DETECTED Final   Serratia marcescens NOT DETECTED NOT DETECTED Final   Haemophilus influenzae NOT DETECTED NOT DETECTED Final   Neisseria meningitidis NOT DETECTED NOT DETECTED  Final   Pseudomonas aeruginosa NOT DETECTED NOT DETECTED Final   Candida albicans NOT DETECTED NOT DETECTED Final   Candida glabrata NOT DETECTED NOT DETECTED Final   Candida krusei NOT DETECTED NOT DETECTED Final   Candida parapsilosis NOT DETECTED NOT DETECTED Final   Candida tropicalis NOT DETECTED NOT DETECTED Final    Comment: Performed at [facility], [address] [place], [place] [address]  Expectorated sputum assessment w rflx to resp cult     Status: None   Collection Time: 08/29/19  5:46 PM   Specimen: Sputum  Result Value Ref Range Status   Specimen Description SPU  Final   Special Requests NONE  Final   Sputum evaluation   Final    Sputum specimen not acceptable for testing.  Please recollect.   Performed  at [facility], [place] [address], [place], [place] [address]    Report Status 08/29/2019 FINAL  Final  Culture, blood (routine x 2)     Status: None (Preliminary result)   Collection Time: 08/30/19  2:21 PM   Specimen: BLOOD RIGHT FOREARM  Result Value Ref Range Status   Specimen Description   Final    BLOOD RIGHT FOREARM Performed at [facility] [address], [place], [place] [address]    Special Requests   Final    BOTTLES DRAWN AEROBIC ONLY Blood Culture adequate volume Performed at [facility] [address], [place], [place] [address]    Culture   Final    NO GROWTH < 24 HOURS Performed at [facility] [address] [place], [place] [address]    Report Status PENDING  Incomplete  Culture, blood (routine x 2)     Status: None (Preliminary result)   Collection Time: 08/30/19  2:21 PM   Specimen: BLOOD  Result Value Ref Range Status   Specimen Description   Final    BLOOD RIGHT ANTECUBITAL Performed at [facility] [address], [place], [place] [address]    Special Requests   Final    BOTTLES DRAWN AEROBIC ONLY Blood Culture adequate volume Performed at  [facility] [address] [place], [place] [address]    Culture   Final    NO GROWTH < 24 HOURS Performed at [facility] [address] [place], [place] [address]    Report Status PENDING  Incomplete         Radiology Studies: US RENAL  Result Date: 08/30/2019 CLINICAL DATA:  Chronic renal disease, COVID-19 positivity EXAM: RENAL / URINARY TRACT ULTRASOUND COMPLETE COMPARISON:  03/18/2016 FINDINGS: Right Kidney: Renal measurements: 12.6 x 5.8 x 5.4 cm. = volume: 208 mL. Mild cortical thinning is noted. Normal echogenicity is seen. Left Kidney: Renal measurements: 13.4 x 5.3 x 5.2 cm. = volume: 195 mL. Mild cortical thinning is noted. Normal echogenicity is seen. 1.4 mm cyst is noted within the mid to lower left kidney. Bladder: Appears normal for degree of bladder distention. Other: None. IMPRESSION: Left renal cyst. Mild cortical thinning bilaterally. Electronically Signed   By: Inez Catalina M.D.   On: 08/30/2019 19:09   VAS [name] LOWER EXTREMITY VENOUS (DVT)  Result Date: 08/30/2019  Lower Venous DVTStudy Indications: SOB. Other Indications: Covid+. Risk Factors: Obesity and A fib, DM, HTN, HLD, CAD. Limitations: Body habitus and poor ultrasound/tissue interface. Comparison Study: No prior exam. Performing Technologist: Baldwin Crown ARDMS, RVT  Examination Guidelines: A complete evaluation includes B-mode imaging, spectral Doppler, color Doppler, and power Doppler as needed of all accessible portions of each vessel. Bilateral testing is considered an integral part of a complete examination. Limited examinations for reoccurring indications may be performed as noted. The reflux portion of the exam is performed with the patient in reverse Trendelenburg.  +---------+---------------+---------+-----------+----------+--------------+ RIGHT    CompressibilityPhasicitySpontaneityPropertiesThrombus Aging  +---------+---------------+---------+-----------+----------+--------------+ CFV      Full           Yes      Yes                                 +---------+---------------+---------+-----------+----------+--------------+ SFJ      Full                                                        +---------+---------------+---------+-----------+----------+--------------+  FV Prox  Full                                                        +---------+---------------+---------+-----------+----------+--------------+ FV Mid   Full                                                        +---------+---------------+---------+-----------+----------+--------------+ FV DistalFull                                                        +---------+---------------+---------+-----------+----------+--------------+ PFV      Full                                                        +---------+---------------+---------+-----------+----------+--------------+ POP      Full           Yes      Yes                                 +---------+---------------+---------+-----------+----------+--------------+ PTV      Full                                                        +---------+---------------+---------+-----------+----------+--------------+ PERO     Full                                                        +---------+---------------+---------+-----------+----------+--------------+   +---------+---------------+---------+-----------+----------+--------------+ LEFT     CompressibilityPhasicitySpontaneityPropertiesThrombus Aging +---------+---------------+---------+-----------+----------+--------------+ CFV      Full           Yes      Yes                                 +---------+---------------+---------+-----------+----------+--------------+ SFJ      Full                                                         +---------+---------------+---------+-----------+----------+--------------+ FV Prox  Full                                                        +---------+---------------+---------+-----------+----------+--------------+  FV Mid   Full                                                        +---------+---------------+---------+-----------+----------+--------------+ FV DistalFull                                                        +---------+---------------+---------+-----------+----------+--------------+ PFV      Full                                                        +---------+---------------+---------+-----------+----------+--------------+ POP      Full           Yes      Yes                                 +---------+---------------+---------+-----------+----------+--------------+ PTV      Full                                                        +---------+---------------+---------+-----------+----------+--------------+ PERO     Full                                                        +---------+---------------+---------+-----------+----------+--------------+ Poorly visualized bilateral calf veins due to body habitus,    Summary: RIGHT: - There is no evidence of deep vein thrombosis in the lower extremity.  - No cystic structure found in the popliteal fossa.  LEFT: - There is no evidence of deep vein thrombosis in the lower extremity.  - No cystic structure found in the popliteal fossa.  *See table(s) above for measurements and observations. Electronically signed by Curt Jews MD on 08/30/2019 at 5:09:57 PM.    Final         Scheduled Meds: . allopurinol  200 mg Oral Daily  . amiodarone  200 mg Oral Once per day on Mon Tue Wed Thu Fri Sat  . vitamin C  500 mg Oral Daily  . atorvastatin  40 mg Oral Daily  . dexamethasone (DECADRON) injection  6 mg Intravenous Q24H  . diltiazem  180 mg Oral Daily  . fenofibrate  160 mg Oral Daily  . ferrous  sulfate  325 mg Oral Daily  . furosemide  40 mg Oral Daily  . hydrocortisone  25 mg Rectal QHS  . insulin aspart  0-5 Units Subcutaneous QHS  . insulin aspart  0-9 Units Subcutaneous TID WC  . insulin glargine  25 Units Subcutaneous Daily  . Ipratropium-Albuterol  1 puff Inhalation Q6H  . levothyroxine  150 mcg Oral QAC breakfast  . metoprolol  tartrate  12.5 mg Oral BID  . zinc sulfate  220 mg Oral Daily   Continuous Infusions: .  ceFAZolin (ANCEF) IV 2 g (09/01/19 0625)  . remdesivir 100 mg in NS 100 mL 100 mg (08/31/19 1053)     LOS: 4 days   Time spent= 35 mins    Toshio Slusher Arsenio Loader, MD [facility]  If 7PM-7AM, please contact night-coverage  09/01/2019, 7:37 AM

## 2019-09-01 NOTE — Evaluation (Signed)
Physical Therapy Evaluation Patient Details Name: Michael [name] MRN: [id number] DOB: Feb 07, 1941 Today's Date: 09/01/2019   History of Present Illness  79 y.o. male with medical history significant of CKD stage 3b, paroxysmal A. fib, hypertension, obesity, coronary artery disease, chronic diastolic CHF, anemia, gout, dyspnea, DM, AVM who presents to the emergency department with complaints of cold-like symptoms, cough, fatigue, poor appetite. In the ED, Chest x-ray done in the emergency department showed multifocal pneumonia. Covid testing confirms COVID pna  Clinical Impression   Pt admitted with above diagnosis. PTA states was living home and was independent, during assessment he insists he is not able to use walker but is noted to be furniture cruising, and even stated " I just need something to hold on to". Pt currently with functional limitations due to the deficits listed below (see PT Problem List). Pt is quite self limiting needing increased encouragement to complete most tasks. He c/o pain in back with laying in bed and therapist attempted to educate on log rolling but was not able to effectively follow cues, will need reinforcement of this. He was able to stand from bed and also commode with set up and stand by assist, ambulate din room 1 x30 with no AD (as he insisted) but was noted to be grasping at walls and furniture for support, ambulated 1 x30 with RW but needed stern encouragement to use walker and appropriately. Pt was initially on 6L/min via HFNC and was noted to desat to low 80s with supine to sit, for ambulation was increased initially to 8L/min and still desat to 70s and was increased again to 10L/min, he was able to ambulate and use rest room including clean up with min desat noted 82%. Pt educated on pursed lip breathing yet again needs tactile cues to complete, he states he's not much of a mouth breather. Pt will benefit from skilled PT to increase their independence and safety with  mobility to allow discharge to the venue listed below.       Follow Up Recommendations Home health PT    Equipment Recommendations  Rolling walker with 5" wheels;3in1 (PT)    Recommendations for Other Services       Precautions / Restrictions Precautions Precautions: Fall Restrictions Weight Bearing Restrictions: No      Mobility  Bed Mobility Overal bed mobility: Needs Assistance Bed Mobility: Supine to Sit;Sit to Supine     Supine to sit: Supervision Sit to supine: Supervision   General bed mobility comments: attempted to teach pt to log roll as he was c/o pain in back but does not follow cues for sequencing well at all   Transfers Overall transfer level: Needs assistance Equipment used: Rolling walker (2 wheeled);None(grab bars in rest room) Transfers: Sit to/from Stand Sit to Stand: Supervision            Ambulation/Gait Ambulation/Gait assistance: Min guard Gait Distance (Feet): 30 Feet Assistive device: Rolling walker (2 wheeled) Gait Pattern/deviations: Step-to pattern;Wide base of support;Shuffle Gait velocity: dec      Stairs            Wheelchair Mobility    Modified Rankin (Stroke Patients Only)       Balance Overall balance assessment: Needs assistance Sitting-balance support: Feet supported Sitting balance-Leahy Scale: Fair     Standing balance support: During functional activity;Single extremity supported;Bilateral upper extremity supported Standing balance-Leahy Scale: Fair  Pertinent Vitals/Pain Pain Assessment: 0-10 Pain Score: 6  Pain Location: back  Pain Descriptors / Indicators: Discomfort;Grimacing;Guarding Pain Intervention(s): Limited activity within patient's tolerance;Monitored during session    Home Living Family/patient expects to be discharged to:: Private residence Living Arrangements: Spouse/significant other Available Help at Discharge: Family Type of Home:  House Home Access: Stairs to enter Entrance Stairs-Rails: Right Entrance Stairs-Number of Steps: 3 Home Layout: One level Home Equipment: Grab bars - tub/shower      Prior Function Level of Independence: Independent               Hand Dominance        Extremity/Trunk Assessment   Upper Extremity Assessment Upper Extremity Assessment: Generalized weakness    Lower Extremity Assessment Lower Extremity Assessment: Generalized weakness       Communication   Communication: No difficulties  Cognition Arousal/Alertness: Awake/alert Behavior During Therapy: WFL for tasks assessed/performed Overall Cognitive Status: Impaired/Different from baseline                                        General Comments      Exercises Other Exercises Other Exercises: flutter valve x 5 Other Exercises: incentive spirometer x 5   Assessment/Plan    PT Assessment Patient needs continued PT services  PT Problem List Decreased strength;Decreased activity tolerance;Decreased balance;Decreased mobility;Decreased coordination;Decreased cognition;Decreased knowledge of use of DME;Decreased safety awareness       PT Treatment Interventions Gait training;DME instruction;Functional mobility training;Therapeutic activities;Therapeutic exercise;Balance training;Stair training;Neuromuscular re-education;Patient/family education    PT Goals (Current goals can be found in the Care Plan section)  Acute Rehab PT Goals Patient Stated Goal: get better PT Goal Formulation: With patient Time For Goal Achievement: 09/15/19 Potential to Achieve Goals: Fair    Frequency Min 3X/week   Barriers to discharge        Co-evaluation               AM-PAC PT "6 Clicks" Mobility  Outcome Measure Help needed turning from your back to your side while in a flat bed without using bedrails?: None Help needed moving from lying on your back to sitting on the side of a flat bed without  using bedrails?: None Help needed moving to and from a bed to a chair (including a wheelchair)?: A Little Help needed standing up from a chair using your arms (e.g., wheelchair or bedside chair)?: A Little Help needed to walk in hospital room?: A Little Help needed climbing 3-5 steps with a railing? : A Lot 6 Click Score: 19    End of Session Equipment Utilized During Treatment: Oxygen Activity Tolerance: Patient limited by fatigue;Patient limited by lethargy;Patient limited by pain;Treatment limited secondary to medical complications (Comment) Patient left: in bed;with call bell/phone within reach Nurse Communication: Mobility status PT Visit Diagnosis: Unsteadiness on feet (R26.81);Other abnormalities of gait and mobility (R26.89)    Time: 9678-9381 PT Time Calculation (min) (ACUTE ONLY): 42 min   Charges:   PT Evaluation $PT Eval Moderate Complexity: 1 Mod PT Treatments $Gait Training: 8-22 mins $Therapeutic Activity: 8-22 mins        Horald Chestnut, PT   Delford Field 09/01/2019, 4:34 PM

## 2019-09-01 NOTE — Progress Notes (Signed)
Results for COVEY, BALLER (MRN [id number]) as of 09/01/2019 13:30  Ref. Range 08/31/2019 16:37 08/31/2019 19:55 08/31/2019 21:45 09/01/2019 07:27 09/01/2019 11:32  Glucose-Capillary Latest Ref Range: 70 - 99 mg/dL 249 (H) 295 (H) 265 (H) 188 (H) 256 (H)  Noted that postprandial blood sugars continue to be greater than 200 mg/dl.   Recommend adding Novolog 4 units TID with meals if he eats at least 50% of meal and his blood sugars continue to be elevated. May need to titrate other insulin dosages as needed.   Harvel Ricks RN BSN CDE Diabetes Coordinator Pager: [phone]  8am-5pm

## 2019-09-01 NOTE — Progress Notes (Signed)
Patient ID: Michael [name], male   DOB: 24-Jan-1941, 79 y.o.   MRN: [id number]          [facility]    Date of Admission:  09/03/2019    Day 5 remdesivir and steroids        Day 4 cefazolin  [name] is a 79 year old gentleman with multiple medical problems who was admitted 3 days ago with COVID-19 infection.  He was started on remdesivir and steroids.  He was not febrile upon admission but blood cultures were obtained and are now growing methicillin susceptible staph epidermidis.  1 set also grew staph hominis.  I called our lab earlier this weekend and asked that they perform an report antibiotic susceptibilities on both staph epidermidis isolates.  Unfortunately they failed to do that and are just now setting up susceptibility testing on the second isolate.  I would simply continue cefazolin for 3 more days treating for the possibility of coag negative staph bacteremia.  Repeat blood cultures are negative.  Plan: 1. Continue cefazolin for 3 more days.  I have placed a stop order. 2. Please call if we can be of further assistance         Michel Bickers, MD [facility] [place] [phone] pager   [phone] cell 09/01/2019, 10:01 AM

## 2019-09-02 LAB — COMPREHENSIVE METABOLIC PANEL
ALT: 13 U/L (ref 0–44)
AST: 36 U/L (ref 15–41)
Albumin: 2.4 g/dL — ABNORMAL LOW (ref 3.5–5.0)
Alkaline Phosphatase: 84 U/L (ref 38–126)
Anion gap: 8 (ref 5–15)
BUN: 71 mg/dL — ABNORMAL HIGH (ref 8–23)
CO2: 25 mmol/L (ref 22–32)
Calcium: 8 mg/dL — ABNORMAL LOW (ref 8.9–10.3)
Chloride: 105 mmol/L (ref 98–111)
Creatinine, Ser: 2.03 mg/dL — ABNORMAL HIGH (ref 0.61–1.24)
GFR calc Af Amer: 36 mL/min — ABNORMAL LOW (ref >60)
GFR calc non Af Amer: 31 mL/min — ABNORMAL LOW (ref >60)
Glucose, Bld: 195 mg/dL — ABNORMAL HIGH (ref 70–99)
Potassium: 5.2 mmol/L — ABNORMAL HIGH (ref 3.5–5.1)
Sodium: 138 mmol/L (ref 135–145)
Total Bilirubin: 0.6 mg/dL (ref 0.3–1.2)
Total Protein: 6.2 g/dL — ABNORMAL LOW (ref 6.5–8.1)

## 2019-09-02 LAB — CBC
HCT: 29.3 % — ABNORMAL LOW (ref 39.0–52.0)
Hemoglobin: 8.8 g/dL — ABNORMAL LOW (ref 13.0–17.0)
MCH: 26.4 pg (ref 26.0–34.0)
MCHC: 30 g/dL (ref 30.0–36.0)
MCV: 88 fL (ref 80.0–100.0)
Platelets: 239 10*3/uL (ref 150–400)
RBC: 3.33 MIL/uL — ABNORMAL LOW (ref 4.22–5.81)
RDW: 17 % — ABNORMAL HIGH (ref 11.5–15.5)
WBC: 9.7 10*3/uL (ref 4.0–10.5)
nRBC: 0 % (ref 0.0–0.2)

## 2019-09-02 LAB — GLUCOSE, CAPILLARY
Glucose-Capillary: 155 mg/dL — ABNORMAL HIGH (ref 70–99)
Glucose-Capillary: 154 mg/dL — ABNORMAL HIGH (ref 70–99)
Glucose-Capillary: 272 mg/dL — ABNORMAL HIGH (ref 70–99)
Glucose-Capillary: 247 mg/dL — ABNORMAL HIGH (ref 70–99)

## 2019-09-02 LAB — LACTATE DEHYDROGENASE: LDH: 588 U/L — ABNORMAL HIGH (ref 98–192)

## 2019-09-02 LAB — C-REACTIVE PROTEIN: CRP: 10.2 mg/dL — ABNORMAL HIGH (ref <1.0)

## 2019-09-02 LAB — MAGNESIUM: Magnesium: 2.7 mg/dL — ABNORMAL HIGH (ref 1.7–2.4)

## 2019-09-02 LAB — D-DIMER, QUANTITATIVE: D-Dimer, Quant: 2.01 ug/mL-FEU — ABNORMAL HIGH (ref 0.00–0.50)

## 2019-09-02 LAB — FERRITIN: Ferritin: 1676 ng/mL — ABNORMAL HIGH (ref 24–336)

## 2019-09-02 MED ORDER — HYDRALAZINE HCL 25 MG PO TABS
50.0000 mg | ORAL_TABLET | Freq: Three times a day (TID) | ORAL | Status: DC
  Administered 2019-09-02 – 2019-09-08 (×21): 50 mg via ORAL
  Filled 2019-09-02 (×20): qty 2

## 2019-09-02 MED ORDER — GABAPENTIN 300 MG PO CAPS
300.0000 mg | ORAL_CAPSULE | Freq: Three times a day (TID) | ORAL | Status: DC
  Administered 2019-09-02 – 2019-09-06 (×14): 300 mg via ORAL
  Filled 2019-09-02 (×14): qty 1

## 2019-09-02 MED ORDER — BUTALBITAL-APAP-CAFFEINE 50-325-40 MG PO TABS
1.0000 | ORAL_TABLET | Freq: Four times a day (QID) | ORAL | Status: DC | PRN
  Administered 2019-09-02 – 2019-09-07 (×2): 1 via ORAL
  Filled 2019-09-02 (×2): qty 1

## 2019-09-02 MED ORDER — LISINOPRIL 10 MG PO TABS
5.0000 mg | ORAL_TABLET | Freq: Every day | ORAL | Status: DC
  Administered 2019-09-02 – 2019-09-05 (×4): 5 mg via ORAL
  Filled 2019-09-02 (×4): qty 1

## 2019-09-02 MED ORDER — MAGIC MOUTHWASH W/LIDOCAINE
5.0000 mL | Freq: Four times a day (QID) | ORAL | Status: DC | PRN
  Administered 2019-09-02 – 2019-09-08 (×2): 5 mL via ORAL
  Filled 2019-09-02 (×3): qty 5

## 2019-09-02 NOTE — Progress Notes (Signed)
PROGRESS NOTE    Michael [name]  SVX:[id number] DOB: [birth year] DOA: 09/24/2019 PCP: Cassandria Anger, MD   Brief Narrative:  79 year old with history of CKD stage IIIb, paroxysmal A. fib, HTN, CAD, obesity, diastolic CHF, anemia presents to the ER with complains of URI type of symptoms along with fatigue.  Chest x-ray showed multifocal pneumonia, was COVID-19 positive.  Initially admitted to [facility] long hospital due to concerns of GI bleed which was suspected secondary to AVMs, GI was consulted who recommended conservative management for now with endoscopic evaluation in the future.  Showed staphylococcal bacteremia suspicion for contamination versus infection, ID was consulted and initially started on Ancef.  Due to increasing oxygen requirement despite of remdesivir, steroids patient was transferred to [facility] for further management.   Assessment & Plan:   Principal Problem:   COVID-19 virus infection Active Problems:   DM (diabetes mellitus), type 2 with peripheral vascular complications (HCC)   AVM (arteriovenous malformation) of small bowel, acquired with hemorrhage   Iron deficiency anemia due to chronic blood loss   Gout   Persistent atrial fibrillation   Hyperlipidemia   Hypothyroidism   Shortness of breath   Chronic diastolic CHF (congestive heart failure) (HCC)   CKD (chronic kidney disease), stage III   Dyspnea   Positive blood cultures  Acute hypoxic respiratory failure secondary to multifocal COVID-19 pneumonia; minimal improvement.  -Oxygen levels-.  On 8 L high flow nasal cannula.  Minimal improvement. -Remdesivir-completed on 2/5 -Decadrone-day 5 -Routine: Labs have been reviewed including ferritin, LDH, CRP, d-dimer, fibrinogen.  Will need to trend this lab daily. -Vitamin C & Zinc. Prone >16hrs/day.  -procalcitonin-0.64 -Chest x-ray-multifocal pneumonia -Supportive care-antitussive, inhalers, I-S/flutter -CODE STATUS confirmed -Patient on  Cosentyx therefore did not receive Actemra  Staphylococcus coagulase-negative bacteremia, Staph epidermidis -Infectious disease following.  Currently on IV Ancef, 2 more days per ID. -Repeat surveillance cultures-no growth to date  GI bleed, remains stable History of AVMs -Baseline hemoglobin around 10 -Seen by GI, endoscopic evaluation at a later time but currently proceed with conservative management.  Coronary artery disease status post PCI -Chest pain-free.  On aspirin, beta-blocker and statin  Congestive heart failure with preserved ejection fraction, 65% -Currently appears to be euvolemic.  Daily 40 mg p.o. Lasix. -Supportive care.  History of paroxysmal atrial fibrillation -Currently rate controlled.  On amiodarone, Cardizem and metoprolol -No anticoagulation due to GI bleeds  Essential hypertension, uncontrolled -Continue current medications.  Restart hydralazine, lisinopril  Hypothyroidism -Synthroid  Diabetes mellitus type 2, controlled Peripheral neuropathy secondary to DM2 -Hemoglobin A1c 5.7 -Insulin sliding scale and Accu-Chek -Resume gabapentin  CKD stage IIIb -Baseline creatinine 2.0.  Creatinine improved-2.03 -BUN elevated, suspect from steroid use -Renal ultrasound negative.  History of psoriasis -On Cosentyx  Soreness in his mouth-Magic mouthwash ordered  PT recommends home health, rolling walker and 3 in 1.  This has been ordered.  DVT prophylaxis: SCDs Code Status: Full code Family Communication: Wife did not answer again, left her voicemail. Disposition Plan:   Patient From= home  Patient Anticipated D/C place= Home with home health  Barriers= still remains hypoxic and dyspneic with minimal exertion on 8 L high flow nasal cannula, unsafe for discharge.  Maintain hospital stay.   Subjective: Reports some dyspnea on exertion but overall feels okay.  Poor appetite due to reports of soreness in his mouth.  Review of Systems Otherwise  negative except as per HPI, including: General = no fevers, chills, dizziness,  fatigue HEENT/EYES =  negative for loss of vision, double vision, blurred vision,  sore throa Cardiovascular= negative for chest pain, palpitation Respiratory/lungs= negative for wheezing; hemoptysis,  Gastrointestinal= negative for nausea, vomiting, abdominal pain Genitourinary= negative for Dysuria MSK = Negative for arthralgia, myalgias Neurology= Negative for headache, numbness, tingling  Psychiatry= Negative for suicidal and homocidal ideation Skin= Negative for Rash   Examination:  Constitutional: Not in acute distress, 8 L high flow Respiratory: Bilateral rhonchi Cardiovascular: Normal sinus rhythm, no rubs Abdomen: Nontender nondistended good bowel sounds Musculoskeletal: No edema noted Skin: No rashes seen Neurologic: CN 2-12 grossly intact.  And nonfocal Psychiatric: Normal judgment and insight. Alert and oriented x 3. Normal mood.      Objective: Vitals:   09/02/19 0000 09/02/19 0404 09/02/19 0646 09/02/19 0715  BP: (!) 165/57 (!) 169/67  (!) 134/45  Pulse: 67 66  69  Resp: 20 (!) 22  (!) 21  Temp: 97.6 F (36.4 C) 98.2 F (36.8 C)    TempSrc: Axillary Oral    SpO2: 91% 90% 94% 90%  Weight:      Height:        Intake/Output Summary (Last 24 hours) at 09/02/2019 0953 Last data filed at 09/02/2019 0543 Gross per 24 hour  Intake 920 ml  Output 1850 ml  Net -930 ml   Filed Weights   09/12/2019 1830  Weight: 110.7 kg     Data Reviewed:   CBC: Recent Labs  Lab 09/18/2019 1113 09/24/2019 1113 08/29/19 0337 08/30/19 0429 08/31/19 0445 09/01/19 0102 09/02/19 0047  WBC 5.6   < > 5.1 6.4 7.8 7.9 9.7  NEUTROABS 5.0  --  4.4 5.6 7.1  --   --   HGB 8.6*   < > 7.9* 8.2* 8.1* 8.4* 8.8*  HCT 27.9*   < > 25.6* 26.5* 25.8* 27.0* 29.3*  MCV 87.7   < > 88.6 88.0 88.7 86.8 88.0  PLT 224   < > 251 243 246 268 239   < > = values in this interval not displayed.   Basic Metabolic  Panel: Recent Labs  Lab 08/29/19 0337 08/30/19 0429 08/31/19 0445 09/01/19 0102 09/02/19 0047  NA 136 137 138 137 138  K 4.4 4.8 4.8 5.0 5.2*  CL 105 105 106 106 105  CO2 21* 23 23 23 25   GLUCOSE 187* 156* 194* 212* 195*  BUN 55* 56* 59* 61* 71*  CREATININE 2.34* 2.54* 2.29* 2.20* 2.03*  CALCIUM 7.5* 7.6* 7.7* 7.8* 8.0*  MG  --   --   --  2.7* 2.7*   GFR: Estimated Creatinine Clearance: 39.2 mL/min (A) (by C-G formula based on SCr of 2.03 mg/dL (H)). Liver Function Tests: Recent Labs  Lab 08/29/19 0337 08/30/19 0429 08/31/19 0445 09/01/19 0102 09/02/19 0047  AST 31 35 30 32 36  ALT 20 19 15 14 13   ALKPHOS 63 74 69 76 84  BILITOT 0.9 0.7 0.6 0.5 0.6  PROT 5.9* 5.7* 5.8* 6.0* 6.2*  ALBUMIN 2.7* 2.5* 2.4* 2.3* 2.4*   No results for input(s): LIPASE, AMYLASE in the last 168 hours. No results for input(s): AMMONIA in the last 168 hours. Coagulation Profile: No results for input(s): INR, PROTIME in the last 168 hours. Cardiac Enzymes: No results for input(s): CKTOTAL, CKMB, CKMBINDEX, TROPONINI in the last 168 hours. BNP (last 3 results) No results for input(s): PROBNP in the last 8760 hours. HbA1C: No results for input(s): HGBA1C in the last 72 hours. CBG: Recent Labs  Lab 09/01/19 0727 09/01/19  1132 09/01/19 1648 09/01/19 2057 09/02/19 0714  GLUCAP 188* 256* 259* 257* 155*   Lipid Profile: No results for input(s): CHOL, HDL, LDLCALC, TRIG, CHOLHDL, LDLDIRECT in the last 72 hours. Thyroid Function Tests: No results for input(s): TSH, T4TOTAL, FREET4, T3FREE, THYROIDAB in the last 72 hours. Anemia Panel: Recent Labs    09/01/19 0102 09/02/19 0047  FERRITIN 1,533* 1,676*   Sepsis Labs: Recent Labs  Lab 09/16/2019 1113 09/01/19 0102  PROCALCITON 0.19 0.64  LATICACIDVEN 1.8  --     Recent Results (from the past 240 hour(s))  Blood Culture (routine x 2)     Status: Abnormal   Collection Time: 09/08/2019 11:13 AM   Specimen: BLOOD  Result Value Ref  Range Status   Specimen Description   Final    BLOOD RIGHT ANTECUBITAL Performed at [facility] [address] [place], [place] [address]    Special Requests   Final    BOTTLES DRAWN AEROBIC AND ANAEROBIC Blood Culture adequate volume Performed at [facility] [address] [place], [place] [address]    Culture  Setup Time   Final    GRAM POSITIVE COCCI IN BOTH AEROBIC AND ANAEROBIC BOTTLES CRITICAL RESULT CALLED TO, READ BACK BY AND VERIFIED WITH: PHARMD M SWEYNE [id number] AT 43 AM BY CM Performed at [facility], [place] [address], [place], [place] [address]    Culture STAPHYLOCOCCUS EPIDERMIDIS (A)  Final   Report Status 09/01/2019 FINAL  Final   Organism ID, Bacteria STAPHYLOCOCCUS EPIDERMIDIS  Final      Susceptibility   Staphylococcus epidermidis - MIC*    CIPROFLOXACIN <=0.5 SENSITIVE Sensitive     ERYTHROMYCIN <=0.25 SENSITIVE Sensitive     GENTAMICIN <=0.5 SENSITIVE Sensitive     OXACILLIN <=0.25 SENSITIVE Sensitive     TETRACYCLINE <=1 SENSITIVE Sensitive     VANCOMYCIN 1 SENSITIVE Sensitive     TRIMETH/SULFA <=10 SENSITIVE Sensitive     CLINDAMYCIN <=0.25 SENSITIVE Sensitive     RIFAMPIN <=0.5 SENSITIVE Sensitive     Inducible Clindamycin NEGATIVE Sensitive     * STAPHYLOCOCCUS EPIDERMIDIS  Respiratory Panel by RT PCR (Flu A&B, Covid) - Nasopharyngeal Swab     Status: Abnormal   Collection Time: 09/10/2019 12:44 PM   Specimen: Nasopharyngeal Swab  Result Value Ref Range Status   SARS Coronavirus 2 by RT PCR POSITIVE (A) NEGATIVE Final    Comment: RESULT CALLED TO, READ BACK BY AND VERIFIED WITH: C.GRIFFITH AT 1607 ON 09/01/2019 BY N.THOMPSON (NOTE) SARS-CoV-2 target nucleic acids are DETECTED. SARS-CoV-2 RNA is generally detectable in upper respiratory specimens  during the acute phase of infection. Positive results are indicative of the presence of the identified virus, but do not rule out bacterial infection or  co-infection with other pathogens not detected by the test. Clinical correlation with patient history and other diagnostic information is necessary to determine patient infection status. The expected result is Negative. Fact Sheet for Patients:  PinkCheek.be Fact Sheet for Healthcare Providers: GravelBags.it This test is not yet approved or cleared by the [place] FDA and  has been authorized for detection and/or diagnosis of SARS-CoV-2 by FDA under an Emergency Use Authorization (EUA).  This EUA will remain in effect (meaning this test can be  used) for the duration of  the COVID-19 declaration under Section 564(b)(1) of the Act, 21 U.S.C. section 360bbb-3(b)(1), unless the authorization is terminated or revoked sooner.    Influenza A by PCR NEGATIVE NEGATIVE Final  Influenza B by PCR NEGATIVE NEGATIVE Final    Comment: (NOTE) The Xpert Xpress SARS-CoV-2/FLU/RSV assay is intended as an aid in  the diagnosis of influenza from Nasopharyngeal swab specimens and  should not be used as a sole basis for treatment. Nasal washings and  aspirates are unacceptable for Xpert Xpress SARS-CoV-2/FLU/RSV  testing. Fact Sheet for Patients: PinkCheek.be Fact Sheet for Healthcare Providers: GravelBags.it This test is not yet approved or cleared by the [place] FDA and  has been authorized for detection and/or diagnosis of SARS-CoV-2 by  FDA under an Emergency Use Authorization (EUA). This EUA will remain  in effect (meaning this test can be used) for the duration of the  Covid-19 declaration under Section 564(b)(1) of the Act, 21  U.S.C. section 360bbb-3(b)(1), unless the authorization is  terminated or revoked. Performed at [facility], [place] [address], [place], [place] [address]   Blood Culture (routine x 2)     Status: Abnormal   Collection Time:  09/15/2019  5:17 PM   Specimen: BLOOD  Result Value Ref Range Status   Specimen Description BLOOD LEFT ANTECUBITAL  Final   Special Requests   Final    BOTTLES DRAWN AEROBIC AND ANAEROBIC Blood Culture results may not be optimal due to an excessive volume of blood received in culture bottles   Culture  Setup Time   Final    GRAM POSITIVE COCCI IN BOTH AEROBIC AND ANAEROBIC BOTTLES CRITICAL VALUE NOTED.  VALUE IS CONSISTENT WITH PREVIOUSLY REPORTED AND CALLED VALUE.    Culture (A)  Final    STAPHYLOCOCCUS EPIDERMIDIS SUSCEPTIBILITIES PERFORMED ON PREVIOUS CULTURE WITHIN THE LAST 5 DAYS. STAPHYLOCOCCUS HOMINIS THE SIGNIFICANCE OF ISOLATING THIS ORGANISM FROM A SINGLE SET OF BLOOD CULTURES WHEN MULTIPLE SETS ARE DRAWN IS UNCERTAIN. PLEASE NOTIFY THE MICROBIOLOGY DEPARTMENT WITHIN ONE WEEK IF SPECIATION AND SENSITIVITIES ARE REQUIRED. Performed at [facility], [place] [address] [place], [place] [address]    Report Status 09/01/2019 FINAL  Final  Blood Culture ID Panel (Reflexed)     Status: Abnormal   Collection Time: 09/16/2019  5:17 PM  Result Value Ref Range Status   Enterococcus species NOT DETECTED NOT DETECTED Final   Listeria monocytogenes NOT DETECTED NOT DETECTED Final   Staphylococcus species DETECTED (A) NOT DETECTED Final    Comment: Methicillin (oxacillin) susceptible coagulase negative staphylococcus. Possible blood culture contaminant (unless isolated from more than one blood culture draw or clinical case suggests pathogenicity). No antibiotic treatment is indicated for blood  culture contaminants. CRITICAL RESULT CALLED TO, READ BACK BY AND VERIFIED WITH: Shelda Jakes PharmD 12:30 08/29/19 ([email or web address])    Staphylococcus aureus (BCID) NOT DETECTED NOT DETECTED Final   Methicillin resistance NOT DETECTED NOT DETECTED Final   Streptococcus species NOT DETECTED NOT DETECTED Final   Streptococcus agalactiae NOT DETECTED NOT DETECTED Final   Streptococcus pneumoniae NOT DETECTED NOT  DETECTED Final   Streptococcus pyogenes NOT DETECTED NOT DETECTED Final   Acinetobacter baumannii NOT DETECTED NOT DETECTED Final   Enterobacteriaceae species NOT DETECTED NOT DETECTED Final   Enterobacter cloacae complex NOT DETECTED NOT DETECTED Final   Escherichia coli NOT DETECTED NOT DETECTED Final   Klebsiella oxytoca NOT DETECTED NOT DETECTED Final   Klebsiella pneumoniae NOT DETECTED NOT DETECTED Final   Proteus species NOT DETECTED NOT DETECTED Final   Serratia marcescens NOT DETECTED NOT DETECTED Final   Haemophilus influenzae NOT DETECTED NOT DETECTED Final   Neisseria meningitidis NOT DETECTED NOT DETECTED Final   Pseudomonas  aeruginosa NOT DETECTED NOT DETECTED Final   Candida albicans NOT DETECTED NOT DETECTED Final   Candida glabrata NOT DETECTED NOT DETECTED Final   Candida krusei NOT DETECTED NOT DETECTED Final   Candida parapsilosis NOT DETECTED NOT DETECTED Final   Candida tropicalis NOT DETECTED NOT DETECTED Final    Comment: Performed at [facility], [place] [address], [place], [place] [address]  Expectorated sputum assessment w rflx to resp cult     Status: None   Collection Time: 08/29/19  5:46 PM   Specimen: Sputum  Result Value Ref Range Status   Specimen Description SPU  Final   Special Requests NONE  Final   Sputum evaluation   Final    Sputum specimen not acceptable for testing.  Please recollect.   Performed at [facility], [place] [address] [place], [place] [address]    Report Status 08/29/2019 FINAL  Final  Culture, blood (routine x 2)     Status: None (Preliminary result)   Collection Time: 08/30/19  2:21 PM   Specimen: BLOOD RIGHT FOREARM  Result Value Ref Range Status   Specimen Description   Final    BLOOD RIGHT FOREARM Performed at [facility] [address] [place], [place] [address]    Special Requests   Final    BOTTLES DRAWN AEROBIC ONLY Blood Culture adequate volume Performed at [facility] [address], [place], [place] [address]    Culture   Final    NO GROWTH 3 DAYS Performed at [facility], [place] [address], [place], [place] [address]    Report Status PENDING  Incomplete  Culture, blood (routine x 2)     Status: None (Preliminary result)   Collection Time: 08/30/19  2:21 PM   Specimen: BLOOD  Result Value Ref Range Status   Specimen Description   Final    BLOOD RIGHT ANTECUBITAL Performed at [facility] [address] [place], [place] [address]    Special Requests   Final    BOTTLES DRAWN AEROBIC ONLY Blood Culture adequate volume Performed at [facility] [address] [place], [place] [address]    Culture   Final    NO GROWTH 3 DAYS Performed at [facility], [place] [address], [place],  [address]    Report Status PENDING  Incomplete         Radiology Studies: No results found.      Scheduled Meds: . allopurinol  200 mg Oral Daily  . amiodarone  200 mg Oral Once per day on Mon Tue Wed Thu Fri Sat  . vitamin C  500 mg Oral Daily  . atorvastatin  40 mg Oral Daily  . dexamethasone (DECADRON) injection  6 mg Intravenous Q24H  . diltiazem  180 mg Oral Daily  . fenofibrate  160 mg Oral Daily  . ferrous sulfate  325 mg Oral Daily  . furosemide  40 mg Oral Daily  . gabapentin  300 mg Oral TID  . hydrALAZINE  50 mg Oral TID  . hydrocortisone  25 mg Rectal QHS  . insulin aspart  0-5 Units Subcutaneous QHS  . insulin aspart  0-9 Units Subcutaneous TID WC  . insulin aspart  4 Units Subcutaneous TID WC  . insulin glargine  25 Units Subcutaneous Daily  . Ipratropium-Albuterol  1 puff Inhalation Q6H  . levothyroxine  150 mcg Oral QAC breakfast  . lisinopril  5 mg  Oral QHS  . metoprolol tartrate  12.5 mg Oral BID  . zinc sulfate  220 mg Oral Daily   Continuous Infusions: .  ceFAZolin (ANCEF) IV 2 g (09/02/19 0559)     LOS: 5 days   Time spent= 35 mins    Chavon Lucarelli  Arsenio Loader, MD [facility]  If 7PM-7AM, please contact night-coverage  09/02/2019, 9:53 AM

## 2019-09-02 NOTE — Progress Notes (Signed)
Updated pt wife today. All questions & concerns addressed.  x1 small skid mark today-not considered BM.  Pt sat up in chair for about 6 hours today. Linens changed. Condom cath replaced. Pt required 8-12L/HFNC today. Labored/DOE A/O x4 lethargic, but seemed better this evening-more energy.  Bed remains in low-locked position. Call bell within reach.  Tele/cont O2 monitoring  IV Remdesivir given

## 2019-09-02 NOTE — Plan of Care (Signed)
Pt did attempt to get out of bed to bedside commode alone this morning. Got tangled in lines so took O2 off, sats down to low 70's on RA. Did have a BM, soft but not diarrhea, no blood noted. Pt reminded the need to call for assistance before getting out of bed, for safety and to ensure he has the oxygen needed to keep his O2 sats WDL. Voiced understanding. Bed alarm is set and posey alarm placed. Call bell is within reach and pt reminded of how to use it.  Problem: Education: Goal: Knowledge of risk factors and measures for prevention of condition will improve Outcome: Progressing   Problem: Coping: Goal: Psychosocial and spiritual needs will be supported Outcome: Progressing   Problem: Respiratory: Goal: Will maintain a patent airway Outcome: Progressing Goal: Complications related to the disease process, condition or treatment will be avoided or minimized Outcome: Progressing   Problem: Education: Goal: Knowledge of General Education information will improve Description: Including pain rating scale, medication(s)/side effects and non-pharmacologic comfort measures Outcome: Progressing   Problem: Health Behavior/Discharge Planning: Goal: Ability to manage health-related needs will improve Outcome: Progressing   Problem: Clinical Measurements: Goal: Ability to maintain clinical measurements within normal limits will improve Outcome: Progressing Goal: Will remain free from infection Outcome: Progressing Goal: Diagnostic test results will improve Outcome: Progressing Goal: Respiratory complications will improve Outcome: Progressing Goal: Cardiovascular complication will be avoided Outcome: Progressing   Problem: Activity: Goal: Risk for activity intolerance will decrease Outcome: Progressing   Problem: Nutrition: Goal: Adequate nutrition will be maintained Outcome: Progressing   Problem: Coping: Goal: Level of anxiety will decrease Outcome: Progressing   Problem:  Elimination: Goal: Will not experience complications related to bowel motility Outcome: Progressing Goal: Will not experience complications related to urinary retention Outcome: Progressing   Problem: Pain Managment: Goal: General experience of comfort will improve Outcome: Progressing   Problem: Safety: Goal: Ability to remain free from injury will improve Outcome: Progressing   Problem: Skin Integrity: Goal: Risk for impaired skin integrity will decrease Outcome: Progressing

## 2019-09-03 LAB — GLUCOSE, CAPILLARY
Glucose-Capillary: 159 mg/dL — ABNORMAL HIGH (ref 70–99)
Glucose-Capillary: 157 mg/dL — ABNORMAL HIGH (ref 70–99)
Glucose-Capillary: 194 mg/dL — ABNORMAL HIGH (ref 70–99)
Glucose-Capillary: 258 mg/dL — ABNORMAL HIGH (ref 70–99)

## 2019-09-03 LAB — CBC
HCT: 30.2 % — ABNORMAL LOW (ref 39.0–52.0)
Hemoglobin: 9.2 g/dL — ABNORMAL LOW (ref 13.0–17.0)
MCH: 27.1 pg (ref 26.0–34.0)
MCHC: 30.5 g/dL (ref 30.0–36.0)
MCV: 88.8 fL (ref 80.0–100.0)
Platelets: 241 10*3/uL (ref 150–400)
RBC: 3.4 MIL/uL — ABNORMAL LOW (ref 4.22–5.81)
RDW: 16.9 % — ABNORMAL HIGH (ref 11.5–15.5)
WBC: 11.1 10*3/uL — ABNORMAL HIGH (ref 4.0–10.5)
nRBC: 0 % (ref 0.0–0.2)

## 2019-09-03 LAB — MAGNESIUM: Magnesium: 2.8 mg/dL — ABNORMAL HIGH (ref 1.7–2.4)

## 2019-09-03 LAB — D-DIMER, QUANTITATIVE: D-Dimer, Quant: 2.14 ug/mL-FEU — ABNORMAL HIGH (ref 0.00–0.50)

## 2019-09-03 LAB — LACTATE DEHYDROGENASE: LDH: 557 U/L — ABNORMAL HIGH (ref 98–192)

## 2019-09-03 MED ORDER — METHYLPREDNISOLONE SODIUM SUCC 40 MG IJ SOLR
40.0000 mg | Freq: Two times a day (BID) | INTRAMUSCULAR | Status: DC
  Administered 2019-09-03 – 2019-09-09 (×13): 40 mg via INTRAVENOUS
  Filled 2019-09-03 (×13): qty 1

## 2019-09-03 NOTE — Progress Notes (Signed)
PROGRESS NOTE    Michael [name]  IHK:[id number] DOB: 1940/10/18 DOA: 08/29/2019 PCP: Cassandria Anger, MD   Brief Narrative:  79 year old with history of CKD stage IIIb, paroxysmal A. fib, HTN, CAD, obesity, diastolic CHF, anemia presents to the ER with complains of URI type of symptoms along with fatigue.  Chest x-ray showed multifocal pneumonia, was COVID-19 positive.  Initially admitted to [facility] long hospital due to concerns of GI bleed which was suspected secondary to AVMs, GI was consulted who recommended conservative management for now with endoscopic evaluation in the future.  Showed staphylococcal bacteremia suspicion for contamination versus infection, ID was consulted and initially started on Ancef.  Due to increasing oxygen requirement despite of remdesivir, steroids patient was transferred to [facility] for further management.   Assessment & Plan:   Principal Problem:   COVID-19 virus infection Active Problems:   DM (diabetes mellitus), type 2 with peripheral vascular complications (HCC)   AVM (arteriovenous malformation) of small bowel, acquired with hemorrhage   Iron deficiency anemia due to chronic blood loss   Gout   Persistent atrial fibrillation   Hyperlipidemia   Hypothyroidism   Shortness of breath   Chronic diastolic CHF (congestive heart failure) (HCC)   CKD (chronic kidney disease), stage III   Dyspnea   Positive blood cultures  Acute hypoxic respiratory failure secondary to multifocal COVID-19 pneumonia; minimal to no improvement in last 24 hours -Oxygen levels- Still remains on 8 L high flow, saturating 85-88% -Remdesivir-completed on 2/5 -Decadrone-day 7.  Change Decadron to Solu-Medrol -Routine: Labs have been reviewed including ferritin, LDH, CRP, d-dimer, fibrinogen.  Will need to trend this lab daily. -Vitamin C & Zinc. Prone >16hrs/day.  -procalcitonin-0.64 -Chest x-ray-multifocal pneumonia -Supportive care-antitussive, inhalers,  I-S/flutter -CODE STATUS confirmed -Patient on Cosentyx therefore did not receive Actemra  Staphylococcus coagulase-negative bacteremia, Staph epidermidis -Infectious disease following.  Currently on IV Ancef, last day. -Repeat surveillance cultures-no growth to date  GI bleed, resolved.  Hemoglobin remained stable. History of AVMs -Baseline hemoglobin around 10 -Seen by GI, endoscopic evaluation at a later time but currently proceed with conservative management.  Coronary artery disease status post PCI -Chest pain-free.  On aspirin, beta-blocker and statin  Congestive heart failure with preserved ejection fraction, 65% -Currently appears to be euvolemic.  Daily 40 mg p.o. Lasix. -Supportive care.  History of paroxysmal atrial fibrillation -Currently rate controlled.  On amiodarone, Cardizem and metoprolol -No anticoagulation due to GI bleeds  Essential hypertension, uncontrolled -Continue current medications.  Restart hydralazine, lisinopril  Hypothyroidism -Synthroid  Diabetes mellitus type 2, controlled Peripheral neuropathy secondary to DM2 -Hemoglobin A1c 5.7 -Insulin sliding scale and Accu-Chek -Resume gabapentin  CKD stage IIIb -Baseline creatinine 2.0.  Creatinine improved-2.03 -BUN elevated, suspect from steroid use -Renal ultrasound negative.  History of psoriasis -On Cosentyx  Soreness in his mouth-Magic mouthwash  PT recommends home health, rolling walker and 3 in 1.  This has been ordered.  DVT prophylaxis: SCDs Code Status: Full code Family Communication: Wife did not answer again, left her voicemail. Disposition Plan:   Patient From= home  Patient Anticipated D/C place= Home with home health  Barriers= remains hypoxic and dyspneic requiring 8 L high flow nasal cannula.  Unsafe for discharge.  Maintain hospital stay at this point.  Subjective: Laying in the bed no dyspnea at rest but still requiring 8 L high flow nasal cannula saturating about  85-88% with good waveform.  Review of Systems Otherwise negative except as per HPI, including: General =  no fevers, chills, dizziness,  fatigue HEENT/EYES = negative for loss of vision, double vision, blurred vision,  sore throa Cardiovascular= negative for chest pain, palpitation Respiratory/lungs= negative for shortness of breath hemoptysis,  Gastrointestinal= negative for nausea, vomiting, abdominal pain Genitourinary= negative for Dysuria MSK = Negative for arthralgia, myalgias Neurology= Negative for headache, numbness, tingling  Psychiatry= Negative for suicidal and homocidal ideation Skin= Negative for Rash   Examination:  Constitutional: Not in acute distress, 8 L high flow Respiratory: Bilateral rhonchi Cardiovascular: Normal sinus rhythm, no rubs Abdomen: Nontender nondistended good bowel sounds Musculoskeletal: No edema noted Skin: No rashes seen Neurologic: CN 2-12 grossly intact.  And nonfocal Psychiatric: Normal judgment and insight. Alert and oriented x 3. Normal mood.      Objective: Vitals:   09/02/19 1915 09/03/19 0019 09/03/19 0449 09/03/19 0800  BP: (!) 147/65  (!) 143/56 (!) 144/58  Pulse:   61 67  Resp: (!) 22  (!) 22 (!) 21  Temp: 98.6 F (37 C) 98 F (36.7 C) 98.2 F (36.8 C) 98.1 F (36.7 C)  TempSrc: Oral Oral Oral Oral  SpO2:   95% 91%  Weight:      Height:        Intake/Output Summary (Last 24 hours) at 09/03/2019 1028 Last data filed at 09/03/2019 0600 Gross per 24 hour  Intake 340 ml  Output 1800 ml  Net -1460 ml   Filed Weights   09/24/2019 1830  Weight: 110.7 kg     Data Reviewed:   CBC: Recent Labs  Lab 09/20/2019 1113 09/14/2019 1113 08/29/19 0337 08/29/19 0337 08/30/19 0429 08/31/19 0445 09/01/19 0102 09/02/19 0047 09/03/19 0352  WBC 5.6   < > 5.1   < > 6.4 7.8 7.9 9.7 11.1*  NEUTROABS 5.0  --  4.4  --  5.6 7.1  --   --   --   HGB 8.6*   < > 7.9*   < > 8.2* 8.1* 8.4* 8.8* 9.2*  HCT 27.9*   < > 25.6*   < > 26.5*  25.8* 27.0* 29.3* 30.2*  MCV 87.7   < > 88.6   < > 88.0 88.7 86.8 88.0 88.8  PLT 224   < > 251   < > 243 246 268 239 241   < > = values in this interval not displayed.   Basic Metabolic Panel: Recent Labs  Lab 08/29/19 0337 08/30/19 0429 08/31/19 0445 09/01/19 0102 09/02/19 0047 09/03/19 0352  NA 136 137 138 137 138  --   K 4.4 4.8 4.8 5.0 5.2*  --   CL 105 105 106 106 105  --   CO2 21* 23 23 23 25   --   GLUCOSE 187* 156* 194* 212* 195*  --   BUN 55* 56* 59* 61* 71*  --   CREATININE 2.34* 2.54* 2.29* 2.20* 2.03*  --   CALCIUM 7.5* 7.6* 7.7* 7.8* 8.0*  --   MG  --   --   --  2.7* 2.7* 2.8*   GFR: Estimated Creatinine Clearance: 39.2 mL/min (A) (by C-G formula based on SCr of 2.03 mg/dL (H)). Liver Function Tests: Recent Labs  Lab 08/29/19 0337 08/30/19 0429 08/31/19 0445 09/01/19 0102 09/02/19 0047  AST 31 35 30 32 36  ALT 20 19 15 14 13   ALKPHOS 63 74 69 76 84  BILITOT 0.9 0.7 0.6 0.5 0.6  PROT 5.9* 5.7* 5.8* 6.0* 6.2*  ALBUMIN 2.7* 2.5* 2.4* 2.3* 2.4*   No results for  input(s): LIPASE, AMYLASE in the last 168 hours. No results for input(s): AMMONIA in the last 168 hours. Coagulation Profile: No results for input(s): INR, PROTIME in the last 168 hours. Cardiac Enzymes: No results for input(s): CKTOTAL, CKMB, CKMBINDEX, TROPONINI in the last 168 hours. BNP (last 3 results) No results for input(s): PROBNP in the last 8760 hours. HbA1C: No results for input(s): HGBA1C in the last 72 hours. CBG: Recent Labs  Lab 09/02/19 0714 09/02/19 1102 09/02/19 1602 09/02/19 2043 09/03/19 0803  GLUCAP 155* 154* 272* 247* 159*   Lipid Profile: No results for input(s): CHOL, HDL, LDLCALC, TRIG, CHOLHDL, LDLDIRECT in the last 72 hours. Thyroid Function Tests: No results for input(s): TSH, T4TOTAL, FREET4, T3FREE, THYROIDAB in the last 72 hours. Anemia Panel: Recent Labs    09/01/19 0102 09/02/19 0047  FERRITIN 1,533* 1,676*   Sepsis Labs: Recent Labs  Lab  09/02/2019 1113 09/01/19 0102  PROCALCITON 0.19 0.64  LATICACIDVEN 1.8  --     Recent Results (from the past 240 hour(s))  Blood Culture (routine x 2)     Status: Abnormal   Collection Time: 09/05/2019 11:13 AM   Specimen: BLOOD  Result Value Ref Range Status   Specimen Description   Final    BLOOD RIGHT ANTECUBITAL Performed at [facility] [address] [place], [place] [address]    Special Requests   Final    BOTTLES DRAWN AEROBIC AND ANAEROBIC Blood Culture adequate volume Performed at [facility] [address] [place], [place] [address]    Culture  Setup Time   Final    GRAM POSITIVE COCCI IN BOTH AEROBIC AND ANAEROBIC BOTTLES CRITICAL RESULT CALLED TO, READ BACK BY AND VERIFIED WITH: PHARMD M SWEYNE [id number] AT 75 AM BY CM Performed at [facility], [place] [address] [place], [place] [address]    Culture STAPHYLOCOCCUS EPIDERMIDIS (A)  Final   Report Status 09/01/2019 FINAL  Final   Organism ID, Bacteria STAPHYLOCOCCUS EPIDERMIDIS  Final      Susceptibility   Staphylococcus epidermidis - MIC*    CIPROFLOXACIN <=0.5 SENSITIVE Sensitive     ERYTHROMYCIN <=0.25 SENSITIVE Sensitive     GENTAMICIN <=0.5 SENSITIVE Sensitive     OXACILLIN <=0.25 SENSITIVE Sensitive     TETRACYCLINE <=1 SENSITIVE Sensitive     VANCOMYCIN 1 SENSITIVE Sensitive     TRIMETH/SULFA <=10 SENSITIVE Sensitive     CLINDAMYCIN <=0.25 SENSITIVE Sensitive     RIFAMPIN <=0.5 SENSITIVE Sensitive     Inducible Clindamycin NEGATIVE Sensitive     * STAPHYLOCOCCUS EPIDERMIDIS  Respiratory Panel by RT PCR (Flu A&B, Covid) - Nasopharyngeal Swab     Status: Abnormal   Collection Time: 09/19/2019 12:44 PM   Specimen: Nasopharyngeal Swab  Result Value Ref Range Status   SARS Coronavirus 2 by RT PCR POSITIVE (A) NEGATIVE Final    Comment: RESULT CALLED TO, READ BACK BY AND VERIFIED WITH: C.GRIFFITH AT 1607 ON 09/06/2019 BY N.THOMPSON (NOTE) SARS-CoV-2 target nucleic acids  are DETECTED. SARS-CoV-2 RNA is generally detectable in upper respiratory specimens  during the acute phase of infection. Positive results are indicative of the presence of the identified virus, but do not rule out bacterial infection or co-infection with other pathogens not detected by the test. Clinical correlation with patient history and other diagnostic information is necessary to determine patient infection status. The expected result is Negative. Fact Sheet for Patients:  PinkCheek.be Fact Sheet for Healthcare Providers: GravelBags.it This test is not  yet approved or cleared by the [place] and  has been authorized for detection and/or diagnosis of SARS-CoV-2 by FDA under an Emergency Use Authorization (EUA).  This EUA will remain in effect (meaning this test can be  used) for the duration of  the COVID-19 declaration under Section 564(b)(1) of the Act, 21 U.S.C. section 360bbb-3(b)(1), unless the authorization is terminated or revoked sooner.    Influenza A by PCR NEGATIVE NEGATIVE Final   Influenza B by PCR NEGATIVE NEGATIVE Final    Comment: (NOTE) The Xpert Xpress SARS-CoV-2/FLU/RSV assay is intended as an aid in  the diagnosis of influenza from Nasopharyngeal swab specimens and  should not be used as a sole basis for treatment. Nasal washings and  aspirates are unacceptable for Xpert Xpress SARS-CoV-2/FLU/RSV  testing. Fact Sheet for Patients: PinkCheek.be Fact Sheet for Healthcare Providers: GravelBags.it This test is not yet approved or cleared by the [place] FDA and  has been authorized for detection and/or diagnosis of SARS-CoV-2 by  FDA under an Emergency Use Authorization (EUA). This EUA will remain  in effect (meaning this test can be used) for the duration of the  Covid-19 declaration under Section 564(b)(1) of the Act, 21  U.S.C.  section 360bbb-3(b)(1), unless the authorization is  terminated or revoked. Performed at [facility], [place] [address], [place], [place] [address]   Blood Culture (routine x 2)     Status: Abnormal   Collection Time: 09/16/2019  5:17 PM   Specimen: BLOOD  Result Value Ref Range Status   Specimen Description BLOOD LEFT ANTECUBITAL  Final   Special Requests   Final    BOTTLES DRAWN AEROBIC AND ANAEROBIC Blood Culture results may not be optimal due to an excessive volume of blood received in culture bottles   Culture  Setup Time   Final    GRAM POSITIVE COCCI IN BOTH AEROBIC AND ANAEROBIC BOTTLES CRITICAL VALUE NOTED.  VALUE IS CONSISTENT WITH PREVIOUSLY REPORTED AND CALLED VALUE.    Culture (A)  Final    STAPHYLOCOCCUS EPIDERMIDIS SUSCEPTIBILITIES PERFORMED ON PREVIOUS CULTURE WITHIN THE LAST 5 DAYS. STAPHYLOCOCCUS HOMINIS THE SIGNIFICANCE OF ISOLATING THIS ORGANISM FROM A SINGLE SET OF BLOOD CULTURES WHEN MULTIPLE SETS ARE DRAWN IS UNCERTAIN. PLEASE NOTIFY THE MICROBIOLOGY DEPARTMENT WITHIN ONE WEEK IF SPECIATION AND SENSITIVITIES ARE REQUIRED. Performed at [facility], [place] [address] [place], [place] [address]    Report Status 09/01/2019 FINAL  Final  Blood Culture ID Panel (Reflexed)     Status: Abnormal   Collection Time: 09/23/2019  5:17 PM  Result Value Ref Range Status   Enterococcus species NOT DETECTED NOT DETECTED Final   Listeria monocytogenes NOT DETECTED NOT DETECTED Final   Staphylococcus species DETECTED (A) NOT DETECTED Final    Comment: Methicillin (oxacillin) susceptible coagulase negative staphylococcus. Possible blood culture contaminant (unless isolated from more than one blood culture draw or clinical case suggests pathogenicity). No antibiotic treatment is indicated for blood  culture contaminants. CRITICAL RESULT CALLED TO, READ BACK BY AND VERIFIED WITH: Shelda Jakes PharmD 12:30 08/29/19 ([email or web address])    Staphylococcus aureus (BCID) NOT  DETECTED NOT DETECTED Final   Methicillin resistance NOT DETECTED NOT DETECTED Final   Streptococcus species NOT DETECTED NOT DETECTED Final   Streptococcus agalactiae NOT DETECTED NOT DETECTED Final   Streptococcus pneumoniae NOT DETECTED NOT DETECTED Final   Streptococcus pyogenes NOT DETECTED NOT DETECTED Final   Acinetobacter baumannii NOT DETECTED NOT DETECTED Final   Enterobacteriaceae species NOT  DETECTED NOT DETECTED Final   Enterobacter cloacae complex NOT DETECTED NOT DETECTED Final   Escherichia coli NOT DETECTED NOT DETECTED Final   Klebsiella oxytoca NOT DETECTED NOT DETECTED Final   Klebsiella pneumoniae NOT DETECTED NOT DETECTED Final   Proteus species NOT DETECTED NOT DETECTED Final   Serratia marcescens NOT DETECTED NOT DETECTED Final   Haemophilus influenzae NOT DETECTED NOT DETECTED Final   Neisseria meningitidis NOT DETECTED NOT DETECTED Final   Pseudomonas aeruginosa NOT DETECTED NOT DETECTED Final   Candida albicans NOT DETECTED NOT DETECTED Final   Candida glabrata NOT DETECTED NOT DETECTED Final   Candida krusei NOT DETECTED NOT DETECTED Final   Candida parapsilosis NOT DETECTED NOT DETECTED Final   Candida tropicalis NOT DETECTED NOT DETECTED Final    Comment: Performed at [facility], [place] [address] [place], [place] [address]  Expectorated sputum assessment w rflx to resp cult     Status: None   Collection Time: 08/29/19  5:46 PM   Specimen: Sputum  Result Value Ref Range Status   Specimen Description SPU  Final   Special Requests NONE  Final   Sputum evaluation   Final    Sputum specimen not acceptable for testing.  Please recollect.   Performed at [facility], [place] [address] [place], [place] [address]    Report Status 08/29/2019 FINAL  Final  Culture, blood (routine x 2)     Status: None (Preliminary result)   Collection Time: 08/30/19  2:21 PM   Specimen: BLOOD RIGHT FOREARM  Result Value Ref Range Status   Specimen  Description   Final    BLOOD RIGHT FOREARM Performed at [facility] [address] [place], [place] [address]    Special Requests   Final    BOTTLES DRAWN AEROBIC ONLY Blood Culture adequate volume Performed at [facility] [address] [place], [place] [address]    Culture   Final    NO GROWTH 4 DAYS Performed at [facility], [place] [address], [place], [place] [address]    Report Status PENDING  Incomplete  Culture, blood (routine x 2)     Status: None (Preliminary result)   Collection Time: 08/30/19  2:21 PM   Specimen: BLOOD  Result Value Ref Range Status   Specimen Description   Final    BLOOD RIGHT ANTECUBITAL Performed at [facility] [address] [place], [place] [address]    Special Requests   Final    BOTTLES DRAWN AEROBIC ONLY Blood Culture adequate volume Performed at [facility] [address] [place], [place] [address]    Culture   Final    NO GROWTH 4 DAYS Performed at [facility], [place] [address], [place], [place] [address]    Report Status PENDING  Incomplete         Radiology Studies: No results found.      Scheduled Meds: . allopurinol  200 mg Oral Daily  . amiodarone  200 mg Oral Once per day on Mon Tue Wed Thu Fri Sat  . vitamin C  500 mg Oral Daily  . atorvastatin  40 mg Oral Daily  . diltiazem  180 mg Oral Daily  . fenofibrate  160 mg Oral Daily  . ferrous sulfate  325 mg Oral Daily  . furosemide  40 mg Oral Daily  . gabapentin  300 mg Oral TID  . hydrALAZINE  50 mg Oral  TID  . insulin aspart  0-5 Units Subcutaneous QHS  . insulin aspart  0-9 Units Subcutaneous TID WC  . insulin aspart  4 Units Subcutaneous TID WC  . insulin glargine  25 Units Subcutaneous Daily  . Ipratropium-Albuterol  1 puff Inhalation Q6H  . levothyroxine  150 mcg Oral QAC breakfast  . lisinopril  5 mg Oral QHS  . methylPREDNISolone (SOLU-MEDROL) injection  40 mg  Intravenous Q12H  . metoprolol tartrate  12.5 mg Oral BID  . zinc sulfate  220 mg Oral Daily   Continuous Infusions: .  ceFAZolin (ANCEF) IV 2 g (09/03/19 0527)     LOS: 6 days   Time spent= 35 mins    Brailyn Delman Arsenio Loader, MD [facility]  If 7PM-7AM, please contact night-coverage  09/03/2019, 10:28 AM

## 2019-09-03 NOTE — Progress Notes (Signed)
Patient able to be turned down to 6L HFNC during part of the night while sleeping heavily.  This morning as patient is moving in bed more and awake is now requiring 10L HFNC to keep sats WDL.  Patient has no concerns this morning.

## 2019-09-04 ENCOUNTER — Inpatient Hospital Stay (HOSPITAL_COMMUNITY): Payer: HMO

## 2019-09-04 LAB — CBC
HCT: 32.1 % — ABNORMAL LOW (ref 39.0–52.0)
Hemoglobin: 9.8 g/dL — ABNORMAL LOW (ref 13.0–17.0)
MCH: 26.9 pg (ref 26.0–34.0)
MCHC: 30.5 g/dL (ref 30.0–36.0)
MCV: 88.2 fL (ref 80.0–100.0)
Platelets: 221 10*3/uL (ref 150–400)
RBC: 3.64 MIL/uL — ABNORMAL LOW (ref 4.22–5.81)
RDW: 17 % — ABNORMAL HIGH (ref 11.5–15.5)
WBC: 15.8 10*3/uL — ABNORMAL HIGH (ref 4.0–10.5)
nRBC: 0 % (ref 0.0–0.2)

## 2019-09-04 LAB — BASIC METABOLIC PANEL
Anion gap: 9 (ref 5–15)
BUN: 85 mg/dL — ABNORMAL HIGH (ref 8–23)
CO2: 24 mmol/L (ref 22–32)
Calcium: 7.7 mg/dL — ABNORMAL LOW (ref 8.9–10.3)
Chloride: 105 mmol/L (ref 98–111)
Creatinine, Ser: 2.2 mg/dL — ABNORMAL HIGH (ref 0.61–1.24)
GFR calc Af Amer: 33 mL/min — ABNORMAL LOW (ref >60)
GFR calc non Af Amer: 28 mL/min — ABNORMAL LOW (ref >60)
Glucose, Bld: 211 mg/dL — ABNORMAL HIGH (ref 70–99)
Potassium: 6 mmol/L — ABNORMAL HIGH (ref 3.5–5.1)
Sodium: 138 mmol/L (ref 135–145)

## 2019-09-04 LAB — MAGNESIUM: Magnesium: 2.9 mg/dL — ABNORMAL HIGH (ref 1.7–2.4)

## 2019-09-04 LAB — C-REACTIVE PROTEIN: CRP: 10.8 mg/dL — ABNORMAL HIGH (ref <1.0)

## 2019-09-04 LAB — GLUCOSE, CAPILLARY
Glucose-Capillary: 208 mg/dL — ABNORMAL HIGH (ref 70–99)
Glucose-Capillary: 166 mg/dL — ABNORMAL HIGH (ref 70–99)
Glucose-Capillary: 220 mg/dL — ABNORMAL HIGH (ref 70–99)
Glucose-Capillary: 259 mg/dL — ABNORMAL HIGH (ref 70–99)

## 2019-09-04 LAB — FERRITIN: Ferritin: 1555 ng/mL — ABNORMAL HIGH (ref 24–336)

## 2019-09-04 MED ORDER — SODIUM ZIRCONIUM CYCLOSILICATE 10 G PO PACK
10.0000 g | PACK | Freq: Once | ORAL | Status: AC
  Administered 2019-09-04: 10:00:00 10 g via ORAL
  Filled 2019-09-04: qty 1

## 2019-09-04 MED ORDER — GUAIFENESIN ER 600 MG PO TB12
600.0000 mg | ORAL_TABLET | Freq: Two times a day (BID) | ORAL | Status: DC
  Administered 2019-09-04 – 2019-09-09 (×10): 600 mg via ORAL
  Filled 2019-09-04 (×11): qty 1

## 2019-09-04 MED ORDER — FUROSEMIDE 20 MG PO TABS
40.0000 mg | ORAL_TABLET | Freq: Once | ORAL | Status: AC
  Administered 2019-09-04: 40 mg via ORAL
  Filled 2019-09-04: qty 2

## 2019-09-04 NOTE — Progress Notes (Addendum)
Physical Therapy Treatment Patient Details Name: Michael [name] MRN: [id number] DOB: August 31, 1940 Today's Date: 09/04/2019    History of Present Illness 79 y.o. male with medical history significant of CKD stage 3b, paroxysmal A. fib, hypertension, obesity, coronary artery disease, chronic diastolic CHF, anemia, gout, dyspnea, DM, AVM who presents to the emergency department with complaints of cold-like symptoms, cough, fatigue, poor appetite. In the ED, Chest x-ray done in the emergency department showed multifocal pneumonia. Covid testing confirms COVID pna    PT Comments    Pt demonstrated much improved cognition and safety awareness today compared to evaluation. Pt was aware of situation and willing to participate in PT. Tx focused on sitting up EOB to allow pt to cough up secretions; due to constant coughing up of secretions patient could benefit from respiratory therapy consult for possible breathing treatments.  Pt maintained SPO2 >74% sitting EOB with HR between 68-80 on 15 HFNC, pt unable to ambulate today secondary to expulsion of secretions. Pt was able to perform bed mobility and transfers with minA for cord management and safety today. RN notified of progress.  Pt educated to benefits of sitting up but requested to return to bed today secondary to fatigue following coughing during tx.   Follow Up Recommendations  Home health PT     Equipment Recommendations  Rolling walker with 5" wheels;3in1 (PT)    Recommendations for Other Services Other (comment)(Respiratory for possible breathing treatments)     Precautions / Restrictions Precautions Precautions: Fall;Other (comment)(Airborne) Restrictions Weight Bearing Restrictions: No    Mobility  Bed Mobility Overal bed mobility: Modified Independent Bed Mobility: Rolling;Supine to Sit Rolling: Modified independent (Device/Increase time)   Supine to sit: Supervision(for line management)        Transfers Overall transfer  level: Needs assistance Equipment used: Rolling walker (2 wheeled);None Transfers: Sit to/from Stand Sit to Stand: Supervision            Ambulation/Gait                 Stairs             Wheelchair Mobility    Modified Rankin (Stroke Patients Only)       Balance Overall balance assessment: Needs assistance Sitting-balance support: Feet supported Sitting balance-Leahy Scale: Fair   Postural control: Other (comment)(Anterior lean on support (RW))                                  Cognition Arousal/Alertness: Awake/alert Behavior During Therapy: WFL for tasks assessed/performed Overall Cognitive Status: Impaired/Different from baseline Area of Impairment: Problem solving                   Current Attention Level: Focused         Problem Solving: Decreased initiation General Comments: Pt demonstrates improved self awareness, unable to ambulate today secondary to constant coughing up secretions with sitting EOB.      Exercises Other Exercises Other Exercises: flutter valve x 5 Other Exercises: incentive spirometer x 5(to 1000 ml)    General Comments General comments (skin integrity, edema, etc.): Pt demonstrated fair mobility this session demonstrating modI bed and transfer mobility with RW. Pt demonstrates good safety awareness with sitting EOB and standing tolerance. Pt sat EOB majority of session coughing up secretions with O2 sats dropping to 74%, HR ranged from 68-80. RN notified of patient request for tylenol at end of session.  Pertinent Vitals/Pain Pain Assessment: No/denies pain Pain Score: 0-No pain    Home Living                      Prior Function            PT Goals (current goals can now be found in the care plan section) Acute Rehab PT Goals Patient Stated Goal: get better PT Goal Formulation: With patient Time For Goal Achievement: 09/15/19 Potential to Achieve Goals: Fair Progress  towards PT goals: Not progressing toward goals - comment(Due to respiratory secretions, pt had reduced ambulation)    Frequency    Min 3X/week      PT Plan Current plan remains appropriate    Co-evaluation              AM-PAC PT "6 Clicks" Mobility   Outcome Measure  Help needed turning from your back to your side while in a flat bed without using bedrails?: None Help needed moving from lying on your back to sitting on the side of a flat bed without using bedrails?: None Help needed moving to and from a bed to a chair (including a wheelchair)?: A Little Help needed standing up from a chair using your arms (e.g., wheelchair or bedside chair)?: A Little Help needed to walk in hospital room?: A Little Help needed climbing 3-5 steps with a railing? : A Lot 6 Click Score: 19    End of Session Equipment Utilized During Treatment: Gait belt;Oxygen(RW) Activity Tolerance: Patient limited by fatigue;Other (comment)(Limited due to ) Patient left: in bed;with call bell/phone within reach Nurse Communication: Mobility status;Patient requests pain meds(Pt requested tylenol) PT Visit Diagnosis: Unsteadiness on feet (R26.81);Other abnormalities of gait and mobility (R26.89)     Time: 7253-6644 PT Time Calculation (min) (ACUTE ONLY): 46 min  Charges:  $Therapeutic Exercise: 8-22 mins $Therapeutic Activity: 23-37 mins                     Ann Held PT, DPT Acute Rehab [facility] P: [phone]    Ninoshka Wainwright A Ciarrah Rae 09/04/2019, 11:42 AM

## 2019-09-04 NOTE — Progress Notes (Signed)
Spoke with spouse Samuele Storey and provided updates on patient.

## 2019-09-04 NOTE — Progress Notes (Signed)
Pt wife updated x3 today all questions and concerns addressed.  ** Pt shoe & INSURANCE CARD ARE IN BAG-tied and hanging from counter so that it is seen at d/c. Pt wife & pt is aware of this.   Full linen change, peri care, sacral foam dressing & condom cath replaced this evening.  K of 6 this am- treated & EKG completed this shift Cont tele in place Pt still requiring 12-15L HFNC   IV abx cont Afebrile Pt worked w/ PT today but was unable to get in chair-see PT note for details.  Pt repeatedly encouraged throughout day to use IS/Flutter valve-educated by myself & MD of the importance. Prone encouraged as well.

## 2019-09-04 NOTE — Progress Notes (Signed)
PROGRESS NOTE    Michael [name]  IRW:[id number] DOB: 09/17/1940 DOA: 09/13/2019 PCP: Cassandria Anger, MD   Brief Narrative:  79 year old with history of CKD stage IIIb, paroxysmal A. fib, HTN, CAD, obesity, diastolic CHF, anemia presents to the ER with complains of URI type of symptoms along with fatigue.  Chest x-ray showed multifocal pneumonia, was COVID-19 positive.  Initially admitted to [facility] long hospital due to concerns of GI bleed which was suspected secondary to AVMs, GI was consulted who recommended conservative management for now with endoscopic evaluation in the future.  Showed staphylococcal bacteremia suspicion for contamination versus infection, ID was consulted and initially started on Ancef.  Due to increasing oxygen requirement despite of remdesivir, steroids patient was transferred to [facility] for further management.   Assessment & Plan:   Principal Problem:   COVID-19 virus infection Active Problems:   DM (diabetes mellitus), type 2 with peripheral vascular complications (HCC)   AVM (arteriovenous malformation) of small bowel, acquired with hemorrhage   Iron deficiency anemia due to chronic blood loss   Gout   Persistent atrial fibrillation   Hyperlipidemia   Hypothyroidism   Shortness of breath   Chronic diastolic CHF (congestive heart failure) (HCC)   CKD (chronic kidney disease), stage III   Dyspnea   Positive blood cultures  Acute hypoxic respiratory failure secondary to multifocal COVID-19 pneumonia; minimal to no improvement over last 24 hours -Oxygen levels-Still on >8+ HF.  -Remdesivir-completed on 2/5 -Solu-Medrol-day 8 -Routine: Labs have been reviewed including ferritin, LDH, CRP, d-dimer, fibrinogen.  Will need to trend this lab daily. -Vitamin C & Zinc. Prone >16hrs/day.  -procalcitonin-0.64 -Chest x-ray-multifocal pneumonia -Supportive care-antitussive, inhalers, I-S/flutter -CODE STATUS confirmed -Patient on Cosentyx therefore  did not receive Actemra -Chest x-ray ordered -On daily Lasix, will add 1 additional dose later in the evening today.  Hyperkalemia, 6.0 -Lokelma ordered.  Check EKG.  Staphylococcus coagulase-negative bacteremia, Staph epidermidis -Seen by infectious disease, completed course of Ancef.  Surveillance cultures negative.   GI bleed, resolved.  Hemoglobin remained stable. History of AVMs -Baseline hemoglobin around 10.  Stable at 9.8 -Seen by GI, endoscopic evaluation at a later time but currently proceed with conservative management.  Coronary artery disease status post PCI -Chest pain-free.  On aspirin, beta-blocker and statin  Congestive heart failure with preserved ejection fraction, 65% -Currently appears to be euvolemic.  Continue daily 40 mg p.o. Lasix. -Supportive care.  History of paroxysmal atrial fibrillation -Currently rate controlled.  On amiodarone, Cardizem and metoprolol -No anticoagulation due to GI bleeds  Essential hypertension, uncontrolled -Continue current medications.  Restart hydralazine, lisinopril  Hypothyroidism -Synthroid  Diabetes mellitus type 2, controlled Peripheral neuropathy secondary to DM2 -Hemoglobin A1c 5.7 -Insulin sliding scale and Accu-Chek -Resume gabapentin  CKD stage IIIb -Baseline creatinine 2.0.  Creatinine improved-2.03 -BUN elevated, suspect from steroid use -Renal ultrasound negative.  History of psoriasis -On Cosentyx  Soreness in his mouth-Magic mouthwash  PT recommends home health, rolling walker and 3 in 1.  This has been ordered.  I fear that if patient does not continue using incentive spirometer, out of bed to chair-she is at high risk of worsening respiratory status and possibly leading to intubation.  DVT prophylaxis: SCDs Code Status: Full code Family Communication: Extensively spoke with his son regarding his care and importance of using incentive spirometer and flutter valve around-the-clock. Disposition  Plan:   Patient From= home  Patient Anticipated D/C place= Home with home health  Barriers= patient is still  quite hypoxic and dyspneic with minimal exertion requiring significant amount of high oxygen-8+ high flow nasal cannula.  Remains unsafe for discharge.  Subjective: Remains dyspneic with minimal exertion.  Tells me his lungs are on fire every time he tries to use incentive spirometer.  Frequently has to be reminded to use incentive spirometer but he does not seem motivated.  Review of Systems Otherwise negative except as per HPI, including:  General = no fevers, chills, dizziness,  fatigue HEENT/EYES = negative for loss of vision, double vision, blurred vision,  sore throa Cardiovascular= negative for chest pain, palpitation Respiratory/lungs= negative for  hemoptysis,  Gastrointestinal= negative for nausea, vomiting, abdominal pain Genitourinary= negative for Dysuria MSK = Negative for arthralgia, myalgias Neurology= Negative for headache, numbness, tingling  Psychiatry= Negative for suicidal and homocidal ideation Skin= Negative for Rash   Examination:  Constitutional: Not in acute distress, 8 L high flow Respiratory: Diffuse rhonchi Cardiovascular: Normal sinus rhythm, no rubs Abdomen: Nontender nondistended good bowel sounds Musculoskeletal: No edema noted Skin: No rashes seen Neurologic: CN 2-12 grossly intact.  And nonfocal Psychiatric: Normal judgment and insight. Alert and oriented x 3. Normal mood.     Objective: Vitals:   09/03/19 1600 09/03/19 1930 09/03/19 2116 09/04/19 0429  BP: (!) 143/56  (!) 151/60 (!) 146/56  Pulse: 64  67 64  Resp: (!) 27 18  20   Temp: 98 F (36.7 C) 98 F (36.7 C)  97.9 F (36.6 C)  TempSrc: Oral Oral  Oral  SpO2: 92%   95%  Weight:      Height:        Intake/Output Summary (Last 24 hours) at 09/04/2019 0803 Last data filed at 09/04/2019 0600 Gross per 24 hour  Intake 1240 ml  Output 1700 ml  Net -460 ml   Filed Weights    09/20/2019 1830  Weight: 110.7 kg     Data Reviewed:   CBC: Recent Labs  Lab 09/09/2019 1113 09/03/2019 1113 08/29/19 0337 08/29/19 0337 08/30/19 0429 08/30/19 0429 08/31/19 0445 09/01/19 0102 09/02/19 0047 09/03/19 0352 09/04/19 0418  WBC 5.6   < > 5.1   < > 6.4   < > 7.8 7.9 9.7 11.1* 15.8*  NEUTROABS 5.0  --  4.4  --  5.6  --  7.1  --   --   --   --   HGB 8.6*   < > 7.9*   < > 8.2*   < > 8.1* 8.4* 8.8* 9.2* 9.8*  HCT 27.9*   < > 25.6*   < > 26.5*   < > 25.8* 27.0* 29.3* 30.2* 32.1*  MCV 87.7   < > 88.6   < > 88.0   < > 88.7 86.8 88.0 88.8 88.2  PLT 224   < > 251   < > 243   < > 246 268 239 241 221   < > = values in this interval not displayed.   Basic Metabolic Panel: Recent Labs  Lab 08/30/19 0429 08/31/19 0445 09/01/19 0102 09/02/19 0047 09/03/19 0352 09/04/19 0418  NA 137 138 137 138  --  138  K 4.8 4.8 5.0 5.2*  --  6.0*  CL 105 106 106 105  --  105  CO2 23 23 23 25   --  24  GLUCOSE 156* 194* 212* 195*  --  211*  BUN 56* 59* 61* 71*  --  85*  CREATININE 2.54* 2.29* 2.20* 2.03*  --  2.20*  CALCIUM 7.6* 7.7* 7.8*  8.0*  --  7.7*  MG  --   --  2.7* 2.7* 2.8* 2.9*   GFR: Estimated Creatinine Clearance: 36.2 mL/min (A) (by C-G formula based on SCr of 2.2 mg/dL (H)). Liver Function Tests: Recent Labs  Lab 08/29/19 0337 08/30/19 0429 08/31/19 0445 09/01/19 0102 09/02/19 0047  AST 31 35 30 32 36  ALT 20 19 15 14 13   ALKPHOS 63 74 69 76 84  BILITOT 0.9 0.7 0.6 0.5 0.6  PROT 5.9* 5.7* 5.8* 6.0* 6.2*  ALBUMIN 2.7* 2.5* 2.4* 2.3* 2.4*   No results for input(s): LIPASE, AMYLASE in the last 168 hours. No results for input(s): AMMONIA in the last 168 hours. Coagulation Profile: No results for input(s): INR, PROTIME in the last 168 hours. Cardiac Enzymes: No results for input(s): CKTOTAL, CKMB, CKMBINDEX, TROPONINI in the last 168 hours. BNP (last 3 results) No results for input(s): PROBNP in the last 8760 hours. HbA1C: No results for input(s): HGBA1C in  the last 72 hours. CBG: Recent Labs  Lab 09/02/19 2043 09/03/19 0803 09/03/19 1147 09/03/19 1623 09/03/19 2026  GLUCAP 247* 159* 157* 194* 258*   Lipid Profile: No results for input(s): CHOL, HDL, LDLCALC, TRIG, CHOLHDL, LDLDIRECT in the last 72 hours. Thyroid Function Tests: No results for input(s): TSH, T4TOTAL, FREET4, T3FREE, THYROIDAB in the last 72 hours. Anemia Panel: Recent Labs    09/02/19 0047 09/04/19 0418  FERRITIN 1,676* 1,555*   Sepsis Labs: Recent Labs  Lab 09/15/2019 1113 09/01/19 0102  PROCALCITON 0.19 0.64  LATICACIDVEN 1.8  --     Recent Results (from the past 240 hour(s))  Blood Culture (routine x 2)     Status: Abnormal   Collection Time: 09/24/2019 11:13 AM   Specimen: BLOOD  Result Value Ref Range Status   Specimen Description   Final    BLOOD RIGHT ANTECUBITAL Performed at [facility] [address], [place], [place] [address]    Special Requests   Final    BOTTLES DRAWN AEROBIC AND ANAEROBIC Blood Culture adequate volume Performed at [facility] [address] [place], [place] [address]    Culture  Setup Time   Final    GRAM POSITIVE COCCI IN BOTH AEROBIC AND ANAEROBIC BOTTLES CRITICAL RESULT CALLED TO, READ BACK BY AND VERIFIED WITH: PHARMD M SWEYNE [id number] AT 42 AM BY CM Performed at [facility], [place] [address], [place], [place] [address]    Culture STAPHYLOCOCCUS EPIDERMIDIS (A)  Final   Report Status 09/01/2019 FINAL  Final   Organism ID, Bacteria STAPHYLOCOCCUS EPIDERMIDIS  Final      Susceptibility   Staphylococcus epidermidis - MIC*    CIPROFLOXACIN <=0.5 SENSITIVE Sensitive     ERYTHROMYCIN <=0.25 SENSITIVE Sensitive     GENTAMICIN <=0.5 SENSITIVE Sensitive     OXACILLIN <=0.25 SENSITIVE Sensitive     TETRACYCLINE <=1 SENSITIVE Sensitive     VANCOMYCIN 1 SENSITIVE Sensitive     TRIMETH/SULFA <=10 SENSITIVE Sensitive     CLINDAMYCIN <=0.25 SENSITIVE Sensitive     RIFAMPIN <=0.5  SENSITIVE Sensitive     Inducible Clindamycin NEGATIVE Sensitive     * STAPHYLOCOCCUS EPIDERMIDIS  Respiratory Panel by RT PCR (Flu A&B, Covid) - Nasopharyngeal Swab     Status: Abnormal   Collection Time: 09/22/2019 12:44 PM   Specimen: Nasopharyngeal Swab  Result Value Ref Range Status   SARS Coronavirus 2 by RT PCR POSITIVE (A) NEGATIVE Final    Comment: RESULT CALLED TO, READ  BACK BY AND VERIFIED WITH: C.GRIFFITH AT [id number] ON 09/20/2019 BY N.THOMPSON (NOTE) SARS-CoV-2 target nucleic acids are DETECTED. SARS-CoV-2 RNA is generally detectable in upper respiratory specimens  during the acute phase of infection. Positive results are indicative of the presence of the identified virus, but do not rule out bacterial infection or co-infection with other pathogens not detected by the test. Clinical correlation with patient history and other diagnostic information is necessary to determine patient infection status. The expected result is Negative. Fact Sheet for Patients:  PinkCheek.be Fact Sheet for Healthcare Providers: GravelBags.it This test is not yet approved or cleared by the [place] FDA and  has been authorized for detection and/or diagnosis of SARS-CoV-2 by FDA under an Emergency Use Authorization (EUA).  This EUA will remain in effect (meaning this test can be  used) for the duration of  the COVID-19 declaration under Section 564(b)(1) of the Act, 21 U.S.C. section 360bbb-3(b)(1), unless the authorization is terminated or revoked sooner.    Influenza A by PCR NEGATIVE NEGATIVE Final   Influenza B by PCR NEGATIVE NEGATIVE Final    Comment: (NOTE) The Xpert Xpress SARS-CoV-2/FLU/RSV assay is intended as an aid in  the diagnosis of influenza from Nasopharyngeal swab specimens and  should not be used as a sole basis for treatment. Nasal washings and  aspirates are unacceptable for Xpert Xpress SARS-CoV-2/FLU/RSV   testing. Fact Sheet for Patients: PinkCheek.be Fact Sheet for Healthcare Providers: GravelBags.it This test is not yet approved or cleared by the [place] FDA and  has been authorized for detection and/or diagnosis of SARS-CoV-2 by  FDA under an Emergency Use Authorization (EUA). This EUA will remain  in effect (meaning this test can be used) for the duration of the  Covid-19 declaration under Section 564(b)(1) of the Act, 21  U.S.C. section 360bbb-3(b)(1), unless the authorization is  terminated or revoked. Performed at [facility], [place] [address] [place], [place] [address]   Blood Culture (routine x 2)     Status: Abnormal   Collection Time: 09/21/2019  5:17 PM   Specimen: BLOOD  Result Value Ref Range Status   Specimen Description BLOOD LEFT ANTECUBITAL  Final   Special Requests   Final    BOTTLES DRAWN AEROBIC AND ANAEROBIC Blood Culture results may not be optimal due to an excessive volume of blood received in culture bottles   Culture  Setup Time   Final    GRAM POSITIVE COCCI IN BOTH AEROBIC AND ANAEROBIC BOTTLES CRITICAL VALUE NOTED.  VALUE IS CONSISTENT WITH PREVIOUSLY REPORTED AND CALLED VALUE.    Culture (A)  Final    STAPHYLOCOCCUS EPIDERMIDIS SUSCEPTIBILITIES PERFORMED ON PREVIOUS CULTURE WITHIN THE LAST 5 DAYS. STAPHYLOCOCCUS HOMINIS THE SIGNIFICANCE OF ISOLATING THIS ORGANISM FROM A SINGLE SET OF BLOOD CULTURES WHEN MULTIPLE SETS ARE DRAWN IS UNCERTAIN. PLEASE NOTIFY THE MICROBIOLOGY DEPARTMENT WITHIN ONE WEEK IF SPECIATION AND SENSITIVITIES ARE REQUIRED. Performed at [facility], [place] [address] [place], [place] [address]    Report Status 09/01/2019 FINAL  Final  Blood Culture ID Panel (Reflexed)     Status: Abnormal   Collection Time: 08/29/2019  5:17 PM  Result Value Ref Range Status   Enterococcus species NOT DETECTED NOT DETECTED Final   Listeria monocytogenes NOT  DETECTED NOT DETECTED Final   Staphylococcus species DETECTED (A) NOT DETECTED Final    Comment: Methicillin (oxacillin) susceptible coagulase negative staphylococcus. Possible blood culture contaminant (unless isolated from more than one blood culture draw or  clinical case suggests pathogenicity). No antibiotic treatment is indicated for blood  culture contaminants. CRITICAL RESULT CALLED TO, READ BACK BY AND VERIFIED WITH: Shelda Jakes PharmD 12:30 08/29/19 ([email or web address])    Staphylococcus aureus (BCID) NOT DETECTED NOT DETECTED Final   Methicillin resistance NOT DETECTED NOT DETECTED Final   Streptococcus species NOT DETECTED NOT DETECTED Final   Streptococcus agalactiae NOT DETECTED NOT DETECTED Final   Streptococcus pneumoniae NOT DETECTED NOT DETECTED Final   Streptococcus pyogenes NOT DETECTED NOT DETECTED Final   Acinetobacter baumannii NOT DETECTED NOT DETECTED Final   Enterobacteriaceae species NOT DETECTED NOT DETECTED Final   Enterobacter cloacae complex NOT DETECTED NOT DETECTED Final   Escherichia coli NOT DETECTED NOT DETECTED Final   Klebsiella oxytoca NOT DETECTED NOT DETECTED Final   Klebsiella pneumoniae NOT DETECTED NOT DETECTED Final   Proteus species NOT DETECTED NOT DETECTED Final   Serratia marcescens NOT DETECTED NOT DETECTED Final   Haemophilus influenzae NOT DETECTED NOT DETECTED Final   Neisseria meningitidis NOT DETECTED NOT DETECTED Final   Pseudomonas aeruginosa NOT DETECTED NOT DETECTED Final   Candida albicans NOT DETECTED NOT DETECTED Final   Candida glabrata NOT DETECTED NOT DETECTED Final   Candida krusei NOT DETECTED NOT DETECTED Final   Candida parapsilosis NOT DETECTED NOT DETECTED Final   Candida tropicalis NOT DETECTED NOT DETECTED Final    Comment: Performed at [facility], [address], [place], [place] [address]  Expectorated sputum assessment w rflx to resp cult     Status: None   Collection Time: 08/29/19  5:46 PM   Specimen: Sputum   Result Value Ref Range Status   Specimen Description SPU  Final   Special Requests NONE  Final   Sputum evaluation   Final    Sputum specimen not acceptable for testing.  Please recollect.   Performed at [facility], [place] [address] [place], [place] [address]    Report Status 08/29/2019 FINAL  Final  Culture, blood (routine x 2)     Status: None (Preliminary result)   Collection Time: 08/30/19  2:21 PM   Specimen: BLOOD RIGHT FOREARM  Result Value Ref Range Status   Specimen Description   Final    BLOOD RIGHT FOREARM Performed at [facility] [address] [place], [place] [address]    Special Requests   Final    BOTTLES DRAWN AEROBIC ONLY Blood Culture adequate volume Performed at [facility] [address], [place], [place] [address]    Culture   Final    NO GROWTH 4 DAYS Performed at [facility], [place] [address] [place], [place] [address]    Report Status PENDING  Incomplete  Culture, blood (routine x 2)     Status: None (Preliminary result)   Collection Time: 08/30/19  2:21 PM   Specimen: BLOOD  Result Value Ref Range Status   Specimen Description   Final    BLOOD RIGHT ANTECUBITAL Performed at [facility] [address], [place], [place] [address]    Special Requests   Final    BOTTLES DRAWN AEROBIC ONLY Blood Culture adequate volume Performed at [facility] [address] [place], [place] [address]    Culture   Final    NO GROWTH 4 DAYS Performed at [facility], [place] [address], [place],  [address]    Report Status PENDING  Incomplete         Radiology  Studies: No results found.      Scheduled Meds: . allopurinol  200 mg Oral Daily  . amiodarone  200 mg Oral Once per day on Mon Tue Wed Thu Fri Sat  . vitamin C  500 mg Oral Daily  . atorvastatin  40 mg Oral Daily  . diltiazem  180 mg Oral Daily  . fenofibrate  160 mg Oral  Daily  . ferrous sulfate  325 mg Oral Daily  . furosemide  40 mg Oral Daily  . gabapentin  300 mg Oral TID  . hydrALAZINE  50 mg Oral TID  . insulin aspart  0-5 Units Subcutaneous QHS  . insulin aspart  0-9 Units Subcutaneous TID WC  . insulin aspart  4 Units Subcutaneous TID WC  . insulin glargine  25 Units Subcutaneous Daily  . Ipratropium-Albuterol  1 puff Inhalation Q6H  . levothyroxine  150 mcg Oral QAC breakfast  . lisinopril  5 mg Oral QHS  . methylPREDNISolone (SOLU-MEDROL) injection  40 mg Intravenous Q12H  . metoprolol tartrate  12.5 mg Oral BID  . sodium zirconium cyclosilicate  10 g Oral Once  . zinc sulfate  220 mg Oral Daily   Continuous Infusions: .  ceFAZolin (ANCEF) IV 2 g (09/04/19 0539)     LOS: 7 days   Time spent= 35 mins    Mischa Brittingham Arsenio Loader, MD [facility]  If 7PM-7AM, please contact night-coverage  09/04/2019, 8:03 AM

## 2019-09-05 DIAGNOSIS — D500 Iron deficiency anemia secondary to blood loss (chronic): Secondary | ICD-10-CM

## 2019-09-05 LAB — BASIC METABOLIC PANEL
Anion gap: 9 (ref 5–15)
BUN: 89 mg/dL — ABNORMAL HIGH (ref 8–23)
CO2: 24 mmol/L (ref 22–32)
Calcium: 7.5 mg/dL — ABNORMAL LOW (ref 8.9–10.3)
Chloride: 105 mmol/L (ref 98–111)
Creatinine, Ser: 2.34 mg/dL — ABNORMAL HIGH (ref 0.61–1.24)
GFR calc Af Amer: 30 mL/min — ABNORMAL LOW (ref >60)
GFR calc non Af Amer: 26 mL/min — ABNORMAL LOW (ref >60)
Glucose, Bld: 193 mg/dL — ABNORMAL HIGH (ref 70–99)
Potassium: 6 mmol/L — ABNORMAL HIGH (ref 3.5–5.1)
Sodium: 138 mmol/L (ref 135–145)

## 2019-09-05 LAB — CBC
HCT: 32.1 % — ABNORMAL LOW (ref 39.0–52.0)
Hemoglobin: 9.8 g/dL — ABNORMAL LOW (ref 13.0–17.0)
MCH: 27.1 pg (ref 26.0–34.0)
MCHC: 30.5 g/dL (ref 30.0–36.0)
MCV: 88.7 fL (ref 80.0–100.0)
Platelets: 188 10*3/uL (ref 150–400)
RBC: 3.62 MIL/uL — ABNORMAL LOW (ref 4.22–5.81)
RDW: 17.2 % — ABNORMAL HIGH (ref 11.5–15.5)
WBC: 13.9 10*3/uL — ABNORMAL HIGH (ref 4.0–10.5)
nRBC: 0 % (ref 0.0–0.2)

## 2019-09-05 LAB — C-REACTIVE PROTEIN: CRP: 9.6 mg/dL — ABNORMAL HIGH (ref <1.0)

## 2019-09-05 LAB — GLUCOSE, CAPILLARY
Glucose-Capillary: 206 mg/dL — ABNORMAL HIGH (ref 70–99)
Glucose-Capillary: 209 mg/dL — ABNORMAL HIGH (ref 70–99)
Glucose-Capillary: 241 mg/dL — ABNORMAL HIGH (ref 70–99)
Glucose-Capillary: 173 mg/dL — ABNORMAL HIGH (ref 70–99)

## 2019-09-05 LAB — MAGNESIUM: Magnesium: 2.8 mg/dL — ABNORMAL HIGH (ref 1.7–2.4)

## 2019-09-05 LAB — PROCALCITONIN: Procalcitonin: 0.47 ng/mL

## 2019-09-05 LAB — BRAIN NATRIURETIC PEPTIDE: B Natriuretic Peptide: 493.8 pg/mL — ABNORMAL HIGH (ref 0.0–100.0)

## 2019-09-05 LAB — FERRITIN: Ferritin: 1523 ng/mL — ABNORMAL HIGH (ref 24–336)

## 2019-09-05 MED ORDER — SODIUM ZIRCONIUM CYCLOSILICATE 10 G PO PACK
10.0000 g | PACK | Freq: Once | ORAL | Status: AC
  Administered 2019-09-05: 09:00:00 10 g via ORAL
  Filled 2019-09-05 (×2): qty 1

## 2019-09-05 MED ORDER — INSULIN ASPART 100 UNIT/ML ~~LOC~~ SOLN
6.0000 [IU] | Freq: Three times a day (TID) | SUBCUTANEOUS | Status: DC
  Administered 2019-09-05 – 2019-09-08 (×10): 6 [IU] via SUBCUTANEOUS

## 2019-09-05 MED ORDER — FUROSEMIDE 20 MG PO TABS
40.0000 mg | ORAL_TABLET | Freq: Two times a day (BID) | ORAL | Status: AC
  Administered 2019-09-05 (×2): 40 mg via ORAL
  Filled 2019-09-05 (×2): qty 2

## 2019-09-05 MED ORDER — INSULIN GLARGINE 100 UNIT/ML ~~LOC~~ SOLN
28.0000 [IU] | Freq: Every day | SUBCUTANEOUS | Status: DC
  Administered 2019-09-06 – 2019-09-07 (×2): 28 [IU] via SUBCUTANEOUS
  Filled 2019-09-05 (×3): qty 0.28

## 2019-09-05 NOTE — Progress Notes (Signed)
Inpatient Diabetes Program Recommendations  AACE/ADA: New Consensus Statement on Inpatient Glycemic Control (2015)  Target Ranges:  Prepandial:   less than 140 mg/dL      Peak postprandial:   less than 180 mg/dL (1-2 hours)      Critically ill patients:  140 - 180 mg/dL   Lab Results  Component Value Date   GLUCAP 206 (H) 09/05/2019   HGBA1C 5.7 (H) 09/01/2019    Review of Glycemic Control  Blood sugars still above goal of 140-180 mg/dL.  Inpatient Diabetes Program Recommendations:     Increase Lantus to 28 units QD Increase Novolog to 6 units tidwc for meal coverage insulin.  Will continue to follow closely.  Thank you. Lorenda Peck, RD, LDN, CDE Inpatient Diabetes Coordinator [phone]

## 2019-09-05 NOTE — Progress Notes (Signed)
PROGRESS NOTE    Michael [name]  RFX:[id number] DOB: Sep 20, 1940 DOA: 08/29/2019 PCP: Cassandria Anger, MD   Brief Narrative:  79 year old with history of CKD stage IIIb, paroxysmal A. fib, HTN, CAD, obesity, diastolic CHF, anemia presents to the ER with complains of URI type of symptoms along with fatigue.  Chest x-ray showed multifocal pneumonia, was COVID-19 positive.  Initially admitted to [facility] long hospital due to concerns of GI bleed which was suspected secondary to AVMs, GI was consulted who recommended conservative management for now with endoscopic evaluation in the future.  Showed staphylococcal bacteremia suspicion for contamination versus infection, ID was consulted and initially started on Ancef.  Due to increasing oxygen requirement despite of remdesivir, steroids patient was transferred to [facility] for further management.  During the hospitalization completed remdesivir 2/5.  On Solu-Medrol.  Needs constant encouragement to use incentive spirometer and flutter valve.   Assessment & Plan:   Principal Problem:   COVID-19 virus infection Active Problems:   DM (diabetes mellitus), type 2 with peripheral vascular complications (HCC)   AVM (arteriovenous malformation) of small bowel, acquired with hemorrhage   Iron deficiency anemia due to chronic blood loss   Gout   Persistent atrial fibrillation   Hyperlipidemia   Hypothyroidism   Shortness of breath   Chronic diastolic CHF (congestive heart failure) (HCC)   CKD (chronic kidney disease), stage III   Dyspnea   Positive blood cultures  Acute hypoxic respiratory failure secondary to multifocal COVID-19 pneumonia; no improvement over last 24 hours -Oxygen levels-15 L high flow with nonrebreather -Remdesivir-completed course 2/5 -Solu-Medrol-day 9 -Routine: Labs have been reviewed including ferritin, LDH, CRP, d-dimer, fibrinogen.  Will need to trend this lab daily. -Vitamin C & Zinc. Prone >16hrs/day.   -procalcitonin-0.47 -Chest x-ray-multifocal pneumonia -Supportive care-antitussive, inhalers, I-S/flutter -CODE STATUS confirmed -Patient on Cosentyx therefore did not receive Actemra -Chest x-ray ordered -Daily Lasix  Hyperkalemia, 6.0 -EKG unremarkable.  Given meds a dose of Lokelma today.  Staphylococcus coagulase-negative bacteremia, Staph epidermidis -Seen by infectious disease, completed Ancef.  Surveillance cultures-negative  GI bleed, resolved.  Hemoglobin remained stable. History of AVMs -Baseline hemoglobin around 10.  Stable at 9.8 -Seen by GI, endoscopic evaluation at a later time but currently proceed with conservative management.  Evaluated by their service prior to admission here.  Coronary artery disease status post PCI -Chest pain-free.  On aspirin, beta-blocker and statin  Congestive heart failure with preserved ejection fraction, 65% -Currently appears to be euvolemic.  Continue daily 40 mg p.o. Lasix. -Supportive care.  History of paroxysmal atrial fibrillation -Currently rate controlled.  On amiodarone, Cardizem and metoprolol -No anticoagulation due to GI bleeds  Essential hypertension, uncontrolled -Continue current medications.  Restart hydralazine, lisinopril  Hypothyroidism -Synthroid  Diabetes mellitus type 2, controlled Peripheral neuropathy secondary to DM2 -Hemoglobin A1c 5.7 -Insulin sliding scale and Accu-Chek -Resume gabapentin  CKD stage IIIb -Baseline creatinine 2.0.  Creatinine improved-2.03 -BUN elevated, suspect from steroid use -Renal ultrasound negative.  History of psoriasis -On Cosentyx  Soreness in his mouth-Magic mouthwash  PT recommending home health. Patient has been extensively educated to use incentive spirometer and flutter valve.  DVT prophylaxis: SCDs Code Status: Full code Family Communication: Mikeal Hawthorne, Left him a voicemail. Spoke with [name]- She is updated.  Disposition Plan:   Patient From=  home  Patient Anticipated D/C place= Home with home health  Barriers= still remains very dyspneic and hypoxic requiring 15 L high flow with nonrebreather.  Subjective: Continues not to  use his incentive spirometer and flutter valve.  When I was in the room admitting use it, he did have significant amount of cough after using it.. During that time his oxygen saturation dropped down to 75% on nonrebreather and then quickly recovered back up to 95%.  I was able to wean down his high flow to 10 L.  Review of Systems Otherwise negative except as per HPI, including:  General = no fevers, chills, dizziness,  fatigue HEENT/EYES = negative for loss of vision, double vision, blurred vision,  sore throa Cardiovascular= negative for chest pain, palpitation Respiratory/lungs= negative for hemoptysis Gastrointestinal= negative for nausea, vomiting, abdominal pain Genitourinary= negative for Dysuria MSK = Negative for arthralgia, myalgias Neurology= Negative for headache, numbness, tingling  Psychiatry= Negative for suicidal and homocidal ideation Skin= Negative for Rash   Examination:  Constitutional: Mild distress due to hypoxia but at rest feels okay.  On 10 L high flow with nonrebreather Respiratory: Diffuse rhonchi Cardiovascular: Normal sinus rhythm, no rubs Abdomen: Nontender nondistended good bowel sounds Musculoskeletal: No edema noted Skin: No rashes seen Neurologic: CN 2-12 grossly intact.  And nonfocal Psychiatric: Normal judgment and insight. Alert and oriented x 3. Normal mood.     Objective: Vitals:   09/05/19 0056 09/05/19 0400 09/05/19 0407 09/05/19 0727  BP: (!) 147/61 (!) 142/56    Pulse: (!) 57 (!) 56 (!) 56 (!) 57  Resp: 17 18 17 19   Temp: 98.6 F (37 C)  98.4 F (36.9 C) (!) 97.4 F (36.3 C)  TempSrc: Axillary  Oral Oral  SpO2: 100% 95% 97% 91%  Weight:      Height:        Intake/Output Summary (Last 24 hours) at 09/05/2019 1133 Last data filed at 09/05/2019  1062 Gross per 24 hour  Intake 480 ml  Output 700 ml  Net -220 ml   Filed Weights   09/04/2019 1830  Weight: 110.7 kg     Data Reviewed:   CBC: Recent Labs  Lab 08/30/19 0429 08/30/19 0429 08/31/19 0445 08/31/19 0445 09/01/19 0102 09/02/19 0047 09/03/19 0352 09/04/19 0418 09/05/19 0256  WBC 6.4   < > 7.8   < > 7.9 9.7 11.1* 15.8* 13.9*  NEUTROABS 5.6  --  7.1  --   --   --   --   --   --   HGB 8.2*   < > 8.1*   < > 8.4* 8.8* 9.2* 9.8* 9.8*  HCT 26.5*   < > 25.8*   < > 27.0* 29.3* 30.2* 32.1* 32.1*  MCV 88.0   < > 88.7   < > 86.8 88.0 88.8 88.2 88.7  PLT 243   < > 246   < > 268 239 241 221 188   < > = values in this interval not displayed.   Basic Metabolic Panel: Recent Labs  Lab 08/31/19 0445 09/01/19 0102 09/02/19 0047 09/03/19 0352 09/04/19 0418 09/05/19 0256  NA 138 137 138  --  138 138  K 4.8 5.0 5.2*  --  6.0* 6.0*  CL 106 106 105  --  105 105  CO2 23 23 25   --  24 24  GLUCOSE 194* 212* 195*  --  211* 193*  BUN 59* 61* 71*  --  85* 89*  CREATININE 2.29* 2.20* 2.03*  --  2.20* 2.34*  CALCIUM 7.7* 7.8* 8.0*  --  7.7* 7.5*  MG  --  2.7* 2.7* 2.8* 2.9* 2.8*   GFR: Estimated Creatinine Clearance:  34 mL/min (A) (by C-G formula based on SCr of 2.34 mg/dL (H)). Liver Function Tests: Recent Labs  Lab 08/30/19 0429 08/31/19 0445 09/01/19 0102 09/02/19 0047  AST 35 30 32 36  ALT 19 15 14 13   ALKPHOS 74 69 76 84  BILITOT 0.7 0.6 0.5 0.6  PROT 5.7* 5.8* 6.0* 6.2*  ALBUMIN 2.5* 2.4* 2.3* 2.4*   No results for input(s): LIPASE, AMYLASE in the last 168 hours. No results for input(s): AMMONIA in the last 168 hours. Coagulation Profile: No results for input(s): INR, PROTIME in the last 168 hours. Cardiac Enzymes: No results for input(s): CKTOTAL, CKMB, CKMBINDEX, TROPONINI in the last 168 hours. BNP (last 3 results) No results for input(s): PROBNP in the last 8760 hours. HbA1C: No results for input(s): HGBA1C in the last 72 hours. CBG: Recent Labs   Lab 09/04/19 0749 09/04/19 1232 09/04/19 1835 09/04/19 2131 09/05/19 0725  GLUCAP 208* 166* 220* 259* 206*   Lipid Profile: No results for input(s): CHOL, HDL, LDLCALC, TRIG, CHOLHDL, LDLDIRECT in the last 72 hours. Thyroid Function Tests: No results for input(s): TSH, T4TOTAL, FREET4, T3FREE, THYROIDAB in the last 72 hours. Anemia Panel: Recent Labs    09/04/19 0418 09/05/19 0256  FERRITIN 1,555* 1,523*   Sepsis Labs: Recent Labs  Lab 09/01/19 0102 09/05/19 0256  PROCALCITON 0.64 0.47    Recent Results (from the past 240 hour(s))  Blood Culture (routine x 2)     Status: Abnormal   Collection Time: 08/31/2019 11:13 AM   Specimen: BLOOD  Result Value Ref Range Status   Specimen Description   Final    BLOOD RIGHT ANTECUBITAL Performed at [facility], [place] [address], [place], [place] [address]    Special Requests   Final    BOTTLES DRAWN AEROBIC AND ANAEROBIC Blood Culture adequate volume Performed at [place] [address], [place], [place] [address]    Culture  Setup Time   Final    GRAM POSITIVE COCCI IN BOTH AEROBIC AND ANAEROBIC BOTTLES CRITICAL RESULT CALLED TO, READ BACK BY AND VERIFIED WITH: PHARMD M SWEYNE [id number] AT 48 AM BY CM Performed at [facility], [place] [address] [place], [place] [address]    Culture STAPHYLOCOCCUS EPIDERMIDIS (A)  Final   Report Status 09/01/2019 FINAL  Final   Organism ID, Bacteria STAPHYLOCOCCUS EPIDERMIDIS  Final      Susceptibility   Staphylococcus epidermidis - MIC*    CIPROFLOXACIN <=0.5 SENSITIVE Sensitive     ERYTHROMYCIN <=0.25 SENSITIVE Sensitive     GENTAMICIN <=0.5 SENSITIVE Sensitive     OXACILLIN <=0.25 SENSITIVE Sensitive     TETRACYCLINE <=1 SENSITIVE Sensitive     VANCOMYCIN 1 SENSITIVE Sensitive     TRIMETH/SULFA <=10 SENSITIVE Sensitive     CLINDAMYCIN <=0.25 SENSITIVE Sensitive     RIFAMPIN <=0.5 SENSITIVE Sensitive     Inducible Clindamycin NEGATIVE  Sensitive     * STAPHYLOCOCCUS EPIDERMIDIS  Respiratory Panel by RT PCR (Flu A&B, Covid) - Nasopharyngeal Swab     Status: Abnormal   Collection Time: 09/20/2019 12:44 PM   Specimen: Nasopharyngeal Swab  Result Value Ref Range Status   SARS Coronavirus 2 by RT PCR POSITIVE (A) NEGATIVE Final    Comment: RESULT CALLED TO, READ BACK BY AND VERIFIED WITH: C.GRIFFITH AT 1607 ON 09/07/2019 BY N.THOMPSON (NOTE) SARS-CoV-2 target nucleic acids are DETECTED. SARS-CoV-2 RNA is generally detectable in upper respiratory specimens  during the acute phase of infection. Positive results  are indicative of the presence of the identified virus, but do not rule out bacterial infection or co-infection with other pathogens not detected by the test. Clinical correlation with patient history and other diagnostic information is necessary to determine patient infection status. The expected result is Negative. Fact Sheet for Patients:  PinkCheek.be Fact Sheet for Healthcare Providers: GravelBags.it This test is not yet approved or cleared by the [place] FDA and  has been authorized for detection and/or diagnosis of SARS-CoV-2 by FDA under an Emergency Use Authorization (EUA).  This EUA will remain in effect (meaning this test can be  used) for the duration of  the COVID-19 declaration under Section 564(b)(1) of the Act, 21 U.S.C. section 360bbb-3(b)(1), unless the authorization is terminated or revoked sooner.    Influenza A by PCR NEGATIVE NEGATIVE Final   Influenza B by PCR NEGATIVE NEGATIVE Final    Comment: (NOTE) The Xpert Xpress SARS-CoV-2/FLU/RSV assay is intended as an aid in  the diagnosis of influenza from Nasopharyngeal swab specimens and  should not be used as a sole basis for treatment. Nasal washings and  aspirates are unacceptable for Xpert Xpress SARS-CoV-2/FLU/RSV  testing. Fact Sheet for  Patients: PinkCheek.be Fact Sheet for Healthcare Providers: GravelBags.it This test is not yet approved or cleared by the [place] FDA and  has been authorized for detection and/or diagnosis of SARS-CoV-2 by  FDA under an Emergency Use Authorization (EUA). This EUA will remain  in effect (meaning this test can be used) for the duration of the  Covid-19 declaration under Section 564(b)(1) of the Act, 21  U.S.C. section 360bbb-3(b)(1), unless the authorization is  terminated or revoked. Performed at [facility], [place] [address] [place], [place] [address]   Blood Culture (routine x 2)     Status: Abnormal   Collection Time: 09/14/2019  5:17 PM   Specimen: BLOOD  Result Value Ref Range Status   Specimen Description BLOOD LEFT ANTECUBITAL  Final   Special Requests   Final    BOTTLES DRAWN AEROBIC AND ANAEROBIC Blood Culture results may not be optimal due to an excessive volume of blood received in culture bottles   Culture  Setup Time   Final    GRAM POSITIVE COCCI IN BOTH AEROBIC AND ANAEROBIC BOTTLES CRITICAL VALUE NOTED.  VALUE IS CONSISTENT WITH PREVIOUSLY REPORTED AND CALLED VALUE.    Culture (A)  Final    STAPHYLOCOCCUS EPIDERMIDIS SUSCEPTIBILITIES PERFORMED ON PREVIOUS CULTURE WITHIN THE LAST 5 DAYS. STAPHYLOCOCCUS HOMINIS THE SIGNIFICANCE OF ISOLATING THIS ORGANISM FROM A SINGLE SET OF BLOOD CULTURES WHEN MULTIPLE SETS ARE DRAWN IS UNCERTAIN. PLEASE NOTIFY THE MICROBIOLOGY DEPARTMENT WITHIN ONE WEEK IF SPECIATION AND SENSITIVITIES ARE REQUIRED. Performed at [facility], [place] [address] [place], [place] [address]    Report Status 09/01/2019 FINAL  Final  Blood Culture ID Panel (Reflexed)     Status: Abnormal   Collection Time: 09/09/2019  5:17 PM  Result Value Ref Range Status   Enterococcus species NOT DETECTED NOT DETECTED Final   Listeria monocytogenes NOT DETECTED NOT DETECTED Final    Staphylococcus species DETECTED (A) NOT DETECTED Final    Comment: Methicillin (oxacillin) susceptible coagulase negative staphylococcus. Possible blood culture contaminant (unless isolated from more than one blood culture draw or clinical case suggests pathogenicity). No antibiotic treatment is indicated for blood  culture contaminants. CRITICAL RESULT CALLED TO, READ BACK BY AND VERIFIED WITH: Shelda Jakes PharmD 12:30 08/29/19 ([email or web address])    Staphylococcus aureus (BCID) NOT  DETECTED NOT DETECTED Final   Methicillin resistance NOT DETECTED NOT DETECTED Final   Streptococcus species NOT DETECTED NOT DETECTED Final   Streptococcus agalactiae NOT DETECTED NOT DETECTED Final   Streptococcus pneumoniae NOT DETECTED NOT DETECTED Final   Streptococcus pyogenes NOT DETECTED NOT DETECTED Final   Acinetobacter baumannii NOT DETECTED NOT DETECTED Final   Enterobacteriaceae species NOT DETECTED NOT DETECTED Final   Enterobacter cloacae complex NOT DETECTED NOT DETECTED Final   Escherichia coli NOT DETECTED NOT DETECTED Final   Klebsiella oxytoca NOT DETECTED NOT DETECTED Final   Klebsiella pneumoniae NOT DETECTED NOT DETECTED Final   Proteus species NOT DETECTED NOT DETECTED Final   Serratia marcescens NOT DETECTED NOT DETECTED Final   Haemophilus influenzae NOT DETECTED NOT DETECTED Final   Neisseria meningitidis NOT DETECTED NOT DETECTED Final   Pseudomonas aeruginosa NOT DETECTED NOT DETECTED Final   Candida albicans NOT DETECTED NOT DETECTED Final   Candida glabrata NOT DETECTED NOT DETECTED Final   Candida krusei NOT DETECTED NOT DETECTED Final   Candida parapsilosis NOT DETECTED NOT DETECTED Final   Candida tropicalis NOT DETECTED NOT DETECTED Final    Comment: Performed at [facility], [place] [address], [place], [place] [address]  Expectorated sputum assessment w rflx to resp cult     Status: None   Collection Time: 08/29/19  5:46 PM   Specimen: Sputum  Result Value Ref Range Status    Specimen Description SPU  Final   Special Requests NONE  Final   Sputum evaluation   Final    Sputum specimen not acceptable for testing.  Please recollect.   Performed at [facility], [place] [address], [place], [place] [address]    Report Status 08/29/2019 FINAL  Final  Culture, blood (routine x 2)     Status: None (Preliminary result)   Collection Time: 08/30/19  2:21 PM   Specimen: BLOOD RIGHT FOREARM  Result Value Ref Range Status   Specimen Description   Final    BLOOD RIGHT FOREARM Performed at [place] [address] [place], [place] [address]    Special Requests   Final    BOTTLES DRAWN AEROBIC ONLY Blood Culture adequate volume Performed at [place] [address], [place], [place] [address]    Culture   Final    NO GROWTH 4 DAYS Performed at [facility], [place] [address] [place], [place] [address]    Report Status PENDING  Incomplete  Culture, blood (routine x 2)     Status: None (Preliminary result)   Collection Time: 08/30/19  2:21 PM   Specimen: BLOOD  Result Value Ref Range Status   Specimen Description   Final    BLOOD RIGHT ANTECUBITAL Performed at [place] [address] [place], [place] [address]    Special Requests   Final    BOTTLES DRAWN AEROBIC ONLY Blood Culture adequate volume Performed at [place] [address], [place], [place] [address]    Culture   Final    NO GROWTH 4 DAYS Performed at [facility], [place] [address] [place],  [address]    Report Status PENDING  Incomplete         Radiology Studies: DG Chest Port 1 View  Result Date: 09/04/2019 CLINICAL DATA:  Weakness EXAM: PORTABLE CHEST 1 VIEW COMPARISON:  09/24/2019 FINDINGS: Cardiac shadow is prominent. Aortic calcifications are again seen. Increasing airspace opacities are noted  throughout both lungs when compared with the prior exam. This is again  consistent with multifocal pneumonia consistent with the given clinical history. No bony abnormality is seen. IMPRESSION: Diffuse bilateral airspace opacities increased from the prior exam but consistent with the given clinical history. Electronically Signed   By: Inez Catalina M.D.   On: 09/04/2019 08:59        Scheduled Meds: . allopurinol  200 mg Oral Daily  . amiodarone  200 mg Oral Once per day on Mon Tue Wed Thu Fri Sat  . vitamin C  500 mg Oral Daily  . atorvastatin  40 mg Oral Daily  . diltiazem  180 mg Oral Daily  . fenofibrate  160 mg Oral Daily  . ferrous sulfate  325 mg Oral Daily  . furosemide  40 mg Oral BID  . gabapentin  300 mg Oral TID  . guaiFENesin  600 mg Oral BID  . hydrALAZINE  50 mg Oral TID  . insulin aspart  0-5 Units Subcutaneous QHS  . insulin aspart  0-9 Units Subcutaneous TID WC  . insulin aspart  4 Units Subcutaneous TID WC  . insulin glargine  25 Units Subcutaneous Daily  . Ipratropium-Albuterol  1 puff Inhalation Q6H  . levothyroxine  150 mcg Oral QAC breakfast  . lisinopril  5 mg Oral QHS  . methylPREDNISolone (SOLU-MEDROL) injection  40 mg Intravenous Q12H  . metoprolol tartrate  12.5 mg Oral BID  . zinc sulfate  220 mg Oral Daily   Continuous Infusions:    LOS: 8 days   Time spent= 35 mins    Storm Sovine Arsenio Loader, MD [facility]  If 7PM-7AM, please contact night-coverage  09/05/2019, 11:33 AM

## 2019-09-05 NOTE — Progress Notes (Signed)
Occupational Therapy Treatment Patient Details Name: Michael [name] MRN: [id number] DOB: 02-05-1941 Today's Date: 09/05/2019    History of present illness 79 y.o. male with medical history significant of CKD stage 3b, paroxysmal A. fib, hypertension, obesity, coronary artery disease, chronic diastolic CHF, anemia, gout, dyspnea, DM, AVM who presents to the emergency department with complaints of cold-like symptoms, cough, fatigue, poor appetite. In the ED, Chest x-ray done in the emergency department showed multifocal pneumonia. Covid testing confirms COVID pna   OT comments  Patient is making slow progress toward OT goals. Patient exhibits increased dyspnea upon exertion resulting in increased level of assist with LB dressing, perihygiene for toileting and bathing at Max to total A level. Patient requires increased time for bed mobility and CGA bed<>BSC transfers. Patient's O2 stats dropped to low 80s on 10L HFNC, 15L non-rebreather during toileting, bathing, and dressing session. Patient required 5-6 min recovery time and several rest breaks. Patient educated on the importance of sitting OOB, but declined the recliner. Patient participated in cognitive task of word finding to improve focus and attention to task.  Patient will benefit from continued OT services in the acute setting.     Follow Up Recommendations  Home health OT;Supervision/Assistance - 24 hour    Equipment Recommendations  3 in 1 bedside commode    Recommendations for Other Services      Precautions / Restrictions Precautions Precautions: Fall;Other (comment) Restrictions Weight Bearing Restrictions: No       Mobility Bed Mobility Overal bed mobility: Modified Independent Bed Mobility: Supine to Sit;Sit to Supine(requires use of bed rails and HOB to 90 degrees )              Transfers Overall transfer level: Needs assistance   Transfers: Sit to/from Stand;Stand Pivot Transfers Sit to Stand: Supervision               Balance Overall balance assessment: Needs assistance   Sitting balance-Leahy Scale: Good       Standing balance-Leahy Scale: Fair                             ADL either performed or assessed with clinical judgement   ADL           Upper Body Bathing: Minimal assistance   Lower Body Bathing: Moderate assistance   Upper Body Dressing : Minimal assistance   Lower Body Dressing: Total assistance   Toilet Transfer: Minimal assistance   Toileting- Clothing Manipulation and Hygiene: Maximal assistance       Functional mobility during ADLs: Minimal assistance General ADL Comments: (Patient was noted with dyspnea and not able to don/doff sock)     Vision       Perception     Praxis      Cognition Arousal/Alertness: Awake/alert Behavior During Therapy: WFL for tasks assessed/performed                                   General Comments: Patient given word find puzzles to improve focusing and attention to task skills. Patient was able to search and find 2 words in 6 minutes.         Exercises     Shoulder Instructions       General Comments      Pertinent Vitals/ Pain       Pain Assessment: No/denies pain  Home  Living                                          Prior Functioning/Environment              Frequency           Progress Toward Goals  OT Goals(current goals can now be found in the care plan section)  Progress towards OT goals: Progressing toward goals  Acute Rehab OT Goals Patient Stated Goal: get better  Plan      Co-evaluation                 AM-PAC OT "6 Clicks" Daily Activity     Outcome Measure   Help from another person eating meals?: None Help from another person taking care of personal grooming?: A Little Help from another person toileting, which includes using toliet, bedpan, or urinal?: A Lot Help from another person bathing (including washing,  rinsing, drying)?: A Lot Help from another person to put on and taking off regular upper body clothing?: A Little Help from another person to put on and taking off regular lower body clothing?: A Lot 6 Click Score: 16    End of Session Equipment Utilized During Treatment: Oxygen      Activity Tolerance Patient tolerated treatment well   Patient Left in bed;with call bell/phone within reach;with bed alarm set   Nurse Communication Mobility status(O2 stats, refusal to sit in recliner)        Time: 1415-1500 OT Time Calculation (min): 45 min  Charges: OT General Charges $OT Visit: 1 Visit OT Treatments $Self Care/Home Management : 23-37 mins $Therapeutic Activity: 8-22 mins  Wylie Coon OTR/L    Samai Corea 09/05/2019, 3:25 PM

## 2019-09-05 NOTE — Plan of Care (Signed)
Pt Aox4. No complaints of pain. Pt refused to sit on the chair this morning. RN will try again this afternoon. RN convinced pt to do his breathing treatments with plenty of encouragement.   Wife, [name], was updated on the plan of care this morning and all questions were answered.   Problem: Education: Goal: Knowledge of risk factors and measures for prevention of condition will improve Outcome: Progressing   Problem: Coping: Goal: Psychosocial and spiritual needs will be supported Outcome: Progressing   Problem: Respiratory: Goal: Will maintain a patent airway Outcome: Progressing Goal: Complications related to the disease process, condition or treatment will be avoided or minimized Outcome: Progressing   Problem: Education: Goal: Knowledge of General Education information will improve Description: Including pain rating scale, medication(s)/side effects and non-pharmacologic comfort measures Outcome: Progressing   Problem: Health Behavior/Discharge Planning: Goal: Ability to manage health-related needs will improve Outcome: Progressing   Problem: Clinical Measurements: Goal: Ability to maintain clinical measurements within normal limits will improve Outcome: Progressing Goal: Will remain free from infection Outcome: Progressing Goal: Diagnostic test results will improve Outcome: Progressing Goal: Respiratory complications will improve Outcome: Progressing Goal: Cardiovascular complication will be avoided Outcome: Progressing   Problem: Activity: Goal: Risk for activity intolerance will decrease Outcome: Progressing   Problem: Nutrition: Goal: Adequate nutrition will be maintained Outcome: Progressing   Problem: Coping: Goal: Level of anxiety will decrease Outcome: Progressing   Problem: Elimination: Goal: Will not experience complications related to bowel motility Outcome: Progressing Goal: Will not experience complications related to urinary retention Outcome:  Progressing   Problem: Pain Managment: Goal: General experience of comfort will improve Outcome: Progressing   Problem: Safety: Goal: Ability to remain free from injury will improve Outcome: Progressing   Problem: Skin Integrity: Goal: Risk for impaired skin integrity will decrease Outcome: Progressing

## 2019-09-06 LAB — CBC
HCT: 32.8 % — ABNORMAL LOW (ref 39.0–52.0)
Hemoglobin: 9.8 g/dL — ABNORMAL LOW (ref 13.0–17.0)
MCH: 26.5 pg (ref 26.0–34.0)
MCHC: 29.9 g/dL — ABNORMAL LOW (ref 30.0–36.0)
MCV: 88.6 fL (ref 80.0–100.0)
Platelets: 163 10*3/uL (ref 150–400)
RBC: 3.7 MIL/uL — ABNORMAL LOW (ref 4.22–5.81)
RDW: 17.1 % — ABNORMAL HIGH (ref 11.5–15.5)
WBC: 15.1 10*3/uL — ABNORMAL HIGH (ref 4.0–10.5)
nRBC: 0 % (ref 0.0–0.2)

## 2019-09-06 LAB — BASIC METABOLIC PANEL
Anion gap: 8 (ref 5–15)
BUN: 102 mg/dL — ABNORMAL HIGH (ref 8–23)
CO2: 26 mmol/L (ref 22–32)
Calcium: 7.4 mg/dL — ABNORMAL LOW (ref 8.9–10.3)
Chloride: 106 mmol/L (ref 98–111)
Creatinine, Ser: 2.33 mg/dL — ABNORMAL HIGH (ref 0.61–1.24)
GFR calc Af Amer: 30 mL/min — ABNORMAL LOW (ref >60)
GFR calc non Af Amer: 26 mL/min — ABNORMAL LOW (ref >60)
Glucose, Bld: 145 mg/dL — ABNORMAL HIGH (ref 70–99)
Potassium: 5.9 mmol/L — ABNORMAL HIGH (ref 3.5–5.1)
Sodium: 140 mmol/L (ref 135–145)

## 2019-09-06 LAB — C-REACTIVE PROTEIN: CRP: 6.1 mg/dL — ABNORMAL HIGH (ref <1.0)

## 2019-09-06 LAB — GLUCOSE, CAPILLARY
Glucose-Capillary: 202 mg/dL — ABNORMAL HIGH (ref 70–99)
Glucose-Capillary: 214 mg/dL — ABNORMAL HIGH (ref 70–99)
Glucose-Capillary: 229 mg/dL — ABNORMAL HIGH (ref 70–99)
Glucose-Capillary: 309 mg/dL — ABNORMAL HIGH (ref 70–99)

## 2019-09-06 LAB — MAGNESIUM: Magnesium: 3 mg/dL — ABNORMAL HIGH (ref 1.7–2.4)

## 2019-09-06 LAB — FERRITIN: Ferritin: 1902 ng/mL — ABNORMAL HIGH (ref 24–336)

## 2019-09-06 MED ORDER — SODIUM ZIRCONIUM CYCLOSILICATE 10 G PO PACK
10.0000 g | PACK | Freq: Once | ORAL | Status: AC
  Administered 2019-09-06: 10 g via ORAL
  Filled 2019-09-06: qty 1

## 2019-09-06 MED ORDER — GABAPENTIN 100 MG PO CAPS
100.0000 mg | ORAL_CAPSULE | Freq: Two times a day (BID) | ORAL | Status: DC
  Administered 2019-09-06 – 2019-09-08 (×5): 100 mg via ORAL
  Filled 2019-09-06 (×5): qty 1

## 2019-09-06 NOTE — Progress Notes (Signed)
Hospitalist progress note   Patient from home, Patient going unclear, Dispo pending clinical improvement  Michael [name] [id number] DOB: [birth year] DOA: 08/29/2019  PCP: Cassandria Anger, MD   Narrative:  53 white male CKD 3, HTN, HFpEF, paroxysmal A. fib not on anticoagulation due to prior GI bleed CAD cath 2018 NSTEMI, psoriasis, small bowel AVM 2018 with coincidental hemorrhoidal etiology in the past Initially was admitted at [facility] however because of worsening oxygenation status transferred over to [place] evaluated the patient 2/1 and deferred colonoscopy given respiratory status and he is being treated empirically for hemorrhoidal bleed  Data Reviewed:  COVID-19 Labs  Recent Labs    09/04/19 0418 09/05/19 0256 09/06/19 0222  FERRITIN 1,555* 1,523* 1,902*  CRP 10.8* 9.6* 6.1*    Lab Results  Component Value Date   SARSCOV2NAA POSITIVE (A) 09/02/2019   [place] NEGATIVE 01/31/2019   [place] NEGATIVE 01/04/2019   Potassium 5.9 BUN/creatinine 102/2.33 magnesium 3.0  Assessment & Plan:  Multifocal coronavirus 19 pneumonia without improvement Remains on 15 L nonrebreather completed remdesivir 2/5 and continues on steroids since 2/1 Not a candidate for Actemra given being treated with Cosentyx for psoriasis Also on Lasix Coag negative staph bacteremia Evaluated earlier by infectious disease completed Ancef 08/31/2019 Prior history of AVM 2017,?  Hemorrhoidal bleeding on admission Hemoglobin has been stable-GI evaluated 2/1 and will follow as outpatient with no role at this time for colonoscopy Hyperkalemia Potassium 5.9-given Lokelma 2/9-we will repeat dose today We will hold lisinopril 5 at this time-repeat labs a.m.-consider discontinuation Fioricet and dose adjustment allopurinol Paroxysmal A. fib CHADS2 score >3 elevated has bled score Continue amiodarone 200, Cardizem 180, metoprolol 12.5 twice daily HFpEF- Meds as above periodic CXR DM TY  2 with neuropathy-A1c 5.7 n-sugars are currently elevated secondary to steroid use-CBG 140s to 200s eating about 50% of meals continue Lantus 25 and sliding scale while on steroids--opn 70 30 6 am and 6 pm at home Continue gabapentin Prior history of CAD Last cath 07/2016 PCI Synergy DES with triple therapy Currently quiesced sent meds as above History of psoriasis Previously on Cosentyx     Subjective: Nursing reports using IS sparingly He showed me how he does it and only got to about 300 Stool mis apparently "black and stinky" He is thirsty--stool is formed he states  Goes to the [facility] When I down tritrated his oxygen to 12 L he went to low 90's Consultants:   Non currently  Objective: Vitals:   09/05/19 2004 09/05/19 2300 09/06/19 0300 09/06/19 0741  BP:  (!) 146/54 (!) 136/54 (!) 140/49  Pulse: 65 62 60 70  Resp: (!) 22 18 16 20   Temp:  98 F (36.7 C) (!) 97.4 F (36.3 C) (!) 97.5 F (36.4 C)  TempSrc:  Axillary Axillary Axillary  SpO2: 91% 92% 93% 100%  Weight:      Height:        Intake/Output Summary (Last 24 hours) at 09/06/2019 0744 Last data filed at 09/05/2019 1400 Gross per 24 hour  Intake --  Output 300 ml  Net -300 ml   Filed Weights   09/03/2019 1830  Weight: 110.7 kg    Examination: Chronically ill appearing in nafd Some inc wob cta b noi adde dsound no rales no rhonchi abd soft nt nd no rebound Pain behin R claf He has a swollen atrea on Calf that looks like a Varicose vewin and a healed scar behind his calg  Scheduled Meds: .  allopurinol  200 mg Oral Daily  . amiodarone  200 mg Oral Once per day on Mon Tue Wed Thu Fri Sat  . vitamin C  500 mg Oral Daily  . atorvastatin  40 mg Oral Daily  . diltiazem  180 mg Oral Daily  . fenofibrate  160 mg Oral Daily  . ferrous sulfate  325 mg Oral Daily  . gabapentin  300 mg Oral TID  . guaiFENesin  600 mg Oral BID  . hydrALAZINE  50 mg Oral TID  . insulin aspart  0-5 Units Subcutaneous QHS  .  insulin aspart  0-9 Units Subcutaneous TID WC  . insulin aspart  6 Units Subcutaneous TID WC  . insulin glargine  28 Units Subcutaneous Daily  . Ipratropium-Albuterol  1 puff Inhalation Q6H  . levothyroxine  150 mcg Oral QAC breakfast  . methylPREDNISolone (SOLU-MEDROL) injection  40 mg Intravenous Q12H  . metoprolol tartrate  12.5 mg Oral BID  . sodium zirconium cyclosilicate  10 g Oral Once  . zinc sulfate  220 mg Oral Daily   Continuous Infusions:   LOS: 9 days   Time spent: [place], MD [facility]  09/06/2019, 7:44 AM

## 2019-09-06 NOTE — Progress Notes (Signed)
Pt wife updated and all questions answered.

## 2019-09-06 NOTE — Progress Notes (Signed)
Physical Therapy Treatment Patient Details Name: Michael [name] MRN: [id number] DOB: 1941/05/25 Today's Date: 09/06/2019    History of Present Illness 79 y.o. male with medical history significant of CKD stage 3b, paroxysmal A. fib, hypertension, obesity, coronary artery disease, chronic diastolic CHF, anemia, gout, dyspnea, DM, AVM who presents to the emergency department with complaints of cold-like symptoms, cough, fatigue, poor appetite. In the ED, Chest x-ray done in the emergency department showed multifocal pneumonia. Covid testing confirms COVID pna    PT Comments    Pt is agreeable to therapy, though appears lethargic and slurs speech throughout session. Pt able to perform bed exercises on 15 L/m HFNC with O2 sats dropping to 75% and recovering to 85% in 5-10 min following. Pt required min-modA with LE management in bed, elevating LE on 3 pillows at end of session due to LE edema present bilaterally. Pt continues to demonstrate reduced functional mobility not participating in standing due to SpO2 dropping to 75 % with seated exercises and taking 5-10 min to normalize back to rest levels. Due to reduction in mobility anticipate need for SNF vs CIR depending on qualifications upon discharge. Recommend speech follow up due to slurring present upon session.   Follow Up Recommendations  SNF;CIR(depending on medical stability and qualifying factors)     Equipment Recommendations  Rolling walker with 5" wheels;3in1 (PT)    Recommendations for Other Services Speech consult     Precautions / Restrictions Precautions Precautions: Fall;Other (comment) Precaution Comments: airborne Restrictions Weight Bearing Restrictions: No    Mobility  Bed Mobility Overal bed mobility: Modified Independent Bed Mobility: Supine to Sit;Sit to Supine Rolling: Modified independent (Device/Increase time)   Supine to sit: Supervision(For cord management)        Transfers                  General transfer comment: Pt refused to participate today stating it would be too painful.  Ambulation/Gait                 Stairs             Wheelchair Mobility    Modified Rankin (Stroke Patients Only)       Balance Overall balance assessment: Needs assistance Sitting-balance support: Feet supported;Single extremity supported Sitting balance-Leahy Scale: Fair   Postural control: Posterior lean                                  Cognition Arousal/Alertness: Lethargic Behavior During Therapy: Restless Overall Cognitive Status: Impaired/Different from baseline                                 General Comments: Pt demonstrates good focus on MMA on television and held a conversation about what he saw a couple of weeks ago, but states he does not know where he is when asked.      Exercises General Exercises - Lower Extremity Ankle Circles/Pumps: AROM;Both;20 reps;Seated Long Arc Quad: AROM;Both;20 reps;Seated(Pt demonstrated LOB during, requiring ModA for correction) Hip Flexion/Marching: AROM;20 reps;Seated    General Comments General comments (skin integrity, edema, etc.): Pt demonstrated increased LE edema today bilaterally. 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       Pertinent Vitals/Pain Pain Assessment: 0-10(Pt states has pain in back of  legs if ) Pain Score: 0-No pain    Home Living                      Prior Function            PT Goals (current goals can now be found in the care plan section) Acute Rehab PT Goals Patient Stated Goal: get better PT Goal Formulation: With patient Time For Goal Achievement: 09/15/19 Potential to Achieve Goals: Fair Progress towards PT goals: Not progressing toward goals - comment(Reduced functional mobility)    Frequency    Min 2X/week      PT Plan Discharge plan needs to be updated     Co-evaluation              AM-PAC PT "6 Clicks" Mobility   Outcome Measure  Help needed turning from your back to your side while in a flat bed without using bedrails?: A Little Help needed moving from lying on your back to sitting on the side of a flat bed without using bedrails?: A Little Help needed moving to and from a bed to a chair (including a wheelchair)?: A Lot Help needed standing up from a chair using your arms (e.g., wheelchair or bedside chair)?: A Lot Help needed to walk in hospital room?: A Lot Help needed climbing 3-5 steps with a railing? : Total 6 Click Score: 13    End of Session Equipment Utilized During Treatment: Oxygen Activity Tolerance: Patient limited by fatigue;Patient limited by pain;Patient limited by lethargy Patient left: in bed;with call bell/phone within reach Nurse Communication: Mobility status PT Visit Diagnosis: Unsteadiness on feet (R26.81);Other abnormalities of gait and mobility (R26.89)     Time: 2924-4628 PT Time Calculation (min) (ACUTE ONLY): 25 min  Charges:  $Therapeutic Exercise: 8-22 mins $Therapeutic Activity: 8-22 mins                     Ann Held PT, DPT Acute [place] P: [phone]   Roman Dubuc A Camry Theiss 09/06/2019, 5:00 PM

## 2019-09-06 NOTE — Plan of Care (Addendum)
Patient is AO x 4. Neuro Assessment: Pupils are 69mm, brisk and reactive to light. EOMI intact. Cough present and strong. Sensation intact. no signs of focal weakness. Speech is slurred but baseline for patient.  Cardiac Assessment: Heart sounds normal. Pulses are 2+ in UE and 2+ in LE. MAP/BP goal maintained. Heart rhythm is Afib. No complain of chest pain or dizziness during shift. Respiratory Assessment: Lung sounds are diminished. Patient is currently on 15L HFNC. Patient maintain O2 saturation during shift between 89 and 98%. No complaint of SOB during shift except during activity. Gastro/Urinary Assessment: Patient is voiding in external urinary device. Active bowel sounds. One medium BM overnight. Skin Assessment: Nothing new to note. Skin protocols maintained overnight.  Nursing Recommendation: ween O2. Patient is unable to bare weight. Patient quickly desaturates with exertion.  Will continue to monitor.     Problem: Education: Goal: Knowledge of risk factors and measures for prevention of condition will improve Outcome: Progressing   Problem: Coping: Goal: Psychosocial and spiritual needs will be supported Outcome: Progressing   Problem: Respiratory: Goal: Will maintain a patent airway Outcome: Progressing Goal: Complications related to the disease process, condition or treatment will be avoided or minimized Outcome: Progressing   Problem: Education: Goal: Knowledge of General Education information will improve Description: Including pain rating scale, medication(s)/side effects and non-pharmacologic comfort measures Outcome: Progressing   Problem: Health Behavior/Discharge Planning: Goal: Ability to manage health-related needs will improve Outcome: Progressing   Problem: Clinical Measurements: Goal: Ability to maintain clinical measurements within normal limits will improve Outcome: Progressing Goal: Will remain free from infection Outcome: Progressing Goal: Diagnostic test  results will improve Outcome: Progressing Goal: Respiratory complications will improve Outcome: Progressing Goal: Cardiovascular complication will be avoided Outcome: Progressing   Problem: Activity: Goal: Risk for activity intolerance will decrease Outcome: Progressing   Problem: Nutrition: Goal: Adequate nutrition will be maintained Outcome: Progressing   Problem: Coping: Goal: Level of anxiety will decrease Outcome: Progressing   Problem: Elimination: Goal: Will not experience complications related to bowel motility Outcome: Progressing Goal: Will not experience complications related to urinary retention Outcome: Progressing   Problem: Pain Managment: Goal: General experience of comfort will improve Outcome: Progressing   Problem: Safety: Goal: Ability to remain free from injury will improve Outcome: Progressing   Problem: Skin Integrity: Goal: Risk for impaired skin integrity will decrease Outcome: Progressing

## 2019-09-07 ENCOUNTER — Inpatient Hospital Stay (HOSPITAL_COMMUNITY): Payer: HMO

## 2019-09-07 LAB — CBC
HCT: 32.1 % — ABNORMAL LOW (ref 39.0–52.0)
Hemoglobin: 9.8 g/dL — ABNORMAL LOW (ref 13.0–17.0)
MCH: 26.8 pg (ref 26.0–34.0)
MCHC: 30.5 g/dL (ref 30.0–36.0)
MCV: 87.7 fL (ref 80.0–100.0)
Platelets: 145 10*3/uL — ABNORMAL LOW (ref 150–400)
RBC: 3.66 MIL/uL — ABNORMAL LOW (ref 4.22–5.81)
RDW: 17.2 % — ABNORMAL HIGH (ref 11.5–15.5)
WBC: 15.7 10*3/uL — ABNORMAL HIGH (ref 4.0–10.5)
nRBC: 0 % (ref 0.0–0.2)

## 2019-09-07 LAB — FERRITIN: Ferritin: 1741 ng/mL — ABNORMAL HIGH (ref 24–336)

## 2019-09-07 LAB — BASIC METABOLIC PANEL
Anion gap: 10 (ref 5–15)
BUN: 113 mg/dL — ABNORMAL HIGH (ref 8–23)
CO2: 24 mmol/L (ref 22–32)
Calcium: 7.2 mg/dL — ABNORMAL LOW (ref 8.9–10.3)
Chloride: 104 mmol/L (ref 98–111)
Creatinine, Ser: 2.37 mg/dL — ABNORMAL HIGH (ref 0.61–1.24)
GFR calc Af Amer: 30 mL/min — ABNORMAL LOW (ref >60)
GFR calc non Af Amer: 26 mL/min — ABNORMAL LOW (ref >60)
Glucose, Bld: 236 mg/dL — ABNORMAL HIGH (ref 70–99)
Potassium: 6.1 mmol/L — ABNORMAL HIGH (ref 3.5–5.1)
Sodium: 138 mmol/L (ref 135–145)

## 2019-09-07 LAB — C-REACTIVE PROTEIN: CRP: 3.5 mg/dL — ABNORMAL HIGH (ref <1.0)

## 2019-09-07 LAB — GLUCOSE, CAPILLARY
Glucose-Capillary: 231 mg/dL — ABNORMAL HIGH (ref 70–99)
Glucose-Capillary: 265 mg/dL — ABNORMAL HIGH (ref 70–99)
Glucose-Capillary: 224 mg/dL — ABNORMAL HIGH (ref 70–99)
Glucose-Capillary: 299 mg/dL — ABNORMAL HIGH (ref 70–99)

## 2019-09-07 LAB — MAGNESIUM: Magnesium: 3.4 mg/dL — ABNORMAL HIGH (ref 1.7–2.4)

## 2019-09-07 MED ORDER — SODIUM ZIRCONIUM CYCLOSILICATE 10 G PO PACK
10.0000 g | PACK | Freq: Two times a day (BID) | ORAL | Status: DC
  Administered 2019-09-07 – 2019-09-09 (×5): 10 g via ORAL
  Filled 2019-09-07 (×7): qty 1

## 2019-09-07 MED ORDER — HEPARIN SODIUM (PORCINE) 10000 UNIT/ML IJ SOLN
7500.0000 [IU] | Freq: Three times a day (TID) | INTRAMUSCULAR | Status: DC
  Administered 2019-09-07 – 2019-09-08 (×3): 7500 [IU] via SUBCUTANEOUS
  Filled 2019-09-07 (×3): qty 1

## 2019-09-07 MED ORDER — SODIUM CHLORIDE 0.9 % IV SOLN: INTRAVENOUS | Status: DC

## 2019-09-07 NOTE — Progress Notes (Signed)
Occupational therapy Note  Clinical Impression: Patient has demonstrated progress with agreeing to sit in recliner, but required Moderate Assist x 2 for squat pivot. Patient was not able to maintain proper standing balance with A x 2 using RW for a stand pivot transfer (2 attempts) . Patient required the use of non rebreather (15L) and 18L HFNC for functional mobility with stats in low 90s. Patient was able to recover while sitting in recliner after about 5 minutes with initial drop to high O2 stats 80s and was able to self-feed on 15L with O2 stats in 90s.  Patient was able to complete self-feeding while sitting in recliner with [place]. Patient will benefit from acute OT services.    09/07/19 1200  OT Visit Information  Assistance Needed +2  History of Present Illness 79 y.o. male with medical history significant of CKD stage 3b, paroxysmal A. fib, hypertension, obesity, coronary artery disease, chronic diastolic CHF, anemia, gout, dyspnea, DM, AVM who presents to the emergency department with complaints of cold-like symptoms, cough, fatigue, poor appetite. In the ED, Chest x-ray done in the emergency department showed multifocal pneumonia. Covid testing confirms COVID pna  Precautions  Precautions Fall;Other (comment)  Precaution Comments airborne  Pain Assessment  Pain Assessment No/denies pain  Cognition  Arousal/Alertness Awake/alert  Behavior During Therapy Anxious  Overall Cognitive Status Impaired/Different from baseline  Area of Impairment Attention;Memory;Problem solving  ADL  Eating/Feeding Independent;Sitting  Grooming Wash/dry face;Wash/dry hands;Set up  Functional mobility during ADLs Moderate assistance;+2 for physical assistance  Bed Mobility  Overal bed mobility Needs Assistance  Supine to sit Min assist  Balance  Sitting balance-Leahy Scale Good  Standing balance-Leahy Scale Poor  Restrictions  Weight Bearing Restrictions No  Transfers  Overall transfer level  Needs assistance  Stand pivot transfers Mod assist;+2 physical assistance  Exercises  Exercises Other exercises  Other Exercises  Other Exercises flutter valve x 10  Other Exercises incentive spirometer x 5  OT - End of Session  Equipment Utilized During Treatment Oxygen;Gait belt  Activity Tolerance Patient tolerated treatment well  Patient left in chair;with call bell/phone within reach;with chair alarm set  Nurse Communication Mobility status;Other (comment) (OOB sitting tolerance )  OT Assessment/Plan  Follow Up Recommendations SNF;CIR  OT Equipment 3 in 1 bedside commode  AM-PAC OT "6 Clicks" Daily Activity Outcome Measure (Version 2)  Help from another person eating meals? 4  Help from another person taking care of personal grooming? 3  Help from another person toileting, which includes using toliet, bedpan, or urinal? 2  Help from another person bathing (including washing, rinsing, drying)? 2  Help from another person to put on and taking off regular upper body clothing? 3  Help from another person to put on and taking off regular lower body clothing? 2  6 Click Score 16  OT Goal Progression  Progress towards OT goals Progressing toward goals  OT Time Calculation  OT Start Time (ACUTE ONLY) 1137  OT Stop Time (ACUTE ONLY) 1217  OT Time Calculation (min) 40 min  OT General Charges  $OT Visit 1 Visit  OT Treatments  $Self Care/Home Management  8-22 mins  $Therapeutic Activity 8-22 mins  $Therapeutic Exercise 8-22 mins   Navpreet Szczygiel OTR/L

## 2019-09-07 NOTE — Progress Notes (Signed)
Inpatient Diabetes Program Recommendations  AACE/ADA: New Consensus Statement on Inpatient Glycemic Control (2015)  Target Ranges:  Prepandial:   less than 140 mg/dL      Peak postprandial:   less than 180 mg/dL (1-2 hours)      Critically ill patients:  140 - 180 mg/dL   Lab Results  Component Value Date   GLUCAP 309 (H) 09/06/2019   HGBA1C 5.7 (H) 09/18/2019    Review of Glycemic Control Results for KEYRON, POKORSKI (MRN [id number]) as of 09/07/2019 11:33  Ref. Range 09/06/2019 12:07 09/06/2019 16:11 09/06/2019 19:59  Glucose-Capillary Latest Ref Range: 70 - 99 mg/dL 214 (H) 229 (H) 309 (H)   Outpatient Diabetes medications: Novolin 70/30 50 units BID Current orders for Inpatient glycemic control: Lantus 28 units QD, Novolog 6 units TID, Novolog 0-9 units TID, Novolog 0-5 units QHS Solumedrol 40 mg BID  Inpatient Diabetes Program Recommendations:    Consider increasing Lantus to 32 units QD.   Thanks, Bronson Curb, MSN, RNC-OB Diabetes Coordinator [phone] (8a-5p)

## 2019-09-07 NOTE — Progress Notes (Addendum)
TOC team continues to follow for assistance with discharge planning once patient medically improves. CSW notes SNF reccomendation per PT. CSW acknowledges patient continues to be on 15 L HFNC. Not medically appropriate for SNF, would need to wean O2.   [place], [place]

## 2019-09-07 NOTE — Progress Notes (Signed)
ANTICOAGULATION CONSULT NOTE - Initial Consult  Pharmacy Consult for heparin Indication: VTE prophylaxis  Allergies  Allergen Reactions  . Percocet [Oxycodone-Acetaminophen] Nausea And Vomiting  . Ciprofloxacin Other (See Comments)    Light headed and made stagger  . Metformin And Related Nausea Only    Upset stomach  . Invokana [Canagliflozin] Rash    Patient Measurements: Height: 5' 11"  (180.3 cm) Weight: 244 lb 0.8 oz (110.7 kg) IBW/kg (Calculated) : 75.3 BMI: 34.05 kg/m2  Vital Signs: Temp: 96.8 F (36 C) (02/11 1119) Temp Source: Axillary (02/11 1119) BP: 148/51 (02/11 1535) Pulse Rate: 59 (02/11 1200)  Labs: Recent Labs    09/05/19 0256 09/05/19 0256 09/06/19 0222 09/07/19 0208  HGB 9.8*   < > 9.8* 9.8*  HCT 32.1*  --  32.8* 32.1*  PLT 188  --  163 145*  CREATININE 2.34*  --  2.33* 2.37*   < > = values in this interval not displayed.    Estimated Creatinine Clearance: 33.6 mL/min (A) (by C-G formula based on SCr of 2.37 mg/dL (H)).   Medical History: Past Medical History:  Diagnosis Date  . Acute on chronic diastolic (congestive) heart failure ([place]) 02/05/2017  . Arthritis   . AVM (arteriovenous malformation) of colon   . AVM (arteriovenous malformation) of small bowel, acquired with hemorrhage 01/20/2012   Found on small bowel capsule endoscopy, June 2013   . Carotid artery disease ([place])    a. Carotid dopple 03/2014 35-00% RICA &  93-81% LICA stenosis b. 8/29  . CKD (chronic kidney disease), stage III   . Clostridium difficile colitis 05/13/2016  . Coronary artery disease involving native coronary artery of native heart without angina pectoris 07/31/2016   Lopressor - d/c 7/19, Lipitor and ASA  . GERD (gastroesophageal reflux disease)   . Gout   . H/O transfusion of whole blood   . Hx of adenomatous colonic polyps 07/02/2016  . Hyperlipidemia   . Hypertension   . Hypothyroidism   . Iron deficiency anemia   . Nonrheumatic aortic valve stenosis   .  NSTEMI (non-ST elevated myocardial infarction) ([place])    hx/notes 06/25/2017  . PAD (peripheral artery disease) ([place]) 07/31/2016   On ASA, Plavix  . Personal history of colonic polyps 2007, 2008   adenoma each time, largest 12 mm in 2007  . Plaque psoriasis   . Psoriasis   . Septic phlebitis of upper extremity 08/26/2016   2018 L hand Dr Megan Salon f/u Keflex and Vanc po  . Staph infection 07/2016   left hand   . Subclavian artery stenosis, right ([place]) 01/16/2015  . Type II diabetes mellitus (HCC)    TYPE 2    Medications:  Medications Prior to Admission  Medication Sig Dispense Refill Last Dose  . allopurinol (ZYLOPRIM) 100 MG tablet Take 2 tablets (266m total) by mouth daily (Patient taking differently: Take 200 mg by mouth daily. Take 2 tablets (2086mtotal) by mouth daily) 60 tablet 5 09/12/2019 at Unknown time  . amiodarone (PACERONE) 200 MG tablet TAKE 1 TABLET BY MOUTH ONCE DAILY EXCEPT  FOR  SUNDAY (Patient taking differently: Take 200 mg by mouth See admin instructions. Take daily EXCEPT: Sunday) 90 tablet 3 09/14/2019 at Unknown time  . aspirin 81 MG chewable tablet Chew 1 tablet (81 mg total) by mouth daily. 90 tablet 1 09/13/2019 at Unknown time  . atorvastatin (LIPITOR) 40 MG tablet Take 1 tablet (40 mg total) by mouth daily. (Patient taking differently: Take 40 mg by  mouth daily at 6 PM. ) 90 tablet 2 08/27/2019 at Unknown time  . b complex vitamins tablet Take 1 tablet by mouth daily. 100 tablet 3 08/27/2019 at Unknown time  . CARTIA XT 180 MG 24 hr capsule Take 1 capsule by mouth once daily (Patient taking differently: Take 180 mg by mouth daily. ) 90 capsule 2 08/30/2019 at 0700  . Cholecalciferol (VITAMIN D-3) 1000 units CAPS Take 1,000-5,000 Units by mouth See admin instructions. 1,000 units once a day on Sun/Mon/Tues/Wed/Thurs/Fri and 5,000 units on Sat   09/01/2019 at Unknown time  . fenofibrate 160 MG tablet Take 1 tablet by mouth daily (Patient taking differently: Take 160 mg by mouth  daily. ) 30 tablet 9 09/07/2019 at Unknown time  . ferrous sulfate 325 (65 FE) MG tablet Take 325 mg by mouth 3 (three) times daily with meals.   09/02/2019 at Unknown time  . furosemide (LASIX) 40 MG tablet Take 1 tablet (40 mg total) by mouth 2 (two) times daily. 180 tablet 2 09/24/2019 at Unknown time  . gabapentin (NEURONTIN) 300 MG capsule Take 1 capsule (300 mg total) by mouth 3 (three) times daily. 270 capsule 3 09/24/2019 at Unknown time  . halobetasol (ULTRAVATE) 0.05 % cream Apply 1 application topically 2 (two) times daily.   09/23/2019 at Unknown time  . hydrALAZINE (APRESOLINE) 50 MG tablet Take 1 tablet (50 mg total) by mouth 3 (three) times daily. 90 tablet 11 09/02/2019 at Unknown time  . Insulin Isophane & Regular Human (NOVOLIN 70/30 FLEXPEN RELION) (70-30) 100 UNIT/ML PEN Inject 50 Units into the skin 2 (two) times daily with a meal. (Patient taking differently: Inject 50 Units into the skin 2 (two) times daily. ) 30 mL 11 08/27/2019 at Unknown time  . levothyroxine (SYNTHROID) 150 MCG tablet Take 1 tablet (150 mcg total) by mouth daily before breakfast. 90 tablet 3 09/07/2019 at 0700  . lisinopril (ZESTRIL) 5 MG tablet Take 5 mg by mouth at bedtime.   09/09/2019 at Unknown time  . metoprolol tartrate (LOPRESSOR) 25 MG tablet Take 0.5 tablets (12.5 mg total) by mouth 2 (two) times daily. 30 tablet 6 09/21/2019 at 0700  . nitroGLYCERIN (NITROSTAT) 0.4 MG SL tablet Place 1 tablet under tongue for chest pain. Repeat every 5 minutes as needed, up to 3 doses. If no relief after 3 doses, call 911. (Patient taking differently: Place 0.4 mg under the tongue every 5 (five) minutes as needed for chest pain. ) 25 tablet 11 unknown at prn  . Secukinumab (COSENTYX) 150 MG/ML SOSY Inject 150 mg into the skin every 30 (thirty) days. Inject 368m (2 pens) subcutaneously every 4 weeks    Past Month at Unknown time  . Blood Glucose Monitoring Suppl (ONE TOUCH ULTRA 2) W/DEVICE KIT Use as directed Dx E11.9 1 each 0   .  Blood Pressure Monitor KIT Use to check blood pressure daily Dx I10 1 each 0   . glucose blood (ONETOUCH ULTRA) test strip CHECK BLOOD GLUCOSE (SUGAR) 4 TIMES DAILY DX: E11.9, Z79.4 (pt on insulin) 400 each 3   . Insulin Pen Needle 32G X 4 MM MISC Use to administer insulin 100 each 11   . Lancets (ONETOUCH ULTRASOFT) lancets Use to check blood sugars daily 30 each 11   . NEEDLE, DISP, 30 G (BD DISP NEEDLES) 30G X 1/2" MISC USE WITH 1 SYRINGE SUBCUTANEOUSLY  TWICE DAILY 100 each 11    Scheduled:  . allopurinol  200 mg Oral Daily  .  amiodarone  200 mg Oral Once per day on Mon Tue Wed Thu Fri Sat  . vitamin C  500 mg Oral Daily  . atorvastatin  40 mg Oral Daily  . diltiazem  180 mg Oral Daily  . fenofibrate  160 mg Oral Daily  . ferrous sulfate  325 mg Oral Daily  . gabapentin  100 mg Oral BID  . guaiFENesin  600 mg Oral BID  . heparin injection (subcutaneous)  7,500 Units Subcutaneous Q8H  . hydrALAZINE  50 mg Oral TID  . insulin aspart  0-5 Units Subcutaneous QHS  . insulin aspart  0-9 Units Subcutaneous TID WC  . insulin aspart  6 Units Subcutaneous TID WC  . insulin glargine  28 Units Subcutaneous Daily  . Ipratropium-Albuterol  1 puff Inhalation Q6H  . levothyroxine  150 mcg Oral QAC breakfast  . methylPREDNISolone (SOLU-MEDROL) injection  40 mg Intravenous Q12H  . metoprolol tartrate  12.5 mg Oral BID  . sodium zirconium cyclosilicate  10 g Oral BID  . zinc sulfate  220 mg Oral Daily    Assessment: Pharmacy consulted to dose heparin for intermediate VTE prophylaxis by [place].   History includes CKD 3, HTN, HFpEF, paroxysmal A. fib previously not on anticoagulation due to prior GI bleed, CAD cath 2018 NSTEMI, psoriasis, small bowel AVM 2018 with coincidental hemorrhoidal etiology in the past. Initially was admitted at [facility] however because of worsening oxygenation status transferred over to [facility]. GI evaluated the patient 2/1 and deferred colonoscopy given  respiratory status and treated empirically for hemorrhoidal bleed. HGB low but stable Platelets have trended down since admission and are currently greater than 100 K/ul  Goal of Therapy:  Intermediate VTE prophylaxis Monitor platelets by anticoagulation protocol: Yes   Plan:  Initate heparin 7500 units subq q8h Monitor daily CBC, s/s bleeding  Efraim Kaufmann, PharmD, BCPS 09/07/2019,3:39 PM

## 2019-09-07 NOTE — Plan of Care (Signed)

## 2019-09-07 NOTE — Progress Notes (Signed)
Hospitalist progress note   Patient from home, Patient going unclear, Dispo pending clinical improvement  Michael [name] [id number] DOB: 1941-06-23 DOA: 08/30/2019  PCP: Cassandria Anger, MD   Narrative:  44 white male CKD 3, HTN, HFpEF, paroxysmal A. fib not on anticoagulation due to prior GI bleed CAD cath 2018 NSTEMI, psoriasis, small bowel AVM 2018 with coincidental hemorrhoidal etiology in the past Initially was admitted at [facility] however because of worsening oxygenation status transferred over to [place] evaluated the patient 2/1 and deferred colonoscopy given respiratory status and he is being treated empirically for hemorrhoidal bleed  Data Reviewed:  COVID-19 Labs  Recent Labs    09/05/19 0256 09/06/19 0222 09/07/19 0208  FERRITIN 1,523* 1,902* 1,741*  CRP 9.6* 6.1* 3.5*    Lab Results  Component Value Date   SARSCOV2NAA POSITIVE (A) 09/08/2019   SARSCOV2NAA NEGATIVE 01/31/2019   [place] NEGATIVE 01/04/2019   K 5.9--->6.1, BUn/Creat 113/2.37, Calcium 7.2 WBC 15.7 Hb 9.8 PLT 145 CXR personally reviewed shows less count of intercostal spaces compared to 2/8  Assessment & Plan:  Multifocal coronavirus 19 pneumonia without improvement Remains on 15 L nonrebreather completed remdesivir 2/5 and continues on steroids since 2/1 Not a candidate for Actemra given being treated with Cosentyx for psoriasis Also on Lasix earlier in admission-CXR this a.m. suggestive of clearing confluences? Fluid  WBC probably elevated 2/2 steroid use, pro calcitonin is low so low suspicion for superimposed infection at 0.47--continue to monitor Coag negative staph bacteremia Evaluated earlier by infectious disease completed Ancef 08/31/2019 Prior history of AVM 2017,?  Hemorrhoidal bleeding on admission Hemoglobin has been stable-GI evaluated 2/1 and will follow as outpatient with no role at this time for colonoscopy Hyperkalemia AKI superimposed on CKD 3 A.  persistently elevated potassium-schedule Lokelma 10 mg twice daily-if does not resolve will need to discuss with nephrology further work-up/planning B. Minimize nephrotoxins and discontinue lisinopril as of 2/10 --starting IVf at 50 cc/h and monitor trends carefully--AM CXR and if not clear, will get CT scan chest without contrast to evaluate furhter Paroxysmal A. fib CHADS2 score >3 elevated has bled score Continue amiodarone 200, Cardizem 180, metoprolol 12.5 twice daily HFpEF- Meds as above periodic CXR We will get strict ins and outs-he appears to be at -1.1 L for admission DM TY 2 with neuropathy-A1c 5.7 CBG range 236-309 Continue Lantus 25 and sliding scale while on steroids--opn 70 30 6 am and 6 pm at home Continue gabapentin but have lower dose given renal dysfunction Prior history of CAD Last cath 07/2016 PCI Synergy DES with triple therapy Currently quiesced sent meds as above History of psoriasis Previously on Cosentyx  Called wife [phone] and updated   Subjective:  s a little bit more awake than previous No chest pain No vomiting Continues on high flow with nonrebreather superimposed  Consultants:   Non currently  Objective: Vitals:   09/06/19 1908 09/06/19 2300 09/07/19 0300 09/07/19 0705  BP: (!) 142/53 (!) 140/53 (!) 147/60 (!) 135/55  Pulse: 67 (!) 58 (!) 57 70  Resp: 20 14 18 17   Temp: 97.9 F (36.6 C) (!) 97 F (36.1 C) 97.6 F (36.4 C) 97.7 F (36.5 C)  TempSrc: Axillary Axillary Axillary Oral  SpO2: 92% 93% 97% 95%  Weight:      Height:        Intake/Output Summary (Last 24 hours) at 09/07/2019 0710 Last data filed at 09/07/2019 0600 Gross per 24 hour  Intake 240 ml  Output 850 ml  Net -610 ml   Filed Weights   08/29/2019 1830  Weight: 110.7 kg    Examination:  Awake slightly coherent no distress EOMI NCAT no focal deficit S1-S2 no murmur Chest is relatively clear without added sound no rales no rhonchi decreased air entry Abdomen is  soft nontender Lower extremities have mild edema Moving all 4 limbs equally    Scheduled Meds: . allopurinol  200 mg Oral Daily  . amiodarone  200 mg Oral Once per day on Mon Tue Wed Thu Fri Sat  . vitamin C  500 mg Oral Daily  . atorvastatin  40 mg Oral Daily  . diltiazem  180 mg Oral Daily  . fenofibrate  160 mg Oral Daily  . ferrous sulfate  325 mg Oral Daily  . gabapentin  100 mg Oral BID  . guaiFENesin  600 mg Oral BID  . hydrALAZINE  50 mg Oral TID  . insulin aspart  0-5 Units Subcutaneous QHS  . insulin aspart  0-9 Units Subcutaneous TID WC  . insulin aspart  6 Units Subcutaneous TID WC  . insulin glargine  28 Units Subcutaneous Daily  . Ipratropium-Albuterol  1 puff Inhalation Q6H  . levothyroxine  150 mcg Oral QAC breakfast  . methylPREDNISolone (SOLU-MEDROL) injection  40 mg Intravenous Q12H  . metoprolol tartrate  12.5 mg Oral BID  . sodium zirconium cyclosilicate  10 g Oral BID  . zinc sulfate  220 mg Oral Daily   Continuous Infusions:   LOS: 10 days   Time spent: [place], MD [facility]  09/07/2019, 7:10 AM

## 2019-09-07 NOTE — Progress Notes (Signed)
Rehab Admissions Coordinator Note:  Per OT recommendation this patient was screened by Raechel Ache for appropriateness for an Inpatient Acute Rehab Consult.  At this time, the pt does not demonstrate an ability to tolerate 3 hours of therapy a day. If pt's oxygen requirements lessen and he continues to agree to out of bed mobility, pt may eventually become a candidate for CIR. Will not pursue consult order at this time but will follow at a distance for improvement.   *noted pt's initial positive COVID test was on 2/1. Per protocol, pt will need to be >20 days post positive COVID 19 test or have two negative tests prior to admit to CIR.   Raechel Ache, OTR/L  Rehab Admissions Coordinator  [phone] 09/07/2019 5:54 PM   Raechel Ache 09/07/2019, 5:45 PM  I can be reached at [phone].

## 2019-09-08 ENCOUNTER — Inpatient Hospital Stay (HOSPITAL_COMMUNITY): Payer: HMO

## 2019-09-08 DIAGNOSIS — R0602 Shortness of breath: Secondary | ICD-10-CM

## 2019-09-08 LAB — BASIC METABOLIC PANEL
Anion gap: 8 (ref 5–15)
BUN: 124 mg/dL — ABNORMAL HIGH (ref 8–23)
CO2: 25 mmol/L (ref 22–32)
Calcium: 7.1 mg/dL — ABNORMAL LOW (ref 8.9–10.3)
Chloride: 105 mmol/L (ref 98–111)
Creatinine, Ser: 2.49 mg/dL — ABNORMAL HIGH (ref 0.61–1.24)
GFR calc Af Amer: 28 mL/min — ABNORMAL LOW (ref >60)
GFR calc non Af Amer: 24 mL/min — ABNORMAL LOW (ref >60)
Glucose, Bld: 240 mg/dL — ABNORMAL HIGH (ref 70–99)
Potassium: 5.7 mmol/L — ABNORMAL HIGH (ref 3.5–5.1)
Sodium: 138 mmol/L (ref 135–145)

## 2019-09-08 LAB — CBC
HCT: 31.1 % — ABNORMAL LOW (ref 39.0–52.0)
Hemoglobin: 9.3 g/dL — ABNORMAL LOW (ref 13.0–17.0)
MCH: 26.7 pg (ref 26.0–34.0)
MCHC: 29.9 g/dL — ABNORMAL LOW (ref 30.0–36.0)
MCV: 89.4 fL (ref 80.0–100.0)
Platelets: 134 10*3/uL — ABNORMAL LOW (ref 150–400)
RBC: 3.48 MIL/uL — ABNORMAL LOW (ref 4.22–5.81)
RDW: 17.2 % — ABNORMAL HIGH (ref 11.5–15.5)
WBC: 15.2 10*3/uL — ABNORMAL HIGH (ref 4.0–10.5)
nRBC: 0 % (ref 0.0–0.2)

## 2019-09-08 LAB — D-DIMER, QUANTITATIVE: D-Dimer, Quant: 2.2 ug/mL-FEU — ABNORMAL HIGH (ref 0.00–0.50)

## 2019-09-08 LAB — GLUCOSE, CAPILLARY
Glucose-Capillary: 125 mg/dL — ABNORMAL HIGH (ref 70–99)
Glucose-Capillary: 151 mg/dL — ABNORMAL HIGH (ref 70–99)
Glucose-Capillary: 193 mg/dL — ABNORMAL HIGH (ref 70–99)
Glucose-Capillary: 159 mg/dL — ABNORMAL HIGH (ref 70–99)

## 2019-09-08 LAB — CULTURE, BLOOD (ROUTINE X 2)
Culture: NO GROWTH
Culture: NO GROWTH
Special Requests: ADEQUATE
Special Requests: ADEQUATE

## 2019-09-08 LAB — C-REACTIVE PROTEIN: CRP: 1.7 mg/dL — ABNORMAL HIGH (ref <1.0)

## 2019-09-08 LAB — FERRITIN: Ferritin: 1543 ng/mL — ABNORMAL HIGH (ref 24–336)

## 2019-09-08 MED ORDER — ONDANSETRON HCL 4 MG PO TABS
4.0000 mg | ORAL_TABLET | Freq: Three times a day (TID) | ORAL | Status: DC | PRN
  Administered 2019-09-08: 08:00:00 4 mg via ORAL
  Filled 2019-09-08: qty 1

## 2019-09-08 MED ORDER — TRAZODONE HCL 50 MG PO TABS
50.0000 mg | ORAL_TABLET | Freq: Every evening | ORAL | Status: AC | PRN
  Administered 2019-09-08: 23:00:00 50 mg via ORAL
  Filled 2019-09-08: qty 1

## 2019-09-08 MED ORDER — INSULIN GLARGINE 100 UNIT/ML ~~LOC~~ SOLN
32.0000 [IU] | Freq: Every day | SUBCUTANEOUS | Status: DC
  Administered 2019-09-08 – 2019-09-09 (×2): 32 [IU] via SUBCUTANEOUS
  Filled 2019-09-08 (×3): qty 0.32

## 2019-09-08 MED ORDER — HEPARIN (PORCINE) 25000 UT/250ML-% IV SOLN
1550.0000 [IU]/h | INTRAVENOUS | Status: DC
  Administered 2019-09-08 – 2019-09-10 (×3): 1550 [IU]/h via INTRAVENOUS
  Filled 2019-09-08 (×5): qty 250

## 2019-09-08 MED ORDER — HEPARIN BOLUS VIA INFUSION
4000.0000 [IU] | Freq: Once | INTRAVENOUS | Status: AC
  Administered 2019-09-08: 4000 [IU] via INTRAVENOUS
  Filled 2019-09-08: qty 4000

## 2019-09-08 NOTE — Progress Notes (Addendum)
ANTICOAGULATION CONSULT NOTE - Initial Consult  Pharmacy Consult for IV Heparin  Indication:  VTE treatment + RLE DVT  Allergies  Allergen Reactions  . Percocet [Oxycodone-Acetaminophen] Nausea And Vomiting  . Ciprofloxacin Other (See Comments)    Light headed and made stagger  . Metformin And Related Nausea Only    Upset stomach  . Invokana [Canagliflozin] Rash    Patient Measurements: Height: 5\' 11"  (180.3 cm) Weight: 244 lb 0.8 oz (110.7 kg) IBW/kg (Calculated) : 75.3 Heparin Dosing Weight:  99 kg  Vital Signs: Temp: 97.9 F (36.6 C) (02/12 1131) Temp Source: Oral (02/12 1131) BP: 144/54 (02/12 1131) Pulse Rate: 65 (02/12 1133)  Labs: Recent Labs    09/06/19 0222 09/06/19 0222 09/07/19 0208 09/08/19 0033  HGB 9.8*   < > 9.8* 9.3*  HCT 32.8*  --  32.1* 31.1*  PLT 163  --  145* 134*  CREATININE 2.33*  --  2.37* 2.49*   < > = values in this interval not displayed.    Estimated Creatinine Clearance: 32 mL/min (A) (by C-G formula based on SCr of 2.49 mg/dL (H)).   Medical History: Past Medical History:  Diagnosis Date  . Acute on chronic diastolic (congestive) heart failure ([place]) 02/05/2017  . Arthritis   . AVM (arteriovenous malformation) of colon   . AVM (arteriovenous malformation) of small bowel, acquired with hemorrhage 01/20/2012   Found on small bowel capsule endoscopy, June 2013   . Carotid artery disease ([place])    a. Carotid dopple 03/2014 38-25% RICA &  05-39% LICA stenosis b. 7/67  . CKD (chronic kidney disease), stage III   . Clostridium difficile colitis 05/13/2016  . Coronary artery disease involving native coronary artery of native heart without angina pectoris 07/31/2016   Lopressor - d/c 7/19, Lipitor and ASA  . GERD (gastroesophageal reflux disease)   . Gout   . H/O transfusion of whole blood   . Hx of adenomatous colonic polyps 07/02/2016  . Hyperlipidemia   . Hypertension   . Hypothyroidism   . Iron deficiency anemia   . Nonrheumatic  aortic valve stenosis   . NSTEMI (non-ST elevated myocardial infarction) ([place])    hx/notes 06/25/2017  . PAD (peripheral artery disease) ([place]) 07/31/2016   On ASA, Plavix  . Personal history of colonic polyps 2007, 2008   adenoma each time, largest 12 mm in 2007  . Plaque psoriasis   . Psoriasis   . Septic phlebitis of upper extremity 08/26/2016   2018 L hand Dr Megan Salon f/u Keflex and Vanc po  . Staph infection 07/2016   left hand   . Subclavian artery stenosis, right ([place]) 01/16/2015  . Type II diabetes mellitus (HCC)    TYPE 2     Assessment: 79 y.o male with h/o CKD 3, HTN, HFpEF, paroxysmal A. Fib not on anticoagulation PTA due to prior GI bleed, psoriasis, small beowe AVM 2018 with coincidental hemorrhoidal etiology in the past.  Admitted 08/29/2019 for coronavirus 19 pneumonia.  Noted Hemorrhoidal bleeding.  GI evaluated patient on 09/21/2019 and deferred colonoscopy given respiratory status.    LE dopplers + for DVT right peroneal left Tibial vein.  Pharmacy consulted to start IV heparin vte treatment dose. MD plans to eventually transition to oral NOAC  Hgb 9.8>9.3, pltc 145>134, (admit Hgb 8.6 and pltc 224 on 09/08/2019) He was receiving SQ heparin 7500 units q8h,  last given 09/08/19 @  0507 No bleeding currently per RN SCr 2.49,  H/o CKC stage 3  Goal of Therapy:  Heparin level 0.3-0.7 units/ml Monitor platelets by anticoagulation protocol: Yes   Plan:  Discontinue SQ heparin  Give heparin bolus 4000 units and start heparin drip at 1550 units/hr  Check 8 hr HL Daily HL, CBC  Thank you for allowing pharmacy to be part of this patients care team. Nicole Cella, RPh Clinical Pharmacist 09/08/2019,3:08 PM

## 2019-09-08 NOTE — Progress Notes (Signed)
Pt has increasingly been more restless, removing NRB mask. Desats down to 70s without the NRB, Dr Myna Hidalgo informed, trialled Trazadone, no relief. Now pt's work of breathing appears more labored. Coarse crackles noted bilaterally. IV fluids stopped for now. MD RRT RT and charge RN informed of changes

## 2019-09-08 NOTE — Progress Notes (Signed)
Hospitalist progress note   Patient from home, Patient going unclear, Dispo pending clinical improvement  Michael [name] [id number] DOB: [birth year] DOA: 08/30/2019  PCP: Cassandria Anger, MD   Narrative:  57 white male CKD 3, HTN, HFpEF, paroxysmal A. fib not on anticoagulation due to prior GI bleed CAD cath 2018 NSTEMI, psoriasis, small bowel AVM 2018 with coincidental hemorrhoidal etiology in the past Initially was admitted at [facility] however because of worsening oxygenation status transferred over to [place] evaluated the patient 2/1 and deferred colonoscopy given respiratory status and he is being treated empirically for hemorrhoidal bleed He has required hi flow nasal oxygen 15 liters with difficulty weaning--empircally has trialed lasix but worsening azotemia precluded this Ct scan chest pending   Data Reviewed:  COVID-19 Labs  Recent Labs    09/06/19 0222 09/07/19 0208 09/08/19 0033  DDIMER  --   --  2.20*  FERRITIN 1,902* 1,741* 1,543*  CRP 6.1* 3.5* 1.7*    Lab Results  Component Value Date   SARSCOV2NAA POSITIVE (A) 09/24/2019   [place] NEGATIVE 01/31/2019   [place] NEGATIVE 01/04/2019   K 5.9--->6.1-->5.7, BUn/Creat 113/2.37--->124/2.49, Calcium 7.1 WBC 15.2 Hb 9.3 PLT 134 CXR personally reviewed shows less count of intercostal spaces compared to 2/8 CT chest shows predominant interstitial airspace diffuse pna  LE dopplers + for DVT  Assessment & Plan:  Multifocal coronavirus 19 pneumonia without improvement Remains on 15 L nonrebreather completed remdesivir 2/5 and continues on steroids since 2/1 Not a candidate for Actemra given being treated with Cosentyx for psoriasis Also on Lasix earlier in admission-CXR this a.m. suggestive of clearing confluences? Fluid  WBC ? 2/2 steroid use, pro calcitonin is low--doubt bact lung infection Ct chest confirms PNA more so that fluid--continue low rate IVF as below DVT R peroneal left Tibial  vn Starting weight based heparin for DVt Rx-eventuall transition to NOAC when creatinine a little more stable Coag negative staph bacteremia Evaluated earlier by infectious disease completed Ancef 08/31/2019 Prior history of AVM 2017,?  Hemorrhoidal bleeding on admission Hemoglobin has been stable-GI evaluated 2/1 and will follow as outpatient with no role at this time for colonoscopy Monitor carefully trends of hemoglobin Hyperkalemia AKI superimposed on CKD 3 A. persistently elevated potassium-schedule Lokelma 10 mg twice daily-if does not resolve will need to discuss with nephrology further work-up/planning B. Minimize nephrotoxins and discontinue lisinopril as of 2/10 --starting IVf at 50 cc/h and monitor trends carefully--cxr suggestive PNA? Paroxysmal A. fib CHADS2 score >3 elevated has bled score Continue amiodarone 200, Cardizem 180, metoprolol 12.5 twice daily--ok to continue meds despite mild bradycadia HFpEF- Meds as above periodic CXR We will get strict ins and outs-he appears to be at -1.009 DM TY 2 with neuropathy-A1c 5.7 CBG range 299-240 ? Lantus 32 and sliding scale while on steroids--opn 70 30 6 am and 6 pm at home Continue gabapentin but have lower dose given renal dysfunction Prior history of CAD Last cath 07/2016 PCI Synergy DES with triple therapy Currently quiesced sent meds as above History of psoriasis Previously on Cosentyx  Called wife [phone] and updated on 2/12--he is at higher risk for decompensation but wife confirms full code status   Subjective:  Quite sleepy this am--nursing reports easier desats down to 70's with simple turning in the bed No cp f chills Awaiting ct scan today  Consultants:   Non currently  Objective: Vitals:   09/08/19 0710 09/08/19 0711 09/08/19 0712 09/08/19 0713  BP:  (!) 156/55  Pulse: 60 60 61 61  Resp: (!) 21 (!) 25 (!) 23 (!) 22  Temp:  97.9 F (36.6 C)    TempSrc:  Oral    SpO2: 92% 93% 93% 95%  Weight:       Height:        Intake/Output Summary (Last 24 hours) at 09/08/2019 0717 Last data filed at 09/08/2019 0400 Gross per 24 hour  Intake 1498.42 ml  Output 1300 ml  Net 198.42 ml   Filed Weights   09/21/2019 1830  Weight: 110.7 kg    Examination:  Sleepy no distress eomi cta b clear anteriorly  abd soft nt nd no rebound no guard Trace LE edema Neuro grossly intact but sleepy   Scheduled Meds: . allopurinol  200 mg Oral Daily  . amiodarone  200 mg Oral Once per day on Mon Tue Wed Thu Fri Sat  . vitamin C  500 mg Oral Daily  . atorvastatin  40 mg Oral Daily  . diltiazem  180 mg Oral Daily  . fenofibrate  160 mg Oral Daily  . ferrous sulfate  325 mg Oral Daily  . gabapentin  100 mg Oral BID  . guaiFENesin  600 mg Oral BID  . heparin injection (subcutaneous)  7,500 Units Subcutaneous Q8H  . hydrALAZINE  50 mg Oral TID  . insulin aspart  0-5 Units Subcutaneous QHS  . insulin aspart  0-9 Units Subcutaneous TID WC  . insulin aspart  6 Units Subcutaneous TID WC  . insulin glargine  28 Units Subcutaneous Daily  . Ipratropium-Albuterol  1 puff Inhalation Q6H  . levothyroxine  150 mcg Oral QAC breakfast  . methylPREDNISolone (SOLU-MEDROL) injection  40 mg Intravenous Q12H  . metoprolol tartrate  12.5 mg Oral BID  . sodium zirconium cyclosilicate  10 g Oral BID  . zinc sulfate  220 mg Oral Daily   Continuous Infusions: . sodium chloride 50 mL/hr at 09/08/19 0400     LOS: 11 days   Time spent: [place], MD [facility]  09/08/2019, 7:17 AM

## 2019-09-08 NOTE — Progress Notes (Signed)
OT Cancellation Note  Patient Details Name: Michael [name] MRN: [id number] DOB: [birth year]   Cancelled Treatment:   Medical Hold due to increase in O2 demands and need for chest CT. Will attempt OT tx session on different date and time when cleared by medical staff to resume POC.   Valin Massie OTR/L     Harland Aguiniga 09/08/2019, 2:00 PM

## 2019-09-08 NOTE — Progress Notes (Signed)
Inpatient Diabetes Program Recommendations  AACE/ADA: New Consensus Statement on Inpatient Glycemic Control (2015)  Target Ranges:  Prepandial:   less than 140 mg/dL      Peak postprandial:   less than 180 mg/dL (1-2 hours)      Critically ill patients:  140 - 180 mg/dL   Lab Results  Component Value Date   GLUCAP 299 (H) 09/07/2019   HGBA1C 5.7 (H) 09/05/2019    Review of Glycemic Control Results for CAM, DAUPHIN (MRN [id number]) as of 09/08/2019 10:48  Ref. Range 09/06/2019 19:59 09/07/2019 07:03 09/07/2019 11:19 09/07/2019 16:15 09/07/2019 20:01  Glucose-Capillary Latest Ref Range: 70 - 99 mg/dL 309 (H) 231 (H) 265 (H) 224 (H) 299 (H)    Current orders for Inpatient glycemic control:  Novolog 0-9 units TID + 0-5 units QHS + 6 units TID with meals + Lantus 32 units QD (increased today) + Solmedrol 40 mg Q12H  Inpatient Diabetes Program Recommendations:     -Novolog 0-15 units TID with meals -Novolog 8 units TID with meals  Thank you, Reche Dixon, RN, BSN Diabetes Coordinator Inpatient Diabetes Program [phone] (team pager from 8a-5p)

## 2019-09-08 NOTE — Progress Notes (Signed)
Venous duplex lower ext  has been completed. Refer to [facility] under chart review to view preliminary results.   09/08/2019  3:14 PM Darya Bigler, Bonnye Fava

## 2019-09-08 NOTE — Progress Notes (Signed)
This RN notified Dr. Verlon Au about patients HR in the 50's -60's. This RN asked if he wanted the antiarrythmics and beta blocker held. Verbal order received to administer all medications. Medications given. Will monitor closely.

## 2019-09-08 NOTE — Progress Notes (Addendum)
PT Cancellation Note  Patient Details Name: Michael [name] MRN: [id number] DOB: Apr 21, 1941   Cancelled Treatment:    Reason Eval/Treat Not Completed: Medical issues which prohibited therapy; Other (comment) Spoke to RN, HOLD until CT due to large increase in O2 demands.   Will attempt PT tx session on different date and time when cleared by medical staff to resume POC.   Ann Held PT, DPT [facility] [place] P: [place] 09/08/2019, 11:00 AM

## 2019-09-08 NOTE — Progress Notes (Signed)
This RN gave patients wife an update. This RN informed her of his increased o2 needs this morning and increased somnolence.  This RN informed her that the MD will be holding his Gabapentin and ordered a chest CT.  All questions answered and this RN informed her I will give her a call if anything changes.

## 2019-09-09 ENCOUNTER — Inpatient Hospital Stay (HOSPITAL_COMMUNITY): Payer: HMO

## 2019-09-09 ENCOUNTER — Other Ambulatory Visit: Payer: Self-pay

## 2019-09-09 DIAGNOSIS — Z9911 Dependence on respirator [ventilator] status: Secondary | ICD-10-CM

## 2019-09-09 DIAGNOSIS — G9340 Encephalopathy, unspecified: Secondary | ICD-10-CM

## 2019-09-09 DIAGNOSIS — E875 Hyperkalemia: Secondary | ICD-10-CM

## 2019-09-09 DIAGNOSIS — N179 Acute kidney failure, unspecified: Secondary | ICD-10-CM

## 2019-09-09 DIAGNOSIS — N184 Chronic kidney disease, stage 4 (severe): Secondary | ICD-10-CM

## 2019-09-09 DIAGNOSIS — I340 Nonrheumatic mitral (valve) insufficiency: Secondary | ICD-10-CM

## 2019-09-09 DIAGNOSIS — I361 Nonrheumatic tricuspid (valve) insufficiency: Secondary | ICD-10-CM

## 2019-09-09 DIAGNOSIS — I350 Nonrheumatic aortic (valve) stenosis: Secondary | ICD-10-CM

## 2019-09-09 DIAGNOSIS — J9601 Acute respiratory failure with hypoxia: Secondary | ICD-10-CM

## 2019-09-09 DIAGNOSIS — I472 Ventricular tachycardia: Secondary | ICD-10-CM

## 2019-09-09 LAB — RENAL FUNCTION PANEL
Albumin: 1.9 g/dL — ABNORMAL LOW (ref 3.5–5.0)
Anion gap: 12 (ref 5–15)
BUN: 90 mg/dL — ABNORMAL HIGH (ref 8–23)
CO2: 27 mmol/L (ref 22–32)
Calcium: 7.9 mg/dL — ABNORMAL LOW (ref 8.9–10.3)
Chloride: 104 mmol/L (ref 98–111)
Creatinine, Ser: 2.97 mg/dL — ABNORMAL HIGH (ref 0.61–1.24)
GFR calc Af Amer: 23 mL/min — ABNORMAL LOW (ref >60)
GFR calc non Af Amer: 20 mL/min — ABNORMAL LOW (ref >60)
Glucose, Bld: 255 mg/dL — ABNORMAL HIGH (ref 70–99)
Phosphorus: 9.5 mg/dL — ABNORMAL HIGH (ref 2.5–4.6)
Potassium: 7.5 mmol/L (ref 3.5–5.1)
Sodium: 143 mmol/L (ref 135–145)

## 2019-09-09 LAB — BASIC METABOLIC PANEL
Anion gap: 10 (ref 5–15)
Anion gap: 8 (ref 5–15)
BUN: 113 mg/dL — ABNORMAL HIGH (ref 8–23)
BUN: 124 mg/dL — ABNORMAL HIGH (ref 8–23)
CO2: 24 mmol/L (ref 22–32)
CO2: 28 mmol/L (ref 22–32)
Calcium: 7.4 mg/dL — ABNORMAL LOW (ref 8.9–10.3)
Calcium: 7.3 mg/dL — ABNORMAL LOW (ref 8.9–10.3)
Chloride: 106 mmol/L (ref 98–111)
Chloride: 104 mmol/L (ref 98–111)
Creatinine, Ser: 2.24 mg/dL — ABNORMAL HIGH (ref 0.61–1.24)
Creatinine, Ser: 2.78 mg/dL — ABNORMAL HIGH (ref 0.61–1.24)
GFR calc Af Amer: 32 mL/min — ABNORMAL LOW (ref >60)
GFR calc Af Amer: 25 mL/min — ABNORMAL LOW (ref >60)
GFR calc non Af Amer: 27 mL/min — ABNORMAL LOW (ref >60)
GFR calc non Af Amer: 21 mL/min — ABNORMAL LOW (ref >60)
Glucose, Bld: 116 mg/dL — ABNORMAL HIGH (ref 70–99)
Glucose, Bld: 200 mg/dL — ABNORMAL HIGH (ref 70–99)
Potassium: 7.2 mmol/L (ref 3.5–5.1)
Potassium: >7.5 mmol/L (ref 3.5–5.1)
Sodium: 140 mmol/L (ref 135–145)
Sodium: 140 mmol/L (ref 135–145)

## 2019-09-09 LAB — CBC WITH DIFFERENTIAL/PLATELET
Abs Immature Granulocytes: 0.18 10*3/uL — ABNORMAL HIGH (ref 0.00–0.07)
Basophils Absolute: 0 10*3/uL (ref 0.0–0.1)
Basophils Relative: 0 %
Eosinophils Absolute: 0 10*3/uL (ref 0.0–0.5)
Eosinophils Relative: 0 %
HCT: 29.4 % — ABNORMAL LOW (ref 39.0–52.0)
Hemoglobin: 8.9 g/dL — ABNORMAL LOW (ref 13.0–17.0)
Immature Granulocytes: 1 %
Lymphocytes Relative: 1 %
Lymphs Abs: 0.1 10*3/uL — ABNORMAL LOW (ref 0.7–4.0)
MCH: 27.5 pg (ref 26.0–34.0)
MCHC: 30.3 g/dL (ref 30.0–36.0)
MCV: 90.7 fL (ref 80.0–100.0)
Monocytes Absolute: 0.6 10*3/uL (ref 0.1–1.0)
Monocytes Relative: 2 %
Neutro Abs: 24.9 10*3/uL — ABNORMAL HIGH (ref 1.7–7.7)
Neutrophils Relative %: 96 %
Platelets: 128 10*3/uL — ABNORMAL LOW (ref 150–400)
RBC: 3.24 MIL/uL — ABNORMAL LOW (ref 4.22–5.81)
RDW: 16.6 % — ABNORMAL HIGH (ref 11.5–15.5)
WBC: 25.9 10*3/uL — ABNORMAL HIGH (ref 4.0–10.5)
nRBC: 0 % (ref 0.0–0.2)

## 2019-09-09 LAB — CBC
HCT: 37.2 % — ABNORMAL LOW (ref 39.0–52.0)
Hemoglobin: 11.2 g/dL — ABNORMAL LOW (ref 13.0–17.0)
MCH: 27.3 pg (ref 26.0–34.0)
MCHC: 30.1 g/dL (ref 30.0–36.0)
MCV: 90.7 fL (ref 80.0–100.0)
Platelets: 259 10*3/uL (ref 150–400)
RBC: 4.1 MIL/uL — ABNORMAL LOW (ref 4.22–5.81)
RDW: 17.2 % — ABNORMAL HIGH (ref 11.5–15.5)
WBC: 48.1 10*3/uL — ABNORMAL HIGH (ref 4.0–10.5)
nRBC: 0 % (ref 0.0–0.2)

## 2019-09-09 LAB — ECHOCARDIOGRAM COMPLETE
Height: 71 in
Weight: 3904.79 oz

## 2019-09-09 LAB — HEPARIN LEVEL (UNFRACTIONATED)
Heparin Unfractionated: 0.45 IU/mL (ref 0.30–0.70)
Heparin Unfractionated: 0.62 IU/mL (ref 0.30–0.70)

## 2019-09-09 LAB — POCT I-STAT 7, (LYTES, BLD GAS, ICA,H+H)
Acid-Base Excess: 4 mmol/L — ABNORMAL HIGH (ref 0.0–2.0)
Acid-Base Excess: 2 mmol/L (ref 0.0–2.0)
Bicarbonate: 30 mmol/L — ABNORMAL HIGH (ref 20.0–28.0)
Bicarbonate: 29.5 mmol/L — ABNORMAL HIGH (ref 20.0–28.0)
Calcium, Ion: 1.13 mmol/L — ABNORMAL LOW (ref 1.15–1.40)
Calcium, Ion: 1.18 mmol/L (ref 1.15–1.40)
HCT: 24 % — ABNORMAL LOW (ref 39.0–52.0)
HCT: 25 % — ABNORMAL LOW (ref 39.0–52.0)
Hemoglobin: 8.2 g/dL — ABNORMAL LOW (ref 13.0–17.0)
Hemoglobin: 8.5 g/dL — ABNORMAL LOW (ref 13.0–17.0)
O2 Saturation: 96 %
O2 Saturation: 95 %
Patient temperature: 98
Patient temperature: 98.8
Potassium: 7.2 mmol/L (ref 3.5–5.1)
Potassium: 5.9 mmol/L — ABNORMAL HIGH (ref 3.5–5.1)
Sodium: 141 mmol/L (ref 135–145)
Sodium: 143 mmol/L (ref 135–145)
TCO2: 32 mmol/L (ref 22–32)
TCO2: 31 mmol/L (ref 22–32)
pCO2 arterial: 54.1 mmHg — ABNORMAL HIGH (ref 32.0–48.0)
pCO2 arterial: 66.7 mmHg (ref 32.0–48.0)
pH, Arterial: 7.35 (ref 7.350–7.450)
pH, Arterial: 7.255 — ABNORMAL LOW (ref 7.350–7.450)
pO2, Arterial: 89 mmHg (ref 83.0–108.0)
pO2, Arterial: 90 mmHg (ref 83.0–108.0)

## 2019-09-09 LAB — MRSA PCR SCREENING: MRSA by PCR: NEGATIVE

## 2019-09-09 LAB — GLUCOSE, CAPILLARY
Glucose-Capillary: 270 mg/dL — ABNORMAL HIGH (ref 70–99)
Glucose-Capillary: 157 mg/dL — ABNORMAL HIGH (ref 70–99)
Glucose-Capillary: 176 mg/dL — ABNORMAL HIGH (ref 70–99)
Glucose-Capillary: 233 mg/dL — ABNORMAL HIGH (ref 70–99)
Glucose-Capillary: 178 mg/dL — ABNORMAL HIGH (ref 70–99)

## 2019-09-09 LAB — LACTIC ACID, PLASMA
Lactic Acid, Venous: 1.5 mmol/L (ref 0.5–1.9)
Lactic Acid, Venous: 2.5 mmol/L (ref 0.5–1.9)

## 2019-09-09 LAB — FERRITIN: Ferritin: 2101 ng/mL — ABNORMAL HIGH (ref 24–336)

## 2019-09-09 LAB — POTASSIUM
Potassium: 7.5 mmol/L (ref 3.5–5.1)
Potassium: 8 mmol/L (ref 3.5–5.1)

## 2019-09-09 LAB — C-REACTIVE PROTEIN: CRP: 5 mg/dL — ABNORMAL HIGH (ref <1.0)

## 2019-09-09 MED ORDER — FUROSEMIDE 10 MG/ML IJ SOLN
60.0000 mg | Freq: Once | INTRAMUSCULAR | Status: AC
  Administered 2019-09-09: 60 mg via INTRAVENOUS
  Filled 2019-09-09: qty 6

## 2019-09-09 MED ORDER — FENTANYL CITRATE (PF) 100 MCG/2ML IJ SOLN
25.0000 ug | Freq: Once | INTRAMUSCULAR | Status: AC
  Administered 2019-09-09: 25 ug via INTRAVENOUS
  Filled 2019-09-09: qty 2

## 2019-09-09 MED ORDER — FENTANYL BOLUS VIA INFUSION
25.0000 ug | INTRAVENOUS | Status: DC | PRN
  Filled 2019-09-09: qty 25

## 2019-09-09 MED ORDER — SENNOSIDES 8.8 MG/5ML PO SYRP
5.0000 mL | ORAL_SOLUTION | Freq: Every day | ORAL | Status: DC
  Administered 2019-09-10 – 2019-09-11 (×2): 5 mL
  Filled 2019-09-09 (×2): qty 5

## 2019-09-09 MED ORDER — DEXTROSE 50 % IV SOLN
1.0000 | Freq: Once | INTRAVENOUS | Status: AC
  Administered 2019-09-09: 50 mL via INTRAVENOUS
  Filled 2019-09-09: qty 50

## 2019-09-09 MED ORDER — FENTANYL 2500MCG IN NS 250ML (10MCG/ML) PREMIX INFUSION
25.0000 ug/h | INTRAVENOUS | Status: DC
  Administered 2019-09-09 – 2019-09-10 (×2): 125 ug/h via INTRAVENOUS
  Filled 2019-09-09: qty 250

## 2019-09-09 MED ORDER — FENTANYL 2500MCG IN NS 250ML (10MCG/ML) PREMIX INFUSION
25.0000 ug/h | INTRAVENOUS | Status: DC
  Administered 2019-09-09: 25 ug/h via INTRAVENOUS
  Filled 2019-09-09: qty 250

## 2019-09-09 MED ORDER — PRISMASOL BGK 0/2.5 32-2.5 MEQ/L IV SOLN
INTRAVENOUS | Status: DC
  Filled 2019-09-09 (×13): qty 5000

## 2019-09-09 MED ORDER — ALBUTEROL SULFATE (2.5 MG/3ML) 0.083% IN NEBU
INHALATION_SOLUTION | RESPIRATORY_TRACT | Status: AC
  Administered 2019-09-09: 14:00:00 10 mg via RESPIRATORY_TRACT
  Filled 2019-09-09: qty 3

## 2019-09-09 MED ORDER — PRISMASOL BGK 4/2.5 32-4-2.5 MEQ/L REPLACEMENT SOLN: Status: DC

## 2019-09-09 MED ORDER — SODIUM BICARBONATE 8.4 % IV SOLN: 50.0000 meq | Freq: Once | INTRAVENOUS | Status: AC

## 2019-09-09 MED ORDER — SODIUM CHLORIDE 0.9 % IV SOLN
2.0000 g | Freq: Two times a day (BID) | INTRAVENOUS | Status: DC
  Administered 2019-09-09 – 2019-09-11 (×4): 2 g via INTRAVENOUS
  Filled 2019-09-09 (×5): qty 2

## 2019-09-09 MED ORDER — SODIUM BICARBONATE 8.4 % IV SOLN
INTRAVENOUS | Status: AC
  Administered 2019-09-09: 50 meq via INTRAVENOUS
  Filled 2019-09-09: qty 50

## 2019-09-09 MED ORDER — FUROSEMIDE 10 MG/ML IJ SOLN
40.0000 mg | Freq: Four times a day (QID) | INTRAMUSCULAR | Status: DC
  Administered 2019-09-09: 40 mg via INTRAVENOUS
  Filled 2019-09-09: qty 4

## 2019-09-09 MED ORDER — CALCIUM GLUCONATE-NACL 1-0.675 GM/50ML-% IV SOLN
INTRAVENOUS | Status: AC
  Administered 2019-09-09: 1000 mg via INTRAVENOUS
  Filled 2019-09-09: qty 50

## 2019-09-09 MED ORDER — CALCIUM GLUCONATE-NACL 1-0.675 GM/50ML-% IV SOLN
1.0000 g | Freq: Once | INTRAVENOUS | Status: AC
  Administered 2019-09-09: 1000 mg via INTRAVENOUS
  Filled 2019-09-09: qty 50

## 2019-09-09 MED ORDER — SODIUM CHLORIDE 0.9 % FOR CRRT
INTRAVENOUS_CENTRAL | Status: DC | PRN
  Filled 2019-09-09: qty 1000

## 2019-09-09 MED ORDER — METHYLPREDNISOLONE SODIUM SUCC 40 MG IJ SOLR
40.0000 mg | Freq: Every day | INTRAMUSCULAR | Status: DC
  Administered 2019-09-10 – 2019-09-11 (×2): 40 mg via INTRAVENOUS
  Filled 2019-09-09 (×2): qty 1

## 2019-09-09 MED ORDER — ALBUTEROL SULFATE (2.5 MG/3ML) 0.083% IN NEBU
INHALATION_SOLUTION | RESPIRATORY_TRACT | Status: AC
  Filled 2019-09-09: qty 12

## 2019-09-09 MED ORDER — FENTANYL CITRATE (PF) 100 MCG/2ML IJ SOLN: 25.0000 ug | INTRAMUSCULAR | Status: DC | PRN

## 2019-09-09 MED ORDER — INSULIN ASPART 100 UNIT/ML IV SOLN
5.0000 [IU] | Freq: Once | INTRAVENOUS | Status: AC
  Administered 2019-09-09: 13:00:00 5 [IU] via INTRAVENOUS

## 2019-09-09 MED ORDER — INSULIN ASPART 100 UNIT/ML IV SOLN
5.0000 [IU] | Freq: Once | INTRAVENOUS | Status: AC
  Administered 2019-09-09: 5 [IU] via INTRAVENOUS

## 2019-09-09 MED ORDER — SODIUM BICARBONATE 8.4 % IV SOLN
50.0000 meq | Freq: Once | INTRAVENOUS | Status: AC
  Administered 2019-09-09: 50 meq via INTRAVENOUS
  Filled 2019-09-09: qty 50

## 2019-09-09 MED ORDER — NOREPINEPHRINE 4 MG/250ML-% IV SOLN
0.0000 ug/min | INTRAVENOUS | Status: DC
  Administered 2019-09-09: 22:00:00 35 ug/min via INTRAVENOUS
  Administered 2019-09-09: 30 ug/min via INTRAVENOUS
  Filled 2019-09-09: qty 250

## 2019-09-09 MED ORDER — ALBUTEROL SULFATE (2.5 MG/3ML) 0.083% IN NEBU: 10.0000 mg | INHALATION_SOLUTION | Freq: Once | RESPIRATORY_TRACT | Status: AC

## 2019-09-09 MED ORDER — SODIUM CHLORIDE 0.9 % IV SOLN
1.0000 g | Freq: Once | INTRAVENOUS | Status: DC
  Administered 2019-09-09: 1 g via INTRAVENOUS

## 2019-09-09 MED ORDER — SODIUM BICARBONATE 8.4 % IV SOLN
INTRAVENOUS | Status: AC
  Administered 2019-09-09: 13:00:00 50 meq
  Filled 2019-09-09: qty 50

## 2019-09-09 MED ORDER — FENTANYL CITRATE (PF) 100 MCG/2ML IJ SOLN
25.0000 ug | INTRAMUSCULAR | Status: DC | PRN
  Administered 2019-09-09 (×2): 50 ug via INTRAVENOUS
  Filled 2019-09-09: qty 2

## 2019-09-09 MED ORDER — FENTANYL BOLUS VIA INFUSION
25.0000 ug | INTRAVENOUS | Status: DC | PRN
  Administered 2019-09-09: 25 ug via INTRAVENOUS
  Filled 2019-09-09: qty 25

## 2019-09-09 MED ORDER — VECURONIUM BROMIDE 10 MG IV SOLR: 0.0800 mg/kg | INTRAVENOUS | Status: DC | PRN

## 2019-09-09 MED ORDER — NOREPINEPHRINE 4 MG/250ML-% IV SOLN
INTRAVENOUS | Status: AC
  Administered 2019-09-09: 40 mg
  Filled 2019-09-09: qty 250

## 2019-09-09 MED ORDER — CALCIUM GLUCONATE 10 % IV SOLN
1.0000 g | Freq: Once | INTRAVENOUS | Status: DC
  Filled 2019-09-09: qty 10

## 2019-09-09 MED ORDER — CHLORHEXIDINE GLUCONATE CLOTH 2 % EX PADS
6.0000 | MEDICATED_PAD | Freq: Every day | CUTANEOUS | Status: DC
  Administered 2019-09-09 – 2019-09-10 (×2): 6 via TOPICAL

## 2019-09-09 MED ORDER — SODIUM POLYSTYRENE SULFONATE 15 GM/60ML PO SUSP
30.0000 g | Freq: Once | ORAL | Status: AC
  Administered 2019-09-09: 30 g via RECTAL

## 2019-09-09 MED ORDER — ARTIFICIAL TEARS OPHTHALMIC OINT
1.0000 "application " | TOPICAL_OINTMENT | Freq: Three times a day (TID) | OPHTHALMIC | Status: DC
  Administered 2019-09-09 – 2019-09-11 (×5): 1 via OPHTHALMIC
  Filled 2019-09-09: qty 3.5

## 2019-09-09 MED ORDER — VANCOMYCIN HCL 1500 MG/300ML IV SOLN
1500.0000 mg | Freq: Once | INTRAVENOUS | Status: AC
  Administered 2019-09-09: 18:00:00 1500 mg via INTRAVENOUS
  Filled 2019-09-09: qty 300

## 2019-09-09 MED ORDER — VANCOMYCIN HCL IN DEXTROSE 1-5 GM/200ML-% IV SOLN
1000.0000 mg | INTRAVENOUS | Status: DC
  Administered 2019-09-10: 18:00:00 1000 mg via INTRAVENOUS
  Filled 2019-09-09 (×2): qty 200

## 2019-09-09 MED ORDER — INSULIN ASPART 100 UNIT/ML ~~LOC~~ SOLN
1.0000 [IU] | SUBCUTANEOUS | Status: DC
  Administered 2019-09-09: 2 [IU] via SUBCUTANEOUS
  Administered 2019-09-09: 3 [IU] via SUBCUTANEOUS
  Administered 2019-09-10: 1 [IU] via SUBCUTANEOUS

## 2019-09-09 MED ORDER — PROPOFOL 1000 MG/100ML IV EMUL
25.0000 ug/kg/min | INTRAVENOUS | Status: DC
  Administered 2019-09-09 – 2019-09-10 (×5): 25 ug/kg/min via INTRAVENOUS
  Administered 2019-09-11: 08:00:00 20 ug/kg/min via INTRAVENOUS
  Administered 2019-09-11: 25 ug/kg/min via INTRAVENOUS
  Filled 2019-09-09 (×7): qty 100

## 2019-09-09 MED ORDER — CALCIUM GLUCONATE-NACL 1-0.675 GM/50ML-% IV SOLN: 1.0000 g | Freq: Once | INTRAVENOUS | Status: AC

## 2019-09-09 MED ORDER — PHENYLEPHRINE HCL-NACL 10-0.9 MG/250ML-% IV SOLN
0.0000 ug/min | INTRAVENOUS | Status: DC
  Administered 2019-09-09: 20 ug/min via INTRAVENOUS
  Administered 2019-09-10: 100 ug/min via INTRAVENOUS
  Filled 2019-09-09 (×2): qty 250

## 2019-09-09 MED ORDER — DEXTROSE 50 % IV SOLN
1.0000 | Freq: Once | INTRAVENOUS | Status: AC
  Administered 2019-09-09: 13:00:00 50 mL via INTRAVENOUS

## 2019-09-09 MED ORDER — SODIUM BICARBONATE 8.4 % IV SOLN
INTRAVENOUS | Status: AC
  Filled 2019-09-09: qty 50

## 2019-09-09 MED ORDER — ALTEPLASE 2 MG IJ SOLR
2.0000 mg | Freq: Once | INTRAMUSCULAR | Status: DC | PRN
  Filled 2019-09-09: qty 2

## 2019-09-09 MED ORDER — HEPARIN SODIUM (PORCINE) 1000 UNIT/ML DIALYSIS
1000.0000 [IU] | INTRAMUSCULAR | Status: DC | PRN
  Filled 2019-09-09: qty 6

## 2019-09-09 MED ORDER — FENTANYL CITRATE (PF) 100 MCG/2ML IJ SOLN: 25.0000 ug | Freq: Once | INTRAMUSCULAR | Status: DC

## 2019-09-09 MED ORDER — DEXMEDETOMIDINE HCL IN NACL 400 MCG/100ML IV SOLN
0.0000 ug/kg/h | INTRAVENOUS | Status: DC
  Administered 2019-09-09: 17:00:00 0.2 ug/kg/h via INTRAVENOUS
  Filled 2019-09-09: qty 100

## 2019-09-09 MED ORDER — SODIUM POLYSTYRENE SULFONATE 15 GM/60ML PO SUSP
15.0000 g | Freq: Once | ORAL | Status: AC
  Administered 2019-09-09: 11:00:00 15 g via ORAL
  Filled 2019-09-09: qty 60

## 2019-09-09 MED ORDER — CALCIUM GLUCONATE-NACL 1-0.675 GM/50ML-% IV SOLN: 1.0000 g | Freq: Once | INTRAVENOUS | Status: DC

## 2019-09-09 MED ORDER — ALBUTEROL SULFATE HFA 108 (90 BASE) MCG/ACT IN AERS
1.0000 | INHALATION_SPRAY | RESPIRATORY_TRACT | Status: DC
  Filled 2019-09-09: qty 6.7

## 2019-09-09 NOTE — Progress Notes (Signed)
eLink Physician-Brief Progress Note Patient Name: Michael [name] DOB: 20-Aug-1940 MRN: [id number]   Date of Service  09/09/2019  HPI/Events of Note  Ventilator asynchrony - Request for intermittent NMB.   eICU Interventions  Will order: 1. Intermittent NMB with Vecuronium per ICU NMB for mechanically ventilated patients. 2. Sedate with Fentanyl and Propofol IV infusions.  3. D/C Precedex IV infusion.     Intervention Category Major Interventions: Respiratory failure - evaluation and management  Sommer,Steven Eugene 09/09/2019, 8:07 PM

## 2019-09-09 NOTE — Progress Notes (Addendum)
ANTICOAGULATION CONSULT NOTE - Follow Up Consult  Pharmacy Consult for heparin Indication: VTE treatment + RLE DVT  Allergies  Allergen Reactions  . Percocet [Oxycodone-Acetaminophen] Nausea And Vomiting  . Ciprofloxacin Other (See Comments)    Light headed and made stagger  . Metformin And Related Nausea Only    Upset stomach  . Invokana [Canagliflozin] Rash    Patient Measurements: Height: 5\' 11"  (180.3 cm) Weight: 244 lb 0.8 oz (110.7 kg) IBW/kg (Calculated) : 75.3 Heparin Dosing Weight: 99 kg  Vital Signs: Temp: 98 F (36.7 C) (02/13 0800) Temp Source: Oral (02/13 0800) BP: 145/59 (02/13 0800) Pulse Rate: 75 (02/13 0800)  Labs: Recent Labs    09/07/19 0208 09/07/19 0208 09/08/19 0033 09/09/19 0203 09/09/19 1200  HGB 9.8*   < > 9.3* 11.2*  --   HCT 32.1*  --  31.1* 37.2*  --   PLT 145*  --  134* 259  --   HEPARINUNFRC  --   --   --  0.45 0.62  CREATININE 2.37*  --  2.49* 2.24*  --    < > = values in this interval not displayed.    Estimated Creatinine Clearance: 35.5 mL/min (A) (by C-G formula based on SCr of 2.24 mg/dL (H)).   Medications:  Scheduled:  . albuterol  10 mg Nebulization Once  . albuterol      . albuterol  1 puff Inhalation Q4H  . allopurinol  200 mg Oral Daily  . amiodarone  200 mg Oral Once per day on Mon Tue Wed Thu Fri Sat  . vitamin C  500 mg Oral Daily  . atorvastatin  40 mg Oral Daily  . insulin aspart  5 Units Intravenous Once   And  . dextrose  1 ampule Intravenous Once  . diltiazem  180 mg Oral Daily  . fenofibrate  160 mg Oral Daily  . ferrous sulfate  325 mg Oral Daily  . furosemide  40 mg Intravenous Q6H  . guaiFENesin  600 mg Oral BID  . hydrALAZINE  50 mg Oral TID  . insulin aspart  0-5 Units Subcutaneous QHS  . insulin aspart  0-9 Units Subcutaneous TID WC  . insulin aspart  6 Units Subcutaneous TID WC  . insulin glargine  32 Units Subcutaneous Daily  . Ipratropium-Albuterol  1 puff Inhalation Q6H  . levothyroxine   150 mcg Oral QAC breakfast  . [START ON 09/10/2019] methylPREDNISolone (SOLU-MEDROL) injection  40 mg Intravenous Daily  . metoprolol tartrate  12.5 mg Oral BID  . sodium bicarbonate      . sodium bicarbonate      . sodium bicarbonate  50 mEq Intravenous Once  . sodium polystyrene  30 g Rectal Once  . sodium zirconium cyclosilicate  10 g Oral BID   Infusions:  . calcium gluconate    . calcium gluconate in NaCl    . heparin 1,550 Units/hr (09/09/19 4008)    Assessment: 79 y.o male with h/o CKD 3, HTN, HFpEF, paroxysmal A. Fib not on anticoagulation PTA due to prior GI bleed, psoriasis, small beowe AVM 2018 with coincidental hemorrhoidal etiology in the past.  Admitted 09/03/2019 for coronavirus 19 pneumonia.  Noted Hemorrhoidal bleeding.  GI evaluated patient on 09/05/2019 and deferred colonoscopy given respiratory status.  He was receiving SQ heparin 7500 units q8h,  last given 09/08/19 @  0507.  LE dopplers + for DVT right peroneal left Tibial vein.  Pharmacy consulted to start IV heparin vte treatment dose.  Today,  09/09/2019: Heparin level 0.62, remains therapeutic on heparin at 1550 units/hr CBC: Hgb increased to 11.2 (admit Hgb 8.6, yesterday 9.3), Plt up to 259 (admit 224, yesterday 134) No bleeding noted SCr 2.24,  H/o CKC stage 3 Intubated 2/13 with plan for CRRT catheter placement.  Goal of Therapy:  Heparin level 0.3-0.7 units/ml Monitor platelets by anticoagulation protocol: Yes   Plan:  Continue heparin IV infusion at 1550 units/hr Daily heparin level and CBC  Gretta Arab PharmD, BCPS Clinical pharmacist phone 7am- 5pm: [phone] 09/09/2019 1:43 PM  Addendum: RN reports that heparin infusion was turned off during emergent transport to ICU, around 1330.  RN will update charting. No bleeding or complications reported after procedures (intubation, HD catheter placement) Restarted at 1550 units/hr (no bolus giving rising HL at last check) at 15:30 pm per RN.  Gretta Arab  PharmD, BCPS Clinical pharmacist phone 7am- 5pm: [phone] 09/09/2019 3:28 PM

## 2019-09-09 NOTE — Progress Notes (Addendum)
Pharmacy Antibiotic Note  Michael [name] is a 79 y.o. male admitted on 08/31/2019 with COVID-19.  He previously completed a course of cefazolin for MSSE bacteremia.  He was transferred to ICU for emergent intubation, concern for pneumonia.  Pharmacy has been consulted for Cefepime and Vancomycin dosing. SCr increased to 2.78, WBC increased to 48, Afebrile Starting CRRT on 2/13.  Plan: Cefepime 2g IV q12h when CRRT starts. Vancomycin 1500mg  IV x1 dose, then 1000 mg IV q24h when on CRRT. Measure Vanc peak and trough at steady state.  Goal AUC = 400 - 550 Follow up renal function, culture results, and clinical course.   Height: 5\' 11"  (180.3 cm) Weight: 244 lb 0.8 oz (110.7 kg) IBW/kg (Calculated) : 75.3  Temp (24hrs), Avg:98.3 F (36.8 C), Min:98 F (36.7 C), Max:98.4 F (36.9 C)  Recent Labs  Lab 09/05/19 0256 09/05/19 0256 09/06/19 0222 09/07/19 0208 09/08/19 0033 09/09/19 0203 09/09/19 1200  WBC 13.9*  --  15.1* 15.7* 15.2* 48.1*  --   CREATININE 2.34*   < > 2.33* 2.37* 2.49* 2.24* 2.78*  LATICACIDVEN  --   --   --   --   --   --  1.5   < > = values in this interval not displayed.    Estimated Creatinine Clearance: 28.6 mL/min (A) (by C-G formula based on SCr of 2.78 mg/dL (H)).    Allergies  Allergen Reactions  . Percocet [Oxycodone-Acetaminophen] Nausea And Vomiting  . Ciprofloxacin Other (See Comments)    Light headed and made stagger  . Metformin And Related Nausea Only    Upset stomach  . Invokana [Canagliflozin] Rash    Antimicrobials this admission: 2/1 Remdesivir >> 2/5 2/2 Ancef >> 2/8 2/13 Vancomycin >>  2/13 Cefepime >>   Dose adjustments this admission:   Microbiology results: 2/1 BCx L antecubital: 2/2 Staph epi & Staph hominis  2/1 BCx R antecubital: 2/2 Staph epi 2/2 Sputum: not acceptable 2/3 rpt BCx: NGF 2/13 BCx: 2/13 TA:  2/13 MRSA PCR:    Thank you for allowing pharmacy to be a part of this patient's care.  Gretta Arab  PharmD, BCPS Clinical pharmacist phone 7am- 5pm: [phone] 09/09/2019 2:32 PM

## 2019-09-09 NOTE — Progress Notes (Signed)
This RN infomed MD of potassium redraw.  It has increased from 7.2 to 7.5 after treatment by night nurse. MD ordered kayexalate and IV calcium gluconate.

## 2019-09-09 NOTE — Procedures (Signed)
Intubation Procedure Note Michael [name] [id number] 01-14-1941  Procedure: Intubation Indications: Airway protection and maintenance  Procedure Details Consent: Unable to obtain consent because of emergent medical necessity. Time Out: Verified patient identification, verified procedure, site/side was marked, verified correct patient position, special equipment/implants available, medications/allergies/relevent history reviewed, required imaging and test results available.  Performed  Maximum sterile technique was used including cap, gloves, gown, hand hygiene and mask.  MAC and 3 glidescope    Evaluation Hemodynamic Status: pulses palpable throughout; O2 sats: stable throughout Patient's Current Condition: unstable Complications: No apparent complications Patient did tolerate procedure well. Chest X-ray ordered to verify placement.  CXR: pending.   [name] 09/09/2019

## 2019-09-09 NOTE — Consult Note (Signed)
NAME:  Michael [name], MRN:  [id number], DOB:  [birth year], LOS: 29 ADMISSION DATE:  09/07/2019, CONSULTATION DATE:  2/13 REFERRING MD:  Verlon Au, CHIEF COMPLAINT:  Hyperkalemia, encephalopathy   Brief History   covid 19 pneumonia AKI w/ hyperkalemia Progressive encephalopathy DVT BLE- heparin gtt  History of present illness   [name] is a 79 year old gentleman admitted to [facility] hospital on 2/1 with COVID-19 pneumonia.  He had upper respiratory cold-like symptoms, cough, shortness of breath, weakness prior to admission.  He had been eating and drinking minimally and not taking his meds.  At admission he was requiring 2 L supplemental oxygen.  Earlier in his admission he was has been treated for 7 days with cefazolin for staph epidermis bacteremia.  For the last several days he has had increasing oxygen requirements.  He has CKD 4 with an AKI and hyperkalemia.  Today his potassium rapidly rose to 7.2, repeated was 8.  He became increasingly encephalopathic and was unable to take p.o. medications.  A rapid response was called when he was nonresponsive.  Overnight he had received trazodone.  Past Medical History  DM2 Psoriasis AS HFpEF IDA HTN Hypothyroid C. Diff CKD 4 AVM  Significant Hospital Events   Upgrade to ICU, intubated 2/13  Consults:  Nephro  PCCM  Procedures:  Intubation 2/13 trialysis 2/13  Significant Diagnostic Tests:  Head CT 2/13: no acute bleeding CXR 2/13: progressive bilateral airspace disease CT chest 2/12: b/l diffuse GGOs, LLL posterior consolidation vs pleural thickening  Micro Data:  2/3 blood cx : NG, final 2/1 blood cx: staph epi (4/4) & staph hominis in 2/4 bottles  Antimicrobials:  remdesivir 2/1> 2/5 cefazolin 2/2>2/8  Interim history/subjective:    Objective   Blood pressure (!) 145/59, pulse 75, temperature 98 F (36.7 C), temperature source Oral, resp. rate 20, height 5' 11"  (1.803 m), weight 110.7 kg, SpO2 92 %.         Intake/Output Summary (Last 24 hours) at 09/09/2019 1341 Last data filed at 09/09/2019 4098 Gross per 24 hour  Intake 1121.21 ml  Output 1450 ml  Net -328.79 ml   Filed Weights   09/22/2019 1830  Weight: 110.7 kg    Examination: General: critically ill appearing elderly man laying in bed obtunded HENT: [place]/ATR, pupils midrange and symmetric, eyes anicteric. Oral mucosa moist, edentulous Lungs: symmetric breath sounds, rhonchi b/l Cardiovascular: RRR Abdomen: soft, NT, ND Extremities: no clubbing or cyanosis Neuro: +gag during intubation, otherwise not responsive or withdrawing from pain GU: external male catheter in place  Resolved Hospital Problem list     Assessment & Plan:   Acute encephalopathy- concern for possible ICH vs metabolic encephalopathy from meds + renal failure -STAT head CT -intubated for pending respiratory failure -empiric antibiotics -blood & sputum cultures  Acute hypoxic respiratory failure 2/2 covid 19 viral pneumonia, ARDS Pending respiratory failure from inability to protect airway -hyperventilation to manage hyperkalemia short-term -LTVV 4-8cc/kg IBW with goal Pplat <40 and driving pressure <11 once stable -daily SAT & SBT once stable -prone if P:F <150 -VAP prevention protocol  Hyperkalemia with VT, unstable bradyarrhythmias -shifting aggressively w/ insulin & D50, albuterol, bicarb, calcium IV, hyperventilation on MV -will need HD- wife ok with short-term dialysis, but previously had not wanted long-term dialysis -continuous telemetry -Nephrology consult- appreciate their assistance -renal US  Staph epidermis bactermia; follow up cx negative. -Previously received 1 week cefazolin -echo  Leukocytosis- unknown etiology.  -empiric antibiotics  Aortic stenosis, history of HFpEF Ventricular tachycardia  due to hyperkalemia -Maintain euvolemia -Appreciate nephrology's assistance with electrolyte management-- planning for CRRT  Guarded  prognosis with underlying CKD 4 and aortic stenosis, now complicated by vent dependent respiratory failure and renal failure requiring urgency dialysis.   Best practice:  Diet: NPO Pain/Anxiety/Delirium protocol (if indicated): yes VAP protocol (if indicated): yes DVT prophylaxis: heparin gtt GI prophylaxis: protonix Glucose control: Lantus, sliding scale insulin Mobility: bedrest Code Status: full Family Communication: updated [name]  Disposition: ICU  Labs   CBC: Recent Labs  Lab 09/05/19 0256 09/06/19 0222 09/07/19 0208 09/08/19 0033 09/09/19 0203  WBC 13.9* 15.1* 15.7* 15.2* 48.1*  HGB 9.8* 9.8* 9.8* 9.3* 11.2*  HCT 32.1* 32.8* 32.1* 31.1* 37.2*  MCV 88.7 88.6 87.7 89.4 90.7  PLT 188 163 145* 134* 811    Basic Metabolic Panel: Recent Labs  Lab 09/03/19 0352 09/04/19 0418 09/04/19 0418 09/05/19 0256 09/05/19 0256 09/06/19 0222 09/06/19 0222 09/07/19 0208 09/08/19 0033 09/09/19 0203 09/09/19 0900 09/09/19 1200  NA  --  138   < > 138  --  140  --  138 138 140  --   --   K  --  6.0*   < > 6.0*   < > 5.9*   < > 6.1* 5.7* 7.2* 7.5* 8.0*  CL  --  105   < > 105  --  106  --  104 105 106  --   --   CO2  --  24   < > 24  --  26  --  24 25 24   --   --   GLUCOSE  --  211*   < > 193*  --  145*  --  236* 240* 116*  --   --   BUN  --  85*   < > 89*  --  102*  --  113* 124* 113*  --   --   CREATININE  --  2.20*   < > 2.34*  --  2.33*  --  2.37* 2.49* 2.24*  --   --   CALCIUM  --  7.7*   < > 7.5*  --  7.4*  --  7.2* 7.1* 7.4*  --   --   MG 2.8* 2.9*  --  2.8*  --  3.0*  --  3.4*  --   --   --   --    < > = values in this interval not displayed.   GFR: Estimated Creatinine Clearance: 35.5 mL/min (A) (by C-G formula based on SCr of 2.24 mg/dL (H)). Recent Labs  Lab 09/05/19 0256 09/05/19 0256 09/06/19 0222 09/07/19 0208 09/08/19 0033 09/09/19 0203 09/09/19 1200  PROCALCITON 0.47  --   --   --   --   --   --   WBC 13.9*   < > 15.1* 15.7* 15.2* 48.1*  --     LATICACIDVEN  --   --   --   --   --   --  1.5   < > = values in this interval not displayed.    Liver Function Tests: No results for input(s): AST, ALT, ALKPHOS, BILITOT, PROT, ALBUMIN in the last 168 hours. No results for input(s): LIPASE, AMYLASE in the last 168 hours. No results for input(s): AMMONIA in the last 168 hours.  ABG    Component Value Date/Time   PHART 7.377 07/29/2016 1204   PCO2ART 38.7 07/29/2016 1204   PO2ART 74.0 (L) 07/29/2016 1204   HCO3  22.8 07/29/2016 1204   TCO2 24 06/27/2019 0028   ACIDBASEDEF 2.0 07/29/2016 1204   O2SAT 94.0 07/29/2016 1204     Coagulation Profile: No results for input(s): INR, PROTIME in the last 168 hours.  Cardiac Enzymes: No results for input(s): CKTOTAL, CKMB, CKMBINDEX, TROPONINI in the last 168 hours.  HbA1C: Hgb A1c MFr Bld  Date/Time Value Ref Range Status  09/13/2019 04:40 PM 5.7 (H) 4.8 - 5.6 % Final    Comment:    (NOTE) Pre diabetes:          5.7%-6.4% Diabetes:              >6.4% Glycemic control for   <7.0% adults with diabetes   04/20/2019 11:18 AM 5.9 4.6 - 6.5 % Final    Comment:    Glycemic Control Guidelines for People with Diabetes:Non Diabetic:  <6%Goal of Therapy: <7%Additional Action Suggested:  >8%     CBG: Recent Labs  Lab 09/08/19 1648 09/08/19 2021 09/09/19 0610 09/09/19 0725 09/09/19 1130  GLUCAP 193* 159* 270* 157* 176*    Review of Systems:   Unable to be obtained 2/2 mental status  Past Medical History  He,  has a past medical history of Acute on chronic diastolic (congestive) heart failure ([place]) (02/05/2017), Arthritis, AVM (arteriovenous malformation) of colon, AVM (arteriovenous malformation) of small bowel, acquired with hemorrhage (01/20/2012), Carotid artery disease ([place]), CKD (chronic kidney disease), stage III, Clostridium difficile colitis (05/13/2016), Coronary artery disease involving native coronary artery of native heart without angina pectoris (07/31/2016), GERD  (gastroesophageal reflux disease), Gout, H/O transfusion of whole blood, adenomatous colonic polyps (07/02/2016), Hyperlipidemia, Hypertension, Hypothyroidism, Iron deficiency anemia, Nonrheumatic aortic valve stenosis, NSTEMI (non-ST elevated myocardial infarction) ([place]), PAD (peripheral artery disease) ([place]) (07/31/2016), Personal history of colonic polyps (2007, 2008), Plaque psoriasis, Psoriasis, Septic phlebitis of upper extremity (08/26/2016), Staph infection (07/2016), Subclavian artery stenosis, right ([place]) (01/16/2015), and Type II diabetes mellitus ([place]).   Surgical History    Past Surgical History:  Procedure Laterality Date  . APPENDECTOMY  02/09/2014  . CARDIAC CATHETERIZATION N/A 07/29/2016   Procedure: Right/Left Heart Cath and Coronary Angiography;  Surgeon: Nelva Bush, MD;  Location: [place] [facility];  Service: Cardiovascular;  Laterality: N/A;  . CARDIAC CATHETERIZATION N/A 07/29/2016   Procedure: Coronary Stent Intervention;  Surgeon: Nelva Bush, MD;  Location: [facility];  Service: Cardiovascular;  Laterality: N/A;  . CARDIAC CATHETERIZATION  10/2014  . COLONOSCOPY  02/10/2011   internal hemorrhoids  . COLONOSCOPY W/ POLYPECTOMY  04/09/2006   12 mm adenoma  . COLONOSCOPY W/ POLYPECTOMY  06/17/2007   5 mm adenoma  . COLONOSCOPY WITH PROPOFOL N/A 05/13/2016   Procedure: COLONOSCOPY WITH PROPOFOL;  Surgeon: Manus Gunning, MD;  Location: [facility];  Service: Gastroenterology;  Laterality: N/A;  . ENTEROSCOPY N/A 08/14/2016   Procedure: ENTEROSCOPY;  Surgeon: Manus Gunning, MD;  Location: [facility] ENDOSCOPY;  Service: Gastroenterology;  Laterality: N/A;  . ESOPHAGOGASTRODUODENOSCOPY  12/23/2011   Procedure: ESOPHAGOGASTRODUODENOSCOPY (EGD);  Surgeon: Irene Shipper, MD;  Location: [facility];  Service: Endoscopy;  Laterality: N/A;  with small bowel bx's  . GIVENS CAPSULE STUDY  12/28/2011  . GIVENS CAPSULE STUDY N/A 08/14/2016   Procedure: GIVENS CAPSULE  STUDY;  Surgeon: Manus Gunning, MD;  Location: [place];  Service: Gastroenterology;  Laterality: N/A;  . KNEE ARTHROSCOPY Right   . LAPAROSCOPIC APPENDECTOMY N/A 02/09/2014   Procedure: APPENDECTOMY LAPAROSCOPIC;  Surgeon: Leighton Ruff, MD;  Location: [facility];  Service:  General;  Laterality: N/A;  . LEFT HEART CATHETERIZATION WITH CORONARY ANGIOGRAM N/A 11/15/2014   Procedure: LEFT HEART CATHETERIZATION WITH CORONARY ANGIOGRAM;  Surgeon: Belva Crome, MD;  Location: [facility] LAB;  Service: Cardiovascular;  Laterality: N/A;  . SHOULDER OPEN ROTATOR CUFF REPAIR Left      Social History   reports that he quit smoking about 13 years ago. His smoking use included cigarettes. He has a 33.60 pack-year smoking history. He has never used smokeless tobacco. He reports current alcohol use of about 1.0 standard drinks of alcohol per week. He reports that he does not use drugs.   Family History   His family history includes COPD in his sister; Cancer in his brother; Diabetes in his father; Heart attack in his father; Heart disease in his brother; Hypertension in his father and mother; Lung cancer in his father; Stroke in his father and mother. There is no history of Malignant hyperthermia.   Allergies Allergies  Allergen Reactions  . Percocet [Oxycodone-Acetaminophen] Nausea And Vomiting  . Ciprofloxacin Other (See Comments)    Light headed and made stagger  . Metformin And Related Nausea Only    Upset stomach  . Invokana [Canagliflozin] Rash     Home Medications  Prior to Admission medications   Medication Sig Start Date End Date Taking? Authorizing Provider  allopurinol (ZYLOPRIM) 100 MG tablet Take 2 tablets (226m total) by mouth daily Patient taking differently: Take 200 mg by mouth daily. Take 2 tablets (2039mtotal) by mouth daily 05/09/19  Yes [name], AlEvie LacksMD  amiodarone (PACERONE) 200 MG tablet TAKE 1 TABLET BY MOUTH ONCE DAILY EXCEPT  FOR  SUNDAY Patient taking  differently: Take 200 mg by mouth See admin instructions. Take daily EXCEPT: Sunday 05/09/19  Yes NiJosue HectorMD  aspirin 81 MG chewable tablet Chew 1 tablet (81 mg total) by mouth daily. 04/28/17  Yes [name], AlEvie LacksMD  atorvastatin (LIPITOR) 40 MG tablet Take 1 tablet (40 mg total) by mouth daily. Patient taking differently: Take 40 mg by mouth daily at 6 PM.  08/21/19  Yes [name], AlEvie LacksMD  b complex vitamins tablet Take 1 tablet by mouth daily. 04/14/17  Yes [name], AlEvie LacksMD  CARTIA XT 180 MG 24 hr capsule Take 1 capsule by mouth once daily Patient taking differently: Take 180 mg by mouth daily.  06/06/19  Yes NiJosue HectorMD  Cholecalciferol (VITAMIN D-3) 1000 units CAPS Take 1,000-5,000 Units by mouth See admin instructions. 1,000 units once a day on Sun/Mon/Tues/Wed/Thurs/Fri and 5,000 units on Sat   Yes [provider]  fenofibrate 160 MG tablet Take 1 tablet by mouth daily Patient taking differently: Take 160 mg by mouth daily.  06/15/19  Yes NiJosue HectorMD  ferrous sulfate 325 (65 FE) MG tablet Take 325 mg by mouth 3 (three) times daily with meals.   Yes [provider]  furosemide (LASIX) 40 MG tablet Take 1 tablet (40 mg total) by mouth 2 (two) times daily. 06/06/19  Yes NiJosue HectorMD  gabapentin (NEURONTIN) 300 MG capsule Take 1 capsule (300 mg total) by mouth 3 (three) times daily. 04/20/19  Yes [name], AlEvie LacksMD  halobetasol (ULTRAVATE) 0.05 % cream Apply 1 application topically 2 (two) times daily. 07/15/19  Yes [provider]  hydrALAZINE (APRESOLINE) 50 MG tablet Take 1 tablet (50 mg total) by mouth 3 (three) times daily. 08/21/19  Yes [name], AlEvie LacksMD  Insulin Isophane &  Regular Human (NOVOLIN 70/30 FLEXPEN RELION) (70-30) 100 UNIT/ML PEN Inject 50 Units into the skin 2 (two) times daily with a meal. Patient taking differently: Inject 50 Units into the skin 2 (two) times daily.  05/10/19  Yes  [name], Evie Lacks, MD  levothyroxine (SYNTHROID) 150 MCG tablet Take 1 tablet (150 mcg total) by mouth daily before breakfast. 06/15/19  Yes [name], Evie Lacks, MD  lisinopril (ZESTRIL) 5 MG tablet Take 5 mg by mouth at bedtime. 06/13/19  Yes [provider]  metoprolol tartrate (LOPRESSOR) 25 MG tablet Take 0.5 tablets (12.5 mg total) by mouth 2 (two) times daily. 02/01/19  Yes Isaiah Serge, NP  nitroGLYCERIN (NITROSTAT) 0.4 MG SL tablet Place 1 tablet under tongue for chest pain. Repeat every 5 minutes as needed, up to 3 doses. If no relief after 3 doses, call 911. Patient taking differently: Place 0.4 mg under the tongue every 5 (five) minutes as needed for chest pain.  03/18/18  Yes Josue Hector, MD  Secukinumab (COSENTYX) 150 MG/ML SOSY Inject 150 mg into the skin every 30 (thirty) days. Inject 318m (2 pens) subcutaneously every 4 weeks    Yes [provider]  Blood Glucose Monitoring Suppl (ONE TOUCH ULTRA 2) W/DEVICE KIT Use as directed Dx E11.9 05/18/14   [name], AEvie Lacks MD  Blood Pressure Monitor KIT Use to check blood pressure daily Dx I10 10/04/15   [name], AEvie Lacks MD  glucose blood (ONETOUCH ULTRA) test strip CHECK BLOOD GLUCOSE (SUGAR) 4 TIMES DAILY DX: E11.9, Z79.4 (pt on insulin) 12/28/18   [name], AEvie Lacks MD  Insulin Pen Needle 32G X 4 MM MISC Use to administer insulin 09/16/18   [name], AEvie Lacks MD  Lancets (ONETOUCH ULTRASOFT) lancets Use to check blood sugars daily 05/18/14   [name], AEvie Lacks MD  NEEDLE, DISP, 30 G (BD DISP NEEDLES) 30G X 1/2" MISC USE WITH 1 SYRINGE SUBCUTANEOUSLY  TWICE DAILY 09/15/18   [name], AEvie Lacks MD     This patient is critically ill with multiple organ system failure which requires frequent high complexity decision making, assessment, support, evaluation, and titration of therapies. This was completed through the application of advanced monitoring technologies and extensive interpretation of multiple  databases. During this encounter critical care time was devoted to patient care services described in this note for 65 minutes.  LJulian Hy DO 09/09/19 4:00 PM [facility]

## 2019-09-09 NOTE — Progress Notes (Signed)
I called patient's wife and explained change in status and that patient was transferred to ICU and needed to be intubated Based on my discussion with her earlier today she did not think that patient would want dialysis however after discussing situation and need for temporizing measures and may be temporary dialysis she is now amenable to the same I have spoken with Dr. Jonnie Finner of nephrology who will see the patient in consult and assist with the same Note that elevated white count of 48 and slightly elevated lactic acid 1.5 may be indicative of some underlying other septic cause CT head to be done may require further imaging to rule out other sources of ischemia  Verneita Griffes, MD [facility] 2:03 PM

## 2019-09-09 NOTE — Consult Note (Addendum)
Renal Service Consult Note [facility]  Michael [name] 09/09/2019 [place] Requesting Physician:  Dr Carlis Abbott, CCM  Reason for Consult:  Renal failure, Michael [name] HPI: The patient is a 79 y.o. year-old w/ hx of DM 2, PAD, CAD, HTN, afib not on a/c due to GIB, HL, CKD 3 and diast CHF presented w/ URI symptoms on 2/01. In ED CXR showed multifocal infiltrates and +COVID test. Admitted to [facility] then tx'd to [facility] for worsening oxygenation.  Course c/b R LE DVT, coag neg staph sepsis, ^K+, hemorrhoidal bleed. Overnight had resp distress and was intubated. Then pt coded w/ wide complex tachycardia, didn't lose pulses.  Was given acute IV Rx for hyperkalemia w/ IV glu/ insulin/ Ca++ and IV bicarb x 3 w/ narrowing of the QRS.  Asked to see for renal failure.   Patient's baseline creat is 1.7- 2.1 from Feb to Dec 2020 Creat here 2.34 on 2/1 , has been stable in 2.1- 2.5 range here  BUN 55 on admit 2/1, now up to 124 K 4.4 on admit 2/1, up to 6.0 on 2/7 , overnight jumped up from 5.7 to 7.5 and higher.    UA not done here, all prior UA's (2011, 2014, 2015 were negative)   Renal US 2/3 showed normal 13cm kidneys w/o hydro, mild cort thinning  Patient is on vent, sedated, no hx obtained ROS n/a    Past Medical History  Past Medical History:  Diagnosis Date  . Acute on chronic diastolic (congestive) heart failure ([place]) 02/05/2017  . Arthritis   . AVM (arteriovenous malformation) of colon   . AVM (arteriovenous malformation) of small bowel, acquired with hemorrhage 01/20/2012   Found on small bowel capsule endoscopy, June 2013   . Carotid artery disease ([place])    a. Carotid dopple 03/2014 41-63% RICA &  84-53% LICA stenosis b. 6/46  . CKD (chronic kidney disease), stage III   . Clostridium difficile colitis 05/13/2016  . Coronary artery disease involving native coronary artery of native heart without angina pectoris 07/31/2016   Lopressor - d/c 7/19, Lipitor and ASA  . GERD  (gastroesophageal reflux disease)   . Gout   . H/O transfusion of whole blood   . Hx of adenomatous colonic polyps 07/02/2016  . Hyperlipidemia   . Hypertension   . Hypothyroidism   . Iron deficiency anemia   . Nonrheumatic aortic valve stenosis   . NSTEMI (non-ST elevated myocardial infarction) ([place])    hx/notes 06/25/2017  . PAD (peripheral artery disease) ([place]) 07/31/2016   On ASA, Plavix  . Personal history of colonic polyps 2007, 2008   adenoma each time, largest 12 mm in 2007  . Plaque psoriasis   . Psoriasis   . Septic phlebitis of upper extremity 08/26/2016   2018 L hand Dr Megan Salon f/u Keflex and Vanc po  . Staph infection 07/2016   left hand   . Subclavian artery stenosis, right ([place]) 01/16/2015  . Type II diabetes mellitus ([place])    TYPE 2   Past Surgical History  Past Surgical History:  Procedure Laterality Date  . APPENDECTOMY  02/09/2014  . CARDIAC CATHETERIZATION N/A 07/29/2016   Procedure: Right/Left Heart Cath and Coronary Angiography;  Surgeon: Nelva Bush, MD;  Location: [place] [facility];  Service: Cardiovascular;  Laterality: N/A;  . CARDIAC CATHETERIZATION N/A 07/29/2016   Procedure: Coronary Stent Intervention;  Surgeon: Nelva Bush, MD;  Location: [place] [facility];  Service: Cardiovascular;  Laterality: N/A;  . CARDIAC  CATHETERIZATION  10/2014  . COLONOSCOPY  02/10/2011   internal hemorrhoids  . COLONOSCOPY W/ POLYPECTOMY  04/09/2006   12 mm adenoma  . COLONOSCOPY W/ POLYPECTOMY  06/17/2007   5 mm adenoma  . COLONOSCOPY WITH PROPOFOL N/A 05/13/2016   Procedure: COLONOSCOPY WITH PROPOFOL;  Surgeon: Manus Gunning, MD;  Location: [facility]COPY;  Service: Gastroenterology;  Laterality: N/A;  . ENTEROSCOPY N/A 08/14/2016   Procedure: ENTEROSCOPY;  Surgeon: Manus Gunning, MD;  Location[facility] ENDOSCOPY;  Service: Gastroenterology;  Laterality: N/A;  . ESOPHAGOGASTRODUODENOSCOPY  12/23/2011   Procedure: ESOPHAGOGASTRODUODENOSCOPY (EGD);   Surgeon: Irene Shipper, MD;  Location: [facility];  Service: Endoscopy;  Laterality: N/A;  with small bowel bx's  . GIVENS CAPSULE STUDY  12/28/2011  . GIVENS CAPSULE STUDY N/A 08/14/2016   Procedure: GIVENS CAPSULE STUDY;  Surgeon: Manus Gunning, MD;  Location: [place];  Service: Gastroenterology;  Laterality: N/A;  . KNEE ARTHROSCOPY Right   . LAPAROSCOPIC APPENDECTOMY N/A 02/09/2014   Procedure: APPENDECTOMY LAPAROSCOPIC;  Surgeon: Leighton Ruff, MD;  Location: [facility]S;  Service: General;  Laterality: N/A;  . LEFT HEART CATHETERIZATION WITH CORONARY ANGIOGRAM N/A 11/15/2014   Procedure: LEFT HEART CATHETERIZATION WITH CORONARY ANGIOGRAM;  Surgeon: Belva Crome, MD;  Location: [facility] CATH LAB;  Service: Cardiovascular;  Laterality: N/A;  . SHOULDER OPEN ROTATOR CUFF REPAIR Left    Family History  Family History  Problem Relation Age of Onset  . Heart attack Father   . Lung cancer Father   . Diabetes Father   . Stroke Father   . Hypertension Father   . Stroke Mother   . Hypertension Mother   . Cancer Brother        liver  . Heart disease Brother        chf  . COPD Sister   . Malignant hyperthermia Neg Hx    Social History  reports that he quit smoking about 13 years ago. His smoking use included cigarettes. He has a 33.60 pack-year smoking history. He has never used smokeless tobacco. He reports current alcohol use of about 1.0 standard drinks of alcohol per week. He reports that he does not use drugs. Allergies  Allergies  Allergen Reactions  . Percocet [Oxycodone-Acetaminophen] Nausea And Vomiting  . Ciprofloxacin Other (See Comments)    Light headed and made stagger  . Metformin And Related Nausea Only    Upset stomach  . Invokana [Canagliflozin] Rash   Home medications Prior to Admission medications   Medication Sig Start Date End Date Taking? Authorizing Provider  allopurinol (ZYLOPRIM) 100 MG tablet Take 2 tablets (275m total) by mouth daily Patient taking  differently: Take 200 mg by mouth daily. Take 2 tablets (2057mtotal) by mouth daily 05/09/19  Yes [name], AlEvie LacksMD  amiodarone (PACERONE) 200 MG tablet TAKE 1 TABLET BY MOUTH ONCE DAILY EXCEPT  FOR  SUNDAY Patient taking differently: Take 200 mg by mouth See admin instructions. Take daily EXCEPT: Sunday 05/09/19  Yes NiJosue HectorMD  aspirin 81 MG chewable tablet Chew 1 tablet (81 mg total) by mouth daily. 04/28/17  Yes [name], AlEvie LacksMD  atorvastatin (LIPITOR) 40 MG tablet Take 1 tablet (40 mg total) by mouth daily. Patient taking differently: Take 40 mg by mouth daily at 6 PM.  08/21/19  Yes [name], AlEvie LacksMD  b complex vitamins tablet Take 1 tablet by mouth daily. 04/14/17  Yes [name], AlEvie LacksMD  CARTIA XT 180 MG 24 hr  capsule Take 1 capsule by mouth once daily Patient taking differently: Take 180 mg by mouth daily.  06/06/19  Yes Josue Hector, MD  Cholecalciferol (VITAMIN D-3) 1000 units CAPS Take 1,000-5,000 Units by mouth See admin instructions. 1,000 units once a day on Sun/Mon/Tues/Wed/Thurs/Fri and 5,000 units on Sat   Yes [provider]  fenofibrate 160 MG tablet Take 1 tablet by mouth daily Patient taking differently: Take 160 mg by mouth daily.  06/15/19  Yes Josue Hector, MD  ferrous sulfate 325 (65 FE) MG tablet Take 325 mg by mouth 3 (three) times daily with meals.   Yes [provider]  furosemide (LASIX) 40 MG tablet Take 1 tablet (40 mg total) by mouth 2 (two) times daily. 06/06/19  Yes Josue Hector, MD  gabapentin (NEURONTIN) 300 MG capsule Take 1 capsule (300 mg total) by mouth 3 (three) times daily. 04/20/19  Yes [name], Evie Lacks, MD  halobetasol (ULTRAVATE) 0.05 % cream Apply 1 application topically 2 (two) times daily. 07/15/19  Yes [provider]  hydrALAZINE (APRESOLINE) 50 MG tablet Take 1 tablet (50 mg total) by mouth 3 (three) times daily. 08/21/19  Yes [name], Evie Lacks, MD  Insulin Isophane &  Regular Human (NOVOLIN 70/30 FLEXPEN RELION) (70-30) 100 UNIT/ML PEN Inject 50 Units into the skin 2 (two) times daily with a meal. Patient taking differently: Inject 50 Units into the skin 2 (two) times daily.  05/10/19  Yes [name], Evie Lacks, MD  levothyroxine (SYNTHROID) 150 MCG tablet Take 1 tablet (150 mcg total) by mouth daily before breakfast. 06/15/19  Yes [name], Evie Lacks, MD  lisinopril (ZESTRIL) 5 MG tablet Take 5 mg by mouth at bedtime. 06/13/19  Yes [provider]  metoprolol tartrate (LOPRESSOR) 25 MG tablet Take 0.5 tablets (12.5 mg total) by mouth 2 (two) times daily. 02/01/19  Yes Isaiah Serge, NP  nitroGLYCERIN (NITROSTAT) 0.4 MG SL tablet Place 1 tablet under tongue for chest pain. Repeat every 5 minutes as needed, up to 3 doses. If no relief after 3 doses, call 911. Patient taking differently: Place 0.4 mg under the tongue every 5 (five) minutes as needed for chest pain.  03/18/18  Yes Josue Hector, MD  Secukinumab (COSENTYX) 150 MG/ML SOSY Inject 150 mg into the skin every 30 (thirty) days. Inject 364m (2 pens) subcutaneously every 4 weeks    Yes [provider]  Blood Glucose Monitoring Suppl (ONE TOUCH ULTRA 2) W/DEVICE KIT Use as directed Dx E11.9 05/18/14   [name], AEvie Lacks MD  Blood Pressure Monitor KIT Use to check blood pressure daily Dx I10 10/04/15   [name], AEvie Lacks MD  glucose blood (ONETOUCH ULTRA) test strip CHECK BLOOD GLUCOSE (SUGAR) 4 TIMES DAILY DX: E11.9, Z79.4 (pt on insulin) 12/28/18   [name], AEvie Lacks MD  Insulin Pen Needle 32G X 4 MM MISC Use to administer insulin 09/16/18   [name], AEvie Lacks MD  Lancets (ONETOUCH ULTRASOFT) lancets Use to check blood sugars daily 05/18/14   [name], AEvie Lacks MD  NEEDLE, DISP, 30 G (BD DISP NEEDLES) 30G X 1/2" MISC USE WITH 1 SYRINGE SUBCUTANEOUSLY  TWICE DAILY 09/15/18   [name], AEvie Lacks MD     Vitals:   09/09/19 0609 09/09/19 0700 09/09/19 0800 09/09/19 1413  BP:    (!) 145/59   Pulse: 74 76 75 83  Resp: (!) 22 19 20  (!) 25  Temp:   98 F (36.7 C)   TempSrc:   Oral  SpO2: 92% 91% 92% 100%  Weight:      Height:    5' 11"  (1.803 m)   Exam Gen elderly WM on vent, moving about, not following commands No rash, cyanosis or gangrene Sclera anicteric, throat w ETT  No jvd or bruits Chest coarse BS bilat, no wheezing RRR no MRG Abd soft ntnd no mass or ascites +bs GU normal male MS no joint effusions or deformity Ext 1+ bilat pretib edema, no wounds or ulcers Neuro is sedated on vent R IJ temp cath in place    Home meds:  - diltiazem xt 180 qd/furosemide 40 bid/ hydralazine 50 tid/ lisinopril 5 hs/ metoprolol 12.5 bid  - sl ntg prn/ atorvastatin 40/ aspirin 81/ fenofibrate 160   - amiodarone 200 qd  - insulin 70/30 50u bid  - levothyroxine 150 qd/ gabapentin 300 tid/ allopurinol 200 qd  - secukinumab 157m q 30d sq  Patient's baseline creat is 1.7- 2.1 from Feb to Dec 2020  eGFR in 2020 was 23- 38 ml/min , stage III/ IV Creat here 2.34 on 2/1 , has been stable in 2.1- 2.5 range here  BUN 55 on admit 2/1, now up to 124 K 4.4 on admit 2/1, up to 6.0 on 2/7 , overnight jumped up from 5.7 to 7.5 and higher.    UA not done here, all prior UA's (2011, 2014, 2015 were negative)   Renal [place]3 showed normal 13cm kidneys w/o hydro, mild cort thinning   CXR 2/13 - bilat diffuse infiltrates, no chg    I/O total = 10.8L in and 12.5 L out    Wt's - 2/01 = 110.7 kg, no other wts   Assessment/ Plan: 1. AoCKD 3 - baseline creat 1.7- 2.1 in 2020. Creat here 2.1 - 2.5 range. BUN up from steroids/ catabolic state. Making urine but will need acute CRRT due to severe hyperkalemia.   2. Hyperkalemia - w/ wide complex QRS. Not sure cause, could be acute necrotizing process, acute resp acidosis, other. Plan CRRT w/ zero K dialysate for now. Has had acute IV Rx per CCM.   3. COVID +PNA - admitted 08/28/19, on vent 100% fiO2/ per CCM 4. HTN/ vol - BP's normal to  high, no shock. On mult home bp meds as above. Total I/O are not positive, IJ during line placement per CCM was not dilated. Min UF 50/hr.  5. DM2 - on insulin 6. Atrial fib - not on a/c due to hx GIB 7. CAD      Rob Tully Burgo  MD 09/09/2019, 3:04 PM  Recent Labs  Lab 09/08/19 0033 09/09/19 0203  WBC 15.2* 48.1*  HGB 9.3* 11.2*   Recent Labs  Lab 09/04/19 0418 09/04/19 0418 09/05/19 0256 09/05/19 0256 09/06/19 0222 09/06/19 0222 09/07/19 0208 09/07/19 0208 09/08/19 0033 09/08/19 0033 09/09/19 0203 09/09/19 0203 09/09/19 0900 09/09/19 1200  K 6.0*   < > 6.0*   < > 5.9*   < > 6.1*   < > 5.7*   < > 7.2*   < > 7.5* >7.5*  8.0*  BUN 85*   < > 89*   < > 102*   < > 113*   < > 124*   < > 113*  --   --  124*  CREATININE 2.20*   < > 2.34*   < > 2.33*   < > 2.37*   < > 2.49*   < > 2.24*  --   --  2.78*  CALCIUM  7.7*  --  7.5*  --  7.4*  --  7.2*  --  7.1*  --  7.4*  --   --  7.3*   < > = values in this interval not displayed.

## 2019-09-09 NOTE — Progress Notes (Signed)
eLink Physician-Brief Progress Note Patient Name: JUSHUA WALTMAN DOB: 1941-04-05 MRN: [id number]   Date of Service  09/09/2019  HPI/Events of Note  Hypotension - BP = 72/39.   eICU Interventions  Will order: 1. Phenylephrine IV infusion. Titrate to MAP >= 65.      Intervention Category Major Interventions: Hypotension - evaluation and management  Lysle Dingwall 09/09/2019, 8:44 PM

## 2019-09-09 NOTE — Progress Notes (Signed)
This RN asked Dr. Verlon Au if he wanted me to hold the insulin due to patient not eating. Order received to hold the fast acting and not the long acting. Patients blood sugar was 154 this morning and was 174 after no coverage around 11am.

## 2019-09-09 NOTE — Progress Notes (Signed)
Patient evaluated for acute respiratory distress, diffuse rales noted b/l. He had been getting gentle IV hydration d/t progressively worsening azotemia, but with acute worsening in respiratory status, IVF was stopped and he was given one time dose IV Lasix.

## 2019-09-09 NOTE — Procedures (Signed)
Hemodialysis Catheter Insertion Procedure Note Michael [name] [id number] 07/28/1940  Procedure: Insertion of Hemodialysis Catheter Indications: Dialysis Access   Procedure Details Consent: obtained from Michael [name] over the phone Time Out: Verified patient identification, verified procedure, site/side was marked, verified correct patient position, special equipment/implants available, medications/allergies/relevent history reviewed, required imaging and test results available.  Performed  Maximum sterile technique was used including antiseptics, cap, gloves, gown, hand hygiene, mask and sheet. Skin prep: Chlorhexidine; local anesthetic administered Triple lumen hemodialysis catheter was inserted into right internal jugular vein using the Seldinger technique.  Evaluation Blood flow good Complications: No apparent complications Patient did tolerate procedure well. Chest X-ray ordered to verify placement.  CXR: normal.   Michael [name] 09/09/2019

## 2019-09-09 NOTE — Progress Notes (Signed)
CRITICAL VALUE ALERT  Critical Value:  Potassium >7.5  Date & Time Notied:  09/09/19 @ 1430  Provider Notified: Dr. Carlis Abbott aware.   Orders Received/Actions taken: See MAR.

## 2019-09-09 NOTE — Progress Notes (Signed)
This RN in room assessing patient and notified DR. Samtani via secured chat of patients change.  Patient is only responding to pain. Has some incomprehensible speech and lungs are coarse throughout.  Patients o2 sats are in the 90's but he is labored.  Dr. Verlon Au reports he will be in room soon to assess.

## 2019-09-09 NOTE — Code Documentation (Signed)
Patient unresponsive brought to ICU Bradycardic, wide complex rhythm began upon arrival K+ 8- had received rectal kayexalate & 1g Ca+ previously  Never lost pulses.  3 amps of bicarb IV 1 g CaCl IV 1 puff combivent 5 units insulin + D50W  Intubated without need for sedation. + gag reflex  Hyperventilated on vent,  10mg  albuterol neb in process  Rhythm went to wide compex with HR ~90, still with bounding pulses Rhythm narrow complex, HR 61 currently.  STAT head CT, then will place dialysis catheter.  Repeat BMP ordered.  [name], DO 09/09/19 1:39 PM [facility]

## 2019-09-09 NOTE — Progress Notes (Signed)
Rapid Response Event Note  Overview:   Bedside nurse reports patient is unresponsive and oxygen requirements increasing. Patient potassium level 8.   Initial Focused Assessment: Patient unresponsive to painful stimulus on assessment, on NRB with 15L HFNC.   Interventions:  Patient transferred to ICU. While transporting patient to ICU, patient became bradycardic and received 3 amps of sodium bicarb, an amp of calcium chloride, and an amp of atropine. Patient then intubated, stabilized, and transported to CT for STAT CT of the head.   Michael [name]

## 2019-09-09 NOTE — Progress Notes (Signed)
This RN in room assessing patient. Patient is on 15L high flo and 15L NRB as he has been all morning.  Patient seems to be more labored with audible crackles from upper airway. O2 saturation 84%. This RN repositioned patient in bed and tried an o2 monitor on a different finger which read 82%.  This RN set up bedside suction. Was able to suction a moderate amount of thick tan mucous from back of throat. No gag reflex noted at this time.  MD notified.  Dr. Pilar Jarvis orders heated high flow. This RN discussed with Camera operator and was informed the patient would need to be moved to central unit to administer heated high flo.  Md notified of this. This RN called Thurmond Butts on central unit and informed him of patients presentation.  Thurmond Butts suggested this patient be moved to ICU. Charge nurse notified.  Md notified.  ICU nurse soon arrived at bedside.  She assessed the patient and called ICU staff to obtain a room. Patient now sating 89%.  Not responsive. BP 929'V systolic.  This RN along with RT therapy, ICU charge and ICU RN transferred this patient to ICU.  Heparin was infusing enroute but suffered damage maneuvering through the elevator doors and caused a hole in the tubing.  Heparin d/c'd upon arrival to ICU as it was leaking from above the alaris pump.  Patients HR on monitor dipped into the 40's.  ICU intensivistt at bedside followed by Dr. Beryle Lathe shortly after.  Crash cart opened and meds pushed. Please see ICU flowsheet for meds administered.  Patient intubated shortly after.  MD reported he notified wife.  Care transferred to ICU staff.

## 2019-09-09 NOTE — Progress Notes (Signed)
ANTICOAGULATION CONSULT NOTE - Initial Consult  Pharmacy Consult for IV Heparin  Indication:  VTE treatment + RLE DVT  Allergies  Allergen Reactions  . Percocet [Oxycodone-Acetaminophen] Nausea And Vomiting  . Ciprofloxacin Other (See Comments)    Light headed and made stagger  . Metformin And Related Nausea Only    Upset stomach  . Invokana [Canagliflozin] Rash    Patient Measurements: Height: 5\' 11"  (180.3 cm) Weight: 244 lb 0.8 oz (110.7 kg) IBW/kg (Calculated) : 75.3 Heparin Dosing Weight:  99 kg  Vital Signs: Temp: 98.3 F (36.8 C) (02/13 0300) Temp Source: Oral (02/13 0300) BP: 159/85 (02/13 0300) Pulse Rate: 76 (02/13 0300)  Labs: Recent Labs    09/07/19 0208 09/07/19 0208 09/08/19 0033 09/09/19 0203  HGB 9.8*   < > 9.3* 11.2*  HCT 32.1*  --  31.1* 37.2*  PLT 145*  --  134* 259  HEPARINUNFRC  --   --   --  0.45  CREATININE 2.37*  --  2.49*  --    < > = values in this interval not displayed.    Estimated Creatinine Clearance: 32 mL/min (A) (by C-G formula based on SCr of 2.49 mg/dL (H)).  Assessment: 79 y.o male with h/o CKD 3, HTN, HFpEF, paroxysmal A. Fib not on anticoagulation PTA due to prior GI bleed, psoriasis, small beowe AVM 2018 with coincidental hemorrhoidal etiology in the past.  Admitted 09/07/2019 for coronavirus 19 pneumonia.  Noted Hemorrhoidal bleeding.  GI evaluated patient on 08/29/2019 and deferred colonoscopy given respiratory status.    LE dopplers + for DVT right peroneal left Tibial vein.  Pharmacy consulted to start IV heparin vte treatment dose. MD plans to eventually transition to oral NOAC  Hgb 9.8>9.3, pltc 145>134, (admit Hgb 8.6 and pltc 224 on 09/24/2019) He was receiving SQ heparin 7500 units q8h,  last given 09/08/19 @  0507 No bleeding currently per RN SCr 2.49,  H/o CKC stage 3  09/09/19 0415 Heparin level: 0.45 IU/mL, within therapeutic goal range on heparin at 1550 units/hr RN reports no bleeding complications or issues with  infusion site CBC:  Hb has increased to 11.2mg /dL; platelets have recovered  to 259K  Goal of Therapy:  Heparin level 0.3-0.7 units/ml Monitor platelets by anticoagulation protocol: Yes   Plan:   Continue  heparin drip rate at 1550 units/hr  Obtain heparin level in ~8hrs to confirm Daily HL and CBC while on heparin Monitor for signs and symptoms of bleeding  Despina Pole, Pharm. D. Clinical Pharmacist 09/09/2019 4:15 AM

## 2019-09-09 NOTE — Progress Notes (Signed)
Hospitalist progress note   Patient from home, Patient going unclear, Dispo pending clinical improvement--looking  Michael [name] [id number] DOB: 1940-11-27 DOA: 09/05/2019  PCP: Cassandria Anger, MD   Narrative:   59 white male CKD 3, HTN, HFpEF, paroxysmal A. fib not on anticoagulation due to prior GI bleed CAD cath 2018 NSTEMI, psoriasis, small bowel AVM 2018 with coincidental hemorrhoidal etiology in the past Initially was admitted at [facility] however because of worsening oxygenation status transferred over to [place] evaluated the patient 2/1 and deferred colonoscopy given respiratory status and he is being treated empirically for hemorrhoidal bleed He has required hi flow nasal oxygen 15 liters with difficulty weaning--empircally has trialed lasix but worsening azotemia precluded this Ct scan chest 2/12 didn't show fluid showed changes assosciated with COVID  Data Reviewed:  COVID-19 Labs  Recent Labs    09/07/19 0208 09/08/19 0033 09/09/19 0203  DDIMER  --  2.20*  --   FERRITIN 1,741* 1,543* 2,101*  CRP 3.5* 1.7* 5.0*    Lab Results  Component Value Date   SARSCOV2NAA POSITIVE (A) 08/30/2019   [place] NEGATIVE 01/31/2019   [place] NEGATIVE 01/04/2019   K 5.9--->6.1-->5.7-->7.2  BUn/Creat 113/2.37--->124/2.49-->113/2.2 Calcium 7.4 WBC 48, platelet count 259, hemoglobin 11 point CXR personally reviewed shows less count of intercostal spaces compared to 2/8 CT chest shows predominant interstitial airspace diffuse pna  LE dopplers + for DVT  Assessment & Plan: Toxic metabolic encephalopathy Confused this morning likely result of use of trazodone superimposed on renal dysfunction in addition to underlying use of gabapentin and poor clearance with renal function Stop all sedatives Restraints okay now but cannot use telemetry sitter based on how he does I will reassess him if needed later on today Significant leukocytosis White count up to 48  -usually 15-18 range not known aspirated We will cut back steroids as he is probably receiving benefit of high-dose steroids over the past 7 days and I am down titrating to 75 CXr this am unchanged Rpt all labs this pm ~ 1400 Multifocal coronavirus 19 pneumonia without improvement Remains on 15 L nonrebreather completed remdesivir 2/5 and continues on steroids since 2/1 Not a candidate for Actemra given being treated with Cosentyx for psoriasis Stop fluids, x-ray as above, as needed Lasix DVT R peroneal left Tibial vn Starting weight based heparin for DVt Rx-eventuall transition to NOAC when creatinine a little more stable Coag negative staph bacteremia Evaluated earlier by infectious disease completed Ancef 08/31/2019 Prior history of AVM 2017,?  Hemorrhoidal bleeding on admission Hemoglobin has been stable-GI evaluated 2/1 and will follow as outpatient with no role at this time for colonoscopy Monitor carefully trends of hemoglobin Hyperkalemia AKI superimposed on CKD 3 A. persistently elevated potassium-give rectal kayexalate, calcium gluc, albuterol-give lasix iv as well May need nephrology input B. Minimize nephrotoxins-use Lasix on a as needed basis Paroxysmal A. fib CHADS2 score >3 elevated has bled score Continue amiodarone 200, Cardizem 180, metoprolol 12.5 twice daily--ok to continue meds despite mild bradycadia HFpEF- Meds as above periodic CXR I/o-1.69 DM TY 2 with neuropathy-A1c 5.7 CBG range 150-270 ? Lantus 32-hold SSI today Stop for now gabapentin dysfunction Prior history of CAD Last cath 07/2016 PCI Synergy DES with triple therapy Currently quiesced sent meds as above History of psoriasis Previously on Cosentyx  Called wife [phone] -explained events overnight carefully as well as explained is sicker than prior--still remains full code--she tells me he would not have wanted to have dialysis--will have to  reassess later--am hopeful to avaoid the  same   Subjective:  Overnight events noted quite agitated needed restraints difficult to arouse this morning oxygenation seems fine Per nursing he appears less swollen to me he says about the same Cannot get much ROS out of  Consultants:   Non currently  Objective: Vitals:   09/09/19 0300 09/09/19 0609 09/09/19 0700 09/09/19 0800  BP: (!) 159/85   (!) 145/59  Pulse: 76 74 76 75  Resp: (!) 23 (!) 22 19 20   Temp: 98.3 F (36.8 C)   98 F (36.7 C)  TempSrc: Oral   Oral  SpO2: 90% 92% 91% 92%  Weight:      Height:        Intake/Output Summary (Last 24 hours) at 09/09/2019 0900 Last data filed at 09/09/2019 9373 Gross per 24 hour  Intake 1481.21 ml  Output 2150 ml  Net -668.79 ml   Filed Weights   09/10/2019 1830  Weight: 110.7 kg    Examination:  quite sleepy not waking up EOMI NCAT Chest clear Mild lower extremity edema Abdomen soft no rebound   Scheduled Meds: . allopurinol  200 mg Oral Daily  . amiodarone  200 mg Oral Once per day on Mon Tue Wed Thu Fri Sat  . vitamin C  500 mg Oral Daily  . atorvastatin  40 mg Oral Daily  . diltiazem  180 mg Oral Daily  . fenofibrate  160 mg Oral Daily  . ferrous sulfate  325 mg Oral Daily  . guaiFENesin  600 mg Oral BID  . hydrALAZINE  50 mg Oral TID  . insulin aspart  0-5 Units Subcutaneous QHS  . insulin aspart  0-9 Units Subcutaneous TID WC  . insulin aspart  6 Units Subcutaneous TID WC  . insulin glargine  32 Units Subcutaneous Daily  . Ipratropium-Albuterol  1 puff Inhalation Q6H  . levothyroxine  150 mcg Oral QAC breakfast  . [START ON 09/10/2019] methylPREDNISolone (SOLU-MEDROL) injection  40 mg Intravenous Daily  . metoprolol tartrate  12.5 mg Oral BID  . sodium zirconium cyclosilicate  10 g Oral BID  . zinc sulfate  220 mg Oral Daily   Continuous Infusions: . heparin 1,550 Units/hr (09/09/19 0627)     LOS: 12 days   Time spent: [place], MD [facility]  09/09/2019, 9:00 AM

## 2019-09-09 NOTE — Plan of Care (Signed)
Lasix 60 mg IV given as ordered - 350 ml urine output noted. B wrist restraints started to help pt keep NRB mask on, will inform wife this AM. Pt appears to have settled after said interventions. Sats between 89-92% on 15L HFNC and NRB mask. External catheter changed, assisted with hygiene needs and repositioning. Crit K- 7.2 this AM. MD informed (see MAR). EKG done. Will recheck glucose at 0545. Repeat K at 0900 ordered. Heparin continued at current rate, no changes needed as level was 0.45, repeat levels due at 1200. Will continue to monitor.  Problem: Respiratory: Goal: Will maintain a patent airway Outcome: Progressing Goal: Complications related to the disease process, condition or treatment will be avoided or minimized Outcome: Progressing   Problem: Clinical Measurements: Goal: Diagnostic test results will improve 09/09/2019 0525 by Melvyn Neth, RN Outcome: Progressing 09/09/2019 0524 by Melvyn Neth, RN Outcome: Progressing   Problem: Clinical Measurements: Goal: Cardiovascular complication will be avoided 09/09/2019 0525 by Melvyn Neth, RN Outcome: Progressing 09/09/2019 0524 by Melvyn Neth, RN Outcome: Progressing   Problem: Activity: Goal: Risk for activity intolerance will decrease 09/09/2019 0525 by Melvyn Neth, RN Outcome: Progressing 09/09/2019 0524 by Melvyn Neth, RN Outcome: Progressing   Problem: Coping: Goal: Level of anxiety will decrease 09/09/2019 0525 by Melvyn Neth, RN Outcome: Progressing 09/09/2019 0524 by Melvyn Neth, RN Outcome: Progressing   Problem: Safety: Goal: Ability to remain free from injury will improve 09/09/2019 0525 by Melvyn Neth, RN Outcome: Progressing 09/09/2019 0524 by Melvyn Neth, RN Outcome: Progressing   Problem: Skin Integrity: Goal: Risk for impaired skin integrity will decrease 09/09/2019 0525 by Melvyn Neth, RN Outcome: Progressing 09/09/2019 0524 by Melvyn Neth, RN Outcome:  Progressing

## 2019-09-09 NOTE — Progress Notes (Signed)
After MD in room new orders received for IV lasix and a chest xray. MD suggests we let him sleep as sats are stable and patient did receive PO trazadone last night.  Patient was rolled and cleaned and positioned on left side.  Will monitor closely.

## 2019-09-09 NOTE — Progress Notes (Signed)
  Echocardiogram 2D Echocardiogram has been performed.  Michael [name] 09/09/2019, 4:05 PM

## 2019-09-10 DIAGNOSIS — J80 Acute respiratory distress syndrome: Secondary | ICD-10-CM

## 2019-09-10 DIAGNOSIS — A498 Other bacterial infections of unspecified site: Secondary | ICD-10-CM

## 2019-09-10 DIAGNOSIS — I824Y3 Acute embolism and thrombosis of unspecified deep veins of proximal lower extremity, bilateral: Secondary | ICD-10-CM

## 2019-09-10 DIAGNOSIS — I4819 Other persistent atrial fibrillation: Secondary | ICD-10-CM

## 2019-09-10 LAB — GLUCOSE, CAPILLARY
Glucose-Capillary: 102 mg/dL — ABNORMAL HIGH (ref 70–99)
Glucose-Capillary: 115 mg/dL — ABNORMAL HIGH (ref 70–99)
Glucose-Capillary: 147 mg/dL — ABNORMAL HIGH (ref 70–99)
Glucose-Capillary: 66 mg/dL — ABNORMAL LOW (ref 70–99)
Glucose-Capillary: 79 mg/dL (ref 70–99)
Glucose-Capillary: 81 mg/dL (ref 70–99)
Glucose-Capillary: 85 mg/dL (ref 70–99)
Glucose-Capillary: 91 mg/dL (ref 70–99)

## 2019-09-10 LAB — RENAL FUNCTION PANEL
Albumin: 1.8 g/dL — ABNORMAL LOW (ref 3.5–5.0)
Albumin: 2.1 g/dL — ABNORMAL LOW (ref 3.5–5.0)
Albumin: 2.3 g/dL — ABNORMAL LOW (ref 3.5–5.0)
Anion gap: 10 (ref 5–15)
Anion gap: 7 (ref 5–15)
Anion gap: 8 (ref 5–15)
BUN: 63 mg/dL — ABNORMAL HIGH (ref 8–23)
BUN: 85 mg/dL — ABNORMAL HIGH (ref 8–23)
BUN: 96 mg/dL — ABNORMAL HIGH (ref 8–23)
CO2: 26 mmol/L (ref 22–32)
CO2: 27 mmol/L (ref 22–32)
CO2: 28 mmol/L (ref 22–32)
Calcium: 6.9 mg/dL — ABNORMAL LOW (ref 8.9–10.3)
Calcium: 7.5 mg/dL — ABNORMAL LOW (ref 8.9–10.3)
Calcium: 7.6 mg/dL — ABNORMAL LOW (ref 8.9–10.3)
Chloride: 103 mmol/L (ref 98–111)
Chloride: 106 mmol/L (ref 98–111)
Chloride: 108 mmol/L (ref 98–111)
Creatinine, Ser: 1.8 mg/dL — ABNORMAL HIGH (ref 0.61–1.24)
Creatinine, Ser: 1.85 mg/dL — ABNORMAL HIGH (ref 0.61–1.24)
Creatinine, Ser: 2.15 mg/dL — ABNORMAL HIGH (ref 0.61–1.24)
GFR calc Af Amer: 33 mL/min — ABNORMAL LOW (ref >60)
GFR calc Af Amer: 40 mL/min — ABNORMAL LOW (ref >60)
GFR calc Af Amer: 41 mL/min — ABNORMAL LOW (ref >60)
GFR calc non Af Amer: 29 mL/min — ABNORMAL LOW (ref >60)
GFR calc non Af Amer: 35 mL/min — ABNORMAL LOW (ref >60)
GFR calc non Af Amer: 36 mL/min — ABNORMAL LOW (ref >60)
Glucose, Bld: 131 mg/dL — ABNORMAL HIGH (ref 70–99)
Glucose, Bld: 61 mg/dL — ABNORMAL LOW (ref 70–99)
Glucose, Bld: 83 mg/dL (ref 70–99)
Phosphorus: 5.1 mg/dL — ABNORMAL HIGH (ref 2.5–4.6)
Phosphorus: 5.5 mg/dL — ABNORMAL HIGH (ref 2.5–4.6)
Phosphorus: 5.7 mg/dL — ABNORMAL HIGH (ref 2.5–4.6)
Potassium: 5.1 mmol/L (ref 3.5–5.1)
Potassium: 5.2 mmol/L — ABNORMAL HIGH (ref 3.5–5.1)
Potassium: 5.3 mmol/L — ABNORMAL HIGH (ref 3.5–5.1)
Sodium: 137 mmol/L (ref 135–145)
Sodium: 143 mmol/L (ref 135–145)
Sodium: 143 mmol/L (ref 135–145)

## 2019-09-10 LAB — POCT I-STAT 7, (LYTES, BLD GAS, ICA,H+H)
Acid-base deficit: 2 mmol/L (ref 0.0–2.0)
Acid-base deficit: 4 mmol/L — ABNORMAL HIGH (ref 0.0–2.0)
Bicarbonate: 29.1 mmol/L — ABNORMAL HIGH (ref 20.0–28.0)
Bicarbonate: 25.4 mmol/L (ref 20.0–28.0)
Bicarbonate: 23.5 mmol/L (ref 20.0–28.0)
Calcium, Ion: 1.12 mmol/L — ABNORMAL LOW (ref 1.15–1.40)
Calcium, Ion: 1.04 mmol/L — ABNORMAL LOW (ref 1.15–1.40)
Calcium, Ion: 0.98 mmol/L — ABNORMAL LOW (ref 1.15–1.40)
HCT: 30 % — ABNORMAL LOW (ref 39.0–52.0)
HCT: 27 % — ABNORMAL LOW (ref 39.0–52.0)
HCT: 27 % — ABNORMAL LOW (ref 39.0–52.0)
Hemoglobin: 10.2 g/dL — ABNORMAL LOW (ref 13.0–17.0)
Hemoglobin: 9.2 g/dL — ABNORMAL LOW (ref 13.0–17.0)
Hemoglobin: 9.2 g/dL — ABNORMAL LOW (ref 13.0–17.0)
O2 Saturation: 80 %
O2 Saturation: 96 %
O2 Saturation: 97 %
Patient temperature: 97.9
Patient temperature: 36.5
Patient temperature: 36.4
Potassium: 5.1 mmol/L (ref 3.5–5.1)
Potassium: 5.3 mmol/L — ABNORMAL HIGH (ref 3.5–5.1)
Potassium: 5.6 mmol/L — ABNORMAL HIGH (ref 3.5–5.1)
Sodium: 142 mmol/L (ref 135–145)
Sodium: 136 mmol/L (ref 135–145)
Sodium: 136 mmol/L (ref 135–145)
TCO2: 31 mmol/L (ref 22–32)
TCO2: 27 mmol/L (ref 22–32)
TCO2: 25 mmol/L (ref 22–32)
pCO2 arterial: 67.9 mmHg (ref 32.0–48.0)
pCO2 arterial: 55.9 mmHg — ABNORMAL HIGH (ref 32.0–48.0)
pCO2 arterial: 53.9 mmHg — ABNORMAL HIGH (ref 32.0–48.0)
pH, Arterial: 7.238 — ABNORMAL LOW (ref 7.350–7.450)
pH, Arterial: 7.262 — ABNORMAL LOW (ref 7.350–7.450)
pH, Arterial: 7.244 — ABNORMAL LOW (ref 7.350–7.450)
pO2, Arterial: 53 mmHg — ABNORMAL LOW (ref 83.0–108.0)
pO2, Arterial: 92 mmHg (ref 83.0–108.0)
pO2, Arterial: 110 mmHg — ABNORMAL HIGH (ref 83.0–108.0)

## 2019-09-10 LAB — C-REACTIVE PROTEIN: CRP: 14.4 mg/dL — ABNORMAL HIGH (ref <1.0)

## 2019-09-10 LAB — CBC
HCT: 34.3 % — ABNORMAL LOW (ref 39.0–52.0)
Hemoglobin: 10 g/dL — ABNORMAL LOW (ref 13.0–17.0)
MCH: 26.8 pg (ref 26.0–34.0)
MCHC: 29.2 g/dL — ABNORMAL LOW (ref 30.0–36.0)
MCV: 92 fL (ref 80.0–100.0)
Platelets: 161 10*3/uL (ref 150–400)
RBC: 3.73 MIL/uL — ABNORMAL LOW (ref 4.22–5.81)
RDW: 17.1 % — ABNORMAL HIGH (ref 11.5–15.5)
WBC: 45.4 10*3/uL — ABNORMAL HIGH (ref 4.0–10.5)
nRBC: 0 % (ref 0.0–0.2)

## 2019-09-10 LAB — POTASSIUM
Potassium: 4.5 mmol/L (ref 3.5–5.1)
Potassium: 5.3 mmol/L — ABNORMAL HIGH (ref 3.5–5.1)
Potassium: 5.6 mmol/L — ABNORMAL HIGH (ref 3.5–5.1)

## 2019-09-10 LAB — TRIGLYCERIDES: Triglycerides: 55 mg/dL (ref <150)

## 2019-09-10 LAB — MAGNESIUM: Magnesium: 3 mg/dL — ABNORMAL HIGH (ref 1.7–2.4)

## 2019-09-10 LAB — HEPARIN LEVEL (UNFRACTIONATED)
Heparin Unfractionated: 0.56 IU/mL (ref 0.30–0.70)
Heparin Unfractionated: 0.86 IU/mL — ABNORMAL HIGH (ref 0.30–0.70)
Heparin Unfractionated: 0.66 IU/mL (ref 0.30–0.70)

## 2019-09-10 LAB — FERRITIN: Ferritin: 420 ng/mL — ABNORMAL HIGH (ref 24–336)

## 2019-09-10 LAB — APTT: aPTT: 80 seconds — ABNORMAL HIGH (ref 24–36)

## 2019-09-10 MED ORDER — VITAL HIGH PROTEIN PO LIQD
1000.0000 mL | ORAL | Status: DC
  Administered 2019-09-10: 12:00:00 1000 mL

## 2019-09-10 MED ORDER — DEXTROSE 50 % IV SOLN
INTRAVENOUS | Status: AC
  Filled 2019-09-10: qty 50

## 2019-09-10 MED ORDER — FERROUS SULFATE 300 (60 FE) MG/5ML PO SYRP
300.0000 mg | ORAL_SOLUTION | Freq: Every day | ORAL | Status: DC
  Administered 2019-09-11: 09:00:00 300 mg
  Filled 2019-09-10: qty 5

## 2019-09-10 MED ORDER — SODIUM POLYSTYRENE SULFONATE 15 GM/60ML PO SUSP
30.0000 g | Freq: Once | ORAL | Status: AC
  Administered 2019-09-10: 07:00:00 30 g
  Filled 2019-09-10: qty 120

## 2019-09-10 MED ORDER — FAMOTIDINE 40 MG/5ML PO SUSR
20.0000 mg | Freq: Every day | ORAL | Status: DC
  Administered 2019-09-11: 20 mg
  Filled 2019-09-10: qty 2.5

## 2019-09-10 MED ORDER — ALLOPURINOL 100 MG PO TABS
200.0000 mg | ORAL_TABLET | Freq: Every day | ORAL | Status: DC
  Administered 2019-09-11: 09:00:00 200 mg
  Filled 2019-09-10: qty 2

## 2019-09-10 MED ORDER — INSULIN GLARGINE 100 UNIT/ML ~~LOC~~ SOLN
20.0000 [IU] | Freq: Every day | SUBCUTANEOUS | Status: DC
  Filled 2019-09-10: qty 0.2

## 2019-09-10 MED ORDER — PHENYLEPHRINE CONCENTRATED 100MG/250ML (0.4 MG/ML) INFUSION SIMPLE
0.0000 ug/min | INTRAVENOUS | Status: DC
  Administered 2019-09-10: 280 ug/min via INTRAVENOUS
  Administered 2019-09-10: 100 ug/min via INTRAVENOUS
  Administered 2019-09-10: 270 ug/min via INTRAVENOUS
  Administered 2019-09-11: 250 ug/min via INTRAVENOUS
  Administered 2019-09-11: 310 ug/min via INTRAVENOUS
  Filled 2019-09-10 (×5): qty 250

## 2019-09-10 MED ORDER — LEVOTHYROXINE SODIUM 150 MCG PO TABS
150.0000 ug | ORAL_TABLET | Freq: Every day | ORAL | Status: DC
  Administered 2019-09-11: 150 ug
  Filled 2019-09-10 (×2): qty 1

## 2019-09-10 MED ORDER — IPRATROPIUM-ALBUTEROL 0.5-2.5 (3) MG/3ML IN SOLN
3.0000 mL | Freq: Four times a day (QID) | RESPIRATORY_TRACT | Status: DC
  Administered 2019-09-10 – 2019-09-11 (×5): 3 mL via RESPIRATORY_TRACT
  Filled 2019-09-10 (×4): qty 3

## 2019-09-10 MED ORDER — DEXTROSE 50 % IV SOLN
12.5000 g | INTRAVENOUS | Status: AC
  Administered 2019-09-10: 12:00:00 12.5 g via INTRAVENOUS

## 2019-09-10 MED ORDER — CHLORHEXIDINE GLUCONATE 0.12% ORAL RINSE (MEDLINE KIT)
15.0000 mL | Freq: Two times a day (BID) | OROMUCOSAL | Status: DC
  Administered 2019-09-10 – 2019-09-11 (×3): 15 mL via OROMUCOSAL

## 2019-09-10 MED ORDER — ATORVASTATIN CALCIUM 40 MG PO TABS
40.0000 mg | ORAL_TABLET | Freq: Every day | ORAL | Status: DC
  Administered 2019-09-11: 09:00:00 40 mg
  Filled 2019-09-10: qty 1

## 2019-09-10 MED ORDER — PRISMASOL BGK 4/2.5 32-4-2.5 MEQ/L IV SOLN: INTRAVENOUS | Status: DC

## 2019-09-10 MED ORDER — PRO-STAT SUGAR FREE PO LIQD
30.0000 mL | Freq: Two times a day (BID) | ORAL | Status: DC
  Administered 2019-09-10 – 2019-09-11 (×3): 30 mL
  Filled 2019-09-10 (×3): qty 30

## 2019-09-10 MED ORDER — HEPARIN (PORCINE) 25000 UT/250ML-% IV SOLN
1450.0000 [IU]/h | INTRAVENOUS | Status: DC
  Administered 2019-09-11: 02:00:00 1450 [IU]/h via INTRAVENOUS
  Filled 2019-09-10: qty 250

## 2019-09-10 MED ORDER — ORAL CARE MOUTH RINSE
15.0000 mL | OROMUCOSAL | Status: DC
  Administered 2019-09-10 – 2019-09-11 (×10): 15 mL via OROMUCOSAL

## 2019-09-10 MED ORDER — ASCORBIC ACID 500 MG PO TABS
500.0000 mg | ORAL_TABLET | Freq: Every day | ORAL | Status: DC
  Administered 2019-09-11: 500 mg
  Filled 2019-09-10: qty 1

## 2019-09-10 MED ORDER — AMIODARONE HCL 200 MG PO TABS
200.0000 mg | ORAL_TABLET | ORAL | Status: DC
  Filled 2019-09-10: qty 1

## 2019-09-10 MED ORDER — PANTOPRAZOLE SODIUM 40 MG IV SOLR
40.0000 mg | INTRAVENOUS | Status: DC
  Administered 2019-09-10: 13:00:00 40 mg via INTRAVENOUS
  Filled 2019-09-10: qty 40

## 2019-09-10 MED ORDER — NOREPINEPHRINE 16 MG/250ML-% IV SOLN
0.0000 ug/min | INTRAVENOUS | Status: DC
  Administered 2019-09-10: 01:00:00 30 ug/min via INTRAVENOUS
  Administered 2019-09-11: 10 ug/min via INTRAVENOUS
  Filled 2019-09-10 (×2): qty 250

## 2019-09-10 NOTE — Progress Notes (Signed)
Potassium re-check of 5.6. Paged Dr. Jonnie Finner who notified to change from 4K/2.5Ca to 0K/2.5Ca.

## 2019-09-10 NOTE — Progress Notes (Signed)
Pt's ETT taped in the center with cloth tape and secured.  Pt proned at this time with some difficulty.  Pt very stiff and head not turning well with body.  Pt tolerated fairly well and no noted complications at this time.  RT will continue to monitor.

## 2019-09-10 NOTE — Progress Notes (Signed)
ANTICOAGULATION CONSULT NOTE - Initial Consult  Pharmacy Consult for IV Heparin  Indication:  VTE treatment + RLE DVT  Allergies  Allergen Reactions  . Percocet [Oxycodone-Acetaminophen] Nausea And Vomiting  . Ciprofloxacin Other (See Comments)    Light headed and made stagger  . Metformin And Related Nausea Only    Upset stomach  . Invokana [Canagliflozin] Rash    Patient Measurements: Height: 5\' 11"  (180.3 cm) Weight: 244 lb 0.8 oz (110.7 kg) IBW/kg (Calculated) : 75.3 Heparin Dosing Weight:  99 kg  Vital Signs: Temp: 98.1 F (36.7 C) (02/14 0000) Temp Source: Oral (02/14 0000) BP: 132/43 (02/14 0200) Pulse Rate: 61 (02/14 0200)  Labs: Recent Labs    09/08/19 0033 09/08/19 0033 09/09/19 0203 09/09/19 0203 09/09/19 1200 09/09/19 1500 09/09/19 1500 09/09/19 1617 09/09/19 1617 09/09/19 2000 09/09/19 2340 09/10/19 0040 09/10/19 0158  HGB 9.3*   < > 11.2*   < >  --  8.9*   < > 8.2*   < > 8.5*  --   --  10.2*  HCT 31.1*   < > 37.2*   < >  --  29.4*   < > 24.0*  --  25.0*  --   --  30.0*  PLT 134*  --  259  --   --  128*  --   --   --   --   --   --   --   HEPARINUNFRC  --   --  0.45  --  0.62  --   --   --   --   --   --  0.56  --   CREATININE 2.49*   < > 2.24*   < > 2.78* 2.97*  --   --   --   --  2.15*  --   --    < > = values in this interval not displayed.    Estimated Creatinine Clearance: 37 mL/min (A) (by C-G formula based on SCr of 2.15 mg/dL (H)).  Assessment: 79 y.o male with h/o CKD 3, HTN, HFpEF, paroxysmal A. Fib not on anticoagulation PTA due to prior GI bleed, psoriasis, small beowe AVM 2018 with coincidental hemorrhoidal etiology in the past.  Admitted 09/07/2019 for coronavirus 19 pneumonia.  Noted Hemorrhoidal bleeding.  GI evaluated patient on 09/21/2019 and deferred colonoscopy given respiratory status.    LE dopplers + for DVT right peroneal left Tibial vein.  Pharmacy consulted to start IV heparin vte treatment dose. MD plans to eventually  transition to oral NOAC  Hgb 9.8>9.3, pltc 145>134, (admit Hgb 8.6 and pltc 224 on 09/18/2019) He was receiving SQ heparin 7500 units q8h,  last given 09/08/19 @  0507 No bleeding currently per RN SCr 2.49,  H/o CKC stage 3  09/10/19 0320 Heparin level: 0.56IU/mL, within therapeutic goal range on heparin at 1550 units/hr RN reports no S/S  of bleeding  or issues with  CBC:  Hb has increased to 10.2mg /dL; platelets now 128K  Goal of Therapy:  Heparin level 0.3-0.7 units/ml Monitor platelets by anticoagulation protocol: Yes   Plan:   Continue  heparin drip rate at 1550 units/hr  Re-check  heparin level in ~8hrs to confirm (new CRRT start) Daily HL and CBC while on heparin Monitor for signs and symptoms of bleeding  Despina Pole, Pharm. D. Clinical Pharmacist 09/10/2019 3:19 AM

## 2019-09-10 NOTE — Progress Notes (Signed)
ANTICOAGULATION CONSULT NOTE - Follow Up Consult  Pharmacy Consult for Heparin IV Indication: DVT  Allergies  Allergen Reactions  . Percocet [Oxycodone-Acetaminophen] Nausea And Vomiting  . Ciprofloxacin Other (See Comments)    Light headed and made stagger  . Metformin And Related Nausea Only    Upset stomach  . Invokana [Canagliflozin] Rash    Patient Measurements: Height: 5\' 11"  (180.3 cm) Weight: 223 lb 15.8 oz (101.6 kg) IBW/kg (Calculated) : 75.3 Heparin Dosing Weight: 99kg  Vital Signs: Temp: 97.3 F (36.3 C) (02/14 2045) Temp Source: Esophageal (02/14 2000) BP: 118/44 (02/14 2045) Pulse Rate: 76 (02/14 2045)  Labs: Recent Labs    09/09/19 0203 09/09/19 1200 09/09/19 1500 09/09/19 1617 09/09/19 2340 09/10/19 0040 09/10/19 0158 09/10/19 0430 09/10/19 0431 09/10/19 0431 09/10/19 0813 09/10/19 1612 09/10/19 1758 09/10/19 1953 09/10/19 2011  HGB 11.2*  --  8.9*   < >  --   --    < >  --  10.0*   < >  --   --  9.2*  --  9.2*  HCT 37.2*  --  29.4*   < >  --   --    < >  --  34.3*  --   --   --  27.0*  --  27.0*  PLT 259  --  128*  --   --   --   --   --  161  --   --   --   --   --   --   APTT  --   --   --   --   --   --   --   --  80*  --   --   --   --   --   --   HEPARINUNFRC 0.45   < >  --   --   --  0.56  --   --   --   --  0.86*  --   --  0.66  --   CREATININE 2.24*   < > 2.97*   < > 2.15*  --   --  1.85*  --   --   --  1.80*  --   --   --    < > = values in this interval not displayed.    Estimated Creatinine Clearance: 42.4 mL/min (A) (by C-G formula based on SCr of 1.8 mg/dL (H)).   Medications:  Infusions:  .  prismasol BGK 4/2.5 400 mL/hr at 09/10/19 1905  .  prismasol BGK 4/2.5 200 mL/hr at 09/10/19 1856  . ceFEPime (MAXIPIME) IV Stopped (09/10/19 9163)  . fentaNYL infusion INTRAVENOUS 125 mcg/hr (09/10/19 2000)  . heparin 1,450 Units/hr (09/10/19 2000)  . norepinephrine (LEVOPHED) Adult infusion Stopped (09/10/19 8466)  . phenylephrine  (NEO-SYNEPHRINE) Adult infusion 290 mcg/min (09/10/19 2000)  . prismasol BGK 4/2.5 1,800 mL/hr at 09/10/19 2051  . propofol (DIPRIVAN) infusion 25 mcg/kg/min (09/10/19 2000)  . vancomycin Stopped (09/10/19 1824)    Assessment: 79 y.o male with h/o CKD 3, HTN, HFpEF, paroxysmal A. Fib not on anticoagulation PTA due to prior GI bleed, psoriasis, small beowe AVM 2018 with coincidental hemorrhoidal etiology in the past.  Admitted 09/05/2019 for coronavirus 19 pneumonia.  Noted Hemorrhoidal bleeding.  GI evaluated patient on 09/02/2019 and deferred colonoscopy given respiratory status.  He was receiving SQ heparin 7500 units q8h,  last given 09/08/19 @  0507.  LE dopplers + for DVT right peroneal left Tibial  vein.  Pharmacy consulted to start IV heparin.  Today, 09/10/2019: Heparin level 0.66, therapeutic on heparin at 1450 units/hr. H/H slightly decreased 9.2/27 from earlier today. No active bleeding reported.  Goal of Therapy:  Heparin level 0.3-0.7 units/ml Monitor platelets by anticoagulation protocol: Yes   Plan:  Continue heparin IV infusion at 1450 units/hr Daily heparin level and CBC Continue to monitor H&H and platelets  Peggyann Juba, PharmD, BCPS Pharmacy: [phone] 09/10/2019 8:53 PM

## 2019-09-10 NOTE — Progress Notes (Signed)
ANTICOAGULATION CONSULT NOTE - Follow Up Consult  Pharmacy Consult for Heparin IV Indication: DVT  Allergies  Allergen Reactions  . Percocet [Oxycodone-Acetaminophen] Nausea And Vomiting  . Ciprofloxacin Other (See Comments)    Light headed and made stagger  . Metformin And Related Nausea Only    Upset stomach  . Invokana [Canagliflozin] Rash    Patient Measurements: Height: 5\' 11"  (180.3 cm) Weight: 223 lb 15.8 oz (101.6 kg) IBW/kg (Calculated) : 75.3 Heparin Dosing Weight: 99kg  Vital Signs: Temp: 97.8 F (36.6 C) (02/14 0700) Temp Source: Axillary (02/14 0400) BP: 102/49 (02/14 0800) Pulse Rate: 62 (02/14 0800)  Labs: Recent Labs    09/09/19 0203 09/09/19 0203 09/09/19 1200 09/09/19 1500 09/09/19 1617 09/09/19 2000 09/09/19 2000 09/09/19 2340 09/10/19 0040 09/10/19 0158 09/10/19 0430 09/10/19 0431  HGB 11.2*   < >  --  8.9*   < > 8.5*   < >  --   --  10.2*  --  10.0*  HCT 37.2*   < >  --  29.4*   < > 25.0*  --   --   --  30.0*  --  34.3*  PLT 259  --   --  128*  --   --   --   --   --   --   --  161  APTT  --   --   --   --   --   --   --   --   --   --   --  80*  HEPARINUNFRC 0.45  --  0.62  --   --   --   --   --  0.56  --   --   --   CREATININE 2.24*   < > 2.78* 2.97*  --   --   --  2.15*  --   --  1.85*  --    < > = values in this interval not displayed.    Estimated Creatinine Clearance: 41.2 mL/min (A) (by C-G formula based on SCr of 1.85 mg/dL (H)).   Medications:  Infusions:  .  prismasol BGK 4/2.5 400 mL/hr at 09/09/19 1734  .  prismasol BGK 4/2.5 200 mL/hr at 09/09/19 1734  . ceFEPime (MAXIPIME) IV Stopped (09/09/19 1757)  . fentaNYL infusion INTRAVENOUS 125 mcg/hr (09/10/19 0800)  . heparin 1,550 Units/hr (09/10/19 0800)  . norepinephrine (LEVOPHED) Adult infusion Stopped (09/10/19 0539)  . phenylephrine (NEO-SYNEPHRINE) Adult infusion 280 mcg/min (09/10/19 0800)  . prismasol BGK 0/2.5 1,800 mL/hr at 09/10/19 0810  . propofol (DIPRIVAN)  infusion 25 mcg/kg/min (09/10/19 0810)  . vancomycin      Assessment: 79 y.o male with h/o CKD 3, HTN, HFpEF, paroxysmal A. Fib not on anticoagulation PTA due to prior GI bleed, psoriasis, small beowe AVM 2018 with coincidental hemorrhoidal etiology in the past.  Admitted 09/22/2019 for coronavirus 19 pneumonia.  Noted Hemorrhoidal bleeding.  GI evaluated patient on 09/02/2019 and deferred colonoscopy given respiratory status.  He was receiving SQ heparin 7500 units q8h,  last given 09/08/19 @  0507.  LE dopplers + for DVT right peroneal left Tibial vein.  Pharmacy consulted to start IV heparin.  Today, 09/10/2019: Heparin level 0.62, remains therapeutic on heparin at 1550 units/hr CBC: Hgb 10, with wide variability (admit Hgb 8.6, then 9.3, 11, 8.9), Plt 161 with wide variability (admit 224, then 134, 259, 128) No active bleeding noted, RN notes a very minor nosebleed (slight ooze ) SCr 1.85 (CKD-III),  started on CRRT 2/13  Goal of Therapy:  Heparin level 0.3-0.7 units/ml Monitor platelets by anticoagulation protocol: Yes   Plan:  Decrease to heparin IV infusion at 1450 units/hr Heparin level 8 hours after rate change Daily heparin level and CBC Continue to monitor H&H and platelets   Gretta Arab PharmD, BCPS Clinical pharmacist phone 7am- 5pm: [phone] 09/10/2019 8:22 AM

## 2019-09-10 NOTE — Progress Notes (Signed)
Brief Nutrition Note RD working remotely.  Consult received for enteral/tube feeding initiation and management.  Adult Enteral Nutrition Protocol initiated. Full assessment to follow.  Admitting Dx: Dyspnea [R06.00] Acute respiratory failure with hypoxia (HCC) [J96.01] Gastrointestinal hemorrhage, unspecified gastrointestinal hemorrhage type [K92.2] Suspected COVID-19 virus infection [Z20.822]  Body mass index is 31.24 kg/m. Pt meets criteria for obesity class I based on current BMI.  Labs:  Recent Labs  Lab 09/06/19 0222 09/06/19 0222 09/07/19 0208 09/08/19 0033 09/09/19 1500 09/09/19 1617 09/09/19 2340 09/10/19 0158 09/10/19 0430 09/10/19 0431  NA 140   < > 138   < > 143   < > 143 142 143  --   K 5.9*   < > 6.1*   < > 7.5*   < > 5.3*  5.3* 5.1 5.1  --   CL 106   < > 104   < > 104  --  108  --  106  --   CO2 26   < > 24   < > 27  --  28  --  27  --   BUN 102*   < > 113*   < > 90*  --  96*  --  85*  --   CREATININE 2.33*   < > 2.37*   < > 2.97*  --  2.15*  --  1.85*  --   CALCIUM 7.4*   < > 7.2*   < > 7.9*  --  7.5*  --  7.6*  --   MG 3.0*  --  3.4*  --   --   --   --   --   --  3.0*  PHOS  --   --   --   --  9.5*  --  5.7*  --  5.1*  --   GLUCOSE 145*   < > 236*   < > 255*  --  131*  --  61*  --    < > = values in this interval not displayed.    Jacklynn Barnacle, MS, RD, LDN Pager number available on Amion

## 2019-09-10 NOTE — Progress Notes (Signed)
Called patient's wife and updated her to patient's condition and current plan of care. All questions were answered.

## 2019-09-10 NOTE — Progress Notes (Addendum)
Michael [name] Progress Note  Subjective: I/O yest even, UOP dropped down to 600 cc . K + better, low 5's   Vitals:   09/10/19 0500 09/10/19 0530 09/10/19 0600 09/10/19 0700  BP: (!) 110/42 (!) 128/43 (!) 109/41 (!) 113/42  Pulse: 69 68 66 63  Resp: (!) 35 (!) 31 (!) 35 (!) 35  Temp:    97.8 F (36.6 C)  TempSrc:      SpO2: 98% 99% 97% 99%  Weight:      Height:        Exam:  Patient not examined today directly given COVID-19 + status, utilizing data taken from chart +/- discussions w/ providers and staff.      Home meds:  - diltiazem xt 180 qd/furosemide 40 bid/ hydralazine 50 tid/ lisinopril 5 hs/ metoprolol 12.5 bid  - sl ntg prn/ atorvastatin 40/ aspirin 81/ fenofibrate 160   - amiodarone 200 qd  - insulin 70/30 50u bid  - levothyroxine 150 qd/ gabapentin 300 tid/ allopurinol 200 qd  - secukinumab 137m q 30d sq  baseline creat 1.7- 2.1 Feb to Dec 2020  eGFR 23- 38 ml/min , stage III/ IV in 2020  UA pend    Michael [name], UCr pend   prior UA's (2011, 2014, 2015) all neg   Renal [place]2/13 > 13cm kidneys w/o hydro, mild cort thinning   CXR 2/13 - bilat diffuse infiltrates, no chg   ECHO 2/13 > EF 60-65%, severe LVH, RV ok, +mod to severe AS  Assessment/ Plan: 1. AoCKD 3 - baseline creat 1.7- 2.1 in 2020. Creat here 2.1 - 2.5 range. BUN up from steroids/ catabolic state. Severe hyperkalemia 2/13 am w/ WCT. Improved w/ CRRT.  - cont CRRT w/ 0K+ dialysate, change when K+ mid 4's  - UOP down due to shock  - no vol^^, will keep + 50 cc/hr to cover insensibles   2. Severe hyperkalemia - not sure cause, poss acute resp acidosis d/t worsening ARDS; resolving on CRRT 3. Shock /hypotension - on neo gtt now 4. Volume - no s/o vol excess, I/O neg since admit 5. COVID +PNA - admitted 08/28/19, on vent 100% fiO2 6. DM2 - on insulin 7. Atrial fib - not on a/c due to hx GIB 8. Aortic stenosis - mod/ severe per ECHO 2/13 9. CAD      Rob Michael [name] 09/10/2019, 7:57  AM   Recent Labs  Lab 09/09/19 2340 09/09/19 2340 09/10/19 0158 09/10/19 0430  K 5.3*  5.3*   < > 5.1 5.1  BUN 96*  --   --  85*  CREATININE 2.15*  --   --  1.85*  CALCIUM 7.5*  --   --  7.6*  PHOS 5.7*  --   --  5.1*   < > = values in this interval not displayed.   Inpatient medications: . allopurinol  200 mg Oral Daily  . amiodarone  200 mg Oral Once per day on Mon Tue Wed Thu Fri Sat  . artificial tears  1 application Both Eyes QM0Q . vitamin C  500 mg Oral Daily  . atorvastatin  40 mg Oral Daily  . chlorhexidine gluconate (MEDLINE KIT)  15 mL Mouth Rinse BID  . Chlorhexidine Gluconate Cloth  6 each Topical Daily  . ferrous sulfate  325 mg Oral Daily  . insulin aspart  1-3 Units Subcutaneous Q4H  . insulin glargine  32 Units Subcutaneous Daily  . Ipratropium-Albuterol  1 puff Inhalation Q6H  .  levothyroxine  150 mcg Oral QAC breakfast  . mouth rinse  15 mL Mouth Rinse 10 times per day  . methylPREDNISolone (SOLU-MEDROL) injection  40 mg Intravenous Daily  . sennosides  5 mL Per Tube Daily  . sodium zirconium cyclosilicate  10 g Oral BID   .  prismasol BGK 4/2.5 400 mL/hr at 09/09/19 1734  .  prismasol BGK 4/2.5 200 mL/hr at 09/09/19 1734  . ceFEPime (MAXIPIME) IV Stopped (09/09/19 1757)  . fentaNYL infusion INTRAVENOUS 125 mcg/hr (09/10/19 0700)  . heparin 1,550 Units/hr (09/10/19 0734)  . norepinephrine (LEVOPHED) Adult infusion Stopped (09/10/19 9741)  . phenylephrine (NEO-SYNEPHRINE) Adult infusion 240 mcg/min (09/10/19 0700)  . prismasol BGK 0/2.5 1,800 mL/hr at 09/09/19 2039  . propofol (DIPRIVAN) infusion 25 mcg/kg/min (09/10/19 0700)  . vancomycin     acetaminophen, alteplase, fentaNYL, heparin, magic mouthwash w/lidocaine, polyethylene glycol, senna-docusate, sodium chloride, vecuronium

## 2019-09-10 NOTE — Progress Notes (Signed)
Changes made based on ABG result.  RT preparing pt to be proned at this time also based on ABG result.

## 2019-09-10 NOTE — Progress Notes (Signed)
eLink Physician-Brief Progress Note Patient Name: Michael [name] DOB: 1940/08/22 MRN: [id number]   Date of Service  09/10/2019  HPI/Events of Note  K+ = 5.1. Already on Lokelma.   eICU Interventions  Will order: 1. Kayexalate 30 gm per tube now.  2. Repeat Renal panel at 4 PM.      Intervention Category Major Interventions: Electrolyte abnormality - evaluation and management  Danyal Adorno Eugene 09/10/2019, 6:21 AM

## 2019-09-10 NOTE — Progress Notes (Signed)
Pt's head turned at this time w/o complication.  Pt's head very very stiff and hard to turn.

## 2019-09-10 NOTE — Progress Notes (Addendum)
NAME:  Michael [name], MRN:  [id number], DOB:  1941-06-04, LOS: 11 ADMISSION DATE:  09/13/2019, CONSULTATION DATE:  2/13 REFERRING MD:  Verlon Au, CHIEF COMPLAINT:  Hyperkalemia, encephalopathy   Brief History   covid 19 pneumonia AKI w/ hyperkalemia Progressive encephalopathy DVT BLE- heparin gtt  History of present illness   [name] is a 79 year old gentleman admitted to [facility] hospital on 2/1 with COVID-19 pneumonia.  He had upper respiratory cold-like symptoms, cough, shortness of breath, weakness prior to admission.  He had been eating and drinking minimally and not taking his meds.  At admission he was requiring 2 L supplemental oxygen.  Earlier in his admission he was has been treated for 7 days with cefazolin for staph epidermis bacteremia.  For the last several days he has had increasing oxygen requirements.  He has CKD 4 with an AKI and hyperkalemia.  Today his potassium rapidly rose to 7.2, repeated was 8.  He became increasingly encephalopathic and was unable to take p.o. medications.  A rapid response was called when he was nonresponsive.  Overnight he had received trazodone.  Past Medical History  DM2 Psoriasis AS HFpEF IDA HTN Hypothyroid C. Diff CKD 4 AVM  Significant Hospital Events   Upgrade to ICU, intubated 2/13  Consults:  Nephro  PCCM  Procedures:  Intubation 2/13 trialysis 2/13  Significant Diagnostic Tests:  Head CT 2/13: no acute bleeding CXR 2/13: progressive bilateral airspace disease CT chest 2/12: b/l diffuse GGOs, LLL posterior consolidation vs pleural thickening CT head 2/13: No acute abnormalities, no bleed Echo 2/13: LVEF 60 to 65%, severe LVH, no regional wall motion abnormalities.  Moderately dilated LA.  Normal RV size and function.  Moderate to severe AS, mild MS, mild TR.  Micro Data:  2/3 blood cx : NG, final 2/1 blood cx: staph epi (4/4) & staph hominis in 2/4 bottles 2/13 respiratory >> Pseudomonas >> 2/13 blood>>  Antimicrobials:    remdesivir 2/1> 2/5 cefazolin 2/2>2/8 Vancomycin 2/13>> Cefepime 2/13>>  Interim history/subjective:  Proned overnight, started on neuromuscular blockade.  Required increased sedation-fentanyl and propofol infusions.  Required initiation of vasopressors.  Remained hyperkalemic and received Kayexalate.  Continued on CRRT.  Objective   Blood pressure (!) 108/47, pulse (!) 56, temperature 97.8 F (36.6 C), resp. rate (!) 35, height 5\' 11"  (1.803 m), weight 101.6 kg, SpO2 100 %.    Vent Mode: PRVC FiO2 (%):  [60 %-100 %] 100 % Set Rate:  [24 bmp-35 bmp] 35 bmp Vt Set:  [480 mL-600 mL] 500 mL PEEP:  [5 cmH20-12 cmH20] 12 cmH20 Plateau Pressure:  [31 cmH20-42 cmH20] 36 cmH20   Intake/Output Summary (Last 24 hours) at 09/10/2019 1249 Last data filed at 09/10/2019 1200 Gross per 24 hour  Intake 2651.88 ml  Output 2764 ml  Net -112.12 ml   Filed Weights   08/30/2019 1830 09/10/19 0348 09/10/19 0445  Weight: 110.7 kg 101.6 kg 101.6 kg    Examination: General: Critically ill-appearing man, intubated, sedated, proned HENT: [place]/AT, exam limited due to prone positioning Lungs: Rales worse on the left compared to the right posteriorly, breathing synchronously with the ventilator.  High peak and plateau pressures. Cardiovascular: Regular rate and rhythm, no murmurs Abdomen: Soft, nontender Extremities: No clubbing, cyanosis, edema. Neuro: Under NMB, pupils unable to be examined due to positioning GU: Foley catheter in place  Resolved Hospital Problem list     Assessment & Plan:   Acute encephalopathy- likely metabolic encephalopathy.  Head CT on 2/13 without evidence  of acute bleeding or other acute abnormalities. -Continue empiric antibiotics -Unfortunately requires heavy sedation to tolerate mechanical ventilation  Acute hypoxic respiratory failure 2/2 covid 19 viral pneumonia, ARDS, now Pseudomonas pneumonia. Intubated emergently due to pending respiratory failure from inability to  protect airway.  Severe hypoxia and hypercapnia, not at goal with vent pressures. -Continue LTV V, 4 to 8 cc/kg ideal body weight with goal plateau less than 30 and driving pressures less than 20-unable to meet these goals currently.  Permissive hypercapnia.  Titrate PEEP and FiO2 per ARDS goals. -ABG before and after proning today (unclear if he actually improved with this overnight or not) -Daily SAT and SBT once appropriate -Prone 16 hours/day if P:F less than 150 -VAP prevention protocol  Hyperkalemia with VT, unstable bradyarrhythmias-hyperkalemia resolving -Continue CRRT-appreciate nephrology's assistance -DC Lokelma and Kayexalate -Continue to monitor potassium  Bilateral LE DVTs -heparin gtt  Staph epidermis bactermia; follow up cx negative. Previously received 1 week cefazolin -Echocardiogram -Repeat blood cultures pending -Continue broad-spectrum antibiotics  Moderate to severe aortic stenosis, history of HFpEF Ventricular tachycardia due to hyperkalemia-solved -Continue bulimia -Appreciate nephrology's assistance -Severe valvular heart disease portends a poor prognosis  Guarded prognosis with underlying CKD 4 and aortic stenosis, now complicated by vent dependent respiratory failure and renal failure requiring urgency dialysis.   Best practice:  Diet: NPO Pain/Anxiety/Delirium protocol (if indicated): yes VAP protocol (if indicated): yes DVT prophylaxis: heparin gtt GI prophylaxis: protonix Glucose control: Lantus, sliding scale insulin Mobility: bedrest Code Status: full Family Communication: updated [name]  Disposition: ICU  Labs   CBC: Recent Labs  Lab 09/07/19 0208 09/07/19 0208 09/08/19 0033 09/08/19 0033 09/09/19 0203 09/09/19 0203 09/09/19 1500 09/09/19 1617 09/09/19 2000 09/10/19 0158 09/10/19 0431  WBC 15.7*  --  15.2*  --  48.1*  --  25.9*  --   --   --  45.4*  NEUTROABS  --   --   --   --   --   --  24.9*  --   --   --   --   HGB  9.8*   < > 9.3*   < > 11.2*   < > 8.9* 8.2* 8.5* 10.2* 10.0*  HCT 32.1*   < > 31.1*   < > 37.2*   < > 29.4* 24.0* 25.0* 30.0* 34.3*  MCV 87.7  --  89.4  --  90.7  --  90.7  --   --   --  92.0  PLT 145*  --  134*  --  259  --  128*  --   --   --  161   < > = values in this interval not displayed.    Basic Metabolic Panel: Recent Labs  Lab 09/04/19 0418 09/04/19 0418 09/05/19 0256 09/05/19 0256 09/06/19 0222 09/06/19 0222 09/07/19 0208 09/08/19 0033 09/09/19 0203 09/09/19 0900 09/09/19 1200 09/09/19 1200 09/09/19 1500 09/09/19 1500 09/09/19 1617 09/09/19 1617 09/09/19 2000 09/09/19 2340 09/10/19 0158 09/10/19 0430 09/10/19 0431 09/10/19 1109  NA 138   < > 138   < > 140   < > 138   < > 140  --  140   < > 143   < > 141  --  143 143 142 143  --   --   K 6.0*   < > 6.0*   < > 5.9*   < > 6.1*   < > 7.2*   < > >7.5*  8.0*   < > 7.5*   < >  7.2*   < > 5.9* 5.3*  5.3* 5.1 5.1  --  4.5  CL 105   < > 105   < > 106   < > 104   < > 106  --  104  --  104  --   --   --   --  108  --  106  --   --   CO2 24   < > 24   < > 26   < > 24   < > 24  --  28  --  27  --   --   --   --  28  --  27  --   --   GLUCOSE 211*   < > 193*   < > 145*   < > 236*   < > 116*  --  200*  --  255*  --   --   --   --  131*  --  61*  --   --   BUN 85*   < > 89*   < > 102*   < > 113*   < > 113*  --  124*  --  90*  --   --   --   --  96*  --  85*  --   --   CREATININE 2.20*   < > 2.34*   < > 2.33*   < > 2.37*   < > 2.24*  --  2.78*  --  2.97*  --   --   --   --  2.15*  --  1.85*  --   --   CALCIUM 7.7*   < > 7.5*   < > 7.4*   < > 7.2*   < > 7.4*  --  7.3*  --  7.9*  --   --   --   --  7.5*  --  7.6*  --   --   MG 2.9*  --  2.8*  --  3.0*  --  3.4*  --   --   --   --   --   --   --   --   --   --   --   --   --  3.0*  --   PHOS  --   --   --   --   --   --   --   --   --   --   --   --  9.5*  --   --   --   --  5.7*  --  5.1*  --   --    < > = values in this interval not displayed.   GFR: Estimated Creatinine  Clearance: 41.2 mL/min (A) (by C-G formula based on SCr of 1.85 mg/dL (H)). Recent Labs  Lab 09/05/19 0256 09/06/19 0222 09/08/19 0033 09/09/19 0203 09/09/19 1200 09/09/19 1500 09/10/19 0431  PROCALCITON 0.47  --   --   --   --   --   --   WBC 13.9*   < > 15.2* 48.1*  --  25.9* 45.4*  LATICACIDVEN  --   --   --   --  1.5 2.5*  --    < > = values in this interval not displayed.    Liver Function Tests: Recent Labs  Lab 09/09/19 1500 09/09/19 2340 09/10/19 0430  ALBUMIN 1.9* 2.1* 2.3*   No results for input(s):  LIPASE, AMYLASE in the last 168 hours. No results for input(s): AMMONIA in the last 168 hours.  ABG    Component Value Date/Time   PHART 7.238 (L) 09/10/2019 0158   PCO2ART 67.9 (HH) 09/10/2019 0158   PO2ART 53.0 (L) 09/10/2019 0158   HCO3 29.1 (H) 09/10/2019 0158   TCO2 31 09/10/2019 0158   ACIDBASEDEF 2.0 07/29/2016 1204   O2SAT 80.0 09/10/2019 0158     Coagulation Profile: No results for input(s): INR, PROTIME in the last 168 hours.  Cardiac Enzymes: No results for input(s): CKTOTAL, CKMB, CKMBINDEX, TROPONINI in the last 168 hours.  HbA1C: Hgb A1c MFr Bld  Date/Time Value Ref Range Status  09/23/2019 04:40 PM 5.7 (H) 4.8 - 5.6 % Final    Comment:    (NOTE) Pre diabetes:          5.7%-6.4% Diabetes:              >6.4% Glycemic control for   <7.0% adults with diabetes   04/20/2019 11:18 AM 5.9 4.6 - 6.5 % Final    Comment:    Glycemic Control Guidelines for People with Diabetes:Non Diabetic:  <6%Goal of Therapy: <7%Additional Action Suggested:  >8%     CBG: Recent Labs  Lab 09/10/19 0005 09/10/19 0344 09/10/19 0728 09/10/19 1132 09/10/19 1208  GLUCAP 147* 115* 91 66* 85      This patient is critically ill with multiple organ system failure which requires frequent high complexity decision making, assessment, support, evaluation, and titration of therapies. This was completed through the application of advanced monitoring technologies and  extensive interpretation of multiple databases. During this encounter critical care time was devoted to patient care services described in this note for 50 minutes.  [name], DO 09/10/19 1:01 PM [facility]

## 2019-09-10 NOTE — Progress Notes (Signed)
Patient placed in supine position by RT x 1 and RN x 4 without complications.

## 2019-09-10 NOTE — Progress Notes (Signed)
PHARMACY - PHYSICIAN COMMUNICATION CRITICAL VALUE ALERT - BLOOD CULTURE IDENTIFICATION (BCID)  Michael [name] is an 79 y.o. male who presented to [facility] on 09/06/2019 with COVID-19.  Previously treated treated for 7 days with cefazolin for staph epidermis/hominis bacteremia 2/2-2/8. Broad spectrum antibiotics resumed 2/13 for suspected HAP.  Assessment:  1 of 2 sets blood cultures growing GPC clusters, no BCID to be done unless 2nd set becomes positive.  Name of physician (or Provider) Contacted: Dr. Myna Hidalgo  Current antibiotics: vancomycin and cefepime  Changes to prescribed antibiotics recommended:  Patient is on recommended antibiotics - No changes needed  Peggyann Juba, PharmD, [place]: [phone] 09/10/2019  8:38 PM

## 2019-09-11 LAB — RENAL FUNCTION PANEL
Albumin: 1.7 g/dL — ABNORMAL LOW (ref 3.5–5.0)
Anion gap: 15 (ref 5–15)
BUN: 50 mg/dL — ABNORMAL HIGH (ref 8–23)
CO2: 20 mmol/L — ABNORMAL LOW (ref 22–32)
Calcium: 6.8 mg/dL — ABNORMAL LOW (ref 8.9–10.3)
Chloride: 104 mmol/L (ref 98–111)
Creatinine, Ser: 1.94 mg/dL — ABNORMAL HIGH (ref 0.61–1.24)
GFR calc Af Amer: 38 mL/min — ABNORMAL LOW (ref >60)
GFR calc non Af Amer: 33 mL/min — ABNORMAL LOW (ref >60)
Glucose, Bld: 98 mg/dL (ref 70–99)
Phosphorus: 7.3 mg/dL — ABNORMAL HIGH (ref 2.5–4.6)
Potassium: 5.3 mmol/L — ABNORMAL HIGH (ref 3.5–5.1)
Sodium: 139 mmol/L (ref 135–145)

## 2019-09-11 LAB — POCT I-STAT 7, (LYTES, BLD GAS, ICA,H+H)
Acid-base deficit: 14 mmol/L — ABNORMAL HIGH (ref 0.0–2.0)
Bicarbonate: 16 mmol/L — ABNORMAL LOW (ref 20.0–28.0)
Calcium, Ion: 0.96 mmol/L — ABNORMAL LOW (ref 1.15–1.40)
HCT: 21 % — ABNORMAL LOW (ref 39.0–52.0)
Hemoglobin: 7.1 g/dL — ABNORMAL LOW (ref 13.0–17.0)
O2 Saturation: 100 %
Patient temperature: 36.5
Potassium: 6.4 mmol/L (ref 3.5–5.1)
Sodium: 134 mmol/L — ABNORMAL LOW (ref 135–145)
TCO2: 18 mmol/L — ABNORMAL LOW (ref 22–32)
pCO2 arterial: 61.2 mmHg — ABNORMAL HIGH (ref 32.0–48.0)
pH, Arterial: 7.022 — CL (ref 7.350–7.450)
pO2, Arterial: 289 mmHg — ABNORMAL HIGH (ref 83.0–108.0)

## 2019-09-11 LAB — CBC
HCT: 29.8 % — ABNORMAL LOW (ref 39.0–52.0)
HCT: 27.1 % — ABNORMAL LOW (ref 39.0–52.0)
Hemoglobin: 8.4 g/dL — ABNORMAL LOW (ref 13.0–17.0)
Hemoglobin: 7.3 g/dL — ABNORMAL LOW (ref 13.0–17.0)
MCH: 26.9 pg (ref 26.0–34.0)
MCH: 27.1 pg (ref 26.0–34.0)
MCHC: 28.2 g/dL — ABNORMAL LOW (ref 30.0–36.0)
MCHC: 26.9 g/dL — ABNORMAL LOW (ref 30.0–36.0)
MCV: 95.5 fL (ref 80.0–100.0)
MCV: 100.7 fL — ABNORMAL HIGH (ref 80.0–100.0)
Platelets: 101 10*3/uL — ABNORMAL LOW (ref 150–400)
Platelets: 97 10*3/uL — ABNORMAL LOW (ref 150–400)
RBC: 3.12 MIL/uL — ABNORMAL LOW (ref 4.22–5.81)
RBC: 2.69 MIL/uL — ABNORMAL LOW (ref 4.22–5.81)
RDW: 17.6 % — ABNORMAL HIGH (ref 11.5–15.5)
RDW: 17.6 % — ABNORMAL HIGH (ref 11.5–15.5)
WBC: 37 10*3/uL — ABNORMAL HIGH (ref 4.0–10.5)
WBC: 39.3 10*3/uL — ABNORMAL HIGH (ref 4.0–10.5)
nRBC: 0.7 % — ABNORMAL HIGH (ref 0.0–0.2)
nRBC: 3.1 % — ABNORMAL HIGH (ref 0.0–0.2)

## 2019-09-11 LAB — COMPREHENSIVE METABOLIC PANEL
ALT: 4157 U/L — ABNORMAL HIGH (ref 0–44)
AST: >10000 U/L — ABNORMAL HIGH (ref 15–41)
Albumin: 1.3 g/dL — ABNORMAL LOW (ref 3.5–5.0)
Alkaline Phosphatase: 278 U/L — ABNORMAL HIGH (ref 38–126)
Anion gap: >20 — ABNORMAL HIGH (ref 5–15)
BUN: 45 mg/dL — ABNORMAL HIGH (ref 8–23)
CO2: 13 mmol/L — ABNORMAL LOW (ref 22–32)
Calcium: 7 mg/dL — ABNORMAL LOW (ref 8.9–10.3)
Chloride: 101 mmol/L (ref 98–111)
Creatinine, Ser: 2.07 mg/dL — ABNORMAL HIGH (ref 0.61–1.24)
GFR calc Af Amer: 35 mL/min — ABNORMAL LOW (ref >60)
GFR calc non Af Amer: 30 mL/min — ABNORMAL LOW (ref >60)
Glucose, Bld: 105 mg/dL — ABNORMAL HIGH (ref 70–99)
Potassium: 6.5 mmol/L (ref 3.5–5.1)
Sodium: 136 mmol/L (ref 135–145)
Total Bilirubin: 2.4 mg/dL — ABNORMAL HIGH (ref 0.3–1.2)
Total Protein: 3.9 g/dL — ABNORMAL LOW (ref 6.5–8.1)

## 2019-09-11 LAB — CULTURE, RESPIRATORY W GRAM STAIN

## 2019-09-11 LAB — GLUCOSE, CAPILLARY
Glucose-Capillary: 12 mg/dL — CL (ref 70–99)
Glucose-Capillary: 128 mg/dL — ABNORMAL HIGH (ref 70–99)
Glucose-Capillary: 231 mg/dL — ABNORMAL HIGH (ref 70–99)
Glucose-Capillary: 36 mg/dL — CL (ref 70–99)
Glucose-Capillary: 56 mg/dL — ABNORMAL LOW (ref 70–99)
Glucose-Capillary: 71 mg/dL (ref 70–99)
Glucose-Capillary: 79 mg/dL (ref 70–99)

## 2019-09-11 LAB — TROPONIN I (HIGH SENSITIVITY): Troponin I (High Sensitivity): 1095.48 ng/L (ref <18)

## 2019-09-11 LAB — MAGNESIUM: Magnesium: 2.9 mg/dL — ABNORMAL HIGH (ref 1.7–2.4)

## 2019-09-11 LAB — FERRITIN: Ferritin: >7500 ng/mL — ABNORMAL HIGH (ref 24–336)

## 2019-09-11 LAB — TRIGLYCERIDES: Triglycerides: 59 mg/dL (ref <150)

## 2019-09-11 LAB — C-REACTIVE PROTEIN: CRP: 23.1 mg/dL — ABNORMAL HIGH (ref <1.0)

## 2019-09-11 LAB — APTT: aPTT: 116 seconds — ABNORMAL HIGH (ref 24–36)

## 2019-09-11 LAB — HEPARIN LEVEL (UNFRACTIONATED): Heparin Unfractionated: 0.3 IU/mL (ref 0.30–0.70)

## 2019-09-11 LAB — LACTIC ACID, PLASMA: Lactic Acid, Venous: >11 mmol/L (ref 0.5–1.9)

## 2019-09-11 MED ORDER — MORPHINE SULFATE (PF) 2 MG/ML IV SOLN
2.0000 mg | INTRAVENOUS | Status: DC | PRN
  Administered 2019-09-11 (×2): 2 mg via INTRAVENOUS
  Filled 2019-09-11 (×2): qty 1

## 2019-09-11 MED ORDER — SODIUM BICARBONATE 8.4 % IV SOLN
100.0000 meq | Freq: Once | INTRAVENOUS | Status: AC
  Administered 2019-09-11: 100 meq via INTRAVENOUS

## 2019-09-11 MED ORDER — SODIUM BICARBONATE 8.4 % IV SOLN
INTRAVENOUS | Status: AC
  Filled 2019-09-11: qty 100

## 2019-09-11 MED ORDER — ACETAMINOPHEN 325 MG PO TABS: 650.0000 mg | ORAL_TABLET | Freq: Four times a day (QID) | ORAL | Status: DC | PRN

## 2019-09-11 MED ORDER — POLYVINYL ALCOHOL 1.4 % OP SOLN
1.0000 [drp] | Freq: Four times a day (QID) | OPHTHALMIC | Status: DC | PRN
  Filled 2019-09-11: qty 15

## 2019-09-11 MED ORDER — DIPHENHYDRAMINE HCL 50 MG/ML IJ SOLN: 25.0000 mg | INTRAMUSCULAR | Status: DC | PRN

## 2019-09-11 MED ORDER — HYDROCORTISONE NA SUCCINATE PF 100 MG IJ SOLR: 50.0000 mg | Freq: Four times a day (QID) | INTRAMUSCULAR | Status: DC

## 2019-09-11 MED ORDER — SODIUM CHLORIDE 0.9 % IV SOLN: 1.0000 g | Freq: Once | INTRAVENOUS | Status: DC

## 2019-09-11 MED ORDER — MORPHINE 100MG IN NS 100ML (1MG/ML) PREMIX INFUSION
0.0000 mg/h | INTRAVENOUS | Status: DC
  Filled 2019-09-11 (×2): qty 100

## 2019-09-11 MED ORDER — VASOPRESSIN 20 UNIT/ML IV SOLN
0.0400 [IU]/min | INTRAVENOUS | Status: DC
  Filled 2019-09-11: qty 2

## 2019-09-11 MED ORDER — DEXTROSE 50 % IV SOLN: 25.0000 g | INTRAVENOUS | Status: AC

## 2019-09-11 MED ORDER — DEXTROSE 50 % IV SOLN: 1.0000 | Freq: Once | INTRAVENOUS | Status: AC

## 2019-09-11 MED ORDER — MORPHINE BOLUS VIA INFUSION
5.0000 mg | INTRAVENOUS | Status: DC | PRN
  Filled 2019-09-11: qty 5

## 2019-09-11 MED ORDER — DEXTROSE 5 % IV SOLN: INTRAVENOUS | Status: DC

## 2019-09-11 MED ORDER — LORAZEPAM 2 MG/ML IJ SOLN
2.0000 mg | INTRAMUSCULAR | Status: DC | PRN
  Administered 2019-09-11: 12:00:00 4 mg via INTRAVENOUS
  Filled 2019-09-11: qty 2

## 2019-09-11 MED ORDER — DEXTROSE 50 % IV SOLN
INTRAVENOUS | Status: AC
  Administered 2019-09-11: 11:00:00 50 mL
  Filled 2019-09-11: qty 50

## 2019-09-11 MED ORDER — CALCIUM CHLORIDE 10 % IV SOLN
1.0000 g | Freq: Once | INTRAVENOUS | Status: AC
  Administered 2019-09-11: 10:00:00 1 g via INTRAVENOUS

## 2019-09-11 MED ORDER — DEXTROSE 50 % IV SOLN
INTRAVENOUS | Status: AC
  Administered 2019-09-11: 04:00:00 50 mL
  Filled 2019-09-11: qty 50

## 2019-09-11 MED ORDER — DEXTROSE 50 % IV SOLN
INTRAVENOUS | Status: AC
  Administered 2019-09-11: 08:00:00 50 mL
  Filled 2019-09-11: qty 50

## 2019-09-11 MED ORDER — GLYCOPYRROLATE 1 MG PO TABS
1.0000 mg | ORAL_TABLET | ORAL | Status: DC | PRN
  Filled 2019-09-11: qty 1

## 2019-09-11 MED ORDER — GLYCOPYRROLATE 0.2 MG/ML IJ SOLN: 0.2000 mg | INTRAMUSCULAR | Status: DC | PRN

## 2019-09-11 MED ORDER — ACETAMINOPHEN 650 MG RE SUPP: 650.0000 mg | Freq: Four times a day (QID) | RECTAL | Status: DC | PRN

## 2019-09-12 LAB — CULTURE, BLOOD (ROUTINE X 2): Special Requests: ADEQUATE

## 2019-09-14 LAB — CULTURE, BLOOD (ROUTINE X 2)
Culture: NO GROWTH
Special Requests: ADEQUATE

## 2019-09-25 NOTE — Progress Notes (Signed)
Pt supined at 0920 emergently due to bradycardia. ABG drawn at 0950 and results given to Dr Lake Bells. No new orders at this time. RT will continue to monitor.

## 2019-09-25 NOTE — Progress Notes (Signed)
Patient's blood pressure and HR continued to decline despite increasing his Neo and Levophed. Patient flipped back to supine and MD paged to the beside. 2 amps of Bicarb and 1 amp of Calcium given. 1 liter of LR bolused. HR and blood pressure improving. CBG 36. 1 amp of dextrose given. MD speaking with family regarding code status and goals of care.

## 2019-09-25 NOTE — Progress Notes (Signed)
NAME:  Michael [name], MRN:  [id number], DOB:  10/13/1940, LOS: 26 ADMISSION DATE:  09/06/2019, CONSULTATION DATE:  2/13 REFERRING MD:  Verlon Au, CHIEF COMPLAINT:  Hyperkalemia, encephalopathy   Brief History   79 y/o male admitted 2/1 in the setting of COVID pneumonia.  Oxygen requirements worsened and he developed AKI with hyperkalemia, moved to the ICU on 2/13 and intubated, started on CVVHD.  Past Medical History  DM2 Psoriasis AS HFpEF IDA HTN Hypothyroid C. Diff CKD 4 AVM  Significant Hospital Events   Upgrade to ICU, intubated 2/13  Consults:  Nephro  PCCM  Procedures:  Intubation 2/13 R IJ trialysis 2/13  Significant Diagnostic Tests:  Head CT 2/13: no acute bleeding CXR 2/13: progressive bilateral airspace disease CT chest 2/12: b/l diffuse GGOs, LLL posterior consolidation vs pleural thickening CT head 2/13: No acute abnormalities, no bleed Echo 2/13: LVEF 60 to 65%, severe LVH, no regional wall motion abnormalities.  Moderately dilated LA.  Normal RV size and function.  Moderate to severe AS, mild MS, mild TR.  Micro Data:  2/3 blood cx : NG, final 2/1 blood cx: staph epi (4/4) & staph hominis in 2/4 bottles 2/13 respiratory >> Pseudomonas >> 2/13 blood>> GPC 1/4, GNR 1/4  Antimicrobials:  remdesivir 2/1> 2/5 cefazolin 2/2>2/8 Vancomycin 2/13>> Cefepime 2/13>>  Interim history/subjective:  Worsening shock overnight Near arrest this morning, improved with vasopressors and sodium bicarbonate  Objective   Blood pressure (!) 82/49, pulse 76, temperature 97.7 F (36.5 C), temperature source Rectal, resp. rate (!) 26, height 5\' 11"  (1.803 m), weight 103.1 kg, SpO2 100 %.    Vent Mode: PRVC FiO2 (%):  [100 %] 100 % Set Rate:  [35 bmp] 35 bmp Vt Set:  [500 mL] 500 mL PEEP:  [12 cmH20] 12 cmH20 Plateau Pressure:  [28 cmH20-36 cmH20] 28 cmH20   Intake/Output Summary (Last 24 hours) at 2019/09/21 0901 Last data filed at 09-21-19 0900 Gross per 24 hour    Intake 2840.36 ml  Output 2521 ml  Net 319.36 ml   Filed Weights   09/10/19 0348 09/10/19 0445 2019-09-21 0417  Weight: 101.6 kg 101.6 kg 103.1 kg    Examination: General:  In bed on vent HENT: NCAT ETT in place PULM: CTA B, vent supported breathing CV: RRR, no mgr GI: BS+, soft, nontender MSK: normal bulk and tone Neuro: sedated on vent  CXR 2/13 > severe bilateral airspace disease, ett in place, R IJ In place  Resolved Hospital Problem list    Assessment & Plan:  Acute metabolic encephalopathy Sedation need Continue cvvhd Fentanyl titrated to RASS -1 to -2 Hold propofol given shock  ARDS due to COVID 19 and pseudomonas pneumonia Continue mechanical ventilation per ARDS protocol Target TVol 6-8cc/kgIBW Target Plateau Pressure < 30cm H20 Target driving pressure less than 15 cm of water Target PaO2 55-65: titrate PEEP/FiO2 per protocol As long as PaO2 to FiO2 ratio is less than 1:150 position in prone position for 16 hours a day Check CVP daily if CVL in place Target CVP less than 4, diurese as necessary Ventilator associated pneumonia prevention protocol 2/15 supine, abg, stop solumedrol, start hydrocortisone  Hyperkalemia AKI Baseline CKD, didn't want dialysis Continue CRRT Monitor UOP  Septic shock, near arrest, bradycardia Staph epidermidis bacteremia Pseudomonas pneumonia Bolus 1 liter LR Add vasopressin Add hydrocortisone Wean off levophed and neosynephrine for MAP > 65 vanc per pharmacy Cefepime per pharmacy  Bilateral LE DVT Hold heparin given shock, near arrest Check stat CBC  to ensure no bleeding  Moderate to severe AS Tele Monitor hemodynamics  Overall prognosis is poor, this is not survivable.  Give D50, vasopressors now, have family visit, withdraw care later today. DNR code status now  Best practice:  Diet: tube feeding Pain/Anxiety/Delirium protocol (if indicated): as above VAP protocol (if indicated): yes DVT prophylaxis: heparin >  holding temporarily 2/15 GI prophylaxis: Pantoprazole for stress ulcer prophylaxis Glucose control: SSI Mobility: bed rest Code Status: full Family Communication: I updated his [name] and advised that they withdraw care today Disposition: as above  Labs   CBC: Recent Labs  Lab 09/08/19 0033 09/08/19 0033 09/09/19 0203 09/09/19 0203 09/09/19 1500 09/09/19 1617 09/10/19 0158 09/10/19 0431 09/10/19 1758 09/10/19 2011 2019/10/07 0508  WBC 15.2*  --  48.1*  --  25.9*  --   --  45.4*  --   --  37.0*  NEUTROABS  --   --   --   --  24.9*  --   --   --   --   --   --   HGB 9.3*   < > 11.2*   < > 8.9*   < > 10.2* 10.0* 9.2* 9.2* 8.4*  HCT 31.1*   < > 37.2*   < > 29.4*   < > 30.0* 34.3* 27.0* 27.0* 29.8*  MCV 89.4  --  90.7  --  90.7  --   --  92.0  --   --  95.5  PLT 134*  --  259  --  128*  --   --  161  --   --  101*   < > = values in this interval not displayed.    Basic Metabolic Panel: Recent Labs  Lab 09/05/19 0256 09/05/19 0256 09/06/19 0222 09/06/19 0222 09/07/19 0208 09/08/19 0033 09/09/19 1500 09/09/19 1617 09/09/19 2340 09/10/19 0158 09/10/19 0430 09/10/19 0431 09/10/19 1109 09/10/19 1612 09/10/19 1758 09/10/19 1953 09/10/19 2011 2019/10/07 0508  NA 138   < > 140   < > 138   < > 143   < > 143   < > 143  --   --  137 136  --  136 139  K 6.0*   < > 5.9*   < > 6.1*   < > 7.5*   < > 5.3*  5.3*   < > 5.1  --    < > 5.2* 5.3* 5.6* 5.6* 5.3*  CL 105   < > 106   < > 104   < > 104  --  108  --  106  --   --  103  --   --   --  104  CO2 24   < > 26   < > 24   < > 27  --  28  --  27  --   --  26  --   --   --  20*  GLUCOSE 193*   < > 145*   < > 236*   < > 255*  --  131*  --  61*  --   --  83  --   --   --  98  BUN 89*   < > 102*   < > 113*   < > 90*  --  96*  --  85*  --   --  63*  --   --   --  50*  CREATININE 2.34*   < > 2.33*   < >  2.37*   < > 2.97*  --  2.15*  --  1.85*  --   --  1.80*  --   --   --  1.94*  CALCIUM 7.5*   < > 7.4*   < > 7.2*   < > 7.9*  --  7.5*   --  7.6*  --   --  6.9*  --   --   --  6.8*  MG 2.8*  --  3.0*  --  3.4*  --   --   --   --   --   --  3.0*  --   --   --   --   --  2.9*  PHOS  --   --   --   --   --   --  9.5*  --  5.7*  --  5.1*  --   --  5.5*  --   --   --  7.3*   < > = values in this interval not displayed.   GFR: Estimated Creatinine Clearance: 39.6 mL/min (A) (by C-G formula based on SCr of 1.94 mg/dL (H)). Recent Labs  Lab 09/05/19 0256 09/06/19 0222 09/09/19 0203 09/09/19 1200 09/09/19 1500 09/10/19 0431 10/03/2019 0508  PROCALCITON 0.47  --   --   --   --   --   --   WBC 13.9*   < > 48.1*  --  25.9* 45.4* 37.0*  LATICACIDVEN  --   --   --  1.5 2.5*  --   --    < > = values in this interval not displayed.    Liver Function Tests: Recent Labs  Lab 09/09/19 1500 09/09/19 2340 09/10/19 0430 09/10/19 1612 03-Oct-2019 0508  ALBUMIN 1.9* 2.1* 2.3* 1.8* 1.7*   No results for input(s): LIPASE, AMYLASE in the last 168 hours. No results for input(s): AMMONIA in the last 168 hours.  ABG    Component Value Date/Time   PHART 7.244 (L) 09/10/2019 2011   PCO2ART 53.9 (H) 09/10/2019 2011   PO2ART 110.0 (H) 09/10/2019 2011   HCO3 23.5 09/10/2019 2011   TCO2 25 09/10/2019 2011   ACIDBASEDEF 4.0 (H) 09/10/2019 2011   O2SAT 97.0 09/10/2019 2011     Coagulation Profile: No results for input(s): INR, PROTIME in the last 168 hours.  Cardiac Enzymes: No results for input(s): CKTOTAL, CKMB, CKMBINDEX, TROPONINI in the last 168 hours.  HbA1C: Hgb A1c MFr Bld  Date/Time Value Ref Range Status  09/04/2019 04:40 PM 5.7 (H) 4.8 - 5.6 % Final    Comment:    (NOTE) Pre diabetes:          5.7%-6.4% Diabetes:              >6.4% Glycemic control for   <7.0% adults with diabetes   04/20/2019 11:18 AM 5.9 4.6 - 6.5 % Final    Comment:    Glycemic Control Guidelines for People with Diabetes:Non Diabetic:  <6%Goal of Therapy: <7%Additional Action Suggested:  >8%     CBG: Recent Labs  Lab 09/10/19 1612  09/10/19 1939 09/10/19 2355 10-03-2019 0345 10-03-19 0412  GLUCAP 81 102* 71 56* 128*    Critical care time: 45 minutes    Roselie Awkward, MD [facility] Pager: [phone] Cell: [phone] If no response, call [phone]

## 2019-09-25 NOTE — Progress Notes (Signed)
Family was able to visit with the patient. He is back in his room and we clarified with the family that they did not want to do a video visit. We will proceed with terminal extubation and comfort care. Thayer Ohm D

## 2019-09-25 NOTE — Progress Notes (Signed)
Patient CBG 56 mg/dL. Treated with 1 amp of D50 per protocol.

## 2019-09-25 NOTE — Progress Notes (Signed)
Pts head and arms repositioned. ETT remains secure at 25cm. Pt with bloody oral secretions. RT will continue to monitor.

## 2019-09-25 NOTE — Procedures (Signed)
Arterial Line Insertion Procedure Note Michael [name] [id number] 1940/11/29  Procedure: Insertion of Arterial Line Indications: Blood pressure monitoring  Procedure Details Consent: Risks of procedure as well as the alternatives and risks of each were explained to the (patient/caregiver).  Consent for procedure obtained. Time Out: Verified patient identification, verified procedure, site/side was marked, verified correct patient position, special equipment/implants available, medications/allergies/relevent history reviewed, required imaging and test results available.  Performed  Maximum sterile technique was used including antiseptics, cap, gloves, gown, hand hygiene, mask and sheet. Skin prep: Chlorhexidine; local anesthetic administered A single lumen arterial catheter was placed in the R radial artery using the Seldinger technique.  Ultrasound was used to verify the patency of the vein and for real time needle guidance.  Evaluation Blood flow good Complications: No apparent complications Patient did tolerate procedure well.  Roselie Awkward 07-Oct-2019, 10:00 AM

## 2019-09-25 NOTE — Progress Notes (Signed)
Pt's head repositioned and arms rotated with no complications.  ETT secured with cloth tape

## 2019-09-25 NOTE — Death Summary Note (Signed)
DEATH SUMMARY   Patient Details  Name: Michael [name] MRN: [id number] DOB: [birth year]  Admission/Discharge Information   Admit Date:  2019-09-18  Date of Death: Date of Death: October 02, 2019  Time of Death: Time of Death: 1226-11-07  Length of Stay: 10-29-22  Referring Physician: Cassandria Anger, MD   Reason(s) for Hospitalization  COVID 19  Diagnoses  Preliminary cause of death:  Secondary Diagnoses (including complications and co-morbidities):  Principal Problem:   COVID-19 virus infection Active Problems:   DM (diabetes mellitus), type 2 with peripheral vascular complications ([place])   AVM (arteriovenous malformation) of small bowel, acquired with hemorrhage   Iron deficiency anemia due to chronic blood loss   Gout   Persistent atrial fibrillation   Hyperlipidemia   Hypothyroidism   Shortness of breath   Chronic diastolic CHF (congestive heart failure) (H[facility])   CKD (chronic kidney disease), stage III   Dyspnea   Positive blood cultures   Acute respiratory failure with hypoxia [facility])   Brief Hospital Course (including significant findings, care, treatment, and services provided and events leading to death)  Michael [name] is a 79 year old gentleman admitted to [facility] hospital on 2/1 with COVID-19 pneumonia.  He had upper respiratory cold-like symptoms, cough, shortness of breath, weakness prior to admission.  He had been eating and drinking minimally and not taking his meds.  At admission he was requiring 2 L supplemental oxygen.  Earlier in his admission he was has been treated for 7 days with cefazolin for staph epidermis bacteremia.  For the last several days he has had increasing oxygen requirements.  He has CKD 4 with an AKI and hyperkalemia.  Today his potassium rapidly rose to 7.2, repeated was 8.  He became increasingly encephalopathic and was unable to take p.o. medications.  A rapid response was called when he was nonresponsive.  Overnight 2/13 he had received trazodone. On admission to the  ICU he was started on CRRT and intubated.  He was treated with broad spectrum antibiotics IV fluids and vasopressors.  Despite this his condition worsened and he became more hypotensive.  We discussed his situation and overall poor prognosis with family and they were able to visit him before he passed.    Pertinent Labs and Studies  Significant Diagnostic Studies DG Chest 2 View  Result Date: 09/07/2019 CLINICAL DATA:  Follow-up pneumonia. EXAM: CHEST - 2 VIEW COMPARISON:  09/07/2019 at 5:54 a.m. FINDINGS: Bilateral interstitial and hazy airspace lung opacities are without change from the earlier study. Lung volumes remain low. No convincing pleural effusion.  No pneumothorax. IMPRESSION: 1. No significant change from the earlier exam. 2. Persistent bilateral interstitial and hazy airspace lung opacities. Findings consistent with multifocal pneumonia. Electronically Signed   By: Lajean Manes M.D.   On: 09/07/2019 17:03   DG Abd 1 View  Result Date: 09/09/2019 CLINICAL DATA:  OG tube confirmation EXAM: ABDOMEN - 1 VIEW COMPARISON:  None. FINDINGS: The nasogastric tube side port projects beyond the GE junction over the stomach. No dilated loops of bowel in the partially visualized abdomen. There are bibasilar pulmonary opacities better evaluated on concurrent chest radiograph. IMPRESSION: Nasogastric tube side port projects appropriately over the stomach. Electronically Signed   By: Audie Pinto M.D.   On: 09/09/2019 17:59   CT HEAD WO CONTRAST  Result Date: 09/09/2019 CLINICAL DATA:  Mental status change, unknown cause on full anticoagulation. EXAM: CT HEAD WITHOUT CONTRAST TECHNIQUE: Contiguous axial images were obtained from the base of the skull through the  vertex without intravenous contrast. COMPARISON:  Head CT 06/10/2016 FINDINGS: Brain: Mildly motion degraded examination. No evidence of acute intracranial hemorrhage. No demarcated cortical infarction. No evidence of intracranial mass. No  midline shift or extra-axial fluid collection. Mild ill-defined hypoattenuation within the cerebral white matter is nonspecific, but consistent with chronic small vessel ischemic disease. Moderate generalized parenchymal atrophy Vascular: No hyperdense vessel.  Atherosclerotic calcifications Skull: Normal. Negative for fracture or focal lesion. Sinuses/Orbits: Visualized orbits demonstrate no acute abnormality. No significant paranasal sinus disease or mastoid effusion. Partially visualized support tubes. IMPRESSION: Mildly motion degraded examination. No CT evidence of acute intracranial abnormality. Mild generalized parenchymal atrophy and chronic small vessel ischemic disease. Electronically Signed   By: Kellie Simmering DO   On: 09/09/2019 14:19   CT CHEST WO CONTRAST  Result Date: 09/08/2019 CLINICAL DATA:  Pneumonia. EXAM: CT CHEST WITHOUT CONTRAST TECHNIQUE: Multidetector CT imaging of the chest was performed following the standard protocol without IV contrast. COMPARISON:  Dec 13, 2014. FINDINGS: Cardiovascular: Atherosclerosis of thoracic aorta is noted without aneurysm formation. Mild cardiomegaly is noted. Coronary artery calcifications are noted. No pericardial effusion is noted. Mediastinum/Nodes: No enlarged mediastinal or axillary lymph nodes. Thyroid gland, trachea, and esophagus demonstrate no significant findings. Lungs/Pleura: No pneumothorax or significant pleural effusion is noted. Severe diffuse interstitial and airspace densities are noted throughout both lungs most consistent with diffuse pneumonia. Mild left posterior basilar subsegmental atelectasis is noted. Upper Abdomen: No acute abnormality. Musculoskeletal: No chest wall mass or suspicious bone lesions identified. IMPRESSION: 1. Severe diffuse interstitial and airspace densities are noted throughout both lungs most consistent with diffuse pneumonia. 2. Coronary artery calcifications are noted suggesting coronary artery disease. Aortic  Atherosclerosis (ICD10-I70.0). Electronically Signed   By: Marijo Conception M.D.   On: 09/08/2019 12:42   US RENAL  Result Date: 08/30/2019 CLINICAL DATA:  Chronic renal disease, COVID-19 positivity EXAM: RENAL / URINARY TRACT ULTRASOUND COMPLETE COMPARISON:  03/18/2016 FINDINGS: Right Kidney: Renal measurements: 12.6 x 5.8 x 5.4 cm. = volume: 208 mL. Mild cortical thinning is noted. Normal echogenicity is seen. Left Kidney: Renal measurements: 13.4 x 5.3 x 5.2 cm. = volume: 195 mL. Mild cortical thinning is noted. Normal echogenicity is seen. 1.4 mm cyst is noted within the mid to lower left kidney. Bladder: Appears normal for degree of bladder distention. Other: None. IMPRESSION: Left renal cyst. Mild cortical thinning bilaterally. Electronically Signed   By: Inez Catalina M.D.   On: 08/30/2019 19:09   DG CHEST PORT 1 VIEW  Result Date: 09/09/2019 CLINICAL DATA:  Status post intubation and central line placement EXAM: PORTABLE CHEST 1 VIEW COMPARISON:  Film from earlier in the same day. FINDINGS: Cardiac shadow is stable. Aortic calcifications are again seen. Diffuse infiltrate is again noted throughout both lungs stable from the prior exam consistent with the given clinical history. Endotracheal tube and right jugular central line are noted in satisfactory position. No pneumothorax is seen. IMPRESSION: Changes consistent with the given clinical history of COVID-19 positivity. Tubes and lines as described above. No evidence of pneumothorax. Electronically Signed   By: Inez Catalina M.D.   On: 09/09/2019 15:33   DG CHEST PORT 1 VIEW  Result Date: 09/09/2019 CLINICAL DATA:  Follow-up pneumonia, COVID positive. EXAM: PORTABLE CHEST 1 VIEW COMPARISON:  Chest x-rays dated 09/07/2019. Chest CT dated 09/08/2019. FINDINGS: Diffuse bilateral airspace opacities are stable compared to the chest CT of 09/08/2019, increased in degree compared to the earlier chest x-rays of 09/07/2019. No pneumothorax is  seen. Aortic  atherosclerosis. IMPRESSION: Diffuse bilateral airspace opacities, compatible with the given diagnosis of COVID-19 pneumonia, stable compared to yesterday's chest CT, progressed compared to chest x-rays of 09/07/2019. Electronically Signed   By: Franki Cabot M.D.   On: 09/09/2019 10:31   DG Chest Port 1 View  Result Date: 09/07/2019 CLINICAL DATA:  Pneumonia.  Follow-up exam. EXAM: PORTABLE CHEST 1 VIEW COMPARISON:  09/04/2019 FINDINGS: Bilateral interstitial and hazy airspace opacities are without significant change from the previous exam allowing for differences in radiographic technique and patient positioning. No new lung abnormalities.  No pleural effusion or pneumothorax. IMPRESSION: 1. No significant change in the bilateral interstitial and hazy airspace opacities when compared to the most recent prior exam. Findings consistent with multifocal pneumonia. Electronically Signed   By: Lajean Manes M.D.   On: 09/07/2019 08:45   DG Chest Port 1 View  Result Date: 09/04/2019 CLINICAL DATA:  Weakness EXAM: PORTABLE CHEST 1 VIEW COMPARISON:  09/10/2019 FINDINGS: Cardiac shadow is prominent. Aortic calcifications are again seen. Increasing airspace opacities are noted throughout both lungs when compared with the prior exam. This is again consistent with multifocal pneumonia consistent with the given clinical history. No bony abnormality is seen. IMPRESSION: Diffuse bilateral airspace opacities increased from the prior exam but consistent with the given clinical history. Electronically Signed   By: Inez Catalina M.D.   On: 09/04/2019 08:59   DG Chest Port 1 View  Result Date: 09/24/2019 CLINICAL DATA:  79 year old male with a history of shortness of breath EXAM: PORTABLE CHEST 1 VIEW COMPARISON:  01/31/2011 FINDINGS: Cardiomediastinal silhouette unchanged in size and contour. Low lung volumes with new mixed interstitial and airspace disease of the right lung, and to a lesser degree the lower left lung. No  pneumothorax or large pleural effusion. IMPRESSION: New right greater than left mixed interstitial and airspace disease, suggesting multifocal pneumonia. Electronically Signed   By: Corrie Mckusick D.O.   On: 09/09/2019 11:40   ECHOCARDIOGRAM COMPLETE  Result Date: 09/09/2019    ECHOCARDIOGRAM REPORT   Patient Name:   FAYETTE HAMADA Date of Exam: 09/09/2019 Medical Rec #:  [id number]      Height:       71.0 in Accession #:    [id number]     Weight:       244.0 lb Date of Birth:  1941/05/17       BSA:          2.29 m Patient Age:    11 years       BP:           145/59 mmHg Patient Gender: M              HR:           60 bpm. Exam Location:  Inpatient Procedure: 2D Echo, Cardiac Doppler and Color Doppler Indications:    Bacteremia  History:        Patient has prior history of Echocardiogram examinations, most                 recent 01/04/2019. CHF, Abnormal ECG, Aortic Valve Disease,                 Arrythmias:Atrial Fibrillation, Signs/Symptoms:Chest Pain,                 Dyspnea and Bacteremia; Risk Factors:Diabetes. Covid 19                 positive. Respiratory failure.  Sonographer:    Radene Gunning Referring Phys: [id number] [facility]  Sonographer Comments: Suboptimal apical window, echo performed with patient supine and on artificial respirator and Technically difficult study due to poor echo windows. Image acquisition challenging due to patient body habitus. IMPRESSIONS  1. Left ventricular ejection fraction, by estimation, is 60 to 65%. The left ventricle has normal function. The left ventricle has no regional wall motion abnormalities. There is severely increased left ventricular hypertrophy. Left ventricular diastolic parameters are indeterminate.  2. Right ventricular systolic function is normal. The right ventricular size is normal.  3. Left atrial size was moderately dilated.  4. The mitral valve is normal in structure and function. Mild mitral valve regurgitation. No evidence of mitral stenosis.  5. The  aortic valve is tricuspid. Aortic valve regurgitation is not visualized. Moderate to severe aortic valve stenosis.  6. The inferior vena cava is normal in size with greater than 50% respiratory variability, suggesting right atrial pressure of 3 mmHg. FINDINGS  Left Ventricle: Left ventricular ejection fraction, by estimation, is 60 to 65%. The left ventricle has normal function. The left ventricle has no regional wall motion abnormalities. The left ventricular internal cavity size was normal in size. There is  severely increased left ventricular hypertrophy. Left ventricular diastolic parameters are indeterminate. Right Ventricle: The right ventricular size is normal. No increase in right ventricular wall thickness. Right ventricular systolic function is normal. Left Atrium: Left atrial size was moderately dilated. Right Atrium: Right atrial size was normal in size. Pericardium: There is no evidence of pericardial effusion. Mitral Valve: The mitral valve is normal in structure and function. There is moderate thickening of the mitral valve leaflet(s). There is moderate calcification of the mitral valve leaflet(s). Normal mobility of the mitral valve leaflets. Moderate mitral  annular calcification. Mild mitral valve regurgitation. No evidence of mitral valve stenosis. MV peak gradient, 9.5 mmHg. The mean mitral valve gradient is 4.0 mmHg. Tricuspid Valve: The tricuspid valve is normal in structure. Tricuspid valve regurgitation is mild . No evidence of tricuspid stenosis. Aortic Valve: The aortic valve is tricuspid. . There is severe thickening and severe calcifcation of the aortic valve. Aortic valve regurgitation is not visualized. Moderate to severe aortic stenosis is present. Severe aortic valve annular calcification.  There is severe thickening of the aortic valve. There is severe calcifcation of the aortic valve. Aortic valve mean gradient measures 25.0 mmHg. Aortic valve peak gradient measures 49.8 mmHg.  Aortic valve area, by VTI measures 1.12 cm. Pulmonic Valve: The pulmonic valve was normal in structure. Pulmonic valve regurgitation is not visualized. No evidence of pulmonic stenosis. Aorta: The aortic root is normal in size and structure. Venous: The inferior vena cava is normal in size with greater than 50% respiratory variability, suggesting right atrial pressure of 3 mmHg. IAS/Shunts: No atrial level shunt detected by color flow Doppler.  LEFT VENTRICLE PLAX 2D LVIDd:         4.34 cm     Diastology LVIDs:         2.89 cm     LV e' lateral:   4.70 cm/s LV PW:         1.58 cm     LV E/e' lateral: 10.2 LV IVS:        1.82 cm     LV e' medial:    3.73 cm/s LVOT diam:     2.00 cm     LV E/e' medial:  12.8 LV SV:  78.23 ml LV SV Index:   22.15 LVOT Area:     3.14 cm  LV Volumes (MOD) LV vol d, MOD A2C: 80.3 ml LV vol d, MOD A4C: 85.9 ml LV vol s, MOD A2C: 35.2 ml LV vol s, MOD A4C: 29.0 ml LV SV MOD A2C:     45.1 ml LV SV MOD A4C:     85.9 ml LV SV MOD BP:      51.0 ml RIGHT VENTRICLE             IVC RV S prime:     11.40 cm/s  IVC diam: 2.09 cm TAPSE (M-mode): 1.8 cm LEFT ATRIUM             Index       RIGHT ATRIUM           Index LA diam:        4.90 cm 2.14 cm/m  RA Area:     20.00 cm LA Vol (A2C):   73.4 ml 31.99 ml/m RA Volume:   51.70 ml  22.53 ml/m LA Vol (A4C):   78.4 ml 34.17 ml/m LA Biplane Vol: 78.2 ml 34.08 ml/m  AORTIC VALVE AV Area (Vmax):    1.09 cm AV Area (Vmean):   1.13 cm AV Area (VTI):     1.12 cm AV Vmax:           353.00 cm/s AV Vmean:          229.800 cm/s AV VTI:            0.697 m AV Peak Grad:      49.8 mmHg AV Mean Grad:      25.0 mmHg LVOT Vmax:         122.00 cm/s LVOT Vmean:        82.500 cm/s LVOT VTI:          0.249 m LVOT/AV VTI ratio: 0.36  AORTA Ao Root diam: 3.20 cm MITRAL VALVE MV Area (PHT): 1.91 cm     SHUNTS MV Peak grad:  9.5 mmHg     Systemic VTI:  0.25 m MV Mean grad:  4.0 mmHg     Systemic Diam: 2.00 cm MV Vmax:       1.54 m/s MV Vmean:      91.2 cm/s MV  Decel Time: 398 msec MV E velocity: 47.82 cm/s MV A velocity: 120.00 cm/s MV E/A ratio:  0.40 Jenkins Rouge MD Electronically signed by Jenkins Rouge MD Signature Date/Time: 09/09/2019/4:35:02 PM    Final    VAS [name] LOWER EXTREMITY VENOUS (DVT)  Result Date: 09/08/2019  Lower Venous DVTStudy Indications: Swelling.  Risk Factors: COVID 19 positive. Comparison Study: 08/30/2019 - Negative for DVT. Performing Technologist: Oda Cogan RDMS, RVT  Examination Guidelines: A complete evaluation includes B-mode imaging, spectral Doppler, color Doppler, and power Doppler as needed of all accessible portions of each vessel. Bilateral testing is considered an integral part of a complete examination. Limited examinations for reoccurring indications may be performed as noted. The reflux portion of the exam is performed with the patient in reverse Trendelenburg.  +---------+---------------+---------+-----------+----------+--------------+ RIGHT    CompressibilityPhasicitySpontaneityPropertiesThrombus Aging +---------+---------------+---------+-----------+----------+--------------+ CFV      Full           Yes      Yes                                 +---------+---------------+---------+-----------+----------+--------------+  SFJ      Full                                                        +---------+---------------+---------+-----------+----------+--------------+ FV Prox  Full                                                        +---------+---------------+---------+-----------+----------+--------------+ FV Mid   Full                                                        +---------+---------------+---------+-----------+----------+--------------+ FV DistalFull                                                        +---------+---------------+---------+-----------+----------+--------------+ PFV      Full                                                         +---------+---------------+---------+-----------+----------+--------------+ POP      Full           Yes      Yes                                 +---------+---------------+---------+-----------+----------+--------------+ PTV      Full                                                        +---------+---------------+---------+-----------+----------+--------------+ PERO     None                                         Acute          +---------+---------------+---------+-----------+----------+--------------+   +---------+---------------+---------+-----------+----------+--------------+ LEFT     CompressibilityPhasicitySpontaneityPropertiesThrombus Aging +---------+---------------+---------+-----------+----------+--------------+ CFV      Full           Yes      Yes                                 +---------+---------------+---------+-----------+----------+--------------+ SFJ      Full                                                        +---------+---------------+---------+-----------+----------+--------------+  FV Prox  Full                                                        +---------+---------------+---------+-----------+----------+--------------+ FV Mid   Full                                                        +---------+---------------+---------+-----------+----------+--------------+ FV DistalFull                                                        +---------+---------------+---------+-----------+----------+--------------+ PFV      Full                                                        +---------+---------------+---------+-----------+----------+--------------+ POP      Full           Yes      Yes                                 +---------+---------------+---------+-----------+----------+--------------+ PTV      Partial                                      Acute           +---------+---------------+---------+-----------+----------+--------------+ PERO     Full                                                        +---------+---------------+---------+-----------+----------+--------------+     Summary: RIGHT: - Findings consistent with acute deep vein thrombosis involving the right peroneal veins. - No cystic structure found in the popliteal fossa.  LEFT: - Findings consistent with acute deep vein thrombosis involving the left posterior tibial veins. - No cystic structure found in the popliteal fossa.  *See table(s) above for measurements and observations. Electronically signed by Monica Martinez MD on 09/08/2019 at 52:14:16 PM.    Final    VAS [name] LOWER EXTREMITY VENOUS (DVT)  Result Date: 08/30/2019  Lower Venous DVTStudy Indications: SOB. Other Indications: Covid+. Risk Factors: Obesity and A fib, DM, HTN, HLD, CAD. Limitations: Body habitus and poor ultrasound/tissue interface. Comparison Study: No prior exam. Performing Technologist: Baldwin Crown ARDMS, RVT  Examination Guidelines: A complete evaluation includes B-mode imaging, spectral Doppler, color Doppler, and power Doppler as needed of all accessible portions of each vessel. Bilateral testing is considered an integral part of a complete examination. Limited examinations for reoccurring indications may be performed as noted. The reflux portion of the exam is performed with the patient  in reverse Trendelenburg.  +---------+---------------+---------+-----------+----------+--------------+ RIGHT    CompressibilityPhasicitySpontaneityPropertiesThrombus Aging +---------+---------------+---------+-----------+----------+--------------+ CFV      Full           Yes      Yes                                 +---------+---------------+---------+-----------+----------+--------------+ SFJ      Full                                                         +---------+---------------+---------+-----------+----------+--------------+ FV Prox  Full                                                        +---------+---------------+---------+-----------+----------+--------------+ FV Mid   Full                                                        +---------+---------------+---------+-----------+----------+--------------+ FV DistalFull                                                        +---------+---------------+---------+-----------+----------+--------------+ PFV      Full                                                        +---------+---------------+---------+-----------+----------+--------------+ POP      Full           Yes      Yes                                 +---------+---------------+---------+-----------+----------+--------------+ PTV      Full                                                        +---------+---------------+---------+-----------+----------+--------------+ PERO     Full                                                        +---------+---------------+---------+-----------+----------+--------------+   +---------+---------------+---------+-----------+----------+--------------+ LEFT     CompressibilityPhasicitySpontaneityPropertiesThrombus Aging +---------+---------------+---------+-----------+----------+--------------+ CFV      Full           Yes      Yes                                 +---------+---------------+---------+-----------+----------+--------------+  SFJ      Full                                                        +---------+---------------+---------+-----------+----------+--------------+ FV Prox  Full                                                        +---------+---------------+---------+-----------+----------+--------------+ FV Mid   Full                                                         +---------+---------------+---------+-----------+----------+--------------+ FV DistalFull                                                        +---------+---------------+---------+-----------+----------+--------------+ PFV      Full                                                        +---------+---------------+---------+-----------+----------+--------------+ POP      Full           Yes      Yes                                 +---------+---------------+---------+-----------+----------+--------------+ PTV      Full                                                        +---------+---------------+---------+-----------+----------+--------------+ PERO     Full                                                        +---------+---------------+---------+-----------+----------+--------------+ Poorly visualized bilateral calf veins due to body habitus,    Summary: RIGHT: - There is no evidence of deep vein thrombosis in the lower extremity.  - No cystic structure found in the popliteal fossa.  LEFT: - There is no evidence of deep vein thrombosis in the lower extremity.  - No cystic structure found in the popliteal fossa.  *See table(s) above for measurements and observations. Electronically signed by Curt Jews MD on 08/30/2019 at 5:09:57 PM.    Final     Microbiology Recent Results (from the past 240 hour(s))  Culture, respiratory (non-expectorated)     Status: None   Collection Time: 09/09/19  1:36 PM   Specimen: Tracheal Aspirate; Respiratory  Result Value Ref Range Status   Specimen Description   Final    TRACHEAL ASPIRATE Performed at [facility] [address], [place], [place] [address]    Special Requests   Final    NONE Performed at [facility], [place] [address] [place], [place] [address]    Gram Stain   Final    FEW WBC PRESENT, PREDOMINANTLY PMN ABUNDANT GRAM POSITIVE COCCI IN CLUSTERS ABUNDANT GRAM POSITIVE COCCI IN  PAIRS AND CHAINS RARE GRAM NEGATIVE RODS FEW GRAM POSITIVE RODS NO SQUAMOUS EPITHELIAL CELLS PRESENT Performed at [facility], [place] [address], [place], [place] [address]    Culture ABUNDANT PSEUDOMONAS AERUGINOSA  Final   Report Status Oct 05, 2019 FINAL  Final   Organism ID, Bacteria PSEUDOMONAS AERUGINOSA  Final      Susceptibility   Pseudomonas aeruginosa - MIC*    CEFTAZIDIME 2 SENSITIVE Sensitive     CIPROFLOXACIN <=0.25 SENSITIVE Sensitive     GENTAMICIN <=1 SENSITIVE Sensitive     IMIPENEM 1 SENSITIVE Sensitive     PIP/TAZO 8 SENSITIVE Sensitive     CEFEPIME 2 SENSITIVE Sensitive     * ABUNDANT PSEUDOMONAS AERUGINOSA  MRSA PCR Screening     Status: None   Collection Time: 09/09/19  2:31 PM   Specimen: Nasopharyngeal  Result Value Ref Range Status   MRSA by PCR NEGATIVE NEGATIVE Final    Comment:        The GeneXpert MRSA Assay (FDA approved for NASAL specimens only), is one component of a comprehensive MRSA colonization surveillance program. It is not intended to diagnose MRSA infection nor to guide or monitor treatment for MRSA infections. Performed at [facility], [place] [address], [place], [place] [address]   Culture, blood (routine x 2)     Status: None (Preliminary result)   Collection Time: 09/09/19  2:52 PM   Specimen: BLOOD  Result Value Ref Range Status   Specimen Description   Final    BLOOD SPECIMEN SOURCE NOT MARKED ON REQUISITION Performed at [facility] [address] [place], [place] [address]    Special Requests   Final    BOTTLES DRAWN AEROBIC ONLY Blood Culture adequate volume Performed at [facility] [address] [place], [place] [address]    Culture   Final    NO GROWTH 3 DAYS Performed at [facility], [place] [address], [place], [place] [address]    Report Status PENDING  Incomplete  Culture, blood (routine x 2)     Status: Abnormal   Collection Time: 09/09/19   3:00 PM   Specimen: BLOOD  Result Value Ref Range Status   Specimen Description   Final    BLOOD LEFT ANTECUBITAL Performed at [facility] [address] [place], [place] [address]    Special Requests   Final    BOTTLES DRAWN AEROBIC ONLY Blood Culture adequate volume Performed at [facility] [address], [place], [place] [address]    Culture  Setup Time   Final    GRAM POSITIVE COCCI IN CLUSTERS AEROBIC BOTTLE ONLY E. WILLIAMSON, PHARMD ([place])  AT 2034 ON 09/10/19 BY C. JESSUP, MT. GRAM NEGATIVE RODS CRITICAL RESULT CALLED TO, READ BACK BY AND VERIFIED WITH: PHARMD M ESPADA [id number] AT 72 BY CM    Culture (A)  Final    PSEUDOMONAS AERUGINOSA STAPHYLOCOCCUS SPECIES (COAGULASE  NEGATIVE) THE SIGNIFICANCE OF ISOLATING THIS ORGANISM FROM A SINGLE SET OF BLOOD CULTURES WHEN MULTIPLE SETS ARE DRAWN IS UNCERTAIN. PLEASE NOTIFY THE MICROBIOLOGY DEPARTMENT WITHIN ONE WEEK IF SPECIATION AND SENSITIVITIES ARE REQUIRED. Performed at [facility], [place] [address], [place], [place] [address]    Report Status 09/12/2019 FINAL  Final   Organism ID, Bacteria PSEUDOMONAS AERUGINOSA  Final      Susceptibility   Pseudomonas aeruginosa - MIC*    CEFTAZIDIME 2 SENSITIVE Sensitive     CIPROFLOXACIN <=0.25 SENSITIVE Sensitive     GENTAMICIN <=1 SENSITIVE Sensitive     IMIPENEM 1 SENSITIVE Sensitive     PIP/TAZO 8 SENSITIVE Sensitive     CEFEPIME 2 SENSITIVE Sensitive     * PSEUDOMONAS AERUGINOSA    Lab Basic Metabolic Panel: Recent Labs  Lab  0000 09/06/19 0222 09/06/19 0222 09/07/19 0208 09/08/19 0033 09/09/19 1500 09/09/19 1617 09/09/19 2340 09/10/19 0158 09/10/19 0430 09/10/19 0431 09/10/19 1109 09/10/19 1612 09/10/19 1612 09/10/19 1758 09/10/19 1758 09/10/19 1953 09/10/19 2011 16-Sep-2019 0508 09/16/2019 0951 09/16/2019 1030  NA   < > 140   < > 138   < > 143   < > 143   < > 143  --   --  137   < > 136  --   --  136 139 134* 136  K   --  5.9*   < > 6.1*   < > 7.5*   < > 5.3*  5.3*   < > 5.1  --    < > 5.2*   < > 5.3*   < > 5.6* 5.6* 5.3* 6.4* 6.5*  CL  --  106   < > 104   < > 104   < > 108  --  106  --   --  103  --   --   --   --   --  104  --  101  CO2  --  26   < > 24   < > 27   < > 28  --  27  --   --  26  --   --   --   --   --  20*  --  13*  GLUCOSE  --  145*   < > 236*   < > 255*   < > 131*  --  61*  --   --  83  --   --   --   --   --  98  --  105*  BUN  --  102*   < > 113*   < > 90*   < > 96*  --  85*  --   --  63*  --   --   --   --   --  50*  --  45*  CREATININE  --  2.33*   < > 2.37*   < > 2.97*   < > 2.15*  --  1.85*  --   --  1.80*  --   --   --   --   --  1.94*  --  2.07*  CALCIUM  --  7.4*   < > 7.2*   < > 7.9*   < > 7.5*  --  7.6*  --   --  6.9*  --   --   --   --   --  6.8*  --  7.0*  MG  --  3.0*  --  3.4*  --   --   --   --   --   --  3.0*  --   --   --   --   --   --   --  2.9*  --   --   PHOS  --   --   --   --   --  9.5*  --  5.7*  --  5.1*  --   --  5.5*  --   --   --   --   --  7.3*  --   --    < > = values in this interval not displayed.   Liver Function Tests: Recent Labs  Lab 09/09/19 2340 09/10/19 0430 09/10/19 1612 09/25/2019 0508 25-Sep-2019 1030  AST  --   --   --   --  >10,000*  ALT  --   --   --   --  4,157*  ALKPHOS  --   --   --   --  278*  BILITOT  --   --   --   --  2.4*  PROT  --   --   --   --  3.9*  ALBUMIN 2.1* 2.3* 1.8* 1.7* 1.3*   No results for input(s): LIPASE, AMYLASE in the last 168 hours. No results for input(s): AMMONIA in the last 168 hours. CBC: Recent Labs  Lab 09/09/19 0203 09/09/19 0203 09/09/19 1500 09/09/19 1617 09/10/19 0431 09/10/19 0431 09/10/19 1758 09/10/19 2011 09/25/2019 0508 Sep 25, 2019 0951 Sep 25, 2019 1030  WBC 48.1*  --  25.9*  --  45.4*  --   --   --  37.0*  --  39.3*  NEUTROABS  --   --  24.9*  --   --   --   --   --   --   --   --   HGB 11.2*   < > 8.9*   < > 10.0*   < > 9.2* 9.2* 8.4* 7.1* 7.3*  HCT 37.2*   < > 29.4*   < > 34.3*   < > 27.0*  27.0* 29.8* 21.0* 27.1*  MCV 90.7  --  90.7  --  92.0  --   --   --  95.5  --  100.7*  PLT 259  --  128*  --  161  --   --   --  101*  --  97*   < > = values in this interval not displayed.   Cardiac Enzymes: No results for input(s): CKTOTAL, CKMB, CKMBINDEX, TROPONINI in the last 168 hours. Sepsis Labs: Recent Labs  Lab 09/09/19 0203 09/09/19 1200 09/09/19 1500 09/10/19 0431 2019/09/25 0508 09-25-19 1030  WBC   < >  --  25.9* 45.4* 37.0* 39.3*  LATICACIDVEN  --  1.5 2.5*  --   --  >11.0*   < > = values in this interval not displayed.    Procedures/Operations  Intubation 2/13 trialysis 2/13   Roselie Awkward 09/12/2019, 6:45 PM

## 2019-09-25 NOTE — Progress Notes (Signed)
OT Cancellation Note  Patient Details Name: Michael [name] MRN: [id number] DOB: 03/04/1941   Cancelled Treatment:    Reason Eval/Treat Not Completed: Medical issues which prohibited therapy(Intubated and sedated). Will return as medically ready. Thank you.  [place], OTR/L Acute Rehab Pager: [phone] Office: [phone] Oct 11, 2019, 10:49 AM

## 2019-09-25 NOTE — Progress Notes (Signed)
Cloth tape and protective barriers removed from pt face. ETT holister placed and tube resecured at 25cm at the lip. RT will continue to monitor.

## 2019-09-25 NOTE — Progress Notes (Signed)
PT Cancellation Note  Patient Details Name: Michael [name] MRN: [id number] DOB: [birth year]   Cancelled Treatment:    Reason Eval/Treat Not Completed: Medical issues which prohibited therapy.  Significant medical decline since last PT session (intubated, sedated, proned).  Pt's vent settings too high for PT mobility.  PT will follow along acutely to see if pt stabilizes.   Thanks,  Verdene Lennert, PT, DPT  Acute Rehabilitation [phone] pager [phone] office  @ [facility]: [phone]     Harvie Heck 09/27/2019, 10:43 AM

## 2019-09-25 NOTE — Progress Notes (Signed)
Pt transported on ventilator with RT and RN x2 to family comfort room for a scheduled visit. Pt VSS throughout. Will transition to comfort care orders. RT will continue to monitor.

## 2019-09-25 NOTE — Procedures (Signed)
Extubation Procedure Note  Patient Details:   Name: Michael [name] DOB: March 15, 1941 MRN: [id number]   Airway Documentation:    Vent end date: 09/18/2019 Vent end time: 1222   Evaluation  O2 sats: currently acceptable Complications: No apparent complications Patient did tolerate procedure well. Bilateral Breath Sounds: Diminished, Rhonchi   No   Pt extubated to comfort care per physician order/post family visit.   Sharla Kidney Sep 18, 2019, 12:29 PM

## 2019-09-25 NOTE — Progress Notes (Signed)
[facility] PCCM  Family has visited the patient  Plan terminal extubation now Morphine infusion ordered  Roselie Awkward, MD [facility] P[facility]M Pager: [phone] Cell: [phone] If no response, call [phone]

## 2019-09-25 NOTE — Progress Notes (Signed)
Patient placed in prone position.  ETT secured with cloth tape at 25 cm at the lip.  HEAD turned to the RIGHT. Patient tolerated well with no complications.

## 2019-09-25 NOTE — Progress Notes (Signed)
Nutrition Brief Note  Chart reviewed. Pt now transitioning to comfort care.  No further nutrition interventions warranted at this time.  Please re-consult as needed.   Molli Barrows, RD, LDN, CNSC Please refer to [facility] for contact information.

## 2019-09-25 NOTE — Progress Notes (Signed)
Patient declared dead by Thayer Ohm RN and Darrick Grinder RN at [phone]. No breath or heart sounds auscultated. Thayer Ohm D

## 2019-09-25 DEATH — deceased
